# Patient Record
Sex: Female | Born: 1965 | Race: Black or African American | Hispanic: No | Marital: Single | Smoking: Former smoker
Health system: Southern US, Community
[De-identification: ages and names within clinical notes are randomized; demographics above are authoritative.]

## PROBLEM LIST (undated history)

## (undated) DIAGNOSIS — I214 Non-ST elevation (NSTEMI) myocardial infarction: Secondary | ICD-10-CM

## (undated) DIAGNOSIS — D649 Anemia, unspecified: Secondary | ICD-10-CM

## (undated) DIAGNOSIS — I420 Dilated cardiomyopathy: Secondary | ICD-10-CM

## (undated) DIAGNOSIS — Q054 Unspecified spina bifida with hydrocephalus: Secondary | ICD-10-CM

## (undated) DIAGNOSIS — I5043 Acute on chronic combined systolic (congestive) and diastolic (congestive) heart failure: Secondary | ICD-10-CM

## (undated) DIAGNOSIS — I639 Cerebral infarction, unspecified: Secondary | ICD-10-CM

## (undated) DIAGNOSIS — Z91199 Patient's noncompliance with other medical treatment and regimen due to unspecified reason: Secondary | ICD-10-CM

## (undated) DIAGNOSIS — Z9119 Patient's noncompliance with other medical treatment and regimen: Secondary | ICD-10-CM

## (undated) DIAGNOSIS — L659 Nonscarring hair loss, unspecified: Secondary | ICD-10-CM

## (undated) DIAGNOSIS — N939 Abnormal uterine and vaginal bleeding, unspecified: Secondary | ICD-10-CM

## (undated) DIAGNOSIS — I10 Essential (primary) hypertension: Secondary | ICD-10-CM

## (undated) DIAGNOSIS — E785 Hyperlipidemia, unspecified: Secondary | ICD-10-CM

## (undated) DIAGNOSIS — E876 Hypokalemia: Secondary | ICD-10-CM

## (undated) DIAGNOSIS — I5042 Chronic combined systolic (congestive) and diastolic (congestive) heart failure: Secondary | ICD-10-CM

## (undated) DIAGNOSIS — I209 Angina pectoris, unspecified: Secondary | ICD-10-CM

## (undated) DIAGNOSIS — I428 Other cardiomyopathies: Secondary | ICD-10-CM

## (undated) DIAGNOSIS — Z720 Tobacco use: Secondary | ICD-10-CM

## (undated) DIAGNOSIS — R7989 Other specified abnormal findings of blood chemistry: Secondary | ICD-10-CM

## (undated) DIAGNOSIS — O903 Peripartum cardiomyopathy: Secondary | ICD-10-CM

## (undated) DIAGNOSIS — G4730 Sleep apnea, unspecified: Secondary | ICD-10-CM

## (undated) DIAGNOSIS — N920 Excessive and frequent menstruation with regular cycle: Secondary | ICD-10-CM

## (undated) DIAGNOSIS — D219 Benign neoplasm of connective and other soft tissue, unspecified: Secondary | ICD-10-CM

## (undated) HISTORY — PX: TIBIAL TUBERCLERPLASTY: SHX5953

## (undated) HISTORY — DX: Essential (primary) hypertension: I10

## (undated) HISTORY — PX: INTRAUTERINE DEVICE (IUD) INSERTION: SHX5877

## (undated) HISTORY — DX: Hyperlipidemia, unspecified: E78.5

## (undated) HISTORY — DX: Other cardiomyopathies: I42.8

## (undated) HISTORY — DX: Unspecified spina bifida with hydrocephalus: Q05.4

## (undated) HISTORY — DX: Chronic combined systolic (congestive) and diastolic (congestive) heart failure: I50.42

## (undated) HISTORY — PX: OTHER SURGICAL HISTORY: SHX169

## (undated) HISTORY — DX: Tobacco use: Z72.0

## (undated) HISTORY — DX: Abnormal uterine and vaginal bleeding, unspecified: N93.9

## (undated) HISTORY — DX: Sleep apnea, unspecified: G47.30

## (undated) HISTORY — DX: Acute on chronic combined systolic (congestive) and diastolic (congestive) heart failure: I50.43

## (undated) HISTORY — DX: Hypokalemia: E87.6

## (undated) HISTORY — DX: Other specified abnormal findings of blood chemistry: R79.89

## (undated) HISTORY — DX: Dilated cardiomyopathy: I42.0

## (undated) HISTORY — PX: LOOP RECORDER IMPLANT: SHX5954

---

## 1992-05-24 DIAGNOSIS — O903 Peripartum cardiomyopathy: Secondary | ICD-10-CM

## 1992-05-24 HISTORY — DX: Peripartum cardiomyopathy: O90.3

## 1992-05-24 HISTORY — PX: TUBAL LIGATION: SHX77

## 1998-12-03 ENCOUNTER — Emergency Department (HOSPITAL_COMMUNITY): Admission: EM | Admit: 1998-12-03 | Discharge: 1998-12-03 | Payer: Self-pay | Admitting: Emergency Medicine

## 1999-02-01 ENCOUNTER — Emergency Department (HOSPITAL_COMMUNITY): Admission: EM | Admit: 1999-02-01 | Discharge: 1999-02-01 | Payer: Self-pay | Admitting: Emergency Medicine

## 1999-05-29 ENCOUNTER — Emergency Department (HOSPITAL_COMMUNITY): Admission: EM | Admit: 1999-05-29 | Discharge: 1999-05-29 | Payer: Self-pay | Admitting: Emergency Medicine

## 1999-06-02 ENCOUNTER — Emergency Department (HOSPITAL_COMMUNITY): Admission: EM | Admit: 1999-06-02 | Discharge: 1999-06-02 | Payer: Self-pay | Admitting: Emergency Medicine

## 1999-06-23 ENCOUNTER — Emergency Department (HOSPITAL_COMMUNITY): Admission: EM | Admit: 1999-06-23 | Discharge: 1999-06-23 | Payer: Self-pay | Admitting: *Deleted

## 1999-06-23 ENCOUNTER — Encounter: Payer: Self-pay | Admitting: *Deleted

## 1999-06-25 ENCOUNTER — Emergency Department (HOSPITAL_COMMUNITY): Admission: EM | Admit: 1999-06-25 | Discharge: 1999-06-25 | Payer: Self-pay | Admitting: Emergency Medicine

## 1999-08-22 ENCOUNTER — Emergency Department (HOSPITAL_COMMUNITY): Admission: EM | Admit: 1999-08-22 | Discharge: 1999-08-22 | Payer: Self-pay | Admitting: Emergency Medicine

## 1999-08-25 ENCOUNTER — Emergency Department (HOSPITAL_COMMUNITY): Admission: EM | Admit: 1999-08-25 | Discharge: 1999-08-25 | Payer: Self-pay | Admitting: Emergency Medicine

## 2000-02-17 ENCOUNTER — Emergency Department (HOSPITAL_COMMUNITY): Admission: EM | Admit: 2000-02-17 | Discharge: 2000-02-17 | Payer: Self-pay | Admitting: Emergency Medicine

## 2000-02-21 ENCOUNTER — Emergency Department (HOSPITAL_COMMUNITY): Admission: EM | Admit: 2000-02-21 | Discharge: 2000-02-21 | Payer: Self-pay | Admitting: Emergency Medicine

## 2000-05-28 ENCOUNTER — Emergency Department (HOSPITAL_COMMUNITY): Admission: EM | Admit: 2000-05-28 | Discharge: 2000-05-28 | Payer: Self-pay | Admitting: Emergency Medicine

## 2000-10-29 ENCOUNTER — Encounter: Payer: Self-pay | Admitting: Emergency Medicine

## 2000-10-29 ENCOUNTER — Emergency Department (HOSPITAL_COMMUNITY): Admission: EM | Admit: 2000-10-29 | Discharge: 2000-10-29 | Payer: Self-pay | Admitting: Emergency Medicine

## 2000-10-31 ENCOUNTER — Emergency Department (HOSPITAL_COMMUNITY): Admission: EM | Admit: 2000-10-31 | Discharge: 2000-11-01 | Payer: Self-pay | Admitting: Internal Medicine

## 2000-10-31 ENCOUNTER — Encounter: Payer: Self-pay | Admitting: Emergency Medicine

## 2000-12-13 ENCOUNTER — Encounter: Payer: Self-pay | Admitting: Emergency Medicine

## 2000-12-13 ENCOUNTER — Encounter: Payer: Self-pay | Admitting: Cardiology

## 2000-12-13 ENCOUNTER — Inpatient Hospital Stay (HOSPITAL_COMMUNITY): Admission: EM | Admit: 2000-12-13 | Discharge: 2000-12-15 | Payer: Self-pay | Admitting: Emergency Medicine

## 2000-12-14 ENCOUNTER — Encounter: Payer: Self-pay | Admitting: Cardiology

## 2000-12-15 ENCOUNTER — Encounter: Payer: Self-pay | Admitting: Cardiology

## 2000-12-20 ENCOUNTER — Encounter: Payer: Self-pay | Admitting: Cardiology

## 2000-12-20 ENCOUNTER — Ambulatory Visit (HOSPITAL_COMMUNITY): Admission: AD | Admit: 2000-12-20 | Discharge: 2000-12-20 | Payer: Self-pay | Admitting: Cardiology

## 2000-12-30 ENCOUNTER — Ambulatory Visit (HOSPITAL_COMMUNITY): Admission: RE | Admit: 2000-12-30 | Discharge: 2000-12-30 | Payer: Self-pay | Admitting: Internal Medicine

## 2001-01-13 ENCOUNTER — Ambulatory Visit (HOSPITAL_COMMUNITY): Admission: RE | Admit: 2001-01-13 | Discharge: 2001-01-13 | Payer: Self-pay | Admitting: Internal Medicine

## 2001-07-25 ENCOUNTER — Emergency Department (HOSPITAL_COMMUNITY): Admission: EM | Admit: 2001-07-25 | Discharge: 2001-07-25 | Payer: Self-pay | Admitting: Emergency Medicine

## 2001-07-25 ENCOUNTER — Encounter: Payer: Self-pay | Admitting: Emergency Medicine

## 2001-11-06 ENCOUNTER — Emergency Department (HOSPITAL_COMMUNITY): Admission: EM | Admit: 2001-11-06 | Discharge: 2001-11-06 | Payer: Self-pay | Admitting: Emergency Medicine

## 2002-04-27 ENCOUNTER — Emergency Department (HOSPITAL_COMMUNITY): Admission: EM | Admit: 2002-04-27 | Discharge: 2002-04-27 | Payer: Self-pay | Admitting: Emergency Medicine

## 2002-05-13 ENCOUNTER — Emergency Department (HOSPITAL_COMMUNITY): Admission: EM | Admit: 2002-05-13 | Discharge: 2002-05-13 | Payer: Self-pay | Admitting: Emergency Medicine

## 2002-11-06 ENCOUNTER — Emergency Department (HOSPITAL_COMMUNITY): Admission: EM | Admit: 2002-11-06 | Discharge: 2002-11-06 | Payer: Self-pay | Admitting: Emergency Medicine

## 2003-01-27 ENCOUNTER — Emergency Department (HOSPITAL_COMMUNITY): Admission: EM | Admit: 2003-01-27 | Discharge: 2003-01-27 | Payer: Self-pay | Admitting: Emergency Medicine

## 2003-02-03 ENCOUNTER — Encounter: Payer: Self-pay | Admitting: Internal Medicine

## 2003-02-03 ENCOUNTER — Encounter: Payer: Self-pay | Admitting: Emergency Medicine

## 2003-02-03 ENCOUNTER — Inpatient Hospital Stay (HOSPITAL_COMMUNITY): Admission: EM | Admit: 2003-02-03 | Discharge: 2003-02-04 | Payer: Self-pay | Admitting: Internal Medicine

## 2003-02-04 ENCOUNTER — Encounter: Payer: Self-pay | Admitting: Cardiology

## 2003-02-06 ENCOUNTER — Ambulatory Visit (HOSPITAL_COMMUNITY): Admission: RE | Admit: 2003-02-06 | Discharge: 2003-02-07 | Payer: Self-pay | Admitting: Internal Medicine

## 2003-03-25 ENCOUNTER — Ambulatory Visit (HOSPITAL_COMMUNITY): Admission: RE | Admit: 2003-03-25 | Discharge: 2003-03-25 | Payer: Self-pay | Admitting: Internal Medicine

## 2003-04-02 ENCOUNTER — Inpatient Hospital Stay (HOSPITAL_COMMUNITY): Admission: EM | Admit: 2003-04-02 | Discharge: 2003-04-03 | Payer: Self-pay | Admitting: Emergency Medicine

## 2003-05-09 ENCOUNTER — Emergency Department (HOSPITAL_COMMUNITY): Admission: AD | Admit: 2003-05-09 | Discharge: 2003-05-09 | Payer: Self-pay | Admitting: Family Medicine

## 2003-08-25 ENCOUNTER — Emergency Department (HOSPITAL_COMMUNITY): Admission: AD | Admit: 2003-08-25 | Discharge: 2003-08-25 | Payer: Self-pay | Admitting: Family Medicine

## 2003-11-13 ENCOUNTER — Emergency Department (HOSPITAL_COMMUNITY): Admission: EM | Admit: 2003-11-13 | Discharge: 2003-11-13 | Payer: Self-pay | Admitting: Family Medicine

## 2004-03-01 ENCOUNTER — Emergency Department (HOSPITAL_COMMUNITY): Admission: EM | Admit: 2004-03-01 | Discharge: 2004-03-01 | Payer: Self-pay | Admitting: *Deleted

## 2004-03-01 ENCOUNTER — Emergency Department (HOSPITAL_COMMUNITY): Admission: EM | Admit: 2004-03-01 | Discharge: 2004-03-01 | Payer: Self-pay | Admitting: Emergency Medicine

## 2004-03-29 ENCOUNTER — Emergency Department (HOSPITAL_COMMUNITY): Admission: EM | Admit: 2004-03-29 | Discharge: 2004-03-29 | Payer: Self-pay | Admitting: Emergency Medicine

## 2004-07-27 ENCOUNTER — Emergency Department (HOSPITAL_COMMUNITY): Admission: EM | Admit: 2004-07-27 | Discharge: 2004-07-27 | Payer: Self-pay | Admitting: Emergency Medicine

## 2004-07-28 ENCOUNTER — Ambulatory Visit: Payer: Self-pay | Admitting: Internal Medicine

## 2004-08-06 ENCOUNTER — Ambulatory Visit: Payer: Self-pay

## 2004-09-07 ENCOUNTER — Emergency Department (HOSPITAL_COMMUNITY): Admission: EM | Admit: 2004-09-07 | Discharge: 2004-09-07 | Payer: Self-pay | Admitting: *Deleted

## 2004-12-10 ENCOUNTER — Emergency Department (HOSPITAL_COMMUNITY): Admission: EM | Admit: 2004-12-10 | Discharge: 2004-12-10 | Payer: Self-pay | Admitting: Emergency Medicine

## 2005-04-06 ENCOUNTER — Emergency Department (HOSPITAL_COMMUNITY): Admission: EM | Admit: 2005-04-06 | Discharge: 2005-04-06 | Payer: Self-pay | Admitting: Emergency Medicine

## 2005-05-08 ENCOUNTER — Emergency Department (HOSPITAL_COMMUNITY): Admission: EM | Admit: 2005-05-08 | Discharge: 2005-05-08 | Payer: Self-pay | Admitting: Emergency Medicine

## 2005-06-30 ENCOUNTER — Emergency Department (HOSPITAL_COMMUNITY): Admission: EM | Admit: 2005-06-30 | Discharge: 2005-06-30 | Payer: Self-pay | Admitting: Emergency Medicine

## 2005-07-15 ENCOUNTER — Emergency Department (HOSPITAL_COMMUNITY): Admission: EM | Admit: 2005-07-15 | Discharge: 2005-07-15 | Payer: Self-pay | Admitting: Emergency Medicine

## 2005-09-02 ENCOUNTER — Emergency Department (HOSPITAL_COMMUNITY): Admission: EM | Admit: 2005-09-02 | Discharge: 2005-09-02 | Payer: Self-pay | Admitting: Family Medicine

## 2005-10-29 ENCOUNTER — Ambulatory Visit: Payer: Self-pay | Admitting: Internal Medicine

## 2005-11-08 ENCOUNTER — Emergency Department (HOSPITAL_COMMUNITY): Admission: EM | Admit: 2005-11-08 | Discharge: 2005-11-08 | Payer: Self-pay | Admitting: Family Medicine

## 2005-12-13 ENCOUNTER — Emergency Department (HOSPITAL_COMMUNITY): Admission: EM | Admit: 2005-12-13 | Discharge: 2005-12-13 | Payer: Self-pay | Admitting: Emergency Medicine

## 2006-01-11 ENCOUNTER — Emergency Department (HOSPITAL_COMMUNITY): Admission: EM | Admit: 2006-01-11 | Discharge: 2006-01-12 | Payer: Self-pay | Admitting: Emergency Medicine

## 2006-02-24 ENCOUNTER — Emergency Department (HOSPITAL_COMMUNITY): Admission: EM | Admit: 2006-02-24 | Discharge: 2006-02-24 | Payer: Self-pay | Admitting: Family Medicine

## 2006-03-07 ENCOUNTER — Emergency Department (HOSPITAL_COMMUNITY): Admission: EM | Admit: 2006-03-07 | Discharge: 2006-03-07 | Payer: Self-pay | Admitting: Emergency Medicine

## 2006-04-07 ENCOUNTER — Emergency Department (HOSPITAL_COMMUNITY): Admission: EM | Admit: 2006-04-07 | Discharge: 2006-04-07 | Payer: Self-pay | Admitting: Emergency Medicine

## 2006-05-29 ENCOUNTER — Emergency Department (HOSPITAL_COMMUNITY): Admission: EM | Admit: 2006-05-29 | Discharge: 2006-05-29 | Payer: Self-pay | Admitting: Emergency Medicine

## 2006-07-16 ENCOUNTER — Emergency Department (HOSPITAL_COMMUNITY): Admission: EM | Admit: 2006-07-16 | Discharge: 2006-07-16 | Payer: Self-pay | Admitting: *Deleted

## 2006-07-27 ENCOUNTER — Emergency Department (HOSPITAL_COMMUNITY): Admission: EM | Admit: 2006-07-27 | Discharge: 2006-07-27 | Payer: Self-pay | Admitting: Family Medicine

## 2006-11-20 ENCOUNTER — Emergency Department (HOSPITAL_COMMUNITY): Admission: EM | Admit: 2006-11-20 | Discharge: 2006-11-20 | Payer: Self-pay | Admitting: Emergency Medicine

## 2007-01-14 ENCOUNTER — Emergency Department (HOSPITAL_COMMUNITY): Admission: EM | Admit: 2007-01-14 | Discharge: 2007-01-14 | Payer: Self-pay | Admitting: Emergency Medicine

## 2007-01-19 ENCOUNTER — Emergency Department (HOSPITAL_COMMUNITY): Admission: EM | Admit: 2007-01-19 | Discharge: 2007-01-19 | Payer: Self-pay | Admitting: Emergency Medicine

## 2007-02-03 ENCOUNTER — Ambulatory Visit: Payer: Self-pay | Admitting: Internal Medicine

## 2007-02-10 ENCOUNTER — Encounter: Payer: Self-pay | Admitting: Internal Medicine

## 2007-02-10 ENCOUNTER — Ambulatory Visit: Payer: Self-pay

## 2007-08-24 ENCOUNTER — Emergency Department (HOSPITAL_COMMUNITY): Admission: EM | Admit: 2007-08-24 | Discharge: 2007-08-24 | Payer: Self-pay | Admitting: Emergency Medicine

## 2007-08-28 ENCOUNTER — Emergency Department (HOSPITAL_COMMUNITY): Admission: EM | Admit: 2007-08-28 | Discharge: 2007-08-28 | Payer: Self-pay | Admitting: Emergency Medicine

## 2007-09-27 ENCOUNTER — Emergency Department (HOSPITAL_COMMUNITY): Admission: EM | Admit: 2007-09-27 | Discharge: 2007-09-27 | Payer: Self-pay | Admitting: Emergency Medicine

## 2008-02-27 ENCOUNTER — Emergency Department (HOSPITAL_COMMUNITY): Admission: EM | Admit: 2008-02-27 | Discharge: 2008-02-27 | Payer: Self-pay | Admitting: Emergency Medicine

## 2008-03-06 ENCOUNTER — Emergency Department (HOSPITAL_COMMUNITY): Admission: EM | Admit: 2008-03-06 | Discharge: 2008-03-06 | Payer: Self-pay | Admitting: Emergency Medicine

## 2008-08-08 ENCOUNTER — Emergency Department (HOSPITAL_COMMUNITY): Admission: EM | Admit: 2008-08-08 | Discharge: 2008-08-08 | Payer: Self-pay | Admitting: Emergency Medicine

## 2008-09-06 ENCOUNTER — Telehealth (INDEPENDENT_AMBULATORY_CARE_PROVIDER_SITE_OTHER): Payer: Self-pay | Admitting: *Deleted

## 2008-09-30 ENCOUNTER — Emergency Department (HOSPITAL_COMMUNITY): Admission: EM | Admit: 2008-09-30 | Discharge: 2008-09-30 | Payer: Self-pay | Admitting: Family Medicine

## 2008-10-13 ENCOUNTER — Emergency Department (HOSPITAL_COMMUNITY): Admission: EM | Admit: 2008-10-13 | Discharge: 2008-10-13 | Payer: Self-pay | Admitting: Emergency Medicine

## 2008-10-15 DIAGNOSIS — I10 Essential (primary) hypertension: Secondary | ICD-10-CM | POA: Insufficient documentation

## 2008-10-15 DIAGNOSIS — I080 Rheumatic disorders of both mitral and aortic valves: Secondary | ICD-10-CM

## 2008-10-23 ENCOUNTER — Encounter (INDEPENDENT_AMBULATORY_CARE_PROVIDER_SITE_OTHER): Payer: Self-pay | Admitting: *Deleted

## 2008-11-10 ENCOUNTER — Emergency Department (HOSPITAL_COMMUNITY): Admission: EM | Admit: 2008-11-10 | Discharge: 2008-11-10 | Payer: Self-pay | Admitting: Emergency Medicine

## 2008-12-12 ENCOUNTER — Telehealth: Payer: Self-pay | Admitting: Internal Medicine

## 2008-12-13 ENCOUNTER — Ambulatory Visit: Payer: Self-pay

## 2009-01-08 ENCOUNTER — Encounter (INDEPENDENT_AMBULATORY_CARE_PROVIDER_SITE_OTHER): Payer: Self-pay | Admitting: *Deleted

## 2009-01-22 ENCOUNTER — Emergency Department (HOSPITAL_COMMUNITY): Admission: EM | Admit: 2009-01-22 | Discharge: 2009-01-22 | Payer: Self-pay | Admitting: Emergency Medicine

## 2009-02-18 ENCOUNTER — Emergency Department (HOSPITAL_COMMUNITY): Admission: EM | Admit: 2009-02-18 | Discharge: 2009-02-18 | Payer: Self-pay | Admitting: Family Medicine

## 2009-02-27 ENCOUNTER — Emergency Department (HOSPITAL_COMMUNITY): Admission: EM | Admit: 2009-02-27 | Discharge: 2009-02-27 | Payer: Self-pay | Admitting: Emergency Medicine

## 2009-05-24 DIAGNOSIS — I639 Cerebral infarction, unspecified: Secondary | ICD-10-CM

## 2009-05-24 HISTORY — DX: Cerebral infarction, unspecified: I63.9

## 2009-09-01 ENCOUNTER — Telehealth: Payer: Self-pay | Admitting: Internal Medicine

## 2009-09-06 ENCOUNTER — Observation Stay (HOSPITAL_COMMUNITY): Admission: EM | Admit: 2009-09-06 | Discharge: 2009-09-07 | Payer: Self-pay | Admitting: Emergency Medicine

## 2009-09-06 ENCOUNTER — Encounter (INDEPENDENT_AMBULATORY_CARE_PROVIDER_SITE_OTHER): Payer: Self-pay | Admitting: Internal Medicine

## 2009-09-07 ENCOUNTER — Encounter (INDEPENDENT_AMBULATORY_CARE_PROVIDER_SITE_OTHER): Payer: Self-pay | Admitting: Internal Medicine

## 2009-09-07 DIAGNOSIS — E876 Hypokalemia: Secondary | ICD-10-CM

## 2009-09-13 ENCOUNTER — Emergency Department (HOSPITAL_COMMUNITY): Admission: EM | Admit: 2009-09-13 | Discharge: 2009-09-13 | Payer: Self-pay | Admitting: Emergency Medicine

## 2009-10-06 ENCOUNTER — Ambulatory Visit: Payer: Self-pay | Admitting: Internal Medicine

## 2009-10-06 DIAGNOSIS — I509 Heart failure, unspecified: Secondary | ICD-10-CM | POA: Insufficient documentation

## 2009-10-07 LAB — CONVERTED CEMR LAB
BUN: 8 mg/dL (ref 6–23)
CO2: 30 meq/L (ref 19–32)
Calcium: 9 mg/dL (ref 8.4–10.5)
Creatinine, Ser: 0.7 mg/dL (ref 0.4–1.2)
Glucose, Bld: 84 mg/dL (ref 70–99)
Sodium: 142 meq/L (ref 135–145)

## 2009-10-24 ENCOUNTER — Encounter: Payer: Self-pay | Admitting: Internal Medicine

## 2009-10-24 ENCOUNTER — Ambulatory Visit: Payer: Self-pay | Admitting: Cardiovascular Disease

## 2009-10-24 ENCOUNTER — Ambulatory Visit: Payer: Self-pay

## 2009-10-24 ENCOUNTER — Ambulatory Visit (HOSPITAL_COMMUNITY): Admission: RE | Admit: 2009-10-24 | Discharge: 2009-10-24 | Payer: Self-pay | Admitting: Internal Medicine

## 2009-10-29 ENCOUNTER — Encounter (INDEPENDENT_AMBULATORY_CARE_PROVIDER_SITE_OTHER): Payer: Self-pay | Admitting: *Deleted

## 2009-10-29 ENCOUNTER — Telehealth: Payer: Self-pay | Admitting: Internal Medicine

## 2009-11-02 ENCOUNTER — Emergency Department (HOSPITAL_COMMUNITY): Admission: EM | Admit: 2009-11-02 | Discharge: 2009-11-02 | Payer: Self-pay | Admitting: Emergency Medicine

## 2009-11-03 ENCOUNTER — Telehealth: Payer: Self-pay | Admitting: Internal Medicine

## 2009-11-04 ENCOUNTER — Telehealth: Payer: Self-pay | Admitting: Internal Medicine

## 2009-11-07 ENCOUNTER — Encounter: Payer: Self-pay | Admitting: Internal Medicine

## 2009-11-13 ENCOUNTER — Telehealth: Payer: Self-pay | Admitting: Internal Medicine

## 2009-11-16 ENCOUNTER — Emergency Department (HOSPITAL_COMMUNITY): Admission: EM | Admit: 2009-11-16 | Discharge: 2009-11-16 | Payer: Self-pay | Admitting: Family Medicine

## 2009-11-18 ENCOUNTER — Emergency Department (HOSPITAL_COMMUNITY): Admission: EM | Admit: 2009-11-18 | Discharge: 2009-11-18 | Payer: Self-pay | Admitting: Family Medicine

## 2010-01-10 ENCOUNTER — Emergency Department (HOSPITAL_COMMUNITY): Admission: EM | Admit: 2010-01-10 | Discharge: 2010-01-10 | Payer: Self-pay | Admitting: Family Medicine

## 2010-01-27 ENCOUNTER — Emergency Department (HOSPITAL_COMMUNITY): Admission: EM | Admit: 2010-01-27 | Discharge: 2010-01-27 | Payer: Self-pay | Admitting: Emergency Medicine

## 2010-02-16 ENCOUNTER — Observation Stay (HOSPITAL_COMMUNITY): Admission: EM | Admit: 2010-02-16 | Discharge: 2010-02-16 | Payer: Self-pay | Admitting: Emergency Medicine

## 2010-02-16 ENCOUNTER — Ambulatory Visit: Payer: Self-pay | Admitting: Cardiovascular Disease

## 2010-02-17 ENCOUNTER — Emergency Department (HOSPITAL_COMMUNITY): Admission: EM | Admit: 2010-02-17 | Discharge: 2010-02-17 | Payer: Self-pay | Admitting: Emergency Medicine

## 2010-02-20 ENCOUNTER — Ambulatory Visit: Payer: Self-pay | Admitting: Internal Medicine

## 2010-03-02 ENCOUNTER — Emergency Department (HOSPITAL_COMMUNITY): Admission: EM | Admit: 2010-03-02 | Discharge: 2010-03-02 | Payer: Self-pay | Admitting: Emergency Medicine

## 2010-03-02 ENCOUNTER — Ambulatory Visit: Payer: Self-pay | Admitting: Internal Medicine

## 2010-03-02 LAB — CONVERTED CEMR LAB
BUN: 6 mg/dL (ref 6–23)
Calcium: 8.7 mg/dL (ref 8.4–10.5)
Chloride: 106 meq/L (ref 96–112)
Creatinine, Ser: 0.7 mg/dL (ref 0.4–1.2)

## 2010-03-19 ENCOUNTER — Encounter (INDEPENDENT_AMBULATORY_CARE_PROVIDER_SITE_OTHER): Payer: Self-pay | Admitting: *Deleted

## 2010-03-19 LAB — CONVERTED CEMR LAB
AST: 12 units/L (ref 0–37)
Albumin: 3.7 g/dL (ref 3.5–5.2)
Alkaline Phosphatase: 77 units/L (ref 39–117)
Basophils Relative: 1 % (ref 0–1)
Eosinophils Absolute: 0.1 10*3/uL (ref 0.0–0.7)
Lymphs Abs: 3 10*3/uL (ref 0.7–4.0)
MCHC: 29.8 g/dL — ABNORMAL LOW (ref 30.0–36.0)
MCV: 70.1 fL — ABNORMAL LOW (ref 78.0–100.0)
Neutrophils Relative %: 44 % (ref 43–77)
Platelets: 308 10*3/uL (ref 150–400)
Potassium: 4.1 meq/L (ref 3.5–5.3)
Sodium: 144 meq/L (ref 135–145)
Total Protein: 6.7 g/dL (ref 6.0–8.3)
WBC: 6.3 10*3/uL (ref 4.0–10.5)

## 2010-04-27 ENCOUNTER — Emergency Department (HOSPITAL_COMMUNITY)
Admission: EM | Admit: 2010-04-27 | Discharge: 2010-04-27 | Payer: Self-pay | Source: Home / Self Care | Admitting: Emergency Medicine

## 2010-04-30 ENCOUNTER — Emergency Department (HOSPITAL_COMMUNITY): Admission: EM | Admit: 2010-04-30 | Discharge: 2010-03-14 | Payer: Self-pay | Admitting: Emergency Medicine

## 2010-05-14 ENCOUNTER — Ambulatory Visit: Payer: Self-pay | Admitting: Internal Medicine

## 2010-05-17 ENCOUNTER — Inpatient Hospital Stay (HOSPITAL_COMMUNITY): Admission: EM | Admit: 2010-05-17 | Discharge: 2010-05-19 | Payer: Self-pay | Source: Home / Self Care

## 2010-05-19 ENCOUNTER — Encounter (INDEPENDENT_AMBULATORY_CARE_PROVIDER_SITE_OTHER): Payer: Self-pay | Admitting: Internal Medicine

## 2010-05-20 ENCOUNTER — Encounter: Payer: Self-pay | Admitting: Internal Medicine

## 2010-05-29 ENCOUNTER — Encounter: Payer: Self-pay | Admitting: Internal Medicine

## 2010-06-08 ENCOUNTER — Encounter: Payer: Self-pay | Admitting: Physician Assistant

## 2010-06-08 ENCOUNTER — Ambulatory Visit
Admission: RE | Admit: 2010-06-08 | Discharge: 2010-06-08 | Payer: Self-pay | Source: Home / Self Care | Attending: Physician Assistant | Admitting: Physician Assistant

## 2010-06-08 DIAGNOSIS — Z8679 Personal history of other diseases of the circulatory system: Secondary | ICD-10-CM | POA: Insufficient documentation

## 2010-06-08 DIAGNOSIS — E785 Hyperlipidemia, unspecified: Secondary | ICD-10-CM | POA: Insufficient documentation

## 2010-06-08 DIAGNOSIS — E739 Lactose intolerance, unspecified: Secondary | ICD-10-CM | POA: Insufficient documentation

## 2010-06-08 DIAGNOSIS — Q054 Unspecified spina bifida with hydrocephalus: Secondary | ICD-10-CM | POA: Insufficient documentation

## 2010-06-08 DIAGNOSIS — I671 Cerebral aneurysm, nonruptured: Secondary | ICD-10-CM | POA: Insufficient documentation

## 2010-06-08 HISTORY — DX: Unspecified spina bifida with hydrocephalus: Q05.4

## 2010-06-09 ENCOUNTER — Telehealth: Payer: Self-pay | Admitting: Internal Medicine

## 2010-06-15 ENCOUNTER — Other Ambulatory Visit: Payer: Self-pay | Admitting: Physician Assistant

## 2010-06-15 ENCOUNTER — Ambulatory Visit
Admission: RE | Admit: 2010-06-15 | Discharge: 2010-06-15 | Payer: Self-pay | Source: Home / Self Care | Attending: Physician Assistant | Admitting: Physician Assistant

## 2010-06-16 ENCOUNTER — Telehealth: Payer: Self-pay | Admitting: Physician Assistant

## 2010-06-16 LAB — BASIC METABOLIC PANEL
BUN: 9 mg/dL (ref 6–23)
CO2: 30 mEq/L (ref 19–32)
Chloride: 106 mEq/L (ref 96–112)
Glucose, Bld: 92 mg/dL (ref 70–99)
Potassium: 3.2 mEq/L — ABNORMAL LOW (ref 3.5–5.1)

## 2010-06-23 ENCOUNTER — Ambulatory Visit: Admit: 2010-06-23 | Payer: Self-pay | Admitting: Physician Assistant

## 2010-06-23 NOTE — Progress Notes (Signed)
Summary: calling regarding medication cost  Phone Note Call from Patient Call back at Home Phone [phone]   Caller: Patient Summary of Call: Pt calling back regarding her medication  Initial call taken by: Judie Grieve,  November 04, 2009 2:49 PM  Follow-up for Phone Call        Spoke with patient.  Carvedilol is $4/month, but losartan is $42/month.  Pt states she cannot afford that.  Will D/W Dr Graciela Husbands alternatives for ARB therapy. Gypsy Balsam RN BSN  November 04, 2009 3:47 PM   Additional Follow-up for Phone Call Additional follow up Details #1::        Change Losartan to Hydralazine 10mg  three times a day and Isosorbide Mn 30mg  daily per Dr Graciela Husbands. Gypsy Balsam RN BSN  November 05, 2009 2:15 PM  Per Nicolette Bang, both of these are on $4 list.  Pt's phone disconnected, RX sent in and letter mailed. Gypsy Balsam RN BSN  November 07, 2009 12:16 PM     New/Updated Medications: HYDRALAZINE HCL 10 MG TABS (HYDRALAZINE HCL) Take one tablet by mouth three times a day ISOSORBIDE MONONITRATE CR 30 MG XR24H-TAB (ISOSORBIDE MONONITRATE) Take one tablet by mouth daily Prescriptions: ISOSORBIDE MONONITRATE CR 30 MG XR24H-TAB (ISOSORBIDE MONONITRATE) Take one tablet by mouth daily  #30 x 11   Entered by:   Optometrist BSN   Authorized by:   Nathen May, MD, [facility]   Signed by:   Gypsy Balsam RN BSN on 11/07/2009   Method used:   Electronically to        [facility] (retail)       [address]       [place], [place]  [address]       Ph: [phone]       Fax: [phone]   RxID:   [id number] HYDRALAZINE HCL 10 MG TABS (HYDRALAZINE HCL) Take one tablet by mouth three times a day  #90 x 11   Entered by:   Optometrist BSN   Authorized by:   Nathen May, MD, [facility]   Signed by:   Gypsy Balsam RN BSN on 11/07/2009   Method used:   Electronically to        [facility] (retail)       [address]       [place], [place]  [address]       Ph: [phone]       Fax: [phone]   RxID:   [id number]

## 2010-06-23 NOTE — Progress Notes (Signed)
Summary: samples  Phone Note Call from Patient Call back at Home Phone [phone]   Caller: Patient Reason for Call: Talk to Nurse Complaint: Nausea/Vomiting/Diarrhea Summary of Call: pt request samples of Hydralazine and Isosorbide...Marland KitchenMarland Kitchen states they cost after 60.00 and she can not afford that Initial call taken by: Migdalia Dk,  November 13, 2009 9:13 AM  Follow-up for Phone Call        PT AWARE  MEDS ARE EITHER 4 OR 10 DOLLAR MEDS WILL PICK UP MEDS TODAY PER PHARMACISTS LOSARTAN WAS $42.OO PT WAS CHANGED TO HYDRALAZINE DUE TO COST .PHARMACY AWARE LOSARTAN WAS STOPPED .PER  PT HAS NOT STARTED ANY MEDS AS OF TODAY . INFORMED NEEDED TO START VERBALZIED UNDERSTANDING. WILL PICKUP TODAY Follow-up by: Scherrie Bateman, LPN,  November 13, 2009 10:12 AM    Prescriptions: CARVEDILOL 6.25 MG TABS (CARVEDILOL) Take one tablet by mouth twice a day  #60 x 11   Entered by:   Scherrie Bateman, LPN   Authorized by:   Nathen May, MD, [facility]   Signed by:   Scherrie Bateman, LPN on 09/81/1914   Method used:   Electronically to        [facility] (retail)       [address]       [place], [place]  [address]       Ph: [phone]       Fax: [phone]   RxID:   [id number]

## 2010-06-23 NOTE — Progress Notes (Signed)
Summary: med changes  Phone Note Outgoing Call Call back at [facility] Phone [phone]   Call placed by: Gypsy Balsam RN BSN,  November 04, 2009 2:06 PM Summary of Call: Spoke with patient regarding echo results.  Per Dr Odessa Fleming recommendations, Metoprolol changed to Carvedilol 6.25mg  1 tablet two times a day, added Losartan 25mg  daily.  Pt's allergy to ACE-I is a cough.  Appt made to follow-up 12-09-09 with Dr Graciela Husbands.  Prescriptions sent to [facility] on [address]. Gypsy Balsam RN BSN  November 04, 2009 2:07 PM     New/Updated Medications: CARVEDILOL 6.25 MG TABS (CARVEDILOL) Take one tablet by mouth twice a day LOSARTAN POTASSIUM 25 MG TABS (LOSARTAN POTASSIUM) Take 1 tablet by mouth once a day Prescriptions: LOSARTAN POTASSIUM 25 MG TABS (LOSARTAN POTASSIUM) Take 1 tablet by mouth once a day  #30 x 11   Entered by:   Optometrist BSN   Authorized by:   Nathen May, MD, [facility]   Signed by:   Gypsy Balsam RN BSN on 11/04/2009   Method used:   Electronically to        [facility] (retail)       [address]       [place], [place]  [address]       Ph: [phone]       Fax: [phone]   RxID:   [id number] CARVEDILOL 6.25 MG TABS (CARVEDILOL) Take one tablet by mouth twice a day  #60 x 11   Entered by:   Optometrist BSN   Authorized by:   Nathen May, MD, [facility]   Signed by:   Gypsy Balsam RN BSN on 11/04/2009   Method used:   Electronically to        [facility] (retail)       [address]       [place], [place]  [address]       Ph: [phone]       Fax: [phone]   RxID:   [id number]

## 2010-06-23 NOTE — Progress Notes (Signed)
Summary: Unable to reach Patient: Mailed letter  Phone Note Outgoing Call   Summary of Call: RN attempted to Home Phone [phone] - Cricket disconnected;  Work Phone [phone] - Person answered stated wrong number; Relative listed: [phone] - telephone did not ring/no answering machine. Will mail letter to contact office. Bernita Raisin, RN, BSN  October 29, 2009 12:44 PM

## 2010-06-23 NOTE — Progress Notes (Signed)
Summary: pt calling regarding letter she got  Phone Note Call from Patient Call back at Home Phone [phone]   Caller: Patient Reason for Call: Talk to Nurse, Talk to Doctor Summary of Call: pt regarding letter she got from you regarding her echo Initial call taken by: Omer Jack,  November 03, 2009 2:59 PM  Follow-up for Phone Call        11/03/09--1730--spoke with ms Uliano who can now be reached at [phone]--in reference to letter about echo--you wanted her switched to ARB, d/c toprol and start carvedilol--? what ARB--please call pt--she has her phone back on--nt Follow-up by: Ledon Snare, RN,  November 03, 2009 5:51 PM

## 2010-06-23 NOTE — Letter (Signed)
Summary: Generic Letter  [facility], Main Office  [address]   [place], [place] [address]   Phone: [phone]  Fax: [phone]        November 07, 2009 MRN: [id number]    [facility] [address] [place], [place]  [address]    Dear Chelsea Branch,   I spoke with Dr Graciela Husbands about the cost of the Losartan prescription.  Since you won't be able to take that one, he wants you to take Hydralazine 10mg  1 tablet three times daily and Isosorbide Mn 30mg  1 tablet daily.  I called [facility] and both of these are on the $4 list.  I went ahead and sent these prescriptions in for you.  Please call me with questions.       Sincerely,  Chelsea Balsam RN BSN  This letter has been electronically signed by your physician.

## 2010-06-23 NOTE — Miscellaneous (Signed)
Summary: hosp d/c  Hospital Discharge  Date of admission: 09/06/09   Date of discharge: 09/07/09  Brief reason for admission/active problems: Pt is a 45 yo female with history of postpartum cardiomyopathy and HTN who was brought to ED by son for worsening cough x3 weeks. Pt had been complaining of SOB and 2 pillow orthopnea during this time as well. Cough likely exacerbated by recent ACEI use and postnasal drip following a URI. She was given lasix on admission after CXR showed pulmonary congestion and  Her breathing and coughed improved. She will be discharged on Lasix 20mg  daily as well as KCl daily.  Of note, pt had symptomatic pyruia and was started on cipro 500mg  by mouth twice daily for three days. Her dysuria had improved by discharge.  Followup needed: Pt will be called by outpt clinic for a followup appt in 7-10 days. She has a scheduled repeat BMET at this time. Recommend ob/gyn referral given history of menorrhagia and anemia. CBC is recommended in 1 month if ob/gyn appt not able to be scheduled within a month. Hgb at discharge 8.3  The medication and problem lists have been updated.  Please see the dictated discharge summary for details.    Patient Instructions: 1)  The outpatient clinic will call you for an appointment. It is very important that you keep this appointment. It is also very important that you follow up with your cardiologist, Dr. Graciela Husbands, on May 16 at 12 pm. 2)  We have made a few changes to your medications. Please see attached medication list.  3)  If you have any questions or  concerns prior to follow up appt, please call outpt clinic at [phone].  Appended Document: hosp d/c Prescriptions: FERROUS SULFATE 325 (65 FE) MG TABS (FERROUS SULFATE) 1 tab twice daily  #60 x 3   Entered and Authorized by:   Brooks Sailors MD   Signed by:   Brooks Sailors MD on 09/07/2009   Method used:   Handwritten   RxID:   [id number] POTASSIUM CHLORIDE 20 MEQ PACK (POTASSIUM  CHLORIDE) 2 tablets by mouth daily  #60 x 3   Entered and Authorized by:   Brooks Sailors MD   Signed by:   Brooks Sailors MD on 09/07/2009   Method used:   Handwritten   RxID:   [id number] HYDROCHLOROTHIAZIDE 12.5 MG TABS (HYDROCHLOROTHIAZIDE) 1 tablet by mouth daily  #30 x 3   Entered and Authorized by:   Brooks Sailors MD   Signed by:   Brooks Sailors MD on 09/07/2009   Method used:   Handwritten   RxID:   [id number] LASIX 20 MG TABS (FUROSEMIDE) 1 tablet by mouth daily  #30 x 3   Entered and Authorized by:   Brooks Sailors MD   Signed by:   Brooks Sailors MD on 09/07/2009   Method used:   Handwritten   RxID:   [id number] CIPROFLOXACIN HCL 500 MG TABS (CIPROFLOXACIN HCL) Please take 1 tablet twice daily for 2 days  #4 x 0   Entered and Authorized by:   Brooks Sailors MD   Signed by:   Brooks Sailors MD on 09/07/2009   Method used:   Handwritten   RxID:   [id number]

## 2010-06-23 NOTE — Letter (Signed)
Summary: Results Follow-up  [facility], Main Office  [address]   [place], [place] [address]   Phone: [phone]  Fax: [phone]     October 29, 2009    [facility] [address] [place], [place]  [address]   Dear Ms. Chapple,  We have received the results from your recent tests and have been unable to contact you.  Please call our office at the number listed above so that Dr.  Graciela Husbands or his nurse may review the results with you.    Thank you,  [facility]

## 2010-06-23 NOTE — Progress Notes (Signed)
Summary: b/p high today  Phone Note Call from Patient Call back at Home Phone [phone] Call back at [phone]   Caller: Patient Reason for Call: Talk to Nurse Summary of Call: c/o blood pressure high today ,154/90 @ [facility]. out of meds.  Initial call taken by: Lorne Skeens,  September 01, 2009 2:09 PM  Follow-up for Phone Call        SUPPLY OF MEDS PHONED INTO [facility] [address]  PT INFORMED NEEDS TO KEEP APPT WITH DR Graciela Husbands IN ORDER FOR [place] TO FILL MEDS VERBALIZED UNDERSTANDING . Follow-up by: Scherrie Bateman, LPN,  September 01, 2009 2:21 PM  Additional Follow-up for Phone Call Additional follow up Details #1::        thx Additional Follow-up by: Nathen May, MD, [facility],  September 01, 2009 5:31 PM    Prescriptions: METOPROLOL TARTRATE 50 MG TABS (METOPROLOL TARTRATE) take 1 tab two times a day  #60 x 1   Entered by:   Scherrie Bateman, LPN   Authorized by:   Nathen May, MD, [facility]   Signed by:   Scherrie Bateman, LPN on 56/43/3295   Method used:   Electronically to        [facility] (retail)       [address]       [place], [place]  [address]       Ph: [phone]       Fax: [phone]   RxID:   [id number] LISINOPRIL-HYDROCHLOROTHIAZIDE 20-12.5 MG TABS (LISINOPRIL-HYDROCHLOROTHIAZIDE) 1 tab once daily  #30 Each x 1   Entered by:   Scherrie Bateman, LPN   Authorized by:   Nathen May, MD, [facility]   Signed by:   Scherrie Bateman, LPN on 15/17/6160   Method used:   Electronically to        [facility] (retail)       [address]       [place], [place]  [address]       Ph: [phone]       Fax: [phone]   RxID:   [id number]

## 2010-06-23 NOTE — Assessment & Plan Note (Signed)
Summary: EPH/ appt is 1:L30/ gd   Visit Type:  Follow-up Primary Provider:  [facility]  CC:  no complaints.  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 Followup echoes in 2008 as well as 2004 had demonstrated normalization of LV function.  She was however hospitalized in the spring of this year and again earlier this month with shortness of breath. Left ventricular function assessments demonstrated ejection fraction of 25% or so discharge summary was reviewed  that hospitalization also demonstrated recurrence of hypokalemia and anemia. In hospital care was truncated because of the need to go take care of her sons while [facility] was [facility].  The next day she returned to the emergency room because of back pain. No specific cause is found her potassium had normalized and she was treated symptomatically with dilaudid.  she isn't treated for her cardiomyopathy with carvedilol hydralazine and nitrates. She appears not to be on an ACE inhibitor because of attributable cough. She is currently awaiting her Medicaid at which time we could potentially begin an ARB        .     Current Medications (verified): 1)  Carvedilol 6.25 Mg Tabs (Carvedilol) .... Take One Tablet By Mouth Twice A Day 2)  Lasix 20 Mg Tabs (Furosemide) .Marland Kitchen.. 1 Tablet By Mouth Daily-Hold 3)  Diclofenac Sodium 75 Mg Tbec (Diclofenac Sodium) .Marland Kitchen.. 1 Tablet By Mouth At Bedtime 4)  Hydralazine Hcl 10 Mg Tabs (Hydralazine Hcl) .... Take One Tablet By Mouth Three Times A Day 5)  Isosorbide Mononitrate Cr 30 Mg Xr24h-Tab (Isosorbide Mononitrate) .... Take One Tablet By Mouth Daily 6)  Flexeril 5 Mg Tabs (Cyclobenzaprine Hcl) .... As Needed  Allergies (verified): 1)  ! Ace Inhibitors  Past History:  Past Medical History: Last updated: 10/06/2009 peripartum  cardiomyopathy-resolved as of 2008 Hypertension Mitral regurgitation recent arm injury    Past Surgical History: Last updated: 10/15/2008  Explantation of a previously implanted loop recorder. 03/25/2003  Family History: Last updated: 10/15/2008 Family History of CVA or Stroke: Father deceased at unkown age... CVA  Social History: Last updated: 10/06/2009 Tobacco Use - Yes.  Alcohol Use - no Drug Use - no 3 children  Risk Factors: Smoking Status: current (10/15/2008)  Vital Signs:  Patient profile:   45 year old female Height:      66 inches Weight:      196 pounds BMI:     31.75 Pulse rate:   82 / minute BP sitting:   120 / 86  (left arm) Cuff size:   regular  Vitals Entered By: Hardin Negus, RMA (February 20, 2010 1:46 PM)  Physical Exam  General:  The patient was alert and oriented in no acute distress. HEENT Normal.  Neck veins were 6-7 cm , carotids were brisk.  Lungs were clear.  Heart sounds were regular without murmurs or gallops.  Abdomen was soft with active bowel sounds. There is no clubbing cyanosis ; Trace edema Skin Warm and dry    EKG  Procedure date:  02/20/2010  Findings:      sinus rhythm at 82 Intervals 0.16 6.01/2394 axis LXIII Nonspecific ST-T changes in the inferolateral leads and voltage criteria consistent with left ventricular hypertrophy  Impression & Recommendations:  Problem # 1:  PERIPARTUM CARDIOMYOPATHY POSTPARTUM PREVIOUSLY RESOLVED (ZOX-096.04) she has recurrence of her nonischemic cardiomyopathy previously intubated  to postpartum. She had 2 recent hospital visit for congestive heart failure. She is currently on beta blockers diuretics. She's had problems with hypokalemia.  For right now we will continue her diuretics. We'll plan to reassess her potassium in 2 weeks time. When she goes to her health services it in late October, hopefully having got her Medicaid card, her hydralazine nitrate combination can be  discontinued and she can be started on an ARB. Up titration of her carvedilol at that time would also be useful. I will plan to see her 6-8 weeks thereafter and further adjust her medications and potentially start her on Aldactone. She'll need close attention to her potassium  Problem # 2:  HYPOKALEMIA (ICD-276.8) potassium levels were rechecked on Tuesday they were 3.9 will plan to recheck it again in 2 weeks on her Lasix  Problem # 3:  CHF (ICD-428.0)  as above Her updated medication list for this problem includes:    Carvedilol 6.25 Mg Tabs (Carvedilol) .Marland Kitchen... Take one tablet by mouth twice a day    Furosemide 40 Mg Tabs (Furosemide) ..... Once daily    Isosorbide Mononitrate Cr 30 Mg Xr24h-tab (Isosorbide mononitrate) .Marland Kitchen... Take one tablet by mouth daily  Orders: EKG w/ Interpretation (93000)  Her updated medication list for this problem includes:    Carvedilol 6.25 Mg Tabs (Carvedilol) .Marland Kitchen... Take one tablet by mouth twice a day    Furosemide 40 Mg Tabs (Furosemide) ..... Once daily    Isosorbide Mononitrate Cr 30 Mg Xr24h-tab (Isosorbide mononitrate) .Marland Kitchen... Take one tablet by mouth daily  Problem # 4:  HYPERTENSION, UNSPECIFIED (ICD-401.9)  adequately controlled. We have room to up titrate her drugs. Her updated medication list for this problem includes:    Carvedilol 6.25 Mg Tabs (Carvedilol) .Marland Kitchen... Take one tablet by mouth twice a day    Furosemide 40 Mg Tabs (Furosemide) ..... Once daily    Hydralazine Hcl 10 Mg Tabs (Hydralazine hcl) .Marland Kitchen... Take one tablet by mouth three times a day  Her updated medication list for this problem includes:    Carvedilol 6.25 Mg Tabs (Carvedilol) .Marland Kitchen... Take one tablet by mouth twice a day    Furosemide 40 Mg Tabs (Furosemide) ..... Once daily    Hydralazine Hcl 10 Mg Tabs (Hydralazine hcl) .Marland Kitchen... Take one tablet by mouth three times a day  Patient Instructions: 1)  Your physician recommends that you schedule a follow-up appointment in: 12  weeks 2)  Your physician recommends that you return for lab work in: 2 weeks for a BMET 3)  Your physician has recommended you make the following change in your medication: Furosemide 40mg  Daily.  Prescriptions: FUROSEMIDE 40 MG TABS (FUROSEMIDE) once daily  #30 x 11   Entered by:   Claris Gladden RN   Authorized by:   Nathen May, MD, [facility]   Signed by:   Claris Gladden RN on 02/20/2010   Method used:   Electronically to        [facility] (retail)       [address]       [place], [place]  [address]       Ph: [phone]       Fax: [phone]   RxID:   [id number]

## 2010-06-23 NOTE — Assessment & Plan Note (Signed)
Summary: rov   CC:  rov/ Pt not sure if she is supposed to continue potassium.  Did not get her lasix filled due to finances.  She reports a lot of pain coming from her right hand.  .  History of Present Illness: Chelsea Branch is seen following a recent hospitalization for cough. 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 Followup echoes in 2008 as well as 2004 had demonstrated normalization of LV function.  She was recently hospitalized because of cough and shortness of breath. Her BNP initially was over 400 and decreased over her hospitalization to the 200s.   laboratories from the hospital also demonstrated a potassium of 2.8 followup 4 which is not evident.  she continues to have some cough though it was largely resolved.her shortness of breath is  Current Medications (verified): 1)  Metoprolol Tartrate 50 Mg Tabs (Metoprolol Tartrate) .... Take 1 Tab Two Times A Day 2)  Lasix 20 Mg Tabs (Furosemide) .Marland Kitchen.. 1 Tablet By Mouth Daily-Hold 3)  Hydrocodone-Acetaminophen 5-500 Mg Tabs (Hydrocodone-Acetaminophen) .Marland Kitchen.. 1 Tablet By Mouth Every 4-6hr As Needed For Pain 4)  Diclofenac Sodium 75 Mg Tbec (Diclofenac Sodium) .Marland Kitchen.. 1 Tablet By Mouth At Bedtime 5)  Ferrous Sulfate 325 (65 Fe) Mg Tabs (Ferrous Sulfate) .Marland Kitchen.. 1 Tab Twice Daily  Allergies (verified): 1)  ! Ace Inhibitors  Past History:  Past Surgical History: Last updated: 10/15/2008  Explantation of a previously implanted loop recorder. 03/25/2003  Family History: Last updated: 10/15/2008 Family History of CVA or Stroke: Father deceased at unkown age... CVA  Social History: Last updated: 10/06/2009 Tobacco Use - Yes.  Alcohol Use - no Drug Use - no 3 children  Past Medical History: peripartum cardiomyopathy-resolved as of 2008 Hypertension Mitral regurgitation recent arm injury    Social History: Tobacco Use - Yes.   Alcohol Use - no Drug Use - no 3 children  Vital Signs:  Patient profile:   45 year old female Height:      66 inches Weight:      191 pounds BMI:     30.94 Pulse rate:   88 / minute Pulse rhythm:   regular BP sitting:   162 / 98  (left arm) Cuff size:   large  Vitals Entered By: Judithe Modest CMA (Oct 06, 2009 1:34 PM)  Physical Exam  General:  The patient was alert and oriented in no acute distress. HEENT Normal.  Neck veins were flat, carotids were brisk.  Lungs were clear.  Heart sounds were regular without murmurs or gallops.  Abdomen was soft with active bowel sounds. There is no clubbing cyanosis or edema. Skin Warm and dry    EKG  Procedure date:  09/07/2009  Findings:      sinus rhythm at 64 Intervals 0.19/0.09/0.44 T Wave inversions laterally consistent with repolarization secondary to LVH  Impression & Recommendations:  Problem # 1:  HYPOKALEMIA (ICD-276.8) we'll recheck her metabolic profile today;  previously prescribed HCTZ had been discontinued Orders: TLB-BMP (Basic Metabolic Panel-BMET) (80048-METABOL)  Problem # 2:  HYPERTENSION, UNSPECIFIED (ICD-401.9) hypertension is poorly controlled. She attributes this to the pain in her hands.  Review of the blood pressures while in the hospital were in the 145/90 range. We'll need to keep a close eye on this as opposed to adjust treatments yet  Problem # 3:  PERIPARTUM CARDIOMYOPATHY POSTPARTUM PREVIOUSLY RESOLVED (ICD-674.54) her LV function was resolved as  of her last evaluation in 2008. Given her presentation with elevated BNP and shortness of breath consistent with congestive heart failure this needs to be repeated. This will also have an impact on the need for medications  Problem # 4:  CHF (ICD-428.0) continue her on her current medications and assess her left ventricular function by echo. The following medications were removed from the medication list:    Hydrochlorothiazide 12.5 Mg Tabs  (Hydrochlorothiazide) .Marland Kitchen... 1 tablet by mouth daily Her updated medication list for this problem includes:    Metoprolol Tartrate 50 Mg Tabs (Metoprolol tartrate) .Marland Kitchen... Take 1 tab two times a day    Lasix 20 Mg Tabs (Furosemide) .Marland Kitchen... 1 tablet by mouth daily-hold  Other Orders: Echocardiogram (Echo)  Patient Instructions: 1)  Your physician has requested that you have an echocardiogram.  Echocardiography is a painless test that uses sound waves to create images of your heart. It provides your doctor with information about the size and shape of your heart and how well your heart's chambers and valves are working.  This procedure takes approximately one hour. There are no restrictions for this procedure. 2)  Your physician recommends that you return for lab work today. 3)  Your physician wants you to follow-up in:12 months with Dr Graciela Husbands.   You will receive a reminder letter in the mail two months in advance. If you don't receive a letter, please call our office to schedule the follow-up appointment.

## 2010-06-23 NOTE — Miscellaneous (Signed)
Summary: Hospital admission  INTERNAL MEDICINE ADMISSION HISTORY AND PHYSICAL  Attending: Dr. Lina Sayre First Contact: Adrian Prince [phone]) Second Contact: Jackson Latino [phone])  PCP: [facility]  CC: cough, dysuria  HPI: Pt is a 45 yo female who with h/o cardiomyopathy and HTN who presents with a chief complaint of cough x3 weeks. The cough initially started 3 weeks ago and was markedly worse yesterday to the point where she couldn't go to sleep. She reports it is worse at night though she has been SOB more during the day, particularly at work ( she works on an Theatre stage manager at [employer]). She endorses 2 pillow orthopnea, maybe three, which she says is more than her usual 1-2 pillows at night. Her cough is sometimes productive with clear sputum. Denies fevers but reports recent sick contacts along with watery eyes and nasal congestion. Also endorses chest pain specifically with cough that is central and reproducible with cough. Of note, she was prescribed lisinopril/HCTZ combination for BP and had self elected to stop taking it 5 mo ago because of a cough. She recently started taking it again  ~2weeks ago and noted her cough worsened as a result.   The patient also presents with a complaint of vaginal pain. She reports dysuria, and itching in her vaginal area. Denies abdominal or back pain, as well as N/V. Denies ever having genital lesions or STIs in past. She does have a history of UTIs.  ALLERGIES: Lisinopril- cough  PAST MEDICAL HISTORY: MITRAL REGURGITATION (ICD-396.3) CARDIOMYOPATHY, SECONDARY (ICD-425.9): 2D-ECHO(01/2007): EF 55%, mild MVR and left atrial dilation. 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 Works at [employer] in [place] where she has been off recently following an accident involving her right hand and some machinery. Does not see a regular PCP. Social History: Tobacco Use - Yes.  Alcohol Use - no Drug Use - no  FAMILY HISTORY Family History of CVA or Stroke: Father deceased at unkown age... CVA. Grandmother might have had congestive heart failure per patient. Aunts with anemia (unknown).  ROS: LMP 07/25/2009. Periods generally 4 weeks apart and last 4-5 days with first two being the heaviest. On her heavy days she has to use a super plus tampon plus a heavy pad. Recent crush/burn injury to left hand at work.   VITALS: BP 141/94 HR 87 RR 14 T 98.7 O2SAT 97% on RA  PHYSICAL EXAM: General:  alert, well-developed, and cooperative to examination.   Head:  normocephalic and atraumatic.   Eyes:  vision grossly intact, pupils equal, pupils round, pupils reactive to light, no injection and anicteric.   Mouth:  pharynx pink and moist, no erythema, and no exudates.   Neck:  supple, full ROM, no thyromegaly, no JVD, and no carotid bruits.   Lungs:  normal respiratory effort, no accessory muscle use, clear to auscultation bilaterally Heart:  normal rate, regular rhythm, no murmur, no gallop, and no rub.   Abdomen:  soft, suprapubic tenderness, normal bowel sounds, no distention, no guarding, no rebound tenderness, no hepatomegaly, and  no splenomegaly. No costovertebral angle tenderness  Msk:  no joint swelling, no joint warmth, and no redness over joints.   Pulses:  2+ DP/PT pulses bilaterally Extremities:  No cyanosis, clubbing, edema Neurologic:  alert & oriented X3, cranial  nerves II-XII intact, strength normal in all extremities, sensation intact to light touch, and gait normal.   Skin:  turgor normal and no rashes.   Psych:  Oriented X3, memory intact for recent and remote, normally interactive, good eye contact, not anxious appearing, and not depressed appearing.  LABS: UA: Color, Urine                             YELLOW            YELLOW  Appearance                               CLOUDY     a      CLEAR  Specific Gravity                         1.019             1.005-1.030  pH                                       8.0               5.0-8.0  Urine Glucose                            NEGATIVE          NEG              mg/dL  Bilirubin                                NEGATIVE          NEG  Ketones                                  NEGATIVE          NEG              mg/dL  Blood                                    NEGATIVE          NEG  Protein                                  30         a      NEG              mg/dL  Urobilinogen                             0.2               0.0-1.0  mg/dL  Nitrite                                  NEGATIVE          NEG  Leukocytes                               MODERATE   a      NEG  Squamous Epithelial / LPF                MANY       a      RARE  WBC / HPF                                3-6               <3               WBC/hpf  Bacteria / HPF                           MANY       a      RARE  WET PREP: Yeast, Wet Prep                          NONE SEEN         NS  Trichomonads, Wet Prep                   NONE SEEN         NS  Clue Cells, Wet Prep                     NONE SEEN         NS  WBC, Wet Prep                            MODERATE   a      NS  BMET: Sodium (NA)                              141               135-145          mEq/L  Potassium (K)                            3.7               3.5-5.1          mEq/L  Chloride                                 106               96-112           mEq/L  CO2  30                19-32            mEq/L  Glucose                                  105        h      70-99            mg/dL  BUN                                      4          l      6-23             mg/dL  Creatinine                               0.61              0.4-1.2          mg/dL  GFR, Est Non African American            >60               >60              mL/min  GFR, Est African American                >60               >60              mL/min    Oversized comment, see footnote  1  Calcium                                  9.0               8.4-10.5         mg/dL  CBC: WBC                                      6.3               4.0-10.5         K/uL    WHITE COUNT CONFIRMED ON SMEAR  RBC                                      4.55              3.87-5.11        MIL/uL  Hemoglobin (HGB)                         8.6        l      12.0-15.0        g/dL  Hematocrit (HCT)                         28.3  l      36.0-46.0        %  MCV                                      62.3       l      78.0-100.0       fL  MCHC                                     30.4              30.0-36.0        g/dL  RDW                                      20.8       h      11.5-15.5        %  Platelet Count (PLT)                     281               150-400          K/uL    PLATELET COUNT CONFIRMED BY SMEAR    LARGE PLATELETS PRESENT  Neutrophils, %                           47                43-77            %  Lymphocytes, %                           41                12-46            %  Monocytes, %                             10                3-12             %  Eosinophils, %                           2                 0-5              %  Basophils, %                             0                 0-1              %  Neutrophils, Absolute                    3.0               1.7-7.7  K/uL  Lymphocytes, Absolute                    2.6               0.7-4.0          K/uL   Monocytes, Absolute                      0.6               0.1-1.0          K/uL  Eosinophils, Absolute                    0.1               0.0-0.7          K/uL  Basophils, Absolute                      0.0               0.0-0.1          K/uL  RBC Morphology                           SEE NOTE.    TARGET CELLS    POLYCHROMASIA PRESENT  CE:  CKMB, POC                                <1.0       l      1.0-8.0          ng/mL  Troponin I, POC                          <0.05             0.00-0.09        ng/mL  Myoglobin, POC                           60.2              12-200           ng/mL  CKMB, POC                                1.5               1.0-8.0          ng/mL  Troponin I, POC                          <0.05             0.00-0.09        ng/mL  Myoglobin, POC                           55.1              12-200           ng/mL  FOBT negative  ZOX:[id number] and pulmonary vascular congestion/pulmonary venous   hypertension   ASSESSMENT AND PLAN:  1. Cough: Differential includes acute CHF exacerbation from ACEI intolerance,  and postnasal drip, flu with Hx of sick contacts, or cardiomyopathy.  CXR showed evidence of pulmonary vascular congestion and pt has been endorsing symptoms concerning for acute CHF exacerbation and with elevated BNP. Given Lasix 40 x1 in ED and symptoms improved slightly per patient. Still, cough likely exacerbated by ACEI use and postnasal drip given sick contacts and recent nasal congestion. ---Will admit overnight for observation ---Will stop lisinopril/HCTZ, will provide lasix 40mg  daily and metoprolol 50mg  bid while inpatient. ---Will repeat AM EKG and cycle cardiac enzymes given recent changes on EKG. ---repeat AM BNP and may have 2D-ECHO at outpatient.  2. UTI: Pt with symptomatic pyuria. Urine Cx pending. Have initiated cipro 500mg  by mouth two times a day for 3 days given uncomplicated course. Multiple GC/Chlamydia negative in the past.  3. Anemia:  Unclear cause, she may have thalasemia, GI bleeding,  or hemolysis. Heavy menstrual bleeding and recent injury likely contributors to anemia as well. Denied s/sx including fatigue, palpitations, hematochezia/melena. HgB 10.1, MCV68.6 1 year ago, 4 years ago HgB 13,  never done colonoscopy. One FOBT negative, Have ordered peripheral smear, FOBT x2, and anemia panel. Haptoglobin and LDH sent to evaluate for hemolysis.  3. HTN: holdingWill stop lisinopril as it causes her cough. WIll likely reinstitute HCTZ prior to discharge. Consider ARB in patient. 5. Dispo: May discharge home with ABx nd have outpatient 2-DECHO  ()VTE PROPH: lovenox  Attending Physician:  I performed and/or observed a history and physical examination of the patient.  I discussed the case with the residents as noted and reviewed the residents' notes.  I agree with the findings and plan--please refer to the attending physician note for more details.  Signature  Printed Name

## 2010-06-24 ENCOUNTER — Encounter (INDEPENDENT_AMBULATORY_CARE_PROVIDER_SITE_OTHER): Payer: Self-pay | Admitting: *Deleted

## 2010-06-24 LAB — CONVERTED CEMR LAB
Alkaline Phosphatase: 84 units/L (ref 39–117)
BUN: 5 mg/dL — ABNORMAL LOW (ref 6–23)
Basophils Relative: 0 % (ref 0–1)
Eosinophils Absolute: 0.2 10*3/uL (ref 0.0–0.7)
Glucose, Bld: 89 mg/dL (ref 70–99)
Hemoglobin: 12 g/dL (ref 12.0–15.0)
MCHC: 31.3 g/dL (ref 30.0–36.0)
MCV: 79.5 fL (ref 78.0–100.0)
Monocytes Absolute: 0.4 10*3/uL (ref 0.1–1.0)
Monocytes Relative: 8 % (ref 3–12)
Neutro Abs: 2.3 10*3/uL (ref 1.7–7.7)
RBC: 4.83 M/uL (ref 3.87–5.11)
Total Bilirubin: 0.6 mg/dL (ref 0.3–1.2)

## 2010-06-25 NOTE — Progress Notes (Addendum)
Summary: QUESTION ON MEDS/2nd call  Phone Note Call from Patient Call back at [phone]   Reason for Call: Talk to Nurse Summary of Call: PT CALLING RE MEDS. PT STATES SHE CAN NOT AFFORD HER MEDS. Initial call taken by: Roe Coombs,  June 09, 2010 1:01 PM  Follow-up for Phone Call        pt states that lOSARTAN IS $55 GENERIC. DR. HAS TO AUTHORIZE THIS MEDICATION THROUGH MEDICAIDE OR PRESRIBE SOMETHING ELSE. ADV PT WOULD DISCUSS W/DR. Adlyn Fife AND ADV. Follow-up by: Claris Gladden RN,  June 10, 2010 9:24 AM  Additional Follow-up for Phone Call Additional follow up Details #1::        pt returing nurse call. pt [phone] Additional Follow-up by: Roe Coombs,  June 11, 2010 11:23 AM    Additional Follow-up for Phone Call Additional follow up Details #2::    spoke w/carol at [facility] and she states that they sent the auth fax to [place] yesterday. have not seen it yet. will call medicaid at [phone] on pt id [id number] k to get auth for losartan. Claris Gladden, RN, BSN 1.19.12 1126 am laura at [facility] has approved the medicine for 57yr from today. adv pt of info.adv Alveta Heimlich and she stated that a new prescription was needed with remarks meets pa criteria.  Follow-up by: Claris Gladden RN,  June 11, 2010 2:23 PM  Prescriptions: LOSARTAN POTASSIUM 50 MG TABS (LOSARTAN POTASSIUM) take one tablet by mouth once daily  #30 x 11   Entered by:   Claris Gladden RN   Authorized by:   Nathen May, MD, [facility]   Signed by:   Claris Gladden RN on 06/11/2010   Method used:   Electronically to        [facility] (retail)       [address]       [place], [place]  [address]       Ph: [phone]       Fax: [phone]   RxID:   [id number]

## 2010-06-25 NOTE — Assessment & Plan Note (Signed)
Summary: rov   Primary Provider:  Dala Dock   History of Present Illness: Primary Electrophysiologist:  Dr. 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  She had some plaque in her RCA at cath in 2004, but no obstructive CAD.  She has had improvement in her LVF in the past but recently saw Dr. Graciela Husbands in follow up after an echo in 10/2009 demonstrated poor LVF again with EF 25-30%.  She is intolerant to ACE inhibs.  She has been tx'd with carvedilol, hydralazine, nitrates and furosemide.  She was admitted to [facility] 12/25-27/2011 with an acute/subacute left brain CVA.  She was seen by neurology.  An MRI/MRA demonstrated a left frontal lobe infarct; Chiari 1 malformation; 2 mm posterior communicating artery aneurysm.  Carotids were neg for ICA stenosis.  A f/u echo demonstrated an improved EF 45-50%; inf HK; mild LVH; mild LAE.    Overall, she is doing well.  She denies chest pain, dyspnea on exertion, orthopnea or PND.  She denies significant pedal edema.  She denies syncope.  She has felt lightheaded at times.  She really describes a spinning sensation.  She's had vertigo in the past.  She had some feelings of fullness in her left ear.  This is resolved.  Her dizziness is improving.  She is follow up with her primary care physician soon.   Current Medications (verified): 1)  Carvedilol 6.25 Mg Tabs (Carvedilol) .... Take One Tablet By Mouth Twice A Day 2)  Furosemide 40 Mg Tabs (Furosemide) .... Once Daily 3)  Hydralazine Hcl 10 Mg Tabs (Hydralazine Hcl) .... Take One Tablet By Mouth Three Times A Day 4)  Isosorbide Mononitrate Cr 30 Mg Xr24h-Tab (Isosorbide Mononitrate) .... Take One Tablet By Mouth Daily 5)  Aspirin 325 Mg  Tabs (Aspirin) .Marland Kitchen.. 1 By Mouth Daily 6)  Feosol 200 (65 Fe) Mg Tabs (Ferrous Sulfate Dried) .Marland Kitchen.. 1 By Mouth Daily 7)  Prenatal/iron  Tabs (Prenatal Multivit-Min-Fe-Fa) .Marland Kitchen.. 1 By Mouth Daily 8)   Pravastatin Sodium 20 Mg Tabs (Pravastatin Sodium) .Marland Kitchen.. 1 By Mouth Daily  Allergies (verified): 1)  ! 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 echo 04/2010: EF 45-50%; inf HK; mild LVH; mild LAE chronic systolic CHF cath 01/2003: normal cors except for mild plaque in RCA Hypertension chronic anemia 2/2 menorrhagia  Review of Systems       As per  the HPI.  All other systems reviewed and negative.   Vital Signs:  Patient profile:   45 year old female Height:      66 inches Weight:      197 pounds BMI:     31.91 Pulse rate:   77 / minute Resp:     16 per minute BP sitting:   127 / 79  (right arm)  Vitals Entered By: Marrion Coy, CNA (June 08, 2010 2:12 PM)  Physical Exam  General:  Well nourished, well developed, in no acute distress HEENT: normal Neck: no JVD Cardiac:  normal S1, S2; RRR; no murmur Lungs:  clear to auscultation bilaterally, no wheezing, rhonchi or rales Abd: soft, nontender, no hepatomegaly Ext: no edema Vascular: no carotid  bruits Skin: warm and dry Neuro:  CNs 2-12 intact, no focal abnormalities noted    EKG  Procedure date:  06/08/2010  Findings:      normal sinus rhythm Heart rate  62 Normal axis Downsloping ST segments with T wave inversions/biphasic T waves in leads 3, aVF, V4-V6 No significant changes since previous tracing  Impression & Recommendations:  Problem # 1:  CHF (ICD-428.0) She is optivolemic.  Orders: EKG w/ Interpretation (93000)  Problem # 2:  PERIPARTUM CARDIOMYOPATHY POSTPARTUM PREVIOUSLY RESOLVED (ZOX-096.04) She has had variable EF in the past.  Most recent EF is improved. She now has medicaid and should be able to afford an ARB. Will stop her hydralazine and imdur and start her on Losartan 50 mg once daily. She had a cough with ACE inhib's in the past and cannot use them. Check a bmet in one week and follow up with me  in 2-3 weeks.  Problem # 3:  HYPERTENSION, UNSPECIFIED (ICD-401.9) Controlled.  Problem # 4:  CEREBROVASCULAR ACCIDENT, HX OF (ICD-V12.50) Continue ASA and statin and f/u with PCP at [facility].  Problem # 5:  DYSLIPIDEMIA (ICD-272.4) Pravastatin initiated in the hospital. Follow up on FLP and LFTs with Dr. Andrey Campanile at [facility].  Problem # 6:  GLUCOSE INTOLERANCE (ICD-271.3) F/u with PCP to monitor sugars.  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 F/u with PCP.  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 F/u with PCP.  Apparently she does not have an appt with neurology.  Problem # 9:  Vertigo F/u with PCP.  Patient Instructions: 1)  Stop hydralazine.  2)  Stop isosorbide (imdur). 3)  Start losartan 50mg  once daily. 4)  You will need to return in 1 week for labwork: bmet (428;401.1). 5)  Your physician recommends that you schedule a follow-up appointment in: 2-3 weeks with Dr. Graciela Husbands or with Lilian Coma on a day Dr. Graciela Husbands is in the office. Prescriptions: LOSARTAN POTASSIUM 50 MG TABS (LOSARTAN POTASSIUM) take one tablet by mouth once daily  #30 x 6   Entered by:   Sherri Rad, RN, BSN   Authorized by:   Tereso Newcomer PA-C   Signed by:   Sherri Rad, RN, BSN on 06/08/2010   Method used:   Electronically to        [facility] (retail)       [address]       [place], [place]  [address]       Ph: [phone]       Fax: [phone]   RxID:   [id number]  I have personally reviewed the prescriptions today for accuracy.. . Tereso Newcomer PA-C  June 08, 2010 5:21 PM

## 2010-06-25 NOTE — Progress Notes (Signed)
Summary: pt rtn call  Phone Note Call from Patient   Caller: Patient [phone] Reason for Call: Talk to Nurse Summary of Call: pt rtn call Initial call taken by: Glynda Jaeger,  June 16, 2010 3:21 PM  Follow-up for Phone Call        I spoke with the pt.  Follow-up by: Sherri Rad, RN, BSN,  June 16, 2010 3:32 PM

## 2010-07-09 ENCOUNTER — Ambulatory Visit: Payer: Self-pay | Admitting: Internal Medicine

## 2010-07-09 ENCOUNTER — Ambulatory Visit (INDEPENDENT_AMBULATORY_CARE_PROVIDER_SITE_OTHER): Payer: Medicaid Other | Admitting: Internal Medicine

## 2010-07-09 ENCOUNTER — Encounter: Payer: Self-pay | Admitting: Internal Medicine

## 2010-07-09 DIAGNOSIS — I428 Other cardiomyopathies: Secondary | ICD-10-CM

## 2010-07-09 DIAGNOSIS — I5022 Chronic systolic (congestive) heart failure: Secondary | ICD-10-CM

## 2010-07-09 NOTE — Letter (Signed)
Summary: [facility] Office Visit Note   [facility] Office Visit Note   Imported By: Roderic Ovens 06/30/2010 12:50:41  _____________________________________________________________________  External Attachment:    Type:   Image     Comment:   External Document

## 2010-07-21 NOTE — Assessment & Plan Note (Signed)
Summary: rov per pt call/lg/kl rs pt appt per office/mt   Visit Type:  Follow-up Primary Provider:  [facility]  CC:  No complaints.  History of Present Illness: Chelsea Branch is seen in followup for nonischemic cardiomyopathy with ejection fraction of 25-30%. Initially this was thought to be peripartum in origin; she had significant interval improvement in LV function but more recently has had recurrent deterioration; most recently however there've been improvement again with echo. in December demonstrated EF 45-50%   She is intolerant to ACE inhibs.  She has been tx'd with carvedilol, hydralazine, nitrates and furosemide.   in December 2011 , she was admitted with a left brain CVA   She was seen by neurology.  An MRI/MRA demonstrated a left frontal lobe infarct; Chiari 1 malformation; 2 mm posterior communicating artery aneurysm.  Carotids were neg for ICA stenosis.  A f/u echo demonstrated an improved EF 45-50%; inf HK; mild LVH; mild LAE.    She isn't really very well. The issues and that she is stop smoking which is giving rise to some nervousness. She is put on some weight and she is working hard on her diet.  Her boys are doing relatively well. The oldest is back HCTZ C.; the twins are Juniors at [employer] struggling to stay academically eligible for basketball    Current Medications (verified): 1)  Carvedilol 6.25 Mg Tabs (Carvedilol) .... Take One Tablet By Mouth Twice A Day 2)  Furosemide 20 Mg Tabs (Furosemide) .... Take 1 Tablet By Mouth Two Times A Day 3)  Aspirin 325 Mg  Tabs (Aspirin) .Marland Kitchen.. 1 By Mouth Daily 4)  Feosol 200 (65 Fe) Mg Tabs (Ferrous Sulfate Dried) .Marland Kitchen.. 1 By Mouth Daily 5)  Prenatal/iron  Tabs (Prenatal Multivit-Min-Fe-Fa) .Marland Kitchen.. 1 By Mouth Daily 6)  Pravastatin Sodium 20 Mg Tabs (Pravastatin Sodium) .Marland Kitchen.. 1 By Mouth Daily. Ran Out About 1 Month Ago 7)  Losartan Potassium 50 Mg Tabs (Losartan Potassium) .... Take One Tablet By Mouth Once Daily 8)  Potassium Chloride  Crys Cr 20 Meq Cr-Tabs (Potassium Chloride Crys Cr) .... Take One Tablet By Mouth Daily 9)  Fish Oil 1200 Mg Caps (Omega-3 Fatty Acids) .... Take 1 Capsule By Mouth Two Times A Day  Allergies: 1)  ! Ace Inhibitors  Past History:  Past Medical History: Last updated: 06/08/2010 peripartum cardiomyopathy-h/o normalized EF and recurrent reduced EF   a. echo 04/2010: EF 45-50%; inf HK; mild LVH; mild LAE chronic systolic CHF cath 01/2003: normal cors except for mild plaque in RCA Hypertension chronic anemia 2/2 menorrhagia  Family History: Reviewed history from 10/15/2008 and no changes required. Family History of CVA or Stroke: Father deceased at unkown age... CVA  Social History: Reviewed history from 05/20/2010 and no changes required. Tobacco Use - Yes.  Alcohol Use - no Drug Use - no 3 children  Vital Signs:  Patient profile:   45 year old female Height:      66 inches Weight:      200 pounds BMI:     32.40 Pulse rate:   70 / minute Pulse rhythm:   regular Resp:     18 per minute BP sitting:   138 / 92  (right arm) Cuff size:   large  Vitals Entered By: Vikki Ports (July 09, 2010 12:56 PM)  Physical Exam  General:  The patient was alert and oriented in no acute distress. HEENT Normal.  Neck veins were flat, carotids were brisk.  Lungs were clear.  Heart  sounds were regular without murmurs or gallops.  Abdomen was soft with active bowel sounds. There is no clubbing cyanosis or edema. Skin Warm and dry    Impression & Recommendations:  Problem # 1:  PERIPARTUM CARDIOMYOPATHY POSTPARTUM PREVIOUSLY RESOLVED (EAV-409.81) she had recurrent problems with cardiomyopathy with ejection fractions running from 25-normal-40. Most recently she has been in the 45% range although she was 25% in September. She is currently well compensated from a heart failure point of view. We will continue her on her current medications.  Problem # 2:  CHF (ICD-428.0)  as  above  Her updated medication list for this problem includes:    Carvedilol 6.25 Mg Tabs (Carvedilol) .Marland Kitchen... Take one tablet by mouth twice a day    Furosemide 20 Mg Tabs (Furosemide) .Marland Kitchen... Take 1 tablet by mouth two times a day    Aspirin 325 Mg Tabs (Aspirin) .Marland Kitchen... 1 by mouth daily    Losartan Potassium 50 Mg Tabs (Losartan potassium) .Marland Kitchen... Take one tablet by mouth once daily  Problem # 3:  CEREBROVASCULAR ACCIDENT, HX OF (ICD-V12.50) she elected to be treated with aspirin and not Coumadin at the time of her event.  Patient Instructions: 1)  Your physician recommends that you continue on your current medications as directed. Please refer to the Current Medication list given to you today. 2)  Your physician wants you to follow-up in: 6 MONTHS WITH DR Logan Bores will receive a reminder letter in the mail two months in advance. If you don't receive a letter, please call our office to schedule the follow-up appointment. Prescriptions: PRAVASTATIN SODIUM 20 MG TABS (PRAVASTATIN SODIUM) 1 by mouth daily. RAN OUT about 1 month ago  #30 x 11   Entered by:   Scherrie Bateman, LPN   Authorized by:   Nathen May, MD, [facility]   Signed by:   Scherrie Bateman, LPN on 19/14/7829   Method used:   Electronically to        [facility] (retail)       [address]       [place], [place]  [address]       Ph: [phone]       Fax: [phone]   RxID:   [id number]

## 2010-08-03 LAB — FERRITIN: Ferritin: 7 ng/mL — ABNORMAL LOW (ref 10–291)

## 2010-08-03 LAB — BASIC METABOLIC PANEL
BUN: 3 mg/dL — ABNORMAL LOW (ref 6–23)
BUN: 9 mg/dL (ref 6–23)
Calcium: 8.3 mg/dL — ABNORMAL LOW (ref 8.4–10.5)
Chloride: 105 mEq/L (ref 96–112)
Chloride: 107 mEq/L (ref 96–112)
Creatinine, Ser: 0.64 mg/dL (ref 0.4–1.2)
Creatinine, Ser: 0.64 mg/dL (ref 0.4–1.2)
GFR calc Af Amer: >60 mL/min (ref >60)
Glucose, Bld: 98 mg/dL (ref 70–99)
Potassium: 4 mEq/L (ref 3.5–5.1)

## 2010-08-03 LAB — URINALYSIS, ROUTINE W REFLEX MICROSCOPIC
Bilirubin Urine: NEGATIVE
Glucose, UA: NEGATIVE mg/dL
Hgb urine dipstick: NEGATIVE
Ketones, ur: NEGATIVE mg/dL
Nitrite: NEGATIVE
Specific Gravity, Urine: 1.012 (ref 1.005–1.030)
pH: 6.5 (ref 5.0–8.0)

## 2010-08-03 LAB — GLUCOSE, CAPILLARY: Glucose-Capillary: 108 mg/dL — ABNORMAL HIGH (ref 70–99)

## 2010-08-03 LAB — HEMOGLOBIN A1C: Hgb A1c MFr Bld: 6 % — ABNORMAL HIGH (ref <5.7)

## 2010-08-03 LAB — CBC
HCT: 34.2 % — ABNORMAL LOW (ref 36.0–46.0)
MCHC: 30.4 g/dL (ref 30.0–36.0)
MCHC: 30.7 g/dL (ref 30.0–36.0)
MCV: 74 fL — ABNORMAL LOW (ref 78.0–100.0)
MCV: 74 fL — ABNORMAL LOW (ref 78.0–100.0)
Platelets: 249 10*3/uL (ref 150–400)
Platelets: 290 10*3/uL (ref 150–400)
Platelets: 300 10*3/uL (ref 150–400)
RBC: 4.27 MIL/uL (ref 3.87–5.11)
RDW: 21.4 % — ABNORMAL HIGH (ref 11.5–15.5)
RDW: 21.5 % — ABNORMAL HIGH (ref 11.5–15.5)
RDW: 21.7 % — ABNORMAL HIGH (ref 11.5–15.5)
WBC: 6 10*3/uL (ref 4.0–10.5)
WBC: 7.2 10*3/uL (ref 4.0–10.5)

## 2010-08-03 LAB — LIPID PANEL
Cholesterol: 143 mg/dL (ref 0–200)
LDL Cholesterol: 91 mg/dL (ref 0–99)
Total CHOL/HDL Ratio: 3.8 RATIO
VLDL: 14 mg/dL (ref 0–40)

## 2010-08-03 LAB — DIFFERENTIAL
Basophils Absolute: 0 10*3/uL (ref 0.0–0.1)
Eosinophils Absolute: 0.1 10*3/uL (ref 0.0–0.7)
Lymphocytes Relative: 36 % (ref 12–46)
Monocytes Relative: 7 % (ref 3–12)
Neutro Abs: 4 10*3/uL (ref 1.7–7.7)
Neutrophils Relative %: 55 % (ref 43–77)

## 2010-08-03 LAB — COMPREHENSIVE METABOLIC PANEL
Albumin: 3.1 g/dL — ABNORMAL LOW (ref 3.5–5.2)
BUN: 8 mg/dL (ref 6–23)
Chloride: 104 mEq/L (ref 96–112)
Creatinine, Ser: 0.78 mg/dL (ref 0.4–1.2)
Glucose, Bld: 97 mg/dL (ref 70–99)
Total Bilirubin: 0.5 mg/dL (ref 0.3–1.2)
Total Protein: 6.6 g/dL (ref 6.0–8.3)

## 2010-08-03 LAB — IRON AND TIBC
Iron: 35 ug/dL — ABNORMAL LOW (ref 42–135)
Saturation Ratios: 9 % — ABNORMAL LOW (ref 20–55)
TIBC: 402 ug/dL (ref 250–470)

## 2010-08-03 LAB — PROTIME-INR
INR: 1.04 (ref 0.00–1.49)
Prothrombin Time: 13.8 seconds (ref 11.6–15.2)

## 2010-08-03 LAB — URINE CULTURE
Colony Count: 55000
Culture  Setup Time: 201112252004

## 2010-08-03 LAB — CK TOTAL AND CKMB (NOT AT ARMC)
CK, MB: 2.4 ng/mL (ref 0.3–4.0)
Relative Index: 1.3 (ref 0.0–2.5)

## 2010-08-04 LAB — URINALYSIS, ROUTINE W REFLEX MICROSCOPIC
Bilirubin Urine: NEGATIVE
Hgb urine dipstick: NEGATIVE
Ketones, ur: NEGATIVE mg/dL
Protein, ur: NEGATIVE mg/dL
Urobilinogen, UA: 0.2 mg/dL (ref 0.0–1.0)

## 2010-08-04 LAB — WET PREP, GENITAL
Clue Cells Wet Prep HPF POC: NONE SEEN
Trich, Wet Prep: NONE SEEN

## 2010-08-04 LAB — GC/CHLAMYDIA PROBE AMP, GENITAL
Chlamydia, DNA Probe: NEGATIVE
GC Probe Amp, Genital: NEGATIVE

## 2010-08-06 LAB — URINALYSIS, ROUTINE W REFLEX MICROSCOPIC
Bilirubin Urine: NEGATIVE
Glucose, UA: NEGATIVE mg/dL
Glucose, UA: NEGATIVE mg/dL
Hgb urine dipstick: NEGATIVE
Ketones, ur: NEGATIVE mg/dL
Nitrite: NEGATIVE
Protein, ur: 30 mg/dL — AB
Protein, ur: NEGATIVE mg/dL
Urobilinogen, UA: 0.2 mg/dL (ref 0.0–1.0)

## 2010-08-06 LAB — COMPREHENSIVE METABOLIC PANEL
Albumin: 2.8 g/dL — ABNORMAL LOW (ref 3.5–5.2)
Alkaline Phosphatase: 77 U/L (ref 39–117)
BUN: 7 mg/dL (ref 6–23)
Calcium: 8.5 mg/dL (ref 8.4–10.5)
Glucose, Bld: 89 mg/dL (ref 70–99)
Potassium: 3.3 mEq/L — ABNORMAL LOW (ref 3.5–5.1)
Sodium: 142 mEq/L (ref 135–145)
Total Protein: 5.9 g/dL — ABNORMAL LOW (ref 6.0–8.3)

## 2010-08-06 LAB — POCT I-STAT, CHEM 8
Chloride: 102 mEq/L (ref 96–112)
HCT: 35 % — ABNORMAL LOW (ref 36.0–46.0)
Hemoglobin: 11.9 g/dL — ABNORMAL LOW (ref 12.0–15.0)
Potassium: 3.9 mEq/L (ref 3.5–5.1)
Sodium: 139 mEq/L (ref 135–145)

## 2010-08-06 LAB — CK TOTAL AND CKMB (NOT AT ARMC)
CK, MB: 2.1 ng/mL (ref 0.3–4.0)
Relative Index: 1.8 (ref 0.0–2.5)
Total CK: 116 U/L (ref 7–177)

## 2010-08-06 LAB — DIFFERENTIAL
Basophils Absolute: 0 10*3/uL (ref 0.0–0.1)
Eosinophils Absolute: 0.1 10*3/uL (ref 0.0–0.7)
Eosinophils Absolute: 0.1 10*3/uL (ref 0.0–0.7)
Lymphocytes Relative: 21 % (ref 12–46)
Lymphocytes Relative: 39 % (ref 12–46)
Lymphs Abs: 2.3 10*3/uL (ref 0.7–4.0)
Monocytes Absolute: 0.5 10*3/uL (ref 0.1–1.0)
Monocytes Relative: 8 % (ref 3–12)
Neutro Abs: 3 10*3/uL (ref 1.7–7.7)
Neutrophils Relative %: 51 % (ref 43–77)
Neutrophils Relative %: 73 % (ref 43–77)

## 2010-08-06 LAB — URINE MICROSCOPIC-ADD ON

## 2010-08-06 LAB — GC/CHLAMYDIA PROBE AMP, GENITAL: GC Probe Amp, Genital: NEGATIVE

## 2010-08-06 LAB — CBC
Hemoglobin: 9.1 g/dL — ABNORMAL LOW (ref 12.0–15.0)
MCHC: 29.1 g/dL — ABNORMAL LOW (ref 30.0–36.0)
MCHC: 29.6 g/dL — ABNORMAL LOW (ref 30.0–36.0)
MCV: 69 fL — ABNORMAL LOW (ref 78.0–100.0)
Platelets: 307 10*3/uL (ref 150–400)
RDW: 19.7 % — ABNORMAL HIGH (ref 11.5–15.5)
RDW: 20.1 % — ABNORMAL HIGH (ref 11.5–15.5)
WBC: 5.9 10*3/uL (ref 4.0–10.5)
WBC: 9.7 10*3/uL (ref 4.0–10.5)

## 2010-08-06 LAB — POCT CARDIAC MARKERS
CKMB, poc: 1 ng/mL (ref 1.0–8.0)
Myoglobin, poc: 41.4 ng/mL (ref 12–200)
Troponin i, poc: <0.05 ng/mL (ref 0.00–0.09)

## 2010-08-06 LAB — URINE CULTURE

## 2010-08-06 LAB — WET PREP, GENITAL: Clue Cells Wet Prep HPF POC: NONE SEEN

## 2010-08-06 LAB — TROPONIN I: Troponin I: 0.13 ng/mL — ABNORMAL HIGH (ref 0.00–0.06)

## 2010-08-06 LAB — BRAIN NATRIURETIC PEPTIDE: Pro B Natriuretic peptide (BNP): 728 pg/mL — ABNORMAL HIGH (ref 0.0–100.0)

## 2010-08-06 LAB — TSH: TSH: 0.95 u[IU]/mL (ref 0.350–4.500)

## 2010-08-07 LAB — WET PREP, GENITAL: Trich, Wet Prep: NONE SEEN

## 2010-08-10 LAB — URINE MICROSCOPIC-ADD ON

## 2010-08-10 LAB — URINALYSIS, ROUTINE W REFLEX MICROSCOPIC
Bilirubin Urine: NEGATIVE
Glucose, UA: NEGATIVE mg/dL
Hgb urine dipstick: NEGATIVE
Ketones, ur: NEGATIVE mg/dL
pH: 6 (ref 5.0–8.0)

## 2010-08-10 LAB — POCT I-STAT, CHEM 8
Calcium, Ion: 1.11 mmol/L — ABNORMAL LOW (ref 1.12–1.32)
HCT: 37 % (ref 36.0–46.0)
TCO2: 26 mmol/L (ref 0–100)

## 2010-08-10 LAB — WET PREP, GENITAL: Clue Cells Wet Prep HPF POC: NONE SEEN

## 2010-08-10 LAB — BRAIN NATRIURETIC PEPTIDE: Pro B Natriuretic peptide (BNP): 437 pg/mL — ABNORMAL HIGH (ref 0.0–100.0)

## 2010-08-10 LAB — POCT PREGNANCY, URINE: Preg Test, Ur: NEGATIVE

## 2010-08-11 LAB — URINALYSIS, ROUTINE W REFLEX MICROSCOPIC
Bilirubin Urine: NEGATIVE
Hgb urine dipstick: NEGATIVE
Nitrite: NEGATIVE
Specific Gravity, Urine: 1.019 (ref 1.005–1.030)
pH: 8 (ref 5.0–8.0)

## 2010-08-11 LAB — BASIC METABOLIC PANEL
BUN: 4 mg/dL — ABNORMAL LOW (ref 6–23)
Chloride: 106 mEq/L (ref 96–112)
Potassium: 3.7 mEq/L (ref 3.5–5.1)
Sodium: 141 mEq/L (ref 135–145)

## 2010-08-11 LAB — COMPREHENSIVE METABOLIC PANEL
ALT: 18 U/L (ref 0–35)
Albumin: 3 g/dL — ABNORMAL LOW (ref 3.5–5.2)
Alkaline Phosphatase: 94 U/L (ref 39–117)
Chloride: 101 mEq/L (ref 96–112)
Glucose, Bld: 121 mg/dL — ABNORMAL HIGH (ref 70–99)
Potassium: 2.7 mEq/L — CL (ref 3.5–5.1)
Sodium: 138 mEq/L (ref 135–145)
Total Bilirubin: 0.5 mg/dL (ref 0.3–1.2)
Total Protein: 6.6 g/dL (ref 6.0–8.3)

## 2010-08-11 LAB — LIPID PANEL
HDL: 37 mg/dL — ABNORMAL LOW (ref >39)
Total CHOL/HDL Ratio: 4 RATIO

## 2010-08-11 LAB — CBC
HCT: 28.3 % — ABNORMAL LOW (ref 36.0–46.0)
Hemoglobin: 8.3 g/dL — ABNORMAL LOW (ref 12.0–15.0)
Hemoglobin: 8.6 g/dL — ABNORMAL LOW (ref 12.0–15.0)
MCV: 62.3 fL — ABNORMAL LOW (ref 78.0–100.0)
Platelets: 281 10*3/uL (ref 150–400)
RBC: 4.55 MIL/uL (ref 3.87–5.11)
RDW: 21 % — ABNORMAL HIGH (ref 11.5–15.5)
WBC: 6.3 10*3/uL (ref 4.0–10.5)
WBC: 7.3 10*3/uL (ref 4.0–10.5)

## 2010-08-11 LAB — POCT CARDIAC MARKERS
CKMB, poc: 1.5 ng/mL (ref 1.0–8.0)
CKMB, poc: <1 ng/mL — ABNORMAL LOW (ref 1.0–8.0)
Myoglobin, poc: 55.1 ng/mL (ref 12–200)
Troponin i, poc: <0.05 ng/mL (ref 0.00–0.09)

## 2010-08-11 LAB — DIFFERENTIAL
Basophils Relative: 0 % (ref 0–1)
Eosinophils Relative: 2 % (ref 0–5)
Lymphs Abs: 2.6 10*3/uL (ref 0.7–4.0)
Monocytes Relative: 10 % (ref 3–12)
Neutro Abs: 3 10*3/uL (ref 1.7–7.7)

## 2010-08-11 LAB — URINE MICROSCOPIC-ADD ON

## 2010-08-11 LAB — HEMOCCULT GUIAC POC 1CARD (OFFICE): Fecal Occult Bld: NEGATIVE

## 2010-08-11 LAB — FERRITIN: Ferritin: 10 ng/mL (ref 10–291)

## 2010-08-11 LAB — HAPTOGLOBIN: Haptoglobin: 244 mg/dL — ABNORMAL HIGH (ref 16–200)

## 2010-08-11 LAB — FOLATE: Folate: 9.6 ng/mL

## 2010-08-11 LAB — IRON AND TIBC
Iron: 16 ug/dL — ABNORMAL LOW (ref 42–135)
TIBC: 573 ug/dL — ABNORMAL HIGH (ref 250–470)
UIBC: 557 ug/dL

## 2010-08-11 LAB — CARDIAC PANEL(CRET KIN+CKTOT+MB+TROPI)
CK, MB: 1.2 ng/mL (ref 0.3–4.0)
Relative Index: 1.2 (ref 0.0–2.5)
Total CK: 98 U/L (ref 7–177)
Troponin I: 0.02 ng/mL (ref 0.00–0.06)

## 2010-08-11 LAB — WET PREP, GENITAL: Clue Cells Wet Prep HPF POC: NONE SEEN

## 2010-08-11 LAB — RETICULOCYTES
RBC.: 5.5 MIL/uL — ABNORMAL HIGH (ref 3.87–5.11)
Retic Count, Absolute: 77 10*3/uL (ref 19.0–186.0)

## 2010-08-11 LAB — BRAIN NATRIURETIC PEPTIDE
Pro B Natriuretic peptide (BNP): 265 pg/mL — ABNORMAL HIGH (ref 0.0–100.0)
Pro B Natriuretic peptide (BNP): 426 pg/mL — ABNORMAL HIGH (ref 0.0–100.0)

## 2010-08-11 LAB — TROPONIN I: Troponin I: 0.03 ng/mL (ref 0.00–0.06)

## 2010-08-23 ENCOUNTER — Inpatient Hospital Stay (INDEPENDENT_AMBULATORY_CARE_PROVIDER_SITE_OTHER)
Admission: RE | Admit: 2010-08-23 | Discharge: 2010-08-23 | Disposition: A | Discharge status: Home / Self Care | Payer: Medicaid Other | Source: Ambulatory Visit | Attending: Family Medicine | Admitting: Family Medicine

## 2010-08-23 DIAGNOSIS — N760 Acute vaginitis: Secondary | ICD-10-CM

## 2010-08-23 LAB — POCT URINALYSIS DIP (DEVICE)
Bilirubin Urine: NEGATIVE
Glucose, UA: NEGATIVE mg/dL
Ketones, ur: NEGATIVE mg/dL
Nitrite: NEGATIVE

## 2010-08-23 LAB — WET PREP, GENITAL: Trich, Wet Prep: NONE SEEN

## 2010-09-01 LAB — GC/CHLAMYDIA PROBE AMP, GENITAL
Chlamydia, DNA Probe: NEGATIVE
GC Probe Amp, Genital: NEGATIVE

## 2010-09-01 LAB — POCT URINALYSIS DIP (DEVICE)
Hgb urine dipstick: NEGATIVE
Ketones, ur: NEGATIVE mg/dL
Protein, ur: NEGATIVE mg/dL
Specific Gravity, Urine: 1.01 (ref 1.005–1.030)
pH: 6.5 (ref 5.0–8.0)

## 2010-09-01 LAB — WET PREP, GENITAL

## 2010-09-03 LAB — URINALYSIS, ROUTINE W REFLEX MICROSCOPIC
Hgb urine dipstick: NEGATIVE
Protein, ur: NEGATIVE mg/dL
Urobilinogen, UA: 1 mg/dL (ref 0.0–1.0)

## 2010-09-03 LAB — CBC
HCT: 32.3 % — ABNORMAL LOW (ref 36.0–46.0)
MCV: 68.6 fL — ABNORMAL LOW (ref 78.0–100.0)
Platelets: 283 10*3/uL (ref 150–400)
WBC: 7.3 10*3/uL (ref 4.0–10.5)

## 2010-09-03 LAB — COMPREHENSIVE METABOLIC PANEL
Albumin: 2.9 g/dL — ABNORMAL LOW (ref 3.5–5.2)
BUN: 8 mg/dL (ref 6–23)
CO2: 23 mEq/L (ref 19–32)
Chloride: 105 mEq/L (ref 96–112)
Creatinine, Ser: 0.64 mg/dL (ref 0.4–1.2)
GFR calc non Af Amer: >60 mL/min (ref >60)
Total Bilirubin: 0.5 mg/dL (ref 0.3–1.2)

## 2010-09-03 LAB — URINE MICROSCOPIC-ADD ON

## 2010-09-03 LAB — DIFFERENTIAL
Basophils Absolute: 0 10*3/uL (ref 0.0–0.1)
Eosinophils Absolute: 0.1 10*3/uL (ref 0.0–0.7)
Lymphocytes Relative: 27 % (ref 12–46)
Neutrophils Relative %: 66 % (ref 43–77)

## 2010-09-03 LAB — GC/CHLAMYDIA PROBE AMP, GENITAL: GC Probe Amp, Genital: NEGATIVE

## 2010-09-20 ENCOUNTER — Emergency Department (HOSPITAL_COMMUNITY)
Admission: EM | Admit: 2010-09-20 | Discharge: 2010-09-20 | Disposition: A | Discharge status: Home / Self Care | Payer: Medicaid Other | Attending: Emergency Medicine | Admitting: Emergency Medicine

## 2010-09-20 DIAGNOSIS — I10 Essential (primary) hypertension: Secondary | ICD-10-CM | POA: Insufficient documentation

## 2010-09-20 DIAGNOSIS — N760 Acute vaginitis: Secondary | ICD-10-CM | POA: Insufficient documentation

## 2010-09-20 DIAGNOSIS — Z8679 Personal history of other diseases of the circulatory system: Secondary | ICD-10-CM | POA: Insufficient documentation

## 2010-09-20 DIAGNOSIS — E7800 Pure hypercholesterolemia, unspecified: Secondary | ICD-10-CM | POA: Insufficient documentation

## 2010-09-20 DIAGNOSIS — I509 Heart failure, unspecified: Secondary | ICD-10-CM | POA: Insufficient documentation

## 2010-09-20 DIAGNOSIS — R109 Unspecified abdominal pain: Secondary | ICD-10-CM | POA: Insufficient documentation

## 2010-09-20 LAB — CBC
Hemoglobin: 13.3 g/dL (ref 12.0–15.0)
MCH: 29.1 pg (ref 26.0–34.0)
MCHC: 33.5 g/dL (ref 30.0–36.0)
Platelets: 231 10*3/uL (ref 150–400)
WBC: 6.5 10*3/uL (ref 4.0–10.5)

## 2010-09-20 LAB — DIFFERENTIAL
Eosinophils Relative: 3 % (ref 0–5)
Lymphocytes Relative: 39 % (ref 12–46)
Lymphs Abs: 2.5 10*3/uL (ref 0.7–4.0)
Monocytes Absolute: 0.5 10*3/uL (ref 0.1–1.0)
Neutro Abs: 3.3 10*3/uL (ref 1.7–7.7)

## 2010-09-20 LAB — URINALYSIS, ROUTINE W REFLEX MICROSCOPIC
Ketones, ur: NEGATIVE mg/dL
Nitrite: NEGATIVE
Protein, ur: NEGATIVE mg/dL
pH: 5.5 (ref 5.0–8.0)

## 2010-09-20 LAB — POCT I-STAT, CHEM 8
Chloride: 105 mEq/L (ref 96–112)
Creatinine, Ser: 0.7 mg/dL (ref 0.4–1.2)
Glucose, Bld: 94 mg/dL (ref 70–99)
HCT: 43 % (ref 36.0–46.0)
Potassium: 3.7 mEq/L (ref 3.5–5.1)

## 2010-09-20 LAB — WET PREP, GENITAL: Yeast Wet Prep HPF POC: NONE SEEN

## 2010-09-21 ENCOUNTER — Telehealth: Payer: Self-pay | Admitting: Internal Medicine

## 2010-09-21 LAB — GC/CHLAMYDIA PROBE AMP, GENITAL: GC Probe Amp, Genital: NEGATIVE

## 2010-09-21 NOTE — Telephone Encounter (Signed)
Patient needs clearance to have surgery on her hand. I will forward this to Dr. Graciela Husbands and his RN. Marland Kitchen

## 2010-09-21 NOTE — Telephone Encounter (Signed)
Pt needs surgical clearence for test on left hand crush injury will need to be lifting pls call pt case manager sheryl long fax [phone]

## 2010-09-22 LAB — URINE CULTURE
Colony Count: 50000
Culture  Setup Time: 201204291812

## 2010-09-22 NOTE — Telephone Encounter (Signed)
I spoke with Dr. Graciela Husbands about the patient and he states the she should be ok for hand surgery. I called to clarify with the patient who is doing her surgery. She states that it is her right hand and she is needing some testing done on it. I asked her for the phone number to clarify who this is being done with and she states that it is [employer] at [phone]. I will call them tomorrow since it is after 5pm to clarify what exactly is needed.

## 2010-09-24 NOTE — Telephone Encounter (Signed)
I have left a message at [phone] for the nurse case manager to call regarding the pt and her pending tests.

## 2010-09-30 NOTE — Telephone Encounter (Signed)
I have left the nurse case manager another to call me. Sherri Rad, RN, BSN

## 2010-10-06 ENCOUNTER — Encounter: Payer: Self-pay | Admitting: *Deleted

## 2010-10-06 NOTE — Telephone Encounter (Signed)
I spoke with Sheryl Long- Case Manager. She states the patient is needing clearance for a FCE (Functional Capacity Evaluation) on her hand only. They are testing to see what she is capable of with the hand. I will fax a clearance to her for this.

## 2010-10-06 NOTE — Assessment & Plan Note (Signed)
[facility]                         ELECTROPHYSIOLOGY OFFICE NOTE   NAME:Dillenbeck, ORVELLA DIGIULIO                       MRN:          [id number]  DATE:02/03/2007                            DOB:          February 02, 1966    Ms. Speakman is seen.  She has complaints of shortness of breath.  She has  a presumably resolved cardiomyopathy.  It was peripartum  __________  normal.  We will plan to get an echocardiogram.   We also changed her Cozaar to lisinopril and increased it a little bit,  as her blood pressure is a little bit high and reinitiated her Digitek.   Her examination today was notable for a blood pressure of 135/92 without  significant orthostatic change.  LUNGS:  Clear.  Heart sounds were regular.  EXTREMITIES:  Without edema.   Electrocardiogram was unrevealing.   PLAN:  As outlined above.     [name], MD, [facility]  Electronically Signed    SCK/MedQ  DD: 02/03/2007  DT: 02/04/2007  Job #: [id number]

## 2010-10-06 NOTE — Telephone Encounter (Signed)
Letter of clearance faxed to [phone]. Confirmation received.

## 2010-10-09 NOTE — Discharge Summary (Signed)
[place]. [facility]  Patient:    Chelsea Branch, Chelsea Branch                       MRN: [id number] Adm. Date:  [id number] Disc. Date: [id number] Attending:  Lenoria Farrier Dictator:   Chinita Pester, C.N.P. CC:         [facility]  Nathen May, M.D., [facility]   Discharge Summary  PRIMARY DIAGNOSIS:  Syncope.  SECONDARY DIAGNOSES: 1. Hypertension. 2. Tobacco abuse. 3. Congestive heart failure. 4. Postpartum cardiomyopathy.  HISTORY OF PRESENT ILLNESS:  This is a 45 year old female with past medical history of CHF and anemia who presents complaining of chest pain.  She was at work on break. She developed chest pain, then felt cold, numb, and woke up on the floor.  She states that it was very hot where she was.  She does not remember falling on the floor. Things went "black suddenly."  She denies palpitations but states occasionally will have them at home.  She also states episodes of dizziness which cause the need to lie down for it to resolve.  She has a history of postpartum cardiomyopathy, post delivery of twins eight years ago with return to her normal LV function.  PAST MEDICAL HISTORY:  This is positive for anemia, CHF, postpartum cardiomyopathy.  A 2-D echo in April of 1999 shows a near normal LV function, CHF with pregnancy.  ALLERGIES:  No known drug allergies.  MEDICATIONS UPON ADMISSION:  Altace and hydrochlorothiazide.  SOCIAL HISTORY:  She lives with her three sons.  She has positive tobacco use, three cigarettes per day, no alcohol.  FAMILY HISTORY:  No coronary artery disease.  HOSPITAL COURSE:  The patient was admitted.  She had an EP consult with Nathen May, M.D., [facility], who had recommended a Cardiolite and, if that was positive, catheterization prior to any EP testing.  An EP study was to be performed after results of catheterization, if negative.  Upon evaluating the patient, Dr. Graciela Husbands had seen the patient.  She did  have a prolonged QT upon admission but he did not think this was long QT syndrome.  The patient underwent a Cardiolite stress test which showed anterolateral ischemia versus infarct with an EF of 54%. She underwent a resting scan done the next day which showed anterior apical deficit, significant redistribution, although abnormality could be due to soft tissue attenuation, cannot rule out significant cardiac disorder.  The patient was to have a cardiac catheterization.  The patient requested to go home secondary to childcare issues. The patient was discharged to home to return on Tuesday, December 20, 2000, for a cardiac catheterization and possible tilt test follow-up.  NOTE:  The patient does have sutures in her head.  She was instructed to follow up with [facility] to have this removed next week. DD:  12/15/00 TD:  12/16/00 Job: [id number]

## 2010-10-09 NOTE — Cardiovascular Report (Signed)
[place]. [facility]  Patient:    Chelsea Branch, Chelsea Branch                       MRN: [id number] Proc. Date: 12/20/00 Adm. Date:  [id number] Attending:  Lenoria Farrier CC:         Gerrit Friends. Dietrich Pates, M.D. [facility]  Nathen May, M.D., [facility]  Cardiopulmonary Laboratory   Cardiac Catheterization  PROCEDURES PERFORMED:  Cardiac catheterization.  CLINICAL HISTORY:  The patient is 45 years old and has a postpartum cardiomyopathy which occurred eight years ago with the birth of her twin boys. She recently presented with chest pain and syncope while at work. The syncope was quite impressive.  She fell and hit her head on concrete and required stitches.  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  We evaluated her with an echocardiogram and a Cardiolite scan.  The Cardiolite scan and suggested anterolateral ischemia. The echocardiogram showed an ejection fraction between 40% and 50%.  She was scheduled for a catheterization.  DESCRIPTION OF PROCEDURE:  The procedure was performed via the right femoral artery using an arterial sheath and 6 French preformed coronary catheters.  A front wall arterial puncture was performed and Omnipaque contrast was used. The right femoral artery was closed with Perclose at the end of the procedure. The patient tolerated the procedure well and left the laboratory in satisfactory condition.  RESULTS:  The left main coronary artery:  The left main coronary artery was free of significant disease.  Left anterior descending:  The left anterior descending artery gave rise to two diagonal branches and four septal perforators.  These and the LAD proper were free of significant disease.  Circumflex artery:  The circumflex artery gave rise to a first marginal branch, second marginal branch, and a posterolateral branch.  These vessels were free of significant disease.  Right  coronary artery:  The right coronary is a moderately large vessel that gave rise to two right ventricular branches, a posterior descending branch, and four posterolateral branches.  There was 30% narrowing in the mid vessel.  LEFT VENTRICULOGRAPHY:  The left ventriculogram performed in the RAO projection showed mild global hypokinesis with an estimated ejection fraction of 50%.  There was frequent PVCs.  The aortic pressure was 135/84 with a mean of 107.  Left ventricular pressure is 135/15.  CONCLUSIONS:  Nonobstructive coronary artery disease with 30% narrowing in the mid right coronary artery, no significant obstruction in the circumflex and left anterior descending arteries, and very mild global hypokinesis.  RECOMMENDATIONS:  The patient presented with syncope.  She was initially seen in consultation with Dr. Graciela Husbands, who is not here today and I discussed the situation with Dr. Ladona Ridgel.  We will plan to evaluate her with a tilt test and if this is negative consider a loop recorder. DD:  12/20/00 TD:  12/20/00 Job: [id number]

## 2010-10-09 NOTE — Op Note (Signed)
   NAME:  Chelsea Branch, Chelsea Branch                          ACCOUNT NO.:  [id number]   MEDICAL RECORD NO.:  [id number]                   PATIENT TYPE:  OIB   LOCATION:  2856                                 FACILITY:  [facility]   PHYSICIAN:  [name], M.D.               DATE OF BIRTH:  September 15, 1965   DATE OF PROCEDURE:  03/25/2003  DATE OF DISCHARGE:                                 OPERATIVE REPORT   PREOPERATIVE DIAGNOSIS:  Recurrent syncope with previously implanted loop  recorder.   POSTOPERATIVE DIAGNOSIS:  Recurrent syncope with previously implanted loop  recorder.   OPERATION PERFORMED:  Explantation of a previously implanted loop recorder.   SURGEON:  [name], M.D.   DESCRIPTION OF PROCEDURE:  Following the obtaining of informed consent, the  patient was brought to the electrophysiology laboratory and placed on the  fluoroscopic table in supine position.  After routine prep and drape  lidocaine was infiltrated along the line of the previous incision and  carried down to the layer of the prepectoral loop recorder pocket.  The  pocket was opened.  The pocket was explanted. The retaining sutures were  removed.  The pocket was copiously irrigated with antibiotic containing  saline solution.  Hemostasis was obtained and the wound was then closed in  three layers in normal fashion.  The patient tolerated the procedure without  apparent complication.                                               [name], M.D.    SCK/MEDQ  D:  03/25/2003  T:  03/25/2003  Job:  [id number]

## 2010-10-09 NOTE — Cardiovascular Report (Signed)
NAME:  Chelsea Branch, Chelsea Branch                          ACCOUNT NO.:  [id number]   MEDICAL RECORD NO.:  [id number]                   PATIENT TYPE:  OIB   LOCATION:  2014                                 FACILITY:  [facility]   PHYSICIAN:  Arturo Morton. Riley Kill, M.D.             DATE OF BIRTH:  [birth year]   DATE OF PROCEDURE:  02/06/2003  DATE OF DISCHARGE:                              CARDIAC CATHETERIZATION   INDICATIONS:  Ms. Zou is Branch delightful 45 year old who has had Branch post  peripartum cardiomyopathy.  She recently stopped her medicines.  She was  admitted with some chest pain and had mild troponin elevation.  She also had  symptoms of heart failure.  The current study was done to assess coronary  anatomy.  [name], M.D. saw her.  She checked out AMA, but then  returned for Branch diagnostic catheterization.   PROCEDURE:  1. Left heart catheterization.  2. Selective coronary arteriography.  3. Selective left ventriculography.   DESCRIPTION OF PROCEDURE:  The procedure was performed from the femoral  artery using 6-French catheters.  She tolerated the procedure well.  There  were no complications.  She was taken to the holding area in satisfactory  clinical condition.   In addition, proximal root aortography was performed.   HEMODYNAMIC DATA:  1. Central aorta 112/75, mean 92.  2. Left ventricle 111/33/12.  3. No gradient on pullback across the aortic valve.   ANGIOGRAPHIC DATA:  1. On ventriculography the left ventricle appeared to be dilated.  There was     2+ mitral regurgitation.  There was global hypokinesis.  Ejection     fraction was calculated at 27%.  2. The aortic root demonstrated no dilatation and no significant aortic     regurgitation.  3. The left main coronary artery was free of critical disease.  4. The LAD coursed to the apex.  There were two diagonal branches, both of     which were somewhat smaller in caliber.  The LAD and its branches appear     to be free of  critical disease.  5. The circumflex provides Branch large intermediate vessel that is fairly smooth     and without significant narrowing.  There is Branch second marginal branch and     then Branch smaller AV circumflex, both of which appear to be free of critical     disease.  6. The right coronary artery demonstrates an eccentric area of plaquing in     the proximal to mid vessel.  The remainder of the artery is large in     caliber and free of critical disease.   CONCLUSIONS:  1. Nonischemic cardiomyopathy.  2. Mitral regurgitation possibly secondary to ventricular dilatation.  3. Mild coronary irregularities in the right coronary artery.    DISPOSITION:  She will need to be restarted on her medications.  Follow-up  with [name], M.D.  will be recommended.                                               Arturo Morton. Riley Kill, M.D.    TDS/MEDQ  D:  02/06/2003  T:  02/06/2003  Job:  [id number]   cc:   [facility]

## 2010-10-09 NOTE — Assessment & Plan Note (Signed)
[facility]                                   ON-CALL NOTE   NAME:Chelsea Branch, Chelsea Branch                         MRN:          [id number]  DATE:03/07/2006                            DOB:          February 10, 1966    REFERRING PHYSICIAN:  Jonelle Sidle, MD   PRIMARY CARDIOLOGIST:  Dr. Graciela Husbands.   SUMMARY/HISTORY:  Initially [name] called the answering service  approximately 2:30 on March 06, 2006.  I received a page from the  answering service stating that she was complaining of light-headedness.  I  called her back within 15 minutes of the page.  However, the patient was not  answering the phone.  I left a message on her voice mail to please call us  back when she returns home so that I may speak with her.  I received a  second page at approximately 6:40 from [name] in regards to the light-  headedness.  When I called her back, she answered the phone.  On speaking  with [name], she states that she has been light-headed for the preceding  2 to 3 days.  She denies any associated problems with chest discomfort,  shortness of breath, edema, palpitations, visual disturbances, or focal  neurological difficulties.  However, on further questioning when I ask Ms.  Newbern specifically why she was followed by Dr. Graciela Husbands, all she could tell me  was that she had heart problems.  She could not tell me specifically what  kind of heart problems she had.  She states that she has not seen Dr. Graciela Husbands  for at least a year or 2.  She also states that she does not have a primary  care physician.  I asked her if she has checked her blood pressure since she  has been having the light-headedness.  She says, no she is not and she does  not have the capability to do so.  She denies any loss of consciousness.   I advised [name] that, if she wished to seek evaluation that the only  way she would be able to be evaluated on Sunday evening would to be to come  to the emergency  room and that the emergency room physician could evaluate  her, and if he found a cardiac etiology to her symptoms, that he could  contact us.  I also recommended that, if she did not want to come to the  emergency room, she could call our office in the morning to arrange a  followup appointment.  At the time when I was discussing this with her, she  was not clear as to which recommendation she would follow.  I asked her to  please call back if she decided to go to the emergency room so  that we could notify the emergency room and she may not have as long of a  wait.  She stated that she would do so.     ______________________________  Joellyn Rued, PA-C    ______________________________  Jonelle Sidle, MD   EW/MedQ  DD:  03/07/2006 DT:  03/08/2006 Job #:  [id number]

## 2010-10-09 NOTE — Discharge Summary (Signed)
NAME:  Chelsea Branch, Chelsea A.                         ACCOUNT NO.:  [id number]   MEDICAL RECORD NO.:  [id number]                   PATIENT TYPE:  OIB   LOCATION:                                       FACILITY:  [facility]   PHYSICIAN:  [name], M.D.               DATE OF BIRTH:  1965-12-03   DATE OF ADMISSION:  02/06/2003  DATE OF DISCHARGE:  02/07/2003                                 DISCHARGE SUMMARY   HISTORY OF PRESENT ILLNESS:  This is a 45 year old female who has had  postpartum cardiomyopathy.  She recently stopped medications.  She was  admitted with chest pain, mild troponin elevation, and also had symptoms of  heart failure.  The patient underwent a cardiac catheterization to assess  coronary anatomy.  She had seen Dr. Graciela Husbands; the patient had just recently  signed out AMA but returned for diagnostic catheterization.   PAST MEDICAL HISTORY:  1. Nonischemic cardiomyopathy and mitral regurgitation, possibly secondary     to ventricular dilatation.  2. Mild coronary irregularities in the right coronary artery.   HOSPITAL COURSE:  The patient had cardiac catheterization.  She tolerated  the procedure well, had no immediate postoperative complications.  A 2-D  echocardiogram from prior admission showed a summary inadequate for  evaluation LV regional wall motion, very limited images, severe hypokinesis  of the inferior posterior wall.  EF in 30% range.  RV not seen well enough  to assess.  The impression were very limited images, severe hypokinesis of  the inferior posterior wall, EF in 30% range, RV not seen well enough to  assess.  Cardiac catheterization performed showed 2+ MR, LV 27%, with global  hypokinesis.  She had 20 to 30% stenosis of the right coronary artery.  The  patient was discharged home the following day in stable condition on the  following medications.   DISCHARGE MEDICATIONS:  1. Cozaar 50 mg daily.  2. Hydrochlorothiazide 25 mg daily.  3. Digoxin 0.125 mg  daily.  4. Coreg 3.125 mg every 12 hours.   DISCHARGE INSTRUCTIONS:  The patient was given a supply of  hydrochlorothiazide and digoxin per hospital for at least a month.  She was  given a supply of Coreg through the [facility] office for one month and was  referred to [facility] for further medications.  The patient was to have  Tylenol 1 to 2 tablets every 4 to 6 hours as needed for pain.  Low-fat, low-  salt, low-cholesterol diet.  She was to call if she developed a lump or any  drainage from her groin.  An appointment was scheduled for the physician  assistant at [facility] on September 27 at 4 p.m. for a groin check.  The  patient would also need a repeat 2-D echocardiogram which would be scheduled  at that appointment.  She was to be seen at the CHF clinic October 4  at 12  noon at [facility] for medication titration and follow with Dr. Sherryl Manges on  October 19 at 10:45 Branchm.       DS/MEDQ  D:  02/07/2003  T:  02/09/2003  Job:  [id number]   cc:   [facility]

## 2010-10-09 NOTE — Cardiovascular Report (Signed)
. [facility]  Patient:    Chelsea Branch, Chelsea Branch Visit Number: [id number] MRN: [id number]          Service Type: CAT Location: [facility] 2859 [id number] Attending Physician:  Nathen May Proc. Date: 01/13/01 Adm. Date:  01/13/2001 Disc. Date: 01/13/2001   CC:         [facility]   Cardiac Catheterization  DESCRIPTION OF PROCEDURE:  Following the obtainment of informed consent, the patient was brought to the cardiac catheterization laboratory and placed on the fluoroscopic table in a supine position.  After routine prep and drape of the midsternal area, an incision was made 2 cm lateral to the sternum and 2 cm below the left clavicle.  This area had been previously mapped.  The incision was made and carried down to the layer of the prepectoral fascia using electrocautery and sharp dissection.  A pocket was formed similarly and hemostasis was obtained.  Thereafter, a [employer] Review Plus 9526 loupe recorder was implanted. Serial number [id number].  It was secured in a cephalad location with two 2-0 sutures.  The wound was then closed in three layers in a normal fashion.  The wound was washed, dried, and benzoin and Steri-Strips dressing was then applied.  Needle counts, sponge counts, and instrument counts were correct at the end of the procedure according to the staff.  The patient tolerated the procedure without apparent complications. Attending Physician:  Nathen May DD:  01/13/01 TD:  01/16/01 Job: [id number]

## 2010-10-09 NOTE — Discharge Summary (Signed)
   NAME:  Chelsea Branch, Chelsea A.                         ACCOUNT NO.:  [id number]   MEDICAL RECORD NO.:  [id number]                  PATIENT TYPE:  INP   LOCATION:                                       FACILITY:  [facility]   PHYSICIAN:  Chinita Pester, C.R.N.P. [facility]           DATE OF BIRTH:  August 02, 1965   DATE OF ADMISSION:  02/03/2003  DATE OF DISCHARGE:  02/04/2003                                 DISCHARGE SUMMARY   PRIMARY DIAGNOSIS:  Dyspnea.   SECONDARY DIAGNOSES:  1. Postpartum cardiomyopathy.  2. Hypertension.  3. Tobacco abuse.  4. Status post loop insertion.   HISTORY OF PRESENT ILLNESS:  This is a 45 year old female with a past  medical history of postpartum cardiomyopathy.  At the birth of her twins,  she was placed on an ACE inhibitor.  She states that her heart had returned  to normal after nine months.  She saw Dr. Graciela Husbands two months ago and had a  loop in place secondary to a history of syncope in 2002.  Catheterization at  that time showed nonobstructive disease.  The patient is now admitted with a  complaint of a one to two-month history of progressive dyspnea, four-pillow  orthopnea, PND, and chest heaviness that was worse when lying flat.  The  patient has a friend with HIV and states that she is afraid that she had  contracted an airborne illness.  The last TB was a few months ago.  The  patient was admitted to the hospital for dyspnea with a positive D-dimer of  0.87.  The BNP was 384.  Initial cardiac enzymes showed a CK of 27 with an  MB of 3.1 and a troponin of 1.72.   HOSPITAL COURSE:  A 2-D echocardiogram was performed.  The patient was  placed on antibiotics for pneumonia.  A consult was done with dietary for  patient education on a low-sodium diet.  Case management was consulted for  help with medications.  A 2-D echocardiogram was performed which showed an  EF of 45-50%.  CT scan of the lower extremities showed no DVT.  CT of the  chest showed new PE with patchy  airspace disease most consistent with  pneumonia.  On February 05, 2003, the patient needed to leave for family  reasons and signed out AMA.                                                Chinita Pester, C.R.N.P. [facility]    DS/MEDQ  D:  03/06/2003  T:  03/06/2003  Job:  [id number]   cc:   [name], M.D.

## 2010-10-09 NOTE — H&P (Signed)
NAME:  Chelsea Branch, Chelsea Branch                          ACCOUNT NO.:  [id number]   MEDICAL RECORD NO.:  [id number]                   PATIENT TYPE:  INP   LOCATION:  3315                                 FACILITY:  [facility]   PHYSICIAN:  [name], M.D.               DATE OF BIRTH:  02-03-1966   DATE OF ADMISSION:  02/03/2003  DATE OF DISCHARGE:                                HISTORY & PHYSICAL   REASON FOR ADMISSION:  Chelsea Branch was seen in the emergency room because  of progressive orthopnea and nocturnal dyspnea.  She was found to have a  positive troponin and we were called.   HISTORY OF PRESENT ILLNESS:  Chelsea Branch is a 45 year old woman who had a  history of cardiomyopathy at the time of the birth of her twins.  I met some  time a few years back when she had syncope.  At that time, left ventricular  ejection fraction had normalized.  She was given an implantable loop without  recurrences.  She had been taking an ARB and diuretics.  Her Medicaid ran  out as she has been working for the last 21 months.  She stopped taking her  medications and concurrent with that over the last 2-3 months she has had  problems with progressive exercise intolerance, nocturnal dyspnea and  orthopnea so she is currently trying to sleep on three pillows and not doing  a very good job of that.   Her dietary sodium intake has been unrestricted to date.   She also complains of exertional tightness which accompanies her shortness  of breath.  There has not been significant peripheral edema and there has  been no recurrent syncope.   PAST MEDICAL HISTORY:  Her past medical history is outlined on intake note  as is her review of systems.   MEDICATIONS:  Hydrochlorothiazide and Cozaar which she has not been taking.   ALLERGIES:  HER MEDICATION INTOLERANCES INCLUDE ACE INHIBITORS WITH A COUGH.   SOCIAL HISTORY:  She is single, she has three sons - a 73 -year-old and 62-  year-old twins.  She does not use  cigarettes, alcohol, or recreational  drugs.   PHYSICAL EXAMINATION:  GENERAL:  She is a middle aged African-American  female in mild respiratory distress.  VITAL SIGNS:  Her blood pressure is 148/89, her pulse is 93-100,  respirations are 20 but not labored currently.  HEENT:  No icterus, no xanthoma.  NECK:  Her neck veins were 7-8 cm.  Her carotids were brisk and full  bilaterally, without bruits.  BACK:  Without kyphosis or scoliosis.  LUNGS:  She had bibasilar crackles.  CARDIOVASCULAR:  Her PMI was displaced to the anterior axillary line.  S1  was diminished, S2 was normal.  ABDOMEN:  Soft with active bowel sounds.  EXTREMITIES:  Femoral pulses were 2+, there was trace peripheral edema.  Distal pulses were intact.  There was no clubbing or cyanosis.  NEUROLOGIC:  Grossly normal.   Electrocardiogram demonstrated sinus rhythm with diffuse T wave inversions.   LABORATORY DATA:  Notable for a potassium of 3.1, a troponin of 1.72, a BMP  of 384, D-dimer of 0.87, a CPK of 276 with an MB fraction of 3.1.  Chest x-  ray demonstrated cardiomegaly with mild interstitial edema.   IMPRESSION:  1. Congestive heart failure, primarily left ventricular with an elevated     BMP.  2. Acute coronary syndrome with an elevated troponin.  3. Prior cardiomyopathy with subsequent normalization of her ejection     fraction.  4. History of syncope, status post implantable loop.  5. Financial difficulties.  6. ? exposure to micro bacterial infection.   DISCUSSION:  Ms. Moshier has congestive failure.  I fear that this is a  relapse of her prior cardiomyopathy given the severity of her presenting  symptoms and cardiomegaly on x-ray and displaced PMI on examination.  We  will need to evaluate her left ventricular systolic function.   I am puzzled by the elevated troponin.  She is sufficiently active that I  think the likelihood of a pulmonary embolism is pretty low but we will  exclude that by CT  scanning in the setting of her D-dimer.  In the event  that this is not abnormal, we may need to reconsider repeat catheterization  which she had done a couple of years ago that was normal.   RECOMMENDATIONS:  1. Initiate heparin.  2. Continue IV Lasix.  3. Resume ARB.  4. Serial enzymes.  5. CT to rule out pulmonary embolism.  6. Echocardiogram for left ventricular function.  7. May need catheterization given positive troponin.  8. __________ consult.  9. __________ hospital.  10.      Dietary consultation.                                                [name], M.D.    SCK/MEDQ  D:  02/03/2003  T:  02/04/2003  Job:  [id number]

## 2010-10-09 NOTE — Discharge Summary (Signed)
NAME:  Chelsea Branch, Chelsea Branch                          ACCOUNT NO.:  [id number]   MEDICAL RECORD NO.:  [id number]                   PATIENT TYPE:  INP   LOCATION:  4735                                 FACILITY:  [facility]   PHYSICIAN:  [name], M.D.               DATE OF BIRTH:  [birth year]   DATE OF ADMISSION:  04/02/2003  DATE OF DISCHARGE:  04/03/2003                                 DISCHARGE SUMMARY   HISTORY OF PRESENT ILLNESS:  The patient is a 45 year old female who was  admitted with near syncope while sitting on a commode for approximately 15  minutes.  She got up and began to have chest pain and leg pain with an  increased heart rate, palpitations and near syncope.  She layed down on the  floor, no trauma, and 911 was called, EMS.  The patient was transferred to  the emergency room.  The symptoms resolved spontaneously.  The patient was  noted to be in normal sinus rhythm.   LABORATORY DATA:  Upon admission, WBC was 8.9, H&H 13.3 and 40.3 with a  platelet count of 284.  Sodium upon admission was 138 with potassium of 2.9,  chloride 98, CO2 29.  Glucose was 94.  BUN 8, creatinine 0.7.  LFT's were  within normal limits.  CK was 235 with an MB of 2.3, and a troponin of less  than 0.05.  The second set of cardiac enzymes showed a CK of 234 with MB of  1.6, and troponin of 2.32.  Previous troponin was 2.37.  TSH was 0.449.  Digoxin level was 0.3.   The patient received 40 mEq of potassium x2 the evening of admission.  The  following morning, BMET was repeated.  Sodium 134, with a potassium of 2.9.  Chloride was 100, CO2 was 27.  Glucose was 15.  BUN was 4.  Creatinine was  0.6.  Calcium was 8.7 with a magnesium of 2.1.  The patient was given  another 40 mEq of potassium that a.m. with a 20 more mEq dosage to be taken  at home at 2 p.m.   The patient was seen by Dr. [name].  The episode was thought to be  vasovagal.  Enzymes were positive.  Troponins were chronically  positive.  The patient had a catheterization in the past two months which was negative  for coronary artery disease.  The patient did state that she has not been  routinely taking her potassium on a daily basis, with her  hydrochlorothiazide.  The patient was instructed on the importance of taking  all of her medications as instructed.  She was to have a repeat BMET drawn  on November 15 while she was in the office at the pacemaker clinic.   DISCHARGE MEDICATIONS:  The patient was discharged on all of her previous  medications:  1. Cozaar 50 mg, one half tablet  daily.  2. Digoxin 1.25 mg daily.  3. Hydrochlorothiazide 25 a day.  4. Coreg 12.5 one-half tablet b.i.d. and potassium 40 mEq daily as she had     been taking before.  The patient stated she was unsure if she was taking     40 mEq of 20 mEq.  She was instructed to take whichever dose that she had     previously been on before admission.    FOLLOWUP:  1. She was to have a BMET drawn on November 15 in the office.  2. She was to see the pacemaker clinic at [facility] on November 15, at 9:30     a.m.      Chinita Pester, C.R.N.P. [facility]                 [name], M.D.    DS/MEDQ  D:  04/03/2003  T:  04/04/2003  Job:  [id number]   cc:   [name], M.D.   Rollene Rotunda, M.D.   [facility]

## 2010-10-09 NOTE — H&P (Signed)
NAME:  Chelsea Branch, Chelsea Branch                          ACCOUNT NO.:  [id number]   MEDICAL RECORD NO.:  [id number]                   PATIENT TYPE:  INP   LOCATION:  1831                                 FACILITY:  [facility]   PHYSICIAN:  Vida Roller, M.D.                DATE OF BIRTH:  [birth year]   DATE OF ADMISSION:  04/02/2003  DATE OF DISCHARGE:                                HISTORY & PHYSICAL   PRIMARY CARE Ceonna Frazzini:  [facility].   CARDIOLOGIST:  Dr. Berton Mount at [facility].   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   HISTORY OF PRESENT ILLNESS:  [name] came home from work today after  having an uneventful day at work, sat down on the commode around 4:30, was  trying to have a bowel movement, has been quite constipated for the last few  days, while on the commode had an episode of leg pain which progressed to  chest discomfort.  She felt dizzy, her heart rate started to increase, she  stood up from the commode, her dizziness increased as did her heart rate,  she had severe palpitations.  She laid down on the ground, felt better,  called 911, was taken to the emergency department here at [facility] where  her symptoms spontaneously resolved.  Total duration of her discomfort in  her chest was probably less than 2 minutes.  She reports multiple episodes  of near syncope or syncope.  She has been evaluated by Dr. Graciela Husbands in the past  for this and recently had an explantation of an implantable loop device  which was in for approximately 2 years, this was done on March 25, 2003.   PAST MEDICAL HISTORY:  Significant for postpartum cardiomyopathy first  diagnosed in 1994 after the birth of twins, this hospitalization was  complicated by a critical illness, she had a pneumonia associated with heart  failure and was quite ill, did not require intubation at that time but since  then has developed  cardiomyopathy, has an ejection fraction of approximately  25%.  She has a history of hypertension.  She has nonobstructive coronary  disease most recently catheterized in September 2004 with a single lesion in  her right coronary artery documented at 30%, her ejection fraction at that  time was calculated at 27%.  She also had a heart catheterization in 2002  which showed nonobstructive coronary disease with depressed LV function.  She carries a diagnosis of nonischemic cardiomyopathy.   CURRENT MEDICATIONS:  Her medications currently are digoxin 0.125 mg a day,  Coreg 12.5 mg twice a day, hydrochlorothiazide 25 mg a day, Cozaar 50 mg she  takes a half a tablet a day and 40 mg of potassium twice a day.   SOCIAL HISTORY:  She lives in [place].  She has three children all of  whom are boys, a 81 year old and the two twins are 10 now.  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  She smokes two cigarettes a day and has for many years, probably has about a  10 or 15 pack-year smoking history, does not drink alcohol and does not use  drugs, does drink caffeine.   FAMILY HISTORY:  Her mother is still alive and she does not really speak to  her mother much so is uncertain of her mother's health status.  Her father  died of a CVA at an unknown age.  She has five sisters and one brother all  of whom are healthy.   REVIEW OF SYSTEMS:  Noncontributory and really in general nonspecific.   ALLERGIES:  She is intolerant to ALTACE but otherwise, has no known drug  allergies.   PHYSICAL EXAMINATION:  VITAL SIGNS:  On physical exam today her blood  pressure is 83/46, her heart rate is 70, her respiratory rate is 20, and she  is afebrile.  GENERAL:  She is a mildly obese black female who is a reasonably difficult  historian and giving very vague answers.  HEAD/EARS/EYES/NOSE AND THROAT:  Examination is unremarkable.  She is  normocephalic and  atraumatic.  I did not do a funduscopic exam.  NECK:  Her neck is supple.  She has no jugular venous distention or carotid  bruits.  CHEST:  Her chest is clear to auscultation bilaterally.  CARDIAC:  Her cardiac exam reveals a mildly displaced point of maximal  impulse inferiorly and laterally with no lifts or thrills, first and second  heart sounds are normal, there is no third or fourth heart sound, there is a  2/6 holosystolic murmur heard best at the apex which does not radiate.  ABDOMEN:  Abdomen is soft, nontender, normoactive bowel sounds.  She has a  small heeling incision in her anterior chest which has no discharge.  GU/RECTAL:  Exams are deferred.  EXTREMITIES:  Her extremities are without significant clubbing, cyanosis, or  edema.  She has 2+ pulses throughout.  NEUROLOGIC:  Her neurologic exam is nonfocal.   LABORATORIES:  Her chest x-ray shows no acute pulmonary disease; she has a  slightly enlarged cardiac silhouette.  Her electrocardiogram shows sinus  rhythm with first degree AV block at a rate of 72 with a normal axes, her PR  interval is 225 Msec, her QRS duration is 92 Msec, her QT corrected is 446  Msec, she has nonspecific ST-T wave changes consistent with digoxin.  Her  laboratories are all still pending.  Most recently she had an ISTAT which  showed a sodium of 135, a potassium of 2.9, chloride of 89, bicarb of 29,  BUN of 8, creatinine of 0.7, and her calcium is 8.7.  White blood cell count  of 8.9, hemoglobin of 13.3, hematocrit of 40, platelets of 284,000.  She had  a single set of cardiac enzymes which was an [place] which was inconsistent  with acute myocardial infarction.   ASSESSMENT:  This is a woman with a postpartum cardiomyopathy who has had an  episode of near syncope.  She has atypical chest discomfort, ongoing tobacco  abuse, she has hyperkalemia probably secondary to her hydrochlorothiazide.  PLAN:  Our plan is to observe her in telemetry for 24  hours, get an  echocardiogram in the morning, check/cycle her enzymes and replace her  potassium and hold her hydrochlorothiazide.  Vida Roller, M.D.    JH/MEDQ  D:  04/02/2003  T:  04/02/2003  Job:  [id number]

## 2010-11-20 ENCOUNTER — Telehealth: Payer: Self-pay | Admitting: Internal Medicine

## 2010-11-20 MED ORDER — LOSARTAN POTASSIUM 50 MG PO TABS: 50.0000 mg | ORAL_TABLET | Freq: Every day | ORAL | Status: DC

## 2010-11-20 NOTE — Telephone Encounter (Signed)
Pt needs losartan to be call in to  [facility] in [address] # [phone].

## 2010-12-31 ENCOUNTER — Inpatient Hospital Stay (INDEPENDENT_AMBULATORY_CARE_PROVIDER_SITE_OTHER)
Admission: RE | Admit: 2010-12-31 | Discharge: 2010-12-31 | Disposition: A | Discharge status: Home / Self Care | Payer: Medicaid Other | Source: Ambulatory Visit | Attending: Family Medicine | Admitting: Family Medicine

## 2010-12-31 DIAGNOSIS — N390 Urinary tract infection, site not specified: Secondary | ICD-10-CM

## 2010-12-31 LAB — POCT URINALYSIS DIP (DEVICE)
Ketones, ur: NEGATIVE mg/dL
Protein, ur: 30 mg/dL — AB
Specific Gravity, Urine: <=1.005 (ref 1.005–1.030)
pH: 5.5 (ref 5.0–8.0)

## 2011-01-30 ENCOUNTER — Telehealth: Payer: Self-pay | Admitting: Physician Assistant

## 2011-01-30 NOTE — Telephone Encounter (Signed)
Pharmacy calling for refill in K+. I cannot verify patient is still taking lasix and follow up. Please call Monday morning to follow up on refills. Tereso Newcomer, PA-C

## 2011-02-01 ENCOUNTER — Other Ambulatory Visit: Payer: Self-pay | Admitting: *Deleted

## 2011-02-01 MED ORDER — POTASSIUM CHLORIDE CRYS ER 20 MEQ PO TBCR: 20.0000 meq | EXTENDED_RELEASE_TABLET | Freq: Two times a day (BID) | ORAL | Status: DC

## 2011-02-01 MED ORDER — POTASSIUM CHLORIDE CRYS ER 20 MEQ PO TBCR: 20.0000 meq | EXTENDED_RELEASE_TABLET | Freq: Every day | ORAL | Status: DC

## 2011-02-01 NOTE — Telephone Encounter (Signed)
rx sent in today for K+ 20 per pt request. Danielle Rankin

## 2011-02-01 NOTE — Telephone Encounter (Signed)
Resent K+ today for the correct dose. Danielle Rankin

## 2011-02-03 ENCOUNTER — Other Ambulatory Visit: Payer: Self-pay | Admitting: *Deleted

## 2011-02-03 MED ORDER — POTASSIUM CHLORIDE CRYS ER 20 MEQ PO TBCR: 20.0000 meq | EXTENDED_RELEASE_TABLET | Freq: Every day | ORAL | Status: DC

## 2011-02-03 MED ORDER — FUROSEMIDE 20 MG PO TABS: 20.0000 mg | ORAL_TABLET | Freq: Two times a day (BID) | ORAL | Status: DC

## 2011-02-12 MED ORDER — FUROSEMIDE 20 MG PO TABS: 20.0000 mg | ORAL_TABLET | Freq: Two times a day (BID) | ORAL | Status: DC

## 2011-02-12 NOTE — Telephone Encounter (Signed)
Lasix  20 mg . [facility] [employer] .

## 2011-02-23 LAB — POCT URINALYSIS DIP (DEVICE)
Bilirubin Urine: NEGATIVE
Hgb urine dipstick: NEGATIVE
Ketones, ur: NEGATIVE
Specific Gravity, Urine: 1.015
pH: 8.5 — ABNORMAL HIGH

## 2011-03-03 ENCOUNTER — Other Ambulatory Visit (HOSPITAL_COMMUNITY): Payer: Self-pay | Admitting: Family Medicine

## 2011-03-03 DIAGNOSIS — N939 Abnormal uterine and vaginal bleeding, unspecified: Secondary | ICD-10-CM

## 2011-03-05 ENCOUNTER — Other Ambulatory Visit (HOSPITAL_COMMUNITY): Payer: Medicaid Other

## 2011-07-01 ENCOUNTER — Emergency Department (HOSPITAL_COMMUNITY)
Admission: EM | Admit: 2011-07-01 | Discharge: 2011-07-01 | Disposition: A | Discharge status: Home / Self Care | Payer: Medicaid Other | Attending: Emergency Medicine | Admitting: Emergency Medicine

## 2011-07-01 ENCOUNTER — Encounter (HOSPITAL_COMMUNITY): Payer: Self-pay

## 2011-07-01 DIAGNOSIS — R6883 Chills (without fever): Secondary | ICD-10-CM | POA: Insufficient documentation

## 2011-07-01 DIAGNOSIS — J3489 Other specified disorders of nose and nasal sinuses: Secondary | ICD-10-CM | POA: Insufficient documentation

## 2011-07-01 DIAGNOSIS — IMO0001 Reserved for inherently not codable concepts without codable children: Secondary | ICD-10-CM | POA: Insufficient documentation

## 2011-07-01 DIAGNOSIS — R059 Cough, unspecified: Secondary | ICD-10-CM | POA: Insufficient documentation

## 2011-07-01 DIAGNOSIS — J329 Chronic sinusitis, unspecified: Secondary | ICD-10-CM

## 2011-07-01 DIAGNOSIS — R05 Cough: Secondary | ICD-10-CM | POA: Insufficient documentation

## 2011-07-01 HISTORY — DX: Cerebral infarction, unspecified: I63.9

## 2011-07-01 HISTORY — DX: Essential (primary) hypertension: I10

## 2011-07-01 MED ORDER — AMOXICILLIN 500 MG PO CAPS: 1000.0000 mg | ORAL_CAPSULE | Freq: Two times a day (BID) | ORAL | Status: DC

## 2011-07-01 NOTE — ED Provider Notes (Signed)
Medical screening examination/treatment/procedure(s) were conducted as a shared visit with non-physician practitioner(s) and myself.  I personally evaluated the patient during the encounter   Kyreese Chio L Bona Hubbard, MD 07/01/11 1522 

## 2011-07-01 NOTE — ED Provider Notes (Signed)
History     CSN: [id number]  Arrival date & time 07/01/11  0865   First MD Initiated Contact with Patient 07/01/11 0935      No chief complaint on file.   (Consider location/radiation/quality/duration/timing/severity/associated sxs/prior treatment) Patient is a 46 y.o. female presenting with sinusitis. The history is provided by the patient.  Sinusitis  This is a new problem. The current episode started more than 2 days ago. The problem has not changed since onset.The pain is mild. The pain has been constant since onset. Associated symptoms include chills, congestion, sinus pressure and cough. Pertinent negatives include no ear pain, no sore throat and no shortness of breath. She has tried other medications for the symptoms. The treatment provided no relief.    No past medical history on file.  No past surgical history on file.  No family history on file.  History  Substance Use Topics  . Smoking status: Not on file  . Smokeless tobacco: Not on file  . Alcohol Use: Not on file    OB History    No data available      Review of Systems  Constitutional: Positive for chills. Negative for fever.  HENT: Positive for congestion and sinus pressure. Negative for ear pain, sore throat and neck stiffness.   Respiratory: Positive for cough. Negative for shortness of breath.   Cardiovascular: Negative.   Gastrointestinal: Negative.  Negative for nausea, vomiting and abdominal pain.  Musculoskeletal: Positive for myalgias.  Skin: Negative for rash.    Allergies  Ace inhibitors  Home Medications   Current Outpatient Rx  Name Route Sig Dispense Refill  . ASPIRIN BUFFERED 325 MG PO TABS Oral Take 325 mg by mouth daily.    Marland Kitchen CARVEDILOL 6.25 MG PO TABS Oral Take 6.25 mg by mouth 2 (two) times daily with a meal.    . OMEGA-3 FATTY ACIDS 1000 MG PO CAPS Oral Take 1 g by mouth 2 (two) times daily.    . FUROSEMIDE 20 MG PO TABS Oral Take 20 mg by mouth 2 (two) times daily.    . IRON  PO TABS Oral Take 1 tablet by mouth 2 (two) times daily.    Marland Kitchen LOSARTAN POTASSIUM 50 MG PO TABS Oral Take 50 mg by mouth daily.    Marland Kitchen POTASSIUM CHLORIDE CRYS ER 20 MEQ PO TBCR Oral Take 20 mEq by mouth daily.    Marland Kitchen PRAVASTATIN SODIUM 20 MG PO TABS Oral Take 20 mg by mouth daily.      There were no vitals taken for this visit.  Physical Exam  Constitutional: She appears well-developed and well-nourished.  HENT:  Head: Normocephalic.  Right Ear: External ear normal.  Left Ear: External ear normal.  Nose: Mucosal edema present. Right sinus exhibits frontal sinus tenderness. Left sinus exhibits frontal sinus tenderness.  Mouth/Throat: Oropharynx is clear and moist.  Neck: Normal range of motion. Neck supple.  Cardiovascular: Normal rate and normal heart sounds.   No murmur heard. Pulmonary/Chest: Effort normal and breath sounds normal. She has no wheezes. She has no rales.  Abdominal: Soft. Bowel sounds are normal. She exhibits no distension. There is no tenderness.  Musculoskeletal: Normal range of motion.  Lymphadenopathy:    She has no cervical adenopathy.  Skin: Skin is warm and dry. No pallor.    ED Course  Procedures (including critical care time)  Labs Reviewed - No data to display No results found.   No diagnosis found.    MDM  Rodena Medin, PA-C 07/01/11 1003

## 2011-07-01 NOTE — ED Notes (Signed)
Pt c/o frontal headache, sinus pressure, fever, and chills x3 days.

## 2011-07-01 NOTE — ED Notes (Signed)
Pt c/o frontal headache, sinus pressure, fever, chills, dry cough and body aches x3 days. Pt states "I feel like I have the flu."

## 2011-07-01 NOTE — ED Notes (Addendum)
MD at bedside. Melvenia Beam EDPA

## 2011-07-03 ENCOUNTER — Emergency Department (HOSPITAL_COMMUNITY)
Admission: EM | Admit: 2011-07-03 | Discharge: 2011-07-03 | Disposition: A | Discharge status: Home / Self Care | Payer: Medicaid Other | Attending: Emergency Medicine | Admitting: Emergency Medicine

## 2011-07-03 ENCOUNTER — Encounter (HOSPITAL_COMMUNITY): Payer: Self-pay

## 2011-07-03 ENCOUNTER — Emergency Department (HOSPITAL_COMMUNITY): Payer: Medicaid Other

## 2011-07-03 DIAGNOSIS — R5381 Other malaise: Secondary | ICD-10-CM | POA: Insufficient documentation

## 2011-07-03 DIAGNOSIS — R059 Cough, unspecified: Secondary | ICD-10-CM | POA: Insufficient documentation

## 2011-07-03 DIAGNOSIS — R0982 Postnasal drip: Secondary | ICD-10-CM | POA: Insufficient documentation

## 2011-07-03 DIAGNOSIS — I10 Essential (primary) hypertension: Secondary | ICD-10-CM | POA: Insufficient documentation

## 2011-07-03 DIAGNOSIS — J3489 Other specified disorders of nose and nasal sinuses: Secondary | ICD-10-CM | POA: Insufficient documentation

## 2011-07-03 DIAGNOSIS — J40 Bronchitis, not specified as acute or chronic: Secondary | ICD-10-CM | POA: Insufficient documentation

## 2011-07-03 DIAGNOSIS — R079 Chest pain, unspecified: Secondary | ICD-10-CM | POA: Insufficient documentation

## 2011-07-03 DIAGNOSIS — R0602 Shortness of breath: Secondary | ICD-10-CM | POA: Insufficient documentation

## 2011-07-03 DIAGNOSIS — R05 Cough: Secondary | ICD-10-CM

## 2011-07-03 DIAGNOSIS — Z8679 Personal history of other diseases of the circulatory system: Secondary | ICD-10-CM | POA: Insufficient documentation

## 2011-07-03 DIAGNOSIS — F17200 Nicotine dependence, unspecified, uncomplicated: Secondary | ICD-10-CM | POA: Insufficient documentation

## 2011-07-03 DIAGNOSIS — I509 Heart failure, unspecified: Secondary | ICD-10-CM | POA: Insufficient documentation

## 2011-07-03 MED ORDER — HYDROCOD POLST-CHLORPHEN POLST 10-8 MG/5ML PO LQCR: 5.0000 mL | Freq: Two times a day (BID) | ORAL | Status: DC

## 2011-07-03 MED ORDER — ALBUTEROL SULFATE HFA 108 (90 BASE) MCG/ACT IN AERS
2.0000 | INHALATION_SPRAY | RESPIRATORY_TRACT | Status: DC | PRN
  Administered 2011-07-03: 2 via RESPIRATORY_TRACT
  Filled 2011-07-03: qty 6.7

## 2011-07-03 MED ORDER — HYDROCOD POLST-CHLORPHEN POLST 10-8 MG/5ML PO LQCR
5.0000 mL | Freq: Once | ORAL | Status: AC
  Administered 2011-07-03: 5 mL via ORAL
  Filled 2011-07-03: qty 5

## 2011-07-03 NOTE — ED Notes (Signed)
Pt states that she has been having HA, fever (with no control on tylenol or advil) pt states that she has been having a pain in her chest when she breaths and that she is having difficulty breathing. Pt states that she has a history of CHF. Pt denies any trauma to chest area. Pt stating at 100% RA. Pt alert and oriented and able to follow commands and move extremities.

## 2011-07-03 NOTE — ED Notes (Signed)
Pt has been taking amoxicillin, but states that she feels like her "lungs are going to collapse"

## 2011-07-03 NOTE — ED Provider Notes (Signed)
History     CSN: [id number]  Arrival date & time 07/03/11  2115   First MD Initiated Contact with Patient 07/03/11 2142      Chief Complaint  Patient presents with  . Cough    with SOB     HPI  History provided by the patient. Patient is a 46 year old female with past history of hypertension, CHF who presents with complaints of increasing cough and shortness of breath for the past 2 days. Patient states that symptoms began 4-5 days ago with generalized fatigue, nasal congestion or sinus pressure. Patient was seen for these symptoms 2 days ago prescribed amoxicillin for sinus infection. Patient reports taking 2 doses of amoxicillin since that time but also developing postnasal drip and cough. Cough is productive of green-yellow sputum. She has some discomfort in chest with coughing fits. Denies hemoptysis. Symptoms are described as moderate to severe. Patient has also use Robitussin for cough without significant improvement. Patient denies any increased swelling in lower extremities. She denies any orthopnea or paroxysmal nocturnal dyspnea. Patient denies any other aggravating or alleviating factors.   Past Medical History  Diagnosis Date  . CHF (congestive heart failure)   . Stroke   . Hypertension     Past Surgical History  Procedure Date  . Caesarian     No family history on file.  History  Substance Use Topics  . Smoking status: Current Everyday Smoker  . Smokeless tobacco: Not on file  . Alcohol Use: No    OB History    Grav Para Term Preterm Abortions TAB SAB Ect Mult Living                  Review of Systems  Constitutional: Positive for fever, chills, diaphoresis and fatigue. Negative for appetite change.  HENT: Positive for congestion and sinus pressure. Negative for sore throat and rhinorrhea.   Respiratory: Positive for cough and shortness of breath.   Cardiovascular: Positive for chest pain. Negative for palpitations.  Gastrointestinal: Negative for  nausea, vomiting, abdominal pain, diarrhea and constipation.  All other systems reviewed and are negative.      Allergies  Ace inhibitors  Home Medications   Current Outpatient Rx  Name Route Sig Dispense Refill  . AMOXICILLIN 500 MG PO CAPS Oral Take 1,000 mg by mouth 2 (two) times daily.    . ASPIRIN BUFFERED 325 MG PO TABS Oral Take 325 mg by mouth daily.    Marland Kitchen CARVEDILOL 6.25 MG PO TABS Oral Take 6.25 mg by mouth 2 (two) times daily with a meal.    . OMEGA-3 FATTY ACIDS 1000 MG PO CAPS Oral Take 1 g by mouth 2 (two) times daily.    . FUROSEMIDE 20 MG PO TABS Oral Take 20 mg by mouth 2 (two) times daily.    . IRON PO TABS Oral Take 1 tablet by mouth 2 (two) times daily.    Marland Kitchen LOSARTAN POTASSIUM 50 MG PO TABS Oral Take 50 mg by mouth daily.    Marland Kitchen POTASSIUM CHLORIDE CRYS ER 20 MEQ PO TBCR Oral Take 20 mEq by mouth daily.    Marland Kitchen PRAVASTATIN SODIUM 20 MG PO TABS Oral Take 20 mg by mouth daily.      BP 161/95  Pulse 94  Temp(Src) 99.6 F (37.6 C) (Oral)  Resp 18  SpO2 98%  LMP 05/05/2011  Physical Exam  Nursing note and vitals reviewed. Constitutional: She is oriented to person, place, and time. She appears well-developed and well-nourished. No  distress.  HENT:  Head: Normocephalic and atraumatic.  Mouth/Throat: Oropharynx is clear and moist.       Slight rhinorrhea and nasal congestion. Nasal mucosa edematous.  Neck: Normal range of motion. Neck supple. No JVD present. No tracheal deviation present.  Cardiovascular: Normal rate and regular rhythm.   Pulmonary/Chest: Effort normal and breath sounds normal. No stridor. No respiratory distress. She has no wheezes. She has no rales.  Abdominal: Soft. Bowel sounds are normal. She exhibits no distension. There is no tenderness. There is no rebound.  Musculoskeletal: She exhibits edema.  Neurological: She is alert and oriented to person, place, and time.  Skin: Skin is warm and dry. No rash noted.  Psychiatric: She has a normal mood  and affect. Her behavior is normal.    ED Course  Procedures     Dg Chest 2 View  07/03/2011  *RADIOLOGY REPORT*  Clinical Data: Cough and congestion.  Fever.  Hypertension.  CHF. Smoker.  CHEST - 2 VIEW  Comparison: 05/18/2010  Findings: The heart size and pulmonary vascularity are normal. The lungs appear clear and expanded without focal air space disease or consolidation. No blunting of the costophrenic angles.  No pneumothorax.  No significant change since previous study.  IMPRESSION: No evidence of active pulmonary disease.  Original Report Authenticated By: Marlon Pel, M.D.     1. Cough   2. Bronchitis       MDM  9:50PM patient seen and evaluated. Patient in no acute distress. Patient with normal respirations and O2 sats. Patient is coughing during exam with productive sputum.    Medical screening examination/treatment/procedure(s) were performed by non-physician practitioner and as supervising physician I was immediately available for consultation/collaboration. Osvaldo Human, M.D.    Angus Seller, PA 07/04/11 0154  Carleene Cooper III, MD 07/04/11 [phone]

## 2011-07-16 ENCOUNTER — Other Ambulatory Visit: Payer: Self-pay | Admitting: Internal Medicine

## 2011-07-16 ENCOUNTER — Telehealth: Payer: Self-pay | Admitting: Internal Medicine

## 2011-07-16 NOTE — Telephone Encounter (Signed)
I left a message for the patient to call. I am not certain of what has happened with her refills. Beta blockers can sometimes cause hair loss.

## 2011-07-16 NOTE — Telephone Encounter (Signed)
Pt has been trying to get her refill for over a week now but we have not sent the auth to [facility] on [address]. Pt needs Carvedilol 6.25mg  BID; Pravastatin 20mg  QD, Losartan 50mg  QD sent in asap. Pt aslo wants to talk about the meds she is on because her hair is falling out and she wants to make sure it's not her meds

## 2011-07-16 NOTE — Telephone Encounter (Signed)
Pt needs refill on Carvedilol 6.25mg ; pravastatin 20mg ; losartan 50mg  sent to [facility] on [address]. Pt has been trying to get refills for over a week now and we still have not gotten the Pharm the approval for meds. Pt also wants to talk about her meds to makes sure that nothing she is taking will make her hair fall out

## 2011-07-16 NOTE — Telephone Encounter (Signed)
No call back from the patient. I have received, for the first time today, a prior authorization form for Dr. Graciela Husbands to sign for Losartan. I will forward to Dr. Graciela Husbands to if he feels hair loss could be related to beta blocker. I will attempt to call the patient back next week when I return to the office unless she calls back before I return. I will leave the PA form for Dr. Graciela Husbands to sign. The patient needs to have her lipids/liver rechecked since I do not see that this has been done since 2011.

## 2011-07-23 ENCOUNTER — Telehealth: Payer: Self-pay | Admitting: Internal Medicine

## 2011-07-23 NOTE — Telephone Encounter (Signed)
Called pharmacy and put in refill request.  Called patient and explained that per Heather's last note, the prior authorization form may have been what was taking her meds so long to be filled and that the beta blocker can sometimes cause hair loss.  Will send to Dr. Graciela Husbands to review and see if there may be an alternative to her carvedilol.  Also patient is due for LIPID/LIVER panel.  Explained this to patient and gave her fasting instructions.  She will come in [place], 3/6 and have these labs done.   Forward to Sherri Rad, RN to review and discuss with Dr. Graciela Husbands.  Vista Mink, CMA

## 2011-07-23 NOTE — Telephone Encounter (Signed)
Pt lorsartin and carvedilol refill needed at [facility]

## 2011-07-23 NOTE — Telephone Encounter (Signed)
Pt needs  °

## 2011-07-28 ENCOUNTER — Other Ambulatory Visit: Payer: Medicaid Other

## 2011-07-28 NOTE — Telephone Encounter (Signed)
Fu call Patient was calling about losartan rx She hasnt heard anything about this med being refilled. Please call her back she wanted to talk to a nurse

## 2011-07-28 NOTE — Telephone Encounter (Signed)
Will forward to Dr. Klein. 

## 2011-07-28 NOTE — Telephone Encounter (Signed)
We can try toprol say 25 mg po daily to replace 6.25

## 2011-08-03 ENCOUNTER — Telehealth: Payer: Self-pay

## 2011-08-03 MED ORDER — LOSARTAN POTASSIUM 50 MG PO TABS: 50.0000 mg | ORAL_TABLET | Freq: Every day | ORAL | Status: DC

## 2011-08-03 NOTE — Telephone Encounter (Signed)
Patient called needing refill for losartan.Losartan refill sent to [facility] on [address] 1 month supply 1 refill.Advised to keep appointment with Dr.Klein 09/15/11.

## 2011-08-05 ENCOUNTER — Encounter: Payer: Self-pay | Admitting: *Deleted

## 2011-08-05 NOTE — Telephone Encounter (Signed)
PA approval received for Losartan. Approved for 1 year- 07/16/11-06/2212. # 90. I called [facility] pharmacy and made them aware.

## 2011-08-11 ENCOUNTER — Encounter (HOSPITAL_COMMUNITY): Payer: Self-pay | Admitting: *Deleted

## 2011-08-11 ENCOUNTER — Emergency Department (INDEPENDENT_AMBULATORY_CARE_PROVIDER_SITE_OTHER)
Admission: EM | Admit: 2011-08-11 | Discharge: 2011-08-11 | Disposition: A | Discharge status: Home / Self Care | Payer: Medicaid Other | Source: Home / Self Care

## 2011-08-11 DIAGNOSIS — B373 Candidiasis of vulva and vagina: Secondary | ICD-10-CM

## 2011-08-11 LAB — WET PREP, GENITAL: Clue Cells Wet Prep HPF POC: NONE SEEN

## 2011-08-11 MED ORDER — FLUCONAZOLE 150 MG PO TABS: 150.0000 mg | ORAL_TABLET | Freq: Once | ORAL | Status: AC

## 2011-08-11 NOTE — ED Provider Notes (Signed)
History     CSN: [id number]  Arrival date & time 08/11/11  1841   None     Chief Complaint  Patient presents with  . Vaginal Discharge    (Consider location/radiation/quality/duration/timing/severity/associated sxs/prior treatment) HPI Comments: Patient presents today with complaints of an itchy vaginal discharge for the last 3 days. Symptoms of the evening she has achy discomfort across her lower abdomen also. She denies dysuria though with urinating she notices increased external genital itching and irritation. No new sexual partners. She states she has been with her current partner for over 30 years.   Past Medical History  Diagnosis Date  . CHF (congestive heart failure)   . Stroke   . Hypertension     Past Surgical History  Procedure Date  . Caesarian     History reviewed. No pertinent family history.  History  Substance Use Topics  . Smoking status: Current Everyday Smoker  . Smokeless tobacco: Not on file  . Alcohol Use: No    OB History    Grav Para Term Preterm Abortions TAB SAB Ect Mult Living                  Review of Systems  Constitutional: Negative for fever and chills.  Gastrointestinal: Negative for vomiting and diarrhea.  Genitourinary: Negative for dysuria and frequency.    Allergies  Ace inhibitors  Home Medications   Current Outpatient Rx  Name Route Sig Dispense Refill  . ASPIRIN BUFFERED 325 MG PO TABS Oral Take 325 mg by mouth daily.    Marland Kitchen CARVEDILOL 6.25 MG PO TABS Oral Take 6.25 mg by mouth 2 (two) times daily with a meal.    . HYDROCOD POLST-CPM POLST ER 10-8 MG/5ML PO LQCR Oral Take 5 mLs by mouth every 12 (twelve) hours. Take 5 mLs by mouth every 12 (twelve) hours. 140 mL 0  . OMEGA-3 FATTY ACIDS 1000 MG PO CAPS Oral Take 1 g by mouth 2 (two) times daily.    Marland Kitchen FLUCONAZOLE 150 MG PO TABS Oral Take 1 tablet (150 mg total) by mouth once. 1 tablet 0  . FUROSEMIDE 20 MG PO TABS Oral Take 20 mg by mouth 2 (two) times daily.    .  IRON PO TABS Oral Take 1 tablet by mouth 2 (two) times daily.    Marland Kitchen LOSARTAN POTASSIUM 50 MG PO TABS Oral Take 1 tablet (50 mg total) by mouth daily. 30 tablet 1  . POTASSIUM CHLORIDE CRYS ER 20 MEQ PO TBCR Oral Take 20 mEq by mouth daily.    Marland Kitchen PRAVASTATIN SODIUM 20 MG PO TABS  TAKE ONE TABLET BY MOUTH EVERY DAY 30 tablet 2    BP 128/80  Pulse 72  Temp(Src) 98.6 F (37 C) (Oral)  SpO2 100%  LMP 07/05/2011  Physical Exam  Nursing note and vitals reviewed. Constitutional: She appears well-developed and well-nourished. No distress.  HENT:  Head: Normocephalic and atraumatic.  Cardiovascular: Regular rhythm and normal heart sounds.   Pulmonary/Chest: Effort normal and breath sounds normal. No respiratory distress.  Abdominal: Soft. Bowel sounds are normal. She exhibits no distension and no mass. There is no tenderness.  Genitourinary: Uterus normal. There is no rash, tenderness, lesion or injury on the right labia. There is no rash, tenderness, lesion or injury on the left labia. Cervix exhibits no motion tenderness, no discharge and no friability. Right adnexum displays no mass, no tenderness and no fullness. Left adnexum displays no mass, no tenderness and no fullness. No  erythema, tenderness or bleeding around the vagina. No foreign body around the vagina. No signs of injury (thick, curdled) around the vagina. Vaginal discharge found.  Neurological: She is alert.  Skin: Skin is warm and dry.  Psychiatric: She has a normal mood and affect.    ED Course  Procedures (including critical care time)  Labs Reviewed - No data to display No results found.   1. Yeast vaginitis       MDM          Melody Comas, PA 08/11/11 2002

## 2011-08-11 NOTE — Discharge Instructions (Signed)
You'll be notified if your vaginal cultures are abnormal. Take the Diflucan pill orally. You should began to notice improvement of her symptoms within 24 hours. As we discussed douching is not recommended. Return or see Dr. Andrey Campanile if your symptoms change or worsen   Candidal Vulvovaginitis Candidal vulvovaginitis is an infection of the vagina and vulva. The vulva is the skin around the opening of the vagina. This may cause itching and discomfort in and around the vagina.  HOME CARE  Only take medicine as told by your doctor.   Do not have sex (intercourse) until the infection is healed or as told by your doctor.   Practice safe sex.   Tell your sex partner about your infection.   Do not douche or use tampons.   Wear cotton underwear. Do not wear tight pants or panty hose.   Eat yogurt. This may help treat and prevent yeast infections.  GET HELP RIGHT AWAY IF:   You have a fever.   Your problems get worse during treatment or do not get better in 3 days.   You have discomfort, irritation, or itching in your vagina or vulva area.   You have pain after sex.   You start to get belly (abdominal) pain.  MAKE SURE YOU:  Understand these instructions.   Will watch your condition.   Will get help right away if you are not doing well or get worse.  Document Released: 08/06/2008 Document Revised: 04/29/2011 Document Reviewed: 08/06/2008 [facility] Patient Information 2012 [place], [place].

## 2011-08-11 NOTE — ED Notes (Signed)
Pt  Reports  Symptoms of a  Vaginal  Discharge    With  Low  abd  Pain   X  3  Days    She  Appears  In no acute  Distress  She  Walks  urpright  With a  Steady  Fluid  Gait            Skin is  Warm and  Dry      Awake  As  Well as  Alert and  Oriented

## 2011-08-11 NOTE — ED Provider Notes (Signed)
Medical screening examination/treatment/procedure(s) were performed by non-physician practitioner and as supervising physician I was immediately available for consultation/collaboration.   MORENO-COLL,Chris Cripps; MD   Hollis Oh Moreno-Coll, MD 08/11/11 2153 

## 2011-08-12 NOTE — ED Notes (Signed)
GC/Chlamydia neg., Wet prep: mod. yeast, mod. WBC's. Pt. adequately treated with Diflucan. [name] 08/12/2011

## 2011-09-14 ENCOUNTER — Encounter: Payer: Self-pay | Admitting: *Deleted

## 2011-09-15 ENCOUNTER — Emergency Department (INDEPENDENT_AMBULATORY_CARE_PROVIDER_SITE_OTHER)
Admission: EM | Admit: 2011-09-15 | Discharge: 2011-09-15 | Disposition: A | Discharge status: Home / Self Care | Payer: Medicaid Other | Source: Home / Self Care | Attending: Emergency Medicine | Admitting: Emergency Medicine

## 2011-09-15 ENCOUNTER — Encounter (HOSPITAL_COMMUNITY): Payer: Self-pay | Admitting: *Deleted

## 2011-09-15 ENCOUNTER — Encounter: Payer: Medicaid Other | Admitting: Internal Medicine

## 2011-09-15 ENCOUNTER — Ambulatory Visit (INDEPENDENT_AMBULATORY_CARE_PROVIDER_SITE_OTHER): Payer: Medicaid Other | Admitting: Internal Medicine

## 2011-09-15 ENCOUNTER — Encounter: Payer: Self-pay | Admitting: Internal Medicine

## 2011-09-15 VITALS — BP 162/103 | HR 79 | Ht 66.0 in

## 2011-09-15 DIAGNOSIS — N898 Other specified noninflammatory disorders of vagina: Secondary | ICD-10-CM

## 2011-09-15 DIAGNOSIS — I5022 Chronic systolic (congestive) heart failure: Secondary | ICD-10-CM

## 2011-09-15 DIAGNOSIS — L658 Other specified nonscarring hair loss: Secondary | ICD-10-CM | POA: Insufficient documentation

## 2011-09-15 DIAGNOSIS — I10 Essential (primary) hypertension: Secondary | ICD-10-CM

## 2011-09-15 DIAGNOSIS — L293 Anogenital pruritus, unspecified: Secondary | ICD-10-CM

## 2011-09-15 DIAGNOSIS — I428 Other cardiomyopathies: Secondary | ICD-10-CM | POA: Insufficient documentation

## 2011-09-15 DIAGNOSIS — N946 Dysmenorrhea, unspecified: Secondary | ICD-10-CM

## 2011-09-15 LAB — WET PREP, GENITAL: Trich, Wet Prep: NONE SEEN

## 2011-09-15 LAB — TSH: TSH: 0.59 u[IU]/mL (ref 0.35–5.50)

## 2011-09-15 LAB — BASIC METABOLIC PANEL
Chloride: 104 mEq/L (ref 96–112)
Creatinine, Ser: 0.7 mg/dL (ref 0.4–1.2)
GFR: 121.55 mL/min (ref >60.00)

## 2011-09-15 MED ORDER — LOSARTAN POTASSIUM 100 MG PO TABS: 100.0000 mg | ORAL_TABLET | Freq: Every day | ORAL | Status: DC

## 2011-09-15 MED ORDER — HYDROCODONE-ACETAMINOPHEN 5-500 MG PO TABS: 1.0000 | ORAL_TABLET | Freq: Four times a day (QID) | ORAL | Status: AC | PRN

## 2011-09-15 MED ORDER — FLUCONAZOLE 150 MG PO TABS: 150.0000 mg | ORAL_TABLET | Freq: Once | ORAL | Status: AC

## 2011-09-15 NOTE — Progress Notes (Signed)
  HPI  Chelsea Branch is a 46 y.o. 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 She had recovery of LV function most recently in 2008. 2011 left ventricular function had decreased to 25%.  She has significant hypertension. She's a history of hypokalemia. She was struggling with affording her medications but she now has Medicaid. She is currently on ARB therapy and beta blocker  her last electrolytes but I can find are about a year ago they were normal.  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 She had no peripheral edema and no shortness of breath. She does have some swelling in her knees after long days work  She notes alopecia over the last year and she thinks it post dates resumption of betablockers  She ahs hixtory f hypokalemia  Past Medical History  Diagnosis Date  . CHF (congestive heart failure)   . Stroke   . Hypertension   . MITRAL REGURGITATION 10/15/2008    Qualifier: Diagnosis of  By: Denyse Amass CMA, Okey Regal    . DYSLIPIDEMIA 06/08/2010    Qualifier: Diagnosis of  By: Huntley Dec, Scott    . 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 Caesarian     Current Outpatient Prescriptions  Medication Sig Dispense Refill  . aspirin 325 MG buffered tablet Take 325 mg by mouth daily.      . carvedilol (COREG) 6.25 MG tablet Take 6.25 mg by mouth 2 (two) times daily with a meal.      . fish oil-omega-3 fatty acids 1000 MG capsule Take 1 g by mouth daily.       . furosemide (LASIX) 20 MG tablet Take 20 mg by mouth 2 (two) times daily.      . Iron TABS Take 1 tablet by mouth 2 (two) times daily.      Marland Kitchen losartan (COZAAR) 50 MG tablet Take 1 tablet (50 mg total) by mouth daily.  30 tablet  1  . potassium chloride SA (K-DUR,KLOR-CON) 20 MEQ tablet Take 20 mEq by mouth daily.      . pravastatin  (PRAVACHOL) 20 MG tablet TAKE ONE TABLET BY MOUTH EVERY DAY  30 tablet  2    Allergies  Allergen Reactions  . Ace Inhibitors     REACTION: Cough    Review of Systems negative except from HPI and PMH  Physical Exam BP 162/103  Pulse 79  Ht 5\' 6"  (1.676 m) Well developed and well nourished in no acute distress HENT normal E scleral and icterus clear Neck Supple JVP flat; carotids brisk and full Clear to ausculation Regular rate and rhythm, no murmurs gallops or rub Soft with active bowel sounds No clubbing cyanosis none Edema Alert and oriented, grossly normal motor and sensory function Skin Warm and Dry  Sinus rhythm at 72 Intervals 0.17/0.09/41 STsegment depression rather diffusely with T wave  Assessment and  Plan

## 2011-09-15 NOTE — Assessment & Plan Note (Signed)
It has been a while since his has been reassessed. Recheck it today

## 2011-09-15 NOTE — Patient Instructions (Signed)
Your physician has recommended you make the following change in your medication:  1) Hold coreg (carvedilol) until you see Dr. Graciela Husbands back- monitor your hair loss. 2) Decrease aspirin to 81 mg once daily. 3) Increase cozaar (losartan) to 100 mg once daily- (you may take two of the 50 mg tablets once daily until your current supply is gone).  Your physician recommends that you have lab work today: bmp/tsh  Your physician recommends that you schedule a follow-up appointment in: 4 weeks with Dr. Graciela Husbands.

## 2011-09-15 NOTE — Assessment & Plan Note (Signed)
Given the alopecia, we will plan to hold the carvedilol for about 4 weeks and see what happens. We will review whether this is a beta blocker class effect or not. We will check a TSH as well as her electrolytes that she has a history of hypokalemia. Given that, we will not add HCTZ to her ARB therapy for blood pressure control but rather just double it. She may need further antihypertensive therapy

## 2011-09-15 NOTE — ED Notes (Signed)
Pt  Reports  Vaginal  Discharge  With burning    in The  Area     X  6  Days   Also  Reports  Low abd  Pain  But  Appears  In no  Distress       She  Was  At her  Cardiologist  This  Am  But  Did  Not tell  Him of the  Symptoms

## 2011-09-15 NOTE — Assessment & Plan Note (Addendum)
As above; we will also check her TSH

## 2011-09-15 NOTE — Assessment & Plan Note (Signed)
She is euvolemic. We will make medication adjustments related to her alopecia

## 2011-09-15 NOTE — ED Provider Notes (Signed)
History     CSN: [id number]  Arrival date & time 09/15/11  1101   First MD Initiated Contact with Patient 09/15/11 1249      Chief Complaint  Patient presents with  . Vaginal Discharge    (Consider location/radiation/quality/duration/timing/severity/associated sxs/prior treatment) HPI Comments: About 5 days ago with vaginal discharge and a lot of itchiness, then with lower pelvic pain and pressure for the last 3 days. No fevers, No vomiting. Started with my period just now"  Patient is a 46 y.o. female presenting with vaginal discharge. The history is provided by the patient.  Vaginal Discharge This is a new problem. The current episode started more than 2 days ago. The problem occurs constantly. The problem has not changed since onset.Associated symptoms include abdominal pain. She has tried nothing for the symptoms.    Past Medical History  Diagnosis Date  . Chronic systolic heart failure   . Stroke   . Hypertension   . MITRAL REGURGITATION 10/15/2008    Qualifier: Diagnosis of  By: Denyse Amass CMA, Okey Regal    . DYSLIPIDEMIA 06/08/2010    Qualifier: Diagnosis of  By: Huntley Dec, Scott    . 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 Alopecia   . Nonischemic cardiomyopathy     iniatially presumed 2/2 peripartum with improvement and then worsening    Past Surgical History  Procedure Date  . Caesarian     History reviewed. No pertinent family history.  History  Substance Use Topics  . Smoking status: Current Everyday Smoker  . Smokeless tobacco: Not on file  . Alcohol Use: No    OB History    Grav Para Term Preterm Abortions TAB SAB Ect Mult Living                  Review of Systems  Constitutional: Negative for fever, appetite change and fatigue.  Gastrointestinal: Positive for abdominal pain.  Genitourinary: Positive for vaginal bleeding and vaginal discharge. Negative for frequency.    Allergies  Ace  inhibitors  Home Medications   Current Outpatient Rx  Name Route Sig Dispense Refill  . CARVEDILOL 12.5 MG PO TABS Oral Take 12.5 mg by mouth 2 (two) times daily with a meal.    . ASPIRIN EC 81 MG PO TBEC Oral Take 1 tablet (81 mg total) by mouth daily.    . OMEGA-3 FATTY ACIDS 1000 MG PO CAPS Oral Take 1 g by mouth daily.     Marland Kitchen FLUCONAZOLE 150 MG PO TABS Oral Take 1 tablet (150 mg total) by mouth once. Repeat treatment in 7 days 2 tablet 2  . FUROSEMIDE 20 MG PO TABS Oral Take 20 mg by mouth 2 (two) times daily.    Marland Kitchen HYDROCODONE-ACETAMINOPHEN 5-500 MG PO TABS Oral Take 1-2 tablets by mouth every 6 (six) hours as needed for pain. 15 tablet 0  . IRON PO TABS Oral Take 1 tablet by mouth 2 (two) times daily.    Marland Kitchen LOSARTAN POTASSIUM 100 MG PO TABS Oral Take 1 tablet (100 mg total) by mouth daily. 30 tablet 6  . POTASSIUM CHLORIDE CRYS ER 20 MEQ PO TBCR Oral Take 20 mEq by mouth daily.    Marland Kitchen PRAVASTATIN SODIUM 20 MG PO TABS  TAKE ONE TABLET BY MOUTH EVERY DAY 30 tablet 2    BP 152/96  Pulse 75  Temp(Src) 98.9 F (37.2 C) (Oral)  Resp 18  SpO2 98%  LMP  09/15/2011  Physical Exam  Nursing note and vitals reviewed. Constitutional: No distress.  HENT:  Head: Normocephalic.  Eyes: Conjunctivae are normal.  Genitourinary: There is no rash or tenderness on the right labia. There is no rash or tenderness on the left labia. Cervix exhibits discharge. Cervix exhibits no motion tenderness.      ED Course  Procedures (including critical care time)  Labs Reviewed  WET PREP, GENITAL - Abnormal; Notable for the following:    Yeast Wet Prep HPF POC MANY (*)    Clue Cells Wet Prep HPF POC MANY (*)    WBC, Wet Prep HPF POC RARE (*)    All other components within normal limits  GC/CHLAMYDIA PROBE AMP, GENITAL   No results found.   1. Dysmenorrhea   2. Vaginal pruritus       MDM  Premenstrual pelvic pain- with new onset menses today. Vaginal pruritus pre-menses        Jimmie Molly, MD 09/15/11 1751

## 2011-09-16 ENCOUNTER — Telehealth (HOSPITAL_COMMUNITY): Payer: Self-pay | Admitting: *Deleted

## 2011-09-16 LAB — GC/CHLAMYDIA PROBE AMP, GENITAL: GC Probe Amp, Genital: NEGATIVE

## 2011-09-16 NOTE — ED Notes (Addendum)
Wet prep positive for many yeast and many clue cells.  Rx received at visit for Diflucan.  Instructed by Dr. Ladon Applebaum to call in Rx for Flagyl 500 mg 1 tab po BID X 10 days if pt is having any symptoms.  Pt called and denies symptoms.  States she feels "much better" after taking diflucan.  Instructed to call back with further problems.

## 2011-09-21 ENCOUNTER — Other Ambulatory Visit: Payer: Self-pay | Admitting: *Deleted

## 2011-09-21 DIAGNOSIS — E876 Hypokalemia: Secondary | ICD-10-CM

## 2011-09-27 ENCOUNTER — Telehealth: Payer: Self-pay | Admitting: Internal Medicine

## 2011-09-27 DIAGNOSIS — I5022 Chronic systolic (congestive) heart failure: Secondary | ICD-10-CM

## 2011-09-27 DIAGNOSIS — I10 Essential (primary) hypertension: Secondary | ICD-10-CM

## 2011-09-27 MED ORDER — LOSARTAN POTASSIUM 100 MG PO TABS: 100.0000 mg | ORAL_TABLET | Freq: Every day | ORAL | Status: DC

## 2011-09-27 NOTE — Telephone Encounter (Signed)
New Problem:     Patient called needing a refill of her losartan (COZAAR) 100 MG tablet.

## 2011-09-28 ENCOUNTER — Telehealth: Payer: Self-pay | Admitting: Internal Medicine

## 2011-09-28 NOTE — Telephone Encounter (Signed)
New Problem:     Patient called in needing a refill of her losartan (COZAAR) 100 MG tablet. Please call back.

## 2011-09-29 ENCOUNTER — Other Ambulatory Visit: Payer: Self-pay

## 2011-09-29 DIAGNOSIS — I5022 Chronic systolic (congestive) heart failure: Secondary | ICD-10-CM

## 2011-09-29 DIAGNOSIS — I10 Essential (primary) hypertension: Secondary | ICD-10-CM

## 2011-09-29 MED ORDER — LOSARTAN POTASSIUM 100 MG PO TABS: 100.0000 mg | ORAL_TABLET | Freq: Every day | ORAL | Status: DC

## 2011-09-29 NOTE — Telephone Encounter (Signed)
Follow-up:     Patient called again to say that Medicaid needs to have Dr. Graciela Husbands call and give a reason why she needs to be on this Medication.  Please call back.

## 2011-10-01 ENCOUNTER — Telehealth: Payer: Self-pay | Admitting: *Deleted

## 2011-10-01 NOTE — Telephone Encounter (Signed)
Kem Parkinson, [place] 10/01/2011 2:58 PM Signed  Pharmacy called stating this pattient needs Prior Auth the number is [id number] [facility] . Will route to the appropriate person to handle this matter.. Patient called for someone to take care of this matter. Pt has been out of medication for 1 week which is Losartan.....Marland Kitchen  I called and spoke with the patient's insurance. PA was received for a year. I notified Nicolette Bang on [place] of this. They have run this through while I was on the phone and had no problems with this. They will have it ready for her later this evening. I have left a message at the patient's contact number this will be ready tonight.

## 2011-10-01 NOTE — Telephone Encounter (Signed)
Fu call Pt is calling back about losartan. Please call back

## 2011-10-01 NOTE — Telephone Encounter (Signed)
Pharmacy called stating this pattient needs Prior Auth the number is [phone] [id number] [facility] . Will route to the appropriate person to handle this matter.. Patient called for someone to take care of this matter. Pt has been out of medication for 1 week which is Losartan....Marland KitchenMarland Kitchen

## 2011-10-01 NOTE — Telephone Encounter (Signed)
Message copied to previous phone note. Will close this encounter.

## 2011-10-06 ENCOUNTER — Other Ambulatory Visit: Payer: Medicaid Other

## 2011-10-13 ENCOUNTER — Other Ambulatory Visit: Payer: Medicaid Other

## 2011-10-14 ENCOUNTER — Ambulatory Visit: Payer: Medicaid Other | Admitting: Internal Medicine

## 2011-10-14 ENCOUNTER — Encounter: Payer: Medicaid Other | Admitting: Internal Medicine

## 2011-10-28 ENCOUNTER — Ambulatory Visit: Payer: Medicaid Other | Admitting: Cardiology

## 2011-10-31 ENCOUNTER — Other Ambulatory Visit: Payer: Self-pay | Admitting: Internal Medicine

## 2011-11-01 ENCOUNTER — Encounter: Payer: Self-pay | Admitting: Cardiology

## 2011-11-01 ENCOUNTER — Ambulatory Visit (INDEPENDENT_AMBULATORY_CARE_PROVIDER_SITE_OTHER): Payer: Medicaid Other | Admitting: Cardiology

## 2011-11-01 VITALS — BP 128/96 | HR 96 | Ht 68.0 in | Wt 212.0 lb

## 2011-11-01 DIAGNOSIS — I428 Other cardiomyopathies: Secondary | ICD-10-CM

## 2011-11-01 DIAGNOSIS — I5022 Chronic systolic (congestive) heart failure: Secondary | ICD-10-CM

## 2011-11-01 DIAGNOSIS — I509 Heart failure, unspecified: Secondary | ICD-10-CM

## 2011-11-01 MED ORDER — PRAVASTATIN SODIUM 20 MG PO TABS: 20.0000 mg | ORAL_TABLET | Freq: Every day | ORAL | Status: DC

## 2011-11-01 NOTE — Progress Notes (Signed)
ELECTROPHYSIOLOGY OFFICE NOTE  [name] MRN: [id number], DOB/AGE: October 27, 1965   Date of Visit: 11/01/2011  Primary Physician: Georganna Skeans, MD Primary Cardiologist: Berton Mount, MD Reason for Visit: Follow-up after medication adjustments  History of Present Illness Ms. Vaeth is a pleasant 46 year old woman with a NICM, chronic systolic CHF and HTN who presents today for 4 week follow-up after her medications were adjusted by Dr. Graciela Husbands. She was experiencing significant hair loss and her carvedilol was discontinued. Losartan was increased for BP management. Today, she reports minimal improvement in her hair loss. She has had significant pruritus in the scalp. She is using OTC hydrocortisone ointment and Rogaine. From a cardiac standpoint, she has no complaints. She denies chest pain or shortness of breath. She denies palpitations, dizziness, near syncope or syncope. She denies pedal edema. She has intermittent abdominal swelling but states furosemide helps. She denies orthopnea or PND. She has been noncompliant recently with her diet, eating out at [employer] and [employer]. She reports compliance with her medications.   Past Medical History  Diagnosis Date  . Chronic systolic heart failure   . Stroke   . Hypertension   . MITRAL REGURGITATION 10/15/2008    Qualifier: Diagnosis of  By: Denyse Amass CMA, Okey Regal    . DYSLIPIDEMIA 06/08/2010    Qualifier: Diagnosis of  By: Huntley Dec, Scott    . 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 Alopecia   . Nonischemic cardiomyopathy     iniatially presumed 2/2 peripartum with improvement and then worsening    Past Surgical History  Procedure Date  . Caesarian    Allergies/Intolerances Allergies  Allergen Reactions  . Ace Inhibitors     REACTION: Cough   Current Home Medications Current Outpatient Prescriptions  Medication Sig Dispense Refill  . aspirin EC 81 MG tablet  1/2 TAB PO QD      . fish oil-omega-3 fatty acids 1000 MG capsule Take 1 g by mouth daily.       . furosemide (LASIX) 20 MG tablet Take 20 mg by mouth 2 (two) times daily.      . Iron TABS Take 1 tablet by mouth 2 (two) times daily.      Marland Kitchen losartan (COZAAR) 100 MG tablet Take 1 tablet (100 mg total) by mouth daily.  30 tablet  6  . potassium chloride SA (K-DUR,KLOR-CON) 20 MEQ tablet Take 20 mEq by mouth daily.      . pravastatin (PRAVACHOL) 20 MG tablet Take 1 tablet (20 mg total) by mouth daily.  90 tablet  3   Social History Social History  . Marital Status: Single    Spouse Name: N/A    Number of Children: N/A  . Years of Education: N/A   Social History Main Topics  . Smoking status: Current Everyday Smoker  . Smokeless tobacco: Not on file  . Alcohol Use: No  . Drug Use: No  . Sexually Active: Yes   Review of Systems General:  No chills, fever, night sweats or weight changes Cardiovascular:  No chest pain, dyspnea on exertion, edema, orthopnea, palpitations, paroxysmal nocturnal dyspnea Dermatological: No rash, lesions or masses Respiratory: No cough, dyspnea Urologic: No hematuria, dysuria Abdominal:   No nausea, vomiting, diarrhea, bright red blood per rectum, melena, or hematemesis Neurologic:  No visual changes, weakness, changes in mental status All other systems reviewed and are otherwise negative except as noted above.  Physical Exam Blood pressure 128/96, pulse 96, height 5\' 8"  (1.727 m), weight 212 lb (96.163 kg).  General: Well developed, well appearing 46 year old female in no acute distress. HEENT: Normocephalic, atraumatic. EOMs intact. Sclera nonicteric. Oropharynx clear.  Neck: Supple without bruits. No JVD. Lungs:  Respirations regular and unlabored, CTA bilaterally. No wheezes, rales or rhonchi. Heart: RRR. S1, S2 present. No murmurs, rub, S3 or S4. Abdomen: Soft, non-distended.   Extremities: No clubbing, cyanosis or edema. DP/PT/Radials 2+ and equal  bilaterally. Psych: Normal affect. Neuro: Alert and oriented X 3. Moves all extremities spontaneously. Scalp: Scarring alopecia noted on vertex scalp; few patches of alopecia at frontal hairline   Assessment and Plan 1. Chronic systolic CHF - stable; patient appears euvolemic by exam today; she denies any worsening CHF symptoms; she will continue medical therapy; counseled regarding the importance of low sodium (<2 grams per day), fluid restricted diet and daily weights 2. 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 Graciela Husbands who was in to see the patient with me. Signed, Rick Duff, PA-C 11/01/2011, 11:40 AM

## 2012-01-27 ENCOUNTER — Emergency Department (HOSPITAL_COMMUNITY)
Admission: EM | Admit: 2012-01-27 | Discharge: 2012-01-27 | Disposition: A | Discharge status: Home / Self Care | Payer: Self-pay | Attending: Emergency Medicine | Admitting: Emergency Medicine

## 2012-01-27 ENCOUNTER — Encounter (HOSPITAL_COMMUNITY): Payer: Self-pay

## 2012-01-27 DIAGNOSIS — R0989 Other specified symptoms and signs involving the circulatory and respiratory systems: Secondary | ICD-10-CM | POA: Insufficient documentation

## 2012-01-27 DIAGNOSIS — M545 Low back pain, unspecified: Secondary | ICD-10-CM | POA: Insufficient documentation

## 2012-01-27 DIAGNOSIS — A5900 Urogenital trichomoniasis, unspecified: Secondary | ICD-10-CM

## 2012-01-27 DIAGNOSIS — J40 Bronchitis, not specified as acute or chronic: Secondary | ICD-10-CM

## 2012-01-27 DIAGNOSIS — J3489 Other specified disorders of nose and nasal sinuses: Secondary | ICD-10-CM | POA: Insufficient documentation

## 2012-01-27 DIAGNOSIS — N949 Unspecified condition associated with female genital organs and menstrual cycle: Secondary | ICD-10-CM | POA: Insufficient documentation

## 2012-01-27 DIAGNOSIS — N898 Other specified noninflammatory disorders of vagina: Secondary | ICD-10-CM | POA: Insufficient documentation

## 2012-01-27 DIAGNOSIS — R05 Cough: Secondary | ICD-10-CM | POA: Insufficient documentation

## 2012-01-27 DIAGNOSIS — R059 Cough, unspecified: Secondary | ICD-10-CM | POA: Insufficient documentation

## 2012-01-27 DIAGNOSIS — Z720 Tobacco use: Secondary | ICD-10-CM

## 2012-01-27 LAB — URINALYSIS, ROUTINE W REFLEX MICROSCOPIC
Bilirubin Urine: NEGATIVE
Ketones, ur: NEGATIVE mg/dL
Nitrite: NEGATIVE
Protein, ur: NEGATIVE mg/dL
Urobilinogen, UA: 1 mg/dL (ref 0.0–1.0)

## 2012-01-27 LAB — WET PREP, GENITAL: Clue Cells Wet Prep HPF POC: NONE SEEN

## 2012-01-27 LAB — URINE MICROSCOPIC-ADD ON

## 2012-01-27 MED ORDER — METRONIDAZOLE 500 MG PO TABS
2000.0000 mg | ORAL_TABLET | Freq: Once | ORAL | Status: AC
  Administered 2012-01-27: 2000 mg via ORAL
  Filled 2012-01-27: qty 4

## 2012-01-27 MED ORDER — LEVOFLOXACIN 500 MG PO TABS: 500.0000 mg | ORAL_TABLET | Freq: Every day | ORAL | Status: AC

## 2012-01-27 NOTE — ED Notes (Signed)
Pt reports chest/nasal congestion, productive cough w/clear colored sputum x3 weeks. Pt also reports vaginal pain, lower back pain, vaginal odor and vaginal d/c x1 week.

## 2012-01-27 NOTE — ED Notes (Addendum)
Pt currently on period, states before her period she was spotting, and having moderate discomfort. Pt states she is also having chest/nasal congestion with productive, yellow phlegm. Pt states that she is worried her boyfriend has STDs and she wishes to be tested.

## 2012-01-27 NOTE — ED Notes (Signed)
Urine taken to Lab.

## 2012-01-28 LAB — GC/CHLAMYDIA PROBE AMP, GENITAL
Chlamydia, DNA Probe: NEGATIVE
GC Probe Amp, Genital: NEGATIVE

## 2012-01-29 ENCOUNTER — Emergency Department (HOSPITAL_COMMUNITY): Admission: EM | Admit: 2012-01-29 | Discharge: 2012-01-29 | Discharge status: Against Medical Advice | Payer: Self-pay

## 2012-01-29 NOTE — ED Notes (Signed)
Pt called x1. Unable to locate. 

## 2012-01-29 NOTE — ED Notes (Signed)
Pt called x3. Unable to locate. 

## 2012-01-29 NOTE — ED Notes (Signed)
Patient came to ED requesting change in Levaquin RX, too expensive. Narvaez PA changed RX to Keflex 500 mg PO, one four times a day x seven days. Written RX given to patient.

## 2012-01-29 NOTE — ED Notes (Signed)
Pt called x2. Unable to locate. 

## 2012-02-10 NOTE — ED Provider Notes (Signed)
History     CSN: [id number]  Arrival date & time 01/27/12  1624   First MD Initiated Contact with Patient 01/27/12 1858      Chief Complaint  Patient presents with  . Nasal Congestion  . Vaginal Discharge    (Consider location/radiation/quality/duration/timing/severity/associated sxs/prior treatment) Patient is a 46 y.o. female presenting with vaginal discharge and cough. The history is provided by the patient.  Vaginal Discharge This is a new problem. The current episode started 1 to 4 weeks ago. The problem has been unchanged. Associated symptoms include coughing and myalgias. Pertinent negatives include no chest pain. Associated symptoms comments: Vaginal discharge the has a bad odor for one week. No bleeding. She denies pelvic pain. No abnormal periods. She reports a concern for STD exposure from boyfriend..  Cough The current episode started more than 1 week ago. The problem has not changed since onset.The cough is non-productive. There has been no fever. Associated symptoms include myalgias. Pertinent negatives include no chest pain and no shortness of breath. Her past medical history is significant for pneumonia.    Past Medical History  Diagnosis Date  . Chronic systolic heart failure   . Stroke   . Hypertension   . MITRAL REGURGITATION 10/15/2008    Qualifier: Diagnosis of  By: Denyse Amass CMA, Okey Regal    . DYSLIPIDEMIA 06/08/2010    Qualifier: Diagnosis of  By: Huntley Dec, Scott    . 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 Alopecia   . Nonischemic cardiomyopathy     iniatially presumed 2/2 peripartum with improvement and then worsening    Past Surgical History  Procedure Date  . Caesarian     History reviewed. No pertinent family history.  History  Substance Use Topics  . Smoking status: Current Every Day Smoker  . Smokeless tobacco: Not on file  . Alcohol Use: No    OB History    Grav Para Term Preterm  Abortions TAB SAB Ect Mult Living                  Review of Systems  Respiratory: Positive for cough. Negative for shortness of breath.   Cardiovascular: Negative for chest pain.  Genitourinary: Positive for vaginal discharge.  Musculoskeletal: Positive for myalgias.    Allergies  Ace inhibitors  Home Medications   Current Outpatient Rx  Name Route Sig Dispense Refill  . ASPIRIN EC 81 MG PO TBEC  1/2 TAB PO QD    . FERROUS SULFATE 325 (65 FE) MG PO TABS Oral Take 325 mg by mouth 2 (two) times daily.    . OMEGA-3 FATTY ACIDS 1000 MG PO CAPS Oral Take 1 g by mouth daily.     . FUROSEMIDE 20 MG PO TABS Oral Take 20 mg by mouth 2 (two) times daily.    Marland Kitchen LOSARTAN POTASSIUM 50 MG PO TABS Oral Take 100 mg by mouth daily.    Marland Kitchen POTASSIUM CHLORIDE CRYS ER 20 MEQ PO TBCR Oral Take 20 mEq by mouth daily.    Marland Kitchen PRAVASTATIN SODIUM 20 MG PO TABS Oral Take 20 mg by mouth daily.      BP 150/95  Pulse 104  Temp 98.8 F (37.1 C) (Oral)  Resp 18  SpO2 99%  LMP 01/24/2012  Physical Exam  Constitutional: She is oriented to person, place, and time. She appears well-developed and well-nourished.  HENT:  Head: Normocephalic.  Neck: Normal range of motion. Neck  supple.  Cardiovascular: Normal rate and regular rhythm.   No murmur heard. Pulmonary/Chest: Effort normal. She has rales.  Abdominal: Soft. Bowel sounds are normal. There is no tenderness. There is no rebound and no guarding.  Genitourinary: Vaginal discharge found.       Minimal vaginal discharge. Cervix nontender and unremarkable in appearance. No adnexal mass or tenderness.  Musculoskeletal: Normal range of motion.  Neurological: She is alert and oriented to person, place, and time.  Skin: Skin is warm and dry. No rash noted.  Psychiatric: She has a normal mood and affect.    ED Course  Procedures (including critical care time)  Labs Reviewed  WET PREP, GENITAL - Abnormal; Notable for the following:    Trich, Wet Prep  MODERATE (*)     WBC, Wet Prep HPF POC FEW (*)     All other components within normal limits  URINALYSIS, ROUTINE W REFLEX MICROSCOPIC - Abnormal; Notable for the following:    Hgb urine dipstick LARGE (*)     Leukocytes, UA MODERATE (*)     All other components within normal limits  URINE MICROSCOPIC-ADD ON - Abnormal; Notable for the following:    Squamous Epithelial / LPF FEW (*)     All other components within normal limits  GC/CHLAMYDIA PROBE AMP, GENITAL  PREGNANCY, URINE  LAB REPORT - SCANNED   No results found.   1. Bronchitis   2. Tobacco abuse   3. Leukorrhea vaginalis - trichomonal       MDM  Patient treated for above diagnoses - uncomplicated course of illness.         Rodena Medin, PA-C 02/10/12 1114

## 2012-02-10 NOTE — ED Provider Notes (Signed)
Medical screening examination/treatment/procedure(s) were performed by non-physician practitioner and as supervising physician I was immediately available for consultation/collaboration.  Rulon Abdalla T Akua Blethen, MD 02/10/12 2325 

## 2012-02-28 ENCOUNTER — Emergency Department (HOSPITAL_COMMUNITY)
Admission: EM | Admit: 2012-02-28 | Discharge: 2012-02-28 | Disposition: A | Discharge status: Home / Self Care | Payer: Self-pay | Attending: Emergency Medicine | Admitting: Emergency Medicine

## 2012-02-28 ENCOUNTER — Encounter (HOSPITAL_COMMUNITY): Payer: Self-pay

## 2012-02-28 DIAGNOSIS — I428 Other cardiomyopathies: Secondary | ICD-10-CM | POA: Insufficient documentation

## 2012-02-28 DIAGNOSIS — F17200 Nicotine dependence, unspecified, uncomplicated: Secondary | ICD-10-CM | POA: Insufficient documentation

## 2012-02-28 DIAGNOSIS — K5900 Constipation, unspecified: Secondary | ICD-10-CM | POA: Insufficient documentation

## 2012-02-28 DIAGNOSIS — E876 Hypokalemia: Secondary | ICD-10-CM | POA: Insufficient documentation

## 2012-02-28 DIAGNOSIS — R296 Repeated falls: Secondary | ICD-10-CM | POA: Insufficient documentation

## 2012-02-28 DIAGNOSIS — R55 Syncope and collapse: Secondary | ICD-10-CM | POA: Insufficient documentation

## 2012-02-28 DIAGNOSIS — Z7982 Long term (current) use of aspirin: Secondary | ICD-10-CM | POA: Insufficient documentation

## 2012-02-28 DIAGNOSIS — Z8673 Personal history of transient ischemic attack (TIA), and cerebral infarction without residual deficits: Secondary | ICD-10-CM | POA: Insufficient documentation

## 2012-02-28 DIAGNOSIS — I10 Essential (primary) hypertension: Secondary | ICD-10-CM | POA: Insufficient documentation

## 2012-02-28 DIAGNOSIS — E785 Hyperlipidemia, unspecified: Secondary | ICD-10-CM | POA: Insufficient documentation

## 2012-02-28 DIAGNOSIS — I5022 Chronic systolic (congestive) heart failure: Secondary | ICD-10-CM | POA: Insufficient documentation

## 2012-02-28 DIAGNOSIS — R109 Unspecified abdominal pain: Secondary | ICD-10-CM | POA: Insufficient documentation

## 2012-02-28 DIAGNOSIS — Z79899 Other long term (current) drug therapy: Secondary | ICD-10-CM | POA: Insufficient documentation

## 2012-02-28 DIAGNOSIS — IMO0002 Reserved for concepts with insufficient information to code with codable children: Secondary | ICD-10-CM | POA: Insufficient documentation

## 2012-02-28 LAB — URINE MICROSCOPIC-ADD ON

## 2012-02-28 LAB — CBC WITH DIFFERENTIAL/PLATELET
Eosinophils Absolute: 0 10*3/uL (ref 0.0–0.7)
Eosinophils Relative: 1 % (ref 0–5)
Hemoglobin: 13.1 g/dL (ref 12.0–15.0)
Lymphocytes Relative: 26 % (ref 12–46)
Lymphs Abs: 1.7 10*3/uL (ref 0.7–4.0)
MCH: 27.9 pg (ref 26.0–34.0)
MCV: 83.4 fL (ref 78.0–100.0)
Monocytes Relative: 4 % (ref 3–12)
Neutrophils Relative %: 70 % (ref 43–77)
RBC: 4.69 MIL/uL (ref 3.87–5.11)
WBC: 6.5 10*3/uL (ref 4.0–10.5)

## 2012-02-28 LAB — URINALYSIS, ROUTINE W REFLEX MICROSCOPIC
Glucose, UA: NEGATIVE mg/dL
Leukocytes, UA: NEGATIVE
Protein, ur: NEGATIVE mg/dL
pH: 7 (ref 5.0–8.0)

## 2012-02-28 LAB — BASIC METABOLIC PANEL
CO2: 28 mEq/L (ref 19–32)
Glucose, Bld: 101 mg/dL — ABNORMAL HIGH (ref 70–99)
Potassium: 2.7 mEq/L — CL (ref 3.5–5.1)
Sodium: 140 mEq/L (ref 135–145)

## 2012-02-28 MED ORDER — POTASSIUM CHLORIDE ER 10 MEQ PO TBCR: 20.0000 meq | EXTENDED_RELEASE_TABLET | Freq: Two times a day (BID) | ORAL | Status: DC

## 2012-02-28 MED ORDER — POTASSIUM CHLORIDE CRYS ER 20 MEQ PO TBCR
40.0000 meq | EXTENDED_RELEASE_TABLET | Freq: Once | ORAL | Status: AC
  Administered 2012-02-28: 40 meq via ORAL
  Filled 2012-02-28: qty 2

## 2012-02-28 MED ORDER — LOSARTAN POTASSIUM 100 MG PO TABS: 100.0000 mg | ORAL_TABLET | Freq: Every day | ORAL | Status: DC

## 2012-02-28 MED ORDER — POTASSIUM CHLORIDE 10 MEQ/100ML IV SOLN
10.0000 meq | Freq: Once | INTRAVENOUS | Status: AC
  Administered 2012-02-28: 10 meq via INTRAVENOUS
  Filled 2012-02-28: qty 100

## 2012-02-28 MED ORDER — SODIUM CHLORIDE 0.9 % IV SOLN
INTRAVENOUS | Status: DC
  Administered 2012-02-28: 09:00:00 via INTRAVENOUS

## 2012-02-28 MED ORDER — FUROSEMIDE 20 MG PO TABS: 20.0000 mg | ORAL_TABLET | Freq: Two times a day (BID) | ORAL | Status: DC

## 2012-02-28 NOTE — ED Notes (Addendum)
Lab reports K 2.7 critical value.  [place] informed

## 2012-02-28 NOTE — ED Notes (Signed)
Pt. Unable to wait for her medications from pharmacy.  She will be back to pick them up later.

## 2012-02-28 NOTE — ED Notes (Addendum)
Pt reports having a pain in left side about 4am today.   Went to restroom and had BM.  Pt reports waking up on the bathroom floor and having marks on the left side of her face but denies pain.  Does not think she hit her head.  Pain 0/10 but doesn't remember how she passed out or how long she was out.  Pt history of high BP. Pt also wants to know if she was cured of STD she was seen for in past. Concerned that she is still spotting. Alert oriented X4.

## 2012-02-28 NOTE — ED Provider Notes (Signed)
11:13 AM Assumed care of patient in the CDU from Dr. Weldon Inches.  Patient presented today with a syncopal episode.  Her BMP showed a Potassium of 2.7.  Both Oral Potassium and IV Potassium have been ordered.  Plan is for the patient to have Potassium and then discharge once the Potassium level has improved.  Reassessed patient.  She denies any dizziness or lightheadedness.  Patient is completely asymptomatic at this time.  VSS.   Patient alert and orientated x 3 Heart:  RRR Lungs:  CTAB  Will recheck Potassium in a couple of hours.  3:30 PM Recheck Potassium is 3.5.  Patient continues to be asymptomatic.  Patient discharged home.  Patient alert and orientated x 3 Heart:  RRR Lungs:  CTAB  Magnus Sinning, PA-C 02/28/12 1659

## 2012-02-28 NOTE — ED Provider Notes (Signed)
History     CSN: [id number]  Arrival date & time 02/28/12  3086   First MD Initiated Contact with Patient 02/28/12 [phone]      Chief Complaint  Patient presents with  . Loss of Consciousness    (Consider location/radiation/quality/duration/timing/severity/associated sxs/prior treatment) Patient is a 45 y.o. female presenting with syncope. The history is provided by the patient.  Loss of Consciousness Associated symptoms include abdominal pain. Pertinent negatives include no chest pain, no headaches and no shortness of breath.   46 year old, female, presents to emergency department for evaluation of a syncopal episode.  She says she got up.  This morning, and had abdominal cramps.  She went to the bathroom, and was trying to have a bowel movement.  Her pain became very severe and she passed out and landed on the floor.  She thinks she was unconscious for 5-10 minutes.  She denies nausea, or vomiting.  She denies a headache, neck pain, weakness, or paresthesias. She denies chest pain, fluttering of her heart or shortness of breath.  Prior to fainting.  She did not have dysuria.  She did not have diarrhea.  She has not had similar symptoms in the past.  She does smoke cigarettes.  She denies history of coronary artery disease, or dysrhythmia in the past.  Presently, she is asymptomatic.  Past Medical History  Diagnosis Date  . Chronic systolic heart failure   . Stroke   . Hypertension   . MITRAL REGURGITATION 10/15/2008    Qualifier: Diagnosis of  By: Denyse Amass CMA, Okey Regal    . DYSLIPIDEMIA 06/08/2010    Qualifier: Diagnosis of  By: Huntley Dec, Scott    . 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 Alopecia   . Nonischemic cardiomyopathy     iniatially presumed 2/2 peripartum with improvement and then worsening    Past Surgical History  Procedure Date  . Caesarian     No family history on file.  History  Substance Use Topics  .  Smoking status: Current Every Day Smoker  . Smokeless tobacco: Not on file  . Alcohol Use: No    OB History    Grav Para Term Preterm Abortions TAB SAB Ect Mult Living                  Review of Systems  Constitutional: Negative for fever.  HENT: Negative for neck pain.   Eyes: Negative for visual disturbance.  Respiratory: Negative for cough and shortness of breath.   Cardiovascular: Positive for syncope. Negative for chest pain, palpitations and leg swelling.  Gastrointestinal: Positive for abdominal pain and constipation. Negative for nausea, vomiting and diarrhea.  Genitourinary: Negative for dysuria.  Musculoskeletal: Negative for back pain.  Skin: Positive for wound.       2 abrasions to the left forehead  Neurological: Positive for syncope. Negative for headaches.  Hematological: Does not bruise/bleed easily.  Psychiatric/Behavioral: Negative for confusion.  All other systems reviewed and are negative.    Allergies  Ace inhibitors  Home Medications   Current Outpatient Rx  Name Route Sig Dispense Refill  . ASPIRIN EC 81 MG PO TBEC  1/2 TAB PO QD    . FERROUS SULFATE 325 (65 FE) MG PO TABS Oral Take 325 mg by mouth 2 (two) times daily.    . OMEGA-3 FATTY ACIDS 1000 MG PO CAPS Oral Take 1 g by mouth daily.     . FUROSEMIDE  20 MG PO TABS Oral Take 20 mg by mouth 2 (two) times daily.    Marland Kitchen LOSARTAN POTASSIUM 50 MG PO TABS Oral Take 100 mg by mouth daily.    Marland Kitchen POTASSIUM CHLORIDE CRYS ER 20 MEQ PO TBCR Oral Take 20 mEq by mouth daily.    Marland Kitchen PRAVASTATIN SODIUM 20 MG PO TABS Oral Take 20 mg by mouth daily.      BP 162/98  Pulse 76  Temp 98.4 F (36.9 C) (Oral)  Resp 19  SpO2 99%  Physical Exam  Nursing note and vitals reviewed. Constitutional: She is oriented to person, place, and time. She appears well-developed and well-nourished. No distress.  HENT:  Head: Normocephalic.       2 small abrasions to the left forehead, without surrounding swelling, or tenderness    Eyes: Conjunctivae normal and EOM are normal.  Neck: Normal range of motion. Neck supple.  Cardiovascular: Normal rate and intact distal pulses.   No murmur heard. Pulmonary/Chest: Effort normal and breath sounds normal. No respiratory distress.  Abdominal: Soft. Bowel sounds are normal. She exhibits no distension. There is no tenderness.  Musculoskeletal: Normal range of motion. She exhibits no edema and no tenderness.  Neurological: She is alert and oriented to person, place, and time. She has normal strength. No cranial nerve deficit. Coordination and gait normal. GCS eye subscore is 4. GCS verbal subscore is 5. GCS motor subscore is 6.  Skin: Skin is warm and dry.       2 small abrasions to the left forehead  Psychiatric: She has a normal mood and affect. Thought content normal.    ED Course  Procedures (including critical care time) Syncopal episode while having a bowel movement, related to constipation.  Physical examination is significant only for small abrasions to the left side of her forehead.  Her neurological examination, and mental status are completely normal.  There is no evidence of a brain injury.  We'll perform laboratory testing, and an EKG.  There is no indication for CAT scan of the head.  At this time. Labs Reviewed  BASIC METABOLIC PANEL - Abnormal; Notable for the following:    Potassium 2.7 (*)     Glucose, Bld 101 (*)     All other components within normal limits  URINALYSIS, ROUTINE W REFLEX MICROSCOPIC - Abnormal; Notable for the following:    Hgb urine dipstick MODERATE (*)     All other components within normal limits  URINE MICROSCOPIC-ADD ON - Abnormal; Notable for the following:    Squamous Epithelial / LPF MANY (*)     All other components within normal limits  CBC WITH DIFFERENTIAL   No results found.   No diagnosis found.  ECG. Normal sinus rhythm at 73 beats per minute. Normal axis. QT interval at the upper limits of normal. Nonspecific  T-wave changes  MDM  Syncope no dysrrhythmia or suggestion of acs. No neuro deficits Hypokalemia - will correct with iv and po K        Cheri Guppy, MD 02/28/12 1140

## 2012-03-03 ENCOUNTER — Other Ambulatory Visit: Payer: Self-pay | Admitting: Internal Medicine

## 2012-03-03 ENCOUNTER — Telehealth: Payer: Self-pay | Admitting: Internal Medicine

## 2012-03-03 DIAGNOSIS — I10 Essential (primary) hypertension: Secondary | ICD-10-CM

## 2012-03-03 DIAGNOSIS — I5022 Chronic systolic (congestive) heart failure: Secondary | ICD-10-CM

## 2012-03-03 MED ORDER — POTASSIUM CHLORIDE CRYS ER 20 MEQ PO TBCR: 20.0000 meq | EXTENDED_RELEASE_TABLET | Freq: Every day | ORAL | Status: DC

## 2012-03-03 MED ORDER — OMEGA-3 FATTY ACIDS 1000 MG PO CAPS: 1.0000 g | ORAL_CAPSULE | Freq: Every day | ORAL | Status: DC

## 2012-03-03 MED ORDER — FERROUS SULFATE 325 (65 FE) MG PO TABS: 325.0000 mg | ORAL_TABLET | Freq: Two times a day (BID) | ORAL | Status: DC

## 2012-03-03 MED ORDER — FUROSEMIDE 20 MG PO TABS: 20.0000 mg | ORAL_TABLET | Freq: Every day | ORAL | Status: DC

## 2012-03-03 MED ORDER — POTASSIUM CHLORIDE ER 10 MEQ PO TBCR: 20.0000 meq | EXTENDED_RELEASE_TABLET | Freq: Two times a day (BID) | ORAL | Status: DC

## 2012-03-03 MED ORDER — LOSARTAN POTASSIUM 100 MG PO TABS: 100.0000 mg | ORAL_TABLET | Freq: Every day | ORAL | Status: DC

## 2012-03-03 MED ORDER — PRAVASTATIN SODIUM 20 MG PO TABS: 20.0000 mg | ORAL_TABLET | Freq: Every day | ORAL | Status: DC

## 2012-03-03 NOTE — Telephone Encounter (Signed)
Follow-up:    Patient returned your call.  Please call back. 

## 2012-03-03 NOTE — Telephone Encounter (Signed)
Fax Received. Refill Completed. Chelsea Branch (R.M.A)   

## 2012-03-03 NOTE — Telephone Encounter (Signed)
New Problem:    Patient returned a call form this number.  Please call back.

## 2012-03-03 NOTE — Telephone Encounter (Signed)
plz return call to pt [phone] to answer question about medications

## 2012-03-11 NOTE — ED Provider Notes (Signed)
Medical screening examination/treatment/procedure(s) were conducted as a shared visit with non-physician practitioner(s) and myself.  I personally evaluated the patient during the encounter  Sahirah Rudell, MD 03/11/12 1527 

## 2012-03-15 ENCOUNTER — Encounter (HOSPITAL_COMMUNITY): Payer: Self-pay | Admitting: *Deleted

## 2012-03-15 ENCOUNTER — Emergency Department (HOSPITAL_COMMUNITY)
Admission: EM | Admit: 2012-03-15 | Discharge: 2012-03-15 | Disposition: A | Discharge status: Home / Self Care | Payer: Self-pay | Attending: Emergency Medicine | Admitting: Emergency Medicine

## 2012-03-15 DIAGNOSIS — L659 Nonscarring hair loss, unspecified: Secondary | ICD-10-CM | POA: Insufficient documentation

## 2012-03-15 DIAGNOSIS — Z7982 Long term (current) use of aspirin: Secondary | ICD-10-CM | POA: Insufficient documentation

## 2012-03-15 DIAGNOSIS — Q054 Unspecified spina bifida with hydrocephalus: Secondary | ICD-10-CM | POA: Insufficient documentation

## 2012-03-15 DIAGNOSIS — Z79899 Other long term (current) drug therapy: Secondary | ICD-10-CM | POA: Insufficient documentation

## 2012-03-15 DIAGNOSIS — Z8679 Personal history of other diseases of the circulatory system: Secondary | ICD-10-CM | POA: Insufficient documentation

## 2012-03-15 DIAGNOSIS — B9689 Other specified bacterial agents as the cause of diseases classified elsewhere: Secondary | ICD-10-CM

## 2012-03-15 DIAGNOSIS — I5022 Chronic systolic (congestive) heart failure: Secondary | ICD-10-CM | POA: Insufficient documentation

## 2012-03-15 DIAGNOSIS — N760 Acute vaginitis: Secondary | ICD-10-CM | POA: Insufficient documentation

## 2012-03-15 DIAGNOSIS — N390 Urinary tract infection, site not specified: Secondary | ICD-10-CM | POA: Insufficient documentation

## 2012-03-15 DIAGNOSIS — F17200 Nicotine dependence, unspecified, uncomplicated: Secondary | ICD-10-CM | POA: Insufficient documentation

## 2012-03-15 DIAGNOSIS — I10 Essential (primary) hypertension: Secondary | ICD-10-CM | POA: Insufficient documentation

## 2012-03-15 DIAGNOSIS — E785 Hyperlipidemia, unspecified: Secondary | ICD-10-CM | POA: Insufficient documentation

## 2012-03-15 LAB — URINALYSIS, ROUTINE W REFLEX MICROSCOPIC
Bilirubin Urine: NEGATIVE
Specific Gravity, Urine: 1.01 (ref 1.005–1.030)
pH: 6 (ref 5.0–8.0)

## 2012-03-15 LAB — URINE MICROSCOPIC-ADD ON

## 2012-03-15 LAB — WET PREP, GENITAL: Yeast Wet Prep HPF POC: NONE SEEN

## 2012-03-15 MED ORDER — METRONIDAZOLE 500 MG PO TABS: 500.0000 mg | ORAL_TABLET | Freq: Two times a day (BID) | ORAL | Status: DC

## 2012-03-15 MED ORDER — CIPROFLOXACIN HCL 500 MG PO TABS: 500.0000 mg | ORAL_TABLET | Freq: Two times a day (BID) | ORAL | Status: DC

## 2012-03-15 NOTE — ED Notes (Signed)
PT to ED that "something isn't right".  She was tx here for trich in Sept.  She states she and her partner were both tx, but she feels as if the symptoms have not resolved.  She continues to feel pelvic and cervical pain and her periods are irregular.

## 2012-03-15 NOTE — ED Provider Notes (Addendum)
History     CSN: [id number]  Arrival date & time 03/15/12  4098   First MD Initiated Contact with Patient 03/15/12 0331      Chief Complaint  Patient presents with  . Pelvic Pain    (Consider location/radiation/quality/duration/timing/severity/associated sxs/prior treatment) Patient is a 46 y.o. female presenting with pelvic pain and abdominal pain. The history is provided by the patient.  Pelvic Pain This is a new problem. Associated symptoms include abdominal pain.  Abdominal Pain The primary symptoms of the illness include abdominal pain. The current episode started more than 2 days ago. The onset of the illness was gradual. The problem has not changed since onset. The patient states that she believes she is currently not pregnant. The patient has not had a change in bowel habit. Associated medical issues comments: Pt states was recently treated for trich and is not sure if  the sxs have resolved.    Past Medical History  Diagnosis Date  . Chronic systolic heart failure   . Stroke   . Hypertension   . MITRAL REGURGITATION 10/15/2008    Qualifier: Diagnosis of  By: Denyse Amass CMA, Okey Regal    . DYSLIPIDEMIA 06/08/2010    Qualifier: Diagnosis of  By: Huntley Dec, Scott    . 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 Alopecia   . Nonischemic cardiomyopathy     iniatially presumed 2/2 peripartum with improvement and then worsening    Past Surgical History  Procedure Date  . Caesarian     No family history on file.  History  Substance Use Topics  . Smoking status: Current Every Day Smoker -- 0.2 packs/day    Types: Cigarettes  . Smokeless tobacco: Not on file  . Alcohol Use: No    OB History    Grav Para Term Preterm Abortions TAB SAB Ect Mult Living                  Review of Systems  Gastrointestinal: Positive for abdominal pain.  Genitourinary: Positive for pelvic pain.    Allergies  Ace inhibitors  Home  Medications   Current Outpatient Rx  Name Route Sig Dispense Refill  . ASPIRIN 325 MG PO TABS Oral Take 162 mg by mouth daily.    Marland Kitchen FERROUS SULFATE 325 (65 FE) MG PO TABS Oral Take 1 tablet (325 mg total) by mouth 2 (two) times daily. 60 tablet 4  . OMEGA-3 FATTY ACIDS 1000 MG PO CAPS Oral Take 1 capsule (1 g total) by mouth daily. 30 capsule 4  . FUROSEMIDE 20 MG PO TABS Oral Take 1 tablet (20 mg total) by mouth daily. 60 tablet 5  . LOSARTAN POTASSIUM 100 MG PO TABS Oral Take 1 tablet (100 mg total) by mouth daily. 30 tablet 4  . POTASSIUM CHLORIDE CRYS ER 20 MEQ PO TBCR Oral Take 1 tablet (20 mEq total) by mouth daily. 30 tablet 5  . PRAVASTATIN SODIUM 20 MG PO TABS Oral Take 1 tablet (20 mg total) by mouth daily. 30 tablet 4    BP 182/99  Pulse 97  Temp 98.6 F (37 C) (Oral)  Resp 16  SpO2 98%  LMP 02/12/2012  Physical Exam  Constitutional: She is oriented to person, place, and time. She appears well-developed and well-nourished.  HENT:  Head: Normocephalic and atraumatic.  Eyes: Conjunctivae normal and EOM are normal. Pupils are equal, round, and reactive to light.  Neck: Normal range  of motion.  Cardiovascular: Normal rate, regular rhythm and normal heart sounds.   Pulmonary/Chest: Effort normal and breath sounds normal.  Abdominal: Soft. Bowel sounds are normal.  Musculoskeletal: Normal range of motion.  Neurological: She is alert and oriented to person, place, and time.  Skin: Skin is warm and dry.  Psychiatric: She has a normal mood and affect. Her behavior is normal.    ED Course  Procedures (including critical care time)  Labs Reviewed  URINALYSIS, ROUTINE W REFLEX MICROSCOPIC - Abnormal; Notable for the following:    APPearance CLOUDY (*)     Hgb urine dipstick LARGE (*)     All other components within normal limits  URINE MICROSCOPIC-ADD ON - Abnormal; Notable for the following:    Squamous Epithelial / LPF MANY (*)     Bacteria, UA MANY (*)     All other  components within normal limits  GC/CHLAMYDIA PROBE AMP, GENITAL  WET PREP, GENITAL   No results found.   No diagnosis found.    MDM  + uti, no trichomoniasis,  Await preg.  If neg, will dc with abx to [facility] outpt health dept, and ret new/worsening sxs         Aadyn Buchheit Lytle Michaels, MD 03/15/12 4098  Griselda Tosh Lytle Michaels, MD 03/15/12 1191  [place]ha Behlke Lytle Michaels, MD 06/07/12 4782

## 2012-03-16 ENCOUNTER — Other Ambulatory Visit: Payer: Self-pay

## 2012-03-16 ENCOUNTER — Ambulatory Visit (INDEPENDENT_AMBULATORY_CARE_PROVIDER_SITE_OTHER): Payer: Self-pay | Admitting: Internal Medicine

## 2012-03-16 ENCOUNTER — Encounter: Payer: Self-pay | Admitting: Internal Medicine

## 2012-03-16 VITALS — BP 179/108 | HR 100 | Ht 68.0 in | Wt 201.0 lb

## 2012-03-16 DIAGNOSIS — I10 Essential (primary) hypertension: Secondary | ICD-10-CM

## 2012-03-16 DIAGNOSIS — E876 Hypokalemia: Secondary | ICD-10-CM

## 2012-03-16 DIAGNOSIS — I428 Other cardiomyopathies: Secondary | ICD-10-CM

## 2012-03-16 LAB — GC/CHLAMYDIA PROBE AMP, GENITAL
Chlamydia, DNA Probe: NEGATIVE
GC Probe Amp, Genital: NEGATIVE

## 2012-03-16 MED ORDER — CARVEDILOL 3.125 MG PO TABS: 3.1250 mg | ORAL_TABLET | Freq: Two times a day (BID) | ORAL | Status: DC

## 2012-03-16 MED ORDER — ISOSORBIDE MONONITRATE ER 30 MG PO TB24: 30.0000 mg | ORAL_TABLET | Freq: Every day | ORAL | Status: DC

## 2012-03-16 MED ORDER — HYDRALAZINE HCL 25 MG PO TABS: 25.0000 mg | ORAL_TABLET | Freq: Two times a day (BID) | ORAL | Status: DC

## 2012-03-16 NOTE — Assessment & Plan Note (Signed)
We will reassess left ventricular function. Her Medicaid gets resumed, we will resume her ARB

## 2012-03-16 NOTE — Assessment & Plan Note (Signed)
The patient's device was interrogated.  The information was reviewed. No changes were made in the programming.    

## 2012-03-16 NOTE — Progress Notes (Signed)
HPI  Chelsea Branch is a 46 y.o. 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 She had recovery of LV function most recently in 2008. 2011 left ventricular function had decreased to 25%.  She has significant hypertension. She's a history of hypokalemia. She was struggling with affording her medications but she now has Medicaid. She is currently on ARB therapy and beta blocker  her last electrolytes but I can find are about a year ago they were normal.  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 She had no peripheral edema and no shortness of breath. She does have some swelling in her knees after long days work  She notes alopecia over the last year and she thinks it post dates resumption of betablockers  She had a syncopal episode about 2 weeks ago. Her potassium was noted to be 2 7. She has lost her Medicaid and so her medications have not been reliably taking.     Past Medical History  Diagnosis Date  . Chronic systolic heart failure   . Stroke   . Hypertension   . MITRAL REGURGITATION 10/15/2008    Qualifier: Diagnosis of  By: Denyse Amass CMA, Okey Regal    . DYSLIPIDEMIA 06/08/2010    Qualifier: Diagnosis of  By: Huntley Dec, Scott    . 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 Alopecia   . Nonischemic cardiomyopathy     iniatially presumed 2/2 peripartum with improvement and then worsening    Past Surgical History  Procedure Date  . Caesarian     Current Outpatient Prescriptions  Medication Sig Dispense Refill  . aspirin 325 MG tablet Take 162 mg by mouth daily.      . ciprofloxacin (CIPRO) 500 MG tablet Take 1 tablet (500 mg total) by mouth 2 (two) times daily.  14 tablet  0  . ferrous sulfate 325 (65 FE) MG tablet Take 1 tablet (325 mg total) by mouth 2 (two) times daily.  60 tablet  4  . fish oil-omega-3 fatty acids 1000 MG capsule  Take 1 capsule (1 g total) by mouth daily.  30 capsule  4  . furosemide (LASIX) 20 MG tablet Take 1 tablet (20 mg total) by mouth daily.  60 tablet  5  . losartan (COZAAR) 100 MG tablet Take 1 tablet (100 mg total) by mouth daily.  30 tablet  4  . metroNIDAZOLE (FLAGYL) 500 MG tablet Take 1 tablet (500 mg total) by mouth 2 (two) times daily.  14 tablet  0  . potassium chloride SA (KLOR-CON M20) 20 MEQ tablet Take 1 tablet (20 mEq total) by mouth daily.  30 tablet  5  . pravastatin (PRAVACHOL) 20 MG tablet Take 1 tablet (20 mg total) by mouth daily.  30 tablet  4    Allergies  Allergen Reactions  . Ace Inhibitors     REACTION: Cough    Review of Systems negative except from HPI and PMH  Physical Exam BP 179/108  Pulse 100  Ht 5\' 8"  (1.727 m)  Wt 201 lb (91.173 kg)  BMI 30.56 kg/m2  LMP 02/12/2012 Well developed and well nourished in no acute distress HENT normal E scleral and icterus clear Neck Supple JVP flat; carotids brisk and full Clear to ausculation Regular rate and rhythm, no murmurs gallops or rub Soft with active bowel sounds No clubbing cyanosis none Edema  Alert and oriented, grossly normal motor and sensory function Skin Warm and Dry      Assessment and  Plan

## 2012-03-16 NOTE — Patient Instructions (Signed)
Your physician recommends that you schedule a follow-up appointment in: 2-3 WEEKS WITH PA/NP  Your physician recommends that you HAVE LAB WORK TODAY  START CARVEDILOL 3.125 MG ONE TABLET TWICE DAILY  START HYDRALAZINE 25 MG ONE TABLET TWICE DAILY  START ISOSORBIDE 30 MG ONCE DAILY

## 2012-03-16 NOTE — Assessment & Plan Note (Signed)
She has poorly controlled hypertension. She stopped taking her medications because of Medicaid discontinuation. She was unable to tolerate ACE inhibitors because of cough and she stopped her beta blockers because of concerns about patient.  We will resume her carvedilol at 3.125 twice daily. We will start her on hydralazine and isosorbide at 25 twice a day/30 we will also begin her on Aldactone which should help with her hypokalemia. We will check renin and other levels given the recurring issue of hypokalemia.

## 2012-03-17 LAB — BASIC METABOLIC PANEL
BUN: 6 mg/dL (ref 6–23)
CO2: 30 mEq/L (ref 19–32)
Chloride: 105 mEq/L (ref 96–112)
Creatinine, Ser: 0.8 mg/dL (ref 0.4–1.2)

## 2012-03-21 ENCOUNTER — Telehealth: Payer: Self-pay | Admitting: Internal Medicine

## 2012-03-21 NOTE — Telephone Encounter (Signed)
Pt's med was changed and now had headaches, thinks med dosage too high, pls advise

## 2012-03-21 NOTE — Telephone Encounter (Signed)
C/o headache since restarting meds at recent OV.  Pt states after takings meds, "I can feel all the blood in my body rushing up into my head."  Pt states that she is not going to take the restarted meds of Coreg 3.125mg  BID, Hydralazine 25mg , and Isosorbide 30mg .  Please advise.

## 2012-03-27 ENCOUNTER — Encounter (HOSPITAL_COMMUNITY): Payer: Self-pay | Admitting: *Deleted

## 2012-03-27 DIAGNOSIS — Z8673 Personal history of transient ischemic attack (TIA), and cerebral infarction without residual deficits: Secondary | ICD-10-CM | POA: Insufficient documentation

## 2012-03-27 DIAGNOSIS — E876 Hypokalemia: Secondary | ICD-10-CM | POA: Insufficient documentation

## 2012-03-27 DIAGNOSIS — Q054 Unspecified spina bifida with hydrocephalus: Secondary | ICD-10-CM | POA: Insufficient documentation

## 2012-03-27 DIAGNOSIS — I080 Rheumatic disorders of both mitral and aortic valves: Secondary | ICD-10-CM | POA: Insufficient documentation

## 2012-03-27 DIAGNOSIS — R197 Diarrhea, unspecified: Secondary | ICD-10-CM | POA: Insufficient documentation

## 2012-03-27 DIAGNOSIS — I428 Other cardiomyopathies: Secondary | ICD-10-CM | POA: Insufficient documentation

## 2012-03-27 DIAGNOSIS — F17200 Nicotine dependence, unspecified, uncomplicated: Secondary | ICD-10-CM | POA: Insufficient documentation

## 2012-03-27 DIAGNOSIS — Z7982 Long term (current) use of aspirin: Secondary | ICD-10-CM | POA: Insufficient documentation

## 2012-03-27 DIAGNOSIS — L659 Nonscarring hair loss, unspecified: Secondary | ICD-10-CM | POA: Insufficient documentation

## 2012-03-27 DIAGNOSIS — E785 Hyperlipidemia, unspecified: Secondary | ICD-10-CM | POA: Insufficient documentation

## 2012-03-27 DIAGNOSIS — R112 Nausea with vomiting, unspecified: Secondary | ICD-10-CM | POA: Insufficient documentation

## 2012-03-27 DIAGNOSIS — Z79899 Other long term (current) drug therapy: Secondary | ICD-10-CM | POA: Insufficient documentation

## 2012-03-27 DIAGNOSIS — R109 Unspecified abdominal pain: Secondary | ICD-10-CM | POA: Insufficient documentation

## 2012-03-27 DIAGNOSIS — I5022 Chronic systolic (congestive) heart failure: Secondary | ICD-10-CM | POA: Insufficient documentation

## 2012-03-27 DIAGNOSIS — I10 Essential (primary) hypertension: Secondary | ICD-10-CM | POA: Insufficient documentation

## 2012-03-27 LAB — CBC WITH DIFFERENTIAL/PLATELET
Basophils Absolute: 0 10*3/uL (ref 0.0–0.1)
Eosinophils Absolute: 0.2 10*3/uL (ref 0.0–0.7)
Lymphocytes Relative: 43 % (ref 12–46)
Lymphs Abs: 2.6 10*3/uL (ref 0.7–4.0)
Neutrophils Relative %: 46 % (ref 43–77)
Platelets: 237 10*3/uL (ref 150–400)
RBC: 4.7 MIL/uL (ref 3.87–5.11)
RDW: 14.1 % (ref 11.5–15.5)
WBC: 6 10*3/uL (ref 4.0–10.5)

## 2012-03-27 LAB — COMPREHENSIVE METABOLIC PANEL
ALT: 12 U/L (ref 0–35)
AST: 18 U/L (ref 0–37)
Alkaline Phosphatase: 89 U/L (ref 39–117)
GFR calc Af Amer: >90 mL/min (ref >90)
Glucose, Bld: 162 mg/dL — ABNORMAL HIGH (ref 70–99)
Potassium: 2.9 mEq/L — ABNORMAL LOW (ref 3.5–5.1)
Sodium: 140 mEq/L (ref 135–145)
Total Protein: 6.9 g/dL (ref 6.0–8.3)

## 2012-03-27 LAB — URINALYSIS, ROUTINE W REFLEX MICROSCOPIC
Bilirubin Urine: NEGATIVE
Glucose, UA: NEGATIVE mg/dL
Hgb urine dipstick: NEGATIVE
Ketones, ur: NEGATIVE mg/dL
Nitrite: NEGATIVE
Specific Gravity, Urine: 1.023 (ref 1.005–1.030)
pH: 6 (ref 5.0–8.0)

## 2012-03-27 NOTE — ED Notes (Signed)
Pt states she ate [employer] chicken last night and this morning she woke up at 4am with cramping in her stomach. Pt states that she didn't eat all day until 3 hours ago and she is still having cramping. Pt denies problems with bowel or urine. Pt denies vomiting.

## 2012-03-28 ENCOUNTER — Emergency Department (HOSPITAL_COMMUNITY)
Admission: EM | Admit: 2012-03-28 | Discharge: 2012-03-28 | Disposition: A | Discharge status: Home / Self Care | Payer: Self-pay | Attending: Emergency Medicine | Admitting: Emergency Medicine

## 2012-03-28 DIAGNOSIS — R109 Unspecified abdominal pain: Secondary | ICD-10-CM

## 2012-03-28 DIAGNOSIS — E876 Hypokalemia: Secondary | ICD-10-CM

## 2012-03-28 MED ORDER — POTASSIUM CHLORIDE CRYS ER 20 MEQ PO TBCR
40.0000 meq | EXTENDED_RELEASE_TABLET | Freq: Once | ORAL | Status: AC
  Administered 2012-03-28: 40 meq via ORAL
  Filled 2012-03-28: qty 2

## 2012-03-28 NOTE — ED Provider Notes (Signed)
History     CSN: [id number]  Arrival date & time 03/27/12  2041   First MD Initiated Contact with Patient 03/28/12 0038      Chief Complaint  Patient presents with  . Abdominal Pain    (Consider location/radiation/quality/duration/timing/severity/associated sxs/prior treatment) HPI Pt states she ate [employer] fried chicken 2 days ago and woke yesterday morning at 0400 with abdominal cramps and nausea. She had a few lose stools and episodes of vomiting. Pt states all symptoms have resolved and that she is at her baseline. No fever, chills, urinary symptoms.  Past Medical History  Diagnosis Date  . Chronic systolic heart failure   . Stroke   . Hypertension   . MITRAL REGURGITATION 10/15/2008    Qualifier: Diagnosis of  By: Denyse Amass CMA, Okey Regal    . DYSLIPIDEMIA 06/08/2010    Qualifier: Diagnosis of  By: Huntley Dec, Scott    . 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 Alopecia   . Nonischemic cardiomyopathy     iniatially presumed 2/2 peripartum with improvement and then worsening    Past Surgical History  Procedure Date  . Caesarian     History reviewed. No pertinent family history.  History  Substance Use Topics  . Smoking status: Current Every Day Smoker -- 0.2 packs/day    Types: Cigarettes  . Smokeless tobacco: Not on file  . Alcohol Use: No    OB History    Grav Para Term Preterm Abortions TAB SAB Ect Mult Living                  Review of Systems  Constitutional: Negative for fever and chills.  Respiratory: Negative for shortness of breath.   Cardiovascular: Negative for chest pain.  Gastrointestinal: Positive for nausea, vomiting, abdominal pain and diarrhea. Negative for constipation.  Genitourinary: Negative for dysuria and flank pain.  Musculoskeletal: Negative for back pain.  Skin: Negative for rash and wound.  Neurological: Negative for dizziness, weakness, light-headedness, numbness and headaches.      Allergies  Ace inhibitors  Home Medications   Current Outpatient Rx  Name  Route  Sig  Dispense  Refill  . ASPIRIN 325 MG PO TABS   Oral   Take 162 mg by mouth daily.         Marland Kitchen CARVEDILOL 3.125 MG PO TABS   Oral   Take 1 tablet (3.125 mg total) by mouth 2 (two) times daily with a meal.   60 tablet   12   . FERROUS SULFATE 325 (65 FE) MG PO TABS   Oral   Take 1 tablet (325 mg total) by mouth 2 (two) times daily.   60 tablet   4   . OMEGA-3 FATTY ACIDS 1000 MG PO CAPS   Oral   Take 1 capsule (1 g total) by mouth daily.   30 capsule   4   . FUROSEMIDE 20 MG PO TABS   Oral   Take 1 tablet (20 mg total) by mouth daily.   60 tablet   5   . HYDRALAZINE HCL 25 MG PO TABS   Oral   Take 1 tablet (25 mg total) by mouth 2 (two) times daily.   60 tablet   11   . POTASSIUM CHLORIDE CRYS ER 20 MEQ PO TBCR   Oral   Take 1 tablet (20 mEq total) by mouth daily.   30 tablet   5   .  PRAVASTATIN SODIUM 20 MG PO TABS   Oral   Take 1 tablet (20 mg total) by mouth daily.   30 tablet   4     BP 165/97  Pulse 74  Temp 97.2 F (36.2 C) (Oral)  Resp 16  SpO2 99%  LMP 02/12/2012  Physical Exam  Nursing note and vitals reviewed. Constitutional: She is oriented to person, place, and time. She appears well-developed and well-nourished. No distress.  HENT:  Head: Normocephalic and atraumatic.  Mouth/Throat: Oropharynx is clear and moist.  Eyes: EOM are normal. Pupils are equal, round, and reactive to light.  Neck: Normal range of motion. Neck supple.  Cardiovascular: Normal rate and regular rhythm.   Pulmonary/Chest: Effort normal and breath sounds normal. No respiratory distress. She has no wheezes. She has no rales.  Abdominal: Soft. Bowel sounds are normal. She exhibits no distension and no mass. There is no tenderness. There is no rebound and no guarding.  Musculoskeletal: Normal range of motion. She exhibits no edema and no tenderness.  Neurological: She is  alert and oriented to person, place, and time.  Skin: Skin is warm and dry. No rash noted. No erythema.  Psychiatric: She has a normal mood and affect. Her behavior is normal.    ED Course  Procedures (including critical care time)  Labs Reviewed  COMPREHENSIVE METABOLIC PANEL - Abnormal; Notable for the following:    Potassium 2.9 (*)     Glucose, Bld 162 (*)     Albumin 3.1 (*)     Total Bilirubin 0.2 (*)     All other components within normal limits  CBC WITH DIFFERENTIAL  URINALYSIS, ROUTINE W REFLEX MICROSCOPIC  POCT PREGNANCY, URINE   No results found.   1. Abdominal cramping   2. Hypokalemia       MDM  Will replace potassium and D/C home. Likely viral GE vs food poisoning. Advise to return for worsening symptoms or any concerns        Loren Racer, MD 03/28/12 0127

## 2012-03-28 NOTE — Telephone Encounter (Signed)
Appt made to see Tereso Newcomer to discuss medications tomorrow, 03/30/2012.  Pt agrees with plan.

## 2012-03-28 NOTE — ED Notes (Signed)
The pt is awake now and is reporting dizzy spells since her bp med was changed 2-3 weeks ago.  Pt alert no pain

## 2012-03-28 NOTE — Telephone Encounter (Signed)
F/u   Patient calling for status on this matter, still has issues with meds.  plz return call to patient at  [phone]

## 2012-03-28 NOTE — ED Notes (Signed)
The pt is sleeping unable to get the pt awake.  She is sleeping soundly

## 2012-03-29 ENCOUNTER — Ambulatory Visit (INDEPENDENT_AMBULATORY_CARE_PROVIDER_SITE_OTHER): Payer: Self-pay | Admitting: Nurse Practitioner

## 2012-03-29 ENCOUNTER — Encounter: Payer: Self-pay | Admitting: Nurse Practitioner

## 2012-03-29 VITALS — BP 180/120 | HR 88 | Ht 68.0 in | Wt 195.0 lb

## 2012-03-29 DIAGNOSIS — I10 Essential (primary) hypertension: Secondary | ICD-10-CM

## 2012-03-29 MED ORDER — FUROSEMIDE 20 MG PO TABS: 20.0000 mg | ORAL_TABLET | Freq: Every day | ORAL | Status: DC

## 2012-03-29 MED ORDER — SPIRONOLACTONE 25 MG PO TABS: 25.0000 mg | ORAL_TABLET | Freq: Every day | ORAL | Status: DC

## 2012-03-29 MED ORDER — ISOSORBIDE MONONITRATE ER 30 MG PO TB24: 15.0000 mg | ORAL_TABLET | Freq: Every day | ORAL | Status: DC

## 2012-03-29 NOTE — Patient Instructions (Addendum)
Restart the Coreg and the Hydralazine as directed.  Take only a half of a tablet of the Isosorbide and take at bedtime - this is what is making your head hurt. It will get better with time  I have started you on Aldactone 25 mg each day. This is at the drug store.  I have refilled your Lasix today. It is at the drug store.  We need to check a BMET on Friday.  I will see you in a week to recheck your blood pressure  Call the [facility] office at [phone] if you have any questions, problems or concerns.

## 2012-03-29 NOTE — Progress Notes (Signed)
Chelsea Branch Date of Birth: 1966/04/13 Medical Record #[id number]  History of Present Illness: Chelsea Branch is seen today for a work in visit. She is seen for Dr. Graciela Branch. She has a nonischemic CM. Presumed to be peripartum and she had recovery of her LV function back in 2008 but dropped back to 25% per echo in 2011. Her other issues include tobacco abuse, HTN, hypokalemia and alopecia.   She was here on 10/24. Reported having a syncopal spell in the setting of a low potassium. Medicines continue to be an issue. Has lost her Medicaid. She has had a cough with ACE inhibitors. Feels like beta blockers cause hairloss. Dr. Graciela Branch resumed her Coreg, started hydralazine and isosorbide and started aldactone at her last visit.   She called last week to report a headache since starting the medicines. She could "feel all the blood in my body rushing to her head". She was not going to take the restarted medicines and a visit was scheduled to go over her medicines. Potassium remains low when checked earlier this week. This was in the setting of diarrhea after eating fried chicken.   She walked in today. Her appointment was for tomorrow. She is here alone. Smells of tobacco. Not taking her Coreg, Hydralazine or Isosorbide. Her head hurts. She does not understand why. Says she is not short of breath. No swelling. Did not get Aldactone started at her last visit. Says she avoids salt but ER documentation mentions her eating at [employer]. Her diarrhea has stopped. She is out of her Lasix. Not sure she is actually taking the potassium. She is a little vague on this issue. Says it costs 21 dollars. No longer on ARB because she cannot afford. No swelling. No chest pain. She is smoking 3 cigarettes per day.   Current Outpatient Prescriptions on File Prior to Visit  Medication Sig Dispense Refill  . aspirin 325 MG tablet Take 162 mg by mouth daily.      . ferrous sulfate 325 (65 FE) MG tablet Take 1 tablet (325 mg total)  by mouth 2 (two) times daily.  60 tablet  4  . fish oil-omega-3 fatty acids 1000 MG capsule Take 1 capsule (1 g total) by mouth daily.  30 capsule  4  . potassium chloride SA (KLOR-CON M20) 20 MEQ tablet Take 1 tablet (20 mEq total) by mouth daily.  30 tablet  5  . pravastatin (PRAVACHOL) 20 MG tablet Take 1 tablet (20 mg total) by mouth daily.  30 tablet  4  . carvedilol (COREG) 3.125 MG tablet Take 1 tablet (3.125 mg total) by mouth 2 (two) times daily with a meal.  60 tablet  12  . hydrALAZINE (APRESOLINE) 25 MG tablet Take 1 tablet (25 mg total) by mouth 2 (two) times daily.  60 tablet  11  . spironolactone (ALDACTONE) 25 MG tablet Take 1 tablet (25 mg total) by mouth daily.  30 tablet  6    Allergies  Allergen Reactions  . Ace Inhibitors     REACTION: Cough    Past Medical History  Diagnosis Date  . Chronic systolic heart failure   . Stroke   . Hypertension   . MITRAL REGURGITATION 10/15/2008    Qualifier: Diagnosis of  By: Denyse Amass CMA, Okey Regal    . DYSLIPIDEMIA 06/08/2010    Qualifier: Diagnosis of  By: Huntley Dec, Scott    . 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  Alopecia   . 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 Noncompliance   . Tobacco abuse     Past Surgical History  Procedure Date  . Caesarian     History  Smoking status  . Current Every Day Smoker -- 0.2 packs/day  . Types: Cigarettes  Smokeless tobacco  . Not on file    History  Alcohol Use No    History reviewed. No pertinent family history.  Review of Systems: The review of systems is per the HPI.  All other systems were reviewed and are negative.  Physical Exam: BP 180/120  Pulse 88  Ht 5\' 8"  (1.727 m)  Wt 195 lb (88.451 kg)  BMI 29.65 kg/m2  LMP 02/12/2012 Patient is in no acute distress. She is obese. She smells of tobacco. Skin is warm and dry. Color is normal.  HEENT is  unremarkable. Normocephalic/atraumatic. PERRL. Sclera are nonicteric. Neck is supple. No masses. No JVD. Lungs are coarse. Cardiac exam shows a regular rate and rhythm. Abdomen is soft. Extremities are without edema. Gait and ROM are intact. No gross neurologic deficits noted.  LABORATORY DATA:  Lab Results  Component Value Date   WBC 6.0 03/27/2012   HGB 13.0 03/27/2012   HCT 39.0 03/27/2012   PLT 237 03/27/2012   GLUCOSE 162* 03/27/2012   CHOL  Value: 143        ATP III CLASSIFICATION:  <200     mg/dL   Desirable  409-811  mg/dL   Borderline High  >=914    mg/dL   High        78/29/5621   TRIG 71 05/18/2010   HDL 38* 05/18/2010   LDLCALC  Value: 91        Total Cholesterol/HDL:CHD Risk Coronary Heart Disease Risk Table                     Men   Women  1/2 Average Risk   3.4   3.3  Average Risk       5.0   4.4  2 X Average Risk   9.6   7.1  3 X Average Risk  23.4   11.0        Use the calculated Patient Ratio above and the CHD Risk Table to determine the patient's CHD Risk.        ATP III CLASSIFICATION (LDL):  <100     mg/dL   Optimal  308-657  mg/dL   Near or Above                    Optimal  130-159  mg/dL   Borderline  846-962  mg/dL   High  >952     mg/dL   Very High 84/13/2440   ALT 12 03/27/2012   AST 18 03/27/2012   NA 140 03/27/2012   K 2.9* 03/27/2012   CL 104 03/27/2012   CREATININE 0.73 03/27/2012   BUN 6 03/27/2012   CO2 26 03/27/2012   TSH 0.59 09/15/2011   INR 1.04 05/17/2010   HGBA1C  Value: 6.0 (NOTE)  According to the ADA Clinical Practice Recommendations for 2011, when HbA1c is used as a screening test:   >=6.5%   Diagnostic of Diabetes Mellitus           (if abnormal result  is confirmed)  5.7-6.4%   Increased risk of developing Diabetes Mellitus  References:Diagnosis and Classification of Diabetes Mellitus,Diabetes Care,2011,34(Suppl 1):S62-S69 and Standards of Medical Care in         Diabetes - 2011,Diabetes  Care,2011,34  (Suppl 1):S11-S61.* 05/18/2010   Assessment / Plan:  1. HTN - Medication compliance is the crux of her issue. She is still waiting on her Medicaid. Not taking ARB due to cost. ACE allergic. She is only going to be able to afford $4 meds. I have started her on Aldactone 25 mg a day.   2. Systolic heart failure - she is agreeable to restarting the Coreg and the Hydralazine. We will half her dose of the nitrate and take at bedtime. I have explained to her that this is the cause of her headaches and that this will improve with time.  3. Hypokalemia - I'm not convinced she is taking her potassium. Aldactone was started today. Will plan on checking a BMET on Friday.   I will see her back in a week. Smoking cessation is encouraged especially in order to maintain medication compliance. Patient is agreeable to this plan and will call if any problems develop in the interim.

## 2012-03-30 ENCOUNTER — Ambulatory Visit: Payer: Self-pay | Admitting: Physician Assistant

## 2012-04-05 ENCOUNTER — Ambulatory Visit: Payer: Self-pay | Admitting: Nurse Practitioner

## 2012-04-06 ENCOUNTER — Ambulatory Visit: Payer: Self-pay | Admitting: Physician Assistant

## 2012-04-07 ENCOUNTER — Emergency Department (HOSPITAL_COMMUNITY)
Admission: EM | Admit: 2012-04-07 | Discharge: 2012-04-08 | Disposition: A | Discharge status: Home / Self Care | Payer: Self-pay | Attending: Emergency Medicine | Admitting: Emergency Medicine

## 2012-04-07 ENCOUNTER — Encounter (HOSPITAL_COMMUNITY): Payer: Self-pay | Admitting: Emergency Medicine

## 2012-04-07 DIAGNOSIS — E785 Hyperlipidemia, unspecified: Secondary | ICD-10-CM | POA: Insufficient documentation

## 2012-04-07 DIAGNOSIS — N760 Acute vaginitis: Secondary | ICD-10-CM | POA: Insufficient documentation

## 2012-04-07 DIAGNOSIS — Q054 Unspecified spina bifida with hydrocephalus: Secondary | ICD-10-CM | POA: Insufficient documentation

## 2012-04-07 DIAGNOSIS — B9689 Other specified bacterial agents as the cause of diseases classified elsewhere: Secondary | ICD-10-CM

## 2012-04-07 DIAGNOSIS — I10 Essential (primary) hypertension: Secondary | ICD-10-CM | POA: Insufficient documentation

## 2012-04-07 DIAGNOSIS — Z9119 Patient's noncompliance with other medical treatment and regimen: Secondary | ICD-10-CM | POA: Insufficient documentation

## 2012-04-07 DIAGNOSIS — R110 Nausea: Secondary | ICD-10-CM | POA: Insufficient documentation

## 2012-04-07 DIAGNOSIS — R109 Unspecified abdominal pain: Secondary | ICD-10-CM | POA: Insufficient documentation

## 2012-04-07 DIAGNOSIS — Z91199 Patient's noncompliance with other medical treatment and regimen due to unspecified reason: Secondary | ICD-10-CM | POA: Insufficient documentation

## 2012-04-07 DIAGNOSIS — I5022 Chronic systolic (congestive) heart failure: Secondary | ICD-10-CM | POA: Insufficient documentation

## 2012-04-07 DIAGNOSIS — Z8673 Personal history of transient ischemic attack (TIA), and cerebral infarction without residual deficits: Secondary | ICD-10-CM | POA: Insufficient documentation

## 2012-04-07 DIAGNOSIS — F17200 Nicotine dependence, unspecified, uncomplicated: Secondary | ICD-10-CM | POA: Insufficient documentation

## 2012-04-07 DIAGNOSIS — Z7982 Long term (current) use of aspirin: Secondary | ICD-10-CM | POA: Insufficient documentation

## 2012-04-07 DIAGNOSIS — Z79899 Other long term (current) drug therapy: Secondary | ICD-10-CM | POA: Insufficient documentation

## 2012-04-07 DIAGNOSIS — R21 Rash and other nonspecific skin eruption: Secondary | ICD-10-CM | POA: Insufficient documentation

## 2012-04-07 DIAGNOSIS — I428 Other cardiomyopathies: Secondary | ICD-10-CM | POA: Insufficient documentation

## 2012-04-07 DIAGNOSIS — L659 Nonscarring hair loss, unspecified: Secondary | ICD-10-CM | POA: Insufficient documentation

## 2012-04-07 LAB — CBC WITH DIFFERENTIAL/PLATELET
Eosinophils Absolute: 0.2 10*3/uL (ref 0.0–0.7)
Eosinophils Relative: 2 % (ref 0–5)
HCT: 40.2 % (ref 36.0–46.0)
Hemoglobin: 14 g/dL (ref 12.0–15.0)
Lymphocytes Relative: 44 % (ref 12–46)
Lymphs Abs: 3.7 10*3/uL (ref 0.7–4.0)
MCH: 28.5 pg (ref 26.0–34.0)
MCV: 81.9 fL (ref 78.0–100.0)
Monocytes Absolute: 0.6 10*3/uL (ref 0.1–1.0)
Monocytes Relative: 7 % (ref 3–12)
RBC: 4.91 MIL/uL (ref 3.87–5.11)
WBC: 8.4 10*3/uL (ref 4.0–10.5)

## 2012-04-07 LAB — LIPASE, BLOOD: Lipase: 27 U/L (ref 11–59)

## 2012-04-07 LAB — POCT PREGNANCY, URINE: Preg Test, Ur: NEGATIVE

## 2012-04-07 LAB — COMPREHENSIVE METABOLIC PANEL
ALT: 13 U/L (ref 0–35)
BUN: 7 mg/dL (ref 6–23)
CO2: 26 mEq/L (ref 19–32)
Calcium: 9.1 mg/dL (ref 8.4–10.5)
GFR calc Af Amer: >90 mL/min (ref >90)
GFR calc non Af Amer: >90 mL/min (ref >90)
Glucose, Bld: 104 mg/dL — ABNORMAL HIGH (ref 70–99)
Sodium: 135 mEq/L (ref 135–145)
Total Protein: 7.2 g/dL (ref 6.0–8.3)

## 2012-04-07 LAB — URINALYSIS, MICROSCOPIC ONLY
Hgb urine dipstick: NEGATIVE
Nitrite: NEGATIVE
Protein, ur: NEGATIVE mg/dL
Specific Gravity, Urine: 1.035 — ABNORMAL HIGH (ref 1.005–1.030)
Urobilinogen, UA: 0.2 mg/dL (ref 0.0–1.0)

## 2012-04-07 LAB — WET PREP, GENITAL
Trich, Wet Prep: NONE SEEN
Yeast Wet Prep HPF POC: NONE SEEN

## 2012-04-07 NOTE — ED Notes (Signed)
Pt. Complains of abdominal pain since last night.  Denies V/D but some nausea.  Pain is from midabdomen and radiates to left flank.  Denies urinary problems/pain

## 2012-04-08 MED ORDER — METRONIDAZOLE 500 MG PO TABS: 500.0000 mg | ORAL_TABLET | Freq: Two times a day (BID) | ORAL | Status: DC

## 2012-04-08 NOTE — ED Notes (Signed)
Pt dc to home with family.  Pt states understanding to dc instructions.

## 2012-04-08 NOTE — ED Provider Notes (Signed)
Medical screening examination/treatment/procedure(s) were performed by non-physician practitioner and as supervising physician I was immediately available for consultation/collaboration.   Glynn Octave, MD 04/08/12 1146

## 2012-04-08 NOTE — ED Provider Notes (Signed)
History     CSN: [id number]  Arrival date & time 04/07/12  2017   First MD Initiated Contact with Patient 04/07/12 2203      Chief Complaint  Patient presents with  . Abdominal Pain    (Consider location/radiation/quality/duration/timing/severity/associated sxs/prior treatment) HPI Chelsea Branch is a 46 y.o. female who presents with complaint of abdominal pain. States pain started last night. Pain is suprapubic radiating into left flank. States associated with nausea, no vomiting. No pain with urination. States her urine looks "dark." Denies urinary frequency, urgency. States no vaginal complaints. Denies taking any medications for it. Denies fever, chill, malaise. Nothing makes it better. Suprapubic palpation makes it worse. Last bowel movement today and is normal.    Past Medical History  Diagnosis Date  . Chronic systolic heart failure   . Stroke   . Hypertension   . MITRAL REGURGITATION 10/15/2008    Qualifier: Diagnosis of  By: Denyse Amass CMA, Okey Regal    . DYSLIPIDEMIA 06/08/2010    Qualifier: Diagnosis of  By: Huntley Dec, Scott    . 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 Alopecia   . 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 Noncompliance   . Tobacco abuse     Past Surgical History  Procedure Date  . Caesarian     No family history on file.  History  Substance Use Topics  . Smoking status: Current Every Day Smoker -- 0.2 packs/day    Types: Cigarettes  . Smokeless tobacco: Not on file  . Alcohol Use: No    OB History    Grav Para Term Preterm Abortions TAB SAB Ect Mult Living                  Review of Systems  Gastrointestinal: Positive for nausea and abdominal pain. Negative for vomiting.  Skin: Positive for rash.  All other systems reviewed and are negative.    Allergies  Ace inhibitors  Home Medications   Current Outpatient Rx    Name  Route  Sig  Dispense  Refill  . ASPIRIN 325 MG PO TABS   Oral   Take 162 mg by mouth daily.         Marland Kitchen CARVEDILOL 3.125 MG PO TABS   Oral   Take 1 tablet (3.125 mg total) by mouth 2 (two) times daily with a meal.   60 tablet   12   . FERROUS SULFATE 325 (65 FE) MG PO TABS   Oral   Take 1 tablet (325 mg total) by mouth 2 (two) times daily.   60 tablet   4   . OMEGA-3 FATTY ACIDS 1000 MG PO CAPS   Oral   Take 1 capsule (1 g total) by mouth daily.   30 capsule   4   . FUROSEMIDE 20 MG PO TABS   Oral   Take 1 tablet (20 mg total) by mouth daily.   60 tablet   5   . HYDRALAZINE HCL 25 MG PO TABS   Oral   Take 1 tablet (25 mg total) by mouth 2 (two) times daily.   60 tablet   11   . ISOSORBIDE MONONITRATE ER 30 MG PO TB24   Oral   Take 0.5 tablets (15 mg total) by mouth daily.         Marland Kitchen POTASSIUM CHLORIDE CRYS ER 20  MEQ PO TBCR   Oral   Take 1 tablet (20 mEq total) by mouth daily.   30 tablet   5   . PRAVASTATIN SODIUM 20 MG PO TABS   Oral   Take 1 tablet (20 mg total) by mouth daily.   30 tablet   4   . SPIRONOLACTONE 25 MG PO TABS   Oral   Take 1 tablet (25 mg total) by mouth daily.   30 tablet   6     BP 155/93  Pulse 82  Temp 97.7 F (36.5 C) (Oral)  Resp 18  SpO2 96%  LMP 03/13/2012  Physical Exam  Nursing note and vitals reviewed. Constitutional: She is oriented to person, place, and time. She appears well-developed and well-nourished. No distress.  Eyes: Conjunctivae normal are normal.  Neck: Neck supple.  Cardiovascular: Normal rate, regular rhythm and normal heart sounds.   Pulmonary/Chest: Effort normal and breath sounds normal. No respiratory distress. She has no wheezes. She has no rales.  Abdominal: Soft. Bowel sounds are normal. She exhibits no distension. There is tenderness. There is no rebound.       Suprapubic tenderness, no guarding. No CVA tenderness  Genitourinary:       Normal external genitalia. Normal vaginal  canal, thin white discharge, no CMT. Uterine tenderness. No adnexal tenderness bilaterally  Musculoskeletal: She exhibits no edema.  Neurological: She is alert and oriented to person, place, and time.  Skin: Skin is warm and dry.       1cm erythemotous area to the left ankle with central insect bite.   Psychiatric: She has a normal mood and affect.    ED Course  Procedures (including critical care time)   Pt with suprapubic pain, NAD, non toxic. Will get labs, pelvic exam  Results for orders placed during the hospital encounter of 04/07/12  CBC WITH DIFFERENTIAL      Component Value Range   WBC 8.4  4.0 - 10.5 K/uL   RBC 4.91  3.87 - 5.11 MIL/uL   Hemoglobin 14.0  12.0 - 15.0 g/dL   HCT 16.1  09.6 - 04.5 %   MCV 81.9  78.0 - 100.0 fL   MCH 28.5  26.0 - 34.0 pg   MCHC 34.8  30.0 - 36.0 g/dL   RDW 40.9  81.1 - 91.4 %   Platelets 266  150 - 400 K/uL   Neutrophils Relative 46  43 - 77 %   Neutro Abs 3.9  1.7 - 7.7 K/uL   Lymphocytes Relative 44  12 - 46 %   Lymphs Abs 3.7  0.7 - 4.0 K/uL   Monocytes Relative 7  3 - 12 %   Monocytes Absolute 0.6  0.1 - 1.0 K/uL   Eosinophils Relative 2  0 - 5 %   Eosinophils Absolute 0.2  0.0 - 0.7 K/uL   Basophils Relative 0  0 - 1 %   Basophils Absolute 0.0  0.0 - 0.1 K/uL  COMPREHENSIVE METABOLIC PANEL      Component Value Range   Sodium 135  135 - 145 mEq/L   Potassium 3.3 (*) 3.5 - 5.1 mEq/L   Chloride 101  96 - 112 mEq/L   CO2 26  19 - 32 mEq/L   Glucose, Bld 104 (*) 70 - 99 mg/dL   BUN 7  6 - 23 mg/dL   Creatinine, Ser 7.82  0.50 - 1.10 mg/dL   Calcium 9.1  8.4 - 95.6 mg/dL   Total  Protein 7.2  6.0 - 8.3 g/dL   Albumin 3.4 (*) 3.5 - 5.2 g/dL   AST 17  0 - 37 U/L   ALT 13  0 - 35 U/L   Alkaline Phosphatase 102  39 - 117 U/L   Total Bilirubin 0.3  0.3 - 1.2 mg/dL   GFR calc non Af Amer >90  >90 mL/min   GFR calc Af Amer >90  >90 mL/min  LIPASE, BLOOD      Component Value Range   Lipase 27  11 - 59 U/L  URINALYSIS, MICROSCOPIC  ONLY      Component Value Range   Color, Urine AMBER (*) YELLOW   APPearance CLOUDY (*) CLEAR   Specific Gravity, Urine 1.035 (*) 1.005 - 1.030   pH 5.0  5.0 - 8.0   Glucose, UA NEGATIVE  NEGATIVE mg/dL   Hgb urine dipstick NEGATIVE  NEGATIVE   Bilirubin Urine SMALL (*) NEGATIVE   Ketones, ur NEGATIVE  NEGATIVE mg/dL   Protein, ur NEGATIVE  NEGATIVE mg/dL   Urobilinogen, UA 0.2  0.0 - 1.0 mg/dL   Nitrite NEGATIVE  NEGATIVE   Leukocytes, UA NEGATIVE  NEGATIVE   WBC, UA 0-2  <3 WBC/hpf   RBC / HPF 0-2  <3 RBC/hpf   Bacteria, UA RARE  RARE   Squamous Epithelial / LPF MANY (*) RARE  POCT PREGNANCY, URINE      Component Value Range   Preg Test, Ur NEGATIVE  NEGATIVE  WET PREP, GENITAL      Component Value Range   Yeast Wet Prep HPF POC NONE SEEN  NONE SEEN   Trich, Wet Prep NONE SEEN  NONE SEEN   Clue Cells Wet Prep HPF POC MODERATE (*) NONE SEEN   WBC, Wet Prep HPF POC FEW (*) NONE SEEN     .    1. Abdominal pain   2. Rash   3. BV (bacterial vaginosis)       MDM  Pt with suprapubic abd pain. No guarding. UA negative. Pelvic exam shows tenderness over uterus, many clue cells. Will treat for BV. Normal adnexa. Pt will probably need further imaging to r/o fibroids vs endometriosis. No signs of ovarian torsion. Labs unremarkable. No problems with BMs, doubt colitis or appendicitis.         Lottie Mussel, PA 04/08/12 [phone]

## 2012-04-09 LAB — GC/CHLAMYDIA PROBE AMP: CT Probe RNA: NEGATIVE

## 2012-04-19 ENCOUNTER — Encounter (HOSPITAL_COMMUNITY): Payer: Self-pay | Admitting: Emergency Medicine

## 2012-04-19 ENCOUNTER — Emergency Department (HOSPITAL_COMMUNITY)
Admission: EM | Admit: 2012-04-19 | Discharge: 2012-04-20 | Disposition: A | Discharge status: Home / Self Care | Payer: Self-pay | Attending: Emergency Medicine | Admitting: Emergency Medicine

## 2012-04-19 ENCOUNTER — Emergency Department (HOSPITAL_COMMUNITY): Payer: Self-pay

## 2012-04-19 DIAGNOSIS — C579 Malignant neoplasm of female genital organ, unspecified: Secondary | ICD-10-CM | POA: Insufficient documentation

## 2012-04-19 DIAGNOSIS — Z3202 Encounter for pregnancy test, result negative: Secondary | ICD-10-CM | POA: Insufficient documentation

## 2012-04-19 DIAGNOSIS — I059 Rheumatic mitral valve disease, unspecified: Secondary | ICD-10-CM | POA: Insufficient documentation

## 2012-04-19 DIAGNOSIS — I10 Essential (primary) hypertension: Secondary | ICD-10-CM | POA: Insufficient documentation

## 2012-04-19 DIAGNOSIS — I428 Other cardiomyopathies: Secondary | ICD-10-CM | POA: Insufficient documentation

## 2012-04-19 DIAGNOSIS — R109 Unspecified abdominal pain: Secondary | ICD-10-CM | POA: Insufficient documentation

## 2012-04-19 DIAGNOSIS — I5022 Chronic systolic (congestive) heart failure: Secondary | ICD-10-CM | POA: Insufficient documentation

## 2012-04-19 DIAGNOSIS — N83209 Unspecified ovarian cyst, unspecified side: Secondary | ICD-10-CM | POA: Insufficient documentation

## 2012-04-19 DIAGNOSIS — E785 Hyperlipidemia, unspecified: Secondary | ICD-10-CM | POA: Insufficient documentation

## 2012-04-19 DIAGNOSIS — Z79899 Other long term (current) drug therapy: Secondary | ICD-10-CM | POA: Insufficient documentation

## 2012-04-19 DIAGNOSIS — D219 Benign neoplasm of connective and other soft tissue, unspecified: Secondary | ICD-10-CM

## 2012-04-19 DIAGNOSIS — F17200 Nicotine dependence, unspecified, uncomplicated: Secondary | ICD-10-CM | POA: Insufficient documentation

## 2012-04-19 DIAGNOSIS — Z8673 Personal history of transient ischemic attack (TIA), and cerebral infarction without residual deficits: Secondary | ICD-10-CM | POA: Insufficient documentation

## 2012-04-19 LAB — CBC WITH DIFFERENTIAL/PLATELET
Eosinophils Absolute: 0.1 10*3/uL (ref 0.0–0.7)
Hemoglobin: 11.7 g/dL — ABNORMAL LOW (ref 12.0–15.0)
Lymphocytes Relative: 24 % (ref 12–46)
Lymphs Abs: 2.3 10*3/uL (ref 0.7–4.0)
Neutro Abs: 6.5 10*3/uL (ref 1.7–7.7)
Neutrophils Relative %: 70 % (ref 43–77)
Platelets: 272 10*3/uL (ref 150–400)
RBC: 4.32 MIL/uL (ref 3.87–5.11)
WBC: 9.3 10*3/uL (ref 4.0–10.5)

## 2012-04-19 LAB — WET PREP, GENITAL: WBC, Wet Prep HPF POC: NONE SEEN

## 2012-04-19 NOTE — ED Notes (Signed)
PA at bedside.

## 2012-04-19 NOTE — ED Provider Notes (Signed)
History     CSN: [id number]  Arrival date & time 04/19/12  2122   First MD Initiated Contact with Patient 04/19/12 2203      Chief Complaint  Patient presents with  . Vaginal Bleeding    (Consider location/radiation/quality/duration/timing/severity/associated sxs/prior treatment) Patient is a 46 y.o. female presenting with vaginal bleeding. The history is provided by the patient. No language interpreter was used.  Vaginal Bleeding This is a new problem. The current episode started in the past 7 days. The problem occurs constantly. The problem has been gradually worsening. Associated symptoms include abdominal pain. Nothing aggravates the symptoms. She has tried nothing for the symptoms.   Pt complains of heavy vaginal bleeding for a week.  Pt reports period normally has stop within 7 days but it seems to be getting hevier.  Pt reports she has lower abdominal cramping.   Past Medical History  Diagnosis Date  . Chronic systolic heart failure   . Stroke   . Hypertension   . MITRAL REGURGITATION 10/15/2008    Qualifier: Diagnosis of  By: Denyse Amass CMA, Okey Regal    . DYSLIPIDEMIA 06/08/2010    Qualifier: Diagnosis of  By: Huntley Dec, Scott    . 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 Alopecia   . 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 Noncompliance   . Tobacco abuse     Past Surgical History  Procedure Date  . Caesarian   . Cesarean section     History reviewed. No pertinent family history.  History  Substance Use Topics  . Smoking status: Current Every Day Smoker -- 0.2 packs/day    Types: Cigarettes  . Smokeless tobacco: Not on file  . Alcohol Use: No    OB History    Grav Para Term Preterm Abortions TAB SAB Ect Mult Living                  Review of Systems  Gastrointestinal: Positive for abdominal pain.  Genitourinary: Positive for vaginal  bleeding.  All other systems reviewed and are negative.    Allergies  Ace inhibitors  Home Medications   Current Outpatient Rx  Name  Route  Sig  Dispense  Refill  . ASPIRIN 325 MG PO TABS   Oral   Take 162 mg by mouth daily.         Marland Kitchen CARVEDILOL 3.125 MG PO TABS   Oral   Take 1 tablet (3.125 mg total) by mouth 2 (two) times daily with a meal.   60 tablet   12   . FERROUS SULFATE 325 (65 FE) MG PO TABS   Oral   Take 1 tablet (325 mg total) by mouth 2 (two) times daily.   60 tablet   4   . OMEGA-3 FATTY ACIDS 1000 MG PO CAPS   Oral   Take 1 capsule (1 g total) by mouth daily.   30 capsule   4   . FUROSEMIDE 20 MG PO TABS   Oral   Take 1 tablet (20 mg total) by mouth daily.   60 tablet   5   . HYDRALAZINE HCL 25 MG PO TABS   Oral   Take 1 tablet (25 mg total) by mouth 2 (two) times daily.   60 tablet   11   . IBUPROFEN 200 MG PO TABS   Oral   Take  800 mg by mouth every 6 (six) hours as needed. For pain         . ISOSORBIDE MONONITRATE ER 30 MG PO TB24   Oral   Take 0.5 tablets (15 mg total) by mouth daily.         Marland Kitchen METRONIDAZOLE 500 MG PO TABS   Oral   Take 1 tablet (500 mg total) by mouth 2 (two) times daily.   14 tablet   0   . POTASSIUM CHLORIDE CRYS ER 20 MEQ PO TBCR   Oral   Take 1 tablet (20 mEq total) by mouth daily.   30 tablet   5   . PRAVASTATIN SODIUM 20 MG PO TABS   Oral   Take 1 tablet (20 mg total) by mouth daily.   30 tablet   4   . SPIRONOLACTONE 25 MG PO TABS   Oral   Take 1 tablet (25 mg total) by mouth daily.   30 tablet   6     BP 150/88  Pulse 100  Temp 98.4 F (36.9 C) (Oral)  Resp 18  SpO2 98%  LMP 04/11/2012  Physical Exam  Nursing note and vitals reviewed. Constitutional: She is oriented to person, place, and time. She appears well-developed and well-nourished.  HENT:  Head: Normocephalic and atraumatic.  Right Ear: External ear normal.  Left Ear: External ear normal.  Nose: Nose normal.    Mouth/Throat: Oropharynx is clear and moist.  Eyes: Pupils are equal, round, and reactive to light.  Neck: Normal range of motion. Neck supple.  Cardiovascular: Normal rate and normal heart sounds.   Pulmonary/Chest: Effort normal and breath sounds normal.  Abdominal: Soft. There is tenderness.  Genitourinary:       Moderate vaginal bleeding,  Uterus enlarged,  Hard,  Musculoskeletal: Normal range of motion.  Neurological: She is alert and oriented to person, place, and time. She has normal reflexes.  Skin: Skin is warm.  Psychiatric: She has a normal mood and affect.    ED Course  Procedures (including critical care time)  Labs Reviewed  WET PREP, GENITAL - Abnormal; Notable for the following:    Clue Cells Wet Prep HPF POC MODERATE (*)     All other components within normal limits  CBC WITH DIFFERENTIAL - Abnormal; Notable for the following:    Hemoglobin 11.7 (*)     HCT 35.0 (*)     All other components within normal limits  PREGNANCY, URINE  GC/CHLAMYDIA PROBE AMP   No results found.   No diagnosis found.    MDM  Pt's hemoglobin is 11.7.  Wet prep shows clue cells,  Pt advised to call Gyn clinic to schedule appointment to be seen for evaluation of fibroids and ovarian cyst.  Pt given rx for flagyl,  Ibuprofen 800mg  and hydrocodone for pain       Elson Areas, PA 04/19/12 2358  Lonia Skinner [place], [place] 04/20/12 0045

## 2012-04-19 NOTE — ED Notes (Signed)
Pt states she began menstruating last Tuesday and thought she stopped today but today "i felt a rush and a lot of clots came out of my uterus. I'm hurting like I'm having a baby." Pt states pain is "in my left side and in my uterus."

## 2012-04-19 NOTE — ED Notes (Signed)
Pt. Presents to the ED with lower abdominal pain and cramping and vaginal bleeding.  Pt. States that she has been bleeding for one week and a day and has been clotting heavily.

## 2012-04-20 MED ORDER — METRONIDAZOLE 500 MG PO TABS: 500.0000 mg | ORAL_TABLET | Freq: Two times a day (BID) | ORAL | Status: DC

## 2012-04-20 MED ORDER — IBUPROFEN 800 MG PO TABS: 800.0000 mg | ORAL_TABLET | Freq: Three times a day (TID) | ORAL | Status: DC

## 2012-04-20 MED ORDER — HYDROCODONE-ACETAMINOPHEN 5-325 MG PO TABS: 2.0000 | ORAL_TABLET | ORAL | Status: DC | PRN

## 2012-04-20 MED ORDER — KETOROLAC TROMETHAMINE 60 MG/2ML IM SOLN
60.0000 mg | Freq: Once | INTRAMUSCULAR | Status: AC
  Administered 2012-04-20: 60 mg via INTRAMUSCULAR
  Filled 2012-04-20: qty 2

## 2012-04-20 NOTE — ED Provider Notes (Signed)
Medical screening examination/treatment/procedure(s) were performed by non-physician practitioner and as supervising physician I was immediately available for consultation/collaboration.   Aleisa Howk, MD 04/20/12 2348 

## 2012-04-21 ENCOUNTER — Inpatient Hospital Stay (HOSPITAL_COMMUNITY)
Admission: AD | Admit: 2012-04-21 | Discharge: 2012-04-21 | Disposition: A | Discharge status: Home / Self Care | Payer: Self-pay | Source: Ambulatory Visit | Attending: Family Medicine | Admitting: Family Medicine

## 2012-04-21 ENCOUNTER — Encounter (HOSPITAL_COMMUNITY): Payer: Self-pay | Admitting: *Deleted

## 2012-04-21 DIAGNOSIS — N920 Excessive and frequent menstruation with regular cycle: Secondary | ICD-10-CM | POA: Insufficient documentation

## 2012-04-21 DIAGNOSIS — I428 Other cardiomyopathies: Secondary | ICD-10-CM | POA: Insufficient documentation

## 2012-04-21 HISTORY — DX: Benign neoplasm of connective and other soft tissue, unspecified: D21.9

## 2012-04-21 MED ORDER — MEGESTROL ACETATE 40 MG PO TABS
40.0000 mg | ORAL_TABLET | Freq: Once | ORAL | Status: AC
  Administered 2012-04-21: 40 mg via ORAL
  Filled 2012-04-21: qty 1

## 2012-04-21 MED ORDER — MEGESTROL ACETATE 20 MG PO TABS: 40.0000 mg | ORAL_TABLET | Freq: Every day | ORAL | Status: DC

## 2012-04-21 NOTE — MAU Provider Note (Signed)
Chart reviewed and agree with management and plan.  

## 2012-04-21 NOTE — MAU Note (Signed)
Pt started having vaginal bleeding today at work about 30 mins. Ago.  Pt soaked maxipad and bleed through all clothes.   States pad was not able to absorb bleeding with golf ball size clots.  Pt was seen at [facility] on 04-20-12 for vaginal bleeding.

## 2012-04-21 NOTE — MAU Provider Note (Signed)
History     CSN: [id number]  Arrival date and time: 04/21/12 1310   First Provider Initiated Contact with Patient 04/21/12 1355      Chief Complaint  Patient presents with  . Vaginal Bleeding   HPI This is a 46 y.o. female who presents with c/o heavy vaginal bleeding  Was at work and flow restarted and she passed a large clot. No dizziness. Was seen 2 days ago for the same and was given pain medicine and referred to clinic. Also has cramps with this. Has had this happen once before, but usually is normal flow. Has medical history remarkable for cardiac issues.   RN Note: Pt started having vaginal bleeding today at work about 30 mins. Ago. Pt soaked maxipad and bleed through all clothes. States pad was not able to absorb bleeding with golf ball size clots. Pt was seen at [facility] on 04-20-12 for vaginal bleeding.      OB History    Grav Para Term Preterm Abortions TAB SAB Ect Mult Living   3 3 3       3       Past Medical History  Diagnosis Date  . Chronic systolic heart failure   . Stroke   . Hypertension   . MITRAL REGURGITATION 10/15/2008    Qualifier: Diagnosis of  By: Denyse Amass CMA, Okey Regal    . DYSLIPIDEMIA 06/08/2010    Qualifier: Diagnosis of  By: Huntley Dec, Scott    . 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 Alopecia   . 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 Noncompliance   . Tobacco abuse   . CHF (congestive heart failure)   . Fibroids     Past Surgical History  Procedure Date  . Caesarian   . Cesarean section   . Heart loop recorder     Family History  Problem Relation Age of Onset  . Other Neg Hx     History  Substance Use Topics  . Smoking status: Current Every Day Smoker -- 0.2 packs/day    Types: Cigarettes  . Smokeless tobacco: Not on file  . Alcohol Use: No    Allergies:  Allergies  Allergen Reactions  . Ace Inhibitors       REACTION: Cough    Prescriptions prior to admission  Medication Sig Dispense Refill  . aspirin 325 MG tablet Take 162 mg by mouth daily.      . carvedilol (COREG) 3.125 MG tablet Take 1 tablet (3.125 mg total) by mouth 2 (two) times daily with a meal.  60 tablet  12  . ferrous sulfate 325 (65 FE) MG tablet Take 1 tablet (325 mg total) by mouth 2 (two) times daily.  60 tablet  4  . fish oil-omega-3 fatty acids 1000 MG capsule Take 1 capsule (1 g total) by mouth daily.  30 capsule  4  . furosemide (LASIX) 20 MG tablet Take 1 tablet (20 mg total) by mouth daily.  60 tablet  5  . hydrALAZINE (APRESOLINE) 25 MG tablet Take 1 tablet (25 mg total) by mouth 2 (two) times daily.  60 tablet  11  . potassium chloride SA (KLOR-CON M20) 20 MEQ tablet Take 1 tablet (20 mEq total) by mouth daily.  30 tablet  5  . pravastatin (PRAVACHOL) 20 MG tablet Take 1 tablet (20 mg total) by mouth daily.  30 tablet  4  . spironolactone (ALDACTONE) 25 MG tablet Take 1 tablet (25 mg total) by mouth daily.  30 tablet  6    ROS See HPI  Physical Exam   Blood pressure 136/83, pulse 85, temperature 98 F (36.7 C), temperature source Oral, resp. rate 18, height 5\' 8"  (1.727 m), weight 195 lb (88.451 kg), last menstrual period 04/11/2012, SpO2 100.00%.  Physical Exam  Constitutional: She is oriented to person, place, and time. She appears well-developed and well-nourished. No distress.  HENT:  Head: Normocephalic.  Cardiovascular: Normal rate.   Respiratory: Effort normal.  GI: Soft. She exhibits no distension and no mass. There is no tenderness. There is no rebound and no guarding.  Genitourinary: Uterus normal. Vaginal discharge (Bleeding now small. No clots observed.) found.  Musculoskeletal: Normal range of motion.  Neurological: She is alert and oriented to person, place, and time.  Skin: Skin is warm and dry.  Psychiatric: She has a normal mood and affect.  Pelvic deferred due to just having one done. At  that time uterus was firm and slightly enlarged.   US Pelvis Complete  04/20/2012  *RADIOLOGY REPORT*  Clinical Data: Vaginal bleeding and pelvic cramping.  TRANSABDOMINAL AND TRANSVAGINAL ULTRASOUND OF PELVIS Technique:  Both transabdominal and transvaginal ultrasound examinations of the pelvis were performed. Transabdominal technique was performed for global imaging of the pelvis including uterus, ovaries, adnexal regions, and pelvic cul-de-sac.  It was necessary to proceed with endovaginal exam following the transabdominal exam to visualize the uterus in greater detail.  Comparison:  None  Findings:  Uterus: Diffusely enlarged, measuring 15.0 x 7.6 x 10.0 cm.  There is fluid filling the endometrial canal at the level of the fundus, with a large 8.6 x 5.8 x 4.6 cm region of heterogeneous echogenicity filling the lower endometrial canal and extending to the cervix.  No associated blood flow is seen; this could reflect a large clot, though the fluid filling the endometrial canal at the level of fundus suggests some degree of obstruction.  Two fibroids are identified, both myometrial in nature.  The anterior fibroid measures 3.2 x 3.0 x 2.3 cm, while the posterior fibroid measures 3.3 x 3.0 x 2.6 cm.  Endometrium: Filled with heterogeneous echogenic material, measuring 4.7 cm in thickness.  Right ovary:  Normal appearance/no adnexal mass; measures 4.6 x 3.1 x 3.2 cm.  Left ovary: Normal appearance/no suspicious adnexal mass.  Measures 5.6 x 5.2 x 3.9 cm.  A 3.2 cm cyst is likely physiologic in nature.  Other findings: No free fluid seen within the pelvic cul-de-sac.  IMPRESSION:  1.  Large 8.6 x 5.8 x 4.6 cm region of heterogeneous echogenicity fills the lower endometrial canal and extends to the cervix; no associated blood flow is seen.  This could reflect a very large clot, though it appears to be causing some degree of obstruction given simple fluid filling the endometrial canal at the level of the fundus.   Suggest clinical correlation for a large clot; if the patient's symptoms do not improve, further imaging evaluation might be considered. 2.  Two fibroids noted within the uterus.   Original Report Authenticated By: Tonia Ghent, M.D.     MAU Course  Procedures  MDM Suspect the clot she passed may have been the one in the [place]. Hopefully bleeding will slow, but will do a trial of Megace anyway to slow the flow.  Will message clinic to get an appt. Will need long term plan to get her through to  menopause.   Assessment and Plan  A:  Menorrhagia, probably perimenopausal, probably anovulatory      Multiple medical problems, including aneurism and cardiomyopathy       P:  Discharge       Discussed with Dr Shawnie Pons       Rx Megace 40mg        Refer to GYN clinic for long term management  [facility] 04/21/2012, 2:27 PM

## 2012-04-22 LAB — GC/CHLAMYDIA PROBE AMP
CT Probe RNA: NEGATIVE
GC Probe RNA: NEGATIVE

## 2012-05-08 ENCOUNTER — Ambulatory Visit (INDEPENDENT_AMBULATORY_CARE_PROVIDER_SITE_OTHER): Payer: Self-pay | Admitting: Family Medicine

## 2012-05-08 ENCOUNTER — Encounter: Payer: Self-pay | Admitting: Family Medicine

## 2012-05-08 ENCOUNTER — Other Ambulatory Visit (HOSPITAL_COMMUNITY)
Admission: RE | Admit: 2012-05-08 | Discharge: 2012-05-08 | Disposition: A | Discharge status: Home / Self Care | Payer: Self-pay | Source: Ambulatory Visit | Attending: Family Medicine | Admitting: Family Medicine

## 2012-05-08 VITALS — BP 149/86 | HR 79 | Temp 97.7°F | Wt 197.0 lb

## 2012-05-08 DIAGNOSIS — N920 Excessive and frequent menstruation with regular cycle: Secondary | ICD-10-CM | POA: Insufficient documentation

## 2012-05-08 DIAGNOSIS — Z1239 Encounter for other screening for malignant neoplasm of breast: Secondary | ICD-10-CM

## 2012-05-08 DIAGNOSIS — N8500 Endometrial hyperplasia, unspecified: Secondary | ICD-10-CM | POA: Insufficient documentation

## 2012-05-08 DIAGNOSIS — Z1231 Encounter for screening mammogram for malignant neoplasm of breast: Secondary | ICD-10-CM

## 2012-05-08 NOTE — Progress Notes (Signed)
Subjective:    [name], female    DOB: [birth year], 46 y.o.   MRN: [id number]  HPI Here today for ER f/u with heavy vaginal bleeding.  She has had an u/s that showed 15 wk size fibroid uterus.  Placed on Megace and that improved bleeding.  Gives 1 year h/o increasingly heavy bleeding and longer cycle length.  Previously cycles were monthly and last 3-4 days.  Now lasting 7-10 days with increased flow.  She has a very poor medical hx with cardiomyopathy  EF 25-30%, prev. CVA.  Recent TSH 4/13 was WNL. Past Medical History  Diagnosis Date  . Chronic systolic heart failure   . Stroke   . Hypertension   . MITRAL REGURGITATION 10/15/2008    Qualifier: Diagnosis of  By: Denyse Amass CMA, Okey Regal    . DYSLIPIDEMIA 06/08/2010    Qualifier: Diagnosis of  By: Huntley Dec, Scott    . 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 Alopecia   . 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 Noncompliance   . Tobacco abuse   . CHF (congestive heart failure)   . Fibroids    Past Surgical History  Procedure Date  . Caesarian   . Cesarean section   . Heart loop recorder   . Tubal ligation    History   Social History  . Marital Status: Single    Spouse Name: N/A    Number of Children: N/A  . Years of Education: N/A   Occupational History  . Not on file.   Social History Main Topics  . Smoking status: Current Every Day Smoker -- 0.2 packs/day    Types: Cigarettes  . Smokeless tobacco: Not on file  . Alcohol Use: No  . Drug Use: No  . Sexually Active: Yes   Other Topics Concern  . Not on file   Social History Narrative  . No narrative on file   Family History  Problem Relation Age of Onset  . Other Neg Hx   . Cancer Maternal Grandmother     uterine  . Hypertension Sister    Current Outpatient Prescriptions on File Prior to Visit  Medication Sig Dispense Refill   . carvedilol (COREG) 3.125 MG tablet Take 1 tablet (3.125 mg total) by mouth 2 (two) times daily with a meal.  60 tablet  12  . ferrous sulfate 325 (65 FE) MG tablet Take 1 tablet (325 mg total) by mouth 2 (two) times daily.  60 tablet  4  . fish oil-omega-3 fatty acids 1000 MG capsule Take 1 capsule (1 g total) by mouth daily.  30 capsule  4  . furosemide (LASIX) 20 MG tablet Take 1 tablet (20 mg total) by mouth daily.  60 tablet  5  . hydrALAZINE (APRESOLINE) 25 MG tablet Take 1 tablet (25 mg total) by mouth 2 (two) times daily.  60 tablet  11  . megestrol (MEGACE) 20 MG tablet Take 2 tablets (40 mg total) by mouth daily.  10 tablet  0  . potassium chloride SA (KLOR-CON M20) 20 MEQ tablet Take 1 tablet (20 mEq total) by mouth daily.  30 tablet  5  . pravastatin (PRAVACHOL) 20 MG tablet Take 1 tablet (20 mg total) by mouth daily.  30 tablet  4  . spironolactone (ALDACTONE) 25 MG tablet Take 1 tablet (25 mg total) by mouth  daily.  30 tablet  6  . aspirin 325 MG tablet Take 162 mg by mouth daily.       Allergies  Allergen Reactions  . Ace Inhibitors     REACTION: Cough      Review of Systems  Constitutional: Negative for fever and chills.  Respiratory: Negative for shortness of breath.   Cardiovascular: Negative for chest pain and leg swelling.  Gastrointestinal: Negative for nausea, vomiting and diarrhea.       Bloating  Genitourinary: Positive for menstrual problem. Negative for dysuria.  Neurological: Positive for light-headedness and headaches.       Objective:   Physical Exam  Vitals reviewed. Constitutional: She appears well-developed and well-nourished.  HENT:  Head: Normocephalic and atraumatic.  Eyes: No scleral icterus.  Neck: Neck supple.  Cardiovascular: Normal rate.   Pulmonary/Chest: Effort normal.  Abdominal: Soft. She exhibits mass.  Genitourinary: Vagina normal. There is no lesion on the right labia. There is no lesion on the left labia. Uterus is enlarged  (16 wk size and very firm) and tender. Cervix exhibits no motion tenderness and no discharge. Right adnexum displays no mass and no tenderness. Left adnexum displays no mass and no tenderness.   Patient given informed consent, signed copy in the chart, time out was performed. Appropriate time out taken. . The patient was placed in the lithotomy position and the cervix brought into view with sterile speculum.  Portio of cervix cleansed x 2 with betadine swabs.  A tenaculum was placed in the anterior lip of the cervix.  The uterus was sounded for depth of 10. A pipelle was introduced to into the uterus, suction created,  and an endometrial sample was obtained. All equipment was removed and accounted for.  The patient tolerated the procedure well.    Patient given post procedure instructions.         Assessment & Plan:

## 2012-05-08 NOTE — Patient Instructions (Addendum)
Endometrial Biopsy This is a test in which a tissue sample (a biopsy) is taken from inside the uterus (womb). It is then looked at by a specialist under a microscope to see if the tissue is normal or abnormal. The endometrium is the lining of the uterus. This test helps determine where you are in your menstrual cycle and how hormone levels are affecting the lining of the uterus. Another use for this test is to diagnose endometrial cancer, tuberculosis, polyps, or inflammatory conditions and to evaluate uterine bleeding. PREPARATION FOR TEST No preparation or fasting is necessary. NORMAL FINDINGS No pathologic conditions. Presence of "secretory-type" endometrium 3 to 5 days before to normal menstruation. Ranges for normal findings may vary among different laboratories and hospitals. You should always check with your doctor after having lab work or other tests done to discuss the meaning of your test results and whether your values are considered within normal limits. MEANING OF TEST  Your caregiver will go over the test results with you and discuss the importance and meaning of your results, as well as treatment options and the need for additional tests if necessary. OBTAINING THE TEST RESULTS It is your responsibility to obtain your test results. Ask the lab or department performing the test when and how you will get your results. Document Released: 09/10/2004 Document Revised: 08/02/2011 Document Reviewed: 04/19/2008 [facility] Patient Information 2013 [place], [place]. Menorrhagia Dysfunctional uterine bleeding is different from a normal menstrual period. When periods are heavy or there is more bleeding than is usual for you, it is called menorrhagia. It may be caused by hormonal imbalance, or physical, metabolic, or other problems. Examination is necessary in order that your caregiver may treat treatable causes. If this is a continuing problem, a D&C may be needed. That means that the cervix (the  opening of the uterus or womb) is dilated (stretched larger) and the lining of the uterus is scraped out. The tissue scraped out is then examined under a microscope by a specialist (pathologist) to make sure there is nothing of concern that needs further or more extensive treatment. HOME CARE INSTRUCTIONS   If medications were prescribed, take exactly as directed. Do not change or switch medications without consulting your caregiver.  Long term heavy bleeding may result in iron deficiency. Your caregiver may have prescribed iron pills. They help replace the iron your body lost from heavy bleeding. Take exactly as directed. Iron may cause constipation. If this becomes a problem, increase the bran, fruits, and roughage in your diet.  Do not take aspirin or medicines that contain aspirin one week before or during your menstrual period. Aspirin may make the bleeding worse.  If you need to change your sanitary pad or tampon more than once every 2 hours, stay in bed and rest as much as possible until the bleeding stops.  Eat well-balanced meals. Eat foods high in iron. Examples are leafy green vegetables, meat, liver, eggs, and whole grain breads and cereals. Do not try to lose weight until the abnormal bleeding has stopped and your blood iron level is back to normal. SEEK MEDICAL CARE IF:   You need to change your sanitary pad or tampon more than once an hour.  You develop nausea (feeling sick to your stomach) and vomiting, dizziness, or diarrhea while you are taking your medicine.  You have any problems that may be related to the medicine you are taking. SEEK IMMEDIATE MEDICAL CARE IF:   You have a fever.  You develop chills.  You develop severe bleeding or start to pass blood clots.  You feel dizzy or faint. MAKE SURE YOU:   Understand these instructions.  Will watch your condition.  Will get help right away if you are not doing well or get worse. Document Released: 05/10/2005  Document Revised: 08/02/2011 Document Reviewed: 12/29/2007 [facility] Patient Information 2013 [place], [place]. Smoking Cessation Quitting smoking is important to your health and has many advantages. However, it is not always easy to quit since nicotine is a very addictive drug. Often times, people try 3 times or more before being able to quit. This document explains the best ways for you to prepare to quit smoking. Quitting takes hard work and a lot of effort, but you can do it. ADVANTAGES OF QUITTING SMOKING  You will live longer, feel better, and live better.  Your body will feel the impact of quitting smoking almost immediately.  Within 20 minutes, blood pressure decreases. Your pulse returns to its normal level.  After 8 hours, carbon monoxide levels in the blood return to normal. Your oxygen level increases.  After 24 hours, the chance of having a heart attack starts to decrease. Your breath, hair, and body stop smelling like smoke.  After 48 hours, damaged nerve endings begin to recover. Your sense of taste and smell improve.  After 72 hours, the body is virtually free of nicotine. Your bronchial tubes relax and breathing becomes easier.  After 2 to 12 weeks, lungs can hold more air. Exercise becomes easier and circulation improves.  The risk of having a heart attack, stroke, cancer, or lung disease is greatly reduced.  After 1 year, the risk of coronary heart disease is cut in half.  After 5 years, the risk of stroke falls to the same as a nonsmoker.  After 10 years, the risk of lung cancer is cut in half and the risk of other cancers decreases significantly.  After 15 years, the risk of coronary heart disease drops, usually to the level of a nonsmoker.  If you are pregnant, quitting smoking will improve your chances of having a healthy baby.  The people you live with, especially any children, will be healthier.  You will have extra money to spend on things other than  cigarettes. QUESTIONS TO THINK ABOUT BEFORE ATTEMPTING TO QUIT You may want to talk about your answers with your caregiver.  Why do you want to quit?  If you tried to quit in the past, what helped and what did not?  What will be the most difficult situations for you after you quit? How will you plan to handle them?  Who can help you through the tough times? Your family? Friends? A caregiver?  What pleasures do you get from smoking? What ways can you still get pleasure if you quit? Here are some questions to ask your caregiver:  How can you help me to be successful at quitting?  What medicine do you think would be best for me and how should I take it?  What should I do if I need more help?  What is smoking withdrawal like? How can I get information on withdrawal? GET READY  Set a quit date.  Change your environment by getting rid of all cigarettes, ashtrays, matches, and lighters in your home, car, or work. Do not let people smoke in your home.  Review your past attempts to quit. Think about what worked and what did not. GET SUPPORT AND ENCOURAGEMENT You have a better chance of being  successful if you have help. You can get support in many ways.  Tell your family, friends, and co-workers that you are going to quit and need their support. Ask them not to smoke around you.  Get individual, group, or telephone counseling and support. Programs are available at [employer] and health centers. Call your local health department for information about programs in your area.  Spiritual beliefs and practices may help some smokers quit.  Download a "quit meter" on your computer to keep track of quit statistics, such as how long you have gone without smoking, cigarettes not smoked, and money saved.  Get a self-help book about quitting smoking and staying off of tobacco. LEARN NEW SKILLS AND BEHAVIORS  Distract yourself from urges to smoke. Talk to someone, go for a walk, or occupy  your time with a task.  Change your normal routine. Take a different route to work. Drink tea instead of coffee. Eat breakfast in a different place.  Reduce your stress. Take a hot bath, exercise, or read a book.  Plan something enjoyable to do every day. Reward yourself for not smoking.  Explore interactive web-based programs that specialize in helping you quit. GET MEDICINE AND USE IT CORRECTLY Medicines can help you stop smoking and decrease the urge to smoke. Combining medicine with the above behavioral methods and support can greatly increase your chances of successfully quitting smoking.  Nicotine replacement therapy helps deliver nicotine to your body without the negative effects and risks of smoking. Nicotine replacement therapy includes nicotine gum, lozenges, inhalers, nasal sprays, and skin patches. Some may be available over-the-counter and others require a prescription.  Antidepressant medicine helps people abstain from smoking, but how this works is unknown. This medicine is available by prescription.  Nicotinic receptor partial agonist medicine simulates the effect of nicotine in your brain. This medicine is available by prescription. Ask your caregiver for advice about which medicines to use and how to use them based on your health history. Your caregiver will tell you what side effects to look out for if you choose to be on a medicine or therapy. Carefully read the information on the package. Do not use any other product containing nicotine while using a nicotine replacement product.  RELAPSE OR DIFFICULT SITUATIONS Most relapses occur within the first 3 months after quitting. Do not be discouraged if you start smoking again. Remember, most people try several times before finally quitting. You may have symptoms of withdrawal because your body is used to nicotine. You may crave cigarettes, be irritable, feel very hungry, cough often, get headaches, or have difficulty concentrating.  The withdrawal symptoms are only temporary. They are strongest when you first quit, but they will go away within 10 14 days. To reduce the chances of relapse, try to:  Avoid drinking alcohol. Drinking lowers your chances of successfully quitting.  Reduce the amount of caffeine you consume. Once you quit smoking, the amount of caffeine in your body increases and can give you symptoms, such as a rapid heartbeat, sweating, and anxiety.  Avoid smokers because they can make you want to smoke.  Do not let weight gain distract you. Many smokers will gain weight when they quit, usually less than 10 pounds. Eat a healthy diet and stay active. You can always lose the weight gained after you quit.  Find ways to improve your mood other than smoking. FOR MORE INFORMATION  www.smokefree.gov  Document Released: 05/04/2001 Document Revised: 11/09/2011 Document Reviewed: 08/19/2011 [employer] Patient Information 2013  [employer]. Hysterectomy Information  A hysterectomy is a procedure where your uterus is surgically removed. It will no longer be possible to have menstrual periods or to become pregnant. The tubes and ovaries can be removed (bilateral salpingo-oopherectomy) during this surgery as well.  REASONS FOR A HYSTERECTOMY  Persistent, abnormal bleeding.  Lasting (chronic) pelvic pain or infection.  The lining of the uterus (endometrium) starts growing outside the uterus (endometriosis).  The endometrium starts growing in the muscle of the uterus (adenomyosis).  The uterus falls down into the vagina (pelvic organ prolapse).  Symptomatic uterine fibroids.  Precancerous cells.  Cervical cancer or uterine cancer. TYPES OF HYSTERECTOMIES  Supracervical hysterectomy. This type removes the top part of the uterus, but not the cervix.  Total hysterectomy. This type removes the uterus and cervix.  Radical hysterectomy. This type removes the uterus, cervix, and the fibrous tissue that holds the  uterus in place in the pelvis (parametrium). WAYS A HYSTERECTOMY CAN BE PERFORMED  Abdominal hysterectomy. A large surgical cut (incision) is made in the abdomen. The uterus is removed through this incision.  Vaginal hysterectomy. An incision is made in the vagina. The uterus is removed through this incision. There are no abdominal incisions.  Conventional laparoscopic hysterectomy. A thin, lighted tube with a camera (laparoscope) is inserted into 3 or 4 small incisions in the abdomen. The uterus is cut into small pieces. The small pieces are removed through the incisions, or they are removed through the vagina.  Laparoscopic assisted vaginal hysterectomy (LAVH). Three or four small incisions are made in the abdomen. Part of the surgery is performed laparoscopically and part vaginally. The uterus is removed through the vagina.  Robot-assisted laparoscopic hysterectomy. A laparoscope is inserted into 3 or 4 small incisions in the abdomen. A computer-controlled device is used to give the surgeon a 3D image. This allows for more precise movements of surgical instruments. The uterus is cut into small pieces and removed through the incisions or removed through the vagina. RISKS OF HYSTERECTOMY   Bleeding and risk of blood transfusion. Tell your caregiver if you do not want to receive any blood products.  Blood clots in the legs or lung.  Infection.  Injury to surrounding organs.  Anesthesia problems or side effects.  Conversion to an abdominal hysterectomy. WHAT TO EXPECT AFTER A HYSTERECTOMY  You will be given pain medicine.  You will need to have someone with you for the first 3 to 5 days after you go home.  You will need to follow up with your surgeon in 2 to 4 weeks after surgery to evaluate your progress.  You may have early menopause symptoms like hot flashes, night sweats, and insomnia.  If you had a hysterectomy for a problem that was not a cancer or a condition that could lead  to cancer, then you no longer need Pap tests. However, even if you no longer need a Pap test, a regular exam is a good idea to make sure no other problems are starting. Document Released: 11/03/2000 Document Revised: 08/02/2011 Document Reviewed: 12/19/2010 [facility] Patient Information 2013 [place], [place]. Intrauterine Device Information An intrauterine device (IUD) is inserted into your uterus and prevents pregnancy. There are 2 types of IUDs available:  Copper IUD. This type of IUD is wrapped in copper wire and is placed inside the uterus. Copper makes the uterus and fallopian tubes produce a fluid that kills sperm. The copper IUD can stay in place for 10 years.  Hormone IUD. This type  of IUD contains the hormone progestin (synthetic progesterone). The hormone thickens the cervical mucus and prevents sperm from entering the uterus, and it also thins the uterine lining to prevent implantation of a fertilized egg. The hormone can weaken or kill the sperm that get into the uterus. The hormone IUD can stay in place for 5 years. Your caregiver will make sure you are a good candidate for a contraceptive IUD. Discuss with your caregiver the possible side effects. ADVANTAGES  It is highly effective, reversible, long-acting, and low maintenance.  There are no estrogen-related side effects.  An IUD can be used when breastfeeding.  It is not associated with weight gain.  It works immediately after insertion.  The copper IUD does not interfere with your female hormones.  The progesterone IUD can make heavy menstrual periods lighter.  The progesterone IUD can be used for 5 years.  The copper IUD can be used for 10 years. DISADVANTAGES  The progesterone IUD can be associated with irregular bleeding patterns.  The copper IUD can make your menstrual flow heavier and more painful.  You may experience cramping and vaginal bleeding after insertion. Document Released: 04/13/2004 Document Revised:  08/02/2011 Document Reviewed: 09/12/2010 [facility] Patient Information 2013 [place], [place]. Endometrial Ablation Endometrial ablation removes the lining of the uterus (endometrium). It is usually a same day, outpatient treatment. Ablation helps avoid major surgery (such as a hysterectomy). A hysterectomy is removal of the cervix and uterus. Endometrial ablation has less risk and complications, has a shorter recovery period and is less expensive. After endometrial ablation, most women will have little or no menstrual bleeding. You may not keep your fertility. Pregnancy is no longer likely after this procedure but if you are pre-menopausal, you still need to use a reliable method of birth control following the procedure because pregnancy can occur. REASONS TO HAVE THE PROCEDURE MAY INCLUDE:  Heavy periods.  Bleeding that is causing anemia.  Anovulatory bleeding, very irregular, bleeding.  Bleeding submucous fibroids (on the lining inside the uterus) if they are smaller than 3 centimeters. REASONS NOT TO HAVE THE PROCEDURE MAY INCLUDE:  You wish to have more children.  You have a pre-cancerous or cancerous problem. The cause of any abnormal bleeding must be diagnosed before having the procedure.  You have pain coming from the uterus.  You have a submucus fibroid larger than 3 centimeters.  You recently had a baby.  You recently had an infection in the uterus.  You have a severe retro-flexed, tipped uterus and cannot insert the instrument to do the ablation.  You had a Cesarean section or deep major surgery on the uterus.  The inner cavity of the uterus is too large for the endometrial ablation instrument. RISKS AND COMPLICATIONS   Perforation of the uterus.  Bleeding.  Infection of the uterus, bladder or vagina.  Injury to surrounding organs.  Cutting the cervix.  An air bubble to the lung (air embolus).  Pregnancy following the procedure.  Failure of the procedure to  help the problem requiring hysterectomy.  Decreased ability to diagnose cancer in the lining of the uterus. BEFORE THE PROCEDURE  The lining of the uterus must be tested to make sure there is no pre-cancerous or cancer cells present.  Medications may be given to make the lining of the uterus thinner.  Ultrasound may be used to evaluate the size and look for abnormalities of the uterus.  Future pregnancy is not desired. PROCEDURE  There are different ways to destroy the  lining of the uterus.   Resectoscope - radio frequency-alternating electric current is the most common one used.  Cryotherapy - freezing the lining of the uterus.  Heated Free Liquid - heated salt (saline) solution inserted into the uterus.  Microwave - uses high energy microwaves in the uterus.  Thermal Balloon - a catheter with a balloon tip is inserted into the uterus and filled with heated fluid. Your caregiver will talk with you about the method used in this clinic. They will also instruct you on the pros and cons of the procedure. Endometrial ablation is performed along with a procedure called operative hysteroscopy. A narrow viewing tube is inserted through the birth canal (vagina) and through the cervix into the uterus. A tiny camera attached to the viewing tube (hysteroscope) allows the uterine cavity to be shown on a TV monitor during surgery. Your uterus is filled with a harmless liquid to make the procedure easier. The lining of the uterus is then removed. The lining can also be removed with a resectoscope which allows your surgeon to cut away the lining of the uterus under direct vision. Usually, you will be able to go home within an hour after the procedure. HOME CARE INSTRUCTIONS   Do not drive for 24 hours.  No tampons, douching or intercourse for 2 weeks or until your caregiver approves.  Rest at home for 24 to 48 hours. You may then resume normal activities unless told differently by your  caregiver.  Take your temperature two times a day for 4 days, and record it.  Take any medications your caregiver has ordered, as directed.  Use some form of contraception if you are pre-menopausal and do not want to get pregnant. Bleeding after the procedure is normal. It varies from light spotting and mildly watery to bloody discharge for 4 to 6 weeks. You may also have mild cramping. Only take over-the-counter or prescription medicines for pain, discomfort, or fever as directed by your caregiver. Do not use aspirin, as this may aggravate bleeding. Frequent urination during the first 24 hours is normal. You will not know how effective your surgery is until at least 3 months after the surgery. SEEK IMMEDIATE MEDICAL CARE IF:   Bleeding is heavier than a normal menstrual cycle.  An oral temperature above 102 F (38.9 C) develops.  You have increasing cramps or pains not relieved with medication or develop belly (abdominal) pain which does not seem to be related to the same area of earlier cramping and pain.  You are light headed, weak or have fainting episodes.  You develop pain in the shoulder strap areas.  You have chest or leg pain.  You have abnormal vaginal discharge.  You have painful urination. Document Released: 03/19/2004 Document Revised: 08/02/2011 Document Reviewed: 06/17/2007 [facility] Patient Information 2013 [place], [place].

## 2012-05-08 NOTE — Assessment & Plan Note (Signed)
Endometrial biopsy.  Discussed options with the pt.  She is likely a very poor surgical candidate.  Discussed continued po medication, endometrial ablation or IUD.  Will address after endometrial biopsy returns.

## 2012-05-11 ENCOUNTER — Telehealth: Payer: Self-pay

## 2012-05-11 NOTE — Telephone Encounter (Signed)
Message copied by Faythe Casa on Thu May 11, 2012  1:17 PM ------      Message from: Reva Bores      Created: Wed May 10, 2012  4:54 PM       Endometrial biopsy is negative.

## 2012-05-11 NOTE — Telephone Encounter (Signed)
Called pt and informed pt that her endo bx results are negative.  Pt stated understanding but she still had bleeding to where she has to wear a pad.  I informed pt that she should keep her appt scheduled on 05/22/12 for results so that she can discuss other options for controlling her bleeding.  Pt stated understanding and had no further questions.

## 2012-05-12 ENCOUNTER — Other Ambulatory Visit (HOSPITAL_COMMUNITY): Payer: Self-pay | Admitting: Advanced Practice Midwife

## 2012-05-18 ENCOUNTER — Telehealth: Payer: Self-pay

## 2012-05-18 NOTE — Telephone Encounter (Signed)
Returned pt's call and discussed her concern. She stated that she is bleeding heavily now and is saturating a pad every hour and "the fibroids are coming out".  I told pt that most likely she is passing some blood clots as it is not as likely for the fibroids to come out spontaneously. I asked if she is taking the Megace as prescribed on 12/22 by Wynelle Bourgeois CNM.  Pt stated that she just received a call from the pharmacy today that the Rx is ready. I urged pt to obtain medicine today and take the first 2 tabs as soon as possible. Then she should take 2 tabs in the morning and continue daily as prescribed. Pt voiced understanding and agreed to recommendations. She will keep clinic appt as scheduled on 12/30.

## 2012-05-18 NOTE — Telephone Encounter (Signed)
Pt called and stated cab someone give me a call its concerning me it's about my bleeding.

## 2012-05-22 ENCOUNTER — Ambulatory Visit: Payer: Self-pay | Admitting: Family Medicine

## 2012-05-25 ENCOUNTER — Ambulatory Visit (HOSPITAL_COMMUNITY): Admission: RE | Admit: 2012-05-25 | Payer: Self-pay | Source: Ambulatory Visit

## 2012-05-31 ENCOUNTER — Other Ambulatory Visit (HOSPITAL_COMMUNITY): Payer: Self-pay | Admitting: Advanced Practice Midwife

## 2012-06-05 ENCOUNTER — Ambulatory Visit: Payer: Self-pay | Admitting: Obstetrics and Gynecology

## 2012-06-13 ENCOUNTER — Other Ambulatory Visit (HOSPITAL_COMMUNITY): Payer: Self-pay | Admitting: Advanced Practice Midwife

## 2012-06-21 ENCOUNTER — Ambulatory Visit: Payer: Self-pay | Admitting: Obstetrics & Gynecology

## 2012-07-06 ENCOUNTER — Ambulatory Visit: Payer: Self-pay | Admitting: Obstetrics & Gynecology

## 2012-07-10 ENCOUNTER — Other Ambulatory Visit (HOSPITAL_COMMUNITY): Payer: Self-pay | Admitting: Advanced Practice Midwife

## 2012-07-24 ENCOUNTER — Other Ambulatory Visit (HOSPITAL_COMMUNITY): Payer: Self-pay | Admitting: Advanced Practice Midwife

## 2012-07-26 ENCOUNTER — Encounter: Payer: Self-pay | Admitting: *Deleted

## 2012-07-26 ENCOUNTER — Telehealth: Payer: Self-pay | Admitting: *Deleted

## 2012-07-26 NOTE — Telephone Encounter (Signed)
Called pt @ the request of Wynelle Bourgeois CNM.  I informed pt that Hilda Lias would like her to be seen by a physician for evaluation and treatment of her abnormal bleeding. Pt agreed and stated that she does not know why she continues to bleed. She has not picked up the Rx for Megace which was given on 07/11/12 because she is waiting to get paid. Pt also reports having back pain and difficulty sleeping. She is taking ibuprofen with some success. I advised that she may try Tylenol PM as a sleep aid.  Pt voiced understanding. I transferred the call to [facility] for appt scheduling.

## 2012-07-27 ENCOUNTER — Other Ambulatory Visit: Payer: Self-pay

## 2012-07-27 MED ORDER — MEGESTROL ACETATE 20 MG PO TABS: ORAL_TABLET | ORAL | Status: DC

## 2012-07-27 NOTE — Telephone Encounter (Signed)
Called pharmacy and called in Rx for megace per Dr. Debroah Loop and pt has a f/u scheduled for 08/09/12.   Called pt and informed pt that Rx is awaiting at pharmacy.

## 2012-07-31 ENCOUNTER — Other Ambulatory Visit: Payer: Self-pay | Admitting: Obstetrics & Gynecology

## 2012-07-31 DIAGNOSIS — Z1231 Encounter for screening mammogram for malignant neoplasm of breast: Secondary | ICD-10-CM

## 2012-08-02 ENCOUNTER — Ambulatory Visit (HOSPITAL_COMMUNITY): Payer: Self-pay | Attending: Obstetrics & Gynecology

## 2012-08-08 ENCOUNTER — Other Ambulatory Visit: Payer: Self-pay | Admitting: Obstetrics & Gynecology

## 2012-08-08 ENCOUNTER — Other Ambulatory Visit (HOSPITAL_COMMUNITY): Payer: Self-pay | Admitting: Advanced Practice Midwife

## 2012-08-08 NOTE — Telephone Encounter (Signed)
Pt left message requesting refill of Megestrol. I returned her call and left message that she has office appt tomorrow @ 4pm for the doctor to evaluate whether or not she should continue this medication. Additional refills will not be given at this time prior to her evaluation. Please keep her appt as scheduled.

## 2012-08-09 ENCOUNTER — Ambulatory Visit (INDEPENDENT_AMBULATORY_CARE_PROVIDER_SITE_OTHER): Payer: Self-pay | Admitting: Obstetrics & Gynecology

## 2012-08-09 ENCOUNTER — Encounter: Payer: Self-pay | Admitting: Obstetrics & Gynecology

## 2012-08-09 VITALS — BP 180/115 | HR 98 | Temp 98.0°F | Resp 20 | Ht 68.0 in | Wt 204.1 lb

## 2012-08-09 DIAGNOSIS — D219 Benign neoplasm of connective and other soft tissue, unspecified: Secondary | ICD-10-CM | POA: Insufficient documentation

## 2012-08-09 DIAGNOSIS — D259 Leiomyoma of uterus, unspecified: Secondary | ICD-10-CM

## 2012-08-09 MED ORDER — INFLUENZA VIRUS VACC SPLIT PF IM SUSP
0.5000 mL | INTRAMUSCULAR | Status: AC | PRN
  Administered 2012-08-09: 0.5 mL via INTRAMUSCULAR

## 2012-08-09 MED ORDER — MEGESTROL ACETATE 20 MG PO TABS: ORAL_TABLET | ORAL | Status: DC

## 2012-08-09 NOTE — Progress Notes (Signed)
Pt states she has been bleeding for over 1 month- last took Megace 2 days ago- wants refill. Pt has not taken BP meds, spironolactone or pravachol x1 month.  Pt wants testing for all STI's today.

## 2012-08-09 NOTE — Patient Instructions (Signed)
Fibroids Fibroids are lumps (tumors) that can occur any place in a woman's body. These lumps are not cancerous. Fibroids vary in size, weight, and where they grow. HOME CARE  Do not take aspirin.  Write down the number of pads or tampons you use during your period. Tell your doctor. This can help determine the best treatment for you. GET HELP RIGHT AWAY IF:  You have pain in your lower belly (abdomen) that is not helped with medicine.  You have cramps that are not helped with medicine.  You have more bleeding between or during your period.  You feel lightheaded or pass out (faint).  Your lower belly pain gets worse. MAKE SURE YOU:  Understand these instructions.  Will watch your condition.  Will get help right away if you are not doing well or get worse. Document Released: 06/12/2010 Document Revised: 08/02/2011 Document Reviewed: 06/12/2010 Exit[employer] Patient Information 2013 [employer].  

## 2012-08-09 NOTE — Progress Notes (Signed)
Subjective:    [name], female    DOB: 1966-03-09, 47 y.o.   MRN: [id number]  HPI No LMP recorded. [id number] Return to f/u DUB which was treated with Megace. Bleeding did decrease with Megace but she ran out and needs refill. Normal EMBx 04/2012.   Current Outpatient Prescriptions on File Prior to Visit  Medication Sig Dispense Refill  . aspirin 325 MG tablet Take 162 mg by mouth daily.      . fish oil-omega-3 fatty acids 1000 MG capsule Take 1 capsule (1 g total) by mouth daily.  30 capsule  4  . ibuprofen (ADVIL,MOTRIN) 800 MG tablet Take 800 mg by mouth every 8 (eight) hours as needed.      . carvedilol (COREG) 3.125 MG tablet Take 1 tablet (3.125 mg total) by mouth 2 (two) times daily with a meal.  60 tablet  12  . ferrous sulfate 325 (65 FE) MG tablet Take 1 tablet (325 mg total) by mouth 2 (two) times daily.  60 tablet  4  . furosemide (LASIX) 20 MG tablet Take 1 tablet (20 mg total) by mouth daily.  60 tablet  5  . hydrALAZINE (APRESOLINE) 25 MG tablet Take 1 tablet (25 mg total) by mouth 2 (two) times daily.  60 tablet  11  . isosorbide mononitrate (IMDUR) 30 MG 24 hr tablet Take 30 mg by mouth daily.      . potassium chloride SA (KLOR-CON M20) 20 MEQ tablet Take 1 tablet (20 mEq total) by mouth daily.  30 tablet  5  . pravastatin (PRAVACHOL) 20 MG tablet Take 1 tablet (20 mg total) by mouth daily.  30 tablet  4  . spironolactone (ALDACTONE) 25 MG tablet Take 1 tablet (25 mg total) by mouth daily.  30 tablet  6   No current facility-administered medications on file prior to visit.   Past Medical History  Diagnosis Date  . Chronic systolic heart failure   . Stroke   . Hypertension   . MITRAL REGURGITATION 10/15/2008    Qualifier: Diagnosis of  By: Denyse Amass CMA, Okey Regal    . DYSLIPIDEMIA 06/08/2010    Qualifier: Diagnosis of  By: Huntley Dec, Scott    . Chelsea Branch 06/08/2010    Qualifier: Diagnosis of  By: Huntley Dec, Scott    . Alopecia   .  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 Noncompliance   . Tobacco abuse   . CHF (congestive heart failure)   . Fibroids   . Abnormal uterine bleeding      Review of Systems  Respiratory: Positive for shortness of breath.   Cardiovascular: Negative for chest pain.  Genitourinary: Positive for vaginal bleeding and menstrual problem. Negative for vaginal discharge.       Objective:   Physical Exam  Constitutional: No distress.  Pulmonary/Chest: Effort normal. No respiratory distress.  Abdominal: Soft. She exhibits no mass. There is no tenderness.  Genitourinary:  Vaginal bleeding, no cervical lesion,uterus 8-10 week size  Skin: Skin is warm and dry. No pallor.  Psychiatric: She has a normal mood and affect. Her behavior is normal.    *RADIOLOGY REPORT*  Clinical Data: Vaginal bleeding and pelvic cramping.  TRANSABDOMINAL AND TRANSVAGINAL ULTRASOUND OF PELVIS  Technique: Both transabdominal and transvaginal ultrasound  examinations of the pelvis were performed. Transabdominal technique  was performed for global imaging of the pelvis including uterus,  ovaries, adnexal regions, and pelvic cul-de-sac.  It was necessary to proceed with endovaginal exam following the  transabdominal exam to visualize the uterus in greater detail.  Comparison: None  Findings:  Uterus: Diffusely enlarged, measuring 15.0 x 7.6 x 10.0 cm. There  is fluid filling the endometrial canal at the level of the fundus,  with a large 8.6 x 5.8 x 4.6 cm region of heterogeneous  echogenicity filling the lower endometrial canal and extending to  the cervix. No associated blood flow is seen; this could reflect a  large clot, though the fluid filling the endometrial canal at the  level of fundus suggests some degree of obstruction.  Two fibroids are identified, both myometrial in nature. The  anterior fibroid measures 3.2 x 3.0 x 2.3 cm, while the  posterior  fibroid measures 3.3 x 3.0 x 2.6 cm.  Endometrium: Filled with heterogeneous echogenic material,  measuring 4.7 cm in thickness.  Right ovary: Normal appearance/no adnexal mass; measures 4.6 x 3.1  x 3.2 cm.  Left ovary: Normal appearance/no suspicious adnexal mass. Measures  5.6 x 5.2 x 3.9 cm. A 3.2 cm cyst is likely physiologic in nature.  Other findings: No free fluid seen within the pelvic cul-de-sac.  IMPRESSION:  1. Large 8.6 x 5.8 x 4.6 cm region of heterogeneous echogenicity  fills the lower endometrial canal and extends to the cervix; no  associated blood flow is seen. This could reflect a very large  clot, though it appears to be causing some degree of obstruction  given simple fluid filling the endometrial canal at the level of  the fundus. Suggest clinical correlation for a large clot; if the  patient's symptoms do not improve, further imaging evaluation might  be considered.  2. Two fibroids noted within the uterus.  Original Report Authenticated By: Tonia Ghent, M.D.       Assessment & Plan:  DUB, fibroid uterus, on Megace, with HTN and h/o cardiomyopathy Repeat US, RTC 2 weeks   08/09/2012 Chelsea Branch

## 2012-08-14 ENCOUNTER — Ambulatory Visit (HOSPITAL_COMMUNITY): Payer: Self-pay

## 2012-08-16 ENCOUNTER — Other Ambulatory Visit: Payer: Self-pay | Admitting: Obstetrics & Gynecology

## 2012-08-16 ENCOUNTER — Telehealth: Payer: Self-pay

## 2012-08-16 ENCOUNTER — Ambulatory Visit (HOSPITAL_COMMUNITY)
Admission: RE | Admit: 2012-08-16 | Discharge: 2012-08-16 | Disposition: A | Discharge status: Home / Self Care | Payer: Self-pay | Source: Ambulatory Visit | Attending: Obstetrics & Gynecology | Admitting: Obstetrics & Gynecology

## 2012-08-16 DIAGNOSIS — D219 Benign neoplasm of connective and other soft tissue, unspecified: Secondary | ICD-10-CM

## 2012-08-16 DIAGNOSIS — D250 Submucous leiomyoma of uterus: Secondary | ICD-10-CM | POA: Insufficient documentation

## 2012-08-16 DIAGNOSIS — N949 Unspecified condition associated with female genital organs and menstrual cycle: Secondary | ICD-10-CM | POA: Insufficient documentation

## 2012-08-16 DIAGNOSIS — N938 Other specified abnormal uterine and vaginal bleeding: Secondary | ICD-10-CM | POA: Insufficient documentation

## 2012-08-16 DIAGNOSIS — D251 Intramural leiomyoma of uterus: Secondary | ICD-10-CM | POA: Insufficient documentation

## 2012-08-16 MED ORDER — CEPHALEXIN 500 MG PO CAPS: 500.0000 mg | ORAL_CAPSULE | Freq: Four times a day (QID) | ORAL | Status: DC

## 2012-08-16 NOTE — Telephone Encounter (Signed)
Patient called for results.  She had cultures done on 08/09/12.

## 2012-08-16 NOTE — Telephone Encounter (Signed)
Called pt and informed her of her test results and that she has a UTI.  Per Wynelle Bourgeois, due to pt not having insurance she prescribed Keflex 500mg  po qid x 7days.  Verified pharmacy with pt and pt stated understanding with no further questions.

## 2012-08-17 ENCOUNTER — Telehealth: Payer: Self-pay | Admitting: *Deleted

## 2012-08-17 NOTE — Telephone Encounter (Signed)
Chelsea Branch called and left a message she wants to know what to take the medicine for. [name] and reviewed with her that she is taking Keflex for a uti. She wants to know how she got the uti. Reviewed with her some causes for uti and preventative measures and stressed she should take her medicine until it is all gone and if she is still having symptoms to call us.  Also states she had ultrasound yesterday and wants to know what it was for.- reviewed with her why ultrasound was done and that doctor has not yet reviewed results but will go over results with her at her follow up and determine with her plan of care and treatment based on results and how she is doing.  Also informed her if results indicated needed treatment sooner doctor would have [place] call her. Patient voices understanding.

## 2012-08-23 ENCOUNTER — Telehealth: Payer: Self-pay | Admitting: General Practice

## 2012-08-23 NOTE — Telephone Encounter (Signed)
Patient called and left message stating she would like a call back to discuss test results and herself. Called patient back and patient asked what the plan for her care was because she is still bleeding a lot and cramping. I told the patient she had a follow up appt on 4/21 to discuss what has or hasn't worked for her since her last appt. Patient verbalized understanding and asked if there was anything sooner. I told her no but the best thing to do is to call us in the mornings and see if there is any cancellations for the day so she can be seen sooner. Patient verbalized understanding and had no further questions

## 2012-08-30 ENCOUNTER — Telehealth: Payer: Self-pay | Admitting: Internal Medicine

## 2012-08-30 NOTE — Telephone Encounter (Signed)
Pt states was seen in November last year by Lawson Fiscal gerhardt,; at that time pt C/O of having a rush in her head when taken Imdur 30 mg 1/2 tablet at bedtime and got up in the morning with a  Headache. NP recommended for pt to take 1/2 tablet of Imdur 30 mg. Pt states she was taken 1/2 tablet already, So pt did not continue this medication. Pt states she feel blooded like she has a lots of fluids in her system, And  Is having  problems breathing during the  night. Pt states is taken the prescribed lasix and potasium, but pt  is not taken Spironaldactone 25 mg once a day nor the Imdur medication. Pt was encourage to take the prescribed medication, that will help improve her breathing. Pt also states has been having lots of vaginal bleeding , and her GYN recommended for pt to stop taken the Aspirin 162 mg daily. Pt would like to know if she can stop the aspirin. Pt has an appointment with Dr. Graciela Husbands on April 25 th 2014 at 11:30 AM.

## 2012-08-30 NOTE — Telephone Encounter (Signed)
New Prob   Pt states she has been bleeding frequently, and believes isosorbide has been giving her a rush to her head. Concerned and would like to speak to nurse.

## 2012-09-06 NOTE — Telephone Encounter (Signed)
Unable to reach pt or leave a message, discussed with dr Graciela Husbands. Pt may stop aspirin.

## 2012-09-07 ENCOUNTER — Telehealth: Payer: Self-pay | Admitting: *Deleted

## 2012-09-07 NOTE — Telephone Encounter (Signed)
Patient has visit on Monday 4/21. It doesn't appear that we prescribed the med for bv in the past. Will address this at her visit.

## 2012-09-07 NOTE — Telephone Encounter (Signed)
Patient left a message requesting a refill on the medicine we gave her for the bacterial infection.

## 2012-09-11 ENCOUNTER — Encounter: Payer: Self-pay | Admitting: Obstetrics & Gynecology

## 2012-09-11 ENCOUNTER — Ambulatory Visit (INDEPENDENT_AMBULATORY_CARE_PROVIDER_SITE_OTHER): Payer: Self-pay | Admitting: Obstetrics & Gynecology

## 2012-09-11 VITALS — BP 151/97 | HR 99 | Temp 98.7°F | Ht 68.0 in | Wt 199.2 lb

## 2012-09-11 DIAGNOSIS — D219 Benign neoplasm of connective and other soft tissue, unspecified: Secondary | ICD-10-CM

## 2012-09-11 DIAGNOSIS — D259 Leiomyoma of uterus, unspecified: Secondary | ICD-10-CM

## 2012-09-11 MED ORDER — IBUPROFEN 800 MG PO TABS: 800.0000 mg | ORAL_TABLET | Freq: Three times a day (TID) | ORAL | Status: DC | PRN

## 2012-09-11 NOTE — Progress Notes (Signed)
[name], female   DOB: 10/16/1965, 47 y.o.   MRN: [id number]  Chief Complaint  Patient presents with  . Follow-up    fibroids/bleeding    HPI Chelsea Branch is a 47 y.o. female.  No LMP recorded. [id number] She continues to bleed despite megace, cramp. Wants to have hysterectomy  HPI  Past Medical History  Diagnosis Date  . Chronic systolic heart failure   . Stroke   . Hypertension   . MITRAL REGURGITATION 10/15/2008    Qualifier: Diagnosis of  By: Denyse Amass CMA, Okey Regal    . DYSLIPIDEMIA 06/08/2010    Qualifier: Diagnosis of  By: Huntley Dec, Scott    . 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 Alopecia   . 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 Noncompliance   . Tobacco abuse   . CHF (congestive heart failure)   . Fibroids   . Abnormal uterine bleeding     Past Surgical History  Procedure Laterality Date  . Caesarian    . Heart loop recorder    . Cesarean section  1992  1994  . Tubal ligation  1994    Family History  Problem Relation Age of Onset  . Other Neg Hx   . Cancer Maternal Grandmother     uterine  . Hypertension Sister     Social History History  Substance Use Topics  . Smoking status: Current Every Day Smoker -- 0.50 packs/day    Types: Cigarettes  . Smokeless tobacco: Not on file  . Alcohol Use: No    Allergies  Allergen Reactions  . Ace Inhibitors     REACTION: Cough    Current Outpatient Prescriptions  Medication Sig Dispense Refill  . carvedilol (COREG) 3.125 MG tablet Take 1 tablet (3.125 mg total) by mouth 2 (two) times daily with a meal.  60 tablet  12  . ferrous sulfate 325 (65 FE) MG tablet Take 1 tablet (325 mg total) by mouth 2 (two) times daily.  60 tablet  4  . fish oil-omega-3 fatty acids 1000 MG capsule Take 1 capsule (1 g total) by mouth daily.  30 capsule  4  . furosemide (LASIX) 20 MG tablet  Take 1 tablet (20 mg total) by mouth daily.  60 tablet  5  . hydrALAZINE (APRESOLINE) 25 MG tablet Take 1 tablet (25 mg total) by mouth 2 (two) times daily.  60 tablet  11  . ibuprofen (ADVIL,MOTRIN) 800 MG tablet Take 800 mg by mouth every 8 (eight) hours as needed.      . isosorbide mononitrate (IMDUR) 30 MG 24 hr tablet Take 30 mg by mouth daily.      . megestrol (MEGACE) 20 MG tablet TAKE TWO TABLETS BY MOUTH EVERY DAY  30 tablet  6  . potassium chloride SA (KLOR-CON M20) 20 MEQ tablet Take 1 tablet (20 mEq total) by mouth daily.  30 tablet  5  . pravastatin (PRAVACHOL) 20 MG tablet Take 1 tablet (20 mg total) by mouth daily.  30 tablet  4  . spironolactone (ALDACTONE) 25 MG tablet Take 1 tablet (25 mg total) by mouth daily.  30 tablet  6  . aspirin 325 MG tablet Take 162 mg by mouth daily.      . cephALEXin (KEFLEX) 500 MG capsule Take 1 capsule (500 mg total) by mouth 4 (four) times daily.  28 capsule  0  . ibuprofen (ADVIL,MOTRIN) 800 MG tablet Take 1 tablet (800 mg total) by mouth every 8 (eight) hours as needed for pain.  60 tablet  1   No current facility-administered medications for this visit.    Review of Systems Review of Systems  Constitutional: Positive for fatigue. Negative for fever.  Gastrointestinal: Positive for abdominal pain and abdominal distention.  Genitourinary: Positive for vaginal bleeding, menstrual problem and pelvic pain.    Blood pressure 151/97, pulse 99, temperature 98.7 F (37.1 C), temperature source Oral, height 5\' 8"  (1.727 m), weight 199 lb 3.2 oz (90.357 kg).  Physical Exam Physical Exam  Constitutional: She appears well-developed. No distress.  Pulmonary/Chest: Effort normal. No respiratory distress.  Abdominal: She exhibits mass (suprapubic). She exhibits no distension. There is no tenderness.  Skin: She is not diaphoretic.  Psychiatric:  Mildly anxious   CBC    Component Value Date/Time   WBC 9.3 04/19/2012 2303   RBC 4.32 04/19/2012  2303   HGB 11.7* 04/19/2012 2303   HCT 35.0* 04/19/2012 2303   PLT 272 04/19/2012 2303   MCV 81.0 04/19/2012 2303   MCH 27.1 04/19/2012 2303   MCHC 33.4 04/19/2012 2303   RDW 14.7 04/19/2012 2303   LYMPHSABS 2.3 04/19/2012 2303   MONOABS 0.5 04/19/2012 2303   EOSABS 0.1 04/19/2012 2303   BASOSABS 0.0 04/19/2012 2303      Data Reviewed *RADIOLOGY REPORT*  Clinical Data: Follow-up fibroids and bleeding. LMP 07/26/2012  TRANSABDOMINAL AND TRANSVAGINAL ULTRASOUND OF PELVIS  Technique: Both transabdominal and transvaginal ultrasound  examinations of the pelvis were performed. Transabdominal technique  was performed for global imaging of the pelvis including uterus,  ovaries, adnexal regions, and pelvic cul-de-sac.  It was necessary to proceed with endovaginal exam following the  transabdominal exam to visualize the uterus and ovaries.  Comparison: Pelvic ultrasound 04/19/2012  Findings:  Uterus: The uterus is enlarged, measuring 14.3 x 9.8 x 9.5 cm and  contains multiple heterogeneous rounded masses with associated  shadowing compatible with fibroids. In the lower uterine segment  is a submucosal fibroid that measures 1.9 x 2.3 x 2.5 cm. In the  left uterine fundus is a 4.5 x 3.4 x 3.4 cm intramural fibroid. In  the right posterior uterine body is an intramural 2.4 x 2.6 x 2.5  cm fibroid. In the left lower uterine segment is an intramural 2.2  x 2.4 x 2.2 cm fibroid.  Endometrium: The endometrium is poorly visualized due to the  multiple fibroids. There is suggestion of heterogeneous  endometrial thickening at the level of the lower uterine segment,  measuring up to 2.0 cm in thickness. Endometrium at the level of  the uterine fundus is not well visualized, but on image number 18  is estimated to be within normal limits at 8 mm.  Right ovary: Normal appearance/no adnexal mass. Measures 2.6 x  0.9 x 1.7 cm.  Left ovary: Normal appearance/no adnexal mass. Measures 2.5 x 1.6  x  2.4 cm.  Other findings: No free fluid  IMPRESSION:  1. Enlarged fibroid uterus. At least one submucosal fibroid is  seen in the lower uterine segment. The remainder the visualized  fibroids are intramural in position.  2. The endometrium is poorly visualized due to the presence of the  fibroids. The endometrial thickness does appear prominent at the  level of the lower uterine segment, where it measures approximately  2.0 cm.  3. Normal ovaries.  Original Report Authenticated By: Darl Pikes  Mayford Knife, M.D.   Assessment    Symptomatic fibroid uterus, bleeding, pain, unresponsive to progestin     Plan    Offered TAH/BSO. The procedure and risks of anesthesia, bleeding, transfusion, visceral organ damage, infection were discussed. She has applied for medicaid.  Will schedule a pre-op visit.          ARNOLD,JAMES 09/11/2012, 5:07 PM

## 2012-09-11 NOTE — Patient Instructions (Signed)
Hysterectomy Information   A hysterectomy is a procedure where your uterus is surgically removed. It will no longer be possible to have menstrual periods or to become pregnant. The tubes and ovaries can be removed (bilateral salpingo-oopherectomy) during this surgery as well.    REASONS FOR A HYSTERECTOMY  · Persistent, abnormal bleeding.  · Lasting (chronic) pelvic pain or infection.  · The lining of the uterus (endometrium) starts growing outside the uterus (endometriosis).  · The endometrium starts growing in the muscle of the uterus (adenomyosis).  · The uterus falls down into the vagina (pelvic organ prolapse).  · Symptomatic uterine fibroids.  · Precancerous cells.  · Cervical cancer or uterine cancer.  TYPES OF HYSTERECTOMIES  · Supracervical hysterectomy. This type removes the top part of the uterus, but not the cervix.  · Total hysterectomy. This type removes the uterus and cervix.  · Radical hysterectomy. This type removes the uterus, cervix, and the fibrous tissue that holds the uterus in place in the pelvis (parametrium).  WAYS A HYSTERECTOMY CAN BE PERFORMED  · Abdominal hysterectomy. A large surgical cut (incision) is made in the abdomen. The uterus is removed through this incision.  · Vaginal hysterectomy. An incision is made in the vagina. The uterus is removed through this incision. There are no abdominal incisions.  · Conventional laparoscopic hysterectomy. A thin, lighted tube with a camera (laparoscope) is inserted into 3 or 4 small incisions in the abdomen. The uterus is cut into small pieces. The small pieces are removed through the incisions, or they are removed through the vagina.  · Laparoscopic assisted vaginal hysterectomy (LAVH). Three or four small incisions are made in the abdomen. Part of the surgery is performed laparoscopically and part vaginally. The uterus is removed through the vagina.  · Robot-assisted laparoscopic hysterectomy. A laparoscope is inserted into 3 or 4 small  incisions in the abdomen. A computer-controlled device is used to give the surgeon a 3D image. This allows for more precise movements of surgical instruments. The uterus is cut into small pieces and removed through the incisions or removed through the vagina.  RISKS OF HYSTERECTOMY    · Bleeding and risk of blood transfusion. Tell your caregiver if you do not want to receive any blood products.  · Blood clots in the legs or lung.  · Infection.  · Injury to surrounding organs.  · Anesthesia problems or side effects.  · Conversion to an abdominal hysterectomy.  WHAT TO EXPECT AFTER A HYSTERECTOMY  · You will be given pain medicine.  · You will need to have someone with you for the first 3 to 5 days after you go home.  · You will need to follow up with your surgeon in 2 to 4 weeks after surgery to evaluate your progress.  · You may have early menopause symptoms like hot flashes, night sweats, and insomnia.  · If you had a hysterectomy for a problem that was not a cancer or a condition that could lead to cancer, then you no longer need Pap tests. However, even if you no longer need a Pap test, a regular exam is a good idea to make sure no other problems are starting.  Document Released: 11/03/2000 Document Revised: 08/02/2011 Document Reviewed: 12/19/2010  [employer]® Patient Information ©2013 [employer].

## 2012-09-13 NOTE — Telephone Encounter (Signed)
Spoke with pt, aware okay to stop aspirin. Also cautioned pt about the use of ibuprofen and bleeding.

## 2012-09-14 ENCOUNTER — Encounter: Payer: Self-pay | Admitting: Obstetrics & Gynecology

## 2012-09-15 ENCOUNTER — Ambulatory Visit: Payer: Self-pay | Admitting: Internal Medicine

## 2012-09-19 ENCOUNTER — Encounter: Payer: Self-pay | Admitting: Internal Medicine

## 2012-09-27 ENCOUNTER — Encounter (HOSPITAL_COMMUNITY): Payer: Self-pay | Admitting: *Deleted

## 2012-09-27 ENCOUNTER — Inpatient Hospital Stay (HOSPITAL_COMMUNITY)
Admission: AD | Admit: 2012-09-27 | Discharge: 2012-09-27 | Disposition: A | Discharge status: Home / Self Care | Payer: Self-pay | Source: Ambulatory Visit | Attending: Obstetrics & Gynecology | Admitting: Obstetrics & Gynecology

## 2012-09-27 DIAGNOSIS — N949 Unspecified condition associated with female genital organs and menstrual cycle: Secondary | ICD-10-CM | POA: Insufficient documentation

## 2012-09-27 DIAGNOSIS — N939 Abnormal uterine and vaginal bleeding, unspecified: Secondary | ICD-10-CM

## 2012-09-27 DIAGNOSIS — D219 Benign neoplasm of connective and other soft tissue, unspecified: Secondary | ICD-10-CM

## 2012-09-27 DIAGNOSIS — R109 Unspecified abdominal pain: Secondary | ICD-10-CM

## 2012-09-27 DIAGNOSIS — D259 Leiomyoma of uterus, unspecified: Secondary | ICD-10-CM | POA: Insufficient documentation

## 2012-09-27 DIAGNOSIS — N938 Other specified abnormal uterine and vaginal bleeding: Secondary | ICD-10-CM | POA: Insufficient documentation

## 2012-09-27 LAB — HEMOGLOBIN AND HEMATOCRIT, BLOOD: HCT: 36 % (ref 36.0–46.0)

## 2012-09-27 MED ORDER — OXYCODONE-ACETAMINOPHEN 5-325 MG PO TABS: 1.0000 | ORAL_TABLET | Freq: Four times a day (QID) | ORAL | Status: DC | PRN

## 2012-09-27 MED ORDER — KETOROLAC TROMETHAMINE 60 MG/2ML IM SOLN
60.0000 mg | Freq: Once | INTRAMUSCULAR | Status: AC
  Administered 2012-09-27: 60 mg via INTRAMUSCULAR
  Filled 2012-09-27: qty 2

## 2012-09-27 NOTE — MAU Provider Note (Signed)
History     CSN: [id number]  Arrival date and time: 09/27/12 1423   First Provider Initiated Contact with Patient 09/27/12 1541      Chief Complaint  Patient presents with  . Vaginal Bleeding  . Abdominal Pain   HPI Chelsea Branch is 47 y.o. [phone] presents with heavy vaginal bleeding with clots.  States she came in because the pain is unbearable.  It began at 10:30 today.  The pain is in "my uterus and in my stomach, it smells bad and I always am wearing a pad". Hx fibroids.  Patient of Dr. Olivia Mackie.  She has surgery planned 11/20/12.   She has not taken anything for pain today.   She appears uncomfortable.   She had Rx for Ibuprofen but her heart doctor doesn't want her to take them daily.      Past Medical History  Diagnosis Date  . Chronic systolic heart failure   . Stroke   . Hypertension   . MITRAL REGURGITATION 10/15/2008    Qualifier: Diagnosis of  By: Denyse Amass CMA, Okey Regal    . DYSLIPIDEMIA 06/08/2010    Qualifier: Diagnosis of  By: Huntley Dec, Scott    . 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 Alopecia   . 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 Noncompliance   . Tobacco abuse   . CHF (congestive heart failure)   . Fibroids   . Abnormal uterine bleeding     Past Surgical History  Procedure Laterality Date  . Caesarian    . Heart loop recorder    . Cesarean section  1992  1994  . Tubal ligation  1994    Family History  Problem Relation Age of Onset  . Other Neg Hx   . Cancer Maternal Grandmother     uterine  . Hypertension Sister     History  Substance Use Topics  . Smoking status: Current Every Day Smoker -- 0.25 packs/day    Types: Cigarettes  . Smokeless tobacco: Never Used  . Alcohol Use: No    Allergies:  Allergies  Allergen Reactions  . Ace Inhibitors     REACTION: Cough    Prescriptions prior to admission  Medication  Sig Dispense Refill  . carvedilol (COREG) 3.125 MG tablet Take 1 tablet (3.125 mg total) by mouth 2 (two) times daily with a meal.  60 tablet  12  . ferrous sulfate 325 (65 FE) MG tablet Take 1 tablet (325 mg total) by mouth 2 (two) times daily.  60 tablet  4  . fish oil-omega-3 fatty acids 1000 MG capsule Take 1 capsule (1 g total) by mouth daily.  30 capsule  4  . furosemide (LASIX) 20 MG tablet Take 1 tablet (20 mg total) by mouth daily.  60 tablet  5  . hydrALAZINE (APRESOLINE) 25 MG tablet Take 1 tablet (25 mg total) by mouth 2 (two) times daily.  60 tablet  11  . ibuprofen (ADVIL,MOTRIN) 800 MG tablet Take 800 mg by mouth every 8 (eight) hours as needed.      Marland Kitchen ibuprofen (ADVIL,MOTRIN) 800 MG tablet Take 1 tablet (800 mg total) by mouth every 8 (eight) hours as needed for pain.  60 tablet  1  . megestrol (MEGACE) 20 MG tablet TAKE TWO TABLETS BY MOUTH EVERY DAY  30 tablet  6  . potassium chloride SA (KLOR-CON  M20) 20 MEQ tablet Take 1 tablet (20 mEq total) by mouth daily.  30 tablet  5  . pravastatin (PRAVACHOL) 20 MG tablet Take 1 tablet (20 mg total) by mouth daily.  30 tablet  4  . spironolactone (ALDACTONE) 25 MG tablet Take 1 tablet (25 mg total) by mouth daily.  30 tablet  6  . [DISCONTINUED] isosorbide mononitrate (IMDUR) 30 MG 24 hr tablet Take 30 mg by mouth daily.        Review of Systems  Constitutional: Negative for fever and chills.  Gastrointestinal: Positive for abdominal pain. Negative for nausea and vomiting.  Genitourinary:       Heavy vaginal bleeding with clots.  Known fibroids  Neurological: Negative for headaches.   Physical Exam   Blood pressure 141/88, pulse 94, temperature 98.5 F (36.9 C), temperature source Oral, resp. rate 20, height 5\' 5"  (1.651 m), weight 199 lb 3.2 oz (90.357 kg), SpO2 98.00%.  Physical Exam  Constitutional: She is oriented to person, place, and time. She appears well-developed and well-nourished. No distress.  HENT:  Head:  Normocephalic.  Neck: Normal range of motion.  Cardiovascular: Normal rate.   Respiratory: Effort normal.  GI: Soft. She exhibits no distension and no mass. There is tenderness. There is no rebound and no guarding.  Genitourinary: There is no rash, tenderness or lesion on the right labia. There is no rash, tenderness or lesion on the left labia. Uterus is enlarged and tender. Right adnexum displays no tenderness. Left adnexum displays no tenderness. There is bleeding (small amount of vaginal bleeding with a few small clots) around the vagina. No tenderness around the vagina.  Neurological: She is alert and oriented to person, place, and time.  Skin: Skin is warm and dry.  Psychiatric: She has a normal mood and affect. Her behavior is normal.    HBG at 11/13 visit was 11.7   Results for orders placed during the hospital encounter of 09/27/12 (from the past 24 hour(s))  HEMOGLOBIN AND HEMATOCRIT, BLOOD     Status: Abnormal   Collection Time    09/27/12  5:05 PM      Result Value Range   Hemoglobin 11.6 (*) 12.0 - 15.0 g/dL   HCT 96.0  45.4 - 09.8 %   MAU Course  Procedures  MDM Reported MSE to Dr. Debroah Loop.  May give Toradol 60mg  IM for pain. 17:00 Patient is much more comfortable after Toradol given.  Discussed pain management until surgery end of June.  Will Rx Percocet 5/325mg  1 tab q6hrs prn pain. #20.    Assessment and Plan  A:  Abnormal vaginal bleeding      Known fibroid      Hgb is stable  P:  Rx for Percocet 5/325mg  po q6hrs prn pain       Continue with plans for hysterectomy as scheduled with Dr. Debroah Loop.  KEY,EVE M 09/27/2012, 3:42 PM

## 2012-09-27 NOTE — MAU Note (Signed)
Patient state she has a history of fibroids and is scheduled for hysterectomy on 6-30. States she has been bleeding for 4 months and has had extreme abdominal cramping since 1030.

## 2012-10-13 ENCOUNTER — Inpatient Hospital Stay (HOSPITAL_COMMUNITY)
Admission: AD | Admit: 2012-10-13 | Discharge: 2012-10-13 | Disposition: A | Discharge status: Home / Self Care | Payer: Self-pay | Source: Ambulatory Visit | Attending: Obstetrics and Gynecology | Admitting: Obstetrics and Gynecology

## 2012-10-13 DIAGNOSIS — N938 Other specified abnormal uterine and vaginal bleeding: Secondary | ICD-10-CM | POA: Insufficient documentation

## 2012-10-13 DIAGNOSIS — D259 Leiomyoma of uterus, unspecified: Secondary | ICD-10-CM | POA: Insufficient documentation

## 2012-10-13 DIAGNOSIS — D649 Anemia, unspecified: Secondary | ICD-10-CM | POA: Insufficient documentation

## 2012-10-13 DIAGNOSIS — D219 Benign neoplasm of connective and other soft tissue, unspecified: Secondary | ICD-10-CM

## 2012-10-13 DIAGNOSIS — N920 Excessive and frequent menstruation with regular cycle: Secondary | ICD-10-CM

## 2012-10-13 DIAGNOSIS — N949 Unspecified condition associated with female genital organs and menstrual cycle: Secondary | ICD-10-CM | POA: Insufficient documentation

## 2012-10-13 LAB — CBC
HCT: 30.3 % — ABNORMAL LOW (ref 36.0–46.0)
Hemoglobin: 9.5 g/dL — ABNORMAL LOW (ref 12.0–15.0)
RBC: 4 MIL/uL (ref 3.87–5.11)
WBC: 7.8 10*3/uL (ref 4.0–10.5)

## 2012-10-13 MED ORDER — KETOROLAC TROMETHAMINE 60 MG/2ML IM SOLN
60.0000 mg | Freq: Once | INTRAMUSCULAR | Status: AC
  Administered 2012-10-13: 60 mg via INTRAMUSCULAR
  Filled 2012-10-13: qty 2

## 2012-10-13 MED ORDER — MEDROXYPROGESTERONE ACETATE 10 MG PO TABS: 10.0000 mg | ORAL_TABLET | Freq: Every day | ORAL | Status: DC

## 2012-10-13 MED ORDER — OXYCODONE-ACETAMINOPHEN 5-325 MG PO TABS: 1.0000 | ORAL_TABLET | Freq: Four times a day (QID) | ORAL | Status: DC | PRN

## 2012-10-13 NOTE — MAU Provider Note (Signed)
History     CSN: [id number]  Arrival date and time: 10/13/12 1530   First Provider Initiated Contact with Patient 10/13/12 1603      Chief Complaint  Patient presents with  . Vaginal Bleeding  . Abdominal Pain   HPI Ms. Chelsea Branch is a 47 y.o. [phone] who presents to MAU today for vaginal bleeding and abdominal pain. The patient has known fibroids that cause her to bleed and have pelvic pain. She has surgery scheduled with Dr. Debroah Loop on 11/20/12. She states that she has been through about 6 pads today already, sometimes in less than 30 minutes. She states that she is also passing large clots. She rates her pain at 9/10 now and is out of Percocet. No fever, N/V, weakness, dizziness. She does feel somewhat lightheaded.   OB History   Grav Para Term Preterm Abortions TAB SAB Ect Mult Living   2 2 2      1 3       Past Medical History  Diagnosis Date  . Chronic systolic heart failure   . Stroke   . Hypertension   . MITRAL REGURGITATION 10/15/2008    Qualifier: Diagnosis of  By: Denyse Amass CMA, Okey Regal    . DYSLIPIDEMIA 06/08/2010    Qualifier: Diagnosis of  By: Huntley Dec, Scott    . 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 Alopecia   . 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 Noncompliance   . Tobacco abuse   . CHF (congestive heart failure)   . Fibroids   . Abnormal uterine bleeding     Past Surgical History  Procedure Laterality Date  . Caesarian    . Heart loop recorder    . Cesarean section  1992  1994  . Tubal ligation  1994    Family History  Problem Relation Age of Onset  . Other Neg Hx   . Cancer Maternal Grandmother     uterine  . Hypertension Sister     History  Substance Use Topics  . Smoking status: Current Every Day Smoker -- 0.25 packs/day    Types: Cigarettes  . Smokeless tobacco: Never Used  . Alcohol Use: No    Allergies:   Allergies  Allergen Reactions  . Ace Inhibitors     REACTION: Cough    Prescriptions prior to admission  Medication Sig Dispense Refill  . carvedilol (COREG) 3.125 MG tablet Take 1 tablet (3.125 mg total) by mouth 2 (two) times daily with a meal.  60 tablet  12  . ferrous sulfate 325 (65 FE) MG tablet Take 1 tablet (325 mg total) by mouth 2 (two) times daily.  60 tablet  4  . fish oil-omega-3 fatty acids 1000 MG capsule Take 1 capsule (1 g total) by mouth daily.  30 capsule  4  . furosemide (LASIX) 20 MG tablet Take 1 tablet (20 mg total) by mouth daily.  60 tablet  5  . hydrALAZINE (APRESOLINE) 25 MG tablet Take 1 tablet (25 mg total) by mouth 2 (two) times daily.  60 tablet  11  . oxyCODONE-acetaminophen (PERCOCET/ROXICET) 5-325 MG per tablet Take 1 tablet by mouth every 6 (six) hours as needed for pain.  20 tablet  0  . potassium chloride SA (KLOR-CON M20) 20 MEQ tablet Take 1 tablet (20 mEq total) by mouth daily.  30 tablet  5  . pravastatin (  PRAVACHOL) 20 MG tablet Take 1 tablet (20 mg total) by mouth daily.  30 tablet  4    Review of Systems  Constitutional: Negative for fever, chills and malaise/fatigue.  Gastrointestinal: Positive for abdominal pain. Negative for nausea, vomiting, diarrhea and constipation.  Genitourinary: Negative for dysuria, urgency and frequency.       + vaginal bleeding Neg - vaginal discharge  Neurological: Negative for dizziness, loss of consciousness and weakness.   Physical Exam   Blood pressure 114/65, pulse 91, temperature 98.6 F (37 C), temperature source Oral, resp. rate 16, SpO2 100.00%.  Physical Exam  Constitutional: She is oriented to person, place, and time. She appears well-developed and well-nourished. No distress.  HENT:  Head: Normocephalic and atraumatic.  Cardiovascular: Normal rate, regular rhythm and normal heart sounds.   Respiratory: Effort normal and breath sounds normal. No respiratory distress.  GI: Soft. Bowel sounds are  normal. She exhibits no distension and no mass. There is tenderness (moderate tenderness to palpation of the LLQ). There is no rebound and no guarding.  Genitourinary: Uterus is enlarged. There is bleeding (moderate bleeding noted in the vagina) around the vagina. No vaginal discharge found.  Neurological: She is alert and oriented to person, place, and time.  Skin: Skin is warm and dry. No erythema.  Psychiatric: She has a normal mood and affect.   Results for orders placed during the hospital encounter of 10/13/12 (from the past 24 hour(s))  CBC     Status: Abnormal   Collection Time    10/13/12  4:28 PM      Result Value Range   WBC 7.8  4.0 - 10.5 K/uL   RBC 4.00  3.87 - 5.11 MIL/uL   Hemoglobin 9.5 (*) 12.0 - 15.0 g/dL   HCT 16.1 (*) 09.6 - 04.5 %   MCV 75.8 (*) 78.0 - 100.0 fL   MCH 23.8 (*) 26.0 - 34.0 pg   MCHC 31.4  30.0 - 36.0 g/dL   RDW 40.9 (*) 81.1 - 91.4 %   Platelets 315  150 - 400 K/uL    MAU Course  Procedures None  MDM CBC today Hgb dropped from 11.6 2 weeks ago.  Discussed patient with Dr. Jolayne Panther. Start patient on Provera daily until surgery to control bleeding and refill pain medication.   Assessment and Plan  A: DUB secondary to fibroids Anemia  P: Discharge home Rx for Percocet and Provera given/sent to patient's pharmacy Patient advised to keep appointment for surgery with Dr. Debroah Loop Patient may return to MAU as needed or if her condition were to change or worsen  Freddi Starr, PA-C  10/13/2012, 5:03 PM

## 2012-10-13 NOTE — MAU Note (Signed)
Patient states has been bleeding and having abdominal pain for about 2 months. Patient states she is out of pain medication and has been bleeding heavy since Monday.

## 2012-10-15 NOTE — MAU Provider Note (Signed)
Attestation of Attending Supervision of Advanced Practitioner (CNM/NP): Evaluation and management procedures were performed by the Advanced Practitioner under my supervision and collaboration.  I have reviewed the Advanced Practitioner's note and chart, and I agree with the management and plan.  D'Arcy Abraha 10/15/2012 8:18 AM

## 2012-11-08 ENCOUNTER — Encounter (HOSPITAL_COMMUNITY): Payer: Self-pay | Admitting: Pharmacist

## 2012-11-10 ENCOUNTER — Telehealth: Payer: Self-pay | Admitting: *Deleted

## 2012-11-10 DIAGNOSIS — R102 Pelvic and perineal pain: Secondary | ICD-10-CM

## 2012-11-10 DIAGNOSIS — D219 Benign neoplasm of connective and other soft tissue, unspecified: Secondary | ICD-10-CM

## 2012-11-10 MED ORDER — HYDROCODONE-ACETAMINOPHEN 5-325 MG PO TABS: 1.0000 | ORAL_TABLET | Freq: Four times a day (QID) | ORAL | Status: DC | PRN

## 2012-11-10 NOTE — Telephone Encounter (Signed)
Shresta called and left a message 11/09/12 at 1:27pm requesting a refill of pills to last until 11/20/12- states uterus started hurting- request a call.

## 2012-11-10 NOTE — Telephone Encounter (Signed)
Reviewed Chelsea Branch's chart has known fibroids with plan for TAH 11/20/12, last pain med rx 10/13/12 for percocet 30 tabs. Called and discussed with Dr. Penne Lash and order received and sent to pharmacy for Norco 25 tabs. Called and left a message for Chelsea Branch on her voicemail we received her message and prescription sent to pharmacy.

## 2012-11-13 ENCOUNTER — Telehealth: Payer: Self-pay | Admitting: *Deleted

## 2012-11-13 DIAGNOSIS — D219 Benign neoplasm of connective and other soft tissue, unspecified: Secondary | ICD-10-CM

## 2012-11-13 DIAGNOSIS — R102 Pelvic and perineal pain: Secondary | ICD-10-CM

## 2012-11-13 MED ORDER — HYDROCODONE-ACETAMINOPHEN 5-325 MG PO TABS: 1.0000 | ORAL_TABLET | Freq: Four times a day (QID) | ORAL | Status: DC | PRN

## 2012-11-13 NOTE — Telephone Encounter (Signed)
Patient left a message that her medicine is not at the pharmacy. The nurse she talked to on Friday told her she would send it in. She check with [facility] and it is not there.

## 2012-11-13 NOTE — Telephone Encounter (Signed)
Patient left a message that the prescription that the nurse gave her is not the right one. She needs Percocet. That is what she has been getting for her pain.

## 2012-11-13 NOTE — Telephone Encounter (Signed)
Called [facility]- informed her we thought the norco would eprescribe ,but it didn't- apologized to her- and will reprint and get a doctor to sign- Shelbee will pick up prescription today

## 2012-11-14 NOTE — Telephone Encounter (Signed)
Called pt and informed pt that that is the medication the provider chose.  Pt stated "ok, its a little higher so I'll wait to pick it up."  Then pt hung up the phone.

## 2012-11-16 ENCOUNTER — Encounter (HOSPITAL_COMMUNITY): Payer: Self-pay

## 2012-11-16 ENCOUNTER — Other Ambulatory Visit: Payer: Self-pay

## 2012-11-16 ENCOUNTER — Encounter (HOSPITAL_COMMUNITY)
Admission: RE | Admit: 2012-11-16 | Discharge: 2012-11-16 | Disposition: A | Discharge status: Home / Self Care | Payer: Self-pay | Source: Ambulatory Visit | Attending: Obstetrics & Gynecology | Admitting: Obstetrics & Gynecology

## 2012-11-16 DIAGNOSIS — Z01812 Encounter for preprocedural laboratory examination: Secondary | ICD-10-CM | POA: Insufficient documentation

## 2012-11-16 DIAGNOSIS — Z01818 Encounter for other preprocedural examination: Secondary | ICD-10-CM | POA: Insufficient documentation

## 2012-11-16 LAB — BASIC METABOLIC PANEL
BUN: 7 mg/dL (ref 6–23)
Calcium: 9.3 mg/dL (ref 8.4–10.5)
Creatinine, Ser: 0.68 mg/dL (ref 0.50–1.10)
GFR calc Af Amer: >90 mL/min (ref >90)
GFR calc non Af Amer: >90 mL/min (ref >90)

## 2012-11-16 LAB — CBC
HCT: 29.3 % — ABNORMAL LOW (ref 36.0–46.0)
MCHC: 30 g/dL (ref 30.0–36.0)
MCV: 71.3 fL — ABNORMAL LOW (ref 78.0–100.0)
Platelets: 323 10*3/uL (ref 150–400)
RDW: 17.4 % — ABNORMAL HIGH (ref 11.5–15.5)

## 2012-11-16 NOTE — Pre-Procedure Instructions (Signed)
Medical history and EKG reviewed with Dr Malen Gauze and Dr Sherron Ales. Pt to come in at 7am day of surgery for A-line placement. Pt instructed.

## 2012-11-16 NOTE — Patient Instructions (Addendum)
Chelsea Branch  11/16/2012   Your procedure is scheduled on:  11/20/12  Enter through the Main Entrance of [facility] Main at 8 AM.  Pick up the phone at the desk and dial [phone].   Call this number if you have problems the morning of surgery: [phone]   Remember:   Do not eat food:After Midnight.  Do not drink clear liquids: After Midnight.  Take these medicines the morning of surgery with A SIP OF WATER: Take all heart and blood pressure medications as directed. Do not take Lasix   Do not wear jewelry, make-up or nail polish.  Do not wear lotions, powders, or perfumes. You may wear deodorant.  Do not shave 48 hours prior to surgery.  Do not bring valuables to the hospital.  [facility] is not responsible                  for any belongings or valuables brought to the hospital.  Contacts, dentures or bridgework may not be worn into surgery.  Leave suitcase in the car. After surgery it may be brought to your room.  For patients admitted to the hospital, checkout time is 11:00 AM the day of                discharge.   Patients discharged the day of surgery will not be allowed to drive                   home.  Name and phone number of your driver: na  Special Instructions: Shower using CHG 2 nights before surgery and the night before surgery.  If you shower the day of surgery use CHG.  Use special wash - you have one bottle of CHG for all showers.  You should use approximately 1/3 of the bottle for each shower.   Please read over the following fact sheets that you were given: MRSA Information

## 2012-11-19 ENCOUNTER — Encounter (HOSPITAL_COMMUNITY): Payer: Self-pay | Admitting: Anesthesiology

## 2012-11-20 ENCOUNTER — Ambulatory Visit (HOSPITAL_COMMUNITY)
Admission: RE | Admit: 2012-11-20 | Discharge: 2012-11-20 | DRG: 761 | Disposition: A | Discharge status: Home / Self Care | Payer: Self-pay | Source: Ambulatory Visit | Attending: Obstetrics & Gynecology | Admitting: Obstetrics & Gynecology

## 2012-11-20 ENCOUNTER — Encounter (HOSPITAL_COMMUNITY): Payer: Self-pay | Admitting: *Deleted

## 2012-11-20 ENCOUNTER — Telehealth: Payer: Self-pay | Admitting: Internal Medicine

## 2012-11-20 ENCOUNTER — Encounter (HOSPITAL_COMMUNITY)
Admission: RE | Disposition: A | Discharge status: Home / Self Care | Payer: Self-pay | Source: Ambulatory Visit | Attending: Obstetrics & Gynecology

## 2012-11-20 DIAGNOSIS — I509 Heart failure, unspecified: Secondary | ICD-10-CM | POA: Diagnosis present

## 2012-11-20 DIAGNOSIS — D250 Submucous leiomyoma of uterus: Secondary | ICD-10-CM | POA: Diagnosis present

## 2012-11-20 DIAGNOSIS — Z8673 Personal history of transient ischemic attack (TIA), and cerebral infarction without residual deficits: Secondary | ICD-10-CM

## 2012-11-20 DIAGNOSIS — Z5309 Procedure and treatment not carried out because of other contraindication: Secondary | ICD-10-CM

## 2012-11-20 DIAGNOSIS — I10 Essential (primary) hypertension: Secondary | ICD-10-CM | POA: Diagnosis present

## 2012-11-20 DIAGNOSIS — D251 Intramural leiomyoma of uterus: Secondary | ICD-10-CM | POA: Diagnosis present

## 2012-11-20 DIAGNOSIS — I059 Rheumatic mitral valve disease, unspecified: Secondary | ICD-10-CM | POA: Diagnosis present

## 2012-11-20 SURGERY — HYSTERECTOMY, ABDOMINAL
Anesthesia: Choice | Site: Abdomen

## 2012-11-20 MED ORDER — DEXAMETHASONE SODIUM PHOSPHATE 10 MG/ML IJ SOLN
INTRAMUSCULAR | Status: AC
  Filled 2012-11-20: qty 1

## 2012-11-20 MED ORDER — LACTATED RINGERS IV SOLN: INTRAVENOUS | Status: DC

## 2012-11-20 MED ORDER — MIDAZOLAM HCL 2 MG/2ML IJ SOLN
INTRAMUSCULAR | Status: AC
  Filled 2012-11-20: qty 2

## 2012-11-20 MED ORDER — FENTANYL CITRATE 0.05 MG/ML IJ SOLN
INTRAMUSCULAR | Status: AC
  Filled 2012-11-20: qty 5

## 2012-11-20 MED ORDER — ONDANSETRON HCL 4 MG/2ML IJ SOLN
INTRAMUSCULAR | Status: AC
  Filled 2012-11-20: qty 2

## 2012-11-20 MED ORDER — LIDOCAINE HCL (CARDIAC) 20 MG/ML IV SOLN
INTRAVENOUS | Status: AC
  Filled 2012-11-20: qty 5

## 2012-11-20 MED ORDER — ACETAMINOPHEN 160 MG/5ML PO SOLN
ORAL | Status: AC
  Administered 2012-11-20: 975 mg via ORAL
  Filled 2012-11-20: qty 40.6

## 2012-11-20 MED ORDER — ACETAMINOPHEN 160 MG/5ML PO SOLN: 975.0000 mg | Freq: Once | ORAL | Status: AC

## 2012-11-20 MED ORDER — PROPOFOL 10 MG/ML IV EMUL
INTRAVENOUS | Status: AC
  Filled 2012-11-20: qty 20

## 2012-11-20 SURGICAL SUPPLY — 29 items
CANISTER SUCTION 2500CC (MISCELLANEOUS) IMPLANT
CHLORAPREP W/TINT 26ML (MISCELLANEOUS) IMPLANT
CLOTH BEACON ORANGE TIMEOUT ST (SAFETY) IMPLANT
CONT PATH 16OZ SNAP LID 3702 (MISCELLANEOUS) IMPLANT
GAUZE SPONGE 4X4 16PLY XRAY LF (GAUZE/BANDAGES/DRESSINGS) IMPLANT
GLOVE BIO SURGEON STRL SZ 6.5 (GLOVE) IMPLANT
GLOVE BIOGEL PI IND STRL 7.0 (GLOVE) IMPLANT
GLOVE BIOGEL PI INDICATOR 7.0 (GLOVE)
GOWN PREVENTION PLUS LG XLONG (DISPOSABLE) IMPLANT
NEEDLE HYPO 22GX1.5 SAFETY (NEEDLE) IMPLANT
NS IRRIG 1000ML POUR BTL (IV SOLUTION) IMPLANT
PACK ABDOMINAL GYN (CUSTOM PROCEDURE TRAY) IMPLANT
PAD OB MATERNITY 4.3X12.25 (PERSONAL CARE ITEMS) IMPLANT
PROTECTOR NERVE ULNAR (MISCELLANEOUS) IMPLANT
SPONGE LAP 18X18 X RAY DECT (DISPOSABLE) IMPLANT
STAPLER VISISTAT 35W (STAPLE) IMPLANT
SUT VIC AB 0 CT1 18XCR BRD8 (SUTURE) IMPLANT
SUT VIC AB 0 CT1 27 (SUTURE)
SUT VIC AB 0 CT1 27XBRD ANBCTR (SUTURE) IMPLANT
SUT VIC AB 0 CT1 36 (SUTURE) IMPLANT
SUT VIC AB 0 CT1 8-18 (SUTURE)
SUT VIC AB 2-0 CT1 27 (SUTURE)
SUT VIC AB 2-0 CT1 TAPERPNT 27 (SUTURE) IMPLANT
SUT VIC AB 4-0 PS2 27 (SUTURE) IMPLANT
SUT VICRYL 0 TIES 12 18 (SUTURE) IMPLANT
SYR CONTROL 10ML LL (SYRINGE) IMPLANT
TOWEL OR 17X24 6PK STRL BLUE (TOWEL DISPOSABLE) IMPLANT
TRAY FOLEY CATH 14FR (SET/KITS/TRAYS/PACK) IMPLANT
WATER STERILE IRR 1000ML POUR (IV SOLUTION) IMPLANT

## 2012-11-20 NOTE — Telephone Encounter (Signed)
New Prob    Requesting samples of any of her medication that she is currently on.

## 2012-11-20 NOTE — Telephone Encounter (Signed)
Left message for pt to call.

## 2012-11-20 NOTE — Progress Notes (Signed)
Case cancelled.  Patient has CHF with EF of 25% on most recent echo (6/11) and h/o stroke 2 years ago.  She admits to tiring easily and SOB with exertion.  She has taken none of her medications in the past week - including her beta-blocker.  Situation discussed with Dr Debroah Loop, who agrees that patient should see cardiology for echo, pre-op optimization and clearance, resume her medications and then be rescheduled for surgery.  Discussed at length with patient that her medical conditions (CHF, HTN, CVA, smoker) put her at high risk for cardiac event in the perioperative period and that medication compliance, especially with her beta-blocker, and smoking cessation will improve her risk.  Echo ordered, and Janene Harvey, RN obtained appointments for echo (july 2) and cardiology office visit (july 17) and communicated these to patient.  Dr Debroah Loop to obtain clinic appointment for patient prior to rescheduling surgery.  Patient will be seen again in PAT prior to surgery to make sure that appropriate cardiac testing and clearance have been completed, and to make sure that medication compliance is optimal in preparation for surgery.  Jasmine December, MD

## 2012-11-20 NOTE — Progress Notes (Signed)
Dr Rodman Pickle cancelled surgery.  Patient needs to have echocardiogram and cardiac clearance before surgery can be performed.  Appointments made for patient November 22, 2012 at 3pm for echco and December 07, 2012 at 3pm to follow up with Dr Graciela Husbands for cardiac clearance.  Patient informed and verbalized understanding.  Patient will need new PAT appt  Prior to surgery per Dr Rodman Pickle.

## 2012-11-20 NOTE — Telephone Encounter (Signed)
Spoke with pt, aware we do not have any samples of the generic meds she is currently taking.

## 2012-11-20 NOTE — Progress Notes (Signed)
D/c iv in SS - surgery cancelled.

## 2012-11-20 NOTE — Anesthesia Preprocedure Evaluation (Deleted)
Anesthesia Evaluation  Patient identified by MRN, date of birth, ID band Patient awake    Reviewed: Allergy & Precautions, H&P , NPO status , Patient's Chart, lab work & pertinent test results, reviewed documented beta blocker date and time   History of Anesthesia Complications Negative for: history of anesthetic complications  Airway       Dental   Pulmonary Current Smoker,  breath sounds clear to auscultation  Pulmonary exam normal       Cardiovascular Exercise Tolerance: Good hypertension (on coreg - but has not taken it in one week), On Home Beta Blockers and On Medications +CHF (EF 25% on echo in 6/11, has not had an echo since.  Last saw cards in 11/13. Started as peripartum CM in 1994, improved, but has since worsened.) + Valvular Problems/Murmurs (moderate mitral regurg) MR Rhythm:regular Rate:Normal + Systolic murmurs Non-compliant with meds due to intolerance (has never taken her nitrates b/c does not like how they make her feel) and expense (has not taken any of her meds in past week - including her beta-blocker). 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 CVA (2011) negative psych ROS   GI/Hepatic negative GI ROS, Neg liver ROS,   Endo/Other  negative endocrine ROS  Renal/GU   Female GU complaint     Musculoskeletal   Abdominal   Peds  Hematology  (+) anemia , hgb 8.8   Anesthesia Other Findings   Reproductive/Obstetrics negative OB ROS                          Anesthesia Physical Anesthesia Plan  ASA: III  Anesthesia Plan: General ETT   Post-op Pain Management:    Induction:   Airway Management Planned:   Additional Equipment: Arterial line  Intra-op Plan:   Post-operative Plan: Possible  Post-op intubation/ventilation  Informed Consent: I have reviewed the patients History and Physical, chart, labs and discussed the procedure including the risks, benefits and alternatives for the proposed anesthesia with the patient or authorized representative who has indicated his/her understanding and acceptance.   Dental Advisory Given  Plan Discussed with: CRNA and Surgeon  Anesthesia Plan Comments: (Case cancelled.  Patient has CHF with EF of 25% on most recent echo (6/11) and h/o stroke 2 years ago.  She admits to tiring easily and SOB with exertion.  She has taken none of her medications in the past week - including her beta-blocker.  Situation discussed with Dr Debroah Loop, who agrees that patient should see cardiology for echo, pre-op optimization and clearance, resume her medications and then be rescheduled for surgery.  Discussed at length with patient that her medical conditions (CHF, HTN, CVA, smoker) put her at high risk for cardiac event in the perioperative period and that medication compliance, especially with her beta-blocker, and smoking cessation will improve her risk.  Echo ordered, and Janene Harvey, RN obtained appointments for echo (july 2) and cardiology office visit (july 17) and communicated these to patient.  Dr Debroah Loop to obtain clinic appointment for patient prior to rescheduling surgery.  Patient will be seen again in PAT prior to surgery to make sure that appropriate cardiac testing and clearance have been completed, and to make sure that medication compliance is optimal in preparation for surgery.  Jasmine December, MD)  Anesthesia Quick Evaluation  

## 2012-11-21 ENCOUNTER — Other Ambulatory Visit (HOSPITAL_COMMUNITY): Payer: Self-pay | Admitting: Anesthesiology

## 2012-11-21 DIAGNOSIS — Z01810 Encounter for preprocedural cardiovascular examination: Secondary | ICD-10-CM

## 2012-11-21 DIAGNOSIS — R0602 Shortness of breath: Secondary | ICD-10-CM

## 2012-11-21 DIAGNOSIS — I428 Other cardiomyopathies: Secondary | ICD-10-CM

## 2012-11-21 DIAGNOSIS — I509 Heart failure, unspecified: Secondary | ICD-10-CM

## 2012-11-21 NOTE — H&P (Signed)
[name], female DOB: 1965/08/06, 47 y.o. MRN: [id number]  Chief Complaint   Patient presents with   .  Follow-up     fibroids/bleeding   HPI  [name] is a 47 y.o. female. No LMP recorded.  [id number]  She continues to bleed despite megace, cramp. Wants to have hysterectomy  HPI  Past Medical History   Diagnosis  Date   .  Chronic systolic heart failure    .  Stroke    .  Hypertension    .  MITRAL REGURGITATION  10/15/2008     Qualifier: Diagnosis of By: Denyse Amass CMA, Okey Regal   .  DYSLIPIDEMIA  06/08/2010     Qualifier: Diagnosis of By: Huntley Dec, Scott   .  Moe Graca-CHIARI MALFORMATION  06/08/2010     Qualifier: Diagnosis of By: Huntley Dec, Scott   .  Alopecia    .  Nonischemic cardiomyopathy      iniatially presumed 2/2 peripartum with improvement and then worsening   .  Noncompliance    .  Tobacco abuse    .  CHF (congestive heart failure)    .  Fibroids    .  Abnormal uterine bleeding     Past Surgical History   Procedure  Laterality  Date   .  Caesarian     .  Heart loop recorder     .  Cesarean section   1992 1994   .  Tubal ligation   1994    Family History   Problem  Relation  Age of Onset   .  Other  Neg Hx    .  Cancer  Maternal Grandmother      uterine   .  Hypertension  Sister    Social History  History   Substance Use Topics   .  Smoking status:  Current Every Day Smoker -- 0.50 packs/day     Types:  Cigarettes   .  Smokeless tobacco:  Not on file   .  Alcohol Use:  No    Allergies   Allergen  Reactions   .  Ace Inhibitors      REACTION: Cough    Current Outpatient Prescriptions   Medication  Sig  Dispense  Refill   .  carvedilol (COREG) 3.125 MG tablet  Take 1 tablet (3.125 mg total) by mouth 2 (two) times daily with a meal.  60 tablet  12   .  ferrous sulfate 325 (65 FE) MG tablet  Take 1 tablet (325 mg total) by mouth 2 (two) times daily.  60 tablet  4   .  fish oil-omega-3 fatty acids 1000 MG capsule  Take 1 capsule (1 g  total) by mouth daily.  30 capsule  4   .  furosemide (LASIX) 20 MG tablet  Take 1 tablet (20 mg total) by mouth daily.  60 tablet  5   .  hydrALAZINE (APRESOLINE) 25 MG tablet  Take 1 tablet (25 mg total) by mouth 2 (two) times daily.  60 tablet  11   .  ibuprofen (ADVIL,MOTRIN) 800 MG tablet  Take 800 mg by mouth every 8 (eight) hours as needed.     .  isosorbide mononitrate (IMDUR) 30 MG 24 hr tablet  Take 30 mg by mouth daily.     .  megestrol (MEGACE) 20 MG tablet  TAKE TWO TABLETS BY MOUTH EVERY DAY  30 tablet  6   .  potassium chloride SA (KLOR-CON  M20) 20 MEQ tablet  Take 1 tablet (20 mEq total) by mouth daily.  30 tablet  5   .  pravastatin (PRAVACHOL) 20 MG tablet  Take 1 tablet (20 mg total) by mouth daily.  30 tablet  4   .  spironolactone (ALDACTONE) 25 MG tablet  Take 1 tablet (25 mg total) by mouth daily.  30 tablet  6   .  aspirin 325 MG tablet  Take 162 mg by mouth daily.     .  cephALEXin (KEFLEX) 500 MG capsule  Take 1 capsule (500 mg total) by mouth 4 (four) times daily.  28 capsule  0   .  ibuprofen (ADVIL,MOTRIN) 800 MG tablet  Take 1 tablet (800 mg total) by mouth every 8 (eight) hours as needed for pain.  60 tablet  1    No current facility-administered medications for this visit.   Review of Systems  Review of Systems  Constitutional: Positive for fatigue. Negative for fever.  Gastrointestinal: Positive for abdominal pain and abdominal distention.  Genitourinary: Positive for vaginal bleeding, menstrual problem and pelvic pain.  Blood pressure 151/97, pulse 99, temperature 98.7 F (37.1 C), temperature source Oral, height 5\' 8"  (1.727 m), weight 199 lb 3.2 oz (90.357 kg).  Physical Exam  Physical Exam  Constitutional: She appears well-developed. No distress.  Pulmonary/Chest: Effort normal. No respiratory distress.  Abdominal: She exhibits mass (suprapubic). She exhibits no distension. There is no tenderness.  Skin: She is not diaphoretic.  Psychiatric:  Mildly  anxious  CBC    Component  Value  Date/Time    WBC  9.3  04/19/2012 2303    RBC  4.32  04/19/2012 2303    HGB  11.7*  04/19/2012 2303    HCT  35.0*  04/19/2012 2303    PLT  272  04/19/2012 2303    MCV  81.0  04/19/2012 2303    MCH  27.1  04/19/2012 2303    MCHC  33.4  04/19/2012 2303    RDW  14.7  04/19/2012 2303    LYMPHSABS  2.3  04/19/2012 2303    MONOABS  0.5  04/19/2012 2303    EOSABS  0.1  04/19/2012 2303    BASOSABS  0.0  04/19/2012 2303   Data Reviewed  *RADIOLOGY REPORT*  Clinical Data: Follow-up fibroids and bleeding. LMP 07/26/2012  TRANSABDOMINAL AND TRANSVAGINAL ULTRASOUND OF PELVIS  Technique: Both transabdominal and transvaginal ultrasound  examinations of the pelvis were performed. Transabdominal technique  was performed for global imaging of the pelvis including uterus,  ovaries, adnexal regions, and pelvic cul-de-sac.  It was necessary to proceed with endovaginal exam following the  transabdominal exam to visualize the uterus and ovaries.  Comparison: Pelvic ultrasound 04/19/2012  Findings:  Uterus: The uterus is enlarged, measuring 14.3 x 9.8 x 9.5 cm and  contains multiple heterogeneous rounded masses with associated  shadowing compatible with fibroids. In the lower uterine segment  is a submucosal fibroid that measures 1.9 x 2.3 x 2.5 cm. In the  left uterine fundus is a 4.5 x 3.4 x 3.4 cm intramural fibroid. In  the right posterior uterine body is an intramural 2.4 x 2.6 x 2.5  cm fibroid. In the left lower uterine segment is an intramural 2.2  x 2.4 x 2.2 cm fibroid.  Endometrium: The endometrium is poorly visualized due to the  multiple fibroids. There is suggestion of heterogeneous  endometrial thickening at the level of the lower uterine segment,  measuring up  to 2.0 cm in thickness. Endometrium at the level of  the uterine fundus is not well visualized, but on image number 18  is estimated to be within normal limits at 8 mm.  Right ovary: Normal  appearance/no adnexal mass. Measures 2.6 x  0.9 x 1.7 cm.  Left ovary: Normal appearance/no adnexal mass. Measures 2.5 x 1.6  x 2.4 cm.  Other findings: No free fluid  IMPRESSION:  1. Enlarged fibroid uterus. At least one submucosal fibroid is  seen in the lower uterine segment. The remainder the visualized  fibroids are intramural in position.  2. The endometrium is poorly visualized due to the presence of the  fibroids. The endometrial thickness does appear prominent at the  level of the lower uterine segment, where it measures approximately  2.0 cm.  3. Normal ovaries.  Original Report Authenticated By: Britta Mccreedy, M.D.  Assessment  Symptomatic fibroid uterus, bleeding, pain, unresponsive to progestin  Plan  Offered TAH/BSO. The procedure and risks of anesthesia, bleeding, transfusion, visceral organ damage, infection were discussed. She has applied for medicaid. Will schedule a pre-op visit.  Tamberlyn Midgley  09/11/2012, 5:07 PM  No pre-op visit was done. Patient has no taken her medications for 5 days prior to scheduled surgery. Canceled per Dr. Rodman Pickle due to increased CV risk off meds. Cardiology clearance requested, f/u in Gyn clinic.  Adam Phenix, MD 11/21/2012

## 2012-11-22 ENCOUNTER — Ambulatory Visit (HOSPITAL_COMMUNITY): Payer: Self-pay | Attending: Anesthesiology | Admitting: Radiology

## 2012-11-22 DIAGNOSIS — I428 Other cardiomyopathies: Secondary | ICD-10-CM

## 2012-11-22 DIAGNOSIS — I509 Heart failure, unspecified: Secondary | ICD-10-CM

## 2012-11-22 DIAGNOSIS — F17200 Nicotine dependence, unspecified, uncomplicated: Secondary | ICD-10-CM | POA: Insufficient documentation

## 2012-11-22 DIAGNOSIS — I059 Rheumatic mitral valve disease, unspecified: Secondary | ICD-10-CM | POA: Insufficient documentation

## 2012-11-22 DIAGNOSIS — E785 Hyperlipidemia, unspecified: Secondary | ICD-10-CM | POA: Insufficient documentation

## 2012-11-22 DIAGNOSIS — I10 Essential (primary) hypertension: Secondary | ICD-10-CM | POA: Insufficient documentation

## 2012-11-22 DIAGNOSIS — R0602 Shortness of breath: Secondary | ICD-10-CM

## 2012-11-22 DIAGNOSIS — I5022 Chronic systolic (congestive) heart failure: Secondary | ICD-10-CM

## 2012-11-22 DIAGNOSIS — Z01810 Encounter for preprocedural cardiovascular examination: Secondary | ICD-10-CM

## 2012-11-22 NOTE — Progress Notes (Signed)
Echocardiogram performed.  

## 2012-12-01 ENCOUNTER — Telehealth: Payer: Self-pay | Admitting: *Deleted

## 2012-12-01 NOTE — Telephone Encounter (Signed)
Pt is requesting a refill on Spirolactone medication is not on list after she left the hospital unsure if patient is suppose to be on this medication. Will route this to Dr. Odessa Fleming Nurse regarding this issue(shes on Lasix)

## 2012-12-05 MED ORDER — SPIRONOLACTONE 25 MG PO TABS: 25.0000 mg | ORAL_TABLET | Freq: Every day | ORAL | Status: DC

## 2012-12-05 NOTE — Telephone Encounter (Signed)
I think she is still supposed to be on Spironolactone at 25 mg once daily. She has had some cost issues getting it filled. The discontinue reason was documented that she had not taken the medication in 30 days. If the request came through I would go ahead and refill it for her. Thanks.

## 2012-12-05 NOTE — Addendum Note (Signed)
Addended by: Kem Parkinson on: 12/05/2012 02:20 PM   Modules accepted: Orders

## 2012-12-07 ENCOUNTER — Ambulatory Visit (INDEPENDENT_AMBULATORY_CARE_PROVIDER_SITE_OTHER): Payer: Medicaid Other | Admitting: Obstetrics & Gynecology

## 2012-12-07 ENCOUNTER — Encounter: Payer: Self-pay | Admitting: Internal Medicine

## 2012-12-07 ENCOUNTER — Ambulatory Visit (INDEPENDENT_AMBULATORY_CARE_PROVIDER_SITE_OTHER): Payer: Medicaid Other | Admitting: Internal Medicine

## 2012-12-07 VITALS — BP 150/94 | HR 86 | Ht 68.0 in | Wt 199.0 lb

## 2012-12-07 VITALS — BP 138/70 | HR 83 | Temp 96.8°F | Ht 68.0 in | Wt 201.0 lb

## 2012-12-07 DIAGNOSIS — Z01810 Encounter for preprocedural cardiovascular examination: Secondary | ICD-10-CM

## 2012-12-07 DIAGNOSIS — I428 Other cardiomyopathies: Secondary | ICD-10-CM

## 2012-12-07 DIAGNOSIS — D219 Benign neoplasm of connective and other soft tissue, unspecified: Secondary | ICD-10-CM

## 2012-12-07 DIAGNOSIS — D259 Leiomyoma of uterus, unspecified: Secondary | ICD-10-CM

## 2012-12-07 DIAGNOSIS — E876 Hypokalemia: Secondary | ICD-10-CM

## 2012-12-07 NOTE — Patient Instructions (Addendum)
Your physician has recommended you make the following change in your medication:  1) Stop lasix (furosemide).  Your physician recommends that you return for lab work in: 2 weeks- bmp  Your physician has requested that you have an echocardiogram in 2 weeks. Echocardiography is a painless test that uses sound waves to create images of your heart. It provides your doctor with information about the size and shape of your heart and how well your heart's chambers and valves are working. This procedure takes approximately one hour. There are no restrictions for this procedure.  Your physician wants you to follow-up in: 1 year with Dr. Graciela Husbands. You will receive a reminder letter in the mail two months in advance. If you don't receive a letter, please call our office to schedule the follow-up appointment.

## 2012-12-07 NOTE — Progress Notes (Signed)
Patient Care Team: [name], MD as PCP - Cardiology (Cardiology)   HPI  Analyce Branch is a 47 y.o. 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 She had recovery of LV function most recently in 2008. 2011 left ventricular function had decreased to 25%.  She has significant hypertension. She's a history of hypokalemia. She was struggling with affording her medications but she now has Medicaid. She is currently on ARB therapy and beta blocker her last electrolytes Her last potassium remained so part 3.3 notwithstanding therapy with potassium and Aldactone.    Past Medical History  Diagnosis Date  . Chronic systolic heart failure   . Hypertension   . MITRAL REGURGITATION 10/15/2008    Qualifier: Diagnosis of  By: Denyse Amass CMA, Okey Regal    . DYSLIPIDEMIA 06/08/2010    Qualifier: Diagnosis of  By: Huntley Dec, Scott    . 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 Alopecia   . 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 Noncompliance   . Tobacco abuse   . CHF (congestive heart failure)   . Fibroids   . Abnormal uterine bleeding   . Stroke     Past Surgical History  Procedure Laterality Date  . Caesarian    . Heart loop recorder    . Cesarean section  1992  1994  . Tubal ligation  1994    Current Outpatient Prescriptions  Medication Sig Dispense Refill  . carvedilol (COREG) 3.125 MG tablet Take 1 tablet (3.125 mg total) by mouth 2 (two) times daily with a meal.  60 tablet  12  . ferrous sulfate 325 (65 FE) MG tablet Take 1 tablet (325 mg total) by mouth 2 (two) times daily.  60 tablet  4  . fish oil-omega-3 fatty acids 1000 MG capsule Take 1 capsule (1 g total) by mouth daily.  30 capsule  4  . furosemide (LASIX) 20 MG tablet Take 1 tablet (20 mg total) by mouth daily.  60 tablet  5  . hydrALAZINE (APRESOLINE) 25 MG tablet Take 1  tablet (25 mg total) by mouth 2 (two) times daily.  60 tablet  11  . HYDROcodone-acetaminophen (NORCO) 5-325 MG per tablet Take 1 tablet by mouth every 6 (six) hours as needed for pain.  25 tablet  0  . medroxyPROGESTERone (PROVERA) 10 MG tablet Take 1 tablet (10 mg total) by mouth daily.  40 tablet  0  . oxyCODONE-acetaminophen (PERCOCET/ROXICET) 5-325 MG per tablet Take 1 tablet by mouth every 6 (six) hours as needed for pain.  30 tablet  0  . potassium chloride SA (KLOR-CON M20) 20 MEQ tablet Take 1 tablet (20 mEq total) by mouth daily.  30 tablet  5  . pravastatin (PRAVACHOL) 20 MG tablet Take 1 tablet (20 mg total) by mouth daily.  30 tablet  4  . spironolactone (ALDACTONE) 25 MG tablet Take 1 tablet (25 mg total) by mouth daily.  30 tablet  1   No current facility-administered medications for this visit.    Allergies  Allergen Reactions  . Ace Inhibitors     REACTION: Cough    Review of Systems negative except from HPI and PMH  Physical Exam BP 150/94  Pulse 86  Ht 5\' 8"  (1.727 m)  Wt 199 lb (90.266 kg)  BMI 30.26 kg/m2 Well developed and well nourished in no  acute distress HENT normal E scleral and icterus clear Neck Supple JVP flat; carotids brisk and full Clear to ausculation  Regular rate and rhythm, no murmurs gallops or rub Soft with active bowel sounds No clubbing cyanosis none Edema Alert and oriented, grossly normal motor and sensory function Skin Warm and Dry    Assessment and  Plan

## 2012-12-07 NOTE — Assessment & Plan Note (Signed)
We will reassess left ventricular function; currently functionally class II

## 2012-12-07 NOTE — Patient Instructions (Signed)
Hysterectomy Information  A hysterectomy is a procedure where your uterus is surgically removed. It will no longer be possible to have menstrual periods or to become pregnant. The tubes and ovaries can be removed (bilateral salpingo-oopherectomy) during this surgery as well.  REASONS FOR A HYSTERECTOMY  Persistent, abnormal bleeding.  Lasting (chronic) pelvic pain or infection.  The lining of the uterus (endometrium) starts growing outside the uterus (endometriosis).  The endometrium starts growing in the muscle of the uterus (adenomyosis).  The uterus falls down into the vagina (pelvic organ prolapse).  Symptomatic uterine fibroids.  Precancerous cells.  Cervical cancer or uterine cancer. TYPES OF HYSTERECTOMIES  Supracervical hysterectomy. This type removes the top part of the uterus, but not the cervix.  Total hysterectomy. This type removes the uterus and cervix.  Radical hysterectomy. This type removes the uterus, cervix, and the fibrous tissue that holds the uterus in place in the pelvis (parametrium). WAYS A HYSTERECTOMY CAN BE PERFORMED  Abdominal hysterectomy. A large surgical cut (incision) is made in the abdomen. The uterus is removed through this incision.  Vaginal hysterectomy. An incision is made in the vagina. The uterus is removed through this incision. There are no abdominal incisions.  Conventional laparoscopic hysterectomy. A thin, lighted tube with a camera (laparoscope) is inserted into 3 or 4 small incisions in the abdomen. The uterus is cut into small pieces. The small pieces are removed through the incisions, or they are removed through the vagina.  Laparoscopic assisted vaginal hysterectomy (LAVH). Three or four small incisions are made in the abdomen. Part of the surgery is performed laparoscopically and part vaginally. The uterus is removed through the vagina.  Robot-assisted laparoscopic hysterectomy. A laparoscope is inserted into 3 or 4 small  incisions in the abdomen. A computer-controlled device is used to give the surgeon a 3D image. This allows for more precise movements of surgical instruments. The uterus is cut into small pieces and removed through the incisions or removed through the vagina. RISKS OF HYSTERECTOMY   Bleeding and risk of blood transfusion. Tell your caregiver if you do not want to receive any blood products.  Blood clots in the legs or lung.  Infection.  Injury to surrounding organs.  Anesthesia problems or side effects.  Conversion to an abdominal hysterectomy. WHAT TO EXPECT AFTER A HYSTERECTOMY  You will be given pain medicine.  You will need to have someone with you for the first 3 to 5 days after you go home.  You will need to follow up with your surgeon in 2 to 4 weeks after surgery to evaluate your progress.  You may have early menopause symptoms like hot flashes, night sweats, and insomnia.  If you had a hysterectomy for a problem that was not a cancer or a condition that could lead to cancer, then you no longer need Pap tests. However, even if you no longer need a Pap test, a regular exam is a good idea to make sure no other problems are starting. Document Released: 11/03/2000 Document Revised: 08/02/2011 Document Reviewed: 12/19/2010 [employer] Patient Information 2014 [employer].  

## 2012-12-07 NOTE — Assessment & Plan Note (Signed)
Functional status is quite good. She finds there is an easily. Her risks for surgery should be quite acceptable. She should be maintained on her carvedilol. Because of her history however of her endocardial myopathy we'll check an echo to understand   current state of LV function.

## 2012-12-07 NOTE — Assessment & Plan Note (Signed)
We'll plan to stop the Lasix. We'll continue her on Aldactone. We'll also continue on the potassium for right now. We will check a metabolic profile in 2 weeks

## 2012-12-07 NOTE — Progress Notes (Signed)
[name], female   DOB: 1965/07/30, 47 y.o.   MRN: [id number] Patient's surgery was canceled last month due to medical non-compliance and need for cardiology clearance. She is to see cardiology today, surgery is rescheduled for 01/03/13. Urged compliance prior to procedure.  Adam Phenix, MD 12/07/2012

## 2012-12-08 ENCOUNTER — Encounter: Payer: Self-pay | Admitting: Obstetrics & Gynecology

## 2012-12-13 ENCOUNTER — Inpatient Hospital Stay (HOSPITAL_COMMUNITY)
Admission: AD | Admit: 2012-12-13 | Discharge: 2012-12-13 | Disposition: A | Discharge status: Home / Self Care | Payer: Self-pay | Source: Ambulatory Visit | Attending: Obstetrics & Gynecology | Admitting: Obstetrics & Gynecology

## 2012-12-13 ENCOUNTER — Encounter (HOSPITAL_COMMUNITY): Payer: Self-pay | Admitting: *Deleted

## 2012-12-13 DIAGNOSIS — E876 Hypokalemia: Secondary | ICD-10-CM | POA: Insufficient documentation

## 2012-12-13 DIAGNOSIS — R109 Unspecified abdominal pain: Secondary | ICD-10-CM | POA: Insufficient documentation

## 2012-12-13 DIAGNOSIS — D259 Leiomyoma of uterus, unspecified: Secondary | ICD-10-CM | POA: Insufficient documentation

## 2012-12-13 DIAGNOSIS — D649 Anemia, unspecified: Secondary | ICD-10-CM | POA: Insufficient documentation

## 2012-12-13 DIAGNOSIS — N920 Excessive and frequent menstruation with regular cycle: Secondary | ICD-10-CM | POA: Insufficient documentation

## 2012-12-13 DIAGNOSIS — I509 Heart failure, unspecified: Secondary | ICD-10-CM | POA: Insufficient documentation

## 2012-12-13 LAB — URINALYSIS, ROUTINE W REFLEX MICROSCOPIC
Ketones, ur: NEGATIVE mg/dL
Nitrite: NEGATIVE
Protein, ur: 30 mg/dL — AB

## 2012-12-13 LAB — CBC
MCH: 20 pg — ABNORMAL LOW (ref 26.0–34.0)
MCV: 67.6 fL — ABNORMAL LOW (ref 78.0–100.0)
Platelets: 307 10*3/uL (ref 150–400)
RDW: 17.9 % — ABNORMAL HIGH (ref 11.5–15.5)

## 2012-12-13 LAB — COMPREHENSIVE METABOLIC PANEL
ALT: 8 U/L (ref 0–35)
AST: 10 U/L (ref 0–37)
Albumin: 2.9 g/dL — ABNORMAL LOW (ref 3.5–5.2)
Calcium: 8.6 mg/dL (ref 8.4–10.5)
Creatinine, Ser: 0.72 mg/dL (ref 0.50–1.10)
GFR calc non Af Amer: >90 mL/min (ref >90)
Sodium: 140 mEq/L (ref 135–145)
Total Protein: 6.3 g/dL (ref 6.0–8.3)

## 2012-12-13 LAB — URINE MICROSCOPIC-ADD ON

## 2012-12-13 MED ORDER — OXYCODONE-ACETAMINOPHEN 5-325 MG PO TABS
2.0000 | ORAL_TABLET | Freq: Once | ORAL | Status: AC
  Administered 2012-12-13: 2 via ORAL
  Filled 2012-12-13: qty 2

## 2012-12-13 MED ORDER — MEDROXYPROGESTERONE ACETATE 5 MG PO TABS: 10.0000 mg | ORAL_TABLET | Freq: Every day | ORAL | Status: DC

## 2012-12-13 MED ORDER — OXYCODONE-ACETAMINOPHEN 5-325 MG PO TABS: 2.0000 | ORAL_TABLET | ORAL | Status: DC | PRN

## 2012-12-13 NOTE — MAU Note (Addendum)
Heavy bleeding started last night.  Soaked through overnight pad and the bedclothes twice last night, passing large clots.  Cycles irregular, bled for 4 months at one time last year, bled for a 3 month stretch this year.

## 2012-12-13 NOTE — MAU Provider Note (Signed)
History     CSN: [id number]  Arrival date and time: 12/13/12 6213   None     Chief Complaint  Patient presents with  . Vaginal Bleeding  . Abdominal Cramping   HPI RN note: Patient states she has a history of fibroids and is scheduled for a partial hysterectomy on 8-13 with Dr. Debroah Loop. States she started bleeding and having abdominal cramping last night. States she saturated her bed twice and changing pads frequently.  Pt is 47 yo female with known history of fibroids and menorrhagia last seen in the GYN clinic last week - pt is scheduled for hysterectomy 01/03/2013.  Surgery has been rescheduled due to need of cardiac clearance. Pt has been on provera 10mg  which has been keeping her bleeding under control until last night when she started having cramping and then heavy bleeding with large clots.  Pt has had hypokalemia as well. And has started her potassium.  Pt's bleeding has diminished after she took a shower this morning.  Past Medical History  Diagnosis Date  . Chronic systolic heart failure   . Hypertension   . MITRAL REGURGITATION 10/15/2008    Qualifier: Diagnosis of  By: Denyse Amass CMA, Okey Regal    . DYSLIPIDEMIA 06/08/2010    Qualifier: Diagnosis of  By: Huntley Dec, Scott    . 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 Alopecia   . 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 Noncompliance   . Tobacco abuse   . CHF (congestive heart failure)   . Fibroids   . Abnormal uterine bleeding   . Stroke     Past Surgical History  Procedure Laterality Date  . Caesarian    . Heart loop recorder    . Cesarean section  1992  1994  . Tubal ligation  1994    Family History  Problem Relation Age of Onset  . Other Neg Hx   . Cancer Maternal Grandmother     uterine  . Hypertension Sister     History  Substance Use Topics  . Smoking status: Current Every Day Smoker  -- 0.25 packs/day for 10 years    Types: Cigarettes  . Smokeless tobacco: Never Used  . Alcohol Use: No    Allergies:  Allergies  Allergen Reactions  . Ace Inhibitors     REACTION: Cough    Prescriptions prior to admission  Medication Sig Dispense Refill  . carvedilol (COREG) 3.125 MG tablet Take 1 tablet (3.125 mg total) by mouth 2 (two) times daily with a meal.  60 tablet  12  . ferrous sulfate 325 (65 FE) MG tablet Take 1 tablet (325 mg total) by mouth 2 (two) times daily.  60 tablet  4  . fish oil-omega-3 fatty acids 1000 MG capsule Take 1 capsule (1 g total) by mouth daily.  30 capsule  4  . hydrALAZINE (APRESOLINE) 25 MG tablet Take 1 tablet (25 mg total) by mouth 2 (two) times daily.  60 tablet  11  . HYDROcodone-acetaminophen (NORCO) 5-325 MG per tablet Take 1 tablet by mouth every 6 (six) hours as needed for pain.  25 tablet  0  . medroxyPROGESTERone (PROVERA) 10 MG tablet Take 1 tablet (10 mg total) by mouth daily.  40 tablet  0  . oxyCODONE-acetaminophen (PERCOCET/ROXICET) 5-325 MG per tablet Take 1 tablet by mouth every 6 (six) hours as needed for pain.  30 tablet  0  . potassium chloride SA (KLOR-CON M20) 20 MEQ tablet Take 1 tablet (20 mEq total) by mouth daily.  30 tablet  5  . pravastatin (PRAVACHOL) 20 MG tablet Take 1 tablet (20 mg total) by mouth daily.  30 tablet  4  . spironolactone (ALDACTONE) 25 MG tablet Take 1 tablet (25 mg total) by mouth daily.  30 tablet  1    Review of Systems  Constitutional: Negative for fever and chills.  Eyes: Negative for blurred vision.  Gastrointestinal: Positive for abdominal pain. Negative for nausea, vomiting, diarrhea and constipation.  Genitourinary: Negative for dysuria.  Neurological: Positive for dizziness.   Physical Exam   Height 5\' 6"  (1.676 m), weight 91.354 kg (201 lb 6.4 oz).  Physical Exam  Nursing note and vitals reviewed. Constitutional: She is oriented to person, place, and time. She appears well-developed  and well-nourished. No distress.  HENT:  Head: Normocephalic.  Eyes: Pupils are equal, round, and reactive to light.  Neck: Normal range of motion. Neck supple.  Cardiovascular: Normal rate.   Respiratory: Effort normal.  GI: Soft. She exhibits no distension. There is no tenderness. There is no rebound and no guarding.  Genitourinary:  Small amount of bright red blood from os  Musculoskeletal: Normal range of motion.  Neurological: She is alert and oriented to person, place, and time.  Skin: Skin is warm and dry.  Psychiatric: She has a normal mood and affect.    MAU Course  Procedures Discussed with Dr. Debroah Loop- will increase medroxyprogesterone to 10mg  BID until bleeding stops then one daily Results for orders placed during the hospital encounter of 12/13/12 (from the past 24 hour(s))  URINALYSIS, ROUTINE W REFLEX MICROSCOPIC     Status: Abnormal   Collection Time    12/13/12  8:10 AM      Result Value Range   Color, Urine YELLOW  YELLOW   APPearance HAZY (*) CLEAR   Specific Gravity, Urine >1.030 (*) 1.005 - 1.030   pH 6.0  5.0 - 8.0   Glucose, UA NEGATIVE  NEGATIVE mg/dL   Hgb urine dipstick LARGE (*) NEGATIVE   Bilirubin Urine NEGATIVE  NEGATIVE   Ketones, ur NEGATIVE  NEGATIVE mg/dL   Protein, ur 30 (*) NEGATIVE mg/dL   Urobilinogen, UA 0.2  0.0 - 1.0 mg/dL   Nitrite NEGATIVE  NEGATIVE   Leukocytes, UA TRACE (*) NEGATIVE  URINE MICROSCOPIC-ADD ON     Status: None   Collection Time    12/13/12  8:10 AM      Result Value Range   Squamous Epithelial / LPF RARE  RARE   WBC, UA 0-2  <3 WBC/hpf   RBC / HPF 21-50  <3 RBC/hpf   Bacteria, UA RARE  RARE  POCT PREGNANCY, URINE     Status: None   Collection Time    12/13/12  8:19 AM      Result Value Range   Preg Test, Ur NEGATIVE  NEGATIVE  CBC     Status: Abnormal   Collection Time    12/13/12  8:45 AM      Result Value Range   WBC 5.0  4.0 - 10.5 K/uL   RBC 4.04  3.87 - 5.11 MIL/uL   Hemoglobin 8.1 (*) 12.0 - 15.0  g/dL   HCT 09.8 (*) 11.9 - 14.7 %   MCV 67.6 (*) 78.0 - 100.0 fL   MCH 20.0 (*) 26.0 - 34.0 pg   MCHC 29.7 (*) 30.0 -  36.0 g/dL   RDW 16.1 (*) 09.6 - 04.5 %   Platelets 307  150 - 400 K/uL  COMPREHENSIVE METABOLIC PANEL     Status: Abnormal   Collection Time    12/13/12  8:45 AM      Result Value Range   Sodium 140  135 - 145 mEq/L   Potassium 2.8 (*) 3.5 - 5.1 mEq/L   Chloride 105  96 - 112 mEq/L   CO2 26  19 - 32 mEq/L   Glucose, Bld 123 (*) 70 - 99 mg/dL   BUN 8  6 - 23 mg/dL   Creatinine, Ser 4.09  0.50 - 1.10 mg/dL   Calcium 8.6  8.4 - 81.1 mg/dL   Total Protein 6.3  6.0 - 8.3 g/dL   Albumin 2.9 (*) 3.5 - 5.2 g/dL   AST 10  0 - 37 U/L   ALT 8  0 - 35 U/L   Alkaline Phosphatase 81  39 - 117 U/L   Total Bilirubin 0.3  0.3 - 1.2 mg/dL   GFR calc non Af Amer >90  >90 mL/min   GFR calc Af Amer >90  >90 mL/min    Assessment and Plan  Menorrhagia -provera 10mg  one 2 times daily until bleeding stops then one daiily #40 Percocet #15 for cramping Fibroids- scheduled for hyst- keep clinic appt Anemia- continue iron supplement Hypokalemia- continue K supplement- f/u with Dr. Graciela Husbands CVD/CHF- f/u with Dr. Guilford Shi 12/13/2012, 8:21 AM

## 2012-12-13 NOTE — MAU Note (Signed)
Patient states she has a history of fibroids and is scheduled for a partial hysterectomy on 8-13 with Dr. Debroah Loop. States she started bleeding and having abdominal cramping last night. States she saturated her bed twice and changing pads frequently.

## 2012-12-14 ENCOUNTER — Encounter: Payer: Self-pay | Admitting: *Deleted

## 2012-12-15 ENCOUNTER — Encounter (HOSPITAL_COMMUNITY): Payer: Self-pay | Admitting: Registered Nurse

## 2012-12-15 ENCOUNTER — Telehealth: Payer: Self-pay | Admitting: Internal Medicine

## 2012-12-15 NOTE — Telephone Encounter (Signed)
7/23  Called pt to schedule Echo no answer no voice mail.  Will try again.   OB  7/24 still no answer left vm.    OB     7/25 Left voice mail to return call to schedule Echo.   OB

## 2012-12-21 ENCOUNTER — Other Ambulatory Visit (HOSPITAL_COMMUNITY): Payer: Self-pay

## 2012-12-21 ENCOUNTER — Other Ambulatory Visit: Payer: Self-pay

## 2012-12-22 ENCOUNTER — Other Ambulatory Visit: Payer: Self-pay

## 2012-12-25 ENCOUNTER — Other Ambulatory Visit: Payer: Self-pay

## 2013-01-03 ENCOUNTER — Encounter (HOSPITAL_COMMUNITY): Admission: RE | Payer: Self-pay | Source: Ambulatory Visit

## 2013-01-03 ENCOUNTER — Inpatient Hospital Stay (HOSPITAL_COMMUNITY): Admission: RE | Admit: 2013-01-03 | Payer: Self-pay | Source: Ambulatory Visit | Admitting: Obstetrics & Gynecology

## 2013-01-03 SURGERY — HYSTERECTOMY, ABDOMINAL
Anesthesia: Choice | Site: Abdomen

## 2013-01-18 ENCOUNTER — Ambulatory Visit (INDEPENDENT_AMBULATORY_CARE_PROVIDER_SITE_OTHER): Payer: Self-pay | Admitting: Obstetrics & Gynecology

## 2013-01-18 ENCOUNTER — Encounter: Payer: Self-pay | Admitting: Obstetrics & Gynecology

## 2013-01-18 VITALS — BP 127/86 | HR 91 | Temp 99.1°F | Ht 68.0 in | Wt 199.0 lb

## 2013-01-18 DIAGNOSIS — N920 Excessive and frequent menstruation with regular cycle: Secondary | ICD-10-CM

## 2013-01-18 NOTE — Progress Notes (Signed)
[name], female   DOB: 22-Aug-1965, 47 y.o.   MRN: [id number]  Chief Complaint  Patient presents with  . Advice Only    HPI Chelsea Branch is a 47 y.o. female.  Patient did not show up and surgery was cancelled 8/13. Followed by cardiology, her K is low.  Only spotting on Provera now.  HPI  Past Medical History  Diagnosis Date  . Chronic systolic heart failure   . Hypertension   . MITRAL REGURGITATION 10/15/2008    Qualifier: Diagnosis of  By: Denyse Amass CMA, Okey Regal    . DYSLIPIDEMIA 06/08/2010    Qualifier: Diagnosis of  By: Huntley Dec, Scott    . 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 Alopecia   . 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 Noncompliance   . Tobacco abuse   . CHF (congestive heart failure)   . Fibroids   . Abnormal uterine bleeding   . Stroke     Past Surgical History  Procedure Laterality Date  . Caesarian    . Heart loop recorder    . Cesarean section  1992  1994  . Tubal ligation  1994    Family History  Problem Relation Age of Onset  . Other Neg Hx   . Cancer Maternal Grandmother     uterine  . Hypertension Sister     Social History History  Substance Use Topics  . Smoking status: Current Every Day Smoker -- 0.25 packs/day for 10 years    Types: Cigarettes  . Smokeless tobacco: Never Used  . Alcohol Use: No    Allergies  Allergen Reactions  . Ace Inhibitors     REACTION: Cough    Current Outpatient Prescriptions  Medication Sig Dispense Refill  . carvedilol (COREG) 3.125 MG tablet Take 1 tablet (3.125 mg total) by mouth 2 (two) times daily with a meal.  60 tablet  12  . ferrous sulfate 325 (65 FE) MG tablet Take 1 tablet (325 mg total) by mouth 2 (two) times daily.  60 tablet  4  . hydrALAZINE (APRESOLINE) 25 MG tablet Take 1 tablet (25 mg total) by mouth 2 (two) times daily.  60 tablet  11  .  medroxyPROGESTERone (PROVERA) 5 MG tablet Take 2 tablets (10 mg total) by mouth daily. Until stop bleeding then one daily  40 tablet  0  . potassium chloride SA (KLOR-CON M20) 20 MEQ tablet Take 1 tablet (20 mEq total) by mouth daily.  30 tablet  5  . pravastatin (PRAVACHOL) 20 MG tablet Take 1 tablet (20 mg total) by mouth daily.  30 tablet  4  . spironolactone (ALDACTONE) 25 MG tablet Take 1 tablet (25 mg total) by mouth daily.  30 tablet  1  . fish oil-omega-3 fatty acids 1000 MG capsule Take 1 capsule (1 g total) by mouth daily.  30 capsule  4  . HYDROcodone-acetaminophen (NORCO) 5-325 MG per tablet Take 1 tablet by mouth every 6 (six) hours as needed for pain.  25 tablet  0  . oxyCODONE-acetaminophen (PERCOCET/ROXICET) 5-325 MG per tablet Take 2 tablets by mouth every 4 (four) hours as needed for pain.  15 tablet  0   No current facility-administered medications for this visit.    Review of Systems Review of Systems  Gastrointestinal: Positive for abdominal pain and abdominal distention.  Genitourinary: Positive for vaginal bleeding  and pelvic pain. Decreased urine volume: spotting.    Blood pressure 127/86, pulse 91, temperature 99.1 F (37.3 C), temperature source Oral, height 5\' 8"  (1.727 m), weight 199 lb (90.266 kg).  Physical Exam Physical Exam  Constitutional: No distress.  Psychiatric: She has a normal mood and affect. Her behavior is normal.    Data Reviewed [place], biopsy, notes  Assessment    Fibroid, heart and BP problems, poor compliance, menorrhagia     Plan    Applying for Medicaid. Willing to try Mirena. Would consider referral to tertiary care center if she needs hysterectomy in future.        ARNOLD,JAMES 01/18/2013, 3:44 PM

## 2013-01-18 NOTE — Patient Instructions (Signed)
Levonorgestrel intrauterine device (IUD) What is this medicine? LEVONORGESTREL IUD (LEE voe nor jes trel) is a contraceptive (birth control) device. The device is placed inside the uterus by a healthcare professional. It is used to prevent pregnancy and can also be used to treat heavy bleeding that occurs during your period. Depending on the device, it can be used for 3 to 5 years. This medicine may be used for other purposes; ask your health care provider or pharmacist if you have questions. What should I tell my health care provider before I take this medicine? They need to know if you have any of these conditions: -abnormal Pap smear -cancer of the breast, uterus, or cervix -diabetes -endometritis -genital or pelvic infection now or in the past -have more than one sexual partner or your partner has more than one partner -heart disease -history of an ectopic or tubal pregnancy -immune system problems -IUD in place -liver disease or tumor -problems with blood clots or take blood-thinners -use intravenous drugs -uterus of unusual shape -vaginal bleeding that has not been explained -an unusual or allergic reaction to levonorgestrel, other hormones, silicone, or polyethylene, medicines, foods, dyes, or preservatives -pregnant or trying to get pregnant -breast-feeding How should I use this medicine? This device is placed inside the uterus by a health care professional. Talk to your pediatrician regarding the use of this medicine in children. Special care may be needed. Overdosage: If you think you have taken too much of this medicine contact a poison control center or emergency room at once. NOTE: This medicine is only for you. Do not share this medicine with others. What if I miss a dose? This does not apply. What may interact with this medicine? Do not take this medicine with any of the following medications: -amprenavir -bosentan -fosamprenavir This medicine may also interact with  the following medications: -aprepitant -barbiturate medicines for inducing sleep or treating seizures -bexarotene -griseofulvin -medicines to treat seizures like carbamazepine, ethotoin, felbamate, oxcarbazepine, phenytoin, topiramate -modafinil -pioglitazone -rifabutin -rifampin -rifapentine -some medicines to treat HIV infection like atazanavir, indinavir, lopinavir, nelfinavir, tipranavir, ritonavir -St. John's wort -warfarin This list may not describe all possible interactions. Give your health care provider a list of all the medicines, herbs, non-prescription drugs, or dietary supplements you use. Also tell them if you smoke, drink alcohol, or use illegal drugs. Some items may interact with your medicine. What should I watch for while using this medicine? Visit your doctor or health care professional for regular check ups. See your doctor if you or your partner has sexual contact with others, becomes HIV positive, or gets a sexual transmitted disease. This product does not protect you against HIV infection (AIDS) or other sexually transmitted diseases. You can check the placement of the IUD yourself by reaching up to the top of your vagina with clean fingers to feel the threads. Do not pull on the threads. It is a good habit to check placement after each menstrual period. Call your doctor right away if you feel more of the IUD than just the threads or if you cannot feel the threads at all. The IUD may come out by itself. You may become pregnant if the device comes out. If you notice that the IUD has come out use a backup birth control method like condoms and call your health care provider. Using tampons will not change the position of the IUD and are okay to use during your period. What side effects may I notice from receiving this medicine?   Side effects that you should report to your doctor or health care professional as soon as possible: -allergic reactions like skin rash, itching or  hives, swelling of the face, lips, or tongue -fever, flu-like symptoms -genital sores -high blood pressure -no menstrual period for 6 weeks during use -pain, swelling, warmth in the leg -pelvic pain or tenderness -severe or sudden headache -signs of pregnancy -stomach cramping -sudden shortness of breath -trouble with balance, talking, or walking -unusual vaginal bleeding, discharge -yellowing of the eyes or skin Side effects that usually do not require medical attention (report to your doctor or health care professional if they continue or are bothersome): -acne -breast pain -change in sex drive or performance -changes in weight -cramping, dizziness, or faintness while the device is being inserted -headache -irregular menstrual bleeding within first 3 to 6 months of use -nausea This list may not describe all possible side effects. Call your doctor for medical advice about side effects. You may report side effects to FDA at 1-800-FDA-1088. Where should I keep my medicine? This does not apply. NOTE: This sheet is a summary. It may not cover all possible information. If you have questions about this medicine, talk to your doctor, pharmacist, or health care provider.  2013, [employer]vier/Gold Standard. (06/10/2011 1:54:04 PM)  

## 2013-01-23 ENCOUNTER — Telehealth: Payer: Self-pay | Admitting: *Deleted

## 2013-01-23 DIAGNOSIS — N938 Other specified abnormal uterine and vaginal bleeding: Secondary | ICD-10-CM

## 2013-01-23 NOTE — Telephone Encounter (Signed)
Chelsea Branch called and states she saw Dr. Debroah Loop last week and he was supposed to send in a medicine to her pharmacy, but she called [facility] at [facility] and they did not get it. Requests a call. Called [name] and she states it was medroxyprogesterone. States she needs it now because she has started bleeding again and doesn't want to have to come to the hospital. Informed her Dr. Debroah Loop not here, but we should be able to get it refilled today or tomorrow probably and we will let her know when we do.

## 2013-01-24 MED ORDER — MEDROXYPROGESTERONE ACETATE 5 MG PO TABS: 10.0000 mg | ORAL_TABLET | Freq: Every day | ORAL | Status: DC

## 2013-01-24 NOTE — Telephone Encounter (Signed)
Discussed with Dr Debroah Loop. Refill authorized. Called Alydia and left message we are calling to notify her that her prescription refill has been approved and sent to her pharmacy with refills. Please call if you have other questions or concerns.

## 2013-01-29 ENCOUNTER — Telehealth: Payer: Self-pay | Admitting: Internal Medicine

## 2013-01-29 NOTE — Telephone Encounter (Signed)
Can't afford the Pravastatin and  Potassium.

## 2013-02-05 NOTE — Telephone Encounter (Signed)
Talked with patient about resources for assist. Referred her to MAP program

## 2013-03-21 ENCOUNTER — Encounter: Payer: Self-pay | Admitting: Obstetrics & Gynecology

## 2013-03-21 ENCOUNTER — Ambulatory Visit (INDEPENDENT_AMBULATORY_CARE_PROVIDER_SITE_OTHER): Payer: Medicaid Other | Admitting: Obstetrics & Gynecology

## 2013-03-21 VITALS — BP 128/78 | HR 88 | Temp 98.3°F | Wt 195.0 lb

## 2013-03-21 DIAGNOSIS — N949 Unspecified condition associated with female genital organs and menstrual cycle: Secondary | ICD-10-CM

## 2013-03-21 DIAGNOSIS — N938 Other specified abnormal uterine and vaginal bleeding: Secondary | ICD-10-CM

## 2013-03-21 DIAGNOSIS — D219 Benign neoplasm of connective and other soft tissue, unspecified: Secondary | ICD-10-CM

## 2013-03-21 DIAGNOSIS — Z01812 Encounter for preprocedural laboratory examination: Secondary | ICD-10-CM

## 2013-03-21 DIAGNOSIS — D259 Leiomyoma of uterus, unspecified: Secondary | ICD-10-CM

## 2013-03-21 DIAGNOSIS — Z23 Encounter for immunization: Secondary | ICD-10-CM

## 2013-03-21 LAB — POCT PREGNANCY, URINE
Preg Test, Ur: NEGATIVE
Preg Test, Ur: NEGATIVE

## 2013-03-21 MED ORDER — LEVONORGESTREL 20 MCG/24HR IU IUD
INTRAUTERINE_SYSTEM | Freq: Once | INTRAUTERINE | Status: AC
  Administered 2013-03-21: 15:00:00 via INTRAUTERINE

## 2013-03-21 NOTE — Progress Notes (Signed)
  Subjective:    [name], female    DOB: [birth year], 47 y.o.   MRN: [id number]  HPI  Chelsea Branch is a 47 yo lady generally followed by Dr. Debroah Loop for her know fibroids, DUB, and pelvic pain. She is here today for Mirena insertion.   Review of Systems     Objective:   Physical Exam   UPT negative, consent signed, Time out procedure done. Cervix prepped with betadine and grasped with a single tooth tenaculum. Mirena was easily placed and the strings were cut to 3-4 cm. Uterus sounded to 9 cm. She tolerated the procedure well.      Assessment & Plan:  DUB/fibroids- Mirena placed today RTC 3 months for follow up Preventative care- flu vaccine today

## 2013-03-21 NOTE — Patient Instructions (Signed)
Intrauterine Device Insertion Care After Refer to this sheet in the next few weeks. These instructions provide you with information on caring for yourself after your procedure. Your caregiver may also give you more specific instructions. Your treatment has been planned according to current medical practices, but problems sometimes occur. Call your caregiver if you have any problems or questions after your procedure. HOME CARE INSTRUCTIONS   Only take over-the-counter or prescription medicines for pain, discomfort, or fever as directed by your caregiver. Do not use aspirin. This may increase bleeding.  Check your IUD to make sure it is in place before you resume sexual activity. You should be able to feel the strings. If you cannot feel the strings, something may be wrong. The IUD may have fallen out of the uterus, or the uterus may have been punctured (perforated) during placement. Also, if the strings are getting longer, it may mean that the IUD is being forced out of the uterus. You no longer have full protection from pregnancy if any of these problems occur.  You may resume sexual intercourse if you are not having problems with the IUD. The IUD is considered immediately effective.  You may resume normal activities.  Keep all follow-up appointments to be sure your IUD has remained in place. After the first exam, yearly exams are advised, unless you cannot feel the strings of your IUD.  Continue to check that the IUD is still in place by feeling for the strings after every menstrual period. SEEK MEDICAL CARE IF:   You have bleeding that is heavier or lasts longer than a normal menstrual cycle.  You have a fever.  You have increasing cramps or abdominal pain not relieved with medicine.  You have abdominal pain that does not seem to be related to the same area of earlier cramping and pain.  You are lightheaded, unusually weak, or faint.  You have abnormal vaginal discharge or  smells.  You have pain during sexual intercourse.  You cannot feel the IUD strings, or the IUD string has gotten longer.  You feel the IUD at the opening of the cervix in the vagina.  You think you are pregnant, or you miss your menstrual period.  The IUD string is hurting your sex partner. Document Released: 01/06/2011 Document Revised: 08/02/2011 Document Reviewed: 01/06/2011 ExitCare Patient Information 2014 [employer].  

## 2013-03-28 ENCOUNTER — Encounter: Payer: Self-pay | Admitting: *Deleted

## 2013-04-13 ENCOUNTER — Other Ambulatory Visit: Payer: Self-pay | Admitting: Cardiovascular Disease

## 2013-04-13 ENCOUNTER — Other Ambulatory Visit: Payer: Self-pay | Admitting: Internal Medicine

## 2013-04-13 ENCOUNTER — Other Ambulatory Visit: Payer: Self-pay

## 2013-04-13 MED ORDER — CARVEDILOL 3.125 MG PO TABS: 3.1250 mg | ORAL_TABLET | Freq: Two times a day (BID) | ORAL | Status: DC

## 2013-04-13 MED ORDER — PRAVASTATIN SODIUM 20 MG PO TABS: 20.0000 mg | ORAL_TABLET | Freq: Every day | ORAL | Status: DC

## 2013-04-13 MED ORDER — HYDRALAZINE HCL 25 MG PO TABS: 25.0000 mg | ORAL_TABLET | Freq: Two times a day (BID) | ORAL | Status: DC

## 2013-04-13 MED ORDER — POTASSIUM CHLORIDE CRYS ER 20 MEQ PO TBCR: 20.0000 meq | EXTENDED_RELEASE_TABLET | Freq: Every day | ORAL | Status: DC

## 2013-04-13 MED ORDER — SPIRONOLACTONE 25 MG PO TABS: 25.0000 mg | ORAL_TABLET | Freq: Every day | ORAL | Status: DC

## 2013-05-03 ENCOUNTER — Telehealth: Payer: Self-pay | Admitting: General Practice

## 2013-05-03 NOTE — Telephone Encounter (Signed)
Called Chelsea Branch and she states she is spotting and was that normal. We discussed irregular bleeding/spotting is normal as your body adjusts to the hormones in the Mirena and for most people it gets much better by 6 months and at that time she may not have any  Periods/ or bleeding. [name] voices understanding

## 2013-05-03 NOTE — Telephone Encounter (Signed)
Patient called and left message stating she had a Mirena put in in October and she has some concerns and would like a call back. Called patient, no answer- left message stating we are trying to return your phone call, please call us back at the clinics

## 2013-05-24 DIAGNOSIS — G4730 Sleep apnea, unspecified: Secondary | ICD-10-CM

## 2013-05-24 HISTORY — DX: Sleep apnea, unspecified: G47.30

## 2013-05-25 ENCOUNTER — Other Ambulatory Visit: Payer: Self-pay | Admitting: Obstetrics & Gynecology

## 2013-05-29 ENCOUNTER — Telehealth: Payer: Self-pay

## 2013-05-29 NOTE — Telephone Encounter (Signed)
Pt. Called stating she thinks she has a problem with her uterus, her underwear gets real wet when she urinates and has pinkish discharge with cramping in her right side and back pain. Would like a call back.

## 2013-05-29 NOTE — Telephone Encounter (Signed)
Called pt. And left message stating we are returning your call please call clinic.  

## 2013-05-30 NOTE — Telephone Encounter (Signed)
Called pt. And asked her if she was still having concerns. Pt. Stated she had been having some pinkish discharge for about a week but it stopped yesterday. Stated she was also having some cramping and back pain during this time. Informed patient it sounds like it could have been her period and she was simply spotting. Pt. Stated "yeah that's probably right." Pt. Asked about Ibuprofen 800mg  tabs that Dr.Arnold said he would order for her intermittent pain. Informed pt. It appears it was ordered on 05/25/13 and is ready at her [place]. Pt. Verbalized understanding and gratitude and had no other questions or concerns.

## 2013-06-15 ENCOUNTER — Emergency Department (HOSPITAL_COMMUNITY)
Admission: EM | Admit: 2013-06-15 | Discharge: 2013-06-15 | Disposition: A | Discharge status: Home / Self Care | Payer: Medicaid Other | Attending: Emergency Medicine | Admitting: Emergency Medicine

## 2013-06-15 ENCOUNTER — Encounter (HOSPITAL_COMMUNITY): Payer: Self-pay | Admitting: Emergency Medicine

## 2013-06-15 DIAGNOSIS — Z8673 Personal history of transient ischemic attack (TIA), and cerebral infarction without residual deficits: Secondary | ICD-10-CM | POA: Insufficient documentation

## 2013-06-15 DIAGNOSIS — Z79899 Other long term (current) drug therapy: Secondary | ICD-10-CM | POA: Insufficient documentation

## 2013-06-15 DIAGNOSIS — T7840XA Allergy, unspecified, initial encounter: Secondary | ICD-10-CM

## 2013-06-15 DIAGNOSIS — Z8742 Personal history of other diseases of the female genital tract: Secondary | ICD-10-CM | POA: Insufficient documentation

## 2013-06-15 DIAGNOSIS — L509 Urticaria, unspecified: Secondary | ICD-10-CM | POA: Insufficient documentation

## 2013-06-15 DIAGNOSIS — Z9119 Patient's noncompliance with other medical treatment and regimen: Secondary | ICD-10-CM | POA: Insufficient documentation

## 2013-06-15 DIAGNOSIS — E785 Hyperlipidemia, unspecified: Secondary | ICD-10-CM | POA: Insufficient documentation

## 2013-06-15 DIAGNOSIS — T4995XA Adverse effect of unspecified topical agent, initial encounter: Secondary | ICD-10-CM | POA: Insufficient documentation

## 2013-06-15 DIAGNOSIS — Q054 Unspecified spina bifida with hydrocephalus: Secondary | ICD-10-CM | POA: Insufficient documentation

## 2013-06-15 DIAGNOSIS — Z91199 Patient's noncompliance with other medical treatment and regimen due to unspecified reason: Secondary | ICD-10-CM | POA: Insufficient documentation

## 2013-06-15 DIAGNOSIS — I10 Essential (primary) hypertension: Secondary | ICD-10-CM | POA: Insufficient documentation

## 2013-06-15 DIAGNOSIS — I5022 Chronic systolic (congestive) heart failure: Secondary | ICD-10-CM | POA: Insufficient documentation

## 2013-06-15 DIAGNOSIS — F17200 Nicotine dependence, unspecified, uncomplicated: Secondary | ICD-10-CM | POA: Insufficient documentation

## 2013-06-15 DIAGNOSIS — L659 Nonscarring hair loss, unspecified: Secondary | ICD-10-CM | POA: Insufficient documentation

## 2013-06-15 MED ORDER — FAMOTIDINE 20 MG PO TABS
20.0000 mg | ORAL_TABLET | Freq: Once | ORAL | Status: AC
  Administered 2013-06-15: 20 mg via ORAL
  Filled 2013-06-15: qty 1

## 2013-06-15 MED ORDER — DIPHENHYDRAMINE HCL 25 MG PO CAPS
25.0000 mg | ORAL_CAPSULE | Freq: Once | ORAL | Status: AC
  Administered 2013-06-15: 25 mg via ORAL
  Filled 2013-06-15: qty 1

## 2013-06-15 NOTE — ED Provider Notes (Signed)
Medical screening examination/treatment/procedure(s) were performed by non-physician practitioner and as supervising physician I was immediately available for consultation/collaboration.     Veryl Speak, MD 06/15/13 2340

## 2013-06-15 NOTE — ED Provider Notes (Signed)
CSN: [id number]     Arrival date & time 06/15/13  2003 History  This chart was scribed for non-physician practitioner Noland Fordyce, PA-C working with Veryl Speak, MD by Eston Mould, ED Scribe. This patient was seen in room TR11C/TR11C and the patient's care was started at 8:38 PM .   Chief Complaint  Patient presents with  . Urticaria   The history is provided by the patient. No language interpreter was used.   HPI Comments: Chelsea Branch is a 48 y.o. female who presents to the Emergency Department complaining of ongoing rash to face and upper body that began today. Pt states she ate seafood this evening for lunch and shortly after began having a rash appear on her hands that later spread to face, neck, torso, and back area. She states she "has not been feeling well since she took a shower". Pt is unsure if she is allergic to seafood and states she has never had an allergic reaction before. Pt states she generally eats fish without an allergic reaction. Pt denies SOB or having trouble breathing. Pt states she has a PCP. Denies taking medication PTA.  Past Medical History  Diagnosis Date  . Chronic systolic heart failure   . Hypertension   . MITRAL REGURGITATION 10/15/2008    Qualifier: Diagnosis of  By: Orville Govern CMA, Arbie Cookey    . DYSLIPIDEMIA 06/08/2010    Qualifier: Diagnosis of  By: Jorene Minors, Scott    . 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 Alopecia   . 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 Noncompliance   . Tobacco abuse   . CHF (congestive heart failure)   . Fibroids   . Abnormal uterine bleeding   . Stroke    Past Surgical History  Procedure Laterality Date  . Caesarian    . Heart loop recorder    . Cesarean section  1992  1994  . Tubal ligation  1994   Family History  Problem Relation Age of Onset  . Other Neg Hx   . Cancer Maternal  Grandmother     uterine  . Hypertension Sister    History  Substance Use Topics  . Smoking status: Current Every Day Smoker -- 0.25 packs/day for 10 years    Types: Cigarettes  . Smokeless tobacco: Never Used  . Alcohol Use: No   OB History   Grav Para Term Preterm Abortions TAB SAB Ect Mult Living   2 2 2      1 3      Review of Systems  Respiratory: Negative for chest tightness and shortness of breath.   Skin: Negative for rash.  All other systems reviewed and are negative.   Allergies  Ace inhibitors  Home Medications   Current Outpatient Rx  Name  Route  Sig  Dispense  Refill  . carvedilol (COREG) 3.125 MG tablet   Oral   Take 1 tablet (3.125 mg total) by mouth 2 (two) times daily with a meal.   180 tablet   3   . hydrALAZINE (APRESOLINE) 25 MG tablet   Oral   Take 1 tablet (25 mg total) by mouth 2 (two) times daily.   180 tablet   3   . ibuprofen (ADVIL,MOTRIN) 800 MG tablet      TAKE ONE TABLET BY MOUTH EVERY 8 HOURS AS NEEDED FOR PAIN   60 tablet  0   . potassium chloride SA (KLOR-CON M20) 20 MEQ tablet   Oral   Take 1 tablet (20 mEq total) by mouth daily.   90 tablet   3   . pravastatin (PRAVACHOL) 20 MG tablet   Oral   Take 1 tablet (20 mg total) by mouth daily.   90 tablet   4    BP 160/93  Pulse 93  Temp(Src) 98 F (36.7 C) (Oral)  Resp 18  Ht 5\' 8"  (1.727 m)  Wt 198 lb (89.812 kg)  BMI 30.11 kg/m2  SpO2 100%  Physical Exam  Nursing note and vitals reviewed. Constitutional: She is oriented to person, place, and time. She appears well-developed and well-nourished.  HENT:  Head: Normocephalic and atraumatic.  No angioedema. No tongue swelling  Eyes: EOM are normal.  Neck: Normal range of motion.  Cardiovascular: Normal rate.   Pulmonary/Chest: Effort normal.  No respitory distress.   Musculoskeletal: Normal range of motion.  Neurological: She is alert and oriented to person, place, and time.  Skin: Skin is warm and dry.   Diffused hives on face, neck, torso, and back.   Psychiatric: She has a normal mood and affect. Her behavior is normal.    ED Course  Procedures  DIAGNOSTIC STUDIES: Oxygen Saturation is 100% on RA, normal by my interpretation.    COORDINATION OF CARE: 8:42 PM-Discussed treatment plan which includes administer Benadryl to pt while in ED. Will discharge pt with medications. Advised pt to F/U with PCP. Pt agreed to plan.   Labs Review Labs Reviewed - No data to display Imaging Review No results found.  EKG Interpretation   None      MDM   1. Hives   2. Allergic reaction    Pt has diffuse mild hives, no evidence of respiratory distress.  No evidence of anaphylactic shock. Will give benadryl and pepcid. Advised to continue taking benadryl every 4-6 hours as needed for rash. Return precautions provided. Pt verbalized understanding and agreement with tx plan.   I personally performed the services described in this documentation, which was scribed in my presence. The recorded information has been reviewed and is accurate.    Noland Fordyce, PA-C 06/15/13 2109

## 2013-06-15 NOTE — Discharge Instructions (Signed)
You may take over the counter benadryl every 4-6 hours as needed for itching and rash. Be sure to follow up with primary care as needed for ongoing healthcare needs. Return to ER immediately if you develop tongue or throat swelling, or difficulty breathing.

## 2013-06-15 NOTE — ED Notes (Signed)
Pt. reports itchy rahes/welts at torso and upper extremities after eating fish this afternoon . Airway intact / respirations unlabored .

## 2013-07-28 ENCOUNTER — Encounter (HOSPITAL_COMMUNITY): Payer: Self-pay | Admitting: *Deleted

## 2013-07-28 ENCOUNTER — Inpatient Hospital Stay (HOSPITAL_COMMUNITY)
Admission: AD | Admit: 2013-07-28 | Discharge: 2013-07-28 | Disposition: A | Discharge status: Home / Self Care | Payer: Medicaid Other | Source: Ambulatory Visit | Attending: Obstetrics & Gynecology | Admitting: Obstetrics & Gynecology

## 2013-07-28 DIAGNOSIS — F17200 Nicotine dependence, unspecified, uncomplicated: Secondary | ICD-10-CM | POA: Diagnosis not present

## 2013-07-28 DIAGNOSIS — R109 Unspecified abdominal pain: Secondary | ICD-10-CM | POA: Diagnosis not present

## 2013-07-28 DIAGNOSIS — M545 Low back pain, unspecified: Secondary | ICD-10-CM | POA: Diagnosis not present

## 2013-07-28 DIAGNOSIS — I5022 Chronic systolic (congestive) heart failure: Secondary | ICD-10-CM | POA: Diagnosis not present

## 2013-07-28 DIAGNOSIS — I509 Heart failure, unspecified: Secondary | ICD-10-CM | POA: Insufficient documentation

## 2013-07-28 DIAGNOSIS — N898 Other specified noninflammatory disorders of vagina: Secondary | ICD-10-CM

## 2013-07-28 DIAGNOSIS — I428 Other cardiomyopathies: Secondary | ICD-10-CM | POA: Diagnosis not present

## 2013-07-28 DIAGNOSIS — I10 Essential (primary) hypertension: Secondary | ICD-10-CM | POA: Diagnosis not present

## 2013-07-28 LAB — URINALYSIS, ROUTINE W REFLEX MICROSCOPIC
BILIRUBIN URINE: NEGATIVE
GLUCOSE, UA: NEGATIVE mg/dL
HGB URINE DIPSTICK: NEGATIVE
Ketones, ur: NEGATIVE mg/dL
Nitrite: NEGATIVE
PROTEIN: NEGATIVE mg/dL
UROBILINOGEN UA: 0.2 mg/dL (ref 0.0–1.0)
pH: 6.5 (ref 5.0–8.0)

## 2013-07-28 LAB — POCT PREGNANCY, URINE: Preg Test, Ur: NEGATIVE

## 2013-07-28 LAB — URINE MICROSCOPIC-ADD ON

## 2013-07-28 LAB — WET PREP, GENITAL
CLUE CELLS WET PREP: NONE SEEN
Trich, Wet Prep: NONE SEEN
YEAST WET PREP: NONE SEEN

## 2013-07-28 NOTE — MAU Note (Signed)
Pt states had Mirena placed in November, has hx fibroids, heart Dr didn't want pt to have surgery to remove them. About a week ago was voiding and saw string from IUD come out. Having a lot of back pain and felt like something internally shifted. Feels like pins in her back. Has heavy discharge, does note odor.

## 2013-07-28 NOTE — Discharge Instructions (Signed)
Abdominal Pain, Women °Abdominal (stomach, pelvic, or belly) pain can be caused by many things. It is important to tell your doctor: °· The location of the pain. °· Does it come and go or is it present all the time? °· Are there things that start the pain (eating certain foods, exercise)? °· Are there other symptoms associated with the pain (fever, nausea, vomiting, diarrhea)? °All of this is helpful to know when trying to find the cause of the pain. °CAUSES  °· Stomach: virus or bacteria infection, or ulcer. °· Intestine: appendicitis (inflamed appendix), regional ileitis (Crohn's disease), ulcerative colitis (inflamed colon), irritable bowel syndrome, diverticulitis (inflamed diverticulum of the colon), or cancer of the stomach or intestine. °· Gallbladder disease or stones in the gallbladder. °· Kidney disease, kidney stones, or infection. °· Pancreas infection or cancer. °· Fibromyalgia (pain disorder). °· Diseases of the female organs: °· Uterus: fibroid (non-cancerous) tumors or infection. °· Fallopian tubes: infection or tubal pregnancy. °· Ovary: cysts or tumors. °· Pelvic adhesions (scar tissue). °· Endometriosis (uterus lining tissue growing in the pelvis and on the pelvic organs). °· Pelvic congestion syndrome (female organs filling up with blood just before the menstrual period). °· Pain with the menstrual period. °· Pain with ovulation (producing an egg). °· Pain with an IUD (intrauterine device, birth control) in the uterus. °· Cancer of the female organs. °· Functional pain (pain not caused by a disease, may improve without treatment). °· Psychological pain. °· Depression. °DIAGNOSIS  °Your doctor will decide the seriousness of your pain by doing an examination. °· Blood tests. °· X-rays. °· Ultrasound. °· CT scan (computed tomography, special type of X-ray). °· MRI (magnetic resonance imaging). °· Cultures, for infection. °· Barium enema (dye inserted in the large intestine, to better view it with  X-rays). °· Colonoscopy (looking in intestine with a lighted tube). °· Laparoscopy (minor surgery, looking in abdomen with a lighted tube). °· Major abdominal exploratory surgery (looking in abdomen with a large incision). °TREATMENT  °The treatment will depend on the cause of the pain.  °· Many cases can be observed and treated at home. °· Over-the-counter medicines recommended by your caregiver. °· Prescription medicine. °· Antibiotics, for infection. °· Birth control pills, for painful periods or for ovulation pain. °· Hormone treatment, for endometriosis. °· Nerve blocking injections. °· Physical therapy. °· Antidepressants. °· Counseling with a psychologist or psychiatrist. °· Minor or major surgery. °HOME CARE INSTRUCTIONS  °· Do not take laxatives, unless directed by your caregiver. °· Take over-the-counter pain medicine only if ordered by your caregiver. Do not take aspirin because it can cause an upset stomach or bleeding. °· Try a clear liquid diet (broth or water) as ordered by your caregiver. Slowly move to a bland diet, as tolerated, if the pain is related to the stomach or intestine. °· Have a thermometer and take your temperature several times a day, and record it. °· Bed rest and sleep, if it helps the pain. °· Avoid sexual intercourse, if it causes pain. °· Avoid stressful situations. °· Keep your follow-up appointments and tests, as your caregiver orders. °· If the pain does not go away with medicine or surgery, you may try: °· Acupuncture. °· Relaxation exercises (yoga, meditation). °· Group therapy. °· Counseling. °SEEK MEDICAL CARE IF:  °· You notice certain foods cause stomach pain. °· Your home care treatment is not helping your pain. °· You need stronger pain medicine. °· You want your IUD removed. °· You feel faint or   lightheaded. °· You develop nausea and vomiting. °· You develop a rash. °· You are having side effects or an allergy to your medicine. °SEEK IMMEDIATE MEDICAL CARE IF:  °· Your  pain does not go away or gets worse. °· You have a fever. °· Your pain is felt only in portions of the abdomen. The right side could possibly be appendicitis. The left lower portion of the abdomen could be colitis or diverticulitis. °· You are passing blood in your stools (bright red or black tarry stools, with or without vomiting). °· You have blood in your urine. °· You develop chills, with or without a fever. °· You pass out. °MAKE SURE YOU:  °· Understand these instructions. °· Will watch your condition. °· Will get help right away if you are not doing well or get worse. °Document Released: 03/07/2007 Document Revised: 08/02/2011 Document Reviewed: 03/27/2009 °Exit[employer]® Patient Information ©2014 [employer]. ° °

## 2013-07-28 NOTE — MAU Provider Note (Signed)
History     CSN: [id number]  Arrival date and time: 07/28/13 1238   None     No chief complaint on file.  HPI  MS. Chelsea Branch is a 48 yo G2P2003 who presents for back pain and concerns about the mirena  - lost string off her mirena a couple months ago (for menorrhagia and dysmenorrhea) - has had several weeks of low back pain on and off - also having abdominal fullness on and off - currently feels fine - has been noticing she has more of a discharge in her underwear - discharge has a more fishy smell  - sexually active with a single partner. Has been together with the same partner for 30 years.   No fevers, chills, nausea, vomiting, diarrhea, constipation, no dysuria.     OB History   Grav Para Term Preterm Abortions TAB SAB Ect Mult Living   2 2 2      1 3       Past Medical History  Diagnosis Date  . Chronic systolic heart failure   . Hypertension   . MITRAL REGURGITATION 10/15/2008    Qualifier: Diagnosis of  By: Chelsea Branch, Chelsea Branch    . DYSLIPIDEMIA 06/08/2010    Qualifier: Diagnosis of  By: Chelsea Branch, Chelsea Branch    . 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    Qualifier: Diagnosis of  By: Chelsea Branch, Chelsea Branch    . Alopecia   . 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 Noncompliance   . Tobacco abuse   . CHF (congestive heart failure)   . Fibroids   . Abnormal uterine bleeding   . Stroke     Past Surgical History  Procedure Laterality Date  . Caesarian    . Heart loop recorder    . Cesarean section  1992  1994  . Tubal ligation  1994    Family History  Problem Relation Age of Onset  . Other Neg Hx   . Cancer Maternal Grandmother     uterine  . Hypertension Sister     History  Substance Use Topics  . Smoking status: Current Every Day Smoker -- 0.25 packs/day for 10 years    Types: Cigarettes  . Smokeless tobacco: Never Used  . Alcohol Use: No    Allergies:  Allergies  Allergen Reactions  .  Ace Inhibitors     REACTION: Cough    Prescriptions prior to admission  Medication Sig Dispense Refill  . carvedilol (COREG) 3.125 MG tablet Take 1 tablet (3.125 mg total) by mouth 2 (two) times daily with a meal.  180 tablet  3  . hydrALAZINE (APRESOLINE) 25 MG tablet Take 1 tablet (25 mg total) by mouth 2 (two) times daily.  180 tablet  3  . ibuprofen (ADVIL,MOTRIN) 800 MG tablet TAKE ONE TABLET BY MOUTH EVERY 8 HOURS AS NEEDED FOR PAIN  60 tablet  0  . potassium chloride SA (KLOR-CON M20) 20 MEQ tablet Take 1 tablet (20 mEq total) by mouth daily.  90 tablet  3  . pravastatin (PRAVACHOL) 20 MG tablet Take 1 tablet (20 mg total) by mouth daily.  90 tablet  4    ROS- see above.  Physical Exam   Blood pressure 148/94, pulse 98, temperature 98.3 F (36.8 C), temperature source Oral, resp. rate 18.  Physical Exam  Constitutional: She is oriented to person, place, and time. She appears well-developed and well-nourished.  HENT:  Head: Normocephalic and atraumatic.  Neck: Neck supple.  Cardiovascular: Normal rate and regular rhythm.   Murmur heard. Respiratory: Effort normal and breath sounds normal.  GI: Soft. Bowel sounds are normal. She exhibits no distension. There is no tenderness. There is no rebound and no guarding.  Genitourinary: Uterus is enlarged (approx 10 week size) and tender (slight tenderness). Cervix exhibits no discharge. Right adnexum displays no mass and no tenderness. Left adnexum displays no mass and no tenderness. No tenderness or bleeding around the vagina. No foreign body around the vagina. Vaginal discharge (watery) found.  Musculoskeletal:  No palpable areas of tenderness on the lower back. No paraspinal tenderness.   Neurological: She is alert and oriented to person, place, and time.  Skin: Skin is warm and dry.    MAU Course  Procedures  MDM Gc/chl Wet prep  Assessment and Plan   1) low back pain and cramping - unclear etiology. Benign exam. Pain  comes and goes and currently is gone. - pt more concerned about STD check  - discussed that she is due for a f/u in GYN clinic 3 months out from her mirena and that visit was never made   - would recommend that if her symptoms return, she f/u as scheduled in GYN clinic - no red flag symptoms today.   2) vaginal discharge - wet prep neg - gc/chl sent - will f/u results and let pt know - +leuks and bacteria on urine --> will send urine cx and treat if positive.   D/c to home with f/u as was to be scheduled with gyn clinic for chronic dysmenorrhea  Chelsea Branch L 07/28/2013, 1:52 PM

## 2013-07-29 LAB — URINE CULTURE: SPECIAL REQUESTS: NORMAL

## 2013-07-30 LAB — GC/CHLAMYDIA PROBE AMP
CT PROBE, AMP APTIMA: NEGATIVE
GC PROBE AMP APTIMA: NEGATIVE

## 2013-07-31 ENCOUNTER — Emergency Department (HOSPITAL_COMMUNITY): Payer: Medicaid Other

## 2013-07-31 ENCOUNTER — Observation Stay (HOSPITAL_COMMUNITY)
Admission: EM | Admit: 2013-07-31 | Discharge: 2013-08-01 | Disposition: A | Discharge status: Home / Self Care | Payer: Medicaid Other | Attending: Cardiology | Admitting: Cardiology

## 2013-07-31 ENCOUNTER — Encounter (HOSPITAL_COMMUNITY): Payer: Self-pay | Admitting: Emergency Medicine

## 2013-07-31 DIAGNOSIS — L658 Other specified nonscarring hair loss: Secondary | ICD-10-CM | POA: Insufficient documentation

## 2013-07-31 DIAGNOSIS — Z720 Tobacco use: Secondary | ICD-10-CM | POA: Diagnosis present

## 2013-07-31 DIAGNOSIS — I509 Heart failure, unspecified: Secondary | ICD-10-CM | POA: Diagnosis not present

## 2013-07-31 DIAGNOSIS — I5043 Acute on chronic combined systolic (congestive) and diastolic (congestive) heart failure: Secondary | ICD-10-CM

## 2013-07-31 DIAGNOSIS — Z8673 Personal history of transient ischemic attack (TIA), and cerebral infarction without residual deficits: Secondary | ICD-10-CM | POA: Diagnosis not present

## 2013-07-31 DIAGNOSIS — I5023 Acute on chronic systolic (congestive) heart failure: Principal | ICD-10-CM | POA: Insufficient documentation

## 2013-07-31 DIAGNOSIS — I10 Essential (primary) hypertension: Secondary | ICD-10-CM | POA: Diagnosis present

## 2013-07-31 DIAGNOSIS — D259 Leiomyoma of uterus, unspecified: Secondary | ICD-10-CM | POA: Diagnosis not present

## 2013-07-31 DIAGNOSIS — N920 Excessive and frequent menstruation with regular cycle: Secondary | ICD-10-CM | POA: Diagnosis not present

## 2013-07-31 DIAGNOSIS — E785 Hyperlipidemia, unspecified: Secondary | ICD-10-CM | POA: Diagnosis present

## 2013-07-31 DIAGNOSIS — I059 Rheumatic mitral valve disease, unspecified: Secondary | ICD-10-CM

## 2013-07-31 DIAGNOSIS — R0602 Shortness of breath: Secondary | ICD-10-CM | POA: Diagnosis present

## 2013-07-31 DIAGNOSIS — Z8679 Personal history of other diseases of the circulatory system: Secondary | ICD-10-CM | POA: Diagnosis present

## 2013-07-31 DIAGNOSIS — E876 Hypokalemia: Secondary | ICD-10-CM | POA: Diagnosis present

## 2013-07-31 DIAGNOSIS — F17200 Nicotine dependence, unspecified, uncomplicated: Secondary | ICD-10-CM | POA: Insufficient documentation

## 2013-07-31 DIAGNOSIS — D649 Anemia, unspecified: Secondary | ICD-10-CM | POA: Diagnosis not present

## 2013-07-31 DIAGNOSIS — I5030 Unspecified diastolic (congestive) heart failure: Secondary | ICD-10-CM | POA: Insufficient documentation

## 2013-07-31 DIAGNOSIS — R9431 Abnormal electrocardiogram [ECG] [EKG]: Secondary | ICD-10-CM

## 2013-07-31 DIAGNOSIS — I428 Other cardiomyopathies: Secondary | ICD-10-CM | POA: Insufficient documentation

## 2013-07-31 DIAGNOSIS — I5022 Chronic systolic (congestive) heart failure: Secondary | ICD-10-CM

## 2013-07-31 DIAGNOSIS — O903 Peripartum cardiomyopathy: Secondary | ICD-10-CM | POA: Diagnosis not present

## 2013-07-31 HISTORY — DX: Peripartum cardiomyopathy: O90.3

## 2013-07-31 HISTORY — DX: Excessive and frequent menstruation with regular cycle: N92.0

## 2013-07-31 LAB — COMPREHENSIVE METABOLIC PANEL
ALT: 13 U/L (ref 0–35)
AST: 19 U/L (ref 0–37)
Albumin: 3.3 g/dL — ABNORMAL LOW (ref 3.5–5.2)
Alkaline Phosphatase: 115 U/L (ref 39–117)
BILIRUBIN TOTAL: 0.7 mg/dL (ref 0.3–1.2)
BUN: 5 mg/dL — AB (ref 6–23)
CO2: 21 mEq/L (ref 19–32)
CREATININE: 0.62 mg/dL (ref 0.50–1.10)
Calcium: 8.9 mg/dL (ref 8.4–10.5)
Chloride: 102 mEq/L (ref 96–112)
GFR calc non Af Amer: >90 mL/min (ref >90)
Glucose, Bld: 84 mg/dL (ref 70–99)
POTASSIUM: 3.7 meq/L (ref 3.7–5.3)
Sodium: 139 mEq/L (ref 137–147)
Total Protein: 7.2 g/dL (ref 6.0–8.3)

## 2013-07-31 LAB — CBC
HEMATOCRIT: 32.8 % — AB (ref 36.0–46.0)
HEMATOCRIT: 34.3 % — AB (ref 36.0–46.0)
HEMOGLOBIN: 10.8 g/dL — AB (ref 12.0–15.0)
Hemoglobin: 10.2 g/dL — ABNORMAL LOW (ref 12.0–15.0)
MCH: 21.6 pg — AB (ref 26.0–34.0)
MCH: 21.8 pg — ABNORMAL LOW (ref 26.0–34.0)
MCHC: 31.1 g/dL (ref 30.0–36.0)
MCHC: 31.5 g/dL (ref 30.0–36.0)
MCV: 69.5 fL — AB (ref 78.0–100.0)
MCV: 69.3 fL — ABNORMAL LOW (ref 78.0–100.0)
PLATELETS: 232 10*3/uL (ref 150–400)
Platelets: 231 10*3/uL (ref 150–400)
RBC: 4.72 MIL/uL (ref 3.87–5.11)
RBC: 4.95 MIL/uL (ref 3.87–5.11)
RDW: 21.9 % — ABNORMAL HIGH (ref 11.5–15.5)
RDW: 22 % — AB (ref 11.5–15.5)
WBC: 6.8 10*3/uL (ref 4.0–10.5)
WBC: 7.3 10*3/uL (ref 4.0–10.5)

## 2013-07-31 LAB — MAGNESIUM: Magnesium: 1.7 mg/dL (ref 1.5–2.5)

## 2013-07-31 LAB — BASIC METABOLIC PANEL
BUN: 6 mg/dL (ref 6–23)
CO2: 24 meq/L (ref 19–32)
CREATININE: 0.6 mg/dL (ref 0.50–1.10)
Calcium: 8.7 mg/dL (ref 8.4–10.5)
Chloride: 104 mEq/L (ref 96–112)
GFR calc Af Amer: >90 mL/min (ref >90)
GFR calc non Af Amer: >90 mL/min (ref >90)
Glucose, Bld: 87 mg/dL (ref 70–99)
Potassium: 3.3 mEq/L — ABNORMAL LOW (ref 3.7–5.3)
Sodium: 142 mEq/L (ref 137–147)

## 2013-07-31 LAB — TROPONIN I
Troponin I: <0.3 ng/mL (ref <0.30)
Troponin I: <0.3 ng/mL (ref <0.30)

## 2013-07-31 LAB — TSH: TSH: 0.155 u[IU]/mL — AB (ref 0.350–4.500)

## 2013-07-31 LAB — APTT: APTT: 32 s (ref 24–37)

## 2013-07-31 LAB — PROTIME-INR
INR: 1.1 (ref 0.00–1.49)
PROTHROMBIN TIME: 14 s (ref 11.6–15.2)

## 2013-07-31 LAB — I-STAT TROPONIN, ED: Troponin i, poc: 0.04 ng/mL (ref 0.00–0.08)

## 2013-07-31 LAB — PRO B NATRIURETIC PEPTIDE: Pro B Natriuretic peptide (BNP): 568.4 pg/mL — ABNORMAL HIGH (ref 0–125)

## 2013-07-31 MED ORDER — SIMVASTATIN 10 MG PO TABS
10.0000 mg | ORAL_TABLET | Freq: Every day | ORAL | Status: DC
  Administered 2013-07-31: 10 mg via ORAL
  Filled 2013-07-31 (×2): qty 1

## 2013-07-31 MED ORDER — FUROSEMIDE 10 MG/ML IJ SOLN
40.0000 mg | Freq: Two times a day (BID) | INTRAMUSCULAR | Status: DC
  Administered 2013-07-31 – 2013-08-01 (×2): 40 mg via INTRAVENOUS
  Filled 2013-07-31 (×4): qty 4

## 2013-07-31 MED ORDER — POTASSIUM CHLORIDE CRYS ER 20 MEQ PO TBCR
40.0000 meq | EXTENDED_RELEASE_TABLET | Freq: Two times a day (BID) | ORAL | Status: DC
  Administered 2013-07-31 – 2013-08-01 (×2): 40 meq via ORAL
  Filled 2013-07-31 (×3): qty 2

## 2013-07-31 MED ORDER — POTASSIUM CHLORIDE CRYS ER 20 MEQ PO TBCR
40.0000 meq | EXTENDED_RELEASE_TABLET | Freq: Once | ORAL | Status: AC
  Administered 2013-07-31: 40 meq via ORAL
  Filled 2013-07-31: qty 2

## 2013-07-31 MED ORDER — HEPARIN SODIUM (PORCINE) 5000 UNIT/ML IJ SOLN
5000.0000 [IU] | Freq: Three times a day (TID) | INTRAMUSCULAR | Status: DC
  Administered 2013-07-31 – 2013-08-01 (×3): 5000 [IU] via SUBCUTANEOUS
  Filled 2013-07-31 (×6): qty 1

## 2013-07-31 MED ORDER — ACETAMINOPHEN 500 MG PO TABS: 1000.0000 mg | ORAL_TABLET | Freq: Four times a day (QID) | ORAL | Status: DC | PRN

## 2013-07-31 MED ORDER — PNEUMOCOCCAL VAC POLYVALENT 25 MCG/0.5ML IJ INJ
0.5000 mL | INJECTION | INTRAMUSCULAR | Status: DC
  Filled 2013-07-31: qty 0.5

## 2013-07-31 MED ORDER — IBUPROFEN 400 MG PO TABS
400.0000 mg | ORAL_TABLET | Freq: Four times a day (QID) | ORAL | Status: DC | PRN
  Administered 2013-07-31: 400 mg via ORAL
  Filled 2013-07-31 (×2): qty 1

## 2013-07-31 MED ORDER — CARVEDILOL 3.125 MG PO TABS
3.1250 mg | ORAL_TABLET | Freq: Two times a day (BID) | ORAL | Status: DC
  Administered 2013-07-31 – 2013-08-01 (×3): 3.125 mg via ORAL
  Filled 2013-07-31 (×4): qty 1

## 2013-07-31 MED ORDER — ASPIRIN EC 81 MG PO TBEC
81.0000 mg | DELAYED_RELEASE_TABLET | Freq: Every day | ORAL | Status: DC
  Administered 2013-07-31 – 2013-08-01 (×2): 81 mg via ORAL
  Filled 2013-07-31 (×2): qty 1

## 2013-07-31 MED ORDER — SODIUM CHLORIDE 0.9 % IJ SOLN: 3.0000 mL | INTRAMUSCULAR | Status: DC | PRN

## 2013-07-31 MED ORDER — SODIUM CHLORIDE 0.9 % IJ SOLN
3.0000 mL | Freq: Two times a day (BID) | INTRAMUSCULAR | Status: DC
  Administered 2013-07-31 – 2013-08-01 (×3): 3 mL via INTRAVENOUS

## 2013-07-31 MED ORDER — FUROSEMIDE 10 MG/ML IJ SOLN
20.0000 mg | Freq: Once | INTRAMUSCULAR | Status: AC
  Administered 2013-07-31: 20 mg via INTRAVENOUS
  Filled 2013-07-31: qty 2

## 2013-07-31 MED ORDER — HYDRALAZINE HCL 25 MG PO TABS
25.0000 mg | ORAL_TABLET | Freq: Two times a day (BID) | ORAL | Status: DC
  Administered 2013-07-31 – 2013-08-01 (×3): 25 mg via ORAL
  Filled 2013-07-31 (×4): qty 1

## 2013-07-31 MED ORDER — SODIUM CHLORIDE 0.9 % IV SOLN: 250.0000 mL | INTRAVENOUS | Status: DC | PRN

## 2013-07-31 NOTE — Progress Notes (Signed)
Echocardiogram 2D Echocardiogram has been performed.  Chelsea Branch 07/31/2013, 3:09 PM

## 2013-07-31 NOTE — Progress Notes (Signed)
Report given to receiving night shift RN.  RN denies any questions or concerns at this time.  Pt resting comfortably in bed and appears in no acute distress. Gae Gallop RN

## 2013-07-31 NOTE — ED Notes (Signed)
Gave report to [facility] on [facility]. Dx CHF w/LV diastolic dysfn.

## 2013-07-31 NOTE — Care Management Note (Addendum)
  Page 2 of 2   08/01/2013     12:29:04 PM   CARE MANAGEMENT NOTE 08/01/2013  Patient:  Chelsea Branch,Chelsea Branch   Account Number:  [id number]  Date Initiated:  07/31/2013  Documentation initiated by:  Florrie Ramires  Subjective/Objective Assessment:   admitted with history of postpartum cardiomyopathy admitted with increasing orthopnea dyspnea and peripheral edema.     Action/Plan:   CM to follow for disposition needs   Anticipated DC Date:  08/01/2013   Anticipated DC Plan:  [place]  CM consult  [facility]  Medication Assistance      Choice offered to / List presented to:  NA           Status of service:  Completed, signed off Medicare Important Message given?   (If response is "NO", the following Medicare IM given date fields will be blank) Date Medicare IM given:   Date Additional Medicare IM given:    Discharge Disposition:  HOME/SELF CARE  Per UR Regulation:  Reviewed for med. necessity/level of care/duration of stay  If discussed at [place] of Stay Meetings, dates discussed:    Comments:  08/01/2013 Socail:  from home with sons MCD Potential / Self pay Patient states reason for admission:  "I stopped taking my meds because I was unable to afford to buy." [place] provided to patient and instructions provided with [facility] letter review CM instructed to fill Rx today post d/c. Patient has been provided [employer] and has pending appt 08/10/13 CM provided printed copy of $4.00 med list as resource for future meds. MD has cleared to return back to work on Monday. ADD:  today Elisandro Jarrett RN, BSN, MSHL, CCM 08/01/2013  07/31/2013 Admitted with history of postpartum cardiomyopathy admitted with increasing orthopnea dyspnea and peripheral edema. 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 CXR 3/10 no edema or consolidation Abnl ECG ST & T Wave abnormality IV Lasix 40 q 12  hours Wt 199 Observation Status Mary Hockey RN, BSN, MSHL, CCm 07/31/2013

## 2013-07-31 NOTE — Discharge Planning (Signed)
E7NT Felicia E, Community Liaison  Follow up appointment made with the [facility] for  Friday March 20,2015 at 4:00pm to establish primary care. Patient will also be obtaining the orange card during this visit, application was given. Patient is aware of this appointment and has my contact information for any future questions or concerns.

## 2013-07-31 NOTE — ED Provider Notes (Signed)
CSN: [id number]     Arrival date & time 07/31/13  [id number] History   First MD Initiated Contact with Patient 07/31/13 [phone]     Chief Complaint  Patient presents with  . Shortness of Breath     (Consider location/radiation/quality/duration/timing/severity/associated sxs/prior Treatment) HPI Comments: Patient is a 48 year old female with a past medical history of chronic systolic heart failure, hypertension, mitral regurgitation, dyslipidemia, Arnold-Chiari malformation, nonischemic cardiomyopathy, stroke and medication noncompliance who presents to the emergency department complaining of shortness of breath x2 days. Patient states she's had increased shortness of breath on exertion, orthopnea and PND over the past 2 days, has a sensation of chest tightness and a feeling of fluid around her lungs. Admits to 10 pound weight gain over the past month, slight swelling around her ankles. She has been off her medications for the past month because she has been unable to afford them. She has not seen her cardiologist Dr. Caryl Comes since July 2014. At that time he took her off of furosemide. Denies fever, chills, cough, wheezing or abdominal pain. She does state her abdomen feels bloated. She has not needed to be admitted for a CHF exacerbation in over 9 years.  Patient is a 48 y.o. female presenting with shortness of breath. The history is provided by the patient and the spouse.  Shortness of Breath   Past Medical History  Diagnosis Date  . Chronic systolic heart failure   . Hypertension   . MITRAL REGURGITATION 10/15/2008    Qualifier: Diagnosis of  By: Orville Govern CMA, Arbie Cookey    . DYSLIPIDEMIA 06/08/2010    Qualifier: Diagnosis of  By: Jorene Minors, Scott    . 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 Alopecia   . 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 Noncompliance   . Tobacco abuse   . CHF  (congestive heart failure)   . Fibroids   . Abnormal uterine bleeding   . Stroke    Past Surgical History  Procedure Laterality Date  . Caesarian    . Heart loop recorder    . Cesarean section  1992  1994  . Tubal ligation  1994   Family History  Problem Relation Age of Onset  . Other Neg Hx   . Cancer Maternal Grandmother     uterine  . Hypertension Sister    History  Substance Use Topics  . Smoking status: Current Every Day Smoker -- 0.25 packs/day for 10 years    Types: Cigarettes  . Smokeless tobacco: Never Used  . Alcohol Use: No   OB History   Grav Para Term Preterm Abortions TAB SAB Ect Mult Living   2 2 2      1 3      Review of Systems  Respiratory: Positive for chest tightness and shortness of breath.   Cardiovascular: Positive for leg swelling.       Positive for orthopnea and PND.  Gastrointestinal: Positive for abdominal distention.  All other systems reviewed and are negative.      Allergies  Ace inhibitors  Home Medications   Current Outpatient Rx  Name  Route  Sig  Dispense  Refill  . acetaminophen (TYLENOL) 500 MG tablet   Oral   Take 1,000 mg by mouth every 6 (six) hours as needed for mild pain or moderate pain.         . carvedilol (  COREG) 3.125 MG tablet   Oral   Take 1 tablet (3.125 mg total) by mouth 2 (two) times daily with a meal.   180 tablet   3   . hydrALAZINE (APRESOLINE) 25 MG tablet   Oral   Take 1 tablet (25 mg total) by mouth 2 (two) times daily.   180 tablet   3   . ibuprofen (ADVIL,MOTRIN) 200 MG tablet   Oral   Take 400 mg by mouth every 6 (six) hours as needed for mild pain or moderate pain.         . potassium chloride SA (KLOR-CON M20) 20 MEQ tablet   Oral   Take 1 tablet (20 mEq total) by mouth daily.   90 tablet   3   . pravastatin (PRAVACHOL) 20 MG tablet   Oral   Take 1 tablet (20 mg total) by mouth daily.   90 tablet   4    BP 174/105  Pulse 108  Temp(Src) 98.1 F (36.7 C) (Oral)  Resp  26  Ht 5\' 8"  (1.727 m)  Wt 199 lb (90.266 kg)  BMI 30.26 kg/m2  SpO2 96% Physical Exam  Nursing note and vitals reviewed. Constitutional: She is oriented to person, place, and time. She appears well-developed and well-nourished. No distress.  Overweight.  HENT:  Head: Normocephalic and atraumatic.  Mouth/Throat: Oropharynx is clear and moist.  Eyes: Conjunctivae and EOM are normal. Pupils are equal, round, and reactive to light.  Neck: Normal range of motion. Neck supple. JVD present.  Cardiovascular: Regular rhythm, normal heart sounds and intact distal pulses.  Tachycardia present.   Trace pitting edema around ankles bilateral.  Pulmonary/Chest: Effort normal. No respiratory distress. She has rales (bilateral lung bases).  Abdominal: Soft. Normal appearance and bowel sounds are normal. She exhibits no distension. There is tenderness. There is no rigidity, no rebound and no guarding.  Very mild upper abdominal tenderness. No peritoneal signs.  Musculoskeletal: Normal range of motion. She exhibits no edema.  Neurological: She is alert and oriented to person, place, and time. She has normal strength. No sensory deficit.  Speech fluent, goal oriented. Moves limbs without ataxia. Equal grip strength bilateral.  Skin: Skin is warm and dry. She is not diaphoretic.  Psychiatric: She has a normal mood and affect. Her behavior is normal.    ED Course  Procedures (including critical care time) Labs Review Labs Reviewed  CBC - Abnormal; Notable for the following:    Hemoglobin 10.2 (*)    HCT 32.8 (*)    MCV 69.5 (*)    MCH 21.6 (*)    RDW 21.9 (*)    All other components within normal limits  BASIC METABOLIC PANEL - Abnormal; Notable for the following:    Potassium 3.3 (*)    All other components within normal limits  PRO B NATRIURETIC PEPTIDE - Abnormal; Notable for the following:    Pro B Natriuretic peptide (BNP) 568.4 (*)    All other components within normal limits  I-STAT  TROPOININ, ED   Imaging Review Dg Chest 2 View  07/31/2013   CLINICAL DATA:  Shortness of Breath  EXAM: CHEST  2 VIEW  COMPARISON:  July 03, 2011  FINDINGS: There is slight scarring in the left base. Lungs are otherwise clear. Heart is upper normal in size with normal pulmonary vascularity. No adenopathy. No bone lesions.  IMPRESSION: No edema or consolidation.   Electronically Signed   By: Lowella Grip M.D.   On:  07/31/2013 07:29     EKG Interpretation   Date/Time:  Tuesday July 31 2013 06:34:10 EDT Ventricular Rate:  95 PR Interval:  150 QRS Duration: 90 QT Interval:  362 QTC Calculation: 454 R Axis:   49 Text Interpretation:  Normal sinus rhythm ST \\T \ T wave abnormality,  consider lateral ischemia Abnormal ECG Confirmed by Jeneen Rinks  MD, [place]  [phone]) on 07/31/2013 7:19:49 AM      MDM   Final diagnoses:  CHF exacerbation  Shortness of breath  Abnormal EKG   Pt presenting with increased shortness of breath, PND and orthopnea, history of heart failure. She appears in no apparent distress, afebrile. Hypertensive initially at 174/105, decreasing to 159/99 just after sitting on exam bed. O2 sat 100% on room air. Slight lateral T wave changes on EKG compared to prior. Rales heard at lung bases bilateral. Probable CHF exacerbation. Labs pending. 8:47 AM Chest x-ray clear despite rales heard on exam. BNP 568.4. Troponin negative. Patient clinically is in heart failure, will give 20 mg IV Lasix. Will consult cardiology given increased SOB, EKG changes and clinically presenting as CHF. Case discussed with attending Dr. Jeneen Rinks who agrees with plan of care. 9:40 AM Cardiology Dr. Mare Ferrari admitting patient.   Illene Labrador, PA-C 07/31/13 [phone]

## 2013-07-31 NOTE — ED Notes (Signed)
Patient presents stating she feels like there is fluid around her heart.  States she gets SOB at times and is "burning up"

## 2013-07-31 NOTE — ED Notes (Signed)
Pt up to bathroom without assistance. Family at bedside.

## 2013-07-31 NOTE — ED Notes (Signed)
PT even unlabored respirations, calm, NAD

## 2013-07-31 NOTE — H&P (Signed)
Patient ID: Shakyla Nolley MRN: [id number], DOB/AGE: 07/26/1965   Admit date: 07/31/2013   Primary Physician: No PCP Per Patient Primary Cardiologist: Dr. Jolyn Nap  Pt. Profile:  48 year old woman with past history of postpartum cardiomyopathy admitted with increasing orthopnea dyspnea and peripheral edema.  Problem List  Past Medical History  Diagnosis Date  . Chronic systolic heart failure   . Hypertension   . MITRAL REGURGITATION 10/15/2008    Qualifier: Diagnosis of  By: Orville Govern CMA, Arbie Cookey    . DYSLIPIDEMIA 06/08/2010    Qualifier: Diagnosis of  By: Jorene Minors, Scott    . 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 Alopecia   . 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 Noncompliance   . Tobacco abuse   . CHF (congestive heart failure)   . Fibroids   . Abnormal uterine bleeding   . Stroke     Past Surgical History  Procedure Laterality Date  . Caesarian    . Heart loop recorder    . Cesarean section  1992  1994  . Tubal ligation  1994     Allergies  Allergies  Allergen Reactions  . Ace Inhibitors     REACTION: Cough    HPI This 48 year old African American woman is followed by Dr. 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  She initially had recovery of LV function most recently in 2008.  However in 2011 her left pillar function had decreased to 25%.  Her most recent echocardiogram on 11/22/12 showed a left ventricular ejection fraction of 30-35% with diffuse hypokinesis and there was grade 1 diastolic dysfunction.  There was mild mitral regurgitation. The patient has done well until the past week or so when she has had increasing difficulty breathing at night and has had orthopnea and paroxysmal nocturnal dyspnea.  She has had mild chest tightness.  She has noted mild peripheral edema.  Her home medicines do not include a diuretic  at the present time.  Home Medications  Prior to Admission medications   Medication Sig Start Date End Date Taking? Authorizing Provider  acetaminophen (TYLENOL) 500 MG tablet Take 1,000 mg by mouth every 6 (six) hours as needed for mild pain or moderate pain.   Yes Historical Provider, MD  carvedilol (COREG) 3.125 MG tablet Take 1 tablet (3.125 mg total) by mouth 2 (two) times daily with a meal. 04/13/13  Yes Deboraha Sprang, MD  hydrALAZINE (APRESOLINE) 25 MG tablet Take 1 tablet (25 mg total) by mouth 2 (two) times daily. 04/13/13 04/13/14 Yes Deboraha Sprang, MD  ibuprofen (ADVIL,MOTRIN) 200 MG tablet Take 400 mg by mouth every 6 (six) hours as needed for mild pain or moderate pain.   Yes Historical Provider, MD  potassium chloride SA (KLOR-CON M20) 20 MEQ tablet Take 1 tablet (20 mEq total) by mouth daily. 04/13/13  Yes Deboraha Sprang, MD  pravastatin (PRAVACHOL) 20 MG tablet Take 1 tablet (20 mg total) by mouth daily. 04/13/13  Yes Deboraha Sprang, MD    Family History  Family History  Problem Relation Age of Onset  . Other Neg Hx   . Cancer Maternal Grandmother     uterine  . Hypertension Sister     Social History  History   Social History  . Marital Status: Single    Spouse Name: N/A    Number of Children:  N/A  . Years of Education: N/A   Occupational History  . Not on file.   Social History Main Topics  . Smoking status: Current Every Day Smoker -- 0.25 packs/day for 10 years    Types: Cigarettes  . Smokeless tobacco: Never Used  . Alcohol Use: No  . Drug Use: No  . Sexual Activity: Yes    Birth Control/ Protection: Surgical   Other Topics Concern  . Not on file   Social History Narrative  . No narrative on file     Review of Systems General:  No chills, fever, night sweats or weight changes.  Cardiovascular:  Positive for orthopnea and paroxysmal nocturnal dyspnea Dermatological: No rash, lesions/masses Respiratory: No cough, dyspnea.  The patient has  noted mild wheezing. Urologic: No hematuria, dysuria Abdominal:   No nausea, vomiting, diarrhea, bright red blood per rectum, melena, or hematemesis Neurologic:  No visual changes, wkns, changes in mental status. All other systems reviewed and are otherwise negative except as noted above.  Physical Exam  Blood pressure 148/90, pulse 100, temperature 98.1 F (36.7 C), temperature source Oral, resp. rate 18, height 5\' 8"  (1.727 m), weight 199 lb (90.266 kg), SpO2 96.00%.  General: Pleasant, NAD Psych: Normal affect. Neuro: Alert and oriented X 3. Moves all extremities spontaneously. HEENT: Normal  Neck: Supple without bruits or JVD. Lungs:  Mild bibasilar inspiratory rales.  No wheezing Heart: RRR no s3, s4, or murmurs. Abdomen: Soft, non-tender, non-distended, BS + x 4.  Extremities: No clubbing, cyanosis.  Mild peripheral edema.  Labs  Troponin [facility] of Care Test)  Recent Labs  07/31/13 0740  TROPIPOC 0.04   No results found for this basename: CKTOTAL, CKMB, TROPONINI,  in the last 72 hours Lab Results  Component Value Date   WBC 6.8 07/31/2013   HGB 10.2* 07/31/2013   HCT 32.8* 07/31/2013   MCV 69.5* 07/31/2013   PLT 232 07/31/2013     Recent Labs Lab 07/31/13 0735  NA 142  K 3.3*  CL 104  CO2 24  BUN 6  CREATININE 0.60  CALCIUM 8.7  GLUCOSE 87   Lab Results  Component Value Date   CHOL  Value: 143        ATP III CLASSIFICATION:  <200     mg/dL   Desirable  200-239  mg/dL   Borderline High  >=240    mg/dL   High        05/18/2010   HDL 38* 05/18/2010   LDLCALC  Value: 91        Total Cholesterol/HDL:CHD Risk Coronary Heart Disease Risk Table                     Men   Women  1/2 Average Risk   3.4   3.3  Average Risk       5.0   4.4  2 X Average Risk   9.6   7.1  3 X Average Risk  23.4   11.0        Use the calculated Patient Ratio above and the CHD Risk Table to determine the patient's CHD Risk.        ATP III CLASSIFICATION (LDL):  <100     mg/dL   Optimal   100-129  mg/dL   Near or Above                    Optimal  130-159  mg/dL   Borderline  160-189  mg/dL   High  >190     mg/dL   Very High 05/18/2010   TRIG 71 05/18/2010   No results found for this basename: DDIMER     Radiology/Studies  Dg Chest 2 View  07/31/2013   CLINICAL DATA:  Shortness of Breath  EXAM: CHEST  2 VIEW  COMPARISON:  July 03, 2011  FINDINGS: There is slight scarring in the left base. Lungs are otherwise clear. Heart is upper normal in size with normal pulmonary vascularity. No adenopathy. No bone lesions.  IMPRESSION: No edema or consolidation.   Electronically Signed   By: Lowella Grip M.D.   On: 07/31/2013 07:29    ECG  Normal sinus rhythm ST & T wave abnormality, consider lateral ischemia, new since prior tracing. Abnormal ECG  ASSESSMENT AND PLAN 1. nonischemic cardiomyopathy 2. 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 3. Anemia 4. history of fibroid tumors followed by GYN 5. Hypokalemia 6. hypertension  Plan: Admit for observation.  IV Lasix.  Update 2-D echo.  Serial cardiac enzymes.  Continue beta blocker, hydralazine, statin therapy.  Replete potassium.  Signed, Darlin Coco, MD  07/31/2013, 9:27 AM

## 2013-07-31 NOTE — ED Notes (Signed)
MD at bedside.cardiology  

## 2013-08-01 ENCOUNTER — Other Ambulatory Visit: Payer: Self-pay | Admitting: Physician Assistant

## 2013-08-01 ENCOUNTER — Encounter (HOSPITAL_COMMUNITY): Payer: Self-pay | Admitting: Physician Assistant

## 2013-08-01 DIAGNOSIS — I5022 Chronic systolic (congestive) heart failure: Secondary | ICD-10-CM

## 2013-08-01 DIAGNOSIS — Z720 Tobacco use: Secondary | ICD-10-CM | POA: Diagnosis present

## 2013-08-01 DIAGNOSIS — Z8679 Personal history of other diseases of the circulatory system: Secondary | ICD-10-CM | POA: Diagnosis present

## 2013-08-01 DIAGNOSIS — I639 Cerebral infarction, unspecified: Secondary | ICD-10-CM | POA: Insufficient documentation

## 2013-08-01 DIAGNOSIS — E0590 Thyrotoxicosis, unspecified without thyrotoxic crisis or storm: Secondary | ICD-10-CM

## 2013-08-01 DIAGNOSIS — I10 Essential (primary) hypertension: Secondary | ICD-10-CM | POA: Diagnosis present

## 2013-08-01 DIAGNOSIS — I5021 Acute systolic (congestive) heart failure: Secondary | ICD-10-CM

## 2013-08-01 LAB — CBC
HEMATOCRIT: 35 % — AB (ref 36.0–46.0)
HEMOGLOBIN: 10.9 g/dL — AB (ref 12.0–15.0)
MCH: 21.5 pg — ABNORMAL LOW (ref 26.0–34.0)
MCHC: 31.1 g/dL (ref 30.0–36.0)
MCV: 69.2 fL — AB (ref 78.0–100.0)
Platelets: 227 10*3/uL (ref 150–400)
RBC: 5.06 MIL/uL (ref 3.87–5.11)
RDW: 22.2 % — ABNORMAL HIGH (ref 11.5–15.5)
WBC: 4.1 10*3/uL (ref 4.0–10.5)

## 2013-08-01 LAB — BASIC METABOLIC PANEL
BUN: 8 mg/dL (ref 6–23)
CHLORIDE: 102 meq/L (ref 96–112)
CO2: 24 meq/L (ref 19–32)
Calcium: 8.6 mg/dL (ref 8.4–10.5)
Creatinine, Ser: 0.69 mg/dL (ref 0.50–1.10)
GFR calc non Af Amer: >90 mL/min (ref >90)
Glucose, Bld: 118 mg/dL — ABNORMAL HIGH (ref 70–99)
POTASSIUM: 2.9 meq/L — AB (ref 3.7–5.3)
Sodium: 139 mEq/L (ref 137–147)

## 2013-08-01 LAB — TROPONIN I: Troponin I: <0.3 ng/mL (ref <0.30)

## 2013-08-01 MED ORDER — POTASSIUM CHLORIDE CRYS ER 20 MEQ PO TBCR: 20.0000 meq | EXTENDED_RELEASE_TABLET | Freq: Two times a day (BID) | ORAL | Status: DC

## 2013-08-01 MED ORDER — POTASSIUM CHLORIDE CRYS ER 20 MEQ PO TBCR: 40.0000 meq | EXTENDED_RELEASE_TABLET | Freq: Once | ORAL | Status: DC

## 2013-08-01 MED ORDER — SPIRONOLACTONE 25 MG PO TABS: 25.0000 mg | ORAL_TABLET | Freq: Every day | ORAL | Status: DC

## 2013-08-01 MED ORDER — FUROSEMIDE 40 MG PO TABS: 40.0000 mg | ORAL_TABLET | Freq: Every day | ORAL | Status: DC

## 2013-08-01 NOTE — Progress Notes (Signed)
Patient with 9 second rhythm change to second degree type 2 with a brady to 26.  Asymptomatic.  MD on call notified. No new orders placed at this time. RN will continue to monitor. Shellee Milo, RN

## 2013-08-01 NOTE — Progress Notes (Signed)
PROGRESS NOTE  Subjective:   The patient is a 48 year old female with a past history of postpartum cardiopathy. She was admitted to the hospital on March 10 with increasing orthopnea, PND, and leg edema.  She has a long history of hypertension and chronic systolic congestive heart failure.  Her left ventricular EF was 25% by echo in 2011 and improved to 35% on an echo from July, 2014. She is mild mitral regurgitation.  She's feeling better. She ruled out for myocardial infarction overnight. She has diuresed 1.6 liters  and she is now hypokalemic.   Objective:    Vital Signs:   Temp:  [97.8 F (36.6 C)-99.1 F (37.3 C)] 97.8 F (36.6 C) (03/11 0526) Pulse Rate:  [65-109] 82 (03/11 0526) Resp:  [18-20] 18 (03/11 0526) BP: (125-149)/(56-93) 128/77 mmHg (03/11 0526) SpO2:  [92 %-99 %] 98 % (03/11 0526) Weight:  [193 lb 12.8 oz (87.907 kg)-197 lb 9.6 oz (89.631 kg)] 193 lb 12.8 oz (87.907 kg) (03/11 0625)  Last BM Date: 07/30/13   24-hour weight change: Weight change: -1 lb 6.4 oz (-0.635 kg)  Weight trends: Filed Weights   07/31/13 0637 07/31/13 1108 08/01/13 0625  Weight: 199 lb (90.266 kg) 197 lb 9.6 oz (89.631 kg) 193 lb 12.8 oz (87.907 kg)    Intake/Output:  03/10 0701 - 03/11 0700 In: 1040 [P.O.:1040] Out: 1600 [Urine:1600] Total I/O In: 240 [P.O.:240] Out: 1200 [Urine:1200]   Physical Exam: BP 128/77  Pulse 82  Temp(Src) 97.8 F (36.6 C) (Oral)  Resp 18  Ht 5\' 8"  (1.727 m)  Wt 193 lb 12.8 oz (87.907 kg)  BMI 29.47 kg/m2  SpO2 98%  Wt Readings from Last 3 Encounters:  08/01/13 193 lb 12.8 oz (87.907 kg)  06/15/13 198 lb (89.812 kg)  03/21/13 195 lb (88.451 kg)    General: Vital signs reviewed and noted.   Head: Normocephalic, atraumatic.  Eyes: conjunctivae/corneas clear.  EOM's intact.   Throat: normal  Neck:  normal   Lungs:    clear  Heart:  RR  Abdomen:  Soft, non-tender, non-distended    Extremities: No edema   Neurologic: A&O X3,  CN II - XII are grossly intact.   Psych: Normal     Labs: BMET:  Recent Labs  07/31/13 1155 08/01/13 0538  NA 139 139  K 3.7 2.9*  CL 102 102  CO2 21 24  GLUCOSE 84 118*  BUN 5* 8  CREATININE 0.62 0.69  CALCIUM 8.9 8.6  MG 1.7  --     Liver function tests:  Recent Labs  07/31/13 1155  AST 19  ALT 13  ALKPHOS 115  BILITOT 0.7  PROT 7.2  ALBUMIN 3.3*   No results found for this basename: LIPASE, AMYLASE,  in the last 72 hours  CBC:  Recent Labs  07/31/13 1155 08/01/13 0538  WBC 7.3 4.1  HGB 10.8* 10.9*  HCT 34.3* 35.0*  MCV 69.3* 69.2*  PLT 231 227    Cardiac Enzymes:  Recent Labs  07/31/13 1155 07/31/13 1828 07/31/13 2345  TROPONINI <0.30 <0.30 <0.30    Coagulation Studies:  Recent Labs  07/31/13 1155  LABPROT 14.0  INR 1.10    Other: No components found with this basename: POCBNP,  No results found for this basename: DDIMER,  in the last 72 hours No results found for this basename: HGBA1C,  in the last 72 hours No results found for this basename: CHOL, HDL, LDLCALC, TRIG, CHOLHDL,  in  the last 72 hours  Recent Labs  07/31/13 1155  TSH 0.155*   No results found for this basename: VITAMINB12, FOLATE, FERRITIN, TIBC, IRON, RETICCTPCT,  in the last 72 hours   Other results:  EKG :  Tele:  NSR  Medications:    Infusions:    Scheduled Medications: . aspirin EC  81 mg Oral Daily  . carvedilol  3.125 mg Oral BID WC  . furosemide  40 mg Intravenous Q12H  . heparin  5,000 Units Subcutaneous 3 times per day  . hydrALAZINE  25 mg Oral BID  . pneumococcal 23 valent vaccine  0.5 mL Intramuscular Tomorrow-1000  . potassium chloride SA  40 mEq Oral BID  . simvastatin  10 mg Oral q1800  . sodium chloride  3 mL Intravenous Q12H    Assessment/ Plan:    1. Acute on chronicsystolic congestive heart failure:  The patient is a long history of mild systolic congestive heart failure.   She's been eating a little bit more fast food  recently. She has not been taking any Lasix because it was difficult to maintain her potassium.  Echo yesterday showed an EF of 25-30%.  Mild MR  She's been diuresed this morning. Her potassium is low. We ambulated the halls approximately 200 feet and she did not have any episodes of shortness of breath or syncope.  We will continue with Coreg 3.125 mg twice a day. Continue hydralazine 25 mg twice a day Add Lasix 40 mg a day Restart spironolactone 25 mg a day Continue potassium chloride 20 mEq twice a day The case manager is working with her on paying for meds.  We had a long talk about avoiding salty food..  She knows better but ate salty food / fast food anyway.    She needs to quit smoking.  We will have her come to the office on Monday for lab work. ( BMP)   She'll need a note excusing her from work until Monday afternoon.   Disposition: DC to home  Length of Stay: 1  Thayer Headings, Brooke Bonito., MD, [facility] 08/01/2013, 10:38 AM Office [phone] Pager [phone]

## 2013-08-01 NOTE — Discharge Summary (Signed)
Discharge Summary   [name] MRN: [id number], DOB/AGE: [birth year] 48 y.o. Admit date: 07/31/2013 D/C date:     08/01/2013  Primary Cardiologist: Dr. Caryl Comes  Active Problems:   Acute on chronic systolic CHF (congestive heart failure)   DYSLIPIDEMIA   Hypertension   Tobacco abuse   HYPOKALEMIA   Menorrhagia    Admission Dates: 07/31/13 - 08/01/13 Discharge Diagnosis: Acute on chronic systolic congestive heart failure  HPI:  Chelsea Branch is a 48 y.o. female with a history of HTN, HLD, tobacco abuse, menorrhagia, postpartum cardiopathy and chronic systolic CHF who presented to the [facility] ED on 07/31/2013 with increasing orthopnea, PND, and LE edema. In the ED she was found to be hypokalemic (K 3.3) and her ECG showed NSR with new ST & T wave abnormalities. She was admitted for observation, IV diuresis, serial cardiac enzymes.   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 Repeat ECHO on this admission revealed EF of 25-30%, left ventricle dilation, mod LVH, diffuse hypokinesis and mild mitral regurgitation.  -- She admitted to eating more fast food recently and was educated on the importance of Na restriction.  -- She was given 20mg  IV Lasix x1 and 40mg  IV Lasix x2 with good diuresis -- Net loss of 6lbs and negative 1.5L -- Discharge weight 193lbs  -- Continue coreg, and spironolactone. No ACE inhibitor due to allergy. Consider adding an ARB as an outpatient. Per Dr. Caryl Comes notes, she was previously on an ARB. Not sure with her current insurance situation if she can afford  Hypokalemia -- She has not been taking any Lasix at home in the recent past due to difficulties with hypokalemia. On admission her K was 3.3. She was given 2 doses of Kdur 62mEq which repleted her K to 3.7. After diuresis it dropped to 2.9 today. She was given a dose of 40 mEq this morning and  will be given K supplementation along with her Lasix at home. Additionally, she will be restarting spironolactone which may help maintain her K levels. Consider the addition of an ARB as an outpatient if able to afford. -- BMET on Monday  EKG changes-  NSR with new ST & T wave abnormalities. -- She ruled out for MI with serial cardiac enzymes. She did not complain of any chest pain.   HTN -- She was quite hypertensive when she arrived (174/105). Now BP stable. Continue Coreg and hydralazine  HLD -- Continue statin  Low TSH  -- 0.155- this will need follow up as an outpatient at primary care clinic and possibly by endocrinology. She is currently on BB therapy -- Will add on a free T3- T4 to lab work on Monday   DISPO: Chelsea Branch is recovering well. She ambulated the halls ~200 feet with no episodes of shortness of breath or syncope. The patient has been seen by Dr. Acie Fredrickson today and deemed stable for discharge home. All follow-up appointments have been scheduled. Smoking cessation and salt avoidance were disscussed in length. The case manager has worked with her to obtain an orange card and set up an appointment with an outpatient primary care clinic. She has agreed to follow up there. Discharge medications include Coreg 3.125 mg BID, hydralazine 25 mg BID, Lasix 40 mg a day, spironolactone 25 mg qd, potassium chloride 20 mEq BID,  Pravastatin 20 mg qd. She was written  a medical excuse note for work but left before she could get it.    Discharge Vitals: Blood pressure 128/77, pulse 82, temperature 97.8 F (36.6 C), temperature source Oral, resp. rate 18, height 5\' 8"  (1.727 m), weight 193 lb 12.8 oz (87.907 kg), SpO2 98.00%.  Labs: Lab Results  Component Value Date   WBC 4.1 08/01/2013   HGB 10.9* 08/01/2013   HCT 35.0* 08/01/2013   MCV 69.2* 08/01/2013   PLT 227 08/01/2013     Recent Labs Lab 07/31/13 1155 08/01/13 0538  NA 139 139  K 3.7 2.9*  CL 102 102  CO2 21 24  BUN 5* 8   CREATININE 0.62 0.69  CALCIUM 8.9 8.6  PROT 7.2  --   BILITOT 0.7  --   ALKPHOS 115  --   ALT 13  --   AST 19  --   GLUCOSE 84 118*    Recent Labs  07/31/13 1155 07/31/13 1828 07/31/13 2345  TROPONINI <0.30 <0.30 <0.30    Diagnostic Studies/Procedures   Dg Chest 2 View  07/31/2013   CLINICAL DATA:  Shortness of Breath  EXAM: CHEST  2 VIEW  COMPARISON:  July 03, 2011  FINDINGS: There is slight scarring in the left base. Lungs are otherwise clear. Heart is upper normal in size with normal pulmonary vascularity. No adenopathy. No bone lesions.  IMPRESSION: No edema or consolidation.     2D ECHO: 07/31/2013 ------------------------------------------------------------ LV EF: 25% - 30% History: PMH: Congestive heart failure. Risk factors: Current tobacco use. Hypertension. ------------------------------------------------------------ Study Conclusions - Left ventricle: The cavity size was moderately dilated. Wall thickness was increased in a pattern of moderate LVH. Systolic function was severely reduced. The estimated ejection fraction was in the range of 25% to 30%. Diffuse hypokinesis. The study is not technically sufficient to allow evaluation of LV diastolic function. - Mitral valve: Mild regurgitation.  Discharge Medications     Medication List         acetaminophen 500 MG tablet  Commonly known as:  TYLENOL  Take 1,000 mg by mouth every 6 (six) hours as needed for mild pain or moderate pain.     carvedilol 3.125 MG tablet  Commonly known as:  COREG  Take 1 tablet (3.125 mg total) by mouth 2 (two) times daily with a meal.     furosemide 40 MG tablet  Commonly known as:  LASIX  Take 1 tablet (40 mg total) by mouth daily.     hydrALAZINE 25 MG tablet  Commonly known as:  APRESOLINE  Take 1 tablet (25 mg total) by mouth 2 (two) times daily.     ibuprofen 200 MG tablet  Commonly known as:  ADVIL,MOTRIN  Take 400 mg by mouth every 6 (six) hours as needed  for mild pain or moderate pain.     potassium chloride SA 20 MEQ tablet  Commonly known as:  KLOR-CON M20  Take 1 tablet (20 mEq total) by mouth 2 (two) times daily.     pravastatin 20 MG tablet  Commonly known as:  PRAVACHOL  Take 1 tablet (20 mg total) by mouth daily.     spironolactone 25 MG tablet  Commonly known as:  ALDACTONE  Take 1 tablet (25 mg total) by mouth daily.        Disposition   The patient will be discharged in stable condition to home.  Future Appointments Provider Department Dept Phone   08/06/2013 11:00 AM [facility] [place] Office [phone]  08/10/2013 4:00 PM [facility] Covering Provider [place] [phone]   08/10/2013 5:00 PM [facility] Financial Counselor [place] [phone]   08/14/2013 3:30 PM Rogelia Mire, NP [place] Office [phone]   08/27/2013 11:15 AM Deboraha Sprang, MD [place] Office [phone]     Follow-up Information   Follow up with Virl Axe, MD On 08/27/2013. (@ 11:15am)    Specialty:  Cardiology   Contact information:   [id number] [place] [address] [phone]       Follow up with Murray Hodgkins, NP On 08/14/2013. (@3 :30 pm)    Specialty:  Nurse Practitioner   Contact information:   [id number] [place] [address] [phone]       Follow up with [place] On 08/06/2013. (Please go to lab for bloodwork on Friday anytime between 7:30am and 4pm )    Contact information:   [address] [place] [address]         Duration of Discharge Encounter: Greater than 30 minutes including physician and PA time.  Tyrell Antonio PA-C 08/01/2013, 2:48 PM  Attending Note:   The patient was seen and examined.  Agree with assessment and plan as noted above.  Changes made to the above note as needed.  She is stable for DC.   I walked with her about 200 feet and she had no difficulty.  Thayer Headings, Brooke Bonito., MD, [facility] 08/01/2013, 5:20 PM

## 2013-08-01 NOTE — Discharge Instructions (Signed)
Heart Failure Heart failure means your heart has trouble pumping blood. This makes it hard for your body to work well. Heart failure is usually a long-term (chronic) condition. You must take good care of yourself and follow your doctor's treatment plan. HOME CARE  Take your heart medicine as told by your doctor.  Do not stop taking medicine unless your doctor tells you to.  Do not skip any dose of medicine.  Refill your medicines before they run out.  Take other medicines only as told by your doctor or pharmacist.  Stay active if told by your doctor. The elderly and people with severe heart failure should talk with a doctor about physical activity.  Eat heart healthy foods. Choose foods that are without trans fat and are low in saturated fat, cholesterol, and salt (sodium). This includes fresh or frozen fruits and vegetables, fish, lean meats, fat-free or low-fat dairy foods, whole grains, and high-fiber foods. Lentils and dried peas and beans (legumes) are also good choices.  Limit salt if told by your doctor.  Cook in a healthy way. Roast, grill, broil, bake, poach, steam, or stir-fry foods.  Limit fluids as told by your doctor.  Weigh yourself every morning. Do this after you pee (urinate) and before you eat breakfast. Write down your weight to give to your doctor.  Take your blood pressure and write it down if your doctor tell you to.  Ask your doctor how to check your pulse. Check your pulse as told.  Lose weight if told by your doctor.  Stop smoking or chewing tobacco. Do not use gum or patches that help you quit without your doctor's approval.  Schedule and go to doctor visits as told.  Nonpregnant women should have no more than 1 drink a day. Men should have no more than 2 drinks a day. Talk to your doctor about drinking alcohol.  Stop illegal drug use.  Stay current with shots (immunizations).  Manage your health conditions as told by your doctor.  Learn to manage  your stress.  Rest when you are tired.  If it is really hot outside:  Avoid intense activities.  Use air conditioning or fans, or get in a cooler place.  Avoid caffeine and alcohol.  Wear loose-fitting, lightweight, and light-colored clothing.  If it is really cold outside:  Avoid intense activities.  Layer your clothing.  Wear mittens or gloves, a hat, and a scarf when going outside.  Avoid alcohol.  Learn about heart failure and get support as needed.  Get help to maintain or improve your quality of life and your ability to care for yourself as needed. GET HELP IF:   You gain 03 lb/1.4 kg or more in 1 day or 05 lb/2.3 kg in a week.  You are more short of breath than usual.  You cannot do your normal activities.  You tire easily.  You cough more than normal, especially with activity.  You have any or more puffiness (swelling) in areas such as your hands, feet, ankles, or belly (abdomen).  You cannot sleep because it is hard to breathe.  You feel like your heart is beating fast (palpitations).  You get dizzy or lightheaded when you stand up. GET HELP RIGHT AWAY IF:   You have trouble breathing.  There is a change in mental status, such as becoming less alert or not being able to focus.  You have chest pain or discomfort.  You faint. MAKE SURE YOU:   Understand these   instructions.  Will watch your condition.  Will get help right away if you are not doing well or get worse. Document Released: 02/17/2008 Document Revised: 09/04/2012 Document Reviewed: 12/09/2011 Exit[employer] Patient Information 2014 [employer].  

## 2013-08-02 NOTE — ED Provider Notes (Signed)
Medical screening examination/treatment/procedure(s) were performed by non-physician practitioner and as supervising physician I was immediately available for consultation/collaboration.   EKG Interpretation   Date/Time:  Tuesday July 31 2013 06:34:10 EDT Ventricular Rate:  95 PR Interval:  150 QRS Duration: 90 QT Interval:  362 QTC Calculation: 454 R Axis:   49 Text Interpretation:  Normal sinus rhythm ST \\T \ T wave abnormality,  consider lateral ischemia Abnormal ECG Confirmed by Jeneen Rinks  MD, [place]  [phone]) on 07/31/2013 7:19:49 AM        Tanna Furry, MD 08/02/13 1055

## 2013-08-03 ENCOUNTER — Other Ambulatory Visit: Payer: Self-pay

## 2013-08-06 ENCOUNTER — Other Ambulatory Visit: Payer: Self-pay

## 2013-08-09 ENCOUNTER — Other Ambulatory Visit (INDEPENDENT_AMBULATORY_CARE_PROVIDER_SITE_OTHER): Payer: Self-pay | Admitting: *Deleted

## 2013-08-09 DIAGNOSIS — E876 Hypokalemia: Secondary | ICD-10-CM

## 2013-08-09 DIAGNOSIS — Z01810 Encounter for preprocedural cardiovascular examination: Secondary | ICD-10-CM

## 2013-08-09 DIAGNOSIS — I428 Other cardiomyopathies: Secondary | ICD-10-CM

## 2013-08-09 LAB — BASIC METABOLIC PANEL
BUN: 7 mg/dL (ref 6–23)
CALCIUM: 8.6 mg/dL (ref 8.4–10.5)
CO2: 22 meq/L (ref 19–32)
Chloride: 104 mEq/L (ref 96–112)
Creatinine, Ser: 0.7 mg/dL (ref 0.4–1.2)
GFR: 109.21 mL/min (ref >60.00)
GLUCOSE: 106 mg/dL — AB (ref 70–99)
Potassium: 3.8 mEq/L (ref 3.5–5.1)
SODIUM: 136 meq/L (ref 135–145)

## 2013-08-10 ENCOUNTER — Ambulatory Visit: Payer: Medicaid Other | Attending: Internal Medicine | Admitting: Internal Medicine

## 2013-08-10 ENCOUNTER — Ambulatory Visit: Payer: Self-pay

## 2013-08-10 VITALS — BP 118/73 | HR 97 | Temp 98.7°F | Ht 68.0 in | Wt 195.6 lb

## 2013-08-10 DIAGNOSIS — I5022 Chronic systolic (congestive) heart failure: Secondary | ICD-10-CM | POA: Diagnosis present

## 2013-08-10 DIAGNOSIS — R5381 Other malaise: Secondary | ICD-10-CM | POA: Insufficient documentation

## 2013-08-10 DIAGNOSIS — E785 Hyperlipidemia, unspecified: Secondary | ICD-10-CM | POA: Insufficient documentation

## 2013-08-10 DIAGNOSIS — I059 Rheumatic mitral valve disease, unspecified: Secondary | ICD-10-CM | POA: Diagnosis not present

## 2013-08-10 DIAGNOSIS — Z8673 Personal history of transient ischemic attack (TIA), and cerebral infarction without residual deficits: Secondary | ICD-10-CM | POA: Insufficient documentation

## 2013-08-10 DIAGNOSIS — I509 Heart failure, unspecified: Secondary | ICD-10-CM | POA: Insufficient documentation

## 2013-08-10 DIAGNOSIS — F17200 Nicotine dependence, unspecified, uncomplicated: Secondary | ICD-10-CM | POA: Diagnosis not present

## 2013-08-10 DIAGNOSIS — Z79899 Other long term (current) drug therapy: Secondary | ICD-10-CM | POA: Insufficient documentation

## 2013-08-10 DIAGNOSIS — I10 Essential (primary) hypertension: Secondary | ICD-10-CM | POA: Diagnosis not present

## 2013-08-10 DIAGNOSIS — R5383 Other fatigue: Secondary | ICD-10-CM

## 2013-08-10 DIAGNOSIS — I428 Other cardiomyopathies: Secondary | ICD-10-CM | POA: Diagnosis not present

## 2013-08-10 LAB — T4, FREE: Free T4: 1.27 ng/dL (ref 0.80–1.80)

## 2013-08-10 LAB — TSH: TSH: 0.392 u[IU]/mL (ref 0.350–4.500)

## 2013-08-10 NOTE — Progress Notes (Signed)
Patient is here today for a hospital follow up for SOB & CHF. Patient O2 is 98 and BP 118/73

## 2013-08-10 NOTE — Progress Notes (Signed)
[name], female   DOB: 03-Nov-1965, 48 y.o.   MRN: [id number]   CC:  HPI: 48 y.o. female with a history of HTN, HLD, tobacco abuse, menorrhagia, postpartum cardiopathy and chronic systolic CHF who presented to the [facility] ED on 07/31/2013 with increasing orthopnea, PND, and LE edema. In the ED she was found to be hypokalemic (K 3.3) and her ECG showed NSR with new ST & T wave abnormalities. She was admitted for observation, IV diuresis, serial cardiac enzymes.  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 Repeat ECHO on this admission revealed EF of 25-30%, left ventricle dilation, mod LVH, diffuse hypokinesis and mild mitral regurgitation.  Patient has been compliant with her sodium restriction, started on Lasix and Aldactone for her hypokalemia. She has a cardiology appointment next week. She states that she feels unusually tired. She has to lift heavy boxes and weight. She denied any chest pain any shortness of breath.    Allergies  Allergen Reactions  . Ace Inhibitors     REACTION: Cough   Past Medical History  Diagnosis Date  . Chronic systolic heart failure     a. 07/2013  EF of 25-30%, LV, mod LVH, diffuse hypokinesis and mild MR  . Hypertension   . MITRAL REGURGITATION 10/15/2008  . DYSLIPIDEMIA 06/08/2010  . 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 Alopecia   . 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 Noncompliance   . Tobacco abuse   . Fibroids   . Abnormal uterine bleeding   . Stroke   . Menorrhagia   . Peripartum cardiomyopathy    Current Outpatient Prescriptions on File Prior to Visit  Medication Sig Dispense Refill  . carvedilol (COREG) 3.125 MG tablet Take 1 tablet (3.125 mg total) by mouth 2 (two) times daily with a meal.  180 tablet  3  . furosemide (LASIX) 40 MG tablet Take 1  tablet (40 mg total) by mouth daily.  30 tablet  6  . hydrALAZINE (APRESOLINE) 25 MG tablet Take 1 tablet (25 mg total) by mouth 2 (two) times daily.  180 tablet  3  . potassium chloride SA (KLOR-CON M20) 20 MEQ tablet Take 1 tablet (20 mEq total) by mouth 2 (two) times daily.  180 tablet  3  . pravastatin (PRAVACHOL) 20 MG tablet Take 1 tablet (20 mg total) by mouth daily.  90 tablet  4  . spironolactone (ALDACTONE) 25 MG tablet Take 1 tablet (25 mg total) by mouth daily.  30 tablet  6  . acetaminophen (TYLENOL) 500 MG tablet Take 1,000 mg by mouth every 6 (six) hours as needed for mild pain or moderate pain.       No current facility-administered medications on file prior to visit.   Family History  Problem Relation Age of Onset  . Other Neg Hx   . Cancer Maternal Grandmother     uterine  . Hypertension Sister    History   Social History  . Marital Status: Single    Spouse Name: N/A    Number of Children: N/A  . Years of Education: N/A   Occupational History  . Not on file.   Social History Main Topics  . Smoking status: Current Every Day Smoker -- 0.12 packs/day for 30 years    Types: Cigarettes  . Smokeless tobacco: Never  Used  . Alcohol Use: No  . Drug Use: No  . Sexual Activity: Yes    Birth Control/ Protection: IUD   Other Topics Concern  . Not on file   Social History Narrative  . No narrative on file    Review of Systems  Constitutional: Negative for fever, chills, diaphoresis, activity change, appetite change and fatigue.  HENT: Negative for ear pain, nosebleeds, congestion, facial swelling, rhinorrhea, neck pain, neck stiffness and ear discharge.   Eyes: Negative for pain, discharge, redness, itching and visual disturbance.  Respiratory: Negative for cough, choking, chest tightness, shortness of breath, wheezing and stridor.   Cardiovascular: Negative for chest pain, palpitations and leg swelling.  Gastrointestinal: Negative for abdominal distention.   Genitourinary: Negative for dysuria, urgency, frequency, hematuria, flank pain, decreased urine volume, difficulty urinating and dyspareunia.  Musculoskeletal: Negative for back pain, joint swelling, arthralgias and gait problem.  Neurological: Negative for dizziness, tremors, seizures, syncope, facial asymmetry, speech difficulty, weakness, light-headedness, numbness and headaches.  Hematological: Negative for adenopathy. Does not bruise/bleed easily.  Psychiatric/Behavioral: Negative for hallucinations, behavioral problems, confusion, dysphoric mood, decreased concentration and agitation.    Objective:   Filed Vitals:   08/10/13 1525  BP: 118/73  Pulse: 97  Temp: 98.7 F (37.1 C)    Physical Exam  Constitutional: Appears well-developed and well-nourished. No distress.  HENT: Normocephalic. External right and left ear normal. Oropharynx is clear and moist.  Eyes: Conjunctivae and EOM are normal. PERRLA, no scleral icterus.  Neck: Normal ROM. Neck supple. No JVD. No tracheal deviation. No thyromegaly.  CVS: RRR, S1/S2 +, no murmurs, no gallops, no carotid bruit.  Pulmonary: Effort and breath sounds normal, no stridor, rhonchi, wheezes, rales.  Abdominal: Soft. BS +,  no distension, tenderness, rebound or guarding.  Musculoskeletal: Normal range of motion. No edema and no tenderness.  Lymphadenopathy: No lymphadenopathy noted, cervical, inguinal. Neuro: Alert. Normal reflexes, muscle tone coordination. No cranial nerve deficit. Skin: Skin is warm and dry. No rash noted. Not diaphoretic. No erythema. No pallor.  Psychiatric: Normal mood and affect. Behavior, judgment, thought content normal.   Lab Results  Component Value Date   WBC 4.1 08/01/2013   HGB 10.9* 08/01/2013   HCT 35.0* 08/01/2013   MCV 69.2* 08/01/2013   PLT 227 08/01/2013   Lab Results  Component Value Date   CREATININE 0.7 08/09/2013   BUN 7 08/09/2013   NA 136 08/09/2013   K 3.8 08/09/2013   CL 104 08/09/2013   CO2  22 08/09/2013    Lab Results  Component Value Date   HGBA1C  Value: 6.0 (NOTE)                                                                       According to the ADA Clinical Practice Recommendations for 2011, when HbA1c is used as a screening test:   >=6.5%   Diagnostic of Diabetes Mellitus           (if abnormal result  is confirmed)  5.7-6.4%   Increased risk of developing Diabetes Mellitus  References:Diagnosis and Classification of Diabetes Mellitus,Diabetes ALPF,7902,40(XBDZH 1):S62-S69 and Standards of Medical Care in         Diabetes - 2011,Diabetes Care,2011,34  (Suppl 1):S11-S61.*  05/18/2010   Lipid Panel     Component Value Date/Time   CHOL  Value: 143        ATP III CLASSIFICATION:  <200     mg/dL   Desirable  200-239  mg/dL   Borderline High  >=240    mg/dL   High        05/18/2010 0330   TRIG 71 05/18/2010 0330   HDL 38* 05/18/2010 0330   CHOLHDL 3.8 05/18/2010 0330   VLDL 14 05/18/2010 0330   LDLCALC  Value: 91        Total Cholesterol/HDL:CHD Risk Coronary Heart Disease Risk Table                     Men   Women  1/2 Average Risk   3.4   3.3  Average Risk       5.0   4.4  2 X Average Risk   9.6   7.1  3 X Average Risk  23.4   11.0        Use the calculated Patient Ratio above and the CHD Risk Table to determine the patient's CHD Risk.        ATP III CLASSIFICATION (LDL):  <100     mg/dL   Optimal  100-129  mg/dL   Near or Above                    Optimal  130-159  mg/dL   Borderline  160-189  mg/dL   High  >190     mg/dL   Very High 05/18/2010 0330       Assessment and plan:   Patient Active Problem List   Diagnosis Date Noted  . Acute on chronic systolic CHF (congestive heart failure) 08/01/2013  . Stroke   . Tobacco abuse   . Hypertension   . Congestive heart failure with left ventricular diastolic dysfunction 31/51/7616  . Preoperative cardiovascular examination 12/07/2012  . Fibroids 08/09/2012  . Menorrhagia 05/08/2012  . Nonischemic cardiomyopathy   . Alopecia    . Chronic systolic heart failure   . GLUCOSE INTOLERANCE 06/08/2010  . DYSLIPIDEMIA 06/08/2010  . CEREBRAL ANEURYSM 06/08/2010  . 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 CEREBROVASCULAR ACCIDENT, HX OF 06/08/2010  . CHF 10/06/2009  . HYPOKALEMIA 09/07/2009  . MITRAL REGURGITATION 10/15/2008  . HYPERTENSION, UNSPECIFIED 10/15/2008    chronic systolic heart failure Patient is a cardiology next week therefore no change in medications at this point Continue Lasix, Coreg, hydralazine, Aldactone   History of glucose intolerance A1c in 2011 was 6.1 Repeat A1c and lipid panel today next   Fatigue Probably related to chronic congestive heart failure We'll check TSH, free T4 because of low TSH of 0.155, vitamin D  Follow up in 2 months        The patient was given clear instructions to go to ER or return to medical center if symptoms don't improve, worsen or new problems develop. The patient verbalized understanding. The patient was told to call to get any lab results if not heard anything in the next week.

## 2013-08-11 LAB — VITAMIN D 25 HYDROXY (VIT D DEFICIENCY, FRACTURES): VIT D 25 HYDROXY: 16 ng/mL — AB (ref 30–89)

## 2013-08-14 ENCOUNTER — Encounter: Payer: Self-pay | Admitting: Nurse Practitioner

## 2013-08-14 ENCOUNTER — Ambulatory Visit (INDEPENDENT_AMBULATORY_CARE_PROVIDER_SITE_OTHER): Payer: Self-pay | Admitting: Nurse Practitioner

## 2013-08-14 ENCOUNTER — Emergency Department (HOSPITAL_COMMUNITY)
Admission: EM | Admit: 2013-08-14 | Discharge: 2013-08-14 | Disposition: A | Discharge status: Home / Self Care | Payer: Medicaid Other | Attending: Emergency Medicine | Admitting: Emergency Medicine

## 2013-08-14 ENCOUNTER — Encounter (HOSPITAL_COMMUNITY): Payer: Self-pay | Admitting: Emergency Medicine

## 2013-08-14 VITALS — BP 128/68 | HR 96 | Ht 68.0 in | Wt 194.8 lb

## 2013-08-14 DIAGNOSIS — Q054 Unspecified spina bifida with hydrocephalus: Secondary | ICD-10-CM | POA: Diagnosis not present

## 2013-08-14 DIAGNOSIS — Z8742 Personal history of other diseases of the female genital tract: Secondary | ICD-10-CM | POA: Diagnosis not present

## 2013-08-14 DIAGNOSIS — I5042 Chronic combined systolic (congestive) and diastolic (congestive) heart failure: Secondary | ICD-10-CM | POA: Diagnosis not present

## 2013-08-14 DIAGNOSIS — Z79899 Other long term (current) drug therapy: Secondary | ICD-10-CM | POA: Insufficient documentation

## 2013-08-14 DIAGNOSIS — I10 Essential (primary) hypertension: Secondary | ICD-10-CM | POA: Diagnosis not present

## 2013-08-14 DIAGNOSIS — E785 Hyperlipidemia, unspecified: Secondary | ICD-10-CM | POA: Diagnosis not present

## 2013-08-14 DIAGNOSIS — I5022 Chronic systolic (congestive) heart failure: Secondary | ICD-10-CM

## 2013-08-14 DIAGNOSIS — Z9851 Tubal ligation status: Secondary | ICD-10-CM | POA: Diagnosis not present

## 2013-08-14 DIAGNOSIS — R109 Unspecified abdominal pain: Secondary | ICD-10-CM

## 2013-08-14 DIAGNOSIS — Z8673 Personal history of transient ischemic attack (TIA), and cerebral infarction without residual deficits: Secondary | ICD-10-CM | POA: Diagnosis not present

## 2013-08-14 DIAGNOSIS — F17200 Nicotine dependence, unspecified, uncomplicated: Secondary | ICD-10-CM | POA: Diagnosis not present

## 2013-08-14 DIAGNOSIS — Z9119 Patient's noncompliance with other medical treatment and regimen: Secondary | ICD-10-CM | POA: Diagnosis not present

## 2013-08-14 DIAGNOSIS — Z872 Personal history of diseases of the skin and subcutaneous tissue: Secondary | ICD-10-CM | POA: Diagnosis not present

## 2013-08-14 DIAGNOSIS — I428 Other cardiomyopathies: Secondary | ICD-10-CM

## 2013-08-14 DIAGNOSIS — Z720 Tobacco use: Secondary | ICD-10-CM

## 2013-08-14 DIAGNOSIS — R1013 Epigastric pain: Secondary | ICD-10-CM | POA: Insufficient documentation

## 2013-08-14 DIAGNOSIS — I509 Heart failure, unspecified: Secondary | ICD-10-CM

## 2013-08-14 DIAGNOSIS — Z91199 Patient's noncompliance with other medical treatment and regimen due to unspecified reason: Secondary | ICD-10-CM | POA: Insufficient documentation

## 2013-08-14 LAB — CBC WITH DIFFERENTIAL/PLATELET
BASOS PCT: 0 % (ref 0–1)
Basophils Absolute: 0 10*3/uL (ref 0.0–0.1)
Eosinophils Absolute: 0.2 10*3/uL (ref 0.0–0.7)
Eosinophils Relative: 2 % (ref 0–5)
HEMATOCRIT: 38.8 % (ref 36.0–46.0)
HEMOGLOBIN: 12.3 g/dL (ref 12.0–15.0)
LYMPHS ABS: 3.1 10*3/uL (ref 0.7–4.0)
Lymphocytes Relative: 41 % (ref 12–46)
MCH: 22.5 pg — ABNORMAL LOW (ref 26.0–34.0)
MCHC: 31.7 g/dL (ref 30.0–36.0)
MCV: 70.9 fL — AB (ref 78.0–100.0)
MONOS PCT: 8 % (ref 3–12)
Monocytes Absolute: 0.6 10*3/uL (ref 0.1–1.0)
NEUTROS ABS: 3.7 10*3/uL (ref 1.7–7.7)
Neutrophils Relative %: 49 % (ref 43–77)
Platelets: 307 10*3/uL (ref 150–400)
RBC: 5.47 MIL/uL — ABNORMAL HIGH (ref 3.87–5.11)
RDW: 21.3 % — ABNORMAL HIGH (ref 11.5–15.5)
WBC: 7.6 10*3/uL (ref 4.0–10.5)

## 2013-08-14 LAB — COMPREHENSIVE METABOLIC PANEL
ALT: 13 U/L (ref 0–35)
AST: 12 U/L (ref 0–37)
Albumin: 3.5 g/dL (ref 3.5–5.2)
Alkaline Phosphatase: 128 U/L — ABNORMAL HIGH (ref 39–117)
BUN: 13 mg/dL (ref 6–23)
CHLORIDE: 103 meq/L (ref 96–112)
CO2: 24 meq/L (ref 19–32)
Calcium: 9.4 mg/dL (ref 8.4–10.5)
Creatinine, Ser: 0.84 mg/dL (ref 0.50–1.10)
GFR calc Af Amer: >90 mL/min (ref >90)
GFR, EST NON AFRICAN AMERICAN: 81 mL/min — AB (ref >90)
GLUCOSE: 108 mg/dL — AB (ref 70–99)
Potassium: 4 mEq/L (ref 3.7–5.3)
SODIUM: 140 meq/L (ref 137–147)
TOTAL PROTEIN: 7.6 g/dL (ref 6.0–8.3)
Total Bilirubin: 0.3 mg/dL (ref 0.3–1.2)

## 2013-08-14 LAB — LIPASE, BLOOD: Lipase: 35 U/L (ref 11–59)

## 2013-08-14 NOTE — ED Provider Notes (Signed)
CSN: [id number]     Arrival date & time 08/14/13  0238 History   First MD Initiated Contact with Patient 08/14/13 [phone]     Chief Complaint  Patient presents with  . Abdominal Pain     (Consider location/radiation/quality/duration/timing/severity/associated sxs/prior Treatment) Patient is a 48 y.o. female presenting with abdominal pain. The history is provided by the patient. No language interpreter was used.  Abdominal Pain Pain location:  Epigastric Associated symptoms: no chills, no fever and no vomiting   Associated symptoms comment:  She woke around 1:30 a.m. Experiencing epigastric pain and cramping that lasted approximately 10 minutes, then resolved. She reports her thighs were cramping as well. No chest pain or SOB. No vomiting.    Past Medical History  Diagnosis Date  . Chronic systolic heart failure     a. 07/2013  EF of 25-30%, LV, mod LVH, diffuse hypokinesis and mild MR  . Hypertension   . MITRAL REGURGITATION 10/15/2008  . DYSLIPIDEMIA 06/08/2010  . 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 Alopecia   . 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 Noncompliance   . Tobacco abuse   . Fibroids   . Abnormal uterine bleeding   . Stroke   . Menorrhagia   . Peripartum cardiomyopathy    Past Surgical History  Procedure Laterality Date  . Loop recorder implant  ~ 2000  . Cesarean section  1992  1994  . Tubal ligation  1994  . Loop recorder explant     Family History  Problem Relation Age of Onset  . Other Neg Hx   . Cancer Maternal Grandmother     uterine  . Hypertension Sister    History  Substance Use Topics  . Smoking status: Current Every Day Smoker -- 0.12 packs/day for 30 years    Types: Cigarettes  . Smokeless tobacco: Never Used  . Alcohol Use: No   OB History   Grav Para Term Preterm Abortions TAB SAB Ect Mult Living   2 2 2      1 3      Review of Systems  Constitutional: Negative for  fever and chills.  Respiratory: Negative.   Cardiovascular: Negative.   Gastrointestinal: Positive for abdominal pain. Negative for vomiting.  Musculoskeletal: Negative.   Skin: Negative.   Neurological: Negative.       Allergies  Ace inhibitors  Home Medications   Current Outpatient Rx  Name  Route  Sig  Dispense  Refill  . carvedilol (COREG) 3.125 MG tablet   Oral   Take 1 tablet (3.125 mg total) by mouth 2 (two) times daily with a meal.   180 tablet   3   . furosemide (LASIX) 40 MG tablet   Oral   Take 40 mg by mouth daily.         . hydrALAZINE (APRESOLINE) 25 MG tablet   Oral   Take 1 tablet (25 mg total) by mouth 2 (two) times daily.   180 tablet   3   . ibuprofen (ADVIL,MOTRIN) 800 MG tablet   Oral   Take 800 mg by mouth every 8 (eight) hours as needed for moderate pain.         . potassium chloride SA (K-DUR,KLOR-CON) 20 MEQ tablet   Oral   Take 20 mEq by mouth 2 (two) times daily.         . pravastatin (PRAVACHOL) 20 MG tablet   Oral   Take 1  tablet (20 mg total) by mouth daily.   90 tablet   4   . spironolactone (ALDACTONE) 25 MG tablet   Oral   Take 25 mg by mouth daily.          BP 113/77  Pulse 77  Temp(Src) 98.7 F (37.1 C) (Oral)  Resp 18  SpO2 99% Physical Exam  Constitutional: She is oriented to person, place, and time. She appears well-developed and well-nourished.  HENT:  Head: Normocephalic.  Neck: Normal range of motion. Neck supple.  Cardiovascular: Normal rate and regular rhythm.   Pulmonary/Chest: Effort normal and breath sounds normal.  Abdominal: Soft. Bowel sounds are normal. There is no tenderness. There is no rebound and no guarding.  Musculoskeletal: Normal range of motion.  Neurological: She is alert and oriented to person, place, and time.  Skin: Skin is warm and dry. No rash noted.  Psychiatric: She has a normal mood and affect.    ED Course  Procedures (including critical care time) Labs Review Labs  Reviewed  CBC WITH DIFFERENTIAL - Abnormal; Notable for the following:    RBC 5.47 (*)    MCV 70.9 (*)    MCH 22.5 (*)    RDW 21.3 (*)    All other components within normal limits  COMPREHENSIVE METABOLIC PANEL - Abnormal; Notable for the following:    Glucose, Bld 108 (*)    Alkaline Phosphatase 128 (*)    GFR calc non Af Amer 81 (*)    All other components within normal limits  LIPASE, BLOOD  PREGNANCY, URINE  URINALYSIS, ROUTINE W REFLEX MICROSCOPIC   Imaging Review No results found.   EKG Interpretation None      MDM   Final diagnoses:  None    1. Abdominal pain, resolved  She is tolerating PO fluids without further symptoms. Lab studies reassuring. VSS. No chest pain or breathing difficulties. Encouraged to follow up with PCP for recheck if symptoms recur.     Dewaine Oats, PA-C 08/14/13 0745

## 2013-08-14 NOTE — Patient Instructions (Signed)
Your physician recommends that you continue on your current medications as directed. Please refer to the Current Medication list given to you today.  Your physician recommends that you keep your follow-up appointment with Dr. Caryl Comes on April 6, at 11:15 am.

## 2013-08-14 NOTE — ED Provider Notes (Signed)
Medical screening examination/treatment/procedure(s) were performed by non-physician practitioner and as supervising physician I was immediately available for consultation/collaboration.   EKG Interpretation None        Wynetta Fines, MD 08/14/13 [phone]

## 2013-08-14 NOTE — ED Notes (Signed)
Attempted to collect urine sample, pt unable to void at this time.

## 2013-08-14 NOTE — Progress Notes (Signed)
Patient Name: Chelsea Branch Date of Encounter: 08/14/2013  Primary Care Provider:  Derrek Gu Primary Cardiologist:  Olin Pia, MD   Patient Profile  48 y/o female with a h/o NICM who presents for f/u after recent CHF hospitalization.  Problem List   Past Medical History  Diagnosis Date  . Chronic combined systolic and diastolic CHF (congestive heart failure)     a. 07/2013  EF of 25-30%, LV, mod LVH, diffuse hypokinesis and mild MR  . Hypertension   . MITRAL REGURGITATION 10/15/2008  . DYSLIPIDEMIA 06/08/2010  . 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 Alopecia   . Nonischemic cardiomyopathy     iniatially presumed 2/2 peripartum with improvement and then worsening - EF 25-30% by echo 07/2013.  Marland Kitchen Noncompliance   . Tobacco abuse   . Fibroids   . Abnormal uterine bleeding   . Stroke   . Menorrhagia   . Peripartum cardiomyopathy    Past Surgical History  Procedure Laterality Date  . Loop recorder implant  ~ 2000  . Cesarean section  1992  1994  . Tubal ligation  1994  . Loop recorder explant      Allergies  Allergies  Allergen Reactions  . Ace Inhibitors     REACTION: Cough    HPI  48 y/o female with a h/o NICM in the setting of prior peripartum cardiomyopathy with an EF of 25%, improving by 2008, but then dropping again to 30-35% by echo in July 2014, and more recently to 25-30% by echo earlier this month.  She was admitted to [facility] earlier this months 2/2 volume overload and acute CHF. She was diuresed with a resultant 6 lb wt loss and d/c weight of 193 lbs.  Since d/c, she hasn't been weighing herself but thinks that her wt has been stable.  She is chronically fatigued but denies chest pain, palpitations, dyspnea, pnd, orthopnea, n, v, dizziness, syncope, edema, weight gain, or early satiety.  She reports compliance with her meds.  She was seen in the ER overnight 2/2 abdominal pain. W/U was unrevealing and she was d/c'd home.  Home Medications  Prior to Admission  medications   Medication Sig Start Date End Date Taking? Authorizing Provider  carvedilol (COREG) 3.125 MG tablet Take 1 tablet (3.125 mg total) by mouth 2 (two) times daily with a meal. 04/13/13  Yes Deboraha Sprang, MD  furosemide (LASIX) 40 MG tablet Take 40 mg by mouth daily. 08/01/13  Yes Perry Mount, PA-C  hydrALAZINE (APRESOLINE) 25 MG tablet Take 1 tablet (25 mg total) by mouth 2 (two) times daily. 04/13/13 04/13/14 Yes Deboraha Sprang, MD  ibuprofen (ADVIL,MOTRIN) 800 MG tablet Take 800 mg by mouth every 8 (eight) hours as needed for moderate pain.   Yes Historical Provider, MD  potassium chloride SA (K-DUR,KLOR-CON) 20 MEQ tablet Take 20 mEq by mouth 2 (two) times daily. 08/01/13  Yes Perry Mount, PA-C  pravastatin (PRAVACHOL) 20 MG tablet Take 1 tablet (20 mg total) by mouth daily. 04/13/13  Yes Deboraha Sprang, MD  spironolactone (ALDACTONE) 25 MG tablet Take 25 mg by mouth daily. 08/01/13  Yes Perry Mount, PA-C    Review of Systems  As above, she is chronically fatigued. She had abd pain last night and this AM and was seen in the ED with unrevealing w/u.  She denies chest pain, palpitations, dyspnea, pnd, orthopnea, n, v, dizziness, syncope, edema, weight gain, or early satiety.  All other systems reviewed and are otherwise negative except  as noted above.  Physical Exam  Blood pressure 128/68, pulse 96, height 5\' 8"  (1.727 m), weight 194 lb 12.8 oz (88.361 kg).  General: Pleasant, NAD Psych: flat affect. Neuro: Alert and oriented X 3. Moves all extremities spontaneously. HEENT: Normal  Neck: Supple without bruits or JVD. Lungs:  Resp regular and unlabored, diminished breath sounds bilat. Heart: RRR no s3, s4, or murmurs. Abdomen: Soft, non-tender, non-distended, BS + x 4.  Extremities: No clubbing, cyanosis.  Trace bilat LE edema. DP/PT/Radials 2+ and equal bilaterally.  Accessory Clinical Findings  ECG - rsr, 96, lvh with inf/antlat st dep and twi - no acute  changes.  Assessment & Plan  1.  Chronic combined syst/diast chf/NICM:  S/p hospitalization earlier this month.  She has done well w/o dyspnea, orthopnea, or significant edema since d/c but hasn't been weighing herself and doesn't even own a scale.  She has been compliant with her meds.  I stressed the importance of daily weights, symptom reporting, med compliance, and sodium restriction.  She verbalized understanding and other than not weighing herself, appears to be doing a good job @ home.  We discussed adding ARB therapy (intol to ACEI) to her regimen but she does not think that she would be able to afford losartan, which is currently running @ $30/month minimum.  Thus we will stick with bb/hydralazine/spironolactone.  BP is currently well controlled.  She has f/u with Dr. Caryl Comes on 4/6 - ? ICD.  2.  HTN:  Stable on bb/hydral/spiro/lasix.  3.  Tob Abuse:  We've chosen 4/1 as her quit date.  She isn't sure that she's ready to quit but it's her twin's bday and she's willing to give it a shot.  4. Dispo: f/u w/ Dr. Caryl Comes as scheduled.   Murray Hodgkins, NP 08/14/2013, 3:49 PM

## 2013-08-14 NOTE — ED Notes (Addendum)
Pt to ED via [facility] with c/o upper abd pain with nausea, onset approx 1 1/2 hrs ago.  Diarrhea x's 1

## 2013-08-14 NOTE — ED Notes (Signed)
Chelsea Settle, PA at bedside for evaluation.

## 2013-08-14 NOTE — Discharge Instructions (Signed)
Abdominal Pain, Adult °Many things can cause abdominal pain. Usually, abdominal pain is not caused by a disease and will improve without treatment. It can often be observed and treated at home. Your health care provider will do a physical exam and possibly order blood tests and X-rays to help determine the seriousness of your pain. However, in many cases, more time must pass before a clear cause of the pain can be found. Before that point, your health care provider may not know if you need more testing or further treatment. °HOME CARE INSTRUCTIONS  °Monitor your abdominal pain for any changes. The following actions may help to alleviate any discomfort you are experiencing: °· Only take over-the-counter or prescription medicines as directed by your health care provider. °· Do not take laxatives unless directed to do so by your health care provider. °· Try a clear liquid diet (broth, tea, or water) as directed by your health care provider. Slowly move to a bland diet as tolerated. °SEEK MEDICAL CARE IF: °· You have unexplained abdominal pain. °· You have abdominal pain associated with nausea or diarrhea. °· You have pain when you urinate or have a bowel movement. °· You experience abdominal pain that wakes you in the night. °· You have abdominal pain that is worsened or improved by eating food. °· You have abdominal pain that is worsened with eating fatty foods. °SEEK IMMEDIATE MEDICAL CARE IF:  °· Your pain does not go away within 2 hours. °· You have a fever. °· You keep throwing up (vomiting). °· Your pain is felt only in portions of the abdomen, such as the right side or the left lower portion of the abdomen. °· You pass bloody or black tarry stools. °MAKE SURE YOU: °· Understand these instructions.   °· Will watch your condition.   °· Will get help right away if you are not doing well or get worse.   °Document Released: 02/17/2005 Document Revised: 02/28/2013 Document Reviewed: 01/17/2013 °[employer]® Patient  Information ©2014 [employer]. ° °

## 2013-08-15 ENCOUNTER — Other Ambulatory Visit: Payer: Self-pay

## 2013-08-15 DIAGNOSIS — I10 Essential (primary) hypertension: Secondary | ICD-10-CM

## 2013-08-15 MED ORDER — ERGOCALCIFEROL 1.25 MG (50000 UT) PO CAPS: 50000.0000 [IU] | ORAL_CAPSULE | ORAL | Status: DC

## 2013-08-15 NOTE — Addendum Note (Signed)
Addended by: Allyson Sabal MD, Ascencion Dike on: 08/15/2013 11:38 AM   Modules accepted: Orders

## 2013-08-16 ENCOUNTER — Telehealth: Payer: Self-pay | Admitting: Cardiovascular Disease

## 2013-08-16 NOTE — Telephone Encounter (Signed)
New message     Patients employer---return to work needs to be rewritten.  Please include restrictions--(pt is a housekeeper)--note need a out of work date from beginning to end.  The current note says return to work 08-27-13 but does not have a start out of work date.  Please fax note to [phone].  If possible, call them when note is faxed.

## 2013-08-16 NOTE — Telephone Encounter (Signed)
Spoke with Ignacia Bayley who saw pt 2 days ago & wrote note. Pt is seeing Dr. Caryl Comes on 08/27/13. Would like for Dr. Caryl Comes to review & write out of work note for pt tomorrow. Out of work date should start from 08/14/13 (date of hospitalization)  Will forward to Sherri to discuss with Dr. Royal Hawthorn RN

## 2013-08-17 ENCOUNTER — Telehealth: Payer: Self-pay | Admitting: Emergency Medicine

## 2013-08-17 ENCOUNTER — Telehealth: Payer: Self-pay | Admitting: Internal Medicine

## 2013-08-17 ENCOUNTER — Telehealth: Payer: Self-pay

## 2013-08-17 ENCOUNTER — Encounter: Payer: Self-pay | Admitting: *Deleted

## 2013-08-17 MED ORDER — PRAVASTATIN SODIUM 20 MG PO TABS
20.0000 mg | ORAL_TABLET | Freq: Every day | ORAL | Status: DC
Start: 2013-08-17 — End: 2013-08-27

## 2013-08-17 NOTE — Telephone Encounter (Signed)
Pt says she needs refill for heart medication until she can fix her medicaid, she has appt with them coming up this week.

## 2013-08-17 NOTE — Telephone Encounter (Signed)
Work excuse faxed. Left message to inform them faxed work excuse.

## 2013-08-17 NOTE — Telephone Encounter (Signed)
Spoke with pt. Medication Pravastatin reordered and e-scribed to [place]

## 2013-08-17 NOTE — Telephone Encounter (Signed)
Spoke with patient in reference to her vitamin D She was inquiring about what vitamin d is for

## 2013-08-20 NOTE — Telephone Encounter (Signed)
Message copied by Ricci Barker on Mon Aug 20, 2013  5:19 PM ------      Message from: Allyson Sabal MD, Ascencion Dike      Created: Wed Aug 15, 2013 11:37 AM       Notify patient of the thyroid function is normal. Vitamin D level of 16 therefore [facility] for vitamin D [facility] ------

## 2013-08-27 ENCOUNTER — Encounter: Payer: Self-pay | Admitting: Internal Medicine

## 2013-08-27 ENCOUNTER — Ambulatory Visit (INDEPENDENT_AMBULATORY_CARE_PROVIDER_SITE_OTHER): Payer: Self-pay | Admitting: Internal Medicine

## 2013-08-27 VITALS — BP 116/74 | HR 83 | Ht 68.0 in | Wt 197.0 lb

## 2013-08-27 DIAGNOSIS — I5042 Chronic combined systolic (congestive) and diastolic (congestive) heart failure: Secondary | ICD-10-CM

## 2013-08-27 DIAGNOSIS — G4719 Other hypersomnia: Secondary | ICD-10-CM

## 2013-08-27 DIAGNOSIS — I428 Other cardiomyopathies: Secondary | ICD-10-CM

## 2013-08-27 DIAGNOSIS — G4710 Hypersomnia, unspecified: Secondary | ICD-10-CM

## 2013-08-27 DIAGNOSIS — I509 Heart failure, unspecified: Secondary | ICD-10-CM

## 2013-08-27 LAB — BASIC METABOLIC PANEL
BUN: 7 mg/dL (ref 6–23)
CHLORIDE: 104 meq/L (ref 96–112)
CO2: 27 mEq/L (ref 19–32)
CREATININE: 0.8 mg/dL (ref 0.4–1.2)
Calcium: 9.2 mg/dL (ref 8.4–10.5)
GFR: 105.83 mL/min (ref >60.00)
Glucose, Bld: 88 mg/dL (ref 70–99)
Potassium: 4 mEq/L (ref 3.5–5.1)
Sodium: 138 mEq/L (ref 135–145)

## 2013-08-27 LAB — MAGNESIUM: Magnesium: 1.9 mg/dL (ref 1.5–2.5)

## 2013-08-27 MED ORDER — ISOSORBIDE MONONITRATE ER 60 MG PO TB24: 60.0000 mg | ORAL_TABLET | Freq: Every day | ORAL | Status: DC

## 2013-08-27 MED ORDER — ISOSORBIDE MONONITRATE ER 60 MG PO TB24
60.0000 mg | ORAL_TABLET | Freq: Every day | ORAL | Status: DC
Start: 2013-08-27 — End: 2013-09-05

## 2013-08-27 NOTE — Patient Instructions (Addendum)
Your physician recommends that you return for lab work today: BMET, Magnesium  Your physician has recommended that you have a sleep study. This test records several body functions during sleep, including: brain activity, eye movement, oxygen and carbon dioxide blood levels, heart rate and rhythm, breathing rate and rhythm, the flow of air through your mouth and nose, snoring, body muscle movements, and chest and belly movement.  Your physician has recommended you make the following change in your medication:  1) Start Isosorbide Mononitrate 60 mg daily  Your physician recommends that you schedule a follow-up appointment in: 3 months with Dr. Caryl Comes.

## 2013-08-27 NOTE — Progress Notes (Signed)
Patient Care Team: No Pcp Per Patient as PCP - General (General Practice) Deboraha Sprang, MD as PCP - Cardiology (Cardiology)   HPI  Chelsea Branch is a 48 y.o. 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 She had recovery of LV function most recently in 2008. 2011 left ventricular function had decreased to 25%.  She has significant hypertension.   She was rehospitalized   3 weeks ago for acute on chronic congestive heart failure. She is treated with IV diuresis. Echocardiogram demonstrated EF 25-30%.  She is fatigue at rest and with exertion. She has cut down on her fluid intake and does not have edema.  She has applied for disability and Medicaid.  Past Medical History  Diagnosis Date  . Chronic combined systolic and diastolic CHF (congestive heart failure)     a. 07/2013  EF of 25-30%, LV, mod LVH, diffuse hypokinesis and mild MR  . Hypertension   . MITRAL REGURGITATION 10/15/2008  . DYSLIPIDEMIA 06/08/2010  . 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 Alopecia   . Nonischemic cardiomyopathy     iniatially presumed 2/2 peripartum with improvement and then worsening - EF 25-30% by echo 07/2013.  Marland Kitchen Noncompliance   . Tobacco abuse   . Fibroids   . Abnormal uterine bleeding   . Stroke   . Menorrhagia   . Peripartum cardiomyopathy     Past Surgical History  Procedure Laterality Date  . Loop recorder implant  ~ 2000  . Cesarean section  1992  1994  . Tubal ligation  1994  . Loop recorder explant      Current Outpatient Prescriptions  Medication Sig Dispense Refill  . carvedilol (COREG) 3.125 MG tablet Take 1 tablet (3.125 mg total) by mouth 2 (two) times daily with a meal.  180 tablet  3  . ergocalciferol (VITAMIN D2) 50000 UNITS capsule Take 1 capsule (50,000 Units total) by mouth once a week.  12 capsule  0  . furosemide (LASIX) 40 MG tablet Take 40 mg by mouth daily.      . hydrALAZINE (APRESOLINE) 25 MG tablet Take 1 tablet (25  mg total) by mouth 2 (two) times daily.  180 tablet  3  . ibuprofen (ADVIL,MOTRIN) 800 MG tablet Take 800 mg by mouth every 8 (eight) hours as needed for moderate pain.      . potassium chloride SA (K-DUR,KLOR-CON) 20 MEQ tablet Take 20 mEq by mouth 2 (two) times daily.      . pravastatin (PRAVACHOL) 10 MG tablet Take 20 mg by mouth daily. 2 tabs daily for a total of 20 mg.      . spironolactone (ALDACTONE) 25 MG tablet Take 25 mg by mouth daily.       No current facility-administered medications for this visit.    Allergies  Allergen Reactions  . Ace Inhibitors     REACTION: Cough    Review of Systems negative except from HPI and PMH  Physical Exam BP 116/74  Pulse 83  Ht 5\' 8"  (1.727 m)  Wt 197 lb (89.359 kg)  BMI 29.96 kg/m2 Well developed and well nourished in no acute distress HENT normal E scleral and icterus clear Neck Supple JVP flat; carotids brisk and full Clear to ausculation  *Regular rate and rhythm, no murmurs gallops or rub Soft with active bowel sounds No clubbing cyanosis  Edema Alert and oriented, grossly normal motor and sensory function Skin Warm and Dry  ECG 24 March demonstrated  sinus rhythm at 96 intervals 16/09/35 LVH with repolarization  Assessment and  Plan  nonischemic cardiomyopathy  Congestive heart failure-chronic-systolic class III  Alopecia  She is euvolemic is still quite limited. This may be partly related to the beta blocker. She might well accommodate to this.  She also has Obstructive sleep patterns and so we'll undertake a sleep study.  alopecia could be related to beta blockers but she is wearing wigs and I think that this is a better choice in the short-term anyway.  We'll add nitroglycerin to her hydralazine and hold off on the ACE/ARB until insurance comes available.

## 2013-08-30 ENCOUNTER — Telehealth: Payer: Self-pay | Admitting: *Deleted

## 2013-08-30 NOTE — Telephone Encounter (Signed)
Pt called the nurse line requesting help with obtaining her medications since being discharged from the hospital.  Called patient, patient accidentally called the wrong clinic.

## 2013-08-31 ENCOUNTER — Telehealth: Payer: Self-pay

## 2013-08-31 ENCOUNTER — Telehealth: Payer: Self-pay | Admitting: Internal Medicine

## 2013-08-31 NOTE — Telephone Encounter (Signed)
Pt calling again to speak to nurse, please f/u with pt.

## 2013-08-31 NOTE — Telephone Encounter (Signed)
Patient called because she needs help with getting her medication Advised her to talk with tywan

## 2013-09-01 ENCOUNTER — Encounter (HOSPITAL_COMMUNITY): Payer: Self-pay | Admitting: Emergency Medicine

## 2013-09-01 ENCOUNTER — Emergency Department (HOSPITAL_COMMUNITY)
Admission: EM | Admit: 2013-09-01 | Discharge: 2013-09-01 | Disposition: A | Discharge status: Home / Self Care | Payer: Medicaid Other | Attending: Emergency Medicine | Admitting: Emergency Medicine

## 2013-09-01 DIAGNOSIS — I10 Essential (primary) hypertension: Secondary | ICD-10-CM | POA: Insufficient documentation

## 2013-09-01 DIAGNOSIS — B9689 Other specified bacterial agents as the cause of diseases classified elsewhere: Secondary | ICD-10-CM | POA: Diagnosis not present

## 2013-09-01 DIAGNOSIS — Z79899 Other long term (current) drug therapy: Secondary | ICD-10-CM | POA: Diagnosis not present

## 2013-09-01 DIAGNOSIS — E785 Hyperlipidemia, unspecified: Secondary | ICD-10-CM | POA: Insufficient documentation

## 2013-09-01 DIAGNOSIS — I5042 Chronic combined systolic (congestive) and diastolic (congestive) heart failure: Secondary | ICD-10-CM | POA: Diagnosis not present

## 2013-09-01 DIAGNOSIS — F17200 Nicotine dependence, unspecified, uncomplicated: Secondary | ICD-10-CM | POA: Diagnosis not present

## 2013-09-01 DIAGNOSIS — Z3202 Encounter for pregnancy test, result negative: Secondary | ICD-10-CM | POA: Diagnosis not present

## 2013-09-01 DIAGNOSIS — N760 Acute vaginitis: Secondary | ICD-10-CM | POA: Diagnosis not present

## 2013-09-01 DIAGNOSIS — Q054 Unspecified spina bifida with hydrocephalus: Secondary | ICD-10-CM | POA: Diagnosis not present

## 2013-09-01 DIAGNOSIS — Z9851 Tubal ligation status: Secondary | ICD-10-CM | POA: Insufficient documentation

## 2013-09-01 DIAGNOSIS — A5901 Trichomonal vulvovaginitis: Secondary | ICD-10-CM | POA: Diagnosis not present

## 2013-09-01 DIAGNOSIS — A499 Bacterial infection, unspecified: Secondary | ICD-10-CM | POA: Diagnosis not present

## 2013-09-01 DIAGNOSIS — Z8673 Personal history of transient ischemic attack (TIA), and cerebral infarction without residual deficits: Secondary | ICD-10-CM | POA: Insufficient documentation

## 2013-09-01 DIAGNOSIS — Z872 Personal history of diseases of the skin and subcutaneous tissue: Secondary | ICD-10-CM | POA: Insufficient documentation

## 2013-09-01 DIAGNOSIS — Z91199 Patient's noncompliance with other medical treatment and regimen due to unspecified reason: Secondary | ICD-10-CM | POA: Insufficient documentation

## 2013-09-01 DIAGNOSIS — A599 Trichomoniasis, unspecified: Secondary | ICD-10-CM

## 2013-09-01 DIAGNOSIS — Z9119 Patient's noncompliance with other medical treatment and regimen: Secondary | ICD-10-CM | POA: Insufficient documentation

## 2013-09-01 DIAGNOSIS — M549 Dorsalgia, unspecified: Secondary | ICD-10-CM | POA: Diagnosis present

## 2013-09-01 LAB — URINALYSIS, ROUTINE W REFLEX MICROSCOPIC
BILIRUBIN URINE: NEGATIVE
Glucose, UA: NEGATIVE mg/dL
Hgb urine dipstick: NEGATIVE
Ketones, ur: NEGATIVE mg/dL
NITRITE: NEGATIVE
PH: 5 (ref 5.0–8.0)
PROTEIN: NEGATIVE mg/dL
Specific Gravity, Urine: 1.024 (ref 1.005–1.030)
Urobilinogen, UA: 0.2 mg/dL (ref 0.0–1.0)

## 2013-09-01 LAB — HIV ANTIBODY (ROUTINE TESTING W REFLEX): HIV 1&2 Ab, 4th Generation: NONREACTIVE

## 2013-09-01 LAB — WET PREP, GENITAL: Yeast Wet Prep HPF POC: NONE SEEN

## 2013-09-01 LAB — URINE MICROSCOPIC-ADD ON

## 2013-09-01 LAB — PREGNANCY, URINE: Preg Test, Ur: NEGATIVE

## 2013-09-01 MED ORDER — ONDANSETRON 4 MG PO TBDP
4.0000 mg | ORAL_TABLET | Freq: Once | ORAL | Status: AC
Start: 2013-09-01 — End: 2013-09-01
  Administered 2013-09-01: 4 mg via ORAL
  Filled 2013-09-01: qty 1

## 2013-09-01 MED ORDER — METRONIDAZOLE 500 MG PO TABS
2000.0000 mg | ORAL_TABLET | Freq: Once | ORAL | Status: AC
  Administered 2013-09-01: 2000 mg via ORAL
  Filled 2013-09-01: qty 4

## 2013-09-01 NOTE — Discharge Instructions (Signed)
You have been treated for Trichomonas and BV here in the ED. Refer to attached documents for more information. Follow up with your OBGYN for further evaluation.

## 2013-09-01 NOTE — ED Notes (Signed)
Pt given crackers and soda to drink.  Pt alert and oriented, talking on the phone with family.
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
## 2013-09-01 NOTE — ED Notes (Signed)
Pt reports lower back pain that a couple of days ago. Pt reports that she has also been having pain with urination and vaginal discharge. States that she has also been having some vaginal itching.

## 2013-09-01 NOTE — ED Provider Notes (Signed)
CSN: [id number]     Arrival date & time 09/01/13  0845 History   First MD Initiated Contact with Patient 09/01/13 [phone]     Chief Complaint  Patient presents with  . Back Pain     (Consider location/radiation/quality/duration/timing/severity/associated sxs/prior Treatment) HPI Comments: Patient is a 48 year old female who presents with a 3 day history of dysuria. Symptoms started gradually and progressively worsened since the onset. Patient reports a burning sensation with urination. No alleviating factors. Patient reports associated vaginal discharge, vaginal itching and lower back pain. Patient denies any new sexual contacts. No other associated symptoms.    Past Medical History  Diagnosis Date  . Chronic combined systolic and diastolic CHF (congestive heart failure)     a. 07/2013  EF of 25-30%, LV, mod LVH, diffuse hypokinesis and mild MR  . Hypertension   . MITRAL REGURGITATION 10/15/2008  . DYSLIPIDEMIA 06/08/2010  . 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 Alopecia   . Nonischemic cardiomyopathy     iniatially presumed 2/2 peripartum with improvement and then worsening - EF 25-30% by echo 07/2013.  Marland Kitchen Noncompliance   . Tobacco abuse   . Fibroids   . Abnormal uterine bleeding   . Stroke   . Menorrhagia   . Peripartum cardiomyopathy    Past Surgical History  Procedure Laterality Date  . Loop recorder implant  ~ 2000  . Cesarean section  1992  1994  . Tubal ligation  1994  . Loop recorder explant     Family History  Problem Relation Age of Onset  . Other Neg Hx   . Cancer Maternal Grandmother     uterine  . Hypertension Sister    History  Substance Use Topics  . Smoking status: Current Every Day Smoker -- 0.12 packs/day for 30 years    Types: Cigarettes  . Smokeless tobacco: Never Used  . Alcohol Use: No   OB History   Grav Para Term Preterm Abortions TAB SAB Ect Mult Living   2 2 2      1 3      Review of Systems  Constitutional: Negative for fever, chills and  fatigue.  HENT: Negative for trouble swallowing.   Eyes: Negative for visual disturbance.  Respiratory: Negative for shortness of breath.   Cardiovascular: Negative for chest pain and palpitations.  Gastrointestinal: Negative for nausea, vomiting, abdominal pain and diarrhea.  Genitourinary: Positive for dysuria. Negative for difficulty urinating.  Musculoskeletal: Negative for arthralgias and neck pain.  Skin: Negative for color change.  Neurological: Negative for dizziness and weakness.  Psychiatric/Behavioral: Negative for dysphoric mood.      Allergies  Ace inhibitors  Home Medications   Current Outpatient Rx  Name  Route  Sig  Dispense  Refill  . carvedilol (COREG) 3.125 MG tablet   Oral   Take 1 tablet (3.125 mg total) by mouth 2 (two) times daily with a meal.   180 tablet   3   . ergocalciferol (VITAMIN D2) 50000 UNITS capsule   Oral   Take 1 capsule (50,000 Units total) by mouth once a week.   12 capsule   0   . furosemide (LASIX) 40 MG tablet   Oral   Take 40 mg by mouth daily.         . hydrALAZINE (APRESOLINE) 25 MG tablet   Oral   Take 1 tablet (25 mg total) by mouth 2 (two) times daily.   180 tablet   3   . ibuprofen (ADVIL,MOTRIN) 800  MG tablet   Oral   Take 800 mg by mouth every 8 (eight) hours as needed for moderate pain.         . potassium chloride SA (K-DUR,KLOR-CON) 20 MEQ tablet   Oral   Take 20 mEq by mouth 2 (two) times daily.         . pravastatin (PRAVACHOL) 10 MG tablet   Oral   Take 20 mg by mouth daily. 2 tabs daily for a total of 20 mg.         . spironolactone (ALDACTONE) 25 MG tablet   Oral   Take 25 mg by mouth daily.         . isosorbide mononitrate (IMDUR) 60 MG 24 hr tablet   Oral   Take 1 tablet (60 mg total) by mouth daily.   30 tablet   3    BP 125/93  Pulse 80  Temp(Src) 97.1 F (36.2 C) (Oral)  Resp 20  Ht 5\' 8"  (1.727 m)  Wt 197 lb (89.359 kg)  BMI 29.96 kg/m2  SpO2 100% Physical Exam   Nursing note and vitals reviewed. Constitutional: She is oriented to person, place, and time. She appears well-developed and well-nourished. No distress.  HENT:  Head: Normocephalic and atraumatic.  Eyes: Conjunctivae and EOM are normal.  Neck: Normal range of motion.  Cardiovascular: Normal rate and regular rhythm.  Exam reveals no gallop and no friction rub.   No murmur heard. Pulmonary/Chest: Effort normal and breath sounds normal. She has no wheezes. She has no rales. She exhibits no tenderness.  Abdominal: Soft. She exhibits no distension. There is no tenderness. There is no rebound and no guarding.  Genitourinary: Vagina normal.  Normal external genitalia. Copious white, thick vaginal discharge noted in vagina. No CMT. No adenxal tenderness or other focal tenderness to palpation on bimanual exam.   Musculoskeletal: Normal range of motion.  Neurological: She is alert and oriented to person, place, and time. Coordination normal.  Speech is goal-oriented. Moves limbs without ataxia.   Skin: Skin is warm and dry.  Psychiatric: She has a normal mood and affect. Her behavior is normal.    ED Course  Procedures (including critical care time) Labs Review Labs Reviewed  WET PREP, GENITAL - Abnormal; Notable for the following:    Trich, Wet Prep MANY (*)    Clue Cells Wet Prep HPF POC FEW (*)    WBC, Wet Prep HPF POC MODERATE (*)    All other components within normal limits  URINALYSIS, ROUTINE W REFLEX MICROSCOPIC - Abnormal; Notable for the following:    APPearance CLOUDY (*)    Leukocytes, UA MODERATE (*)    All other components within normal limits  GC/CHLAMYDIA PROBE AMP  PREGNANCY, URINE  HIV ANTIBODY (ROUTINE TESTING)  URINE MICROSCOPIC-ADD ON   Imaging Review No results found.   EKG Interpretation None      MDM   Final diagnoses:  Trichomonas infection  BV (bacterial vaginosis)    9:45 AM Urinalysis and wet prep pending. Vitals stable and patient afebrile.    11:10 AM Patient's wet prep shows trichomonas and BV. Patient will be treated with 2g PO flagyl once here in the ED. Patient will be contacted for GC/Chlamydia results. Vitals stable and patient afebrile. Patient advised to follow up with PCP.    Alvina Chou, PA-C 09/02/13 1417

## 2013-09-01 NOTE — ED Notes (Signed)
PA at bedside with pelvic cart.

## 2013-09-03 ENCOUNTER — Other Ambulatory Visit: Payer: Self-pay | Admitting: *Deleted

## 2013-09-03 ENCOUNTER — Telehealth: Payer: Self-pay | Admitting: Internal Medicine

## 2013-09-03 ENCOUNTER — Other Ambulatory Visit: Payer: Self-pay | Admitting: Internal Medicine

## 2013-09-03 LAB — GC/CHLAMYDIA PROBE AMP
CT PROBE, AMP APTIMA: NEGATIVE
GC Probe RNA: NEGATIVE

## 2013-09-03 MED ORDER — CARVEDILOL 3.125 MG PO TABS: 3.1250 mg | ORAL_TABLET | Freq: Two times a day (BID) | ORAL | Status: DC

## 2013-09-03 MED ORDER — FUROSEMIDE 40 MG PO TABS: 40.0000 mg | ORAL_TABLET | Freq: Every day | ORAL | Status: DC

## 2013-09-03 MED ORDER — LOSARTAN POTASSIUM 25 MG PO TABS: 25.0000 mg | ORAL_TABLET | Freq: Two times a day (BID) | ORAL | Status: DC

## 2013-09-03 MED ORDER — PRAVASTATIN SODIUM 10 MG PO TABS
20.0000 mg | ORAL_TABLET | Freq: Every day | ORAL | Status: DC
Start: 2013-09-03 — End: 2013-09-05

## 2013-09-03 MED ORDER — HYDRALAZINE HCL 25 MG PO TABS: 25.0000 mg | ORAL_TABLET | Freq: Two times a day (BID) | ORAL | Status: DC

## 2013-09-03 NOTE — Telephone Encounter (Signed)
Discussed with patient discontinuing Carvedilol, starting Losartan 25 mg BID. Rx sent to [facility].  I explained that we would send last OV to [facility] tomorrow for medication clarification. Pt agreeable to plan.

## 2013-09-03 NOTE — Telephone Encounter (Signed)
New problem   Pt was at [facility] trying to get her medication refill and they told her some medication they are showing that she isnt taking and she actually is taking. Need to speak to nurse so she can get her medication refill.

## 2013-09-03 NOTE — Addendum Note (Signed)
Addended by: Allyson Sabal MD, Ascencion Dike on: 09/03/2013 12:30 PM   Modules accepted: Orders

## 2013-09-03 NOTE — Telephone Encounter (Signed)
New Message  Pt called, states that the carvedilol is causing her hair to fall out. Requests a call back to discuss medication alternative. Please assist

## 2013-09-04 ENCOUNTER — Other Ambulatory Visit: Payer: Self-pay | Admitting: *Deleted

## 2013-09-04 NOTE — Telephone Encounter (Signed)
New Message  Pt called. She states Losartan is not on the $4 list of medication.. Requesting a call back to discuss an alternative. Please call

## 2013-09-05 ENCOUNTER — Other Ambulatory Visit: Payer: Self-pay | Admitting: *Deleted

## 2013-09-05 ENCOUNTER — Telehealth: Payer: Self-pay

## 2013-09-05 MED ORDER — POTASSIUM CHLORIDE CRYS ER 20 MEQ PO TBCR: 20.0000 meq | EXTENDED_RELEASE_TABLET | Freq: Two times a day (BID) | ORAL | Status: DC

## 2013-09-05 MED ORDER — PRAVASTATIN SODIUM 20 MG PO TABS: 20.0000 mg | ORAL_TABLET | Freq: Every day | ORAL | Status: DC

## 2013-09-05 MED ORDER — SPIRONOLACTONE 25 MG PO TABS: 25.0000 mg | ORAL_TABLET | Freq: Every day | ORAL | Status: DC

## 2013-09-05 NOTE — Telephone Encounter (Signed)
Left message to return my call.  

## 2013-09-05 NOTE — Telephone Encounter (Signed)
Clarified medications w/ pharmacy. Pt statin changed to Simvastatin 10 mg daily. Prescriptions for potassium and losartan also sent to pharmacy.

## 2013-09-05 NOTE — Telephone Encounter (Addendum)
Updated patient that I was waiting for Chelsea Branch to call me back to discuss medications. I will also discuss Losartan with them also. Pt agreeable to plan.  Pt also explained that she had a terrible headache after starting Imdur, and this has occurred before while taking this medication. She has stopped taking it, and will not restart it per her statement. I will review with Dr. Caryl Comes.

## 2013-09-06 ENCOUNTER — Other Ambulatory Visit: Payer: Self-pay | Admitting: *Deleted

## 2013-09-06 MED ORDER — LOSARTAN POTASSIUM 25 MG PO TABS: 25.0000 mg | ORAL_TABLET | Freq: Two times a day (BID) | ORAL | Status: DC

## 2013-09-06 NOTE — Telephone Encounter (Signed)
Informed pt that I spoke with [facility] and sent rx to their office, including Losartan rx. Advised her to call them about picking these medications up, and stopping carvedilol once starting losartan. Patient verbalized understanding and agreeable to plan.

## 2013-09-11 NOTE — ED Provider Notes (Signed)
Medical screening examination/treatment/procedure(s) were performed by non-physician practitioner and as supervising physician I was immediately available for consultation/collaboration.   EKG Interpretation None      Rolland Porter, MD, Abram Sander   Janice Norrie, MD 09/11/13 [phone]

## 2013-09-18 ENCOUNTER — Ambulatory Visit: Payer: Self-pay

## 2013-09-20 ENCOUNTER — Encounter (HOSPITAL_COMMUNITY): Payer: Self-pay | Admitting: Emergency Medicine

## 2013-09-20 ENCOUNTER — Emergency Department (HOSPITAL_COMMUNITY): Payer: Medicaid Other

## 2013-09-20 ENCOUNTER — Emergency Department (HOSPITAL_COMMUNITY)
Admission: EM | Admit: 2013-09-20 | Discharge: 2013-09-20 | Disposition: A | Discharge status: Home / Self Care | Payer: Medicaid Other | Attending: Emergency Medicine | Admitting: Emergency Medicine

## 2013-09-20 DIAGNOSIS — Z872 Personal history of diseases of the skin and subcutaneous tissue: Secondary | ICD-10-CM | POA: Diagnosis not present

## 2013-09-20 DIAGNOSIS — I5042 Chronic combined systolic (congestive) and diastolic (congestive) heart failure: Secondary | ICD-10-CM | POA: Diagnosis not present

## 2013-09-20 DIAGNOSIS — E785 Hyperlipidemia, unspecified: Secondary | ICD-10-CM | POA: Insufficient documentation

## 2013-09-20 DIAGNOSIS — Z8673 Personal history of transient ischemic attack (TIA), and cerebral infarction without residual deficits: Secondary | ICD-10-CM | POA: Diagnosis not present

## 2013-09-20 DIAGNOSIS — Z87728 Personal history of other specified (corrected) congenital malformations of nervous system and sense organs: Secondary | ICD-10-CM | POA: Insufficient documentation

## 2013-09-20 DIAGNOSIS — I10 Essential (primary) hypertension: Secondary | ICD-10-CM | POA: Diagnosis not present

## 2013-09-20 DIAGNOSIS — Z91199 Patient's noncompliance with other medical treatment and regimen due to unspecified reason: Secondary | ICD-10-CM | POA: Insufficient documentation

## 2013-09-20 DIAGNOSIS — B9689 Other specified bacterial agents as the cause of diseases classified elsewhere: Secondary | ICD-10-CM | POA: Insufficient documentation

## 2013-09-20 DIAGNOSIS — Z3202 Encounter for pregnancy test, result negative: Secondary | ICD-10-CM | POA: Diagnosis not present

## 2013-09-20 DIAGNOSIS — F17200 Nicotine dependence, unspecified, uncomplicated: Secondary | ICD-10-CM | POA: Diagnosis not present

## 2013-09-20 DIAGNOSIS — A499 Bacterial infection, unspecified: Secondary | ICD-10-CM | POA: Insufficient documentation

## 2013-09-20 DIAGNOSIS — N760 Acute vaginitis: Secondary | ICD-10-CM | POA: Diagnosis not present

## 2013-09-20 DIAGNOSIS — Z79899 Other long term (current) drug therapy: Secondary | ICD-10-CM | POA: Diagnosis not present

## 2013-09-20 DIAGNOSIS — Z9119 Patient's noncompliance with other medical treatment and regimen: Secondary | ICD-10-CM | POA: Insufficient documentation

## 2013-09-20 DIAGNOSIS — R109 Unspecified abdominal pain: Secondary | ICD-10-CM | POA: Diagnosis present

## 2013-09-20 LAB — WET PREP, GENITAL
CLUE CELLS WET PREP: NONE SEEN
TRICH WET PREP: NONE SEEN
YEAST WET PREP: NONE SEEN

## 2013-09-20 LAB — LIPASE, BLOOD: Lipase: 28 U/L (ref 11–59)

## 2013-09-20 LAB — COMPREHENSIVE METABOLIC PANEL
ALBUMIN: 3.1 g/dL — AB (ref 3.5–5.2)
ALT: 16 U/L (ref 0–35)
AST: 16 U/L (ref 0–37)
Alkaline Phosphatase: 107 U/L (ref 39–117)
BUN: 8 mg/dL (ref 6–23)
CALCIUM: 9.3 mg/dL (ref 8.4–10.5)
CO2: 25 mEq/L (ref 19–32)
Chloride: 102 mEq/L (ref 96–112)
Creatinine, Ser: 0.69 mg/dL (ref 0.50–1.10)
GFR calc Af Amer: >90 mL/min (ref >90)
GFR calc non Af Amer: >90 mL/min (ref >90)
GLUCOSE: 82 mg/dL (ref 70–99)
Potassium: 4.2 mEq/L (ref 3.7–5.3)
SODIUM: 140 meq/L (ref 137–147)
TOTAL PROTEIN: 7.1 g/dL (ref 6.0–8.3)
Total Bilirubin: 0.4 mg/dL (ref 0.3–1.2)

## 2013-09-20 LAB — URINALYSIS, ROUTINE W REFLEX MICROSCOPIC
Bilirubin Urine: NEGATIVE
Glucose, UA: NEGATIVE mg/dL
Hgb urine dipstick: NEGATIVE
Ketones, ur: NEGATIVE mg/dL
LEUKOCYTES UA: NEGATIVE
NITRITE: NEGATIVE
PH: 7 (ref 5.0–8.0)
Protein, ur: NEGATIVE mg/dL
SPECIFIC GRAVITY, URINE: 1.005 (ref 1.005–1.030)
Urobilinogen, UA: 0.2 mg/dL (ref 0.0–1.0)

## 2013-09-20 LAB — CBC WITH DIFFERENTIAL/PLATELET
BASOS ABS: 0 10*3/uL (ref 0.0–0.1)
BASOS PCT: 0 % (ref 0–1)
EOS ABS: 0.1 10*3/uL (ref 0.0–0.7)
Eosinophils Relative: 2 % (ref 0–5)
HCT: 41 % (ref 36.0–46.0)
HEMOGLOBIN: 12.8 g/dL (ref 12.0–15.0)
LYMPHS PCT: 37 % (ref 12–46)
Lymphs Abs: 2.2 10*3/uL (ref 0.7–4.0)
MCH: 24.2 pg — AB (ref 26.0–34.0)
MCHC: 31.2 g/dL (ref 30.0–36.0)
MCV: 77.5 fL — ABNORMAL LOW (ref 78.0–100.0)
MONO ABS: 0.4 10*3/uL (ref 0.1–1.0)
Monocytes Relative: 7 % (ref 3–12)
NEUTROS PCT: 54 % (ref 43–77)
Neutro Abs: 3.2 10*3/uL (ref 1.7–7.7)
Platelets: 243 10*3/uL (ref 150–400)
RBC: 5.29 MIL/uL — ABNORMAL HIGH (ref 3.87–5.11)
RDW: 22.2 % — AB (ref 11.5–15.5)
Smear Review: ADEQUATE
WBC: 5.9 10*3/uL (ref 4.0–10.5)

## 2013-09-20 LAB — PREGNANCY, URINE: Preg Test, Ur: NEGATIVE

## 2013-09-20 MED ORDER — DOXYCYCLINE HYCLATE 100 MG PO CAPS: 100.0000 mg | ORAL_CAPSULE | Freq: Two times a day (BID) | ORAL | Status: DC

## 2013-09-20 MED ORDER — ONDANSETRON 4 MG PO TBDP
4.0000 mg | ORAL_TABLET | Freq: Once | ORAL | Status: AC
  Administered 2013-09-20: 4 mg via ORAL
  Filled 2013-09-20: qty 1

## 2013-09-20 MED ORDER — METRONIDAZOLE 500 MG PO TABS: 500.0000 mg | ORAL_TABLET | Freq: Two times a day (BID) | ORAL | Status: DC

## 2013-09-20 MED ORDER — METRONIDAZOLE 500 MG PO TABS
2000.0000 mg | ORAL_TABLET | Freq: Once | ORAL | Status: AC
  Administered 2013-09-20: 2000 mg via ORAL
  Filled 2013-09-20: qty 4

## 2013-09-20 NOTE — ED Notes (Signed)
48 yo female presents with c/o abd pain, uterus pain and painful urination. States persistent pain for 4 days. Reports heaviness and cramping and small amount of yellow discharge with fishy odor. Reports that she has Mirena IUD.

## 2013-09-20 NOTE — Discharge Instructions (Signed)
Please call your doctor for a followup appointment within 24-48 hours. When you talk to your doctor please let them know that you were seen in the emergency department and have them acquire all of your records so that they can discuss the findings with you and formulate a treatment plan to fully care for your new and ongoing problems. Please call and set-up an appointment with OBGYN/ [facility]'s Outpatient Clinic to be seen and re-assessed within the next 4-48 hours Please take medications as prescribed - please take on a full stomach  Please rest and stay hydrated Please avoid any sexual activity until infection has been cleared Please continue to monitor symptoms and if symptoms are to worsen or change (fever greater than 101, chills, sweating, chest pain, shortness of breath, difficulty breathing, nausea, vomiting, diarrhea, worsening or changes to abdominal pain, back pain, neck stiffness, neck pain, weakness, numbness, abnormal vaginal bleeding, abnormal vaginal pain) please report back to the ED immediately    Bacterial Vaginosis Bacterial vaginosis is a vaginal infection that occurs when the normal balance of bacteria in the vagina is disrupted. It results from an overgrowth of certain bacteria. This is the most common vaginal infection in women of childbearing age. Treatment is important to prevent complications, especially in pregnant women, as it can cause a premature delivery. CAUSES  Bacterial vaginosis is caused by an increase in harmful bacteria that are normally present in smaller amounts in the vagina. Several different kinds of bacteria can cause bacterial vaginosis. However, the reason that the condition develops is not fully understood. RISK FACTORS Certain activities or behaviors can put you at an increased risk of developing bacterial vaginosis, including:  Having a new sex partner or multiple sex partners.  Douching.  Using an intrauterine device (IUD) for contraception. Women  do not get bacterial vaginosis from toilet seats, bedding, swimming pools, or contact with objects around them. SIGNS AND SYMPTOMS  Some women with bacterial vaginosis have no signs or symptoms. Common symptoms include:  Grey vaginal discharge.  A fishlike odor with discharge, especially after sexual intercourse.  Itching or burning of the vagina and vulva.  Burning or pain with urination. DIAGNOSIS  Your health care provider will take a medical history and examine the vagina for signs of bacterial vaginosis. A sample of vaginal fluid may be taken. Your health care provider will look at this sample under a microscope to check for bacteria and abnormal cells. A vaginal pH test may also be done.  TREATMENT  Bacterial vaginosis may be treated with antibiotic medicines. These may be given in the form of a pill or a vaginal cream. A second round of antibiotics may be prescribed if the condition comes back after treatment.  HOME CARE INSTRUCTIONS   Only take over-the-counter or prescription medicines as directed by your health care provider.  If antibiotic medicine was prescribed, take it as directed. Make sure you finish it even if you start to feel better.  Do not have sex until treatment is completed.  Tell all sexual partners that you have a vaginal infection. They should see their health care provider and be treated if they have problems, such as a mild rash or itching.  Practice safe sex by using condoms and only having one sex partner. SEEK MEDICAL CARE IF:   Your symptoms are not improving after 3 days of treatment.  You have increased discharge or pain.  You have a fever. MAKE SURE YOU:   Understand these instructions.  Will watch  your condition.  Will get help right away if you are not doing well or get worse. FOR MORE INFORMATION  Centers for Disease Control and Prevention, Division of STD Prevention: AppraiserFraud.fi American Sexual Health Association (ASHA):  www.ashastd.org  Document Released: 05/10/2005 Document Revised: 02/28/2013 Document Reviewed: 12/20/2012 [facility] Patient Information 2014 [place].   Emergency Department Resource Guide 1) Find a Doctor and Pay Out of Pocket Although you won't have to find out who is covered by your insurance plan, it is a good idea to ask around and get recommendations. You will then need to call the office and see if the doctor you have chosen will accept you as a new patient and what types of options they offer for patients who are self-pay. Some doctors offer discounts or will set up payment plans for their patients who do not have insurance, but you will need to ask so you aren't surprised when you get to your appointment.  2) Contact Your Local Health Department Not all health departments have doctors that can see patients for sick visits, but many do, so it is worth a call to see if yours does. If you don't know where your local health department is, you can check in your phone book. The CDC also has a tool to help you locate your state's health department, and many state websites also have listings of all of their local health departments.  3) Find a [facility] If your illness is not likely to be very severe or complicated, you may want to try a walk in clinic. These are popping up all over the country in pharmacies, drugstores, and shopping centers. They're usually staffed by nurse practitioners or physician assistants that have been trained to treat common illnesses and complaints. They're usually fairly quick and inexpensive. However, if you have serious medical issues or chronic medical problems, these are probably not your best option.  No Primary Care Doctor: - Call Health Connect at  [phone] - they can help you locate a primary care doctor that  accepts your insurance, provides certain services, etc. - Physician Referral Service- [phone]  Chronic Pain Problems: Organization          Address  Phone   Notes  [facility]  [phone] Patients need to be referred by their primary care doctor.   Medication Assistance: Organization         Address  Phone   Notes  [facility] Medication [facility] [address] [place], [place] [address] [phone] --Must be a resident of [facility] -- Must have NO insurance coverage whatsoever (no Medicaid/ Medicare, etc.) -- The pt. MUST have a primary care doctor that directs their care regularly and follows them in the community   MedAssist  [phone]   [employer]  5161386225    Agencies that provide inexpensive medical care: Organization         Address  Phone   Notes  [place]  [phone]   [facility]    [phone]   [facility] [place], [place] [address] [phone]   [place] [address] [place] [phone]   Planned Parenthood    [phone]   [facility]    [phone]   [place] and [place], [place] Phone:  [phone], Fax:  [phone]  Hours of Operation:  9 am - 6 pm, M-F.  Also accepts Medicaid/Medicare and self-pay.  [facility] for [address], [address], [place] Phone: [phone], Fax: [phone]. Hours of Operation:  8:30 am - 5:30 pm, M-F.  Also accepts Medicaid and self-pay.  [facility] [place] [address] [place] Phone: [phone]   [place], [place], [place] [phone], Ext. [phone] Mondays & Thursdays: 7-9 AM.  First 15 patients are seen on a first come, first serve basis.    [place] Providers:  Organization         Address  Phone   Notes  [facility] [address], [place] [phone] Also accepts self-pay patients.    [facility] [id number] [place], [place]  [phone]   [place], [address], [place] [phone]   [facility] [address] [place] [phone]   Lucianne Lei [address], [place]   [phone] Only accepts [place] Access [place] patients after they have their name applied to their card.   Self-Pay (no insurance) in [facility]:  Organization         Address  Phone   Notes  Sickle Cell Patients, [facility] [place] [phone]   [facility] Urgent Care [place] [phone]   [facility] [place]  [place], [place], [place] [phone]   [facility]/Dr. Osei-Bonsu  [address], [place] or [address], [place] [phone] Phone number for both [place] and [place] locations is the same.  Urgent Medical and [facility] [address] [place] [phone]   [facility] [address], [place] or [address] [phone]   [facility] [address], [place] [phone], phone; [phone], fax Sees patients 1st and 3rd Saturday of every month.  Must not qualify for public or private insurance (i.e. Medicaid, Medicare, [place] Health Choice, Veterans' Benefits)  Household income should be no more than 200% of the poverty level The clinic cannot treat you if you are pregnant or think you are pregnant  Sexually transmitted diseases are not treated at the clinic.    Dental Care: Organization         Address  Phone  Notes  [facility] [place] [phone] Accepts children up to age 38 who are enrolled in [place] or [place]; pregnant women with a Medicaid  card; and children who have applied for Medicaid or [place] Health Choice, but were declined, whose parents can pay a reduced fee at time of service.  [facility] Department of [facility]  [address], [place] [phone] Accepts children up to age 61 who are enrolled in [place] or [place]; pregnant women with a Medicaid card; and children who have applied for Medicaid or [place] Health Choice, but were declined, whose parents can pay a reduced fee at time of service.  [place] Adult Dental Access PROGRAM  [place] [phone] Patients are seen by appointment only. Walk-ins are not accepted. [place]  will see patients 46 years of age and older. Monday - Tuesday (8am-5pm) Most Wednesdays (8:30-5pm) $30 per visit, cash only  [facility] Adult Dental Access PROGRAM  [address], [facility] [phone] Patients are seen by appointment only. Walk-ins are not accepted. [place] will see patients 106 years of age and older. One Wednesday Evening (Monthly: Volunteer Based).  $30 per visit, cash only  [place]  [phone] for adults; Children under age 57, call Graduate Pediatric Dentistry at [phone]. Children aged 59-14, please call [phone] to request a pediatric application.  Dental services are provided in all areas of dental care including fillings, crowns and bridges, complete and partial dentures, implants, gum treatment, root canals, and extractions. Preventive care is also provided. Treatment is provided to both adults and children. Patients are selected via a lottery and there is often a waiting list.   [facility] [address], [place]  [phone] [email or web address]   [facility] [address] [place], [place] [phone], Ext. [phone] Second and Fourth Thursday of each month, opens at 6:30 AM; Clinic ends at 9 AM.  Patients are seen on a first-come  first-served basis, and a limited number are seen during each clinic.   [facility]  [address] Hillard Danker [place], [place] [phone]   Eligibility Requirements You must have lived in [place], [place], or [place] counties for at least the last three months.   You cannot be eligible for state or federal sponsored [employer], including [employer], [place], or [employer].   You generally cannot be eligible for healthcare insurance through your employer.    How to apply: Eligibility screenings are held every Tuesday and Wednesday afternoon from 1:00 pm until 4:00 pm. You do not need an appointment for the interview!  [facility] [address] [place], [place]   [place]  [place] Department  [place]  [phone]    Behavioral Health Resources in the Community: Intensive Outpatient Programs Organization         Address  Phone  Notes  [place]. [address] [place], [place] [phone]   [facility] Outpatient [address], [place], [place]   ADS: Alcohol & Drug Svcs [address] [place], [place]   [place] [address],  [place], [place] or [phone]   Substance Abuse Resources Organization         Address  Phone  Notes  Alcohol and Drug Services  [phone]   [place]  [phone]   [facility] [place]   Chinita Pester  [phone]   Residential & Outpatient Substance Abuse Program  [phone]   Psychological Services Organization         Address  Phone  Notes  [facility] [place]  [place]  [phone]   [place] [address], [place] or [phone]    Mobile Crisis Teams Organization          Address  Phone  Notes  [facility]rapeutic Alternatives, Mobile Crisis Care Unit  [phone]   Assertive Psychotherapeutic Services  [address] [place], [place]   [facility] [address] [place] [phone]    Self-Help/Support Groups Organization  Address  Phone             Notes  [place]. of [place] - variety of support groups  [place] Call for more information  Narcotics Anonymous (NA), [facility] [address], [employer] [place]  2 meetings at this location   Special educational needs teacher         Address  Phone  Notes  ASAP Residential Treatment [place],    [place]  [phone])[phone]   [facility]  [address] [place] [id number], [place], [place]   [place], [place] [phone] Admissions: 8am-3pm M-F  Incentives Substance [place] [address]    [place], [place] [phone]   [facility] [address] [place], [place], [place]   [facility] [address]  [place], [place]   Insight Programs - Intensive Outpatient [address] Kristeen [address], [place], [place]   [facility]  [place], [place] [phone] or [phone]   Residential Treatment Services (RTS) [address], [place], [place] Accepts Medicaid  [facility] [place] [address]  [place] [phone] Substance Abuse/Addiction Treatment   [facility] Organization         Address  Phone  Notes  [facility]  [phone]   Domenic Schwab, PhD [address] Arlis [place], [place]   [phone] or [phone]   [place] [facility] [place], [place] [phone]   [facility] [address], [place], [place] [phone] Insurance/Medicaid/sponsorship  through [facility] and Families [address], Ste [place]                                    [place], [place] [phone] [place] [address], [place] [phone]    Dr. Adele Schilder  [phone]   [facility] Dept. 1) [address] [place] 2) [place] 3)  [place] [address], [place] [phone]  [phone]   [place] [phone] or [phone] (After Hours)

## 2013-09-20 NOTE — ED Provider Notes (Signed)
CSN: [id number]     Arrival date & time 09/20/13  [id number] History   First MD Initiated Contact with Patient 09/20/13 [phone]     Chief Complaint  Patient presents with  . Abdominal Pain     (Consider location/radiation/quality/duration/timing/severity/associated sxs/prior Treatment) The history is provided by the patient. No language interpreter was used.  Chelsea Branch is a 48 year old female with past medical history of hypertension, mitral regurg, dyslipidemia, alopecia, nonischemic cardiomyopathy, fibroids, stroke presenting to the ED with abdominal pain localize the lower abdomen that started approximately 4 days ago. Patient reports that the abdominal pain as a cramping sensation. Stated that she's been experiencing vaginal pruritis along with a yellowish discharge for the past 4 days. Stated that she's been having discomfort with urination-dysuria. Reported that she did have Mirena placed on October 2014 secondary to her uterine fibroids. Reported that she normally follows up with women's. Reported that she is sexually active without protection - stated that she has had only one partner in the past 6 months. Denied nausea, vomiting, diarrhea, melena, hematochezia, hematuria, fever, chills, chest pain, shortness of breath, difficulty breathing, numbness, tingling. PCP none  Past Medical History  Diagnosis Date  . Chronic combined systolic and diastolic CHF (congestive heart failure)     a. 07/2013  EF of 25-30%, LV, mod LVH, diffuse hypokinesis and mild MR  . Hypertension   . MITRAL REGURGITATION 10/15/2008  . DYSLIPIDEMIA 06/08/2010  . 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 Alopecia   . Nonischemic cardiomyopathy     iniatially presumed 2/2 peripartum with improvement and then worsening - EF 25-30% by echo 07/2013.  Marland Kitchen Noncompliance   . Tobacco abuse   . Fibroids   . Abnormal uterine bleeding   . Stroke   . Menorrhagia   . Peripartum cardiomyopathy    Past Surgical History  Procedure  Laterality Date  . Loop recorder implant  ~ 2000  . Cesarean section  1992  1994  . Tubal ligation  1994  . Loop recorder explant     Family History  Problem Relation Age of Onset  . Other Neg Hx   . Cancer Maternal Grandmother     uterine  . Hypertension Sister    History  Substance Use Topics  . Smoking status: Current Every Day Smoker -- 0.12 packs/day for 30 years    Types: Cigarettes  . Smokeless tobacco: Never Used  . Alcohol Use: No   OB History   Grav Para Term Preterm Abortions TAB SAB Ect Mult Living   2 2 2      1 3      Review of Systems  Constitutional: Negative for fever and chills.  Respiratory: Negative for chest tightness and shortness of breath.   Cardiovascular: Negative for chest pain.  Gastrointestinal: Positive for abdominal pain. Negative for nausea, vomiting, diarrhea, constipation, blood in stool and anal bleeding.  Genitourinary: Positive for dysuria, vaginal discharge and pelvic pain. Negative for flank pain, decreased urine volume, vaginal bleeding and vaginal pain.  Musculoskeletal: Negative for back pain and neck pain.  Neurological: Negative for weakness and headaches.  All other systems reviewed and are negative.     Allergies  Ace inhibitors  Home Medications   Prior to Admission medications   Medication Sig Start Date End Date Taking? Authorizing Provider  ergocalciferol (VITAMIN D2) 50000 UNITS capsule Take 1 capsule (50,000 Units total) by mouth once a week. 08/15/13   Reyne Dumas, MD  furosemide (LASIX) 40 MG tablet Take 1 tablet (  40 mg total) by mouth daily. 09/03/13   Reyne Dumas, MD  hydrALAZINE (APRESOLINE) 25 MG tablet Take 1 tablet (25 mg total) by mouth 2 (two) times daily. 09/03/13 09/03/14  Reyne Dumas, MD  ibuprofen (ADVIL,MOTRIN) 800 MG tablet Take 800 mg by mouth every 8 (eight) hours as needed for moderate pain.    Historical Provider, MD  losartan (COZAAR) 25 MG tablet Take 1 tablet (25 mg total) by mouth 2 (two) times  daily. 09/06/13   Deboraha Sprang, MD  potassium chloride SA (K-DUR,KLOR-CON) 20 MEQ tablet Take 1 tablet (20 mEq total) by mouth 2 (two) times daily. 09/05/13   Deboraha Sprang, MD  pravastatin (PRAVACHOL) 20 MG tablet Take 1 tablet (20 mg total) by mouth daily. 2 tabs daily for a total of 20 mg. 09/05/13   Deboraha Sprang, MD  spironolactone (ALDACTONE) 25 MG tablet Take 1 tablet (25 mg total) by mouth daily. 09/05/13   Deboraha Sprang, MD   BP 116/67  Pulse 94  Temp(Src) 97.9 F (36.6 C) (Oral)  Resp 17  Wt 193 lb (87.544 kg)  SpO2 99% Physical Exam  Nursing note and vitals reviewed. Constitutional: She is oriented to person, place, and time. She appears well-developed and well-nourished. No distress.  HENT:  Head: Normocephalic and atraumatic.  Mouth/Throat: Oropharynx is clear and moist. No oropharyngeal exudate.  Eyes: Conjunctivae and EOM are normal. Pupils are equal, round, and reactive to light. Right eye exhibits no discharge. Left eye exhibits no discharge.  Neck: Normal range of motion. Neck supple. No tracheal deviation present.  Cardiovascular: Normal rate, regular rhythm and normal heart sounds.  Exam reveals no friction rub.   No murmur heard. Pulses:      Radial pulses are 2+ on the right side, and 2+ on the left side.       Dorsalis pedis pulses are 2+ on the right side, and 2+ on the left side.  Pulmonary/Chest: Effort normal and breath sounds normal. No respiratory distress. She has no wheezes. She has no rales.  Abdominal: Soft. Bowel sounds are normal. She exhibits no distension. There is tenderness in the right lower quadrant, suprapubic area and left lower quadrant. There is no rebound and no guarding.    Obese  Soft upon palpation Discomfort upon palpation to suprapubic and bilateral lower quadrants Negative peritoneal signs Nonsurgical abdomen noted  Genitourinary: Vaginal discharge found.  Pelvic exam: Negative swelling, erythema, lesions, sores, abnormalities or  masses noted external genitalia. Negative swelling, erythema, lesions, sores, masses identified to the vaginal canal. Negative blood in the vaginal vault. Thick white discharge identified to the vaginal vault and coming from the cervix. Mirena identified. Negative swelling, erythema, inflammation, lesions, sores, strawberry appearance identified to the cervix. Negative CMT. Negative bilateral adnexal tenderness. Exam chaperoned with nurse  Musculoskeletal: Normal range of motion.  Full ROM to upper and lower extremities without difficulty noted, negative ataxia noted.  Lymphadenopathy:    She has no cervical adenopathy.  Neurological: She is alert and oriented to person, place, and time. No cranial nerve deficit. She exhibits normal muscle tone. Coordination normal.  Skin: Skin is warm and dry. No rash noted. She is not diaphoretic. No erythema.  Psychiatric: She has a normal mood and affect. Her behavior is normal. Thought content normal.    ED Course  Procedures (including critical care time)  Results for orders placed during the hospital encounter of 09/20/13  WET PREP, GENITAL      Result Value  Ref Range   Yeast Wet Prep HPF POC NONE SEEN  NONE SEEN   Trich, Wet Prep NONE SEEN  NONE SEEN   Clue Cells Wet Prep HPF POC NONE SEEN  NONE SEEN   WBC, Wet Prep HPF POC MANY (*) NONE SEEN  CBC WITH DIFFERENTIAL      Result Value Ref Range   WBC 5.9  4.0 - 10.5 K/uL   RBC 5.29 (*) 3.87 - 5.11 MIL/uL   Hemoglobin 12.8  12.0 - 15.0 g/dL   HCT 41.0  36.0 - 46.0 %   MCV 77.5 (*) 78.0 - 100.0 fL   MCH 24.2 (*) 26.0 - 34.0 pg   MCHC 31.2  30.0 - 36.0 g/dL   RDW 22.2 (*) 11.5 - 15.5 %   Platelets 243  150 - 400 K/uL   Neutrophils Relative % 54  43 - 77 %   Lymphocytes Relative 37  12 - 46 %   Monocytes Relative 7  3 - 12 %   Eosinophils Relative 2  0 - 5 %   Basophils Relative 0  0 - 1 %   Neutro Abs 3.2  1.7 - 7.7 K/uL   Lymphs Abs 2.2  0.7 - 4.0 K/uL   Monocytes Absolute 0.4  0.1 - 1.0  K/uL   Eosinophils Absolute 0.1  0.0 - 0.7 K/uL   Basophils Absolute 0.0  0.0 - 0.1 K/uL   Smear Review       Value: PLATELET CLUMPS NOTED ON SMEAR, COUNT APPEARS ADEQUATE  COMPREHENSIVE METABOLIC PANEL      Result Value Ref Range   Sodium 140  137 - 147 mEq/L   Potassium 4.2  3.7 - 5.3 mEq/L   Chloride 102  96 - 112 mEq/L   CO2 25  19 - 32 mEq/L   Glucose, Bld 82  70 - 99 mg/dL   BUN 8  6 - 23 mg/dL   Creatinine, Ser 0.69  0.50 - 1.10 mg/dL   Calcium 9.3  8.4 - 10.5 mg/dL   Total Protein 7.1  6.0 - 8.3 g/dL   Albumin 3.1 (*) 3.5 - 5.2 g/dL   AST 16  0 - 37 U/L   ALT 16  0 - 35 U/L   Alkaline Phosphatase 107  39 - 117 U/L   Total Bilirubin 0.4  0.3 - 1.2 mg/dL   GFR calc non Af Amer >90  >90 mL/min   GFR calc Af Amer >90  >90 mL/min  LIPASE, BLOOD      Result Value Ref Range   Lipase 28  11 - 59 U/L  URINALYSIS, ROUTINE W REFLEX MICROSCOPIC      Result Value Ref Range   Color, Urine YELLOW  YELLOW   APPearance CLOUDY (*) CLEAR   Specific Gravity, Urine 1.005  1.005 - 1.030   pH 7.0  5.0 - 8.0   Glucose, UA NEGATIVE  NEGATIVE mg/dL   Hgb urine dipstick NEGATIVE  NEGATIVE   Bilirubin Urine NEGATIVE  NEGATIVE   Ketones, ur NEGATIVE  NEGATIVE mg/dL   Protein, ur NEGATIVE  NEGATIVE mg/dL   Urobilinogen, UA 0.2  0.0 - 1.0 mg/dL   Nitrite NEGATIVE  NEGATIVE   Leukocytes, UA NEGATIVE  NEGATIVE  PREGNANCY, URINE      Result Value Ref Range   Preg Test, Ur NEGATIVE  NEGATIVE   Labs Review Labs Reviewed  WET PREP, GENITAL - Abnormal; Notable for the following:    WBC, Wet Prep HPF  POC MANY (*)    All other components within normal limits  CBC WITH DIFFERENTIAL - Abnormal; Notable for the following:    RBC 5.29 (*)    MCV 77.5 (*)    MCH 24.2 (*)    RDW 22.2 (*)    All other components within normal limits  COMPREHENSIVE METABOLIC PANEL - Abnormal; Notable for the following:    Albumin 3.1 (*)    All other components within normal limits  URINALYSIS, ROUTINE W REFLEX  MICROSCOPIC - Abnormal; Notable for the following:    APPearance CLOUDY (*)    All other components within normal limits  GC/CHLAMYDIA PROBE AMP  LIPASE, BLOOD  PREGNANCY, URINE    Imaging Review US Abdomen Complete  09/20/2013   CLINICAL DATA:  Right upper quadrant pain.  EXAM: ULTRASOUND ABDOMEN COMPLETE  COMPARISON:  None.  FINDINGS: Gallbladder:  No gallstones or wall thickening visualized. No sonographic Murphy sign noted.  Common bile duct:  Diameter: 3.8 mm.  Liver:  No focal lesion identified. Within normal limits in parenchymal echogenicity.  IVC:  No abnormality visualized.  Pancreas:  Visualized portion unremarkable.  Spleen:  Size and appearance within normal limits.  Right Kidney:  Length: 12.4 cm. Echogenicity within normal limits. No mass or hydronephrosis visualized.  Left Kidney:  Length: 10.9 cm. Echogenicity within normal limits. No mass or hydronephrosis visualized.  Abdominal aorta:  No aneurysm visualized.  Other findings:  None.  IMPRESSION: Unremarkable abdominal ultrasound.   Electronically Signed   By: Rolla Flatten M.D.   On: 09/20/2013 11:21   US Transvaginal Non-ob  09/20/2013   CLINICAL DATA:  Severe pelvic pain. Clinical suspicion for ovarian torsion. LMP 05/11/13.  EXAM: TRANSABDOMINAL AND TRANSVAGINAL ULTRASOUND OF PELVIS  DOPPLER ULTRASOUND OF OVARIES  TECHNIQUE: Both transabdominal and transvaginal ultrasound examinations of the pelvis were performed. Transabdominal technique was performed for global imaging of the pelvis including uterus, ovaries, adnexal regions, and pelvic cul-de-sac.  It was necessary to proceed with endovaginal exam following the transabdominal exam to visualize the endometrium and ovaries. Color and duplex Doppler ultrasound was utilized to evaluate blood flow to the ovaries.  COMPARISON:  08/16/2012  FINDINGS: Uterus  Measurements: 12.1 x 7.7 x 7.4 cm. Multiple uterine fibroids are seen which involve the uterus diffusely. Some of these have  submucosal components with mass effect on the endometrial cavity. Largest is located in the posterior corpus measuring 5.5 cm.  Endometrium  Thickness: Not well visualized. Multiple submucosal fibroids obscure endometrium. Tiny amount of fluid noted in endometrial cavity.  Right ovary  Measurements: 2.7 x 1.1 x 3.1 cm. Normal appearance/no adnexal mass.  Left ovary  Measurements: 2.3 x 2.0 x 1.9 cm. Normal appearance/no adnexal mass.  Pulsed Doppler evaluation of both ovaries demonstrates normal low-resistance arterial and venous waveforms.  Other findings  No free fluid.  IMPRESSION: Multiple uterine fibroids measuring up to 5.5 cm, some a which show increased in size compared with previous study.  Normal appearance of both ovaries.  No adnexal mass identified.  No sonographic evidence for ovarian torsion.   Electronically Signed   By: Earle Gell M.D.   On: 09/20/2013 11:35   US Pelvis Complete  09/20/2013   CLINICAL DATA:  Severe pelvic pain. Clinical suspicion for ovarian torsion. LMP 05/11/13.  EXAM: TRANSABDOMINAL AND TRANSVAGINAL ULTRASOUND OF PELVIS  DOPPLER ULTRASOUND OF OVARIES  TECHNIQUE: Both transabdominal and transvaginal ultrasound examinations of the pelvis were performed. Transabdominal technique was performed for global imaging of the pelvis  including uterus, ovaries, adnexal regions, and pelvic cul-de-sac.  It was necessary to proceed with endovaginal exam following the transabdominal exam to visualize the endometrium and ovaries. Color and duplex Doppler ultrasound was utilized to evaluate blood flow to the ovaries.  COMPARISON:  08/16/2012  FINDINGS: Uterus  Measurements: 12.1 x 7.7 x 7.4 cm. Multiple uterine fibroids are seen which involve the uterus diffusely. Some of these have submucosal components with mass effect on the endometrial cavity. Largest is located in the posterior corpus measuring 5.5 cm.  Endometrium  Thickness: Not well visualized. Multiple submucosal fibroids obscure  endometrium. Tiny amount of fluid noted in endometrial cavity.  Right ovary  Measurements: 2.7 x 1.1 x 3.1 cm. Normal appearance/no adnexal mass.  Left ovary  Measurements: 2.3 x 2.0 x 1.9 cm. Normal appearance/no adnexal mass.  Pulsed Doppler evaluation of both ovaries demonstrates normal low-resistance arterial and venous waveforms.  Other findings  No free fluid.  IMPRESSION: Multiple uterine fibroids measuring up to 5.5 cm, some a which show increased in size compared with previous study.  Normal appearance of both ovaries.  No adnexal mass identified.  No sonographic evidence for ovarian torsion.   Electronically Signed   By: Earle Gell M.D.   On: 09/20/2013 11:35   [place] Art/ven Flow Abd Pelv Doppler  09/20/2013   CLINICAL DATA:  Severe pelvic pain. Clinical suspicion for ovarian torsion. LMP 05/11/13.  EXAM: TRANSABDOMINAL AND TRANSVAGINAL ULTRASOUND OF PELVIS  DOPPLER ULTRASOUND OF OVARIES  TECHNIQUE: Both transabdominal and transvaginal ultrasound examinations of the pelvis were performed. Transabdominal technique was performed for global imaging of the pelvis including uterus, ovaries, adnexal regions, and pelvic cul-de-sac.  It was necessary to proceed with endovaginal exam following the transabdominal exam to visualize the endometrium and ovaries. Color and duplex Doppler ultrasound was utilized to evaluate blood flow to the ovaries.  COMPARISON:  08/16/2012  FINDINGS: Uterus  Measurements: 12.1 x 7.7 x 7.4 cm. Multiple uterine fibroids are seen which involve the uterus diffusely. Some of these have submucosal components with mass effect on the endometrial cavity. Largest is located in the posterior corpus measuring 5.5 cm.  Endometrium  Thickness: Not well visualized. Multiple submucosal fibroids obscure endometrium. Tiny amount of fluid noted in endometrial cavity.  Right ovary  Measurements: 2.7 x 1.1 x 3.1 cm. Normal appearance/no adnexal mass.  Left ovary  Measurements: 2.3 x 2.0 x 1.9 cm. Normal  appearance/no adnexal mass.  Pulsed Doppler evaluation of both ovaries demonstrates normal low-resistance arterial and venous waveforms.  Other findings  No free fluid.  IMPRESSION: Multiple uterine fibroids measuring up to 5.5 cm, some a which show increased in size compared with previous study.  Normal appearance of both ovaries.  No adnexal mass identified.  No sonographic evidence for ovarian torsion.   Electronically Signed   By: Earle Gell M.D.   On: 09/20/2013 11:35     EKG Interpretation None      MDM   Final diagnoses:  BV (bacterial vaginosis)   Medications  metroNIDAZOLE (FLAGYL) tablet 2,000 mg (2,000 mg Oral Given 09/20/13 1315)  ondansetron (ZOFRAN-ODT) disintegrating tablet 4 mg (4 mg Oral Given 09/20/13 1315)    Filed Vitals:   09/20/13 0847 09/20/13 0900 09/20/13 1115  BP: 122/82 114/75 116/67  Pulse: 98 94   Temp: 97.9 F (36.6 C)    TempSrc: Oral    Resp: 17    Weight: 193 lb (87.544 kg)    SpO2: 99% 99%     CBC  negative elevated white blood cell count identified. CMP negative findings-kidney and liver functioning well. Lipase negative elevation. Urinalysis negative hemoglobin identified in the urine-negative nitrites leukocytes noted. Urine pregnancy negative. Pelvic ultrasound negative evidence for ovarian torsion. Multiple uterine fibroids measuring up to 5.5 cm which has shown increased in size from previous study. Normal appearance of the ovaries. No adnexal masses noted. Ultrasound of abdomen unremarkable fashion negative findings for acute abnormalities of the gallbladder. Wet prep identified many white blood cells. GC Chlamydia probe pending. Negative findings or trichomoniasis-doubt TOA. Doubt acute abdominal processes-abdomen soft-nonsurgical abdomen noted. HIV-1 and 2 testing performed on 09/01/2013 was nonreactive. Patient presenting to the ED with possible BV. Patient has unprotected sex-we'll treat patient prophylactically. Patient stable, afebrile.  Patient does not appear in any respiratory or significant distress. Patient not septic appearing. Discharged patient. Discharge patient with Flagyl and Doxy. Referred patient to health department, OB/GYN. Discussed with patient to rest and stay hydrated. Discussed with patient to closely monitor symptoms and if symptoms are to worsen or change to report back to the ED - strict return instructions given.  Patient agreed to plan of care, understood, all questions answered.   Jamse Mead, PA-C 09/20/13 1858

## 2013-09-20 NOTE — ED Notes (Signed)
Patient transported to Ultrasound 

## 2013-09-21 LAB — GC/CHLAMYDIA PROBE AMP
CT Probe RNA: NEGATIVE
GC Probe RNA: NEGATIVE

## 2013-09-22 NOTE — ED Provider Notes (Signed)
Medical screening examination/treatment/procedure(s) were performed by non-physician practitioner and as supervising physician I was immediately available for consultation/collaboration.   EKG Interpretation None        Saddie Benders. Zamir Staples, MD 09/22/13 1497

## 2013-09-26 ENCOUNTER — Other Ambulatory Visit (INDEPENDENT_AMBULATORY_CARE_PROVIDER_SITE_OTHER): Payer: Medicaid Other

## 2013-09-26 DIAGNOSIS — I10 Essential (primary) hypertension: Secondary | ICD-10-CM

## 2013-09-26 LAB — BASIC METABOLIC PANEL
BUN: 9 mg/dL (ref 6–23)
CO2: 24 meq/L (ref 19–32)
Calcium: 8.8 mg/dL (ref 8.4–10.5)
Chloride: 104 mEq/L (ref 96–112)
Creatinine, Ser: 0.8 mg/dL (ref 0.4–1.2)
GFR: 102.63 mL/min (ref >60.00)
GLUCOSE: 91 mg/dL (ref 70–99)
POTASSIUM: 3.6 meq/L (ref 3.5–5.1)
Sodium: 135 mEq/L (ref 135–145)

## 2013-10-01 ENCOUNTER — Ambulatory Visit (HOSPITAL_BASED_OUTPATIENT_CLINIC_OR_DEPARTMENT_OTHER): Payer: Medicaid Other | Attending: Internal Medicine

## 2013-10-01 VITALS — Ht 68.0 in | Wt 197.0 lb

## 2013-10-01 DIAGNOSIS — G4719 Other hypersomnia: Secondary | ICD-10-CM

## 2013-10-01 DIAGNOSIS — G4733 Obstructive sleep apnea (adult) (pediatric): Secondary | ICD-10-CM | POA: Diagnosis not present

## 2013-10-01 DIAGNOSIS — G4710 Hypersomnia, unspecified: Secondary | ICD-10-CM | POA: Diagnosis present

## 2013-10-04 ENCOUNTER — Telehealth: Payer: Self-pay | Admitting: *Deleted

## 2013-10-04 NOTE — Telephone Encounter (Signed)
Chelsea Branch called and left a message she wants a nurse to call her re:  Had mirena inserted and is having side effects.  States she is having significant hair loss and mirena is causing her to hurt.   Per chart review Chelsea Branch has multiple medical issues and already has alopecia.  Mirena not noted to cause hair loss per mirena literature.  Called Chelsea Branch- left message we are returning her call- please call clinic.

## 2013-10-04 NOTE — Telephone Encounter (Signed)
Pt. Returned call. Pt. States she is having lots of hair loss and has noticed a significant decline in her health since she has had her mirena inserted. I informed pt. That hair loss is not a side effect of the Mirena. Pt. Stated was told it was from a nurse from [facility]. Pt. Asked what she should do about her hair loss. Advised pt. That she should see her primary care doctor for this issue. Pt. Verbalized understanding but states she wouldlike to discuss getting the Mirena out. Informed pt. We can certainly make her an appointment to discuss this; message sent to admin pool and informed pt. She should be hearing from staff tomorrow with an appointment. Pt. Verbalized understanding and gratitude. No further questions or concerns.

## 2013-10-08 DIAGNOSIS — G4730 Sleep apnea, unspecified: Secondary | ICD-10-CM

## 2013-10-08 DIAGNOSIS — G4710 Hypersomnia, unspecified: Secondary | ICD-10-CM

## 2013-10-08 NOTE — Sleep Study (Signed)
   NAME: Chelsea Branch DATE OF BIRTH:  05-31-1965 MEDICAL RECORD NUMBER [id number]  LOCATION: [facility]  PHYSICIAN: Armando Reichert Doreena Maulden  DATE OF STUDY: 10/01/2013  SLEEP STUDY TYPE: Nocturnal Polysomnogram               REFERRING PHYSICIAN: Deboraha Sprang, MD  INDICATION FOR STUDY: Hypersomnia with sleep apnea  EPWORTH SLEEPINESS SCORE:  6 HEIGHT: 5\' 8"  (172.7 cm)  WEIGHT: 197 lb (89.359 kg)    Body mass index is 29.96 kg/(m^2).  NECK SIZE: 15 in.  MEDICATIONS: Reviewed in the sleep record  SLEEP ARCHITECTURE: The patient had a total sleep time of 313 minutes with no slow-wave sleep and only 56 minutes of REM. Sleep onset latency was normal at 17 minutes, and REM onset was normal at 74 minutes. Sleep efficiency was mildly reduced at 84%.  RESPIRATORY DATA: The patient was found to have 26 apneas and 70 obstructive hypopneas, giving her an AHI of 18 events per hour. The events occurred primarily during REM, and also in the supine position. There was loud snoring noted throughout. The patient did not meet split-night protocol secondary to her small numbers of events prior to 1 AM.  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 Treatment for this degree of sleep apnea can include a trial of weight loss alone, upper airway surgery, dental appliance, and also CPAP. Clinical correlation is suggested.  2) occasional PVC noted, but no clinically significant arrhythmias were seen.     [place], American Board of Sleep Medicine  ELECTRONICALLY SIGNED ON:  10/08/2013, 6:14 PM [place] PH: [phone]   FX: [phone] [place]

## 2013-10-09 ENCOUNTER — Telehealth: Payer: Self-pay | Admitting: *Deleted

## 2013-10-09 NOTE — Telephone Encounter (Signed)
Pt called nurse line concerned about bleeding with Mirena.  Called patient and patient is concerned over bleeding even though she has mirena, Pt states she can feel strings so she knows it is in place.  She states she has fibroids and is concerned.  Informed patient if she is saturating a pad in an hour, unrelieved pain, or the mirena comes out to go to [facility], otherwise it may be her cycle that occurs with Mirena.  Pt verbalizes understanding.

## 2013-10-16 ENCOUNTER — Telehealth: Payer: Self-pay | Admitting: Pulmonary Disease

## 2013-10-16 NOTE — Telephone Encounter (Signed)
Anderson Malta, please see if we can get this pt in for a sleep consult per Dr. Caryl Comes.  His nurse will send in referral . Thanks.

## 2013-10-17 NOTE — Telephone Encounter (Signed)
ATC pt contact #, message states subscriber not available at this time. ATC emergency contact, mailbox full. [place] Bing, CMA

## 2013-10-24 NOTE — Telephone Encounter (Signed)
ATC pt contact #, message states subscriber not available. Called and [id number] on EC #. [place] Bing, CMA

## 2013-10-25 NOTE — Telephone Encounter (Signed)
ATC all contact # in chart. Only working # is for pt mother.  Bing, CMA

## 2013-10-30 NOTE — Telephone Encounter (Signed)
I have not been able to get in contact with this patient to schedule an appointment. Let me know if there is any other contact info you may have and I will be glad to set the appointment. Thanks! Ch[place] Bing, CMA

## 2013-11-01 ENCOUNTER — Telehealth: Payer: Self-pay | Admitting: *Deleted

## 2013-11-01 DIAGNOSIS — N946 Dysmenorrhea, unspecified: Secondary | ICD-10-CM

## 2013-11-01 MED ORDER — IBUPROFEN 800 MG PO TABS: 800.0000 mg | ORAL_TABLET | Freq: Three times a day (TID) | ORAL | Status: DC | PRN

## 2013-11-01 NOTE — Telephone Encounter (Signed)
Patient called in requesting refill on her ibuprofen. Refill sent to pharmacy per Dr. Elly Modena verbal order.

## 2013-11-06 ENCOUNTER — Encounter: Payer: Self-pay | Admitting: Internal Medicine

## 2013-11-06 NOTE — Telephone Encounter (Signed)
Chelsea Branch - I too have been unable to contact this patient.  All numbers, including emergency contacts are unavailable/not working. I will place a note in her chart. My suggestion would be to try and contact patient by letter. She may call in if letter requesting to make appt is sent. Please let me know if I can assist in any other manner.

## 2013-11-07 ENCOUNTER — Encounter: Payer: Self-pay | Admitting: *Deleted

## 2013-11-07 NOTE — Telephone Encounter (Signed)
Ok I will send the pt a letter. Letter sent for the pt to contact our office to set an appt. [place] Bing, CMA

## 2013-11-12 ENCOUNTER — Telehealth: Payer: Self-pay | Admitting: Internal Medicine

## 2013-11-12 NOTE — Telephone Encounter (Signed)
New problem   Pt want to know results of her sleep test. Please call pt.

## 2013-11-14 ENCOUNTER — Ambulatory Visit: Payer: Self-pay | Admitting: Obstetrics & Gynecology

## 2013-11-18 ENCOUNTER — Inpatient Hospital Stay (HOSPITAL_COMMUNITY)
Admission: AD | Admit: 2013-11-18 | Discharge: 2013-11-18 | Disposition: A | Discharge status: Home / Self Care | Payer: Medicaid Other | Source: Ambulatory Visit | Attending: Obstetrics & Gynecology | Admitting: Obstetrics & Gynecology

## 2013-11-18 ENCOUNTER — Encounter (HOSPITAL_COMMUNITY): Payer: Self-pay

## 2013-11-18 DIAGNOSIS — I428 Other cardiomyopathies: Secondary | ICD-10-CM | POA: Diagnosis not present

## 2013-11-18 DIAGNOSIS — N898 Other specified noninflammatory disorders of vagina: Secondary | ICD-10-CM | POA: Diagnosis not present

## 2013-11-18 DIAGNOSIS — I10 Essential (primary) hypertension: Secondary | ICD-10-CM | POA: Diagnosis not present

## 2013-11-18 DIAGNOSIS — N925 Other specified irregular menstruation: Secondary | ICD-10-CM

## 2013-11-18 DIAGNOSIS — F17200 Nicotine dependence, unspecified, uncomplicated: Secondary | ICD-10-CM | POA: Diagnosis not present

## 2013-11-18 DIAGNOSIS — N938 Other specified abnormal uterine and vaginal bleeding: Secondary | ICD-10-CM | POA: Diagnosis not present

## 2013-11-18 DIAGNOSIS — D259 Leiomyoma of uterus, unspecified: Secondary | ICD-10-CM

## 2013-11-18 DIAGNOSIS — I509 Heart failure, unspecified: Secondary | ICD-10-CM | POA: Insufficient documentation

## 2013-11-18 DIAGNOSIS — N949 Unspecified condition associated with female genital organs and menstrual cycle: Secondary | ICD-10-CM | POA: Diagnosis not present

## 2013-11-18 LAB — URINALYSIS, ROUTINE W REFLEX MICROSCOPIC
BILIRUBIN URINE: NEGATIVE
Glucose, UA: NEGATIVE mg/dL
Ketones, ur: NEGATIVE mg/dL
Leukocytes, UA: NEGATIVE
NITRITE: NEGATIVE
Protein, ur: NEGATIVE mg/dL
SPECIFIC GRAVITY, URINE: 1.01 (ref 1.005–1.030)
UROBILINOGEN UA: 0.2 mg/dL (ref 0.0–1.0)
pH: 5.5 (ref 5.0–8.0)

## 2013-11-18 LAB — CBC
HCT: 41.6 % (ref 36.0–46.0)
Hemoglobin: 14.2 g/dL (ref 12.0–15.0)
MCH: 28 pg (ref 26.0–34.0)
MCHC: 34.1 g/dL (ref 30.0–36.0)
MCV: 82.1 fL (ref 78.0–100.0)
Platelets: 261 10*3/uL (ref 150–400)
RBC: 5.07 MIL/uL (ref 3.87–5.11)
RDW: 17.8 % — AB (ref 11.5–15.5)
WBC: 7.3 10*3/uL (ref 4.0–10.5)

## 2013-11-18 LAB — WET PREP, GENITAL
Trich, Wet Prep: NONE SEEN
Yeast Wet Prep HPF POC: NONE SEEN

## 2013-11-18 LAB — URINE MICROSCOPIC-ADD ON

## 2013-11-18 LAB — POCT PREGNANCY, URINE: Preg Test, Ur: NEGATIVE

## 2013-11-18 MED ORDER — OXYCODONE-ACETAMINOPHEN 5-325 MG PO TABS: 2.0000 | ORAL_TABLET | ORAL | Status: DC | PRN

## 2013-11-18 MED ORDER — KETOROLAC TROMETHAMINE 60 MG/2ML IM SOLN
60.0000 mg | Freq: Once | INTRAMUSCULAR | Status: AC
  Administered 2013-11-18: 60 mg via INTRAMUSCULAR
  Filled 2013-11-18: qty 2

## 2013-11-18 MED ORDER — PROMETHAZINE HCL 25 MG PO TABS: 25.0000 mg | ORAL_TABLET | Freq: Four times a day (QID) | ORAL | Status: DC | PRN

## 2013-11-18 NOTE — [facility]U Note (Signed)
Pt presents to [facility]U with complaints of heavy vaginal bleeding for five days. She states that she has a mirena and it isn't helping with the bleeding. HX of fibroids

## 2013-11-18 NOTE — MAU Note (Signed)
Pt states was having back pain then Friday night began "pouring blood". Feeling a lot of pressure. Has been through 8 overnight pads in past two days.

## 2013-11-18 NOTE — MAU Provider Note (Signed)
Attestation of Attending Supervision of Advanced Practitioner (CNM/NP): Evaluation and management procedures were performed by the Advanced Practitioner under my supervision and collaboration.  I have reviewed the Advanced Practitioner's note and chart, and I agree with the management and plan.  HARRAWAY-SMITH, CAROLYN 2:21 PM

## 2013-11-18 NOTE — MAU Provider Note (Signed)
History     CSN: [id number]  Arrival date and time: 11/18/13 1119   First Provider Initiated Contact with Patient 11/18/13 1210      Chief Complaint  Patient presents with  . Vaginal Bleeding   HPI Comments: Chelsea Branch 48 y.o. [id number] presents to MAU with abnormal vaginal bleeding x 1 year and worse in past 4 days. She also has discomfort and states her pain is often "10" on 1/10 scale. She has fibroids and a Mirenia IUD to control bleeding. She has been on Megace in past with some success. The Manuella Ghazi seemed to be working till this past month. She has used 8 overnight pads in last 2 days. Last H/H=12.8/ 41.  In July of last year it was 8.11/27.3. She has been told she is a risky surgical candidate due to multiple health issues. Endometrial biopsy was negative in Dec 2013  Vaginal Bleeding      Past Medical History  Diagnosis Date  . Chronic combined systolic and diastolic CHF (congestive heart failure)     a. 07/2013  EF of 25-30%, LV, mod LVH, diffuse hypokinesis and mild MR  . Hypertension   . MITRAL REGURGITATION 10/15/2008  . DYSLIPIDEMIA 06/08/2010  . 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 Alopecia   . Nonischemic cardiomyopathy     iniatially presumed 2/2 peripartum with improvement and then worsening - EF 25-30% by echo 07/2013.  Marland Kitchen Noncompliance   . Tobacco abuse   . Fibroids   . Abnormal uterine bleeding   . Stroke   . Menorrhagia   . Peripartum cardiomyopathy     Past Surgical History  Procedure Laterality Date  . Loop recorder implant  ~ 2000  . Cesarean section  1992  1994  . Tubal ligation  1994  . Loop recorder explant      Family History  Problem Relation Age of Onset  . Other Neg Hx   . Cancer Maternal Grandmother     uterine  . Hypertension Sister     History  Substance Use Topics  . Smoking status: Current Every Day Smoker -- 0.12 packs/day for 30 years    Types: Cigarettes  . Smokeless tobacco: Never Used  . Alcohol Use: No     Allergies:  Allergies  Allergen Reactions  . Ace Inhibitors     REACTION: Cough    Prescriptions prior to admission  Medication Sig Dispense Refill  . furosemide (LASIX) 40 MG tablet Take 1 tablet (40 mg total) by mouth daily.  30 tablet  2  . hydrALAZINE (APRESOLINE) 25 MG tablet Take 1 tablet (25 mg total) by mouth 2 (two) times daily.  180 tablet  3  . losartan (COZAAR) 25 MG tablet Take 1 tablet (25 mg total) by mouth 2 (two) times daily.  60 tablet  6  . potassium chloride SA (K-DUR,KLOR-CON) 20 MEQ tablet Take 1 tablet (20 mEq total) by mouth 2 (two) times daily.  60 tablet  5  . simvastatin (ZOCOR) 10 MG tablet Take 10 mg by mouth daily.      Marland Kitchen spironolactone (ALDACTONE) 25 MG tablet Take 1 tablet (25 mg total) by mouth daily.  30 tablet  11    Review of Systems  Constitutional: Negative.   HENT: Negative.   Eyes: Negative.   Respiratory: Negative.   Cardiovascular: Negative.   Gastrointestinal: Negative.   Genitourinary: Positive for vaginal bleeding.       Vaginal bleeding  Musculoskeletal: Negative.   Skin: Negative.   Neurological:  Negative.   Psychiatric/Behavioral: Negative.    Physical Exam   Blood pressure 109/72, pulse 88, temperature 97.9 F (36.6 C), temperature source Oral, resp. rate 16, height 5\' 8"  (1.727 m), weight 92.534 kg (204 lb), last menstrual period 11/16/2013.  Physical Exam  Constitutional: She appears well-developed and well-nourished. No distress.  HENT:  Head: Normocephalic and atraumatic.  Eyes: Pupils are equal, round, and reactive to light.  Neck: Normal range of motion. Neck supple.  Genitourinary:  Genital: External negative Vaginal: moderate amount blood/ 2 fox swabs full Cervix: IUD strings seen Bimanual:Tender/ fibroids felt   Results for orders placed during the hospital encounter of 11/18/13 (from the past 24 hour(s))  URINALYSIS, ROUTINE W REFLEX MICROSCOPIC     Status: Abnormal   Collection Time    11/18/13 11:40  AM      Result Value Ref Range   Color, Urine RED (*) YELLOW   APPearance HAZY (*) CLEAR   Specific Gravity, Urine 1.010  1.005 - 1.030   pH 5.5  5.0 - 8.0   Glucose, UA NEGATIVE  NEGATIVE mg/dL   Hgb urine dipstick LARGE (*) NEGATIVE   Bilirubin Urine NEGATIVE  NEGATIVE   Ketones, ur NEGATIVE  NEGATIVE mg/dL   Protein, ur NEGATIVE  NEGATIVE mg/dL   Urobilinogen, UA 0.2  0.0 - 1.0 mg/dL   Nitrite NEGATIVE  NEGATIVE   Leukocytes, UA NEGATIVE  NEGATIVE  URINE MICROSCOPIC-ADD ON     Status: None   Collection Time    11/18/13 11:40 AM      Result Value Ref Range   Squamous Epithelial / LPF RARE  RARE   RBC / HPF TOO NUMEROUS TO COUNT  <3 RBC/hpf  POCT PREGNANCY, URINE     Status: None   Collection Time    11/18/13 11:48 AM      Result Value Ref Range   Preg Test, Ur NEGATIVE  NEGATIVE  CBC     Status: Abnormal   Collection Time    11/18/13 12:20 PM      Result Value Ref Range   WBC 7.3  4.0 - 10.5 K/uL   RBC 5.07  3.87 - 5.11 MIL/uL   Hemoglobin 14.2  12.0 - 15.0 g/dL   HCT 41.6  36.0 - 46.0 %   MCV 82.1  78.0 - 100.0 fL   MCH 28.0  26.0 - 34.0 pg   MCHC 34.1  30.0 - 36.0 g/dL   RDW 17.8 (*) 11.5 - 15.5 %   Platelets 261  150 - 400 K/uL    MAU Course  Procedures  MDM  CBC Toradol 60 mg IM/ pain somewhat improved Spoke with Dr Ihor Dow who advised sending patient home   Assessment and Plan  A: DUB secondary to fibroids  P: Percocet/ phenergan for acute pains Will make follow appointment with GYN clinic Return to MAU for emergencies   Chelsea Branch 11/18/2013, 12:32 PM

## 2013-11-18 NOTE — Discharge Instructions (Signed)
Abnormal Uterine Bleeding Abnormal uterine bleeding can affect women at various stages in life, including teenagers, women in their reproductive years, pregnant women, and women who have reached menopause. Several kinds of uterine bleeding are considered abnormal, including:  Bleeding or spotting between periods.   Bleeding after sexual intercourse.   Bleeding that is heavier or more than normal.   Periods that last longer than usual.  Bleeding after menopause.  Many cases of abnormal uterine bleeding are minor and simple to treat, while others are more serious. Any type of abnormal bleeding should be evaluated by your health care provider. Treatment will depend on the cause of the bleeding. HOME CARE INSTRUCTIONS Monitor your condition for any changes. The following actions may help to alleviate any discomfort you are experiencing:  Avoid the use of tampons and douches as directed by your health care provider.  Change your pads frequently. You should get regular pelvic exams and Pap tests. Keep all follow-up appointments for diagnostic tests as directed by your health care provider.  SEEK MEDICAL CARE IF:   Your bleeding lasts more than 1 week.   You feel dizzy at times.  SEEK IMMEDIATE MEDICAL CARE IF:   You pass out.   You are changing pads every 15 to 30 minutes.   You have abdominal pain.  You have a fever.   You become sweaty or weak.   You are passing large blood clots from the vagina.   You start to feel nauseous and vomit. MAKE SURE YOU:   Understand these instructions.  Will watch your condition.  Will get help right away if you are not doing well or get worse. Document Released: 05/10/2005 Document Revised: 05/15/2013 Document Reviewed: 12/07/2012 [facility] Patient Information 2015 [place], [place]. This information is not intended to replace advice given to you by your health care provider. Make sure you discuss any questions you have with your  health care provider.  Fibroids Fibroids are lumps (tumors) that can occur any place in a woman's body. These lumps are not cancerous. Fibroids vary in size, weight, and where they grow. HOME CARE  Do not take aspirin.  Write down the number of pads or tampons you use during your period. Tell your doctor. This can help determine the best treatment for you. GET HELP RIGHT AWAY IF:  You have pain in your lower belly (abdomen) that is not helped with medicine.  You have cramps that are not helped with medicine.  You have more bleeding between or during your period.  You feel lightheaded or pass out (faint).  Your lower belly pain gets worse. MAKE SURE YOU:  Understand these instructions.  Will watch your condition.  Will get help right away if you are not doing well or get worse. Document Released: 06/12/2010 Document Revised: 08/02/2011 Document Reviewed: 06/12/2010 [facility] Patient Information 2015 [place], [place]. This information is not intended to replace advice given to you by your health care provider. Make sure you discuss any questions you have with your health care provider.  Abdominal Hysterectomy Abdominal hysterectomy is a surgery to remove your womb (uterus). Your womb is the part of your body that contains a growing baby. The surgery may be done for many reasons. These may include cancer, growths (tumors), long-term pain, or bleeding. You may also need other reproductive parts removed during this surgery. This will depend on why you need to have the surgery. BEFORE THE PROCEDURE  Talk to your doctor about the changes to your body. These changes  may be physical and emotional.  You may need to have blood work done. You may also need X-rays done.  Quit smoking if you smoke. Ask your doctor for help.  Stop taking medicines that thin your blood as told by your doctor.  Your doctor may have you take other medicines. Take all medicines as told by your doctor.  Do not  eat or drink anything for 6-8 hours before surgery.  Take your normal medicines with a small sip of water.  Shower or take a bath the night or morning before surgery. PROCEDURE  This surgery is done in the hospital.  You are given a medicine that makes you go to sleep (general anesthetic).  The doctor will make a cut (incision) through the skin in your lower belly.  The cut may be about 5-7 inches long. It may go side-to-side or up-and-down.  The doctor will move the body tissue that covers your womb. The doctor will carefully remove your womb. The doctor may remove any other reproductive parts that need to be removed.  The doctor will use clamps or stitches (sutures) to control bleeding.  The doctor will close your cut with stitches or metal clips. AFTER THE PROCEDURE  You will have pain right after the procedure.  You will be given pain medicine in the recovery room.  You will be taken to your hospital room after the medicines that made you go to sleep wear off.  You will be told how to take care of yourself at home. Document Released: 05/15/2013 Document Reviewed: 03/02/2013 [facility] Patient Information 2015 [place], [place]. This information is not intended to replace advice given to you by your health care provider. Make sure you discuss any questions you have with your health care provider.

## 2013-11-18 NOTE — MAU Note (Signed)
No adverse reaction to toradol, rates pain 8/10

## 2013-11-19 LAB — GC/CHLAMYDIA PROBE AMP
CT PROBE, AMP APTIMA: NEGATIVE
GC PROBE AMP APTIMA: NEGATIVE

## 2013-11-20 NOTE — Telephone Encounter (Addendum)
Attempted to call pt - both numbers non-working.   (want to inform patient Dr. Janifer Adie office have been trying to reach her to schedule appt)

## 2013-11-29 ENCOUNTER — Ambulatory Visit (INDEPENDENT_AMBULATORY_CARE_PROVIDER_SITE_OTHER): Payer: Medicaid Other | Admitting: Internal Medicine

## 2013-11-29 ENCOUNTER — Encounter: Payer: Self-pay | Admitting: Internal Medicine

## 2013-11-29 ENCOUNTER — Telehealth: Payer: Self-pay | Admitting: General Practice

## 2013-11-29 VITALS — BP 111/72 | HR 97 | Ht 68.0 in | Wt 202.0 lb

## 2013-11-29 DIAGNOSIS — I509 Heart failure, unspecified: Secondary | ICD-10-CM

## 2013-11-29 DIAGNOSIS — I5042 Chronic combined systolic (congestive) and diastolic (congestive) heart failure: Secondary | ICD-10-CM

## 2013-11-29 DIAGNOSIS — I428 Other cardiomyopathies: Secondary | ICD-10-CM

## 2013-11-29 LAB — BASIC METABOLIC PANEL
BUN: 10 mg/dL (ref 6–23)
CALCIUM: 9.5 mg/dL (ref 8.4–10.5)
CO2: 26 mEq/L (ref 19–32)
Chloride: 101 mEq/L (ref 96–112)
Creatinine, Ser: 0.8 mg/dL (ref 0.4–1.2)
GFR: 99.57 mL/min (ref >60.00)
Glucose, Bld: 92 mg/dL (ref 70–99)
Potassium: 4.1 mEq/L (ref 3.5–5.1)
SODIUM: 133 meq/L — AB (ref 135–145)

## 2013-11-29 NOTE — Progress Notes (Signed)
Patient Care Team: No Pcp Per Patient as PCP - General (General Practice) Deboraha Sprang, MD as PCP - Cardiology (Cardiology)   HPI  Chelsea Branch is a 48 y.o. 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 She had recovery of LV function most recently in 2008. 2011 left ventricular function had decreased to 25%.  She has significant hypertension.   She was rehospitalized 3/15 for acute on chronic congestive heart failure. She was treated with IV diuresis. Echocardiogram demonstrated EF 25-30%.  She is fatigue at rest and with exertion. She has cut down on her fluid intake and does not have edema.  She has applied for disability and Medicaid.  Past Medical History  Diagnosis Date  . Chronic combined systolic and diastolic CHF (congestive heart failure)     a. 07/2013  EF of 25-30%, LV, mod LVH, diffuse hypokinesis and mild MR  . Hypertension   . MITRAL REGURGITATION 10/15/2008  . DYSLIPIDEMIA 06/08/2010  . 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 Alopecia   . Nonischemic cardiomyopathy     iniatially presumed 2/2 peripartum with improvement and then worsening - EF 25-30% by echo 07/2013.  Marland Kitchen Noncompliance   . Tobacco abuse   . Fibroids   . Abnormal uterine bleeding   . Stroke   . Menorrhagia   . Peripartum cardiomyopathy     Past Surgical History  Procedure Laterality Date  . Loop recorder implant  ~ 2000  . Cesarean section  1992  1994  . Tubal ligation  1994  . Loop recorder explant      Current Outpatient Prescriptions  Medication Sig Dispense Refill  . furosemide (LASIX) 40 MG tablet Take 1 tablet (40 mg total) by mouth daily.  30 tablet  2  . hydrALAZINE (APRESOLINE) 25 MG tablet Take 1 tablet (25 mg total) by mouth 2 (two) times daily.  180 tablet  3  . losartan (COZAAR) 25 MG tablet Take 1 tablet (25 mg total) by mouth 2 (two) times daily.  60 tablet  6  . oxyCODONE-acetaminophen (PERCOCET/ROXICET) 5-325 MG per tablet  Take 2 tablets by mouth every 4 (four) hours as needed for moderate pain or severe pain.  10 tablet  0  . potassium chloride SA (K-DUR,KLOR-CON) 20 MEQ tablet Take 1 tablet (20 mEq total) by mouth 2 (two) times daily.  60 tablet  5  . promethazine (PHENERGAN) 25 MG tablet Take 1 tablet (25 mg total) by mouth every 6 (six) hours as needed for nausea or vomiting.  30 tablet  0  . simvastatin (ZOCOR) 10 MG tablet Take 10 mg by mouth daily.      Marland Kitchen spironolactone (ALDACTONE) 25 MG tablet Take 1 tablet (25 mg total) by mouth daily.  30 tablet  11   No current facility-administered medications for this visit.    Allergies  Allergen Reactions  . Ace Inhibitors     REACTION: Cough    Review of Systems negative except from HPI and PMH  Physical Exam BP 111/72  Pulse 97  Ht 5\' 8"  (1.727 m)  Wt 202 lb (91.627 kg)  BMI 30.72 kg/m2  LMP 11/16/2013 Well developed and well nourished in no acute distress HENT normal E scleral and icterus clear Neck Supple JVP flat; carotids brisk and full Clear to ausculation  *Regular rate and rhythm, no murmurs gallops or rub Soft with active bowel sounds No clubbing cyanosis  Edema Alert and oriented, grossly normal motor and sensory  function Skin Warm and Dry  ECG 24 March demonstrated sinus rhythm at 96 intervals 16/09/35 LVH with repolarization  Assessment and  Plan  nonischemic cardiomyopathy  Congestive heart failure-chronic-systolic class III  Alopecia  Obstructive sleep apnea AHI 18   She is euvolemic is still quite limited.     We Will resume her beta blocker. She was unwilling to take the nitroglycerin as it caused her headaches. She is taking valsartan. She is also on Aldactone and potassium. Will check a metabolic profillle  We'll refer her to Dr. Gwenette Greet for her sleep apnea.

## 2013-11-29 NOTE — Telephone Encounter (Signed)
Patient called and left message stating she has been having a lot of problems lately and would like a callback. Called patient and she states that she went to MAU a couple weeks ago for pain in her belly because of the fibroids and she has been gaining so much weight and her stomach has been getting bigger and she would like something figured out because those fibroids have been causing lots of problems. Told patient that it looks like she has an appt with [place] next Wednesday the 15th at 1pm. Patient verbalized understanding and said oh okay well good and had no other questions

## 2013-11-29 NOTE — Patient Instructions (Signed)
Your physician recommends that you continue on your current medications as directed. Please refer to the Current Medication list given to you today.  Lab today: BMET  Please arrange office visit with Dr. Gwenette Greet  Your physician wants you to follow-up in: 6 months with Dr. Caryl Comes. You will receive a reminder letter in the mail two months in advance. If you don't receive a letter, please call our office to schedule the follow-up appointment.  Your physician has requested that you have an echocardiogram in 6 months when you come back to see Dr. Caryl Comes. Echocardiography is a painless test that uses sound waves to create images of your heart. It provides your doctor with information about the size and shape of your heart and how well your heart's chambers and valves are working. This procedure takes approximately one hour. There are no restrictions for this procedure.

## 2013-11-29 NOTE — Telephone Encounter (Signed)
Pt in for office visit today.  Will address with her while she is here.

## 2013-12-05 ENCOUNTER — Telehealth: Payer: Self-pay

## 2013-12-05 ENCOUNTER — Ambulatory Visit: Payer: Medicaid Other | Admitting: Obstetrics & Gynecology

## 2013-12-05 ENCOUNTER — Telehealth: Payer: Self-pay | Admitting: Pulmonary Disease

## 2013-12-05 NOTE — Telephone Encounter (Signed)
Patient called stating she missed her appointment today but that a nurse had called her or someone had called her and told her she had an appointment at 1:45pm and when she got here at 2:00pm was told it was 1:00pm and would like a call back. Called patient and informed her that in our system she had an appointment scheduled for 1300 and she spoke to a nurse on 11/29/13 who also informed her that her appointment was 1300-- apologized that she was confused with times, however, explained that all we can do for her at this point is reschedule her appointment. Informed patient that I would send her information to front office staff and they will call her with an appointment. Patient verbalized understanding. Message sent to admin pool to reschedule patient's appointment.

## 2013-12-05 NOTE — Telephone Encounter (Signed)
Patient missed today's appointment scheduled for 1300. Called patient who states she thought her appointment was 1345-- informed patient it was not-- patient states she is walking into clinic now. Informed patient she will need to reschedule.

## 2013-12-05 NOTE — Telephone Encounter (Signed)
She has moderate osa, and needs to keep her apptm

## 2013-12-05 NOTE — Telephone Encounter (Signed)
Called and spoke with pt and she stated that Dr. Olin Pia office told her that we had been trying to reach her about the sleep study results.  Pt stated that she had this done back in June 2015 and has a pending appt with [place] on 8/18.  Pt would like the results of the sleep study.  [place] please advise. Thanks  Allergies  Allergen Reactions  . Ace Inhibitors     REACTION: Cough    Current Outpatient Prescriptions on File Prior to Visit  Medication Sig Dispense Refill  . furosemide (LASIX) 40 MG tablet Take 1 tablet (40 mg total) by mouth daily.  30 tablet  2  . hydrALAZINE (APRESOLINE) 25 MG tablet Take 1 tablet (25 mg total) by mouth 2 (two) times daily.  180 tablet  3  . losartan (COZAAR) 25 MG tablet Take 1 tablet (25 mg total) by mouth 2 (two) times daily.  60 tablet  6  . oxyCODONE-acetaminophen (PERCOCET/ROXICET) 5-325 MG per tablet Take 2 tablets by mouth every 4 (four) hours as needed for moderate pain or severe pain.  10 tablet  0  . potassium chloride SA (K-DUR,KLOR-CON) 20 MEQ tablet Take 1 tablet (20 mEq total) by mouth 2 (two) times daily.  60 tablet  5  . promethazine (PHENERGAN) 25 MG tablet Take 1 tablet (25 mg total) by mouth every 6 (six) hours as needed for nausea or vomiting.  30 tablet  0  . simvastatin (ZOCOR) 10 MG tablet Take 10 mg by mouth daily.      Marland Kitchen spironolactone (ALDACTONE) 25 MG tablet Take 1 tablet (25 mg total) by mouth daily.  30 tablet  11   No current facility-administered medications on file prior to visit.

## 2013-12-06 NOTE — Telephone Encounter (Signed)
lmomtcb x1 

## 2013-12-07 ENCOUNTER — Encounter: Payer: Self-pay | Admitting: Obstetrics & Gynecology

## 2013-12-07 NOTE — Telephone Encounter (Signed)
lmtcb x1 w/ family member as she is at work

## 2013-12-10 NOTE — Telephone Encounter (Signed)
Called and spoke with pts family and was given her cell number----[phone].  i have called and spoke with pt and she is aware.

## 2013-12-12 ENCOUNTER — Encounter: Payer: Self-pay | Admitting: *Deleted

## 2013-12-12 NOTE — Progress Notes (Signed)
PA completed in [place] for Losartan Potassium 25 mg. Will await approval and notify pharmacy of approval or denied. Vivia Birmingham, RN

## 2013-12-15 ENCOUNTER — Encounter (HOSPITAL_COMMUNITY): Payer: Self-pay | Admitting: *Deleted

## 2013-12-15 ENCOUNTER — Inpatient Hospital Stay (HOSPITAL_COMMUNITY)
Admission: AD | Admit: 2013-12-15 | Discharge: 2013-12-15 | Disposition: A | Discharge status: Home / Self Care | Payer: Medicaid Other | Source: Ambulatory Visit | Attending: Obstetrics & Gynecology | Admitting: Obstetrics & Gynecology

## 2013-12-15 DIAGNOSIS — N949 Unspecified condition associated with female genital organs and menstrual cycle: Secondary | ICD-10-CM

## 2013-12-15 DIAGNOSIS — I517 Cardiomegaly: Secondary | ICD-10-CM | POA: Insufficient documentation

## 2013-12-15 DIAGNOSIS — Z87891 Personal history of nicotine dependence: Secondary | ICD-10-CM | POA: Diagnosis not present

## 2013-12-15 DIAGNOSIS — I10 Essential (primary) hypertension: Secondary | ICD-10-CM | POA: Insufficient documentation

## 2013-12-15 DIAGNOSIS — N938 Other specified abnormal uterine and vaginal bleeding: Secondary | ICD-10-CM | POA: Diagnosis not present

## 2013-12-15 DIAGNOSIS — N925 Other specified irregular menstruation: Secondary | ICD-10-CM

## 2013-12-15 DIAGNOSIS — D259 Leiomyoma of uterus, unspecified: Secondary | ICD-10-CM | POA: Diagnosis not present

## 2013-12-15 DIAGNOSIS — D250 Submucous leiomyoma of uterus: Secondary | ICD-10-CM

## 2013-12-15 LAB — URINALYSIS, ROUTINE W REFLEX MICROSCOPIC
Bilirubin Urine: NEGATIVE
Glucose, UA: NEGATIVE mg/dL
Ketones, ur: NEGATIVE mg/dL
Nitrite: NEGATIVE
Protein, ur: NEGATIVE mg/dL
SPECIFIC GRAVITY, URINE: 1.01 (ref 1.005–1.030)
UROBILINOGEN UA: 0.2 mg/dL (ref 0.0–1.0)
pH: 6.5 (ref 5.0–8.0)

## 2013-12-15 LAB — CBC WITH DIFFERENTIAL/PLATELET
BASOS ABS: 0 10*3/uL (ref 0.0–0.1)
BASOS PCT: 0 % (ref 0–1)
Eosinophils Absolute: 0 10*3/uL (ref 0.0–0.7)
Eosinophils Relative: 0 % (ref 0–5)
HEMATOCRIT: 38.9 % (ref 36.0–46.0)
Hemoglobin: 13.3 g/dL (ref 12.0–15.0)
Lymphocytes Relative: 14 % (ref 12–46)
Lymphs Abs: 1.3 10*3/uL (ref 0.7–4.0)
MCH: 28.5 pg (ref 26.0–34.0)
MCHC: 34.2 g/dL (ref 30.0–36.0)
MCV: 83.5 fL (ref 78.0–100.0)
MONO ABS: 0.3 10*3/uL (ref 0.1–1.0)
Monocytes Relative: 4 % (ref 3–12)
Neutro Abs: 7.9 10*3/uL — ABNORMAL HIGH (ref 1.7–7.7)
Neutrophils Relative %: 82 % — ABNORMAL HIGH (ref 43–77)
Platelets: 269 10*3/uL (ref 150–400)
RBC: 4.66 MIL/uL (ref 3.87–5.11)
RDW: 15.5 % (ref 11.5–15.5)
WBC: 9.6 10*3/uL (ref 4.0–10.5)

## 2013-12-15 LAB — URINE MICROSCOPIC-ADD ON

## 2013-12-15 LAB — POCT PREGNANCY, URINE: PREG TEST UR: NEGATIVE

## 2013-12-15 MED ORDER — KETOROLAC TROMETHAMINE 60 MG/2ML IM SOLN
60.0000 mg | Freq: Once | INTRAMUSCULAR | Status: AC
  Administered 2013-12-15: 60 mg via INTRAMUSCULAR
  Filled 2013-12-15: qty 2

## 2013-12-15 MED ORDER — OXYCODONE-ACETAMINOPHEN 5-325 MG PO TABS: 1.0000 | ORAL_TABLET | ORAL | Status: DC | PRN

## 2013-12-15 NOTE — Discharge Instructions (Signed)
Fibroids Fibroids are lumps (tumors) that can occur any place in a woman's body. These lumps are not cancerous. Fibroids vary in size, weight, and where they grow. HOME CARE  Do not take aspirin.  Write down the number of pads or tampons you use during your period. Tell your doctor. This can help determine the best treatment for you. GET HELP RIGHT AWAY IF:  You have pain in your lower belly (abdomen) that is not helped with medicine.  You have cramps that are not helped with medicine.  You have more bleeding between or during your period.  You feel lightheaded or pass out (faint).  Your lower belly pain gets worse. MAKE SURE YOU:  Understand these instructions.  Will watch your condition.  Will get help right away if you are not doing well or get worse. Document Released: 06/12/2010 Document Revised: 08/02/2011 Document Reviewed: 06/12/2010 [employer] Patient Information 2015 [employer]. This information is not intended to replace advice given to you by your health care provider. Make sure you discuss any questions you have with your health care provider.  

## 2013-12-15 NOTE — MAU Note (Addendum)
Pt states was scheduled to see Dr. Roselie Awkward and was told wrong appt time, so was not able to be seen. Has hx fibroids and feels a lot more pain. Has soaked 4 pads already today. Began bleeding Tuesday. Pain now into back and legs.

## 2013-12-15 NOTE — MAU Provider Note (Signed)
CC: Vaginal Bleeding and Abdominal Pain   Care initiated at 1400    HPI Chelsea Branch is a 48 y.o. [phone] who presents with vaginal bleeding episode which started 5 days ago. States the flow is heavy and is associated with continuous abdominal pain that is constant but waxes and wanes. Similar to pain she was having been seen here 11/18/2013.  This morning pain felt like "having a baby" and radiates to thighs and low back. Now having pain that feels like muscle cramp in left thigh. Ibuprofen not helping and she is out of the Percocet  prescribed 11/18/13.  She has known fibroids and had Mirena IUD placed to control bleeding last October however she has been bleeding intermittently since about March. Taking K-dur. Last BMP 12/01/13 was essentially normal with K+ 4.1. Last H/H 14.2/41.6 on 11/18/13.  Endometrial biopsy neg 04/2012.  Pelvic US 09/20/13: multiple fibroids, some with submucosal component, largest 5.5cm; endo thickness obscured  Has quit smoking She was late for last GYN appointment and was not seen. Has appointment August 28.    Past Medical History  Diagnosis Date  . Chronic combined systolic and diastolic CHF (congestive heart failure)     a. 07/2013  EF of 25-30%, LV, mod LVH, diffuse hypokinesis and mild MR  . Hypertension   . MITRAL REGURGITATION 10/15/2008  . DYSLIPIDEMIA 06/08/2010  . 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 Alopecia   . Nonischemic cardiomyopathy     iniatially presumed 2/2 peripartum with improvement and then worsening - EF 25-30% by echo 07/2013.  Marland Kitchen Noncompliance   . Tobacco abuse   . Fibroids   . Abnormal uterine bleeding   . Stroke   . Menorrhagia   . Peripartum cardiomyopathy     OB History  Gravida Para Term Preterm AB SAB TAB Ectopic Multiple Living  2 2 2      1 3     # Outcome Date GA Lbr Len/2nd Weight Sex Delivery Anes PTL Lv  2A TRM 08/23/92   2.438 kg (5 lb 6 oz) M LTCS   Y  2B  08/23/92   2.863 kg (6 lb 5 oz) M LTCS   Y  1 TRM  09/25/90   3.544 kg (7 lb 13 oz) M LTCS   Y     Comments: cpd      Past Surgical History  Procedure Laterality Date  . Loop recorder implant  ~ 2000  . Cesarean section  1992  1994  . Tubal ligation  1994  . Loop recorder explant      History   Social History  . Marital Status: Single    Spouse Name: N/A    Number of Children: N/A  . Years of Education: N/A   Occupational History  . Not on file.   Social History Main Topics  . Smoking status: Current Every Day Smoker -- 0.12 packs/day for 30 years    Types: Cigarettes  . Smokeless tobacco: Never Used  . Alcohol Use: No  . Drug Use: No  . Sexual Activity: Yes    Birth Control/ Protection: IUD   Other Topics Concern  . Not on file   Social History Narrative  . No narrative on file    No current facility-administered medications on file prior to encounter.   Current Outpatient Prescriptions on File Prior to Encounter  Medication Sig Dispense Refill  . furosemide (LASIX) 40 MG tablet Take 1 tablet (40 mg total) by mouth daily.  30 tablet  2  . hydrALAZINE (APRESOLINE) 25 MG tablet Take 1 tablet (25 mg total) by mouth 2 (two) times daily.  180 tablet  3  . losartan (COZAAR) 25 MG tablet Take 1 tablet (25 mg total) by mouth 2 (two) times daily.  60 tablet  6  . potassium chloride SA (K-DUR,KLOR-CON) 20 MEQ tablet Take 1 tablet (20 mEq total) by mouth 2 (two) times daily.  60 tablet  5  . simvastatin (ZOCOR) 10 MG tablet Take 10 mg by mouth daily.      Marland Kitchen spironolactone (ALDACTONE) 25 MG tablet Take 1 tablet (25 mg total) by mouth daily.  30 tablet  11    Allergies  Allergen Reactions  . Ace Inhibitors     REACTION: Cough    ROS Pertinent items in HPI  PHYSICAL EXAM Filed Vitals:   12/15/13 1346  BP: 112/67  Pulse: 97  Temp:   Resp:    General: Well nourished, well developed female in no acute distress but is distraught Cardiovascular: Normal rate Respiratory: Normal effort Abdomen: Soft, Uterus mildly  tender and 14wk size Back: No CVAT Extremities: No edema Neurologic: Alert and oriented Speculum exam: NEFG; vagina with small to moderate blood; cervix clean and IUD strings about 4 cm long Bimanual exam: cervix closed, no CMT; uterus about 14 wk size and mildly tender; no adnexal tenderness or masses   LAB RESULTS Results for orders placed during the hospital encounter of 12/15/13 (from the past 24 hour(s))  URINALYSIS, ROUTINE W REFLEX MICROSCOPIC     Status: Abnormal   Collection Time    12/15/13 12:54 PM      Result Value Ref Range   Color, Urine RED (*) YELLOW   APPearance TURBID (*) CLEAR   Specific Gravity, Urine 1.010  1.005 - 1.030   pH 6.5  5.0 - 8.0   Glucose, UA NEGATIVE  NEGATIVE mg/dL   Hgb urine dipstick LARGE (*) NEGATIVE   Bilirubin Urine NEGATIVE  NEGATIVE   Ketones, ur NEGATIVE  NEGATIVE mg/dL   Protein, ur NEGATIVE  NEGATIVE mg/dL   Urobilinogen, UA 0.2  0.0 - 1.0 mg/dL   Nitrite NEGATIVE  NEGATIVE   Leukocytes, UA TRACE (*) NEGATIVE  URINE MICROSCOPIC-ADD ON     Status: None   Collection Time    12/15/13 12:54 PM      Result Value Ref Range   Squamous Epithelial / LPF RARE  RARE   WBC, UA 0-2  <3 WBC/hpf   RBC / HPF TOO NUMEROUS TO COUNT  <3 RBC/hpf   Bacteria, UA RARE  RARE  POCT PREGNANCY, URINE     Status: None   Collection Time    12/15/13  1:07 PM      Result Value Ref Range   Preg Test, Ur NEGATIVE  NEGATIVE  CBC WITH DIFFERENTIAL     Status: Abnormal   Collection Time    12/15/13  1:30 PM      Result Value Ref Range   WBC 9.6  4.0 - 10.5 K/uL   RBC 4.66  3.87 - 5.11 MIL/uL   Hemoglobin 13.3  12.0 - 15.0 g/dL   HCT 38.9  36.0 - 46.0 %   MCV 83.5  78.0 - 100.0 fL   MCH 28.5  26.0 - 34.0 pg   MCHC 34.2  30.0 - 36.0 g/dL   RDW 15.5  11.5 - 15.5 %   Platelets 269  150 - 400 K/uL   Neutrophils Relative % 82 (*)  43 - 77 %   Neutro Abs 7.9 (*) 1.7 - 7.7 K/uL   Lymphocytes Relative 14  12 - 46 %   Lymphs Abs 1.3  0.7 - 4.0 K/uL   Monocytes  Relative 4  3 - 12 %   Monocytes Absolute 0.3  0.1 - 1.0 K/uL   Eosinophils Relative 0  0 - 5 %   Eosinophils Absolute 0.0  0.0 - 0.7 K/uL   Basophils Relative 0  0 - 1 %   Basophils Absolute 0.0  0.0 - 0.1 K/uL    IMAGING No results found.  MAU COURSE Toradol 60mg  IM given with some improvement  ASSESSMENT  1. DUB (dysfunctional uterine bleeding)   2. Fibroids, submucosal   Hemodynamically stable   PLAN Discharge home. See AVS for patient education.    Medication List         furosemide 40 MG tablet  Commonly known as:  LASIX  Take 1 tablet (40 mg total) by mouth daily.     hydrALAZINE 25 MG tablet  Commonly known as:  APRESOLINE  Take 1 tablet (25 mg total) by mouth 2 (two) times daily.     ibuprofen 200 MG tablet  Commonly known as:  ADVIL,MOTRIN  Take 400 mg by mouth every 6 (six) hours as needed for moderate pain.     losartan 25 MG tablet  Commonly known as:  COZAAR  Take 1 tablet (25 mg total) by mouth 2 (two) times daily.     oxyCODONE-acetaminophen 5-325 MG per tablet  Commonly known as:  PERCOCET/ROXICET  Take 1 tablet by mouth every 4 (four) hours as needed.     potassium chloride SA 20 MEQ tablet  Commonly known as:  K-DUR,KLOR-CON  Take 1 tablet (20 mEq total) by mouth 2 (two) times daily.     simvastatin 10 MG tablet  Commonly known as:  ZOCOR  Take 10 mg by mouth daily.     spironolactone 25 MG tablet  Commonly known as:  ALDACTONE  Take 1 tablet (25 mg total) by mouth daily.       Follow-up Information   Follow up with [facility]. (Someone from Clinic will call you with appt.)    Contact information:   [place] [address] [phone]      Note  to [facility] re: moving up appointment if possible. May need to clip IUD strings also.    Lorene Dy, CNM 12/15/2013 3:04 PM

## 2013-12-15 NOTE — MAU Provider Note (Signed)
Attestation of Attending Supervision of Advanced Practitioner (PA/CNM/NP): Evaluation and management procedures were performed by the Advanced Practitioner under my supervision and collaboration.  I have reviewed the Advanced Practitioner's note and chart, and I agree with the management and plan.  Melburn Treiber, MD, FACOG Attending Obstetrician & Gynecologist Faculty Practice, [facility]   

## 2013-12-17 ENCOUNTER — Telehealth: Payer: Self-pay | Admitting: General Practice

## 2013-12-17 ENCOUNTER — Other Ambulatory Visit: Payer: Self-pay | Admitting: Obstetrics & Gynecology

## 2013-12-17 DIAGNOSIS — N938 Other specified abnormal uterine and vaginal bleeding: Secondary | ICD-10-CM

## 2013-12-17 MED ORDER — MEGESTROL ACETATE 20 MG PO TABS: 40.0000 mg | ORAL_TABLET | Freq: Every day | ORAL | Status: DC

## 2013-12-17 NOTE — Telephone Encounter (Signed)
Patient called and left message stating she is having problems with her fibroids. She went to the ER on Saturday and has messed up her bed the past two nights from bleeding so much.

## 2013-12-17 NOTE — Telephone Encounter (Signed)
Patient called and left another message stating she is having all this bleeding and has messed up her bed three times now and would like a sooner appt for these problems with her fibroids. Received order for Megace per Dr Ihor Dow. Informed patient of medication and she states she doesn't want to take that stuff and it doesn't work and what are we going to do about moving her up to a sooner appt than the 24th because she can't keep having all these problems and pain. Told patient that's what the megace is for to help stop the bleeding. Patient started to yell stating she missed her last appt time because we told her 1:45 and then when she got here we told her it was at 1 and that the mirena was supposed to stop the bleeding and it hasn't helped. Tried discussing with patient that it does work for most people but doesn't work for everyone and that, that will be discussed at her next visit. Patient kept interrupting me stating she didn't have to come here and that she could go to a different office and what are we going to do about moving her up to a sooner appointment because she knows we can do something. Patient was very rude and kept yelling saying she wasn't going to wait until the 24th. Asked the patient to hold so I could speak with front office staff. After getting sooner appt of 8/29 @ 3:30 patient hung up on me before I could inform her of appt. Called patient, no answer- left message we are calling in regards to an appt and that she can be seen Wednesday the 29th at 3:30 and if she is unable to make this appt please call our front office staff back and that's an appt for this Wednesday at 3:30

## 2013-12-18 ENCOUNTER — Telehealth: Payer: Self-pay | Admitting: Cardiology

## 2013-12-18 NOTE — Telephone Encounter (Signed)
I did not call her. It does not look like she has seen Dr Aundra Dubin. Could you get more information? Thanks.

## 2013-12-18 NOTE — Telephone Encounter (Signed)
Follow up:   Per pt returning this offices call please give her a call back.

## 2013-12-19 ENCOUNTER — Ambulatory Visit (INDEPENDENT_AMBULATORY_CARE_PROVIDER_SITE_OTHER): Payer: Medicaid Other | Admitting: Obstetrics & Gynecology

## 2013-12-19 ENCOUNTER — Encounter: Payer: Self-pay | Admitting: General Practice

## 2013-12-19 VITALS — BP 120/74 | HR 88 | Temp 98.4°F | Ht 68.0 in | Wt 204.1 lb

## 2013-12-19 DIAGNOSIS — D259 Leiomyoma of uterus, unspecified: Secondary | ICD-10-CM

## 2013-12-19 NOTE — Progress Notes (Signed)
   Subjective:    [name], female    DOB: [birth year], 48 y.o.   MRN: [id number]  HPI  CC: heavy bleeding  # Heavy bleeding:  Long history including workup with negative endometrial biopsy  Had mirena placed October 2014, states bleeding was controlled for at least 4 months  Over past 3 weeks she has had daily bleeding and worsened abdominal cramping, using 4-5 pads a day and also soaking bed during the night  Went to MAU 12/15/13 for same issue, reported to see string on exam  Review of Systems No CP, no SOB, no lightheadedness/dizziness, vaginal discharge other than blood    Objective:   Physical Exam BP 120/74  Pulse 88  Temp(Src) 98.4 F (36.9 C)  Ht 5\' 8"  (1.727 m)  Wt 204 lb 1.6 oz (92.579 kg)  BMI 31.04 kg/m2  LMP 12/11/2013  General: NAD Pelvic: normal external genitalia, small amount of blood present. Normal vaginal mucosa/walls, moderate amount of red blood present (2 fox swabs needed) with small amount of clotting coming from the cervical os. IUD strings are not present by visualization or by palpation. Uterus is not overtly large, no adnexal tenderness or masses.     Assessment & Plan:  Chelsea Branch is a 48 y.o. female with heavy prolonged bleeding.   1. IUD check: strings not seen on exam today, seen on 7/25 in MAU. Suspect she has discharged IUD. Last confirmed seen on ultrasound 4/30 (not commented by radiologist but on image review it appears present). Will order ultrasound to confirm expulsion, if IUD is absent will plan on replacing this as she did get some relief after placement.  I was  Present for the exam and I agree with the plan   Woodroe Mode, MD 12/19/2013

## 2013-12-19 NOTE — Progress Notes (Signed)
Pt is bleeding heavily, has Mirena IUD for [employer].

## 2013-12-19 NOTE — Patient Instructions (Signed)
Levonorgestrel intrauterine device (IUD) What is this medicine? LEVONORGESTREL IUD (LEE voe nor jes trel) is a contraceptive (birth control) device. The device is placed inside the uterus by a healthcare professional. It is used to prevent pregnancy and can also be used to treat heavy bleeding that occurs during your period. Depending on the device, it can be used for 3 to 5 years. This medicine may be used for other purposes; ask your health care provider or pharmacist if you have questions. COMMON BRAND NAME(S): LILETTA, Mirena, Skyla What should I tell my health care provider before I take this medicine? They need to know if you have any of these conditions: -abnormal Pap smear -cancer of the breast, uterus, or cervix -diabetes -endometritis -genital or pelvic infection now or in the past -have more than one sexual partner or your partner has more than one partner -heart disease -history of an ectopic or tubal pregnancy -immune system problems -IUD in place -liver disease or tumor -problems with blood clots or take blood-thinners -use intravenous drugs -uterus of unusual shape -vaginal bleeding that has not been explained -an unusual or allergic reaction to levonorgestrel, other hormones, silicone, or polyethylene, medicines, foods, dyes, or preservatives -pregnant or trying to get pregnant -breast-feeding How should I use this medicine? This device is placed inside the uterus by a health care professional. Talk to your pediatrician regarding the use of this medicine in children. Special care may be needed. Overdosage: If you think you have taken too much of this medicine contact a poison control center or emergency room at once. NOTE: This medicine is only for you. Do not share this medicine with others. What if I miss a dose? This does not apply. What may interact with this medicine? Do not take this medicine with any of the following  medications: -amprenavir -bosentan -fosamprenavir This medicine may also interact with the following medications: -aprepitant -barbiturate medicines for inducing sleep or treating seizures -bexarotene -griseofulvin -medicines to treat seizures like carbamazepine, ethotoin, felbamate, oxcarbazepine, phenytoin, topiramate -modafinil -pioglitazone -rifabutin -rifampin -rifapentine -some medicines to treat HIV infection like atazanavir, indinavir, lopinavir, nelfinavir, tipranavir, ritonavir -St. John's wort -warfarin This list may not describe all possible interactions. Give your health care provider a list of all the medicines, herbs, non-prescription drugs, or dietary supplements you use. Also tell them if you smoke, drink alcohol, or use illegal drugs. Some items may interact with your medicine. What should I watch for while using this medicine? Visit your doctor or health care professional for regular check ups. See your doctor if you or your partner has sexual contact with others, becomes HIV positive, or gets a sexual transmitted disease. This product does not protect you against HIV infection (AIDS) or other sexually transmitted diseases. You can check the placement of the IUD yourself by reaching up to the top of your vagina with clean fingers to feel the threads. Do not pull on the threads. It is a good habit to check placement after each menstrual period. Call your doctor right away if you feel more of the IUD than just the threads or if you cannot feel the threads at all. The IUD may come out by itself. You may become pregnant if the device comes out. If you notice that the IUD has come out use a backup birth control method like condoms and call your health care provider. Using tampons will not change the position of the IUD and are okay to use during your period. What side effects may   I notice from receiving this medicine? Side effects that you should report to your doctor or  health care professional as soon as possible: -allergic reactions like skin rash, itching or hives, swelling of the face, lips, or tongue -fever, flu-like symptoms -genital sores -high blood pressure -no menstrual period for 6 weeks during use -pain, swelling, warmth in the leg -pelvic pain or tenderness -severe or sudden headache -signs of pregnancy -stomach cramping -sudden shortness of breath -trouble with balance, talking, or walking -unusual vaginal bleeding, discharge -yellowing of the eyes or skin Side effects that usually do not require medical attention (report to your doctor or health care professional if they continue or are bothersome): -acne -breast pain -change in sex drive or performance -changes in weight -cramping, dizziness, or faintness while the device is being inserted -headache -irregular menstrual bleeding within first 3 to 6 months of use -nausea This list may not describe all possible side effects. Call your doctor for medical advice about side effects. You may report side effects to FDA at 1-800-FDA-1088. Where should I keep my medicine? This does not apply. NOTE: This sheet is a summary. It may not cover all possible information. If you have questions about this medicine, talk to your doctor, pharmacist, or health care provider.  2015, Elsevier/Gold Standard. (2011-06-10 13:54:04)  

## 2013-12-20 ENCOUNTER — Encounter: Payer: Self-pay | Admitting: Internal Medicine

## 2013-12-20 ENCOUNTER — Encounter: Payer: Self-pay | Admitting: Obstetrics & Gynecology

## 2013-12-20 NOTE — Telephone Encounter (Signed)
New problem   Pt stated she was returning a call from nurse.

## 2013-12-21 NOTE — Telephone Encounter (Signed)
This encounter was created in error - please disregard.

## 2013-12-26 ENCOUNTER — Ambulatory Visit (HOSPITAL_COMMUNITY)
Admission: RE | Admit: 2013-12-26 | Discharge: 2013-12-26 | Disposition: A | Discharge status: Home / Self Care | Payer: Medicaid Other | Source: Ambulatory Visit | Attending: Obstetrics & Gynecology | Admitting: Obstetrics & Gynecology

## 2013-12-26 DIAGNOSIS — Z30431 Encounter for routine checking of intrauterine contraceptive device: Secondary | ICD-10-CM | POA: Diagnosis not present

## 2013-12-26 DIAGNOSIS — D259 Leiomyoma of uterus, unspecified: Secondary | ICD-10-CM

## 2013-12-31 ENCOUNTER — Telehealth: Payer: Self-pay

## 2013-12-31 NOTE — Telephone Encounter (Signed)
Patiet called stating she would like to speak to nurse about results.

## 2013-12-31 NOTE — Telephone Encounter (Signed)
Called patient. Patient requested U/S results. Informed patient U/S showed multiple fibroids though explained they are stable and have not changed since prior U/S. Informed patient she will be able to discuss options as to plan of care next week and follow up appointment. Patient verbalized understanding. No further questions or concerns.

## 2014-01-01 ENCOUNTER — Telehealth: Payer: Self-pay | Admitting: General Practice

## 2014-01-01 NOTE — Telephone Encounter (Signed)
Patient called and left message stating she would like a call back because she has some questions for the nurse about the hair loss she has been having. Called patient, no answer- left message that we are trying to return your phone call, please call us back at the clinics

## 2014-01-03 NOTE — Telephone Encounter (Signed)
Called Chelsea Branch back and we discussed hair loss is likely not related to her gynecology issues. She states she has not started any new meds. I advised her to discuss with her PCP as she has multiple medical issues and is on multiple meds- and if they fill it is due to some of her meds they have prescribed or medical issues or if she needs a referral to dermatology. She also asked for results of her ultrasound and wanted to know when her next appointment with Dr. Roselie Awkward is- gave her results showing fibroids stable and reviewed her next appointmetn date/time with her.

## 2014-01-07 ENCOUNTER — Telehealth: Payer: Self-pay | Admitting: *Deleted

## 2014-01-07 ENCOUNTER — Ambulatory Visit: Payer: Medicaid Other | Admitting: Obstetrics & Gynecology

## 2014-01-07 ENCOUNTER — Encounter (HOSPITAL_COMMUNITY): Payer: Self-pay | Admitting: *Deleted

## 2014-01-07 ENCOUNTER — Inpatient Hospital Stay (HOSPITAL_COMMUNITY)
Admission: AD | Admit: 2014-01-07 | Discharge: 2014-01-07 | Disposition: A | Discharge status: Home / Self Care | Payer: Medicaid Other | Source: Ambulatory Visit | Attending: Obstetrics & Gynecology | Admitting: Obstetrics & Gynecology

## 2014-01-07 DIAGNOSIS — I509 Heart failure, unspecified: Secondary | ICD-10-CM | POA: Diagnosis not present

## 2014-01-07 DIAGNOSIS — I5040 Unspecified combined systolic (congestive) and diastolic (congestive) heart failure: Secondary | ICD-10-CM | POA: Diagnosis not present

## 2014-01-07 DIAGNOSIS — R102 Pelvic and perineal pain: Secondary | ICD-10-CM

## 2014-01-07 DIAGNOSIS — R109 Unspecified abdominal pain: Secondary | ICD-10-CM | POA: Insufficient documentation

## 2014-01-07 DIAGNOSIS — N898 Other specified noninflammatory disorders of vagina: Secondary | ICD-10-CM | POA: Diagnosis not present

## 2014-01-07 DIAGNOSIS — F17200 Nicotine dependence, unspecified, uncomplicated: Secondary | ICD-10-CM | POA: Diagnosis not present

## 2014-01-07 DIAGNOSIS — I10 Essential (primary) hypertension: Secondary | ICD-10-CM | POA: Diagnosis not present

## 2014-01-07 DIAGNOSIS — N938 Other specified abnormal uterine and vaginal bleeding: Secondary | ICD-10-CM | POA: Insufficient documentation

## 2014-01-07 DIAGNOSIS — N949 Unspecified condition associated with female genital organs and menstrual cycle: Secondary | ICD-10-CM | POA: Diagnosis not present

## 2014-01-07 LAB — URINALYSIS, ROUTINE W REFLEX MICROSCOPIC
BILIRUBIN URINE: NEGATIVE
Glucose, UA: NEGATIVE mg/dL
Ketones, ur: NEGATIVE mg/dL
Leukocytes, UA: NEGATIVE
Nitrite: NEGATIVE
PH: 6.5 (ref 5.0–8.0)
Protein, ur: NEGATIVE mg/dL
Specific Gravity, Urine: <1.005 — ABNORMAL LOW (ref 1.005–1.030)
UROBILINOGEN UA: 0.2 mg/dL (ref 0.0–1.0)

## 2014-01-07 LAB — URINE MICROSCOPIC-ADD ON

## 2014-01-07 LAB — WET PREP, GENITAL
CLUE CELLS WET PREP: NONE SEEN
TRICH WET PREP: NONE SEEN
WBC, Wet Prep HPF POC: NONE SEEN
YEAST WET PREP: NONE SEEN

## 2014-01-07 LAB — CBC
HCT: 38.8 % (ref 36.0–46.0)
HEMOGLOBIN: 13 g/dL (ref 12.0–15.0)
MCH: 28.8 pg (ref 26.0–34.0)
MCHC: 33.5 g/dL (ref 30.0–36.0)
MCV: 86 fL (ref 78.0–100.0)
Platelets: 309 10*3/uL (ref 150–400)
RBC: 4.51 MIL/uL (ref 3.87–5.11)
RDW: 14.7 % (ref 11.5–15.5)
WBC: 7.4 10*3/uL (ref 4.0–10.5)

## 2014-01-07 LAB — POCT PREGNANCY, URINE: Preg Test, Ur: NEGATIVE

## 2014-01-07 MED ORDER — KETOROLAC TROMETHAMINE 60 MG/2ML IM SOLN
60.0000 mg | INTRAMUSCULAR | Status: AC
  Administered 2014-01-07: 60 mg via INTRAMUSCULAR
  Filled 2014-01-07: qty 2

## 2014-01-07 NOTE — Discharge Instructions (Signed)
Abdominal Pain, Women °Abdominal (stomach, pelvic, or belly) pain can be caused by many things. It is important to tell your doctor: °· The location of the pain. °· Does it come and go or is it present all the time? °· Are there things that start the pain (eating certain foods, exercise)? °· Are there other symptoms associated with the pain (fever, nausea, vomiting, diarrhea)? °All of this is helpful to know when trying to find the cause of the pain. °CAUSES  °· Stomach: virus or bacteria infection, or ulcer. °· Intestine: appendicitis (inflamed appendix), regional ileitis (Crohn's disease), ulcerative colitis (inflamed colon), irritable bowel syndrome, diverticulitis (inflamed diverticulum of the colon), or cancer of the stomach or intestine. °· Gallbladder disease or stones in the gallbladder. °· Kidney disease, kidney stones, or infection. °· Pancreas infection or cancer. °· Fibromyalgia (pain disorder). °· Diseases of the female organs: °¨ Uterus: fibroid (non-cancerous) tumors or infection. °¨ Fallopian tubes: infection or tubal pregnancy. °¨ Ovary: cysts or tumors. °¨ Pelvic adhesions (scar tissue). °¨ Endometriosis (uterus lining tissue growing in the pelvis and on the pelvic organs). °¨ Pelvic congestion syndrome (female organs filling up with blood just before the menstrual period). °¨ Pain with the menstrual period. °¨ Pain with ovulation (producing an egg). °¨ Pain with an IUD (intrauterine device, birth control) in the uterus. °¨ Cancer of the female organs. °· Functional pain (pain not caused by a disease, may improve without treatment). °· Psychological pain. °· Depression. °DIAGNOSIS  °Your doctor will decide the seriousness of your pain by doing an examination. °· Blood tests. °· X-rays. °· Ultrasound. °· CT scan (computed tomography, special type of X-ray). °· MRI (magnetic resonance imaging). °· Cultures, for infection. °· Barium enema (dye inserted in the large intestine, to better view it with  X-rays). °· Colonoscopy (looking in intestine with a lighted tube). °· Laparoscopy (minor surgery, looking in abdomen with a lighted tube). °· Major abdominal exploratory surgery (looking in abdomen with a large incision). °TREATMENT  °The treatment will depend on the cause of the pain.  °· Many cases can be observed and treated at home. °· Over-the-counter medicines recommended by your caregiver. °· Prescription medicine. °· Antibiotics, for infection. °· Birth control pills, for painful periods or for ovulation pain. °· Hormone treatment, for endometriosis. °· Nerve blocking injections. °· Physical therapy. °· Antidepressants. °· Counseling with a psychologist or psychiatrist. °· Minor or major surgery. °HOME CARE INSTRUCTIONS  °· Do not take laxatives, unless directed by your caregiver. °· Take over-the-counter pain medicine only if ordered by your caregiver. Do not take aspirin because it can cause an upset stomach or bleeding. °· Try a clear liquid diet (broth or water) as ordered by your caregiver. Slowly move to a bland diet, as tolerated, if the pain is related to the stomach or intestine. °· Have a thermometer and take your temperature several times a day, and record it. °· Bed rest and sleep, if it helps the pain. °· Avoid sexual intercourse, if it causes pain. °· Avoid stressful situations. °· Keep your follow-up appointments and tests, as your caregiver orders. °· If the pain does not go away with medicine or surgery, you may try: °¨ Acupuncture. °¨ Relaxation exercises (yoga, meditation). °¨ Group therapy. °¨ Counseling. °SEEK MEDICAL CARE IF:  °· You notice certain foods cause stomach pain. °· Your home care treatment is not helping your pain. °· You need stronger pain medicine. °· You want your IUD removed. °· You feel faint or   lightheaded.  You develop nausea and vomiting.  You develop a rash.  You are having side effects or an allergy to your medicine. SEEK IMMEDIATE MEDICAL CARE IF:   Your  pain does not go away or gets worse.  You have a fever.  Your pain is felt only in portions of the abdomen. The right side could possibly be appendicitis. The left lower portion of the abdomen could be colitis or diverticulitis.  You are passing blood in your stools (bright red or black tarry stools, with or without vomiting).  You have blood in your urine.  You develop chills, with or without a fever.  You pass out. MAKE SURE YOU:   Understand these instructions.  Will watch your condition.  Will get help right away if you are not doing well or get worse. Document Released: 03/07/2007 Document Revised: 09/24/2013 Document Reviewed: 03/27/2009 [facility] Patient Information 2015 [place], [place]. This information is not intended to replace advice given to you by your health care provider. Make sure you discuss any questions you have with your health care provider. Pelvic Pain Pelvic pain is pain felt below the belly button and between your hips. It can be caused by many different things. It is important to get help right away. This is especially true for severe, sharp, or unusual pain that comes on suddenly.  HOME CARE  Only take medicine as told by your doctor.  Rest as told by your doctor.  Eat a healthy diet, such as fruits, vegetables, and lean meats.  Drink enough fluids to keep your pee (urine) clear or pale yellow, or as told.  Avoid sex (intercourse) if it causes pain.  Apply warm or cold packs to your lower belly (abdomen). Use the type of pack that helps the pain.  Avoid situations that cause you stress.  Keep a journal to track your pain. Write down:  When the pain started.  Where it is located.  If there are things that seem to be related to the pain, such as food or your period.  Follow up with your doctor as told. GET HELP RIGHT AWAY IF:   You have heavy bleeding from the vagina.  You have more pelvic pain.  You feel lightheaded or pass out  (faint).  You have chills.  You have pain when you pee or have blood in your pee.  You cannot stop having watery poop (diarrhea).  You cannot stop throwing up (vomiting).  You have a fever or lasting symptoms for more than 3 days.  You have a fever and your symptoms suddenly get worse.  You are being physically or sexually abused.  Your medicine does not help your pain.  You have fluid (discharge) coming from your vagina that is not normal. MAKE SURE YOU:  Understand these instructions.  Will watch your condition.  Will get help if you are not doing well or get worse. Document Released: 10/27/2007 Document Revised: 11/09/2011 Document Reviewed: 08/30/2011 [facility] Patient Information 2015 [address], [place]. This information is not intended to replace advice given to you by your health care provider. Make sure you discuss any questions you have with your health care provider.

## 2014-01-07 NOTE — MAU Note (Signed)
Patient states she has been having vaginal bleeding with abdominal pain since yesterday. Has a history of fibroids.

## 2014-01-07 NOTE — Telephone Encounter (Signed)
Patient called requesting a refill on her Percocet. States that she is having pain. Will send note to Dr. Roselie Awkward for approval.

## 2014-01-07 NOTE — Telephone Encounter (Signed)
Called patient, no answer- left message that we are trying to return your phone call, please call us back at the clinics

## 2014-01-07 NOTE — ED Provider Notes (Deleted)
History      Chelsea Branch is a 48 year old African American female that presents to the maternal admissions unit with the chief complaint of vaginal bleeding and abdominal pain. She states that Dr. Roselie Awkward is responsible for her care. She states that her uterine pain is stabbing and constant. She states she bleeds through 3 pads daily. Needs refill on percocet is the primary reason she is here.   CSN: [id number]  Arrival date and time: 01/07/14 1315   First Provider Initiated Contact with Patient 01/07/14 1551      Chief Complaint  Patient presents with  . Vaginal Bleeding  . Abdominal Pain   Patient is a 48 y.o. female presenting with vaginal bleeding and abdominal pain. The history is provided by the patient. No language interpreter was used.  Vaginal Bleeding This is a recurrent problem. The current episode started 1 to 4 weeks ago. The problem occurs constantly. The problem has been unchanged. Associated symptoms include abdominal pain and nausea. Pertinent negatives include no change in bowel habit, chest pain, chills, fatigue, fever, rash, vertigo, visual change or vomiting. Nothing aggravates the symptoms. She has tried nothing for the symptoms. The treatment provided no relief.  Abdominal Pain The primary symptoms of the illness include abdominal pain, nausea and vaginal bleeding. The primary symptoms of the illness do not include fever, fatigue, vomiting or dysuria.  Symptoms associated with the illness do not include chills.      Past Medical History  Diagnosis Date  . Chronic combined systolic and diastolic CHF (congestive heart failure)     a. 07/2013  EF of 25-30%, LV, mod LVH, diffuse hypokinesis and mild MR  . Hypertension   . MITRAL REGURGITATION 10/15/2008  . DYSLIPIDEMIA 06/08/2010  . 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 Alopecia   . Nonischemic cardiomyopathy     iniatially presumed 2/2 peripartum with improvement and then worsening - EF 25-30% by echo 07/2013.   Marland Kitchen Noncompliance   . Tobacco abuse   . Fibroids   . Abnormal uterine bleeding   . Stroke   . Menorrhagia   . Peripartum cardiomyopathy     Past Surgical History  Procedure Laterality Date  . Loop recorder implant  ~ 2000  . Cesarean section  1992  1994  . Tubal ligation  1994  . Loop recorder explant      Family History  Problem Relation Age of Onset  . Other Neg Hx   . Cancer Maternal Grandmother     uterine  . Hypertension Sister     History  Substance Use Topics  . Smoking status: Current Every Day Smoker -- 0.12 packs/day for 30 years    Types: Cigarettes  . Smokeless tobacco: Never Used  . Alcohol Use: No    Allergies:  Allergies  Allergen Reactions  . Ace Inhibitors     REACTION: Cough    Prescriptions prior to admission  Medication Sig Dispense Refill  . furosemide (LASIX) 40 MG tablet Take 1 tablet (40 mg total) by mouth daily.  30 tablet  2  . hydrALAZINE (APRESOLINE) 25 MG tablet Take 1 tablet (25 mg total) by mouth 2 (two) times daily.  180 tablet  3  . ibuprofen (ADVIL,MOTRIN) 200 MG tablet Take 400 mg by mouth every 6 (six) hours as needed for moderate pain.      Marland Kitchen losartan (COZAAR) 25 MG tablet Take 1 tablet (25 mg total) by mouth 2 (two) times daily.  60 tablet  6  .  oxyCODONE-acetaminophen (PERCOCET/ROXICET) 5-325 MG per tablet Take 2 tablets by mouth every 4 (four) hours as needed for severe pain.      . potassium chloride SA (K-DUR,KLOR-CON) 20 MEQ tablet Take 1 tablet (20 mEq total) by mouth 2 (two) times daily.  60 tablet  5  . simvastatin (ZOCOR) 10 MG tablet Take 10 mg by mouth daily.      Marland Kitchen spironolactone (ALDACTONE) 25 MG tablet Take 1 tablet (25 mg total) by mouth daily.  30 tablet  11    Review of Systems  Constitutional: Negative for fever, chills and fatigue.  Eyes: Negative for blurred vision and double vision.  Cardiovascular: Negative for chest pain.  Gastrointestinal: Positive for nausea and abdominal pain. Negative for vomiting  and change in bowel habit.  Genitourinary: Positive for vaginal bleeding. Negative for dysuria.  Skin: Negative for rash.  Neurological: Negative for dizziness, vertigo and tingling.   Physical Exam   Blood pressure 104/79, pulse 94, temperature 98.9 F (37.2 C), temperature source Oral, resp. rate 20, last menstrual period 01/05/2014, SpO2 100.00%.  Physical Exam  Constitutional: She is oriented to person, place, and time. She appears well-developed and well-nourished.  Cardiovascular: Normal rate, regular rhythm, normal heart sounds and intact distal pulses.   Respiratory: Effort normal and breath sounds normal. No respiratory distress. She has no wheezes. She has no rales. She exhibits no tenderness.  Neurological: She is alert and oriented to person, place, and time.  Pelvic: External genitalia appears normal without any evidence of lesions. Normal hair distribution Speculum: Bleeding found on the left and right vaginal walls. Vaginal walls are erythematous   Results for orders placed during the hospital encounter of 01/07/14 (from the past 24 hour(s))  CBC     Status: None   Collection Time    01/07/14  1:47 PM      Result Value Ref Range   WBC 7.4  4.0 - 10.5 K/uL   RBC 4.51  3.87 - 5.11 MIL/uL   Hemoglobin 13.0  12.0 - 15.0 g/dL   HCT 38.8  36.0 - 46.0 %   MCV 86.0  78.0 - 100.0 fL   MCH 28.8  26.0 - 34.0 pg   MCHC 33.5  30.0 - 36.0 g/dL   RDW 14.7  11.5 - 15.5 %   Platelets 309  150 - 400 K/uL  URINALYSIS, ROUTINE W REFLEX MICROSCOPIC     Status: Abnormal   Collection Time    01/07/14  1:55 PM      Result Value Ref Range   Color, Urine YELLOW  YELLOW   APPearance CLEAR  CLEAR   Specific Gravity, Urine <1.005 (*) 1.005 - 1.030   pH 6.5  5.0 - 8.0   Glucose, UA NEGATIVE  NEGATIVE mg/dL   Hgb urine dipstick LARGE (*) NEGATIVE   Bilirubin Urine NEGATIVE  NEGATIVE   Ketones, ur NEGATIVE  NEGATIVE mg/dL   Protein, ur NEGATIVE  NEGATIVE mg/dL   Urobilinogen, UA 0.2  0.0  - 1.0 mg/dL   Nitrite NEGATIVE  NEGATIVE   Leukocytes, UA NEGATIVE  NEGATIVE  URINE MICROSCOPIC-ADD ON     Status: Abnormal   Collection Time    01/07/14  1:55 PM      Result Value Ref Range   Squamous Epithelial / LPF MANY (*) RARE   WBC, UA 3-6  <3 WBC/hpf   RBC / HPF 7-10  <3 RBC/hpf   Bacteria, UA MANY (*) RARE   Urine-Other MUCOUS PRESENT  POCT PREGNANCY, URINE     Status: None   Collection Time    01/07/14  2:08 PM      Result Value Ref Range   Preg Test, Ur NEGATIVE  NEGATIVE  WET PREP, GENITAL     Status: None   Collection Time    01/07/14  4:20 PM      Result Value Ref Range   Yeast Wet Prep HPF POC NONE SEEN  NONE SEEN   Trich, Wet Prep NONE SEEN  NONE SEEN   Clue Cells Wet Prep HPF POC NONE SEEN  NONE SEEN   WBC, Wet Prep HPF POC NONE SEEN  NONE SEEN     MAU Course  Procedures None  MDM No sign of infection  Assessment and Plan  Assessment:  Pelvic pain - ?due to fibroids  Plan: Discharge to home Toradol IM given for pain management while in MAU. Continue care at the Gynecologic clinic at the [facility]  MAU for emergencies Naoma Diener 01/07/2014, 5:03 PM  Naoma Diener 01/07/2014, 5:03 PM  I have seen and evaluated the patient with the PA student. I agree with the assessment and plan as written above.   Paticia Stack, PA-C  01/07/2014 5:38 PM

## 2014-01-08 ENCOUNTER — Ambulatory Visit (INDEPENDENT_AMBULATORY_CARE_PROVIDER_SITE_OTHER): Payer: Medicaid Other | Admitting: Pulmonary Disease

## 2014-01-08 ENCOUNTER — Encounter: Payer: Self-pay | Admitting: Pulmonary Disease

## 2014-01-08 VITALS — BP 116/72 | HR 92 | Ht 68.0 in | Wt 208.4 lb

## 2014-01-08 DIAGNOSIS — G4733 Obstructive sleep apnea (adult) (pediatric): Secondary | ICD-10-CM | POA: Insufficient documentation

## 2014-01-08 LAB — GC/CHLAMYDIA PROBE AMP
CT Probe RNA: NEGATIVE
GC PROBE AMP APTIMA: NEGATIVE

## 2014-01-08 NOTE — Telephone Encounter (Signed)
Called patient and informed her we are awaiting response from Dr. Roselie Awkward, Advised she take 800mg  ibuprofen 3X daily for now as needed. Patient states ibuprofen does not help at all. Informed patient that is all we can recommend at this time and that we will call her once we hear from Dr. Roselie Awkward. Patient verbalized understanding. No further question or concerns.

## 2014-01-08 NOTE — Assessment & Plan Note (Signed)
The patient has moderate obstructive sleep apnea by her recent sleep study, and I have had a long discussion with her about the pathophysiology of sleep apnea.  I stressed to her the impact to her quality of life and cardiovascular health, and the importance of treating this since she has significant underlying cardiac issues. I have asked her to consider a trial of CPAP, while she is working on weight loss. She is willing to do this. I will set the patient up on cpap at a moderate pressure level to allow for desensitization, and will troubleshoot the device over the next 4-6weeks if needed.  The pt is to call me if having issues with tolerance.  Will then optimize the pressure once patient is able to wear cpap on a consistent basis.

## 2014-01-08 NOTE — Progress Notes (Signed)
Subjective:    [name], female    DOB: 1965-09-02, 48 y.o.   MRN: [id number]  HPI The patient is a 48 year old female who I've been asked to see for management of obstructive sleep apnea. She had a recent sleep study that showed an AHI of 18 events per hour, and an RDI of 30 events per hour. The patient has been noted to have loud snoring, as well as an abnormal breathing pattern during sleep. She has frequent awakenings at night, and is not rested in the mornings upon arising. She does note sleepiness during the day with periods of inactivity, and has no issues driving short distances. She tells me that she does not drive longer distances. The patient's weight is up about 20 pounds over the last one year, and her Epworth score today is 10   Sleep Questionnaire What time do you typically go to bed?( Between what hours) 10pm 10pm at 1521 on 01/08/14 by Lilli Few, CMA How long does it take you to fall asleep? 1 hour 1 hour at 1521 on 01/08/14 by Lilli Few, CMA How many times during the night do you wake up? 4 4 at 1521 on 01/08/14 by Lilli Few, CMA What time do you get out of bed to start your day? 0700 0700 at 1521 on 01/08/14 by Lilli Few, CMA Do you drive or operate heavy machinery in your occupation? No No at 1521 on 01/08/14 by Lilli Few, CMA How much has your weight changed (up or down) over the past two years? (In pounds) 20 lb (9.072 kg) 20 lb (9.072 kg) at 1521 on 01/08/14 by Lilli Few, CMA Have you ever had a sleep study before? Yes Yes at 1521 on 01/08/14 by Lilli Few, CMA If yes, location of study? [place] at 1521 on 01/08/14 by Lilli Few, CMA If yes, date of study? 5 -2015 5 -2015 at 1521 on 01/08/14 by Lilli Few, CMA Do you currently use CPAP? No No at 1521 on 01/08/14 by Lilli Few, CMA Do you wear oxygen at any time? No No at 1521  on 01/08/14 by Lilli Few, CMA   Review of Systems  Constitutional: Negative for fever and unexpected weight change.  HENT: Negative for congestion, dental problem, ear pain, nosebleeds, postnasal drip, rhinorrhea, sinus pressure, sneezing, sore throat and trouble swallowing.   Eyes: Negative for redness and itching.  Respiratory: Positive for shortness of breath. Negative for cough, chest tightness and wheezing.   Cardiovascular: Positive for leg swelling. Negative for palpitations.  Gastrointestinal: Positive for abdominal pain. Negative for nausea and vomiting.  Genitourinary: Negative for dysuria.  Musculoskeletal: Negative for joint swelling.  Skin: Negative for rash.  Neurological: Negative for headaches.  Hematological: Does not bruise/bleed easily.  Psychiatric/Behavioral: Negative for dysphoric mood. The patient is not nervous/anxious.        Objective:   Physical Exam Constitutional:  Overweight female, no acute distress  HENT:  Nares patent without discharge  Oropharynx without exudate, palate and uvula are very thick and elongated.  Eyes:  Perrla, eomi, no scleral icterus  Neck:  No JVD, no TMG  Cardiovascular:  Normal rate, regular rhythm, no rubs or gallops.  No murmurs        Intact distal pulses  Pulmonary :  Normal breath sounds, no stridor or respiratory distress   No rales, rhonchi, or wheezing  Abdominal:  Soft, nondistended, bowel sounds  present.  No tenderness noted.   Musculoskeletal:  mild lower extremity edema noted.  Lymph Nodes:  No cervical lymphadenopathy noted  Skin:  No cyanosis noted  Neurologic:  Alert, appropriate, moves all 4 extremities without obvious deficit.         Assessment & Plan:

## 2014-01-08 NOTE — Telephone Encounter (Addendum)
Patient returned call stating she was seen in MAU yesterday and was given a shot for her pain, however, they told her she would need to speak to Dr. Roselie Awkward about getting RX for percocet to get her to her next appointment. Would like a call back. Patient's f/u appointment 01/11/14. Awaiting response from Dr. Roselie Awkward.

## 2014-01-08 NOTE — Patient Instructions (Signed)
Will start on cpap at a moderate pressure level.  Please call if having issues with tolerance. Work on weight loss.  followup with me again in 8 weeks.

## 2014-01-09 ENCOUNTER — Other Ambulatory Visit: Payer: Self-pay | Admitting: Obstetrics & Gynecology

## 2014-01-09 MED ORDER — OXYCODONE-ACETAMINOPHEN 5-325 MG PO TABS: 1.0000 | ORAL_TABLET | Freq: Four times a day (QID) | ORAL | Status: DC | PRN

## 2014-01-09 NOTE — Telephone Encounter (Signed)
Patient called and left message stating she is having heavy bleeding and wants to know if she needs to go to the ER or not and she is still waiting on a refill on her pain medication. Left call back number [phone]. Called patient and informed her of Rx refill and prescription waiting at front office. Patient verbalized understanding. Asked patient how much bleeding she has been having. Patient states 3-4 pads a day. Told patient that was okay as long as she wasn't having heavy bleeding to the point of saturating a pad in less than an hour. Patient verbalized understanding. Reminded patient of 8/21 @ 9:15 appt. Patient verbalized understanding and had no other questions

## 2014-01-11 ENCOUNTER — Encounter: Payer: Self-pay | Admitting: General Practice

## 2014-01-11 ENCOUNTER — Ambulatory Visit (INDEPENDENT_AMBULATORY_CARE_PROVIDER_SITE_OTHER): Payer: Medicaid Other | Admitting: Obstetrics & Gynecology

## 2014-01-11 ENCOUNTER — Encounter: Payer: Self-pay | Admitting: Obstetrics & Gynecology

## 2014-01-11 VITALS — BP 113/69 | HR 85 | Temp 98.4°F | Wt 207.8 lb

## 2014-01-11 DIAGNOSIS — N920 Excessive and frequent menstruation with regular cycle: Secondary | ICD-10-CM

## 2014-01-11 DIAGNOSIS — D259 Leiomyoma of uterus, unspecified: Secondary | ICD-10-CM

## 2014-01-11 DIAGNOSIS — N921 Excessive and frequent menstruation with irregular cycle: Secondary | ICD-10-CM

## 2014-01-11 MED ORDER — LEVONORGESTREL 20 MCG/24HR IU IUD
INTRAUTERINE_SYSTEM | Freq: Once | INTRAUTERINE | Status: AC
Start: 2014-01-11 — End: 2014-01-11
  Administered 2014-01-11: 1 via INTRAUTERINE

## 2014-01-11 NOTE — Progress Notes (Signed)
Subjective:     [name], female   DOB: 1965/12/22, 48 y.o.   MRN: [id number]  HPI Patient's last menstrual period was 01/05/2014. [id number] [place] confirmed IUD fell out. Discussed that surgical options are not good for her as she has significant CV disease, agrees to replace Mirena today. The risks and benefits were discussed.   Review of Systems  Constitutional: Negative.   Respiratory: Negative.   Cardiovascular: Negative.   Genitourinary: Positive for menstrual problem and pelvic pain.       Objective:   Physical Exam  Constitutional: She appears well-developed. No distress.  Genitourinary: Vaginal discharge (dark blood) found.  Skin: Skin is warm and dry.  Psychiatric: She has a normal mood and affect. Her behavior is normal.       Assessment:     Menometrorrhagia, fibroids, H/O CHF and HTN, not a good surgical candidate   Patient identified, informed consent performed, signed copy in chart, time out was performed.  Urine pregnancy test negative.  Speculum placed in the vagina.  Cervix visualized.  Cleaned with Betadine x 2.  Grasped anteriourly with a single tooth tenaculum.  Uterus sounded to 10 cm.  Mirena IUD placed per manufacturer's recommendations.  Strings trimmed to 3 cm.   Patient given post procedure instructions and Mirena care card with expiration date.  Patient is asked to check IUD strings periodically and follow up in 4-6 weeks for IUD check. Woodroe Mode, MD 01/11/2014

## 2014-01-11 NOTE — Patient Instructions (Signed)
Intrauterine Device Insertion, Care After Refer to this sheet in the next few weeks. These instructions provide you with information on caring for yourself after your procedure. Your health care provider may also give you more specific instructions. Your treatment has been planned according to current medical practices, but problems sometimes occur. Call your health care provider if you have any problems or questions after your procedure. WHAT TO EXPECT AFTER THE PROCEDURE Insertion of the IUD may cause some discomfort, such as cramping. The cramping should improve after the IUD is in place. You may have bleeding after the procedure. This is normal. It varies from light spotting for a few days to menstrual-like bleeding. When the IUD is in place, a string will extend past the cervix into the vagina for 1-2 inches. The strings should not bother you or your partner. If they do, talk to your health care provider.  HOME CARE INSTRUCTIONS   Check your intrauterine device (IUD) to make sure it is in place before you resume sexual activity. You should be able to feel the strings. If you cannot feel the strings, something may be wrong. The IUD may have fallen out of the uterus, or the uterus may have been punctured (perforated) during placement. Also, if the strings are getting longer, it may mean that the IUD is being forced out of the uterus. You no longer have full protection from pregnancy if any of these problems occur.  You may resume sexual intercourse if you are not having problems with the IUD. The copper IUD is considered immediately effective, and the hormone IUD works right away if inserted within 7 days of your period starting. You will need to use a backup method of birth control for 7 days if the IUD in inserted at any other time in your cycle.  Continue to check that the IUD is still in place by feeling for the strings after every menstrual period.  You may need to take pain medicine such as  acetaminophen or ibuprofen. Only take medicines as directed by your health care provider. SEEK MEDICAL CARE IF:   You have bleeding that is heavier or lasts longer than a normal menstrual cycle.  You have a fever.  You have increasing cramps or abdominal pain not relieved with medicine.  You have abdominal pain that does not seem to be related to the same area of earlier cramping and pain.  You are lightheaded, unusually weak, or faint.  You have abnormal vaginal discharge or smells.  You have pain during sexual intercourse.  You cannot feel the IUD strings, or the IUD string has gotten longer.  You feel the IUD at the opening of the cervix in the vagina.  You think you are pregnant, or you miss your menstrual period.  The IUD string is hurting your sex partner. MAKE SURE YOU:  Understand these instructions.  Will watch your condition.  Will get help right away if you are not doing well or get worse. Document Released: 01/06/2011 Document Revised: 02/28/2013 Document Reviewed: 10/29/2012 ExitCare Patient Information 2015 [employer]. This information is not intended to replace advice given to you by your health care provider. Make sure you discuss any questions you have with your health care provider.  

## 2014-01-11 NOTE — Progress Notes (Signed)
Pt is concerned about weight gain, hair loss.  Pt wants to discuss options.

## 2014-01-14 ENCOUNTER — Ambulatory Visit: Payer: Medicaid Other | Admitting: Obstetrics & Gynecology

## 2014-01-15 ENCOUNTER — Other Ambulatory Visit: Payer: Self-pay | Admitting: Internal Medicine

## 2014-01-15 ENCOUNTER — Other Ambulatory Visit: Payer: Self-pay

## 2014-01-15 MED ORDER — FUROSEMIDE 40 MG PO TABS: 40.0000 mg | ORAL_TABLET | Freq: Every day | ORAL | Status: DC

## 2014-01-17 NOTE — MAU Provider Note (Signed)
History    Chelsea Branch is a 48 year old African American female that presents to the maternal admissions unit with the chief complaint of vaginal bleeding and abdominal pain. She states that Dr. Roselie Awkward is responsible for her care. She states that her uterine pain is stabbing and constant. She states she bleeds through 3 pads daily. Needs refill on percocet is the primary reason she is here.  CSN: [id number]  Arrival date and time: 01/07/14 1315  First Provider Initiated Contact with Patient 01/07/14 1551  Chief Complaint   Patient presents with   .  Vaginal Bleeding   .  Abdominal Pain    Patient is a 48 y.o. female presenting with vaginal bleeding and abdominal pain. The history is provided by the patient. No language interpreter was used.  Vaginal Bleeding  This is a recurrent problem. The current episode started 1 to 4 weeks ago. The problem occurs constantly. The problem has been unchanged. Associated symptoms include abdominal pain and nausea. Pertinent negatives include no change in bowel habit, chest pain, chills, fatigue, fever, rash, vertigo, visual change or vomiting. Nothing aggravates the symptoms. She has tried nothing for the symptoms. The treatment provided no relief.  Abdominal Pain  The primary symptoms of the illness include abdominal pain, nausea and vaginal bleeding. The primary symptoms of the illness do not include fever, fatigue, vomiting or dysuria.  Symptoms associated with the illness do not include chills.   Past Medical History   Diagnosis  Date   .  Chronic combined systolic and diastolic CHF (congestive heart failure)      a. 07/2013 EF of 25-30%, LV, mod LVH, diffuse hypokinesis and mild MR   .  Hypertension    .  MITRAL REGURGITATION  10/15/2008   .  DYSLIPIDEMIA  06/08/2010   .  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  Alopecia    .  Nonischemic cardiomyopathy      iniatially presumed 2/2 peripartum with improvement and then worsening - EF 25-30% by echo  07/2013.   Marland Kitchen  Noncompliance    .  Tobacco abuse    .  Fibroids    .  Abnormal uterine bleeding    .  Stroke    .  Menorrhagia    .  Peripartum cardiomyopathy     Past Surgical History   Procedure  Laterality  Date   .  Loop recorder implant   ~ 2000   .  Cesarean section   1992 1994   .  Tubal ligation   1994   .  Loop recorder explant      Family History   Problem  Relation  Age of Onset   .  Other  Neg Hx    .  Cancer  Maternal Grandmother      uterine   .  Hypertension  Sister     History   Substance Use Topics   .  Smoking status:  Current Every Day Smoker -- 0.12 packs/day for 30 years     Types:  Cigarettes   .  Smokeless tobacco:  Never Used   .  Alcohol Use:  No    Allergies:  Allergies   Allergen  Reactions   .  Ace Inhibitors      REACTION: Cough    Prescriptions prior to admission   Medication  Sig  Dispense  Refill   .  furosemide (LASIX) 40 MG tablet  Take 1 tablet (40 mg total) by  mouth daily.  30 tablet  2   .  hydrALAZINE (APRESOLINE) 25 MG tablet  Take 1 tablet (25 mg total) by mouth 2 (two) times daily.  180 tablet  3   .  ibuprofen (ADVIL,MOTRIN) 200 MG tablet  Take 400 mg by mouth every 6 (six) hours as needed for moderate pain.     Marland Kitchen  losartan (COZAAR) 25 MG tablet  Take 1 tablet (25 mg total) by mouth 2 (two) times daily.  60 tablet  6   .  oxyCODONE-acetaminophen (PERCOCET/ROXICET) 5-325 MG per tablet  Take 2 tablets by mouth every 4 (four) hours as needed for severe pain.     .  potassium chloride SA (K-DUR,KLOR-CON) 20 MEQ tablet  Take 1 tablet (20 mEq total) by mouth 2 (two) times daily.  60 tablet  5   .  simvastatin (ZOCOR) 10 MG tablet  Take 10 mg by mouth daily.     Marland Kitchen  spironolactone (ALDACTONE) 25 MG tablet  Take 1 tablet (25 mg total) by mouth daily.  30 tablet  11    Review of Systems  Constitutional: Negative for fever, chills and fatigue.  Eyes: Negative for blurred vision and double vision.  Cardiovascular: Negative for chest pain.   Gastrointestinal: Positive for nausea and abdominal pain. Negative for vomiting and change in bowel habit.  Genitourinary: Positive for vaginal bleeding. Negative for dysuria.  Skin: Negative for rash.  Neurological: Negative for dizziness, vertigo and tingling.   Physical Exam   Blood pressure 104/79, pulse 94, temperature 98.9 F (37.2 C), temperature source Oral, resp. rate 20, last menstrual period 01/05/2014, SpO2 100.00%.  Physical Exam  Constitutional: She is oriented to person, place, and time. She appears well-developed and well-nourished.  Cardiovascular: Normal rate, regular rhythm, normal heart sounds and intact distal pulses.  Respiratory: Effort normal and breath sounds normal. No respiratory distress. She has no wheezes. She has no rales. She exhibits no tenderness.  Neurological: She is alert and oriented to person, place, and time.  Pelvic: External genitalia appears normal without any evidence of lesions. Normal hair distribution  Speculum: Bleeding found on the left and right vaginal walls. Vaginal walls are erythematous   Results for orders placed during the hospital encounter of 01/07/14 (from the past 24 hour(s))   CBC Status: None    Collection Time    01/07/14 1:47 PM   Result  Value  Ref Range    WBC  7.4  4.0 - 10.5 K/uL    RBC  4.51  3.87 - 5.11 MIL/uL    Hemoglobin  13.0  12.0 - 15.0 g/dL    HCT  38.8  36.0 - 46.0 %    MCV  86.0  78.0 - 100.0 fL    MCH  28.8  26.0 - 34.0 pg    MCHC  33.5  30.0 - 36.0 g/dL    RDW  14.7  11.5 - 15.5 %    Platelets  309  150 - 400 K/uL   URINALYSIS, ROUTINE W REFLEX MICROSCOPIC Status: Abnormal    Collection Time    01/07/14 1:55 PM   Result  Value  Ref Range    Color, Urine  YELLOW  YELLOW    APPearance  CLEAR  CLEAR    Specific Gravity, Urine  <1.005 (*)  1.005 - 1.030    pH  6.5  5.0 - 8.0    Glucose, UA  NEGATIVE  NEGATIVE mg/dL    Hgb urine dipstick  LARGE (*)  NEGATIVE    Bilirubin Urine  NEGATIVE  NEGATIVE     Ketones, ur  NEGATIVE  NEGATIVE mg/dL    Protein, ur  NEGATIVE  NEGATIVE mg/dL    Urobilinogen, UA  0.2  0.0 - 1.0 mg/dL    Nitrite  NEGATIVE  NEGATIVE    Leukocytes, UA  NEGATIVE  NEGATIVE   URINE MICROSCOPIC-ADD ON Status: Abnormal    Collection Time    01/07/14 1:55 PM   Result  Value  Ref Range    Squamous Epithelial / LPF  MANY (*)  RARE    WBC, UA  3-6  <3 WBC/hpf    RBC / HPF  7-10  <3 RBC/hpf    Bacteria, UA  MANY (*)  RARE    Urine-Other  MUCOUS PRESENT    POCT PREGNANCY, URINE Status: None    Collection Time    01/07/14 2:08 PM   Result  Value  Ref Range    Preg Test, Ur  NEGATIVE  NEGATIVE   WET PREP, GENITAL Status: None    Collection Time    01/07/14 4:20 PM   Result  Value  Ref Range    Yeast Wet Prep HPF POC  NONE SEEN  NONE SEEN    Trich, Wet Prep  NONE SEEN  NONE SEEN    Clue Cells Wet Prep HPF POC  NONE SEEN  NONE SEEN    WBC, Wet Prep HPF POC  NONE SEEN  NONE SEEN    MAU Course   Procedures  None  MDM  No sign of infection  Assessment and Plan   Assessment: Pelvic pain - ?due to fibroids  Plan: Discharge to home  Toradol IM given for pain management while in MAU.  Continue care at the Gynecologic clinic at the [facility]  MAU for emergencies   Naoma Diener  01/07/2014, 5:03 PM  Naoma Diener  01/07/2014, 5:03 PM   I have seen and evaluated the patient with the PA student. I agree with the assessment and plan as written above.  Paticia Stack, PA-C  01/07/2014 5:38 PM

## 2014-01-18 ENCOUNTER — Other Ambulatory Visit (HOSPITAL_COMMUNITY): Payer: Self-pay | Admitting: Family Medicine

## 2014-01-18 DIAGNOSIS — I5022 Chronic systolic (congestive) heart failure: Secondary | ICD-10-CM

## 2014-01-21 ENCOUNTER — Ambulatory Visit (HOSPITAL_COMMUNITY)
Admission: RE | Admit: 2014-01-21 | Discharge: 2014-01-21 | Disposition: A | Discharge status: Home / Self Care | Payer: Medicaid Other | Source: Ambulatory Visit | Attending: Cardiology | Admitting: Cardiology

## 2014-01-21 DIAGNOSIS — I428 Other cardiomyopathies: Secondary | ICD-10-CM | POA: Insufficient documentation

## 2014-01-21 DIAGNOSIS — Z8673 Personal history of transient ischemic attack (TIA), and cerebral infarction without residual deficits: Secondary | ICD-10-CM | POA: Insufficient documentation

## 2014-01-21 DIAGNOSIS — I10 Essential (primary) hypertension: Secondary | ICD-10-CM | POA: Insufficient documentation

## 2014-01-21 DIAGNOSIS — I517 Cardiomegaly: Secondary | ICD-10-CM | POA: Insufficient documentation

## 2014-01-21 DIAGNOSIS — G4733 Obstructive sleep apnea (adult) (pediatric): Secondary | ICD-10-CM | POA: Diagnosis not present

## 2014-01-21 DIAGNOSIS — I509 Heart failure, unspecified: Secondary | ICD-10-CM | POA: Insufficient documentation

## 2014-01-21 DIAGNOSIS — E785 Hyperlipidemia, unspecified: Secondary | ICD-10-CM | POA: Diagnosis not present

## 2014-01-21 DIAGNOSIS — I5022 Chronic systolic (congestive) heart failure: Secondary | ICD-10-CM

## 2014-01-21 NOTE — Progress Notes (Signed)
2D Echocardiogram Complete.  01/21/2014   Camri Molloy, RDCS

## 2014-01-24 ENCOUNTER — Encounter (HOSPITAL_COMMUNITY): Payer: Self-pay | Admitting: *Deleted

## 2014-01-24 ENCOUNTER — Inpatient Hospital Stay (HOSPITAL_COMMUNITY)
Admission: AD | Admit: 2014-01-24 | Discharge: 2014-01-24 | Disposition: A | Discharge status: Home / Self Care | Payer: Medicaid Other | Source: Ambulatory Visit | Attending: Obstetrics & Gynecology | Admitting: Obstetrics & Gynecology

## 2014-01-24 ENCOUNTER — Telehealth: Payer: Self-pay | Admitting: *Deleted

## 2014-01-24 DIAGNOSIS — N920 Excessive and frequent menstruation with regular cycle: Secondary | ICD-10-CM

## 2014-01-24 DIAGNOSIS — N925 Other specified irregular menstruation: Secondary | ICD-10-CM | POA: Diagnosis not present

## 2014-01-24 DIAGNOSIS — D259 Leiomyoma of uterus, unspecified: Secondary | ICD-10-CM | POA: Diagnosis not present

## 2014-01-24 DIAGNOSIS — N949 Unspecified condition associated with female genital organs and menstrual cycle: Secondary | ICD-10-CM | POA: Insufficient documentation

## 2014-01-24 DIAGNOSIS — Z3040 Encounter for surveillance of contraceptives, unspecified: Secondary | ICD-10-CM

## 2014-01-24 DIAGNOSIS — N938 Other specified abnormal uterine and vaginal bleeding: Secondary | ICD-10-CM | POA: Insufficient documentation

## 2014-01-24 DIAGNOSIS — N946 Dysmenorrhea, unspecified: Secondary | ICD-10-CM | POA: Insufficient documentation

## 2014-01-24 DIAGNOSIS — Z30431 Encounter for routine checking of intrauterine contraceptive device: Secondary | ICD-10-CM | POA: Insufficient documentation

## 2014-01-24 DIAGNOSIS — R109 Unspecified abdominal pain: Secondary | ICD-10-CM | POA: Insufficient documentation

## 2014-01-24 DIAGNOSIS — R1030 Lower abdominal pain, unspecified: Secondary | ICD-10-CM

## 2014-01-24 LAB — URINE MICROSCOPIC-ADD ON

## 2014-01-24 LAB — CBC
HCT: 36.3 % (ref 36.0–46.0)
Hemoglobin: 12 g/dL (ref 12.0–15.0)
MCH: 28 pg (ref 26.0–34.0)
MCHC: 33.1 g/dL (ref 30.0–36.0)
MCV: 84.8 fL (ref 78.0–100.0)
Platelets: 277 10*3/uL (ref 150–400)
RBC: 4.28 MIL/uL (ref 3.87–5.11)
RDW: 14.5 % (ref 11.5–15.5)
WBC: 7.3 10*3/uL (ref 4.0–10.5)

## 2014-01-24 LAB — URINALYSIS, ROUTINE W REFLEX MICROSCOPIC
Bilirubin Urine: NEGATIVE
Glucose, UA: NEGATIVE mg/dL
Ketones, ur: NEGATIVE mg/dL
NITRITE: NEGATIVE
PH: 5 (ref 5.0–8.0)
Protein, ur: NEGATIVE mg/dL
Specific Gravity, Urine: 1.015 (ref 1.005–1.030)
Urobilinogen, UA: 0.2 mg/dL (ref 0.0–1.0)

## 2014-01-24 LAB — POCT PREGNANCY, URINE: Preg Test, Ur: NEGATIVE

## 2014-01-24 MED ORDER — KETOROLAC TROMETHAMINE 10 MG PO TABS: 10.0000 mg | ORAL_TABLET | Freq: Four times a day (QID) | ORAL | Status: DC | PRN

## 2014-01-24 NOTE — MAU Provider Note (Signed)
History     CSN: [id number]  Arrival date and time: 01/24/14 1439   None     Chief Complaint  Patient presents with  . Vaginal Bleeding  . Abdominal Pain   HPI  Pt, Chelsea Branch, is a 48 yo AA female who presents today with the chief complaint of heavy bleeding and abdominal/back pain since the onset of her period. Pt has a history of uterine fibroids confirmed by multiple [place]. Pain is usually a 7/10 (10/10 at its worst) and is cramp -like in nature present in the lower abdomen, inner thighs, and lower back. Pt has been going through 3-4 super absorbant pads each day of her period and states that she is frequently needing to clean herself. Periods are usually 7-10 days.Pt states for years her periods have been heavy with increasing cramping pain. Pt states that she was  diagnosed with fibroids about a year ago.   Pt has had Mirena was placed on 01/11/14.  She has not had significant change since that time.  Prior to this, she had an IUD that provided symptom relief for 4-6 months before it was expelled.    Pt has had conversations with her doctor about possible hysterectomy, but this idea was tabled secondary to her coexisting heart failure.    OB History   Grav Para Term Preterm Abortions TAB SAB Ect Mult Living   2 2 2      1 3       Past Medical History  Diagnosis Date  . Chronic combined systolic and diastolic CHF (congestive heart failure)     a. 07/2013  EF of 25-30%, LV, mod LVH, diffuse hypokinesis and mild MR  . Hypertension   . MITRAL REGURGITATION 10/15/2008  . DYSLIPIDEMIA 06/08/2010  . 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 Alopecia   . Nonischemic cardiomyopathy     iniatially presumed 2/2 peripartum with improvement and then worsening - EF 25-30% by echo 07/2013.  Marland Kitchen Noncompliance   . Tobacco abuse   . Fibroids   . Abnormal uterine bleeding   . Stroke   . Menorrhagia   . Peripartum cardiomyopathy     Past Surgical History  Procedure Laterality Date  . Loop  recorder implant  ~ 2000  . Cesarean section  1992  1994  . Tubal ligation  1994  . Loop recorder explant      Family History  Problem Relation Age of Onset  . Other Neg Hx   . Cancer Maternal Grandmother     uterine  . Hypertension Sister     History  Substance Use Topics  . Smoking status: Former Smoker -- 0.12 packs/day for 30 years    Types: Cigarettes    Quit date: 05/24/2010  . Smokeless tobacco: Never Used  . Alcohol Use: No    Allergies:  Allergies  Allergen Reactions  . Ace Inhibitors     REACTION: Cough    Prescriptions prior to admission  Medication Sig Dispense Refill  . furosemide (LASIX) 40 MG tablet Take 1 tablet (40 mg total) by mouth daily.  30 tablet  6  . hydrALAZINE (APRESOLINE) 25 MG tablet Take 1 tablet (25 mg total) by mouth 2 (two) times daily.  180 tablet  3  . losartan (COZAAR) 25 MG tablet Take 1 tablet (25 mg total) by mouth 2 (two) times daily.  60 tablet  6  . omeprazole (PRILOSEC) 20 MG capsule Take 20 mg by mouth daily.      . potassium  chloride SA (K-DUR,KLOR-CON) 20 MEQ tablet Take 1 tablet (20 mEq total) by mouth 2 (two) times daily.  60 tablet  5  . simvastatin (ZOCOR) 10 MG tablet Take 10 mg by mouth daily.      Marland Kitchen spironolactone (ALDACTONE) 25 MG tablet Take 1 tablet (25 mg total) by mouth daily.  30 tablet  11  . Vitamin D, Ergocalciferol, (DRISDOL) 50000 UNITS CAPS capsule Take 50,000 Units by mouth every 7 (seven) days. Take 1 every Saturday        Review of Systems  Constitutional: Positive for malaise/fatigue. Negative for fever, chills and diaphoresis.  HENT:       Pt reports occasional headaches around sleep at night   Eyes: Negative for blurred vision and double vision.  Respiratory: Positive for shortness of breath.        Occasional SOB when walking up stairs and with over exertion  Cardiovascular: Negative for chest pain and palpitations.  Gastrointestinal: Positive for abdominal pain. Negative for nausea, vomiting,  diarrhea and constipation.  Genitourinary: Negative for dysuria and urgency.  Neurological: Positive for dizziness and headaches. Negative for weakness.       Sometimes lightheaded when rising from the seated position   Physical Exam   Blood pressure 132/81, pulse 95, temperature 98.7 F (37.1 C), temperature source Oral, resp. rate 18, height 5\' 4"  (1.626 m), weight 95.89 kg (211 lb 6.4 oz), last menstrual period 01/05/2014, SpO2 99.00%.  Physical Exam  Constitutional: She is oriented to person, place, and time. She appears well-developed and well-nourished.  HENT:  Head: Normocephalic and atraumatic.  Cardiovascular: Normal rate, regular rhythm, normal heart sounds and intact distal pulses.   Respiratory: Effort normal and breath sounds normal. No respiratory distress.  GI: Soft. Bowel sounds are normal. There is tenderness. There is no guarding.  Mild tenderness to palpation of lower quadrants  Genitourinary:  Scant dark brown blood found on speculum exam. IUD strings visualized in cervical os. Bimanual exam yielded no tenderness to palpation of uterus and adnexa  Neurological: She is alert and oriented to person, place, and time.  Skin: Skin is warm.  Psychiatric: She has a normal mood and affect. Her behavior is normal.    MAU Course  Procedures none MDM CBC indicates no anemia.  Spec exam reassuring for minimal bleeding and continued presence of IUD  Assessment and Plan  A: Dysmenorrhea, uterine fibroids, IUD in place  P: Discharge to home.  PO Toradol prn for pain.  Pt counseled on reasonable expectations of IUD.  May continue to have bleeding for several weeks or months following insertion of IUD,  and importance of regular follow up with GYN.  If pt continues to require pain management, follow up with GYN clinic is even more important.   Pt advised to Pt advised to return to the MAU if symptoms worsen.  Gabriel Cirri, PA-S2 01/24/2014, 3:48 PM  I have seen and  evaluated the patient with the NP/PA/Med student. I agree with the assessment and plan as written above.   Paticia Stack, PA-C  01/24/2014 5:40 PM

## 2014-01-24 NOTE — Telephone Encounter (Signed)
Patient contacted nurse line requesting call back from nurse concerning fibroids and pain medication.  Contacted patient and patient concerned about bleeding and passing clots, pt is having lower back pain/abdominal pain.  Pt states " I feel like my body is going to fall in", encouraged patient to go to MAU at this time for treatment. Pt verbalizes understanding.

## 2014-01-24 NOTE — Discharge Instructions (Signed)

## 2014-01-24 NOTE — MAU Note (Signed)
Patient states she has a history of fibroids. Has bleeding and pain all the time. Has been getting worse since Monday.

## 2014-01-29 ENCOUNTER — Ambulatory Visit: Payer: Medicaid Other | Attending: Family Medicine | Admitting: Family Medicine

## 2014-01-29 ENCOUNTER — Other Ambulatory Visit (HOSPITAL_COMMUNITY)
Admission: RE | Admit: 2014-01-29 | Discharge: 2014-01-29 | Disposition: A | Discharge status: Home / Self Care | Payer: Medicaid Other | Source: Ambulatory Visit | Attending: Family Medicine | Admitting: Family Medicine

## 2014-01-29 ENCOUNTER — Encounter: Payer: Self-pay | Admitting: Family Medicine

## 2014-01-29 VITALS — BP 105/73 | HR 89 | Temp 98.7°F | Resp 18 | Ht 68.0 in | Wt 207.0 lb

## 2014-01-29 DIAGNOSIS — Z720 Tobacco use: Secondary | ICD-10-CM

## 2014-01-29 DIAGNOSIS — E785 Hyperlipidemia, unspecified: Secondary | ICD-10-CM

## 2014-01-29 DIAGNOSIS — H6123 Impacted cerumen, bilateral: Secondary | ICD-10-CM

## 2014-01-29 DIAGNOSIS — L659 Nonscarring hair loss, unspecified: Secondary | ICD-10-CM | POA: Insufficient documentation

## 2014-01-29 DIAGNOSIS — N920 Excessive and frequent menstruation with regular cycle: Secondary | ICD-10-CM | POA: Insufficient documentation

## 2014-01-29 DIAGNOSIS — Z23 Encounter for immunization: Secondary | ICD-10-CM

## 2014-01-29 DIAGNOSIS — I10 Essential (primary) hypertension: Secondary | ICD-10-CM

## 2014-01-29 DIAGNOSIS — Z113 Encounter for screening for infections with a predominantly sexual mode of transmission: Secondary | ICD-10-CM | POA: Diagnosis not present

## 2014-01-29 DIAGNOSIS — D259 Leiomyoma of uterus, unspecified: Secondary | ICD-10-CM

## 2014-01-29 DIAGNOSIS — N760 Acute vaginitis: Secondary | ICD-10-CM | POA: Diagnosis present

## 2014-01-29 DIAGNOSIS — I5042 Chronic combined systolic (congestive) and diastolic (congestive) heart failure: Secondary | ICD-10-CM

## 2014-01-29 DIAGNOSIS — N921 Excessive and frequent menstruation with irregular cycle: Secondary | ICD-10-CM

## 2014-01-29 DIAGNOSIS — F17200 Nicotine dependence, unspecified, uncomplicated: Secondary | ICD-10-CM

## 2014-01-29 DIAGNOSIS — N898 Other specified noninflammatory disorders of vagina: Secondary | ICD-10-CM

## 2014-01-29 DIAGNOSIS — I509 Heart failure, unspecified: Secondary | ICD-10-CM

## 2014-01-29 DIAGNOSIS — E559 Vitamin D deficiency, unspecified: Secondary | ICD-10-CM | POA: Insufficient documentation

## 2014-01-29 DIAGNOSIS — H6120 Impacted cerumen, unspecified ear: Secondary | ICD-10-CM | POA: Insufficient documentation

## 2014-01-29 MED ORDER — CARBAMIDE PEROXIDE 6.5 % OT SOLN: 5.0000 [drp] | Freq: Two times a day (BID) | OTIC | Status: DC

## 2014-01-29 MED ORDER — BIOTIN 1 MG PO CAPS: 1.0000 mg | ORAL_CAPSULE | Freq: Every day | ORAL | Status: DC

## 2014-01-29 MED ORDER — ACETAMINOPHEN-CODEINE #3 300-30 MG PO TABS: 1.0000 | ORAL_TABLET | Freq: Four times a day (QID) | ORAL | Status: DC | PRN

## 2014-01-29 NOTE — Assessment & Plan Note (Signed)
A: fibroids palpable on exam. No vaginal bleeding. IUD in place. P: IUD in place f/u with gyn Prn NSAID, discussed the benefits of NSAID Prn tylenol #3 for severe pain

## 2014-01-29 NOTE — Progress Notes (Signed)
Annual physical, pt stated has Hx fibroid  Stated has heavy menses. Complaining of hear loss

## 2014-01-29 NOTE — Progress Notes (Signed)
   Subjective:    [name], female    DOB: 01-31-1966, 48 y.o.   MRN: [id number] CC: physical, discuss menorrhgia  HPI  #1 menorrhagia: Patient is status post IUD placement on 01/11/2014. She reports having heavy menstrual bleeding in since onset of her period on 01/24/2014. She seen in the MAU on 01/24/2014.  #2 alopecia: x 3 years. Hair dry and itchy. No scarring. No previous treatment.   Social history: Former smoker Review of Systems General:  Negative for nexplained weight loss, fever Skin: Negative for new or changing mole, sore that won't heal HEENT: Negative for trouble hearing, trouble seeing, ringing in ears, mouth sores, hoarseness, change in voice, dysphagia. CV:  Negative for chest pain, dyspnea, edema, palpitations Resp: Negative for cough, dyspnea, hemoptysis GI: Positive for lower abdominal pain with menses. Negative for nausea, vomiting, diarrhea, constipation,melena, hematochezia. GU: Positive for heavy menstrual bleeding. Negative for dysuria, incontinence, urinary hesitance, hematuria, vaginal or penile discharge, polyuria, sexual difficulty, lumps in testicle or breasts MSK: Negative for muscle cramps or aches, joint pain or swelling Neuro: Negative for headaches, weakness, numbness, dizziness, passing out/fainting Psych: Negative for depression, anxiety, memory problems  GAD 7: score of 6- 1 to questions 1-6 and 0 to 7.     Objective:   Physical Exam BP 105/73  Pulse 89  Temp(Src) 98.7 F (37.1 C) (Oral)  Resp 18  Ht 5\' 8"  (1.727 m)  Wt 207 lb (93.895 kg)  BMI 31.48 kg/m2  SpO2 98%  LMP 01/05/2014  General Appearance:    Alert, cooperative, no distress, appears stated age  Head:    Normocephalic, without obvious abnormality, atraumatic  Eyes:    PERRL, conjunctiva/corneas clear, EOM's intact, , both eyes  Ears:    Cerumen both ears, irrigated still with some cerumen.    Normal  external ear canals, both ears  Nose:   Nares normal, septum  midline, mucosa normal, no drainage    or sinus tenderness  Throat:   Lips, mucosa, and tongue normal; teeth and gums normal  Neck:   Supple, symmetrical, trachea midline, no adenopathy;    thyroid:  no enlargement/tenderness/nodules;   Back:     Symmetric, no curvature, ROM normal, no CVA tenderness  Lungs:     Clear to auscultation bilaterally, respirations unlabored  Chest Wall:    No tenderness or deformity   Heart:    Regular rate and rhythm, S1 and S2 normal, no murmur, rub   or gallop  Breast Exam:   deferred   Abdomen:     Soft, non-tender, bowel sounds active all four quadrants,    no masses, no organomegaly  Genitalia:    Normal female without lesion, discharge or tenderness, normal vagina. IUD strings visualized. No discharge. Palpable fundal and R sided fibroids.   Rectal:   deferred   Extremities:   Extremities normal, atraumatic, no cyanosis or edema  Pulses:   2+ and symmetric all extremities  Skin:   Skin color, texture, turgor normal, no rashes or lesions  Lymph nodes:   Cervical, supraclavicular, and axillary nodes normal  Neurologic:   CNII-XII intact, normal strength, sensation and reflexes    throughout      Assessment & Plan:

## 2014-01-29 NOTE — Assessment & Plan Note (Signed)
For hair loss: I recommend biotin you can fine this alone or in hair skin and nail formula 1 mg daily. Additionally, I recommend monoxidil (rogaine) serum or foam daily this is also over the counter and safe for you to take.

## 2014-01-29 NOTE — Patient Instructions (Addendum)
Chelsea Branch,  Thank you for coming in today. It was a pleasure meeting you. I look forward to be a primary doctor.   1. For fibroids: continue NSAID as needed for moderate pain. Use tylenol #3 sparingly for severe pain. Your IUD is in place. You are not currently bleeding. Keep follow up with Dr. Roselie Awkward.   2. For hair loss: I recommend biotin you can fine this alone or in hair skin and nail formula 1 mg daily. Additionally, I recommend monoxidil (rogaine) serum or foam daily this is also over the counter and safe for you to take.   You will be called with results of STD test and blood work.  Apply for PASS program for medication assistance.   Dr. Adrian Blackwater

## 2014-01-30 LAB — COMPLETE METABOLIC PANEL WITH GFR
ALBUMIN: 4.1 g/dL (ref 3.5–5.2)
ALK PHOS: 91 U/L (ref 39–117)
ALT: 13 U/L (ref 0–35)
AST: 14 U/L (ref 0–37)
BUN: 13 mg/dL (ref 6–23)
CALCIUM: 9.6 mg/dL (ref 8.4–10.5)
CHLORIDE: 104 meq/L (ref 96–112)
CO2: 26 mEq/L (ref 19–32)
Creat: 0.74 mg/dL (ref 0.50–1.10)
GFR, Est African American: >89 mL/min
GFR, Est Non African American: >89 mL/min
Glucose, Bld: 76 mg/dL (ref 70–99)
POTASSIUM: 4.3 meq/L (ref 3.5–5.3)
Sodium: 136 mEq/L (ref 135–145)
Total Bilirubin: 0.5 mg/dL (ref 0.2–1.2)
Total Protein: 7.1 g/dL (ref 6.0–8.3)

## 2014-01-30 LAB — LIPID PANEL
CHOL/HDL RATIO: 2.9 ratio
Cholesterol: 125 mg/dL (ref 0–200)
HDL: 43 mg/dL (ref >39)
LDL CALC: 68 mg/dL (ref 0–99)
Triglycerides: 69 mg/dL (ref <150)
VLDL: 14 mg/dL (ref 0–40)

## 2014-01-31 ENCOUNTER — Telehealth: Payer: Self-pay | Admitting: *Deleted

## 2014-01-31 DIAGNOSIS — N898 Other specified noninflammatory disorders of vagina: Secondary | ICD-10-CM | POA: Insufficient documentation

## 2014-01-31 MED ORDER — FLUCONAZOLE 150 MG PO TABS: 150.0000 mg | ORAL_TABLET | Freq: Once | ORAL | Status: DC

## 2014-01-31 MED ORDER — METRONIDAZOLE 500 MG PO TABS: 500.0000 mg | ORAL_TABLET | Freq: Two times a day (BID) | ORAL | Status: DC

## 2014-01-31 NOTE — Addendum Note (Signed)
Addended by: Boykin Nearing on: 01/31/2014 08:54 AM   Modules accepted: Orders

## 2014-01-31 NOTE — Telephone Encounter (Signed)
Message copied by Betti Cruz on Thu Jan 31, 2014  5:49 PM ------      Message from: Boykin Nearing      Created: Wed Jan 30, 2014  9:41 AM       Normal CMP.       Normal lipids. Continue simvastatin for secondary stroke prevention ------

## 2014-01-31 NOTE — Assessment & Plan Note (Signed)
Former smoker 

## 2014-01-31 NOTE — Assessment & Plan Note (Signed)
Positive BV on wet prep. Sent in flagyl x 7 day course, followed by diflucan x one to on site pharmacy.

## 2014-01-31 NOTE — Telephone Encounter (Signed)
Left voice message to return call 

## 2014-01-31 NOTE — Telephone Encounter (Signed)
Message copied by Betti Cruz on Thu Jan 31, 2014  5:52 PM ------      Message from: Boykin Nearing      Created: Wed Jan 30, 2014  9:41 AM       Normal CMP.       Normal lipids. Continue simvastatin for secondary stroke prevention ------

## 2014-02-01 ENCOUNTER — Encounter: Payer: Self-pay | Admitting: *Deleted

## 2014-02-01 ENCOUNTER — Telehealth: Payer: Self-pay | Admitting: Family Medicine

## 2014-02-01 NOTE — Telephone Encounter (Signed)
Pt. Returning call. Please f/u with pt.

## 2014-02-01 NOTE — Telephone Encounter (Signed)
Pt aware of lab results, stated she is taking simvastatin as prescribed      Message from: Boykin Nearing      Created: Wed Jan 30, 2014  9:41 AM        Normal CMP.        Normal lipids. Continue simvastatin for secondary stroke prevention

## 2014-02-05 ENCOUNTER — Telehealth: Payer: Self-pay | Admitting: *Deleted

## 2014-02-05 NOTE — Telephone Encounter (Signed)
Returned patient's call. Discussed reasons for taking flagyl, diflucan and simvastatin. Patient stated that she took the diflucan the day she picked up the Rx. Patient is aware that she needs to continue the flagyl twice daily till it's finished. and to avoid alcohol while taking it. Pt states that she does not drink. Patient states that she has an upcoming appt with GYN to have IUD checked.

## 2014-02-11 ENCOUNTER — Emergency Department (HOSPITAL_COMMUNITY): Payer: Medicaid Other

## 2014-02-11 ENCOUNTER — Emergency Department (HOSPITAL_COMMUNITY)
Admission: EM | Admit: 2014-02-11 | Discharge: 2014-02-11 | Disposition: A | Discharge status: Home / Self Care | Payer: Medicaid Other | Attending: Emergency Medicine | Admitting: Emergency Medicine

## 2014-02-11 ENCOUNTER — Encounter (HOSPITAL_COMMUNITY): Payer: Self-pay | Admitting: Emergency Medicine

## 2014-02-11 DIAGNOSIS — G4730 Sleep apnea, unspecified: Secondary | ICD-10-CM | POA: Diagnosis not present

## 2014-02-11 DIAGNOSIS — M545 Low back pain, unspecified: Secondary | ICD-10-CM | POA: Diagnosis present

## 2014-02-11 DIAGNOSIS — Z8742 Personal history of other diseases of the female genital tract: Secondary | ICD-10-CM | POA: Diagnosis not present

## 2014-02-11 DIAGNOSIS — Z79899 Other long term (current) drug therapy: Secondary | ICD-10-CM | POA: Diagnosis not present

## 2014-02-11 DIAGNOSIS — Z8669 Personal history of other diseases of the nervous system and sense organs: Secondary | ICD-10-CM | POA: Diagnosis not present

## 2014-02-11 DIAGNOSIS — X58XXXA Exposure to other specified factors, initial encounter: Secondary | ICD-10-CM | POA: Diagnosis not present

## 2014-02-11 DIAGNOSIS — Z87891 Personal history of nicotine dependence: Secondary | ICD-10-CM | POA: Diagnosis not present

## 2014-02-11 DIAGNOSIS — Y929 Unspecified place or not applicable: Secondary | ICD-10-CM | POA: Insufficient documentation

## 2014-02-11 DIAGNOSIS — Q054 Unspecified spina bifida with hydrocephalus: Secondary | ICD-10-CM | POA: Insufficient documentation

## 2014-02-11 DIAGNOSIS — E785 Hyperlipidemia, unspecified: Secondary | ICD-10-CM | POA: Diagnosis not present

## 2014-02-11 DIAGNOSIS — I10 Essential (primary) hypertension: Secondary | ICD-10-CM | POA: Insufficient documentation

## 2014-02-11 DIAGNOSIS — I5042 Chronic combined systolic (congestive) and diastolic (congestive) heart failure: Secondary | ICD-10-CM | POA: Insufficient documentation

## 2014-02-11 DIAGNOSIS — Z8673 Personal history of transient ischemic attack (TIA), and cerebral infarction without residual deficits: Secondary | ICD-10-CM | POA: Insufficient documentation

## 2014-02-11 DIAGNOSIS — Z9981 Dependence on supplemental oxygen: Secondary | ICD-10-CM | POA: Diagnosis not present

## 2014-02-11 DIAGNOSIS — Z872 Personal history of diseases of the skin and subcutaneous tissue: Secondary | ICD-10-CM | POA: Diagnosis not present

## 2014-02-11 DIAGNOSIS — Y939 Activity, unspecified: Secondary | ICD-10-CM | POA: Insufficient documentation

## 2014-02-11 DIAGNOSIS — S335XXA Sprain of ligaments of lumbar spine, initial encounter: Secondary | ICD-10-CM | POA: Diagnosis not present

## 2014-02-11 DIAGNOSIS — S39012A Strain of muscle, fascia and tendon of lower back, initial encounter: Secondary | ICD-10-CM

## 2014-02-11 LAB — COMPREHENSIVE METABOLIC PANEL
ALT: 14 U/L (ref 0–35)
ANION GAP: 13 (ref 5–15)
AST: 22 U/L (ref 0–37)
Albumin: 3.4 g/dL — ABNORMAL LOW (ref 3.5–5.2)
Alkaline Phosphatase: 107 U/L (ref 39–117)
BUN: 9 mg/dL (ref 6–23)
CALCIUM: 8.9 mg/dL (ref 8.4–10.5)
CO2: 26 meq/L (ref 19–32)
Chloride: 98 mEq/L (ref 96–112)
Creatinine, Ser: 0.67 mg/dL (ref 0.50–1.10)
Glucose, Bld: 78 mg/dL (ref 70–99)
Potassium: 3.8 mEq/L (ref 3.7–5.3)
SODIUM: 137 meq/L (ref 137–147)
Total Bilirubin: <0.2 mg/dL — ABNORMAL LOW (ref 0.3–1.2)
Total Protein: 7.2 g/dL (ref 6.0–8.3)

## 2014-02-11 LAB — CBC WITH DIFFERENTIAL/PLATELET
Basophils Absolute: 0 10*3/uL (ref 0.0–0.1)
Basophils Relative: 0 % (ref 0–1)
EOS ABS: 0.2 10*3/uL (ref 0.0–0.7)
Eosinophils Relative: 2 % (ref 0–5)
HEMATOCRIT: 41.1 % (ref 36.0–46.0)
Hemoglobin: 13.7 g/dL (ref 12.0–15.0)
LYMPHS ABS: 3.1 10*3/uL (ref 0.7–4.0)
Lymphocytes Relative: 42 % (ref 12–46)
MCH: 28.5 pg (ref 26.0–34.0)
MCHC: 33.3 g/dL (ref 30.0–36.0)
MCV: 85.4 fL (ref 78.0–100.0)
MONO ABS: 0.7 10*3/uL (ref 0.1–1.0)
Monocytes Relative: 9 % (ref 3–12)
Neutro Abs: 3.5 10*3/uL (ref 1.7–7.7)
Neutrophils Relative %: 47 % (ref 43–77)
PLATELETS: 231 10*3/uL (ref 150–400)
RBC: 4.81 MIL/uL (ref 3.87–5.11)
RDW: 14.5 % (ref 11.5–15.5)
WBC: 7.4 10*3/uL (ref 4.0–10.5)

## 2014-02-11 LAB — URINALYSIS, ROUTINE W REFLEX MICROSCOPIC
Bilirubin Urine: NEGATIVE
Glucose, UA: NEGATIVE mg/dL
Ketones, ur: NEGATIVE mg/dL
Nitrite: NEGATIVE
PROTEIN: NEGATIVE mg/dL
SPECIFIC GRAVITY, URINE: 1.011 (ref 1.005–1.030)
UROBILINOGEN UA: 0.2 mg/dL (ref 0.0–1.0)
pH: 6 (ref 5.0–8.0)

## 2014-02-11 LAB — URINE MICROSCOPIC-ADD ON

## 2014-02-11 LAB — PRO B NATRIURETIC PEPTIDE: Pro B Natriuretic peptide (BNP): 10.7 pg/mL (ref 0–125)

## 2014-02-11 LAB — TROPONIN I: Troponin I: <0.3 ng/mL (ref <0.30)

## 2014-02-11 MED ORDER — TRAMADOL HCL 50 MG PO TABS: 50.0000 mg | ORAL_TABLET | Freq: Four times a day (QID) | ORAL | Status: DC | PRN

## 2014-02-11 MED ORDER — FUROSEMIDE 10 MG/ML IJ SOLN
80.0000 mg | Freq: Once | INTRAMUSCULAR | Status: AC
  Administered 2014-02-11: 80 mg via INTRAVENOUS
  Filled 2014-02-11: qty 8

## 2014-02-11 MED ORDER — CYCLOBENZAPRINE HCL 10 MG PO TABS: 10.0000 mg | ORAL_TABLET | Freq: Three times a day (TID) | ORAL | Status: DC | PRN

## 2014-02-11 NOTE — ED Notes (Addendum)
Pt stated she was experiencing low back pain x 3wks. Pt states the pain is unbearable and she has difficulty walking. Pt states she had an IUD inserted in August and has since been experiencing back pain.

## 2014-02-11 NOTE — ED Provider Notes (Signed)
CSN: [id number]     Arrival date & time 02/11/14  1321 History   First MD Initiated Contact with Patient 02/11/14 1732     Chief Complaint  Patient presents with  . Back Pain     (Consider location/radiation/quality/duration/timing/severity/associated sxs/prior Treatment) HPI Comments: Patient presents to the ER with multiple complaints. Patient reports that she has been having lower back pain for the last 3 weeks. The pain is more on the left side. It worsens when she bends over or tries to get up from sitting down or lying in bed. She denies any injury. No change in bowel or bladder function. No weakness, numbness or tingling in lower extremities.  Patient also reports that she thinks her congestive heart failure is worsening. She does report a 7 pound weight gain over the past couple of weeks. She has been taking her diuretics as prescribed. Patient reports shortness of breath, both at rest and with exertion. She has not been experiencing any chest pain. She feels like her abdomen is distended and has increased fluid in her legs as well.  Patient is a 48 y.o. female presenting with back pain.  Back Pain   Past Medical History  Diagnosis Date  . MITRAL REGURGITATION 10/15/2008  . 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 Alopecia   . Nonischemic cardiomyopathy     iniatially presumed 2/2 peripartum with improvement and then worsening - EF 25-30% by echo 07/2013.  Marland Kitchen Noncompliance   . Tobacco abuse   . Fibroids   . Abnormal uterine bleeding   . Menorrhagia   . Peripartum cardiomyopathy   . DYSLIPIDEMIA 06/08/2010  . Chronic combined systolic and diastolic CHF (congestive heart failure) 1994    a. 07/2013  EF of 25-30%, LV, mod LVH, diffuse hypokinesis and mild MR  . Hypertension 1994  . Stroke 1994  . Sleep apnea 2015    CPAP 12/2013   Past Surgical History  Procedure Laterality Date  . Loop recorder implant  ~ 2000  . Loop recorder explant    . Cesarean section  1992  1994  .  Tubal ligation  1994   Family History  Problem Relation Age of Onset  . Other Neg Hx   . Heart disease Neg Hx   . Cancer Maternal Grandmother     uterine  . Hypertension Sister    History  Substance Use Topics  . Smoking status: Former Smoker -- 0.12 packs/day for 30 years    Types: Cigarettes    Quit date: 05/24/2010  . Smokeless tobacco: Never Used  . Alcohol Use: No   OB History   Grav Para Term Preterm Abortions TAB SAB Ect Mult Living   2 2 2      1 3      Review of Systems  Respiratory: Positive for shortness of breath.   Cardiovascular: Positive for leg swelling.  Musculoskeletal: Positive for back pain.  All other systems reviewed and are negative.     Allergies  Ace inhibitors  Home Medications   Prior to Admission medications   Medication Sig Start Date End Date Taking? Authorizing Provider  acetaminophen-codeine (TYLENOL #3) 300-30 MG per tablet Take 1 tablet by mouth every 6 (six) hours as needed for moderate pain or severe pain. 01/29/14   Minerva Ends, MD  Biotin 1 MG CAPS Take 1 mg by mouth daily. 01/29/14   Josalyn C Funches, MD  carbamide peroxide (DEBROX) 6.5 % otic solution Place 5 drops into both ears 2 (two) times  daily. 01/29/14   Minerva Ends, MD  fluconazole (DIFLUCAN) 150 MG tablet Take 1 tablet (150 mg total) by mouth once. 01/31/14   Josalyn C Funches, MD  furosemide (LASIX) 40 MG tablet Take 1 tablet (40 mg total) by mouth daily. 01/15/14   Deboraha Sprang, MD  hydrALAZINE (APRESOLINE) 25 MG tablet Take 1 tablet (25 mg total) by mouth 2 (two) times daily. 09/03/13 09/03/14  Reyne Dumas, MD  ketorolac (TORADOL) 10 MG tablet Take 1 tablet (10 mg total) by mouth every 6 (six) hours as needed. 01/24/14   Paticia Stack, PA-C  losartan (COZAAR) 25 MG tablet Take 1 tablet (25 mg total) by mouth 2 (two) times daily. 09/06/13   Deboraha Sprang, MD  metroNIDAZOLE (FLAGYL) 500 MG tablet Take 1 tablet (500 mg total) by mouth 2 (two) times daily.  01/31/14   Josalyn C Funches, MD  omeprazole (PRILOSEC) 20 MG capsule Take 20 mg by mouth daily.    Historical Provider, MD  potassium chloride SA (K-DUR,KLOR-CON) 20 MEQ tablet Take 1 tablet (20 mEq total) by mouth 2 (two) times daily. 09/05/13   Deboraha Sprang, MD  simvastatin (ZOCOR) 10 MG tablet Take 10 mg by mouth daily.    Historical Provider, MD  spironolactone (ALDACTONE) 25 MG tablet Take 1 tablet (25 mg total) by mouth daily. 09/05/13   Deboraha Sprang, MD  Vitamin D, Ergocalciferol, (DRISDOL) 50000 UNITS CAPS capsule Take 50,000 Units by mouth every 7 (seven) days. Take 1 every Saturday    Historical Provider, MD   BP 121/84  Pulse 83  Temp(Src) 98.4 F (36.9 C) (Oral)  Resp 19  SpO2 99% Physical Exam  Constitutional: She is oriented to person, place, and time. She appears well-developed and well-nourished. No distress.  HENT:  Head: Normocephalic and atraumatic.  Right Ear: Hearing normal.  Left Ear: Hearing normal.  Nose: Nose normal.  Mouth/Throat: Oropharynx is clear and moist and mucous membranes are normal.  Eyes: Conjunctivae and EOM are normal. Pupils are equal, round, and reactive to light.  Neck: Normal range of motion. Neck supple.  Cardiovascular: Regular rhythm, S1 normal and S2 normal.  Exam reveals no gallop and no friction rub.   No murmur heard. Pulmonary/Chest: Effort normal and breath sounds normal. No respiratory distress. She exhibits no tenderness.  Abdominal: Soft. Normal appearance and bowel sounds are normal. There is no hepatosplenomegaly. There is no tenderness. There is no rebound, no guarding, no tenderness at McBurney's point and negative Murphy's sign. No hernia.  Musculoskeletal: Normal range of motion. She exhibits edema (trace bilateral lower extremities).       Lumbar back: She exhibits tenderness. She exhibits no bony tenderness.       Back:  Neurological: She is alert and oriented to person, place, and time. She has normal strength. No  cranial nerve deficit or sensory deficit. Coordination normal. GCS eye subscore is 4. GCS verbal subscore is 5. GCS motor subscore is 6.  Skin: Skin is warm, dry and intact. No rash noted. No cyanosis.  Psychiatric: She has a normal mood and affect. Her speech is normal and behavior is normal. Thought content normal.    ED Course  Procedures (including critical care time) Labs Review Labs Reviewed  COMPREHENSIVE METABOLIC PANEL - Abnormal; Notable for the following:    Albumin 3.4 (*)    Total Bilirubin <0.2 (*)    All other components within normal limits  URINALYSIS, ROUTINE W REFLEX MICROSCOPIC - Abnormal;  Notable for the following:    APPearance CLOUDY (*)    Hgb urine dipstick LARGE (*)    Leukocytes, UA TRACE (*)    All other components within normal limits  URINE MICROSCOPIC-ADD ON - Abnormal; Notable for the following:    Squamous Epithelial / LPF FEW (*)    Bacteria, UA FEW (*)    All other components within normal limits  CBC WITH DIFFERENTIAL  TROPONIN I  PRO B NATRIURETIC PEPTIDE    Imaging Review Dg Chest 2 View  02/11/2014   CLINICAL DATA:  Shortness of breath, edema, history of congestive heart failure  EXAM: CHEST  2 VIEW  COMPARISON:  07/31/2013  FINDINGS: The heart size and mediastinal contours are within normal limits. Both lungs are clear. The visualized skeletal structures are unremarkable.  IMPRESSION: No active cardiopulmonary disease.   Electronically Signed   By: Skipper Cliche M.D.   On: 02/11/2014 19:35     EKG Interpretation   Date/Time:  Monday February 11 2014 18:25:36 EDT Ventricular Rate:  81 PR Interval:    QRS Duration: 80 QT Interval:  382 QTC Calculation: 443 R Axis:   35 Text Interpretation:  Normal sinus rhythm Nonspecific ST abnormality No  significant change since last tracing Confirmed by Brande Uncapher  MD,  Grace Haggart [phone]) on 02/11/2014 7:58:23 PM      MDM   Final diagnoses:  None   lumbar strain  Patient returns to the ER  for evaluation of back pain. Patient reports that this has been ongoing for 3 weeks. Pain is paraspinal in the lumbar region. It worsens with movement. This is consistent with muscular low back pain. No neurologic findings to suggest cauda equina syndrome or herniated disc.  Patient also thinks she is retaining fluid. She does have some trace lower extremity edema. She reports a history of congestive heart failure. The patient feels like she has had a 7 pound weight gain in the recent increased abdominal girth. Patient administered Lasix here in the ER with diuresis. Her workup is largely unremarkable. Patient will be discharged, follow up with her primary care doctor.    Orpah Greek, MD 02/11/14 2015

## 2014-02-11 NOTE — Discharge Instructions (Signed)
Back Pain, Adult Back pain is very common. The pain often gets better over time. The cause of back pain is usually not dangerous. Most people can learn to manage their back pain on their own.  HOME CARE   Stay active. Start with short walks on flat ground if you can. Try to walk farther each day.  Do not sit, drive, or stand in one place for more than 30 minutes. Do not stay in bed.  Do not avoid exercise or work. Activity can help your back heal faster.  Be careful when you bend or lift an object. Bend at your knees, keep the object close to you, and do not twist.  Sleep on a firm mattress. Lie on your side, and bend your knees. If you lie on your back, put a pillow under your knees.  Only take medicines as told by your doctor.  Put ice on the injured area.  Put ice in a plastic bag.  Place a towel between your skin and the bag.  Leave the ice on for 15-20 minutes, 03-04 times a day for the first 2 to 3 days. After that, you can switch between ice and heat packs.  Ask your doctor about back exercises or massage.  Avoid feeling anxious or stressed. Find good ways to deal with stress, such as exercise. GET HELP RIGHT AWAY IF:   Your pain does not go away with rest or medicine.  Your pain does not go away in 1 week.  You have new problems.  You do not feel well.  The pain spreads into your legs.  You cannot control when you poop (bowel movement) or pee (urinate).  Your arms or legs feel weak or lose feeling (numbness).  You feel sick to your stomach (nauseous) or throw up (vomit).  You have belly (abdominal) pain.  You feel like you may pass out (faint). MAKE SURE YOU:   Understand these instructions.  Will watch your condition.  Will get help right away if you are not doing well or get worse. Document Released: 10/27/2007 Document Revised: 08/02/2011 Document Reviewed: 09/11/2013 [facility] Patient Information 2015 [place], [place]. This information is not intended  to replace advice given to you by your health care provider. Make sure you discuss any questions you have with your health care provider. Heart Failure Heart failure means your heart has trouble pumping blood. This makes it hard for your body to work well. Heart failure is usually a long-term (chronic) condition. You must take good care of yourself and follow your doctor's treatment plan. HOME CARE  Take your heart medicine as told by your doctor.  Do not stop taking medicine unless your doctor tells you to.  Do not skip any dose of medicine.  Refill your medicines before they run out.  Take other medicines only as told by your doctor or pharmacist.  Stay active if told by your doctor. The elderly and people with severe heart failure should talk with a doctor about physical activity.  Eat heart-healthy foods. Choose foods that are without trans fat and are low in saturated fat, cholesterol, and salt (sodium). This includes fresh or frozen fruits and vegetables, fish, lean meats, fat-free or low-fat dairy foods, whole grains, and high-fiber foods. Lentils and dried peas and beans (legumes) are also good choices.  Limit salt if told by your doctor.  Cook in a healthy way. Roast, grill, broil, bake, poach, steam, or stir-fry foods.  Limit fluids as told by your doctor.  Weigh yourself every morning. Do this after you pee (urinate) and before you eat breakfast. Write down your weight to give to your doctor.  Take your blood pressure and write it down if your doctor tells you to.  Ask your doctor how to check your pulse. Check your pulse as told.  Lose weight if told by your doctor.  Stop smoking or chewing tobacco. Do not use gum or patches that help you quit without your doctor's approval.  Schedule and go to doctor visits as told.  Nonpregnant women should have no more than 1 drink a day. Men should have no more than 2 drinks a day. Talk to your doctor about drinking  alcohol.  Stop illegal drug use.  Stay current with shots (immunizations).  Manage your health conditions as told by your doctor.  Learn to manage your stress.  Rest when you are tired.  If it is really hot outside:  Avoid intense activities.  Use air conditioning or fans, or get in a cooler place.  Avoid caffeine and alcohol.  Wear loose-fitting, lightweight, and light-colored clothing.  If it is really cold outside:  Avoid intense activities.  Layer your clothing.  Wear mittens or gloves, a hat, and a scarf when going outside.  Avoid alcohol.  Learn about heart failure and get support as needed.  Get help to maintain or improve your quality of life and your ability to care for yourself as needed. GET HELP IF:   You gain 03 lb/1.4 kg or more in 1 day or 05 lb/2.3 kg in a week.  You are more short of breath than usual.  You cannot do your normal activities.  You tire easily.  You cough more than normal, especially with activity.  You have any or more puffiness (swelling) in areas such as your hands, feet, ankles, or belly (abdomen).  You cannot sleep because it is hard to breathe.  You feel like your heart is beating fast (palpitations).  You get dizzy or light-headed when you stand up. GET HELP RIGHT AWAY IF:   You have trouble breathing.  There is a change in mental status, such as becoming less alert or not being able to focus.  You have chest pain or discomfort.  You faint. MAKE SURE YOU:   Understand these instructions.  Will watch your condition.  Will get help right away if you are not doing well or get worse. Document Released: 02/17/2008 Document Revised: 09/24/2013 Document Reviewed: 06/26/2012 [facility] Patient Information 2015 [place], [place]. This information is not intended to replace advice given to you by your health care provider. Make sure you discuss any questions you have with your health care provider.

## 2014-02-11 NOTE — ED Notes (Signed)
Pt here c/o left sided lower back pain and thinks she may have increased fluid in abd area; pt sts hx of CHF

## 2014-02-13 ENCOUNTER — Telehealth: Payer: Self-pay | Admitting: Internal Medicine

## 2014-02-13 NOTE — Telephone Encounter (Signed)
Walk in pt Form " Chronic Disease Referral Form &Tele-Monitoring" papers Dropped Off gave to Sherri 9.23.15/km

## 2014-02-14 ENCOUNTER — Encounter: Payer: Self-pay | Admitting: Obstetrics & Gynecology

## 2014-02-14 ENCOUNTER — Ambulatory Visit (INDEPENDENT_AMBULATORY_CARE_PROVIDER_SITE_OTHER): Payer: Medicaid Other | Admitting: Obstetrics & Gynecology

## 2014-02-14 VITALS — BP 111/69 | HR 96 | Ht 68.0 in | Wt 206.7 lb

## 2014-02-14 DIAGNOSIS — D259 Leiomyoma of uterus, unspecified: Secondary | ICD-10-CM

## 2014-02-14 DIAGNOSIS — N921 Excessive and frequent menstruation with irregular cycle: Secondary | ICD-10-CM

## 2014-02-14 DIAGNOSIS — N920 Excessive and frequent menstruation with regular cycle: Secondary | ICD-10-CM

## 2014-02-14 MED ORDER — TRAMADOL-ACETAMINOPHEN 37.5-325 MG PO TABS: 1.0000 | ORAL_TABLET | Freq: Four times a day (QID) | ORAL | Status: DC | PRN

## 2014-02-14 MED ORDER — MEDROXYPROGESTERONE ACETATE 10 MG PO TABS: 20.0000 mg | ORAL_TABLET | Freq: Every day | ORAL | Status: DC

## 2014-02-14 NOTE — Progress Notes (Signed)
Patient states that the iud is causing back pain.

## 2014-02-14 NOTE — Progress Notes (Signed)
Subjective:     [name], female   DOB: 03/29/1966, 48 y.o.   MRN: [id number]  HPI [id number] No LMP recorded. Patient has had an implant. DUB changes 2 pads a day and cramps, IUD placed 8/21. Recent ED visit and had possible increased CHF and was given lasix.   Review of Systems  Respiratory: Negative for shortness of breath.   Gastrointestinal: Negative for vomiting.  Genitourinary: Positive for menstrual problem and pelvic pain.       Objective:   Physical Exam  Constitutional: She is oriented to person, place, and time. She appears well-developed. No distress.  Genitourinary:  Blood in vault, string 2 cm, no CMT uterus 10 weeks  Neurological: She is alert and oriented to person, place, and time.  Skin: Skin is warm and dry.  Psychiatric: She has a normal mood and affect. Her behavior is normal.       Assessment:     Fibroids and DUB, h/o CVA and CHF     Plan:     Add Provera 20 mg daily until RTC 2 months  Woodroe Mode, MD 02/14/2014

## 2014-02-14 NOTE — Patient Instructions (Signed)
Levonorgestrel intrauterine device (IUD) What is this medicine? LEVONORGESTREL IUD (LEE voe nor jes trel) is a contraceptive (birth control) device. The device is placed inside the uterus by a healthcare professional. It is used to prevent pregnancy and can also be used to treat heavy bleeding that occurs during your period. Depending on the device, it can be used for 3 to 5 years. This medicine may be used for other purposes; ask your health care provider or pharmacist if you have questions. COMMON BRAND NAME(S): LILETTA, Mirena, Skyla What should I tell my health care provider before I take this medicine? They need to know if you have any of these conditions: -abnormal Pap smear -cancer of the breast, uterus, or cervix -diabetes -endometritis -genital or pelvic infection now or in the past -have more than one sexual partner or your partner has more than one partner -heart disease -history of an ectopic or tubal pregnancy -immune system problems -IUD in place -liver disease or tumor -problems with blood clots or take blood-thinners -use intravenous drugs -uterus of unusual shape -vaginal bleeding that has not been explained -an unusual or allergic reaction to levonorgestrel, other hormones, silicone, or polyethylene, medicines, foods, dyes, or preservatives -pregnant or trying to get pregnant -breast-feeding How should I use this medicine? This device is placed inside the uterus by a health care professional. Talk to your pediatrician regarding the use of this medicine in children. Special care may be needed. Overdosage: If you think you have taken too much of this medicine contact a poison control center or emergency room at once. NOTE: This medicine is only for you. Do not share this medicine with others. What if I miss a dose? This does not apply. What may interact with this medicine? Do not take this medicine with any of the following  medications: -amprenavir -bosentan -fosamprenavir This medicine may also interact with the following medications: -aprepitant -barbiturate medicines for inducing sleep or treating seizures -bexarotene -griseofulvin -medicines to treat seizures like carbamazepine, ethotoin, felbamate, oxcarbazepine, phenytoin, topiramate -modafinil -pioglitazone -rifabutin -rifampin -rifapentine -some medicines to treat HIV infection like atazanavir, indinavir, lopinavir, nelfinavir, tipranavir, ritonavir -St. John's wort -warfarin This list may not describe all possible interactions. Give your health care provider a list of all the medicines, herbs, non-prescription drugs, or dietary supplements you use. Also tell them if you smoke, drink alcohol, or use illegal drugs. Some items may interact with your medicine. What should I watch for while using this medicine? Visit your doctor or health care professional for regular check ups. See your doctor if you or your partner has sexual contact with others, becomes HIV positive, or gets a sexual transmitted disease. This product does not protect you against HIV infection (AIDS) or other sexually transmitted diseases. You can check the placement of the IUD yourself by reaching up to the top of your vagina with clean fingers to feel the threads. Do not pull on the threads. It is a good habit to check placement after each menstrual period. Call your doctor right away if you feel more of the IUD than just the threads or if you cannot feel the threads at all. The IUD may come out by itself. You may become pregnant if the device comes out. If you notice that the IUD has come out use a backup birth control method like condoms and call your health care provider. Using tampons will not change the position of the IUD and are okay to use during your period. What side effects may   I notice from receiving this medicine? Side effects that you should report to your doctor or  health care professional as soon as possible: -allergic reactions like skin rash, itching or hives, swelling of the face, lips, or tongue -fever, flu-like symptoms -genital sores -high blood pressure -no menstrual period for 6 weeks during use -pain, swelling, warmth in the leg -pelvic pain or tenderness -severe or sudden headache -signs of pregnancy -stomach cramping -sudden shortness of breath -trouble with balance, talking, or walking -unusual vaginal bleeding, discharge -yellowing of the eyes or skin Side effects that usually do not require medical attention (report to your doctor or health care professional if they continue or are bothersome): -acne -breast pain -change in sex drive or performance -changes in weight -cramping, dizziness, or faintness while the device is being inserted -headache -irregular menstrual bleeding within first 3 to 6 months of use -nausea This list may not describe all possible side effects. Call your doctor for medical advice about side effects. You may report side effects to FDA at 1-800-FDA-1088. Where should I keep my medicine? This does not apply. NOTE: This sheet is a summary. It may not cover all possible information. If you have questions about this medicine, talk to your doctor, pharmacist, or health care provider.  2015, Elsevier/Gold Standard. (2011-06-10 13:54:04)  

## 2014-02-15 ENCOUNTER — Telehealth: Payer: Self-pay | Admitting: *Deleted

## 2014-02-15 NOTE — Telephone Encounter (Signed)
Pt left message stating that she was seen yesterday by Dr. Roselie Awkward. She says that her prescription for the medication to slow down her bleeding has not been sent to [facility] and she would like it to be sent today. I called pt bacl and left message stating that we had a different pharmacy on file for her ([facility]) and the Rx was sent there. She may pick up the Rx from that pharmacy or call them and ask for it to be transferred to any pharmacy of her choice. She may call back if she has questions.

## 2014-03-05 ENCOUNTER — Encounter: Payer: Self-pay | Admitting: Pulmonary Disease

## 2014-03-05 ENCOUNTER — Ambulatory Visit (INDEPENDENT_AMBULATORY_CARE_PROVIDER_SITE_OTHER): Payer: Medicaid Other | Admitting: Pulmonary Disease

## 2014-03-05 VITALS — BP 122/70 | HR 100 | Temp 97.0°F | Ht 68.0 in | Wt 214.8 lb

## 2014-03-05 DIAGNOSIS — G4733 Obstructive sleep apnea (adult) (pediatric): Secondary | ICD-10-CM

## 2014-03-05 NOTE — Assessment & Plan Note (Signed)
The patient has not been compliant with her CPAP device because of issues with her mask. She tells me there was some type of malfunction, and a new piece had to be ordered. She has gotten this in the mail, but has not started wearing her CPAP again. I have asked her to go ahead and restart her CPAP, and to let us know if she is having difficulties with this.I have also encouraged her to work on getting her weight down.

## 2014-03-05 NOTE — Progress Notes (Signed)
   Subjective:    [name], female    DOB: 1965/09/28, 48 y.o.   MRN: [id number]  HPI The patient comes in today for followup of her obstructive sleep apnea. She was started on CPAP at a moderate pressure at the last visit, but has had great difficulties with compliance. She tells me there was something wrong with her mask, and they have to order a new piece. She has received this, but has not started using the device again.   Review of Systems  Constitutional: Negative for fever and unexpected weight change.  HENT: Negative for congestion, dental problem, ear pain, nosebleeds, postnasal drip, rhinorrhea, sinus pressure, sneezing, sore throat and trouble swallowing.   Eyes: Negative for redness and itching.  Respiratory: Negative for cough, chest tightness, shortness of breath and wheezing.   Cardiovascular: Negative for palpitations and leg swelling.  Gastrointestinal: Negative for nausea and vomiting.  Genitourinary: Negative for dysuria.  Musculoskeletal: Negative for joint swelling.  Skin: Negative for rash.  Neurological: Negative for headaches.  Hematological: Does not bruise/bleed easily.  Psychiatric/Behavioral: Negative for dysphoric mood. The patient is not nervous/anxious.        Objective:   Physical Exam Obese female in no acute distress Nose without purulence or discharge noted Neck without lymphadenopathy or thyromegaly No skin breakdown or pressure necrosis from the CPAP mask Lower extremities with edema seen, and no cyanosis Alert and oriented, moves all 4 extremities.       Assessment & Plan:

## 2014-03-05 NOTE — Patient Instructions (Signed)
Start using cpap mask with new part, and let's see how you do.  If you are unable to wear the device everynight, please call so we can help you work thru this. Keep working on weight loss followup with me again in 46mos.

## 2014-03-11 ENCOUNTER — Other Ambulatory Visit: Payer: Self-pay | Admitting: Obstetrics & Gynecology

## 2014-03-11 ENCOUNTER — Telehealth: Payer: Self-pay | Admitting: *Deleted

## 2014-03-11 ENCOUNTER — Ambulatory Visit (HOSPITAL_COMMUNITY): Payer: Medicaid Other

## 2014-03-11 DIAGNOSIS — N921 Excessive and frequent menstruation with irregular cycle: Secondary | ICD-10-CM

## 2014-03-11 DIAGNOSIS — D259 Leiomyoma of uterus, unspecified: Secondary | ICD-10-CM

## 2014-03-11 MED ORDER — TRAMADOL-ACETAMINOPHEN 37.5-325 MG PO TABS: 1.0000 | ORAL_TABLET | Freq: Four times a day (QID) | ORAL | Status: DC | PRN

## 2014-03-11 NOTE — Telephone Encounter (Signed)
Patient called requesting an rx for tramadol. She states that the one she was given got wet and she needs a replacement rx.

## 2014-03-11 NOTE — Telephone Encounter (Signed)
Called Kimala's mobile number - heard message person you are calling is unavailable. Called home number - spoke with female who said Odis wasn't there - was on her way to appointment - asked her to tell Brienne to call clinic- we are calling  Her back.  Per chart has 2pm Echo appointment. Called in prescription for ultracet per Dr. Jordan Hawks instructions to [place].

## 2014-03-11 NOTE — Telephone Encounter (Signed)
Pt called a second time @ 1100 requesting refill of Tramadol and amoxicillin?  because her prescription got wet.  She can be reached at [phone]

## 2014-03-12 ENCOUNTER — Encounter: Payer: Self-pay | Admitting: Physician Assistant

## 2014-03-12 ENCOUNTER — Ambulatory Visit (INDEPENDENT_AMBULATORY_CARE_PROVIDER_SITE_OTHER): Payer: Medicaid Other | Admitting: Physician Assistant

## 2014-03-12 VITALS — BP 115/88 | HR 115 | Ht 68.0 in | Wt 209.0 lb

## 2014-03-12 DIAGNOSIS — I428 Other cardiomyopathies: Secondary | ICD-10-CM

## 2014-03-12 DIAGNOSIS — R0602 Shortness of breath: Secondary | ICD-10-CM

## 2014-03-12 DIAGNOSIS — E785 Hyperlipidemia, unspecified: Secondary | ICD-10-CM

## 2014-03-12 DIAGNOSIS — I429 Cardiomyopathy, unspecified: Secondary | ICD-10-CM

## 2014-03-12 DIAGNOSIS — I5042 Chronic combined systolic (congestive) and diastolic (congestive) heart failure: Secondary | ICD-10-CM

## 2014-03-12 DIAGNOSIS — I10 Essential (primary) hypertension: Secondary | ICD-10-CM

## 2014-03-12 LAB — BASIC METABOLIC PANEL
BUN: 11 mg/dL (ref 6–23)
CALCIUM: 9.5 mg/dL (ref 8.4–10.5)
CO2: 22 meq/L (ref 19–32)
Chloride: 104 mEq/L (ref 96–112)
Creatinine, Ser: 0.9 mg/dL (ref 0.4–1.2)
GFR: 86.67 mL/min (ref >60.00)
Glucose, Bld: 107 mg/dL — ABNORMAL HIGH (ref 70–99)
POTASSIUM: 3.6 meq/L (ref 3.5–5.1)
SODIUM: 138 meq/L (ref 135–145)

## 2014-03-12 LAB — CBC WITH DIFFERENTIAL/PLATELET
BASOS ABS: 0.1 10*3/uL (ref 0.0–0.1)
BASOS PCT: 0.5 % (ref 0.0–3.0)
EOS ABS: 0.1 10*3/uL (ref 0.0–0.7)
Eosinophils Relative: 1 % (ref 0.0–5.0)
HCT: 46.1 % — ABNORMAL HIGH (ref 36.0–46.0)
HEMOGLOBIN: 14.7 g/dL (ref 12.0–15.0)
Lymphocytes Relative: 37.9 % (ref 12.0–46.0)
Lymphs Abs: 3.6 10*3/uL (ref 0.7–4.0)
MCHC: 32 g/dL (ref 30.0–36.0)
MCV: 85.4 fl (ref 78.0–100.0)
MONO ABS: 0.7 10*3/uL (ref 0.1–1.0)
Monocytes Relative: 7.2 % (ref 3.0–12.0)
Neutro Abs: 5.1 10*3/uL (ref 1.4–7.7)
Neutrophils Relative %: 53.4 % (ref 43.0–77.0)
Platelets: 260 10*3/uL (ref 150.0–400.0)
RBC: 5.4 Mil/uL — AB (ref 3.87–5.11)
RDW: 15.8 % — AB (ref 11.5–15.5)
WBC: 9.6 10*3/uL (ref 4.0–10.5)

## 2014-03-12 LAB — BRAIN NATRIURETIC PEPTIDE: Pro B Natriuretic peptide (BNP): 9 pg/mL (ref 0.0–100.0)

## 2014-03-12 MED ORDER — METOPROLOL SUCCINATE ER 25 MG PO TB24: 25.0000 mg | ORAL_TABLET | Freq: Every day | ORAL | Status: DC

## 2014-03-12 NOTE — Patient Instructions (Addendum)
INCREASE LASIX TO 40 MG 1 TABLET TWICE DAILY FOR 3 DAYS INCREASE POTASSIUM TO 20 MEQ DAILY FOR 3 DAYS  AFTER THE 3 DAYS OF INCREASE WITH THE LASIX AND POTASSIUM  YOU WILL THEN GO BACK TO YOUR REGULAR DOSES  START TOPROL XL 25 MG 1 TAB DAILY; NEW RX SENT IN TODAY  FOLLOW UP WITH SC[facility]ER, PAC IN 2 WEEKS SAME DAY DR. Caryl Comes IS IN THE OFFICE  LAB WORK TODAY; BMET, BNP, CBC W/DIFF  REPEAT BMET IN 1 WEEK

## 2014-03-12 NOTE — Progress Notes (Signed)
Cardiology Office Note   Date:  03/12/2014   ID:  Chelsea Branch, DOB 10/29/1965, MRN [id number]  PCP:  Minerva Ends, MD  Cardiologist:  Dr. Virl Axe     History of Present Illness: Chelsea Branch is a 48 y.o. female with a hx of peri-partum/NICM, chronic combined systolic and diastolic CHF, HTN, HL, prior CVA, OSA, tobacco abuse.  EF had previously improved.  But, patient was admitted in 3/15 with acute HF and Echo demonstrated EF 25-30%.  She was last seen by Dr. Virl Axe in 7/15.  Since then, a FU echo demonstrates improved LVF with EF 50-55% in 12/2013.    She presents today for the evaluation of weight gain and dyspnea.  She has noted a 17 lb weight gains since 3/15.  She feels swollen in her legs and abdomen.  She notes 4 pillow orthopnea.  Denies PND.  She denies exertional chest, arm or jaw pain.  She denies syncope.  She has not taken any extra Lasix.  She is adherent to a low salt diet.  She does not drink excessive amounts of fluid.    Studies:  - LHC (9/04):  EF 27%, no significant CAD (mild irregs in RCA)  - Echo (3/15):  Mod LVH, EF 25-30%, mild MR  - Echo (8/15):  Mod LVH, EF 50-55%   Recent Labs/Images:  Recent Labs  08/10/13 1638  01/29/14 1527 02/11/14 1830  NA  --   < > 136 137  K  --   < > 4.3 3.8  BUN  --   < > 13 9  CREATININE  --   < > 0.74 0.67  ALT  --   < > 13 14  HGB  --   < >  --  13.7  TSH 0.392  --   --   --   LDLCALC  --   --  68  --   HDL  --   --  43  --   PROBNP  --   --   --  10.7  < > = values in this interval not displayed.   Dg Chest 2 View  02/11/2014   IMPRESSION: No active cardiopulmonary disease.   Electronically Signed   By: Skipper Cliche M.D.   On: 02/11/2014 19:35     Wt Readings from Last 3 Encounters:  03/12/14 209 lb (94.802 kg)  03/05/14 214 lb 12.8 oz (97.433 kg)  02/14/14 206 lb 11.2 oz (93.759 kg)     Past Medical History  Diagnosis Date  . MITRAL REGURGITATION 10/15/2008  . 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 Alopecia   . Nonischemic cardiomyopathy     iniatially presumed 2/2 peripartum with improvement and then worsening - EF 25-30% by echo 07/2013.  Marland Kitchen Noncompliance   . Tobacco abuse   . Fibroids   . Abnormal uterine bleeding   . Menorrhagia   . Peripartum cardiomyopathy   . DYSLIPIDEMIA 06/08/2010  . Chronic combined systolic and diastolic CHF (congestive heart failure) 1994    a. 07/2013  EF of 25-30%, LV, mod LVH, diffuse hypokinesis and mild MR  . Hypertension 1994  . Stroke 1994  . Sleep apnea 2015    CPAP 12/2013    Current Outpatient Prescriptions  Medication Sig Dispense Refill  . acetaminophen-codeine (TYLENOL #3) 300-30 MG per tablet Take 1 tablet by mouth every 6 (six) hours as needed for moderate pain or severe pain.  30 tablet  0  . Biotin  1 MG CAPS Take 1 mg by mouth daily.      . cyclobenzaprine (FLEXERIL) 10 MG tablet Take 1 tablet (10 mg total) by mouth 3 (three) times daily as needed for muscle spasms.  20 tablet  0  . furosemide (LASIX) 40 MG tablet Take 1 tablet (40 mg total) by mouth daily.  30 tablet  6  . hydrALAZINE (APRESOLINE) 25 MG tablet Take 1 tablet (25 mg total) by mouth 2 (two) times daily.  180 tablet  3  . ketorolac (TORADOL) 10 MG tablet Take 1 tablet (10 mg total) by mouth every 6 (six) hours as needed.  20 tablet  0  . losartan (COZAAR) 25 MG tablet Take 1 tablet (25 mg total) by mouth 2 (two) times daily.  60 tablet  6  . medroxyPROGESTERone (PROVERA) 10 MG tablet Take 2 tablets (20 mg total) by mouth daily.  30 tablet  2  . omeprazole (PRILOSEC) 20 MG capsule Take 20 mg by mouth daily.      . potassium chloride SA (K-DUR,KLOR-CON) 20 MEQ tablet Take 1 tablet (20 mEq total) by mouth 2 (two) times daily.  60 tablet  5  . simvastatin (ZOCOR) 10 MG tablet Take 10 mg by mouth daily.      Marland Kitchen spironolactone (ALDACTONE) 25 MG tablet Take 1 tablet (25 mg total) by mouth daily.  30 tablet  11  . traMADol (ULTRAM) 50 MG tablet Take 1 tablet (50 mg  total) by mouth every 6 (six) hours as needed.  15 tablet  0  . traMADol-acetaminophen (ULTRACET) 37.5-325 MG per tablet Take 1-2 tablets by mouth every 6 (six) hours as needed.  30 tablet  0  . Vitamin D, Ergocalciferol, (DRISDOL) 50000 UNITS CAPS capsule Take 50,000 Units by mouth every 7 (seven) days. Take 1 every Saturday       No current facility-administered medications for this visit.     Allergies:   Ace inhibitors   Social History:  The patient  reports that she quit smoking about 3 years ago. Her smoking use included Cigarettes. She has a 3.6 pack-year smoking history. She has never used smokeless tobacco. She reports that she does not drink alcohol or use illicit drugs.   Family History:  The patient's family history includes Cancer in her maternal grandmother; Hypertension in her sister. There is no history of Other or Heart disease.   ROS:  Please see the history of present illness.      All other systems reviewed and negative.    PHYSICAL EXAM: VS:  BP 115/88  Pulse 115  Ht 5\' 8"  (1.727 m)  Wt 209 lb (94.802 kg)  BMI 31.79 kg/m2 Well nourished, well developed, in no acute distress HEENT: normal Neck: no JVD Cardiac:  normal S1, S2; RRR; no murmurno S3 Lungs:  clear to auscultation bilaterally, no wheezing, rhonchi or rales Abd: ? distended, nontender, no hepatomegaly Ext:  Very trace bilateral LE edema Skin: warm and dry Neuro:  CNs 2-12 intact, no focal abnormalities noted  EKG:  Sinus tachy, normal axis, inf-lat ST changes, similar to prior tracings   ASSESSMENT AND PLAN:  1.  Chronic combined systolic and diastolic heart failure:  Patient presents with 17 lbs weight gain and symptoms of increase dyspnea (NYHA 2b), orthopnea and increased abdominal girth.  Symptoms sounds c/w acute HF.  However, she does not look particularly volume overloaded on exam.  In looking at her weights, they have definitely trended up since 3/15.  It  is not clear how much of this is  from diet or volume excess.  She is adherent to a low salt diet.  Recent echo demonstrated improved LVF with mod LVH.      -  Check BMET, BNP, CBC.    -  Increase Lasix to 40 mg bid x 3 days.  Take extra K+ 20 mEq each day.    -  Repeat BMET in 1 week. 2.  NICM (nonischemic cardiomyopathy):  Continue hydralazine, ARB, spironolactone.  HR is high.  She had alopecia with Carvedilol.  I will add Toprol-XL 25 mg QD. 3.  Essential hypertension:  Controlled.  4.  HLD (hyperlipidemia):  Continue statin.    Disposition:   FU with me in 3 weeks.    Signed, Versie Starks, MHS 03/12/2014 2:08 PM    [facility] [address], [place], [place]  [address] Phone: [phone]; Fax: [phone]

## 2014-03-12 NOTE — Telephone Encounter (Addendum)
Received call from [facility]-- state they do not carry Ultracet. Called patient who states we can call RX in to [facility] on [address]. Informed patient it would be called in. Verbalized understanding. No questions or concerns.  RX called in.

## 2014-03-13 ENCOUNTER — Telehealth: Payer: Self-pay | Admitting: *Deleted

## 2014-03-13 NOTE — Telephone Encounter (Signed)
lmptcb for lab results 

## 2014-03-14 NOTE — Telephone Encounter (Signed)
s/w man x 2 at pt's home who said pt is on her way back home said he will have her cb .

## 2014-03-14 NOTE — Telephone Encounter (Signed)
lmptcb x 2. person answered today and said pt will be back in 10 minutes. I gave [phone] for ptcb.

## 2014-03-15 ENCOUNTER — Telehealth: Payer: Self-pay | Admitting: *Deleted

## 2014-03-15 ENCOUNTER — Encounter: Payer: Self-pay | Admitting: *Deleted

## 2014-03-15 NOTE — Telephone Encounter (Signed)
Lab results given to patient. She is going to pick up Metoprolol today.

## 2014-03-15 NOTE — Telephone Encounter (Signed)
lmptcb x 3 to go over lab results. I will send out letter today to pt asking her tcb so that we may go over lab results and recommendations.

## 2014-03-15 NOTE — Telephone Encounter (Signed)
Follow up ° ° ° ° °Want lab results °

## 2014-03-19 ENCOUNTER — Other Ambulatory Visit: Payer: Medicaid Other

## 2014-03-25 ENCOUNTER — Encounter: Payer: Self-pay | Admitting: Physician Assistant

## 2014-03-26 ENCOUNTER — Encounter: Payer: Medicaid Other | Admitting: Physician Assistant

## 2014-03-26 DIAGNOSIS — I428 Other cardiomyopathies: Secondary | ICD-10-CM | POA: Insufficient documentation

## 2014-03-26 NOTE — Progress Notes (Signed)
This encounter was created in error - please disregard.

## 2014-03-26 NOTE — Progress Notes (Deleted)
Cardiology Office Note   Date:  03/26/2014   ID:  Chelsea Branch, DOB 1966/04/10, MRN [id number]  PCP:  Minerva Ends, MD  Cardiologist:  Dr. Virl Axe     History of Present Illness: Chelsea Branch is a 48 y.o. female with a hx of peri-partum/NICM, chronic combined systolic and diastolic CHF, HTN, HL, prior CVA, OSA, tobacco abuse.  EF had previously improved.  But, patient was admitted in 3/15 with acute HF and Echo demonstrated EF 25-30%.  She was last seen by Dr. Virl Axe in 7/15.  Since then, a FU echo demonstrates improved LVF with EF 50-55% in 12/2013.    I saw her 03/12/14 with complaints of weight gain and dyspnea. She had symptoms that sounded consistent with volume excess. Exam was fairly benign. BNP was normal. I had her increase her Lasix for just 3 days. I also placed her back on beta blocker therapy with Toprol-XL 25 mg daily. She returns for follow-up.  ***   Studies:  - LHC (9/04):  EF 27%, no significant CAD (mild irregs in RCA)  - Echo (3/15):  Mod LVH, EF 25-30%, mild MR  - Echo (8/15):  Mod LVH, EF 50-55%   Recent Labs/Images:  Recent Labs  08/10/13 1638  01/29/14 1527 02/11/14 1830 03/12/14 1450  NA  --   < > 136 137 138  K  --   < > 4.3 3.8 3.6  BUN  --   < > 13 9 11   CREATININE  --   < > 0.74 0.67 0.9  ALT  --   < > 13 14  --   HGB  --   < >  --  13.7 14.7  TSH 0.392  --   --   --   --   LDLCALC  --   --  68  --   --   HDL  --   --  43  --   --   PROBNP  --   --   --  10.7 9.0  < > = values in this interval not displayed.      Wt Readings from Last 3 Encounters:  03/12/14 209 lb (94.802 kg)  03/05/14 214 lb 12.8 oz (97.433 kg)  02/14/14 206 lb 11.2 oz (93.759 kg)     Past Medical History  Diagnosis Date  . MITRAL REGURGITATION 10/15/2008  . 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 Alopecia   . Nonischemic cardiomyopathy     iniatially presumed 2/2 peripartum with improvement and then worsening - EF 25-30% by echo 07/2013.  Marland Kitchen  Noncompliance   . Tobacco abuse   . Fibroids   . Abnormal uterine bleeding   . Menorrhagia   . Peripartum cardiomyopathy   . DYSLIPIDEMIA 06/08/2010  . Chronic combined systolic and diastolic CHF (congestive heart failure) 1994    a. 07/2013  EF of 25-30%, LV, mod LVH, diffuse hypokinesis and mild MR  . Hypertension 1994  . Stroke 1994  . Sleep apnea 2015    CPAP 12/2013    Current Outpatient Prescriptions  Medication Sig Dispense Refill  . acetaminophen-codeine (TYLENOL #3) 300-30 MG per tablet Take 1 tablet by mouth every 6 (six) hours as needed for moderate pain or severe pain. 30 tablet 0  . Biotin 1 MG CAPS Take 1 mg by mouth daily.    . cyclobenzaprine (FLEXERIL) 10 MG tablet Take 1 tablet (10 mg total) by mouth 3 (three) times daily as needed for muscle spasms.  20 tablet 0  . furosemide (LASIX) 40 MG tablet Take 1 tablet (40 mg total) by mouth daily. 30 tablet 6  . hydrALAZINE (APRESOLINE) 25 MG tablet Take 1 tablet (25 mg total) by mouth 2 (two) times daily. 180 tablet 3  . ketorolac (TORADOL) 10 MG tablet Take 1 tablet (10 mg total) by mouth every 6 (six) hours as needed. 20 tablet 0  . losartan (COZAAR) 25 MG tablet Take 1 tablet (25 mg total) by mouth 2 (two) times daily. 60 tablet 6  . medroxyPROGESTERone (PROVERA) 10 MG tablet Take 2 tablets (20 mg total) by mouth daily. 30 tablet 2  . metoprolol succinate (TOPROL-XL) 25 MG 24 hr tablet Take 1 tablet (25 mg total) by mouth daily. 30 tablet 11  . omeprazole (PRILOSEC) 20 MG capsule Take 20 mg by mouth daily.    . potassium chloride SA (K-DUR,KLOR-CON) 20 MEQ tablet Take 1 tablet (20 mEq total) by mouth 2 (two) times daily. 60 tablet 5  . simvastatin (ZOCOR) 10 MG tablet Take 10 mg by mouth daily.    Marland Kitchen spironolactone (ALDACTONE) 25 MG tablet Take 1 tablet (25 mg total) by mouth daily. 30 tablet 11  . traMADol (ULTRAM) 50 MG tablet Take 1 tablet (50 mg total) by mouth every 6 (six) hours as needed. 15 tablet 0  .  traMADol-acetaminophen (ULTRACET) 37.5-325 MG per tablet Take 1-2 tablets by mouth every 6 (six) hours as needed. 30 tablet 0  . Vitamin D, Ergocalciferol, (DRISDOL) 50000 UNITS CAPS capsule Take 50,000 Units by mouth every 7 (seven) days. Take 1 every Saturday     No current facility-administered medications for this visit.     Allergies:   Ace inhibitors   Social History:  The patient  reports that she quit smoking about 3 years ago. Her smoking use included Cigarettes. She has a 3.6 pack-year smoking history. She has never used smokeless tobacco. She reports that she does not drink alcohol or use illicit drugs.   Family History:  The patient's family history includes Cancer in her maternal grandmother; Hypertension in her sister. There is no history of Other or Heart disease.   ROS:  Please see the history of present illness.      All other systems reviewed and negative.    PHYSICAL EXAM: VS:  There were no vitals taken for this visit. Well nourished, well developed, in no acute distress HEENT: normal Neck: no JVD Cardiac:  normal S1, S2; RRR; no murmurno S3 Lungs:  clear to auscultation bilaterally, no wheezing, rhonchi or rales Abd: ? distended, nontender, no hepatomegaly Ext:  Very trace bilateral LE edema Skin: warm and dry Neuro:  CNs 2-12 intact, no focal abnormalities noted  EKG:  ***   ASSESSMENT AND PLAN:  1.  Chronic combined systolic and diastolic heart failure:  *** 2.  NICM (nonischemic cardiomyopathy):  ***    -  Continue hydralazine, ARB, spironolactone, beta blocker. 3.  Essential hypertension:  ***Controlled.  4.  HLD (hyperlipidemia):  ***Continue statin.    Disposition:   FU with ***.    Signed, Versie Starks, MHS 03/26/2014 8:34 AM    [facility] [place], [place], [place]  [address] Phone: [phone]; Fax: [phone]

## 2014-03-28 ENCOUNTER — Encounter (HOSPITAL_COMMUNITY): Payer: Self-pay | Admitting: *Deleted

## 2014-03-28 ENCOUNTER — Emergency Department (HOSPITAL_COMMUNITY)
Admission: EM | Admit: 2014-03-28 | Discharge: 2014-03-28 | Discharge status: Against Medical Advice | Payer: Medicaid Other | Attending: Emergency Medicine | Admitting: Emergency Medicine

## 2014-03-28 DIAGNOSIS — I5042 Chronic combined systolic (congestive) and diastolic (congestive) heart failure: Secondary | ICD-10-CM | POA: Diagnosis not present

## 2014-03-28 DIAGNOSIS — N898 Other specified noninflammatory disorders of vagina: Secondary | ICD-10-CM | POA: Insufficient documentation

## 2014-03-28 DIAGNOSIS — M545 Low back pain: Secondary | ICD-10-CM | POA: Insufficient documentation

## 2014-03-28 DIAGNOSIS — I10 Essential (primary) hypertension: Secondary | ICD-10-CM | POA: Diagnosis not present

## 2014-03-28 NOTE — ED Notes (Signed)
Pt in c/o brown vaginal discharge for the last 5 days, and lower back pain, no distress noted

## 2014-04-03 ENCOUNTER — Encounter (HOSPITAL_COMMUNITY)
Admission: AD | Disposition: A | Discharge status: Home / Self Care | Payer: Self-pay | Source: Ambulatory Visit | Attending: Obstetrics and Gynecology

## 2014-04-03 ENCOUNTER — Other Ambulatory Visit: Payer: Self-pay | Admitting: Internal Medicine

## 2014-04-03 ENCOUNTER — Encounter (HOSPITAL_COMMUNITY): Payer: Self-pay | Admitting: General Practice

## 2014-04-03 ENCOUNTER — Inpatient Hospital Stay (HOSPITAL_COMMUNITY)
Admission: AD | Admit: 2014-04-03 | Discharge: 2014-04-03 | Disposition: A | Discharge status: Home / Self Care | Payer: Medicaid Other | Source: Ambulatory Visit | Attending: Obstetrics and Gynecology | Admitting: Obstetrics and Gynecology

## 2014-04-03 DIAGNOSIS — N938 Other specified abnormal uterine and vaginal bleeding: Secondary | ICD-10-CM | POA: Diagnosis not present

## 2014-04-03 DIAGNOSIS — R102 Pelvic and perineal pain: Secondary | ICD-10-CM | POA: Diagnosis not present

## 2014-04-03 LAB — CBC
HCT: 39.5 % (ref 36.0–46.0)
Hemoglobin: 13.1 g/dL (ref 12.0–15.0)
MCH: 27.8 pg (ref 26.0–34.0)
MCHC: 33.2 g/dL (ref 30.0–36.0)
MCV: 83.9 fL (ref 78.0–100.0)
PLATELETS: 239 10*3/uL (ref 150–400)
RBC: 4.71 MIL/uL (ref 3.87–5.11)
RDW: 15.5 % (ref 11.5–15.5)
WBC: 7.4 10*3/uL (ref 4.0–10.5)

## 2014-04-03 LAB — URINALYSIS, ROUTINE W REFLEX MICROSCOPIC
Bilirubin Urine: NEGATIVE
Glucose, UA: NEGATIVE mg/dL
KETONES UR: NEGATIVE mg/dL
Leukocytes, UA: NEGATIVE
NITRITE: NEGATIVE
Protein, ur: NEGATIVE mg/dL
Specific Gravity, Urine: 1.025 (ref 1.005–1.030)
Urobilinogen, UA: 0.2 mg/dL (ref 0.0–1.0)
pH: 6 (ref 5.0–8.0)

## 2014-04-03 LAB — WET PREP, GENITAL
CLUE CELLS WET PREP: NONE SEEN
Trich, Wet Prep: NONE SEEN
Yeast Wet Prep HPF POC: NONE SEEN

## 2014-04-03 LAB — POCT PREGNANCY, URINE: Preg Test, Ur: NEGATIVE

## 2014-04-03 LAB — URINE MICROSCOPIC-ADD ON

## 2014-04-03 LAB — HIV ANTIBODY (ROUTINE TESTING W REFLEX): HIV 1&2 Ab, 4th Generation: NONREACTIVE

## 2014-04-03 SURGERY — LAPAROSCOPY OPERATIVE
Anesthesia: Choice

## 2014-04-03 MED ORDER — MEGESTROL ACETATE 20 MG PO TABS: 20.0000 mg | ORAL_TABLET | Freq: Every day | ORAL | Status: DC

## 2014-04-03 MED ORDER — KETOROLAC TROMETHAMINE 60 MG/2ML IM SOLN
60.0000 mg | Freq: Once | INTRAMUSCULAR | Status: AC
  Administered 2014-04-03: 60 mg via INTRAMUSCULAR
  Filled 2014-04-03: qty 2

## 2014-04-03 NOTE — MAU Provider Note (Signed)
History     CSN: [id number]  Arrival date and time: 04/03/14 1132   First Provider Initiated Contact with Patient 04/03/14 1226      Chief Complaint  Patient presents with  . Vaginal Bleeding   HPI    Ms. Chelsea Branch is a 48 y.o. female who presents with vaginal bleeding; with a history of DUB.  This is the same bleeding she has had for 2 months. She has had continuous  bleeding for 5 days and feels this is way to much bleeding. She is scheduled to be seen in the clinic at the end of the month for a follow up . She has an IUD in place and was recently taking provera. She has taken megace in the past and feels it worked better for her. She also has a history of fibroids.   OB History    Gravida Para Term Preterm AB TAB SAB Ectopic Multiple Living   2 2 2      1 3       Past Medical History  Diagnosis Date  . MITRAL REGURGITATION 10/15/2008  . 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 Alopecia   . Nonischemic cardiomyopathy     iniatially presumed 2/2 peripartum with improvement and then worsening - EF 25-30% by echo 07/2013.  Marland Kitchen Noncompliance   . Tobacco abuse   . Fibroids   . Abnormal uterine bleeding   . Menorrhagia   . Peripartum cardiomyopathy   . DYSLIPIDEMIA 06/08/2010  . Chronic combined systolic and diastolic CHF (congestive heart failure) 1994    a. 07/2013  EF of 25-30%, LV, mod LVH, diffuse hypokinesis and mild MR  . Hypertension 1994  . Stroke 1994  . Sleep apnea 2015    CPAP 12/2013    Past Surgical History  Procedure Laterality Date  . Loop recorder implant  ~ 2000  . Loop recorder explant    . Cesarean section  1992  1994  . Tubal ligation  1994    Family History  Problem Relation Age of Onset  . Other Neg Hx   . Heart disease Neg Hx   . Cancer Maternal Grandmother     uterine  . Hypertension Sister     History  Substance Use Topics  . Smoking status: Former Smoker -- 0.12 packs/day for 30 years    Types: Cigarettes    Quit date: 05/24/2010   . Smokeless tobacco: Never Used  . Alcohol Use: No    Allergies:  Allergies  Allergen Reactions  . Ace Inhibitors     REACTION: Cough    Prescriptions prior to admission  Medication Sig Dispense Refill Last Dose  . Biotin 1 MG CAPS Take 1 mg by mouth daily.   04/02/2014 at Unknown time  . furosemide (LASIX) 40 MG tablet Take 1 tablet (40 mg total) by mouth daily. 30 tablet 6 Past Week at Unknown time  . hydrALAZINE (APRESOLINE) 25 MG tablet Take 1 tablet (25 mg total) by mouth 2 (two) times daily. 180 tablet 3 04/02/2014 at Unknown time  . losartan (COZAAR) 25 MG tablet Take 1 tablet (25 mg total) by mouth 2 (two) times daily. 60 tablet 6 04/02/2014 at Unknown time  . medroxyPROGESTERone (PROVERA) 10 MG tablet Take 2 tablets (20 mg total) by mouth daily. 30 tablet 2 04/02/2014 at Unknown time  . potassium chloride SA (K-DUR,KLOR-CON) 20 MEQ tablet Take 1 tablet (20 mEq total) by mouth 2 (two) times daily. 60 tablet 5 04/02/2014 at Unknown time  .  simvastatin (ZOCOR) 10 MG tablet Take 10 mg by mouth daily.   04/02/2014 at Unknown time  . traMADol-acetaminophen (ULTRACET) 37.5-325 MG per tablet Take 1-2 tablets by mouth every 6 (six) hours as needed. (Patient taking differently: Take 1-2 tablets by mouth every 6 (six) hours as needed for moderate pain. ) 30 tablet 0 04/02/2014 at Unknown time  . acetaminophen-codeine (TYLENOL #3) 300-30 MG per tablet Take 1 tablet by mouth every 6 (six) hours as needed for moderate pain or severe pain. (Patient not taking: Reported on 04/03/2014) 30 tablet 0 Taking  . cyclobenzaprine (FLEXERIL) 10 MG tablet Take 1 tablet (10 mg total) by mouth 3 (three) times daily as needed for muscle spasms. (Patient not taking: Reported on 04/03/2014) 20 tablet 0 Taking  . ketorolac (TORADOL) 10 MG tablet Take 1 tablet (10 mg total) by mouth every 6 (six) hours as needed. (Patient not taking: Reported on 04/03/2014) 20 tablet 0 Taking  . metoprolol succinate (TOPROL-XL) 25  MG 24 hr tablet Take 1 tablet (25 mg total) by mouth daily. (Patient not taking: Reported on 04/03/2014) 30 tablet 11 03/28/2014 at 0800  . omeprazole (PRILOSEC) 20 MG capsule Take 20 mg by mouth daily.   03/28/2014 at Unknown time  . spironolactone (ALDACTONE) 25 MG tablet Take 1 tablet (25 mg total) by mouth daily. (Patient not taking: Reported on 04/03/2014) 30 tablet 11 03/28/2014 at Unknown time  . traMADol (ULTRAM) 50 MG tablet Take 1 tablet (50 mg total) by mouth every 6 (six) hours as needed. (Patient not taking: Reported on 04/03/2014) 15 tablet 0 Taking  . Vitamin D, Ergocalciferol, (DRISDOL) 50000 UNITS CAPS capsule Take 50,000 Units by mouth every 7 (seven) days. Take 1 every Saturday   Past Week at Unknown time   Results for orders placed or performed during the hospital encounter of 04/03/14 (from the past 48 hour(s))  Urinalysis, Routine w reflex microscopic     Status: Abnormal   Collection Time: 04/03/14 11:45 AM  Result Value Ref Range   Color, Urine YELLOW YELLOW   APPearance CLEAR CLEAR   Specific Gravity, Urine 1.025 1.005 - 1.030   pH 6.0 5.0 - 8.0   Glucose, UA NEGATIVE NEGATIVE mg/dL   Hgb urine dipstick LARGE (A) NEGATIVE   Bilirubin Urine NEGATIVE NEGATIVE   Ketones, ur NEGATIVE NEGATIVE mg/dL   Protein, ur NEGATIVE NEGATIVE mg/dL   Urobilinogen, UA 0.2 0.0 - 1.0 mg/dL   Nitrite NEGATIVE NEGATIVE   Leukocytes, UA NEGATIVE NEGATIVE  Urine microscopic-add on     Status: Abnormal   Collection Time: 04/03/14 11:45 AM  Result Value Ref Range   Squamous Epithelial / LPF FEW (A) RARE   WBC, UA 0-2 <3 WBC/hpf   RBC / HPF 7-10 <3 RBC/hpf   Bacteria, UA FEW (A) RARE   Urine-Other MUCOUS PRESENT   Pregnancy, urine POC     Status: None   Collection Time: 04/03/14 11:52 AM  Result Value Ref Range   Preg Test, Ur NEGATIVE NEGATIVE    Comment:        THE SENSITIVITY OF THIS METHODOLOGY IS >24 mIU/mL   CBC     Status: None   Collection Time: 04/03/14 12:29 PM  Result  Value Ref Range   WBC 7.4 4.0 - 10.5 K/uL   RBC 4.71 3.87 - 5.11 MIL/uL   Hemoglobin 13.1 12.0 - 15.0 g/dL   HCT 39.5 36.0 - 46.0 %   MCV 83.9 78.0 - 100.0 fL   MCH  27.8 26.0 - 34.0 pg   MCHC 33.2 30.0 - 36.0 g/dL   RDW 15.5 11.5 - 15.5 %   Platelets 239 150 - 400 K/uL  HIV antibody     Status: None   Collection Time: 04/03/14 12:29 PM  Result Value Ref Range   HIV 1&2 Ab, 4th Generation NONREACTIVE NONREACTIVE    Comment: (NOTE) A NONREACTIVE HIV Ag/Ab result does not exclude HIV infection since the time frame for seroconversion is variable. If acute HIV infection is suspected, a HIV-1 RNA Qualitative TMA test is recommended. HIV-1/2 Antibody Diff         Not indicated. HIV-1 RNA, Qual TMA           Not indicated. PLEASE NOTE: This information has been disclosed to you from records whose confidentiality may be protected by state law. If your state requires such protection, then the state law prohibits you from making any further disclosure of the information without the specific written consent of the person to whom it pertains, or as otherwise permitted by law. A general authorization for the release of medical or other information is NOT sufficient for this purpose. The performance of this assay has not been clinically validated in patients less than 72 years old. Performed at [facility], genital     Status: Abnormal   Collection Time: 04/03/14 12:50 PM  Result Value Ref Range   Yeast Wet Prep HPF POC NONE SEEN NONE SEEN   Trich, Wet Prep NONE SEEN NONE SEEN   Clue Cells Wet Prep HPF POC NONE SEEN NONE SEEN   WBC, Wet Prep HPF POC FEW (A) NONE SEEN    Comment: FEW BACTERIA SEEN     Review of Systems  Genitourinary:       +vaginal bleeding   Musculoskeletal: Positive for back pain.   Physical Exam   Blood pressure 136/84, pulse 100, temperature 98.1 F (36.7 C), temperature source Oral, resp. rate 18, weight 90.719 kg (200 lb), SpO2 100  %.  Physical Exam  Constitutional: She is oriented to person, place, and time. She appears well-developed and well-nourished. No distress.  Obese   HENT:  Head: Normocephalic.  Eyes: Pupils are equal, round, and reactive to light.  Respiratory: Effort normal.  GI: Soft. She exhibits distension (Uterus feels >10 weeks ). There is no tenderness. There is no rebound.  Genitourinary:  Speculum exam: Vagina - Small amount of dark brown discharge, no odor Cervix - No contact bleeding, IUD string visualized  Bimanual exam: Cervix closed, no CMT  Uterus with tenderness  GC/Chlam, wet prep done Chaperone present for exam.   Musculoskeletal: Normal range of motion.  Neurological: She is alert and oriented to person, place, and time.  Skin: Skin is warm. She is not diaphoretic.  Psychiatric: Her behavior is normal.    MAU Course  Procedures  None  MDM Toradol 60 mg IM  Wet prep GC HIV  CBC ; stable hemoglobin  Patient denies pain at the time of discharge  Assessment and Plan  A: 1. DUB (dysfunctional uterine bleeding)   2. Pelvic pain in female    P: Discharge home in stable condition RX: Megace Ok to take ibuprofen as needed, as directed on the bottle Pelvic US ordered outpatient to evaluate uterine fibroids, [place] will call to schedule Bleeding precautions Stop provera  Keep scheduled follow up appointment in the clinic   [place], NP 04/03/2014 7:16 PM

## 2014-04-03 NOTE — Discharge Instructions (Signed)
Abdominal Pain, Women °Abdominal (stomach, pelvic, or belly) pain can be caused by many things. It is important to tell your doctor: °· The location of the pain. °· Does it come and go or is it present all the time? °· Are there things that start the pain (eating certain foods, exercise)? °· Are there other symptoms associated with the pain (fever, nausea, vomiting, diarrhea)? °All of this is helpful to know when trying to find the cause of the pain. °CAUSES  °· Stomach: virus or bacteria infection, or ulcer. °· Intestine: appendicitis (inflamed appendix), regional ileitis (Crohn's disease), ulcerative colitis (inflamed colon), irritable bowel syndrome, diverticulitis (inflamed diverticulum of the colon), or cancer of the stomach or intestine. °· Gallbladder disease or stones in the gallbladder. °· Kidney disease, kidney stones, or infection. °· Pancreas infection or cancer. °· Fibromyalgia (pain disorder). °· Diseases of the female organs: °· Uterus: fibroid (non-cancerous) tumors or infection. °· Fallopian tubes: infection or tubal pregnancy. °· Ovary: cysts or tumors. °· Pelvic adhesions (scar tissue). °· Endometriosis (uterus lining tissue growing in the pelvis and on the pelvic organs). °· Pelvic congestion syndrome (female organs filling up with blood just before the menstrual period). °· Pain with the menstrual period. °· Pain with ovulation (producing an egg). °· Pain with an IUD (intrauterine device, birth control) in the uterus. °· Cancer of the female organs. °· Functional pain (pain not caused by a disease, may improve without treatment). °· Psychological pain. °· Depression. °DIAGNOSIS  °Your doctor will decide the seriousness of your pain by doing an examination. °· Blood tests. °· X-rays. °· Ultrasound. °· CT scan (computed tomography, special type of X-ray). °· MRI (magnetic resonance imaging). °· Cultures, for infection. °· Barium enema (dye inserted in the large intestine, to better view it with  X-rays). °· Colonoscopy (looking in intestine with a lighted tube). °· Laparoscopy (minor surgery, looking in abdomen with a lighted tube). °· Major abdominal exploratory surgery (looking in abdomen with a large incision). °TREATMENT  °The treatment will depend on the cause of the pain.  °· Many cases can be observed and treated at home. °· Over-the-counter medicines recommended by your caregiver. °· Prescription medicine. °· Antibiotics, for infection. °· Birth control pills, for painful periods or for ovulation pain. °· Hormone treatment, for endometriosis. °· Nerve blocking injections. °· Physical therapy. °· Antidepressants. °· Counseling with a psychologist or psychiatrist. °· Minor or major surgery. °HOME CARE INSTRUCTIONS  °· Do not take laxatives, unless directed by your caregiver. °· Take over-the-counter pain medicine only if ordered by your caregiver. Do not take aspirin because it can cause an upset stomach or bleeding. °· Try a clear liquid diet (broth or water) as ordered by your caregiver. Slowly move to a bland diet, as tolerated, if the pain is related to the stomach or intestine. °· Have a thermometer and take your temperature several times a day, and record it. °· Bed rest and sleep, if it helps the pain. °· Avoid sexual intercourse, if it causes pain. °· Avoid stressful situations. °· Keep your follow-up appointments and tests, as your caregiver orders. °· If the pain does not go away with medicine or surgery, you may try: °· Acupuncture. °· Relaxation exercises (yoga, meditation). °· Group therapy. °· Counseling. °SEEK MEDICAL CARE IF:  °· You notice certain foods cause stomach pain. °· Your home care treatment is not helping your pain. °· You need stronger pain medicine. °· You want your IUD removed. °· You feel faint or   lightheaded. °· You develop nausea and vomiting. °· You develop a rash. °· You are having side effects or an allergy to your medicine. °SEEK IMMEDIATE MEDICAL CARE IF:  °· Your  pain does not go away or gets worse. °· You have a fever. °· Your pain is felt only in portions of the abdomen. The right side could possibly be appendicitis. The left lower portion of the abdomen could be colitis or diverticulitis. °· You are passing blood in your stools (bright red or black tarry stools, with or without vomiting). °· You have blood in your urine. °· You develop chills, with or without a fever. °· You pass out. °MAKE SURE YOU:  °· Understand these instructions. °· Will watch your condition. °· Will get help right away if you are not doing well or get worse. °Document Released: 03/07/2007 Document Revised: 09/24/2013 Document Reviewed: 03/27/2009 °ExitCare® Patient Information ©2015 [employer]. This information is not intended to replace advice given to you by your health care provider. Make sure you discuss any questions you have with your health care provider. ° ° ° °Abnormal Uterine Bleeding °Abnormal uterine bleeding can affect women at various stages in life, including teenagers, women in their reproductive years, pregnant women, and women who have reached menopause. Several kinds of uterine bleeding are considered abnormal, including: °· Bleeding or spotting between periods.   °· Bleeding after sexual intercourse.   °· Bleeding that is heavier or more than normal.   °· Periods that last longer than usual. °· Bleeding after menopause.   °Many cases of abnormal uterine bleeding are minor and simple to treat, while others are more serious. Any type of abnormal bleeding should be evaluated by your health care provider. Treatment will depend on the cause of the bleeding. °HOME CARE INSTRUCTIONS °Monitor your condition for any changes. The following actions may help to alleviate any discomfort you are experiencing: °· Avoid the use of tampons and douches as directed by your health care provider. °· Change your pads frequently. °You should get regular pelvic exams and Pap tests. Keep all follow-up  appointments for diagnostic tests as directed by your health care provider.  °SEEK MEDICAL CARE IF:  °· Your bleeding lasts more than 1 week.   °· You feel dizzy at times.   °SEEK IMMEDIATE MEDICAL CARE IF:  °· You pass out.   °· You are changing pads every 15 to 30 minutes.   °· You have abdominal pain. °· You have a fever.   °· You become sweaty or weak.   °· You are passing large blood clots from the vagina.   °· You start to feel nauseous and vomit. °MAKE SURE YOU:  °· Understand these instructions. °· Will watch your condition. °· Will get help right away if you are not doing well or get worse. °Document Released: 05/10/2005 Document Revised: 05/15/2013 Document Reviewed: 12/07/2012 °ExitCare® Patient Information ©2015 [employer]. This information is not intended to replace advice given to you by your health care provider. Make sure you discuss any questions you have with your health care provider. ° °

## 2014-04-03 NOTE — MAU Note (Signed)
Pt presents to MAU with c/o moderate vaginal bleeding that is ongoing. [facility]. She states she has an IUD in and is taking progesterone. Pt states she has been told she is not a good surgical candidate due to her congestive heart failure.

## 2014-04-04 LAB — GC/CHLAMYDIA PROBE AMP
CT Probe RNA: NEGATIVE
GC PROBE AMP APTIMA: NEGATIVE

## 2014-04-12 ENCOUNTER — Encounter: Payer: Self-pay | Admitting: Obstetrics & Gynecology

## 2014-04-12 ENCOUNTER — Other Ambulatory Visit: Payer: Self-pay | Admitting: Internal Medicine

## 2014-04-12 ENCOUNTER — Ambulatory Visit (INDEPENDENT_AMBULATORY_CARE_PROVIDER_SITE_OTHER): Payer: Medicaid Other | Admitting: Obstetrics & Gynecology

## 2014-04-12 VITALS — BP 143/87 | HR 87 | Temp 97.5°F | Ht 68.0 in | Wt 212.0 lb

## 2014-04-12 DIAGNOSIS — N921 Excessive and frequent menstruation with irregular cycle: Secondary | ICD-10-CM

## 2014-04-12 DIAGNOSIS — D259 Leiomyoma of uterus, unspecified: Secondary | ICD-10-CM

## 2014-04-12 MED ORDER — IBUPROFEN 600 MG PO TABS: 600.0000 mg | ORAL_TABLET | Freq: Four times a day (QID) | ORAL | Status: DC | PRN

## 2014-04-12 NOTE — Patient Instructions (Signed)

## 2014-04-12 NOTE — Progress Notes (Signed)
Subjective:     [name], female   DOB: 1965-07-20, 48 y.o.   MRN: [id number]  HPI was still bleeding using Provera, but not bleeding on Megace. Concern about weight gain.   Review of Systems  Constitutional: Positive for unexpected weight change.  Respiratory: Negative.   Genitourinary: Positive for menstrual problem. Negative for vaginal bleeding.  Musculoskeletal: Positive for back pain.       Objective:   Physical Exam  Constitutional: She is oriented to person, place, and time. She appears well-developed. No distress.  Pulmonary/Chest: Effort normal.  Neurological: She is alert and oriented to person, place, and time.  Psychiatric: She has a normal mood and affect. Her behavior is normal.       Assessment:     Fibroids, DUB, significant CV  risk     Plan:     Continue megace, F/U with Kathlen Mody PA and RTC 2 mo 15 min face to face Woodroe Mode, MD 04/12/2014

## 2014-04-17 ENCOUNTER — Ambulatory Visit: Payer: Self-pay | Admitting: Obstetrics

## 2014-04-17 ENCOUNTER — Telehealth: Payer: Self-pay

## 2014-04-17 NOTE — Telephone Encounter (Signed)
called and left message with patient's mother - Dr. Jodi Mourning cannot see her today

## 2014-04-23 ENCOUNTER — Other Ambulatory Visit (HOSPITAL_COMMUNITY): Payer: Self-pay | Admitting: Obstetrics and Gynecology

## 2014-04-23 DIAGNOSIS — R102 Pelvic and perineal pain: Secondary | ICD-10-CM

## 2014-04-23 DIAGNOSIS — N938 Other specified abnormal uterine and vaginal bleeding: Secondary | ICD-10-CM

## 2014-04-24 ENCOUNTER — Ambulatory Visit (HOSPITAL_COMMUNITY): Admission: RE | Admit: 2014-04-24 | Payer: Medicaid Other | Source: Ambulatory Visit

## 2014-04-29 ENCOUNTER — Ambulatory Visit: Payer: Medicaid Other | Admitting: Physician Assistant

## 2014-04-29 ENCOUNTER — Other Ambulatory Visit: Payer: Medicaid Other

## 2014-05-13 ENCOUNTER — Telehealth: Payer: Self-pay

## 2014-05-13 ENCOUNTER — Inpatient Hospital Stay (HOSPITAL_COMMUNITY): Payer: Medicaid Other

## 2014-05-13 ENCOUNTER — Encounter (HOSPITAL_COMMUNITY): Payer: Self-pay | Admitting: *Deleted

## 2014-05-13 ENCOUNTER — Inpatient Hospital Stay (HOSPITAL_COMMUNITY)
Admission: AD | Admit: 2014-05-13 | Discharge: 2014-05-13 | Disposition: A | Discharge status: Home / Self Care | Payer: Medicaid Other | Source: Ambulatory Visit | Attending: Obstetrics & Gynecology | Admitting: Obstetrics & Gynecology

## 2014-05-13 DIAGNOSIS — I10 Essential (primary) hypertension: Secondary | ICD-10-CM | POA: Insufficient documentation

## 2014-05-13 DIAGNOSIS — N938 Other specified abnormal uterine and vaginal bleeding: Secondary | ICD-10-CM | POA: Insufficient documentation

## 2014-05-13 DIAGNOSIS — N921 Excessive and frequent menstruation with irregular cycle: Secondary | ICD-10-CM | POA: Insufficient documentation

## 2014-05-13 DIAGNOSIS — Z87891 Personal history of nicotine dependence: Secondary | ICD-10-CM | POA: Diagnosis not present

## 2014-05-13 DIAGNOSIS — D259 Leiomyoma of uterus, unspecified: Secondary | ICD-10-CM | POA: Diagnosis not present

## 2014-05-13 DIAGNOSIS — N939 Abnormal uterine and vaginal bleeding, unspecified: Secondary | ICD-10-CM

## 2014-05-13 LAB — WET PREP, GENITAL
CLUE CELLS WET PREP: NONE SEEN
TRICH WET PREP: NONE SEEN
YEAST WET PREP: NONE SEEN

## 2014-05-13 LAB — URINE MICROSCOPIC-ADD ON

## 2014-05-13 LAB — POCT PREGNANCY, URINE: Preg Test, Ur: NEGATIVE

## 2014-05-13 LAB — URINALYSIS, ROUTINE W REFLEX MICROSCOPIC
Bilirubin Urine: NEGATIVE
Glucose, UA: NEGATIVE mg/dL
Ketones, ur: NEGATIVE mg/dL
Leukocytes, UA: NEGATIVE
Nitrite: NEGATIVE
PH: 5.5 (ref 5.0–8.0)
Protein, ur: NEGATIVE mg/dL
Specific Gravity, Urine: >1.03 — ABNORMAL HIGH (ref 1.005–1.030)
Urobilinogen, UA: 0.2 mg/dL (ref 0.0–1.0)

## 2014-05-13 LAB — CBC
HCT: 40.8 % (ref 36.0–46.0)
Hemoglobin: 13.5 g/dL (ref 12.0–15.0)
MCH: 28.4 pg (ref 26.0–34.0)
MCHC: 33.1 g/dL (ref 30.0–36.0)
MCV: 85.7 fL (ref 78.0–100.0)
PLATELETS: 215 10*3/uL (ref 150–400)
RBC: 4.76 MIL/uL (ref 3.87–5.11)
RDW: 15.5 % (ref 11.5–15.5)
WBC: 6.8 10*3/uL (ref 4.0–10.5)

## 2014-05-13 MED ORDER — KETOROLAC TROMETHAMINE 60 MG/2ML IM SOLN
60.0000 mg | Freq: Once | INTRAMUSCULAR | Status: AC
  Administered 2014-05-13: 60 mg via INTRAMUSCULAR
  Filled 2014-05-13: qty 2

## 2014-05-13 MED ORDER — TRAMADOL-ACETAMINOPHEN 37.5-325 MG PO TABS: 1.0000 | ORAL_TABLET | Freq: Four times a day (QID) | ORAL | Status: DC | PRN

## 2014-05-13 MED ORDER — MEGESTROL ACETATE 20 MG PO TABS: 20.0000 mg | ORAL_TABLET | Freq: Every day | ORAL | Status: DC

## 2014-05-13 NOTE — Telephone Encounter (Signed)
Pt called the front desk and she informed me that she went to MAU this am for bleeding and pain.  And she wants to be seen.  Pt also informed me that her IUD was taken out in the MAU and she wants to be seen.  I advised pt that there is not an appt available until January but that I would speak with provider concerning about management before her appt.  Pt agreed.  Spoke with Dr. Hulan Fray and per provider she can should continue to take the Megace 20 mg po daily in which she will refill for one month and to continue to taking the tramadol as needed for pain.  I advised pt to take the ibuprofen 600 mg in between so that she will have constant pain management and so that she may not need the use of tramadol as much.  Pt agreed. I informed pt that her appt has been scheduled for 06/21/14 @1000 .  Pt stated understanding and thank you.

## 2014-05-13 NOTE — Discharge Instructions (Signed)
Fibroids Fibroids are lumps (tumors) that can occur any place in a woman's body. These lumps are not cancerous. Fibroids vary in size, weight, and where they grow. HOME CARE  Do not take aspirin.  Write down the number of pads or tampons you use during your period. Tell your doctor. This can help determine the best treatment for you. GET HELP RIGHT AWAY IF:  You have pain in your lower belly (abdomen) that is not helped with medicine.  You have cramps that are not helped with medicine.  You have more bleeding between or during your period.  You feel lightheaded or pass out (faint).  Your lower belly pain gets worse. MAKE SURE YOU:  Understand these instructions.  Will watch your condition.  Will get help right away if you are not doing well or get worse. Document Released: 06/12/2010 Document Revised: 08/02/2011 Document Reviewed: 06/12/2010 [facility] Patient Information 2015 [place], [place]. This information is not intended to replace advice given to you by your health care provider. Make sure you discuss any questions you have with your health care provider.  Abnormal Uterine Bleeding Abnormal uterine bleeding can affect women at various stages in life, including teenagers, women in their reproductive years, pregnant women, and women who have reached menopause. Several kinds of uterine bleeding are considered abnormal, including:  Bleeding or spotting between periods.   Bleeding after sexual intercourse.   Bleeding that is heavier or more than normal.   Periods that last longer than usual.  Bleeding after menopause.  Many cases of abnormal uterine bleeding are minor and simple to treat, while others are more serious. Any type of abnormal bleeding should be evaluated by your health care provider. Treatment will depend on the cause of the bleeding. HOME CARE INSTRUCTIONS Monitor your condition for any changes. The following actions may help to alleviate any discomfort you  are experiencing:  Avoid the use of tampons and douches as directed by your health care provider.  Change your pads frequently. You should get regular pelvic exams and Pap tests. Keep all follow-up appointments for diagnostic tests as directed by your health care provider.  SEEK MEDICAL CARE IF:   Your bleeding lasts more than 1 week.   You feel dizzy at times.  SEEK IMMEDIATE MEDICAL CARE IF:   You pass out.   You are changing pads every 15 to 30 minutes.   You have abdominal pain.  You have a fever.   You become sweaty or weak.   You are passing large blood clots from the vagina.   You start to feel nauseous and vomit. MAKE SURE YOU:   Understand these instructions.  Will watch your condition.  Will get help right away if you are not doing well or get worse. Document Released: 05/10/2005 Document Revised: 05/15/2013 Document Reviewed: 12/07/2012 [facility] Patient Information 2015 [place], [place]. This information is not intended to replace advice given to you by your health care provider. Make sure you discuss any questions you have with your health care provider.

## 2014-05-13 NOTE — MAU Provider Note (Signed)
History     CSN: [id number]  Arrival date and time: 05/13/14 0865   First Provider Initiated Contact with Patient 05/13/14 [phone]      Chief Complaint  Patient presents with  . Vaginal Bleeding   HPI    [name] is a 48 y.o. female G2P2003 who presents with ongoing heavy vaginal bleeding. She has an IUD in place and had it placed due to uterine fibroids. She is very frustrated because she is tired of having heavy vaginal bleeding. The bleeding is not very heavy currently, however last week it was very heavy. She is also having lower abdominal cramping.  She is a patient of the clinic downstairs and has an appointment scheduled coming up. The patient would like to discuss having a hysterectomy. She has been told in the past that she is too high risk for surgery.   OB History    Gravida Para Term Preterm AB TAB SAB Ectopic Multiple Living   2 2 2      1 3       Past Medical History  Diagnosis Date  . MITRAL REGURGITATION 10/15/2008  . 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 Alopecia   . Nonischemic cardiomyopathy     iniatially presumed 2/2 peripartum with improvement and then worsening - EF 25-30% by echo 07/2013.  Marland Kitchen Noncompliance   . Tobacco abuse   . Fibroids   . Abnormal uterine bleeding   . Menorrhagia   . Peripartum cardiomyopathy   . DYSLIPIDEMIA 06/08/2010  . Chronic combined systolic and diastolic CHF (congestive heart failure) 1994    a. 07/2013  EF of 25-30%, LV, mod LVH, diffuse hypokinesis and mild MR  . Hypertension 1994  . Stroke 1994  . Sleep apnea 2015    CPAP 12/2013    Past Surgical History  Procedure Laterality Date  . Loop recorder implant  ~ 2000  . Loop recorder explant    . Cesarean section  1992  1994  . Tubal ligation  1994    Family History  Problem Relation Age of Onset  . Other Neg Hx   . Heart disease Neg Hx   . Cancer Maternal Grandmother     uterine  . Hypertension Sister     History  Substance Use Topics  . Smoking  status: Former Smoker -- 0.12 packs/day for 30 years    Types: Cigarettes    Quit date: 05/24/2010  . Smokeless tobacco: Never Used  . Alcohol Use: No    Allergies:  Allergies  Allergen Reactions  . Ace Inhibitors     REACTION: Cough    Prescriptions prior to admission  Medication Sig Dispense Refill Last Dose  . Biotin 1 MG CAPS Take 1 mg by mouth daily.   Taking  . furosemide (LASIX) 40 MG tablet Take 1 tablet (40 mg total) by mouth daily. 30 tablet 6 Taking  . hydrALAZINE (APRESOLINE) 25 MG tablet Take 1 tablet (25 mg total) by mouth 2 (two) times daily. 180 tablet 3 Taking  . ibuprofen (ADVIL,MOTRIN) 600 MG tablet Take 1 tablet (600 mg total) by mouth every 6 (six) hours as needed. 30 tablet 3   . losartan (COZAAR) 25 MG tablet Take 1 tablet (25 mg total) by mouth 2 (two) times daily. 60 tablet 6 Taking  . megestrol (MEGACE) 20 MG tablet Take 1 tablet (20 mg total) by mouth daily. 14 tablet 0 Taking  . metoprolol succinate (TOPROL-XL) 25 MG 24 hr tablet Take 1 tablet (25 mg  total) by mouth daily. (Patient not taking: Reported on 04/03/2014) 30 tablet 11 Not Taking  . omeprazole (PRILOSEC) 20 MG capsule Take 20 mg by mouth daily.   Taking  . potassium chloride SA (K-DUR,KLOR-CON) 20 MEQ tablet Take 1 tablet (20 mEq total) by mouth 2 (two) times daily. 60 tablet 5 Taking  . simvastatin (ZOCOR) 10 MG tablet Take 10 mg by mouth daily.   Taking  . traMADol-acetaminophen (ULTRACET) 37.5-325 MG per tablet Take 1-2 tablets by mouth every 6 (six) hours as needed. (Patient taking differently: Take 1-2 tablets by mouth every 6 (six) hours as needed for moderate pain. ) 30 tablet 0 Taking  . Vitamin D, Ergocalciferol, (DRISDOL) 50000 UNITS CAPS capsule Take 50,000 Units by mouth every 7 (seven) days. Take 1 every Saturday   Taking    Results for orders placed or performed during the hospital encounter of 05/13/14 (from the past 48 hour(s))  Urinalysis, Routine w reflex microscopic     Status:  Abnormal   Collection Time: 05/13/14  8:27 AM  Result Value Ref Range   Color, Urine YELLOW YELLOW   APPearance CLEAR CLEAR   Specific Gravity, Urine >1.030 (H) 1.005 - 1.030   pH 5.5 5.0 - 8.0   Glucose, UA NEGATIVE NEGATIVE mg/dL   Hgb urine dipstick LARGE (A) NEGATIVE   Bilirubin Urine NEGATIVE NEGATIVE   Ketones, ur NEGATIVE NEGATIVE mg/dL   Protein, ur NEGATIVE NEGATIVE mg/dL   Urobilinogen, UA 0.2 0.0 - 1.0 mg/dL   Nitrite NEGATIVE NEGATIVE   Leukocytes, UA NEGATIVE NEGATIVE  Urine microscopic-add on     Status: Abnormal   Collection Time: 05/13/14  8:27 AM  Result Value Ref Range   Squamous Epithelial / LPF RARE RARE   WBC, UA 3-6 <3 WBC/hpf   Bacteria, UA FEW (A) RARE  Pregnancy, urine POC     Status: None   Collection Time: 05/13/14  8:33 AM  Result Value Ref Range   Preg Test, Ur NEGATIVE NEGATIVE    Comment:        THE SENSITIVITY OF THIS METHODOLOGY IS >24 mIU/mL   Wet prep, genital     Status: Abnormal   Collection Time: 05/13/14  9:00 AM  Result Value Ref Range   Yeast Wet Prep HPF POC NONE SEEN NONE SEEN   Trich, Wet Prep NONE SEEN NONE SEEN   Clue Cells Wet Prep HPF POC NONE SEEN NONE SEEN   WBC, Wet Prep HPF POC FEW (A) NONE SEEN    Comment: FEW BACTERIA SEEN  CBC     Status: None   Collection Time: 05/13/14  9:04 AM  Result Value Ref Range   WBC 6.8 4.0 - 10.5 K/uL   RBC 4.76 3.87 - 5.11 MIL/uL   Hemoglobin 13.5 12.0 - 15.0 g/dL   HCT 40.8 36.0 - 46.0 %   MCV 85.7 78.0 - 100.0 fL   MCH 28.4 26.0 - 34.0 pg   MCHC 33.1 30.0 - 36.0 g/dL   RDW 15.5 11.5 - 15.5 %   Platelets 215 150 - 400 K/uL  . US Transvaginal Non-ob  05/13/2014   CLINICAL DATA:  Abnormal uterine bleeding.  Assess IUD location.  EXAM: TRANSABDOMINAL AND TRANSVAGINAL ULTRASOUND OF PELVIS  TECHNIQUE: Both transabdominal and transvaginal ultrasound examinations of the pelvis were performed. Transabdominal technique was performed for global imaging of the pelvis including uterus,  ovaries, adnexal regions, and pelvic cul-de-sac. It was necessary to proceed with endovaginal exam following the transabdominal exam  to visualize the endometrium and ovaries.  COMPARISON:  None  FINDINGS: Uterus  Measurements: 12.6 x 8.2 x 8.5 cm. This corresponds to a 435 cc uterus. Multiple fibroids are identified. The largest is identified within the anterior uterine fundus measuring 4.3 x 3.3 x 4.0 cm. Within the posterior myometrium there is a sub mucosal fibroid which measures 2.5 x 1.7 x 2.8 cm.  Endometrium  Thickness: The endometrium is difficult to measure due to anatomic distortion secondary to multiple fibroids. There is an IUD located within the lower uterine segment and cervix.  Right ovary  Measurements: 2.1 x 1.2 x 1.3 cm. Simple appearing cyst measures 1.4 x 1.2 x 1.3 cm. Normal appearance/no adnexal mass.  Left ovary  Measurements: 2.8 x 1.5 x 2.2 cm. Normal appearance/no adnexal mass.  Other findings  Trace free fluid noted.  IMPRESSION: 1. Fibroid uterus. 2. At least 1 fibroid within the posterior myometrium appears submucosal. Consider further evaluation with sonohysterogram for confirmation prior to hysteroscopy. Endometrial sampling should also be considered if patient is at high risk for endometrial carcinoma. (Ref: Radiological Reasoning: Algorithmic Workup of Abnormal Vaginal Bleeding with Endovaginal Sonography and Sonohysterography. AJR 2008; 488:Q91-69) 3. The IUD is located within the lower uterine segment and cervix.   Electronically Signed   By: Kerby Moors M.D.   On: 05/13/2014 11:44   US Pelvis Complete  05/13/2014   CLINICAL DATA:  Abnormal uterine bleeding.  Assess IUD location.  EXAM: TRANSABDOMINAL AND TRANSVAGINAL ULTRASOUND OF PELVIS  TECHNIQUE: Both transabdominal and transvaginal ultrasound examinations of the pelvis were performed. Transabdominal technique was performed for global imaging of the pelvis including uterus, ovaries, adnexal regions, and pelvic  cul-de-sac. It was necessary to proceed with endovaginal exam following the transabdominal exam to visualize the endometrium and ovaries.  COMPARISON:  None  FINDINGS: Uterus  Measurements: 12.6 x 8.2 x 8.5 cm. This corresponds to a 435 cc uterus. Multiple fibroids are identified. The largest is identified within the anterior uterine fundus measuring 4.3 x 3.3 x 4.0 cm. Within the posterior myometrium there is a sub mucosal fibroid which measures 2.5 x 1.7 x 2.8 cm.  Endometrium  Thickness: The endometrium is difficult to measure due to anatomic distortion secondary to multiple fibroids. There is an IUD located within the lower uterine segment and cervix.  Right ovary  Measurements: 2.1 x 1.2 x 1.3 cm. Simple appearing cyst measures 1.4 x 1.2 x 1.3 cm. Normal appearance/no adnexal mass.  Left ovary  Measurements: 2.8 x 1.5 x 2.2 cm. Normal appearance/no adnexal mass.  Other findings  Trace free fluid noted.  IMPRESSION: 1. Fibroid uterus. 2. At least 1 fibroid within the posterior myometrium appears submucosal. Consider further evaluation with sonohysterogram for confirmation prior to hysteroscopy. Endometrial sampling should also be considered if patient is at high risk for endometrial carcinoma. (Ref: Radiological Reasoning: Algorithmic Workup of Abnormal Vaginal Bleeding with Endovaginal Sonography and Sonohysterography. AJR 2008; 450:T88-82) 3. The IUD is located within the lower uterine segment and cervix.   Electronically Signed   By: Kerby Moors M.D.   On: 05/13/2014 11:44     Review of Systems  Gastrointestinal: Positive for abdominal pain.  Genitourinary:       +Heavy vaginal bleeding    Physical Exam   Blood pressure 154/91, pulse 86, temperature 98.4 F (36.9 C), resp. rate 16, last menstrual period 05/13/2014.  Physical Exam  Constitutional: She is oriented to person, place, and time. She appears well-developed and well-nourished. No distress.  HENT:  Head: Normocephalic.  Eyes:  Pupils are equal, round, and reactive to light.  Neck: Neck supple.  Respiratory: Effort normal.  GI: Soft. Normal appearance and bowel sounds are normal. There is generalized tenderness.  Genitourinary:  Speculum exam: Vagina - Small amount of dark red blood Cervix - Small amount of bleeding from cervix. IUD strings visualized.  Bimanual exam: Cervix closed, no CMT  Uterus with tenderness, enlarged  Adnexa non tender, no masses bilaterally GC/Chlam, wet prep done Chaperone present for exam.   Musculoskeletal: Normal range of motion.  Neurological: She is alert and oriented to person, place, and time.  Skin: Skin is warm. She is not diaphoretic.  Psychiatric: Her behavior is normal.    MAU Course  Procedures  None  MDM UA CBC  [place] Consulted with Dr. Gala Romney  Toradol 60 mg IM  IUD pulled without difficulty; IUD intact at the time of removal. Minimal bleeding from the cervix following removal; patient tolerated it well.   Assessment and Plan   A:  1. Abnormal vaginal bleeding   2. Episode of heavy vaginal bleeding   3. Uterine leiomyoma, unspecified location   4. Menorrhagia with irregular cycle   5.      Chronic hypertension   P:  Discharge home in stable condition RX: Tramadol (patient request refill, #15 no refill ) Take ibuprofen as needed, as directed on the bottle  Follow up in the clinic as scheduled Safe sex practices discussed  Condoms always  Stroke/MI signs/symptoms discussed. Go to [facility] with any of the discussed.   Darrelyn Hillock Jamonte Curfman, NP 05/13/2014 4:33 PM

## 2014-05-13 NOTE — MAU Note (Signed)
Pt presents to MAU with complaints of heavy vaginal bleeding that started a week and she is having lower abdominal pain that started this morning.

## 2014-05-30 ENCOUNTER — Telehealth: Payer: Self-pay | Admitting: *Deleted

## 2014-05-30 MED ORDER — MEGESTROL ACETATE 40 MG PO TABS: 40.0000 mg | ORAL_TABLET | Freq: Two times a day (BID) | ORAL | Status: DC

## 2014-05-30 NOTE — Telephone Encounter (Signed)
Chelsea Branch called and left a message she has been doing a lot of heavy bleeding and has been to Chelsea Branch wants to know what the next step is since her Chelsea Branch is out . Called Chelsea Branch and she states she is taking the megace 20 mg once a day as ordered , but is still bleeding a lot - states is changing her pad every hour and it is full. States she feels a little week, but not bad enough to come to hospital.  I informed her if her bleeding gets worse or she starts feeling weak and/or dizzy to come to MAU for evaluation. I also informed her to keep her appointment as scheduled 06/21/14 and she can call front desk daily to see if there are cancellations to come in sooner.  I also informed her I can ask doctor if her medicine can be adjusted which she agreed with.   Called Dr. Elly Modena and orders given to increase megace to 40 mg bid  And may increase to 40 mg tid if needed to control bleeding. I sent new prescription to her pharmacy and explained to [place]. Chelsea Branch was worried about the side effects- we discussed she could increase slowly- 20mg  bid, then if not helping bleeding increase to 40mg  in am and 20 mg in evening and so on.  She voices understanding.

## 2014-06-03 ENCOUNTER — Telehealth: Payer: Self-pay | Admitting: Pharmacist

## 2014-06-03 ENCOUNTER — Other Ambulatory Visit: Payer: Self-pay | Admitting: Family Medicine

## 2014-06-03 DIAGNOSIS — E559 Vitamin D deficiency, unspecified: Secondary | ICD-10-CM

## 2014-06-05 ENCOUNTER — Other Ambulatory Visit: Payer: Self-pay | Admitting: Internal Medicine

## 2014-06-06 ENCOUNTER — Encounter (HOSPITAL_COMMUNITY): Payer: Self-pay | Admitting: Obstetrics & Gynecology

## 2014-06-21 ENCOUNTER — Ambulatory Visit (INDEPENDENT_AMBULATORY_CARE_PROVIDER_SITE_OTHER): Payer: Medicaid Other | Admitting: Obstetrics & Gynecology

## 2014-06-21 ENCOUNTER — Encounter: Payer: Self-pay | Admitting: Obstetrics & Gynecology

## 2014-06-21 VITALS — BP 111/62 | HR 101 | Temp 98.4°F | Wt 205.4 lb

## 2014-06-21 DIAGNOSIS — D250 Submucous leiomyoma of uterus: Secondary | ICD-10-CM

## 2014-06-21 DIAGNOSIS — D259 Leiomyoma of uterus, unspecified: Secondary | ICD-10-CM

## 2014-06-21 DIAGNOSIS — Z30430 Encounter for insertion of intrauterine contraceptive device: Secondary | ICD-10-CM

## 2014-06-21 DIAGNOSIS — N921 Excessive and frequent menstruation with irregular cycle: Secondary | ICD-10-CM

## 2014-06-21 DIAGNOSIS — Z01812 Encounter for preprocedural laboratory examination: Secondary | ICD-10-CM

## 2014-06-21 LAB — POCT PREGNANCY, URINE: Preg Test, Ur: NEGATIVE

## 2014-06-21 MED ORDER — TRAMADOL-ACETAMINOPHEN 37.5-325 MG PO TABS: 1.0000 | ORAL_TABLET | Freq: Four times a day (QID) | ORAL | Status: DC | PRN

## 2014-06-21 MED ORDER — IBUPROFEN 800 MG PO TABS
800.0000 mg | ORAL_TABLET | Freq: Three times a day (TID) | ORAL | Status: DC | PRN
Start: 2014-06-21 — End: 2014-09-12

## 2014-06-21 NOTE — Progress Notes (Signed)
States has been having bleeding and pain for month and half. Had IUD out in December. States medicines not helping bleeding at all.  States need refills on tramadol and ibuprofen. States never got tramadol in December filled because too sick. Wants iud again for bleeding

## 2014-06-21 NOTE — Progress Notes (Signed)
   Subjective:    [name], female    DOB: June 06, 1965, 50 y.o.   MRN: [id number]  HPI  Chelsea Branch is here for continued bleeding.   DUB: patient presented to the MAU in December and [place] noted that IUD was located in the lower uterine segment and cervix so it was removed.  [place] also showed 1 fibroid in the posterior myometrium. Since the IUD removal, she has had continued bleeding on a daily basis. She will go through 5-6 pads per day. The bleeding was controlled when the IUD was in placed. The bleeding is associated with pain in her legs. She takes ibuprofen 800 mg PRN and Tramadol PRN for this pain.  She is having some fatigue, weakness and muscle pain since the IUD was removed. She denies any fevers, chills, night sweats, dysuria, changes in bowel movements, or vaginal discharge. Her mother went through menopause at 6 years of age.    Review of Systems See HPI     Objective:   Physical Exam BP 111/62 mmHg  Pulse 101  Temp(Src) 98.4 F (36.9 C)  Wt 205 lb 6.4 oz (93.169 kg)  LMP 05/13/2014  Gen: NAD, alert, cooperative with exam, well-appearing CV: tachycardic  Pulm: no extra work of breathing   Patient identified, informed consent performed, signed copy in chart, time out was performed.  Urine pregnancy test negative.  Speculum placed in the vagina.  Cervix visualized.  Cleaned with Betadine x 2.  Grasped anteriourly with a single tooth tenaculum.  Uterus sounded to 10 cm.  Mirena IUD placed per manufacturer's recommendations.  Strings trimmed to 3 cm.   Patient given post procedure instructions and Mirena care card with expiration date.  Patient is asked to check IUD strings periodically and follow up in 4-6 weeks for IUD check.      Assessment & Plan:   DUB, Fibroids   Mirena placed today. Will have her follow up with your cardiologist for clearance for possible hysteroscopy, fibroid resection and endometrial ablation. RTC 3 weeks   - ibuprofen 800 mg given  in clinic after mirena insertion  - refilled tramadol and ibuprofen 800 mg   Rosemarie Ax, MD PGY-2, [facility] 06/21/2014, 11:13 AM  I performed the insertion and I agree with the note  Woodroe Mode, MD 06/21/2014

## 2014-07-04 ENCOUNTER — Emergency Department (HOSPITAL_COMMUNITY)
Admission: EM | Admit: 2014-07-04 | Discharge: 2014-07-04 | Disposition: A | Discharge status: Home / Self Care | Payer: Medicaid Other | Attending: Emergency Medicine | Admitting: Emergency Medicine

## 2014-07-04 ENCOUNTER — Encounter (HOSPITAL_COMMUNITY): Payer: Self-pay | Admitting: Emergency Medicine

## 2014-07-04 DIAGNOSIS — Z8639 Personal history of other endocrine, nutritional and metabolic disease: Secondary | ICD-10-CM | POA: Insufficient documentation

## 2014-07-04 DIAGNOSIS — M5440 Lumbago with sciatica, unspecified side: Secondary | ICD-10-CM | POA: Diagnosis not present

## 2014-07-04 DIAGNOSIS — M5442 Lumbago with sciatica, left side: Secondary | ICD-10-CM

## 2014-07-04 DIAGNOSIS — I10 Essential (primary) hypertension: Secondary | ICD-10-CM | POA: Diagnosis not present

## 2014-07-04 DIAGNOSIS — Q054 Unspecified spina bifida with hydrocephalus: Secondary | ICD-10-CM | POA: Insufficient documentation

## 2014-07-04 DIAGNOSIS — Z87891 Personal history of nicotine dependence: Secondary | ICD-10-CM | POA: Diagnosis not present

## 2014-07-04 DIAGNOSIS — M5441 Lumbago with sciatica, right side: Secondary | ICD-10-CM

## 2014-07-04 DIAGNOSIS — Z8669 Personal history of other diseases of the nervous system and sense organs: Secondary | ICD-10-CM | POA: Diagnosis not present

## 2014-07-04 DIAGNOSIS — M545 Low back pain: Secondary | ICD-10-CM | POA: Diagnosis present

## 2014-07-04 DIAGNOSIS — I5042 Chronic combined systolic (congestive) and diastolic (congestive) heart failure: Secondary | ICD-10-CM | POA: Diagnosis not present

## 2014-07-04 DIAGNOSIS — Z79899 Other long term (current) drug therapy: Secondary | ICD-10-CM | POA: Diagnosis not present

## 2014-07-04 MED ORDER — TRAMADOL HCL 50 MG PO TABS: 50.0000 mg | ORAL_TABLET | Freq: Four times a day (QID) | ORAL | Status: DC | PRN

## 2014-07-04 MED ORDER — CYCLOBENZAPRINE HCL 10 MG PO TABS: 10.0000 mg | ORAL_TABLET | Freq: Two times a day (BID) | ORAL | Status: DC | PRN

## 2014-07-04 NOTE — ED Notes (Signed)
Patient states low back pain that radiates to bilateral legs that started a couple of days ago.  Patient denies injury.   Patient states no history of low back pain.   Patient states she took ibuprofen 600 mg at home with no relief last night.

## 2014-07-04 NOTE — Discharge Instructions (Signed)
Sciatica °Sciatica is pain, weakness, numbness, or tingling along the path of the sciatic nerve. The nerve starts in the lower back and runs down the back of each leg. The nerve controls the muscles in the lower leg and in the back of the knee, while also providing sensation to the back of the thigh, lower leg, and the sole of your foot. Sciatica is a symptom of another medical condition. For instance, nerve damage or certain conditions, such as a herniated disk or bone spur on the spine, pinch or put pressure on the sciatic nerve. This causes the pain, weakness, or other sensations normally associated with sciatica. Generally, sciatica only affects one side of the body. °CAUSES  °· Herniated or slipped disc. °· Degenerative disk disease. °· A pain disorder involving the narrow muscle in the buttocks (piriformis syndrome). °· Pelvic injury or fracture. °· Pregnancy. °· Tumor (rare). °SYMPTOMS  °Symptoms can vary from mild to very severe. The symptoms usually travel from the low back to the buttocks and down the back of the leg. Symptoms can include: °· Mild tingling or dull aches in the lower back, leg, or hip. °· Numbness in the back of the calf or sole of the foot. °· Burning sensations in the lower back, leg, or hip. °· Sharp pains in the lower back, leg, or hip. °· Leg weakness. °· Severe back pain inhibiting movement. °These symptoms may get worse with coughing, sneezing, laughing, or prolonged sitting or standing. Also, being overweight may worsen symptoms. °DIAGNOSIS  °Your caregiver will perform a physical exam to look for common symptoms of sciatica. He or she may ask you to do certain movements or activities that would trigger sciatic nerve pain. Other tests may be performed to find the cause of the sciatica. These may include: °· Blood tests. °· X-rays. °· Imaging tests, such as an MRI or CT scan. °TREATMENT  °Treatment is directed at the cause of the sciatic pain. Sometimes, treatment is not necessary  and the pain and discomfort goes away on its own. If treatment is needed, your caregiver may suggest: °· Over-the-counter medicines to relieve pain. °· Prescription medicines, such as anti-inflammatory medicine, muscle relaxants, or narcotics. °· Applying heat or ice to the painful area. °· Steroid injections to lessen pain, irritation, and inflammation around the nerve. °· Reducing activity during periods of pain. °· Exercising and stretching to strengthen your abdomen and improve flexibility of your spine. Your caregiver may suggest losing weight if the extra weight makes the back pain worse. °· Physical therapy. °· Surgery to eliminate what is pressing or pinching the nerve, such as a bone spur or part of a herniated disk. °HOME CARE INSTRUCTIONS  °· Only take over-the-counter or prescription medicines for pain or discomfort as directed by your caregiver. °· Apply ice to the affected area for 20 minutes, 3-4 times a day for the first 48-72 hours. Then try heat in the same way. °· Exercise, stretch, or perform your usual activities if these do not aggravate your pain. °· Attend physical therapy sessions as directed by your caregiver. °· Keep all follow-up appointments as directed by your caregiver. °· Do not wear high heels or shoes that do not provide proper support. °· Check your mattress to see if it is too soft. A firm mattress may lessen your pain and discomfort. °SEEK IMMEDIATE MEDICAL CARE IF:  °· You lose control of your bowel or bladder (incontinence). °· You have increasing weakness in the lower back, pelvis, buttocks,   or legs. °· You have redness or swelling of your back. °· You have a burning sensation when you urinate. °· You have pain that gets worse when you lie down or awakens you at night. °· Your pain is worse than you have experienced in the past. °· Your pain is lasting longer than 4 weeks. °· You are suddenly losing weight without reason. °MAKE SURE YOU: °· Understand these  instructions. °· Will watch your condition. °· Will get help right away if you are not doing well or get worse. °Document Released: 05/04/2001 Document Revised: 11/09/2011 Document Reviewed: 09/19/2011 °ExitCare® Patient Information ©2015 [employer]. This information is not intended to replace advice given to you by your health care provider. Make sure you discuss any questions you have with your health care provider. ° °Back Exercises °Back exercises help treat and prevent back injuries. The goal of back exercises is to increase the strength of your abdominal and back muscles and the flexibility of your back. These exercises should be started when you no longer have back pain. Back exercises include: °· Pelvic Tilt. Lie on your back with your knees bent. Tilt your pelvis until the lower part of your back is against the floor. Hold this position 5 to 10 sec and repeat 5 to 10 times. °· Knee to Chest. Pull first 1 knee up against your chest and hold for 20 to 30 seconds, repeat this with the other knee, and then both knees. This may be done with the other leg straight or bent, whichever feels better. °· Sit-Ups or Curl-Ups. Bend your knees 90 degrees. Start with tilting your pelvis, and do a partial, slow sit-up, lifting your trunk only 30 to 45 degrees off the floor. Take at least 2 to 3 seconds for each sit-up. Do not do sit-ups with your knees out straight. If partial sit-ups are difficult, simply do the above but with only tightening your abdominal muscles and holding it as directed. °· Hip-Lift. Lie on your back with your knees flexed 90 degrees. Push down with your feet and shoulders as you raise your hips a couple inches off the floor; hold for 10 seconds, repeat 5 to 10 times. °· Back arches. Lie on your stomach, propping yourself up on bent elbows. Slowly press on your hands, causing an arch in your low back. Repeat 3 to 5 times. Any initial stiffness and discomfort should lessen with repetition over  time. °· Shoulder-Lifts. Lie face down with arms beside your body. Keep hips and torso pressed to floor as you slowly lift your head and shoulders off the floor. °Do not overdo your exercises, especially in the beginning. Exercises may cause you some mild back discomfort which lasts for a few minutes; however, if the pain is more severe, or lasts for more than 15 minutes, do not continue exercises until you see your caregiver. Improvement with exercise therapy for back problems is slow.  °See your caregivers for assistance with developing a proper back exercise program. °Document Released: 06/17/2004 Document Revised: 08/02/2011 Document Reviewed: 03/11/2011 °ExitCare® Patient Information ©2015 [employer]. This information is not intended to replace advice given to you by your health care provider. Make sure you discuss any questions you have with your health care provider. ° °

## 2014-07-04 NOTE — ED Provider Notes (Signed)
CSN: [id number]     Arrival date & time 07/04/14  0756 History   First MD Initiated Contact with Patient 07/04/14 [phone]     Chief Complaint  Patient presents with  . Back Pain     (Consider location/radiation/quality/duration/timing/severity/associated sxs/prior Treatment) HPI Chelsea Branch is a 49 year old female who presents the ER complaining of lower back pain. Patient reports for the past 5 days she has experienced bilateral pain in her lumbar region which radiates into her legs bilaterally. Patient reports the pain is worse with ambulation and with movement. Patient states she has had low back pain in the past which she describes as "muscle spasms". Patient states this pain she has been experiencing feels identical. Patient denies numbness, weakness, saddle anesthesia, bowel/bladder incontinence/retention, history of IV drug use or cancer.  Past Medical History  Diagnosis Date  . MITRAL REGURGITATION 10/15/2008  . 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 Alopecia   . Nonischemic cardiomyopathy     iniatially presumed 2/2 peripartum with improvement and then worsening - EF 25-30% by echo 07/2013.  Marland Kitchen Noncompliance   . Tobacco abuse   . Fibroids   . Abnormal uterine bleeding   . Menorrhagia   . Peripartum cardiomyopathy   . DYSLIPIDEMIA 06/08/2010  . Chronic combined systolic and diastolic CHF (congestive heart failure) 1994    a. 07/2013  EF of 25-30%, LV, mod LVH, diffuse hypokinesis and mild MR  . Hypertension 1994  . Stroke 1994  . Sleep apnea 2015    CPAP 12/2013   Past Surgical History  Procedure Laterality Date  . Loop recorder implant  ~ 2000  . Loop recorder explant    . Cesarean section  1992  1994  . Tubal ligation  1994   Family History  Problem Relation Age of Onset  . Other Neg Hx   . Heart disease Neg Hx   . Cancer Maternal Grandmother     uterine  . Hypertension Sister    History  Substance Use Topics  . Smoking status: Former Smoker -- 0.12 packs/day for  30 years    Types: Cigarettes    Quit date: 05/24/2010  . Smokeless tobacco: Never Used  . Alcohol Use: No   OB History    Gravida Para Term Preterm AB TAB SAB Ectopic Multiple Living   2 2 2      1 3      Review of Systems  Musculoskeletal: Positive for arthralgias.  Neurological: Negative for weakness and numbness.      Allergies  Ace inhibitors  Home Medications   Prior to Admission medications   Medication Sig Start Date End Date Taking? Authorizing Provider  Biotin 1 MG CAPS Take 1 mg by mouth daily. 01/29/14   Josalyn C Funches, MD  cyclobenzaprine (FLEXERIL) 10 MG tablet Take 1 tablet (10 mg total) by mouth 2 (two) times daily as needed for muscle spasms. 07/04/14   Carrie Mew, PA-C  furosemide (LASIX) 40 MG tablet Take 1 tablet (40 mg total) by mouth daily. 01/15/14   Deboraha Sprang, MD  hydrALAZINE (APRESOLINE) 25 MG tablet Take 1 tablet (25 mg total) by mouth 2 (two) times daily. 09/03/13 09/03/14  Reyne Dumas, MD  ibuprofen (ADVIL,MOTRIN) 800 MG tablet Take 1 tablet (800 mg total) by mouth every 8 (eight) hours as needed. 06/21/14   Woodroe Mode, MD  losartan (COZAAR) 25 MG tablet Take 1 tablet (25 mg total) by mouth 2 (two) times daily. Patient not taking: Reported on 06/21/2014  09/06/13   Deboraha Sprang, MD  megestrol (MEGACE) 40 MG tablet Take 1 tablet (40 mg total) by mouth 2 (two) times daily. 05/30/14   Peggy Constant, MD  metoprolol succinate (TOPROL-XL) 25 MG 24 hr tablet Take 1 tablet (25 mg total) by mouth daily. 03/12/14   Liliane Shi, PA-C  omeprazole (PRILOSEC) 20 MG capsule Take 20 mg by mouth daily.    Historical Provider, MD  Potassium Chloride ER 20 MEQ TBCR TAKE 1 TABLET BY MOUTH 2 TIMES DAILY. 06/06/14   Deboraha Sprang, MD  simvastatin (ZOCOR) 10 MG tablet Take 10 mg by mouth daily.    Historical Provider, MD  traMADol (ULTRAM) 50 MG tablet Take 1 tablet (50 mg total) by mouth every 6 (six) hours as needed. 07/04/14   Carrie Mew, PA-C    traMADol-acetaminophen (ULTRACET) 37.5-325 MG per tablet Take 1 tablet by mouth every 6 (six) hours as needed. 06/21/14   Woodroe Mode, MD  Vitamin D, Ergocalciferol, (DRISDOL) 50000 UNITS CAPS capsule Take 50,000 Units by mouth every 7 (seven) days. Take 1 every Saturday    Historical Provider, MD   BP 139/83 mmHg  Pulse 85  Temp(Src) 97.9 F (36.6 C) (Oral)  Resp 18  SpO2 100%  LMP 05/13/2014 Physical Exam  Constitutional: She appears well-developed and well-nourished. No distress.  HENT:  Head: Normocephalic and atraumatic.  Eyes: Conjunctivae are normal. Right eye exhibits no discharge. Left eye exhibits no discharge. No scleral icterus.  Cardiovascular:  Peripheral pulses intact at injured extremity.   Pulmonary/Chest: Effort normal. No respiratory distress.  Musculoskeletal:       Lumbar back: She exhibits tenderness and pain. She exhibits normal range of motion, no swelling, no edema and no deformity.       Back:  Neurological: She is alert. She has normal strength. No cranial nerve deficit or sensory deficit. She displays a negative Romberg sign. Gait normal. GCS eye subscore is 4. GCS verbal subscore is 5. GCS motor subscore is 6.  Patient fully alert, answering questions appropriately in full, clear sentences. Cranial nerves II through XII grossly intact. Motor strength 5 out of 5 in all major muscle groups of upper and lower extremity. Distal sensation intact.  Skin: Skin is warm and dry. No rash noted. She is not diaphoretic.  Nursing note and vitals reviewed.   ED Course  Procedures (including critical care time) Labs Review Labs Reviewed - No data to display  Imaging Review No results found.   EKG Interpretation None      MDM   Final diagnoses:  Bilateral low back pain with sciatica, sciatica laterality unspecified    Patient with back pain.  No neurological deficits and normal neuro exam.  Patient can walk but states is painful.  No loss of bowel or  bladder control.  No concern for cauda equina.  No fever, night sweats, weight loss, h/o cancer, IVDU.  RICE protocol and pain medicine indicated and discussed with patient. Based on patient's past medical history of hypertension and congestive heart failure, did not recommend prescription strength ibuprofen, recommended use of Tylenol, Ultram, muscle relaxer and back exercises. I encouraged patient to follow up with her primary care doctor, and discussed return precautions with patient. Patient verbalizes understanding and agreement of this plan. I encouraged patient to call or return to ER should she have any questions or concerns.  BP 139/83 mmHg  Pulse 85  Temp(Src) 97.9 F (36.6 C) (Oral)  Resp 18  SpO2 100%  LMP 05/13/2014  Signed,  Dahlia Bailiff, PA-C 9:09 AM       Carrie Mew, PA-C 07/04/14 [id number]  Evelina Bucy, MD 07/05/14 [phone]

## 2014-07-18 ENCOUNTER — Other Ambulatory Visit: Payer: Self-pay | Admitting: Internal Medicine

## 2014-07-21 ENCOUNTER — Emergency Department (HOSPITAL_COMMUNITY): Payer: Medicaid Other

## 2014-07-21 ENCOUNTER — Emergency Department (HOSPITAL_COMMUNITY)
Admission: EM | Admit: 2014-07-21 | Discharge: 2014-07-21 | Disposition: A | Discharge status: Home / Self Care | Payer: Medicaid Other | Attending: Emergency Medicine | Admitting: Emergency Medicine

## 2014-07-21 ENCOUNTER — Encounter (HOSPITAL_COMMUNITY): Payer: Self-pay | Admitting: Emergency Medicine

## 2014-07-21 DIAGNOSIS — Z87891 Personal history of nicotine dependence: Secondary | ICD-10-CM | POA: Insufficient documentation

## 2014-07-21 DIAGNOSIS — I10 Essential (primary) hypertension: Secondary | ICD-10-CM | POA: Insufficient documentation

## 2014-07-21 DIAGNOSIS — I5042 Chronic combined systolic (congestive) and diastolic (congestive) heart failure: Secondary | ICD-10-CM | POA: Insufficient documentation

## 2014-07-21 DIAGNOSIS — H6122 Impacted cerumen, left ear: Secondary | ICD-10-CM | POA: Diagnosis not present

## 2014-07-21 DIAGNOSIS — E876 Hypokalemia: Secondary | ICD-10-CM | POA: Diagnosis not present

## 2014-07-21 DIAGNOSIS — G4730 Sleep apnea, unspecified: Secondary | ICD-10-CM | POA: Insufficient documentation

## 2014-07-21 DIAGNOSIS — H7092 Unspecified mastoiditis, left ear: Secondary | ICD-10-CM | POA: Diagnosis not present

## 2014-07-21 DIAGNOSIS — H66002 Acute suppurative otitis media without spontaneous rupture of ear drum, left ear: Secondary | ICD-10-CM

## 2014-07-21 DIAGNOSIS — Z8742 Personal history of other diseases of the female genital tract: Secondary | ICD-10-CM | POA: Insufficient documentation

## 2014-07-21 DIAGNOSIS — Z792 Long term (current) use of antibiotics: Secondary | ICD-10-CM | POA: Insufficient documentation

## 2014-07-21 DIAGNOSIS — Z79899 Other long term (current) drug therapy: Secondary | ICD-10-CM | POA: Insufficient documentation

## 2014-07-21 DIAGNOSIS — Z9981 Dependence on supplemental oxygen: Secondary | ICD-10-CM | POA: Diagnosis not present

## 2014-07-21 DIAGNOSIS — Z8673 Personal history of transient ischemic attack (TIA), and cerebral infarction without residual deficits: Secondary | ICD-10-CM | POA: Insufficient documentation

## 2014-07-21 DIAGNOSIS — E785 Hyperlipidemia, unspecified: Secondary | ICD-10-CM | POA: Insufficient documentation

## 2014-07-21 DIAGNOSIS — H9202 Otalgia, left ear: Secondary | ICD-10-CM | POA: Diagnosis present

## 2014-07-21 LAB — CBC WITH DIFFERENTIAL/PLATELET
BASOS PCT: 0 % (ref 0–1)
Basophils Absolute: 0 10*3/uL (ref 0.0–0.1)
EOS ABS: 0.1 10*3/uL (ref 0.0–0.7)
Eosinophils Relative: 2 % (ref 0–5)
HEMATOCRIT: 41.6 % (ref 36.0–46.0)
HEMOGLOBIN: 13.8 g/dL (ref 12.0–15.0)
Lymphocytes Relative: 17 % (ref 12–46)
Lymphs Abs: 1 10*3/uL (ref 0.7–4.0)
MCH: 28 pg (ref 26.0–34.0)
MCHC: 33.2 g/dL (ref 30.0–36.0)
MCV: 84.6 fL (ref 78.0–100.0)
Monocytes Absolute: 0.2 10*3/uL (ref 0.1–1.0)
Monocytes Relative: 4 % (ref 3–12)
Neutro Abs: 4.4 10*3/uL (ref 1.7–7.7)
Neutrophils Relative %: 77 % (ref 43–77)
PLATELETS: 236 10*3/uL (ref 150–400)
RBC: 4.92 MIL/uL (ref 3.87–5.11)
RDW: 13.2 % (ref 11.5–15.5)
WBC: 5.8 10*3/uL (ref 4.0–10.5)

## 2014-07-21 LAB — COMPREHENSIVE METABOLIC PANEL
ALT: 14 U/L (ref 0–35)
ANION GAP: 8 (ref 5–15)
AST: 19 U/L (ref 0–37)
Albumin: 2.8 g/dL — ABNORMAL LOW (ref 3.5–5.2)
Alkaline Phosphatase: 78 U/L (ref 39–117)
BILIRUBIN TOTAL: 0.5 mg/dL (ref 0.3–1.2)
CHLORIDE: 106 mmol/L (ref 96–112)
CO2: 25 mmol/L (ref 19–32)
Calcium: 8.6 mg/dL (ref 8.4–10.5)
Creatinine, Ser: 0.72 mg/dL (ref 0.50–1.10)
GFR calc Af Amer: >90 mL/min (ref >90)
GLUCOSE: 130 mg/dL — AB (ref 70–99)
POTASSIUM: 2.6 mmol/L — AB (ref 3.5–5.1)
Sodium: 139 mmol/L (ref 135–145)
TOTAL PROTEIN: 6.5 g/dL (ref 6.0–8.3)

## 2014-07-21 LAB — LIPASE, BLOOD: Lipase: 25 U/L (ref 11–59)

## 2014-07-21 LAB — TROPONIN I: Troponin I: <0.03 ng/mL (ref <0.031)

## 2014-07-21 MED ORDER — HYDROCODONE-ACETAMINOPHEN 5-325 MG PO TABS: 1.0000 | ORAL_TABLET | ORAL | Status: DC | PRN

## 2014-07-21 MED ORDER — POTASSIUM CHLORIDE CRYS ER 20 MEQ PO TBCR
40.0000 meq | EXTENDED_RELEASE_TABLET | Freq: Once | ORAL | Status: AC
  Administered 2014-07-21: 40 meq via ORAL
  Filled 2014-07-21: qty 2

## 2014-07-21 MED ORDER — AMOXICILLIN-POT CLAVULANATE 875-125 MG PO TABS: 1.0000 | ORAL_TABLET | Freq: Two times a day (BID) | ORAL | Status: DC

## 2014-07-21 MED ORDER — HYDROCODONE-ACETAMINOPHEN 5-325 MG PO TABS
2.0000 | ORAL_TABLET | Freq: Once | ORAL | Status: AC
  Administered 2014-07-21: 2 via ORAL
  Filled 2014-07-21: qty 2

## 2014-07-21 MED ORDER — CIPROFLOXACIN-DEXAMETHASONE 0.3-0.1 % OT SUSP
4.0000 [drp] | Freq: Two times a day (BID) | OTIC | Status: DC
  Administered 2014-07-21: 4 [drp] via OTIC
  Filled 2014-07-21: qty 7.5

## 2014-07-21 MED ORDER — AMOXICILLIN-POT CLAVULANATE 875-125 MG PO TABS
1.0000 | ORAL_TABLET | Freq: Once | ORAL | Status: AC
  Administered 2014-07-21: 1 via ORAL
  Filled 2014-07-21: qty 1

## 2014-07-21 NOTE — ED Notes (Signed)
Pt. Stated, I started having some left side numbness and a ear pain and can't hear.  lAST NIGHT had diarrhea.

## 2014-07-21 NOTE — ED Notes (Signed)
EDP notified of critical potassium of 2.6

## 2014-07-21 NOTE — ED Provider Notes (Signed)
CSN: [id number]     Arrival date & time 07/21/14  0744 History   First MD Initiated Contact with Patient 07/21/14 [id number]     Chief Complaint  Patient presents with  . Numbness  . Otalgia  . Diarrhea     (Consider location/radiation/quality/duration/timing/severity/associated sxs/prior Treatment) HPI The patient reports that she's had hearing loss in her left ear and numbness on the left side of her head, upper shoulder and upper chest for about a week. She denies any symptoms in her lower extremity. She reports shooting pains in the left ear as well. She has not had any difficulty with her gait. She has not had any confusion or visual changes. The patient reports that she has had diarrhea but doesn't quantify how much. She denies fever or sinus symptoms. She denies she's had nasal drainage or discharge. She denies dental pain or difficulty swallowing. Past Medical History  Diagnosis Date  . MITRAL REGURGITATION 10/15/2008  . 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 Alopecia   . Nonischemic cardiomyopathy     iniatially presumed 2/2 peripartum with improvement and then worsening - EF 25-30% by echo 07/2013.  Marland Kitchen Noncompliance   . Tobacco abuse   . Fibroids   . Abnormal uterine bleeding   . Menorrhagia   . Peripartum cardiomyopathy   . DYSLIPIDEMIA 06/08/2010  . Chronic combined systolic and diastolic CHF (congestive heart failure) 1994    a. 07/2013  EF of 25-30%, LV, mod LVH, diffuse hypokinesis and mild MR  . Hypertension 1994  . Stroke 1994  . Sleep apnea 2015    CPAP 12/2013   Past Surgical History  Procedure Laterality Date  . Loop recorder implant  ~ 2000  . Loop recorder explant    . Cesarean section  1992  1994  . Tubal ligation  1994   Family History  Problem Relation Age of Onset  . Other Neg Hx   . Heart disease Neg Hx   . Cancer Maternal Grandmother     uterine  . Hypertension Sister    History  Substance Use Topics  . Smoking status: Former Smoker -- 0.12  packs/day for 30 years    Types: Cigarettes    Quit date: 05/24/2010  . Smokeless tobacco: Never Used  . Alcohol Use: No   OB History    Gravida Para Term Preterm AB TAB SAB Ectopic Multiple Living   2 2 2      1 3      Review of Systems  10 Systems reviewed and are negative for acute change except as noted in the HPI.   Allergies  Ace inhibitors  Home Medications   Prior to Admission medications   Medication Sig Start Date End Date Taking? Authorizing Provider  Biotin 1 MG CAPS Take 1 mg by mouth daily. 01/29/14  Yes Josalyn C Funches, MD  cyclobenzaprine (FLEXERIL) 10 MG tablet Take 1 tablet (10 mg total) by mouth 2 (two) times daily as needed for muscle spasms. 07/04/14  Yes Carrie Mew, PA-C  furosemide (LASIX) 40 MG tablet Take 1 tablet (40 mg total) by mouth daily. 01/15/14  Yes Deboraha Sprang, MD  hydrALAZINE (APRESOLINE) 25 MG tablet TAKE ONE TABLET BY MOUTH TWICE DAILY 07/18/14  Yes Deboraha Sprang, MD  ibuprofen (ADVIL,MOTRIN) 800 MG tablet Take 1 tablet (800 mg total) by mouth every 8 (eight) hours as needed. 06/21/14  Yes Woodroe Mode, MD  losartan (COZAAR) 25 MG tablet Take 1 tablet (25 mg total) by  mouth 2 (two) times daily. 09/06/13  Yes Deboraha Sprang, MD  megestrol (MEGACE) 40 MG tablet Take 1 tablet (40 mg total) by mouth 2 (two) times daily. Patient taking differently: Take 40 mg by mouth daily.  05/30/14  Yes Peggy Constant, MD  metoprolol succinate (TOPROL-XL) 25 MG 24 hr tablet Take 1 tablet (25 mg total) by mouth daily. 03/12/14  Yes Scott Joylene Draft, PA-C  omeprazole (PRILOSEC) 20 MG capsule Take 20 mg by mouth daily.   Yes Historical Provider, MD  Potassium Chloride ER 20 MEQ TBCR TAKE 1 TABLET BY MOUTH 2 TIMES DAILY. 06/06/14  Yes Deboraha Sprang, MD  simvastatin (ZOCOR) 10 MG tablet Take 10 mg by mouth daily.   Yes Historical Provider, MD  traMADol (ULTRAM) 50 MG tablet Take 1 tablet (50 mg total) by mouth every 6 (six) hours as needed. 07/04/14  Yes Carrie Mew,  PA-C  amoxicillin-clavulanate (AUGMENTIN) 875-125 MG per tablet Take 1 tablet by mouth 2 (two) times daily. One po bid x 7 days 07/21/14   Charlesetta Shanks, MD  HYDROcodone-acetaminophen (NORCO/VICODIN) 5-325 MG per tablet Take 1-2 tablets by mouth every 4 (four) hours as needed for moderate pain or severe pain. 07/21/14   Charlesetta Shanks, MD  traMADol-acetaminophen (ULTRACET) 37.5-325 MG per tablet Take 1 tablet by mouth every 6 (six) hours as needed. Patient not taking: Reported on 07/21/2014 06/21/14   Woodroe Mode, MD   BP 146/87 mmHg  Pulse 73  Temp(Src) 98.9 F (37.2 C) (Oral)  Resp 18  Ht 5\' 8"  (1.727 m)  Wt 200 lb (90.719 kg)  BMI 30.42 kg/m2  SpO2 99%  LMP 05/13/2014 Physical Exam  Constitutional: She is oriented to person, place, and time. She appears well-developed and well-nourished.  HENT:  Head: Normocephalic and atraumatic.  Nose: Nose normal.  Mouth/Throat: Oropharynx is clear and moist. No oropharyngeal exudate.  Left TM has cerumen obscuring most of the TM. There is a 1-2 mm view of the TM. The canal is otherwise nonedematous in the pinna and external ear are normal. Right ear has a small amount of cerumen as well. Again external canal and pinna normal.  Eyes: EOM are normal. Pupils are equal, round, and reactive to light.  Neck: Neck supple.  Cardiovascular: Normal rate, regular rhythm, normal heart sounds and intact distal pulses.   Pulmonary/Chest: Effort normal and breath sounds normal.  Abdominal: Soft. Bowel sounds are normal. She exhibits no distension. There is no tenderness.  Musculoskeletal: Normal range of motion. She exhibits no edema or tenderness.  Neurological: She is alert and oriented to person, place, and time. She has normal strength. She displays normal reflexes. No cranial nerve deficit. She exhibits normal muscle tone. Coordination normal. GCS eye subscore is 4. GCS verbal subscore is 5. GCS motor subscore is 6.  Skin: Skin is warm, dry and intact.   No rash or erythema. Skin of the face is normal.  Psychiatric: She has a normal mood and affect.   After irrigation by nursing staff the left TM is reexamined. Cerumen is still present however a better visualization shows irregular, surface of the drum with retraction and waxy fibrinous material adherent to it. There is a small area that I believe to be an irregularity in that the material adherent to the drum and not an actual perforation of the drum, there is no exudate or fluid coming out of this area or pooling in the ear canal. All of the identified material is of  a semisolid nature and does not look like it has been exudate exudating from the middle ear.  ED Course  Procedures (including critical care time) Labs Review Labs Reviewed  COMPREHENSIVE METABOLIC PANEL - Abnormal; Notable for the following:    Potassium 2.6 (*)    Glucose, Bld 130 (*)    BUN <5 (*)    Albumin 2.8 (*)    All other components within normal limits  LIPASE, BLOOD  TROPONIN I  CBC WITH DIFFERENTIAL/PLATELET    Imaging Review Ct Head Wo Contrast  07/21/2014   CLINICAL DATA:  Left-sided numbness, left ear pain, hearing loss, dizziness  EXAM: CT HEAD WITHOUT CONTRAST  TECHNIQUE: Contiguous axial images were obtained from the base of the skull through the vertex without intravenous contrast.  COMPARISON:  MRI brain dated 05/18/2010  FINDINGS: No evidence of parenchymal hemorrhage or extra-axial fluid collection. No mass lesion, mass effect, or midline shift.  No CT evidence of acute infarction.  Cerebral volume is within normal limits.  No ventriculomegaly.  Again noted is crowding of the cerebellar tonsils at the foramen magnum, compatible with known Chiari 1 malformation.  Visualized paranasal sinuses are clear.  Left mastoid effusion with additional fluid in the middle ear. No evidence of osseous destruction or obstructing mass in the nasal antrum.  No evidence of calvarial fracture.  IMPRESSION: No evidence of  acute intracranial abnormality.  Left mastoid effusion with fluid in the middle ear.   Electronically Signed   By: Julian Hy M.D.   On: 07/21/2014 10:30     EKG Interpretation None      MDM   Final diagnoses:  Mastoiditis, left  Acute suppurative otitis media of left ear without spontaneous rupture of tympanic membrane, recurrence not specified  Hypokalemia   The patient's chronic hypokalemia and reports she's been out of her potassium for a month. She reports that she has a prescription at the pharmacy that she has to pick up and it was supposed to been called in by her primary care doctor. The patient has over a weeks worth of ear symptoms and at this point based on CT findings that appears there is fluid within the mastoid air cells. She does not have erythema or swelling to suggest a severe mastoiditis and the CT does not show any bony erosion or inflammation. At this time I do feel she is a candidate for outpatient treatment with ENT follow-up and will start her on a Ciprodex and Augmentin. The patient will obtain her potassium as prescribed.    Charlesetta Shanks, MD 07/21/14 [phone]

## 2014-07-21 NOTE — Discharge Instructions (Signed)
Hypokalemia Hypokalemia means that the amount of potassium in the blood is lower than normal.Potassium is a chemical, called an electrolyte, that helps regulate the amount of fluid in the body. It also stimulates muscle contraction and helps nerves function properly.Most of the body's potassium is inside of cells, and only a very small amount is in the blood. Because the amount in the blood is so small, minor changes can be life-threatening. CAUSES  Antibiotics.  Diarrhea or vomiting.  Using laxatives too much, which can cause diarrhea.  Chronic kidney disease.  Water pills (diuretics).  Eating disorders (bulimia).  Low magnesium level.  Sweating a lot. SIGNS AND SYMPTOMS  Weakness.  Constipation.  Fatigue.  Muscle cramps.  Mental confusion.  Skipped heartbeats or irregular heartbeat (palpitations).  Tingling or numbness. DIAGNOSIS  Your health care provider can diagnose hypokalemia with blood tests. In addition to checking your potassium level, your health care provider may also check other lab tests. TREATMENT Hypokalemia can be treated with potassium supplements taken by mouth or adjustments in your current medicines. If your potassium level is very low, you may need to get potassium through a vein (IV) and be monitored in the hospital. A diet high in potassium is also helpful. Foods high in potassium are:  Nuts, such as peanuts and pistachios.  Seeds, such as sunflower seeds and pumpkin seeds.  Peas, lentils, and lima beans.  Whole grain and bran cereals and breads.  Fresh fruit and vegetables, such as apricots, avocado, bananas, cantaloupe, kiwi, oranges, tomatoes, asparagus, and potatoes.  Orange and tomato juices.  Red meats.  Fruit yogurt. HOME CARE INSTRUCTIONS  Take all medicines as prescribed by your health care provider.  Maintain a healthy diet by including nutritious food, such as fruits, vegetables, nuts, whole grains, and lean meats.  If  you are taking a laxative, be sure to follow the directions on the label. SEEK MEDICAL CARE IF:  Your weakness gets worse.  You feel your heart pounding or racing.  You are vomiting or having diarrhea.  You are diabetic and having trouble keeping your blood glucose in the normal range. SEEK IMMEDIATE MEDICAL CARE IF:  You have chest pain, shortness of breath, or dizziness.  You are vomiting or having diarrhea for more than 2 days.  You faint. MAKE SURE YOU:   Understand these instructions.  Will watch your condition.  Will get help right away if you are not doing well or get worse. Document Released: 05/10/2005 Document Revised: 02/28/2013 Document Reviewed: 11/10/2012 [facility] Patient Information 2015 [place], [place]. This information is not intended to replace advice given to you by your health care provider. Make sure you discuss any questions you have with your health care provider. Mastoiditis Mastoiditis is an infection that has spread from the middle ear to a bony area (the mastoid air cells) behind the middle ear. It is an uncommon complication of a middle ear infection. It occurs most often in young children. Treatment with the right antibiotics (medications that are used to treat bacteria germs) is generally effective. With the right treatment, there is a very high chance of full recovery.  SYMPTOMS  Some common symptoms of mastoiditis include:   Pain, swelling, redness, warmth, or a tender mass of the bone behind the ear.  Fever.  Fussiness and irritability.  Redness and swelling of the ear lobe or ear.  Ear drainage.  Headache. DIAGNOSIS  Your caregiver will make the diagnosis based on an exam and on questions about what you  or your child has been feeling. Other tests that may be done include:  Blood work.  Blood cultures or cultures of ear drainage.  X-rays.  If you or your child has symptoms that suggest more serious problems, additional tests and/or  specialized x-rays may be done. These could include:  CT (CAT) scans.  Magnetic Resonance Imaging (MRI). TREATMENT  Treatment will be based on how serious the infection is and what will be expected to give the best outcome. The treatment required may be:  Hospitalization and antibiotics given through a vein.  An operation (myringotomy) is sometimes done to relieve the pressure from the middle ear. This is a surgical procedure where a small hole is cut into the ear drum. A small tube is then placed to keep the hole open and draining. The tubes usually fall out on their own after 6 to 12 months.  In rare cases, if the above treatments do not work, more surgery may be necessary. This is called a mastoidectomy. RISKS AND COMPLICATIONS These are rare if proper treatment is started early. These can include:  Facial paralysis  Infection and possible destruction of the mastoid bone.  Spread of infection to the neck.  Hearing loss which can be partial or complete on the side of the infection.  Infection spread to the brain.  Clots or blockage of blood vessels in the neck or brain. HOME CARE INSTRUCTIONS   Take prescribed antibiotics and other medications as directed by your caregiver.  Follow-up with an exam by an ENT (Ear, Nose, and Throat) specialist if recommended.  A follow-up hearing test (audiogram) may be recommended. SEEK MEDICAL CARE IF:   You develop recurrent fevers of 100 F (37.8 C) or higher.  You develop new headache, ear, or facial pain that had not happened before.  You feel that there has been loss of hearing. SEEK IMMEDIATE MEDICAL CARE IF:   You develop fevers of 102 F (38.9 C).  You develop severe headache, ear, or facial pain.  You experience sudden hearing loss.  You develop repeated episodes of vomiting.  You develop weakness or drooping of one side of your face.  You experience weakness of one arm, one leg, or on one side of your body.  You  develop sudden problems with speech and/or vision. MAKE SURE YOU:   Understand these instructions.  Will watch your condition.  Will get help right away if you are not doing well or get worse. Document Released: 06/09/2006 Document Revised: 08/02/2011 Document Reviewed: 04/28/2007 [facility] Patient Information 2015 [employer], [place]. This information is not intended to replace advice given to you by your health care provider. Make sure you discuss any questions you have with your health care provider.

## 2014-07-21 NOTE — ED Notes (Signed)
Pt refusing IV at this time.

## 2014-07-21 NOTE — ED Notes (Signed)
EDP at bedside  

## 2014-08-14 ENCOUNTER — Telehealth: Payer: Self-pay | Admitting: *Deleted

## 2014-08-14 NOTE — Telephone Encounter (Signed)
-----   Message from Berton Mount sent at 08/05/2014  1:34 PM EDT ----- Janett Billow,  This is [place] from [place]. This patient has an appointment with Dr Gwenette Greet on 4/13.  Is there any way she can see Tammy instead?    Her account is up to over $700 and [employer] hasn't been paid.  I just need your help on this one, thanks!  Mandy at [place] cell  Call me with any questions, thanks!  IT is still working on my staff messages, so you may not be able to message be back:(

## 2014-08-14 NOTE — Telephone Encounter (Addendum)
ATC cell number (preferred contact number) and automated message stated pt is not receiving messages at this time LMOM TCB x1 at home number to scheduled ov w/ TP  Left detailed msg for Leafy Ro w/ [employer] informing her of the above

## 2014-08-22 NOTE — Telephone Encounter (Signed)
ATC x2 cell number (preferred contact number) and automated message stated pt is not receiving messages at this time LMOM TCB x2 at home number

## 2014-09-02 NOTE — Telephone Encounter (Signed)
Pt never returned call Touched base with Mandy from [place] whom had asked if [facility] would sign CMN if pt makes her appt with him Checked with Mindy/[facility] whom reported that yes, [place] will sign CMN if pt makes her appt  Called Richardine Service, liaison with [employer] - left detailed message that [facility] will sign CMN if pt keeps her appt and asked Leafy Ro where the CMN is (with [facility] or if it needs to be faxed).  Asked Leafy Ro to call me back or send me another staff message.  Message      Received: 4 days ago    [place], [place]; [place]!   I see that you have been trying to get this patient to come in and see Tammy Parrett. I know you have left several messages. My upper management said her account is now $1249.22. Worst case scenario, if she makes her 4/13 appointment with Dr Gwenette Greet, will Dr Gwenette Greet sign our Medicaid CMN? Thanks so much!   [employer]  [employer] Rep  [phone]

## 2014-09-03 ENCOUNTER — Other Ambulatory Visit: Payer: Self-pay | Admitting: Internal Medicine

## 2014-09-04 ENCOUNTER — Ambulatory Visit: Payer: Medicaid Other | Admitting: Pulmonary Disease

## 2014-09-05 ENCOUNTER — Other Ambulatory Visit: Payer: Self-pay

## 2014-09-05 MED ORDER — HYDRALAZINE HCL 25 MG PO TABS: 25.0000 mg | ORAL_TABLET | Freq: Two times a day (BID) | ORAL | Status: DC

## 2014-09-05 MED ORDER — SIMVASTATIN 10 MG PO TABS: 10.0000 mg | ORAL_TABLET | Freq: Every day | ORAL | Status: DC

## 2014-09-10 NOTE — Telephone Encounter (Signed)
Pt NS for 4.13.16 ov w/ KC and never returned call No upcoming appts within pulmonary Will sign off

## 2014-09-12 ENCOUNTER — Ambulatory Visit: Payer: Medicaid Other | Attending: Family Medicine | Admitting: Family Medicine

## 2014-09-12 ENCOUNTER — Encounter: Payer: Self-pay | Admitting: Family Medicine

## 2014-09-12 VITALS — BP 138/91 | HR 96 | Temp 99.3°F | Resp 18 | Ht 68.0 in | Wt 201.0 lb

## 2014-09-12 DIAGNOSIS — Z23 Encounter for immunization: Secondary | ICD-10-CM | POA: Diagnosis not present

## 2014-09-12 DIAGNOSIS — B353 Tinea pedis: Secondary | ICD-10-CM | POA: Diagnosis not present

## 2014-09-12 DIAGNOSIS — Z5900 Homelessness unspecified: Secondary | ICD-10-CM

## 2014-09-12 DIAGNOSIS — M545 Low back pain: Secondary | ICD-10-CM | POA: Diagnosis not present

## 2014-09-12 DIAGNOSIS — I5042 Chronic combined systolic (congestive) and diastolic (congestive) heart failure: Secondary | ICD-10-CM | POA: Diagnosis not present

## 2014-09-12 DIAGNOSIS — I10 Essential (primary) hypertension: Secondary | ICD-10-CM | POA: Diagnosis not present

## 2014-09-12 MED ORDER — TERBINAFINE HCL 1 % EX CREA: 1.0000 "application " | TOPICAL_CREAM | Freq: Two times a day (BID) | CUTANEOUS | Status: DC

## 2014-09-12 MED ORDER — KETOROLAC TROMETHAMINE 60 MG/2ML IM SOLN
60.0000 mg | Freq: Once | INTRAMUSCULAR | Status: AC
  Administered 2014-09-12: 60 mg via INTRAMUSCULAR

## 2014-09-12 MED ORDER — SIMVASTATIN 10 MG PO TABS: 10.0000 mg | ORAL_TABLET | Freq: Every day | ORAL | Status: DC

## 2014-09-12 MED ORDER — LOSARTAN POTASSIUM 50 MG PO TABS: 50.0000 mg | ORAL_TABLET | Freq: Every day | ORAL | Status: DC

## 2014-09-12 MED ORDER — METOPROLOL SUCCINATE ER 25 MG PO TB24: 25.0000 mg | ORAL_TABLET | Freq: Every day | ORAL | Status: DC

## 2014-09-12 NOTE — Progress Notes (Signed)
F/U medication  Stated need refills

## 2014-09-12 NOTE — Patient Instructions (Addendum)
Mrs. Loeffler,  Thank you for coming in today.  1. Low back pain. Sacroiliac pain. Shot of toradol today Stretches  2. Foot fungus: lamisil CBG today  3. You did have low potassium: BMP today. Continue lasix with potassium.  4. HTN: Losartan 50 mg once dialy Lasix 40 mg once daily toprol 25 mg once daily  Depressed mood due to lack of resource and current homelessness.   F/u with our social worker, [employer]. You can make an appt or call the number provided.  F/u with RN in 4 weeks for BP check  F/u with me in 2 months   Dr. Adrian Blackwater   Back Exercises Back exercises help treat and prevent back injuries. The goal is to increase your strength in your belly (abdominal) and back muscles. These exercises can also help with flexibility. Start these exercises when told by your doctor. HOME CARE Back exercises include: Pelvic Tilt.  Lie on your back with your knees bent. Tilt your pelvis until the lower part of your back is against the floor. Hold this position 5 to 10 sec. Repeat this exercise 5 to 10 times. Knee to Chest.  Pull 1 knee up against your chest and hold for 20 to 30 seconds. Repeat this with the other knee. This may be done with the other leg straight or bent, whichever feels better. Then, pull both knees up against your chest. Sit-Ups or Curl-Ups.  Bend your knees 90 degrees. Start with tilting your pelvis, and do a partial, slow sit-up. Only lift your upper half 30 to 45 degrees off the floor. Take at least 2 to 3 seonds for each sit-up. Do not do sit-ups with your knees out straight. If partial sit-ups are difficult, simply do the above but with only tightening your belly (abdominal) muscles and holding it as told. Hip-Lift.  Lie on your back with your knees flexed 90 degrees. Push down with your feet and shoulders as you raise your hips 2 inches off the floor. Hold for 10 seconds, repeat 5 to 10 times. Back Arches.  Lie on your stomach. Prop yourself up on bent  elbows. Slowly press on your hands, causing an arch in your low back. Repeat 3 to 5 times. Shoulder-Lifts.  Lie face down with arms beside your body. Keep hips and belly pressed to floor as you slowly lift your head and shoulders off the floor. Do not overdo your exercises. Be careful in the beginning. Exercises may cause you some mild back discomfort. If the pain lasts for more than 15 minutes, stop the exercises until you see your doctor. Improvement with exercise for back problems is slow.  Document Released: 06/12/2010 Document Revised: 08/02/2011 Document Reviewed: 03/11/2011 [facility] Patient Information 2015 [place], [place]. This information is not intended to replace advice given to you by your health care provider. Make sure you discuss any questions you have with your health care provider.

## 2014-09-13 ENCOUNTER — Telehealth: Payer: Self-pay | Admitting: Clinical

## 2014-09-13 ENCOUNTER — Telehealth: Payer: Self-pay | Admitting: *Deleted

## 2014-09-13 LAB — BASIC METABOLIC PANEL
BUN: 7 mg/dL (ref 6–23)
CHLORIDE: 104 meq/L (ref 96–112)
CO2: 28 mEq/L (ref 19–32)
Calcium: 8.8 mg/dL (ref 8.4–10.5)
Creat: 0.67 mg/dL (ref 0.50–1.10)
Glucose, Bld: 86 mg/dL (ref 70–99)
POTASSIUM: 3.9 meq/L (ref 3.5–5.3)
Sodium: 138 mEq/L (ref 135–145)

## 2014-09-13 NOTE — Telephone Encounter (Signed)
Returned pt call; she inquired about housing, [facility] referred her to [employer] for walk-in application

## 2014-09-13 NOTE — Telephone Encounter (Signed)
Left voice message to return call 

## 2014-09-13 NOTE — Telephone Encounter (Signed)
Pt left message to call her back. 

## 2014-09-13 NOTE — Telephone Encounter (Signed)
-----   Message from Boykin Nearing, MD sent at 09/13/2014  8:53 AM EDT ----- Normal BMP, normal potassium

## 2014-09-16 DIAGNOSIS — M545 Low back pain, unspecified: Secondary | ICD-10-CM | POA: Insufficient documentation

## 2014-09-16 DIAGNOSIS — Z5900 Homelessness unspecified: Secondary | ICD-10-CM | POA: Insufficient documentation

## 2014-09-16 NOTE — Assessment & Plan Note (Signed)
Low back pain. Sacroiliac pain. Shot of toradol today Stretches

## 2014-09-16 NOTE — Progress Notes (Signed)
   Subjective:    [name], female    DOB: 08-20-1965, 49 y.o.   MRN: [id number] CC: depressed mood currently homeless, HTN, foot rash  HPI  1. Depressed mood: patient is currently homeless. She and her sons have lost their apartment. She is frustrated and is having trouble navigating resources. She denies SI.  2. CHRONIC HYPERTENSION  Disease Monitoring  Blood pressure range: does not check   Chest pain: no   Dyspnea: no   Claudication: no   Medication compliance: yes  Medication Side Effects  Lightheadedness: no   Urinary frequency: no   Edema: no    Preventitive Healthcare:  Exercise: no   Diet Pattern: skips some meals   Salt Restriction: no  3. Foot rash: between toes on both feet. Mild itching  4 low back pain; persistent. Worse with prolonged standing or walking.   Soc hx: former smoker, quit   Review of Systems  Psychiatric/Behavioral: Positive for dysphoric mood. Negative for suicidal ideas. The patient is nervous/anxious.        Objective:   Physical Exam  BP 138/91 mmHg  Pulse 96  Temp(Src) 99.3 F (37.4 C) (Oral)  Resp 18  Ht 5\' 8"  (1.727 m)  Wt 201 lb (91.173 kg)  BMI 30.57 kg/m2  SpO2 98% General appearance: alert, cooperative and no distress Breasts: normal appearance, no masses or tenderness Heart: regular rate and rhythm, S1, S2 normal, no murmur, click, rub or gallop Extremities: maceration between toes   Back Exam: Back: Normal Curvature, no deformities or CVA tenderness  Paraspinal Tenderness: b./l SI joint   LE Strength 5/5  LE Sensation: in tact  LE Reflexes 2+ and symmetric  Straight leg raise: negative       Assessment & Plan:

## 2014-09-16 NOTE — Assessment & Plan Note (Addendum)
lamisil for tinea pedis  Normal blood sugar

## 2014-09-16 NOTE — Assessment & Plan Note (Signed)
Depressed mood due to lack of resource and current homelessness.   F/u with our social worker, [employer]. You can make an appt or call the number provided.

## 2014-09-16 NOTE — Assessment & Plan Note (Addendum)
A: HTN: BP high treated with clonidine in office Med: compliant with all except losartan today P: Losartan 50 mg once dialy Lasix 40 mg once daily toprol 25 mg once daily BMP today given recent low K+

## 2014-09-16 NOTE — Telephone Encounter (Signed)
LVM to return call x2  

## 2014-09-17 ENCOUNTER — Telehealth: Payer: Self-pay | Admitting: Clinical

## 2014-09-17 NOTE — Telephone Encounter (Signed)
Return call to pt; she plans to go to [facility] on Friday, 08/06/38 for housing application,  and is considering going to the [facility] for additonal resources

## 2014-09-24 ENCOUNTER — Ambulatory Visit: Payer: Medicaid Other | Admitting: Adult Health

## 2014-10-31 ENCOUNTER — Encounter (HOSPITAL_COMMUNITY): Payer: Self-pay | Admitting: Obstetrics & Gynecology

## 2014-11-02 ENCOUNTER — Emergency Department (HOSPITAL_COMMUNITY): Payer: Medicaid Other

## 2014-11-02 ENCOUNTER — Encounter (HOSPITAL_COMMUNITY): Payer: Self-pay | Admitting: Emergency Medicine

## 2014-11-02 ENCOUNTER — Emergency Department (HOSPITAL_COMMUNITY)
Admission: EM | Admit: 2014-11-02 | Discharge: 2014-11-02 | Disposition: A | Discharge status: Home / Self Care | Payer: Medicaid Other | Attending: Emergency Medicine | Admitting: Emergency Medicine

## 2014-11-02 DIAGNOSIS — E785 Hyperlipidemia, unspecified: Secondary | ICD-10-CM | POA: Diagnosis not present

## 2014-11-02 DIAGNOSIS — G4730 Sleep apnea, unspecified: Secondary | ICD-10-CM | POA: Diagnosis not present

## 2014-11-02 DIAGNOSIS — Z9119 Patient's noncompliance with other medical treatment and regimen: Secondary | ICD-10-CM | POA: Diagnosis not present

## 2014-11-02 DIAGNOSIS — Z86018 Personal history of other benign neoplasm: Secondary | ICD-10-CM | POA: Insufficient documentation

## 2014-11-02 DIAGNOSIS — Z8742 Personal history of other diseases of the female genital tract: Secondary | ICD-10-CM | POA: Diagnosis not present

## 2014-11-02 DIAGNOSIS — I5042 Chronic combined systolic (congestive) and diastolic (congestive) heart failure: Secondary | ICD-10-CM | POA: Insufficient documentation

## 2014-11-02 DIAGNOSIS — R059 Cough, unspecified: Secondary | ICD-10-CM

## 2014-11-02 DIAGNOSIS — I10 Essential (primary) hypertension: Secondary | ICD-10-CM | POA: Insufficient documentation

## 2014-11-02 DIAGNOSIS — Z8673 Personal history of transient ischemic attack (TIA), and cerebral infarction without residual deficits: Secondary | ICD-10-CM | POA: Diagnosis not present

## 2014-11-02 DIAGNOSIS — R05 Cough: Secondary | ICD-10-CM | POA: Diagnosis not present

## 2014-11-02 DIAGNOSIS — L630 Alopecia (capitis) totalis: Secondary | ICD-10-CM | POA: Insufficient documentation

## 2014-11-02 DIAGNOSIS — Z79899 Other long term (current) drug therapy: Secondary | ICD-10-CM | POA: Diagnosis not present

## 2014-11-02 DIAGNOSIS — Z87891 Personal history of nicotine dependence: Secondary | ICD-10-CM | POA: Diagnosis not present

## 2014-11-02 DIAGNOSIS — R079 Chest pain, unspecified: Secondary | ICD-10-CM | POA: Diagnosis not present

## 2014-11-02 DIAGNOSIS — Q054 Unspecified spina bifida with hydrocephalus: Secondary | ICD-10-CM | POA: Insufficient documentation

## 2014-11-02 DIAGNOSIS — R062 Wheezing: Secondary | ICD-10-CM | POA: Diagnosis not present

## 2014-11-02 LAB — I-STAT TROPONIN, ED: Troponin i, poc: 0 ng/mL (ref 0.00–0.08)

## 2014-11-02 LAB — CBC WITH DIFFERENTIAL/PLATELET
Basophils Absolute: 0 K/uL (ref 0.0–0.1)
Basophils Relative: 0 % (ref 0–1)
Eosinophils Absolute: 0.1 K/uL (ref 0.0–0.7)
Eosinophils Relative: 2 % (ref 0–5)
HCT: 39.5 % (ref 36.0–46.0)
Hemoglobin: 13.1 g/dL (ref 12.0–15.0)
Lymphocytes Relative: 48 % — ABNORMAL HIGH (ref 12–46)
Lymphs Abs: 2.2 K/uL (ref 0.7–4.0)
MCH: 27.3 pg (ref 26.0–34.0)
MCHC: 33.2 g/dL (ref 30.0–36.0)
MCV: 82.5 fL (ref 78.0–100.0)
Monocytes Absolute: 0.3 K/uL (ref 0.1–1.0)
Monocytes Relative: 6 % (ref 3–12)
Neutro Abs: 2 K/uL (ref 1.7–7.7)
Neutrophils Relative %: 44 % (ref 43–77)
Platelets: 221 K/uL (ref 150–400)
RBC: 4.79 MIL/uL (ref 3.87–5.11)
RDW: 14.3 % (ref 11.5–15.5)
WBC: 4.6 K/uL (ref 4.0–10.5)

## 2014-11-02 LAB — BASIC METABOLIC PANEL WITH GFR
Anion gap: 12 (ref 5–15)
BUN: 6 mg/dL (ref 6–20)
CO2: 22 mmol/L (ref 22–32)
Calcium: 8.7 mg/dL — ABNORMAL LOW (ref 8.9–10.3)
Chloride: 106 mmol/L (ref 101–111)
Creatinine, Ser: 0.91 mg/dL (ref 0.44–1.00)
GFR calc Af Amer: >60 mL/min
GFR calc non Af Amer: >60 mL/min
Glucose, Bld: 147 mg/dL — ABNORMAL HIGH (ref 65–99)
Potassium: 2.9 mmol/L — ABNORMAL LOW (ref 3.5–5.1)
Sodium: 140 mmol/L (ref 135–145)

## 2014-11-02 MED ORDER — ALBUTEROL SULFATE HFA 108 (90 BASE) MCG/ACT IN AERS
1.0000 | INHALATION_SPRAY | RESPIRATORY_TRACT | Status: DC | PRN
  Administered 2014-11-02: 2 via RESPIRATORY_TRACT
  Filled 2014-11-02: qty 6.7

## 2014-11-02 MED ORDER — ALBUTEROL SULFATE (2.5 MG/3ML) 0.083% IN NEBU
5.0000 mg | INHALATION_SOLUTION | Freq: Once | RESPIRATORY_TRACT | Status: AC
  Administered 2014-11-02: 5 mg via RESPIRATORY_TRACT
  Filled 2014-11-02: qty 6

## 2014-11-02 MED ORDER — HYDROCOD POLST-CPM POLST ER 10-8 MG/5ML PO SUER
5.0000 mL | Freq: Once | ORAL | Status: AC
  Administered 2014-11-02: 5 mL via ORAL
  Filled 2014-11-02: qty 5

## 2014-11-02 MED ORDER — POTASSIUM CHLORIDE CRYS ER 20 MEQ PO TBCR
60.0000 meq | EXTENDED_RELEASE_TABLET | Freq: Once | ORAL | Status: AC
  Administered 2014-11-02: 60 meq via ORAL
  Filled 2014-11-02: qty 3

## 2014-11-02 MED ORDER — AZITHROMYCIN 250 MG PO TABS: 250.0000 mg | ORAL_TABLET | Freq: Every day | ORAL | Status: DC

## 2014-11-02 MED ORDER — HYDROCOD POLST-CPM POLST ER 10-8 MG/5ML PO SUER: 5.0000 mL | Freq: Two times a day (BID) | ORAL | Status: DC | PRN

## 2014-11-02 NOTE — ED Notes (Signed)
PA Sanders at bedside  

## 2014-11-02 NOTE — ED Provider Notes (Signed)
CSN: [id number]     Arrival date & time 11/02/14  1509 History   First MD Initiated Contact with Patient 11/02/14 1607     Chief Complaint  Patient presents with  . Chest Pain    The patient says she "caught" a cold from someone last week.  She says she has a productive cough and she canot sleep at night from the pain in her chet and her back.    . Cough     (Consider location/radiation/quality/duration/timing/severity/associated sxs/prior Treatment) The history is provided by the patient and medical records.    This is a 49 y.o. F with PMH significant for mitral regurgitation, dyslipidemia, HTN, CHF, sleep apnea, presenting to the ED for cough and chest pain.  Patient states she thinks she caught a cold last week and since this time has been having productive cough with thick, white phlegm. She denies any fever or chills. She states cough is worse at night when trying to lie flat, therefore she has been sleeping upright in a chair. States she has pain in her chest and back which she thinks is due to repetitive coughing. She denies any frank shortness of breath. She has noticed some wheezing.  She denies hx of COPD.  She does smoke daily, approx 2 cigarettes per day.  She states she normally has albuterol inhaler at home, however this has since run out.  Patient is followed by cardiology, Dr. Caryl Comes.  She has been taking motrin PTA without relief.  Past Medical History  Diagnosis Date  . MITRAL REGURGITATION 10/15/2008  . 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 Alopecia   . Nonischemic cardiomyopathy     iniatially presumed 2/2 peripartum with improvement and then worsening - EF 25-30% by echo 07/2013.  Marland Kitchen Noncompliance   . Tobacco abuse   . Fibroids   . Abnormal uterine bleeding   . Menorrhagia   . Peripartum cardiomyopathy   . DYSLIPIDEMIA 06/08/2010  . Chronic combined systolic and diastolic CHF (congestive heart failure) 1994    a. 07/2013  EF of 25-30%, LV, mod LVH, diffuse hypokinesis  and mild MR  . Hypertension 1994  . Stroke 1994  . Sleep apnea 2015    CPAP 12/2013   Past Surgical History  Procedure Laterality Date  . Loop recorder implant  ~ 2000  . Tibial tuberclerplasty    . Cesarean section  1992  1994  . Tubal ligation  1994   Family History  Problem Relation Age of Onset  . Other Neg Hx   . Heart disease Neg Hx   . Cancer Maternal Grandmother     uterine  . Hypertension Sister    History  Substance Use Topics  . Smoking status: Former Smoker -- 0.12 packs/day for 30 years    Types: Cigarettes    Quit date: 05/24/2010  . Smokeless tobacco: Never Used  . Alcohol Use: No   OB History    Gravida Para Term Preterm AB TAB SAB Ectopic Multiple Living   2 2 2      1 3      Review of Systems  Respiratory: Positive for cough and wheezing.   Cardiovascular: Positive for chest pain.  All other systems reviewed and are negative.     Allergies  Ace inhibitors  Home Medications   Prior to Admission medications   Medication Sig Start Date End Date Taking? Authorizing Provider  Biotin 1 MG CAPS Take 1 mg by mouth daily. 01/29/14   Boykin Nearing, MD  furosemide (LASIX) 40 MG tablet Take 1 tablet (40 mg total) by mouth daily. 01/15/14   Deboraha Sprang, MD  losartan (COZAAR) 50 MG tablet Take 1 tablet (50 mg total) by mouth daily. 09/12/14   Josalyn Funches, MD  megestrol (MEGACE) 40 MG tablet Take 1 tablet (40 mg total) by mouth 2 (two) times daily. Patient taking differently: Take 40 mg by mouth daily.  05/30/14   Peggy Constant, MD  metoprolol succinate (TOPROL-XL) 25 MG 24 hr tablet Take 1 tablet (25 mg total) by mouth daily. 09/12/14   Josalyn Funches, MD  omeprazole (PRILOSEC) 20 MG capsule Take 20 mg by mouth daily.    Historical Provider, MD  Potassium Chloride ER 20 MEQ TBCR TAKE 1 TABLET BY MOUTH 2 TIMES DAILY. 06/06/14   Deboraha Sprang, MD  simvastatin (ZOCOR) 10 MG tablet Take 1 tablet (10 mg total) by mouth daily. 09/12/14   Josalyn Funches, MD   terbinafine (LAMISIL AT) 1 % cream Apply 1 application topically 2 (two) times daily. 09/12/14   Josalyn Funches, MD   BP 141/90 mmHg  Pulse 105  Temp(Src) 98.5 F (36.9 C) (Oral)  Resp 20  Ht 5\' 8"  (1.727 m)  Wt 196 lb (88.905 kg)  BMI 29.81 kg/m2  SpO2 100%   Physical Exam  Constitutional: She is oriented to person, place, and time. She appears well-developed and well-nourished.  HENT:  Head: Normocephalic and atraumatic.  Right Ear: Tympanic membrane and ear canal normal.  Left Ear: Tympanic membrane and ear canal normal.  Nose: Nose normal.  Mouth/Throat: Uvula is midline, oropharynx is clear and moist and mucous membranes are normal. No oropharyngeal exudate, posterior oropharyngeal edema, posterior oropharyngeal erythema or tonsillar abscesses.  Eyes: Conjunctivae and EOM are normal. Pupils are equal, round, and reactive to light.  Neck: Normal range of motion.  Cardiovascular: Normal rate, regular rhythm and normal heart sounds.   Pulmonary/Chest: Effort normal. She has wheezes. She exhibits tenderness.  Chest wall diffusely tender to palpation without noted deformities; expiratory wheezes throughout; no distress; speaking in full sentences without difficulty  Abdominal: Soft. Bowel sounds are normal.  Musculoskeletal: Normal range of motion. She exhibits no edema.  Neurological: She is alert and oriented to person, place, and time.  Skin: Skin is warm and dry.  Psychiatric: She has a normal mood and affect.  Nursing note and vitals reviewed.   ED Course  Procedures (including critical care time) Labs Review Labs Reviewed  CBC WITH DIFFERENTIAL/PLATELET - Abnormal; Notable for the following:    Lymphocytes Relative 48 (*)    All other components within normal limits  BASIC METABOLIC PANEL - Abnormal; Notable for the following:    Potassium 2.9 (*)    Glucose, Bld 147 (*)    Calcium 8.7 (*)    All other components within normal limits  Randolm Idol, ED     Imaging Review Dg Chest 2 View  11/02/2014   CLINICAL DATA:  Cough for 7 days.  EXAM: CHEST  2 VIEW  COMPARISON:  02/11/2014 and prior chest radiographs dating back to 11/10/2008  FINDINGS: The cardiomediastinal silhouette is unremarkable.  Mild peribronchial thickening is unchanged.  There is no evidence of focal airspace disease, pulmonary edema, suspicious pulmonary nodule/mass, pleural effusion, or pneumothorax. No acute bony abnormalities are identified.  IMPRESSION: No active cardiopulmonary disease.   Electronically Signed   By: Margarette Canada M.D.   On: 11/02/2014 17:24     EKG Interpretation None  MDM   Final diagnoses:  Chest pain, unspecified chest pain type  Cough   49 y.o. F here with cough and chest pain x 1 week.  Thinks she got a "cold".  Patient afebrile, non-toxic.  She does have wheezes on exam but is in NAD.  VSS on RA.  Chest pain seems MSK as it is reproducible on exam-- likely due to coughing.  She does not appear fluid overloaded. EKG reassuring, non-specific changes noted, largely unchanged from previous.  Lab work as above-- hypokalemia noted at 2.9, patient is on lasix which is the likely culprit.  Troponin negative.  CXR without evidence of pneumonia or fluid overload.  Patient was treated with albuterol neb and tussionex with improvement of her symptoms.  Her VS remain stable on RA.  Given negative work-up here, doubt ACS, PE, dissection, or other acute cardiac event at this time.  Suspect bronchitis.  Patient will be discharged home with azithromycin and tussionex, refill of albuterol inhaler given in ED.  FU with PCP encouraged.  Discussed plan with patient, he/she acknowledged understanding and agreed with plan of care.  Return precautions given for new or worsening symptoms.  Larene Pickett, PA-C 11/02/14 Randol Kern  Virgel Manifold, MD 11/02/14 [id number]

## 2014-11-02 NOTE — ED Notes (Signed)
The patient says she "caught" a cold from someone last week.  She says she has a productive cough and she canot sleep at night from the pain in her chet and her back.  The patient said she is here because it is not going away.

## 2014-11-02 NOTE — Discharge Instructions (Signed)
Take the prescribed medication as directed.  Use caution when taking cough medication, it can make you drowsy. Follow-up with your primary care physician. Return to the ED for new or worsening symptoms.

## 2014-11-08 ENCOUNTER — Ambulatory Visit: Payer: Medicaid Other | Attending: Family Medicine | Admitting: Family Medicine

## 2014-11-08 VITALS — BP 152/88 | HR 98 | Temp 98.4°F | Resp 20 | Ht 68.0 in | Wt 195.6 lb

## 2014-11-08 DIAGNOSIS — R05 Cough: Secondary | ICD-10-CM

## 2014-11-08 DIAGNOSIS — J069 Acute upper respiratory infection, unspecified: Secondary | ICD-10-CM

## 2014-11-08 DIAGNOSIS — R059 Cough, unspecified: Secondary | ICD-10-CM

## 2014-11-08 MED ORDER — GUAIFENESIN-CODEINE 100-10 MG/5ML PO SOLN: 5.0000 mL | Freq: Three times a day (TID) | ORAL | Status: DC | PRN

## 2014-11-08 NOTE — Progress Notes (Signed)
Subjective:     [name], female   DOB: 1965-08-06, 49 y.o.   MRN: [id number]  HPI Patient presents today for follow-up ED visit.for cold symptoms and cough. She was treated with zithromax and tussionex. Her insurance does not cover tussionex. She reports having continued cough and congestion. She reports this started about a week ago with nasal congestion, irritated eyes and then cough. She has also had some wheezing.She reports being unable to sleep due to the cough. She has an inhaler for the wheezing  Review of Systems  Denies fever, chills, ST, ear pain, chest pain except with cough, denies abd pain, nausea, vomiting      Objective:   Physical Exam   Alert, oriented, appropriate, in no distress. No coughing while in the room Skin is warm and dry Neck is supple FROM w/o adenopathy or tenderness Lungs are clear, without crackles or wheezes HS are regularw/o m,g,r      Assessment plan     1.  URI with cough.      - I have changed the prescription for cough to guainfenesin with codiene, one tsp q 8 hours prn cough.      - I have explained that she will probably have some symptoms for 7-10 more days      - She needs to make an appointment with her PCP to discuss some other ongoing issues.         Micheline Chapman, FNP-BC

## 2014-11-08 NOTE — Progress Notes (Signed)
Patient here for ED follow up for cold and cough symptoms.  Patient was prescribe tussionex but it is not covered by her insurance so she would like to see if something different can be prescribed. Patient is still having a bad cough and wheezing. Patient reports that she coughs more at night. Patient reports bump on her left big toes and fungus on both feet are not clearing up. Lamisil cream was prescribed but patient said this pharmacy does not have it here. Patient would like to talk to the Education officer, museum. Patient BP 152/88 and she has taken her BP medication today.

## 2014-11-08 NOTE — Progress Notes (Signed)
ASSESSMENT: Pt currently experiencing homelessness. Pt would benefit from additional community housing resources. Stage of Change: contemplative PLAN: 1. F/U with behavioral health consultant in as needed 2. Psychiatric Medications: none. 3. Behavioral recommendation(s):   -Consider going to [facility] daily until a bed opens up -Consider making an application appointment for [employer] housing SUBJECTIVE: Pt. referred by Dr. Adrian Blackwater for psychosocial problem:  Pt. here for follow up regarding psychosocial problem.  Pt. reports the following symptoms/concerns: Pt states that [employer] did not help her; she has given [facility] shelter her number to call when a bed opens (a month ago), but she has not received a call back. Pt states that she would stay at either the [facility] shelter or [facility]. Duration of problem: about 2 months Severity: moderate  OBJECTIVE: Orientation & Cognition: Oriented x3. Thought processes normal and appropriate to situation. Mood: appropriate. Affect: appropriate Appearance: appropriate Risk of harm to self or others: no risk of harm to self or others Substance use: none Psychiatric medication use: Unchanged from prior contact. Assessments administered: none  Diagnosis: Problem related to psychosocial circumstance CPT Code: Z65.9 -------------------------------------------- Other(s) present in the room: none  Time spent with patient in exam room: 16 minutes

## 2014-11-08 NOTE — Patient Instructions (Signed)
You may find it helpful to sleep with head slightly elevated. Expect to have some symptoms for another week to 10 days. Follow-up if continues after that [place] with codiene 1 tsp q 8 hours as needed for cough. Lots of liquids.

## 2014-11-19 ENCOUNTER — Other Ambulatory Visit: Payer: Self-pay | Admitting: Internal Medicine

## 2014-11-29 ENCOUNTER — Emergency Department (HOSPITAL_COMMUNITY)
Admission: EM | Admit: 2014-11-29 | Discharge: 2014-11-29 | Disposition: A | Discharge status: Home / Self Care | Payer: Medicaid Other | Attending: Emergency Medicine | Admitting: Emergency Medicine

## 2014-11-29 ENCOUNTER — Encounter (HOSPITAL_COMMUNITY): Payer: Self-pay

## 2014-11-29 DIAGNOSIS — Y998 Other external cause status: Secondary | ICD-10-CM | POA: Insufficient documentation

## 2014-11-29 DIAGNOSIS — Z9981 Dependence on supplemental oxygen: Secondary | ICD-10-CM | POA: Insufficient documentation

## 2014-11-29 DIAGNOSIS — Z793 Long term (current) use of hormonal contraceptives: Secondary | ICD-10-CM | POA: Diagnosis not present

## 2014-11-29 DIAGNOSIS — S80861A Insect bite (nonvenomous), right lower leg, initial encounter: Secondary | ICD-10-CM | POA: Insufficient documentation

## 2014-11-29 DIAGNOSIS — E785 Hyperlipidemia, unspecified: Secondary | ICD-10-CM | POA: Insufficient documentation

## 2014-11-29 DIAGNOSIS — Z8673 Personal history of transient ischemic attack (TIA), and cerebral infarction without residual deficits: Secondary | ICD-10-CM | POA: Diagnosis not present

## 2014-11-29 DIAGNOSIS — Y9389 Activity, other specified: Secondary | ICD-10-CM | POA: Insufficient documentation

## 2014-11-29 DIAGNOSIS — I10 Essential (primary) hypertension: Secondary | ICD-10-CM | POA: Insufficient documentation

## 2014-11-29 DIAGNOSIS — Z872 Personal history of diseases of the skin and subcutaneous tissue: Secondary | ICD-10-CM | POA: Insufficient documentation

## 2014-11-29 DIAGNOSIS — Q054 Unspecified spina bifida with hydrocephalus: Secondary | ICD-10-CM | POA: Insufficient documentation

## 2014-11-29 DIAGNOSIS — G4730 Sleep apnea, unspecified: Secondary | ICD-10-CM | POA: Insufficient documentation

## 2014-11-29 DIAGNOSIS — Z87891 Personal history of nicotine dependence: Secondary | ICD-10-CM | POA: Diagnosis not present

## 2014-11-29 DIAGNOSIS — S80862A Insect bite (nonvenomous), left lower leg, initial encounter: Secondary | ICD-10-CM | POA: Diagnosis not present

## 2014-11-29 DIAGNOSIS — S60562A Insect bite (nonvenomous) of left hand, initial encounter: Secondary | ICD-10-CM | POA: Diagnosis not present

## 2014-11-29 DIAGNOSIS — I5042 Chronic combined systolic (congestive) and diastolic (congestive) heart failure: Secondary | ICD-10-CM | POA: Diagnosis not present

## 2014-11-29 DIAGNOSIS — Z79899 Other long term (current) drug therapy: Secondary | ICD-10-CM | POA: Diagnosis not present

## 2014-11-29 DIAGNOSIS — Z86018 Personal history of other benign neoplasm: Secondary | ICD-10-CM | POA: Insufficient documentation

## 2014-11-29 DIAGNOSIS — W57XXXA Bitten or stung by nonvenomous insect and other nonvenomous arthropods, initial encounter: Secondary | ICD-10-CM | POA: Insufficient documentation

## 2014-11-29 DIAGNOSIS — Y9259 Other trade areas as the place of occurrence of the external cause: Secondary | ICD-10-CM | POA: Insufficient documentation

## 2014-11-29 DIAGNOSIS — N939 Abnormal uterine and vaginal bleeding, unspecified: Secondary | ICD-10-CM | POA: Insufficient documentation

## 2014-11-29 DIAGNOSIS — S60561A Insect bite (nonvenomous) of right hand, initial encounter: Secondary | ICD-10-CM | POA: Insufficient documentation

## 2014-11-29 MED ORDER — HYDROCORTISONE 1 % EX CREA: TOPICAL_CREAM | CUTANEOUS | Status: DC

## 2014-11-29 MED ORDER — HYDROXYZINE HCL 25 MG PO TABS
25.0000 mg | ORAL_TABLET | Freq: Once | ORAL | Status: AC
  Administered 2014-11-29: 25 mg via ORAL
  Filled 2014-11-29: qty 1

## 2014-11-29 MED ORDER — HYDROXYZINE HCL 25 MG PO TABS: 25.0000 mg | ORAL_TABLET | Freq: Four times a day (QID) | ORAL | Status: DC

## 2014-11-29 NOTE — Discharge Instructions (Signed)
Bedbugs °Bedbugs are tiny bugs that live in and around beds. During the day, they hide in mattresses and other places near beds. They come out at night and bite people lying in bed. They need blood to live and grow. Bedbugs can be found in beds anywhere. Usually, they are found in places where many people come and go (hotels, shelters, hospitals). It does not matter whether the place is dirty or clean. °Getting bitten by bedbugs rarely causes a medical problem. The biggest problem can be getting rid of them.  This often takes the work of a pest control expert. °CAUSES °· Less use of pesticides. Bedbugs were common before the 1950s. Then, strong pesticides such as DDT nearly wiped them out. Today, these pesticides are not used because they harm the environment and can cause health problems. °· More travel. Besides mattresses, bedbugs can also live in clothing and luggage. They can come along as people travel from place to place. Bedbugs are more common in certain parts of the world. When people travel to those areas, the bugs can come home with them. °· Presence of birds and bats. Bedbugs often infest birds and bats. If you have these animals in or near your home, bedbugs may infest your house, too. °SYMPTOMS °It does not hurt to be bitten by a bedbug. You will probably not wake up when you are bitten. Bedbugs usually bite areas of the skin that are not covered. Symptoms may show when you wake up, or they may take a day or more to show up. Symptoms may include: °· Small red bumps on the skin. These might be lined up in a row or clustered in a group. °· A darker red dot in the middle of red bumps. °· Blisters on the skin. There may be swelling and very bad itching. These may be signs of an allergic reaction. This does not happen often. °DIAGNOSIS °Bedbug bites might look and feel like other types of insect bites. The bugs do not stay on the body like ticks or lice. They bite, drop off, and crawl away to hide. Your  caregiver will probably: °· Ask about your symptoms. °· Ask about your recent activities and travel. °· Check your skin for bedbug bites. °· Ask you to check at home for signs of bedbugs. You should look for: °¨ Spots or stains on the bed or nearby. This could be from bedbugs that were crushed or from their eggs or waste. °¨ Bedbugs themselves. They are reddish-brown, oval, and flat. They do not fly. They are about the size of an apple seed. °· Places to look for bedbugs include: °¨ Beds. Check mattresses, headboards, box springs, and bed frames. °¨ On drapes and curtains near the bed. °¨ Under carpeting in the bedroom. °¨ Behind electrical outlets. °¨ Behind any wallpaper that is peeling. °¨ Inside luggage. °TREATMENT °Most bedbug bites do not need treatment. They usually go away on their own in a few days. The bites are not dangerous. However, treatment may be needed if you have scratched so much that your skin has become infected. You may also need treatment if you are allergic to bedbug bites. Treatment options include: °· A drug that stops swelling and itching (corticosteroid). Usually, a cream is rubbed on the skin. If you have a bad rash, you may be given a corticosteroid pill. °· Oral antihistamines. These are pills to help control itching. °· Antibiotic medicines. An antibiotic may be prescribed for infected skin. °HOME CARE INSTRUCTIONS  °·   given a corticosteroid pill.  · Oral antihistamines. These are pills to help control itching.  · Antibiotic medicines. An antibiotic may be prescribed for infected skin.  HOME CARE INSTRUCTIONS   · Take any medicine prescribed by your caregiver for your bites. Follow the directions carefully.  · Consider wearing pajamas with long sleeves and pant legs.  · Your bedroom may need to be treated. A pest control expert should make sure the bedbugs are gone. You may need to throw away mattresses or luggage. Ask the pest control expert what you can do to keep the bedbugs from coming back. Common suggestions include:  ¨ Putting a plastic cover over your mattress.  ¨ Washing and drying your clothes and bedding in hot water and a hot dryer. The temperature should be hotter  than 120° F (48.9° C). Bedbugs are killed by high temperatures.  ¨ Vacuuming carefully all around your bed. Vacuum in all cracks and crevices where the bugs might hide. Do this often.  ¨ Carefully checking all used furniture, bedding, or clothes that you bring into your house.  ¨ Eliminating bird nests and bat roosts.  · If you get bedbug bites when traveling, check all your possessions carefully before bringing them into your house. If you find any bugs on clothes or in your luggage, consider throwing those items away.  SEEK MEDICAL CARE IF:  · You have red bug bites that keep coming back.  · You have red bug bites that itch badly.  · You have bug bites that cause a skin rash.  · You have scratch marks that are red and sore.  SEEK IMMEDIATE MEDICAL CARE IF:  You have a fever.  Document Released: 06/12/2010 Document Revised: 08/02/2011 Document Reviewed: 06/12/2010  [employer]® Patient Information ©2015 [employer]. This information is not intended to replace advice given to you by your health care provider. Make sure you discuss any questions you have with your health care provider.

## 2014-11-29 NOTE — ED Notes (Signed)
Pt presents with 3-4 day h/o insect bites.  Pt reports she and her boyfriend are staying at motel, reports seeing red bugs on the bed.  Pt reports bites to legs initially with generalized itching now.  Pt's boyfriend does not have same.  Pt denies any new topicals, soap, detergent or fabric softeners, no new foods or medication.

## 2014-11-29 NOTE — ED Provider Notes (Signed)
CSN: [id number]     Arrival date & time 11/29/14  1407 History  This chart was scribed for Lenn Sink, PA-C, working with Milton Ferguson, MD by Starleen Arms, ED Scribe. This patient was seen in room TR05C/TR05C and the patient's care was started at 2:29 PM.   No chief complaint on file.  The history is provided by the patient. No language interpreter was used.   HPI Comments: Chelsea Branch is a 49 y.o. female who presents to the Emergency Department complaining of insect bites on her upper and lower extremities with associated itching aggravated by heat.  The patient reports she is living in a motel currently and visualized several red bugs in the bed just after onset.  She has used rubbing alcohol without relief.   She lives with her boyfriend who has not developed a similar complaint.  Patient is made hotel staff aware, reports that she will not be going back to the same situation. She denies fever, chills, nausea, vomiting, or any other systemic symptoms. No signs of infection.  Past Medical History  Diagnosis Date  . MITRAL REGURGITATION 10/15/2008  . 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 Alopecia   . Nonischemic cardiomyopathy     iniatially presumed 2/2 peripartum with improvement and then worsening - EF 25-30% by echo 07/2013.  Marland Kitchen Noncompliance   . Tobacco abuse   . Fibroids   . Abnormal uterine bleeding   . Menorrhagia   . Peripartum cardiomyopathy   . DYSLIPIDEMIA 06/08/2010  . Chronic combined systolic and diastolic CHF (congestive heart failure) 1994    a. 07/2013  EF of 25-30%, LV, mod LVH, diffuse hypokinesis and mild MR  . Hypertension 1994  . Stroke 1994  . Sleep apnea 2015    CPAP 12/2013   Past Surgical History  Procedure Laterality Date  . Loop recorder implant  ~ 2000  . Tibial tuberclerplasty    . Cesarean section  1992  1994  . Tubal ligation  1994   Family History  Problem Relation Age of Onset  . Other Neg Hx   . Heart disease Neg Hx   . Cancer Maternal  Grandmother     uterine  . Hypertension Sister    History  Substance Use Topics  . Smoking status: Former Smoker -- 0.12 packs/day for 30 years    Types: Cigarettes    Quit date: 05/24/2010  . Smokeless tobacco: Never Used  . Alcohol Use: No   OB History    Gravida Para Term Preterm AB TAB SAB Ectopic Multiple Living   2 2 2      1 3      Review of Systems  Skin: Positive for rash.  All other systems reviewed and are negative.   Allergies  Ace inhibitors  Home Medications   Prior to Admission medications   Medication Sig Start Date End Date Taking? Authorizing Provider  azithromycin (ZITHROMAX) 250 MG tablet Take 1 tablet (250 mg total) by mouth daily. Take first 2 tablets together, then 1 every day until finished. Patient not taking: Reported on 11/08/2014 11/02/14   Larene Pickett, PA-C  Biotin 1 MG CAPS Take 1 mg by mouth daily. 01/29/14   Boykin Nearing, MD  chlorpheniramine-HYDROcodone (TUSSIONEX PENNKINETIC ER) 10-8 MG/5ML SUER Take 5 mLs by mouth every 12 (twelve) hours as needed for cough. Patient not taking: Reported on 11/08/2014 11/02/14   Larene Pickett, PA-C  furosemide (LASIX) 40 MG tablet Take 1 tablet (40 mg total) by mouth daily.  01/15/14   Deboraha Sprang, MD  guaiFENesin-codeine 100-10 MG/5ML syrup Take 5 mLs by mouth 3 (three) times daily as needed for cough. 11/08/14   Micheline Chapman, NP  hydrALAZINE (APRESOLINE) 25 MG tablet TAKE 1 TABLET BY MOUTH 2 TIMES DAILY. 11/20/14   Deboraha Sprang, MD  hydrocortisone cream 1 % Apply to affected area 2 times daily 11/29/14   Okey Regal, PA-C  hydrOXYzine (ATARAX/VISTARIL) 25 MG tablet Take 1 tablet (25 mg total) by mouth every 6 (six) hours. 11/29/14   Okey Regal, PA-C  losartan (COZAAR) 50 MG tablet Take 1 tablet (50 mg total) by mouth daily. 09/12/14   Josalyn Funches, MD  megestrol (MEGACE) 40 MG tablet Take 1 tablet (40 mg total) by mouth 2 (two) times daily. Patient taking differently: Take 40 mg by mouth daily.   05/30/14   Peggy Constant, MD  metoprolol succinate (TOPROL-XL) 25 MG 24 hr tablet Take 1 tablet (25 mg total) by mouth daily. 09/12/14   Josalyn Funches, MD  omeprazole (PRILOSEC) 20 MG capsule Take 20 mg by mouth daily.    Historical Provider, MD  Potassium Chloride ER 20 MEQ TBCR TAKE 1 TABLET BY MOUTH 2 TIMES DAILY. 06/06/14   Deboraha Sprang, MD  simvastatin (ZOCOR) 10 MG tablet Take 1 tablet (10 mg total) by mouth daily. 09/12/14   Josalyn Funches, MD  terbinafine (LAMISIL AT) 1 % cream Apply 1 application topically 2 (two) times daily. Patient not taking: Reported on 11/08/2014 09/12/14   Josalyn Funches, MD   BP 128/82 mmHg  Pulse 107  Temp(Src) 97.6 F (36.4 C) (Oral)  Resp 16  SpO2 98%   Physical Exam  Constitutional: She is oriented to person, place, and time. She appears well-developed and well-nourished. No distress.  HENT:  Head: Normocephalic and atraumatic.  Eyes: Conjunctivae and EOM are normal.  Neck: Neck supple. No tracheal deviation present.  Cardiovascular: Normal rate.   Pulmonary/Chest: Effort normal. No respiratory distress.  Musculoskeletal: Normal range of motion.  Neurological: She is alert and oriented to person, place, and time.  Skin: Skin is warm and dry.  Multiple bug bites on upper and lowe extremities.  No signs of infection  Psychiatric: She has a normal mood and affect. Her behavior is normal.  Nursing note and vitals reviewed.   ED Course  Procedures (including critical care time)  DIAGNOSTIC STUDIES: Oxygen Saturation is 98% on RA, normal by my interpretation.    COORDINATION OF CARE:  2:34 PM Discussed treatment plan with patient at bedside.  Patient acknowledges and agrees with plan.    Labs Review Labs Reviewed - No data to display  Imaging Review No results found.   EKG Interpretation None      MDM   Final diagnoses:  Insect bite    Labs:  Imaging:   Therapeutics:   Assessment/Plan: Patient presents with insect bite,  likely represents bedbugs. Patient will be treated with hydroxyzine, hydrocortisone cream. Patient reports that she will no longer be staying at the place where she is been bitten. 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 Patient verbalizes understanding and agreement for today's plan and had no further questions or concerns at time of discharge  Discharge Meds: Hydroxyzine, Hydrocortisone Cream 1%  I personally performed the services described in this documentation, which was scribed in my presence. The recorded information has  been reviewed and is accurate.   Okey Regal, PA-C 11/29/14 1715  Milton Ferguson, MD 11/30/14 [phone]

## 2014-12-07 ENCOUNTER — Emergency Department (HOSPITAL_COMMUNITY)
Admission: EM | Admit: 2014-12-07 | Discharge: 2014-12-07 | Disposition: A | Discharge status: Home / Self Care | Payer: Medicaid Other | Attending: Emergency Medicine | Admitting: Emergency Medicine

## 2014-12-07 ENCOUNTER — Encounter (HOSPITAL_COMMUNITY): Payer: Self-pay | Admitting: Nurse Practitioner

## 2014-12-07 DIAGNOSIS — E785 Hyperlipidemia, unspecified: Secondary | ICD-10-CM | POA: Insufficient documentation

## 2014-12-07 DIAGNOSIS — Z8673 Personal history of transient ischemic attack (TIA), and cerebral infarction without residual deficits: Secondary | ICD-10-CM | POA: Insufficient documentation

## 2014-12-07 DIAGNOSIS — M545 Low back pain: Secondary | ICD-10-CM | POA: Diagnosis present

## 2014-12-07 DIAGNOSIS — G4733 Obstructive sleep apnea (adult) (pediatric): Secondary | ICD-10-CM | POA: Insufficient documentation

## 2014-12-07 DIAGNOSIS — Z86018 Personal history of other benign neoplasm: Secondary | ICD-10-CM | POA: Diagnosis not present

## 2014-12-07 DIAGNOSIS — Z79899 Other long term (current) drug therapy: Secondary | ICD-10-CM | POA: Insufficient documentation

## 2014-12-07 DIAGNOSIS — M5442 Lumbago with sciatica, left side: Secondary | ICD-10-CM | POA: Diagnosis not present

## 2014-12-07 DIAGNOSIS — I10 Essential (primary) hypertension: Secondary | ICD-10-CM | POA: Insufficient documentation

## 2014-12-07 DIAGNOSIS — I5042 Chronic combined systolic (congestive) and diastolic (congestive) heart failure: Secondary | ICD-10-CM | POA: Insufficient documentation

## 2014-12-07 DIAGNOSIS — Z87891 Personal history of nicotine dependence: Secondary | ICD-10-CM | POA: Insufficient documentation

## 2014-12-07 DIAGNOSIS — Z9981 Dependence on supplemental oxygen: Secondary | ICD-10-CM | POA: Diagnosis not present

## 2014-12-07 MED ORDER — TRAMADOL HCL 50 MG PO TABS
50.0000 mg | ORAL_TABLET | Freq: Four times a day (QID) | ORAL | Status: DC | PRN
Start: 2014-12-07 — End: 2015-03-19

## 2014-12-07 MED ORDER — CYCLOBENZAPRINE HCL 10 MG PO TABS: 10.0000 mg | ORAL_TABLET | Freq: Two times a day (BID) | ORAL | Status: DC | PRN

## 2014-12-07 MED ORDER — TRAMADOL HCL 50 MG PO TABS
50.0000 mg | ORAL_TABLET | Freq: Once | ORAL | Status: AC
  Administered 2014-12-07: 50 mg via ORAL
  Filled 2014-12-07: qty 1

## 2014-12-07 MED ORDER — CYCLOBENZAPRINE HCL 10 MG PO TABS
5.0000 mg | ORAL_TABLET | Freq: Once | ORAL | Status: AC
  Administered 2014-12-07: 5 mg via ORAL
  Filled 2014-12-07: qty 1

## 2014-12-07 NOTE — Discharge Instructions (Signed)
Back Pain, Adult Low back pain is very common. About 1 in 5 people have back pain.The cause of low back pain is rarely dangerous. The pain often gets better over time.About half of people with a sudden onset of back pain feel better in just 2 weeks. About 8 in 10 people feel better by 6 weeks.  CAUSES Some common causes of back pain include:  Strain of the muscles or ligaments supporting the spine.  Wear and tear (degeneration) of the spinal discs.  Arthritis.  Direct injury to the back. DIAGNOSIS Most of the time, the direct cause of low back pain is not known.However, back pain can be treated effectively even when the exact cause of the pain is unknown.Answering your caregiver's questions about your overall health and symptoms is one of the most accurate ways to make sure the cause of your pain is not dangerous. If your caregiver needs more information, he or she may order lab work or imaging tests (X-rays or MRIs).However, even if imaging tests show changes in your back, this usually does not require surgery. HOME CARE INSTRUCTIONS For many people, back pain returns.Since low back pain is rarely dangerous, it is often a condition that people can learn to manageon their own.   Remain active. It is stressful on the back to sit or stand in one place. Do not sit, drive, or stand in one place for more than 30 minutes at a time. Take short walks on level surfaces as soon as pain allows.Try to increase the length of time you walk each day.  Do not stay in bed.Resting more than 1 or 2 days can delay your recovery.  Do not avoid exercise or work.Your body is made to move.It is not dangerous to be active, even though your back may hurt.Your back will likely heal faster if you return to being active before your pain is gone.  Pay attention to your body when you bend and lift. Many people have less discomfortwhen lifting if they bend their knees, keep the load close to their bodies,and  avoid twisting. Often, the most comfortable positions are those that put less stress on your recovering back.  Find a comfortable position to sleep. Use a firm mattress and lie on your side with your knees slightly bent. If you lie on your back, put a pillow under your knees.  Only take over-the-counter or prescription medicines as directed by your caregiver. Over-the-counter medicines to reduce pain and inflammation are often the most helpful.Your caregiver may prescribe muscle relaxant drugs.These medicines help dull your pain so you can more quickly return to your normal activities and healthy exercise.  Put ice on the injured area.  Put ice in a plastic bag.  Place a towel between your skin and the bag.  Leave the ice on for 15-20 minutes, 03-04 times a day for the first 2 to 3 days. After that, ice and heat may be alternated to reduce pain and spasms.  Ask your caregiver about trying back exercises and gentle massage. This may be of some benefit.  Avoid feeling anxious or stressed.Stress increases muscle tension and can worsen back pain.It is important to recognize when you are anxious or stressed and learn ways to manage it.Exercise is a great option. SEEK MEDICAL CARE IF:  You have pain that is not relieved with rest or medicine.  You have pain that does not improve in 1 week.  You have new symptoms.  You are generally not feeling well. SEEK   IMMEDIATE MEDICAL CARE IF:   You have pain that radiates from your back into your legs.  You develop new bowel or bladder control problems.  You have unusual weakness or numbness in your arms or legs.  You develop nausea or vomiting.  You develop abdominal pain.  You feel faint. Document Released: 05/10/2005 Document Revised: 11/09/2011 Document Reviewed: 09/11/2013 Exit[employer] Patient Information 2015 [employer]. This information is not intended to replace advice given to you by your health care provider. Make sure you  discuss any questions you have with your health care provider.  

## 2014-12-07 NOTE — ED Notes (Signed)
Declined W/C at D/C and was escorted to lobby by RN. 

## 2014-12-07 NOTE — ED Notes (Signed)
She reports a back injury after falling down stairs 2 months, she was seen here for the injury. She reports the pain has persisted over past 2 months and is not getting any better. Denies bowel/bladder changes. Ambulatory, mae.

## 2014-12-07 NOTE — ED Provider Notes (Signed)
CSN: [id number]     Arrival date & time 12/07/14  1221 History  This chart was scribed for Brent General, PA-C, working with Ripley Fraise, MD by Steva Colder, ED Scribe. The patient was seen in room TR05C/TR05C at 1:13 PM.     Chief Complaint  Patient presents with  . Back Pain      The history is provided by the patient. No language interpreter was used.    HPI Comments: Chelsea Branch is a 49 y.o. female who presents to the Emergency Department complaining of worsening back pain onset 2 months. Pt reports that her back pain is from a back injury that she suffered after falling down stairs. Pt reports that she was seen for her back injury at [facility] 1 month ago. She reports that the back pain does radiate to her legs. She states that she is having associated symptoms of weakness. Pt has not tried muscle relaxer but she has tried Rx 80 mg ibuprofen for the relief of her symptoms. Pt denies bowel/bladder incontinence, gait problem, fever, n/v, hematuria, numbness, tingling, saddle paresthesia, and any other symptoms.    Past Medical History  Diagnosis Date  . MITRAL REGURGITATION 10/15/2008  . 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 Alopecia   . Nonischemic cardiomyopathy     iniatially presumed 2/2 peripartum with improvement and then worsening - EF 25-30% by echo 07/2013.  Marland Kitchen Noncompliance   . Tobacco abuse   . Fibroids   . Abnormal uterine bleeding   . Menorrhagia   . Peripartum cardiomyopathy   . DYSLIPIDEMIA 06/08/2010  . Chronic combined systolic and diastolic CHF (congestive heart failure) 1994    a. 07/2013  EF of 25-30%, LV, mod LVH, diffuse hypokinesis and mild MR  . Hypertension 1994  . Stroke 1994  . Sleep apnea 2015    CPAP 12/2013   Past Surgical History  Procedure Laterality Date  . Loop recorder implant  ~ 2000  . Tibial tuberclerplasty    . Cesarean section  1992  1994  . Tubal ligation  1994   Family History  Problem Relation Age of Onset  .  Other Neg Hx   . Heart disease Neg Hx   . Cancer Maternal Grandmother     uterine  . Hypertension Sister    History  Substance Use Topics  . Smoking status: Former Smoker -- 0.12 packs/day for 30 years    Types: Cigarettes    Quit date: 05/24/2010  . Smokeless tobacco: Never Used  . Alcohol Use: No   OB History    Gravida Para Term Preterm AB TAB SAB Ectopic Multiple Living   2 2 2      1 3      Review of Systems  Constitutional: Negative for fever.  Gastrointestinal: Negative for nausea and vomiting.       No bowel incontinence  Genitourinary:       No bladder incontinence  Musculoskeletal: Positive for back pain. Negative for gait problem.  Skin: Negative for color change, rash and wound.  Neurological: Positive for weakness. Negative for numbness.       No tingling      Allergies  Ace inhibitors  Home Medications   Prior to Admission medications   Medication Sig Start Date End Date Taking? Authorizing Provider  azithromycin (ZITHROMAX) 250 MG tablet Take 1 tablet (250 mg total) by mouth daily. Take first 2 tablets together, then 1 every day until finished. Patient not taking: Reported on 11/08/2014  11/02/14   Larene Pickett, PA-C  Biotin 1 MG CAPS Take 1 mg by mouth daily. 01/29/14   Boykin Nearing, MD  chlorpheniramine-HYDROcodone (TUSSIONEX PENNKINETIC ER) 10-8 MG/5ML SUER Take 5 mLs by mouth every 12 (twelve) hours as needed for cough. Patient not taking: Reported on 11/08/2014 11/02/14   Larene Pickett, PA-C  cyclobenzaprine (FLEXERIL) 10 MG tablet Take 1 tablet (10 mg total) by mouth 2 (two) times daily as needed for muscle spasms. 12/07/14   Dahlia Bailiff, PA-C  furosemide (LASIX) 40 MG tablet Take 1 tablet (40 mg total) by mouth daily. 01/15/14   Deboraha Sprang, MD  guaiFENesin-codeine 100-10 MG/5ML syrup Take 5 mLs by mouth 3 (three) times daily as needed for cough. 11/08/14   Micheline Chapman, NP  hydrALAZINE (APRESOLINE) 25 MG tablet TAKE 1 TABLET BY MOUTH 2 TIMES  DAILY. 11/20/14   Deboraha Sprang, MD  hydrocortisone cream 1 % Apply to affected area 2 times daily 11/29/14   Okey Regal, PA-C  hydrOXYzine (ATARAX/VISTARIL) 25 MG tablet Take 1 tablet (25 mg total) by mouth every 6 (six) hours. 11/29/14   Okey Regal, PA-C  losartan (COZAAR) 50 MG tablet Take 1 tablet (50 mg total) by mouth daily. 09/12/14   Josalyn Funches, MD  megestrol (MEGACE) 40 MG tablet Take 1 tablet (40 mg total) by mouth 2 (two) times daily. Patient taking differently: Take 40 mg by mouth daily.  05/30/14   Peggy Constant, MD  metoprolol succinate (TOPROL-XL) 25 MG 24 hr tablet Take 1 tablet (25 mg total) by mouth daily. 09/12/14   Josalyn Funches, MD  omeprazole (PRILOSEC) 20 MG capsule Take 20 mg by mouth daily.    Historical Provider, MD  Potassium Chloride ER 20 MEQ TBCR TAKE 1 TABLET BY MOUTH 2 TIMES DAILY. 06/06/14   Deboraha Sprang, MD  simvastatin (ZOCOR) 10 MG tablet Take 1 tablet (10 mg total) by mouth daily. 09/12/14   Josalyn Funches, MD  terbinafine (LAMISIL AT) 1 % cream Apply 1 application topically 2 (two) times daily. Patient not taking: Reported on 11/08/2014 09/12/14   Boykin Nearing, MD  traMADol (ULTRAM) 50 MG tablet Take 1 tablet (50 mg total) by mouth every 6 (six) hours as needed. 12/07/14   Dahlia Bailiff, PA-C   BP 149/80 mmHg  Pulse 88  Temp(Src) 97.9 F (36.6 C) (Oral)  Resp 16  Ht 5\' 8"  (1.727 m)  Wt 193 lb 4.8 oz (87.68 kg)  BMI 29.40 kg/m2  SpO2 100% Physical Exam  Constitutional: She is oriented to person, place, and time. She appears well-developed and well-nourished. No distress.  HENT:  Head: Normocephalic and atraumatic.  Eyes: EOM are normal.  Neck: Neck supple. No tracheal deviation present.  Cardiovascular: Normal rate.   Pulmonary/Chest: Effort normal. No respiratory distress.  Musculoskeletal: Normal range of motion.  paraspinous and lateral L2-L4 tenderness.   Neurological: She is alert and oriented to person, place, and time. She has normal  strength. No cranial nerve deficit or sensory deficit. She displays a negative Romberg sign. Coordination and gait normal. GCS eye subscore is 4. GCS verbal subscore is 5. GCS motor subscore is 6.  Patient fully alert, answering questions appropriately in full, clear sentences. Cranial nerves II through XII grossly intact. Motor strength 5 out of 5 in all major muscle groups of upper and lower extremities. Distal sensation intact.   Skin: Skin is warm and dry.  Psychiatric: She has a normal mood and affect. Her behavior is  normal.  Nursing note and vitals reviewed.   ED Course  Procedures (including critical care time) DIAGNOSTIC STUDIES: Oxygen Saturation is 97% on RA, nl by my interpretation.    COORDINATION OF CARE: 1:19 PM-Discussed treatment plan which includes f/u with [facility], referral to orthopedist with pt at bedside and pt agreed to plan.   Labs Review Labs Reviewed - No data to display  Imaging Review No results found.   EKG Interpretation None      MDM   Final diagnoses:  Left-sided low back pain with left-sided sciatica    Patient with back pain consistent with flareup of chronic back pain.  No neurological deficits and normal neuro exam.  Patient can walk but states is painful.  No loss of bowel or bladder control.  No concern for cauda equina.  No fever, night sweats, weight loss, h/o cancer, IVDU.  RICE protocol and pain medicine indicated and discussed with patient. Patient hemodynamically stable and in no acute distress. Patient stable for discharge. Return precautions discussed, patient strongly encouraged to follow up with PCP. patient verbalizes understanding and agreement of this plan.  I personally performed the services described in this documentation, which was scribed in my presence. The recorded information has been reviewed and is accurate.  BP 149/80 mmHg  Pulse 88  Temp(Src) 97.9 F (36.6 C) (Oral)  Resp 16  Ht 5\' 8"   (1.727 m)  Wt 193 lb 4.8 oz (87.68 kg)  BMI 29.40 kg/m2  SpO2 100%  Signed,  Dahlia Bailiff, PA-C 5:55 PM    Dahlia Bailiff, PA-C 12/07/14 1755  Ripley Fraise, MD 12/08/14 1008

## 2015-01-16 ENCOUNTER — Other Ambulatory Visit: Payer: Self-pay | Admitting: *Deleted

## 2015-01-16 MED ORDER — HYDRALAZINE HCL 25 MG PO TABS: ORAL_TABLET | ORAL | Status: DC

## 2015-01-16 MED ORDER — POTASSIUM CHLORIDE ER 20 MEQ PO TBCR: EXTENDED_RELEASE_TABLET | ORAL | Status: DC

## 2015-01-16 MED ORDER — FUROSEMIDE 40 MG PO TABS: 40.0000 mg | ORAL_TABLET | Freq: Every day | ORAL | Status: DC

## 2015-03-19 ENCOUNTER — Encounter: Payer: Self-pay | Admitting: Internal Medicine

## 2015-03-19 ENCOUNTER — Ambulatory Visit (INDEPENDENT_AMBULATORY_CARE_PROVIDER_SITE_OTHER): Payer: Medicaid Other | Admitting: Internal Medicine

## 2015-03-19 VITALS — BP 158/110 | HR 72 | Ht 68.0 in | Wt 199.0 lb

## 2015-03-19 DIAGNOSIS — I429 Cardiomyopathy, unspecified: Secondary | ICD-10-CM | POA: Diagnosis not present

## 2015-03-19 DIAGNOSIS — I428 Other cardiomyopathies: Secondary | ICD-10-CM

## 2015-03-19 DIAGNOSIS — I10 Essential (primary) hypertension: Secondary | ICD-10-CM | POA: Diagnosis not present

## 2015-03-19 MED ORDER — POTASSIUM CHLORIDE ER 20 MEQ PO TBCR
EXTENDED_RELEASE_TABLET | ORAL | Status: DC
Start: 2015-03-19 — End: 2015-06-17

## 2015-03-19 MED ORDER — FUROSEMIDE 40 MG PO TABS: 40.0000 mg | ORAL_TABLET | Freq: Every day | ORAL | Status: DC

## 2015-03-19 NOTE — Addendum Note (Signed)
Addended by: Alvis Lemmings C on: 03/19/2015 04:54 PM   Modules accepted: Orders

## 2015-03-19 NOTE — Patient Instructions (Signed)
Medication Instructions: - no changes  Labwork: - none  Procedures/Testing: - none  Follow-Up: - Your physician wants you to follow-up in: 1 year with Dr. Klein You will receive a reminder letter in the mail two months in advance. If you don't receive a letter, please call our office to schedule the follow-up appointment.  Any Additional Special Instructions Will Be Listed Below (If Applicable).   

## 2015-03-19 NOTE — Progress Notes (Signed)
Patient Care Team: Boykin Nearing, MD as PCP - General (Family Medicine) Deboraha Sprang, MD as PCP - Cardiology (Cardiology)   HPI  Chelsea Branch is a 49 y.o. 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 She had recovery of LV function most recently in 2008. 2011 left ventricular function had decreased to 25%.  She has significant hypertension.   She was rehospitalized 3/15 for acute on chronic congestive heart failure. She was treated with IV diuresis. Echocardiogram demonstrated EF 25-30%.   . Echocardiogram 8/15 demonstrated normalization of LV systolic function  She has applied for disability and Medicaid.  Her identical twin sons are now 56 still at home and are still driving her nuts  Past Medical History  Diagnosis Date  . MITRAL REGURGITATION 10/15/2008  . 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 Alopecia   . Nonischemic cardiomyopathy ([place])     iniatially presumed 2/2 peripartum with improvement and then worsening - EF 25-30% by echo 07/2013.  Marland Kitchen Noncompliance   . Tobacco abuse   . Fibroids   . Abnormal uterine bleeding   . Menorrhagia   . Peripartum cardiomyopathy   . DYSLIPIDEMIA 06/08/2010  . Chronic combined systolic and diastolic CHF (congestive heart failure) ([place]) 1994    a. 07/2013  EF of 25-30%, LV, mod LVH, diffuse hypokinesis and mild MR  . Hypertension 1994  . Stroke ([place]) 1994  . Sleep apnea 2015    CPAP 12/2013    Past Surgical History  Procedure Laterality Date  . Loop recorder implant  ~ 2000  . Tibial tuberclerplasty    . Cesarean section  1992  1994  . Tubal ligation  1994    Current Outpatient Prescriptions  Medication Sig Dispense Refill  . Biotin 1 MG CAPS Take 1 mg by mouth daily.    . chlorpheniramine-HYDROcodone (TUSSIONEX PENNKINETIC ER) 10-8 MG/5ML SUER Take 5 mLs by mouth every 12 (twelve) hours as needed for cough. 100 mL 0  . furosemide (LASIX) 40 MG tablet Take 1 tablet (40 mg total)  by mouth daily. 30 tablet 0  . hydrALAZINE (APRESOLINE) 25 MG tablet TAKE 1 TABLET BY MOUTH 2 TIMES DAILY. 60 tablet 0  . hydrocortisone cream 1 % Apply to affected area 2 times daily 15 g 0  . hydrOXYzine (ATARAX/VISTARIL) 25 MG tablet Take 1 tablet (25 mg total) by mouth every 6 (six) hours. 20 tablet 0  . losartan (COZAAR) 50 MG tablet Take 1 tablet (50 mg total) by mouth daily. 30 tablet 11  . megestrol (MEGACE) 40 MG tablet Take 1 tablet (40 mg total) by mouth 2 (two) times daily. (Patient taking differently: Take 40 mg by mouth daily. ) 60 tablet 1  . metoprolol succinate (TOPROL-XL) 25 MG 24 hr tablet Take 1 tablet (25 mg total) by mouth daily. 30 tablet 11  . Potassium Chloride ER 20 MEQ TBCR TAKE 1 TABLET BY MOUTH 2 TIMES DAILY. 60 tablet 0  . simvastatin (ZOCOR) 10 MG tablet Take 1 tablet (10 mg total) by mouth daily. 30 tablet 11   No current facility-administered medications for this visit.    Allergies  Allergen Reactions  . Ace Inhibitors     REACTION: Cough    Review of Systems negative except from HPI and PMH  Physical Exam BP 158/110 mmHg  Pulse 72  Ht 5\' 8"  (1.727 m)  Wt 199 lb (90.266 kg)  BMI 30.26 kg/m2 Well developed and well nourished in no acute  distress HENT normal E scleral and icterus clear Neck Supple JVP flat; carotids brisk and full Clear to ausculation  *Regular rate and rhythm, no murmurs gallops or rub Soft with active bowel sounds No clubbing cyanosis  Edema Alert and oriented, grossly normal motor and sensory function Skin Warm and Dry  ECG 24 March demonstrated sinus rhythm at 72 Intervals 18/09/44 Voltage consistent with LVH Assessment and  Plan  nonischemic cardiomyopathy  Congestive heart failure   Hypertension-resolved  Obstructive sleep apnea AHI 18   There has been interval normalization of LV systolic function. We will continue her on her current medications. Her blood pressure today is significantly elevated now 180     She has not been taking her diuretics. We will resume his prior to changing any of her other medications hypertension in the population can be salt sensitive.  I suggested she follow-up with her PCP next couple of weeks

## 2015-03-20 ENCOUNTER — Ambulatory Visit: Payer: Medicaid Other | Admitting: Obstetrics & Gynecology

## 2015-03-22 ENCOUNTER — Emergency Department (HOSPITAL_COMMUNITY): Payer: Medicaid Other

## 2015-03-22 ENCOUNTER — Emergency Department (HOSPITAL_COMMUNITY)
Admission: EM | Admit: 2015-03-22 | Discharge: 2015-03-22 | Disposition: A | Discharge status: Home / Self Care | Payer: Medicaid Other | Attending: Emergency Medicine | Admitting: Emergency Medicine

## 2015-03-22 ENCOUNTER — Encounter (HOSPITAL_COMMUNITY): Payer: Self-pay | Admitting: Emergency Medicine

## 2015-03-22 DIAGNOSIS — R109 Unspecified abdominal pain: Secondary | ICD-10-CM

## 2015-03-22 DIAGNOSIS — Z8742 Personal history of other diseases of the female genital tract: Secondary | ICD-10-CM | POA: Diagnosis not present

## 2015-03-22 DIAGNOSIS — Z9119 Patient's noncompliance with other medical treatment and regimen: Secondary | ICD-10-CM | POA: Insufficient documentation

## 2015-03-22 DIAGNOSIS — Z79899 Other long term (current) drug therapy: Secondary | ICD-10-CM | POA: Diagnosis not present

## 2015-03-22 DIAGNOSIS — Z9981 Dependence on supplemental oxygen: Secondary | ICD-10-CM | POA: Diagnosis not present

## 2015-03-22 DIAGNOSIS — Z8673 Personal history of transient ischemic attack (TIA), and cerebral infarction without residual deficits: Secondary | ICD-10-CM | POA: Diagnosis not present

## 2015-03-22 DIAGNOSIS — Z87891 Personal history of nicotine dependence: Secondary | ICD-10-CM | POA: Diagnosis not present

## 2015-03-22 DIAGNOSIS — R1030 Lower abdominal pain, unspecified: Secondary | ICD-10-CM | POA: Insufficient documentation

## 2015-03-22 DIAGNOSIS — Z3202 Encounter for pregnancy test, result negative: Secondary | ICD-10-CM | POA: Insufficient documentation

## 2015-03-22 DIAGNOSIS — I10 Essential (primary) hypertension: Secondary | ICD-10-CM | POA: Insufficient documentation

## 2015-03-22 DIAGNOSIS — R300 Dysuria: Secondary | ICD-10-CM | POA: Diagnosis not present

## 2015-03-22 DIAGNOSIS — Z7952 Long term (current) use of systemic steroids: Secondary | ICD-10-CM | POA: Insufficient documentation

## 2015-03-22 DIAGNOSIS — E785 Hyperlipidemia, unspecified: Secondary | ICD-10-CM | POA: Insufficient documentation

## 2015-03-22 DIAGNOSIS — B349 Viral infection, unspecified: Secondary | ICD-10-CM

## 2015-03-22 DIAGNOSIS — R509 Fever, unspecified: Secondary | ICD-10-CM | POA: Diagnosis present

## 2015-03-22 DIAGNOSIS — G4730 Sleep apnea, unspecified: Secondary | ICD-10-CM | POA: Diagnosis not present

## 2015-03-22 DIAGNOSIS — Z86018 Personal history of other benign neoplasm: Secondary | ICD-10-CM | POA: Insufficient documentation

## 2015-03-22 DIAGNOSIS — I5042 Chronic combined systolic (congestive) and diastolic (congestive) heart failure: Secondary | ICD-10-CM | POA: Insufficient documentation

## 2015-03-22 DIAGNOSIS — R1084 Generalized abdominal pain: Secondary | ICD-10-CM | POA: Insufficient documentation

## 2015-03-22 DIAGNOSIS — L639 Alopecia areata, unspecified: Secondary | ICD-10-CM | POA: Diagnosis not present

## 2015-03-22 LAB — CBC
HCT: 41.5 % (ref 36.0–46.0)
Hemoglobin: 14 g/dL (ref 12.0–15.0)
MCH: 28.7 pg (ref 26.0–34.0)
MCHC: 33.7 g/dL (ref 30.0–36.0)
MCV: 85.2 fL (ref 78.0–100.0)
PLATELETS: 224 10*3/uL (ref 150–400)
RBC: 4.87 MIL/uL (ref 3.87–5.11)
RDW: 13.6 % (ref 11.5–15.5)
WBC: 17.8 10*3/uL — ABNORMAL HIGH (ref 4.0–10.5)

## 2015-03-22 LAB — URINALYSIS, ROUTINE W REFLEX MICROSCOPIC
Bilirubin Urine: NEGATIVE
GLUCOSE, UA: NEGATIVE mg/dL
HGB URINE DIPSTICK: NEGATIVE
Ketones, ur: NEGATIVE mg/dL
Leukocytes, UA: NEGATIVE
Nitrite: NEGATIVE
Protein, ur: NEGATIVE mg/dL
Specific Gravity, Urine: 1.011 (ref 1.005–1.030)
Urobilinogen, UA: 0.2 mg/dL (ref 0.0–1.0)
pH: 8 (ref 5.0–8.0)

## 2015-03-22 LAB — COMPREHENSIVE METABOLIC PANEL
ALBUMIN: 3.3 g/dL — AB (ref 3.5–5.0)
ALK PHOS: 110 U/L (ref 38–126)
ALT: 24 U/L (ref 14–54)
AST: 29 U/L (ref 15–41)
Anion gap: 12 (ref 5–15)
BUN: 8 mg/dL (ref 6–20)
CALCIUM: 9.2 mg/dL (ref 8.9–10.3)
CHLORIDE: 101 mmol/L (ref 101–111)
CO2: 25 mmol/L (ref 22–32)
CREATININE: 0.81 mg/dL (ref 0.44–1.00)
GFR calc non Af Amer: >60 mL/min (ref >60)
GLUCOSE: 92 mg/dL (ref 65–99)
Potassium: 3.1 mmol/L — ABNORMAL LOW (ref 3.5–5.1)
Sodium: 138 mmol/L (ref 135–145)
Total Bilirubin: 0.7 mg/dL (ref 0.3–1.2)
Total Protein: 7 g/dL (ref 6.5–8.1)

## 2015-03-22 LAB — POC URINE PREG, ED: PREG TEST UR: NEGATIVE

## 2015-03-22 LAB — LIPASE, BLOOD: LIPASE: 28 U/L (ref 11–51)

## 2015-03-22 MED ORDER — IOHEXOL 300 MG/ML  SOLN
25.0000 mL | Freq: Once | INTRAMUSCULAR | Status: AC | PRN
  Administered 2015-03-22: 25 mL via ORAL

## 2015-03-22 MED ORDER — IOHEXOL 300 MG/ML  SOLN
100.0000 mL | Freq: Once | INTRAMUSCULAR | Status: AC | PRN
  Administered 2015-03-22: 100 mL via INTRAVENOUS

## 2015-03-22 MED ORDER — HYDROCODONE-ACETAMINOPHEN 5-325 MG PO TABS: 1.0000 | ORAL_TABLET | Freq: Four times a day (QID) | ORAL | Status: DC | PRN

## 2015-03-22 MED ORDER — ACETAMINOPHEN 325 MG PO TABS
ORAL_TABLET | ORAL | Status: AC
  Filled 2015-03-22: qty 2

## 2015-03-22 MED ORDER — ACETAMINOPHEN 325 MG PO TABS
650.0000 mg | ORAL_TABLET | Freq: Once | ORAL | Status: AC
  Administered 2015-03-22: 650 mg via ORAL

## 2015-03-22 NOTE — ED Notes (Signed)
Patient transported to CT 

## 2015-03-22 NOTE — ED Provider Notes (Signed)
CSN: [id number]     Arrival date & time 03/22/15  0022 History  By signing my name below, I, Chelsea Branch, attest that this documentation has been prepared under the direction and in the presence of Veryl Speak, MD. Electronically Signed: Julien Nordmann, ED Scribe. 03/22/2015. 2:06 AM.    Chief Complaint  Patient presents with  . Fever  . Dysuria  . Generalized Body Aches  . Abdominal Pain     The history is provided by the patient. No language interpreter was used.   HPI Comments: Chelsea Branch is a 49 y.o. female who presents to the Emergency Department complaining of constant, gradual worsening generalized body aches onset yesterday afternoon. She has associated lower suprapubic, fever, abdominal pain and dysuria. Pt denies vaginal discharge, vaginal bleeding, constipation, and blood/loose stools.   Past Medical History  Diagnosis Date  . MITRAL REGURGITATION 10/15/2008  . 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 Alopecia   . Nonischemic cardiomyopathy ([place])     iniatially presumed 2/2 peripartum with improvement and then worsening - EF 25-30% by echo 07/2013.  Marland Kitchen Noncompliance   . Tobacco abuse   . Fibroids   . Abnormal uterine bleeding   . Menorrhagia   . Peripartum cardiomyopathy   . DYSLIPIDEMIA 06/08/2010  . Chronic combined systolic and diastolic CHF (congestive heart failure) ([place]) 1994    a. 07/2013  EF of 25-30%, LV, mod LVH, diffuse hypokinesis and mild MR  . Hypertension 1994  . Stroke ([place]) 1994  . Sleep apnea 2015    CPAP 12/2013   Past Surgical History  Procedure Laterality Date  . Loop recorder implant  ~ 2000  . Tibial tuberclerplasty    . Cesarean section  1992  1994  . Tubal ligation  1994   Family History  Problem Relation Age of Onset  . Other Neg Hx   . Heart disease Neg Hx   . Cancer Maternal Grandmother     uterine  . Hypertension Sister    Social History  Substance Use Topics  . Smoking status: Former Smoker -- 0.12 packs/day for 30 years     Types: Cigarettes    Quit date: 05/24/2010  . Smokeless tobacco: Never Used  . Alcohol Use: No   OB History    Gravida Para Term Preterm AB TAB SAB Ectopic Multiple Living   2 2 2      1 3      Review of Systems  Constitutional: Positive for fever and chills.  Gastrointestinal: Positive for abdominal pain. Negative for constipation and blood in stool.  Genitourinary: Positive for dysuria. Negative for vaginal bleeding and vaginal discharge.  All other systems reviewed and are negative.     Allergies  Ace inhibitors  Home Medications   Prior to Admission medications   Medication Sig Start Date End Date Taking? Authorizing Provider  Biotin 1 MG CAPS Take 1 mg by mouth daily. 01/29/14   Boykin Nearing, MD  chlorpheniramine-HYDROcodone (TUSSIONEX PENNKINETIC ER) 10-8 MG/5ML SUER Take 5 mLs by mouth every 12 (twelve) hours as needed for cough. 11/02/14   Larene Pickett, PA-C  furosemide (LASIX) 40 MG tablet Take 1 tablet (40 mg total) by mouth daily. 03/19/15   Deboraha Sprang, MD  hydrALAZINE (APRESOLINE) 25 MG tablet TAKE 1 TABLET BY MOUTH 2 TIMES DAILY. 01/16/15   Deboraha Sprang, MD  hydrocortisone cream 1 % Apply to affected area 2 times daily 11/29/14   Okey Regal, PA-C  hydrOXYzine (ATARAX/VISTARIL) 25 MG tablet Take  1 tablet (25 mg total) by mouth every 6 (six) hours. 11/29/14   Okey Regal, PA-C  losartan (COZAAR) 50 MG tablet Take 1 tablet (50 mg total) by mouth daily. 09/12/14   Josalyn Funches, MD  megestrol (MEGACE) 40 MG tablet Take 1 tablet (40 mg total) by mouth 2 (two) times daily. Patient taking differently: Take 40 mg by mouth daily.  05/30/14   Peggy Constant, MD  metoprolol succinate (TOPROL-XL) 25 MG 24 hr tablet Take 1 tablet (25 mg total) by mouth daily. 09/12/14   Boykin Nearing, MD  Potassium Chloride ER 20 MEQ TBCR TAKE 1 TABLET BY MOUTH 2 TIMES DAILY. 03/19/15   Deboraha Sprang, MD  simvastatin (ZOCOR) 10 MG tablet Take 1 tablet (10 mg total) by mouth daily.  09/12/14   Josalyn Funches, MD   Triage vitals: BP 154/97 mmHg  Pulse 92  Temp(Src) 102.7 F (39.3 C) (Oral)  Resp 18  SpO2 94% Physical Exam  Constitutional: She is oriented to person, place, and time. She appears well-developed and well-nourished. No distress.  HENT:  Head: Normocephalic and atraumatic.  Eyes: EOM are normal.  Neck: Normal range of motion.  Cardiovascular: Normal rate, regular rhythm and normal heart sounds.   Pulmonary/Chest: Effort normal and breath sounds normal.  Abdominal: Soft. She exhibits no distension. There is tenderness. There is no rebound and no guarding.  there is TTP across entire lower abdomen.  Musculoskeletal: Normal range of motion.  Neurological: She is alert and oriented to person, place, and time.  Skin: Skin is warm and dry.  Psychiatric: She has a normal mood and affect. Judgment normal.  Nursing note and vitals reviewed.   ED Course  Procedures  DIAGNOSTIC STUDIES: Oxygen Saturation is 94% on RA, low by my interpretation.  COORDINATION OF CARE:  1:51 AM Discussed treatment plan which includes CT of abdomen with pt at bedside and pt agreed to plan.  Labs Review Labs Reviewed  CBC - Abnormal; Notable for the following:    WBC 17.8 (*)    All other components within normal limits  LIPASE, BLOOD  COMPREHENSIVE METABOLIC PANEL  URINALYSIS, ROUTINE W REFLEX MICROSCOPIC (NOT AT [facility])  POC URINE PREG, ED    Imaging Review No results found. I have personally reviewed and evaluated these images and lab results as part of my medical decision-making.   EKG Interpretation None      MDM   Final diagnoses:  None    Patient is a 49 year old female who presents with complaints of lower abdominal pain, body aches, chills, and dysuria. This started yesterday afternoon. She is initially febrile with a temp of 102.7 and workup reveals an elevated white count. She reports lower abdominal discomfort and there is mild tenderness in this  area. Due to the leukocytosis and fever, a CT scan was obtained. This revealed no evidence for appendicitis, diverticulitis, or other acute intra-abdominal process. She is now afebrile and is feeling much better.  I suspect her symptoms are related to some form of viral process. She does report some loose stools, but no vomiting. I see nothing emergent that mandates admission or intervention. She will be discharged with pain medication and when necessary return.  I personally performed the services described in this documentation, which was scribed in my presence. The recorded information has been reviewed and is accurate.      Veryl Speak, MD 03/22/15 [phone]

## 2015-03-22 NOTE — Discharge Instructions (Signed)
Ibuprofen 600 mg every 6 hours as needed for pain or fever.  Hydrocodone as prescribed as needed for pain not relieved with ibuprofen.  Return to the emergency department if you develop bloody stools, severe abdominal pain, or other new and concerning symptoms.   Abdominal Pain, Adult Many things can cause abdominal pain. Usually, abdominal pain is not caused by a disease and will improve without treatment. It can often be observed and treated at home. Your health care provider will do a physical exam and possibly order blood tests and X-rays to help determine the seriousness of your pain. However, in many cases, more time must pass before a clear cause of the pain can be found. Before that point, your health care provider may not know if you need more testing or further treatment. HOME CARE INSTRUCTIONS Monitor your abdominal pain for any changes. The following actions may help to alleviate any discomfort you are experiencing:  Only take over-the-counter or prescription medicines as directed by your health care provider.  Do not take laxatives unless directed to do so by your health care provider.  Try a clear liquid diet (broth, tea, or water) as directed by your health care provider. Slowly move to a bland diet as tolerated. SEEK MEDICAL CARE IF:  You have unexplained abdominal pain.  You have abdominal pain associated with nausea or diarrhea.  You have pain when you urinate or have a bowel movement.  You experience abdominal pain that wakes you in the night.  You have abdominal pain that is worsened or improved by eating food.  You have abdominal pain that is worsened with eating fatty foods.  You have a fever. SEEK IMMEDIATE MEDICAL CARE IF:  Your pain does not go away within 2 hours.  You keep throwing up (vomiting).  Your pain is felt only in portions of the abdomen, such as the right side or the left lower portion of the abdomen.  You pass bloody or black tarry  stools. MAKE SURE YOU:  Understand these instructions.  Will watch your condition.  Will get help right away if you are not doing well or get worse.   This information is not intended to replace advice given to you by your health care provider. Make sure you discuss any questions you have with your health care provider.   Document Released: 02/17/2005 Document Revised: 01/29/2015 Document Reviewed: 01/17/2013 Elsevier Interactive Patient Education 2016 [employer].  Fever, Adult A fever is an increase in the body's temperature. It is usually defined as a temperature of 100F (38C) or higher. Brief mild or moderate fevers generally have no long-term effects, and they often do not require treatment. Moderate or high fevers may make you feel uncomfortable and can sometimes be a sign of a serious illness or disease. The sweating that may occur with repeated or prolonged fever may also cause dehydration. Fever is confirmed by taking a temperature with a thermometer. A measured temperature can vary with:  Age.  Time of day.  Location of the thermometer:  Mouth (oral).  Rectum (rectal).  Ear (tympanic).  Underarm (axillary).  Forehead (temporal). HOME CARE INSTRUCTIONS Pay attention to any changes in your symptoms. Take these actions to help with your condition:  Take over-the counter and prescription medicines only as told by your health care provider. Follow the dosing instructions carefully.  If you were prescribed an antibiotic medicine, take it as told by your health care provider. Do not stop taking the antibiotic even if you  start to feel better.  Rest as needed.  Drink enough fluid to keep your urine clear or pale yellow. This helps to prevent dehydration.  Sponge yourself or bathe with room-temperature water to help reduce your body temperature as needed. Do not use ice water.  Do not overbundle yourself in blankets or heavy clothes. SEEK MEDICAL CARE IF:  You  vomit.  You cannot eat or drink without vomiting.  You have diarrhea.  You have pain when you urinate.  Your symptoms do not improve with treatment.  You develop new symptoms.  You develop excessive weakness. SEEK IMMEDIATE MEDICAL CARE IF:  You have shortness of breath or have trouble breathing.  You are dizzy or you faint.  You are disoriented or confused.  You develop signs of dehydration, such as a dry mouth, decreased urination, or paleness.  You develop severe pain in your abdomen.  You have persistent vomiting or diarrhea.  You develop a skin rash.  Your symptoms suddenly get worse.   This information is not intended to replace advice given to you by your health care provider. Make sure you discuss any questions you have with your health care provider.   Document Released: 11/03/2000 Document Revised: 01/29/2015 Document Reviewed: 07/04/2014 Elsevier Interactive Patient Education [employer].

## 2015-03-22 NOTE — ED Notes (Signed)
Pt. Presents with multiple complaints : fever with body aches , chills , low abdominal pain and dysuria onset yesterday afternoon .

## 2015-03-25 ENCOUNTER — Telehealth: Payer: Self-pay | Admitting: Internal Medicine

## 2015-03-25 NOTE — Telephone Encounter (Signed)
°  STAT if patient is at the pharmacy , call can be transferred to refill team.   1. Which medications need to be refilled? Hydralazine  2. Which pharmacy/location is medication to be sent to? [facility]  3. Do they need a 30 day or 90 day supply? 30 Day   Pt would like a call when this is done. Pt states it was suppose to be done when she saw Dr. Caryl Comes last week and she has been without this medication for 3 wks now.

## 2015-03-26 ENCOUNTER — Other Ambulatory Visit: Payer: Self-pay | Admitting: Internal Medicine

## 2015-03-26 MED ORDER — HYDRALAZINE HCL 25 MG PO TABS: ORAL_TABLET | ORAL | Status: DC

## 2015-03-27 ENCOUNTER — Encounter: Payer: Self-pay | Admitting: Obstetrics & Gynecology

## 2015-03-27 ENCOUNTER — Ambulatory Visit (INDEPENDENT_AMBULATORY_CARE_PROVIDER_SITE_OTHER): Payer: Medicaid Other | Admitting: Obstetrics & Gynecology

## 2015-03-27 VITALS — BP 150/80 | HR 96 | Wt 192.9 lb

## 2015-03-27 DIAGNOSIS — D259 Leiomyoma of uterus, unspecified: Secondary | ICD-10-CM | POA: Diagnosis present

## 2015-03-27 DIAGNOSIS — Z23 Encounter for immunization: Secondary | ICD-10-CM

## 2015-03-27 DIAGNOSIS — Z975 Presence of (intrauterine) contraceptive device: Secondary | ICD-10-CM

## 2015-03-27 MED ORDER — TRAMADOL HCL 50 MG PO TABS: 50.0000 mg | ORAL_TABLET | Freq: Four times a day (QID) | ORAL | Status: DC | PRN

## 2015-03-27 NOTE — Progress Notes (Signed)
[name], female   DOB: May 07, 1966, 49 y.o.   MRN: [id number]  Chief Complaint  Patient presents with  . Vaginal Itching  . Vaginal Discharge  Pelvic pain for 2 months  HPI Chelsea Branch is a 49 y.o. female.  [id number] No LMP recorded. Patient is not currently having periods (Reason: IUD). No bleeding since LMP which was not heavy ED visit 5 days ago and CT was done. IUD was in place and degenerating fibroid was seen. Notes urinary frequency and urge and dysuria with negative UA   HPI  Past Medical History  Diagnosis Date  . MITRAL REGURGITATION 10/15/2008  . Chelsea Branch-CHIARI MALFORMATION 06/08/2010  . Alopecia   . Nonischemic cardiomyopathy ([place])     iniatially presumed 2/2 peripartum with improvement and then worsening - EF 25-30% by echo 07/2013.  Marland Kitchen Noncompliance   . Tobacco abuse   . Fibroids   . Abnormal uterine bleeding   . Menorrhagia   . Peripartum cardiomyopathy   . DYSLIPIDEMIA 06/08/2010  . Chronic combined systolic and diastolic CHF (congestive heart failure) ([place]) 1994    a. 07/2013  EF of 25-30%, LV, mod LVH, diffuse hypokinesis and mild MR  . Hypertension 1994  . Stroke ([place]) 1994  . Sleep apnea 2015    CPAP 12/2013    Past Surgical History  Procedure Laterality Date  . Loop recorder implant  ~ 2000  . Tibial tuberclerplasty    . Cesarean section  1992  1994  . Tubal ligation  1994    Family History  Problem Relation Age of Onset  . Other Neg Hx   . Heart disease Neg Hx   . Cancer Maternal Grandmother     uterine  . Hypertension Sister     Social History Social History  Substance Use Topics  . Smoking status: Former Smoker -- 0.12 packs/day for 30 years    Types: Cigarettes    Quit date: 05/24/2010  . Smokeless tobacco: Never Used  . Alcohol Use: No    Allergies  Allergen Reactions  . Ace Inhibitors     REACTION: Cough    Current Outpatient Prescriptions  Medication Sig Dispense Refill  . furosemide (LASIX) 40 MG tablet Take 1  tablet (40 mg total) by mouth daily. 30 tablet 11  . hydrALAZINE (APRESOLINE) 25 MG tablet TAKE 1 TABLET BY MOUTH 2 TIMES DAILY. 60 tablet 11  . ibuprofen (ADVIL,MOTRIN) 200 MG tablet Take 400 mg by mouth every 6 (six) hours as needed for moderate pain.    Marland Kitchen losartan (COZAAR) 50 MG tablet Take 1 tablet (50 mg total) by mouth daily. 30 tablet 11  . metoprolol succinate (TOPROL-XL) 25 MG 24 hr tablet Take 1 tablet (25 mg total) by mouth daily. 30 tablet 11  . Potassium Chloride ER 20 MEQ TBCR TAKE 1 TABLET BY MOUTH 2 TIMES DAILY. 60 tablet 11  . simvastatin (ZOCOR) 10 MG tablet Take 1 tablet (10 mg total) by mouth daily. 30 tablet 11  . HYDROcodone-acetaminophen (NORCO) 5-325 MG tablet Take 1-2 tablets by mouth every 6 (six) hours as needed. (Patient not taking: Reported on 03/27/2015) 12 tablet 0  . traMADol (ULTRAM) 50 MG tablet Take 1 tablet (50 mg total) by mouth every 6 (six) hours as needed. 30 tablet 0   No current facility-administered medications for this visit.    Review of Systems Review of Systems  Constitutional: Negative.   Gastrointestinal: Positive for abdominal pain.  Genitourinary: Positive for dysuria, urgency, vaginal discharge  and pelvic pain. Negative for vaginal bleeding.    Blood pressure 150/80, pulse 96, weight 192 lb 14.4 oz (87.499 kg).  Physical Exam Physical Exam  Constitutional: She is oriented to person, place, and time. She appears well-developed. No distress.  Cardiovascular: Normal rate.   Pulmonary/Chest: Effort normal.  Abdominal: Soft. There is no tenderness.  Genitourinary:  Uterus 10-12 week size, no adnexal mass., mild s/p tenderness  Neurological: She is alert and oriented to person, place, and time.  Skin: Skin is warm and dry.  Psychiatric: She has a normal mood and affect. Her behavior is normal.    Data Reviewed  CLINICAL DATA: Acute onset of lower abdominal pain and fever. Body aches, chills and dysuria. Initial  encounter.  EXAM: CT ABDOMEN AND PELVIS WITH CONTRAST  TECHNIQUE: Multidetector CT imaging of the abdomen and pelvis was performed using the standard protocol following bolus administration of intravenous contrast.  CONTRAST: 160mL OMNIPAQUE IOHEXOL 300 MG/ML SOLN  COMPARISON: Pelvic ultrasound performed 05/13/2014  FINDINGS: Mild bibasilar atelectasis is noted. A tiny hiatal hernia is noted.  The liver and spleen are unremarkable in appearance. The gallbladder is within normal limits. The pancreas is unremarkable in appearance. Nodularity is noted at the left adrenal gland, measuring up to 1.3 cm in size.  The kidneys are unremarkable in appearance. There is no evidence of hydronephrosis. No renal or ureteral stones are seen. No perinephric stranding is appreciated.  A small amount of free fluid is noted at the right lower quadrant, of uncertain significance. The small bowel is unremarkable in appearance. The stomach is within normal limits. No acute vascular abnormalities are seen. Mild calcification is noted along the abdominal aorta and its branches.  A mildly enlarged 1.3 cm retroperitoneal node is seen, adjacent to the left common iliac artery.  The appendix is normal in caliber, without evidence of appendicitis. The colon is unremarkable in appearance.  The bladder is significantly distended and grossly unremarkable. Multiple large uterine fibroids are seen, with degeneration of a fibroid at the uterine fundus. An intrauterine device is noted below the uterine fibroids. The ovaries are grossly symmetric, with a 2.6 cm left adnexal cyst, likely physiologic in nature. No inguinal lymphadenopathy is seen.  No acute osseous abnormalities are identified.  IMPRESSION: 1. Small amount of free fluid at the right lower quadrant, of uncertain significance. Adjacent 5 mm node noted. 2. 1.3 cm mildly enlarged retroperitoneal node noted, adjacent to the  left common iliac artery, of uncertain significance. Underlying malignancy cannot be excluded. Contrast-enhanced MRI of the pelvis would be helpful to assess for uterine or ovarian malignancy. 3. Multiple large uterine fibroids noted, with degeneration of a fibroid at the uterine fundus. Intrauterine device noted below the uterine fibroids. 4. Tiny hiatal hernia noted. 5. Nodularity at the left adrenal gland, with a nodule measuring up to 1.3 cm in size. 6. Mild bibasilar atelectasis noted. 7. Mild calcification along the abdominal aorta and its branches.   Electronically Signed  By: Garald Balding M.D.  On: 03/22/2015 04:04   Images reviewed  Assessment    Pelvic pain no bleeding IUD in place strings not seen Urinary symptom could have painful bladder syndrome possible IC Vaginal discharge wet prep pending     Plan    Wet prep result Recommend urology referral Requests tramadol and rx 30 tabs given no refill        Aleysia Oltmann 03/27/2015, 3:17 PM

## 2015-03-28 LAB — WET PREP, GENITAL
Trich, Wet Prep: NONE SEEN
YEAST WET PREP: NONE SEEN

## 2015-03-31 ENCOUNTER — Encounter: Payer: Self-pay | Admitting: Family Medicine

## 2015-03-31 ENCOUNTER — Ambulatory Visit: Payer: Medicaid Other | Attending: Family Medicine | Admitting: Family Medicine

## 2015-03-31 VITALS — BP 159/95 | HR 77 | Temp 98.8°F | Resp 16 | Ht 68.0 in | Wt 196.0 lb

## 2015-03-31 DIAGNOSIS — Z0000 Encounter for general adult medical examination without abnormal findings: Secondary | ICD-10-CM | POA: Diagnosis not present

## 2015-03-31 DIAGNOSIS — M545 Low back pain, unspecified: Secondary | ICD-10-CM

## 2015-03-31 DIAGNOSIS — L659 Nonscarring hair loss, unspecified: Secondary | ICD-10-CM

## 2015-03-31 MED ORDER — MINOXIDIL 2 % EX SOLN: Freq: Two times a day (BID) | CUTANEOUS | Status: DC

## 2015-03-31 MED ORDER — CYCLOBENZAPRINE HCL 10 MG PO TABS: 10.0000 mg | ORAL_TABLET | Freq: Every evening | ORAL | Status: DC | PRN

## 2015-03-31 NOTE — Progress Notes (Signed)
[name], female   DOB: [birth year], 49 y.o.   MRN: [id number]   Subjective:  [name], female    DOB: 1965/09/04  Age: 49 y.o. MRN: [id number]  CC: Back Pain and Alopecia   HPI Chelsea Branch presents for    1. Back pain: chronic low back pain since fall 6-7 months ago following fall down stairs and landing on tailbone . Worse with prolonged standing and walking. Describes ache. Pain is 8-10/10. Pain radiates upward. Pain does not radiate down toward bottom or legs.   2. Alopecia: requesting rogaine. Hair is dry and sparse. No treatments to date.   Social History  Substance Use Topics  . Smoking status: Former Smoker -- 0.12 packs/day for 30 years    Types: Cigarettes    Quit date: 05/24/2010  . Smokeless tobacco: Never Used  . Alcohol Use: No    Outpatient Prescriptions Prior to Visit  Medication Sig Dispense Refill  . furosemide (LASIX) 40 MG tablet Take 1 tablet (40 mg total) by mouth daily. 30 tablet 11  . hydrALAZINE (APRESOLINE) 25 MG tablet TAKE 1 TABLET BY MOUTH 2 TIMES DAILY. 60 tablet 11  . ibuprofen (ADVIL,MOTRIN) 200 MG tablet Take 400 mg by mouth every 6 (six) hours as needed for moderate pain.    Marland Kitchen losartan (COZAAR) 50 MG tablet Take 1 tablet (50 mg total) by mouth daily. 30 tablet 11  . metoprolol succinate (TOPROL-XL) 25 MG 24 hr tablet Take 1 tablet (25 mg total) by mouth daily. 30 tablet 11  . Potassium Chloride ER 20 MEQ TBCR TAKE 1 TABLET BY MOUTH 2 TIMES DAILY. 60 tablet 11  . simvastatin (ZOCOR) 10 MG tablet Take 1 tablet (10 mg total) by mouth daily. 30 tablet 11  . HYDROcodone-acetaminophen (NORCO) 5-325 MG tablet Take 1-2 tablets by mouth every 6 (six) hours as needed. (Patient not taking: Reported on 03/31/2015) 12 tablet 0  . traMADol (ULTRAM) 50 MG tablet Take 1 tablet (50 mg total) by mouth every 6 (six) hours as needed. (Patient not taking: Reported on 03/31/2015) 30 tablet 0   No facility-administered medications prior to  visit.    ROS Review of Systems  Constitutional: Negative for fever and chills.  Eyes: Negative for visual disturbance.  Respiratory: Negative for shortness of breath.   Cardiovascular: Negative for chest pain.  Gastrointestinal: Negative for abdominal pain and blood in stool.  Musculoskeletal: Positive for back pain. Negative for arthralgias.  Skin: Negative for rash.  Allergic/Immunologic: Negative for immunocompromised state.  Hematological: Negative for adenopathy. Does not bruise/bleed easily.  Psychiatric/Behavioral: Negative for suicidal ideas and dysphoric mood.    Objective:  BP 159/95 mmHg  Pulse 77  Temp(Src) 98.8 F (37.1 C) (Oral)  Resp 16  Ht 5\' 8"  (1.727 m)  Wt 196 lb (88.905 kg)  BMI 29.81 kg/m2  SpO2 99%  BP/Weight 03/31/2015 03/27/2015 16/11/3708  Systolic BP 626 948 546  Diastolic BP 95 80 77  Wt. (Lbs) 196 192.9 -  BMI 29.81 29.34 -   Physical Exam  Constitutional: She is oriented to person, place, and time. She appears well-developed and well-nourished. No distress.  HENT:  Head: Normocephalic and atraumatic.  Cardiovascular: Normal rate, regular rhythm, normal heart sounds and intact distal pulses.   Pulmonary/Chest: Effort normal and breath sounds normal.  Musculoskeletal: She exhibits no edema.  Back Exam: Back: Normal Curvature, no deformities or CVA tenderness  Paraspinal Tenderness: absent   LE Strength 5/5  LE Sensation: in  tact  LE Reflexes 2+ and symmetric  Straight leg raise: negative   Neurological: She is alert and oriented to person, place, and time.  Skin: Skin is warm and dry. No rash noted.  Psychiatric: She has a normal mood and affect.     Assessment & Plan:   Problem List Items Addressed This Visit    Alopecia - Primary   Relevant Medications   minoxidil (ROGAINE) 2 % external solution   Other Relevant Orders   Ambulatory referral to Dermatology   Low back pain   Relevant Medications   cyclobenzaprine (FLEXERIL) 10  MG tablet   Other Relevant Orders   Ambulatory referral to Dermatology   Ambulatory referral to Physical Therapy   DG Lumbar Spine 2-3 Views    Other Visit Diagnoses    Healthcare maintenance        Relevant Orders    MM DIGITAL SCREENING BILATERAL       No orders of the defined types were placed in this encounter.    Follow-up: No Follow-up on file.   Boykin Nearing MD

## 2015-03-31 NOTE — Patient Instructions (Addendum)
Alexx was seen today for back pain and alopecia.  Diagnoses and all orders for this visit:  Alopecia -     Ambulatory referral to Dermatology -     minoxidil (ROGAINE) 2 % external solution; Apply topically 2 (two) times daily.  Bilateral low back pain without sciatica -     cyclobenzaprine (FLEXERIL) 10 MG tablet; Take 1 tablet (10 mg total) by mouth at bedtime as needed for muscle spasms. Back pain -     Ambulatory referral to Dermatology -     Ambulatory referral to Physical Therapy -     DG Lumbar Spine 2-3 Views; Future  Healthcare maintenance -     MM DIGITAL SCREENING BILATERAL; Future   F/u in 6 weeks for low back pain  Dr. Adrian Blackwater

## 2015-03-31 NOTE — Progress Notes (Signed)
F/U back pain  Loss hair requesting Rx Rogaine  Pain scale #9

## 2015-04-07 ENCOUNTER — Telehealth: Payer: Self-pay | Admitting: *Deleted

## 2015-04-07 MED ORDER — METRONIDAZOLE 500 MG PO TABS: 500.0000 mg | ORAL_TABLET | Freq: Two times a day (BID) | ORAL | Status: DC

## 2015-04-07 NOTE — Telephone Encounter (Signed)
Per Dr. Roselie Awkward pt has wet prep with +BV. Called pt and informed her of +BV and treatment prescribed.  Pt voiced understanding and had no questions.

## 2015-04-08 ENCOUNTER — Other Ambulatory Visit: Payer: Self-pay

## 2015-04-08 MED ORDER — METRONIDAZOLE 500 MG PO TABS: 500.0000 mg | ORAL_TABLET | Freq: Two times a day (BID) | ORAL | Status: DC

## 2015-04-08 NOTE — Progress Notes (Signed)
Pt requested her Flagyl medication switched to [facility] off [address] instead of [facility].  Rx e-prescribed.

## 2015-05-07 ENCOUNTER — Ambulatory Visit: Payer: Medicaid Other | Admitting: Family Medicine

## 2015-05-17 IMAGING — US US TRANSVAGINAL NON-OB
1 series · 14 of 25 positions shown · non-contrast
Comparison: 09/20/2013

CLINICAL DATA: Check IUD position

EXAM:
TRANSVAGINAL ULTRASOUND OF PELVIS
TECHNIQUE: Transvaginal ultrasound examination of the pelvis was performed
including evaluation of the uterus, ovaries, adnexal regions, and
pelvic cul-de-sac.

[Series 1: us transvaginal non-ob · 41 acquisitions, 14 frames shown]
[im 1/41]
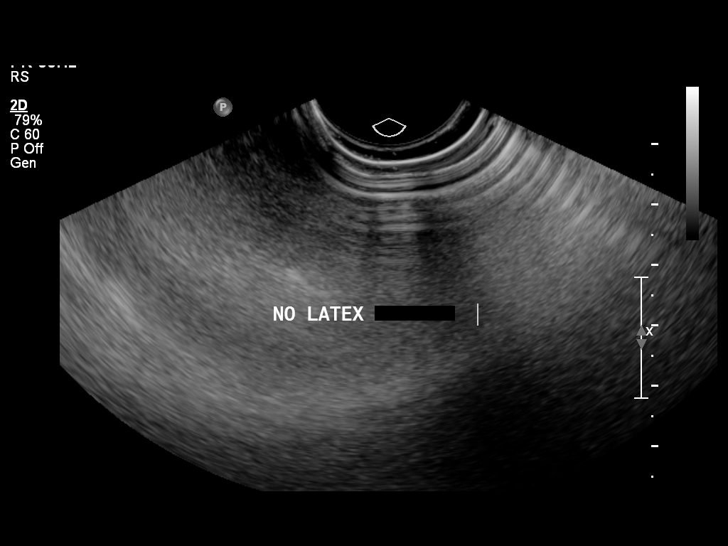
[im 4/41]
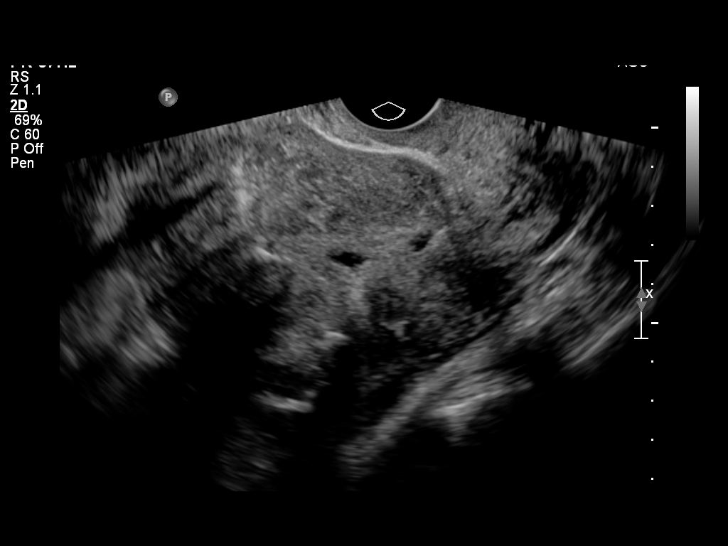
[im 7/41]
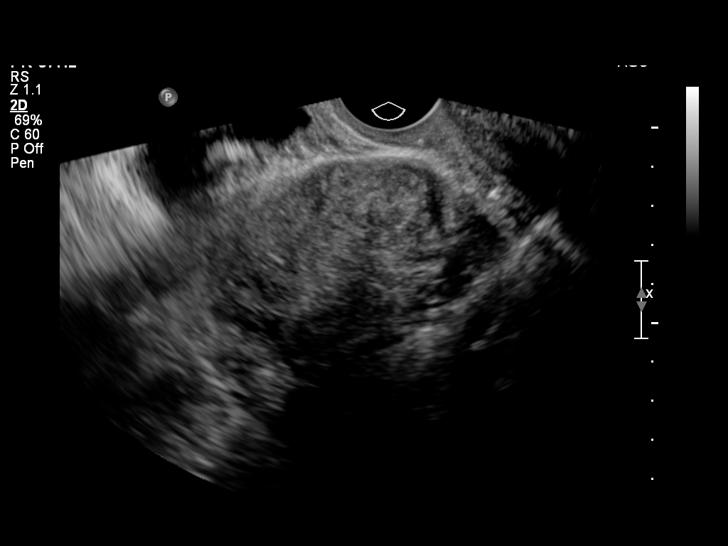
[im 11/41]
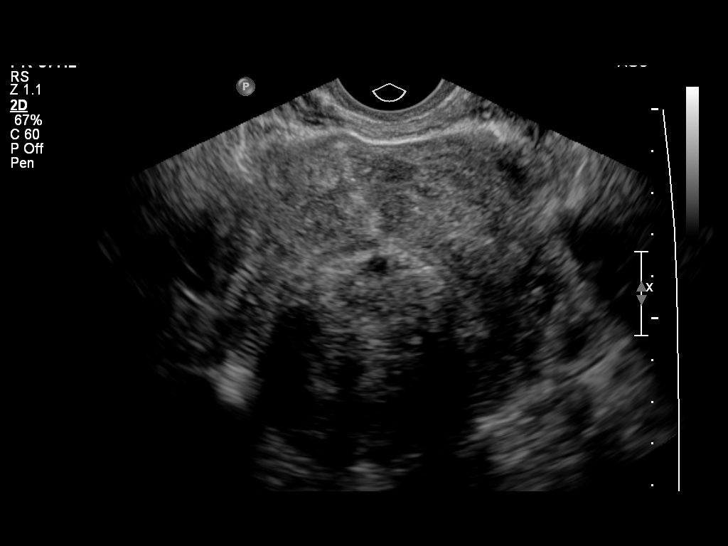
[im 14/41]
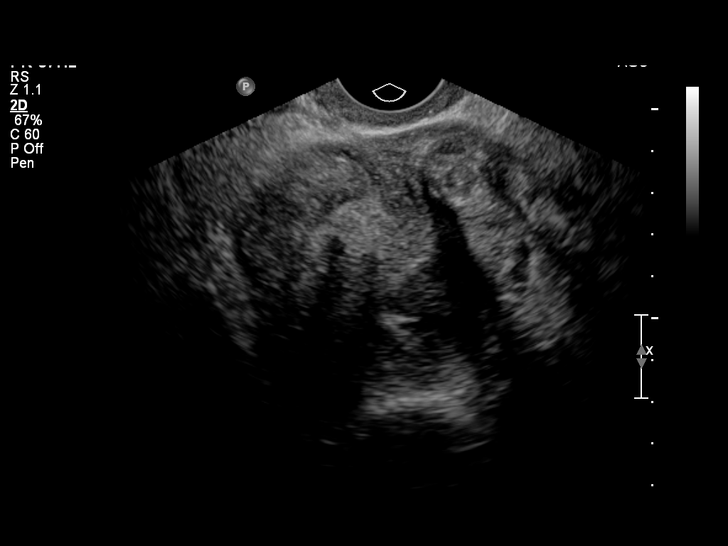
[im 16/41]
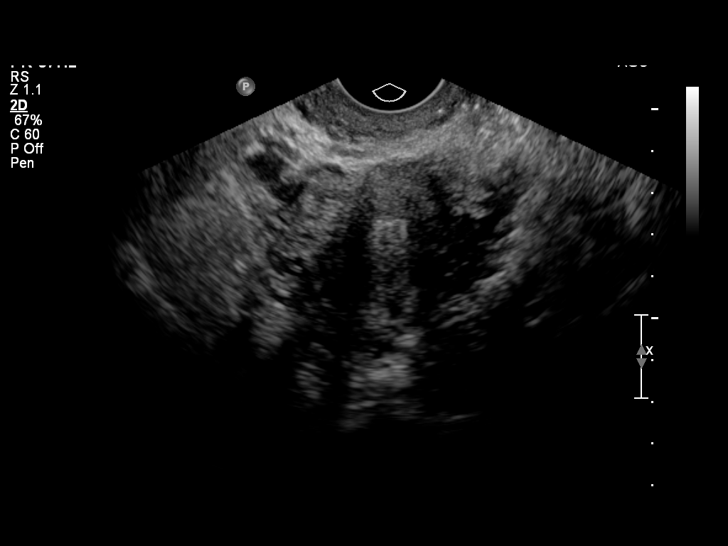
[im 19/41]
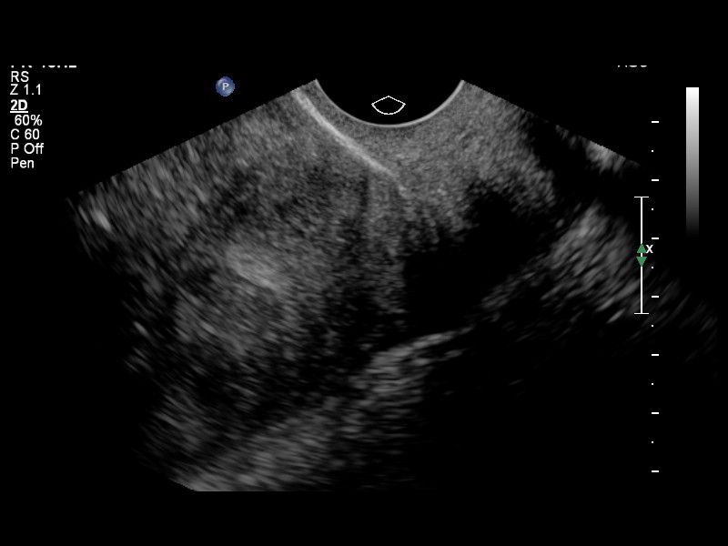
[im 22/41]
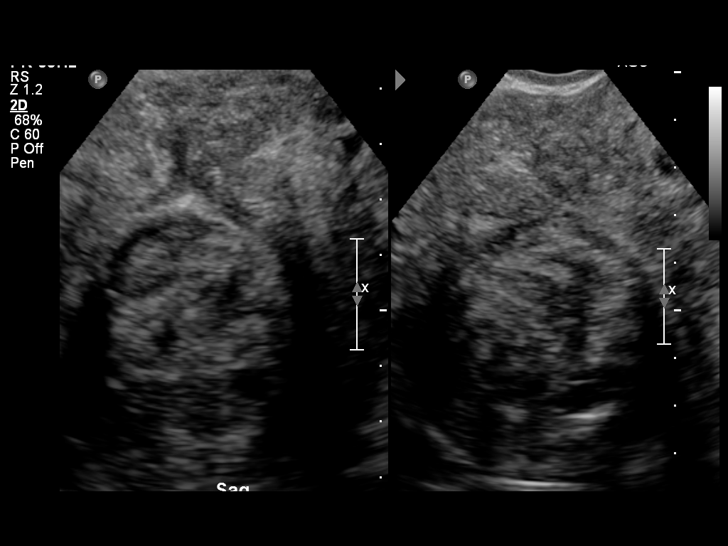
[im 26/41]
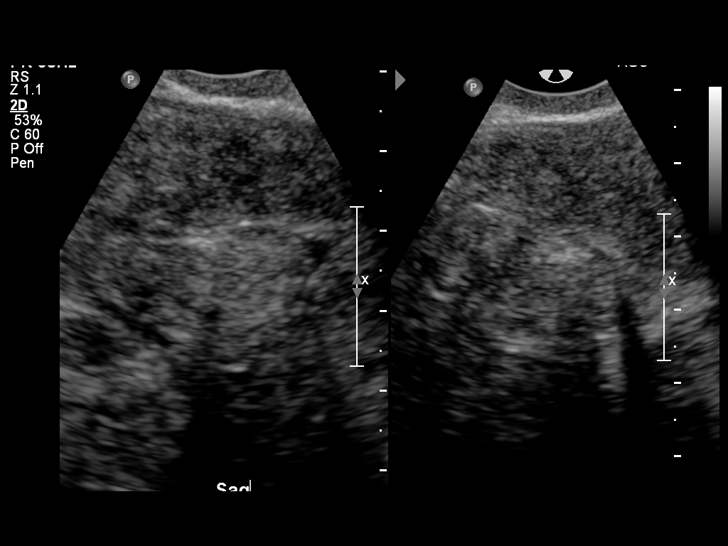
[im 27/41]
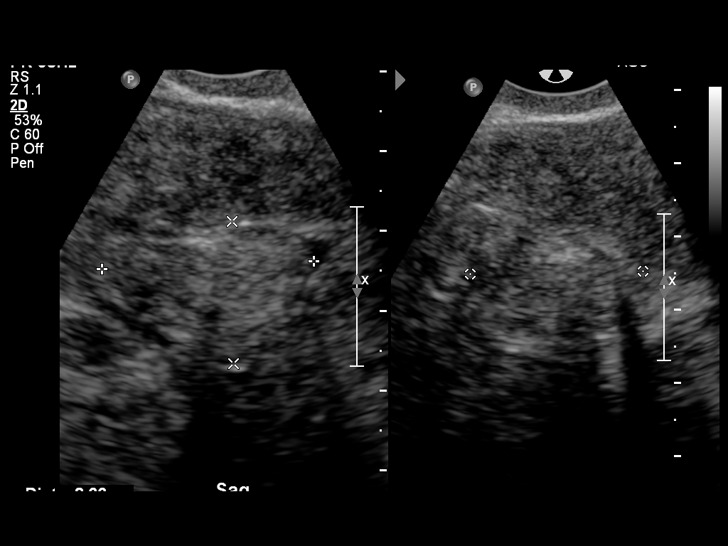
[im 31/41]
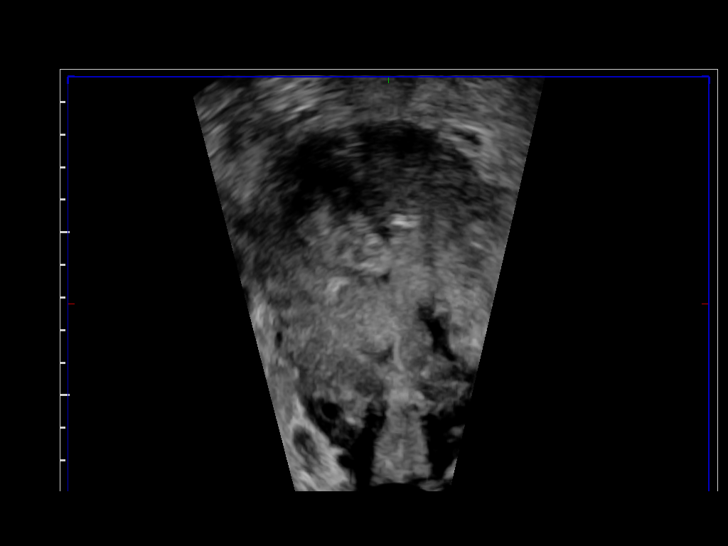
[im 34/41]
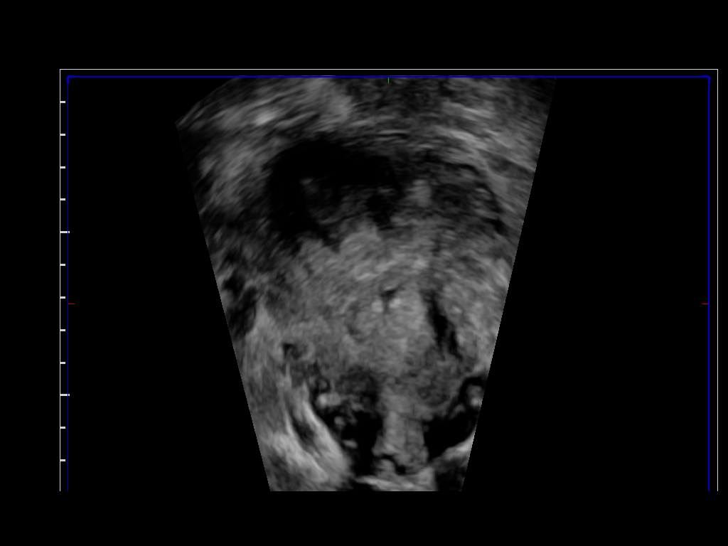
[im 37/41]
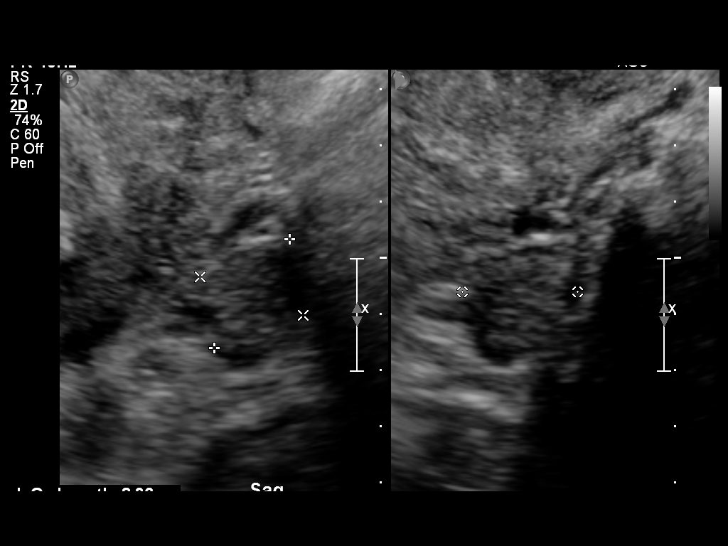
[im 41/41]
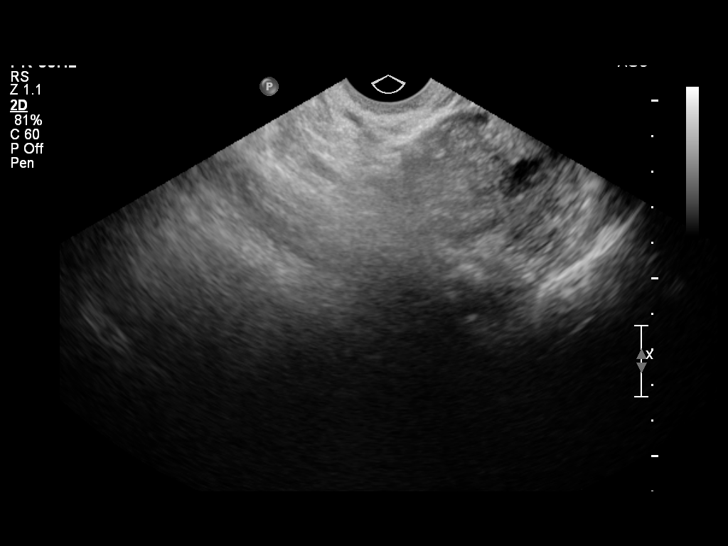

[14 of 25 positions shown; findings below may reference images not displayed]

FINDINGS: Uterus

Measurements: 11.4 x 6.7 x 7.0 cm.. Multiple uterine fibroids are
noted. The largest of these lies posteriorly measuring 4.5 cm. This
is slightly decreased in size when compared with the prior exam.

Endometrium

Thickness: Not well visualized. The IUD is not well visualized. It
was noted within the lower uterine segment on the prior exam.

Right ovary

Not well visualized.

Left ovary

Measurements: 2.4 x 0 2.0 x 2.1 cm.. Normal appearance/no adnexal
mass.

Other findings:  No free fluid
IMPRESSION: Multiple uterine fibroids relatively stable from the prior exam.

The patient IUD is not visualized. It was noted in the lower uterine
segment on the prior exam and is likely dislodged from the patient

## 2015-05-25 DIAGNOSIS — I214 Non-ST elevation (NSTEMI) myocardial infarction: Secondary | ICD-10-CM

## 2015-05-25 DIAGNOSIS — I209 Angina pectoris, unspecified: Secondary | ICD-10-CM

## 2015-05-25 HISTORY — DX: Angina pectoris, unspecified: I20.9

## 2015-05-25 HISTORY — DX: Non-ST elevation (NSTEMI) myocardial infarction: I21.4

## 2015-06-08 ENCOUNTER — Other Ambulatory Visit: Payer: Self-pay | Admitting: Internal Medicine

## 2015-06-09 ENCOUNTER — Telehealth: Payer: Self-pay

## 2015-06-09 ENCOUNTER — Emergency Department (HOSPITAL_COMMUNITY)
Admission: EM | Admit: 2015-06-09 | Discharge: 2015-06-09 | Discharge status: Against Medical Advice | Payer: Medicaid Other | Attending: Emergency Medicine | Admitting: Emergency Medicine

## 2015-06-09 ENCOUNTER — Encounter (HOSPITAL_COMMUNITY): Payer: Self-pay | Admitting: *Deleted

## 2015-06-09 DIAGNOSIS — R109 Unspecified abdominal pain: Secondary | ICD-10-CM | POA: Diagnosis not present

## 2015-06-09 DIAGNOSIS — I10 Essential (primary) hypertension: Secondary | ICD-10-CM | POA: Diagnosis not present

## 2015-06-09 DIAGNOSIS — R05 Cough: Secondary | ICD-10-CM | POA: Insufficient documentation

## 2015-06-09 DIAGNOSIS — I5042 Chronic combined systolic (congestive) and diastolic (congestive) heart failure: Secondary | ICD-10-CM | POA: Diagnosis not present

## 2015-06-09 DIAGNOSIS — Q054 Unspecified spina bifida with hydrocephalus: Secondary | ICD-10-CM | POA: Diagnosis not present

## 2015-06-09 LAB — URINALYSIS, ROUTINE W REFLEX MICROSCOPIC
BILIRUBIN URINE: NEGATIVE
Glucose, UA: NEGATIVE mg/dL
Ketones, ur: NEGATIVE mg/dL
Leukocytes, UA: NEGATIVE
NITRITE: NEGATIVE
PROTEIN: NEGATIVE mg/dL
SPECIFIC GRAVITY, URINE: 1.025 (ref 1.005–1.030)
pH: 5.5 (ref 5.0–8.0)

## 2015-06-09 LAB — URINE MICROSCOPIC-ADD ON

## 2015-06-09 LAB — COMPREHENSIVE METABOLIC PANEL
ALBUMIN: 3.3 g/dL — AB (ref 3.5–5.0)
ALK PHOS: 96 U/L (ref 38–126)
ALT: 14 U/L (ref 14–54)
ANION GAP: 8 (ref 5–15)
AST: 15 U/L (ref 15–41)
BILIRUBIN TOTAL: 0.7 mg/dL (ref 0.3–1.2)
BUN: <5 mg/dL — ABNORMAL LOW (ref 6–20)
CALCIUM: 8.7 mg/dL — AB (ref 8.9–10.3)
CO2: 24 mmol/L (ref 22–32)
Chloride: 108 mmol/L (ref 101–111)
Creatinine, Ser: 0.67 mg/dL (ref 0.44–1.00)
GFR calc Af Amer: >60 mL/min (ref >60)
GFR calc non Af Amer: >60 mL/min (ref >60)
GLUCOSE: 87 mg/dL (ref 65–99)
Potassium: 2.9 mmol/L — ABNORMAL LOW (ref 3.5–5.1)
Sodium: 140 mmol/L (ref 135–145)
TOTAL PROTEIN: 6.6 g/dL (ref 6.5–8.1)

## 2015-06-09 LAB — CBC
HCT: 40.7 % (ref 36.0–46.0)
HEMOGLOBIN: 13.5 g/dL (ref 12.0–15.0)
MCH: 28.4 pg (ref 26.0–34.0)
MCHC: 33.2 g/dL (ref 30.0–36.0)
MCV: 85.5 fL (ref 78.0–100.0)
PLATELETS: 225 10*3/uL (ref 150–400)
RBC: 4.76 MIL/uL (ref 3.87–5.11)
RDW: 14 % (ref 11.5–15.5)
WBC: 5.8 10*3/uL (ref 4.0–10.5)

## 2015-06-09 LAB — LIPASE, BLOOD: Lipase: 23 U/L (ref 11–51)

## 2015-06-09 MED FILL — POTASSIUM CL ER 20 MEQ TAB: 20 | 30 days supply | Qty: 60 | Fill #1

## 2015-06-09 MED FILL — FUROSEMIDE 40 MG TABLET: 40 | 30 days supply | Qty: 30 | Fill #1

## 2015-06-09 MED FILL — SIMVASTATIN 10 MG TABLET: 10 | 30 days supply | Qty: 30 | Fill #2

## 2015-06-09 MED FILL — LOSARTAN POTASSIUM 50 MG TA: 50 | 30 days supply | Qty: 30 | Fill #3

## 2015-06-09 MED FILL — METOPROLOL SUCC ER 25 MG TA: 25 | 30 days supply | Qty: 30 | Fill #3

## 2015-06-09 NOTE — ED Notes (Signed)
Patient called for room 3X with no response 

## 2015-06-09 NOTE — Telephone Encounter (Signed)
New message       *STAT* If patient is at the pharmacy, call can be transferred to refill team.   1. Which medications need to be refilled? (please list name of each medication and dose if known) simvastatin 10mg , hydralazine 25mg , potassium 62meq, losartan 50mg , metoprolol 25mg   2. Which pharmacy/location (including street and city if local pharmacy) is medication to be sent to[facility]  3. Do they need a 30 day or 90 day supply? 30 day Pt states she has been out of medication for 1-2 weeks

## 2015-06-09 NOTE — ED Notes (Signed)
Pt c/o intermittent left flank pain and cough for 3-4 days. Pt denies dysuria, hematuria. Pt is taking OTC with mild relief

## 2015-06-09 NOTE — Telephone Encounter (Signed)
Pt also needs Furosimide refilled

## 2015-06-09 NOTE — Telephone Encounter (Signed)
[facility] and spoke with [facility] regarding the prescriptions Chelsea Branch had requested. She stated that the Metoprolol, Losartan, Furosemide, Simvastatin, Hydralazine, and Potassium were in the process of being filled and would be ready in about 20 minutes. Called Chelsea Branch and let her know that all of the prescriptions she had requested were being filled and would be ready for pick up in about 20 minutes. She thanked me for my help.

## 2015-06-09 NOTE — Telephone Encounter (Signed)
Follow up     Pt is calling to see if [facility] pres[facility]. Please call her when presc has been called in

## 2015-06-10 ENCOUNTER — Other Ambulatory Visit: Payer: Self-pay

## 2015-06-12 ENCOUNTER — Other Ambulatory Visit: Payer: Self-pay | Admitting: Obstetrics & Gynecology

## 2015-06-13 ENCOUNTER — Other Ambulatory Visit: Payer: Self-pay | Admitting: Internal Medicine

## 2015-06-14 ENCOUNTER — Emergency Department (HOSPITAL_COMMUNITY): Payer: Medicaid Other

## 2015-06-14 ENCOUNTER — Inpatient Hospital Stay (HOSPITAL_COMMUNITY)
Admission: EM | Admit: 2015-06-14 | Discharge: 2015-06-17 | DRG: 280 | Disposition: A | Discharge status: Home / Self Care | Payer: Medicaid Other | Attending: Interventional Cardiology | Admitting: Interventional Cardiology

## 2015-06-14 ENCOUNTER — Encounter (HOSPITAL_COMMUNITY): Payer: Self-pay | Admitting: Emergency Medicine

## 2015-06-14 DIAGNOSIS — R9431 Abnormal electrocardiogram [ECG] [EKG]: Secondary | ICD-10-CM

## 2015-06-14 DIAGNOSIS — I5043 Acute on chronic combined systolic (congestive) and diastolic (congestive) heart failure: Secondary | ICD-10-CM | POA: Diagnosis not present

## 2015-06-14 DIAGNOSIS — Z91199 Patient's noncompliance with other medical treatment and regimen due to unspecified reason: Secondary | ICD-10-CM

## 2015-06-14 DIAGNOSIS — G4733 Obstructive sleep apnea (adult) (pediatric): Secondary | ICD-10-CM | POA: Diagnosis present

## 2015-06-14 DIAGNOSIS — Z91128 Patient's intentional underdosing of medication regimen for other reason: Secondary | ICD-10-CM | POA: Diagnosis not present

## 2015-06-14 DIAGNOSIS — Z8049 Family history of malignant neoplasm of other genital organs: Secondary | ICD-10-CM | POA: Diagnosis not present

## 2015-06-14 DIAGNOSIS — I5042 Chronic combined systolic (congestive) and diastolic (congestive) heart failure: Secondary | ICD-10-CM | POA: Diagnosis present

## 2015-06-14 DIAGNOSIS — Z8249 Family history of ischemic heart disease and other diseases of the circulatory system: Secondary | ICD-10-CM | POA: Diagnosis not present

## 2015-06-14 DIAGNOSIS — R059 Cough, unspecified: Secondary | ICD-10-CM

## 2015-06-14 DIAGNOSIS — R7989 Other specified abnormal findings of blood chemistry: Secondary | ICD-10-CM | POA: Diagnosis not present

## 2015-06-14 DIAGNOSIS — Z9119 Patient's noncompliance with other medical treatment and regimen: Secondary | ICD-10-CM

## 2015-06-14 DIAGNOSIS — R778 Other specified abnormalities of plasma proteins: Secondary | ICD-10-CM

## 2015-06-14 DIAGNOSIS — I428 Other cardiomyopathies: Secondary | ICD-10-CM | POA: Diagnosis present

## 2015-06-14 DIAGNOSIS — R05 Cough: Secondary | ICD-10-CM

## 2015-06-14 DIAGNOSIS — Z8673 Personal history of transient ischemic attack (TIA), and cerebral infarction without residual deficits: Secondary | ICD-10-CM

## 2015-06-14 DIAGNOSIS — I429 Cardiomyopathy, unspecified: Secondary | ICD-10-CM

## 2015-06-14 DIAGNOSIS — I5033 Acute on chronic diastolic (congestive) heart failure: Secondary | ICD-10-CM

## 2015-06-14 DIAGNOSIS — F17210 Nicotine dependence, cigarettes, uncomplicated: Secondary | ICD-10-CM | POA: Diagnosis present

## 2015-06-14 DIAGNOSIS — I5021 Acute systolic (congestive) heart failure: Secondary | ICD-10-CM | POA: Diagnosis not present

## 2015-06-14 DIAGNOSIS — I10 Essential (primary) hypertension: Secondary | ICD-10-CM | POA: Diagnosis present

## 2015-06-14 DIAGNOSIS — T50906A Underdosing of unspecified drugs, medicaments and biological substances, initial encounter: Secondary | ICD-10-CM | POA: Diagnosis present

## 2015-06-14 DIAGNOSIS — Z888 Allergy status to other drugs, medicaments and biological substances status: Secondary | ICD-10-CM

## 2015-06-14 DIAGNOSIS — I420 Dilated cardiomyopathy: Secondary | ICD-10-CM

## 2015-06-14 DIAGNOSIS — I252 Old myocardial infarction: Secondary | ICD-10-CM | POA: Diagnosis present

## 2015-06-14 DIAGNOSIS — R0602 Shortness of breath: Secondary | ICD-10-CM | POA: Diagnosis present

## 2015-06-14 DIAGNOSIS — I110 Hypertensive heart disease with heart failure: Secondary | ICD-10-CM | POA: Diagnosis present

## 2015-06-14 DIAGNOSIS — R319 Hematuria, unspecified: Secondary | ICD-10-CM | POA: Diagnosis not present

## 2015-06-14 DIAGNOSIS — I214 Non-ST elevation (NSTEMI) myocardial infarction: Principal | ICD-10-CM | POA: Diagnosis present

## 2015-06-14 DIAGNOSIS — E785 Hyperlipidemia, unspecified: Secondary | ICD-10-CM | POA: Diagnosis present

## 2015-06-14 HISTORY — DX: Acute on chronic combined systolic (congestive) and diastolic (congestive) heart failure: I50.43

## 2015-06-14 HISTORY — DX: Patient's noncompliance with other medical treatment and regimen: Z91.19

## 2015-06-14 HISTORY — DX: Nonscarring hair loss, unspecified: L65.9

## 2015-06-14 HISTORY — DX: Non-ST elevation (NSTEMI) myocardial infarction: I21.4

## 2015-06-14 HISTORY — DX: Angina pectoris, unspecified: I20.9

## 2015-06-14 HISTORY — DX: Anemia, unspecified: D64.9

## 2015-06-14 HISTORY — DX: Patient's noncompliance with other medical treatment and regimen due to unspecified reason: Z91.199

## 2015-06-14 LAB — PROTIME-INR
INR: 1.06 (ref 0.00–1.49)
PROTHROMBIN TIME: 14 s (ref 11.6–15.2)

## 2015-06-14 LAB — BRAIN NATRIURETIC PEPTIDE: B Natriuretic Peptide: 858.2 pg/mL — ABNORMAL HIGH (ref 0.0–100.0)

## 2015-06-14 LAB — BASIC METABOLIC PANEL
Anion gap: 13 (ref 5–15)
BUN: 12 mg/dL (ref 6–20)
CALCIUM: 9.1 mg/dL (ref 8.9–10.3)
CHLORIDE: 104 mmol/L (ref 101–111)
CO2: 23 mmol/L (ref 22–32)
CREATININE: 0.77 mg/dL (ref 0.44–1.00)
GFR calc non Af Amer: >60 mL/min (ref >60)
GLUCOSE: 112 mg/dL — AB (ref 65–99)
Potassium: 3.6 mmol/L (ref 3.5–5.1)
Sodium: 140 mmol/L (ref 135–145)

## 2015-06-14 LAB — HEPARIN LEVEL (UNFRACTIONATED): Heparin Unfractionated: 0.15 IU/mL — ABNORMAL LOW (ref 0.30–0.70)

## 2015-06-14 LAB — CBC
HEMATOCRIT: 43.4 % (ref 36.0–46.0)
HEMOGLOBIN: 14.6 g/dL (ref 12.0–15.0)
MCH: 28.6 pg (ref 26.0–34.0)
MCHC: 33.6 g/dL (ref 30.0–36.0)
MCV: 84.9 fL (ref 78.0–100.0)
Platelets: 277 10*3/uL (ref 150–400)
RBC: 5.11 MIL/uL (ref 3.87–5.11)
RDW: 14.2 % (ref 11.5–15.5)
WBC: 7.5 10*3/uL (ref 4.0–10.5)

## 2015-06-14 LAB — RAPID URINE DRUG SCREEN, HOSP PERFORMED
AMPHETAMINES: NOT DETECTED
BENZODIAZEPINES: NOT DETECTED
Barbiturates: NOT DETECTED
COCAINE: NOT DETECTED
OPIATES: NOT DETECTED
TETRAHYDROCANNABINOL: NOT DETECTED

## 2015-06-14 LAB — HEPATIC FUNCTION PANEL
ALBUMIN: 3.7 g/dL (ref 3.5–5.0)
ALK PHOS: 109 U/L (ref 38–126)
ALT: 27 U/L (ref 14–54)
AST: 34 U/L (ref 15–41)
BILIRUBIN INDIRECT: 0.9 mg/dL (ref 0.3–0.9)
Bilirubin, Direct: 0.2 mg/dL (ref 0.1–0.5)
TOTAL PROTEIN: 7.3 g/dL (ref 6.5–8.1)
Total Bilirubin: 1.1 mg/dL (ref 0.3–1.2)

## 2015-06-14 LAB — I-STAT TROPONIN, ED: Troponin i, poc: 0.63 ng/mL (ref 0.00–0.08)

## 2015-06-14 LAB — TROPONIN I
TROPONIN I: 8.12 ng/mL — AB (ref <0.031)
Troponin I: 1.17 ng/mL (ref <0.031)
Troponin I: 2.49 ng/mL (ref <0.031)
Troponin I: 9.43 ng/mL (ref <0.031)

## 2015-06-14 LAB — MRSA PCR SCREENING: MRSA by PCR: NEGATIVE

## 2015-06-14 LAB — T4, FREE: FREE T4: 0.94 ng/dL (ref 0.61–1.12)

## 2015-06-14 LAB — TSH: TSH: 0.638 u[IU]/mL (ref 0.350–4.500)

## 2015-06-14 MED ORDER — ASPIRIN EC 81 MG PO TBEC
81.0000 mg | DELAYED_RELEASE_TABLET | Freq: Every day | ORAL | Status: DC
  Administered 2015-06-15 – 2015-06-17 (×2): 81 mg via ORAL
  Filled 2015-06-14 (×2): qty 1

## 2015-06-14 MED ORDER — ONDANSETRON HCL 4 MG/2ML IJ SOLN: 4.0000 mg | Freq: Four times a day (QID) | INTRAMUSCULAR | Status: DC | PRN

## 2015-06-14 MED ORDER — POTASSIUM CHLORIDE CRYS ER 20 MEQ PO TBCR
20.0000 meq | EXTENDED_RELEASE_TABLET | Freq: Two times a day (BID) | ORAL | Status: DC
  Administered 2015-06-14 – 2015-06-17 (×6): 20 meq via ORAL
  Filled 2015-06-14 (×6): qty 1

## 2015-06-14 MED ORDER — HEPARIN (PORCINE) IN NACL 100-0.45 UNIT/ML-% IJ SOLN
1450.0000 [IU]/h | INTRAMUSCULAR | Status: DC
  Administered 2015-06-14: 1050 [IU]/h via INTRAVENOUS
  Administered 2015-06-15: 1450 [IU]/h via INTRAVENOUS
  Filled 2015-06-14 (×2): qty 250

## 2015-06-14 MED ORDER — SODIUM CHLORIDE 0.9 % IJ SOLN: 3.0000 mL | Freq: Two times a day (BID) | INTRAMUSCULAR | Status: DC

## 2015-06-14 MED ORDER — FUROSEMIDE 40 MG PO TABS
40.0000 mg | ORAL_TABLET | Freq: Every day | ORAL | Status: DC
  Administered 2015-06-14 – 2015-06-16 (×3): 40 mg via ORAL
  Filled 2015-06-14 (×3): qty 1

## 2015-06-14 MED ORDER — POTASSIUM CHLORIDE CRYS ER 20 MEQ PO TBCR
40.0000 meq | EXTENDED_RELEASE_TABLET | Freq: Once | ORAL | Status: AC
  Administered 2015-06-14: 40 meq via ORAL
  Filled 2015-06-14: qty 2

## 2015-06-14 MED ORDER — HEPARIN BOLUS VIA INFUSION
4000.0000 [IU] | Freq: Once | INTRAVENOUS | Status: AC
  Administered 2015-06-14: 4000 [IU] via INTRAVENOUS
  Filled 2015-06-14: qty 4000

## 2015-06-14 MED ORDER — METOPROLOL SUCCINATE ER 25 MG PO TB24
25.0000 mg | ORAL_TABLET | Freq: Every day | ORAL | Status: DC
Start: 2015-06-14 — End: 2015-06-16
  Administered 2015-06-14 – 2015-06-16 (×3): 25 mg via ORAL
  Filled 2015-06-14 (×3): qty 1

## 2015-06-14 MED ORDER — SODIUM CHLORIDE 0.9 % IJ SOLN: 3.0000 mL | INTRAMUSCULAR | Status: DC | PRN

## 2015-06-14 MED ORDER — NITROGLYCERIN 0.4 MG SL SUBL: 0.4000 mg | SUBLINGUAL_TABLET | SUBLINGUAL | Status: DC | PRN

## 2015-06-14 MED ORDER — SODIUM CHLORIDE 0.9 % IV SOLN: 250.0000 mL | INTRAVENOUS | Status: DC | PRN

## 2015-06-14 MED ORDER — ASPIRIN 81 MG PO CHEW
324.0000 mg | CHEWABLE_TABLET | ORAL | Status: AC
  Administered 2015-06-14: 324 mg via ORAL
  Filled 2015-06-14: qty 4

## 2015-06-14 MED ORDER — ACETAMINOPHEN 325 MG PO TABS: 650.0000 mg | ORAL_TABLET | ORAL | Status: DC | PRN

## 2015-06-14 MED ORDER — CYCLOBENZAPRINE HCL 10 MG PO TABS: 10.0000 mg | ORAL_TABLET | Freq: Every evening | ORAL | Status: DC | PRN

## 2015-06-14 MED ORDER — SIMVASTATIN 10 MG PO TABS
10.0000 mg | ORAL_TABLET | Freq: Every day | ORAL | Status: DC
  Administered 2015-06-14 – 2015-06-17 (×4): 10 mg via ORAL
  Filled 2015-06-14 (×4): qty 1

## 2015-06-14 MED ORDER — LOSARTAN POTASSIUM 50 MG PO TABS
50.0000 mg | ORAL_TABLET | Freq: Every day | ORAL | Status: DC
  Administered 2015-06-14 – 2015-06-17 (×4): 50 mg via ORAL
  Filled 2015-06-14 (×4): qty 1

## 2015-06-14 MED ORDER — HYDRALAZINE HCL 25 MG PO TABS: 25.0000 mg | ORAL_TABLET | Freq: Two times a day (BID) | ORAL | Status: DC

## 2015-06-14 MED ORDER — FUROSEMIDE 10 MG/ML IJ SOLN
60.0000 mg | Freq: Once | INTRAMUSCULAR | Status: AC
  Administered 2015-06-14: 60 mg via INTRAVENOUS
  Filled 2015-06-14: qty 6

## 2015-06-14 MED ORDER — ASPIRIN 300 MG RE SUPP: 300.0000 mg | RECTAL | Status: AC

## 2015-06-14 NOTE — Progress Notes (Signed)
ANTICOAGULATION CONSULT NOTE - Initial Consult  Pharmacy Consult for heparin Indication: ACS / STEMI  Allergies  Allergen Reactions  . Ace Inhibitors     REACTION: Cough    Patient Measurements: TBW 89.4 kg IBW 64 kg Heparin Dosing Weight: 83 kg  Vital Signs: Temp: 98.3 F (36.8 C) (01/21 0727) Temp Source: Oral (01/21 0727) BP: 138/94 mmHg (01/21 0727) Pulse Rate: 91 (01/21 0727)  Labs:  Recent Labs  06/14/15 0347 06/14/15 0403  HGB 14.6  --   HCT 43.4  --   PLT 277  --   CREATININE 0.77  --   TROPONINI  --  1.17*    Estimated Creatinine Clearance: 98.4 mL/min (by C-G formula based on Cr of 0.77).   Medical History: Past Medical History  Diagnosis Date  . 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 Alopecia   . Nonischemic cardiomyopathy ([place])     a. iniatially presumed 2/2 peripartum in 1994 with improvement in 2008 and then worsening EF in 2011 back down to EF 25-30%. b. echo 01/21/14 showed mod LVH, [name]  . Noncompliance   . Tobacco abuse   . Fibroids   . Abnormal uterine bleeding   . Menorrhagia   . Peripartum cardiomyopathy   . DYSLIPIDEMIA   . Chronic combined systolic and diastolic CHF (congestive heart failure) ([place])   . Hypertension   . Stroke [facility]) 2011  . Sleep apnea 2015    CPAP 12/2013  . Fibroids   . Anemia   . H/O noncompliance with medical treatment, presenting hazards to health     Assessment: 50 yo F presents on 1/21 with SOB. Found to have elevated troponins. Pharmacy consulted to dose heparin. PMH includes CHF, dyslipidemia, HTN, Stroke. No anticoag PTA. CBC stable, no s/s of bleed.  Goal of Therapy:  Heparin level 0.3-0.7 units/ml Monitor platelets by anticoagulation protocol: Yes   Plan:  Give 4,000 unit heparin BOLUS Start heparin gtt at 1,050 units/hr Check 6 hr HL Monitor daily HL, CBC, s/s of bleed  Elenor Quinones, PharmD, BCPS Clinical Pharmacist Pager [phone] 06/14/2015 8:22 AM

## 2015-06-14 NOTE — ED Provider Notes (Signed)
CSN: [id number]     Arrival date & time 06/14/15  0301 History  By signing my name below, I, Altamease Oiler, attest that this documentation has been prepared under the direction and in the presence of Jola Schmidt, MD. Electronically Signed: Altamease Oiler, ED Scribe. 06/14/2015. 4:04 AM    Chief Complaint  Patient presents with  . Shortness of Breath  . Chest Pain  . Cough  . Nasal Congestion    The history is provided by the patient. No language interpreter was used.   Chelsea Branch is a 50 y.o. female with PMHx of nonischemic cardiomyopathy, CHF, and HTN  who presents to the Emergency Department complaining of constant and worsening SOB with onset 2 weeks ago. Her breathing is worse with laying flat and she has to sleep sitting upright.  Associated symptoms include chest pain, cough productive of clear sputum, and nasal congestion. Pt denies fever, chills, abdominal pain, nausea, vomiting. She is a smoker but notes cutting down in recent weeks.  Denies unilateral leg swelling.  No history DVT or pulmonary embolism.  Past Medical History  Diagnosis Date  . MITRAL REGURGITATION 10/15/2008  . 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 Alopecia   . Nonischemic cardiomyopathy ([place])     iniatially presumed 2/2 peripartum with improvement and then worsening - EF 25-30% by echo 07/2013.  Marland Kitchen Noncompliance   . Tobacco abuse   . Fibroids   . Abnormal uterine bleeding   . Menorrhagia   . Peripartum cardiomyopathy   . DYSLIPIDEMIA 06/08/2010  . Chronic combined systolic and diastolic CHF (congestive heart failure) ([place]) 1994    a. 07/2013  EF of 25-30%, LV, mod LVH, diffuse hypokinesis and mild MR  . Hypertension 1994  . Stroke ([place]) 1994  . Sleep apnea 2015    CPAP 12/2013   Past Surgical History  Procedure Laterality Date  . Loop recorder implant  ~ 2000  . Tibial tuberclerplasty    . Cesarean section  1992  1994  . Tubal ligation  1994   Family History  Problem Relation Age of Onset   . Other Neg Hx   . Heart disease Neg Hx   . Cancer Maternal Grandmother     uterine  . Hypertension Sister    Social History  Substance Use Topics  . Smoking status: Current Every Day Smoker -- 0.10 packs/day for 30 years    Types: Cigarettes  . Smokeless tobacco: Never Used  . Alcohol Use: No   OB History    Gravida Para Term Preterm AB TAB SAB Ectopic Multiple Living   2 2 2      1 3      Review of Systems 10 Systems reviewed and all are negative for acute change except as noted in the HPI.  Allergies  Ace inhibitors  Home Medications   Prior to Admission medications   Medication Sig Start Date End Date Taking? Authorizing Provider  cyclobenzaprine (FLEXERIL) 10 MG tablet Take 1 tablet (10 mg total) by mouth at bedtime as needed for muscle spasms. Back pain 03/31/15   Boykin Nearing, MD  furosemide (LASIX) 40 MG tablet Take 1 tablet (40 mg total) by mouth daily. 03/19/15   Deboraha Sprang, MD  hydrALAZINE (APRESOLINE) 25 MG tablet TAKE 1 TABLET BY MOUTH 2 TIMES DAILY. 03/26/15   Deboraha Sprang, MD  losartan (COZAAR) 50 MG tablet Take 1 tablet (50 mg total) by mouth daily. 09/12/14   Josalyn Funches, MD  metoprolol succinate (TOPROL-XL) 25 MG  24 hr tablet Take 1 tablet (25 mg total) by mouth daily. 09/12/14   Josalyn Funches, MD  metroNIDAZOLE (FLAGYL) 500 MG tablet Take 1 tablet (500 mg total) by mouth 2 (two) times daily. 04/08/15   Woodroe Mode, MD  minoxidil (ROGAINE) 2 % external solution Apply topically 2 (two) times daily. 03/31/15   Boykin Nearing, MD  Potassium Chloride ER 20 MEQ TBCR TAKE 1 TABLET BY MOUTH 2 TIMES DAILY. 03/19/15   Deboraha Sprang, MD  simvastatin (ZOCOR) 10 MG tablet Take 1 tablet (10 mg total) by mouth daily. 09/12/14   Josalyn Funches, MD  traMADol (ULTRAM) 50 MG tablet Take 1 tablet (50 mg total) by mouth every 6 (six) hours as needed. Patient not taking: Reported on 03/31/2015 03/27/15   Woodroe Mode, MD   BP 162/116 mmHg  Pulse 102  Temp(Src)  97.9 F (36.6 C)  Resp 24  SpO2 95% Physical Exam  Constitutional: She is oriented to person, place, and time. She appears well-developed and well-nourished. No distress.  HENT:  Head: Normocephalic and atraumatic.  Eyes: EOM are normal.  Neck: Normal range of motion.  Cardiovascular: Normal rate, regular rhythm and normal heart sounds.   Pulmonary/Chest: Effort normal. She has wheezes. She has rales.  Mild wheezing bilaterally  Abdominal: Soft. She exhibits no distension. There is no tenderness.  Musculoskeletal: Normal range of motion.  Neurological: She is alert and oriented to person, place, and time.  Skin: Skin is warm and dry.  Psychiatric: She has a normal mood and affect. Judgment normal.  Nursing note and vitals reviewed.   ED Course  Procedures (including critical care time) DIAGNOSTIC STUDIES: Oxygen Saturation is 95% on RA,  normal by my interpretation.    COORDINATION OF CARE: 4:03 AM Discussed treatment plan which includes lab work, CXR, EKG with pt at bedside and pt agreed to plan.  Labs Review Labs Reviewed  BASIC METABOLIC PANEL - Abnormal; Notable for the following:    Glucose, Bld 112 (*)    All other components within normal limits  TROPONIN I - Abnormal; Notable for the following:    Troponin I 1.17 (*)    All other components within normal limits  I-STAT TROPOININ, ED - Abnormal; Notable for the following:    Troponin i, poc 0.63 (*)    All other components within normal limits  CBC  BRAIN NATRIURETIC PEPTIDE    Imaging Review Dg Chest 2 View  06/14/2015  CLINICAL DATA:  50 year old female with productive cough EXAM: CHEST  2 VIEW COMPARISON:  Radiograph dated 11/02/2014 FINDINGS: Two views of the chest demonstrate bibasilar interstitial prominence, likely atelectatic changes. Atypical pneumonia is less likely but not excluded. There is no focal consolidation, pleural effusion, or pneumothorax. Stable cardiac silhouette. The osseous structures  appear unremarkable. IMPRESSION: No focal consolidation. Electronically Signed   By: Anner Crete M.D.   On: 06/14/2015 03:57   I have personally reviewed and evaluated these images and lab results as part of my medical decision-making.   EKG Interpretation   Date/Time:  Saturday June 14 2015 03:11:56 EST Ventricular Rate:  102 PR Interval:  156 QRS Duration: 90 QT Interval:  370 QTC Calculation: 482 R Axis:   61 Text Interpretation:  Sinus tachycardia Right atrial enlargement  nonspecific inferior lateral ST and T wave changes No significant change  was found as compared to prior ecg Confirmed by Hidaya Daniel  MD, Herold Salguero ([id number])  on 06/14/2015 3:50:48 AM  MDM   Final diagnoses:  Acute on chronic diastolic CHF (congestive heart failure) (HCC)  Elevated troponin    Symptoms of shortness of breath and orthopnea consistent with congestive heart failure.  Patient benefit from IV diuresis and repeat echocardiogram.  Doubt PE.  On my valuation the chest x-ray there appears to be more vascular congestion appreciated by radiology.  Low suspicion for pneumonia.  I personally performed the services described in this documentation, which was scribed in my presence. The recorded information has been reviewed and is accurate.        Jola Schmidt, MD 06/14/15 [phone]

## 2015-06-14 NOTE — ED Notes (Signed)
Pt has not taken her BP meds x 3 days.

## 2015-06-14 NOTE — Plan of Care (Signed)
Problem: Food- and Nutrition-Related Knowledge Deficit (NB-1.1) Goal: Nutrition education Formal process to instruct or train a patient/client in a skill or to impart knowledge to help patients/clients voluntarily manage or modify food choices and eating behavior to maintain or improve health. Outcome: Adequate for Discharge Nutrition Education Note  RD consulted for nutrition education regarding onset CHF. Pt says she drinks fluids ad lib at home and is very thirsty due to her diuretic therapy. Suggested she try using ice chips to slow her intake and  track her fluid intake daily and with goal intake per MD. She is aware that some of her food choices are high sodium such as mac and cheeze and french fries, fried chicken. She also eats process deli meats. We talked about reading labels and limiting sodium intake to 500 mg per meal.   RD provided Low Sodium" diet education. Reviewed patient's dietary recall. Provided examples on ways to decrease sodium intake in diet. Discouraged intake of processed foods and use of salt shaker. Encouraged fresh fruits and vegetables as well as whole grain sources of carbohydrates to maximize fiber intake.   RD discussed why it is important for patient to adhere to diet recommendations, and emphasized the role of fluids, foods to avoid, and importance of weighing self daily. Teach back method used.  Expect good compliance.  Body mass index is 29 kg/(m^2). Pt meets criteria for overweight based on current BMI.  Current diet order is Low Sodium, patient is consuming approximately 50-75% of meals at this time. Labs and medications reviewed. No further nutrition interventions warranted at this time.   Colman Cater MS,RD,CSG,LDN Office: [phone] Pager: [phone]

## 2015-06-14 NOTE — Progress Notes (Signed)
[facility] for heparin Indication: ACS / STEMI  Allergies  Allergen Reactions  . Ace Inhibitors     REACTION: Cough    Patient Measurements: TBW 89.4 kg IBW 64 kg Heparin Dosing Weight: 83 kg  Vital Signs: Temp: 97.8 F (36.6 C) (01/21 1041) Temp Source: Oral (01/21 1041) BP: 122/89 mmHg (01/21 1200) Pulse Rate: 88 (01/21 1200)  Labs:  Recent Labs  06/14/15 0347 06/14/15 0403 06/14/15 0827 06/14/15 1539  HGB 14.6  --   --   --   HCT 43.4  --   --   --   PLT 277  --   --   --   LABPROT  --   --  14.0  --   INR  --   --  1.06  --   HEPARINUNFRC  --   --   --  0.15*  CREATININE 0.77  --   --   --   TROPONINI  --  1.17* 2.49*  --     Estimated Creatinine Clearance: 96.8 mL/min (by C-G formula based on Cr of 0.77).   Medical History: Past Medical History  Diagnosis Date  . 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 Alopecia   . Nonischemic cardiomyopathy ([place])     a. iniatially presumed 2/2 peripartum in 1994 with improvement in 2008 and then worsening EF in 2011 back down to EF 25-30%. b. echo 01/21/14 showed mod LVH, [name]  . Noncompliance   . Tobacco abuse   . Fibroids   . Abnormal uterine bleeding   . Menorrhagia   . Peripartum cardiomyopathy   . DYSLIPIDEMIA   . Chronic combined systolic and diastolic CHF (congestive heart failure) ([place])   . Hypertension   . Stroke [facility]) 2011  . Sleep apnea 2015    CPAP 12/2013  . Fibroids   . Anemia   . H/O noncompliance with medical treatment, presenting hazards to health     Assessment: 50 yo F presents on 1/21 with SOB. Found to have elevated troponins. Pharmacy consulted to dose heparin. PMH includes CHF, dyslipidemia, HTN, Stroke. No anticoag PTA. CBC stable.  Initial HL subtherapeutic (0.15) on 1050 units/h. No IV line/Bleed issues per RN.  Goal of Therapy:  Heparin level 0.3-0.7 units/ml Monitor platelets by anticoagulation protocol: Yes   Plan:  Increase heparin  gtt to 1250 units/hr Check 6 hr HL Monitor daily HL, CBC, s/s of bleed  Elicia Lamp, PharmD, [facility] Clinical Pharmacist Pager [phone] 06/14/2015 4:47 PM

## 2015-06-14 NOTE — Progress Notes (Signed)
Nurse notified me that BP 129/86 prior to AM med administration. Will hold hydralazine for now so as not to precipitate hypotension - follow BP and re-add if needed. Dayna Dunn PA-C

## 2015-06-14 NOTE — ED Notes (Addendum)
C/o productive cough with clear phlegm, nasal congestion, sob, and L sided chest pain x 2 days.  Denies nausea and vomiting.

## 2015-06-14 NOTE — H&P (Addendum)
Cardiology Admission Note    Patient ID: Bela Demarzo MRN: [id number], DOB: [birth year] Date of Encounter: 06/14/2015, 7:45 AM Primary Physician: Minerva Ends, MD Primary Cardiologist: Dr. Caryl Comes  Chief Complaint: SOB Reason for Admission: elevated troponin, a/c combined CHF Requesting MD: Dr. Venora Maples  HPI: Ms. 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 She initially had recovery of her LV function in 2008 but in 2011 her EF decreased to 25%. LHC in 2004 showed mild irregularities in the RCA but otherwise normal cors. Last echo 01/21/14 showed mod LVH, [name] She has history of multiple admissions over the last 10 years due to medication noncompliance.  She has not taken her medications in about 2 weeks. About a week ago she noticed progressive DOE and orthopnea. She does not weigh herself regularly but feels she has gained weight. Last night she began noticing intermittent chest burning, lasting a few minutes at a time, resolving spontaneously, not brought on by anything in particular. She continues to smoke cigarettes but denies illicit drug use other than THC. Her son says she eats a lot of fried food. No fevers, chills, syncope. +Mild LEE. In the ER she has been hypertensive up to 170/118. She received 60mg  IV Lasix and has started to put out urine (not measured yet) with improvement in symptoms. Labs notable for troponin 0.63->1.17; BNP 858. Normal CBC/BMET. CXR with bibasilar interstitial prominence, likely atelectatic changes, no focal consolidation. She currently denies any acute symptoms.  Past Medical History  Diagnosis Date  . 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 Alopecia   . Nonischemic cardiomyopathy ([place])     a. iniatially presumed 2/2  peripartum in 1994 with improvement in 2008 and then worsening EF in 2011 back down to EF 25-30%. b. echo 01/21/14 showed mod LVH, [name]  . Noncompliance   . Tobacco abuse   . Fibroids   . Abnormal uterine bleeding   . Menorrhagia   . Peripartum cardiomyopathy   . DYSLIPIDEMIA   . Chronic combined systolic and diastolic CHF (congestive heart failure) ([place])   . Hypertension   . Stroke [facility]) 2011  . Sleep apnea 2015    CPAP 12/2013  . Fibroids   . Anemia   . H/O noncompliance with medical treatment, presenting hazards to health      Surgical History:  Past Surgical History  Procedure Laterality Date  . Loop recorder implant  ~ 2000  . Tibial tuberclerplasty    . Cesarean section  1992  1994  . Tubal ligation  1994     Home Meds: Prior to Admission medications   Medication Sig Start Date End Date Taking? Authorizing Provider  cyclobenzaprine (FLEXERIL) 10 MG tablet Take 1 tablet (10 mg total) by mouth at bedtime as needed for muscle spasms. Back pain 03/31/15  Yes Josalyn Funches, MD  furosemide (LASIX) 40 MG tablet Take 1 tablet (40 mg total) by mouth daily. 03/19/15  Yes Deboraha Sprang, MD  losartan (COZAAR) 50 MG tablet Take 1 tablet (50 mg total) by mouth daily. 09/12/14  Yes Josalyn Funches, MD  metoprolol succinate (TOPROL-XL) 25 MG 24 hr tablet Take 1 tablet (25 mg total) by mouth daily. 09/12/14  Yes Josalyn Funches, MD  minoxidil (ROGAINE) 2 % external solution Apply topically 2 (two) times daily. 03/31/15  Yes Boykin Nearing, MD  Potassium Chloride ER 20 MEQ TBCR TAKE 1 TABLET BY MOUTH 2 TIMES DAILY. 03/19/15  Yes Deboraha Sprang, MD  simvastatin (ZOCOR) 10 MG tablet Take 1 tablet (10 mg total) by mouth daily. 09/12/14  Yes Josalyn Funches, MD  hydrALAZINE (APRESOLINE) 25 MG tablet TAKE 1 TABLET BY MOUTH 2 TIMES DAILY. Patient not taking: Reported on 06/14/2015 03/26/15   Deboraha Sprang, MD    Allergies:  Allergies  Allergen Reactions  . Ace Inhibitors     REACTION:  Cough    Social History   Social History  . Marital Status: Single    Spouse Name: N/A  . Number of Children: 3  . Years of Education: 12   Occupational History  . unemployed    Social History Main Topics  . Smoking status: Current Every Day Smoker -- 0.10 packs/day for 30 years    Types: Cigarettes  . Smokeless tobacco: Never Used  . Alcohol Use: No  . Drug Use: No  . Sexual Activity: Yes    Birth Control/ Protection: IUD   Other Topics Concern  . Not on file   Social History Narrative   Lives at home with 51 yo twins ([place] and [place])   31 yo son lives outside the home   15 yo granddaughter      Family History  Problem Relation Age of Onset  . Other Neg Hx   . Heart disease Neg Hx   . Cancer Maternal Grandmother     uterine  . Hypertension Sister     Review of Systems:no fevers, chills, syncope.  All other systems reviewed and are otherwise negative except as noted above.  Labs:   Lab Results  Component Value Date   WBC 7.5 06/14/2015   HGB 14.6 06/14/2015   HCT 43.4 06/14/2015   MCV 84.9 06/14/2015   PLT 277 06/14/2015    Recent Labs Lab 06/09/15 1655 06/14/15 0347  NA 140 140  K 2.9* 3.6  CL 108 104  CO2 24 23  BUN <5* 12  CREATININE 0.67 0.77  CALCIUM 8.7* 9.1  PROT 6.6  --   BILITOT 0.7  --   ALKPHOS 96  --   ALT 14  --   AST 15  --   GLUCOSE 87 112*    Recent Labs  06/14/15 0403  TROPONINI 1.17*   Lab Results  Component Value Date   CHOL 125 01/29/2014   HDL 43 01/29/2014   LDLCALC 68 01/29/2014   TRIG 69 01/29/2014   Radiology/Studies:  Dg Chest 2 View  06/14/2015  CLINICAL DATA:  50 year old female with productive cough EXAM: CHEST  2 VIEW COMPARISON:  Radiograph dated 11/02/2014 FINDINGS: Two views of the chest demonstrate bibasilar interstitial prominence, likely atelectatic changes. Atypical pneumonia is less likely but not excluded. There is no focal consolidation, pleural effusion, or pneumothorax. Stable cardiac  silhouette. The osseous structures appear unremarkable. IMPRESSION: No focal consolidation. Electronically Signed   By: Anner Crete M.D.   On: 06/14/2015 03:57   Wt Readings from Last 3 Encounters:  06/09/15 197 lb (89.359 kg)  03/31/15 196 lb (88.905 kg)  03/27/15 192 lb 14.4 oz (87.499 kg)    EKG: sinus tach 102bpm, nonspecific ST-T Changes including downsloped TWI inferiorly and V4-V6  Physical Exam: Blood pressure 138/94, pulse 91, temperature 98.3 F (36.8 C), temperature source Oral, resp. rate 17, SpO2 100 %. There is no weight on file to calculate BMI. General: Well developed, well nourished AAF in  no acute distress. Head: Normocephalic, atraumatic, sclera non-icteric, no xanthomas, nares are without discharge.  Neck: JVD not elevated. No carotid bruits. Lungs: Diminished coarse BS at bases. No wheezes or rhonchi. Breathing is unlabored. Heart: RRR with S1 S2. No murmurs, rubs, or gallops appreciated. Abdomen: Soft, non-tender, non-distended with normoactive bowel sounds. No hepatomegaly. No rebound/guarding. No obvious abdominal masses. Msk:  Strength and tone appear normal for age. Extremities: No clubbing or cyanosis. Trace mild BLE edema. No LE erythema or tenderness.  Distal pedal pulses are 2+ and equal bilaterally. UE pulses equal bilaterally. Neuro: Alert and oriented X 3. No focal deficit. No facial asymmetry. Moves all extremities spontaneously. Psych:  Responds to questions appropriately with a normal affect.    Assessment and Plan   1. Elevated troponin/possible NSTEMI - troponin is somewhat higher than would be expected for simple CHF exacerbation. She has multiple risk factors including ongoing tobacco abuse, dyslipidemia, and poorly controlled HTN. Last ischemic eval appears to have been in 2004. Will admit, cycle troponins, and start heparin per pharmacy. Anticipate she may require cardiac cath on Monday. Start aspirin.  2. Acute on chronic combined CHF with  history of NICM - improved with dose of IV Lasix - now laying flat in bed and requesting to go home. May be driven by #3. Follow daily weights/I&Os and resume home regimen, including oral Lasix later today. Give 85meq KCl now and resume regular repletion. We discussed daily weights and sodium restriction. Update 2D echocardiogram. Initially her NICM was felt peripartum related, but now I suspect hypertensive heart disease. Will order dietician consult given reported poor diet.  3. Accelerated HTN in the setting of noncompliance - will resume home meds and follow BP. (Will write for her to get home Lasix later on today to space meds out, with hold parameters). Importance of med compliance reinforced but she doesn't seem to exhibit the best judgment when it comes to this.  4. Sleep apnea - noncompliant with CPAP. Will offer in-house.   5. HLD - check lipids. Continue statin.  6. Ongoing tobacco abuse - cessation advised.  Signed, Charlie Pitter PA-C 06/14/2015, 7:45 AM Pager: [phone]  I have examined the patient and reviewed assessment and plan and discussed with patient.  Agree with above as stated.  When I talk to her, she did admit that she had some mild stinging in her chest yesterday. This has resolved. She feels better and wants to go home. However, given the elevated troponin, I think it will be reasonable to set her up for cardiac cath. It is been more than 10 years since she had a catheterization. She certainly could have ACS as the cause of her symptoms at this time. We talked about dietary compliance. She will need to be compliant with medications as well.  Changes noted on her ECG which could represent ischemia. She is willing to be admitted.  Jaylin Roundy S.

## 2015-06-14 NOTE — Progress Notes (Signed)
Pt refuse NIV for tonight. 

## 2015-06-15 ENCOUNTER — Inpatient Hospital Stay (HOSPITAL_COMMUNITY): Payer: Medicaid Other

## 2015-06-15 LAB — LIPID PANEL
CHOL/HDL RATIO: 3.9 ratio
CHOLESTEROL: 135 mg/dL (ref 0–200)
HDL: 35 mg/dL — ABNORMAL LOW (ref >40)
LDL Cholesterol: 89 mg/dL (ref 0–99)
TRIGLYCERIDES: 53 mg/dL (ref <150)
VLDL: 11 mg/dL (ref 0–40)

## 2015-06-15 LAB — CBC
HCT: 43.5 % (ref 36.0–46.0)
HEMOGLOBIN: 14.6 g/dL (ref 12.0–15.0)
MCH: 28.6 pg (ref 26.0–34.0)
MCHC: 33.6 g/dL (ref 30.0–36.0)
MCV: 85.3 fL (ref 78.0–100.0)
Platelets: 234 10*3/uL (ref 150–400)
RBC: 5.1 MIL/uL (ref 3.87–5.11)
RDW: 14.1 % (ref 11.5–15.5)
WBC: 6.3 10*3/uL (ref 4.0–10.5)

## 2015-06-15 LAB — BASIC METABOLIC PANEL
ANION GAP: 8 (ref 5–15)
BUN: 10 mg/dL (ref 6–20)
CALCIUM: 8.5 mg/dL — AB (ref 8.9–10.3)
CO2: 26 mmol/L (ref 22–32)
Chloride: 105 mmol/L (ref 101–111)
Creatinine, Ser: 0.77 mg/dL (ref 0.44–1.00)
Glucose, Bld: 143 mg/dL — ABNORMAL HIGH (ref 65–99)
Potassium: 3.3 mmol/L — ABNORMAL LOW (ref 3.5–5.1)
SODIUM: 139 mmol/L (ref 135–145)

## 2015-06-15 LAB — HEPARIN LEVEL (UNFRACTIONATED)
HEPARIN UNFRACTIONATED: 0.25 [IU]/mL — AB (ref 0.30–0.70)
Heparin Unfractionated: 0.56 IU/mL (ref 0.30–0.70)
Heparin Unfractionated: 0.55 IU/mL (ref 0.30–0.70)

## 2015-06-15 MED ORDER — SODIUM CHLORIDE 0.9 % IV SOLN
INTRAVENOUS | Status: DC
  Administered 2015-06-16: 06:00:00 via INTRAVENOUS

## 2015-06-15 MED ORDER — ASPIRIN 81 MG PO CHEW
81.0000 mg | CHEWABLE_TABLET | ORAL | Status: AC
  Administered 2015-06-16: 81 mg via ORAL
  Filled 2015-06-15: qty 1

## 2015-06-15 MED ORDER — SODIUM CHLORIDE 0.9 % IJ SOLN: 3.0000 mL | INTRAMUSCULAR | Status: DC | PRN

## 2015-06-15 MED ORDER — SODIUM CHLORIDE 0.9 % IJ SOLN: 3.0000 mL | Freq: Two times a day (BID) | INTRAMUSCULAR | Status: DC

## 2015-06-15 MED ORDER — SODIUM CHLORIDE 0.9 % IV SOLN: 250.0000 mL | INTRAVENOUS | Status: DC | PRN

## 2015-06-15 NOTE — Progress Notes (Signed)
  Echocardiogram 2D Echocardiogram has been performed.  Jennette Dubin 06/15/2015, 1:49 PM

## 2015-06-15 NOTE — Progress Notes (Signed)
Rt Note: Pt refusing CPAP,stating she had a sleep study at [facility] last year and has not worn one yet and doesn't want to try one here. I told her if she changes her mind to call RT .

## 2015-06-15 NOTE — Plan of Care (Signed)
Problem: Fluid Volume: Goal: Ability to maintain a balanced intake and output will improve Outcome: Completed/Met Date Met:  06/15/15 Pt educated on fluid intake. Pt educated on fluid restrictions. Pt verbalized understanding.

## 2015-06-15 NOTE — Progress Notes (Signed)
Utilization review completed.  

## 2015-06-15 NOTE — Progress Notes (Signed)
ANTICOAGULATION CONSULT NOTE - Follow Up Consult  Pharmacy Consult for Heparin  Indication: chest pain/ACS  Allergies  Allergen Reactions  . Ace Inhibitors     REACTION: Cough    Patient Measurements: Weight: 190 lb 11.2 oz (86.501 kg)  Vital Signs: Temp: 98.3 F (36.8 C) (01/21 2315) Temp Source: Oral (01/21 2315) BP: 133/90 mmHg (01/21 2309) Pulse Rate: 88 (01/21 2309)  Labs:  Recent Labs  06/14/15 0347  06/14/15 0827 06/14/15 1539 06/14/15 2118 06/14/15 2320  HGB 14.6  --   --   --   --   --   HCT 43.4  --   --   --   --   --   PLT 277  --   --   --   --   --   LABPROT  --   --  14.0  --   --   --   INR  --   --  1.06  --   --   --   HEPARINUNFRC  --   --   --  0.15*  --  0.25*  CREATININE 0.77  --   --   --   --   --   TROPONINI  --   < > 2.49* 8.12* 9.43*  --   < > = values in this interval not displayed.  Estimated Creatinine Clearance: 96.8 mL/min (by C-G formula based on Cr of 0.77).   Assessment: HL remains sub-therapeutic despite rate increase  Goal of Therapy:  Heparin level 0.3-0.7 units/ml Monitor platelets by anticoagulation protocol: Yes   Plan:  -Increase heparin to 1450 units/hr -0800 HL  Narda Bonds 06/15/2015,12:24 AM

## 2015-06-15 NOTE — Progress Notes (Signed)
While emptying pt urine, this RN noticed urine to be bloody. 3-4 clots noticed to be in urine at this time. Pt does endorses having fibroids and having some spotting at times. Notified Dr. Wynonia Lawman of bleeding and this RN was instructed to stop heparin. Notified pharmacy of instructions to stop heparin at this time. Will continue to monitor.   Ruben Reason, RN

## 2015-06-15 NOTE — Progress Notes (Signed)
Subjective:  No recurrent chest burning.  Significant elevation of troponin up to 9 now.  Objective:  Vital Signs in the last 24 hours: BP 126/95 mmHg  Pulse 82  Temp(Src) 98.2 F (36.8 C) (Oral)  Resp 15  Wt 87.317 kg (192 lb 8 oz)  SpO2 100%  Physical Exam: Pleasant black female in no acute distress Lungs:  Clear Cardiac:  Regular rhythm, normal S1 and S2, no S3 Abdomen:  Soft, nontender, no masses Extremities:  No edema present  Intake/Output from previous day: 01/21 0701 - 01/22 0700 In: 979 [P.O.:716; I.V.:263] Out: 1301 [Urine:1300; Stool:1]  Weight Filed Weights   06/14/15 1041 06/15/15 0403  Weight: 86.501 kg (190 lb 11.2 oz) 87.317 kg (192 lb 8 oz)    Lab Results: Basic Metabolic Panel:  Recent Labs  06/14/15 0347 06/15/15 0450  NA 140 139  K 3.6 3.3*  CL 104 105  CO2 23 26  GLUCOSE 112* 143*  BUN 12 10  CREATININE 0.77 0.77   CBC:  Recent Labs  06/14/15 0347 06/15/15 0450  WBC 7.5 6.3  HGB 14.6 14.6  HCT 43.4 43.5  MCV 84.9 85.3  PLT 277 234   Cardiac Enzymes: Troponin (Point of Care Test)  Recent Labs  06/14/15 0339  TROPIPOC 0.63*   Cardiac Panel (last 3 results)  Recent Labs  06/14/15 0827 06/14/15 1539 06/14/15 2118  TROPONINI 2.49* 8.12* 9.43*    Telemetry: Sinus rhythm  Assessment/Plan:  1.  Non-STEMI 2.  History of nonischemic cardiomyopathy with normal cath in 2004 with ejection fraction one year ago of 55%  3.  Tobacco abuse advised to stop smoking  Recommendations:  Agree with plans for repeat cath tomorrow.  Talked about catheterization and rationale extensively with patient today and she is willing to stay and have this done.  Cardiac catheterization was discussed with the patient fully including risks of myocardial infarction, death, stroke, bleeding, arrhythmia, dye allergy, renal insufficiency or bleeding.  The patient understands and is willing to proceed.  Possibility of stenting as well as radial  approach also discussed with patient.   Kerry Hough  MD [facility] Cardiology  06/15/2015, 8:28 AM

## 2015-06-15 NOTE — Progress Notes (Signed)
[place] for heparin Indication: ACS / STEMI  Allergies  Allergen Reactions  . Ace Inhibitors     REACTION: Cough    Patient Measurements: TBW 89.4 kg IBW 64 kg Heparin Dosing Weight: 83 kg  Vital Signs: Temp: 98.1 F (36.7 C) (01/22 0845) Temp Source: Oral (01/22 0845) BP: 123/89 mmHg (01/22 0845) Pulse Rate: 87 (01/22 0845)  Labs:  Recent Labs  06/14/15 0347  06/14/15 0827 06/14/15 1539 06/14/15 2118 06/14/15 2320 06/15/15 0450 06/15/15 0825  HGB 14.6  --   --   --   --   --  14.6  --   HCT 43.4  --   --   --   --   --  43.5  --   PLT 277  --   --   --   --   --  234  --   LABPROT  --   --  14.0  --   --   --   --   --   INR  --   --  1.06  --   --   --   --   --   HEPARINUNFRC  --   --   --  0.15*  --  0.25*  --  0.56  CREATININE 0.77  --   --   --   --   --  0.77  --   TROPONINI  --   < > 2.49* 8.12* 9.43*  --   --   --   < > = values in this interval not displayed.  Estimated Creatinine Clearance: 97.4 mL/min (by C-G formula based on Cr of 0.77).   Medical History: Past Medical History  Diagnosis Date  . 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 Alopecia   . Nonischemic cardiomyopathy ([place])     a. iniatially presumed 2/2 peripartum in 1994 with improvement in 2008 and then worsening EF in 2011 back down to EF 25-30%. b. echo 01/21/14 showed mod LVH, [name]  . Noncompliance   . Tobacco abuse   . Fibroids   . Abnormal uterine bleeding   . Menorrhagia   . Peripartum cardiomyopathy   . DYSLIPIDEMIA   . Chronic combined systolic and diastolic CHF (congestive heart failure) ([place])   . Hypertension   . Stroke [facility]) 2011  . Sleep apnea 2015    CPAP 12/2013  . Fibroids   . Anemia   . H/O noncompliance with medical treatment, presenting hazards to health     Assessment: 50 yo F presents on 1/21 with SOB. Found to have elevated troponins. Pharmacy consulted to dose heparin. PMH includes CHF, dyslipidemia, HTN,  Stroke. No anticoag PTA.  HL now therapeutic at 0.56 after multiple rate increases. CBC wnl and stable with no reported significant s/s bleeding.   Goal of Therapy:  Heparin level 0.3-0.7 units/ml Monitor platelets by anticoagulation protocol: Yes   Plan:  Continue heparin gtt at 1450 units/hr Check 6 hr HL to confirm Monitor daily HL, CBC, s/s of bleed  Valleri Hendricksen K. Velva Harman, PharmD, BCPS, CPP Clinical Pharmacist Pager: [phone] Phone: [phone] 06/15/2015 9:51 AM

## 2015-06-15 NOTE — Progress Notes (Signed)
[place] for heparin Indication: ACS / STEMI  Allergies  Allergen Reactions  . Ace Inhibitors     REACTION: Cough    Patient Measurements: TBW 89.4 kg IBW 64 kg Heparin Dosing Weight: 83 kg  Vital Signs: Temp: 98.3 F (36.8 C) (01/22 1235) Temp Source: Oral (01/22 1235) BP: 128/92 mmHg (01/22 1235) Pulse Rate: 82 (01/22 1235)  Labs:  Recent Labs  06/14/15 0347  06/14/15 0827  06/14/15 1539 06/14/15 2118 06/14/15 2320 06/15/15 0450 06/15/15 0825 06/15/15 1421  HGB 14.6  --   --   --   --   --   --  14.6  --   --   HCT 43.4  --   --   --   --   --   --  43.5  --   --   PLT 277  --   --   --   --   --   --  234  --   --   LABPROT  --   --  14.0  --   --   --   --   --   --   --   INR  --   --  1.06  --   --   --   --   --   --   --   HEPARINUNFRC  --   --   --   < > 0.15*  --  0.25*  --  0.56 0.55  CREATININE 0.77  --   --   --   --   --   --  0.77  --   --   TROPONINI  --   < > 2.49*  --  8.12* 9.43*  --   --   --   --   < > = values in this interval not displayed.  Estimated Creatinine Clearance: 97.4 mL/min (by C-G formula based on Cr of 0.77).  Assessment: 50 yo F presents on 1/21 with SOB. Found to have elevated troponins. Pharmacy consulted to dose heparin. PMH includes CHF, dyslipidemia, HTN, Stroke. No anticoag PTA.  HL remains therapeutic at 0.55. CBC wnl and stable with no reported significant s/s bleeding.   Goal of Therapy:  Heparin level 0.3-0.7 units/ml Monitor platelets by anticoagulation protocol: Yes   Plan:  Continue heparin gtt at 1450 units/hr Monitor daily HL, CBC, s/s of bleed  Salome Arnt, PharmD, BCPS Pager # [phone] 06/15/2015 3:35 PM

## 2015-06-16 ENCOUNTER — Encounter (HOSPITAL_COMMUNITY)
Admission: EM | Disposition: A | Discharge status: Home / Self Care | Payer: Medicaid Other | Source: Home / Self Care | Attending: Interventional Cardiology

## 2015-06-16 ENCOUNTER — Encounter (HOSPITAL_COMMUNITY): Payer: Self-pay | Admitting: Interventional Cardiology

## 2015-06-16 DIAGNOSIS — I5043 Acute on chronic combined systolic (congestive) and diastolic (congestive) heart failure: Secondary | ICD-10-CM

## 2015-06-16 DIAGNOSIS — I214 Non-ST elevation (NSTEMI) myocardial infarction: Principal | ICD-10-CM

## 2015-06-16 DIAGNOSIS — E785 Hyperlipidemia, unspecified: Secondary | ICD-10-CM

## 2015-06-16 DIAGNOSIS — R7989 Other specified abnormal findings of blood chemistry: Secondary | ICD-10-CM

## 2015-06-16 DIAGNOSIS — G4733 Obstructive sleep apnea (adult) (pediatric): Secondary | ICD-10-CM

## 2015-06-16 DIAGNOSIS — I5021 Acute systolic (congestive) heart failure: Secondary | ICD-10-CM | POA: Insufficient documentation

## 2015-06-16 DIAGNOSIS — R778 Other specified abnormalities of plasma proteins: Secondary | ICD-10-CM | POA: Insufficient documentation

## 2015-06-16 HISTORY — PX: CARDIAC CATHETERIZATION: SHX172

## 2015-06-16 LAB — BASIC METABOLIC PANEL
Anion gap: 11 (ref 5–15)
BUN: 9 mg/dL (ref 6–20)
CALCIUM: 8.8 mg/dL — AB (ref 8.9–10.3)
CHLORIDE: 105 mmol/L (ref 101–111)
CO2: 23 mmol/L (ref 22–32)
CREATININE: 0.75 mg/dL (ref 0.44–1.00)
GFR calc Af Amer: >60 mL/min (ref >60)
GFR calc non Af Amer: >60 mL/min (ref >60)
GLUCOSE: 97 mg/dL (ref 65–99)
Potassium: 3.9 mmol/L (ref 3.5–5.1)
Sodium: 139 mmol/L (ref 135–145)

## 2015-06-16 LAB — CBC
HEMATOCRIT: 43.4 % (ref 36.0–46.0)
Hemoglobin: 14.6 g/dL (ref 12.0–15.0)
MCH: 28.7 pg (ref 26.0–34.0)
MCHC: 33.6 g/dL (ref 30.0–36.0)
MCV: 85.4 fL (ref 78.0–100.0)
PLATELETS: 241 10*3/uL (ref 150–400)
RBC: 5.08 MIL/uL (ref 3.87–5.11)
RDW: 14.3 % (ref 11.5–15.5)
WBC: 6.1 10*3/uL (ref 4.0–10.5)

## 2015-06-16 LAB — HEMOGLOBIN A1C
Hgb A1c MFr Bld: 5.8 % — ABNORMAL HIGH (ref 4.8–5.6)
MEAN PLASMA GLUCOSE: 120 mg/dL

## 2015-06-16 LAB — HEPARIN LEVEL (UNFRACTIONATED): Heparin Unfractionated: <0.1 IU/mL — ABNORMAL LOW (ref 0.30–0.70)

## 2015-06-16 SURGERY — LEFT HEART CATH AND CORONARY ANGIOGRAPHY
Anesthesia: LOCAL

## 2015-06-16 MED ORDER — SODIUM CHLORIDE 0.9 % IV SOLN: 250.0000 mL | INTRAVENOUS | Status: DC | PRN

## 2015-06-16 MED ORDER — FENTANYL CITRATE (PF) 100 MCG/2ML IJ SOLN
INTRAMUSCULAR | Status: DC | PRN
  Administered 2015-06-16 (×2): 25 ug via INTRAVENOUS

## 2015-06-16 MED ORDER — LIDOCAINE HCL (PF) 1 % IJ SOLN
INTRAMUSCULAR | Status: DC | PRN
  Administered 2015-06-16: 2 mL via INTRADERMAL

## 2015-06-16 MED ORDER — LIVING BETTER WITH HEART FAILURE BOOK: Freq: Once | Status: DC

## 2015-06-16 MED ORDER — HEPARIN SODIUM (PORCINE) 1000 UNIT/ML IJ SOLN
INTRAMUSCULAR | Status: AC
  Filled 2015-06-16: qty 1

## 2015-06-16 MED ORDER — FUROSEMIDE 40 MG PO TABS
40.0000 mg | ORAL_TABLET | Freq: Two times a day (BID) | ORAL | Status: DC
  Administered 2015-06-17: 40 mg via ORAL
  Filled 2015-06-16: qty 1

## 2015-06-16 MED ORDER — IOHEXOL 350 MG/ML SOLN
INTRAVENOUS | Status: DC | PRN
Start: 2015-06-16 — End: 2015-06-16
  Administered 2015-06-16: 100 mL via INTRACARDIAC

## 2015-06-16 MED ORDER — MIDAZOLAM HCL 2 MG/2ML IJ SOLN
INTRAMUSCULAR | Status: AC
  Filled 2015-06-16: qty 2

## 2015-06-16 MED ORDER — LIDOCAINE HCL (PF) 1 % IJ SOLN
INTRAMUSCULAR | Status: AC
  Filled 2015-06-16: qty 30

## 2015-06-16 MED ORDER — METOPROLOL SUCCINATE ER 50 MG PO TB24
50.0000 mg | ORAL_TABLET | Freq: Every day | ORAL | Status: DC
  Administered 2015-06-17: 50 mg via ORAL
  Filled 2015-06-16: qty 1

## 2015-06-16 MED ORDER — FENTANYL CITRATE (PF) 100 MCG/2ML IJ SOLN
INTRAMUSCULAR | Status: AC
  Filled 2015-06-16: qty 2

## 2015-06-16 MED ORDER — METOPROLOL TARTRATE 1 MG/ML IV SOLN
INTRAVENOUS | Status: AC
  Filled 2015-06-16: qty 5

## 2015-06-16 MED ORDER — METOPROLOL TARTRATE 1 MG/ML IV SOLN
INTRAVENOUS | Status: DC | PRN
  Administered 2015-06-16: 5 mg via INTRAVENOUS

## 2015-06-16 MED ORDER — MIDAZOLAM HCL 2 MG/2ML IJ SOLN
INTRAMUSCULAR | Status: DC | PRN
  Administered 2015-06-16: 2 mg via INTRAVENOUS
  Administered 2015-06-16: 1 mg via INTRAVENOUS

## 2015-06-16 MED ORDER — HEPARIN (PORCINE) IN NACL 2-0.9 UNIT/ML-% IJ SOLN
INTRAMUSCULAR | Status: AC
  Filled 2015-06-16: qty 1500

## 2015-06-16 MED ORDER — SODIUM CHLORIDE 0.9 % IV SOLN
INTRAVENOUS | Status: AC
  Administered 2015-06-16: 75 mL/h via INTRAVENOUS

## 2015-06-16 MED ORDER — SODIUM CHLORIDE 0.9 % IJ SOLN: 3.0000 mL | Freq: Two times a day (BID) | INTRAMUSCULAR | Status: DC

## 2015-06-16 MED ORDER — SODIUM CHLORIDE 0.9 % IJ SOLN: 3.0000 mL | INTRAMUSCULAR | Status: DC | PRN

## 2015-06-16 MED ORDER — ACETAMINOPHEN 325 MG PO TABS: 650.0000 mg | ORAL_TABLET | ORAL | Status: DC | PRN

## 2015-06-16 MED ORDER — HEPARIN SODIUM (PORCINE) 5000 UNIT/ML IJ SOLN
5000.0000 [IU] | Freq: Three times a day (TID) | INTRAMUSCULAR | Status: DC
  Filled 2015-06-16: qty 1

## 2015-06-16 MED ORDER — VERAPAMIL HCL 2.5 MG/ML IV SOLN
INTRAVENOUS | Status: AC
  Filled 2015-06-16: qty 2

## 2015-06-16 MED ORDER — HEPARIN SODIUM (PORCINE) 1000 UNIT/ML IJ SOLN
INTRAMUSCULAR | Status: DC | PRN
  Administered 2015-06-16: 4500 [IU] via INTRAVENOUS

## 2015-06-16 MED ORDER — ONDANSETRON HCL 4 MG/2ML IJ SOLN: 4.0000 mg | Freq: Four times a day (QID) | INTRAMUSCULAR | Status: DC | PRN

## 2015-06-16 MED ORDER — HEPARIN (PORCINE) IN NACL 2-0.9 UNIT/ML-% IJ SOLN
INTRAMUSCULAR | Status: DC | PRN
  Administered 2015-06-16: 10 mL via INTRA_ARTERIAL

## 2015-06-16 SURGICAL SUPPLY — 12 items
CATH INFINITI 5 FR JL3.5 (CATHETERS) ×2 IMPLANT
CATH INFINITI 5FR ANG PIGTAIL (CATHETERS) ×2 IMPLANT
CATH INFINITI JR4 5F (CATHETERS) ×2 IMPLANT
DEVICE RAD COMP TR BAND LRG (VASCULAR PRODUCTS) ×2 IMPLANT
GLIDESHEATH SLEND SS 6F .021 (SHEATH) ×2 IMPLANT
KIT HEART LEFT (KITS) ×2 IMPLANT
PACK CARDIAC CATHETERIZATION (CUSTOM PROCEDURE TRAY) ×2 IMPLANT
SYR MEDRAD MARK V 150ML (SYRINGE) ×2 IMPLANT
TRANSDUCER W/STOPCOCK (MISCELLANEOUS) ×2 IMPLANT
TUBING CIL FLEX 10 FLL-RA (TUBING) ×2 IMPLANT
WIRE HI TORQ VERSACORE-J 145CM (WIRE) ×2 IMPLANT
WIRE SAFE-T 1.5MM-J .035X260CM (WIRE) ×4 IMPLANT

## 2015-06-16 NOTE — Progress Notes (Signed)
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 She initially had recovery of her LV function in 2008 but in 2011 her EF decreased to 25%. LHC in 2004 showed mild irregularities in the RCA but otherwise normal cors. Last echo 01/21/14 showed mod LVH, [name] She has history of multiple admissions over the last 10 years due to medication noncompliance.  She has not taken her medications in about 2 weeks. About a week ago she noticed progressive DOE and orthopnea. She does not weigh herself regularly but feels she has gained weight. PM 1/20 -- she began noticing intermittent chest burning, lasting a few minutes at a time, resolving spontaneously, not brought on by anything in particular. She has ruled in for NSEMI with troponin of ~8.  Subjective:  No further CP  Heparin stopped yesterday 2/2 hematuria NO Orthopnea or PND  Objective:  Vital Signs in the last 24 hours: Temp:  [97.8 F (36.6 C)-99 F (37.2 C)] 98.2 F (36.8 C) (01/23 0802) Pulse Rate:  [81-89] 84 (01/23 0802) Resp:  [14-18] 16 (01/23 0802) BP: (119-139)/(86-97) 138/97 mmHg (01/23 0802) SpO2:  [96 %-100 %] 96 % (01/23 0802) Weight:  [192 lb 1.6 oz (87.136 kg)-192 lb 8 oz (87.317 kg)] 192 lb 1.6 oz (87.136 kg) (01/23 0612)  Intake/Output from previous day: 01/22 0701 - 01/23 0700 In: 999.5 [P.O.:840; I.V.:159.5] Out: 1775 [Urine:1775] Intake/Output from this shift:    Physical Exam: General appearance: alert, cooperative and no distress Neck: no adenopathy, no carotid bruit and no JVD Lungs: clear to auscultation bilaterally and normal percussion bilaterally Heart: regular rate and rhythm, S1, S2 normal, no murmur, click, rub or gallop Abdomen: soft, non-tender; bowel sounds normal; no masses,  no  organomegaly Extremities: extremities normal, atraumatic, no cyanosis or edema Pulses: 2+ and symmetric Neurologic: Grossly normal  Lab Results:  Recent Labs  06/15/15 0450 06/16/15 0535  WBC 6.3 6.1  HGB 14.6 14.6  PLT 234 241    Recent Labs  06/15/15 0450 06/16/15 0535  NA 139 139  K 3.3* 3.9  CL 105 105  CO2 26 23  GLUCOSE 143* 97  BUN 10 9  CREATININE 0.77 0.75    Recent Labs  06/14/15 1539 06/14/15 2118  TROPONINI 8.12* 9.43*   Hepatic Function Panel  Recent Labs  06/14/15 0827  PROT 7.3  ALBUMIN 3.7  AST 34  ALT 27  ALKPHOS 109  BILITOT 1.1  BILIDIR 0.2  IBILI 0.9    Recent Labs  06/15/15 0450  CHOL 135   No results for input(s): PROTIME in the last 72 hours.  Imaging: Imaging results have been reviewed  Cardiac Studies:  - Left ventricle: The cavity size was mildly dilated. There was moderate concentric hypertrophy. Systolic function was severely reduced. The estimated ejection fraction was in the range of 25% to 30%. Severe diffuse hypokinesis. Although no diagnostic regional wall motion abnormality was identified, this possibility cannot be completely excluded on the basis of this study. - Mitral valve: There was moderate regurgitation directed toward the free wall. - Left atrium: The atrium was moderately dilated.  Assessment/Plan:  Active Problems:   Chronic combined systolic and diastolic CHF (congestive heart failure) (H[facility])   Acute on chronic combined systolic (congestive) and diastolic (congestive) heart failure (HCC)   Accelerated hypertension   HLD (hyperlipidemia)   OSA (obstructive  sleep apnea)   NICM (nonischemic cardiomyopathy) ([place])   H/O noncompliance with medical treatment, presenting hazards to health   NSTEMI (non-ST elevated myocardial infarction) ([place])  Pain free today - Plan for LHC- Angio+/- PCI today -- R/B/A/I & procedure explained & ? Answered.  - On ASA, BB & ARB.  On Simvastatin  -  Heparin currently on hold 2/2 hematuria (OK given no further CP & plan cath today).  Accelerated HTN (in part due to med non-compliance) BP stable - now that she is back on her medications. -- BB, ARB Pending Cath results - (i.e. If PCI indicated)- convert to high dose Atorvastatin pre-d/c   NO Active CHF Sx. - EF 25-30% by Echo yesterday (new from 2015) - suspect medical non-adherence is a factor - LHC today to evaluate for Ischemic Etiology. - is on ARB, & BB (Toprol) --> if she proves to be compliant, could consider Entresto. - currently no Lasix requirement - monitor.  May use standing PO dose for d/c  OSA -- CPAP non-compliant.  Need to address as OP  Tobacco cessation provided.     LOS: 2 days     HARDING, DAVID W 06/16/2015, 9:36 AM

## 2015-06-16 NOTE — Progress Notes (Signed)
RT NOTE:  Pt refuses CPAP at this time. She has not worn one before. Sleep study in past but nothing every came from that. Pt understands to call if she changes her mind

## 2015-06-16 NOTE — Interval H&P Note (Signed)
Cath Lab Visit (complete for each Cath Lab visit)  Clinical Evaluation Leading to the Procedure:   ACS: Yes.    Non-ACS:    Anginal Classification: CCS IV  Anti-ischemic medical therapy: Minimal Therapy (1 class of medications)  Non-Invasive Test Results: No non-invasive testing performed  Prior CABG: No previous CABG      History and Physical Interval Note:  06/16/2015 11:25 AM  Chelsea Branch  has presented today for surgery, with the diagnosis of NSTEMI  The various methods of treatment have been discussed with the patient and family. After consideration of risks, benefits and other options for treatment, the patient has consented to  Procedure(s): Left Heart Cath and Coronary Angiography (N/A) as a surgical intervention .  The patient's history has been reviewed, patient examined, no change in status, stable for surgery.  I have reviewed the patient's chart and labs.  Questions were answered to the patient's satisfaction.     Chelsea Branch S.

## 2015-06-16 NOTE — Care Management Note (Addendum)
Case Management Note  Patient Details  Name: Chelsea Branch MRN: [id number] Date of Birth: 1966/04/18  Subjective/Objective:  Pt admitted for Nstemi. Plan for cardiac cath 06-16-15.                   Action/Plan: CM did receive referral for HF Navigator to speak with pt in regards to education. CM did relay message. CM will continue to monitor for disposition needs.    Expected Discharge Date:                  Expected Discharge Plan:  [place]  In-House Referral:  NA  Discharge planning Services  CM Consult  Post Acute Care Choice:    Choice offered to:     DME Arranged:   N/A DME Agency:   N/A  HH Arranged:   N/A HH Agency:   N/A  Status of Service:  Completed.  Medicare Important Message Given:    Date Medicare IM Given:    Medicare IM give by:    Date Additional Medicare IM Given:    Additional Medicare Important Message give by:     If discussed at [place] of Stay Meetings, dates discussed:    Additional Comments: [phone] 06-17-15 Jacqlyn Krauss, RN,BSN [phone] CM did speak with HF Navigator and the plan is to do HF teaching and provide pt with a scale. 1115 06-17-15 CM did speak with pt in regards to [place] and pt is working at [employer]- not homebound and she has twin boys age 19. Pt had some questions in regards to housing. CM did call Transitional Care Manager Arbutus Leas with [facility] to see if they can f/u outpatient for additional housing resources. No further needs from CM at this time.   Bethena Roys, RN 06/16/2015, 2:46 PM

## 2015-06-16 NOTE — H&P (View-Only) (Signed)
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 She initially had recovery of her LV function in 2008 but in 2011 her EF decreased to 25%. LHC in 2004 showed mild irregularities in the RCA but otherwise normal cors. Last echo 01/21/14 showed mod LVH, [name] She has history of multiple admissions over the last 10 years due to medication noncompliance.  She has not taken her medications in about 2 weeks. About a week ago she noticed progressive DOE and orthopnea. She does not weigh herself regularly but feels she has gained weight. PM 1/20 -- she began noticing intermittent chest burning, lasting a few minutes at a time, resolving spontaneously, not brought on by anything in particular. She has ruled in for NSEMI with troponin of ~8.  Subjective:  No further CP  Heparin stopped yesterday 2/2 hematuria NO Orthopnea or PND  Objective:  Vital Signs in the last 24 hours: Temp:  [97.8 F (36.6 C)-99 F (37.2 C)] 98.2 F (36.8 C) (01/23 0802) Pulse Rate:  [81-89] 84 (01/23 0802) Resp:  [14-18] 16 (01/23 0802) BP: (119-139)/(86-97) 138/97 mmHg (01/23 0802) SpO2:  [96 %-100 %] 96 % (01/23 0802) Weight:  [192 lb 1.6 oz (87.136 kg)-192 lb 8 oz (87.317 kg)] 192 lb 1.6 oz (87.136 kg) (01/23 0612)  Intake/Output from previous day: 01/22 0701 - 01/23 0700 In: 999.5 [P.O.:840; I.V.:159.5] Out: 1775 [Urine:1775] Intake/Output from this shift:    Physical Exam: General appearance: alert, cooperative and no distress Neck: no adenopathy, no carotid bruit and no JVD Lungs: clear to auscultation bilaterally and normal percussion bilaterally Heart: regular rate and rhythm, S1, S2 normal, no murmur, click, rub or gallop Abdomen: soft, non-tender; bowel sounds normal; no masses,  no  organomegaly Extremities: extremities normal, atraumatic, no cyanosis or edema Pulses: 2+ and symmetric Neurologic: Grossly normal  Lab Results:  Recent Labs  06/15/15 0450 06/16/15 0535  WBC 6.3 6.1  HGB 14.6 14.6  PLT 234 241    Recent Labs  06/15/15 0450 06/16/15 0535  NA 139 139  K 3.3* 3.9  CL 105 105  CO2 26 23  GLUCOSE 143* 97  BUN 10 9  CREATININE 0.77 0.75    Recent Labs  06/14/15 1539 06/14/15 2118  TROPONINI 8.12* 9.43*   Hepatic Function Panel  Recent Labs  06/14/15 0827  PROT 7.3  ALBUMIN 3.7  AST 34  ALT 27  ALKPHOS 109  BILITOT 1.1  BILIDIR 0.2  IBILI 0.9    Recent Labs  06/15/15 0450  CHOL 135   No results for input(s): PROTIME in the last 72 hours.  Imaging: Imaging results have been reviewed  Cardiac Studies:  - Left ventricle: The cavity size was mildly dilated. There was moderate concentric hypertrophy. Systolic function was severely reduced. The estimated ejection fraction was in the range of 25% to 30%. Severe diffuse hypokinesis. Although no diagnostic regional wall motion abnormality was identified, this possibility cannot be completely excluded on the basis of this study. - Mitral valve: There was moderate regurgitation directed toward the free wall. - Left atrium: The atrium was moderately dilated.  Assessment/Plan:  Active Problems:   Chronic combined systolic and diastolic CHF (congestive heart failure) (HCC)   Acute on chronic combined systolic (congestive) and diastolic (congestive) heart failure (HCC)   Accelerated hypertension   HLD (hyperlipidemia)   OSA (obstructive  sleep apnea)   NICM (nonischemic cardiomyopathy) ([place])   H/O noncompliance with medical treatment, presenting hazards to health   NSTEMI (non-ST elevated myocardial infarction) ([place])  Pain free today - Plan for LHC- Angio+/- PCI today -- R/B/A/I & procedure explained & ? Answered.  - On ASA, BB & ARB.  On Simvastatin  -  Heparin currently on hold 2/2 hematuria (OK given no further CP & plan cath today).  Accelerated HTN (in part due to med non-compliance) BP stable - now that she is back on her medications. -- BB, ARB Pending Cath results - (i.e. If PCI indicated)- convert to high dose Atorvastatin pre-d/c   NO Active CHF Sx. - EF 25-30% by Echo yesterday (new from 2015) - suspect medical non-adherence is a factor - LHC today to evaluate for Ischemic Etiology. - is on ARB, & BB (Toprol) --> if she proves to be compliant, could consider Entresto. - currently no Lasix requirement - monitor.  May use standing PO dose for d/c  OSA -- CPAP non-compliant.  Need to address as OP  Tobacco cessation provided.     LOS: 2 days     HARDING, DAVID W 06/16/2015, 9:36 AM

## 2015-06-16 NOTE — Progress Notes (Signed)
TR band removed at 15:05. No bleeding or hematoma present. VSS. Pt is pain free at this time. Will continue to monitor.    Ruben Reason, RN

## 2015-06-17 ENCOUNTER — Telehealth: Payer: Self-pay | Admitting: Nurse Practitioner

## 2015-06-17 ENCOUNTER — Encounter (HOSPITAL_COMMUNITY): Payer: Self-pay | Admitting: Cardiology

## 2015-06-17 DIAGNOSIS — I420 Dilated cardiomyopathy: Secondary | ICD-10-CM

## 2015-06-17 DIAGNOSIS — I5042 Chronic combined systolic (congestive) and diastolic (congestive) heart failure: Secondary | ICD-10-CM

## 2015-06-17 HISTORY — DX: Dilated cardiomyopathy: I42.0

## 2015-06-17 LAB — CBC
HCT: 41.8 % (ref 36.0–46.0)
HEMOGLOBIN: 13.6 g/dL (ref 12.0–15.0)
MCH: 27.9 pg (ref 26.0–34.0)
MCHC: 32.5 g/dL (ref 30.0–36.0)
MCV: 85.7 fL (ref 78.0–100.0)
Platelets: 259 10*3/uL (ref 150–400)
RBC: 4.88 MIL/uL (ref 3.87–5.11)
RDW: 14.3 % (ref 11.5–15.5)
WBC: 5.4 10*3/uL (ref 4.0–10.5)

## 2015-06-17 MED ORDER — POTASSIUM CHLORIDE ER 20 MEQ PO TBCR: 1.0000 | EXTENDED_RELEASE_TABLET | Freq: Every day | ORAL | Status: DC

## 2015-06-17 MED ORDER — FUROSEMIDE 40 MG PO TABS: 40.0000 mg | ORAL_TABLET | Freq: Every day | ORAL | Status: DC

## 2015-06-17 MED ORDER — METOPROLOL SUCCINATE ER 50 MG PO TB24: 50.0000 mg | ORAL_TABLET | Freq: Every day | ORAL | Status: DC

## 2015-06-17 MED FILL — Heparin Sodium (Porcine) 2 Unit/ML in Sodium Chloride 0.9%: INTRAMUSCULAR | Qty: 1000 | Status: AC

## 2015-06-17 NOTE — Progress Notes (Signed)
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 She initially had recovery of her LV function in 2008 but in 2011 her EF decreased to 25%. LHC in 2004 showed mild irregularities in the RCA but otherwise normal cors. Last echo 01/21/14 showed mod LVH, [name] She has history of multiple admissions over the last 10 years due to medication noncompliance.  She has not taken her medications in about 2 weeks. About a week ago she noticed progressive DOE and orthopnea. She does not weigh herself regularly but feels she has gained weight. PM 1/20 -- she began noticing intermittent chest burning, lasting a few minutes at a time, resolving spontaneously, not brought on by anything in particular. She has ruled in for NSEMI with troponin of ~8.  Subjective:  Chest pain. Breathing well, but still required some slight head elevation sleep. No dyspnea at rest. No dyspnea with walking  Objective:  Vital Signs in the last 24 hours: Temp:  [97.7 F (36.5 C)-98.4 F (36.9 C)] 97.7 F (36.5 C) (01/24 0438) Pulse Rate:  [0-141] 80 (01/24 0438) Resp:  [0-28] 18 (01/24 0438) BP: (128-163)/(78-116) 154/98 mmHg (01/24 0438) SpO2:  [0 %-100 %] 99 % (01/24 0438) Weight:  [192 lb 3.2 oz (87.181 kg)] 192 lb 3.2 oz (87.181 kg) (01/24 0438)  Intake/Output from previous day: 01/23 0701 - 01/24 0700 In: 740 [P.O.:720; I.V.:20] Out: 2000 [Urine:2000] Intake/Output from this shift:    Physical Exam: General appearance: alert, cooperative and no distress Neck: no adenopathy, no carotid bruit and no JVD Lungs: CTA B and normal percussion bilaterally Heart: regular rate and rhythm, S1& S2 normal, no murmur, click, rub - possible soft S4 gallop Abdomen: soft, non-tender; bowel sounds normal; no masses,   no organomegaly Extremities: extremities normal, atraumatic, no cyanosis or edema Pulses: 2+ and symmetric Neurologic: Grossly normal  Lab Results:  Recent Labs  06/16/15 0535 06/17/15 0251  WBC 6.1 5.4  HGB 14.6 13.6  PLT 241 259    Recent Labs  06/15/15 0450 06/16/15 0535  NA 139 139  K 3.3* 3.9  CL 105 105  CO2 26 23  GLUCOSE 143* 97  BUN 10 9  CREATININE 0.77 0.75    Recent Labs  06/14/15 1539 06/14/15 2118  TROPONINI 8.12* 9.43*   Hepatic Function Panel No results for input(s): PROT, ALBUMIN, AST, ALT, ALKPHOS, BILITOT, BILIDIR, IBILI in the last 72 hours.  Recent Labs  06/15/15 0450  CHOL 135   No results for input(s): PROTIME in the last 72 hours.  Imaging: Imaging results have been reviewed  Cardiac Studies:  Echo 1/22 - Left ventricle: The cavity size was mildly dilated. There was moderate concentric hypertrophy. Systolic function was severely reduced. The estimated ejection fraction was in the range of 25% to 30%. Severe diffuse hypokinesis. Although no diagnostic regional wall motion abnormality was identified, this possibility cannot be completely excluded on the basis of this study. - Mitral valve: There was moderate regurgitation directed toward the free wall. - Left atrium: The atrium was moderately dilated.   Cath 1/23 Conclusion     There is severe left ventricular systolic dysfunction.  No significant CAD.  LVEDP elevated.  Nonischemic cardiomyopathy. Continue medical therapy.   [name]. Consider diuresis.    Assessment/Plan:  Principal Problem:   Acute on chronic combined systolic (congestive) and diastolic (congestive)  heart failure (HCC) Active Problems:   Chronic combined systolic and diastolic CHF (congestive heart failure) (HCC)   NSTEMI (non-ST elevated myocardial infarction) (HCC) - with normal cornaries on Cath   Accelerated hypertension   Nonischemic dilated cardiomyopathy (HCC)   HLD (hyperlipidemia)    Essential hypertension   OSA (obstructive sleep apnea)   H/O noncompliance with medical treatment, presenting hazards to health  CATH results reviewed - no significant coronary disease, but severely elevated LVEDP. She was kept overnight last night to adjust medications. Lasix was added with appropriate response and diuresis.     Acute on chronic combined systolic (congestive) and diastolic (congestive) heart failure (HCC) Likely recurrence of nonischemic cardiomyopathy, possibly due to noncompliance.  She is already on Toprol, have increased to 50 mg daily. She is on losartan 50 mg, will continue the current dose.  Added furosemide - would discharge on 40 mg 1-2 tabs daily as directed. This would mean standing dose of one time daily with an additional dose when necessary weight gain >3 pounds in a day. She will continue taking additional dose until her weight is back to her baseline.  Discussed sliding scale Lasix - daily weights (would weigh herself upon arrival home -- we will try to help her obtain home scale) - for weight gain greater than 3 pounds, she will take an additional dose of furosemide every day until her weight goes back down to the baseline.  NSTEMI (non-ST elevated myocardial infarction) ([place]) - with normal cornaries on Cath - likely related to accelerated hypertension with hypertensive heart disease.- On ASA, BB & ARB.  On Simvastatin   Accelerated HTN (in part due to med non-compliance) BP stable - now that she is back on her medications. -- BB, ARB - will need to see how her blood pressure will normalize on current regimen, can be titrated as an outpatient.   NO Active CHF Sx. - EF 25-30% by Echo yesterday (new from 2015) - suspect medical non-adherence is a factor - LHC confirm nonischemic, with [name]. Initiated Lasix based on elevated EDP - is on ARB, & BB (Toprol) --> if she proves to be compliant, could consider [name] has outpatient.   OSA -- CPAP  non-compliant.  Need to address as OP  Tobacco cessation counseling provided.  She does voice interest in stopping, we discussed potentially using nicotine patches. She would prefer not to try to take medications to assist with this, but would be okay with patches.   Patient is feeling well, and is stable for discharge.  Has had CHF education. Will need TCM f/u & f/u with Dr. Caryl Comes.    LOS: 3 days     Reve Crocket W 06/17/2015, 9:17 AM

## 2015-06-17 NOTE — Discharge Summary (Signed)
CARDIOLOGY DISCHARGE SUMMARY   [name] MRN: [id number] DOB/AGE: 1965-11-25 50 y.o.  Admit date: 06/14/2015 Discharge date: 06/17/2015  PCP: Minerva Ends, MD Primary Cardiologist: Dr Caryl Comes  Primary Discharge Diagnosis:   Acute on chronic combined systolic (congestive) and diastolic (congestive) heart failure ([place])- weight at d/c 192 lbs  Secondary Discharge Diagnosis:    HLD (hyperlipidemia)   Essential hypertension   Accelerated hypertension   Chronic combined systolic and diastolic CHF (congestive heart failure) (HCC)   OSA (obstructive sleep apnea)   H/O noncompliance with medical treatment, presenting hazards to health   NSTEMI (non-ST elevated myocardial infarction) ([place]) - with normal cornaries on Cath   Accelerated hypertension   Nonischemic dilated cardiomyopathy ([place])  Procedures: Left Heart Cath and Coronary Angiography, 2D echocardiogram  Hospital Course: Chelsea Branch is a 50 y.o. 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 Last EF was 50-55% in 2015.  Came to hospital on 01/21 w/ increasing SOB, DOE, orthopnea and chest burning. She was admitted for further evaluation and treatment.   She was diuresed with IV Lasix and her respiratory status improved. She lost 2.3 L during her stay. By discharge, she did not require supplemental oxygen and was maintaining O2 sats on room air. She will get a set of scales from the hospital to weigh at home.  Her BP was very high on admission, 163/113. She had not been taking medication. She was restarted on BP medication and her BP improved.   Her cardiac enzymes were significantly elevated, NSTEMI. An echo was performed, it showed an EF of 25-30%, diffuse hypokinesis. Cardiac cath was performed on 01/23, results below. No CAD, but it  confirmed the low EF, likely related to accelerated hypertension with hypertensive heart disease. On ASA, BB & ARB plus Simvastatin.   Tobacco cessation counseling provided. She does voice interest in stopping, potentially using nicotine patches. She would prefer not to try to take medications to assist with this, but would be okay with patches so these were ordered.  She has OSA, non-compliant with CPAP, address as outpatient.  On 01/23, she was seen by Dr Ellyn Hack and all data were reviewed. She is to take Lasix, daily dose at first and then additional dose based on weights. She will get a TCM f/u appt with labs next week. Her medications were simplified slightly, she is off hydralazine, but on a higher dose of Toprol XL and also able to take extra Lasix PRN.   Labs:   Lab Results  Component Value Date   WBC 5.4 06/17/2015   HGB 13.6 06/17/2015   HCT 41.8 06/17/2015   MCV 85.7 06/17/2015   PLT 259 06/17/2015    Recent Labs Lab 06/14/15 0827  06/16/15 0535  NA  --   < > 139  K  --   < > 3.9  CL  --   < > 105  CO2  --   < > 23  BUN  --   < > 9  CREATININE  --   < > 0.75  CALCIUM  --   < > 8.8*  PROT 7.3  --   --   BILITOT 1.1  --   --   ALKPHOS 109  --   --   ALT 27  --   --   AST 34  --   --  GLUCOSE  --   < > 97  < > = values in this interval not displayed.  Recent Labs  06/14/15 1539 06/14/15 2118  TROPONINI 8.12* 9.43*   Lipid Panel     Component Value Date/Time   CHOL 135 06/15/2015 0450   TRIG 53 06/15/2015 0450   HDL 35* 06/15/2015 0450   CHOLHDL 3.9 06/15/2015 0450   VLDL 11 06/15/2015 0450   LDLCALC 89 06/15/2015 0450    B NATRIURETIC PEPTIDE  Date/Time Value Ref Range Status  06/14/2015 05:00 AM 858.2* 0.0 - 100.0 pg/mL Final      Radiology: Dg Chest 2 View 06/14/2015  CLINICAL DATA:  50 year old female with productive cough EXAM: CHEST  2 VIEW COMPARISON:  Radiograph dated 11/02/2014 FINDINGS: Two views of the chest demonstrate bibasilar  interstitial prominence, likely atelectatic changes. Atypical pneumonia is less likely but not excluded. There is no focal consolidation, pleural effusion, or pneumothorax. Stable cardiac silhouette. The osseous structures appear unremarkable. IMPRESSION: No focal consolidation. Electronically Signed   By: Anner Crete M.D.   On: 06/14/2015 03:57    Cardiac Cath: 06/16/2015  There is severe left ventricular systolic dysfunction. EF 25% by visual estimate  No significant CAD.  LVEDP elevated.  Left Ventricular Apex Extended Diastolic Pressure  16 mmHg   Left Ventricular Apex Extended EDP Pressure  30 mmHg    Nonischemic cardiomyopathy. Continue medical therapy.  [name]. Consider diuresis.   EKG: 06/15/2015 SR, LVH w/ early repol Vent. rate 87 BPM PR interval 160 ms QRS duration 92 ms QT/QTc 400/481 ms P-R-T axes 70 60 263  Echo: 06/15/2015 - Left ventricle: The cavity size was mildly dilated. There was moderate concentric hypertrophy. Systolic function was severely reduced. The estimated ejection fraction was in the range of 25% to 30%. Severe diffuse hypokinesis. Although no diagnostic regional wall motion abnormality was identified, this possibility cannot be completely excluded on the basis of this study. - Mitral valve: There was moderate regurgitation directed toward the free wall. - Left atrium: The atrium was moderately dilated.  FOLLOW UP PLANS AND APPOINTMENTS Allergies  Allergen Reactions  . Ace Inhibitors     REACTION: Cough     Medication List    STOP taking these medications        hydrALAZINE 25 MG tablet  Commonly known as:  APRESOLINE      TAKE these medications        cyclobenzaprine 10 MG tablet  Commonly known as:  FLEXERIL  Take 1 tablet (10 mg total) by mouth at bedtime as needed for muscle spasms. Back pain     furosemide 40 MG tablet  Commonly known as:  LASIX  Take 1 tablet (40 mg total) by mouth daily. Take  an additional tablet daily for weight gain of 3 lbs or more above dry weight.     losartan 50 MG tablet  Commonly known as:  COZAAR  Take 1 tablet (50 mg total) by mouth daily.     metoprolol succinate 50 MG 24 hr tablet  Commonly known as:  TOPROL-XL  Take 1 tablet (50 mg total) by mouth daily.     minoxidil 2 % external solution  Commonly known as:  ROGAINE  Apply topically 2 (two) times daily.     Potassium Chloride ER 20 MEQ Tbcr  Take 1 tablet by mouth daily. TAKE 1 extra tablet when you take extra Furosemide (Lasix)     simvastatin 10 MG tablet  Commonly known as:  ZOCOR  Take 1 tablet (10 mg total) by mouth daily.        Discharge Instructions    Diet - low sodium heart healthy    Complete by:  As directed      Increase activity slowly    Complete by:  As directed           Follow-up Information    Follow up with Truitt Merle, NP On 06/24/2015.   Specialties:  Nurse Practitioner, Interventional Cardiology, Cardiology, Radiology   Why:  See provider at 2:00 pm, please arrive 15 minutes early for paperwork.   Contact information:   [place]. [address] [place] [address] [phone]       BRING ALL MEDICATIONS WITH YOU TO FOLLOW UP APPOINTMENTS  Time spent with patient to include physician time: > 30 min Signed: Rosaria Ferries, PA-C 06/17/2015, 10:43 AM Co-Sign MD

## 2015-06-17 NOTE — Telephone Encounter (Signed)
New problem   Pt has TCM w/Lori 1.31.17 per Suanne Marker calling.

## 2015-06-17 NOTE — Progress Notes (Signed)
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 She initially had recovery of her LV function in 2008 but in 2011 her EF decreased to 25%. LHC in 2004 showed mild irregularities in the RCA but otherwise normal cors. Last echo 01/21/14 showed mod LVH, [name] She has history of multiple admissions over the last 10 years due to medication noncompliance.  She has not taken her medications in about 2 weeks. About a week ago she noticed progressive DOE and orthopnea. She does not weigh herself regularly but feels she has gained weight. Last night she began noticing intermittent chest burning, lasting a few minutes at a time, resolving spontaneously, not brought on by anything in particular. She continues to smoke cigarettes but denies illicit drug use other than THC. Her son says she eats a lot of fried food. No fevers, chills, syncope. +Mild LEE. In the ER she has been hypertensive up to 170/118. She received 60mg  IV Lasix and has started to put out urine (not measured yet) with improvement in symptoms. Labs notable for troponin 0.63->1.17; BNP 858. Normal CBC/BMET. CXR with bibasilar interstitial prominence, likely atelectatic changes, no focal consolidation. She currently denies any acute symptoms.  Past Medical History  Diagnosis Date  . 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 Peripartum cardiomyopathy 1994  . Nonischemic cardiomyopathy ([place]) 1994; 2017    a. iniatially ?2/2 peripartum in 1994 - improved by 2008 then worsening EF in 2011 back down to EF 25-30%. b. echo 01/21/14 showed mod LVH, [name]; [name], [name], [name],   . Angina pectoris with normal coronary arteriogram ([place]) 2017     Had + Troponin c/w ? NSTEMI due to A on C CHF  . NSTEMI (non-ST elevated myocardial infarction) ([place]) 05/2015    Normal Coronaries.  . Fibroids   . Abnormal uterine bleeding   . Menorrhagia   . DYSLIPIDEMIA   . Chronic combined systolic and diastolic CHF (congestive heart failure) ([place])   . Hypertension   . Stroke [facility]) 2011  . Sleep apnea 2015    CPAP 12/2013  . Fibroids   . Anemia   . H/O noncompliance with medical treatment, [name]     Social History   Social History  . Marital Status: Single    Spouse Name: N/A  . Number of Children: 3  . Years of Education: 12   Occupational History  . unemployed    Social History Main Topics  . Smoking status: Current Every Day Smoker -- 0.10 packs/day for 30 years    Types: Cigarettes  . Smokeless tobacco: Never Used  . Alcohol Use: No  . Drug Use: No  . Sexual Activity: Yes    Birth Control/ Protection: IUD   Other Topics Concern  . None   Social History Narrative   Lives at home with 51 yo twins (Chelsea Branch and Chelsea Branch)   51 yo son lives outside the home   56 yo granddaughter     ECHO:Study Conclusions--06/15/15 - Left ventricle: The cavity size was mildly dilated. There was moderate concentric hypertrophy. Systolic function was severely reduced. The estimated ejection fraction was in the range of 25% to 30%. Severe diffuse hypokinesis. Although no diagnostic  regional wall motion abnormality was identified, this possibility cannot be completely excluded on the basis of this study. - Mitral valve: There was moderate regurgitation directed toward the free wall. - Left atrium: The atrium was moderately dilated.  Transthoracic echocardiography. M-mode, complete 2D, spectral Doppler, and color Doppler. Birthdate: Patient birthdate: 16-May-1966. Age: Patient is 50 yr old. Sex: Gender: female. BMI: 29.2 kg/m^2. Blood pressure:   128/92 Patient  status: Inpatient. Study date: Study date: 06/15/2015. Study time: 01:18 PM. Location: Bedside.  BNP    Component Value Date/Time   BNP 858.2* 06/14/2015 0500    ProBNP    Component Value Date/Time   PROBNP 9.0 03/12/2014 1450     Education Assessment and Provision:  Detailed education and instructions provided on heart failure disease management including the following:  Signs and symptoms of Heart Failure When to call the physician Importance of daily weights Low sodium diet Fluid restriction Medication management Anticipated future follow-up appointments  Patient education given on each of the above topics.  Patient acknowledges understanding and acceptance of all instructions.  I spoke at length with Chelsea Branch regarding her HF diagnosis.  She does not have a scale for home use and I have provided one for her.  We discussed the importance of daily weights and how weight increases relate to the signs and symptoms of HF.   I reviewed a low sodium diet and high sodium foods to avoid.  She asked many pertinent questions related to how to prepare foods and what she eats frequently.  I reinforced the need to take all medications just as prescribed and she tells me that she will not have any problems getting or taking medications.  She will follow with [facility].  I have encouraged her to call me after discharge with concerns or questions related to her HF.  Education Materials:  "Living Better With Heart Failure" Booklet, Daily Weight Tracker Tool   High Risk Criteria for Readmission and/or Poor Patient Outcomes:   EF <30%- yes 25-30%  2 or more admissions in 6 months- No  Difficult social situation- No  Demonstrates medication noncompliance- yes --was not taking medications prior to admission    Barriers of Care: Knowledge and compliance  Discharge Planning:  She will discharge to home to [name] with twin sons (48 years old) and boyfriend.

## 2015-06-17 NOTE — Progress Notes (Signed)
Pt discharged to home via w/c, condition stable, discharge education completed with verbalization of understanding from pt.  Edward Qualia RN

## 2015-06-17 NOTE — Discharge Instructions (Signed)
Heart Failure  Heart failure means your heart has trouble pumping blood. This makes it hard for your body to work well. Heart failure is usually a long-term (chronic) condition. You must take good care of yourself and follow your doctor's treatment plan.  HOME CARE   Take your heart medicine as told by your doctor.    Do not stop taking medicine unless your doctor tells you to.    Do not skip any dose of medicine.    Refill your medicines before they run out.    Take other medicines only as told by your doctor or pharmacist.   Stay active if told by your doctor. The elderly and people with severe heart failure should talk with a doctor about physical activity.   Eat heart-healthy foods. Choose foods that are without trans fat and are low in saturated fat, cholesterol, and salt (sodium). This includes fresh or frozen fruits and vegetables, fish, lean meats, fat-free or low-fat dairy foods, whole grains, and high-fiber foods. Lentils and dried peas and beans (legumes) are also good choices.   Limit salt if told by your doctor.   Cook in a healthy way. Roast, grill, broil, bake, poach, steam, or stir-fry foods.   Limit fluids as told by your doctor.   Weigh yourself every morning. Do this after you pee (urinate) and before you eat breakfast. Write down your weight to give to your doctor.   Take your blood pressure and write it down if your doctor tells you to.   Ask your doctor how to check your pulse. Check your pulse as told.   Lose weight if told by your doctor.   Stop smoking or chewing tobacco. Do not use gum or patches that help you quit without your doctor's approval.   Schedule and go to doctor visits as told.   Nonpregnant women should have no more than 1 drink a day. Men should have no more than 2 drinks a day. Talk to your doctor about drinking alcohol.   Stop illegal drug use.   Stay current with shots (immunizations).   Manage your health conditions as told by your doctor.   Learn to  manage your stress.   Rest when you are tired.   If it is really hot outside:    Avoid intense activities.    Use air conditioning or fans, or get in a cooler place.    Avoid caffeine and alcohol.    Wear loose-fitting, lightweight, and light-colored clothing.   If it is really cold outside:    Avoid intense activities.    Layer your clothing.    Wear mittens or gloves, a hat, and a scarf when going outside.    Avoid alcohol.   Learn about heart failure and get support as needed.   Get help to maintain or improve your quality of life and your ability to care for yourself as needed.  GET HELP IF:    You gain weight quickly.   You are more short of breath than usual.   You cannot do your normal activities.   You tire easily.   You cough more than normal, especially with activity.   You have any or more puffiness (swelling) in areas such as your hands, feet, ankles, or belly (abdomen).   You cannot sleep because it is hard to breathe.   You feel like your heart is beating fast (palpitations).   You get dizzy or light-headed when you stand up.  GET HELP   RIGHT AWAY IF:    You have trouble breathing.   There is a change in mental status, such as becoming less alert or not being able to focus.   You have chest pain or discomfort.   You faint.  MAKE SURE YOU:    Understand these instructions.   Will watch your condition.   Will get help right away if you are not doing well or get worse.     This information is not intended to replace advice given to you by your health care provider. Make sure you discuss any questions you have with your health care provider.     Document Released: 02/17/2008 Document Revised: 05/31/2014 Document Reviewed: 06/26/2012  Elsevier Interactive Patient Education 2016 [employer] Inc[employer]

## 2015-06-18 NOTE — Telephone Encounter (Signed)
Patient contacted regarding discharge from [facility] on 06/17/15.  Patient understands to follow up with provider Kathrene Alu on 06/24/15 at 2:00 at [facility]. Patient understands discharge instructions? yes Patient understands medications and regiment? yes Patient understands to bring all medications to this visit? yes  States she is doing good.  No CP or SOB.  Is monitoring her Wt which was 183.4 lb this AM.  On D/C wt was 192 lbs.  States (R) arm-cath site slightly bruised but no redness or swelling.  Reviewed her medications. States she is trying to watch her diet and watching the amount of sodium she intakes.  Advised to bring her medications to office visit.  She verbalizes understanding of her medications and care.

## 2015-06-19 ENCOUNTER — Telehealth (HOSPITAL_COMMUNITY): Payer: Self-pay | Admitting: Surgery

## 2015-06-19 NOTE — Telephone Encounter (Signed)
Heart Failure Nurse Navigator Post Discharge Telephone Call  I called to check on Chelsea Branch after her hospitalization.  She tells me that she has been weighing and weight today was 184.1lbs versus discharge weight of 192.2 lbs on 1/24?  She says that she is feeling much better and asked specific questions related to eating low sodium.  She asked several of the same questions that had been reviewed during our previous conversation while she was hospitalized.  She seems to lack understanding of basic low sodium diet concepts and could benefit from further teaching regarding low sodium diets.  I encouraged label checking for Sodium content again as I had during our previous conversation  (? Literacy level).  I also encouraged her to continue the good work weighing each day and call me back with any concerns or questions related to her HF.   She is aware of follow-up at [facility] on 06/24/15 at 2pm.

## 2015-06-24 ENCOUNTER — Encounter: Payer: Self-pay | Admitting: Nurse Practitioner

## 2015-06-24 ENCOUNTER — Ambulatory Visit (INDEPENDENT_AMBULATORY_CARE_PROVIDER_SITE_OTHER): Payer: Medicaid Other | Admitting: Nurse Practitioner

## 2015-06-24 ENCOUNTER — Other Ambulatory Visit: Payer: Self-pay | Admitting: Family Medicine

## 2015-06-24 VITALS — BP 110/76 | HR 85 | Ht 68.0 in | Wt 191.0 lb

## 2015-06-24 DIAGNOSIS — I5042 Chronic combined systolic (congestive) and diastolic (congestive) heart failure: Secondary | ICD-10-CM

## 2015-06-24 DIAGNOSIS — I10 Essential (primary) hypertension: Secondary | ICD-10-CM

## 2015-06-24 DIAGNOSIS — I429 Cardiomyopathy, unspecified: Secondary | ICD-10-CM

## 2015-06-24 DIAGNOSIS — Z720 Tobacco use: Secondary | ICD-10-CM

## 2015-06-24 DIAGNOSIS — I428 Other cardiomyopathies: Secondary | ICD-10-CM

## 2015-06-24 LAB — BASIC METABOLIC PANEL
BUN: 11 mg/dL (ref 7–25)
CO2: 27 mmol/L (ref 20–31)
Calcium: 9.4 mg/dL (ref 8.6–10.4)
Chloride: 104 mmol/L (ref 98–110)
Creat: 0.85 mg/dL (ref 0.50–1.05)
Glucose, Bld: 78 mg/dL (ref 65–99)
Potassium: 3.9 mmol/L (ref 3.5–5.3)
Sodium: 138 mmol/L (ref 135–146)

## 2015-06-24 NOTE — Progress Notes (Signed)
CARDIOLOGY OFFICE NOTE  Date:  06/24/2015    Chelsea Branch Date of Birth: [birth year] Medical Record [id number]  PCP:  Minerva Ends, MD  Cardiologist:  Caryl Comes    Chief Complaint  Patient presents with  . Cardiomyopathy  . Hypertension    Post hospital visit - seen for Dr. Caryl Comes    History of Present Illness: Chelsea Branch is a 50 y.o. female who presents today for a post hospital/TOC visit. Seen for Dr. Caryl Comes. 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   She presented to the hospital earlier this month with increasing SOB, DOE, orthopnea and chest burning. Admitted for further evaluation. Diuresed with IV Lasix. BP elevated - she was not taking her medicine. Ruled in for NSTEMI. EF 25 to 30% with diffuse hypokinesis. She underwent cardiac cath - no CAD but did confirm the low EF - felt to be due to HTN.   Comes in today. Here alone. She says she feels pretty good. Breathing has improved. No chest pain. Has stopped smoking. Already back to work - works at [employer] here at [employer]. Restricting her salt. Does have scales and has a good understanding of what to do if weight increases. She is now restricting her salt. She is taking her medicines.   Past Medical History  Diagnosis Date  . 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 Peripartum cardiomyopathy 1994  . Nonischemic cardiomyopathy ([place]) 1994; 2017    a. iniatially ?2/2 peripartum in 1994 - improved by 2008 then worsening EF in 2011 back down to EF 25-30%. b. echo 01/21/14 showed mod LVH, [name]; [name], [name], [name],   . Angina pectoris with normal coronary arteriogram ([place]) 2017    Had + Troponin c/w ? NSTEMI due to A on C CHF  . NSTEMI (non-ST elevated myocardial infarction) ([place]) 05/2015    Normal Coronaries.  . Fibroids   . Abnormal uterine bleeding     . Menorrhagia   . DYSLIPIDEMIA   . Chronic combined systolic and diastolic CHF (congestive heart failure) ([place])   . Hypertension   . Stroke [facility]) 2011  . Sleep apnea 2015    CPAP 12/2013  . Fibroids   . Anemia   . H/O noncompliance with medical treatment, [name]     Past Surgical History  Procedure Laterality Date  . Loop recorder implant  ~ 2000  . Tibial tuberclerplasty    . Cesarean section  1992  1994  . Tubal ligation  1994  . Cardiac catheterization N/A 06/16/2015    Procedure: Left Heart Cath and Coronary Angiography;  Surgeon: Jettie Booze, MD;  Location: [facility];  Service: Cardiovascular;  Laterality: N/A;     Medications: Current Outpatient Prescriptions  Medication Sig Dispense Refill  . cyclobenzaprine (FLEXERIL) 10 MG tablet Take 1 tablet (10 mg total) by mouth at bedtime as needed for muscle spasms. Back pain 30 tablet 0  . furosemide (LASIX) 40 MG tablet Take 1 tablet (40 mg total) by mouth daily. Take an additional tablet daily for weight gain of 3 lbs or more above dry weight. 60 tablet 11  . losartan (COZAAR) 50 MG tablet Take 1 tablet (50 mg total) by mouth daily. 30 tablet 11  . megestrol (MEGACE) 40 MG tablet  TAKE 1/2 TABLET BY MOUTH DAILY 30 tablet 0  . metoprolol succinate (TOPROL-XL) 50 MG 24 hr tablet Take 1 tablet (50 mg total) by mouth daily. 30 tablet 11  . minoxidil (ROGAINE) 2 % external solution Apply topically 2 (two) times daily. 120 mL 0  . Potassium Chloride ER 20 MEQ TBCR Take 1 tablet by mouth daily. TAKE 1 extra tablet when you take extra Furosemide (Lasix) 60 tablet 11  . simvastatin (ZOCOR) 10 MG tablet Take 1 tablet (10 mg total) by mouth daily. 30 tablet 11   No current facility-administered medications for this visit.    Allergies: Allergies  Allergen Reactions  . Ace Inhibitors     REACTION: Cough    Social History: The patient  reports that she quit  smoking about 3 weeks ago. Her smoking use included Cigarettes. She has a 3 pack-year smoking history. She has never used smokeless tobacco. She reports that she does not drink alcohol or use illicit drugs.   Family History: The patient's family history includes Cancer in her maternal grandmother; Hypertension in her sister. There is no history of Other or Heart disease.   Review of Systems: Please see the history of present illness.   Otherwise, the review of systems is positive for none.   All other systems are reviewed and negative.   Physical Exam: VS:  BP 110/76 mmHg  Pulse 85  Ht 5\' 8"  (1.727 m)  Wt 191 lb (86.637 kg)  BMI 29.05 kg/m2 .  BMI Body mass index is 29.05 kg/(m^2).  Wt Readings from Last 3 Encounters:  06/24/15 191 lb (86.637 kg)  06/17/15 192 lb 3.2 oz (87.181 kg)  06/09/15 197 lb (89.359 kg)    General: Pleasant. Well developed, well nourished and in no acute distress.  HEENT: Normal. Neck: Supple, no JVD, carotid bruits, or masses noted.  Cardiac: Regular rate and rhythm. No murmurs, rubs, or gallops. No edema.  Respiratory:  Lungs are clear to auscultation bilaterally with normal work of breathing.  GI: Soft and nontender.  MS: No deformity or atrophy. Gait and ROM intact. Skin: Warm and dry. Color is normal.  Neuro:  Strength and sensation are intact and no gross focal deficits noted.  Psych: Alert, appropriate and with normal affect.   LABORATORY DATA:  EKG:  EKG is ordered today. This demonstrates NSR with diffuse T wave changes - reviewed with Dr. Caryl Comes.    Lab Results  Component Value Date   WBC 5.4 06/17/2015   HGB 13.6 06/17/2015   HCT 41.8 06/17/2015   PLT 259 06/17/2015   GLUCOSE 97 06/16/2015   CHOL 135 06/15/2015   TRIG 53 06/15/2015   HDL 35* 06/15/2015   LDLCALC 89 06/15/2015   ALT 27 06/14/2015   AST 34 06/14/2015   NA 139 06/16/2015   K 3.9 06/16/2015   CL 105 06/16/2015   CREATININE 0.75 06/16/2015   BUN 9 06/16/2015   CO2 23  06/16/2015   TSH 0.638 06/14/2015   INR 1.06 06/14/2015   HGBA1C 5.8* 06/14/2015    BNP (last 3 results)  Recent Labs  06/14/15 0500  BNP 858.2*    ProBNP (last 3 results) No results for input(s): PROBNP in the last 8760 hours.   Other Studies Reviewed Today:  Echo: 06/15/2015 - Left ventricle: The cavity size was mildly dilated. There was moderate concentric hypertrophy. Systolic function was severely reduced. The estimated ejection fraction was in the range of 25% to 30%. Severe diffuse hypokinesis. Although  no diagnostic regional wall motion abnormality was identified, this possibility cannot be completely excluded on the basis of this study. - Mitral valve: There was moderate regurgitation directed toward the free wall. - Left atrium: The atrium was moderately dilated.  Procedures    Left Heart Cath and Coronary Angiography    Conclusion     There is severe left ventricular systolic dysfunction.  No significant CAD.  LVEDP elevated.  Nonischemic cardiomyopathy. Continue medical therapy.   [name]. Consider diuresis.      Assessment/Plan: 1. Post cardiac cath - no significant CAD noted   2. NICM with chronic systolic HF - she is improved clinically. Weight is down. Symptoms have improved. Would favor continued medicines and plan to repeat echo in about 3 months. Reminded about salt restriction and continuing to weigh daily.   3. Noncomliance - she seems to be back on track with taking care of herself.  4. HTN - BP has improved. Recheck by me is 122/80. I have left her on her current regimen. It was simplified at her last admission to help with compliance.   5. Abnormal EKG - reviewed with Dr. Caryl Comes - no significant CAD noted at time of cath. No neuro complaints today. Will plan on repeating in one month on return OV with me.   6. Tobacco abuse - she has stopped - encouraged her to continue with abstinence.   Current medicines  are reviewed with the patient today.  The patient does not have concerns regarding medicines other than what has been noted above.  The following changes have been made:  See above.  Labs/ tests ordered today include:    Orders Placed This Encounter  Procedures  . Basic metabolic panel  . Brain natriuretic peptide  . EKG 12-Lead     Disposition:   FU with me in 4 weeks with EKG.   Patient is agreeable to this plan and will call if any problems develop in the interim.   Signed: Burtis Junes, RN, ANP-C 06/24/2015 2:53 PM  [facility] [address] [place], [place]  [address] Phone: [phone] Fax: [phone]

## 2015-06-24 NOTE — Patient Instructions (Addendum)
We will be checking the following labs today - BMET and BNP   Medication Instructions:    Continue with your current medicines.     Testing/Procedures To Be Arranged:  N/A  Follow-Up:   See me in one month with an EKG    Other Special Instructions:   Keep up the good work with not smoking!  Keep weighing daily  Extra dose of Lasix if your weight goes up more than 3 pounds overnight  Restrict your salt    If you need a refill on your cardiac medications before your next appointment, please call your pharmacy.   Call the [place] office at [phone] if you have any questions, problems or concerns.

## 2015-06-25 LAB — BRAIN NATRIURETIC PEPTIDE: Brain Natriuretic Peptide: 102.9 pg/mL — ABNORMAL HIGH (ref 0.0–100.0)

## 2015-07-02 ENCOUNTER — Telehealth: Payer: Self-pay | Admitting: Nurse Practitioner

## 2015-07-02 NOTE — Telephone Encounter (Signed)
New Message:  Pt called in stating that she is returning the nurse's call in regards to some lab results. Please f/u

## 2015-07-03 IMAGING — CR DG CHEST 2V
2 series · 2 of 2 positions shown · non-contrast
Comparison: 07/31/2013

CLINICAL DATA: Shortness of breath, edema, history of congestive
heart failure

EXAM:
CHEST  2 VIEW

[w chest pa]
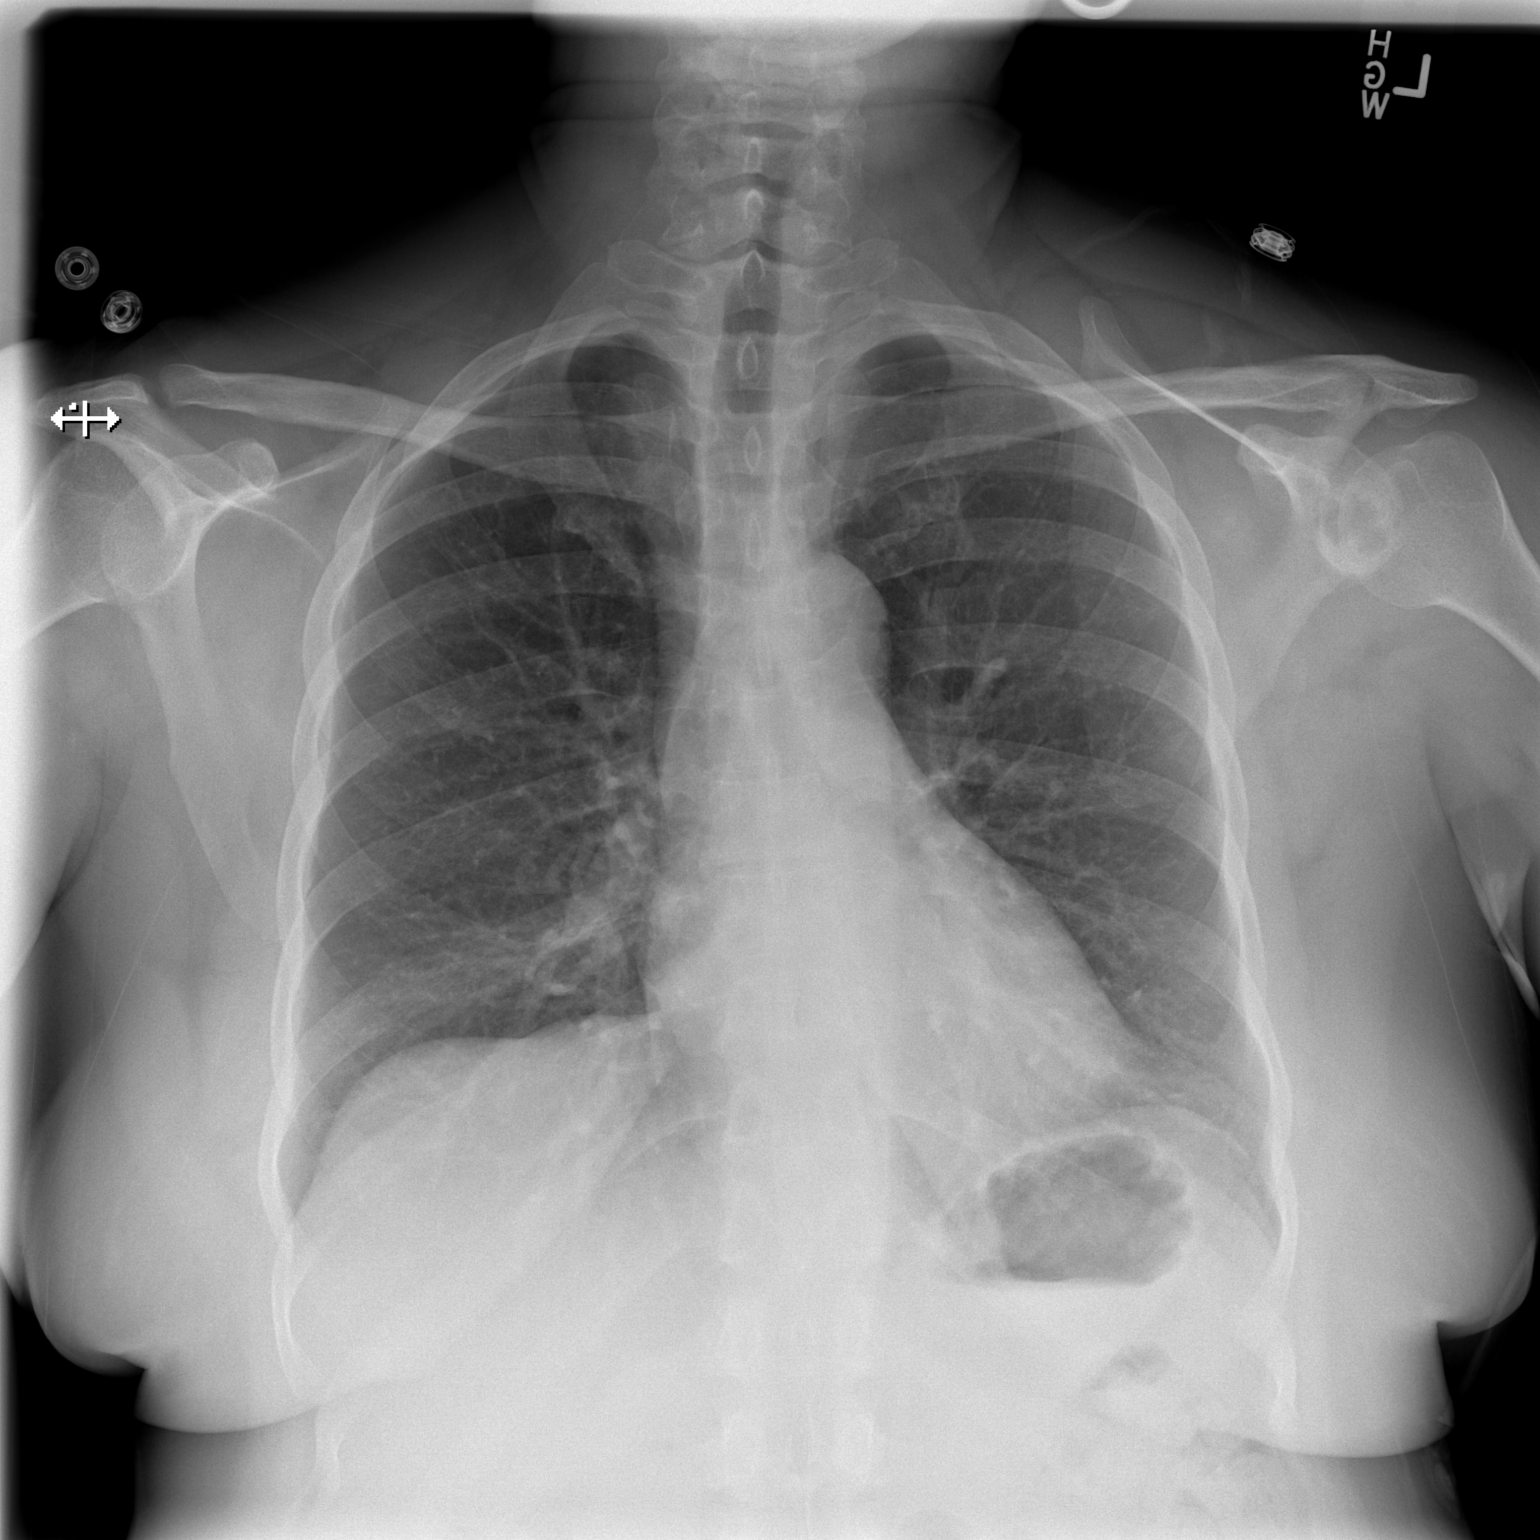

[w chest lat]
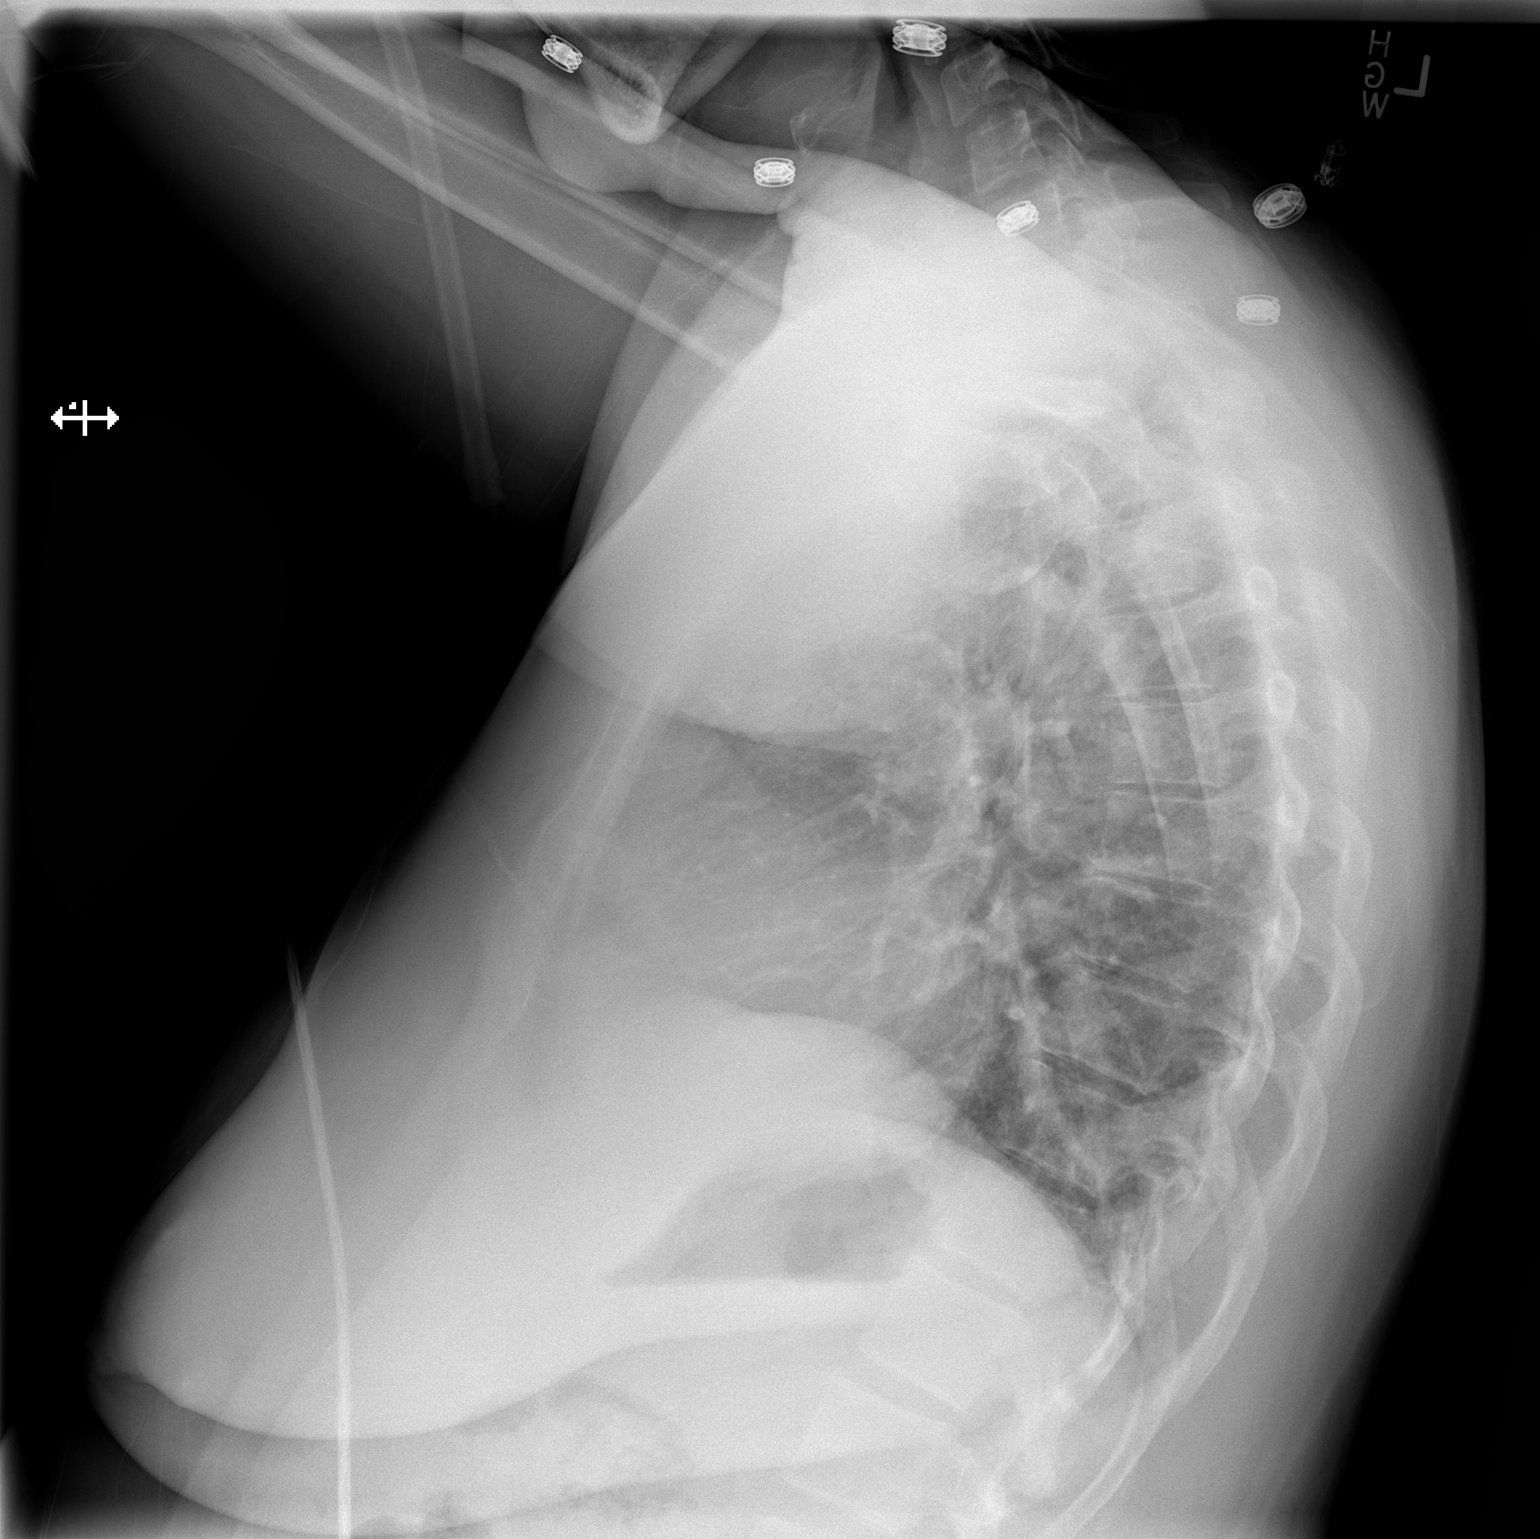

[2 of 2 positions shown; findings below may reference images not displayed]

FINDINGS: The heart size and mediastinal contours are within normal limits.
Both lungs are clear. The visualized skeletal structures are
unremarkable.
IMPRESSION: No active cardiopulmonary disease.

## 2015-07-10 MED FILL — MEGESTROL 40 MG TABLET: 40 | 30 days supply | Qty: 15 | Fill #0

## 2015-07-10 MED FILL — FUROSEMIDE 40 MG TABLET: 40 | 30 days supply | Qty: 60 | Fill #0

## 2015-07-14 MED FILL — METOPROLOL SUCC ER 25 MG TA: 25 | 30 days supply | Qty: 30 | Fill #4

## 2015-07-14 MED FILL — LOSARTAN POTASSIUM 50 MG TA: 50 | 30 days supply | Qty: 30 | Fill #4

## 2015-07-14 MED FILL — SIMVASTATIN 10 MG TABLET: 10 | 30 days supply | Qty: 30 | Fill #3

## 2015-07-14 MED FILL — FUROSEMIDE 40 MG TABLET: 40 | 30 days supply | Qty: 30 | Fill #2

## 2015-07-14 MED FILL — POTASSIUM CL ER 20 MEQ TAB: 20 | 30 days supply | Qty: 60 | Fill #2

## 2015-07-16 ENCOUNTER — Encounter: Payer: Self-pay | Admitting: Nurse Practitioner

## 2015-07-16 ENCOUNTER — Ambulatory Visit (INDEPENDENT_AMBULATORY_CARE_PROVIDER_SITE_OTHER): Payer: Medicaid Other | Admitting: Nurse Practitioner

## 2015-07-16 VITALS — BP 118/86 | HR 99 | Ht 68.0 in | Wt 195.0 lb

## 2015-07-16 DIAGNOSIS — I429 Cardiomyopathy, unspecified: Secondary | ICD-10-CM

## 2015-07-16 DIAGNOSIS — I428 Other cardiomyopathies: Secondary | ICD-10-CM

## 2015-07-16 NOTE — Patient Instructions (Addendum)
We will be checking the following labs today - NONE   Medication Instructions:    Continue with your current medicines.     Testing/Procedures To Be Arranged:  Echocardiogram after April 22nd  Follow-Up:   See me a few days after your echocardiogram in April    Other Special Instructions:   Keep restricting your salt.     If you need a refill on your cardiac medications before your next appointment, please call your pharmacy.   Call the [place] office at [phone] if you have any questions, problems or concerns.

## 2015-07-16 NOTE — Progress Notes (Signed)
CARDIOLOGY OFFICE NOTE  Date:  07/16/2015    Chelsea Branch Date of Birth: 11-29-1965 Medical Record [id number]  PCP:  Minerva Ends, MD  Cardiologist:  Caryl Comes    Chief Complaint  Patient presents with  . Congestive Heart Failure    2 week check - seen for Dr. Caryl Comes    History of Present Illness: Chelsea Branch is a 50 y.o. female who presents today for a 4 week check.  Seen for Dr. Caryl Comes. 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   She presented to the hospital last month with increasing SOB, DOE, orthopnea and chest burning. Admitted for further evaluation. Diuresed with IV Lasix. BP elevated - she was not taking her medicine. Ruled in for NSTEMI. EF 25 to 30% with diffuse hypokinesis. She underwent cardiac cath - no CAD but did confirm the low EF - felt to be due to HTN.   I saw her almost a month ago - she was doing well. She had stopped smoking. Was weighing and restricting her salt. EKG was done and was abnormal with diffuse T wave changes and different from prior EKG. Not really clear as to why. Reviewed with Dr. Caryl Comes and we elected to just follow with plans to repeat.   Comes in today. Here alone. She is doing well. Not smoking. Restricting her salt. Her weight for the most part has been stable at home. She uses extra Lasix prn weight gain and has a good understanding about when to take. No chest pain. Not dizzy. More limited by her back and a little sleepy today because she took a Flexeril last night. 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   Past Medical History  Diagnosis Date  . 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 Peripartum cardiomyopathy 1994  . Nonischemic cardiomyopathy ([name]; 2017    a. iniatially ?2/2 peripartum in 1994 - improved by 2008 then worsening EF in 2011 back down to EF 25-30%. b. echo  01/21/14 showed mod LVH, [name]; [name], [name], [name],   . Angina pectoris with normal coronary arteriogram ([place]) 2017    Had + Troponin c/w ? NSTEMI due to A on C CHF  . NSTEMI (non-ST elevated myocardial infarction) ([place]) 05/2015    Normal Coronaries.  . Fibroids   . Abnormal uterine bleeding   . Menorrhagia   . DYSLIPIDEMIA   . Chronic combined systolic and diastolic CHF (congestive heart failure) ([place])   . Hypertension   . Stroke [facility]) 2011  . Sleep apnea 2015    CPAP 12/2013  . Fibroids   . Anemia   . H/O noncompliance with medical treatment, [name]     Past Surgical History  Procedure Laterality Date  . Loop recorder implant  ~ 2000  . Tibial tuberclerplasty    . Cesarean section  1992  1994  . Tubal ligation  1994  . Cardiac catheterization N/A 06/16/2015    Procedure: Left Heart Cath and Coronary Angiography;  Surgeon: Jettie Booze, MD;  Location: [facility];  Service: Cardiovascular;  Laterality: N/A;     Medications: Current Outpatient Prescriptions  Medication Sig Dispense Refill  . cyclobenzaprine (FLEXERIL) 10 MG tablet Take 1 tablet (10 mg total) by mouth at bedtime as  needed for muscle spasms. Back pain 30 tablet 0  . furosemide (LASIX) 40 MG tablet Take 1 tablet (40 mg total) by mouth daily. Take an additional tablet daily for weight gain of 3 lbs or more above dry weight. 60 tablet 11  . losartan (COZAAR) 50 MG tablet Take 1 tablet (50 mg total) by mouth daily. 30 tablet 11  . megestrol (MEGACE) 40 MG tablet TAKE 1/2 TABLET BY MOUTH DAILY 30 tablet 0  . metoprolol succinate (TOPROL-XL) 50 MG 24 hr tablet Take 1 tablet (50 mg total) by mouth daily. 30 tablet 11  . Potassium Chloride ER 20 MEQ TBCR Take 1 tablet by mouth daily. TAKE 1 extra tablet when you take extra Furosemide (Lasix) 60 tablet 11  . simvastatin (ZOCOR) 10 MG tablet Take 1 tablet (10 mg total) by  mouth daily. 30 tablet 11   No current facility-administered medications for this visit.    Allergies: Allergies  Allergen Reactions  . Ace Inhibitors     REACTION: Cough    Social History: The patient  reports that she quit smoking about 6 weeks ago. Her smoking use included Cigarettes. She has a 3 pack-year smoking history. She has never used smokeless tobacco. She reports that she does not drink alcohol or use illicit drugs.   Family History: The patient's family history includes Cancer in her maternal grandmother; Hypertension in her sister. There is no history of Other or Heart disease.   Review of Systems: Please see the history of present illness.   Otherwise, the review of systems is positive for none.   All other systems are reviewed and negative.   Physical Exam: VS:  BP 118/86 mmHg  Pulse 99  Ht 5\' 8"  (1.727 m)  Wt 195 lb (88.451 kg)  BMI 29.66 kg/m2  SpO2 98% .  BMI Body mass index is 29.66 kg/(m^2).  Wt Readings from Last 3 Encounters:  07/16/15 195 lb (88.451 kg)  06/24/15 191 lb (86.637 kg)  06/17/15 192 lb 3.2 oz (87.181 kg)    General: Pleasant. Well developed, well nourished and in no acute distress.  HEENT: Normal. Neck: Supple, no JVD, carotid bruits, or masses noted.  Cardiac: Regular rate and rhythm. No murmurs, rubs, or gallops. No edema.  Respiratory:  Lungs are clear to auscultation bilaterally with normal work of breathing.  GI: Soft and nontender.  MS: No deformity or atrophy. Gait and ROM intact. Skin: Warm and dry. Color is normal.  Neuro:  Strength and sensation are intact and no gross focal deficits noted.  Psych: Alert, appropriate and with normal affect.   LABORATORY DATA:  EKG:  EKG is ordered today. This shows NSR - she still has diffuse T wave changes - little improved from last tracing.   Lab Results  Component Value Date   WBC 5.4 06/17/2015   HGB 13.6 06/17/2015   HCT 41.8 06/17/2015   PLT 259 06/17/2015   GLUCOSE 78  06/24/2015   CHOL 135 06/15/2015   TRIG 53 06/15/2015   HDL 35* 06/15/2015   LDLCALC 89 06/15/2015   ALT 27 06/14/2015   AST 34 06/14/2015   NA 138 06/24/2015   K 3.9 06/24/2015   CL 104 06/24/2015   CREATININE 0.85 06/24/2015   BUN 11 06/24/2015   CO2 27 06/24/2015   TSH 0.638 06/14/2015   INR 1.06 06/14/2015   HGBA1C 5.8* 06/14/2015    BNP (last 3 results)  Recent Labs  06/14/15 0500  BNP 858.2*  ProBNP (last 3 results) No results for input(s): PROBNP in the last 8760 hours.   Other Studies Reviewed Today:  Echo: 06/15/2015 - Left ventricle: The cavity size was mildly dilated. There was moderate concentric hypertrophy. Systolic function was severely reduced. The estimated ejection fraction was in the range of 25% to 30%. Severe diffuse hypokinesis. Although no diagnostic regional wall motion abnormality was identified, this possibility cannot be completely excluded on the basis of this study. - Mitral valve: There was moderate regurgitation directed toward the free wall. - Left atrium: The atrium was moderately dilated.  Procedures    Left Heart Cath and Coronary Angiography    Conclusion     There is severe left ventricular systolic dysfunction.  No significant CAD.  LVEDP elevated.  Nonischemic cardiomyopathy. Continue medical therapy.   [name]. Consider diuresis.      Assessment/Plan: 1. Post cardiac cath - no significant CAD noted   2. NICM with chronic systolic HF - she is improved clinically.  Symptoms have improved. Would favor continued medicines and plan to repeat echo in about 3 months - after 09/13/15. Reminded about salt restriction and continuing to weigh daily. I will see her back afterwards.   3. Noncomliance - she seems to be back on track with taking care of herself.  4. HTN - BP has improved.  I have left her on her current regimen. It was simplified at her last admission to help with  compliance.   5. Abnormal EKG - reviewed with Dr. Caryl Comes at last visit - EKG today looks a little better - she did not have significant CAD noted at time of cath. No neuro complaints today.   6. Tobacco abuse - she has stopped - encouraged her to continue with abstinence.           Current medicines are reviewed with the patient today.  The patient does not have concerns regarding medicines other than what has been noted above.  The following changes have been made:  See above.  Labs/ tests ordered today include:    Orders Placed This Encounter  Procedures  . EKG 12-Lead  . ECHOCARDIOGRAM COMPLETE     Disposition:   FU with me after echocardiogram.  Patient is agreeable to this plan and will call if any problems develop in the interim.   Signed: Burtis Junes, RN, ANP-C 07/16/2015 2:02 PM  [facility] [address] [place], [place]  [address] Phone: [phone] Fax: [phone]

## 2015-08-15 MED FILL — FUROSEMIDE 40 MG TABLET: 40 | 30 days supply | Qty: 30 | Fill #3

## 2015-08-15 MED FILL — MEGESTROL 40 MG TABLET: 40 | 30 days supply | Qty: 15 | Fill #1

## 2015-08-15 MED FILL — LOSARTAN POTASSIUM 50 MG TA: 50 | 30 days supply | Qty: 30 | Fill #5

## 2015-08-15 MED FILL — SIMVASTATIN 10 MG TABLET: 10 | 30 days supply | Qty: 30 | Fill #4

## 2015-08-15 MED FILL — POTASSIUM CL ER 20 MEQ TAB: 20 | 30 days supply | Qty: 60 | Fill #3

## 2015-08-15 MED FILL — METOPROLOL SUCC ER 25 MG TA: 25 | 30 days supply | Qty: 30 | Fill #5

## 2015-09-12 ENCOUNTER — Other Ambulatory Visit (HOSPITAL_COMMUNITY): Payer: Medicaid Other

## 2015-09-23 ENCOUNTER — Other Ambulatory Visit: Payer: Self-pay | Admitting: Family Medicine

## 2015-09-23 ENCOUNTER — Telehealth: Payer: Self-pay | Admitting: Internal Medicine

## 2015-09-23 NOTE — Telephone Encounter (Signed)
I spoke with the patient. She reports that he BP was up this morning at [facility]- 149/50. However, she has been out of her metoprolol, losartan, and lasix since Friday. I have advised her that her losartan was refilled to the pharmacy about an hour ago and that she should have refills left on her prescription for the lasix and metoprolol. I have asked that she contact the pharmacy for refills on those 2 medications. She is agreeable. I have also advised her that the slight elevation of her BP this morning is most likely a reflection of her missed medications. She will obtain her refills today.

## 2015-09-23 NOTE — Telephone Encounter (Signed)
New Message  Pt requested to speak w/ RN concerning her bp medications- did not provide specific name of med. Please call back and discuss.

## 2015-09-25 ENCOUNTER — Telehealth: Payer: Self-pay

## 2015-09-25 NOTE — Telephone Encounter (Signed)
Attempted to do a PA for potassium Chloride today through [place] [facility]. I was informed she no longer has Medicaid coverage. Will inform [employer] [facility] of this fact.

## 2015-10-06 ENCOUNTER — Ambulatory Visit (HOSPITAL_COMMUNITY): Payer: Medicaid Other | Attending: Cardiovascular Disease

## 2015-10-08 MED FILL — LOSARTAN POTASSIUM 50 MG TA: 50 | 30 days supply | Qty: 30 | Fill #0

## 2015-10-08 MED FILL — SIMVASTATIN 10 MG TABLET: 10 | 30 days supply | Qty: 30 | Fill #0

## 2015-10-08 MED FILL — POTASSIUM CL ER 20 MEQ TAB: 20 | 30 days supply | Qty: 60 | Fill #4

## 2015-10-08 MED FILL — METOPROLOL SUCC ER 50 MG TA: 50 | 30 days supply | Qty: 30 | Fill #0

## 2015-10-08 MED FILL — ?FUROSEMIDE 40 MG TABLET: 40 | 30 days supply | Qty: 30 | Fill #4

## 2015-10-21 ENCOUNTER — Encounter: Payer: Medicaid Other | Admitting: Physician Assistant

## 2015-10-21 NOTE — Progress Notes (Signed)
Cardiology Office Note    Date:  10/21/2015   ID:  Chelsea Branch, DOB [birth year], MRN [id number]  PCP:  Minerva Ends, MD  Cardiologist:  Dr.Klein  No chief complaint on file.   History of Present Illness:  Chelsea Branch is a 50 y.o. female  NO SHOW    Past Medical History  Diagnosis Date  . 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 Peripartum cardiomyopathy 1994  . Nonischemic cardiomyopathy ([place]) 1994; 2017    a. iniatially ?2/2 peripartum in 1994 - improved by 2008 then worsening EF in 2011 back down to EF 25-30%. b. echo 01/21/14 showed mod LVH, [name]; [name], [name], [name],   . Angina pectoris with normal coronary arteriogram ([place]) 2017    Had + Troponin c/w ? NSTEMI due to A on C CHF  . NSTEMI (non-ST elevated myocardial infarction) ([place]) 05/2015    Normal Coronaries.  . Fibroids   . Abnormal uterine bleeding   . Menorrhagia   . DYSLIPIDEMIA   . Chronic combined systolic and diastolic CHF (congestive heart failure) ([place])   . Hypertension   . Stroke [facility]) 2011  . Sleep apnea 2015    CPAP 12/2013  . Fibroids   . Anemia   . H/O noncompliance with medical treatment, [name]     Past Surgical History  Procedure Laterality Date  . Loop recorder implant  ~ 2000  . Tibial tuberclerplasty    . Cesarean section  1992  1994  . Tubal ligation  1994  . Cardiac catheterization N/A 06/16/2015    Procedure: Left Heart Cath and Coronary Angiography;  Surgeon: Jettie Booze, MD;  Location: [facility];  Service: Cardiovascular;  Laterality: N/A;    Current Medications: Outpatient Prescriptions Prior to Visit  Medication Sig Dispense Refill  . cyclobenzaprine (FLEXERIL) 10 MG tablet Take 1 tablet (10 mg total) by mouth at bedtime as needed for muscle spasms. Back pain 30 tablet 0  . furosemide (LASIX) 40 MG tablet Take 1 tablet (40 mg total) by mouth daily. Take an additional  tablet daily for weight gain of 3 lbs or more above dry weight. 60 tablet 11  . losartan (COZAAR) 50 MG tablet Take 1 tablet (50 mg total) by mouth daily. Needs office visit for refills 30 tablet 0  . megestrol (MEGACE) 40 MG tablet TAKE 1/2 TABLET BY MOUTH DAILY 30 tablet 0  . metoprolol succinate (TOPROL-XL) 50 MG 24 hr tablet Take 1 tablet (50 mg total) by mouth daily. 30 tablet 11  . Potassium Chloride ER 20 MEQ TBCR Take 1 tablet by mouth daily. TAKE 1 extra tablet when you take extra Furosemide (Lasix) 60 tablet 11  . simvastatin (ZOCOR) 10 MG tablet Take 1 tablet (10 mg total) by mouth daily. Needs office visit for refills 30 tablet 0   No facility-administered medications prior to visit.     Allergies:   Ace inhibitors   Social History   Social History  . Marital Status: Single    Spouse Name: N/A  . Number of Children: 3  . Years of Education: 12   Occupational History  . unemployed    Social History Main Topics  . Smoking status: Former Smoker -- 0.10 packs/day for 30 years    Types: Cigarettes    Quit date: 06/03/2015  . Smokeless tobacco: Never Used  . Alcohol Use: No  .  Drug Use: No  . Sexual Activity: Yes    Birth Control/ Protection: IUD   Other Topics Concern  . Not on file   Social History Narrative   Lives at home with 51 yo twins ([place] and [place])   64 yo son lives outside the home   73 yo granddaughter      Family History:  The patient's family history includes Cancer in her maternal grandmother; Hypertension in her sister. There is no history of Other or Heart disease.   ROS:   Please see the history of present illness.    ROS All other systems reviewed and are negative.   PHYSICAL EXAM:   VS:  There were no vitals taken for this visit.   GEN: Well nourished, well developed, in no acute distress HEENT: normal Neck: no JVD, carotid bruits, or masses Cardiac: RRR; no murmurs, rubs, or gallops,no edema  Respiratory:  clear to auscultation  bilaterally, normal work of breathing GI: soft, nontender, nondistended, + BS MS: no deformity or atrophy Skin: warm and dry, no rash Neuro:  Alert and Oriented x 3, Strength and sensation are intact Psych: euthymic mood, full affect  Wt Readings from Last 3 Encounters:  07/16/15 195 lb (88.451 kg)  06/24/15 191 lb (86.637 kg)  06/17/15 192 lb 3.2 oz (87.181 kg)      Studies/Labs Reviewed:   EKG:  EKG is ordered today.  The ekg ordered today demonstrates   Recent Labs: 06/14/2015: ALT 27; TSH 0.638 06/17/2015: Hemoglobin 13.6; Platelets 259 06/24/2015: Brain Natriuretic Peptide 102.9*; BUN 11; Creat 0.85; Potassium 3.9; Sodium 138   Lipid Panel    Component Value Date/Time   CHOL 135 06/15/2015 0450   TRIG 53 06/15/2015 0450   HDL 35* 06/15/2015 0450   CHOLHDL 3.9 06/15/2015 0450   VLDL 11 06/15/2015 0450   LDLCALC 89 06/15/2015 0450    Additional studies/ records that were reviewed today include:  Echo: 06/15/2015 - Left ventricle: The cavity size was mildly dilated. There was   moderate concentric hypertrophy. Systolic function was severely   reduced. The estimated ejection fraction was in the range of 25%   to 30%. Severe diffuse hypokinesis. Although no diagnostic   regional wall motion abnormality was identified, this possibility   cannot be completely excluded on the basis of this study. - Mitral valve: There was moderate regurgitation directed toward   the free wall. - Left atrium: The atrium was moderately dilated.    Procedures        Left Heart Cath and Coronary Angiography         Conclusion         There is severe left ventricular systolic dysfunction.  No significant CAD.  LVEDP elevated.   Nonischemic cardiomyopathy.  Continue medical therapy.    [name].  Consider diuresis.                   ASSESSMENT:    No diagnosis found.   PLAN:  In order of problems listed above:       Medication Adjustments/Labs and Tests  Ordered: Current medicines are reviewed at length with the patient today.  Concerns regarding medicines are outlined above.  Medication changes, Labs and Tests ordered today are listed in the Patient Instructions below. There are no Patient Instructions on file for this visit.   Signed, Ermalinda Barrios, PA-C  10/21/2015 9:00 AM    [facility] [place], [place]  [address] Phone: [phone]; Fax: [phone]

## 2015-10-21 NOTE — Assessment & Plan Note (Signed)
No show

## 2015-10-23 ENCOUNTER — Inpatient Hospital Stay (HOSPITAL_COMMUNITY)
Admission: AD | Admit: 2015-10-23 | Discharge: 2015-10-23 | Disposition: A | Discharge status: Home / Self Care | Payer: Self-pay | Source: Ambulatory Visit | Attending: Obstetrics and Gynecology | Admitting: Obstetrics and Gynecology

## 2015-10-23 ENCOUNTER — Encounter (HOSPITAL_COMMUNITY): Payer: Self-pay

## 2015-10-23 DIAGNOSIS — R102 Pelvic and perineal pain: Secondary | ICD-10-CM

## 2015-10-23 DIAGNOSIS — Z888 Allergy status to other drugs, medicaments and biological substances status: Secondary | ICD-10-CM | POA: Insufficient documentation

## 2015-10-23 DIAGNOSIS — Z8673 Personal history of transient ischemic attack (TIA), and cerebral infarction without residual deficits: Secondary | ICD-10-CM | POA: Insufficient documentation

## 2015-10-23 DIAGNOSIS — G4730 Sleep apnea, unspecified: Secondary | ICD-10-CM | POA: Insufficient documentation

## 2015-10-23 DIAGNOSIS — N920 Excessive and frequent menstruation with regular cycle: Secondary | ICD-10-CM

## 2015-10-23 DIAGNOSIS — Z79899 Other long term (current) drug therapy: Secondary | ICD-10-CM | POA: Insufficient documentation

## 2015-10-23 DIAGNOSIS — I252 Old myocardial infarction: Secondary | ICD-10-CM | POA: Insufficient documentation

## 2015-10-23 DIAGNOSIS — N939 Abnormal uterine and vaginal bleeding, unspecified: Secondary | ICD-10-CM | POA: Insufficient documentation

## 2015-10-23 DIAGNOSIS — I5042 Chronic combined systolic (congestive) and diastolic (congestive) heart failure: Secondary | ICD-10-CM | POA: Insufficient documentation

## 2015-10-23 DIAGNOSIS — Z87891 Personal history of nicotine dependence: Secondary | ICD-10-CM | POA: Insufficient documentation

## 2015-10-23 DIAGNOSIS — Z8619 Personal history of other infectious and parasitic diseases: Secondary | ICD-10-CM | POA: Insufficient documentation

## 2015-10-23 DIAGNOSIS — Z975 Presence of (intrauterine) contraceptive device: Secondary | ICD-10-CM | POA: Insufficient documentation

## 2015-10-23 DIAGNOSIS — E785 Hyperlipidemia, unspecified: Secondary | ICD-10-CM | POA: Insufficient documentation

## 2015-10-23 DIAGNOSIS — I110 Hypertensive heart disease with heart failure: Secondary | ICD-10-CM | POA: Insufficient documentation

## 2015-10-23 DIAGNOSIS — N898 Other specified noninflammatory disorders of vagina: Secondary | ICD-10-CM | POA: Insufficient documentation

## 2015-10-23 DIAGNOSIS — I429 Cardiomyopathy, unspecified: Secondary | ICD-10-CM | POA: Insufficient documentation

## 2015-10-23 LAB — URINALYSIS, ROUTINE W REFLEX MICROSCOPIC
Bilirubin Urine: NEGATIVE
Glucose, UA: NEGATIVE mg/dL
KETONES UR: NEGATIVE mg/dL
Leukocytes, UA: NEGATIVE
Nitrite: NEGATIVE
PH: 6.5 (ref 5.0–8.0)
Protein, ur: NEGATIVE mg/dL
SPECIFIC GRAVITY, URINE: 1.015 (ref 1.005–1.030)

## 2015-10-23 LAB — CBC
HEMATOCRIT: 45 % (ref 36.0–46.0)
Hemoglobin: 15.5 g/dL — ABNORMAL HIGH (ref 12.0–15.0)
MCH: 29 pg (ref 26.0–34.0)
MCHC: 34.4 g/dL (ref 30.0–36.0)
MCV: 84.1 fL (ref 78.0–100.0)
Platelets: 238 10*3/uL (ref 150–400)
RBC: 5.35 MIL/uL — ABNORMAL HIGH (ref 3.87–5.11)
RDW: 14 % (ref 11.5–15.5)
WBC: 8.6 10*3/uL (ref 4.0–10.5)

## 2015-10-23 LAB — WET PREP, GENITAL
CLUE CELLS WET PREP: NONE SEEN
Sperm: NONE SEEN
TRICH WET PREP: NONE SEEN
Yeast Wet Prep HPF POC: NONE SEEN

## 2015-10-23 LAB — URINE MICROSCOPIC-ADD ON

## 2015-10-23 LAB — POCT PREGNANCY, URINE: PREG TEST UR: NEGATIVE

## 2015-10-23 MED ORDER — IBUPROFEN 600 MG PO TABS
600.0000 mg | ORAL_TABLET | Freq: Once | ORAL | Status: AC
  Administered 2015-10-23: 600 mg via ORAL
  Filled 2015-10-23: qty 1

## 2015-10-23 MED ORDER — METRONIDAZOLE 500 MG PO TABS
2000.0000 mg | ORAL_TABLET | Freq: Once | ORAL | Status: AC
  Administered 2015-10-23: 2000 mg via ORAL
  Filled 2015-10-23: qty 4

## 2015-10-23 NOTE — MAU Provider Note (Signed)
History     CSN: [id number]  Arrival date and time: 10/23/15 1318   First Provider Initiated Contact with Patient 10/23/15 1517      Chief Complaint  Patient presents with  . Pelvic Pain   HPI   Chelsea Branch is a 50 y.o. female [id number] here with pelvic pain. She has had this pain in the past and has been seen in the [facility] for this pain.  She presents with 5 days of spotting and heavy bleeding "from my uterus". I feel like someone is pulling on my tubes.   1 partner X 33 years. " I don't know what he been doing tho".  "He recently told her that he was involved with somebody else." She has been given trichomonas twice by this partner.    Patient had an IUD placed last year for DUB and fibroids. This has been working well for her.   OB History    Gravida Para Term Preterm AB TAB SAB Ectopic Multiple Living   2 2 2      1 3       Past Medical History  Diagnosis Date  . 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 Peripartum cardiomyopathy 1994  . Nonischemic cardiomyopathy ([place]) 1994; 2017    a. iniatially ?2/2 peripartum in 1994 - improved by 2008 then worsening EF in 2011 back down to EF 25-30%. b. echo 01/21/14 showed mod LVH, [name]; [name], [name], [name],   . Angina pectoris with normal coronary arteriogram ([place]) 2017    Had + Troponin c/w ? NSTEMI due to A on C CHF  . NSTEMI (non-ST elevated myocardial infarction) ([place]) 05/2015    Normal Coronaries.  . Fibroids   . Abnormal uterine bleeding   . Menorrhagia   . DYSLIPIDEMIA   . Chronic combined systolic and diastolic CHF (congestive heart failure) ([place])   . Hypertension   . Stroke [facility]) 2011  . Sleep apnea 2015    CPAP 12/2013  . Fibroids   . Anemia   . H/O noncompliance with medical treatment, [name]     Past Surgical History  Procedure Laterality Date  . Loop recorder implant  ~ 2000  . Tibial tuberclerplasty    . Cesarean section   1992  1994  . Tubal ligation  1994  . Cardiac catheterization N/A 06/16/2015    Procedure: Left Heart Cath and Coronary Angiography;  Surgeon: Jettie Booze, MD;  Location: [facility];  Service: Cardiovascular;  Laterality: N/A;    Family History  Problem Relation Age of Onset  . Other Neg Hx   . Heart disease Neg Hx   . Cancer Maternal Grandmother     uterine  . Hypertension Sister     Social History  Substance Use Topics  . Smoking status: Former Smoker -- 0.10 packs/day for 30 years    Types: Cigarettes    Quit date: 06/03/2015  . Smokeless tobacco: Never Used  . Alcohol Use: No    Allergies:  Allergies  Allergen Reactions  . Ace Inhibitors     REACTION: Cough    Prescriptions prior to admission  Medication Sig Dispense Refill Last Dose  . furosemide (LASIX) 40 MG tablet Take 1 tablet (40 mg total) by mouth daily. Take an additional tablet daily for weight gain of 3 lbs or more above dry weight. 60 tablet 11 10/23/2015 at Unknown time  .  losartan (COZAAR) 50 MG tablet Take 1 tablet (50 mg total) by mouth daily. Needs office visit for refills 30 tablet 0 10/23/2015 at Unknown time  . megestrol (MEGACE) 40 MG tablet TAKE 1/2 TABLET BY MOUTH DAILY 30 tablet 0 couple Month at Unknown time  . metoprolol succinate (TOPROL-XL) 50 MG 24 hr tablet Take 1 tablet (50 mg total) by mouth daily. 30 tablet 11 10/23/2015 at 0630  . Potassium Chloride ER 20 MEQ TBCR Take 1 tablet by mouth daily. TAKE 1 extra tablet when you take extra Furosemide (Lasix) 60 tablet 11 10/23/2015 at Unknown time  . simvastatin (ZOCOR) 10 MG tablet Take 1 tablet (10 mg total) by mouth daily. Needs office visit for refills 30 tablet 0 10/22/2015 at Unknown time  . cyclobenzaprine (FLEXERIL) 10 MG tablet Take 1 tablet (10 mg total) by mouth at bedtime as needed for muscle spasms. Back pain (Patient not taking: Reported on 10/23/2015) 30 tablet 0 Taking   Results for orders placed or performed during the  hospital encounter of 10/23/15 (from the past 48 hour(s))  Urinalysis, Routine w reflex microscopic (not at [facility])     Status: Abnormal   Collection Time: 10/23/15  1:50 PM  Result Value Ref Range   Color, Urine YELLOW YELLOW   APPearance CLEAR CLEAR   Specific Gravity, Urine 1.015 1.005 - 1.030   pH 6.5 5.0 - 8.0   Glucose, UA NEGATIVE NEGATIVE mg/dL   Hgb urine dipstick TRACE (A) NEGATIVE   Bilirubin Urine NEGATIVE NEGATIVE   Ketones, ur NEGATIVE NEGATIVE mg/dL   Protein, ur NEGATIVE NEGATIVE mg/dL   Nitrite NEGATIVE NEGATIVE   Leukocytes, UA NEGATIVE NEGATIVE  Urine microscopic-add on     Status: Abnormal   Collection Time: 10/23/15  1:50 PM  Result Value Ref Range   Squamous Epithelial / LPF 0-5 (A) NONE SEEN   WBC, UA 0-5 0 - 5 WBC/hpf   RBC / HPF 0-5 0 - 5 RBC/hpf   Bacteria, UA MANY (A) NONE SEEN   Urine-Other MUCOUS PRESENT   Pregnancy, urine POC     Status: None   Collection Time: 10/23/15  2:12 PM  Result Value Ref Range   Preg Test, Ur NEGATIVE NEGATIVE    Comment:        THE SENSITIVITY OF THIS METHODOLOGY IS >24 mIU/mL   Wet prep, genital     Status: Abnormal   Collection Time: 10/23/15  3:28 PM  Result Value Ref Range   Yeast Wet Prep HPF POC NONE SEEN NONE SEEN   Trich, Wet Prep NONE SEEN NONE SEEN   Clue Cells Wet Prep HPF POC NONE SEEN NONE SEEN   WBC, Wet Prep HPF POC MANY (A) NONE SEEN   Sperm NONE SEEN   CBC     Status: Abnormal   Collection Time: 10/23/15  3:28 PM  Result Value Ref Range   WBC 8.6 4.0 - 10.5 K/uL   RBC 5.35 (H) 3.87 - 5.11 MIL/uL   Hemoglobin 15.5 (H) 12.0 - 15.0 g/dL   HCT 45.0 36.0 - 46.0 %   MCV 84.1 78.0 - 100.0 fL   MCH 29.0 26.0 - 34.0 pg   MCHC 34.4 30.0 - 36.0 g/dL   RDW 14.0 11.5 - 15.5 %   Platelets 238 150 - 400 K/uL    Review of Systems  Constitutional: Negative for fever and chills.  Gastrointestinal: Positive for abdominal pain.  Genitourinary: Positive for dysuria.   Physical Exam   Blood  pressure 136/85,  pulse 79, temperature 99.3 F (37.4 C), resp. rate 18, height 5\' 8"  (1.727 m), weight 186 lb 3.2 oz (84.46 kg).  Physical Exam  Constitutional: She is oriented to person, place, and time. She appears well-developed and well-nourished. No distress.  HENT:  Head: Normocephalic.  Eyes: Pupils are equal, round, and reactive to light.  Neck: Neck supple.  Genitourinary:  Speculum exam: Vagina - Small amount of creamy, pink discharge, mild odor Cervix - No contact bleeding Bimanual exam: Cervix closed, no CMT. IUD string palpated at os.  Uterus non tender, normal size Adnexa non tender, no masses bilaterally GC/Chlam, wet prep done Chaperone present for exam.  Musculoskeletal: Normal range of motion.  Neurological: She is alert and oriented to person, place, and time.  Skin: Skin is warm. She is not diaphoretic.  Psychiatric: Her behavior is normal.    MAU Course  Procedures  None  MDM  CBC  Many WBC's on wet prep, patient is without CMT. Will treat presumptively for trichomonas based on exam and history. Patient is agreeable GC pending   Assessment and Plan   A:  1. Spotting   2. Vaginal discharge     P:  Discharge home in stable condition Patient will be contacted if GC is positive; patient aware Return to MAU if symptoms worsen Follow up with PCP as needed.  Pelvic US ordered outpatient for IUD placement.   Lezlie Lye, NP 10/23/2015 7:17 PM

## 2015-10-23 NOTE — MAU Note (Signed)
Pt reports she has had pelvic pain and spotting for 5 days. Pt has IUD inplace for about 1 year. She reports she does spot with it but this is different.

## 2015-10-23 NOTE — Discharge Instructions (Signed)
Sexually Transmitted Disease °A sexually transmitted disease (STD) is a disease or infection that may be passed (transmitted) from person to person, usually during sexual activity. This may happen by way of saliva, semen, blood, vaginal mucus, or urine. Common STDs include: °· Gonorrhea. °· Chlamydia. °· Syphilis. °· HIV and AIDS. °· Genital herpes. °· Hepatitis B and C. °· Trichomonas. °· Human papillomavirus (HPV). °· Pubic lice. °· Scabies. °· Mites. °· Bacterial vaginosis. °WHAT ARE CAUSES OF STDs? °An STD may be caused by bacteria, a virus, or parasites. STDs are often transmitted during sexual activity if one person is infected. However, they may also be transmitted through nonsexual means. STDs may be transmitted after:  °· Sexual intercourse with an infected person. °· Sharing sex toys with an infected person. °· Sharing needles with an infected person or using unclean piercing or tattoo needles. °· Having intimate contact with the genitals, mouth, or rectal areas of an infected person. °· Exposure to infected fluids during birth. °WHAT ARE THE SIGNS AND SYMPTOMS OF STDs? °Different STDs have different symptoms. Some people may not have any symptoms. If symptoms are present, they may include: °· Painful or bloody urination. °· Pain in the pelvis, abdomen, vagina, anus, throat, or eyes. °· A skin rash, itching, or irritation. °· Growths, ulcerations, blisters, or sores in the genital and anal areas. °· Abnormal vaginal discharge with or without bad odor. °· Penile discharge in men. °· Fever. °· Pain or bleeding during sexual intercourse. °· Swollen glands in the groin area. °· Yellow skin and eyes (jaundice). This is seen with hepatitis. °· Swollen testicles. °· Infertility. °· Sores and blisters in the mouth. °HOW ARE STDs DIAGNOSED? °To make a diagnosis, your health care provider may: °· Take a medical history. °· Perform a physical exam. °· Take a sample of any discharge to examine. °· Swab the throat,  cervix, opening to the penis, rectum, or vagina for testing. °· Test a sample of your first morning urine. °· Perform blood tests. °· Perform a Pap test, if this applies. °· Perform a colposcopy. °· Perform a laparoscopy. °HOW ARE STDs TREATED? °Treatment depends on the STD. Some STDs may be treated but not cured. °· Chlamydia, gonorrhea, trichomonas, and syphilis can be cured with antibiotic medicine. °· Genital herpes, hepatitis, and HIV can be treated, but not cured, with prescribed medicines. The medicines lessen symptoms. °· Genital warts from HPV can be treated with medicine or by freezing, burning (electrocautery), or surgery. Warts may come back. °· HPV cannot be cured with medicine or surgery. However, abnormal areas may be removed from the cervix, vagina, or vulva. °· If your diagnosis is confirmed, your recent sexual partners need treatment. This is true even if they are symptom-free or have a negative culture or evaluation. They should not have sex until their health care providers say it is okay. °· Your health care provider may test you for infection again 3 months after treatment. °HOW CAN I REDUCE MY RISK OF GETTING AN STD? °Take these steps to reduce your risk of getting an STD: °· Use latex condoms, dental dams, and water-soluble lubricants during sexual activity. Do not use petroleum jelly or oils. °· Avoid having multiple sex partners. °· Do not have sex with someone who has other sex partners °· Do not have sex with anyone you do not know or who is at high risk for an STD. °· Avoid risky sex practices that can break your skin. °· Do not have sex   if you have open sores on your mouth or skin. °· Avoid drinking too much alcohol or taking illegal drugs. Alcohol and drugs can affect your judgment and put you in a vulnerable position. °· Avoid engaging in oral and anal sex acts. °· Get vaccinated for HPV and hepatitis. If you have not received these vaccines in the past, talk to your health care  provider about whether one or both might be right for you. °· If you are at risk of being infected with HIV, it is recommended that you take a prescription medicine daily to prevent HIV infection. This is called pre-exposure prophylaxis (PrEP). You are considered at risk if: °¨ You are a man who has sex with other men (MSM). °¨ You are a heterosexual man or woman and are sexually active with more than one partner. °¨ You take drugs by injection. °¨ You are sexually active with a partner who has HIV. °· Talk with your health care provider about whether you are at high risk of being infected with HIV. If you choose to begin PrEP, you should first be tested for HIV. You should then be tested every 3 months for as long as you are taking PrEP. °WHAT SHOULD I DO IF I THINK I HAVE AN STD? °· See your health care provider. °· Tell your sexual partner(s). They should be tested and treated for any STDs. °· Do not have sex until your health care provider says it is okay. °WHEN SHOULD I GET IMMEDIATE MEDICAL CARE? °Contact your health care provider right away if:  °· You have severe abdominal pain. °· You are a man and notice swelling or pain in your testicles. °· You are a woman and notice swelling or pain in your vagina. °  °This information is not intended to replace advice given to you by your health care provider. Make sure you discuss any questions you have with your health care provider. °  °Document Released: 07/31/2002 Document Revised: 05/31/2014 Document Reviewed: 11/28/2012 °Elsevier Interactive Patient Education ©2016 Elsevier Inc. ° °

## 2015-10-24 LAB — GC/CHLAMYDIA PROBE AMP (~~LOC~~) NOT AT ARMC
Chlamydia: NEGATIVE
Neisseria Gonorrhea: NEGATIVE

## 2015-10-24 LAB — RPR: RPR Ser Ql: NONREACTIVE

## 2015-10-24 LAB — HIV ANTIBODY (ROUTINE TESTING W REFLEX): HIV SCREEN 4TH GENERATION: NONREACTIVE

## 2015-10-25 ENCOUNTER — Other Ambulatory Visit: Payer: Self-pay | Admitting: Obstetrics and Gynecology

## 2015-10-25 DIAGNOSIS — R102 Pelvic and perineal pain: Secondary | ICD-10-CM

## 2015-10-30 ENCOUNTER — Other Ambulatory Visit: Payer: Self-pay | Admitting: Obstetrics and Gynecology

## 2015-10-30 ENCOUNTER — Other Ambulatory Visit (HOSPITAL_COMMUNITY): Payer: Medicaid Other

## 2015-10-30 ENCOUNTER — Ambulatory Visit (HOSPITAL_COMMUNITY): Admission: RE | Admit: 2015-10-30 | Payer: Medicaid Other | Source: Ambulatory Visit

## 2015-10-30 DIAGNOSIS — R102 Pelvic and perineal pain: Secondary | ICD-10-CM

## 2015-11-07 ENCOUNTER — Other Ambulatory Visit: Payer: Self-pay | Admitting: Family Medicine

## 2015-11-07 MED FILL — LOSARTAN POTASSIUM 50 MG TA: 50 | 30 days supply | Qty: 30 | Fill #0

## 2015-11-07 MED FILL — METOPROLOL SUCC ER 50 MG TA: 50 | 30 days supply | Qty: 30 | Fill #1

## 2015-11-07 MED FILL — ?SIMVASTATIN 10 MG TABLET: 10 | 30 days supply | Qty: 30 | Fill #0

## 2015-11-07 MED FILL — ?FUROSEMIDE 40 MG TABLET: 40 | 30 days supply | Qty: 30 | Fill #5

## 2015-11-07 MED FILL — POTASSIUM CL ER 20 MEQ TAB: 20 | 30 days supply | Qty: 60 | Fill #5

## 2015-12-05 ENCOUNTER — Other Ambulatory Visit: Payer: Self-pay | Admitting: Family Medicine

## 2015-12-05 MED FILL — METOPROLOL SUCC ER 50 MG TA: 50 | 30 days supply | Qty: 30 | Fill #2

## 2015-12-05 MED FILL — POTASSIUM CL ER 20 MEQ TAB: 20 | 30 days supply | Qty: 60 | Fill #6

## 2015-12-08 ENCOUNTER — Ambulatory Visit (HOSPITAL_COMMUNITY)
Admission: RE | Admit: 2015-12-08 | Discharge: 2015-12-08 | Disposition: A | Discharge status: Home / Self Care | Payer: Self-pay | Source: Ambulatory Visit | Attending: Obstetrics and Gynecology | Admitting: Obstetrics and Gynecology

## 2015-12-08 DIAGNOSIS — D259 Leiomyoma of uterus, unspecified: Secondary | ICD-10-CM | POA: Insufficient documentation

## 2015-12-08 DIAGNOSIS — R102 Pelvic and perineal pain: Secondary | ICD-10-CM | POA: Insufficient documentation

## 2015-12-08 DIAGNOSIS — Z975 Presence of (intrauterine) contraceptive device: Secondary | ICD-10-CM | POA: Insufficient documentation

## 2015-12-10 ENCOUNTER — Ambulatory Visit (INDEPENDENT_AMBULATORY_CARE_PROVIDER_SITE_OTHER): Payer: Self-pay | Admitting: Internal Medicine

## 2015-12-10 ENCOUNTER — Encounter (INDEPENDENT_AMBULATORY_CARE_PROVIDER_SITE_OTHER): Payer: Self-pay

## 2015-12-10 ENCOUNTER — Other Ambulatory Visit: Payer: Self-pay | Admitting: Family Medicine

## 2015-12-10 ENCOUNTER — Encounter: Payer: Self-pay | Admitting: Internal Medicine

## 2015-12-10 VITALS — BP 142/78 | HR 96 | Ht 68.0 in | Wt 190.2 lb

## 2015-12-10 DIAGNOSIS — I428 Other cardiomyopathies: Secondary | ICD-10-CM

## 2015-12-10 DIAGNOSIS — I429 Cardiomyopathy, unspecified: Secondary | ICD-10-CM

## 2015-12-10 DIAGNOSIS — I5042 Chronic combined systolic (congestive) and diastolic (congestive) heart failure: Secondary | ICD-10-CM

## 2015-12-10 MED ORDER — SIMVASTATIN 10 MG PO TABS: ORAL_TABLET | ORAL | Status: DC

## 2015-12-10 MED ORDER — FUROSEMIDE 40 MG PO TABS: ORAL_TABLET | ORAL | Status: DC

## 2015-12-10 MED ORDER — LOSARTAN POTASSIUM 50 MG PO TABS: ORAL_TABLET | ORAL | Status: DC

## 2015-12-10 MED FILL — FUROSEMIDE 40 MG TABLET: 40 | 30 days supply | Qty: 75 | Fill #0

## 2015-12-10 MED FILL — ?SIMVASTATIN 10 MG TABLET: 10 | 30 days supply | Qty: 30 | Fill #0

## 2015-12-10 MED FILL — LOSARTAN POTASSIUM 50 MG TA: 50 | 30 days supply | Qty: 30 | Fill #0

## 2015-12-10 NOTE — Progress Notes (Signed)
Patient Care Team: Boykin Nearing, MD as PCP - General (Family Medicine) Deboraha Sprang, MD as PCP - Cardiology (Cardiology)   HPI  Chelsea Branch is a 50 y.o. 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 She had recovery of LV function most recently in 2008. 2011 left ventricular function had decreased to 25%.  She has significant hypertension.   She was rehospitalized 3/15 for acute on chronic congestive heart failure. She was treated with IV diuresis. Echocardiogram demonstrated EF 25-30%.   . Echocardiogram 8/15 demonstrated normalization of LV systolic function  Hospitalized again 1/17 with congestive heart failure. Echocardiogram EF 25-30% LHC-no obstructive coronary disease EF 25%   Seen LG a couple of times since then.  Now with some PND and orthopnea without edema  Has run out of meds  Had stopped smoking but resumed last week     Past Medical History  Diagnosis Date  . 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 Peripartum cardiomyopathy 1994  . Nonischemic cardiomyopathy ([place]) 1994; 2017    a. iniatially ?2/2 peripartum in 1994 - improved by 2008 then worsening EF in 2011 back down to EF 25-30%. b. echo 01/21/14 showed mod LVH, [name]; [name], [name], [name],   . Angina pectoris with normal coronary arteriogram ([place]) 2017    Had + Troponin c/w ? NSTEMI due to A on C CHF  . NSTEMI (non-ST elevated myocardial infarction) ([place]) 05/2015    Normal Coronaries.  . Fibroids   . Abnormal uterine bleeding   . Menorrhagia   . DYSLIPIDEMIA   . Chronic combined systolic and diastolic CHF (congestive heart failure) ([place])   . Hypertension   . Stroke [facility]) 2011  . Sleep apnea 2015    CPAP 12/2013  . Fibroids   . Anemia   . H/O noncompliance with medical treatment, [name]     Past Surgical History  Procedure Laterality Date  . Loop recorder implant   ~ 2000  . Tibial tuberclerplasty    . Cesarean section  1992  1994  . Tubal ligation  1994  . Cardiac catheterization N/A 06/16/2015    Procedure: Left Heart Cath and Coronary Angiography;  Surgeon: Jettie Booze, MD;  Location: [facility];  Service: Cardiovascular;  Laterality: N/A;    Current Outpatient Prescriptions  Medication Sig Dispense Refill  . furosemide (LASIX) 40 MG tablet Take 1 tablet (40 mg total) by mouth daily. Take an additional tablet daily for weight gain of 3 lbs or more above dry weight. 60 tablet 11  . losartan (COZAAR) 50 MG tablet TAKE 1 TABLET BY MOUTH DAILY. NEEDS OFFICE VISIT FOR REFILLS 30 tablet 0  . megestrol (MEGACE) 40 MG tablet TAKE 1/2 TABLET BY MOUTH DAILY 30 tablet 0  . metoprolol succinate (TOPROL-XL) 50 MG 24 hr tablet Take 1 tablet (50 mg total) by mouth daily. 30 tablet 11  . Potassium Chloride ER 20 MEQ TBCR Take 1 tablet by mouth daily. TAKE 1 extra tablet when you take extra Furosemide (Lasix) 60 tablet 11  . simvastatin (ZOCOR) 10 MG tablet TAKE 1 TABLET BY MOUTH DAILY. NEEDS OFFICE VISIT FOR REFILLS 30 tablet 0   No current facility-administered medications for this visit.    Allergies  Allergen Reactions  . Ace Inhibitors     REACTION: Cough    Review of Systems negative  except from HPI and PMH  Physical Exam BP 142/78 mmHg  Pulse 96  Ht 5\' 8"  (1.727 m)  Wt 190 lb 3.2 oz (86.274 kg)  BMI 28.93 kg/m2  SpO2 98% Well developed and well nourished in no acute distress HENT normal E scleral and icterus clear Neck Supple JVP 6-8carotids brisk and full Clear to ausculation  *Regular rate and rhythm, no murmurs gallops or rub Soft with active bowel sounds No clubbing cyanosis  Edema Alert and oriented, grossly normal motor and sensory function Skin Warm and Dry  ECG 24 March demonstrated sinus rhythm at 72 Intervals 18/09/44 Voltage consistent with LVH Assessment and  Plan  nonischemic cardiomyopathy  Congestive  heart failure   Hypertension-resolved  Obstructive sleep apnea AHI 18     She has not been taking her diuretics. We will resume and increasing dose 40>>60   contibnue current meds  She might benefit from the addition of aldactone if BP remains elevated at next visit

## 2015-12-10 NOTE — Telephone Encounter (Signed)
Refill Cozaar

## 2015-12-10 NOTE — Patient Instructions (Signed)
Medication Instructions: - 1. Increase your lasix to 1.5 tablets (60 mg) in the AM and an additional 1 tablet (40 mg) in the PM if needed   Labwork: none  Procedures/Testing: none  Follow-Up: Your physician recommends that you schedule a follow-up appointment in 2-3 months with Richardson Dopp PA  Any Additional Special Instructions Will Be Listed Below (If Applicable).     If you need a refill on your cardiac medications before your next appointment, please call your pharmacy.

## 2015-12-15 ENCOUNTER — Encounter: Payer: Self-pay | Admitting: Obstetrics & Gynecology

## 2015-12-15 ENCOUNTER — Ambulatory Visit (INDEPENDENT_AMBULATORY_CARE_PROVIDER_SITE_OTHER): Payer: Self-pay | Admitting: Obstetrics & Gynecology

## 2015-12-15 VITALS — BP 122/76 | HR 76 | Ht 68.0 in | Wt 185.7 lb

## 2015-12-15 DIAGNOSIS — Z30433 Encounter for removal and reinsertion of intrauterine contraceptive device: Secondary | ICD-10-CM

## 2015-12-15 DIAGNOSIS — D259 Leiomyoma of uterus, unspecified: Secondary | ICD-10-CM

## 2015-12-15 DIAGNOSIS — N921 Excessive and frequent menstruation with irregular cycle: Secondary | ICD-10-CM

## 2015-12-15 DIAGNOSIS — T8389XA Other specified complication of genitourinary prosthetic devices, implants and grafts, initial encounter: Secondary | ICD-10-CM

## 2015-12-15 NOTE — Patient Instructions (Signed)
iud Intrauterine Device Information An intrauterine device (IUD) is inserted into your uterus to prevent pregnancy. There are two types of IUDs available:   Copper IUD--This type of IUD is wrapped in copper wire and is placed inside the uterus. Copper makes the uterus and fallopian tubes produce a fluid that kills sperm. The copper IUD can stay in place for 10 years.  Hormone IUD--This type of IUD contains the hormone progestin (synthetic progesterone). The hormone thickens the cervical mucus and prevents sperm from entering the uterus. It also thins the uterine lining to prevent implantation of a fertilized egg. The hormone can weaken or kill the sperm that get into the uterus. One type of hormone IUD can stay in place for 5 years, and another type can stay in place for 3 years. Your health care provider will make sure you are a good candidate for a contraceptive IUD. Discuss with your health care provider the possible side effects.  ADVANTAGES OF AN INTRAUTERINE DEVICE  IUDs are highly effective, reversible, long acting, and low maintenance.   There are no estrogen-related side effects.   An IUD can be used when breastfeeding.   IUDs are not associated with weight gain.   The copper IUD works immediately after insertion.   The hormone IUD works right away if inserted within 7 days of your period starting. You will need to use a backup method of birth control for 7 days if the hormone IUD is inserted at any other time in your cycle.  The copper IUD does not interfere with your female hormones.   The hormone IUD can make heavy menstrual periods lighter and decrease cramping.   The hormone IUD can be used for 3 or 5 years.   The copper IUD can be used for 10 years. DISADVANTAGES OF AN INTRAUTERINE DEVICE  The hormone IUD can be associated with irregular bleeding patterns.   The copper IUD can make your menstrual flow heavier and more painful.   You may experience cramping  and vaginal bleeding after insertion.    This information is not intended to replace advice given to you by your health care provider. Make sure you discuss any questions you have with your health care provider.   Document Released: 04/13/2004 Document Revised: 01/10/2013 Document Reviewed: 10/29/2012 Elsevier Interactive Patient Education [employer].

## 2015-12-15 NOTE — Progress Notes (Signed)
[name], female   DOB: 01-03-1966, 50 y.o.   MRN: [id number]  Chief Complaint  Patient presents with  . Contraception    remove and replace  stated having cramps and bleeding with Mirena in place since 12/2013, malpositioned per [place]  HPI Chelsea Branch is a 50 y.o. female.  [id number] No LMP recorded (lmp unknown). Patient is not currently having periods (Reason: IUD). Currently bleeding and pain, requests IUD be replaced today, for DUB with fibroids HPI  Past Medical History:  Diagnosis Date  . Abnormal uterine bleeding   . Alopecia   . Anemia   . Angina pectoris with normal coronary arteriogram ([place] [facility]) 2017   Had + Troponin c/w ? NSTEMI due to A on C CHF  . Collyn Selk-CHIARI MALFORMATION 06/08/2010  . Chronic combined systolic and diastolic CHF (congestive heart failure) ([place])   . DYSLIPIDEMIA   . Fibroids   . Fibroids   . H/O noncompliance with medical treatment, presenting hazards to health   . Hypertension   . Menorrhagia   . Nonischemic cardiomyopathy ([place]) 1994; 2017   a. iniatially ?2/2 peripartum in 1994 - improved by 2008 then worsening EF in 2011 back down to EF 25-30%. b. echo 01/21/14 showed mod LVH, [name]; [name], [name], [name],   . NSTEMI (non-ST elevated myocardial infarction) ([place]) 05/2015   Normal Coronaries.  . Peripartum cardiomyopathy 1994  . Sleep apnea 2015   CPAP 12/2013  . Stroke [facility]) 2011  . Tobacco abuse     Past Surgical History:  Procedure Laterality Date  . CARDIAC CATHETERIZATION N/A 06/16/2015   Procedure: Left Heart Cath and Coronary Angiography;  Surgeon: Jettie Booze, MD;  Location: [facility];  Service: Cardiovascular;  Laterality: N/A;  . [place]  . LOOP RECORDER IMPLANT  ~ 2000  . TIBIAL TUBERCLERPLASTY    . TUBAL LIGATION  1994    Family History  Problem Relation Age of Onset  . Other Neg Hx   . Heart disease Neg Hx   . Cancer Maternal Grandmother     uterine   . Hypertension Sister     Social History Social History  Substance Use Topics  . Smoking status: Former Smoker    Packs/day: 0.10    Years: 30.00    Types: Cigarettes    Quit date: 06/03/2015  . Smokeless tobacco: Never Used  . Alcohol use No    Allergies  Allergen Reactions  . Ace Inhibitors     REACTION: Cough    Current Outpatient Prescriptions  Medication Sig Dispense Refill  . furosemide (LASIX) 40 MG tablet Take 1.5 tablet (60 mg)by mouth in the AM and take an additional 1 tablet (40mg ) by mouth in the PM if necessary 75 tablet 11  . losartan (COZAAR) 50 MG tablet TAKE 1 TABLET BY MOUTH DAILY. NEEDS OFFICE VISIT FOR REFILLS 30 tablet 11  . megestrol (MEGACE) 40 MG tablet TAKE 1/2 TABLET BY MOUTH DAILY 30 tablet 0  . metoprolol succinate (TOPROL-XL) 50 MG 24 hr tablet Take 1 tablet (50 mg total) by mouth daily. 30 tablet 11  . Potassium Chloride ER 20 MEQ TBCR Take 1 tablet by mouth daily. TAKE 1 extra tablet when you take extra Furosemide (Lasix) 60 tablet 11  . simvastatin (ZOCOR) 10 MG tablet TAKE 1 TABLET BY MOUTH DAILY. NEEDS OFFICE VISIT FOR REFILLS 30 tablet 11   No current facility-administered medications for  this visit.     Review of Systems Review of Systems  Constitutional: Negative.   Gastrointestinal: Positive for abdominal pain.  Genitourinary: Positive for pelvic pain and vaginal bleeding. Negative for dysuria.    Blood pressure 122/76, pulse 76, height 5\' 8"  (1.727 m), weight 185 lb 11.2 oz (84.2 kg).  Physical Exam Physical Exam  Constitutional: She is oriented to person, place, and time. She appears well-developed. No distress.  Cardiovascular: Normal rate.   Pulmonary/Chest: Effort normal.  Genitourinary: Vagina normal. No vaginal discharge found.  Neurological: She is alert and oriented to person, place, and time.  Psychiatric: She has a normal mood and affect. Her behavior is normal.  Vitals reviewed. string was not seen , cervix prepped  after time-out and dressing forceps used to grasp and remove IUD intact  Patient identified, informed consent performed, signed copy in chart, time out was performed.  Urine pregnancy test negative.  Speculum placed in the vagina.  Cervix visualized.  Cleaned with Betadine x 2.  Grasped anteriorly with a single tooth tenaculum.  Uterus sounded to 9 cm. Liletta IUD placed per manufacturer's recommendations.  Strings trimmed to 3 cm.   Patient given post procedure instructions and Liletta care card with expiration date.  Patient is asked to check IUD strings periodically and follow up in 4-6 weeks for IUD check. Data Reviewed CLINICAL DATA:  Patient with pelvic pain. History of fibroids. Abnormal uterine bleeding.  EXAM: TRANSABDOMINAL AND TRANSVAGINAL ULTRASOUND OF PELVIS  TECHNIQUE: Both transabdominal and transvaginal ultrasound examinations of the pelvis were performed. Transabdominal technique was performed for global imaging of the pelvis including uterus, ovaries, adnexal regions, and pelvic cul-de-sac. It was necessary to proceed with endovaginal exam following the transabdominal exam to visualize the endometrium and adnexal structures.  COMPARISON:  CT 03/22/2015 ; ultrasound 05/13/2014  FINDINGS: Uterus  Measurements: 10.4 x 6.5 x 8.3 cm. Multiple uterine fibroids are demonstrated. Within the posterior aspect of the uterine body there is a 2.9 x 2.9 x 3.5 cm intramural fibroid with a submucosal component. There is a partially exophytic 4.0 x 3.8 x 3.3 cm fibroid off the right aspect of the uterine body. Multiple additional smaller intramural fibroids are demonstrated. Intrauterine device appears low lying within the lower uterine segment/cervix.  Endometrium  Thickness: 9 mm. Difficult to visualize secondary to multiple fibroids.  Right ovary  Measurements: 2.9 x 1.9 x 1.4 cm. Normal appearance/no adnexal mass.  Left ovary  Measurements: 3.1 x 1.4 x  1.7 cm. Normal appearance/no adnexal mass.  Other findings  No abnormal free fluid.  IMPRESSION: Intrauterine device appears low lying within the lower uterine segment/ endocervical canal.  Multiple fibroids as described above. At least 1 of these fibroids has a large submucosal component.  Endometrium measures 9 mm. If bleeding remains unresponsive to hormonal or medical therapy, sonohysterogram should be considered for focal lesion work-up. (Ref: Radiological Reasoning: Algorithmic Workup of Abnormal Vaginal Bleeding with Endovaginal Sonography and Sonohysterography. [id number] [id number])   Electronically Signed   By: Lovey Newcomer M.D.   On: 12/08/2015 15:33   Assessment    DUB with malpositioned IUD tolerated replacement well    Plan    Instructions given String check in 4-6 weeks       Javarius Tsosie 12/15/2015, 2:07 PM

## 2015-12-16 ENCOUNTER — Telehealth: Payer: Self-pay | Admitting: *Deleted

## 2015-12-16 DIAGNOSIS — G8918 Other acute postprocedural pain: Secondary | ICD-10-CM

## 2015-12-16 NOTE — Telephone Encounter (Signed)
Received call left on nurse line on 12/16/15 at 0754.  Patient states she was seen yesterday by Dr. Roselie Awkward for an IUD insertion.  Patient requests something for pain.  Requests a return call to [phone].

## 2015-12-24 MED ORDER — IBUPROFEN 600 MG PO TABS: 600.0000 mg | ORAL_TABLET | Freq: Four times a day (QID) | ORAL | 0 refills | Status: DC | PRN

## 2015-12-24 NOTE — Telephone Encounter (Signed)
I called Chelsea Branch and she states she is still having some pain and that Dr. Roselie Awkward was supposed to prescribe her some ibuprofen 800mg . I informed her I could send her in 600mg  po every 6 hours prn. She voiced appreciation. Before sending to pharmacy reviewed problem list- noted history aneurysm. And reviewed with Dr. Harolyn Rutherford- may have ibuprofen prn.

## 2016-01-06 ENCOUNTER — Encounter: Payer: Self-pay | Admitting: General Practice

## 2016-02-03 MED FILL — LOSARTAN POTASSIUM 50 MG TA: 50 | 30 days supply | Qty: 30 | Fill #1

## 2016-02-03 MED FILL — ?FUROSEMIDE 40 MG TABLET: 40 | 30 days supply | Qty: 75 | Fill #1

## 2016-02-03 MED FILL — POTASSIUM CL ER 20 MEQ TAB: 20 | 30 days supply | Qty: 60 | Fill #7

## 2016-02-03 MED FILL — SIMVASTATIN 10 MG TABLET: 10 | 30 days supply | Qty: 30 | Fill #1

## 2016-02-03 MED FILL — METOPROLOL SUCC ER 50 MG TA: 50 | 30 days supply | Qty: 30 | Fill #3

## 2016-02-03 MED FILL — IBUPROFEN 600 MG TABLET: 600 | 7 days supply | Qty: 30 | Fill #0

## 2016-02-13 ENCOUNTER — Encounter: Payer: Self-pay | Admitting: Nurse Practitioner

## 2016-02-23 ENCOUNTER — Emergency Department (HOSPITAL_COMMUNITY): Payer: Medicaid Other

## 2016-02-23 ENCOUNTER — Emergency Department (HOSPITAL_COMMUNITY)
Admission: EM | Admit: 2016-02-23 | Discharge: 2016-02-23 | Disposition: A | Discharge status: Home / Self Care | Payer: Medicaid Other | Attending: Emergency Medicine | Admitting: Emergency Medicine

## 2016-02-23 ENCOUNTER — Encounter (HOSPITAL_COMMUNITY): Payer: Self-pay | Admitting: Emergency Medicine

## 2016-02-23 DIAGNOSIS — I110 Hypertensive heart disease with heart failure: Secondary | ICD-10-CM | POA: Diagnosis not present

## 2016-02-23 DIAGNOSIS — J40 Bronchitis, not specified as acute or chronic: Secondary | ICD-10-CM | POA: Diagnosis not present

## 2016-02-23 DIAGNOSIS — R0602 Shortness of breath: Secondary | ICD-10-CM | POA: Diagnosis present

## 2016-02-23 DIAGNOSIS — Z8673 Personal history of transient ischemic attack (TIA), and cerebral infarction without residual deficits: Secondary | ICD-10-CM | POA: Insufficient documentation

## 2016-02-23 DIAGNOSIS — I252 Old myocardial infarction: Secondary | ICD-10-CM | POA: Diagnosis not present

## 2016-02-23 DIAGNOSIS — Z87891 Personal history of nicotine dependence: Secondary | ICD-10-CM | POA: Diagnosis not present

## 2016-02-23 DIAGNOSIS — I5042 Chronic combined systolic (congestive) and diastolic (congestive) heart failure: Secondary | ICD-10-CM | POA: Diagnosis not present

## 2016-02-23 MED ORDER — HYDROCOD POLST-CPM POLST ER 10-8 MG/5ML PO SUER: 5.0000 mL | Freq: Two times a day (BID) | ORAL | 0 refills | Status: DC | PRN

## 2016-02-23 MED ORDER — ALBUTEROL SULFATE (2.5 MG/3ML) 0.083% IN NEBU
INHALATION_SOLUTION | RESPIRATORY_TRACT | Status: AC
  Filled 2016-02-23: qty 3

## 2016-02-23 MED ORDER — ALBUTEROL SULFATE (2.5 MG/3ML) 0.083% IN NEBU
5.0000 mg | INHALATION_SOLUTION | Freq: Once | RESPIRATORY_TRACT | Status: AC
  Administered 2016-02-23: 5 mg via RESPIRATORY_TRACT

## 2016-02-23 MED ORDER — ALBUTEROL SULFATE HFA 108 (90 BASE) MCG/ACT IN AERS
2.0000 | INHALATION_SPRAY | Freq: Once | RESPIRATORY_TRACT | Status: AC
  Administered 2016-02-23: 2 via RESPIRATORY_TRACT
  Filled 2016-02-23: qty 6.7

## 2016-02-23 NOTE — ED Triage Notes (Signed)
Pt comes in to ED w/ c/o SOB, and cough x1 wk. Pt has taken OTC meds w/ no relief. Chest expansion symmetrical, wheezing noted bilaterally. Pt states sputum is yellow/green and thick. Pt has hx of Pneumonia. Pt AOx4.

## 2016-02-23 NOTE — ED Notes (Signed)
Gave patient a turkey sandwich 

## 2016-02-23 NOTE — ED Provider Notes (Signed)
[facility] DEPT Provider Note   CSN: [id number] Arrival date & time: 02/23/16  0856     History   Chief Complaint Chief Complaint  Patient presents with  . Shortness of Breath    HPI Chelsea Branch is a 50 y.o. female.  HPI 50 year old female presents with cough and congestion. She states she started having congestion and body aches starting 5 days ago. Her boyfriend recently had a cold she thinks she caught. Has been having some dyspnea with cough and wheezes. Her chest has felt tight. She has a history of congestive heart failure but denies any chest pain or leg swelling. Coughing gets worse when lying flat. Has not had fevers but has felt some intermittent arthralgias. Given an albuterol nebulizer prior to me seeing the patient and she states she feels much better.  Past Medical History:  Diagnosis Date  . Abnormal uterine bleeding   . Alopecia   . Anemia   . Angina pectoris with normal coronary arteriogram ([place]) 2017   Had + Troponin c/w ? NSTEMI due to A on C CHF  . 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 Chronic combined systolic and diastolic CHF (congestive heart failure) ([place])   . DYSLIPIDEMIA   . Fibroids   . Fibroids   . H/O noncompliance with medical treatment, presenting hazards to health   . Hypertension   . Menorrhagia   . 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 iniatially ?2/2 peripartum in 1994 - improved by 2008 then worsening EF in 2011 back down to EF 25-30%. b. echo 01/21/14 showed mod LVH, [name]; [name], [name], [name],   . NSTEMI (non-ST elevated myocardial infarction) ([place]) 05/2015   Normal Coronaries.  . Peripartum cardiomyopathy 1994  . Sleep apnea 2015   CPAP 12/2013  . Stroke [facility]) 2011  . Tobacco abuse     Patient Active Problem List   Diagnosis Date Noted  . Nonischemic dilated cardiomyopathy ([place]) 06/17/2015  . NSTEMI (non-ST elevated myocardial infarction) ([place]) - with normal cornaries on Cath  06/14/2015  . Acute on chronic combined systolic (congestive) and diastolic (congestive) heart failure 06/14/2015  . Accelerated hypertension 06/14/2015  . H/O noncompliance with medical treatment, presenting hazards to health   . Low back pain 09/16/2014  . Homelessness 09/16/2014  . Vitamin D deficiency 01/29/2014  . OSA (obstructive sleep apnea) 01/08/2014  . Chronic combined systolic and diastolic CHF (congestive heart failure) ([place]) 08/14/2013  . Essential hypertension   . Fibroids 08/09/2012  . Menorrhagia 05/08/2012  . Alopecia   . HLD (hyperlipidemia) 06/08/2010  . CEREBRAL ANEURYSM 06/08/2010  . 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 CEREBROVASCULAR ACCIDENT, HX OF 06/08/2010    Past Surgical History:  Procedure Laterality Date  . CARDIAC CATHETERIZATION N/A 06/16/2015   Procedure: Left Heart Cath and Coronary Angiography;  Surgeon: Jettie Booze, MD;  Location: [facility];  Service: Cardiovascular;  Laterality: N/A;  . [place]  . LOOP RECORDER IMPLANT  ~ 2000  . TIBIAL TUBERCLERPLASTY    . TUBAL LIGATION  1994    OB History    Gravida Para Term Preterm AB Living   2 2 2     3    SAB TAB Ectopic Multiple Live Births         1 3       Home Medications    Prior to Admission medications   Medication Sig Start Date End Date Taking?  Authorizing Provider  chlorpheniramine-HYDROcodone (TUSSIONEX PENNKINETIC ER) 10-8 MG/5ML SUER Take 5 mLs by mouth every 12 (twelve) hours as needed for cough. 02/23/16   Sherwood Gambler, MD  furosemide (LASIX) 40 MG tablet Take 1.5 tablet (60 mg)by mouth in the AM and take an additional 1 tablet (40mg ) by mouth in the PM if necessary 12/10/15   Deboraha Sprang, MD  ibuprofen (ADVIL,MOTRIN) 600 MG tablet Take 1 tablet (600 mg total) by mouth every 6 (six) hours as needed. 12/24/15   Osborne Oman, MD  losartan (COZAAR) 50 MG tablet TAKE 1 TABLET BY MOUTH DAILY. NEEDS OFFICE VISIT FOR REFILLS 12/10/15   Deboraha Sprang, MD  megestrol (MEGACE) 40 MG tablet TAKE 1/2 TABLET BY MOUTH DAILY 06/18/15   Emily Filbert, MD  metoprolol succinate (TOPROL-XL) 50 MG 24 hr tablet Take 1 tablet (50 mg total) by mouth daily. 06/17/15   Rhonda G Barrett, PA-C  Potassium Chloride ER 20 MEQ TBCR Take 1 tablet by mouth daily. TAKE 1 extra tablet when you take extra Furosemide (Lasix) 06/17/15   Evelene Croon Barrett, PA-C  simvastatin (ZOCOR) 10 MG tablet TAKE 1 TABLET BY MOUTH DAILY. NEEDS OFFICE VISIT FOR REFILLS 12/10/15   Deboraha Sprang, MD    Family History Family History  Problem Relation Age of Onset  . Cancer Maternal Grandmother     uterine  . Hypertension Sister   . Other Neg Hx   . Heart disease Neg Hx     Social History Social History  Substance Use Topics  . Smoking status: Former Smoker    Packs/day: 0.10    Years: 30.00    Types: Cigarettes    Quit date: 06/03/2015  . Smokeless tobacco: Never Used  . Alcohol use No     Allergies   Ace inhibitors   Review of Systems Review of Systems  Constitutional: Negative for fever.  HENT: Positive for congestion.   Respiratory: Positive for cough, chest tightness and shortness of breath.   Cardiovascular: Negative for chest pain and leg swelling.  All other systems reviewed and are negative.    Physical Exam Updated Vital Signs BP (!) 161/101 (BP Location: Right Arm)   Pulse 79   Temp 98.5 F (36.9 C) (Oral)   Resp 16   Ht 5\' 6"  (1.676 m)   Wt 186 lb (84.4 kg)   SpO2 98%   BMI 30.02 kg/m   Physical Exam  Constitutional: She is oriented to person, place, and time. She appears well-developed and well-nourished. No distress.  HENT:  Head: Normocephalic and atraumatic.  Right Ear: External ear normal.  Left Ear: External ear normal.  Nose: Nose normal.  Eyes: Right eye exhibits no discharge. Left eye exhibits no discharge.  Cardiovascular: Normal rate, regular rhythm and normal heart sounds.   Pulmonary/Chest: Effort normal. She has wheezes  (diffuse, mild expiratory wheezes).  Abdominal: Soft. There is no tenderness.  Musculoskeletal: She exhibits no edema.  Neurological: She is alert and oriented to person, place, and time.  Skin: Skin is warm and dry. She is not diaphoretic.  Nursing note and vitals reviewed.    ED Treatments / Results  Labs (all labs ordered are listed, but only abnormal results are displayed) Labs Reviewed - No data to display  EKG  EKG Interpretation  Date/Time:  Monday February 23 2016 09:46:28 EDT Ventricular Rate:  70 PR Interval:  178 QRS Duration: 96 QT Interval:  432 QTC Calculation: 466 R Axis:  21 Text Interpretation:  Normal sinus rhythm ST & T wave abnormality, consider inferolateral ischemia Prolonged QT Abnormal ECG no significant change since Jan 2017 Confirmed by Regenia Skeeter MD, [place] [phone]) on 02/23/2016 11:55:40 AM       Radiology Dg Chest 2 View  Result Date: 02/23/2016 CLINICAL DATA:  Productive cough, shortness of breath for 1 week EXAM: CHEST  2 VIEW COMPARISON:  06/14/2015 FINDINGS: The heart size and mediastinal contours are within normal limits. Both lungs are clear. The visualized skeletal structures are unremarkable. IMPRESSION: No active cardiopulmonary disease. Electronically Signed   By: Kathreen Devoid   On: 02/23/2016 10:52    Procedures Procedures (including critical care time)  Medications Ordered in ED Medications  albuterol (PROVENTIL) (2.5 MG/3ML) 0.083% nebulizer solution (not administered)  albuterol (PROVENTIL) (2.5 MG/3ML) 0.083% nebulizer solution (not administered)  albuterol (PROVENTIL) (2.5 MG/3ML) 0.083% nebulizer solution 5 mg (5 mg Nebulization Given 02/23/16 1001)  albuterol (PROVENTIL HFA;VENTOLIN HFA) 108 (90 Base) MCG/ACT inhaler 2 puff (2 puffs Inhalation Given 02/23/16 1237)     Initial Impression / Assessment and Plan / ED Course  I have reviewed the triage vital signs and the nursing notes.  Pertinent labs & imaging results that were  available during my care of the patient were reviewed by me and considered in my medical decision making (see chart for details).  Clinical Course    Patient overall appears well and likely has a viral bronchitis. She denies a known history of asthma or COPD. She is a former smoker. Currently mild dyspnea with some wheezes, given albuterol inhaler here to take at home. She also will be given something for cough. However no distress at this time and I highly doubt heart failure or ACS. Discharge home with return precautions and follow-up with PCP.  Final Clinical Impressions(s) / ED Diagnoses   Final diagnoses:  Bronchitis    New Prescriptions Discharge Medication List as of 02/23/2016 12:20 PM    START taking these medications   Details  chlorpheniramine-HYDROcodone (TUSSIONEX PENNKINETIC ER) 10-8 MG/5ML SUER Take 5 mLs by mouth every 12 (twelve) hours as needed for cough., Starting Mon 02/23/2016, Print         Sherwood Gambler, MD 02/23/16 1350

## 2016-02-23 NOTE — ED Notes (Signed)
Placed patient into a gown and on the monitor waiting for provider 

## 2016-03-01 NOTE — Progress Notes (Deleted)
CARDIOLOGY OFFICE NOTE  Date:  03/02/2016    Chelsea Branch Date of Birth: 1965-07-07 Medical Record [id number]  PCP:  Minerva Ends, MD  Cardiologist:  Ree Shay    No chief complaint on file.   History of Present Illness: Chelsea Branch is a 51 y.o. female who presents today for a 3 month check. Seen for Dr. Caryl Comes.   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   She presented to the hospital earlier this year with increasing SOB, DOE, orthopnea and chest burning. Admitted for further evaluation. Diuresed with IV Lasix. BP elevated - she was not taking her medicine. Ruled in for NSTEMI. EF 25 to 30% with diffuse hypokinesis. She underwent cardiac cath - no CAD but did confirm the low EF - felt to be due to HTN.   I saw her back several times - EKG remained abnormal - Dr. Caryl Comes and I elected to just follow. She tuned up pretty nicely.   Saw Dr. Caryl Comes back in July - out of medicines. Back smoking.   Comes in today. Here alone.   Past Medical History:  Diagnosis Date  . Abnormal uterine bleeding   . Alopecia   . Anemia   . Angina pectoris with normal coronary arteriogram ([place]) 2017   Had + Troponin c/w ? NSTEMI due to A on C CHF  . 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 Chronic combined systolic and diastolic CHF (congestive heart failure) ([place])   . DYSLIPIDEMIA   . Fibroids   . Fibroids   . H/O noncompliance with medical treatment, presenting hazards to health   . Hypertension   . Menorrhagia   . 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 iniatially ?2/2 peripartum in 1994 - improved by 2008 then worsening EF in 2011 back down to EF 25-30%. b. echo 01/21/14 showed mod LVH, [name]; [name], [name], [name],   . NSTEMI (non-ST elevated myocardial infarction) ([place]) 05/2015   Normal  Coronaries.  . Peripartum cardiomyopathy 1994  . Sleep apnea 2015   CPAP 12/2013  . Stroke [facility]) 2011  . Tobacco abuse     Past Surgical History:  Procedure Laterality Date  . CARDIAC CATHETERIZATION N/A 06/16/2015   Procedure: Left Heart Cath and Coronary Angiography;  Surgeon: Jettie Booze, MD;  Location: [facility];  Service: Cardiovascular;  Laterality: N/A;  . [place]  . LOOP RECORDER IMPLANT  ~ 2000  . TIBIAL TUBERCLERPLASTY    . TUBAL LIGATION  1994     Medications: Current Outpatient Prescriptions  Medication Sig Dispense Refill  . chlorpheniramine-HYDROcodone (TUSSIONEX PENNKINETIC ER) 10-8 MG/5ML SUER Take 5 mLs by mouth every 12 (twelve) hours as needed for cough. 115 mL 0  . furosemide (LASIX) 40 MG tablet Take 1.5 tablet (60 mg)by mouth in the AM and take an additional 1 tablet (40mg ) by mouth in the PM if necessary 75 tablet 11  . ibuprofen (ADVIL,MOTRIN) 600 MG tablet Take 1 tablet (600 mg total) by mouth every 6 (six) hours as needed. 30 tablet 0  . losartan (COZAAR) 50 MG tablet TAKE 1 TABLET BY MOUTH DAILY. NEEDS OFFICE VISIT FOR REFILLS 30 tablet 11  . megestrol (MEGACE) 40 MG tablet TAKE 1/2 TABLET BY MOUTH DAILY 30 tablet 0  . metoprolol succinate (TOPROL-XL) 50  MG 24 hr tablet Take 1 tablet (50 mg total) by mouth daily. 30 tablet 11  . Potassium Chloride ER 20 MEQ TBCR Take 1 tablet by mouth daily. TAKE 1 extra tablet when you take extra Furosemide (Lasix) 60 tablet 11  . simvastatin (ZOCOR) 10 MG tablet TAKE 1 TABLET BY MOUTH DAILY. NEEDS OFFICE VISIT FOR REFILLS 30 tablet 11   No current facility-administered medications for this visit.     Allergies: Allergies  Allergen Reactions  . Ace Inhibitors     REACTION: Cough    Social History: The patient  reports that she quit smoking about 8 months ago. Her smoking use included Cigarettes. She has a 3.00 pack-year smoking history. She has never used smokeless tobacco. She  reports that she does not drink alcohol or use drugs.   Family History: The patient's family history includes Cancer in her maternal grandmother; Hypertension in her sister.   Review of Systems: Please see the history of present illness.   Otherwise, the review of systems is positive for none.   All other systems are reviewed and negative.   Physical Exam: VS:  There were no vitals taken for this visit. Mar[place] Kitchen  BMI There is no height or weight on file to calculate BMI.  Wt Readings from Last 3 Encounters:  02/23/16 186 lb (84.4 kg)  12/15/15 185 lb 11.2 oz (84.2 kg)  12/10/15 190 lb 3.2 oz (86.3 kg)    General: Pleasant. Well developed, well nourished and in no acute distress.   HEENT: Normal.  Neck: Supple, no JVD, carotid bruits, or masses noted.  Cardiac: Regular rate and rhythm. No murmurs, rubs, or gallops. No edema.  Respiratory:  Lungs are clear to auscultation bilaterally with normal work of breathing.  GI: Soft and nontender.  MS: No deformity or atrophy. Gait and ROM intact.  Skin: Warm and dry. Color is normal.  Neuro:  Strength and sensation are intact and no gross focal deficits noted.  Psych: Alert, appropriate and with normal affect.   LABORATORY DATA:  EKG:  EKG is not ordered today.  Lab Results  Component Value Date   WBC 8.6 10/23/2015   HGB 15.5 (H) 10/23/2015   HCT 45.0 10/23/2015   PLT 238 10/23/2015   GLUCOSE 78 06/24/2015   CHOL 135 06/15/2015   TRIG 53 06/15/2015   HDL 35 (L) 06/15/2015   LDLCALC 89 06/15/2015   ALT 27 06/14/2015   AST 34 06/14/2015   NA 138 06/24/2015   K 3.9 06/24/2015   CL 104 06/24/2015   CREATININE 0.85 06/24/2015   BUN 11 06/24/2015   CO2 27 06/24/2015   TSH 0.638 06/14/2015   INR 1.06 06/14/2015   HGBA1C 5.8 (H) 06/14/2015    BNP (last 3 results)  Recent Labs  06/14/15 0500 06/24/15 1506  BNP 858.2* 102.9*    ProBNP (last 3 results) No results for input(s): PROBNP in the last 8760 hours.   Other Studies  Reviewed Today:  Echo: 06/15/2015 - Left ventricle: The cavity size was mildly dilated. There was moderate concentric hypertrophy. Systolic function was severely reduced. The estimated ejection fraction was in the range of 25% to 30%. Severe diffuse hypokinesis. Although no diagnostic regional wall motion abnormality was identified, this possibility cannot be completely excluded on the basis of this study. - Mitral valve: There was moderate regurgitation directed toward the free wall. - Left atrium: The atrium was moderately dilated.     Procedures    Left Heart Cath  and Coronary Angiography    Conclusion     There is severe left ventricular systolic dysfunction.  No significant CAD.  LVEDP elevated.  Nonischemic cardiomyopathy. Continue medical therapy.   [name]. Consider diuresis.      Assessment/Plan:  1. Prior cardiac cath - no significant CAD noted   2. NICM with chronic systolic HF - she is improved clinically.  Symptoms have improved. Would favor continued medicines and plan to repeat echo in about 3 months - after 09/13/15. Reminded about salt restriction and continuing to weigh daily. I will see her back afterwards.   3. Noncomliance - she seems to be back on track with taking care of herself.  4. HTN - BP has improved.  I have left her on her current regimen. It was simplified at her last admission to help with compliance.   5. Abnormal EKG - reviewed with Dr. Caryl Comes at last visit - EKG today looks a little better - she did not have significant CAD noted at time of cath. No neuro complaints today.      6. Tobacco abuse -   Current medicines are reviewed with the patient today.  The patient does not have concerns regarding medicines other than what has been noted above.  The following changes have been made:  See above.  Labs/ tests ordered today include:   No orders of the defined types were placed in this  encounter.    Disposition:   FU with me in 3 months.   Patient is agreeable to this plan and will call if any problems develop in the interim.   Signed: Burtis Junes, RN, ANP-C 03/02/2016 11:43 AM  [facility] [address] [place], [place]  [address] Phone: [phone] Fax: [phone]

## 2016-03-02 ENCOUNTER — Ambulatory Visit: Payer: Self-pay | Admitting: Nurse Practitioner

## 2016-03-02 ENCOUNTER — Ambulatory Visit: Payer: Self-pay | Admitting: Physician Assistant

## 2016-03-03 ENCOUNTER — Encounter: Payer: Self-pay | Admitting: Nurse Practitioner

## 2016-03-22 ENCOUNTER — Ambulatory Visit: Payer: Self-pay | Admitting: Nurse Practitioner

## 2016-03-22 NOTE — Progress Notes (Deleted)
CARDIOLOGY OFFICE NOTE  Date:  03/22/2016    Chelsea Branch Date of Birth: October 31, 1965 Medical Record [id number]  PCP:  Minerva Ends, MD  Cardiologist:  Ree Shay   No chief complaint on file.   History of Present Illness: Chelsea Branch is a 50 y.o. female who presents today for a follow up visit. Seen for Dr. Caryl Comes.   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   She presented to the hospital early this year with increasing SOB, DOE, orthopnea and chest burning. Admitted for further evaluation. Diuresed with IV Lasix. BP elevated - she was not taking her medicine. Ruled in for NSTEMI. EF 25 to 30% with diffuse hypokinesis. She underwent cardiac cath - no CAD but did confirm the low EF - felt to be due to HTN.   I saw her back several times - did well initially. EKG however was abnormal with diffuse T wave changes and was different from prior tracing - not really clear why - discussed with Dr. Caryl Comes and we elected to just follow. Repeat tracing looked better.   I last saw her in February. Saw Dr. Caryl Comes in July - was back smoking. Ran out of her medicines - these were restarted and diuretic was increased. Does not look like she got her repeat echo from April to recheck her EF.   Comes in today. Here alone.   Past Medical History:  Diagnosis Date  . Abnormal uterine bleeding   . Alopecia   . Anemia   . Angina pectoris with normal coronary arteriogram ([place]) 2017   Had + Troponin c/w ? NSTEMI due to A on C CHF  . 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 Chronic combined systolic and diastolic CHF (congestive heart failure) ([place])   . DYSLIPIDEMIA   . Fibroids   . Fibroids   . H/O noncompliance with medical treatment, presenting hazards to health   . Hypertension   . Menorrhagia   . Nonischemic cardiomyopathy ([place]) 1994; 2017    a. iniatially ?2/2 peripartum in 1994 - improved by 2008 then worsening EF in 2011 back down to EF 25-30%. b. echo 01/21/14 showed mod LVH, [name]; [name], [name], [name],   . NSTEMI (non-ST elevated myocardial infarction) ([place]) 05/2015   Normal Coronaries.  . Peripartum cardiomyopathy 1994  . Sleep apnea 2015   CPAP 12/2013  . Stroke [facility]) 2011  . Tobacco abuse     Past Surgical History:  Procedure Laterality Date  . CARDIAC CATHETERIZATION N/A 06/16/2015   Procedure: Left Heart Cath and Coronary Angiography;  Surgeon: Jettie Booze, MD;  Location: [facility];  Service: Cardiovascular;  Laterality: N/A;  . [place]  . LOOP RECORDER IMPLANT  ~ 2000  . TIBIAL TUBERCLERPLASTY    . TUBAL LIGATION  1994     Medications: Current Outpatient Prescriptions  Medication Sig Dispense Refill  . chlorpheniramine-HYDROcodone (TUSSIONEX PENNKINETIC ER) 10-8 MG/5ML SUER Take 5 mLs by mouth every 12 (twelve) hours as needed for cough. 115 mL 0  . furosemide (LASIX) 40 MG tablet Take 1.5 tablet (60 mg)by mouth in the AM and take an additional 1 tablet (40mg ) by mouth in the PM if necessary 75 tablet 11  . ibuprofen (ADVIL,MOTRIN) 600 MG tablet Take 1 tablet (600 mg total) by mouth every  6 (six) hours as needed. 30 tablet 0  . losartan (COZAAR) 50 MG tablet TAKE 1 TABLET BY MOUTH DAILY. NEEDS OFFICE VISIT FOR REFILLS 30 tablet 11  . megestrol (MEGACE) 40 MG tablet TAKE 1/2 TABLET BY MOUTH DAILY 30 tablet 0  . metoprolol succinate (TOPROL-XL) 50 MG 24 hr tablet Take 1 tablet (50 mg total) by mouth daily. 30 tablet 11  . Potassium Chloride ER 20 MEQ TBCR Take 1 tablet by mouth daily. TAKE 1 extra tablet when you take extra Furosemide (Lasix) 60 tablet 11  . simvastatin (ZOCOR) 10 MG tablet TAKE 1 TABLET BY MOUTH DAILY. NEEDS OFFICE VISIT FOR REFILLS 30 tablet 11   No current facility-administered medications for this visit.      Allergies: Allergies  Allergen Reactions  . Ace Inhibitors     REACTION: Cough    Social History: The patient  reports that she quit smoking about 9 months ago. Her smoking use included Cigarettes. She has a 3.00 pack-year smoking history. She has never used smokeless tobacco. She reports that she does not drink alcohol or use drugs.   Family History: The patient's family history includes Cancer in her maternal grandmother; Hypertension in her sister.   Review of Systems: Please see the history of present illness.   Otherwise, the review of systems is positive for none.   All other systems are reviewed and negative.   Physical Exam: VS:  There were no vitals taken for this visit. Mar[place] Kitchen  BMI There is no height or weight on file to calculate BMI.  Wt Readings from Last 3 Encounters:  02/23/16 186 lb (84.4 kg)  12/15/15 185 lb 11.2 oz (84.2 kg)  12/10/15 190 lb 3.2 oz (86.3 kg)    General: Pleasant. Well developed, well nourished and in no acute distress.   HEENT: Normal.  Neck: Supple, no JVD, carotid bruits, or masses noted.  Cardiac: Regular rate and rhythm. No murmurs, rubs, or gallops. No edema.  Respiratory:  Lungs are clear to auscultation bilaterally with normal work of breathing.  GI: Soft and nontender.  MS: No deformity or atrophy. Gait and ROM intact.  Skin: Warm and dry. Color is normal.  Neuro:  Strength and sensation are intact and no gross focal deficits noted.  Psych: Alert, appropriate and with normal affect.   LABORATORY DATA:  EKG:  EKG is not ordered today.  Lab Results  Component Value Date   WBC 8.6 10/23/2015   HGB 15.5 (H) 10/23/2015   HCT 45.0 10/23/2015   PLT 238 10/23/2015   GLUCOSE 78 06/24/2015   CHOL 135 06/15/2015   TRIG 53 06/15/2015   HDL 35 (L) 06/15/2015   LDLCALC 89 06/15/2015   ALT 27 06/14/2015   AST 34 06/14/2015   NA 138 06/24/2015   K 3.9 06/24/2015   CL 104 06/24/2015   CREATININE 0.85 06/24/2015   BUN 11 06/24/2015    CO2 27 06/24/2015   TSH 0.638 06/14/2015   INR 1.06 06/14/2015   HGBA1C 5.8 (H) 06/14/2015    BNP (last 3 results)  Recent Labs  06/14/15 0500 06/24/15 1506  BNP 858.2* 102.9*    ProBNP (last 3 results) No results for input(s): PROBNP in the last 8760 hours.   Other Studies Reviewed Today:  Echo: 06/15/2015 - Left ventricle: The cavity size was mildly dilated. There was moderate concentric hypertrophy. Systolic function was severely reduced. The estimated ejection fraction was in the range of 25% to 30%. Severe diffuse hypokinesis. Although  no diagnostic regional wall motion abnormality was identified, this possibility cannot be completely excluded on the basis of this study. - Mitral valve: There was moderate regurgitation directed toward the free wall. - Left atrium: The atrium was moderately dilated.     Procedures    Left Heart Cath and Coronary Angiography    Conclusion     There is severe left ventricular systolic dysfunction.  No significant CAD.  LVEDP elevated.  Nonischemic cardiomyopathy. Continue medical therapy.   [name]. Consider diuresis.      Assessment/Plan: 1. Prior cardiac cath - no significant CAD noted   2. NICM with chronic systolic HF -   3. Noncomliance -   4. HTN - BP has improved.  I have left her on her current regimen.  5. Abnormal EKG -      6. Tobacco abuse - she has   Current medicines are reviewed with the patient today.  The patient does not have concerns regarding medicines other than what has been noted above.  The following changes have been made:  See above.  Labs/ tests ordered today include:   No orders of the defined types were placed in this encounter.    Disposition:   FU with    Patient is agreeable to this plan and will call if any problems develop in the interim.   Signed: Burtis Junes, RN, ANP-C 03/22/2016 1:48 PM  [facility] [address] [place], [place]  [address] Phone: [phone] Fax: [phone]

## 2016-03-29 ENCOUNTER — Observation Stay (HOSPITAL_COMMUNITY)
Admission: EM | Admit: 2016-03-29 | Discharge: 2016-03-30 | Disposition: A | Discharge status: Home / Self Care | Payer: Medicaid Other | Attending: Internal Medicine | Admitting: Internal Medicine

## 2016-03-29 ENCOUNTER — Emergency Department (HOSPITAL_COMMUNITY): Payer: Medicaid Other

## 2016-03-29 ENCOUNTER — Encounter (HOSPITAL_COMMUNITY): Payer: Self-pay

## 2016-03-29 DIAGNOSIS — Z79899 Other long term (current) drug therapy: Secondary | ICD-10-CM | POA: Diagnosis not present

## 2016-03-29 DIAGNOSIS — Z9119 Patient's noncompliance with other medical treatment and regimen: Secondary | ICD-10-CM

## 2016-03-29 DIAGNOSIS — I5043 Acute on chronic combined systolic (congestive) and diastolic (congestive) heart failure: Secondary | ICD-10-CM | POA: Insufficient documentation

## 2016-03-29 DIAGNOSIS — R072 Precordial pain: Principal | ICD-10-CM | POA: Insufficient documentation

## 2016-03-29 DIAGNOSIS — Z888 Allergy status to other drugs, medicaments and biological substances status: Secondary | ICD-10-CM

## 2016-03-29 DIAGNOSIS — Z8673 Personal history of transient ischemic attack (TIA), and cerebral infarction without residual deficits: Secondary | ICD-10-CM

## 2016-03-29 DIAGNOSIS — Z7982 Long term (current) use of aspirin: Secondary | ICD-10-CM

## 2016-03-29 DIAGNOSIS — I5022 Chronic systolic (congestive) heart failure: Secondary | ICD-10-CM

## 2016-03-29 DIAGNOSIS — I252 Old myocardial infarction: Secondary | ICD-10-CM | POA: Diagnosis not present

## 2016-03-29 DIAGNOSIS — E876 Hypokalemia: Secondary | ICD-10-CM

## 2016-03-29 DIAGNOSIS — R778 Other specified abnormalities of plasma proteins: Secondary | ICD-10-CM | POA: Diagnosis present

## 2016-03-29 DIAGNOSIS — Z87891 Personal history of nicotine dependence: Secondary | ICD-10-CM | POA: Diagnosis not present

## 2016-03-29 DIAGNOSIS — R748 Abnormal levels of other serum enzymes: Secondary | ICD-10-CM | POA: Diagnosis not present

## 2016-03-29 DIAGNOSIS — F17211 Nicotine dependence, cigarettes, in remission: Secondary | ICD-10-CM

## 2016-03-29 DIAGNOSIS — R7989 Other specified abnormal findings of blood chemistry: Secondary | ICD-10-CM

## 2016-03-29 DIAGNOSIS — I420 Dilated cardiomyopathy: Secondary | ICD-10-CM

## 2016-03-29 DIAGNOSIS — I110 Hypertensive heart disease with heart failure: Secondary | ICD-10-CM | POA: Diagnosis not present

## 2016-03-29 DIAGNOSIS — Z91199 Patient's noncompliance with other medical treatment and regimen due to unspecified reason: Secondary | ICD-10-CM

## 2016-03-29 DIAGNOSIS — I5042 Chronic combined systolic (congestive) and diastolic (congestive) heart failure: Secondary | ICD-10-CM

## 2016-03-29 DIAGNOSIS — R079 Chest pain, unspecified: Secondary | ICD-10-CM

## 2016-03-29 DIAGNOSIS — I10 Essential (primary) hypertension: Secondary | ICD-10-CM | POA: Diagnosis present

## 2016-03-29 DIAGNOSIS — E785 Hyperlipidemia, unspecified: Secondary | ICD-10-CM

## 2016-03-29 LAB — CBC
HEMATOCRIT: 41.6 % (ref 36.0–46.0)
HEMOGLOBIN: 14.3 g/dL (ref 12.0–15.0)
MCH: 29.9 pg (ref 26.0–34.0)
MCHC: 34.4 g/dL (ref 30.0–36.0)
MCV: 86.8 fL (ref 78.0–100.0)
Platelets: 169 10*3/uL (ref 150–400)
RBC: 4.79 MIL/uL (ref 3.87–5.11)
RDW: 12.8 % (ref 11.5–15.5)
WBC: 8.4 10*3/uL (ref 4.0–10.5)

## 2016-03-29 LAB — BASIC METABOLIC PANEL
ANION GAP: 9 (ref 5–15)
BUN: 6 mg/dL (ref 6–20)
CALCIUM: 8.8 mg/dL — AB (ref 8.9–10.3)
CO2: 26 mmol/L (ref 22–32)
Chloride: 106 mmol/L (ref 101–111)
Creatinine, Ser: 0.66 mg/dL (ref 0.44–1.00)
Glucose, Bld: 101 mg/dL — ABNORMAL HIGH (ref 65–99)
POTASSIUM: 3.1 mmol/L — AB (ref 3.5–5.1)
Sodium: 141 mmol/L (ref 135–145)

## 2016-03-29 LAB — I-STAT TROPONIN, ED: TROPONIN I, POC: 0.71 ng/mL — AB (ref 0.00–0.08)

## 2016-03-29 LAB — TROPONIN I
TROPONIN I: 1.08 ng/mL — AB (ref <0.03)
TROPONIN I: 1.18 ng/mL — AB (ref <0.03)
Troponin I: 1.25 ng/mL (ref <0.03)

## 2016-03-29 LAB — BRAIN NATRIURETIC PEPTIDE: B NATRIURETIC PEPTIDE 5: 505.1 pg/mL — AB (ref 0.0–100.0)

## 2016-03-29 MED ORDER — FUROSEMIDE 20 MG PO TABS
60.0000 mg | ORAL_TABLET | Freq: Once | ORAL | Status: AC
  Administered 2016-03-29: 60 mg via ORAL
  Filled 2016-03-29: qty 3

## 2016-03-29 MED ORDER — SODIUM CHLORIDE 0.9% FLUSH: 3.0000 mL | INTRAVENOUS | Status: DC | PRN

## 2016-03-29 MED ORDER — INFLUENZA VAC SPLIT QUAD 0.5 ML IM SUSY
0.5000 mL | PREFILLED_SYRINGE | INTRAMUSCULAR | Status: AC | PRN
  Administered 2016-03-30: 0.5 mL via INTRAMUSCULAR

## 2016-03-29 MED ORDER — PNEUMOCOCCAL VAC POLYVALENT 25 MCG/0.5ML IJ INJ
0.5000 mL | INJECTION | INTRAMUSCULAR | Status: AC
  Administered 2016-03-30: 0.5 mL via INTRAMUSCULAR
  Filled 2016-03-29: qty 0.5

## 2016-03-29 MED ORDER — METOPROLOL SUCCINATE ER 50 MG PO TB24
50.0000 mg | ORAL_TABLET | Freq: Every day | ORAL | Status: DC
  Administered 2016-03-30: 50 mg via ORAL
  Filled 2016-03-29: qty 1

## 2016-03-29 MED ORDER — ACETAMINOPHEN 500 MG PO TABS
1000.0000 mg | ORAL_TABLET | Freq: Once | ORAL | Status: AC
  Administered 2016-03-29: 1000 mg via ORAL
  Filled 2016-03-29: qty 2

## 2016-03-29 MED ORDER — KETOROLAC TROMETHAMINE 15 MG/ML IJ SOLN
15.0000 mg | Freq: Four times a day (QID) | INTRAMUSCULAR | Status: DC | PRN
  Administered 2016-03-29 – 2016-03-30 (×2): 15 mg via INTRAVENOUS
  Filled 2016-03-29 (×2): qty 1

## 2016-03-29 MED ORDER — ACETAMINOPHEN 500 MG PO TABS
1000.0000 mg | ORAL_TABLET | Freq: Four times a day (QID) | ORAL | Status: DC | PRN
  Administered 2016-03-30: 1000 mg via ORAL
  Filled 2016-03-29: qty 2

## 2016-03-29 MED ORDER — ENOXAPARIN SODIUM 40 MG/0.4ML ~~LOC~~ SOLN
40.0000 mg | SUBCUTANEOUS | Status: DC
  Administered 2016-03-29: 40 mg via SUBCUTANEOUS
  Filled 2016-03-29: qty 0.4

## 2016-03-29 MED ORDER — FUROSEMIDE 10 MG/ML IJ SOLN
40.0000 mg | Freq: Two times a day (BID) | INTRAMUSCULAR | Status: DC
  Administered 2016-03-29 – 2016-03-30 (×2): 40 mg via INTRAVENOUS
  Filled 2016-03-29 (×2): qty 4

## 2016-03-29 MED ORDER — SODIUM CHLORIDE 0.9 % IV SOLN: 250.0000 mL | INTRAVENOUS | Status: DC | PRN

## 2016-03-29 MED ORDER — ACETAMINOPHEN 325 MG PO TABS: 650.0000 mg | ORAL_TABLET | ORAL | Status: DC | PRN

## 2016-03-29 MED ORDER — GI COCKTAIL ~~LOC~~
30.0000 mL | Freq: Three times a day (TID) | ORAL | Status: DC | PRN
  Administered 2016-03-30: 30 mL via ORAL
  Filled 2016-03-29: qty 30

## 2016-03-29 MED ORDER — METOPROLOL SUCCINATE ER 25 MG PO TB24
50.0000 mg | ORAL_TABLET | Freq: Once | ORAL | Status: AC
  Administered 2016-03-29: 50 mg via ORAL
  Filled 2016-03-29: qty 2

## 2016-03-29 MED ORDER — SODIUM CHLORIDE 0.9% FLUSH
3.0000 mL | Freq: Two times a day (BID) | INTRAVENOUS | Status: DC
  Administered 2016-03-29 – 2016-03-30 (×2): 3 mL via INTRAVENOUS

## 2016-03-29 MED ORDER — ASPIRIN EC 81 MG PO TBEC
81.0000 mg | DELAYED_RELEASE_TABLET | Freq: Every day | ORAL | Status: DC
  Administered 2016-03-30: 81 mg via ORAL
  Filled 2016-03-29: qty 1

## 2016-03-29 MED ORDER — ASPIRIN 81 MG PO CHEW
324.0000 mg | CHEWABLE_TABLET | Freq: Once | ORAL | Status: AC
  Administered 2016-03-29: 324 mg via ORAL
  Filled 2016-03-29: qty 4

## 2016-03-29 MED ORDER — LOSARTAN POTASSIUM 50 MG PO TABS
50.0000 mg | ORAL_TABLET | Freq: Once | ORAL | Status: AC
  Administered 2016-03-29: 50 mg via ORAL
  Filled 2016-03-29: qty 1

## 2016-03-29 MED ORDER — LOSARTAN POTASSIUM 50 MG PO TABS
50.0000 mg | ORAL_TABLET | Freq: Every day | ORAL | Status: DC
  Administered 2016-03-29 – 2016-03-30 (×2): 50 mg via ORAL
  Filled 2016-03-29 (×2): qty 1

## 2016-03-29 MED ORDER — POTASSIUM CHLORIDE CRYS ER 20 MEQ PO TBCR
40.0000 meq | EXTENDED_RELEASE_TABLET | Freq: Once | ORAL | Status: AC
  Administered 2016-03-29: 40 meq via ORAL
  Filled 2016-03-29: qty 2

## 2016-03-29 MED ORDER — MAGNESIUM HYDROXIDE 400 MG/5ML PO SUSP
30.0000 mL | Freq: Every day | ORAL | Status: DC | PRN
  Administered 2016-03-29: 30 mL via ORAL
  Filled 2016-03-29: qty 30

## 2016-03-29 MED ORDER — SIMVASTATIN 10 MG PO TABS
10.0000 mg | ORAL_TABLET | Freq: Every day | ORAL | Status: DC
  Administered 2016-03-29: 10 mg via ORAL
  Filled 2016-03-29: qty 1

## 2016-03-29 NOTE — ED Triage Notes (Addendum)
Pt reports chest pain and left arm numbness. She reports she has pain all over her body. Pt also states she has a headache and feels short of breath during the night. Pt also states she has been out of her BP meds as well as lasix X1 week.

## 2016-03-29 NOTE — ED Notes (Signed)
EDP made aware of troponin 1.08.

## 2016-03-29 NOTE — ED Provider Notes (Signed)
[facility] DEPT Provider Note   CSN: [id number] Arrival date & time: 03/29/16  0737     History   Chief Complaint Chief Complaint  Patient presents with  . Chest Pain    HPI Chelsea Branch is a 50 y.o. female.  Patient presents with generalized body soreness, intermittent chest pain, and headaches for the past week. Has been out of her medications this week and will not be able to get them filled until tomorrow. She denies chest pain now and hasn't had any since last night. Notes that she currently has an 8/10 headache which has been intermittent for about a week. Denies recent fevers or sick contacts. Had a bad cough about a month ago which has now improved. Leg swelling is similar to baseline.   The history is provided by the patient. No language interpreter was used.  Chest Pain   This is a recurrent problem. The current episode started more than 1 week ago. The problem occurs daily. The problem has been resolved. The pain is associated with rest. The pain is present in the substernal region. The pain is mild. The pain radiates to the left arm. Exacerbated by: Laying flat. Associated symptoms include headaches, malaise/fatigue, numbness and shortness of breath. Pertinent negatives include no abdominal pain, no fever, no lower extremity edema, no vomiting and no weakness. She has tried nothing for the symptoms.  Her past medical history is significant for CHF and hypertension.  Pertinent negatives for past medical history include no CAD.  Procedure history is positive for cardiac catheterization. Past workup comments: Negative cath in January 2017.    Past Medical History:  Diagnosis Date  . Abnormal uterine bleeding   . Alopecia   . Anemia   . Angina pectoris with normal coronary arteriogram ([place]) 2017   Had + Troponin c/w ? NSTEMI due to A on C CHF  . 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 Chronic combined systolic and diastolic CHF (congestive heart failure) ([place])   .  DYSLIPIDEMIA   . Fibroids   . Fibroids   . H/O noncompliance with medical treatment, presenting hazards to health   . Hypertension   . Menorrhagia   . Nonischemic cardiomyopathy ([place]) 1994; 2017   a. iniatially ?2/2 peripartum in 1994 - improved by 2008 then worsening EF in 2011 back down to EF 25-30%. b. echo 01/21/14 showed mod LVH, [name]; [name], [name], [name],   . NSTEMI (non-ST elevated myocardial infarction) ([place]) 05/2015   Normal Coronaries.  . Peripartum cardiomyopathy 1994  . Sleep apnea 2015   CPAP 12/2013  . Stroke [facility]) 2011  . Tobacco abuse     Patient Active Problem List   Diagnosis Date Noted  . Nonischemic dilated cardiomyopathy ([place]) 06/17/2015  . NSTEMI (non-ST elevated myocardial infarction) ([place]) - with normal cornaries on Cath 06/14/2015  . Acute on chronic combined systolic (congestive) and diastolic (congestive) heart failure 06/14/2015  . Accelerated hypertension 06/14/2015  . H/O noncompliance with medical treatment, presenting hazards to health   . Low back pain 09/16/2014  . Homelessness 09/16/2014  . Vitamin D deficiency 01/29/2014  . OSA (obstructive sleep apnea) 01/08/2014  . Chronic combined systolic and diastolic CHF (congestive heart failure) ([place]) 08/14/2013  . Essential hypertension   . Fibroids 08/09/2012  . Menorrhagia 05/08/2012  . Alopecia   . HLD (hyperlipidemia) 06/08/2010  . CEREBRAL ANEURYSM 06/08/2010  . 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 CEREBROVASCULAR ACCIDENT, HX OF 06/08/2010  Past Surgical History:  Procedure Laterality Date  . CARDIAC CATHETERIZATION N/A 06/16/2015   Procedure: Left Heart Cath and Coronary Angiography;  Surgeon: Jettie Booze, MD;  Location: [facility];  Service: Cardiovascular;  Laterality: N/A;  . [place]  . LOOP RECORDER IMPLANT  ~ 2000  . TIBIAL TUBERCLERPLASTY    . TUBAL LIGATION  1994    OB History    Gravida Para Term  Preterm AB Living   2 2 2     3    SAB TAB Ectopic Multiple Live Births         1 3       Home Medications    Prior to Admission medications   Medication Sig Start Date End Date Taking? Authorizing Provider  furosemide (LASIX) 40 MG tablet Take 1.5 tablet (60 mg)by mouth in the AM and take an additional 1 tablet (40mg ) by mouth in the PM if necessary Patient taking differently: Take 40-80 mg by mouth See admin instructions. Take 60-80 MG by mouth in the AM and take an additional 1 tablet (40mg ) by mouth in the PM as needed for fluid 12/10/15  Yes Deboraha Sprang, MD  ibuprofen (ADVIL,MOTRIN) 600 MG tablet Take 1 tablet (600 mg total) by mouth every 6 (six) hours as needed. Patient taking differently: Take 600 mg by mouth every 6 (six) hours as needed for moderate pain.  12/24/15  Yes Ugonna A Anyanwu, MD  losartan (COZAAR) 50 MG tablet TAKE 1 TABLET BY MOUTH DAILY. NEEDS OFFICE VISIT FOR REFILLS Patient taking differently: Take 50 mg by mouth daily.  12/10/15  Yes Deboraha Sprang, MD  megestrol (MEGACE) 40 MG tablet TAKE 1/2 TABLET BY MOUTH DAILY Patient taking differently: TAKE 20 MG BY MOUTH AS NEEDED FOR BLEEDING 06/18/15  Yes Myra Marijo Sanes, MD  metoprolol succinate (TOPROL-XL) 50 MG 24 hr tablet Take 1 tablet (50 mg total) by mouth daily. 06/17/15  Yes Rhonda G Barrett, PA-C  Potassium Chloride ER 20 MEQ TBCR Take 1 tablet by mouth daily. TAKE 1 extra tablet when you take extra Furosemide (Lasix) Patient taking differently: Take 20 mEq by mouth 2 (two) times daily.  06/17/15  Yes Rhonda G Barrett, PA-C  simvastatin (ZOCOR) 10 MG tablet TAKE 1 TABLET BY MOUTH DAILY. NEEDS OFFICE VISIT FOR REFILLS Patient taking differently: Take 10 mg by mouth every evening.  12/10/15  Yes Deboraha Sprang, MD  chlorpheniramine-HYDROcodone [facility] ER) 10-8 MG/5ML SUER Take 5 mLs by mouth every 12 (twelve) hours as needed for cough. Patient not taking: Reported on 03/29/2016 02/23/16   Sherwood Gambler, MD     Family History Family History  Problem Relation Age of Onset  . Cancer Maternal Grandmother     uterine  . Hypertension Sister   . Other Neg Hx   . Heart disease Neg Hx     Social History Social History  Substance Use Topics  . Smoking status: Former Smoker    Packs/day: 0.10    Years: 30.00    Types: Cigarettes    Quit date: 06/03/2015  . Smokeless tobacco: Never Used  . Alcohol use No     Allergies   Ace inhibitors   Review of Systems Review of Systems  Constitutional: Positive for malaise/fatigue. Negative for fever.  HENT: Negative.   Eyes: Negative for visual disturbance.  Respiratory: Positive for shortness of breath.   Cardiovascular: Positive for chest pain. Negative for leg swelling.  Gastrointestinal:  Negative for abdominal pain and vomiting.  Genitourinary: Negative.   Musculoskeletal: Negative.   Skin: Negative.   Allergic/Immunologic: Negative for immunocompromised state.  Neurological: Positive for numbness and headaches. Negative for weakness.  Hematological: Does not bruise/bleed easily.  Psychiatric/Behavioral: Negative.      Physical Exam Updated Vital Signs BP (!) 142/106 (BP Location: Right Arm)   Pulse 96   Temp 98.7 F (37.1 C) (Oral)   Resp 18   Ht 5\' 8"  (1.727 m)   Wt 87.1 kg   SpO2 100%   BMI 29.19 kg/m   Physical Exam  Constitutional: She is oriented to person, place, and time. She appears well-developed and well-nourished. No distress.  HENT:  Head: Normocephalic and atraumatic.  Eyes: Conjunctivae and EOM are normal. Pupils are equal, round, and reactive to light. No scleral icterus.  Neck: Normal range of motion. Neck supple.  Cardiovascular: Normal rate, regular rhythm, normal heart sounds and intact distal pulses.  Exam reveals no gallop and no friction rub.   No murmur heard. Pulmonary/Chest: Effort normal and breath sounds normal. No respiratory distress. She has no wheezes. She has no rales.  Abdominal: Soft.  Bowel sounds are normal. She exhibits no distension. There is no tenderness. There is no rebound and no guarding.  Musculoskeletal:  Trace pitting edema to mid calves bilaterally  Neurological: She is alert and oriented to person, place, and time.  CN II-XII intact with no facial droop, EOMI, PERRL. No aphasia or dysarthria. 5/5 strength of shoulder and elbow flexion/extension bilaterally, normal grip strength bilaterally. 5/5 strength of hip flexion/extension and dorsiflexion/plantarflexion. Sensation intact in all extremities and equal in both arms.  Skin: Skin is warm and dry. She is not diaphoretic.  Psychiatric: She has a normal mood and affect. Her behavior is normal. Judgment and thought content normal.     ED Treatments / Results  Labs (all labs ordered are listed, but only abnormal results are displayed) Labs Reviewed  Randolm Idol, ED - Abnormal; Notable for the following:       Result Value   Troponin i, poc 0.71 (*)    All other components within normal limits  BASIC METABOLIC PANEL  CBC    EKG  EKG Interpretation  Date/Time:  Monday March 29 2016 07:40:52 EST Ventricular Rate:  99 PR Interval:  148 QRS Duration: 94 QT Interval:  370 QTC Calculation: 474 R Axis:     Text Interpretation:  Normal sinus rhythm ST-t wave abnormality No significant change since last tracing Abnormal ekg Confirmed by Carmin Muskrat  MD ([id number]) on 03/29/2016 8:33:26 AM       Radiology Dg Chest 2 View  Result Date: 03/29/2016 CLINICAL DATA:  Chest pain for 1 week.  Hypertension. EXAM: CHEST  2 VIEW COMPARISON:  February 23, 2016 FINDINGS: There is no edema or consolidation. Heart is upper normal in size with pulmonary vascularity within normal limits. No adenopathy. No bone lesions. IMPRESSION: No edema or consolidation. Electronically Signed   By: Lowella Grip III M.D.   On: 03/29/2016 08:37    Procedures Procedures (including critical care time)  Medications Ordered in  ED Medications  furosemide (LASIX) tablet 60 mg (not administered)  metoprolol succinate (TOPROL-XL) 24 hr tablet 50 mg (not administered)  losartan (COZAAR) tablet 50 mg (not administered)  acetaminophen (TYLENOL) tablet 1,000 mg (not administered)     Initial Impression / Assessment and Plan / ED Course  I have reviewed the triage vital signs and the nursing notes.  Pertinent labs & imaging results that were available during my care of the patient were reviewed by me and considered in my medical decision making (see chart for details).  Clinical Course     Patient presents with one week of multiple symptoms including intermittent chest pain, headache, orthopnea, difficulty sleeping, generalized body pain, and left arm numbness. She is overall well-appearing and breathing comfortably on room air. Mildly hypertensive but with otherwise normal vital signs. EKG is similar to prior with no acute ST-T changes compared to her prior EKGs. Chest x-ray unremarkable with no signs of pneumonia or pulmonary edema. She has a normal neurological examination with no focal deficits and she does have full and normal intact sensation to her left arm so I do not suspect CVA or intracranial hemorrhage as the cause of her headache. Initial iStat troponin elevated to 0.7, confirmed by troponin sent to lab which was found to be 1.09. She was given 324 mg of ASA at this time. Discussed with cardiology, as she has a recent clean cath in January I suspect this is more related to medication noncompliance, hypertension, mild hypervolemia. Will hold on anticoagulation given recent negative cath. She will be admitted to medicine for further management.  Final Clinical Impressions(s) / ED Diagnoses   Final diagnoses:  None    New Prescriptions New Prescriptions   No medications on file     Harlin Heys, MD 03/29/16 [place], MD 03/29/16 1526

## 2016-03-29 NOTE — ED Notes (Signed)
Admitting MD at bedside.

## 2016-03-29 NOTE — ED Notes (Signed)
Warm blanket given

## 2016-03-29 NOTE — H&P (Signed)
Date: 03/29/2016               Patient Name:  Chelsea Branch MRN: [id number]  DOB: [birth year] Age / Sex: 51 y.o., female   PCP: Chelsea Nearing, MD         Medical Service: Internal Medicine Teaching Service         Attending Physician: Dr. Lucious Groves, DO    First Contact: Dr. Jari Favre Pager: [phone]  Second Contact: Dr. Tiburcio Pea Pager: [phone]       After Hours (After 5p/  First Contact Pager: [phone]  weekends / holidays): Second Contact Pager: [phone]   Chief Complaint: chest pain  History of Present Illness: 50 year old woman with history of abnormal uterine bleeding, NICM, chronic systolic and diastolic CHF, HLD, HTN, OSA (not on CPAP), CVA, former smoker presenting with chest pain. Her chest pain started about a week ago. She was lying in bed at the time. It was an aching pain in her mid chest. It is constant. Improves with lying down. Walking to the bathroom can make the pain a little worse. Radiates to her left arm. She tried to take ibuprofen for it which provided some relief. She has some associated lightheadedness but denies diaphoresis, nausea or dyspnea. She had similar pain in January. She has four pillow orthopnea and PND.  She has a headache that is generalized. It is a pounding sensation. She says this headache is unusual for her because it is more severe than the headaches she's had before. Denies fevers or weight loss. Denies confusion or impaired alertness. Denies neck pain or stiffness. No recent head trauma. Denies illicit drugs. Her headache does wake her from her sleep. Denies vision changes.  She ran out of her medications about a week ago.   Meds:  Current Meds  Medication Sig  . furosemide (LASIX) 40 MG tablet Take 1.5 tablet (60 mg)by mouth in the AM and take an additional 1 tablet (40mg ) by mouth in the PM if necessary (Patient taking differently: Take 40-80 mg by mouth See admin instructions. Take 60-80 MG by mouth in the AM and take an additional 1  tablet (40mg ) by mouth in the PM as needed for fluid)  . ibuprofen (ADVIL,MOTRIN) 600 MG tablet Take 1 tablet (600 mg total) by mouth every 6 (six) hours as needed. (Patient taking differently: Take 600 mg by mouth every 6 (six) hours as needed for moderate pain. )  . losartan (COZAAR) 50 MG tablet TAKE 1 TABLET BY MOUTH DAILY. NEEDS OFFICE VISIT FOR REFILLS (Patient taking differently: Take 50 mg by mouth daily. )  . megestrol (MEGACE) 40 MG tablet TAKE 1/2 TABLET BY MOUTH DAILY (Patient taking differently: TAKE 20 MG BY MOUTH AS NEEDED FOR BLEEDING)  . metoprolol succinate (TOPROL-XL) 50 MG 24 hr tablet Take 1 tablet (50 mg total) by mouth daily.  . Potassium Chloride ER 20 MEQ TBCR Take 1 tablet by mouth daily. TAKE 1 extra tablet when you take extra Furosemide (Lasix) (Patient taking differently: Take 20 mEq by mouth 2 (two) times daily. )  . simvastatin (ZOCOR) 10 MG tablet TAKE 1 TABLET BY MOUTH DAILY. NEEDS OFFICE VISIT FOR REFILLS (Patient taking differently: Take 10 mg by mouth every evening. )     Allergies: Allergies as of 03/29/2016 - Review Complete 03/29/2016  Allergen Reaction Noted  . Ace inhibitors Other (See Comments) 09/07/2009   Past Medical History:  Diagnosis Date  . Abnormal uterine bleeding   . Alopecia   .  Anemia   . Angina pectoris with normal coronary arteriogram ([place]) 2017   Had + Troponin c/w ? NSTEMI due to A on C CHF  . 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 Chronic combined systolic and diastolic CHF (congestive heart failure) ([place])   . DYSLIPIDEMIA   . Fibroids   . Fibroids   . H/O noncompliance with medical treatment, presenting hazards to health   . Hypertension   . Menorrhagia   . Nonischemic cardiomyopathy ([place]) 1994; 2017   a. iniatially ?2/2 peripartum in 1994 - improved by 2008 then worsening EF in 2011 back down to EF 25-30%. b. echo 01/21/14 showed mod LVH, [name]; [name], [name], [name],   . NSTEMI (non-ST  elevated myocardial infarction) ([place]) 05/2015   Normal Coronaries.  . Peripartum cardiomyopathy 1994  . Sleep apnea 2015   CPAP 12/2013  . Stroke [facility]) 2011  . Tobacco abuse     Family History: Mother is 54 and healthy. Father passed away at unknown age. She is uncertain otherwise of any family history.  Social History:  Quit tobacco 8 months ago. 2 cigarettes a day for about 30 years. Occasionally drink alcohol. Denies illicits  Review of Systems: A complete ROS was negative except as per HPI.   Ears, nose, mouth, throat, and face: no cough Gastrointestinal: no nausea/vomiting, no abdominal pain, no constipation, no diarrhea Genitourinary: no dysuria, no hematuria Integument: no rash Hematologic/lymphatic: no bleeding/bruising, no edema Musculoskeletal: no arthralgias, no myalgias Neurological: no paresthesias, no weakness   Physical Exam: Blood pressure 135/87, pulse 80, temperature 98.6 F (37 C), resp. rate 19, height 5\' 8"  (1.727 m), weight 192 lb (87.1 kg), SpO2 98 %. General Apperance: NAD Head: Normocephalic, atraumatic Eyes: PERRL, EOMI, anicteric sclera Ears: Normal external ear canal Nose: Nares normal, septum midline, mucosa normal Throat: Lips, mucosa and tongue normal  Neck: Supple, trachea midline Back: No tenderness or bony abnormality  Lungs: Clear to auscultation bilaterally. No wheezes, rhonchi or rales. Breathing comfortably Chest Wall: Nontender, no deformity Heart: Regular rate and rhythm, no murmur/rub/gallop Abdomen: Soft, nontender, nondistended, no rebound/guarding Extremities: Normal, atraumatic, warm and well perfused, no edema Pulses: 2+ throughout Skin: No rashes or lesions Neurologic: Alert and oriented x 3. CNII-XII intact. Normal strength and sensation  EKG: Normal sinus rhythm, T wave inversion in inferior leads, V1, V4-6. Unchanged from prior EKG.  CXR: No acute abnormality.   Assessment & Plan by Problem: 50 year old woman with  history of abnormal uterine bleeding, NICM, chronic systolic and diastolic CHF, HLD, HTN, OSA (not on CPAP), CVA, former smoker presenting with chest pain.  Chest pain: She has been having chest pain for the past week. She has also been out of her medications for the past week. Similar to chest pain in January when she had cardiac cath which had no CAD. EKG with no acute ischemic changes but her troponin has increased from 0.71 to 1.08. BNP 505.1. She was previously 858.2 on admission 1/21 with BNP 102.9 on discharge. CXR with no acute abnormality. She received acetaminophen, ASA 324mg , Lasix 60mg  PO, losartan 50mg  PO and metoprolol succinate 50mg  in the ED.  -Cardiology consulted by ED -Admit to telemetry -Trend troponins -EKG in the AM -GI cocktail prn -Toradol 15mg  q6hr prn  Acute on chronic systolic and diastolic CHF: LV EF 123XX123 with severe diffuse hypokinesis on 1/22. BNP elevated today. She is noncompliant of her medications. -Repeat echo -Lasix 40mg  IV BID -Strict ins/outs, daily  weights -Restart home losartan 50mg  daily  Hypokalemia: Potassium 3.1 on admission. Repleted with 81meq PO KCl.  HLD: Continue home simvastatin  HTN: BP at goal now. -Continue home losartan  Hx CVA: -ASA 81mg  daily  FEN: HH diet VTE ppx: SubQ Lovenox Code status: Full  Dispo: Admit patient to Observation with expected length of stay less than 2 midnights.  Signed: Milagros Loll, MD 03/29/2016, 11:46 AM  Pager: [phone]

## 2016-03-29 NOTE — Consult Note (Signed)
Cardiology Consult    [name] MRN: [id number], DOB/AGE: April 07, 1966   Admit date: 03/29/2016 Date of Consult: 03/29/2016  Primary Physician: Minerva Ends, MD Reason for Consult: Chest Pain Primary Cardiologist: Dr. Caryl Comes Requesting Provider: Dr. Heber Felida  Patient Profile    Chelsea Branch is a 50 year old female with a past medical history significant for NICM (remote presumed peripartum cardiomyopathy 1990s), HFrEF (EF 25-30% 05/2015), HTN, OSA, former smoker presenting with complaints of week long history or right sided chest pain, worsening dyspnea with exertion and orthopnea. Had [place] in 05/2015 with no CAD but low EF 25-30%.   History of Present Illness    Chelsea Branch reports that over the past week she has had chest pain, worsening dyspnea with exertion and orthopnea. She states that she ran out of all of her medications a week ago. Has been unable to get them due to monetary constraints and says she gets paid tomorrow and could get them. Since running out of her medications she has had right sided chest pain. She describes it as a pressure. Also notes some left arm heaviness/numbess. Her chest pain is worst when lying flat but also occurs with exertion. Symptoms improve at rest. She took ibuprofen with some improvement in her pain. States this does feel like her episode in January though less severe this time. She has had worsening dyspnea with exertion. Says that three weeks ago she had no shortness of breath with exertion and this has progressively worsened and now can only walk 25-30 feet before becoming short of breath. She takes lasix 40 mg qam and 20 mg qhs at home normally. Does not weigh herself at home regularly. Unclear baseline weight but she weighed 192 lbs following January admission for similar issue. No nausea, vomiting, diaphoresis, lightheadedness/dizziness. Does report worsening orthopnea. Reports 2 pillow orthopnea usually but has been requiring an extra  pillow. Does note PND as well.   Past Medical History   Past Medical History:  Diagnosis Date  . Abnormal uterine bleeding   . Alopecia   . Anemia   . Angina pectoris with normal coronary arteriogram ([place]) 2017   Had + Troponin c/w ? NSTEMI due to A on C CHF  . 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 Chronic combined systolic and diastolic CHF (congestive heart failure) ([place])   . DYSLIPIDEMIA   . Fibroids   . Fibroids   . H/O noncompliance with medical treatment, presenting hazards to health   . Hypertension   . Menorrhagia   . Nonischemic cardiomyopathy ([place]) 1994; 2017   a. iniatially ?2/2 peripartum in 1994 - improved by 2008 then worsening EF in 2011 back down to EF 25-30%. b. echo 01/21/14 showed mod LVH, [name]; [name], [name], [name],   . NSTEMI (non-ST elevated myocardial infarction) ([place]) 05/2015   Normal Coronaries.  . Peripartum cardiomyopathy 1994  . Sleep apnea 2015   CPAP 12/2013  . Stroke [facility]) 2011  . Tobacco abuse     Past Surgical History:  Procedure Laterality Date  . CARDIAC CATHETERIZATION N/A 06/16/2015   Procedure: Left Heart Cath and Coronary Angiography;  Surgeon: Jettie Booze, MD;  Location: [place] CV [facility];  Service: Cardiovascular;  Laterality: N/A;  . [place]  . LOOP RECORDER IMPLANT  ~ 2000  . TIBIAL TUBERCLERPLASTY    . TUBAL LIGATION  1994     Allergies  Allergies  Allergen  Reactions  . Ace Inhibitors Other (See Comments)    REACTION: Cough    Inpatient Medications    . [START ON 03/30/2016] aspirin EC  81 mg Oral Daily  . enoxaparin (LOVENOX) injection  40 mg Subcutaneous Q24H  . furosemide  40 mg Intravenous Q12H  . losartan  50 mg Oral Daily  . potassium chloride  40 mEq Oral Once  . simvastatin  10 mg Oral q1800  . sodium chloride flush  3 mL Intravenous Q12H    Family History    Family History  Problem Relation Age of Onset  . Cancer Maternal Grandmother      uterine  . Hypertension Sister   . Other Neg Hx   . Heart disease Neg Hx     Social History    Social History   Social History  . Marital status: Single    Spouse name: N/A  . Number of children: 3  . Years of education: 12   Occupational History  . unemployed    Social History Main Topics  . Smoking status: Former Smoker    Packs/day: 0.10    Years: 30.00    Types: Cigarettes    Quit date: 06/03/2015  . Smokeless tobacco: Never Used  . Alcohol use No  . Drug use: No  . Sexual activity: Yes    Birth control/ protection: IUD   Other Topics Concern  . Not on file   Social History Narrative   Lives at home with 51 yo twins ([place] and [place])   102 yo son lives outside the home   76 yo granddaughter      Review of Systems    General:  No chills, fever, night sweats or weight changes.  Cardiovascular:  No chest pain, dyspnea on exertion, edema, orthopnea, palpitations, paroxysmal nocturnal dyspnea. Dermatological: No rash, lesions/masses Respiratory: No cough, dyspnea Urologic: No hematuria, dysuria Abdominal:   No nausea, vomiting, diarrhea, bright red blood per rectum, melena, or hematemesis Neurologic:  No visual changes, wkns, changes in mental status. All other systems reviewed and are otherwise negative except as noted above.  Physical Exam    Blood pressure 129/87, pulse 80, temperature 99 F (37.2 C), temperature source Oral, resp. rate (!) 21, height 5\' 8"  (1.727 m), weight 192 lb (87.1 kg), SpO2 100 %.  General: Pleasant, NAD Psych: Normal affect. Neuro: Alert and oriented X 3. Moves all extremities spontaneously. HEENT: Normal  Neck: Supple without bruits or JVD. Lungs:  Resp regular and unlabored, CTA. Heart: RRR no s3, s4, or murmurs. Abdomen: Soft, non-tender, non-distended, BS + x 4.  Extremities: trace pedal edema bilaterally. DP/PT/Radials 2+ and equal bilaterally.  Labs    Troponin [facility] Test)  Recent Labs  03/29/16 0828    TROPIPOC 0.71*    Recent Labs  03/29/16 0911  TROPONINI 1.08*   Lab Results  Component Value Date   WBC 8.4 03/29/2016   HGB 14.3 03/29/2016   HCT 41.6 03/29/2016   MCV 86.8 03/29/2016   PLT 169 03/29/2016    Recent Labs Lab 03/29/16 0749  NA 141  K 3.1*  CL 106  CO2 26  BUN 6  CREATININE 0.66  CALCIUM 8.8*  GLUCOSE 101*   Lab Results  Component Value Date   CHOL 135 06/15/2015   HDL 35 (L) 06/15/2015   LDLCALC 89 06/15/2015   TRIG 53 06/15/2015   No results found for: [facility]   Radiology Studies    Dg Chest 2  View  Result Date: 03/29/2016 CLINICAL DATA:  Chest pain for 1 week.  Hypertension. EXAM: CHEST  2 VIEW COMPARISON:  February 23, 2016 FINDINGS: There is no edema or consolidation. Heart is upper normal in size with pulmonary vascularity within normal limits. No adenopathy. No bone lesions. IMPRESSION: No edema or consolidation. Electronically Signed   By: Lowella Grip III M.D.   On: 03/29/2016 08:37    EKG & Cardiac Imaging    EKG:   Echocardiogram:   Assessment & Plan    Chest pain: Right sided chest pressure with left arm numbness and heaviness. Has been out of her medications for the past week. Recent LHC in January 2017 with no evidence of CAD. Troponin elevated to 1.08>1.18 today. EKG with T wave inversions in lateral leads, unchanged from prior EKGs. Low suspicion for ACS at this time. Given her recent cath, would not initiate heparin gtt for possible NSTEMI. Likely volume overload in the setting of medication non-compliance causing her chest pain.  -Telemetry -Trend troponins -EKG in the AM   Acute on chronic systolic and diastolic CHF: LV EF 123XX123 on ECHO 05/2015. Severe diffuse hypokinesis. No regional wall abnormality. BNP is mildly elevated at 500 today. Weight at discharge from 05/2015 admission was 192 lbs. She reports to me that she normally takes Lasix 40 mg qam and 20 mg qhs. Does not weigh herself at home. Given her mild  exacerbation OK with restarting her home BB in addition to her losartan. Agree with initial diuresis with Lasix 40 mg IV bid.  -Repeat ECHO, if remains <35% will need discussion for ICD however given her history of noncompliance and poor follow up this is a complicated issue -Lasix 40mg  IV BID -Strict ins/outs, daily weights -Restart home losartan 50mg  daily and Metoprolol 50 mg daily.   Hypokalemia: Potassium 3.1 on admission. Repleted per primary.   HLD: Agree with continuing home simvastatin  HTN: BP at goal now. -Continue home losartan -Continue metoprolol    Anson Crofts PGY2 IM Resident [phone] 03/29/2016, 4:18 PM  Attending Note:   The patient was seen and examined.  Agree with assessment and plan as noted above.  Changes made to the above note as needed.  Patient seen and independently examined with Maryellen Pile, Resident MD .   We discussed all aspects of the encounter. I agree with the assessment and plan as stated above.  Pt has a long hx of CHF. Ran out of her meds at least a week ago meds have been restarted and she feels better.  Wants to go home tomrorrow  I do not think that she needs any further evaluation .   She just needs to have her meds restarted.  Anticipate Dc tomorrow on the same meds.  She has not been compliant with her CHF clinic follow up visits or her meds.   Will sign off. Call for questions    I have spent a total of 30 minutes with patient reviewing hospital  notes , telemetry, EKGs, labs and examining patient as well as establishing an assessment and plan that was discussed with the patient. > 50% of time was spent in direct patient care.    Thayer Headings, Brooke Bonito., MD, [facility] 03/29/2016, 4:53 PM [address]  [place] Pager [phone]

## 2016-03-29 NOTE — ED Notes (Signed)
Patient transported to X-ray 

## 2016-03-29 NOTE — ED Notes (Signed)
No blood draw,  Pt move to inpatient floor.

## 2016-03-29 NOTE — ED Notes (Signed)
Attempted report 

## 2016-03-30 DIAGNOSIS — I110 Hypertensive heart disease with heart failure: Secondary | ICD-10-CM | POA: Diagnosis not present

## 2016-03-30 DIAGNOSIS — R072 Precordial pain: Secondary | ICD-10-CM | POA: Diagnosis not present

## 2016-03-30 DIAGNOSIS — I252 Old myocardial infarction: Secondary | ICD-10-CM | POA: Diagnosis not present

## 2016-03-30 DIAGNOSIS — I5043 Acute on chronic combined systolic (congestive) and diastolic (congestive) heart failure: Secondary | ICD-10-CM | POA: Diagnosis not present

## 2016-03-30 LAB — BASIC METABOLIC PANEL
Anion gap: 9 (ref 5–15)
BUN: 12 mg/dL (ref 6–20)
CALCIUM: 8.9 mg/dL (ref 8.9–10.3)
CO2: 27 mmol/L (ref 22–32)
CREATININE: 0.76 mg/dL (ref 0.44–1.00)
Chloride: 103 mmol/L (ref 101–111)
GFR calc Af Amer: >60 mL/min (ref >60)
GFR calc non Af Amer: >60 mL/min (ref >60)
GLUCOSE: 120 mg/dL — AB (ref 65–99)
Potassium: 3.2 mmol/L — ABNORMAL LOW (ref 3.5–5.1)
Sodium: 139 mmol/L (ref 135–145)

## 2016-03-30 LAB — PHOSPHORUS: Phosphorus: 3 mg/dL (ref 2.5–4.6)

## 2016-03-30 LAB — TROPONIN I
Troponin I: 1.26 ng/mL (ref <0.03)
Troponin I: 1.08 ng/mL (ref <0.03)

## 2016-03-30 LAB — MAGNESIUM: MAGNESIUM: 2.1 mg/dL (ref 1.7–2.4)

## 2016-03-30 MED ORDER — METOPROLOL SUCCINATE ER 50 MG PO TB24: 50.0000 mg | ORAL_TABLET | Freq: Every day | ORAL | 1 refills | Status: DC

## 2016-03-30 MED ORDER — POTASSIUM CHLORIDE ER 20 MEQ PO TBCR: 1.0000 | EXTENDED_RELEASE_TABLET | Freq: Every day | ORAL | 1 refills | Status: DC

## 2016-03-30 MED ORDER — LOSARTAN POTASSIUM 50 MG PO TABS: 50.0000 mg | ORAL_TABLET | Freq: Every day | ORAL | 1 refills | Status: DC

## 2016-03-30 MED ORDER — ASPIRIN 81 MG PO TBEC
81.0000 mg | DELAYED_RELEASE_TABLET | Freq: Every day | ORAL | 0 refills | Status: DC
Start: 2016-03-31 — End: 2016-03-30

## 2016-03-30 MED ORDER — FUROSEMIDE 40 MG PO TABS: ORAL_TABLET | ORAL | 1 refills | Status: DC

## 2016-03-30 MED ORDER — SIMVASTATIN 10 MG PO TABS: 10.0000 mg | ORAL_TABLET | Freq: Every evening | ORAL | 1 refills | Status: DC

## 2016-03-30 MED ORDER — ASPIRIN 81 MG PO TBEC: 81.0000 mg | DELAYED_RELEASE_TABLET | Freq: Every day | ORAL | 0 refills | Status: DC

## 2016-03-30 MED ORDER — POTASSIUM CHLORIDE CRYS ER 20 MEQ PO TBCR
40.0000 meq | EXTENDED_RELEASE_TABLET | Freq: Once | ORAL | Status: AC
  Administered 2016-03-30: 40 meq via ORAL
  Filled 2016-03-30: qty 2

## 2016-03-30 MED FILL — FUROSEMIDE 40 MG TABLET: 40 | 30 days supply | Qty: 75 | Fill #0

## 2016-03-30 MED FILL — METOPROLOL SUCC ER 50 MG TA: 50 | 30 days supply | Qty: 30 | Fill #0

## 2016-03-30 MED FILL — SIMVASTATIN 10 MG TABLET: 10 | 30 days supply | Qty: 30 | Fill #0

## 2016-03-30 MED FILL — LOSARTAN POTASSIUM 50 MG TA: 50 | 30 days supply | Qty: 30 | Fill #0

## 2016-03-30 MED FILL — POTASSIUM CL ER 20 MEQ TAB: 20 | 30 days supply | Qty: 60 | Fill #0

## 2016-03-30 NOTE — Care Management Note (Signed)
Case Management Note  Patient Details  Name: Keagen Speckman MRN: [id number] Date of Birth: July 31, 1965  Subjective/Objective:  Pt presented for Chest Pain and Acute on Chronic CHF. Pt is without insurance. Patient states she works at an [employer]. No PCP @ this time.      Action/Plan: Pt states she has been to the [facility] in the past. It has been over a year since her last visit per [facility]. No appointments available at this time. CM was able to speak with Pat Kocher with [facility] in regards to f/u appointment. If patient is agreeable Opal Sidles will be able to schedule and appointment. Pt will be able to utilize the clinic for medications at d/c. Cost ranges from $4.00-$10.00. CM will continue to monitor for disposition needs.                  Expected Discharge Date:                  Expected Discharge Plan:  Home/Self Care  In-House Referral:  NA  Discharge planning Services  CM Consult  Post Acute Care Choice:  NA Choice offered to:  NA  DME Arranged:  N/A DME Agency:  NA  HH Arranged:  NA HH Agency:  NA  Status of Service:  Completed, signed off  If discussed at [place] of Stay Meetings, dates discussed:    Additional Comments:  Bethena Roys, RN 03/30/2016, 9:57 AM

## 2016-03-30 NOTE — Plan of Care (Signed)
Problem: Health Behavior/Discharge Planning: Goal: Ability to manage health-related needs will improve Outcome: Not Met (add Reason) Pt needs additional education and reinforcement of previous teaching

## 2016-03-30 NOTE — Progress Notes (Signed)
   Subjective: Patient denies further chest pain, shortness of breath, abdominal pain or any complaints at all. She is eager to leave and is asking to be discharged.   We spoke about medication affordability and offered [facility] pharmacy as option post d/c and ongoing if patient is willing to regularly follow up with the clinic. Patient states she would instead like to have the medications sent to [facility] and that she is able to afford them.   Objective:  Vital signs in last 24 hours: Vitals:   03/30/16 0000 03/30/16 0138 03/30/16 0500 03/30/16 0846  BP: 134/83 (!) 119/92 123/84 128/79  Pulse: 81 99 83 87  Resp:    18  Temp: 98.4 F (36.9 C) 98.8 F (37.1 C) 98.3 F (36.8 C) 100 F (37.8 C)  TempSrc: Oral Oral Oral Oral  SpO2: 99% 94% 96% 100%  Weight:   192 lb 3.2 oz (87.2 kg)   Height:       Constitutional: NAD, vs reviewed CV: RRR, no murmurs, rubs or gallops appreciated, minimal LE edema, pulses intact Resp: CTAB, no increased work of breathing, no crackles or wheezing appreciated Abd: soft, NDNT, +BS Ext: moves all 4 extremities freely  Assessment/Plan:  Principal Problem:   Systolic and diastolic CHF, acute on chronic (HCC) Active Problems:   Essential hypertension   H/O noncompliance with medical treatment, presenting hazards to health   Nonischemic dilated cardiomyopathy (HCC)   Elevated troponin   Nonspecific chest pain  Chest Pain:  Patient's states pain is resolved. Troponins peaked at 1.26 and trended down this AM. EKG unchanged this AM from admission. LHC in Jan 2017 with no evidence of CAD. Troponin spike likely due to heart strain from volume overload.  --stable --f/u with cardiology outpatient, has appointment  Acute on chronic systolic and diastolic CHF:  Patient asymptomatic currently, no output recorded, but apparent weight loss of 9lbs since admission. Patient did not want to stay for repeat ECHO inpatient. Exacerbation due to medication noncompliance. Cr  stable with IV lasix. --d/c on home dosing of lasix (60mg  q AM, additional 40mg  qPM PRN) --continue home metoprolol and losartan --Outpatient ECHO  Hypokalemia:  Initially 3.1 on admission, repleted. Low in AM again. --PO KDUR 40 mEq  Hyperlipidemia: Continue home simvastatin  Dispo: Anticipated discharge today.   Alphonzo Grieve, MD 03/30/2016, 11:58 AM Pager [phone]

## 2016-03-30 NOTE — Discharge Instructions (Signed)
Heart Failure  Heart failure means your heart has trouble pumping blood. This makes it hard for your body to work well. Heart failure is usually a long-term (chronic) condition. You must take good care of yourself and follow your doctor's treatment plan.  HOME CARE   Take your heart medicine as told by your doctor.    Do not stop taking medicine unless your doctor tells you to.    Do not skip any dose of medicine.    Refill your medicines before they run out.    Take other medicines only as told by your doctor or pharmacist.   Stay active if told by your doctor. The elderly and people with severe heart failure should talk with a doctor about physical activity.   Eat heart-healthy foods. Choose foods that are without trans fat and are low in saturated fat, cholesterol, and salt (sodium). This includes fresh or frozen fruits and vegetables, fish, lean meats, fat-free or low-fat dairy foods, whole grains, and high-fiber foods. Lentils and dried peas and beans (legumes) are also good choices.   Limit salt if told by your doctor.   Cook in a healthy way. Roast, grill, broil, bake, poach, steam, or stir-fry foods.   Limit fluids as told by your doctor.   Weigh yourself every morning. Do this after you pee (urinate) and before you eat breakfast. Write down your weight to give to your doctor.   Take your blood pressure and write it down if your doctor tells you to.   Ask your doctor how to check your pulse. Check your pulse as told.   Lose weight if told by your doctor.   Stop smoking or chewing tobacco. Do not use gum or patches that help you quit without your doctor's approval.   Schedule and go to doctor visits as told.   Nonpregnant women should have no more than 1 drink a day. Men should have no more than 2 drinks a day. Talk to your doctor about drinking alcohol.   Stop illegal drug use.   Stay current with shots (immunizations).   Manage your health conditions as told by your doctor.   Learn to  manage your stress.   Rest when you are tired.   If it is really hot outside:    Avoid intense activities.    Use air conditioning or fans, or get in a cooler place.    Avoid caffeine and alcohol.    Wear loose-fitting, lightweight, and light-colored clothing.   If it is really cold outside:    Avoid intense activities.    Layer your clothing.    Wear mittens or gloves, a hat, and a scarf when going outside.    Avoid alcohol.   Learn about heart failure and get support as needed.   Get help to maintain or improve your quality of life and your ability to care for yourself as needed.  GET HELP IF:    You gain weight quickly.   You are more short of breath than usual.   You cannot do your normal activities.   You tire easily.   You cough more than normal, especially with activity.   You have any or more puffiness (swelling) in areas such as your hands, feet, ankles, or belly (abdomen).   You cannot sleep because it is hard to breathe.   You feel like your heart is beating fast (palpitations).   You get dizzy or light-headed when you stand up.  GET HELP   RIGHT AWAY IF:    You have trouble breathing.   There is a change in mental status, such as becoming less alert or not being able to focus.   You have chest pain or discomfort.   You faint.  MAKE SURE YOU:    Understand these instructions.   Will watch your condition.   Will get help right away if you are not doing well or get worse.     This information is not intended to replace advice given to you by your health care provider. Make sure you discuss any questions you have with your health care provider.     Document Released: 02/17/2008 Document Revised: 05/31/2014 Document Reviewed: 06/26/2012  Elsevier Interactive Patient Education 2016 [employer] Inc[employer]

## 2016-03-30 NOTE — Progress Notes (Signed)
Cardiology notified and was up on floor and updated on VS and events related to her chest pain, elevated Troponins are elevated and MD is aware, will give some Toradol IV and continue to monitor. Patient feels much better on the oxygen and looks much more comfortable, will continue to monitor.

## 2016-03-30 NOTE — Hospital Discharge Follow-Up (Signed)
Transitional Care Clinic Care Coordination Note:  Admit date:  03/29/2016 Discharge date: 03/30/2016 Discharge Disposition: home Patient contact: cell # [phone] Emergency contact(s): she did not want to provide any other contact #  This Case Manager reviewed patient's EMR and determined patient would benefit from post-discharge medical management and chronic care management services through the [facility]. Patient has a history of NICM, HTN, stroke, chronic combined systolic and diastolic CHF, NSTEMI, OSA.  She was admitted with chest pain, dyspnea on exertion and orthopnea. She stated that she ran out of her medication. She is being treated for acute on chronic systolic and diastolic CHF. She has 3 hosptal admissions and 2 - ED visits in the past year. This Case Manager met with patient to discuss the services and medical management that can be provided at the [facility]. Patient verbalized understanding and agreed to receive post-discharge care at the [facility].   Patient scheduled for Transitional Care appointment on 04/06/16 @ 1400  Clinic information and appointment time provided to patient. Appointment information also placed on AVS.  Assessment: The patient was very anxious to leave the hospital and did not want to talk at length.        Home Environment: She lives with her 40, 73 year old sons. First floor residence.        Support System:  Her sons. In addition to the 2 sons at home with her she has a 35 year old son who lives close by.        Level of functioning: independent       Home DME: none reported.        Home care services: (services arranged prior to discharge or new services after discharge): none and she said that she does not want to have anyone taking care of her. She again noted that she is very independent.        Transportation: She has a drivers' license but no car. Her sons or a best friend can drive her to her appointments  or she takes the bus. .         Food/Nutrition: (ability to afford, access, use of any community resources): She receives $15/month in food stamps. She does not want any information about food pantries or free meals in [place]. She said that the food from the food pantries has too much salt and she understands that she needs to monitor her sodium intake. She also noted that she likes to cook and care take care of her self and doesn't need free meals.         Medications: (ability to afford, access, compliance, Pharmacy used) [facility]. She said that she understands when her balance gets over $100 she needs to make a payment on the account. She said that she will usually try to pay about $15 but is aware that she can pay as little at $1.00.         Identified Barriers: no insurance, limited income.  She said that she applied for medicaid and disability about 3 months ago and is waiting for a decision about her applications. She noted that she has a caseworker assigned to her. She also noted that her rent has increased now that her sons are older         PCP (Name, office location, phone number): Dr Adrian Blackwater is noted but she has not seen Dr Adrian Blackwater in almost a year.   Patient Education: She stated that she is  aware of the services at [facility].             Arranged services:        Services communicated to/with Jacqlyn Krauss, RN CM

## 2016-03-30 NOTE — Progress Notes (Signed)
Report received in patient's room via [place] using [employer], reviewed orders, labs, VS, meds, test and patient's general condition, assumed care of patient.

## 2016-03-30 NOTE — Plan of Care (Signed)
Problem: Education: Goal: Ability to demonstrate managment of disease process will improve Outcome: Progressing Patient given the Living well with hear failure booklet and instructed on how to get the video on heart failure, also educated on effects of smoking and diet, given low sodium information for her diet, will continue to instruct and follow up, all questions answered at this time.

## 2016-03-30 NOTE — Plan of Care (Signed)
Problem: Safety: Goal: Ability to remain free from injury will improve Outcome: Completed/Met Date Met: 03/30/16 Patient instructed on how to use the call light and information on phone numbers on the white board to get her RN/NT and she verbalized understanding, all personal items within reach. Patient uses call light appropropriately.

## 2016-03-30 NOTE — Progress Notes (Signed)
Patient called out for RN, went back and she was c/o some chest heaviness with some diaphoresis and slight SOB, VS gotten at this time and will get an EKG and continue to monitor. Patient placed on 2L O2 via N/C and was given a GI Cocktail for relief, will continue to monitor.

## 2016-03-30 NOTE — Discharge Summary (Signed)
Name: Chelsea Branch MRN: [id number] DOB: [birth year] 50 y.o. PCP: Chelsea Nearing, MD  Date of Admission: 03/29/2016  7:48 AM Date of Discharge: 03/30/2016 Attending Physician: Dr. Heber [place]  Discharge Diagnosis: 1. Acute on chronic systolic and diastolic congestive heart failure Principal Problem:   Systolic and diastolic CHF, acute on chronic (HCC) Active Problems:   Essential hypertension   H/O noncompliance with medical treatment, presenting hazards to health   Nonischemic dilated cardiomyopathy (HCC)   Elevated troponin   Nonspecific chest pain   Discharge Medications:   Medication List    TAKE these medications   aspirin 81 MG EC tablet Take 1 tablet (81 mg total) by mouth daily.   furosemide 40 MG tablet Commonly known as:  LASIX Take 1.5 tablet (60 mg)by mouth in the AM and take an additional 1 tablet (40mg ) by mouth in the PM if necessary What changed:  how much to take  how to take this  when to take this  additional instructions   ibuprofen 600 MG tablet Commonly known as:  ADVIL,MOTRIN Take 1 tablet (600 mg total) by mouth every 6 (six) hours as needed. What changed:  reasons to take this   losartan 50 MG tablet Commonly known as:  COZAAR Take 1 tablet (50 mg total) by mouth daily.   megestrol 40 MG tablet Commonly known as:  MEGACE TAKE 1/2 TABLET BY MOUTH DAILY What changed:  See the new instructions.   metoprolol succinate 50 MG 24 hr tablet Commonly known as:  TOPROL-XL Take 1 tablet (50 mg total) by mouth daily.   Potassium Chloride ER 20 MEQ Tbcr Take 1 tablet by mouth daily. TAKE 1 extra tablet when you take extra Furosemide (Lasix) What changed:  how much to take  when to take this  additional instructions   simvastatin 10 MG tablet Commonly known as:  ZOCOR Take 1 tablet (10 mg total) by mouth every evening.       Disposition and follow-up:   Ms.Chelsea Branch was discharged from [facility] in [place]  condition.  At the hospital follow up visit please address:  1.   --is she able to get all of her medications? --is she taking her medications as prescribed? --any change in her weight? --any further chest pain?  2.  Labs / imaging needed at time of follow-up: BMP for potassium, outpatient ECHO to evaluate heart function  3.  Pending labs/ test needing follow-up: None  Follow-up Appointments: Follow-up Information    [place]. Go on 04/06/2016.   Why:  at 2:00pm for an appointment in the [place] with Dr Chelsea Branch. Please bring all of your medications with you.  Contact information: [place] [phone] [place] Hospital Course by problem list: Principal Problem:   Systolic and diastolic CHF, acute on chronic (HCC) Active Problems:   Essential hypertension   H/O noncompliance with medical treatment, presenting hazards to health   Nonischemic dilated cardiomyopathy (HCC)   Elevated troponin   Nonspecific chest pain   50yo female with history of non-ischemic cardiomyopathy, chronic systolic and diastolic heart failure, OSA, HTN, HLD presenting to the ED with week long chest pain, increasing dyspnea and orthopnea.  Chest Pain: Patient presented to the [facility] with one week of chest pain, worsening shortness of breath, and orthopnea after she ran out of her medicines. She has not had the money to refill them  yet by the time she presented to the ED. Her initial EKG was unchanged from previous, and CXR did not show acute abnormality. BNP was elevated and troponins were initially elevated then began trending down. She recently had a heart cath in Jan 2017 which did not show CAD. With that reassurance it was thought that chest pain was due to demand from CHF exacerbation; with restarting of patient's meds and adequate diuresis, the pain subsided. Patient attempted to leave AMA before our evaluation  of her the day after admission due to feeling better. Patient was adamant that she was able to pick up her medications day of leaving hospital (we sent them in to [facility]) and could not be convinced to stay for further diuresis. She stated understanding of need for medication compliance and close follow up with cardiology and [place].   Acute on chronic systolic and diastolic CHF:  Patient with history of NICM; last echo on 06/15/2015 showing LVEF 25-30% with severe diffuse hypokinesis. Patient presented with week of chest pain, increasing dyspnea and orthopnea after running out of medications one week prior. BNP elevated on admission and troponins initially trending up reaching peak of 1.26 before trending down. Troponin bump likely due to demand ischemia. We started IV lasix for diuresis with recorded weight dropping 11lbs from admission to discharge. Exacerbation due to medication noncompliance due to unaffordability. Patient declined inpatient ECHO, but did state understanding of importance of following up with cardiology and [facility] for further management. She was discharged on her home dosing of Lasix (60mg  qAM, additional 40mg  qPM PRN). She was continued on her home metoprolol and losartan. It appears her dry weight is about 190-193lb.   Essential Hypertension: Patient with history of hypertension managed with metoprolol and losartan at home. Her SBP was at the highest in 140's on admission. Her home medicines were restarted and she was subsequently normotensive on discharge.   Hypokalemia:  Patient with hypokalemia of 3.1 on admission and on recheck in AM, which was repleted twice. Unfortunately, patient had left AMA and further monitoring of potassium was not an option. Please recheck BMP for potassium level when patient presents for follow up.   Hyperlipidemia: Patient with history of hyperlipidemia; she was restarted on her home simvastatin.   Discharge Vitals:   BP 128/79 (BP Location: Right  Arm)   Pulse 87   Temp 100 F (37.8 C) (Oral)   Resp 18   Ht 5\' 8"  (1.727 m)   Wt 192 lb 3.2 oz (87.2 kg)   SpO2 100%   BMI 29.22 kg/m   Pertinent Labs, Studies, and Procedures:  CBC    Component Value Date/Time   WBC 8.4 03/29/2016 0749   RBC 4.79 03/29/2016 0749   HGB 14.3 03/29/2016 0749   HCT 41.6 03/29/2016 0749   PLT 169 03/29/2016 0749   MCV 86.8 03/29/2016 0749   MCH 29.9 03/29/2016 0749   MCHC 34.4 03/29/2016 0749   RDW 12.8 03/29/2016 0749   LYMPHSABS 2.2 11/02/2014 1529   MONOABS 0.3 11/02/2014 1529   EOSABS 0.1 11/02/2014 1529   BASOSABS 0.0 11/02/2014 1529   BMP Latest Ref Rng & Units 04/01/2016 03/30/2016 03/29/2016  Glucose 65 - 99 mg/dL 100(H) 120(H) 101(H)  BUN 7 - 25 mg/dL 10 12 6   Creatinine 0.50 - 1.05 mg/dL 0.78 0.76 0.66  Sodium 135 - 146 mmol/L 141 139 141  Potassium 3.5 - 5.3 mmol/L 3.8 3.2(L) 3.1(L)  Chloride 98 - 110 mmol/L 105 103 106  CO2 20 -  31 mmol/L 22 27 26   Calcium 8.6 - 10.4 mg/dL 8.9 8.9 8.8(L)   CXR 03/29/2016: No edema or consolidation; no evidence of acute cardiopulmonary process.  Discharge Instructions: Discharge Instructions    (HEART FAILURE PATIENTS) Call MD:  Anytime you have any of the following symptoms: 1) 3 pound weight gain in 24 hours or 5 pounds in 1 week 2) shortness of breath, with or without a dry hacking cough 3) swelling in the hands, feet or stomach 4) if you have to sleep on extra pillows at night in order to breathe.    Complete by:  As directed    Call MD for:  difficulty breathing, headache or visual disturbances    Complete by:  As directed    Call MD for:  persistant dizziness or light-headedness    Complete by:  As directed    Call MD for:  persistant nausea and vomiting    Complete by:  As directed    Call MD for:  severe uncontrolled pain    Complete by:  As directed    Call MD for:  temperature >100.4    Complete by:  As directed    Diet - low sodium heart healthy    Complete by:  As directed     Discharge instructions    Complete by:  As directed    Please take your medications as prescribed. Please follow up with the appointments that were made for you.   Increase activity slowly    Complete by:  As directed       Signed: Alphonzo Grieve, MD 04/05/2016, 8:54 PM   Pager [phone]

## 2016-03-31 ENCOUNTER — Telehealth: Payer: Self-pay

## 2016-03-31 NOTE — Telephone Encounter (Signed)
Transitional Care Clinic Post-discharge Follow-Up Phone Call:  Date of Discharge: 03/30/16 Principal Discharge Diagnosis(es): Systolic and diastolic CHF Post-discharge Communication: This Case Manager placed call to patient to complete post-discharge follow-up phone call. Call placed to [phone]. Patient answered phone and then requested to call this Case Manager back at a later time. Awaiting return call from patient. Call Completed: No

## 2016-04-01 ENCOUNTER — Encounter: Payer: Self-pay | Admitting: Physician Assistant

## 2016-04-01 ENCOUNTER — Encounter (INDEPENDENT_AMBULATORY_CARE_PROVIDER_SITE_OTHER): Payer: Self-pay

## 2016-04-01 ENCOUNTER — Ambulatory Visit (INDEPENDENT_AMBULATORY_CARE_PROVIDER_SITE_OTHER): Payer: Self-pay | Admitting: Physician Assistant

## 2016-04-01 ENCOUNTER — Telehealth: Payer: Self-pay

## 2016-04-01 VITALS — BP 138/98 | HR 83 | Ht 68.0 in | Wt 196.5 lb

## 2016-04-01 DIAGNOSIS — I10 Essential (primary) hypertension: Secondary | ICD-10-CM

## 2016-04-01 DIAGNOSIS — I428 Other cardiomyopathies: Secondary | ICD-10-CM

## 2016-04-01 DIAGNOSIS — Z91199 Patient's noncompliance with other medical treatment and regimen due to unspecified reason: Secondary | ICD-10-CM

## 2016-04-01 DIAGNOSIS — E785 Hyperlipidemia, unspecified: Secondary | ICD-10-CM

## 2016-04-01 DIAGNOSIS — Z9119 Patient's noncompliance with other medical treatment and regimen: Secondary | ICD-10-CM

## 2016-04-01 DIAGNOSIS — I5042 Chronic combined systolic (congestive) and diastolic (congestive) heart failure: Secondary | ICD-10-CM

## 2016-04-01 NOTE — Progress Notes (Addendum)
Cardiology Office Note    Date:  04/01/2016  ID:  [name], DOB Feb 24, 1966, MRN [id number] PCP:  Minerva Ends, MD  Cardiologist:  Dr. Caryl Comes   Chief Complaint: f/u CHF  History of Present Illness:  Chelsea Navarrette is a 50 y.o. 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 Originally the appt was made as a 2-3 month follow-up but she was recently admitted in the meantime. She was admitted 03/2016 with chest discomfort, worsening SOB, and orthponea in the setting of running out of her medications 1 week prior. Troponin peaked at 1.26, felt secondary to demand ischemia from acute on chronic combined CHF. She was diuresed and potassium was repleted. On 11/7 she was threatening to leave AMA that morning - by the afternoon she was feeling better. It was recommended she stay to optimize HF treatment but she declined. Last echo 06/2015 showed EF 25-30%, severe diffuse HK, moderate MR, mod LAE. Compliance has been an issue per notes. Last labs 03/2016: K 3.2, Cr 0.76, CBC wnl.  She returns for follow-up overall feeling much better. She reports that she picked up her medications yesterday and started taking them as prescribed. Denies tobacco, alcohol, drug use. Does drink 2 Dr. Samson Frederic per day. No further chest pain or SOB. No LEE or syncope.   Past Medical History:  Diagnosis Date  . Abnormal uterine bleeding   . Alopecia   . Anemia   . Angina pectoris with normal coronary arteriogram ([place]) 2017   Had + Troponin c/w ? NSTEMI due to A on C CHF  . 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 Chronic combined systolic and diastolic CHF (congestive heart failure) ([place])   . DYSLIPIDEMIA   . Fibroids   . H/O noncompliance with medical treatment, presenting hazards to health   . Hypertension   . Menorrhagia   . Nonischemic cardiomyopathy  ([place]) 1994; 2017   a. iniatially ?2/2 peripartum in 1994 - improved by 2008 then worsening EF in 2011 back down to EF 25-30%. b. echo 01/21/14 showed mod LVH, [name]; [name], [name], [name],   . NSTEMI (non-ST elevated myocardial infarction) ([place]) 05/2015   Normal Coronaries.  . Peripartum cardiomyopathy 1994  . Sleep apnea 2015   CPAP 12/2013  . Stroke [facility]) 2011  . Tobacco abuse     Past Surgical History:  Procedure Laterality Date  . CARDIAC CATHETERIZATION N/A 06/16/2015   Procedure: Left Heart Cath and Coronary Angiography;  Surgeon: Jettie Booze, MD;  Location: [facility];  Service: Cardiovascular;  Laterality: N/A;  . [place]  . LOOP RECORDER IMPLANT  ~ 2000  . TIBIAL TUBERCLERPLASTY    . TUBAL LIGATION  1994    Current Medications: Current Outpatient Prescriptions  Medication Sig Dispense Refill  . aspirin 81 MG EC tablet Take 1 tablet (81 mg total) by mouth daily. 30 tablet 0  . furosemide (LASIX) 40 MG tablet Take 1.5 tablet (60 mg)by mouth in the AM and take an additional 1 tablet (40mg ) by mouth in the PM if necessary 75 tablet 1  . ibuprofen (ADVIL,MOTRIN) 600 MG tablet Take 1 tablet (600 mg total) by mouth every 6 (six) hours as needed. (Patient taking differently: Take 600 mg by mouth every 6 (six) hours as needed for moderate pain. ) 30  tablet 0  . losartan (COZAAR) 50 MG tablet Take 1 tablet (50 mg total) by mouth daily. 30 tablet 1  . megestrol (MEGACE) 40 MG tablet TAKE 1/2 TABLET BY MOUTH DAILY (Patient taking differently: TAKE 20 MG BY MOUTH AS NEEDED FOR BLEEDING) 30 tablet 0  . metoprolol succinate (TOPROL-XL) 50 MG 24 hr tablet Take 1 tablet (50 mg total) by mouth daily. 30 tablet 1  . Potassium Chloride ER 20 MEQ TBCR Take 1 tablet by mouth daily. TAKE 1 extra tablet when you take extra Furosemide (Lasix) 60 tablet 1  . simvastatin (ZOCOR) 10 MG tablet Take 1 tablet (10 mg total) by mouth every  evening. 30 tablet 1   No current facility-administered medications for this visit.      Allergies:   Ace inhibitors   Social History   Social History  . Marital status: Single    Spouse name: N/A  . Number of children: 3  . Years of education: 12   Occupational History  . unemployed    Social History Main Topics  . Smoking status: Former Smoker    Packs/day: 0.10    Years: 30.00    Types: Cigarettes    Quit date: 06/03/2015  . Smokeless tobacco: Never Used  . Alcohol use No  . Drug use: No  . Sexual activity: Yes    Birth control/ protection: IUD   Other Topics Concern  . None   Social History Narrative   Lives at home with 51 yo twins ([place] and [place])   33 yo son lives outside the home   72 yo granddaughter      Family History:  The patient's family history includes Cancer in her maternal grandmother; Hypertension in her sister.   ROS:   Please see the history of present illness.  All other systems are reviewed and otherwise negative.    PHYSICAL EXAM:   VS:  BP (!) 138/98 (BP Location: Left Arm, Patient Position: Sitting, Cuff Size: Normal)   Pulse 83   Ht 5\' 8"  (1.727 m)   Wt 196 lb 8 oz (89.1 kg)   SpO2 96%   BMI 29.88 kg/m   BMI: Body mass index is 29.88 kg/m. GEN: Well nourished, well developed AAF, in no acute distress  HEENT: normocephalic, atraumatic Neck: no JVD, carotid bruits, or masses Cardiac: RRR; no murmurs, rubs, or gallops, no edema  Respiratory:  clear to auscultation bilaterally, normal work of breathing GI: soft, nontender, nondistended, + BS MS: no deformity or atrophy  Skin: warm and dry, no rash Neuro:  Alert and Oriented x 3, Strength and sensation are intact, follows commands Psych: euthymic mood, full affect  Wt Readings from Last 3 Encounters:  04/01/16 196 lb 8 oz (89.1 kg)  03/30/16 192 lb 3.2 oz (87.2 kg)  02/23/16 186 lb (84.4 kg)      Studies/Labs Reviewed:   EKG:  EKG was ordered today and personally  reviewed by me and demonstrates NSR 79bpm, LVH with repol abnormality  Recent Labs: 06/14/2015: ALT 27; TSH 0.638 03/29/2016: B Natriuretic Peptide 505.1; Hemoglobin 14.3; Platelets 169 03/30/2016: BUN 12; Creatinine, Ser 0.76; Magnesium 2.1; Potassium 3.2; Sodium 139   Lipid Panel    Component Value Date/Time   CHOL 135 06/15/2015 0450   TRIG 53 06/15/2015 0450   HDL 35 (L) 06/15/2015 0450   CHOLHDL 3.9 06/15/2015 0450   VLDL 11 06/15/2015 0450   LDLCALC 89 06/15/2015 0450    Additional studies/ records that were  reviewed today include: Summarized above.    ASSESSMENT & PLAN:   1. Chronic combined CHF/NICM - recent demand ischemia in the setting of volume overload and accelerated HTN.  Volume status appears stable by exam today. Weight is slightly up but she is asymptomatic. Suspect she'll continue to diurese some on the Lasix as this was just recently restarted. BP is elevated thus will increase losartan to 100mg  daily. Check BMET today given recent hypokalemia. Will need echo in 3 months to reassess LVEF. Reviewed importance of low sodium diet and fluid restriction. 2. Essential HTN - see above. She is considering getting a cuff to follow her BP at home versus checking it periodically at the pharmacy. Also advised she decrease caffeine intake. 3. Hyperlipidemia - continue statin. 4. H/o noncompliance - importance of compliance reinforced.  Disposition: F/u with me in 3 months with echo at that time.   Medication Adjustments/Labs and Tests Ordered: Current medicines are reviewed at length with the patient today.  Concerns regarding medicines are outlined above. Medication changes, Labs and Tests ordered today are summarized above and listed in the Patient Instructions accessible in Encounters.   Raechel Ache PA-C  04/01/2016 4:25 PM    [facility] [place], [place], [place]  [address] Phone: [phone]; Fax: [phone]

## 2016-04-01 NOTE — Telephone Encounter (Signed)
Transitional Care Clinic Post-discharge Follow-Up Phone Call:  Date of Discharge: 03/30/16 Principal Discharge Diagnosis(es): Systolic and diastolic CHF Call Completed: Yes                    With Whom: Patient    Please check all that apply:  X  Patient is knowledgeable of his/her condition(s) and/or treatment. X  Patient is caring for self at home.  ? Patient is receiving assist at home from family and/or caregiver. Family and/or caregiver is knowledgeable of patient's condition(s) and/or treatment. ? Patient is receiving home health services. If so, name of agency.     Medication Reconciliation:  X  Medication list reviewed with patient. X  Patient obtained all discharge medications-YES. Patient's discharge medications thoroughly reviewed, and patient indicated she has picked up all discharge medications. Also reviewed patient's entire medication list. Patient indicated she has all medications.    Activities of Daily Living:  X  Independent ? Needs assist  ? Total Care   Community resources in place for patient:  X  None  ? Home Health/Home DME ? Assisted Living ? Support Group        Questions/Concerns discussed: This Case Manager inquired about patient's status. Patient indicated she was having "a little" shortness of breath. She said she knows to sit down if needed. Informed patient of when to seek emergency medical attention, and patient indicated she was aware. Patient indicated she has a Cardiology appointment today at 1530. Patient reminded of [facility] appointment on 04/06/16 at 1400, and patient indicated she plans to be at appointment. She indicated she will have transportation.

## 2016-04-01 NOTE — Patient Instructions (Signed)
Medication Instructions:  Your physician recommends that you continue on your current medications as directed. Please refer to the Current Medication list given to you today.   Labwork: TODAY:  BMET  Testing/Procedures: Your physician has requested that you have an echocardiogram IN 3 MONTHS.  Echocardiography is a painless test that uses sound waves to create images of your heart. It provides your doctor with information about the size and shape of your heart and how well your heart's chambers and valves are working. This procedure takes approximately one hour. There are no restrictions for this procedure.    Follow-Up: Your physician recommends that you schedule a follow-up appointment in: [place], PA-C (1-2 DAYS AFTER ECHOCARDIOGRAM)   Any Other Special Instructions Will Be Listed Below (If Applicable). Echocardiogram An echocardiogram, or echocardiography, uses sound waves (ultrasound) to produce an image of your heart. The echocardiogram is simple, painless, obtained within a short period of time, and offers valuable information to your health care provider. The images from an echocardiogram can provide information such as:  Evidence of coronary artery disease (CAD).  Heart size.  Heart muscle function.  Heart valve function.  Aneurysm detection.  Evidence of a past heart attack.  Fluid buildup around the heart.  Heart muscle thickening.  Assess heart valve function. LET [facility] CARE PROVIDER KNOW ABOUT:  Any allergies you have.  All medicines you are taking, including vitamins, herbs, eye drops, creams, and over-the-counter medicines.  Previous problems you or members of your family have had with the use of anesthetics.  Any blood disorders you have.  Previous surgeries you have had.  Medical conditions you have.  Possibility of pregnancy, if this applies. BEFORE THE PROCEDURE  No special preparation is needed. Eat and drink normally.   PROCEDURE   In order to produce an image of your heart, gel will be applied to your chest and a wand-like tool (transducer) will be moved over your chest. The gel will help transmit the sound waves from the transducer. The sound waves will harmlessly bounce off your heart to allow the heart images to be captured in real-time motion. These images will then be recorded.  You may need an IV to receive a medicine that improves the quality of the pictures. AFTER THE PROCEDURE You may return to your normal schedule including diet, activities, and medicines, unless your health care provider tells you otherwise.   This information is not intended to replace advice given to you by your health care provider. Make sure you discuss any questions you have with your health care provider.   Document Released: 05/07/2000 Document Revised: 05/31/2014 Document Reviewed: 01/15/2013 Elsevier Interactive Patient Education [employer].     If you need a refill on your cardiac medications before your next appointment, please call your pharmacy.

## 2016-04-02 LAB — BASIC METABOLIC PANEL
BUN: 10 mg/dL (ref 7–25)
CHLORIDE: 105 mmol/L (ref 98–110)
CO2: 22 mmol/L (ref 20–31)
CREATININE: 0.78 mg/dL (ref 0.50–1.05)
Calcium: 8.9 mg/dL (ref 8.6–10.4)
Glucose, Bld: 100 mg/dL — ABNORMAL HIGH (ref 65–99)
Potassium: 3.8 mmol/L (ref 3.5–5.3)
SODIUM: 141 mmol/L (ref 135–146)

## 2016-04-05 ENCOUNTER — Telehealth: Payer: Self-pay

## 2016-04-05 NOTE — Telephone Encounter (Signed)
Call placed to the patient and confirmed her appointment for tomorrow, 04/06/16 @ 1400 at [facility] She also confirmed that she has transportation to the clinic. Reminded her to bring all of her medications with her to her appointment.

## 2016-04-06 ENCOUNTER — Ambulatory Visit: Payer: Medicaid Other | Attending: Family Medicine | Admitting: Family Medicine

## 2016-04-06 ENCOUNTER — Encounter: Payer: Self-pay | Admitting: Family Medicine

## 2016-04-06 VITALS — BP 116/81 | HR 77 | Temp 98.0°F | Ht 68.0 in | Wt 193.8 lb

## 2016-04-06 DIAGNOSIS — Z8673 Personal history of transient ischemic attack (TIA), and cerebral infarction without residual deficits: Secondary | ICD-10-CM | POA: Insufficient documentation

## 2016-04-06 DIAGNOSIS — Z7982 Long term (current) use of aspirin: Secondary | ICD-10-CM | POA: Insufficient documentation

## 2016-04-06 DIAGNOSIS — Z975 Presence of (intrauterine) contraceptive device: Secondary | ICD-10-CM | POA: Diagnosis not present

## 2016-04-06 DIAGNOSIS — Z888 Allergy status to other drugs, medicaments and biological substances status: Secondary | ICD-10-CM | POA: Diagnosis not present

## 2016-04-06 DIAGNOSIS — I10 Essential (primary) hypertension: Secondary | ICD-10-CM

## 2016-04-06 DIAGNOSIS — I110 Hypertensive heart disease with heart failure: Secondary | ICD-10-CM | POA: Insufficient documentation

## 2016-04-06 DIAGNOSIS — Z91199 Patient's noncompliance with other medical treatment and regimen due to unspecified reason: Secondary | ICD-10-CM

## 2016-04-06 DIAGNOSIS — I428 Other cardiomyopathies: Secondary | ICD-10-CM | POA: Diagnosis not present

## 2016-04-06 DIAGNOSIS — Z9119 Patient's noncompliance with other medical treatment and regimen: Secondary | ICD-10-CM | POA: Diagnosis not present

## 2016-04-06 DIAGNOSIS — I5022 Chronic systolic (congestive) heart failure: Secondary | ICD-10-CM | POA: Diagnosis present

## 2016-04-06 DIAGNOSIS — D259 Leiomyoma of uterus, unspecified: Secondary | ICD-10-CM | POA: Insufficient documentation

## 2016-04-06 DIAGNOSIS — E785 Hyperlipidemia, unspecified: Secondary | ICD-10-CM | POA: Insufficient documentation

## 2016-04-06 DIAGNOSIS — Z79899 Other long term (current) drug therapy: Secondary | ICD-10-CM | POA: Insufficient documentation

## 2016-04-06 DIAGNOSIS — N939 Abnormal uterine and vaginal bleeding, unspecified: Secondary | ICD-10-CM | POA: Insufficient documentation

## 2016-04-06 DIAGNOSIS — I5043 Acute on chronic combined systolic (congestive) and diastolic (congestive) heart failure: Secondary | ICD-10-CM | POA: Insufficient documentation

## 2016-04-06 DIAGNOSIS — I252 Old myocardial infarction: Secondary | ICD-10-CM | POA: Diagnosis not present

## 2016-04-06 DIAGNOSIS — L659 Nonscarring hair loss, unspecified: Secondary | ICD-10-CM

## 2016-04-06 LAB — COMPLETE METABOLIC PANEL WITH GFR
ALT: 14 U/L (ref 6–29)
AST: 15 U/L (ref 10–35)
Albumin: 4 g/dL (ref 3.6–5.1)
Alkaline Phosphatase: 81 U/L (ref 33–130)
BUN: 13 mg/dL (ref 7–25)
CALCIUM: 9.3 mg/dL (ref 8.6–10.4)
CHLORIDE: 105 mmol/L (ref 98–110)
CO2: 27 mmol/L (ref 20–31)
Creat: 0.82 mg/dL (ref 0.50–1.05)
GFR, EST NON AFRICAN AMERICAN: 83 mL/min (ref >=60)
Glucose, Bld: 81 mg/dL (ref 65–99)
POTASSIUM: 4 mmol/L (ref 3.5–5.3)
Sodium: 140 mmol/L (ref 135–146)
Total Bilirubin: 0.6 mg/dL (ref 0.2–1.2)
Total Protein: 7 g/dL (ref 6.1–8.1)

## 2016-04-06 LAB — TSH: TSH: 0.24 m[IU]/L — AB

## 2016-04-06 MED ORDER — LOSARTAN POTASSIUM 100 MG PO TABS: 100.0000 mg | ORAL_TABLET | Freq: Every day | ORAL | 3 refills | Status: DC

## 2016-04-06 NOTE — Progress Notes (Signed)
  Took asprin once a day as precribed by hospital and  noticed spotting about a week ago. Has IUD in place and does not know what the bleeding is from. Thinks it is form taking asprin. Had a previous fall a year ago pain in lower back not in pain today.

## 2016-04-06 NOTE — Progress Notes (Signed)
Transitional care clinic  Date of telephone encounter: 04/01/16  Hospitalization dates: 03/29/16 through 03/30/2016  Subjective:  [name], female    DOB: June 13, 1965  Age: 50 y.o. MRN: [id number]  CC: Follow-up   HPI Chelsea Branch is a 50 year old female with a history of hypertension, chronic systolic CHF (EF 123XX123, severe diffuse hypokinesis from 2-D echo 05/2015), nonischemic cardiomyopathy who presents to the transitional care clinic after hospitalization for CHF exacerbation.  She had presented with worsening shortness of breath, chest pain after running out of her medications; BNP was elevated and cardiac enzymes were elevated to a peak of 1.26 with subsequently downward trend. Cardiac cath from 05/2015 had revealed severe left ventricular systolic dysfunction, no significant coronary artery disease, LVEDP elevated, nonischemic cardiomyopathy, continue medical therapy. Her antihypertensives were restarted and she received aggressive diuresis; elevated cardiac enzyme was thought to be secondary to demand ischemia. She was subsequently discharged on her medications after she had attempted to leave AMA.  Today she informs me she has picked up medications and denies shortness of breath or chest pains. She is concerned about spotting since discharge and attributes this to being placed on aspirin. She has a Mirena in place which was [facility] the [facility] last year due to presence of menorrhagia and fibroids. She also complains of her hair falling out and does not use chemical on her hair. Review of thyroid labs indicate most recent TSH was normal but prior to that was suppressed.  Past Medical History:  Diagnosis Date  . Abnormal uterine bleeding   . Alopecia   . Anemia   . Angina pectoris with normal coronary arteriogram ([place]) 2017   Had + Troponin c/w ? NSTEMI due to A on C CHF  . 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 Chronic combined systolic and diastolic  CHF (congestive heart failure) ([place])   . DYSLIPIDEMIA   . Fibroids   . H/O noncompliance with medical treatment, presenting hazards to health   . Hypertension   . Menorrhagia   . Nonischemic cardiomyopathy ([place]) 1994; 2017   a. iniatially ?2/2 peripartum in 1994 - improved by 2008 then worsening EF in 2011 back down to EF 25-30%. b. echo 01/21/14 showed mod LVH, [name]; [name], [name], [name],   . NSTEMI (non-ST elevated myocardial infarction) ([place]) 05/2015   Normal Coronaries.  . Peripartum cardiomyopathy 1994  . Sleep apnea 2015   CPAP 12/2013  . Stroke [facility]) 2011  . Tobacco abuse     Past Surgical History:  Procedure Laterality Date  . CARDIAC CATHETERIZATION N/A 06/16/2015   Procedure: Left Heart Cath and Coronary Angiography;  Surgeon: Jettie Booze, MD;  Location: [facility];  Service: Cardiovascular;  Laterality: N/A;  . [place]  . LOOP RECORDER IMPLANT  ~ 2000  . TIBIAL TUBERCLERPLASTY    . TUBAL LIGATION  1994    Allergies  Allergen Reactions  . Ace Inhibitors Other (See Comments)    REACTION: Cough     Outpatient Medications Prior to Visit  Medication Sig Dispense Refill  . aspirin 81 MG EC tablet Take 1 tablet (81 mg total) by mouth daily. 30 tablet 0  . furosemide (LASIX) 40 MG tablet Take 1.5 tablet (60 mg)by mouth in the AM and take an additional 1 tablet (40mg ) by mouth in the PM if necessary 75 tablet 1  . metoprolol succinate (TOPROL-XL) 50 MG 24 hr tablet  Take 1 tablet (50 mg total) by mouth daily. 30 tablet 1  . Potassium Chloride ER 20 MEQ TBCR Take 1 tablet by mouth daily. TAKE 1 extra tablet when you take extra Furosemide (Lasix) 60 tablet 1  . simvastatin (ZOCOR) 10 MG tablet Take 1 tablet (10 mg total) by mouth every evening. 30 tablet 1  . losartan (COZAAR) 50 MG tablet Take 1 tablet (50 mg total) by mouth daily. 30 tablet 1  . ibuprofen (ADVIL,MOTRIN) 600 MG tablet Take 1 tablet (600 mg  total) by mouth every 6 (six) hours as needed. (Patient taking differently: Take 600 mg by mouth every 6 (six) hours as needed for moderate pain. ) 30 tablet 0  . megestrol (MEGACE) 40 MG tablet TAKE 1/2 TABLET BY MOUTH DAILY (Patient taking differently: TAKE 20 MG BY MOUTH AS NEEDED FOR BLEEDING) 30 tablet 0   No facility-administered medications prior to visit.     ROS Review of Systems  Constitutional: Negative for activity change, appetite change and fatigue.  HENT: Negative for congestion, sinus pressure and sore throat.   Eyes: Negative for visual disturbance.  Respiratory: Negative for cough, chest tightness, shortness of breath and wheezing.   Cardiovascular: Negative for chest pain and palpitations.  Gastrointestinal: Negative for abdominal distention, abdominal pain and constipation.  Endocrine: Negative for polydipsia.  Genitourinary: Positive for menstrual problem. Negative for dysuria and frequency.  Musculoskeletal: Negative for arthralgias and back pain.  Skin: Negative for rash.  Neurological: Negative for tremors, light-headedness and numbness.  Hematological: Does not bruise/bleed easily.  Psychiatric/Behavioral: Negative for agitation and behavioral problems.    Objective:  BP 116/81 (BP Location: Right Arm, Patient Position: Standing, Cuff Size: Large)   Pulse 77   Temp 98 F (36.7 C) (Oral)   Ht 5\' 8"  (1.727 m)   Wt 193 lb 12.8 oz (87.9 kg)   SpO2 98%   BMI 29.47 kg/m   BP/Weight 04/06/2016 04/01/2016 123XX123  Systolic BP 99991111 0000000 0000000  Diastolic BP 81 98 79  Wt. (Lbs) 193.8 196.5 192.2  BMI 29.47 29.88 29.22      Physical Exam  Constitutional: She is oriented to person, place, and time. She appears well-developed and well-nourished.  HENT:  Right Ear: External ear normal.  Left Ear: External ear normal.  Mouth/Throat: No oropharyngeal exudate.  Neck: No JVD present.  Cardiovascular: Normal rate, normal heart sounds and intact distal pulses.   No  murmur heard. Pulmonary/Chest: Effort normal and breath sounds normal. She has no wheezes. She has no rales. She exhibits no tenderness.  Abdominal: Soft. Bowel sounds are normal. She exhibits no distension and no mass. There is no tenderness.  Musculoskeletal: Normal range of motion.  Neurological: She is alert and oriented to person, place, and time.  Psychiatric: She has a normal mood and affect.      Assessment & Plan:   1. Systolic and diastolic CHF, acute on chronic (HCC) EF 25-30% from 2-D echo 05/2015 Euvolemic at this time Advised on daily weights, low sodium diet, restrict fluid intake to less than 2 L per day Continue Lasix, ARB, beta blocker  2. Essential hypertension Controlled - COMPLETE METABOLIC PANEL WITH GFR  3. Hyperlipidemia, unspecified hyperlipidemia type Remains on a statin  4. H/O noncompliance with medical treatment, presenting hazards to health Discussed implications of noncompliance and she is willing to work on being more compliant  5. Abnormal uterine bleeding History of menorrhagia from fibroids Mirena in place Patient encouraged to continue aspirin despite  spotting We'll reassess at next visit.  6. IUD (intrauterine device) in place   7. Alopecia - TSH   Meds ordered this encounter  Medications  . losartan (COZAAR) 100 MG tablet    Sig: Take 1 tablet (100 mg total) by mouth daily.    Dispense:  30 tablet    Refill:  3    Discontinue previous dose    Follow-up: Return in about 2 weeks (around 04/20/2016) for [facility] - Follow-up on abnormal uterine bleeding.   Arnoldo Morale MD

## 2016-04-12 ENCOUNTER — Telehealth: Payer: Self-pay

## 2016-04-12 NOTE — Telephone Encounter (Signed)
Writer called patient per Dr. Jarold Song to discuss lab results.  Patient stated understanding and will call for an appt with Dr. Adrian Blackwater.

## 2016-04-12 NOTE — Telephone Encounter (Signed)
-----   Message from Arnoldo Morale, MD sent at 04/07/2016  2:04 PM EST ----- Thyroid labs abnormal. Will need to repeat at her next office visit.

## 2016-04-14 ENCOUNTER — Telehealth: Payer: Self-pay

## 2016-04-14 NOTE — Telephone Encounter (Signed)
Attempted to contact the patient to check on her status. Call placed to # [phone] (H) and a HIPAA compliant voicemail message was left requesting a call back to # [phone] or [phone].

## 2016-04-19 ENCOUNTER — Telehealth: Payer: Self-pay

## 2016-04-19 ENCOUNTER — Ambulatory Visit: Payer: Self-pay | Admitting: Obstetrics & Gynecology

## 2016-04-19 NOTE — Telephone Encounter (Signed)
Call placed to the patient to check on her status. She said that she is doing " all right."  She noted that she can't find her scale as she was padlocked out of her house.  She said that she has been going to [facility] every day to be weighed. She said that her weight was " about 190" today and she has been up or down a pound every day. She noted that her goal is 170 lbs.  She explained that she is taking the lasix as directed - 40 mg tablets: 1.5 tablets in the morning and and an additional tablet in the evening if needed. She said that she needs to pick up more losartan. She ran out today because her dose had been increased.  Reminded her of the importance of adhering to her 2L FR/day.  She said that she is trying to comply   She confirmed her appointment on 04/26/16 @ 1545 with Dr Adrian Blackwater. Offered her the option of rescheduling with the [facility] ([facility])  as her last appointment was with the [facility];  but she said she wanted to keep the appointment with Dr Adrian Blackwater.   No other concerns/questions reported at this time.

## 2016-04-26 ENCOUNTER — Encounter: Payer: Self-pay | Admitting: Family Medicine

## 2016-04-26 ENCOUNTER — Ambulatory Visit: Payer: Medicaid Other | Attending: Family Medicine | Admitting: Family Medicine

## 2016-04-26 VITALS — BP 138/92 | HR 83 | Temp 98.2°F | Ht 68.0 in | Wt 194.4 lb

## 2016-04-26 DIAGNOSIS — E0590 Thyrotoxicosis, unspecified without thyrotoxic crisis or storm: Secondary | ICD-10-CM | POA: Insufficient documentation

## 2016-04-26 DIAGNOSIS — L659 Nonscarring hair loss, unspecified: Secondary | ICD-10-CM | POA: Insufficient documentation

## 2016-04-26 DIAGNOSIS — E079 Disorder of thyroid, unspecified: Secondary | ICD-10-CM | POA: Insufficient documentation

## 2016-04-26 DIAGNOSIS — Z7982 Long term (current) use of aspirin: Secondary | ICD-10-CM | POA: Insufficient documentation

## 2016-04-26 DIAGNOSIS — I5042 Chronic combined systolic (congestive) and diastolic (congestive) heart failure: Secondary | ICD-10-CM | POA: Diagnosis not present

## 2016-04-26 DIAGNOSIS — R7989 Other specified abnormal findings of blood chemistry: Secondary | ICD-10-CM | POA: Insufficient documentation

## 2016-04-26 DIAGNOSIS — Z1231 Encounter for screening mammogram for malignant neoplasm of breast: Secondary | ICD-10-CM

## 2016-04-26 DIAGNOSIS — I110 Hypertensive heart disease with heart failure: Secondary | ICD-10-CM | POA: Insufficient documentation

## 2016-04-26 DIAGNOSIS — M545 Low back pain: Secondary | ICD-10-CM

## 2016-04-26 DIAGNOSIS — Z87891 Personal history of nicotine dependence: Secondary | ICD-10-CM | POA: Diagnosis not present

## 2016-04-26 DIAGNOSIS — Z79899 Other long term (current) drug therapy: Secondary | ICD-10-CM | POA: Insufficient documentation

## 2016-04-26 DIAGNOSIS — R946 Abnormal results of thyroid function studies: Secondary | ICD-10-CM

## 2016-04-26 DIAGNOSIS — G8929 Other chronic pain: Secondary | ICD-10-CM

## 2016-04-26 MED ORDER — LOSARTAN POTASSIUM 100 MG PO TABS: 100.0000 mg | ORAL_TABLET | Freq: Every day | ORAL | 11 refills | Status: DC

## 2016-04-26 MED ORDER — BIOTIN 10000 MCG PO TABS: 1.0000 | ORAL_TABLET | Freq: Every day | ORAL | 11 refills | Status: DC

## 2016-04-26 MED ORDER — POTASSIUM CHLORIDE ER 20 MEQ PO TBCR: 1.0000 | EXTENDED_RELEASE_TABLET | Freq: Every day | ORAL | 5 refills | Status: DC

## 2016-04-26 MED ORDER — SIMVASTATIN 10 MG PO TABS: 10.0000 mg | ORAL_TABLET | Freq: Every evening | ORAL | 11 refills | Status: DC

## 2016-04-26 MED ORDER — CYCLOBENZAPRINE HCL 10 MG PO TABS: 10.0000 mg | ORAL_TABLET | Freq: Three times a day (TID) | ORAL | 0 refills | Status: DC | PRN

## 2016-04-26 MED ORDER — FUROSEMIDE 40 MG PO TABS: ORAL_TABLET | ORAL | 5 refills | Status: DC

## 2016-04-26 MED ORDER — METOPROLOL SUCCINATE ER 50 MG PO TB24: 50.0000 mg | ORAL_TABLET | Freq: Every day | ORAL | 11 refills | Status: DC

## 2016-04-26 MED FILL — SIMVASTATIN 10 MG TABLET: 10 | 30 days supply | Qty: 30 | Fill #0

## 2016-04-26 MED FILL — POTASSIUM CL ER 20 MEQ TAB: 20 | 30 days supply | Qty: 60 | Fill #0

## 2016-04-26 MED FILL — LOSARTAN POTASSIUM 100 MG T: 100 | 30 days supply | Qty: 30 | Fill #0

## 2016-04-26 MED FILL — CYCLOBENZAPRINE 10 MG TAB: 10 | 10 days supply | Qty: 30 | Fill #0

## 2016-04-26 MED FILL — FUROSEMIDE 40 MG TABLET: 40 | 30 days supply | Qty: 75 | Fill #0

## 2016-04-26 MED FILL — METOPROLOL SUCC ER 50 MG TA: 50 | 30 days supply | Qty: 30 | Fill #0

## 2016-04-26 NOTE — Assessment & Plan Note (Signed)
Low TSH Repeat TFTs

## 2016-04-26 NOTE — Assessment & Plan Note (Signed)
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

## 2016-04-26 NOTE — Patient Instructions (Addendum)
Chelsea Branch was seen today for thyroid problem.  Diagnoses and all orders for this visit:  Low TSH level -     TSH -     T3, Free -     T4, free  Chronic combined systolic and diastolic heart failure (HCC) -     losartan (COZAAR) 100 MG tablet; Take 1 tablet (100 mg total) by mouth daily. -     furosemide (LASIX) 40 MG tablet; Take 1.5 tablet (60 mg)by mouth in the AM and take an additional 1 tablet (40mg ) by mouth in the PM if necessary -     metoprolol succinate (TOPROL-XL) 50 MG 24 hr tablet; Take 1 tablet (50 mg total) by mouth daily. -     simvastatin (ZOCOR) 10 MG tablet; Take 1 tablet (10 mg total) by mouth every evening. -     Potassium Chloride ER 20 MEQ TBCR; Take 1 tablet by mouth daily. TAKE 1 extra tablet when you take extra Furosemide (Lasix)  Alopecia -     Biotin 10000 MCG TABS; Take 1 tablet by mouth daily.  Chronic bilateral low back pain without sciatica -     cyclobenzaprine (FLEXERIL) 10 MG tablet; Take 1 tablet (10 mg total) by mouth 3 (three) times daily as needed for muscle spasms.  Visit for screening mammogram -     MM DIGITAL SCREENING BILATERAL; Future   F/u in 2 weeks for pap smear   Dr. Adrian Blackwater    Back Exercises Introduction If you have pain in your back, do these exercises 2-3 times each day or as told by your doctor. When the pain goes away, do the exercises once each day, but repeat the steps more times for each exercise (do more repetitions). If you do not have pain in your back, do these exercises once each day or as told by your doctor. Exercises Single Knee to Chest  Do these steps 3-5 times in a row for each leg: 1. Lie on your back on a firm bed or the floor with your legs stretched out. 2. Bring one knee to your chest. 3. Hold your knee to your chest by grabbing your knee or thigh. 4. Pull on your knee until you feel a gentle stretch in your lower back. 5. Keep doing the stretch for 10-30 seconds. 6. Slowly let go of your leg and straighten  it. Pelvic Tilt  Do these steps 5-10 times in a row: 1. Lie on your back on a firm bed or the floor with your legs stretched out. 2. Bend your knees so they point up to the ceiling. Your feet should be flat on the floor. 3. Tighten your lower belly (abdomen) muscles to press your lower back against the floor. This will make your tailbone point up to the ceiling instead of pointing down to your feet or the floor. 4. Stay in this position for 5-10 seconds while you gently tighten your muscles and breathe evenly. Cat-Cow  Do these steps until your lower back bends more easily: 1. Get on your hands and knees on a firm surface. Keep your hands under your shoulders, and keep your knees under your hips. You may put padding under your knees. 2. Let your head hang down, and make your tailbone point down to the floor so your lower back is round like the back of a cat. 3. Stay in this position for 5 seconds. 4. Slowly lift your head and make your tailbone point up to the ceiling so your  back hangs low (sags) like the back of a cow. 5. Stay in this position for 5 seconds. Press-Ups  Do these steps 5-10 times in a row: 1. Lie on your belly (face-down) on the floor. 2. Place your hands near your head, about shoulder-width apart. 3. While you keep your back relaxed and keep your hips on the floor, slowly straighten your arms to raise the top half of your body and lift your shoulders. Do not use your back muscles. To make yourself more comfortable, you may change where you place your hands. 4. Stay in this position for 5 seconds. 5. Slowly return to lying flat on the floor. Bridges  Do these steps 10 times in a row: 1. Lie on your back on a firm surface. 2. Bend your knees so they point up to the ceiling. Your feet should be flat on the floor. 3. Tighten your butt muscles and lift your butt off of the floor until your waist is almost as high as your knees. If you do not feel the muscles working in your butt  and the back of your thighs, slide your feet 1-2 inches farther away from your butt. 4. Stay in this position for 3-5 seconds. 5. Slowly lower your butt to the floor, and let your butt muscles relax. If this exercise is too easy, try doing it with your arms crossed over your chest. Belly Crunches  Do these steps 5-10 times in a row: 1. Lie on your back on a firm bed or the floor with your legs stretched out. 2. Bend your knees so they point up to the ceiling. Your feet should be flat on the floor. 3. Cross your arms over your chest. 4. Tip your chin a little bit toward your chest but do not bend your neck. 5. Tighten your belly muscles and slowly raise your chest just enough to lift your shoulder blades a tiny bit off of the floor. 6. Slowly lower your chest and your head to the floor. Back Lifts  Do these steps 5-10 times in a row: 1. Lie on your belly (face-down) with your arms at your sides, and rest your forehead on the floor. 2. Tighten the muscles in your legs and your butt. 3. Slowly lift your chest off of the floor while you keep your hips on the floor. Keep the back of your head in line with the curve in your back. Look at the floor while you do this. 4. Stay in this position for 3-5 seconds. 5. Slowly lower your chest and your face to the floor. Contact a doctor if:  Your back pain gets a lot worse when you do an exercise.  Your back pain does not lessen 2 hours after you exercise. If you have any of these problems, stop doing the exercises. Do not do them again unless your doctor says it is okay. Get help right away if:  You have sudden, very bad back pain. If this happens, stop doing the exercises. Do not do them again unless your doctor says it is okay. This information is not intended to replace advice given to you by your health care provider. Make sure you discuss any questions you have with your health care provider. Document Released: 06/12/2010 Document Revised:  10/16/2015 Document Reviewed: 07/04/2014  2017 Elsevier

## 2016-04-26 NOTE — Progress Notes (Signed)
Subjective:  [name], female    DOB: November 17, 1965  Age: 50 y.o. MRN: [id number]  CC: Thyroid Problem   HPI Mammie Pauls has HTN, systolic CHF she presents for   1. Low TSH: noted on lab in 07/2013 and again in 03/2016. She admits trouble sleeping, hair loss, feeling hot. No weight loss or palpitations.   2. Back pain: low back pain this is chronic. Low back pain and stiffness that is midline and L sided. Pain exacerbated by sitting in one spot for prolonged period. She had a fall last year which exacerbated her pain. Flexeril helped last year. She request refill. She denies weakness or numbness in lower extremities. She denies fecal or urinary incontinence.   Social History  Substance Use Topics  . Smoking status: Former Smoker    Packs/day: 0.10    Years: 30.00    Types: Cigarettes    Quit date: 06/03/2015  . Smokeless tobacco: Never Used  . Alcohol use No    Outpatient Medications Prior to Visit  Medication Sig Dispense Refill  . aspirin 81 MG EC tablet Take 1 tablet (81 mg total) by mouth daily. 30 tablet 0  . furosemide (LASIX) 40 MG tablet Take 1.5 tablet (60 mg)by mouth in the AM and take an additional 1 tablet (40mg ) by mouth in the PM if necessary 75 tablet 1  . ibuprofen (ADVIL,MOTRIN) 600 MG tablet Take 1 tablet (600 mg total) by mouth every 6 (six) hours as needed. (Patient taking differently: Take 600 mg by mouth every 6 (six) hours as needed for moderate pain. ) 30 tablet 0  . losartan (COZAAR) 100 MG tablet Take 1 tablet (100 mg total) by mouth daily. 30 tablet 3  . metoprolol succinate (TOPROL-XL) 50 MG 24 hr tablet Take 1 tablet (50 mg total) by mouth daily. 30 tablet 1  . Potassium Chloride ER 20 MEQ TBCR Take 1 tablet by mouth daily. TAKE 1 extra tablet when you take extra Furosemide (Lasix) 60 tablet 1  . simvastatin (ZOCOR) 10 MG tablet Take 1 tablet (10 mg total) by mouth every evening. 30 tablet 1  . megestrol (MEGACE) 40 MG tablet TAKE 1/2 TABLET  BY MOUTH DAILY (Patient not taking: Reported on 04/26/2016) 30 tablet 0   No facility-administered medications prior to visit.     ROS Review of Systems  Constitutional: Negative for chills and fever.  Eyes: Negative for visual disturbance.  Respiratory: Negative for shortness of breath.   Cardiovascular: Negative for chest pain.  Gastrointestinal: Negative for abdominal pain and blood in stool.  Musculoskeletal: Positive for back pain. Negative for arthralgias.  Skin: Negative for rash.  Allergic/Immunologic: Negative for immunocompromised state.  Hematological: Negative for adenopathy. Does not bruise/bleed easily.  Psychiatric/Behavioral: Negative for dysphoric mood and suicidal ideas.   Objective:  BP (!) 138/92 (BP Location: Left Arm, Patient Position: Sitting, Cuff Size: Small)   Pulse 83   Temp 98.2 F (36.8 C) (Oral)   Ht 5\' 8"  (1.727 m)   Wt 194 lb 6.4 oz (88.2 kg)   SpO2 97%   BMI 29.56 kg/m   BP/Weight 04/26/2016 04/06/2016 [id number]  Systolic BP 0000000 [id number] 0000000  Diastolic BP 92 81 98  Wt. (Lbs) 194.4 193.8 196.5  BMI 29.56 29.47 29.88   Physical Exam  Constitutional: She is oriented to person, place, and time. She appears well-developed and well-nourished. No distress.  HENT:  Head: Normocephalic and atraumatic.  Cardiovascular: Normal rate, regular rhythm, normal heart sounds  and intact distal pulses.   Pulmonary/Chest: Effort normal and breath sounds normal.  Musculoskeletal: She exhibits no edema.  Back Exam: Back: Normal Curvature, no deformities or CVA tenderness  Paraspinal Tenderness: b/l lumbar   LE Strength 5/5  LE Sensation: in tact  LE Reflexes 2+ and symmetric  Straight leg raise: negative    Neurological: She is alert and oriented to person, place, and time.  Skin: Skin is warm and dry. No rash noted.  Psychiatric: She has a normal mood and affect.    Lab Results  Component Value Date   TSH 0.24 (L) 04/06/2016    Assessment & Plan:    Kyia was seen today for thyroid problem.  Diagnoses and all orders for this visit:  Low TSH level -     TSH -     T3, Free -     T4, free  Chronic combined systolic and diastolic heart failure (HCC) -     losartan (COZAAR) 100 MG tablet; Take 1 tablet (100 mg total) by mouth daily. -     furosemide (LASIX) 40 MG tablet; Take 1.5 tablet (60 mg)by mouth in the AM and take an additional 1 tablet (40mg ) by mouth in the PM if necessary -     metoprolol succinate (TOPROL-XL) 50 MG 24 hr tablet; Take 1 tablet (50 mg total) by mouth daily. -     simvastatin (ZOCOR) 10 MG tablet; Take 1 tablet (10 mg total) by mouth every evening. -     Potassium Chloride ER 20 MEQ TBCR; Take 1 tablet by mouth daily. TAKE 1 extra tablet when you take extra Furosemide (Lasix)  Alopecia -     Biotin 10000 MCG TABS; Take 1 tablet by mouth daily.  Chronic bilateral low back pain without sciatica -     cyclobenzaprine (FLEXERIL) 10 MG tablet; Take 1 tablet (10 mg total) by mouth 3 (three) times daily as needed for muscle spasms.  Visit for screening mammogram -     MM DIGITAL SCREENING BILATERAL; Future  There are no diagnoses linked to this encounter.  No orders of the defined types were placed in this encounter.   Follow-up: No Follow-up on file.   Boykin Nearing MD

## 2016-04-27 LAB — T3, FREE: T3 FREE: 3.4 pg/mL (ref 2.3–4.2)

## 2016-04-27 LAB — TSH: TSH: 0.25 m[IU]/L — AB

## 2016-04-27 LAB — T4, FREE: Free T4: 1.1 ng/dL (ref 0.8–1.8)

## 2016-04-28 ENCOUNTER — Other Ambulatory Visit (HOSPITAL_COMMUNITY): Payer: Self-pay

## 2016-04-28 ENCOUNTER — Telehealth: Payer: Self-pay

## 2016-04-28 NOTE — Telephone Encounter (Signed)
Pt was called and informed of lab results. 

## 2016-05-13 ENCOUNTER — Ambulatory Visit: Payer: Self-pay | Admitting: Obstetrics & Gynecology

## 2016-05-25 ENCOUNTER — Other Ambulatory Visit (HOSPITAL_COMMUNITY): Payer: Self-pay

## 2016-06-02 ENCOUNTER — Ambulatory Visit: Payer: Self-pay

## 2016-06-04 ENCOUNTER — Telehealth: Payer: Self-pay | Admitting: Physician Assistant

## 2016-06-04 MED FILL — METOPROLOL SUCC ER 50 MG TA: 50 | 30 days supply | Qty: 30 | Fill #1

## 2016-06-04 MED FILL — LOSARTAN POTASSIUM 100 MG T: 100 | 30 days supply | Qty: 30 | Fill #1

## 2016-06-04 NOTE — Telephone Encounter (Signed)
All medications are PCP filled, pt aware.

## 2016-06-04 NOTE — Telephone Encounter (Signed)
°*  STAT* If patient is at the pharmacy, call can be transferred to refill team.   1. Which medications need to be refilled? (please list name of each medication and dose if known) all medications  2. Which pharmacy/location (including street and city if local pharmacy) is medication to be sent to? [facility] 3. Do they need a 30 day or 90 day supply? [place]

## 2016-06-15 ENCOUNTER — Other Ambulatory Visit: Payer: Self-pay

## 2016-06-15 ENCOUNTER — Ambulatory Visit (HOSPITAL_COMMUNITY): Payer: Medicaid Other | Attending: Internal Medicine

## 2016-06-15 DIAGNOSIS — I5042 Chronic combined systolic (congestive) and diastolic (congestive) heart failure: Secondary | ICD-10-CM | POA: Insufficient documentation

## 2016-06-15 DIAGNOSIS — I110 Hypertensive heart disease with heart failure: Secondary | ICD-10-CM | POA: Diagnosis not present

## 2016-06-15 DIAGNOSIS — E785 Hyperlipidemia, unspecified: Secondary | ICD-10-CM | POA: Insufficient documentation

## 2016-06-15 DIAGNOSIS — Z8673 Personal history of transient ischemic attack (TIA), and cerebral infarction without residual deficits: Secondary | ICD-10-CM | POA: Diagnosis not present

## 2016-06-15 DIAGNOSIS — I252 Old myocardial infarction: Secondary | ICD-10-CM | POA: Insufficient documentation

## 2016-06-15 DIAGNOSIS — G4733 Obstructive sleep apnea (adult) (pediatric): Secondary | ICD-10-CM | POA: Diagnosis not present

## 2016-06-15 DIAGNOSIS — I428 Other cardiomyopathies: Secondary | ICD-10-CM | POA: Diagnosis not present

## 2016-06-28 MED FILL — SIMVASTATIN 10 MG TABLET: 10 | 30 days supply | Qty: 30 | Fill #1

## 2016-06-28 MED FILL — ?FUROSEMIDE 40 MG TABLET: 40 | 30 days supply | Qty: 75 | Fill #1

## 2016-07-01 ENCOUNTER — Ambulatory Visit (INDEPENDENT_AMBULATORY_CARE_PROVIDER_SITE_OTHER): Payer: Self-pay | Admitting: Internal Medicine

## 2016-07-01 VITALS — BP 130/96 | HR 91 | Ht 68.0 in | Wt 197.0 lb

## 2016-07-01 DIAGNOSIS — I428 Other cardiomyopathies: Secondary | ICD-10-CM

## 2016-07-01 DIAGNOSIS — I5042 Chronic combined systolic (congestive) and diastolic (congestive) heart failure: Secondary | ICD-10-CM

## 2016-07-01 MED ORDER — METOPROLOL SUCCINATE ER 50 MG PO TB24: 50.0000 mg | ORAL_TABLET | Freq: Every day | ORAL | 11 refills | Status: DC

## 2016-07-01 MED ORDER — SIMVASTATIN 10 MG PO TABS: 10.0000 mg | ORAL_TABLET | Freq: Every evening | ORAL | 11 refills | Status: DC

## 2016-07-01 MED ORDER — SACUBITRIL-VALSARTAN 49-51 MG PO TABS: 1.0000 | ORAL_TABLET | Freq: Two times a day (BID) | ORAL | 6 refills | Status: DC

## 2016-07-01 MED ORDER — FUROSEMIDE 40 MG PO TABS: ORAL_TABLET | ORAL | 11 refills | Status: DC

## 2016-07-01 NOTE — Patient Instructions (Signed)
Medication Instructions:    Your physician has recommended you make the following change in your medication: 1) STOP Losartan 2) START Entresto 49/51 mg twice a day -- start this medication night of 2/9  --- If you need a refill on your cardiac medications before your next appointment, please call your pharmacy. ---  Labwork:  None ordered  Testing/Procedures:  None ordered  Follow-Up:  Your physician recommends that you schedule a follow-up appointment in: 2 weeks with pharmacist about [name]   Your physician wants you to follow-up in: 6 months with Dr. Caryl Comes.  You will receive a reminder letter in the mail two months in advance. If you don't receive a letter, please call our office to schedule the follow-up appointment.  Thank you for choosing [facility]!!    Any Other Special Instructions Will Be Listed Below (If Applicable).

## 2016-07-01 NOTE — Progress Notes (Signed)
Patient Care Team: Boykin Nearing, MD as PCP - General (Family Medicine) Deboraha Sprang, MD as PCP - Cardiology (Cardiology)   HPI  Chelsea Branch is a 51 y.o. 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 She had recovery of LV function most recently in 2008. 2011 left ventricular function had decreased to 25%.  She has significant hypertension.   She was rehospitalized 3/15 for acute on chronic congestive heart failure. She was treated with IV diuresis. Echocardiogram demonstrated EF 25-30%.   . Echocardiogram 8/15 demonstrated normalization of LV systolic function  DATE TEST    1/17 Cath Normal CA   1/17    Echo   EF 25-30 %   1/18    Echo EF 30-35%      Hospitalized again 1/17 with congestive heart failure. Echocardiogram EF 25-30% LHC-no obstructive coronary disease EF 25%   Now with some PND and orthopnea without edema Also with exertional dyspnea No syncope   Asked to see now to consider ICD for primary prevention       Past Medical History:  Diagnosis Date  . Abnormal uterine bleeding   . Alopecia   . Anemia   . Angina pectoris with normal coronary arteriogram ([place]) 2017   Had + Troponin c/w ? NSTEMI due to A on C CHF  . 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 Chronic combined systolic and diastolic CHF (congestive heart failure) ([place])   . DYSLIPIDEMIA   . Fibroids   . H/O noncompliance with medical treatment, presenting hazards to health   . Hypertension   . Menorrhagia   . Nonischemic cardiomyopathy ([place]) 1994; 2017   a. iniatially ?2/2 peripartum in 1994 - improved by 2008 then worsening EF in 2011 back down to EF 25-30%. b. echo 01/21/14 showed mod LVH, [name]; [name], [name], [name],   . NSTEMI (non-ST elevated myocardial infarction) ([place]) 05/2015   Normal Coronaries.  . Peripartum cardiomyopathy 1994  . Sleep apnea 2015   CPAP 12/2013  . Stroke [facility]) 2011  . Tobacco abuse      Past Surgical History:  Procedure Laterality Date  . CARDIAC CATHETERIZATION N/A 06/16/2015   Procedure: Left Heart Cath and Coronary Angiography;  Surgeon: Jettie Booze, MD;  Location: [facility];  Service: Cardiovascular;  Laterality: N/A;  . [place]  . LOOP RECORDER IMPLANT  ~ 2000  . TIBIAL TUBERCLERPLASTY    . TUBAL LIGATION  1994    Current Outpatient Prescriptions  Medication Sig Dispense Refill  . aspirin 81 MG EC tablet Take 1 tablet (81 mg total) by mouth daily. 30 tablet 0  . Biotin 10000 MCG TABS Take 1 tablet by mouth daily. 30 tablet 11  . cyclobenzaprine (FLEXERIL) 10 MG tablet Take 1 tablet (10 mg total) by mouth 3 (three) times daily as needed for muscle spasms. 30 tablet 0  . furosemide (LASIX) 40 MG tablet Take 1.5 tablet (60 mg)by mouth in the AM and take an additional 1 tablet (40mg ) by mouth in the PM if necessary 75 tablet 5  . losartan (COZAAR) 100 MG tablet Take 1 tablet (100 mg total) by mouth daily. 30 tablet 11  . metoprolol succinate (TOPROL-XL) 50 MG 24 hr tablet Take 1 tablet (50 mg total) by mouth daily. 30 tablet 11  . Potassium Chloride ER 20 MEQ TBCR Take 1 tablet by mouth daily. TAKE 1 extra  tablet when you take extra Furosemide (Lasix) 60 tablet 5  . simvastatin (ZOCOR) 10 MG tablet Take 1 tablet (10 mg total) by mouth every evening. 30 tablet 11   No current facility-administered medications for this visit.     Allergies  Allergen Reactions  . Ace Inhibitors Other (See Comments)    REACTION: Cough    Review of Systems negative except from HPI and PMH  Physical Exam BP (!) 130/96   Pulse 91   Ht 5\' 8"  (1.727 m)   Wt 197 lb (89.4 kg)   SpO2 97%   BMI 29.95 kg/m  Well developed and well nourished in no acute distress HENT normal E scleral and icterus clear Neck Supple JVP 6-8  Clear to ausculation  *Regular rate and rhythm, no murmurs gallops or rub Soft with active bowel sounds No clubbing  cyanosis  Edema Alert and oriented, grossly normal motor and sensory function Skin Warm and Dry  ECG 24 March demonstrated sinus rhythm at 86 16/10/39 Nonspecific ST-T changes  Voltage consistent with LVH Assessment and  Plan  nonischemic cardiomyopathy  Congestive heart failure   Hypertension-resolved  Obstructive sleep apnea AHI 18    We will try and initiate Entresto. We will discontinue her losartan  We will plan to see her in about 6-8 weeks for up titration of medication. Once we have maximized this, we will repeat an ultrasound to see what happened LV function prior to making a decision about proceeding with device implantation  We will continue her diuretics. She is euvolemic today.

## 2016-07-15 NOTE — Progress Notes (Signed)
[name]                 DOB: 1965/07/23                      MRN: [id number]     HPI: Chelsea Branch is a 51 y.o. female referred by Dr. Caryl Comes to pharmacy clinic for HF medication optimization. PMH is significant for HFrEF with LVEF 30-35% on 06/10/16 echo, NSTEMI 05/2015, stroke in 2011, sleep apnea, tobacco abuse, HLD, HTN, and noncompliance. She was seen by Dr Caryl Comes recently to evaluate for ICD for primary prevention. Decision was made to stop losartan and initiate Entresto. Once Chelsea Branch is maximized, plan to repeat ultrasound and assess LV function prior to making a decision about device implantation.  Pt reports tolerating Entresto well, not as SOB walking to the bus stop. Notably upset and crying when she walks back to clinic for personal reasons having to do with her son/social service/transport. Took a 10 minute phone call during her visit. Pt inquires about checking her weight - encouraged her to buy a scale so that she can weigh herself daily. BP 145/100, BMET checked most recently 03/27/16 - SCr 0.82, K 4 (on K supplement) SCr 0.82, K 4 (03/27/16). Pt has Medicaid - no issue affording meds.  Current HTN meds: Entresto 49-51mg  BID, Toprol 50mg  daily, furosemide 60mg  AM and 40mg  if needed in the PM Previously tried: ACEi - cough BP goal: <130/66mmHg  Family History: Sister with HTN.  Social History: Former smoker for 30 years, quit 06/03/15. Denies alcohol and illicit drug use.  Wt Readings from Last 3 Encounters:  07/01/16 197 lb (89.4 kg)  04/26/16 194 lb 6.4 oz (88.2 kg)  04/06/16 193 lb 12.8 oz (87.9 kg)   BP Readings from Last 3 Encounters:  07/01/16 (!) 130/96  04/26/16 (!) 138/92  04/06/16 116/81   Pulse Readings from Last 3 Encounters:  07/01/16 91  04/26/16 83  04/06/16 77    Renal function: CrCl cannot be calculated (Patient's most recent lab result is older than the maximum 21 days allowed.).  Past Medical History:  Diagnosis Date  . Abnormal  uterine bleeding   . Alopecia   . Anemia   . Angina pectoris with normal coronary arteriogram ([place]) 2017   Had + Troponin c/w ? NSTEMI due to A on C CHF  . 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 Chronic combined systolic and diastolic CHF (congestive heart failure) ([place])   . DYSLIPIDEMIA   . Fibroids   . H/O noncompliance with medical treatment, presenting hazards to health   . Hypertension   . Menorrhagia   . Nonischemic cardiomyopathy ([place]) 1994; 2017   a. iniatially ?2/2 peripartum in 1994 - improved by 2008 then worsening EF in 2011 back down to EF 25-30%. b. echo 01/21/14 showed mod LVH, [name]; [name], [name], [name],   . NSTEMI (non-ST elevated myocardial infarction) ([place]) 05/2015   Normal Coronaries.  . Peripartum cardiomyopathy 1994  . Sleep apnea 2015   CPAP 12/2013  . Stroke [facility]) 2011  . Tobacco abuse     Current Outpatient Prescriptions on File Prior to Visit  Medication Sig Dispense Refill  . aspirin 81 MG EC tablet Take 1 tablet (81 mg total) by mouth daily. 30 tablet 0  . Biotin 10000 MCG TABS Take 1 tablet by mouth daily. 30 tablet 11  . cyclobenzaprine (FLEXERIL) 10 MG tablet Take 1  tablet (10 mg total) by mouth 3 (three) times daily as needed for muscle spasms. 30 tablet 0  . furosemide (LASIX) 40 MG tablet Take 1.5 tablet (60 mg)by mouth in the AM and take an additional 1 tablet (40mg ) by mouth in the PM if necessary 75 tablet 11  . metoprolol succinate (TOPROL-XL) 50 MG 24 hr tablet Take 1 tablet (50 mg total) by mouth daily. 30 tablet 11  . Potassium Chloride ER 20 MEQ TBCR Take 1 tablet by mouth daily. TAKE 1 extra tablet when you take extra Furosemide (Lasix) 60 tablet 5  . sacubitril-valsartan (ENTRESTO) 49-51 MG Take 1 tablet by mouth 2 (two) times daily. Start 07/02/16 evening dose 60 tablet 6  . simvastatin (ZOCOR) 10 MG tablet Take 1 tablet (10 mg total) by mouth every evening. 30 tablet 11   No current facility-administered  medications on file prior to visit.     Allergies  Allergen Reactions  . Ace Inhibitors Other (See Comments)    REACTION: Cough     Assessment/Plan:  1. HF medication optimization - BP elevated at 145/100 which gives room to optimize HF meds. HR 58 so will continue Toprol 50mg  daily. Will increase Entresto to 97-103mg  BID, start spironolactone 12.5mg  daily for 1 week with plan to increase to 25mg  daily at f/u, and stop potassium supplementation. BMET check today. Will f/u in clinic for BP and BMET in 1 week.  Plan for repeat ultrasound to assess LVEF and potential need for ICD once HF medications are optimized.   Megan E. Supple, PharmD, CPP, [place] [id number] [address] [place], [place] [address] Phone: [phone]; Fax: [phone] 07/16/2016 4:16 PM

## 2016-07-16 ENCOUNTER — Ambulatory Visit (INDEPENDENT_AMBULATORY_CARE_PROVIDER_SITE_OTHER): Payer: Medicaid Other | Admitting: Pharmacist

## 2016-07-16 VITALS — BP 145/100 | HR 58

## 2016-07-16 DIAGNOSIS — I5042 Chronic combined systolic (congestive) and diastolic (congestive) heart failure: Secondary | ICD-10-CM

## 2016-07-16 MED ORDER — SACUBITRIL-VALSARTAN 97-103 MG PO TABS: 1.0000 | ORAL_TABLET | Freq: Two times a day (BID) | ORAL | 11 refills | Status: DC

## 2016-07-16 MED ORDER — SPIRONOLACTONE 25 MG PO TABS: 25.0000 mg | ORAL_TABLET | Freq: Every day | ORAL | 11 refills | Status: DC

## 2016-07-16 MED ORDER — METOPROLOL SUCCINATE ER 50 MG PO TB24: 50.0000 mg | ORAL_TABLET | Freq: Every day | ORAL | 11 refills | Status: DC

## 2016-07-16 NOTE — Patient Instructions (Addendum)
Increase your Entresto to 97-103mg  twice a day. You can take 2 of your current tablets twice a day until you run out.  Start taking spironolactone 1/2 tablet daily for 1 week. We will plan to increase your dose to 1 tablet a day in 1 week.  Stop taking potassium supplement  Recheck blood pressure and labs in 1 week

## 2016-07-17 LAB — BASIC METABOLIC PANEL
BUN/Creatinine Ratio: 14 (ref 9–23)
BUN: 10 mg/dL (ref 6–24)
CALCIUM: 9.3 mg/dL (ref 8.7–10.2)
CHLORIDE: 99 mmol/L (ref 96–106)
CO2: 27 mmol/L (ref 18–29)
Creatinine, Ser: 0.73 mg/dL (ref 0.57–1.00)
GFR, EST AFRICAN AMERICAN: 110 mL/min/{1.73_m2} (ref >59)
GFR, EST NON AFRICAN AMERICAN: 96 mL/min/{1.73_m2} (ref >59)
Glucose: 76 mg/dL (ref 65–99)
POTASSIUM: 3.1 mmol/L — AB (ref 3.5–5.2)
SODIUM: 142 mmol/L (ref 134–144)

## 2016-07-21 ENCOUNTER — Telehealth: Payer: Self-pay | Admitting: Internal Medicine

## 2016-07-21 NOTE — Telephone Encounter (Signed)
PA [id number] for entresto 97-103mg  submitted to [facility].

## 2016-07-22 ENCOUNTER — Ambulatory Visit: Payer: Medicaid Other

## 2016-07-22 ENCOUNTER — Other Ambulatory Visit: Payer: Medicaid Other

## 2016-07-22 NOTE — Progress Notes (Deleted)
[name]                 DOB: 01/24/1966                      MRN: [id number]     HPI: Chelsea Branch is a 51 y.o. female patient of Dr. Caryl Comes to pharmacy clinic for HF medication optimization. PMH is significant for HFrEF with LVEF 30-35% on 06/10/16 echo, NSTEMI 05/2015, stroke in 2011, sleep apnea, tobacco abuse, HLD, HTN, and noncompliance. She was seen by Dr Caryl Comes recently to evaluate for ICD for primary prevention. Decision was made to stop losartan and initiate Entresto. Once [name] is maximized, plan to repeat ultrasound and assess LV function prior to making a decision about device implantation. She was recently seen by [name], PharmD and her [name] was increased to max 97-103mg  BID, she started spironolactone 12.5mg  daily (with plan to increase to 25mg  daily) and stopped her potassium supplement.   Today she reports   Current HTN meds: Entresto 97-103mg  BID, Toprol 50mg  daily, furosemide 60mg  AM and 40mg  if needed in the PM, spironolactone 12.5mg  daily  Previously tried: ACEi - cough BP goal: <130/56mmHg  Family History: Sister with HTN.  Social History: Former smoker for 30 years, quit 06/03/15. Denies alcohol and illicit drug use.  Wt Readings from Last 3 Encounters:  07/01/16 197 lb (89.4 kg)  04/26/16 194 lb 6.4 oz (88.2 kg)  04/06/16 193 lb 12.8 oz (87.9 kg)   BP Readings from Last 3 Encounters:  07/16/16 (!) 145/100  07/01/16 (!) 130/96  04/26/16 (!) 138/92   Pulse Readings from Last 3 Encounters:  07/16/16 (!) 58  07/01/16 91  04/26/16 83    Renal function: CrCl cannot be calculated (Unknown ideal weight.).  Past Medical History:  Diagnosis Date  . Abnormal uterine bleeding   . Alopecia   . Anemia   . Angina pectoris with normal coronary arteriogram ([place]) 2017   Had + Troponin c/w ? NSTEMI due to A on C CHF  . 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 Chronic combined systolic and diastolic CHF (congestive heart failure) ([place])   .  DYSLIPIDEMIA   . Fibroids   . H/O noncompliance with medical treatment, presenting hazards to health   . Hypertension   . Menorrhagia   . Nonischemic cardiomyopathy ([place]) 1994; 2017   a. iniatially ?2/2 peripartum in 1994 - improved by 2008 then worsening EF in 2011 back down to EF 25-30%. b. echo 01/21/14 showed mod LVH, [name]; [name], [name], [name],   . NSTEMI (non-ST elevated myocardial infarction) ([place]) 05/2015   Normal Coronaries.  . Peripartum cardiomyopathy 1994  . Sleep apnea 2015   CPAP 12/2013  . Stroke [facility]) 2011  . Tobacco abuse     Current Outpatient Prescriptions on File Prior to Visit  Medication Sig Dispense Refill  . aspirin 81 MG EC tablet Take 1 tablet (81 mg total) by mouth daily. 30 tablet 0  . Biotin 10000 MCG TABS Take 1 tablet by mouth daily. 30 tablet 11  . cyclobenzaprine (FLEXERIL) 10 MG tablet Take 1 tablet (10 mg total) by mouth 3 (three) times daily as needed for muscle spasms. 30 tablet 0  . furosemide (LASIX) 40 MG tablet Take 1.5 tablet (60 mg)by mouth in the AM and take an additional 1 tablet (40mg ) by mouth in the PM if necessary 75 tablet 11  . metoprolol succinate (  TOPROL-XL) 50 MG 24 hr tablet Take 1 tablet (50 mg total) by mouth daily. 30 tablet 11  . Potassium Chloride ER 20 MEQ TBCR Take 1 tablet by mouth daily. TAKE 1 extra tablet when you take extra Furosemide (Lasix) 60 tablet 5  . sacubitril-valsartan (ENTRESTO) 97-103 MG Take 1 tablet by mouth 2 (two) times daily. 60 tablet 11  . simvastatin (ZOCOR) 10 MG tablet Take 1 tablet (10 mg total) by mouth every evening. 30 tablet 11  . spironolactone (ALDACTONE) 25 MG tablet Take 1 tablet (25 mg total) by mouth daily. 30 tablet 11   No current facility-administered medications on file prior to visit.     Allergies  Allergen Reactions  . Ace Inhibitors Other (See Comments)    REACTION: Cough    There were no vitals taken for this  visit.   Assessment/Plan: Hypertension: BMET today after medication changes.    Thank you, [name]. Patterson Hammersmith, [facility]  07/22/2016 12:28 PM

## 2016-07-23 ENCOUNTER — Telehealth: Payer: Self-pay | Admitting: *Deleted

## 2016-07-23 DIAGNOSIS — E876 Hypokalemia: Secondary | ICD-10-CM | POA: Insufficient documentation

## 2016-07-23 HISTORY — DX: Hypokalemia: E87.6

## 2016-07-23 MED ORDER — POTASSIUM CHLORIDE CRYS ER 20 MEQ PO TBCR: EXTENDED_RELEASE_TABLET | ORAL | 0 refills | Status: DC

## 2016-07-23 NOTE — Telephone Encounter (Signed)
-----   Message from Deboraha Sprang, MD sent at 07/23/2016  1:42 PM EST ----- Please Inform Patient that labs are normal x K is low Have her take 2 extra Potassium today and 2 extra tomorrow and add an extra potassium daily Recheck bmet in 2 weeks Thanks

## 2016-07-23 NOTE — Telephone Encounter (Signed)
Spoke with the pt and informed her that per Dr Caryl Comes, her labs were normal, but her K level was low.   Informed the pt that per Dr Caryl Comes, he recommends that  the pt take 2 extra potassium today (to total 60 mEq po daily) and tomorrow, then add an extra K daily thereafter (to total 40 mEq po daily), then come in for a repeat bmet in 2 weeks.   Informed the pt that she needs to take 3 tabs today and tomorrow of her 20 mEq K-durs, to total 60, and then take 2 tabs of the 20 mEq tablets to total 40 mEq po daily, thereafter.   Scheduled the pt a lab appt to recheck a bmet on 08/06/16.   Pt verbalized understanding and agrees with this plan.

## 2016-07-23 NOTE — Telephone Encounter (Signed)
Chelsea Branch has been approved, [id number]. Pt aware.

## 2016-08-06 ENCOUNTER — Other Ambulatory Visit: Payer: Medicaid Other

## 2016-08-09 ENCOUNTER — Ambulatory Visit: Payer: Medicaid Other

## 2016-08-09 ENCOUNTER — Other Ambulatory Visit: Payer: Medicaid Other

## 2016-08-09 NOTE — Progress Notes (Deleted)
[name]                 DOB: 1965-12-19                      MRN: [id number]     HPI: Chelsea Branch is a 51 y.o. female referred by Dr. Caryl Comes to pharmacy clinic for HF medication optimization. PMH is significant for HFrEF with LVEF 30-35% on 06/10/16 echo, NSTEMI 05/2015, stroke in 2011, sleep apnea, tobacco abuse, HLD, HTN, and noncompliance. She was seen by Dr Caryl Comes recently to evaluate for ICD for primary prevention. Decision was made to stop losartan and initiate Entresto. Once [name] is maximized, plan to repeat ultrasound and assess LV function prior to making a decision about device implantation. At last visit, Entresto dose was increased and spironolactone was started. K was also low at 3.1 and per Dr Caryl Comes, pt was to increase KDur from 65meq to 6meq daily. Pt did not show for her f/u appt or labs the following week.  Check BMET today Tolerating higher Entresto and spiro well? Inc K?  Pt inquires about checking her weight - encouraged her to buy a scale so that she can weigh herself daily. BP 145/100, BMET checked most recently 03/27/16 - SCr 0.82, K 4 (on K supplement) SCr 0.82, K 4 (03/27/16). Pt has Medicaid - no issue affording meds.  Current HTN meds: Entresto 97-103mg  BID, Toprol 50mg  daily, spironolactone 12.5mg  daily, furosemide 60mg  AM and 40mg  if needed in the PM Previously tried: ACEi - cough BP goal: <130/38mmHg  Family History: Sister with HTN.  Social History: Former smoker for 30 years, quit 06/03/15. Denies alcohol and illicit drug use.  Wt Readings from Last 3 Encounters:  07/01/16 197 lb (89.4 kg)  04/26/16 194 lb 6.4 oz (88.2 kg)  04/06/16 193 lb 12.8 oz (87.9 kg)   BP Readings from Last 3 Encounters:  07/16/16 (!) 145/100  07/01/16 (!) 130/96  04/26/16 (!) 138/92   Pulse Readings from Last 3 Encounters:  07/16/16 (!) 58  07/01/16 91  04/26/16 83    Renal function: CrCl cannot be calculated (Patient's most recent lab result is older than  the maximum 21 days allowed.).  Past Medical History:  Diagnosis Date  . Abnormal uterine bleeding   . Alopecia   . Anemia   . Angina pectoris with normal coronary arteriogram ([place]) 2017   Had + Troponin c/w ? NSTEMI due to A on C CHF  . 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 Chronic combined systolic and diastolic CHF (congestive heart failure) ([place])   . DYSLIPIDEMIA   . Fibroids   . H/O noncompliance with medical treatment, presenting hazards to health   . Hypertension   . Menorrhagia   . Nonischemic cardiomyopathy ([place]) 1994; 2017   a. iniatially ?2/2 peripartum in 1994 - improved by 2008 then worsening EF in 2011 back down to EF 25-30%. b. echo 01/21/14 showed mod LVH, [name]; [name], [name], [name],   . NSTEMI (non-ST elevated myocardial infarction) ([place]) 05/2015   Normal Coronaries.  . Peripartum cardiomyopathy 1994  . Sleep apnea 2015   CPAP 12/2013  . Stroke [facility]) 2011  . Tobacco abuse     Current Outpatient Prescriptions on File Prior to Visit  Medication Sig Dispense Refill  . aspirin 81 MG EC tablet Take 1 tablet (81 mg total) by mouth daily. 30 tablet 0  . Biotin 10000 MCG  TABS Take 1 tablet by mouth daily. 30 tablet 11  . cyclobenzaprine (FLEXERIL) 10 MG tablet Take 1 tablet (10 mg total) by mouth 3 (three) times daily as needed for muscle spasms. 30 tablet 0  . furosemide (LASIX) 40 MG tablet Take 1.5 tablet (60 mg)by mouth in the AM and take an additional 1 tablet (40mg ) by mouth in the PM if necessary 75 tablet 11  . metoprolol succinate (TOPROL-XL) 50 MG 24 hr tablet Take 1 tablet (50 mg total) by mouth daily. 30 tablet 11  . potassium chloride SA (K-DUR,KLOR-CON) 20 MEQ tablet Take 3 tablets (60 mEq total) by mouth daily today 07/23/16 and tomorrow 07/24/16, then take 2 tablets (40 mEq total) by mouth daily thereafter. 66 tablet 0  . sacubitril-valsartan (ENTRESTO) 97-103 MG Take 1 tablet by mouth 2 (two) times daily. 60 tablet 11  .  simvastatin (ZOCOR) 10 MG tablet Take 1 tablet (10 mg total) by mouth every evening. 30 tablet 11  . spironolactone (ALDACTONE) 25 MG tablet Take 1 tablet (25 mg total) by mouth daily. 30 tablet 11   No current facility-administered medications on file prior to visit.     Allergies  Allergen Reactions  . Ace Inhibitors Other (See Comments)    REACTION: Cough     Assessment/Plan:  1. HF medication optimization - BP elevated at 145/100 which gives room to optimize HF meds. HR 58 so will continue Toprol 50mg  daily. Will increase Entresto to 97-103mg  BID, start spironolactone 12.5mg  daily for 1 week with plan to increase to 25mg  daily at f/u, and stop potassium supplementation. BMET check today. Will f/u in clinic for BP and BMET in 1 week.  Plan for repeat ultrasound to assess LVEF and potential need for ICD once HF medications are optimized.

## 2016-08-25 ENCOUNTER — Other Ambulatory Visit: Payer: Self-pay | Admitting: Internal Medicine

## 2016-08-26 NOTE — Progress Notes (Signed)
Patient ID: Mindee Robledo                 DOB: 04-04-1966                      MRN: [id number]     HPI: Wafa Martes is a 51 y.o. female patient of Dr. Caryl Comes to pharmacy clinic for HF medication optimization. PMH is significant for HFrEF with LVEF 30-35% on 06/10/16 echo, NSTEMI 05/2015, stroke in 2011, sleep apnea, tobacco abuse, HLD, HTN, and noncompliance. She was seen by Dr Caryl Comes recently to evaluate for ICD for primary prevention. Decision was made to stop losartan and initiate Entresto. Once [name] is maximized, plan to repeat ultrasound and assess LV function prior to making a decision about device implantation. At her most recent OV, her [name] was increased to 97-103mg  BID and she was started on spironolactone 12.5mg  daily with plans to increase to 25mg . Her potassium supplement was discontinued.   She presents today for follow up. She reports doing well since her most recent visit. She has been taking the furosemide extra evening dose almost every evening as she has been experiencing swelling still.  She has been taking a whole tablet (25mg ) of the spironolactone.    She also reports that she has been more hungry and she has been tired more since her last visit in HTN clinic.    Current HTN meds: Entresto 97-103mg  BID, Toprol 50mg  daily, furosemide 60mg  AM and 40mg  if needed in the PM, spironolactone 25mg  daily Previously tried: ACEi - cough BP goal: <130/1mmHg  Family History: Sister with HTN.  Social History: Former smoker for 30 years, quit 06/03/15. Denies alcohol and illicit drug use.  Diet: She uses [employer] and cooks with mostly fresh foods. She does admit that she eats [employer] a decent amount. Discussed that this is likely higher in sodium.   Home BP readings: No home cuff.   Wt Readings from Last 3 Encounters:  07/01/16 197 lb (89.4 kg)  04/26/16 194 lb 6.4 oz (88.2 kg)  04/06/16 193 lb 12.8 oz (87.9 kg)   BP Readings from Last 3 Encounters:  08/30/16 102/64    07/16/16 (!) 145/100  07/01/16 (!) 130/96   Pulse Readings from Last 3 Encounters:  08/30/16 78  07/16/16 (!) 58  07/01/16 91    Renal function: CrCl cannot be calculated (Patient's most recent lab result is older than the maximum 21 days allowed.).  Past Medical History:  Diagnosis Date  . Abnormal uterine bleeding   . Alopecia   . Anemia   . Angina pectoris with normal coronary arteriogram ([place]) 2017   Had + Troponin c/w ? NSTEMI due to A on C CHF  . 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 Chronic combined systolic and diastolic CHF (congestive heart failure) ([place])   . DYSLIPIDEMIA   . Fibroids   . H/O noncompliance with medical treatment, presenting hazards to health   . Hypertension   . Menorrhagia   . Nonischemic cardiomyopathy ([place]) 1994; 2017   a. iniatially ?2/2 peripartum in 1994 - improved by 2008 then worsening EF in 2011 back down to EF 25-30%. b. echo 01/21/14 showed mod LVH, [name]; [name], [name], [name],   . NSTEMI (non-ST elevated myocardial infarction) ([place]) 05/2015   Normal Coronaries.  . Peripartum cardiomyopathy 1994  . Sleep apnea 2015   CPAP 12/2013  . Stroke [facility]) 2011  . Tobacco abuse  Current Outpatient Prescriptions on File Prior to Visit  Medication Sig Dispense Refill  . aspirin 81 MG EC tablet Take 1 tablet (81 mg total) by mouth daily. 30 tablet 0  . Biotin 10000 MCG TABS Take 1 tablet by mouth daily. 30 tablet 11  . cyclobenzaprine (FLEXERIL) 10 MG tablet Take 1 tablet (10 mg total) by mouth 3 (three) times daily as needed for muscle spasms. 30 tablet 0  . furosemide (LASIX) 40 MG tablet Take 1.5 tablet (60 mg)by mouth in the AM and take an additional 1 tablet (40mg ) by mouth in the PM if necessary 75 tablet 11  . metoprolol succinate (TOPROL-XL) 50 MG 24 hr tablet Take 1 tablet (50 mg total) by mouth daily. 30 tablet 11  . potassium chloride SA (KLOR-CON M20) 20 MEQ tablet Take 1 tablet (20 mEq total) by mouth  2 (two) times daily. 60 tablet 0  . sacubitril-valsartan (ENTRESTO) 97-103 MG Take 1 tablet by mouth 2 (two) times daily. 60 tablet 11  . simvastatin (ZOCOR) 10 MG tablet Take 1 tablet (10 mg total) by mouth every evening. 30 tablet 11   No current facility-administered medications on file prior to visit.     Allergies  Allergen Reactions  . Ace Inhibitors Other (See Comments)    REACTION: Cough    Blood pressure 102/64, pulse 78.   Assessment/Plan: Hypertension: BMET today after increase Entresto and start spironolactone. It is possible her body has not accommodated to the lower pressures and this is why she has been lethargic. I will have her take the intended dose of spironolactone of 12.5mg  daily and see if this improves her symptoms. Will await BMET results for additional adjustments. BMET returned WNL. Follow up in HTN clinic in 3 weeks.    Thank you, [name]. Patterson Hammersmith, [facility]  08/30/2016 4:15 PM

## 2016-08-27 ENCOUNTER — Ambulatory Visit: Payer: Medicaid Other | Admitting: Obstetrics & Gynecology

## 2016-08-30 ENCOUNTER — Encounter (INDEPENDENT_AMBULATORY_CARE_PROVIDER_SITE_OTHER): Payer: Self-pay

## 2016-08-30 ENCOUNTER — Ambulatory Visit (INDEPENDENT_AMBULATORY_CARE_PROVIDER_SITE_OTHER): Payer: Medicaid Other | Admitting: Pharmacist

## 2016-08-30 ENCOUNTER — Other Ambulatory Visit: Payer: Medicaid Other

## 2016-08-30 ENCOUNTER — Telehealth: Payer: Self-pay

## 2016-08-30 VITALS — BP 102/64 | HR 78

## 2016-08-30 DIAGNOSIS — I5042 Chronic combined systolic (congestive) and diastolic (congestive) heart failure: Secondary | ICD-10-CM | POA: Diagnosis not present

## 2016-08-30 DIAGNOSIS — I10 Essential (primary) hypertension: Secondary | ICD-10-CM

## 2016-08-30 NOTE — Patient Instructions (Addendum)
Return for a follow up appointment in 3 weeks  Check your blood pressure at home daily (if able) and keep record of the readings.  Take your BP meds as follows: CHANGE spironolactone to 12.5mg  daily (only 1/2 tablet of your current supply)  CONTINUE Entresto 97/103mg  twice daily Furosemide 60mg  in the morning and 40mg  in the evening if need Metoprolol XL (Toprol) 50mg  daily    Bring all of your meds, your BP cuff and your record of home blood pressures to your next appointment.  Exercise as you're able, try to walk approximately 30 minutes per day.  Keep salt intake to a minimum, especially watch canned and prepared boxed foods.  Eat more fresh fruits and vegetables and fewer canned items.  Avoid eating in fast food restaurants.    HOW TO TAKE YOUR BLOOD PRESSURE: . Rest 5 minutes before taking your blood pressure. .  Don't smoke or drink caffeinated beverages for at least 30 minutes before. . Take your blood pressure before (not after) you eat. . Sit comfortably with your back supported and both feet on the floor (don't cross your legs). . Elevate your arm to heart level on a table or a desk. . Use the proper sized cuff. It should fit smoothly and snugly around your bare upper arm. There should be enough room to slip a fingertip under the cuff. The bottom edge of the cuff should be 1 inch above the crease of the elbow. . Ideally, take 3 measurements at one sitting and record the average.

## 2016-08-30 NOTE — Telephone Encounter (Signed)
Called to find out if pt would like colonoscopy scheduled

## 2016-08-31 LAB — BASIC METABOLIC PANEL
BUN / CREAT RATIO: 14 (ref 9–23)
BUN: 10 mg/dL (ref 6–24)
CO2: 26 mmol/L (ref 18–29)
CREATININE: 0.71 mg/dL (ref 0.57–1.00)
Calcium: 9.3 mg/dL (ref 8.7–10.2)
Chloride: 101 mmol/L (ref 96–106)
GFR, EST AFRICAN AMERICAN: 114 mL/min/{1.73_m2} (ref >59)
GFR, EST NON AFRICAN AMERICAN: 99 mL/min/{1.73_m2} (ref >59)
GLUCOSE: 162 mg/dL — AB (ref 65–99)
Potassium: 3.8 mmol/L (ref 3.5–5.2)
SODIUM: 140 mmol/L (ref 134–144)

## 2016-09-07 ENCOUNTER — Ambulatory Visit: Payer: Medicaid Other | Admitting: Obstetrics & Gynecology

## 2016-09-15 NOTE — Telephone Encounter (Signed)
Closing open note from 2016

## 2016-09-20 ENCOUNTER — Ambulatory Visit: Payer: Medicaid Other

## 2016-09-20 ENCOUNTER — Ambulatory Visit (INDEPENDENT_AMBULATORY_CARE_PROVIDER_SITE_OTHER): Payer: Medicaid Other | Admitting: Pharmacist

## 2016-09-20 VITALS — BP 110/78 | HR 81

## 2016-09-20 DIAGNOSIS — I10 Essential (primary) hypertension: Secondary | ICD-10-CM

## 2016-09-20 NOTE — Progress Notes (Signed)
[name]                 DOB: 09-20-1965                      MRN: [id number]     HPI: Chelsea Branch is a 51 y.o. female patient of Dr. Caryl Comes to pharmacy clinic for HF medication optimization. PMH is significant for HFrEF with LVEF 30-35% on 06/10/16 echo, NSTEMI 05/2015, stroke in 2011, sleep apnea, tobacco abuse, HLD, HTN, and noncompliance. She was seen by Dr Caryl Comes recently to evaluate for ICD for primary prevention. Decision was made to stop losartan and initiate Entresto. Once [name] is maximized, plan to repeat ultrasound and assess LV function prior to making a decision about device implantation. At her most recent OV, she was found to be taking spironolactone 25mg  daily and this was reduced to the intended dose of 12.5mg  daily as she was quite lethargic.   She presents today for follow up. Today she presents stating that she has been taking 25mg  spironolactone despite instructions at last visit. She has not had any issues with dizziness since her last visit with HTN clinic. She does report increased appetite, which she now is attributing to her IUD (which is placed for fibroids). She also reports being more tired than usual, though she does admit this has been going on for quite some time now.   Current HTN meds: Entresto 97-103mg  BID, Toprol 50mg  daily, furosemide 60mg  AM and 40mg  if needed in the PM, spironolactone 25mg  daily Previously tried: ACEi - cough BP goal: <130/63mmHg  Family History: Sister with HTN.  Social History: Former smoker for 30 years, quit 06/03/15. Denies alcohol and illicit drug use.  Diet: She uses [employer] and cooks with mostly fresh foods. She does admit that she eats [employer] a decent amount. Discussed that this is likely higher in sodium.   Home BP readings: No home cuff.   Wt Readings from Last 3 Encounters:  07/01/16 197 lb (89.4 kg)  04/26/16 194 lb 6.4 oz (88.2 kg)  04/06/16 193 lb 12.8 oz (87.9 kg)   BP Readings from Last 3 Encounters:   09/20/16 110/78  08/30/16 102/64  07/16/16 (!) 145/100   Pulse Readings from Last 3 Encounters:  09/20/16 81  08/30/16 78  07/16/16 (!) 58    Renal function: CrCl cannot be calculated (Unknown ideal weight.).  Past Medical History:  Diagnosis Date  . Abnormal uterine bleeding   . Alopecia   . Anemia   . Angina pectoris with normal coronary arteriogram ([place]) 2017   Had + Troponin c/w ? NSTEMI due to A on C CHF  . 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 Chronic combined systolic and diastolic CHF (congestive heart failure) ([place])   . DYSLIPIDEMIA   . Fibroids   . H/O noncompliance with medical treatment, presenting hazards to health   . Hypertension   . Menorrhagia   . Nonischemic cardiomyopathy ([place]) 1994; 2017   a. iniatially ?2/2 peripartum in 1994 - improved by 2008 then worsening EF in 2011 back down to EF 25-30%. b. echo 01/21/14 showed mod LVH, [name]; [name], [name], [name],   . NSTEMI (non-ST elevated myocardial infarction) ([place]) 05/2015   Normal Coronaries.  . Peripartum cardiomyopathy 1994  . Sleep apnea 2015   CPAP 12/2013  . Stroke [facility]) 2011  . Tobacco abuse     Current Outpatient Prescriptions on File  Prior to Visit  Medication Sig Dispense Refill  . aspirin 81 MG EC tablet Take 1 tablet (81 mg total) by mouth daily. 30 tablet 0  . Biotin 10000 MCG TABS Take 1 tablet by mouth daily. 30 tablet 11  . cyclobenzaprine (FLEXERIL) 10 MG tablet Take 1 tablet (10 mg total) by mouth 3 (three) times daily as needed for muscle spasms. 30 tablet 0  . furosemide (LASIX) 40 MG tablet Take 1.5 tablet (60 mg)by mouth in the AM and take an additional 1 tablet (40mg ) by mouth in the PM if necessary 75 tablet 11  . metoprolol succinate (TOPROL-XL) 50 MG 24 hr tablet Take 1 tablet (50 mg total) by mouth daily. 30 tablet 11  . potassium chloride SA (KLOR-CON M20) 20 MEQ tablet Take 1 tablet (20 mEq total) by mouth 2 (two) times daily. 60 tablet 0  .  sacubitril-valsartan (ENTRESTO) 97-103 MG Take 1 tablet by mouth 2 (two) times daily. 60 tablet 11  . simvastatin (ZOCOR) 10 MG tablet Take 1 tablet (10 mg total) by mouth every evening. 30 tablet 11  . spironolactone (ALDACTONE) 25 MG tablet Take 0.5 tablets (12.5 mg total) by mouth daily. 30 tablet 11   No current facility-administered medications on file prior to visit.     Allergies  Allergen Reactions  . Ace Inhibitors Other (See Comments)    REACTION: Cough    Blood pressure 110/78, pulse 81.   Assessment/Plan: Hypertension: BP at goal. Labs all returned WNL. BP is low to normal today. Continue all medications as prescribed. Could consider an increase in Toprol given HR in 80s, but will defer this at this time due to her lethargy and previously her HR down into 50s on same medication regimen. Follow up with Dr. Caryl Comes as scheduled and pharmacy clinic as needed for additional medication optimization.    Thank you, [name]. Patterson Hammersmith, [facility]  09/20/2016 3:08 PM

## 2016-09-20 NOTE — Patient Instructions (Addendum)
Continue all medications as prescribed.   Please call the clinic if you have any questions or concerns about the medications.

## 2016-09-21 ENCOUNTER — Encounter: Payer: Self-pay | Admitting: Pharmacist

## 2016-09-21 LAB — BASIC METABOLIC PANEL
BUN / CREAT RATIO: 11 (ref 9–23)
BUN: 9 mg/dL (ref 6–24)
CALCIUM: 9.5 mg/dL (ref 8.7–10.2)
CHLORIDE: 101 mmol/L (ref 96–106)
CO2: 27 mmol/L (ref 18–29)
Creatinine, Ser: 0.83 mg/dL (ref 0.57–1.00)
GFR calc Af Amer: 94 mL/min/{1.73_m2} (ref >59)
GFR calc non Af Amer: 82 mL/min/{1.73_m2} (ref >59)
GLUCOSE: 91 mg/dL (ref 65–99)
Potassium: 4 mmol/L (ref 3.5–5.2)
Sodium: 141 mmol/L (ref 134–144)

## 2016-10-06 ENCOUNTER — Encounter: Payer: Self-pay | Admitting: Family Medicine

## 2016-10-17 ENCOUNTER — Other Ambulatory Visit: Payer: Self-pay | Admitting: Internal Medicine

## 2016-11-02 IMAGING — DX DG CHEST 2V
2 series · 2 of 2 positions shown · non-contrast
Comparison: Radiograph dated 11/02/2014

CLINICAL DATA: 49-year-old female with productive cough

EXAM:
CHEST  2 VIEW

[chest pa]
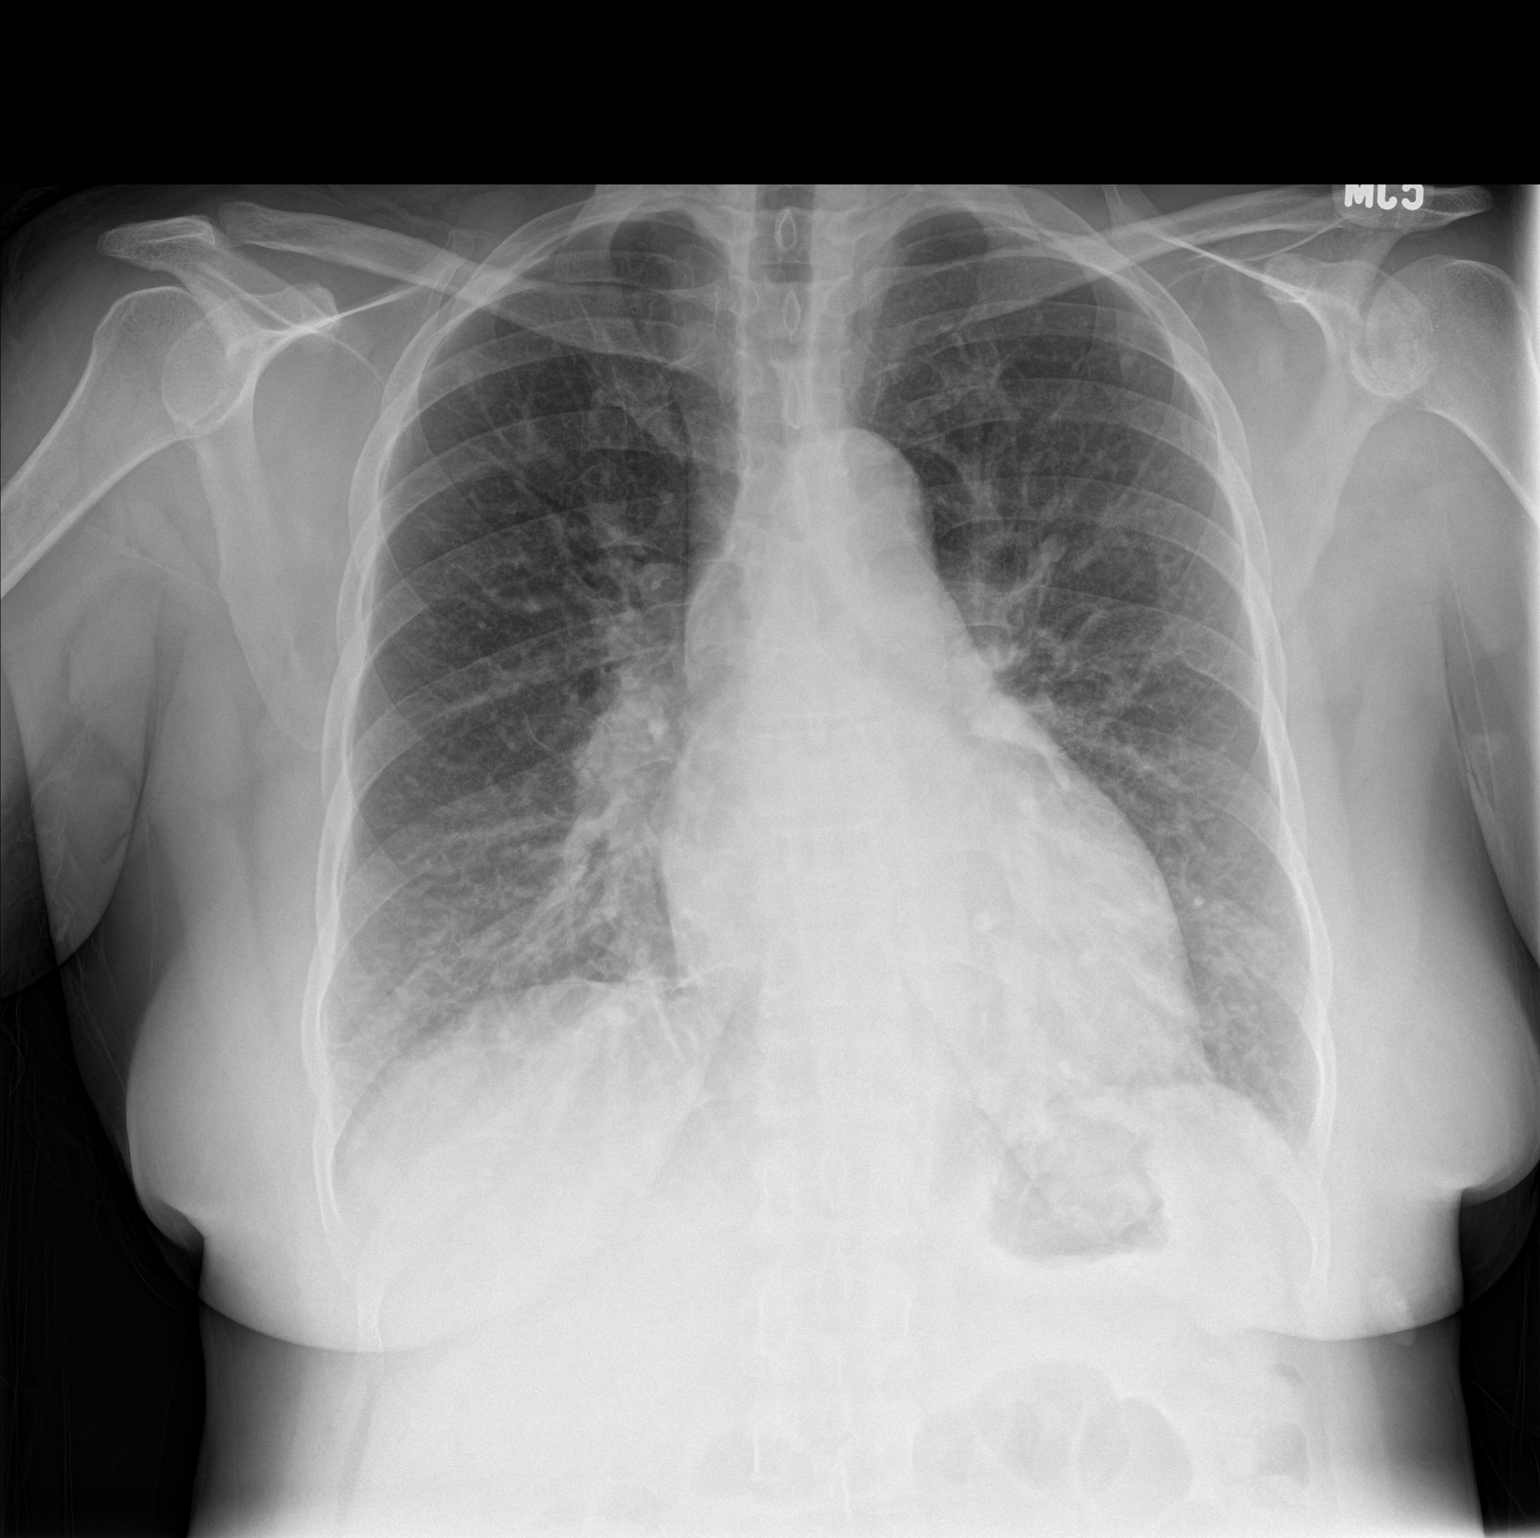

[chest lat]
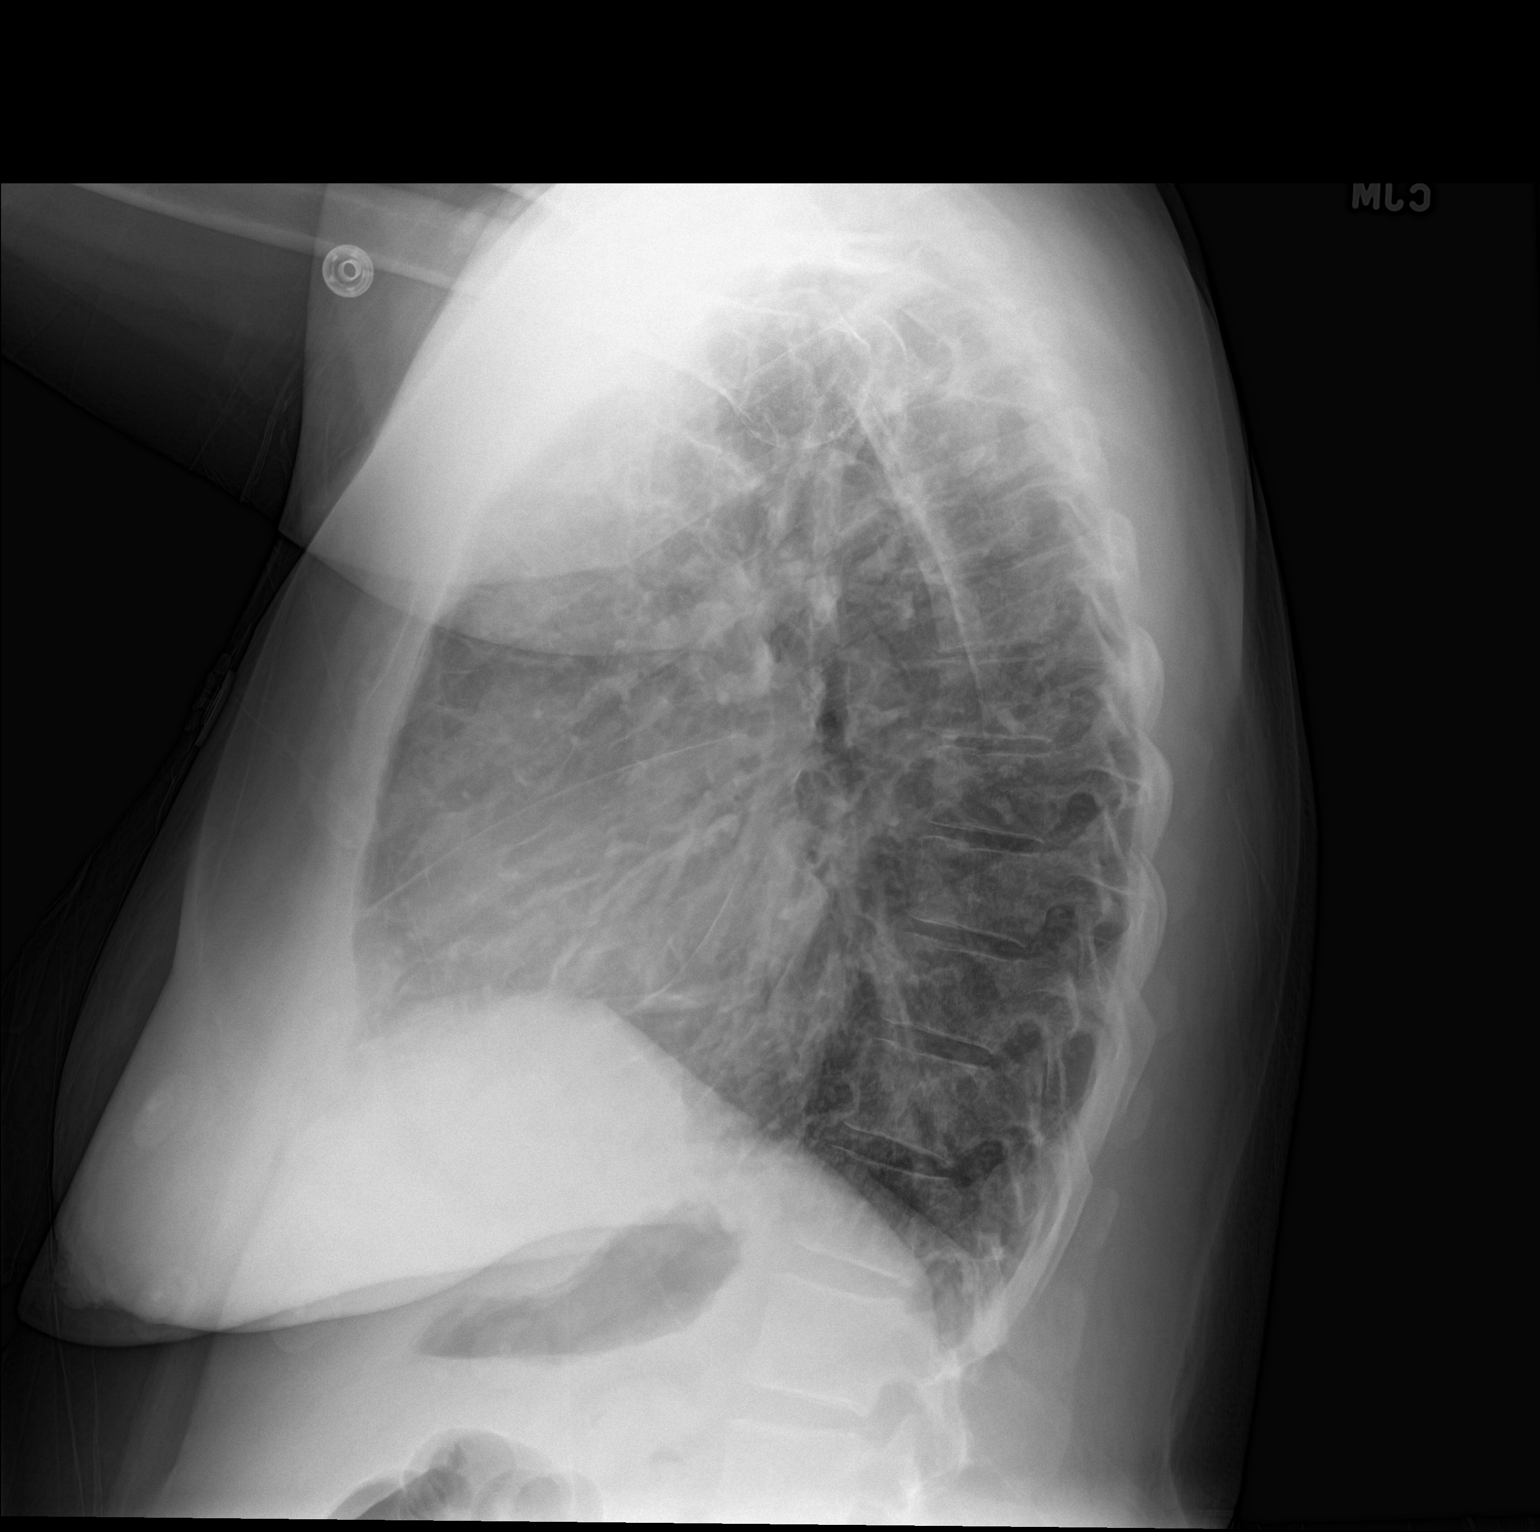

[2 of 2 positions shown; findings below may reference images not displayed]

FINDINGS: Two views of the chest demonstrate bibasilar interstitial
prominence, likely atelectatic changes. Atypical pneumonia is less
likely but not excluded. There is no focal consolidation, pleural
effusion, or pneumothorax. Stable cardiac silhouette. The osseous
structures appear unremarkable.
IMPRESSION: No focal consolidation.

## 2017-01-28 ENCOUNTER — Ambulatory Visit: Payer: Medicaid Other | Admitting: Family Medicine

## 2017-02-07 ENCOUNTER — Ambulatory Visit (INDEPENDENT_AMBULATORY_CARE_PROVIDER_SITE_OTHER): Payer: Medicaid Other | Admitting: Obstetrics & Gynecology

## 2017-02-07 ENCOUNTER — Other Ambulatory Visit (HOSPITAL_COMMUNITY)
Admission: RE | Admit: 2017-02-07 | Discharge: 2017-02-07 | Disposition: A | Discharge status: Home / Self Care | Payer: Medicaid Other | Source: Ambulatory Visit | Attending: Obstetrics & Gynecology | Admitting: Obstetrics & Gynecology

## 2017-02-07 VITALS — BP 113/72 | HR 73 | Wt 204.5 lb

## 2017-02-07 DIAGNOSIS — D259 Leiomyoma of uterus, unspecified: Secondary | ICD-10-CM | POA: Insufficient documentation

## 2017-02-07 DIAGNOSIS — Z23 Encounter for immunization: Secondary | ICD-10-CM | POA: Diagnosis not present

## 2017-02-07 LAB — POCT URINALYSIS DIP (DEVICE)
Bilirubin Urine: NEGATIVE
Glucose, UA: NEGATIVE mg/dL
Ketones, ur: NEGATIVE mg/dL
LEUKOCYTES UA: NEGATIVE
Nitrite: NEGATIVE
PH: 5.5 (ref 5.0–8.0)
Protein, ur: NEGATIVE mg/dL
SPECIFIC GRAVITY, URINE: 1.025 (ref 1.005–1.030)
Urobilinogen, UA: 0.2 mg/dL (ref 0.0–1.0)

## 2017-02-07 MED ORDER — IBUPROFEN 600 MG PO TABS: 600.0000 mg | ORAL_TABLET | Freq: Four times a day (QID) | ORAL | 1 refills | Status: DC | PRN

## 2017-02-07 NOTE — Progress Notes (Signed)
Subjective:     [name], female   DOB: July 06, 1965, 51 y.o.   MRN: [id number] vaginal spotting and low abdominal pain [id number] No LMP recorded. Patient is not currently having periods (Reason: IUD). Above sx noted a few days ago, some urinary frequency Past Medical History:  Diagnosis Date  . Abnormal uterine bleeding   . Alopecia   . Anemia   . Angina pectoris with normal coronary arteriogram ([place]) 2017   Had + Troponin c/w ? NSTEMI due to A on C CHF  . 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 Chronic combined systolic and diastolic CHF (congestive heart failure) ([place])   . DYSLIPIDEMIA   . Fibroids   . H/O noncompliance with medical treatment, presenting hazards to health   . Hypertension   . Menorrhagia   . Nonischemic cardiomyopathy ([place]) 1994; 2017   a. iniatially ?2/2 peripartum in 1994 - improved by 2008 then worsening EF in 2011 back down to EF 25-30%. b. echo 01/21/14 showed mod LVH, [name]; [name], [name], [name],   . NSTEMI (non-ST elevated myocardial infarction) ([place]) 05/2015   Normal Coronaries.  . Peripartum cardiomyopathy 1994  . Sleep apnea 2015   CPAP 12/2013  . Stroke [facility]) 2011  . Tobacco abuse    Past Surgical History:  Procedure Laterality Date  . CARDIAC CATHETERIZATION N/A 06/16/2015   Procedure: Left Heart Cath and Coronary Angiography;  Surgeon: Jettie Booze, MD;  Location: [place] [facility];  Service: Cardiovascular;  Laterality: N/A;  . [place]  . LOOP RECORDER IMPLANT  ~ 2000  . TIBIAL TUBERCLERPLASTY    . TUBAL LIGATION  1994   Allergies  Allergen Reactions  . Ace Inhibitors Other (See Comments)    REACTION: Cough   Current Outpatient Prescriptions on File Prior to Visit  Medication Sig Dispense Refill  . aspirin 81 MG EC tablet Take 1 tablet (81 mg total) by mouth daily. 30 tablet 0  . Biotin 10000 MCG TABS Take 1 tablet by mouth daily. 30 tablet 11  .  cyclobenzaprine (FLEXERIL) 10 MG tablet Take 1 tablet (10 mg total) by mouth 3 (three) times daily as needed for muscle spasms. 30 tablet 0  . furosemide (LASIX) 40 MG tablet Take 1.5 tablet (60 mg)by mouth in the AM and take an additional 1 tablet (40mg ) by mouth in the PM if necessary 75 tablet 11  . KLOR-CON M20 20 MEQ tablet TAKE 1 TABLET TWICE A DAY 60 tablet 8  . metoprolol succinate (TOPROL-XL) 50 MG 24 hr tablet Take 1 tablet (50 mg total) by mouth daily. 30 tablet 11  . sacubitril-valsartan (ENTRESTO) 97-103 MG Take 1 tablet by mouth 2 (two) times daily. 60 tablet 11  . simvastatin (ZOCOR) 10 MG tablet Take 1 tablet (10 mg total) by mouth every evening. 30 tablet 11  . spironolactone (ALDACTONE) 25 MG tablet Take 0.5 tablets (12.5 mg total) by mouth daily. 30 tablet 11   No current facility-administered medications on file prior to visit.       Review of Systems  Constitutional: Negative.   Gastrointestinal: Negative.   Genitourinary: Positive for frequency and pelvic pain. Negative for vaginal bleeding and vaginal discharge.       Objective:   Physical Exam  Constitutional: She is oriented to person, place, and time. She appears well-developed. No distress.  Genitourinary: Vagina normal and uterus normal. No vaginal discharge found.  Genitourinary  Comments: String short but palpable at the os  Neurological: She is alert and oriented to person, place, and time.  Psychiatric: She has a normal mood and affect. Her behavior is normal.       Assessment:     Doubt UTI Fibroids STD screen requested and ordered    Plan:     Check result of STD screen Report if sx worsen  Woodroe Mode, MD 02/07/2017

## 2017-02-08 ENCOUNTER — Encounter: Payer: Self-pay | Admitting: Obstetrics & Gynecology

## 2017-02-08 LAB — CERVICOVAGINAL ANCILLARY ONLY
Bacterial vaginitis: POSITIVE — AB
CANDIDA VAGINITIS: POSITIVE — AB
CHLAMYDIA, DNA PROBE: NEGATIVE
Neisseria Gonorrhea: NEGATIVE
Trichomonas: NEGATIVE

## 2017-02-09 ENCOUNTER — Telehealth: Payer: Self-pay | Admitting: General Practice

## 2017-02-09 ENCOUNTER — Telehealth: Payer: Self-pay

## 2017-02-09 DIAGNOSIS — B373 Candidiasis of vulva and vagina: Secondary | ICD-10-CM

## 2017-02-09 DIAGNOSIS — N760 Acute vaginitis: Secondary | ICD-10-CM

## 2017-02-09 DIAGNOSIS — B3731 Acute candidiasis of vulva and vagina: Secondary | ICD-10-CM

## 2017-02-09 DIAGNOSIS — B9689 Other specified bacterial agents as the cause of diseases classified elsewhere: Secondary | ICD-10-CM

## 2017-02-09 MED ORDER — METRONIDAZOLE 500 MG PO TABS: 500.0000 mg | ORAL_TABLET | Freq: Two times a day (BID) | ORAL | 0 refills | Status: DC

## 2017-02-09 MED ORDER — FLUCONAZOLE 150 MG PO TABS: 150.0000 mg | ORAL_TABLET | Freq: Once | ORAL | 0 refills | Status: DC

## 2017-02-09 NOTE — Telephone Encounter (Signed)
Called patient, no answer- left message stating we are trying to reach you with results please call us back. Meds ordered. Will send letter.

## 2017-02-09 NOTE — Telephone Encounter (Signed)
Pt called and requested to have her Rx's for BV and yeast to be called to her [facility] on [address].  I also advised pt to complete the Flagyl course before she starts to take the Diflucan because the antibiotic can also cause a yeast infection.  Pt stated understanding with no further questions.

## 2017-02-09 NOTE — Telephone Encounter (Signed)
-----   Message from Woodroe Mode, MD sent at 02/09/2017  9:11 AM EDT ----- BV and candida, Rx Diflucan 150 mg and Flagyl 500 gm BID 7 days

## 2017-02-10 ENCOUNTER — Telehealth: Payer: Self-pay | Admitting: Obstetrics & Gynecology

## 2017-02-10 NOTE — Telephone Encounter (Signed)
Patient stated she accidentally threw away her medication.

## 2017-02-11 ENCOUNTER — Other Ambulatory Visit: Payer: Self-pay

## 2017-02-11 DIAGNOSIS — N760 Acute vaginitis: Principal | ICD-10-CM

## 2017-02-11 DIAGNOSIS — B9689 Other specified bacterial agents as the cause of diseases classified elsewhere: Secondary | ICD-10-CM

## 2017-02-11 MED ORDER — METRONIDAZOLE 500 MG PO TABS: 500.0000 mg | ORAL_TABLET | Freq: Two times a day (BID) | ORAL | 0 refills | Status: DC

## 2017-02-11 NOTE — Progress Notes (Signed)
Patient called the office stating that she accidentally threw away her Flagyl that was prescribed to her on 9/19. Medication was sent to pharmacy.

## 2017-03-10 ENCOUNTER — Other Ambulatory Visit: Payer: Self-pay | Admitting: Obstetrics & Gynecology

## 2017-03-10 DIAGNOSIS — B3731 Acute candidiasis of vulva and vagina: Secondary | ICD-10-CM

## 2017-03-10 DIAGNOSIS — B373 Candidiasis of vulva and vagina: Secondary | ICD-10-CM

## 2017-03-15 ENCOUNTER — Ambulatory Visit: Payer: Medicaid Other | Attending: Family Medicine | Admitting: Family Medicine

## 2017-03-15 ENCOUNTER — Encounter: Payer: Self-pay | Admitting: Family Medicine

## 2017-03-15 VITALS — BP 106/70 | HR 78 | Temp 98.0°F | Wt 204.8 lb

## 2017-03-15 DIAGNOSIS — I110 Hypertensive heart disease with heart failure: Secondary | ICD-10-CM | POA: Diagnosis not present

## 2017-03-15 DIAGNOSIS — I5042 Chronic combined systolic (congestive) and diastolic (congestive) heart failure: Secondary | ICD-10-CM | POA: Diagnosis not present

## 2017-03-15 DIAGNOSIS — Z791 Long term (current) use of non-steroidal anti-inflammatories (NSAID): Secondary | ICD-10-CM | POA: Diagnosis not present

## 2017-03-15 DIAGNOSIS — Z9889 Other specified postprocedural states: Secondary | ICD-10-CM | POA: Diagnosis not present

## 2017-03-15 DIAGNOSIS — R946 Abnormal results of thyroid function studies: Secondary | ICD-10-CM | POA: Insufficient documentation

## 2017-03-15 DIAGNOSIS — G8929 Other chronic pain: Secondary | ICD-10-CM

## 2017-03-15 DIAGNOSIS — E785 Hyperlipidemia, unspecified: Secondary | ICD-10-CM | POA: Insufficient documentation

## 2017-03-15 DIAGNOSIS — Z79899 Other long term (current) drug therapy: Secondary | ICD-10-CM | POA: Diagnosis not present

## 2017-03-15 DIAGNOSIS — I429 Cardiomyopathy, unspecified: Secondary | ICD-10-CM | POA: Diagnosis not present

## 2017-03-15 DIAGNOSIS — Z8673 Personal history of transient ischemic attack (TIA), and cerebral infarction without residual deficits: Secondary | ICD-10-CM | POA: Insufficient documentation

## 2017-03-15 DIAGNOSIS — R7989 Other specified abnormal findings of blood chemistry: Secondary | ICD-10-CM | POA: Diagnosis not present

## 2017-03-15 DIAGNOSIS — Z7982 Long term (current) use of aspirin: Secondary | ICD-10-CM | POA: Diagnosis not present

## 2017-03-15 DIAGNOSIS — I252 Old myocardial infarction: Secondary | ICD-10-CM | POA: Insufficient documentation

## 2017-03-15 DIAGNOSIS — I10 Essential (primary) hypertension: Secondary | ICD-10-CM

## 2017-03-15 DIAGNOSIS — M545 Low back pain, unspecified: Secondary | ICD-10-CM

## 2017-03-15 DIAGNOSIS — G4730 Sleep apnea, unspecified: Secondary | ICD-10-CM | POA: Insufficient documentation

## 2017-03-15 DIAGNOSIS — Z888 Allergy status to other drugs, medicaments and biological substances status: Secondary | ICD-10-CM | POA: Insufficient documentation

## 2017-03-15 DIAGNOSIS — L659 Nonscarring hair loss, unspecified: Secondary | ICD-10-CM | POA: Diagnosis not present

## 2017-03-15 HISTORY — DX: Other specified abnormal findings of blood chemistry: R79.89

## 2017-03-15 MED ORDER — KETOCONAZOLE 2 % EX SHAM: 1.0000 "application " | MEDICATED_SHAMPOO | CUTANEOUS | 0 refills | Status: DC

## 2017-03-15 MED ORDER — CYCLOBENZAPRINE HCL 10 MG PO TABS: 10.0000 mg | ORAL_TABLET | Freq: Two times a day (BID) | ORAL | 1 refills | Status: DC | PRN

## 2017-03-15 NOTE — Patient Instructions (Signed)

## 2017-03-15 NOTE — Progress Notes (Signed)
Subjective:  [name], female    DOB: 12/26/1965  Age: 51 y.o. MRN: [id number]  CC: Back Pain and Alopecia   HPI Chelsea Branch is a 51 year old female with a history of CHF (EF 30-35% from 2-D echo of 05/2016), hypertension who presents today with complaints of low back pain and alopecia.  She has had low back pain for over one year which was exacerbated after she fell down the stairs one year ago. Pain radiates to both lower extremities but she denies numbness. She denies subsequent falls and does not need an assistive ambulatory device. Denies loss of sphincteric functions.  She does have alopecia which has been present for the last 1 year and she does have associated itching of her scalp. Reviewed last TSH reports which were suppressed at 0.24 and 0.25 respectively in 03/2016 and 04/2016.  With regards to her congestive heart failure she denies shortness of breath, pedal edema and was last seen by the heart failure clinic pharmacist in 08/2016. She endorses compliance with medications.  Past Medical History:  Diagnosis Date  . Abnormal uterine bleeding   . Alopecia   . Anemia   . Angina pectoris with normal coronary arteriogram ([place]) 2017   Had + Troponin c/w ? NSTEMI due to A on C CHF  . 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 Chronic combined systolic and diastolic CHF (congestive heart failure) ([place])   . DYSLIPIDEMIA   . Fibroids   . H/O noncompliance with medical treatment, presenting hazards to health   . Hypertension   . Menorrhagia   . Nonischemic cardiomyopathy ([place]) 1994; 2017   a. iniatially ?2/2 peripartum in 1994 - improved by 2008 then worsening EF in 2011 back down to EF 25-30%. b. echo 01/21/14 showed mod LVH, [name]; [name], [name], [name],   . NSTEMI (non-ST elevated myocardial infarction) ([place]) 05/2015   Normal Coronaries.  . Peripartum cardiomyopathy 1994  . Sleep apnea 2015   CPAP 12/2013  . Stroke [facility]) 2011  .  Tobacco abuse     Past Surgical History:  Procedure Laterality Date  . CARDIAC CATHETERIZATION N/A 06/16/2015   Procedure: Left Heart Cath and Coronary Angiography;  Surgeon: Jettie Booze, MD;  Location: [place] [facility];  Service: Cardiovascular;  Laterality: N/A;  . [place]  . LOOP RECORDER IMPLANT  ~ 2000  . TIBIAL TUBERCLERPLASTY    . TUBAL LIGATION  1994    Allergies  Allergen Reactions  . Ace Inhibitors Other (See Comments)    REACTION: Cough      Outpatient Medications Prior to Visit  Medication Sig Dispense Refill  . aspirin 81 MG EC tablet Take 1 tablet (81 mg total) by mouth daily. 30 tablet 0  . furosemide (LASIX) 40 MG tablet Take 1.5 tablet (60 mg)by mouth in the AM and take an additional 1 tablet (40mg ) by mouth in the PM if necessary 75 tablet 11  . ibuprofen (ADVIL,MOTRIN) 600 MG tablet Take 1 tablet (600 mg total) by mouth every 6 (six) hours as needed. 30 tablet 1  . KLOR-CON M20 20 MEQ tablet TAKE 1 TABLET TWICE A DAY 60 tablet 8  . metoprolol succinate (TOPROL-XL) 50 MG 24 hr tablet Take 1 tablet (50 mg total) by mouth daily. 30 tablet 11  . sacubitril-valsartan (ENTRESTO) 97-103 MG Take 1 tablet by mouth 2 (two) times daily. 60 tablet 11  . simvastatin (ZOCOR) 10 MG  tablet Take 1 tablet (10 mg total) by mouth every evening. 30 tablet 11  . spironolactone (ALDACTONE) 25 MG tablet Take 0.5 tablets (12.5 mg total) by mouth daily. 30 tablet 11  . cyclobenzaprine (FLEXERIL) 10 MG tablet Take 1 tablet (10 mg total) by mouth 3 (three) times daily as needed for muscle spasms. 30 tablet 0  . Biotin 10000 MCG TABS Take 1 tablet by mouth daily. (Patient not taking: Reported on 03/15/2017) 30 tablet 11  . metroNIDAZOLE (FLAGYL) 500 MG tablet Take 1 tablet (500 mg total) by mouth 2 (two) times daily. (Patient not taking: Reported on 03/15/2017) 14 tablet 0   No facility-administered medications prior to visit.     ROS Review of Systems    Constitutional: Negative for activity change, appetite change and fatigue.  HENT: Negative for congestion, sinus pressure and sore throat.   Eyes: Negative for visual disturbance.  Respiratory: Negative for cough, chest tightness, shortness of breath and wheezing.   Cardiovascular: Negative for chest pain and palpitations.  Gastrointestinal: Negative for abdominal distention, abdominal pain and constipation.  Endocrine: Negative for polydipsia.  Genitourinary: Negative for dysuria and frequency.  Musculoskeletal: Positive for back pain. Negative for arthralgias.  Skin: Negative for rash.  Neurological: Negative for tremors, light-headedness and numbness.  Hematological: Does not bruise/bleed easily.  Psychiatric/Behavioral: Negative for agitation and behavioral problems.    Objective:  BP 106/70   Pulse 78   Temp 98 F (36.7 C) (Oral)   Wt 204 lb 12.8 oz (92.9 kg)   SpO2 98%   BMI 31.14 kg/m   BP/Weight 03/15/2017 02/07/2017 11/24/5007  Systolic BP 381 829 937  Diastolic BP 70 72 78  Wt. (Lbs) 204.8 204.5 -  BMI 31.14 31.09 -      Physical Exam  Constitutional: She is oriented to person, place, and time. She appears well-developed and well-nourished.  Cardiovascular: Normal rate, normal heart sounds and intact distal pulses.   No murmur heard. Pulmonary/Chest: Effort normal and breath sounds normal. She has no wheezes. She has no rales. She exhibits no tenderness.  Abdominal: Soft. Bowel sounds are normal. She exhibits no distension and no mass. There is no tenderness.  Musculoskeletal: Normal range of motion. She exhibits no tenderness.  Negative straight leg raise bilaterally  Neurological: She is alert and oriented to person, place, and time. She has normal reflexes.  Skin:  Patchy alopecia of the scalp No scaly lesions seen     Assessment & Plan:   1. Chronic bilateral low back pain without sciatica Advised to apply heat We'll try muscle relaxants Lumbar  spine x-ray if symptoms persist at next visit - cyclobenzaprine (FLEXERIL) 10 MG tablet; Take 1 tablet (10 mg total) by mouth 2 (two) times daily as needed for muscle spasms.  Dispense: 60 tablet; Refill: 1  2. Alopecia With possibly superimposed seborrheic dermatitis Placed on Nizoral - ketoconazole (NIZORAL) 2 % shampoo; Apply 1 application topically 2 (two) times a week.  Dispense: 120 mL; Refill: 0 - TSH  3. Abnormal thyroid blood test Last set of TSH was suppressed in 03/2016 and 04/2016 We'll check thyroid function again - TSH  4. Essential hypertension Controlled - Basic Metabolic Panel  5. Chronic combined systolic and diastolic CHF (congestive heart failure) (HCC) EF of 30-35% from 2-D echo 05/2016 Continue Lasix,Entresto, beta blocker Keep appointment with cardiology   Meds ordered this encounter  Medications  . cyclobenzaprine (FLEXERIL) 10 MG tablet    Sig: Take 1 tablet (10 mg total)  by mouth 2 (two) times daily as needed for muscle spasms.    Dispense:  60 tablet    Refill:  1  . ketoconazole (NIZORAL) 2 % shampoo    Sig: Apply 1 application topically 2 (two) times a week.    Dispense:  120 mL    Refill:  0    Follow-up: Return in about 6 weeks (around 04/26/2017) for Follow-up of alopecia back pain.   Arnoldo Morale MD

## 2017-03-16 LAB — BASIC METABOLIC PANEL
BUN / CREAT RATIO: 11 (ref 9–23)
BUN: 9 mg/dL (ref 6–24)
CHLORIDE: 100 mmol/L (ref 96–106)
CO2: 27 mmol/L (ref 20–29)
Calcium: 9.6 mg/dL (ref 8.7–10.2)
Creatinine, Ser: 0.83 mg/dL (ref 0.57–1.00)
GFR calc non Af Amer: 81 mL/min/{1.73_m2} (ref >59)
GFR, EST AFRICAN AMERICAN: 94 mL/min/{1.73_m2} (ref >59)
GLUCOSE: 82 mg/dL (ref 65–99)
Potassium: 3.5 mmol/L (ref 3.5–5.2)
Sodium: 144 mmol/L (ref 134–144)

## 2017-03-16 LAB — TSH: TSH: 0.453 u[IU]/mL (ref 0.450–4.500)

## 2017-03-17 ENCOUNTER — Telehealth: Payer: Self-pay | Admitting: General Practice

## 2017-03-17 DIAGNOSIS — B379 Candidiasis, unspecified: Secondary | ICD-10-CM

## 2017-03-17 DIAGNOSIS — B3741 Candidal cystitis and urethritis: Secondary | ICD-10-CM

## 2017-03-17 MED ORDER — FLUCONAZOLE 150 MG PO TABS
150.0000 mg | ORAL_TABLET | Freq: Once | ORAL | 0 refills | Status: AC
Start: 2017-03-17 — End: 2017-03-17

## 2017-03-17 NOTE — Telephone Encounter (Signed)
Patient's pharmacy called and left message stating the patient is requesting a refill on diflucan. Med ordered per protocol. Called patient, no answer- left message stating we are calling to inform you that the refill you have requested has been sent to your pharmacy for you to pick up. You may call back if you have questions

## 2017-03-21 ENCOUNTER — Encounter (INDEPENDENT_AMBULATORY_CARE_PROVIDER_SITE_OTHER): Payer: Self-pay

## 2017-03-21 ENCOUNTER — Encounter: Payer: Self-pay | Admitting: Internal Medicine

## 2017-03-21 ENCOUNTER — Ambulatory Visit (INDEPENDENT_AMBULATORY_CARE_PROVIDER_SITE_OTHER): Payer: Medicaid Other | Admitting: Internal Medicine

## 2017-03-21 VITALS — BP 140/96 | HR 90 | Ht 68.0 in | Wt 207.0 lb

## 2017-03-21 DIAGNOSIS — I5042 Chronic combined systolic (congestive) and diastolic (congestive) heart failure: Secondary | ICD-10-CM

## 2017-03-21 DIAGNOSIS — I428 Other cardiomyopathies: Secondary | ICD-10-CM

## 2017-03-21 DIAGNOSIS — I10 Essential (primary) hypertension: Secondary | ICD-10-CM

## 2017-03-21 NOTE — Patient Instructions (Signed)
Medication Instructions: Your physician has recommended you make the following change in your medication:  -1) INCREASE LASIX (furosemide) - Take 2 tablets (80 mg) by mouth twice daily for the next 7 days - then resume 1.5  tablets (60 mg) by mouth in the AM and and extra tablet in the PM If necessary -2) INCREASE POTASSIUM - Take 2 tablets (40 meq) by mouth twice daily for the next 7 days - then resume 1 tablet (20 meq) by mouth twice daily  Labwork: None Ordered  Procedures/Testing: Your physician has requested that you have an echocardiogram. Echocardiography is a painless test that uses sound waves to create images of your heart. It provides your doctor with information about the size and shape of your heart and how well your heart's chambers and valves are working. This procedure takes approximately one hour. There are no restrictions for this procedure.  Follow-Up: Your physician recommends that you schedule a follow-up appointment in: 3 MONTHS with Tommye Standard, PA   If you need a refill on your cardiac medications before your next appointment, please call your pharmacy.

## 2017-03-21 NOTE — Progress Notes (Signed)
Patient Care Team: Arnoldo Morale, MD as PCP - General (Family Medicine) Deboraha Sprang, MD as PCP - Cardiology (Cardiology)   HPI  Chelsea Branch is a 51 y.o. 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 She had recovery of LV function most recently in 2008. 2011 left ventricular function had decreased to 25%.  She has significant hypertension.   She was rehospitalized 3/15 for acute on chronic congestive heart failure. She was treated with IV diuresis. Echocardiogram demonstrated EF 25-30%.   . Echocardiogram 8/15 demonstrated normalization of LV systolic function  DATE TEST    1/17 Cath Normal CA   1/17    Echo   EF 25-30 %   1/18    Echo EF 30-35%    Date Cr K  10/18 0.83 3.5         She has noted significant increase in abdominal girth.  Reviewing her diet her home salt intake is relatively low.  Her stop off at [facility] for breakfast is a challenge   She has not noted edema.  She is not having chest pain.         Past Medical History:  Diagnosis Date  . Abnormal uterine bleeding   . Alopecia   . Anemia   . Angina pectoris with normal coronary arteriogram ([place]) 2017   Had + Troponin c/w ? NSTEMI due to A on C CHF  . 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 Chronic combined systolic and diastolic CHF (congestive heart failure) ([place])   . DYSLIPIDEMIA   . Fibroids   . H/O noncompliance with medical treatment, presenting hazards to health   . Hypertension   . Menorrhagia   . Nonischemic cardiomyopathy ([place]) 1994; 2017   a. iniatially ?2/2 peripartum in 1994 - improved by 2008 then worsening EF in 2011 back down to EF 25-30%. b. echo 01/21/14 showed mod LVH, [name]; [name], [name], [name],   . NSTEMI (non-ST elevated myocardial infarction) ([place]) 05/2015   Normal Coronaries.  . Peripartum cardiomyopathy 1994  . Sleep apnea 2015   CPAP 12/2013  . Stroke [facility]) 2011  . Tobacco abuse     Past  Surgical History:  Procedure Laterality Date  . CARDIAC CATHETERIZATION N/A 06/16/2015   Procedure: Left Heart Cath and Coronary Angiography;  Surgeon: Jettie Booze, MD;  Location: [place] [facility];  Service: Cardiovascular;  Laterality: N/A;  . [place]  . LOOP RECORDER IMPLANT  ~ 2000  . TIBIAL TUBERCLERPLASTY    . TUBAL LIGATION  1994    Current Outpatient Prescriptions  Medication Sig Dispense Refill  . aspirin 81 MG EC tablet Take 1 tablet (81 mg total) by mouth daily. 30 tablet 0  . Biotin 10000 MCG TABS Take 1 tablet by mouth daily. 30 tablet 11  . cyclobenzaprine (FLEXERIL) 10 MG tablet Take 1 tablet (10 mg total) by mouth 2 (two) times daily as needed for muscle spasms. 60 tablet 1  . furosemide (LASIX) 40 MG tablet Take 1.5 tablet (60 mg)by mouth in the AM and take an additional 1 tablet (40mg ) by mouth in the PM if necessary 75 tablet 11  . ibuprofen (ADVIL,MOTRIN) 600 MG tablet Take 1 tablet (600 mg total) by mouth every 6 (six) hours as needed. 30 tablet 1  . ketoconazole (NIZORAL) 2 % shampoo Apply 1 application topically 2 (two) times a week. 120 mL 0  .  KLOR-CON M20 20 MEQ tablet TAKE 1 TABLET TWICE A DAY 60 tablet 8  . metoprolol succinate (TOPROL-XL) 50 MG 24 hr tablet Take 1 tablet (50 mg total) by mouth daily. 30 tablet 11  . sacubitril-valsartan (ENTRESTO) 97-103 MG Take 1 tablet by mouth 2 (two) times daily. 60 tablet 11  . simvastatin (ZOCOR) 10 MG tablet Take 1 tablet (10 mg total) by mouth every evening. 30 tablet 11  . spironolactone (ALDACTONE) 25 MG tablet Take 0.5 tablets (12.5 mg total) by mouth daily. 30 tablet 11   No current facility-administered medications for this visit.     Allergies  Allergen Reactions  . Ace Inhibitors Other (See Comments)    REACTION: Cough    Review of Systems negative except from HPI and PMH  Physical Exam BP (!) 140/96   Pulse 90   Ht 5\' 8"  (1.727 m)   Wt 207 lb (93.9 kg)   BMI 31.47 kg/m   Well developed and nourished in no acute distress HENT normal Neck supple with [id number] Clear Regular rate and rhythm, no murmurs or gallops Abd-soft with active BS No Clubbing cyanosis edema Skin-warm and dry A & Oriented  Grossly normal sensory and motor function   ECG sinus @ 90 16/09/39  Assessment and  Plan  Nonischemic cardiomyopathy  Congestive heart failure acute chronic   Hypertension-resolved  Obstructive sleep apnea AHI 18   Volume overloaded.  We will increase her diuretic from 80 daily to 80 twice daily for a week.  We have reviewed the importance of salt restriction with her diet.  I looked up Dr. Malachi Bonds.  It is not too bad.  We will plan also to look at her LV function to assess candidacy for ICD implantation for primary prevention  She is tolerating her Entresto well  More than 50% of 40 min was spent in counseling related to the above

## 2017-03-22 ENCOUNTER — Other Ambulatory Visit: Payer: Self-pay

## 2017-03-22 DIAGNOSIS — I5042 Chronic combined systolic (congestive) and diastolic (congestive) heart failure: Secondary | ICD-10-CM

## 2017-03-22 DIAGNOSIS — Z79899 Other long term (current) drug therapy: Secondary | ICD-10-CM

## 2017-03-23 ENCOUNTER — Telehealth: Payer: Self-pay

## 2017-03-23 NOTE — Telephone Encounter (Signed)
Pt was called and informed of lab results. 

## 2017-03-29 ENCOUNTER — Telehealth: Payer: Self-pay | Admitting: *Deleted

## 2017-03-29 ENCOUNTER — Other Ambulatory Visit: Payer: Medicaid Other | Admitting: *Deleted

## 2017-03-29 ENCOUNTER — Other Ambulatory Visit: Payer: Self-pay

## 2017-03-29 ENCOUNTER — Ambulatory Visit (HOSPITAL_COMMUNITY): Payer: Medicaid Other | Attending: Cardiovascular Disease

## 2017-03-29 DIAGNOSIS — E876 Hypokalemia: Secondary | ICD-10-CM

## 2017-03-29 DIAGNOSIS — I5042 Chronic combined systolic (congestive) and diastolic (congestive) heart failure: Secondary | ICD-10-CM

## 2017-03-29 DIAGNOSIS — Z79899 Other long term (current) drug therapy: Secondary | ICD-10-CM

## 2017-03-29 LAB — BASIC METABOLIC PANEL
BUN / CREAT RATIO: 14 (ref 9–23)
BUN: 10 mg/dL (ref 6–24)
CO2: 24 mmol/L (ref 20–29)
CREATININE: 0.74 mg/dL (ref 0.57–1.00)
Calcium: 9.2 mg/dL (ref 8.7–10.2)
Chloride: 98 mmol/L (ref 96–106)
GFR calc Af Amer: 108 mL/min/{1.73_m2} (ref >59)
GFR calc non Af Amer: 93 mL/min/{1.73_m2} (ref >59)
Glucose: 91 mg/dL (ref 65–99)
Potassium: 3.1 mmol/L — ABNORMAL LOW (ref 3.5–5.2)
Sodium: 141 mmol/L (ref 134–144)

## 2017-03-29 NOTE — Telephone Encounter (Signed)
03/29/17 K=3.1  Per standing orders --K 3.0-3.4 --and already on supplemental potassium, take extra dose of KCL for 2 days then arrange f/u labs within a week. Route note to primary MD.  I attempted to contact pt at [phone] [facility]) and [phone], unable to leave message at either number.   I will forward to Dr Caryl Comes and Triage for follow-up.

## 2017-03-30 NOTE — Telephone Encounter (Signed)
Attempted to contact pt. Unable to leave message.

## 2017-03-31 NOTE — Telephone Encounter (Signed)
She works Museum/gallery conservator at a [employer]  I didn't see the tel number on snapshot-but if we have it elsewhere please call otther wise send certified letter

## 2017-03-31 NOTE — Telephone Encounter (Signed)
Called mother Estill Bamberg at [phone] to reach Mrs. Ashley Akin.Per mother, patient has a new number. Called new number at [phone]. Left voicemail to call back.

## 2017-04-01 NOTE — Telephone Encounter (Signed)
Instructed patient to take extra doses of KCL for 2 days due to potassium level of 3.1. Patient has follow up lab appt on 11/16 for BMET. Patient in agreement with plan and thanked me for he call.

## 2017-04-04 ENCOUNTER — Telehealth: Payer: Self-pay

## 2017-04-04 NOTE — Telephone Encounter (Signed)
lmtcb for echo results.

## 2017-04-08 ENCOUNTER — Other Ambulatory Visit: Payer: Medicaid Other

## 2017-04-09 ENCOUNTER — Other Ambulatory Visit: Payer: Self-pay

## 2017-04-09 ENCOUNTER — Encounter (HOSPITAL_COMMUNITY): Payer: Self-pay | Admitting: Emergency Medicine

## 2017-04-09 ENCOUNTER — Emergency Department (HOSPITAL_COMMUNITY)
Admission: EM | Admit: 2017-04-09 | Discharge: 2017-04-10 | Disposition: A | Discharge status: Home / Self Care | Payer: Medicaid Other | Attending: Physician Assistant | Admitting: Physician Assistant

## 2017-04-09 DIAGNOSIS — Z87891 Personal history of nicotine dependence: Secondary | ICD-10-CM | POA: Insufficient documentation

## 2017-04-09 DIAGNOSIS — Z8673 Personal history of transient ischemic attack (TIA), and cerebral infarction without residual deficits: Secondary | ICD-10-CM | POA: Insufficient documentation

## 2017-04-09 DIAGNOSIS — I5043 Acute on chronic combined systolic (congestive) and diastolic (congestive) heart failure: Secondary | ICD-10-CM | POA: Insufficient documentation

## 2017-04-09 DIAGNOSIS — I110 Hypertensive heart disease with heart failure: Secondary | ICD-10-CM | POA: Diagnosis not present

## 2017-04-09 DIAGNOSIS — Z79899 Other long term (current) drug therapy: Secondary | ICD-10-CM | POA: Diagnosis not present

## 2017-04-09 DIAGNOSIS — Z7982 Long term (current) use of aspirin: Secondary | ICD-10-CM | POA: Diagnosis not present

## 2017-04-09 DIAGNOSIS — L509 Urticaria, unspecified: Secondary | ICD-10-CM | POA: Insufficient documentation

## 2017-04-09 DIAGNOSIS — I252 Old myocardial infarction: Secondary | ICD-10-CM | POA: Diagnosis not present

## 2017-04-09 LAB — CBG MONITORING, ED: GLUCOSE-CAPILLARY: 121 mg/dL — AB (ref 65–99)

## 2017-04-09 MED ORDER — FAMOTIDINE 20 MG PO TABS
20.0000 mg | ORAL_TABLET | Freq: Once | ORAL | Status: AC
  Administered 2017-04-09: 20 mg via ORAL
  Filled 2017-04-09: qty 1

## 2017-04-09 MED ORDER — DIPHENHYDRAMINE HCL 25 MG PO CAPS
25.0000 mg | ORAL_CAPSULE | Freq: Once | ORAL | Status: AC
  Administered 2017-04-09: 25 mg via ORAL
  Filled 2017-04-09: qty 1

## 2017-04-09 MED ORDER — PREDNISONE 20 MG PO TABS
60.0000 mg | ORAL_TABLET | Freq: Once | ORAL | Status: AC
  Administered 2017-04-09: 60 mg via ORAL
  Filled 2017-04-09: qty 3

## 2017-04-09 NOTE — ED Triage Notes (Signed)
Pt reports she woke up w/ hives on her face and back.  She is not experiencing any SOB and is able to speak in complete sentences.  She ate at the church 4 hours ago and ate fish among other things.

## 2017-04-10 MED ORDER — FAMOTIDINE 20 MG PO TABS: 20.0000 mg | ORAL_TABLET | Freq: Two times a day (BID) | ORAL | 0 refills | Status: DC

## 2017-04-10 MED ORDER — PREDNISONE 10 MG PO TABS: 20.0000 mg | ORAL_TABLET | Freq: Every day | ORAL | 0 refills | Status: DC

## 2017-04-10 MED ORDER — DIPHENHYDRAMINE HCL 25 MG PO CAPS: 25.0000 mg | ORAL_CAPSULE | Freq: Four times a day (QID) | ORAL | 0 refills | Status: DC | PRN

## 2017-04-10 NOTE — ED Provider Notes (Signed)
[facility] EMERGENCY DEPARTMENT Provider Note   CSN: [id number] Arrival date & time: 04/09/17  2104     History   Chief Complaint Chief Complaint  Patient presents with  . Allergic Reaction    HPI Darren Nodal is a 51 y.o. female.  HPI   Patient is a 51 year old  female with a history of nonischemic cardiomyopathy, hypertension, anemia presenting for hives.  Patient reports that they woke her up from sleep around 8:30 PM.  Patient reports that she went to a cookout at her church and ate fish around 430 to 5 PM.  She is not sure what type of fish this was.  Patient reports that the hives are affecting her face, chest, back, and feels that her lips feel blister, but not swollen.  Patient had a brief episode of nausea on presentation to the emergency department.  Patient denies any posterior pharynx swelling, wheezing, shortness of breath, palpitations, chest pain, abdominal cramping, diarrhea.  Patient has not tried any new detergents, lotions, oils, or soaps.  Patient denies any new medications.  No remedies tried prior to presentation to the emergency department.  Past Medical History:  Diagnosis Date  . Abnormal uterine bleeding   . Alopecia   . Anemia   . Angina pectoris with normal coronary arteriogram ([place]) 2017   Had + Troponin c/w ? NSTEMI due to A on C CHF  . 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 Chronic combined systolic and diastolic CHF (congestive heart failure) ([place])   . DYSLIPIDEMIA   . Fibroids   . H/O noncompliance with medical treatment, presenting hazards to health   . Hypertension   . Menorrhagia   . Nonischemic cardiomyopathy ([place]) 1994; 2017   a. iniatially ?2/2 peripartum in 1994 - improved by 2008 then worsening EF in 2011 back down to EF 25-30%. b. echo 01/21/14 showed mod LVH, [name]; [name], [name], [name],   . NSTEMI (non-ST elevated myocardial infarction) ([place]) 05/2015   Normal Coronaries.  .  Peripartum cardiomyopathy 1994  . Sleep apnea 2015   CPAP 12/2013  . Stroke [facility]) 2011  . Tobacco abuse     Patient Active Problem List   Diagnosis Date Noted  . Abnormal thyroid blood test 03/15/2017  . Hypokalemia 07/23/2016  . Low TSH level 04/26/2016  . Abnormal uterine bleeding 04/06/2016  . Nonspecific chest pain   . 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 NSTEMI (non-ST elevated myocardial infarction) ([place]) - with normal cornaries on Cath 06/14/2015  . Systolic and diastolic CHF, acute on chronic ([place]) 06/14/2015  . H/O noncompliance with medical treatment, presenting hazards to health   . Low back pain 09/16/2014  . Homelessness 09/16/2014  . Vitamin D deficiency 01/29/2014  . OSA (obstructive sleep apnea) 01/08/2014  . Chronic combined systolic and diastolic CHF (congestive heart failure) ([place]) 08/14/2013  . Essential hypertension   . Fibroids 08/09/2012  . Menorrhagia 05/08/2012  . Alopecia   . HLD (hyperlipidemia) 06/08/2010  . CEREBRAL ANEURYSM 06/08/2010  . 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 CEREBROVASCULAR ACCIDENT, HX OF 06/08/2010    Past Surgical History:  Procedure Laterality Date  . [place]  . Left Heart Cath and Coronary Angiography N/A 06/16/2015   Performed by Jettie Booze, MD at [place] [facility]  . LOOP RECORDER IMPLANT  ~ 2000  . TIBIAL TUBERCLERPLASTY    . [place]  History    Gravida Para Term Preterm AB Living   2 2 2     3    SAB TAB Ectopic Multiple Live Births         1 3       Home Medications    Prior to Admission medications   Medication Sig Start Date End Date Taking? Authorizing Provider  aspirin 81 MG EC tablet Take 1 tablet (81 mg total) by mouth daily. 03/31/16  Yes Milagros Loll, MD  Biotin 10000 MCG TABS Take 1 tablet by mouth daily. 04/26/16  Yes Funches, Josalyn, MD  furosemide (LASIX) 40 MG tablet Take 1.5 tablet (60 mg)by mouth in the AM and take an  additional 1 tablet (40mg ) by mouth in the PM if necessary Patient taking differently: Take 40-60 mg See admin instructions by mouth. Take 1.5 tablet (60 mg)by mouth in the AM and take an additional 1 tablet (40mg ) by mouth in the PM if necessary 07/01/16  Yes Deboraha Sprang, MD  ketoconazole (NIZORAL) 2 % shampoo Apply 1 application topically 2 (two) times a week. 03/17/17  Yes Arnoldo Morale, MD  KLOR-CON M20 20 MEQ tablet TAKE 1 TABLET TWICE A DAY 10/19/16  Yes Deboraha Sprang, MD  metoprolol succinate (TOPROL-XL) 50 MG 24 hr tablet Take 1 tablet (50 mg total) by mouth daily. 07/16/16  Yes Deboraha Sprang, MD  sacubitril-valsartan (ENTRESTO) 97-103 MG Take 1 tablet by mouth 2 (two) times daily. 07/16/16  Yes Deboraha Sprang, MD  simvastatin (ZOCOR) 10 MG tablet Take 1 tablet (10 mg total) by mouth every evening. 07/01/16  Yes Deboraha Sprang, MD  spironolactone (ALDACTONE) 25 MG tablet Take 0.5 tablets (12.5 mg total) by mouth daily. 08/30/16  Yes Deboraha Sprang, MD  cyclobenzaprine (FLEXERIL) 10 MG tablet Take 1 tablet (10 mg total) by mouth 2 (two) times daily as needed for muscle spasms. Patient not taking: Reported on 04/09/2017 03/15/17   Arnoldo Morale, MD  ibuprofen (ADVIL,MOTRIN) 600 MG tablet Take 1 tablet (600 mg total) by mouth every 6 (six) hours as needed. Patient not taking: Reported on 04/09/2017 02/07/17   Woodroe Mode, MD    Family History Family History  Problem Relation Age of Onset  . Cancer Maternal Grandmother        uterine  . Hypertension Sister   . Other Neg Hx   . Heart disease Neg Hx     Social History Social History   Tobacco Use  . Smoking status: Former Smoker    Packs/day: 0.10    Years: 30.00    Pack years: 3.00    Types: Cigarettes    Last attempt to quit: 06/03/2015    Years since quitting: 1.8  . Smokeless tobacco: Never Used  Substance Use Topics  . Alcohol use: No    Alcohol/week: 0.0 oz  . Drug use: No     Allergies   Ace  inhibitors   Review of Systems Review of Systems  Constitutional: Negative for chills and fever.  Respiratory: Negative for cough, chest tightness and shortness of breath.   Cardiovascular: Negative for chest pain, palpitations and leg swelling.  Gastrointestinal: Negative for abdominal pain, diarrhea, nausea and vomiting.  Genitourinary: Negative for dysuria and flank pain.  Skin: Positive for rash.  Neurological: Negative for dizziness, syncope, light-headedness and headaches.  All other systems reviewed and are negative.    Physical Exam Updated Vital Signs BP 122/79   Pulse 79   Temp  98.7 F (37.1 C) (Oral)   Resp 20   Ht 5\' 8"  (1.727 m)   Wt 90.7 kg (200 lb)   SpO2 100%   BMI 30.41 kg/m   Physical Exam  Constitutional: She appears well-developed and well-nourished. No distress.  HENT:  Head: Normocephalic and atraumatic.  Mouth/Throat: Oropharynx is clear and moist.  No angioedema.  No swelling or erythema of posterior pharynx.  Eyes: Conjunctivae and EOM are normal. Pupils are equal, round, and reactive to light.  Neck: Normal range of motion. Neck supple.  Cardiovascular: Normal rate, regular rhythm, S1 normal and S2 normal.  No murmur heard. Pulmonary/Chest: Effort normal and breath sounds normal. She has no wheezes. She has no rales.  Abdominal: Soft. She exhibits no distension. There is no tenderness. There is no guarding.  Musculoskeletal: Normal range of motion. She exhibits no edema or deformity.  Lymphadenopathy:    She has no cervical adenopathy.  Neurological: She is alert.  Cranial nerves grossly intact. Patient was extremities symmetrically and with good coordination.  Skin: Skin is warm and dry. Rash noted. No erythema.  Urticaria noted on bilateral cheeks, chest, and posterior thorax.  Psychiatric: She has a normal mood and affect. Her behavior is normal. Judgment and thought content normal.  Nursing note and vitals reviewed.    ED Treatments  / Results  Labs (all labs ordered are listed, but only abnormal results are displayed) Labs Reviewed  CBG MONITORING, ED - Abnormal; Notable for the following components:      Result Value   Glucose-Capillary 121 (*)    All other components within normal limits    EKG  EKG Interpretation None       Radiology No results found.  Procedures Procedures (including critical care time)  Medications Ordered in ED Medications  diphenhydrAMINE (BENADRYL) capsule 25 mg (25 mg Oral Given 04/09/17 2325)  famotidine (PEPCID) tablet 20 mg (20 mg Oral Given 04/09/17 2325)  predniSONE (DELTASONE) tablet 60 mg (60 mg Oral Given 04/09/17 2325)     Initial Impression / Assessment and Plan / ED Course  I have reviewed the triage vital signs and the nursing notes.  Pertinent labs & imaging results that were available during my care of the patient were reviewed by me and considered in my medical decision making (see chart for details).    Final Clinical Impressions(s) / ED Diagnoses   Final diagnoses:  Urticaria   Patient is nontoxic-appearing and in no acute distress.  Patient is non-tachycardic and is satting 100% on room air.  As patient only has the dermatologic system involved in her allergic reaction, patient does not meet criteria for anaphylaxis.  The distribution of the urticaria suggest that this is a possible ingestion allergen and not contact dermatitis.  Patient does not exhibit angioedema, has no symptoms of swelling of the posterior pharynx, and is not wheezing or short of breath.  Symptoms are amenable to oral treatment with antihistamines and glucocorticoids.  Will prescribe outpatient prescription for prednisone, Benadryl, and Pepcid.  Return precautions given to patient for any oral pharyngeal swelling, shortness of breath, or wheezing.  Patient is in understanding and agrees with the plan of care.  This is a shared visit with Dr. Zenovia Jarred. Patient was independently  evaluated by this attending physician. Attending physician consulted in evaluation and discharge management.  ED Discharge Orders    None       Tamala Julian 04/10/17 0048    Macarthur Critchley, MD  04/10/17 2203  

## 2017-04-10 NOTE — Discharge Instructions (Signed)
Please read and follow all provided instructions.  Your diagnoses today include:  1. Urticaria     Tests performed today include: Vital signs. See below for your results today.   Medications prescribed:   Take any prescribed medications only as directed.  You are prescribed prednisone, a steroid in the ED for allergic reaction This is a medication to help reduce inflammation in skin.  Common side effects include upset stomach/nausea. You may take this medicine with food if this occurs. Other side effects include restlessness, difficulty sleeping, and increased sweating. Call your healthcare provider if these do not resolve after finishing the medication.  This medicine may increase your blood sugar so additional careful monitoring is needed of blood sugar if you have diabetes. Call your healthcare provider for any signs/symtpoms of high blood sugar such as confusion, feeling sleepy, more thirst, more hunger, passing urine more often, flushing, fast breathing, or breath that smells like fruit.  Benadryl.  This medicine helps with itching.  It can make you sleepy, so please do not drive, operate heavy machinery, or combine with alcohol.  Pepcid.  This is a medicine that also works to prevent allergy.  You will take this twice a day for 3 days.  Home care instructions:  Follow any educational materials contained in this packet  Follow-up instructions: Please follow-up with your primary care provider in the next 3 days for further evaluation of your symptoms.   Return instructions:  Please return to the Emergency Department if you experience worsening symptoms.  Call 9-1-1 immediately if you have an allergic reaction that involves your lips, mouth, throat or if you have any difficulty breathing. This is a life-threatening emergency.  Please return if you have any other emergent concerns.  Additional Information:  Your vital signs today were: BP 122/79    Pulse 79    Temp 98.7 F  (37.1 C) (Oral)    Resp 20    Ht 5\' 8"  (1.727 m)    Wt 90.7 kg (200 lb)    SpO2 100%    BMI 30.41 kg/m  If your blood pressure (BP) was elevated above 130/80 this visit, please have this repeated by your doctor within one month. --------------  Thank you for allowing [place] to participate in your care today!

## 2017-04-29 ENCOUNTER — Ambulatory Visit: Payer: Medicaid Other | Admitting: Family Medicine

## 2017-05-10 ENCOUNTER — Telehealth: Payer: Self-pay | Admitting: Internal Medicine

## 2017-05-10 NOTE — Telephone Encounter (Signed)
Pt is aware and agreeable to improved echo results

## 2017-05-10 NOTE — Telephone Encounter (Signed)
Chelsea Branch is calling to get her results of her test . Please call at [phone]

## 2017-05-10 NOTE — Telephone Encounter (Signed)
lmtcb for echo resutls

## 2017-05-13 ENCOUNTER — Ambulatory Visit: Payer: Medicaid Other | Admitting: Family Medicine

## 2017-06-07 ENCOUNTER — Encounter: Payer: Self-pay | Admitting: Physician Assistant

## 2017-06-20 NOTE — Progress Notes (Signed)
Cardiology Office Note Date:  06/23/2017  Patient ID:  Chelsea Branch, Chelsea Branch [birth year], MRN [id number] PCP:  Charlott Rakes, MD  Cardiologist:  Dr. Caryl Comes    Chief Complaint: planned f/u  History of Present Illness: Chelsea Branch is a 52 y.o. female with history of NICM, chronic CHF (combined), HTN, HLD, NSTEMI with no obst CAD by cath in 2017, OSA w/CPAP.  She comes today to be seen for dr. Caryl Comes, last seen by him in Oct 2018, at that time doing well, noted significant dietary indiscretions, particularly a weakness for [employer].  She was planned for an echo to revisit possible need for ICD.   Her echo noted LVEF 40-45%.  She is doing OK, generally tired, not sleeping well, wakes a few times a night, tosses/turns.  She reports she moved quite a while ago, misplaced her CPAP machine, also wakes at least on o the time to use the BR.  Not waking SOB.  No CP, palpitations.  She works 6 days a week, cares for her home, does her shopping and ADLs without difficulty or exertional intolerances.  No dizziness, near syncope or syncope.  She has been out of her [name] for a few week,s but was tolerating well.    She feels bloated like she may be retaining fluid.  We discussed daily weights, she is not, discussed diet/low sodium, had a meatball sub for lunch yesterday.  We spent a lot of time discussing/educating on the importance of daily AM weights and minimizing her sodium intake.  Discussed taking extra 1/2 tab of lasix for weight gain of 3lb in a day or 5 in a week, and if she finds need for this routinely to make [place] aware for labs/K+ needs, and re-evaluation of management.  I suspect her diet is a significant factor.  Past Medical History:  Diagnosis Date  . Abnormal uterine bleeding   . Alopecia   . Anemia   . Angina pectoris with normal coronary arteriogram ([place]) 2017   Had + Troponin c/w ? NSTEMI due to A on C CHF  . 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 Chronic combined systolic and  diastolic CHF (congestive heart failure) ([place])   . DYSLIPIDEMIA   . Fibroids   . H/O noncompliance with medical treatment, presenting hazards to health   . Hypertension   . Menorrhagia   . Nonischemic cardiomyopathy ([place]) 1994; 2017   a. iniatially ?2/2 peripartum in 1994 - improved by 2008 then worsening EF in 2011 back down to EF 25-30%. b. echo 01/21/14 showed mod LVH, [name]; [name], [name], [name],   . NSTEMI (non-ST elevated myocardial infarction) ([place]) 05/2015   Normal Coronaries.  . Peripartum cardiomyopathy 1994  . Sleep apnea 2015   CPAP 12/2013  . Stroke [facility]) 2011  . Tobacco abuse     Past Surgical History:  Procedure Laterality Date  . CARDIAC CATHETERIZATION N/A 06/16/2015   Procedure: Left Heart Cath and Coronary Angiography;  Surgeon: Jettie Booze, MD;  Location: [place] [facility];  Service: Cardiovascular;  Laterality: N/A;  . [place]  . LOOP RECORDER IMPLANT  ~ 2000  . TIBIAL TUBERCLERPLASTY    . TUBAL LIGATION  1994    Current Outpatient Medications  Medication Sig Dispense Refill  . aspirin 81 MG EC tablet Take 1 tablet (81 mg total) by mouth daily. 30 tablet 0  . Biotin 10000 MCG TABS Take 1 tablet by mouth  daily. 30 tablet 11  . cyclobenzaprine (FLEXERIL) 10 MG tablet Take 1 tablet (10 mg total) by mouth 2 (two) times daily as needed for muscle spasms. 60 tablet 1  . diphenhydrAMINE (BENADRYL) 25 mg capsule Take 1 capsule (25 mg total) every 6 (six) hours as needed by mouth for itching or allergies. 12 capsule 0  . famotidine (PEPCID) 20 MG tablet Take 1 tablet (20 mg total) 2 (two) times daily by mouth. 6 tablet 0  . furosemide (LASIX) 40 MG tablet Take 1.5 tablet (60 mg)by mouth in the AM and take an additional 1 tablet (40mg ) by mouth in the PM if necessary 75 tablet 11  . ibuprofen (ADVIL,MOTRIN) 600 MG tablet Take 1 tablet (600 mg total) by mouth every 6 (six) hours as needed. 30 tablet 1  .  ketoconazole (NIZORAL) 2 % shampoo Apply 1 application topically 2 (two) times a week. 120 mL 0  . metoprolol succinate (TOPROL-XL) 50 MG 24 hr tablet Take 1 tablet (50 mg total) by mouth daily. 30 tablet 11  . potassium chloride SA (KLOR-CON M20) 20 MEQ tablet Take 1 tablet (20 mEq total) by mouth 2 (two) times daily. 60 tablet 11  . predniSONE (DELTASONE) 10 MG tablet Take 2 tablets (20 mg total) daily by mouth. Take 4 tablets (40 mg) tomorrow and the next day, then 2 tablets (20 mg) the next day, then one tab (10 mg) the last. We gave you your first dose in the emergency department today. 11 tablet 0  . sacubitril-valsartan (ENTRESTO) 97-103 MG Take 1 tablet by mouth 2 (two) times daily. 60 tablet 11  . simvastatin (ZOCOR) 10 MG tablet Take 1 tablet (10 mg total) by mouth every evening. 30 tablet 11  . spironolactone (ALDACTONE) 25 MG tablet Take 0.5 tablets (12.5 mg total) by mouth daily. 30 tablet 11   No current facility-administered medications for this visit.     Allergies:   Ace inhibitors   Social History:  The patient  reports that she quit smoking about 2 years ago. Her smoking use included cigarettes. She has a 3.00 pack-year smoking history. she has never used smokeless tobacco. She reports that she does not drink alcohol or use drugs.   Family History:  The patient's family history includes Cancer in her maternal grandmother; Hypertension in her sister.  ROS:  Please see the history of present illness.  All other systems are reviewed and otherwise negative.   PHYSICAL EXAM:  VS:  BP 120/76   Pulse 85   Ht 5\' 8"  (1.727 m)   Wt 210 lb (95.3 kg)   BMI 31.93 kg/m  BMI: Body mass index is 31.93 kg/m. Well nourished, well developed, in no acute distress  HEENT: normocephalic, atraumatic  Neck: no JVD, carotid bruits or masses Cardiac:  RRR; no significant murmurs, no rubs, or gallops Lungs:  CTA b/l, no wheezing, rhonchi or rales  Abd: soft, non-tender, obese MS: no  deformity or atrophy Ext: no edema  Skin: warm and dry, no rash Neuro:  No gross deficits appreciated Psych: euthymic mood, full affect   EKG:  Not done today  03/29/17: TTE Study Conclusions - Left ventricle: The cavity size was normal. There was mild   concentric hypertrophy. Systolic function was mildly to   moderately reduced. The estimated ejection fraction was in the   range of 40% to 45%. Diffuse hypokinesis. Doppler parameters are   consistent with abnormal left ventricular relaxation (grade 1   diastolic dysfunction). -  Left atrium: The atrium was mildly dilated.  06/15/16: TTE: LVEF 30-35% 06/15/15: TTE: LVEF 25-30% 01/21/14: TTE: LVEF 50-55% 07/31/13: TTE: LVEF 25-30%  06/16/15:L LHC  There is severe left ventricular systolic dysfunction.  No significant CAD.  LVEDP elevated.   Recent Labs: 03/15/2017: TSH 0.453 03/29/2017: BUN 10; Creatinine, Ser 0.74; Potassium 3.1; Sodium 141  No results found for requested labs within last 8760 hours.   CrCl cannot be calculated (Patient's most recent lab result is older than the maximum 21 days allowed.).   Wt Readings from Last 3 Encounters:  06/23/17 210 lb (95.3 kg)  04/09/17 200 lb (90.7 kg)  03/21/17 207 lb (93.9 kg)     Other studies reviewed: Additional studies/records reviewed today include: summarized above  ASSESSMENT AND PLAN:  1. NICM     LVEF most recently up to 40-45%     on BB, Entresto, lasix/K+, aldactone  2. Chronic CHF     Her weight is up, though symptoms/exam do not suggest overt fluid OL, she does report feeling bloated     Take extra 1/2 tab lasix for next 2 days  BMET today  I suspect significant dietary indiscretions as well as a component of medicine non-compliance.  WE discussed these things at length today  3. HTN     No changes  4. HLD     Labs are followed with her PMD per the patient  5. OSA     Currently untreated, lsot her machine in a move     She is urged to revisit  with her PMD and get her CPAP replaced.   Disposition: F/u 6 months, sooner if needed  Current medicines are reviewed at length with the patient today.  The patient did not have any concerns regarding medicines.  Venetia Night, PA-C 06/23/2017 4:04 PM     [facility] [address] [place] [address] [phone] (office)  [phone] (fax)

## 2017-06-23 ENCOUNTER — Ambulatory Visit (INDEPENDENT_AMBULATORY_CARE_PROVIDER_SITE_OTHER): Payer: Medicaid Other | Admitting: Physician Assistant

## 2017-06-23 VITALS — BP 120/76 | HR 85 | Ht 68.0 in | Wt 210.0 lb

## 2017-06-23 DIAGNOSIS — I5022 Chronic systolic (congestive) heart failure: Secondary | ICD-10-CM

## 2017-06-23 DIAGNOSIS — G4730 Sleep apnea, unspecified: Secondary | ICD-10-CM | POA: Diagnosis not present

## 2017-06-23 DIAGNOSIS — Z79899 Other long term (current) drug therapy: Secondary | ICD-10-CM | POA: Diagnosis not present

## 2017-06-23 DIAGNOSIS — I10 Essential (primary) hypertension: Secondary | ICD-10-CM | POA: Diagnosis not present

## 2017-06-23 DIAGNOSIS — I428 Other cardiomyopathies: Secondary | ICD-10-CM

## 2017-06-23 MED ORDER — SIMVASTATIN 10 MG PO TABS: 10.0000 mg | ORAL_TABLET | Freq: Every evening | ORAL | 11 refills | Status: DC

## 2017-06-23 MED ORDER — FUROSEMIDE 40 MG PO TABS: ORAL_TABLET | ORAL | 11 refills | Status: DC

## 2017-06-23 MED ORDER — SACUBITRIL-VALSARTAN 97-103 MG PO TABS: 1.0000 | ORAL_TABLET | Freq: Two times a day (BID) | ORAL | 11 refills | Status: DC

## 2017-06-23 MED ORDER — SPIRONOLACTONE 25 MG PO TABS: 12.5000 mg | ORAL_TABLET | Freq: Every day | ORAL | 11 refills | Status: DC

## 2017-06-23 MED ORDER — POTASSIUM CHLORIDE CRYS ER 20 MEQ PO TBCR: 20.0000 meq | EXTENDED_RELEASE_TABLET | Freq: Two times a day (BID) | ORAL | 11 refills | Status: DC

## 2017-06-23 MED ORDER — METOPROLOL SUCCINATE ER 50 MG PO TB24: 50.0000 mg | ORAL_TABLET | Freq: Every day | ORAL | 11 refills | Status: DC

## 2017-06-23 NOTE — Patient Instructions (Addendum)
Medication Instructions:   Your physician recommends that you continue on your current medications as directed.   FOR WEIGHT GAIN IN A WEEK  OF 5 LBS  AND 3 LBS IN  A DAY  TAKE AN EXTRA  1/2 TABLET OF LASIX FOR 2 DAYS ONLY AND RESUME BACK TO NORMAL DOSE.   If you need a refill on your cardiac medications before your next appointment, please call your pharmacy.  Labwork:  BMET  TODAY    Testing/Procedures: NONE ORDERED  TODAY    Follow-Up:  Your physician wants you to follow-up in:  IN  [place] will receive a reminder letter in the mail two months in advance. If you don't receive a letter, please call our office to schedule the follow-up appointment.      Any Other Special Instructions Will Be Listed Below (If Applicable).  MAKE SURE YOU DO  DAILY WEIGHTS FOR  YOUR SELF   Low-Sodium Eating Plan Sodium, which is an element that makes up salt, helps you maintain a healthy balance of fluids in your body. Too much sodium can increase your blood pressure and cause fluid and waste to be held in your body. Your health care provider or dietitian may recommend following this plan if you have high blood pressure (hypertension), kidney disease, liver disease, or heart failure. Eating less sodium can help lower your blood pressure, reduce swelling, and protect your heart, liver, and kidneys. What are tips for following this plan? General guidelines  Most people on this plan should limit their sodium intake to 1,500-2,000 mg (milligrams) of sodium each day. Reading food labels  The Nutrition Facts label lists the amount of sodium in one serving of the food. If you eat more than one serving, you must multiply the listed amount of sodium by the number of servings.  Choose foods with less than 140 mg of sodium per serving.  Avoid foods with 300 mg of sodium or more per serving. Shopping  Look for lower-sodium products, often labeled as "low-sodium" or "no salt  added."  Always check the sodium content even if foods are labeled as "unsalted" or "no salt added".  Buy fresh foods. ? Avoid canned foods and premade or frozen meals. ? Avoid canned, cured, or processed meats  Buy breads that have less than 80 mg of sodium per slice. Cooking  Eat more home-cooked food and less restaurant, buffet, and fast food.  Avoid adding salt when cooking. Use salt-free seasonings or herbs instead of table salt or sea salt. Check with your health care provider or pharmacist before using salt substitutes.  Cook with plant-based oils, such as canola, sunflower, or olive oil. Meal planning  When eating at a restaurant, ask that your food be prepared with less salt or no salt, if possible.  Avoid foods that contain MSG (monosodium glutamate). MSG is sometimes added to [place] food, bouillon, and some canned foods. What foods are recommended? The items listed may not be a complete list. Talk with your dietitian about what dietary choices are best for you. Grains Low-sodium cereals, including oats, puffed wheat and rice, and shredded wheat. Low-sodium crackers. Unsalted rice. Unsalted pasta. Low-sodium bread. Whole-grain breads and whole-grain pasta. Vegetables Fresh or frozen vegetables. "No salt added" canned vegetables. "No salt added" tomato sauce and paste. Low-sodium or reduced-sodium tomato and vegetable juice. Fruits Fresh, frozen, or canned fruit. Fruit juice. Meats and other protein foods Fresh or frozen (no salt added) meat, poultry, seafood,  and fish. Low-sodium canned tuna and salmon. Unsalted nuts. Dried peas, beans, and lentils without added salt. Unsalted canned beans. Eggs. Unsalted nut butters. Dairy Milk. Soy milk. Cheese that is naturally low in sodium, such as ricotta cheese, fresh mozzarella, or Swiss cheese Low-sodium or reduced-sodium cheese. Cream cheese. Yogurt. Fats and oils Unsalted butter. Unsalted margarine with no trans fat. Vegetable  oils such as canola or olive oils. Seasonings and other foods Fresh and dried herbs and spices. Salt-free seasonings. Low-sodium mustard and ketchup. Sodium-free salad dressing. Sodium-free light mayonnaise. Fresh or refrigerated horseradish. Lemon juice. Vinegar. Homemade, reduced-sodium, or low-sodium soups. Unsalted popcorn and pretzels. Low-salt or salt-free chips. What foods are not recommended? The items listed may not be a complete list. Talk with your dietitian about what dietary choices are best for you. Grains Instant hot cereals. Bread stuffing, pancake, and biscuit mixes. Croutons. Seasoned rice or pasta mixes. Noodle soup cups. Boxed or frozen macaroni and cheese. Regular salted crackers. Self-rising flour. Vegetables Sauerkraut, pickled vegetables, and relishes. Olives. [place] fries. Onion rings. Regular canned vegetables (not low-sodium or reduced-sodium). Regular canned tomato sauce and paste (not low-sodium or reduced-sodium). Regular tomato and vegetable juice (not low-sodium or reduced-sodium). Frozen vegetables in sauces. Meats and other protein foods Meat or fish that is salted, canned, smoked, spiced, or pickled. Bacon, ham, sausage, hotdogs, corned beef, chipped beef, packaged lunch meats, salt pork, jerky, pickled herring, anchovies, regular canned tuna, sardines, salted nuts. Dairy Processed cheese and cheese spreads. Cheese curds. Blue cheese. Feta cheese. String cheese. Regular cottage cheese. Buttermilk. Canned milk. Fats and oils Salted butter. Regular margarine. Ghee. Bacon fat. Seasonings and other foods Onion salt, garlic salt, seasoned salt, table salt, and sea salt. Canned and packaged gravies. Worcestershire sauce. Tartar sauce. Barbecue sauce. Teriyaki sauce. Soy sauce, including reduced-sodium. Steak sauce. Fish sauce. Oyster sauce. Cocktail sauce. Horseradish that you find on the shelf. Regular ketchup and mustard. Meat flavorings and tenderizers. Bouillon cubes.  Hot sauce and Tabasco sauce. Premade or packaged marinades. Premade or packaged taco seasonings. Relishes. Regular salad dressings. Salsa. Potato and tortilla chips. Corn chips and puffs. Salted popcorn and pretzels. Canned or dried soups. Pizza. Frozen entrees and pot pies. Summary  Eating less sodium can help lower your blood pressure, reduce swelling, and protect your heart, liver, and kidneys.  Most people on this plan should limit their sodium intake to 1,500-2,000 mg (milligrams) of sodium each day.  Canned, boxed, and frozen foods are high in sodium. Restaurant foods, fast foods, and pizza are also very high in sodium. You also get sodium by adding salt to food.  Try to cook at home, eat more fresh fruits and vegetables, and eat less fast food, canned, processed, or prepared foods. This information is not intended to replace advice given to you by your health care provider. Make sure you discuss any questions you have with your health care provider. Document Released: 10/30/2001 Document Revised: 05/03/2016 Document Reviewed: 05/03/2016 Elsevier Interactive Patient Education  [employer].

## 2017-06-24 ENCOUNTER — Other Ambulatory Visit: Payer: Self-pay | Admitting: Physician Assistant

## 2017-06-24 ENCOUNTER — Telehealth: Payer: Self-pay | Admitting: Physician Assistant

## 2017-06-24 DIAGNOSIS — E876 Hypokalemia: Secondary | ICD-10-CM

## 2017-06-24 DIAGNOSIS — Z79899 Other long term (current) drug therapy: Secondary | ICD-10-CM

## 2017-06-24 LAB — BASIC METABOLIC PANEL
BUN / CREAT RATIO: 11 (ref 9–23)
BUN: 9 mg/dL (ref 6–24)
CO2: 30 mmol/L — ABNORMAL HIGH (ref 20–29)
Calcium: 9.4 mg/dL (ref 8.7–10.2)
Chloride: 96 mmol/L (ref 96–106)
Creatinine, Ser: 0.79 mg/dL (ref 0.57–1.00)
GFR calc non Af Amer: 86 mL/min/{1.73_m2} (ref >59)
GFR, EST AFRICAN AMERICAN: 100 mL/min/{1.73_m2} (ref >59)
Glucose: 169 mg/dL — ABNORMAL HIGH (ref 65–99)
POTASSIUM: 2.9 mmol/L — AB (ref 3.5–5.2)
Sodium: 144 mmol/L (ref 134–144)

## 2017-06-27 ENCOUNTER — Other Ambulatory Visit: Payer: Medicaid Other

## 2017-06-27 NOTE — Telephone Encounter (Signed)
Opened in error

## 2017-06-28 ENCOUNTER — Telehealth: Payer: Self-pay | Admitting: Physician Assistant

## 2017-06-28 ENCOUNTER — Ambulatory Visit: Payer: Medicaid Other | Attending: Family Medicine | Admitting: Family Medicine

## 2017-06-28 ENCOUNTER — Other Ambulatory Visit: Payer: Medicaid Other

## 2017-06-28 ENCOUNTER — Encounter: Payer: Self-pay | Admitting: Family Medicine

## 2017-06-28 ENCOUNTER — Other Ambulatory Visit: Payer: Self-pay | Admitting: Physician Assistant

## 2017-06-28 VITALS — BP 120/84 | HR 90 | Temp 98.6°F | Ht 68.0 in | Wt 210.6 lb

## 2017-06-28 DIAGNOSIS — G8929 Other chronic pain: Secondary | ICD-10-CM | POA: Diagnosis not present

## 2017-06-28 DIAGNOSIS — I429 Cardiomyopathy, unspecified: Secondary | ICD-10-CM | POA: Diagnosis not present

## 2017-06-28 DIAGNOSIS — I5042 Chronic combined systolic (congestive) and diastolic (congestive) heart failure: Secondary | ICD-10-CM | POA: Insufficient documentation

## 2017-06-28 DIAGNOSIS — I110 Hypertensive heart disease with heart failure: Secondary | ICD-10-CM | POA: Insufficient documentation

## 2017-06-28 DIAGNOSIS — I252 Old myocardial infarction: Secondary | ICD-10-CM | POA: Diagnosis not present

## 2017-06-28 DIAGNOSIS — G4730 Sleep apnea, unspecified: Secondary | ICD-10-CM | POA: Diagnosis not present

## 2017-06-28 DIAGNOSIS — Z79899 Other long term (current) drug therapy: Secondary | ICD-10-CM | POA: Diagnosis not present

## 2017-06-28 DIAGNOSIS — Z8673 Personal history of transient ischemic attack (TIA), and cerebral infarction without residual deficits: Secondary | ICD-10-CM | POA: Insufficient documentation

## 2017-06-28 DIAGNOSIS — M5441 Lumbago with sciatica, right side: Secondary | ICD-10-CM | POA: Insufficient documentation

## 2017-06-28 DIAGNOSIS — M5442 Lumbago with sciatica, left side: Secondary | ICD-10-CM | POA: Diagnosis not present

## 2017-06-28 DIAGNOSIS — L659 Nonscarring hair loss, unspecified: Secondary | ICD-10-CM | POA: Diagnosis not present

## 2017-06-28 DIAGNOSIS — E876 Hypokalemia: Secondary | ICD-10-CM

## 2017-06-28 DIAGNOSIS — Z7982 Long term (current) use of aspirin: Secondary | ICD-10-CM | POA: Insufficient documentation

## 2017-06-28 DIAGNOSIS — M545 Low back pain: Secondary | ICD-10-CM | POA: Diagnosis present

## 2017-06-28 MED ORDER — KETOCONAZOLE 2 % EX SHAM: 1.0000 "application " | MEDICATED_SHAMPOO | CUTANEOUS | 1 refills | Status: DC

## 2017-06-28 MED ORDER — CYCLOBENZAPRINE HCL 10 MG PO TABS: 10.0000 mg | ORAL_TABLET | Freq: Two times a day (BID) | ORAL | 1 refills | Status: DC | PRN

## 2017-06-28 MED ORDER — MINOXIDIL 2 % EX SOLN: Freq: Two times a day (BID) | CUTANEOUS | 1 refills | Status: DC

## 2017-06-28 NOTE — Progress Notes (Signed)
Subjective:  [name], female    DOB: 05-19-1966  Age: 52 y.o. MRN: [id number]  CC: Back Pain and Leg Pain   HPI Chelsea Branch is a 52 year old female with a history of CHF (EF 40-45% from echo of 03/2017), hypertension who presents today for follow-up of low back pain and alopecia.  She complains of persisting itching of her scalp and hair loss has not improved; this dates back to over 1 year ago and she has been on Nizoral shampoo but is unable to tell if there has been some improvement.  She also has low back pain which she initially stated did not radiate but today informs me radiates down both lower extremities intermittently.  Cyclobenzaprine does help her symptoms. Denies loss of sphincteric function, no recent falls.  Seen by cardiology 1 week ago for management of heart failure at which time labs revealed hypokalemia of 2.9 and she is due for repeat blood test today. She denies shortness of breath, pedal edema, chest pains.  Echocardiogram 03/29/2017 Study Conclusions  - Left ventricle: The cavity size was normal. There was mild   concentric hypertrophy. Systolic function was mildly to   moderately reduced. The estimated ejection fraction was in the   range of 40% to 45%. Diffuse hypokinesis. Doppler parameters are   consistent with abnormal left ventricular relaxation (grade 1   diastolic dysfunction). - Left atrium: The atrium was mildly dilated.  Past Medical History:  Diagnosis Date  . Abnormal uterine bleeding   . Alopecia   . Anemia   . Angina pectoris with normal coronary arteriogram ([place]) 2017   Had + Troponin c/w ? NSTEMI due to A on C CHF  . 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 Chronic combined systolic and diastolic CHF (congestive heart failure) ([place])   . DYSLIPIDEMIA   . Fibroids   . H/O noncompliance with medical treatment, presenting hazards to health   . Hypertension   . Menorrhagia   . Nonischemic cardiomyopathy ([place]) 1994; 2017    a. iniatially ?2/2 peripartum in 1994 - improved by 2008 then worsening EF in 2011 back down to EF 25-30%. b. echo 01/21/14 showed mod LVH, [name]; [name], [name], [name],   . NSTEMI (non-ST elevated myocardial infarction) ([place]) 05/2015   Normal Coronaries.  . Peripartum cardiomyopathy 1994  . Sleep apnea 2015   CPAP 12/2013  . Stroke [facility]) 2011  . Tobacco abuse     Past Surgical History:  Procedure Laterality Date  . CARDIAC CATHETERIZATION N/A 06/16/2015   Procedure: Left Heart Cath and Coronary Angiography;  Surgeon: Jettie Booze, MD;  Location: [facility];  Service: Cardiovascular;  Laterality: N/A;  . [place]  . LOOP RECORDER IMPLANT  ~ 2000  . TIBIAL TUBERCLERPLASTY    . TUBAL LIGATION  1994    Allergies  Allergen Reactions  . Ace Inhibitors Other (See Comments)    REACTION: Cough     Outpatient Medications Prior to Visit  Medication Sig Dispense Refill  . aspirin 81 MG EC tablet Take 1 tablet (81 mg total) by mouth daily. 30 tablet 0  . furosemide (LASIX) 40 MG tablet Take 1.5 tablet (60 mg)by mouth in the AM and take an additional 1 tablet (40mg ) by mouth in the PM if necessary 75 tablet 11  . ibuprofen (ADVIL,MOTRIN) 600 MG tablet Take 1 tablet (600 mg total) by mouth every 6 (six) hours as  needed. 30 tablet 1  . metoprolol succinate (TOPROL-XL) 50 MG 24 hr tablet Take 1 tablet (50 mg total) by mouth daily. 30 tablet 11  . potassium chloride SA (KLOR-CON M20) 20 MEQ tablet Take 1 tablet (20 mEq total) by mouth 2 (two) times daily. 60 tablet 11  . sacubitril-valsartan (ENTRESTO) 97-103 MG Take 1 tablet by mouth 2 (two) times daily. 60 tablet 11  . simvastatin (ZOCOR) 10 MG tablet Take 1 tablet (10 mg total) by mouth every evening. 30 tablet 11  . spironolactone (ALDACTONE) 25 MG tablet Take 0.5 tablets (12.5 mg total) by mouth daily. 30 tablet 11  . cyclobenzaprine (FLEXERIL) 10 MG tablet Take 1 tablet (10 mg  total) by mouth 2 (two) times daily as needed for muscle spasms. 60 tablet 1  . ketoconazole (NIZORAL) 2 % shampoo Apply 1 application topically 2 (two) times a week. 120 mL 0  . Biotin 10000 MCG TABS Take 1 tablet by mouth daily. (Patient not taking: Reported on 06/28/2017) 30 tablet 11  . diphenhydrAMINE (BENADRYL) 25 mg capsule Take 1 capsule (25 mg total) every 6 (six) hours as needed by mouth for itching or allergies. (Patient not taking: Reported on 06/28/2017) 12 capsule 0  . famotidine (PEPCID) 20 MG tablet Take 1 tablet (20 mg total) 2 (two) times daily by mouth. (Patient not taking: Reported on 06/28/2017) 6 tablet 0  . predniSONE (DELTASONE) 10 MG tablet Take 2 tablets (20 mg total) daily by mouth. Take 4 tablets (40 mg) tomorrow and the next day, then 2 tablets (20 mg) the next day, then one tab (10 mg) the last. We gave you your first dose in the emergency department today. (Patient not taking: Reported on 06/28/2017) 11 tablet 0   No facility-administered medications prior to visit.     ROS Review of Systems  Constitutional: Negative for activity change, appetite change and fatigue.  HENT: Negative for congestion, sinus pressure and sore throat.   Eyes: Negative for visual disturbance.  Respiratory: Negative for cough, chest tightness, shortness of breath and wheezing.   Cardiovascular: Negative for chest pain and palpitations.  Gastrointestinal: Negative for abdominal distention, abdominal pain and constipation.  Endocrine: Negative for polydipsia.  Genitourinary: Negative for dysuria and frequency.  Musculoskeletal: Negative for arthralgias and back pain.  Skin: Negative for rash.  Neurological: Negative for tremors, light-headedness and numbness.  Hematological: Does not bruise/bleed easily.  Psychiatric/Behavioral: Negative for agitation and behavioral problems.    Objective:  BP 120/84   Pulse 90   Temp 98.6 F (37 C) (Oral)   Ht 5\' 8"  (1.727 m)   Wt 210 lb 9.6 oz (95.5  kg)   SpO2 96%   BMI 32.02 kg/m   BP/Weight 06/28/2017 06/23/2017 80/99/8338  Systolic BP 250 539 767  Diastolic BP 84 76 75  Wt. (Lbs) 210.6 210 -  BMI 32.02 31.93 -      Physical Exam  Constitutional: She is oriented to person, place, and time. She appears well-developed and well-nourished.  Cardiovascular: Normal rate, normal heart sounds and intact distal pulses.  No murmur heard. Pulmonary/Chest: Effort normal and breath sounds normal. She has no wheezes. She has no rales. She exhibits no tenderness.  Abdominal: Soft. Bowel sounds are normal. She exhibits no distension and no mass. There is no tenderness.  Musculoskeletal: Normal range of motion. She exhibits no tenderness.  Negative straight leg raise bilaterally  Neurological: She is alert and oriented to person, place, and time.  Skin:  Areas  of patchy alopecia on scalp  Psychiatric: She has a normal mood and affect.     Assessment & Plan:   1. Alopecia No significant improvement We will add on minoxidil-unsure if insurance will cover Refer to dermatology - minoxidil (MINOXIDIL FOR WOMEN) 2 % external solution; Apply topically 2 (two) times daily.  Dispense: 60 mL; Refill: 1 - ketoconazole (NIZORAL) 2 % shampoo; Apply 1 application topically 2 (two) times a week.  Dispense: 120 mL; Refill: 1 - Ambulatory referral to Dermatology  2. Chronic bilateral low back pain with bilateral sciatica Stable Apply heat Will perform lumbar spine x-ray as she could have underlying osteoarthritis - cyclobenzaprine (FLEXERIL) 10 MG tablet; Take 1 tablet (10 mg total) by mouth 2 (two) times daily as needed for muscle spasms.  Dispense: 60 tablet; Refill: 1 - DG Lumbar Spine Complete; Future  3. Chronic combined systolic and diastolic CHF (congestive heart failure) (HCC) EF 40-45% improved from 30-35%, 10 months prior Euvolemic Continue current medications Daily weights, low-sodium diet Follow-up with cardiology as  scheduled   Meds ordered this encounter  Medications  . minoxidil (MINOXIDIL FOR WOMEN) 2 % external solution    Sig: Apply topically 2 (two) times daily.    Dispense:  60 mL    Refill:  1  . ketoconazole (NIZORAL) 2 % shampoo    Sig: Apply 1 application topically 2 (two) times a week.    Dispense:  120 mL    Refill:  1  . cyclobenzaprine (FLEXERIL) 10 MG tablet    Sig: Take 1 tablet (10 mg total) by mouth 2 (two) times daily as needed for muscle spasms.    Dispense:  60 tablet    Refill:  1    Follow-up: Return in about 3 months (around 09/25/2017) for Follow-up of chronic medical conditions.   Charlott Rakes MD

## 2017-06-28 NOTE — Progress Notes (Signed)
Opened in error

## 2017-06-28 NOTE — Telephone Encounter (Signed)
Spoke with patient, she has been taking potassium as directed, misunderstood need for repeat lab draw, she will go to the office today for repeat K+ level.  Tommye Standard, PA-C

## 2017-06-28 NOTE — Patient Instructions (Signed)

## 2017-06-28 NOTE — Telephone Encounter (Signed)
Reviewed lab last week with Dr. Curt Bears, and spoke with the patient about recommendation to increase her potassium replacement from 69meq BID to 29meq BID through the weekend and have lab done Monday. (yesterday) No lab noted.  I called and left a message on cell number following up labs from last week and her missed appointment yesterday and need for lab to be done today.  Called home number, no answer, unable to leave message.  I have asked my MA to follow up today and get recheck on potassium level done today.  Tommye Standard, PA-C

## 2017-06-29 LAB — POTASSIUM: POTASSIUM: 3.4 mmol/L — AB (ref 3.5–5.2)

## 2017-07-01 ENCOUNTER — Telehealth: Payer: Self-pay | Admitting: *Deleted

## 2017-07-01 NOTE — Telephone Encounter (Signed)
LMOVM TO CALL CLINIC BACK ABOUT RESULTS AND MEDICINE CHANGES.

## 2017-07-01 NOTE — Telephone Encounter (Signed)
-----   Message from [facility], [place] sent at 06/29/2017  6:47 AM EST ----- Please let the patient know her potassium is improved, still just a little low.  Change her potassium to 26meq in the morning and 69meq in afternoon  (2 pills AM, 1 in PM) and recheck Friday please.    Make sure she knows to come back in, and please have her take an additional potassium on days she takes extra lasix.    Thanks [employer]

## 2017-07-05 ENCOUNTER — Telehealth: Payer: Self-pay | Admitting: *Deleted

## 2017-07-05 NOTE — Telephone Encounter (Signed)
LMOVM TO CALL OFFICE BACK ABOUT RESULTS  AND MEDICATION CHANGES

## 2017-07-05 NOTE — Telephone Encounter (Signed)
-----   Message from [facility], [place] sent at 06/29/2017  6:47 AM EST ----- Please let the patient know her potassium is improved, still just a little low.  Change her potassium to 3meq in the morning and 60meq in afternoon  (2 pills AM, 1 in PM) and recheck Friday please.    Make sure she knows to come back in, and please have her take an additional potassium on days she takes extra lasix.    Thanks [employer]

## 2017-07-07 ENCOUNTER — Telehealth: Payer: Self-pay | Admitting: Physician Assistant

## 2017-07-07 ENCOUNTER — Other Ambulatory Visit: Payer: Self-pay | Admitting: Physician Assistant

## 2017-07-07 DIAGNOSIS — E876 Hypokalemia: Secondary | ICD-10-CM

## 2017-07-07 NOTE — Telephone Encounter (Signed)
Called both home and cell numbers, no answer, left messages at both numbers that we have been trying to call regarding her last potassium level, medication recommendations and follow up labs, and is important that she call us back.     Tommye Standard, PA-C

## 2017-07-07 NOTE — Telephone Encounter (Signed)
Re-called and left f/u message at both home and cell numbers to come in to the office for potassium level/lab draw tomorrow, 07/08/16.  Order written, pt paced on lab schedule  Tommye Standard, [facility]

## 2017-07-08 ENCOUNTER — Telehealth: Payer: Self-pay | Admitting: *Deleted

## 2017-07-08 ENCOUNTER — Encounter: Payer: Self-pay | Admitting: *Deleted

## 2017-07-08 ENCOUNTER — Other Ambulatory Visit: Payer: Medicaid Other | Admitting: *Deleted

## 2017-07-08 DIAGNOSIS — E876 Hypokalemia: Secondary | ICD-10-CM

## 2017-07-08 NOTE — Telephone Encounter (Signed)
SPOKE WITH PT SON (DPR) AND EXPLAINED THE NEED TO GET IN TOUCH HIS MOTHER ABOUT LAB RESULTS AND LAB REDRAW.  PT SON SAID HE WILL GET THE MESSAGE TO HIS MOM TO CONTACT OFFICE BACK.

## 2017-07-09 LAB — BASIC METABOLIC PANEL
BUN / CREAT RATIO: 11 (ref 9–23)
BUN: 8 mg/dL (ref 6–24)
CO2: 24 mmol/L (ref 20–29)
CREATININE: 0.71 mg/dL (ref 0.57–1.00)
Calcium: 9.5 mg/dL (ref 8.7–10.2)
Chloride: 102 mmol/L (ref 96–106)
GFR calc non Af Amer: 98 mL/min/{1.73_m2} (ref >59)
GFR, EST AFRICAN AMERICAN: 113 mL/min/{1.73_m2} (ref >59)
Glucose: 118 mg/dL — ABNORMAL HIGH (ref 65–99)
Potassium: 3.5 mmol/L (ref 3.5–5.2)
SODIUM: 143 mmol/L (ref 134–144)

## 2017-07-12 ENCOUNTER — Telehealth: Payer: Self-pay | Admitting: Physician Assistant

## 2017-07-12 NOTE — Telephone Encounter (Signed)
Informed pt of lab results. Pt verbalized understanding. 

## 2017-07-12 NOTE — Telephone Encounter (Signed)
New Message   Patient is returning call in reference to her labs and potassium. Please call to discuss.

## 2017-07-19 ENCOUNTER — Telehealth: Payer: Self-pay | Admitting: Physician Assistant

## 2017-07-19 NOTE — Telephone Encounter (Signed)
Spoke with patient, she will come Thursday 07/21/17 for potassium level, she will reduce her Kdur to 41meq AM, 71meq (1/2 tab) PM.  Tommye Standard, PA-C

## 2017-07-20 ENCOUNTER — Other Ambulatory Visit: Payer: Self-pay | Admitting: *Deleted

## 2017-07-20 DIAGNOSIS — E876 Hypokalemia: Secondary | ICD-10-CM

## 2017-07-21 ENCOUNTER — Other Ambulatory Visit: Payer: Medicaid Other

## 2017-08-03 ENCOUNTER — Telehealth: Payer: Self-pay | Admitting: Internal Medicine

## 2017-08-03 NOTE — Telephone Encounter (Signed)
**Note De-Identified Chelsea Branch Obfuscation** The pt needs a PA done for this Entresto refill.  I have called [employer] at [phone] and did a PA over the phone with Clair Gulling. Per Clair Gulling we need to call them back in 24 hours to get their decision on this [place] PA.  PA# [id number]  The pt states that she is almost out of Entresto (has 5 pills left) and we do not have any samples. Hopefully this PA will be approved and the pt will be able to pick her refill up ASAP.   The pt is aware.

## 2017-08-03 NOTE — Telephone Encounter (Signed)
New message   Pharmacy told patient they needs a prior authorization for this medication   *STAT* If patient is at the pharmacy, call can be transferred to refill team.   1. Which medications need to be refilled? (please list name of each medication and dose if known)   sacubitril-valsartan (ENTRESTO) 97-103 MG Take 1 tablet by mouth 2 (two) times daily.     2. Which pharmacy/location (including street and city if local pharmacy) is medication to be sent to? [facility] [address]  3. Do they need a 30 day or 90 day supply?  Either will be fine

## 2017-08-04 NOTE — Telephone Encounter (Signed)
I called  [employer] to see if a decision has been made on approval for ENTRESTO, they state it initially is denied due to needing more clinical information. They have sent a 30 day maintenance service to pharmacy. I have called patient and left her a message on cell phone.

## 2017-08-04 NOTE — Telephone Encounter (Signed)
Faxed clinical information to [place] [employer]/Med[employer] for approval of ENTRESTO.

## 2017-08-10 ENCOUNTER — Telehealth: Payer: Self-pay | Admitting: Family Medicine

## 2017-08-10 NOTE — Telephone Encounter (Signed)
**Note De-Identified [place] Obfuscation** Denial on Entresto received Chelsea Branch fax from [facility]. Reason: EF of 40%-45% on most recent Echo on 03/29/17.  I called [place] [employer] and did another [place] PA with [employer]. I advised him that the pts EF was 25%-30%  per Echo on 06/15/15 and that since starting Entresto her EF is now 40%-45%. Per Clair Gulling we need to call them back in 24 hours for decision on [place] PA.  Also, per Clair Gulling, [place] [employer] has issued the pt a 30 day refill of Entresto so she should have more than enough until this matter is resolved.  PA# [id number] Interaction# [id number]  The pt is aware of all of the above.

## 2017-08-10 NOTE — Telephone Encounter (Signed)
Patient called to check up on referral for dermatology. Please fu [phone]

## 2017-08-11 NOTE — Telephone Encounter (Signed)
I called [place] [employer] to follow up with status of ENTRESTO, it has been approved dates 08/10/2017-08/05/2018. I will notify patient by phone.  Interaction # with [place] [id number]

## 2017-08-11 NOTE — Telephone Encounter (Signed)
Sent referral to  [facility] ph# [phone] Address [address]. They will contact the patient to schedule an appointment  In [facility] we don't have nobody else that accept Medicaid  Thank you

## 2017-09-15 ENCOUNTER — Other Ambulatory Visit: Payer: Self-pay

## 2017-09-15 ENCOUNTER — Ambulatory Visit (INDEPENDENT_AMBULATORY_CARE_PROVIDER_SITE_OTHER): Payer: Medicaid Other | Admitting: Family Medicine

## 2017-09-15 DIAGNOSIS — L658 Other specified nonscarring hair loss: Secondary | ICD-10-CM | POA: Diagnosis not present

## 2017-09-15 NOTE — Progress Notes (Signed)
Subjective  Chelsea Branch is a 52 y.o. female is presenting with the following  HAIR LOSS Losing hair on top and sides of her scalp for years.  Did have itchy scalp several years ago but this went away with nizoral.  Has not tried minoxidil.  No thyroid problems, no sores, no recent severe illness.  Has iud other wise no hormonal therapy  Chief Complaint noted Review of Symptoms - see HPI PMH - Smoking status noted.    Objective Vital Signs reviewed BP 98/63   Pulse 82   Temp 98.3 F (36.8 C) (Oral)   Ht 5\' 8"  (1.727 m)   Wt 209 lb 9.6 oz (95.1 kg)   SpO2 98%   BMI 31.87 kg/m    Scalp is normal without lesions, redness or scaling.  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  Hair on occipital area is thick and normal   Assessments/Plans  See after visit summary for details of patient instuctions  Female pattern hair loss No signs of skin disease.  Very likely age related.  Gave Good Rx coupon for 5% minoxidil to try but warned is not likely to make a big difference

## 2017-09-15 NOTE — Patient Instructions (Signed)
Good to see you today!  Thanks for coming in.  You have normal female pattern hair loss  Minoxidil may help.  Use one cc (1/5 tsp) twice a day and rub into all the bald areas of your scalp twice a day

## 2017-09-15 NOTE — Assessment & Plan Note (Signed)
No signs of skin disease.  Very likely age related.  Gave Good Rx coupon for 5% minoxidil to try but warned is not likely to make a big difference

## 2017-09-23 ENCOUNTER — Emergency Department (HOSPITAL_COMMUNITY): Payer: Medicaid Other

## 2017-09-23 ENCOUNTER — Other Ambulatory Visit: Payer: Self-pay

## 2017-09-23 ENCOUNTER — Emergency Department (HOSPITAL_COMMUNITY)
Admission: EM | Admit: 2017-09-23 | Discharge: 2017-09-23 | Disposition: A | Discharge status: Home / Self Care | Payer: Medicaid Other | Attending: Emergency Medicine | Admitting: Emergency Medicine

## 2017-09-23 ENCOUNTER — Encounter (HOSPITAL_COMMUNITY): Payer: Self-pay

## 2017-09-23 DIAGNOSIS — M545 Low back pain: Secondary | ICD-10-CM | POA: Insufficient documentation

## 2017-09-23 DIAGNOSIS — I5043 Acute on chronic combined systolic (congestive) and diastolic (congestive) heart failure: Secondary | ICD-10-CM | POA: Insufficient documentation

## 2017-09-23 DIAGNOSIS — Z79899 Other long term (current) drug therapy: Secondary | ICD-10-CM | POA: Insufficient documentation

## 2017-09-23 DIAGNOSIS — I110 Hypertensive heart disease with heart failure: Secondary | ICD-10-CM | POA: Diagnosis not present

## 2017-09-23 DIAGNOSIS — Z87891 Personal history of nicotine dependence: Secondary | ICD-10-CM | POA: Diagnosis not present

## 2017-09-23 DIAGNOSIS — R1031 Right lower quadrant pain: Secondary | ICD-10-CM | POA: Diagnosis present

## 2017-09-23 DIAGNOSIS — I252 Old myocardial infarction: Secondary | ICD-10-CM | POA: Insufficient documentation

## 2017-09-23 DIAGNOSIS — Z7982 Long term (current) use of aspirin: Secondary | ICD-10-CM | POA: Insufficient documentation

## 2017-09-23 DIAGNOSIS — G8929 Other chronic pain: Secondary | ICD-10-CM

## 2017-09-23 LAB — URINALYSIS, ROUTINE W REFLEX MICROSCOPIC
Bilirubin Urine: NEGATIVE
GLUCOSE, UA: NEGATIVE mg/dL
Ketones, ur: NEGATIVE mg/dL
Leukocytes, UA: NEGATIVE
Nitrite: NEGATIVE
PROTEIN: NEGATIVE mg/dL
SPECIFIC GRAVITY, URINE: 1.01 (ref 1.005–1.030)
pH: 5 (ref 5.0–8.0)

## 2017-09-23 MED ORDER — MELOXICAM 15 MG PO TABS: 15.0000 mg | ORAL_TABLET | Freq: Every day | ORAL | 0 refills | Status: DC

## 2017-09-23 MED ORDER — METHYLPREDNISOLONE 4 MG PO TBPK: ORAL_TABLET | ORAL | 0 refills | Status: DC

## 2017-09-23 NOTE — ED Provider Notes (Signed)
[facility] DEPART[facility] Provider Note   CSN: [id number] Arrival date & time: 09/23/17  1543     History   Chief Complaint Chief Complaint  Patient presents with  . Back Pain    HPI Chelsea Branch is a 52 y.o. female who presents the emergency department with chief complaint of right low back pain.  Patient states that she fell about a year ago slipped down the stairs on her bottom.  She is having chronic right lower back pain that radiates into the right gluteal region.  She states that it is worse when she is been standing for long period of time.  She took Tylenol and Flexeril which seemed to help somewhat.  She denies any radiation down the legs, weakness, numbness, saddle anesthesia, loss of bowel or bladder control.  She denies any urinary symptoms.  HPI  Past Medical History:  Diagnosis Date  . Abnormal uterine bleeding   . Alopecia   . Anemia   . Angina pectoris with normal coronary arteriogram ([place]) 2017   Had + Troponin c/w ? NSTEMI due to A on C CHF  . 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 Chronic combined systolic and diastolic CHF (congestive heart failure) ([place] [place])   . DYSLIPIDEMIA   . Fibroids   . H/O noncompliance with medical treatment, presenting hazards to health   . Hypertension   . Menorrhagia   . 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 iniatially ?2/2 peripartum in 1994 - improved by 2008 then worsening EF in 2011 back down to EF 25-30%. b. echo 01/21/14 showed mod LVH, [name]; [name], [name], [name],   . NSTEMI (non-ST elevated myocardial infarction) ([place]) 05/2015   Normal Coronaries.  . Peripartum cardiomyopathy 1994  . Sleep apnea 2015   CPAP 12/2013  . Stroke [facility]) 2011  . Tobacco abuse     Patient Active Problem List   Diagnosis Date Noted  . Abnormal thyroid blood test 03/15/2017  . Hypokalemia 07/23/2016  . Low TSH level 04/26/2016  . Abnormal uterine bleeding 04/06/2016  .  Nonspecific chest pain   . 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 NSTEMI (non-ST elevated myocardial infarction) ([place]) - with normal cornaries on Cath 06/14/2015  . Systolic and diastolic CHF, acute on chronic ([place]) 06/14/2015  . H/O noncompliance with medical treatment, presenting hazards to health   . Low back pain 09/16/2014  . Homelessness 09/16/2014  . Vitamin D deficiency 01/29/2014  . OSA (obstructive sleep apnea) 01/08/2014  . Chronic combined systolic and diastolic CHF (congestive heart failure) ([place]) 08/14/2013  . Essential hypertension   . Fibroids 08/09/2012  . Menorrhagia 05/08/2012  . Female pattern hair loss   . HLD (hyperlipidemia) 06/08/2010  . CEREBRAL ANEURYSM 06/08/2010  . 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 CEREBROVASCULAR ACCIDENT, HX OF 06/08/2010    Past Surgical History:  Procedure Laterality Date  . CARDIAC CATHETERIZATION N/A 06/16/2015   Procedure: Left Heart Cath and Coronary Angiography;  Surgeon: Jettie Booze, MD;  Location: [facility];  Service: Cardiovascular;  Laterality: N/A;  . [place]  . LOOP RECORDER IMPLANT  ~ 2000  . TIBIAL TUBERCLERPLASTY    . TUBAL LIGATION  1994     OB History    Gravida  2   Para  2   Term  2   Preterm      AB      Living  3     SAB      TAB      Ectopic      Multiple  1   Live Births  3            Home Medications    Prior to Admission medications   Medication Sig Start Date End Date Taking? Authorizing Provider  aspirin 81 MG EC tablet Take 1 tablet (81 mg total) by mouth daily. 03/31/16   Milagros Loll, MD  Biotin 10000 MCG TABS Take 1 tablet by mouth daily. Patient not taking: Reported on 06/28/2017 04/26/16   Boykin Nearing, MD  cyclobenzaprine (FLEXERIL) 10 MG tablet Take 1 tablet (10 mg total) by mouth 2 (two) times daily as needed for muscle spasms. 06/28/17   Charlott Rakes, MD  diphenhydrAMINE (BENADRYL) 25 mg capsule Take  1 capsule (25 mg total) every 6 (six) hours as needed by mouth for itching or allergies. Patient not taking: Reported on 06/28/2017 04/10/17   Langston Masker B, PA-C  famotidine (PEPCID) 20 MG tablet Take 1 tablet (20 mg total) 2 (two) times daily by mouth. Patient not taking: Reported on 06/28/2017 04/10/17   Langston Masker B, PA-C  furosemide (LASIX) 40 MG tablet Take 1.5 tablet (60 mg)by mouth in the AM and take an additional 1 tablet (40mg ) by mouth in the PM if necessary 06/23/17   Baldwin Jamaica, PA-C  ibuprofen (ADVIL,MOTRIN) 600 MG tablet Take 1 tablet (600 mg total) by mouth every 6 (six) hours as needed. 02/07/17   Woodroe Mode, MD  ketoconazole (NIZORAL) 2 % shampoo Apply 1 application topically 2 (two) times a week. 06/30/17   Charlott Rakes, MD  metoprolol succinate (TOPROL-XL) 50 MG 24 hr tablet Take 1 tablet (50 mg total) by mouth daily. 06/23/17   Baldwin Jamaica, PA-C  potassium chloride SA (KLOR-CON M20) 20 MEQ tablet Take 1 tablet (20 mEq total) by mouth 2 (two) times daily. 06/23/17   Baldwin Jamaica, PA-C  predniSONE (DELTASONE) 10 MG tablet Take 2 tablets (20 mg total) daily by mouth. Take 4 tablets (40 mg) tomorrow and the next day, then 2 tablets (20 mg) the next day, then one tab (10 mg) the last. We gave you your first dose in the emergency department today. Patient not taking: Reported on 06/28/2017 04/10/17   Langston Masker B, PA-C  sacubitril-valsartan (ENTRESTO) 97-103 MG Take 1 tablet by mouth 2 (two) times daily. 06/23/17   Baldwin Jamaica, PA-C  simvastatin (ZOCOR) 10 MG tablet Take 1 tablet (10 mg total) by mouth every evening. 06/23/17   Baldwin Jamaica, PA-C  spironolactone (ALDACTONE) 25 MG tablet Take 0.5 tablets (12.5 mg total) by mouth daily. 06/23/17   Baldwin Jamaica, PA-C    Family History Family History  Problem Relation Age of Onset  . Cancer Maternal Grandmother        uterine  . Hypertension Sister   . Other Neg Hx   . Heart disease Neg Hx      Social History Social History   Tobacco Use  . Smoking status: Former Smoker    Packs/day: 0.10    Years: 30.00    Pack years: 3.00    Types: Cigarettes    Last attempt to quit: 06/03/2015    Years since quitting: 2.3  . Smokeless tobacco: Never Used  Substance Use Topics  . Alcohol use: No    Alcohol/week: 0.0 oz  . Drug use: No  Allergies   Ace inhibitors   Review of Systems Review of Systems Ten systems reviewed and are negative for acute change, except as noted in the HPI.    Physical Exam Updated Vital Signs BP 113/77 (BP Location: Right Arm)   Pulse 75   Temp 97.8 F (36.6 C) (Oral)   Resp 16   Ht 5\' 8"  (1.727 m)   Wt 94.8 kg (209 lb)   SpO2 98%   BMI 31.78 kg/m   Physical Exam  Constitutional: She is oriented to person, place, and time. She appears well-developed and well-nourished. No distress.  HENT:  Head: Normocephalic and atraumatic.  Eyes: Conjunctivae are normal. No scleral icterus.  Neck: Normal range of motion.  Cardiovascular: Normal rate, regular rhythm and normal heart sounds. Exam reveals no gallop and no friction rub.  No murmur heard. Pulmonary/Chest: Effort normal and breath sounds normal. No respiratory distress.  Abdominal: Soft. Bowel sounds are normal. She exhibits no distension and no mass. There is no tenderness. There is no guarding.  Musculoskeletal:  Tender to palpation in the right quadratus lumborum region.  Normal strength and deep tendon reflexes along with normal sensation and pulses in the lower extremities.  Patient of the quadratus lumborum reproduces the patient's pain.   Neurological: She is alert and oriented to person, place, and time.  Skin: Skin is warm and dry. She is not diaphoretic.  Psychiatric: Her behavior is normal.  Nursing note and vitals reviewed.    ED Treatments / Results  Labs (all labs ordered are listed, but only abnormal results are displayed) Labs Reviewed  URINALYSIS, ROUTINE W  REFLEX MICROSCOPIC    EKG None  Radiology No results found.  Procedures Procedures (including critical care time)  Medications Ordered in ED Medications - No data to display   Initial Impression / Assessment and Plan / ED Course  I have reviewed the triage vital signs and the nursing notes.  Pertinent labs & imaging results that were available during my care of the patient were reviewed by me and considered in my medical decision making (see chart for details).     Patient with degenerative changes of the lumbar spine.UA negative for infection Patient with back pain.  No neurological deficits and normal neuro exam.  Patient can walk but states is painful.  No loss of bowel or bladder control.  No concern for cauda equina.  No fever, night sweats, weight loss, h/o cancer, IVDU.  RICE protocol and pain medicine indicated and discussed with patient.    Final Clinical Impressions(s) / ED Diagnoses   Final diagnoses:  Chronic right-sided low back pain without sciatica    ED Discharge Orders    None       Margarita Mail, PA-C 09/23/17 2006    Virgel Manifold, MD 09/27/17 1043

## 2017-09-23 NOTE — ED Notes (Signed)
The pt is c/o lower back and rt hip pain for one year

## 2017-09-23 NOTE — ED Triage Notes (Signed)
Pt endorses lumbar pain since having a fall off of a porch x 1 year ago. [facility] to [facility] and given muscle relaxers without relief. Pt ambulatory, no numbness or tingling. VSS

## 2017-09-23 NOTE — Discharge Instructions (Addendum)
SEEK IMMEDIATE MEDICAL ATTENTION IF: New numbness, tingling, weakness, or problem with the use of your arms or legs.  Severe back pain not relieved with medications.  Change in bowel or bladder control.  Increasing pain in any areas of the body (such as chest or abdominal pain).  Shortness of breath, dizziness or fainting.  Nausea (feeling sick to your stomach), vomiting, fever, or sweats.  

## 2017-09-27 ENCOUNTER — Other Ambulatory Visit: Payer: Self-pay | Admitting: Family Medicine

## 2017-09-27 DIAGNOSIS — M545 Low back pain, unspecified: Secondary | ICD-10-CM

## 2017-09-27 DIAGNOSIS — G8929 Other chronic pain: Secondary | ICD-10-CM

## 2017-10-20 ENCOUNTER — Telehealth: Payer: Self-pay | Admitting: Internal Medicine

## 2017-10-20 NOTE — Telephone Encounter (Signed)
Spoke with patient and made her aware that she already has refills at the pharmacy as below. She will follow up with the pharmacy on this. Outpatient Medication Detail    Disp Refills Start End   spironolactone (ALDACTONE) 25 MG tablet 30 tablet 11 06/23/2017    Sig - Route: Take 0.5 tablets (12.5 mg total) by mouth daily. - Oral   Sent to pharmacy as: spironolactone (ALDACTONE) 25 MG tablet   E-Prescribing Status: Receipt confirmed by pharmacy (06/23/2017 3:58 PM EST)   Pharmacy   [id number] - [place], [place] - [address]

## 2017-10-20 NOTE — Telephone Encounter (Signed)
°*  STAT* If patient is at the pharmacy, call can be transferred to refill team.   1. Which medications need to be refilled? (please list name of each medication and dose if known) Spironolactone  2. Which pharmacy/location (including street and city if local pharmacy) is medication to be sent to? [phone]  3. Do they need a 30 day or 90 day supply? 30 and refills

## 2017-10-25 ENCOUNTER — Ambulatory Visit: Payer: Medicaid Other | Admitting: Family Medicine

## 2017-12-15 NOTE — Progress Notes (Signed)
Cardiology Office Note Date:  12/16/2017  Patient ID:  Chelsea Branch, Chelsea Branch 1965-11-18, MRN [id number] PCP:  Charlott Rakes, MD  Cardiologist:  Dr. Caryl Comes    Chief Complaint: planned f/u  History of Present Illness: Chelsea Branch is a 52 y.o. female with history of NICM, chronic CHF (combined), HTN, HLD, NSTEMI with no obst CAD by cath in 2017, OSA w/CPAP.  She comes today to be seen for Dr. Caryl Comes, last seen by him in Oct 2018, at that time doing well, noted significant dietary indiscretions, particularly a weakness for [employer].  She was planned for an echo to revisit possible need for ICD.   Her echo noted LVEF 40-45%.  I saw her in Jan this year, she reported she was doing OK, generally tired, not sleeping well, waking a few times a night, tosses/turns.  She reports she moved quite a while ago, misplaced her CPAP machine, also wakes at least one the time to use the BR.  Not waking up SOB.  No CP, palpitations.  She was working 6 days a week, caring for her home, doing her shopping and ADLs without difficulty or exertional intolerances.  No dizziness, near syncope or syncope.  She had been out of her [name] for a few weeks but was tolerating well.    She mentioned feeling bloated like she may be retaining fluid.  We discussed daily weights, she wass not monitoring them, discussed diet/low sodium, and reported having had a meatball sub for lunch the day prior.  We spent a lot of time discussing/educating on the importance of daily AM weights and minimizing her sodium intake.  Instructed her on a regime of extra 1/2 tab of lasix for weight gain of 3lb in a day or 5 in a week, and if she finds need for this routinely to make [place] aware for labs/K+ needs, and re-evaluation of management.  I suspected her diet is a significant factor.  Labs noted marked hypokalemia, was eventually corrected though phone communication was challenging.  She is doing fairly well, continues to feel like she battles  fluid retention.  No CP, palpitations, no dizziness near syncope or syncope.  She has not rest SOB, continues to work 6 days a week at [employer] on her feet and at night he back and knees ache.  No SOB at work or when up and around, does not do any dedicated exercise, though has no trouble with her ADLs.  She will occassionally feel congested at night, though no symptoms of PND.  She tells me she is doing very good with her diet, low salt, boiling her chicken, though as we discuss foods to avoid she seems surprised that they are foods to be avoided (canned foods, deli meats, and so on).  She drinks more soda then water and was counseled.     Past Medical History:  Diagnosis Date  . Abnormal thyroid blood test 03/15/2017  . Abnormal uterine bleeding   . Alopecia   . Anemia   . Angina pectoris with normal coronary arteriogram ([place]) 2017   Had + Troponin c/w ? NSTEMI due to A on C CHF  . 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 Chronic combined systolic and diastolic CHF (congestive heart failure) ([place])   . DYSLIPIDEMIA   . Essential hypertension   . Fibroids   . H/O noncompliance with medical treatment, presenting hazards to health   . Hypertension   . Hypokalemia 07/23/2016  . Menorrhagia   . Nonischemic cardiomyopathy ([place]) 1994;  2017   a. iniatially ?2/2 peripartum in 1994 - improved by 2008 then worsening EF in 2011 back down to EF 25-30%. b. echo 01/21/14 showed mod LVH, [name]; [name], [name], [name],   . Nonischemic dilated cardiomyopathy ([place]) 06/17/2015  . NSTEMI (non-ST elevated myocardial infarction) ([place]) 05/2015   Normal Coronaries.  . Peripartum cardiomyopathy 1994  . Sleep apnea 2015   CPAP 12/2013  . Stroke [facility]) 2011  . Systolic and diastolic CHF, acute on chronic ([place]) 06/14/2015  . Tobacco abuse     Past Surgical History:  Procedure Laterality Date  . CARDIAC CATHETERIZATION N/A 06/16/2015   Procedure: Left Heart Cath and Coronary  Angiography;  Surgeon: Jettie Booze, MD;  Location: [facility];  Service: Cardiovascular;  Laterality: N/A;  . [place]  . LOOP RECORDER IMPLANT  ~ 2000  . TIBIAL TUBERCLERPLASTY    . TUBAL LIGATION  1994    Current Outpatient Medications  Medication Sig Dispense Refill  . aspirin 81 MG EC tablet Take 1 tablet (81 mg total) by mouth daily. 30 tablet 0  . Biotin 10000 MCG TABS Take 1 tablet by mouth daily. 30 tablet 11  . cyclobenzaprine (FLEXERIL) 10 MG tablet Take 1 tablet (10 mg total) by mouth 2 (two) times daily as needed for muscle spasms. 60 tablet 1  . diphenhydrAMINE (BENADRYL) 25 mg capsule Take 1 capsule (25 mg total) every 6 (six) hours as needed by mouth for itching or allergies. 12 capsule 0  . famotidine (PEPCID) 20 MG tablet Take 1 tablet (20 mg total) 2 (two) times daily by mouth. 6 tablet 0  . furosemide (LASIX) 40 MG tablet Take 1.5 tablet (60 mg)by mouth in the AM and take an additional 1 tablet (40mg ) by mouth in the PM if necessary 75 tablet 11  . ibuprofen (ADVIL,MOTRIN) 600 MG tablet Take 1 tablet (600 mg total) by mouth every 6 (six) hours as needed. 30 tablet 1  . ketoconazole (NIZORAL) 2 % shampoo Apply 1 application topically 2 (two) times a week. 120 mL 1  . meloxicam (MOBIC) 15 MG tablet Take 1 tablet (15 mg total) by mouth daily. 12 tablet 0  . methylPREDNISolone (MEDROL DOSEPAK) 4 MG TBPK tablet Use as directed 21 tablet 0  . metoprolol succinate (TOPROL-XL) 50 MG 24 hr tablet Take 1 tablet (50 mg total) by mouth daily. 30 tablet 11  . potassium chloride SA (KLOR-CON M20) 20 MEQ tablet Take 1 tablet (20 mEq total) by mouth 2 (two) times daily. 60 tablet 11  . predniSONE (DELTASONE) 10 MG tablet Take 2 tablets (20 mg total) daily by mouth. Take 4 tablets (40 mg) tomorrow and the next day, then 2 tablets (20 mg) the next day, then one tab (10 mg) the last. We gave you your first dose in the emergency department today. 11 tablet 0    . sacubitril-valsartan (ENTRESTO) 97-103 MG Take 1 tablet by mouth 2 (two) times daily. 60 tablet 11  . simvastatin (ZOCOR) 10 MG tablet Take 1 tablet (10 mg total) by mouth every evening. 30 tablet 11  . spironolactone (ALDACTONE) 25 MG tablet Take 0.5 tablets (12.5 mg total) by mouth daily. 30 tablet 11   No current facility-administered medications for this visit.     Allergies:   Ace inhibitors   Social History:  The patient  reports that she quit smoking about 2 years ago. Her smoking  use included cigarettes. She has a 3.00 pack-year smoking history. She has never used smokeless tobacco. She reports that she does not drink alcohol or use drugs.   Family History:  The patient's family history includes Cancer in her maternal grandmother; Hypertension in her sister.  ROS:  Please see the history of present illness.  All other systems are reviewed and otherwise negative.   PHYSICAL EXAM:  VS:  BP 116/70   Pulse (!) 113   Ht 5\' 8"  (1.727 m)   Wt 211 lb (95.7 kg)   SpO2 97%   BMI 32.08 kg/m  BMI: Body mass index is 32.08 kg/m. Well nourished, well developed, in no acute distress  HEENT: normocephalic, atraumatic  Neck: no JVD, carotid bruits or masses Cardiac:  RRR; no significant murmurs, no rubs, or gallops Lungs:  CTA b/l, no wheezing, rhonchi or rales  Abd: soft, non-tender, obese MS: no deformity or atrophy Ext: no edema is appreciated Skin: warm and dry, no rash Neuro:  No gross deficits appreciated Psych: euthymic mood, full affect   EKG:  ST 101bpm, appears similar to previous  03/29/17: TTE Study Conclusions - Left ventricle: The cavity size was normal. There was mild   concentric hypertrophy. Systolic function was mildly to   moderately reduced. The estimated ejection fraction was in the   range of 40% to 45%. Diffuse hypokinesis. Doppler parameters are   consistent with abnormal left ventricular relaxation (grade 1   diastolic dysfunction). - Left atrium:  The atrium was mildly dilated.  06/15/16: TTE: LVEF 30-35% 06/15/15: TTE: LVEF 25-30% 01/21/14: TTE: LVEF 50-55% 07/31/13: TTE: LVEF 25-30%  06/16/15:L LHC  There is severe left ventricular systolic dysfunction.  No significant CAD.  LVEDP elevated.   Recent Labs: 03/15/2017: TSH 0.453 07/08/2017: BUN 8; Creatinine, Ser 0.71; Potassium 3.5; Sodium 143  No results found for requested labs within last 8760 hours.   CrCl cannot be calculated (Patient's most recent lab result is older than the maximum 21 days allowed.).   Wt Readings from Last 3 Encounters:  12/16/17 211 lb (95.7 kg)  09/23/17 209 lb (94.8 kg)  09/15/17 209 lb 9.6 oz (95.1 kg)     Other studies reviewed: Additional studies/records reviewed today include: summarized above  ASSESSMENT AND PLAN:  1. NICM     LVEF most recently up to 40-45%     on BB, Entresto, lasix/K+, aldactone  2. Chronic CHF     I do not appreciated any exam findings to suggest overt fluid OL, though again she feels like she is bloated (?)     She is using an extra lasix (with an extra K+ tab) about every 3 days  Lengthy discussion on exercise to tolerance, weight reduction, as well as fluid and sodium/dietary recommendations.  She is taking K+ 92meq BID only, will take an extra when she takes an extra lasix.  She has had low K+ in the pst will check BMET today  Her weight is not much different then last visit.   3. HTN     Looks OK, no changes  4. HLD     Labs are followed with her PMD per the patient     Not addressed today  5. OSA     Currently untreated, lost her machine in a move     She has been urged to revisit with her PMD and get her CPAP replaced.   Disposition:  WE will see her back in 3 months, pending her labs  will give her a trial of increased diuresis, perhaps her aldactone.  Current medicines are reviewed at length with the patient today.  The patient did not have any concerns regarding medicines.  Venetia Night, PA-C 12/16/2017 2:33 PM     [place] [address] [place] [id number] [phone] (office)  [phone] (fax)

## 2017-12-16 ENCOUNTER — Ambulatory Visit (INDEPENDENT_AMBULATORY_CARE_PROVIDER_SITE_OTHER): Payer: Medicaid Other | Admitting: Physician Assistant

## 2017-12-16 ENCOUNTER — Encounter: Payer: Self-pay | Admitting: Physician Assistant

## 2017-12-16 VITALS — BP 116/70 | HR 113 | Ht 68.0 in | Wt 211.0 lb

## 2017-12-16 DIAGNOSIS — E785 Hyperlipidemia, unspecified: Secondary | ICD-10-CM

## 2017-12-16 DIAGNOSIS — Z79899 Other long term (current) drug therapy: Secondary | ICD-10-CM | POA: Diagnosis not present

## 2017-12-16 DIAGNOSIS — I5022 Chronic systolic (congestive) heart failure: Secondary | ICD-10-CM | POA: Diagnosis not present

## 2017-12-16 DIAGNOSIS — I428 Other cardiomyopathies: Secondary | ICD-10-CM

## 2017-12-16 DIAGNOSIS — I10 Essential (primary) hypertension: Secondary | ICD-10-CM | POA: Diagnosis not present

## 2017-12-16 NOTE — Patient Instructions (Addendum)
Medication Instructions:  Your physician recommends that you continue on your current medications as directed. Please refer to the Current Medication list given to you today.   Labwork: TODAY:  STAT BMET  Testing/Procedures: None ordered  Follow-Up: Your physician wants you to follow-up in: [place], PA-C   You will receive a reminder letter in the mail two months in advance. If you don't receive a letter, please call our office to schedule the follow-up appointment.   Any Other Special Instructions Will Be Listed Below (If Applicable).    If you need a refill on your cardiac medications before your next appointment, please call your pharmacy.

## 2017-12-17 ENCOUNTER — Telehealth: Payer: Self-pay | Admitting: Physician Assistant

## 2017-12-17 LAB — BASIC METABOLIC PANEL
BUN/Creatinine Ratio: 8 — ABNORMAL LOW (ref 9–23)
BUN: 6 mg/dL (ref 6–24)
CO2: 24 mmol/L (ref 20–29)
CREATININE: 0.79 mg/dL (ref 0.57–1.00)
Calcium: 9.2 mg/dL (ref 8.7–10.2)
Chloride: 101 mmol/L (ref 96–106)
GFR calc Af Amer: 100 mL/min/{1.73_m2} (ref >59)
GFR, EST NON AFRICAN AMERICAN: 86 mL/min/{1.73_m2} (ref >59)
GLUCOSE: 109 mg/dL — AB (ref 65–99)
Potassium: 3 mmol/L — ABNORMAL LOW (ref 3.5–5.2)
SODIUM: 143 mmol/L (ref 134–144)

## 2017-12-17 NOTE — Telephone Encounter (Signed)
I was able to get in touch with Gerald Stabs, he confirmed his Mom's number as correct, and that she was at work.  He will get in touch with her to let her know to give a call regarding her lab results.  Also mentioned best time to reach her would be after 4:30.  Tommye Standard, PA-C

## 2017-12-17 NOTE — Telephone Encounter (Signed)
Called both listed numbers for the patient to discuss lab result.    Discussed yesterday with the patient our previous difficulties in getting in touch with her, I had mentioned to the patient given history of low potassium, it would be important to be able to reach her if we needed to make adjustements in her medicines.  She confirmed the numbers listed and also her son's number and said we could also reach her through him.  I left messages at both the patients listed numbers to call regarding her lab results and needed adjustements in her medicine, I was unable to leave a message at [place] (son) number.    I left our main number to call back, in regards to her lab results.   I will try again later today as well.  Tommye Standard, PA-C

## 2017-12-20 ENCOUNTER — Other Ambulatory Visit: Payer: Medicaid Other

## 2017-12-20 ENCOUNTER — Other Ambulatory Visit: Payer: Self-pay | Admitting: *Deleted

## 2017-12-20 DIAGNOSIS — Z79899 Other long term (current) drug therapy: Secondary | ICD-10-CM

## 2017-12-20 MED ORDER — POTASSIUM CHLORIDE CRYS ER 20 MEQ PO TBCR: 40.0000 meq | EXTENDED_RELEASE_TABLET | Freq: Two times a day (BID) | ORAL | 3 refills | Status: DC

## 2017-12-21 ENCOUNTER — Other Ambulatory Visit: Payer: Medicaid Other

## 2017-12-22 ENCOUNTER — Other Ambulatory Visit: Payer: Medicaid Other | Admitting: *Deleted

## 2017-12-22 ENCOUNTER — Telehealth: Payer: Self-pay | Admitting: Physician Assistant

## 2017-12-22 ENCOUNTER — Other Ambulatory Visit: Payer: Self-pay | Admitting: Physician Assistant

## 2017-12-22 NOTE — Telephone Encounter (Signed)
Spoke with the patient, she has been working long hours, confirms she has been taking the doubled dose of her potassium.  She will come in for her lab today after work.  We will follow up pending that result.  Tommye Standard, PA-C

## 2017-12-26 ENCOUNTER — Telehealth: Payer: Self-pay | Admitting: *Deleted

## 2017-12-26 NOTE — Telephone Encounter (Signed)
SPOKE WITH PT THIS AM WHO STATES SHE WILL COME TODAY AFTER WORK BEFORE LAB CLOSES.  PT WAS EXPLAINED THE IMPORTANCE OF LABS TO BE DONE AND PT WAS ASKED TO GIVE CLINIC A CALL BACK AND ASKED TO SPEAK TO Chelsea Branch IF [facility] IS UNABLE TO MAKE IT.

## 2017-12-26 NOTE — Telephone Encounter (Signed)
-----   Message from [facility], [place] sent at 12/26/2017  6:49 AM EDT ----- Patient missed lab appointment last week after we spoke, she was going to come in after work.  Please call her/her son, she needs to get K+ checked today.   Renee

## 2017-12-30 ENCOUNTER — Telehealth: Payer: Self-pay | Admitting: *Deleted

## 2017-12-30 NOTE — Telephone Encounter (Signed)
LMOVM TO CONTACT CLINIC BACK FOR TO SEE  FURTHER STEPS TO GET LABS

## 2017-12-30 NOTE — Telephone Encounter (Signed)
-----   Message from [facility], [place] sent at 12/26/2017  6:49 AM EDT ----- Patient missed lab appointment last week after we spoke, she was going to come in after work.  Please call her/her son, she needs to get K+ checked today.   Renee

## 2018-01-06 ENCOUNTER — Other Ambulatory Visit: Payer: Medicaid Other

## 2018-01-13 ENCOUNTER — Telehealth: Payer: Self-pay | Admitting: *Deleted

## 2018-01-13 ENCOUNTER — Telehealth: Payer: Self-pay | Admitting: Physician Assistant

## 2018-01-13 NOTE — Telephone Encounter (Signed)
Called the patient to f/u on her lab not yet done.  She answered the phone, sounded like she was at work, though after we said hello to each other got disconnected (at the [phone]), I called back though had to leave a message.  Asking her to call today to make an appointment to get her lab done today, expressed again the importance of this. Tried the [phone] #, her son answered said she was at work and forgot that phone in the car, would ask her to call the office.  Earlier this week I reached out to Production assistant, radio in our office to aid/for guidance on our policy/how to proceed, she asked me to staff message (and did) her the information, I am not sure if she has yet been able to reach out to the patient.  I will reach out again.   Tommye Standard, PA-C

## 2018-01-13 NOTE — Telephone Encounter (Signed)
Left message for the patient to call me back in regards to the lab appointment she still needs to have and her medications in regards to the labs.  I left that it was of urgent nature to return my call today.

## 2018-02-12 ENCOUNTER — Encounter (HOSPITAL_COMMUNITY): Payer: Self-pay | Admitting: Emergency Medicine

## 2018-02-12 ENCOUNTER — Emergency Department (HOSPITAL_COMMUNITY)
Admission: EM | Admit: 2018-02-12 | Discharge: 2018-02-12 | Disposition: A | Discharge status: Home / Self Care | Payer: Medicaid Other | Attending: Emergency Medicine | Admitting: Emergency Medicine

## 2018-02-12 ENCOUNTER — Other Ambulatory Visit: Payer: Self-pay

## 2018-02-12 DIAGNOSIS — Z79899 Other long term (current) drug therapy: Secondary | ICD-10-CM | POA: Diagnosis not present

## 2018-02-12 DIAGNOSIS — Z7982 Long term (current) use of aspirin: Secondary | ICD-10-CM | POA: Diagnosis not present

## 2018-02-12 DIAGNOSIS — I10 Essential (primary) hypertension: Secondary | ICD-10-CM | POA: Diagnosis not present

## 2018-02-12 DIAGNOSIS — I5041 Acute combined systolic (congestive) and diastolic (congestive) heart failure: Secondary | ICD-10-CM | POA: Insufficient documentation

## 2018-02-12 DIAGNOSIS — J40 Bronchitis, not specified as acute or chronic: Secondary | ICD-10-CM | POA: Diagnosis not present

## 2018-02-12 DIAGNOSIS — R05 Cough: Secondary | ICD-10-CM | POA: Diagnosis present

## 2018-02-12 DIAGNOSIS — Z87891 Personal history of nicotine dependence: Secondary | ICD-10-CM | POA: Diagnosis not present

## 2018-02-12 DIAGNOSIS — I110 Hypertensive heart disease with heart failure: Secondary | ICD-10-CM | POA: Diagnosis not present

## 2018-02-12 MED ORDER — ALBUTEROL SULFATE HFA 108 (90 BASE) MCG/ACT IN AERS
1.0000 | INHALATION_SPRAY | Freq: Once | RESPIRATORY_TRACT | Status: AC
  Administered 2018-02-12: 1 via RESPIRATORY_TRACT
  Filled 2018-02-12: qty 6.7

## 2018-02-12 MED ORDER — FLUTICASONE PROPIONATE 50 MCG/ACT NA SUSP: 1.0000 | Freq: Every day | NASAL | 2 refills | Status: DC

## 2018-02-12 MED ORDER — DEXTROMETHORPHAN-GUAIFENESIN 10-200 MG PO CAPS: 1.0000 | ORAL_CAPSULE | ORAL | 0 refills | Status: DC

## 2018-02-12 MED ORDER — BENZONATATE 100 MG PO CAPS
100.0000 mg | ORAL_CAPSULE | Freq: Three times a day (TID) | ORAL | 0 refills | Status: AC | PRN
Start: 2018-02-12 — End: 2018-02-22

## 2018-02-12 NOTE — ED Triage Notes (Signed)
Pt. Stated, Donnald Garre been coughing and coughing cause I have a lot of congestion, started all Monday.

## 2018-02-12 NOTE — ED Provider Notes (Signed)
[facility] EMERGENCY DEPARTMENT Provider Note   CSN: [id number] Arrival date & time: 02/12/18  2409     History   Chief Complaint Chief Complaint  Patient presents with  . Cough  . Nasal Congestion    HPI Chelsea Branch is a 52 y.o. female.  52 year old female presents with complaint of cough x1 week.  Patient reports sinus congestion as well as chest congestion and soreness in her ribs with coughing.  States that she had a fever when her symptoms first started but none since that first day.  Patient is exposed to her significant other and son who have both been coughing.  Patient is a daily smoker, no history of asthma or chronic lung disease.  No other complaints or concerns.     Past Medical History:  Diagnosis Date  . Abnormal thyroid blood test 03/15/2017  . Abnormal uterine bleeding   . Alopecia   . Anemia   . Angina pectoris with normal coronary arteriogram ([place]) 2017   Had + Troponin c/w ? NSTEMI due to A on C CHF  . 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 Chronic combined systolic and diastolic CHF (congestive heart failure) ([place])   . DYSLIPIDEMIA   . Essential hypertension   . Fibroids   . H/O noncompliance with medical treatment, presenting hazards to health   . Hypertension   . Hypokalemia 07/23/2016  . Menorrhagia   . Nonischemic cardiomyopathy ([place]) 1994; 2017   a. iniatially ?2/2 peripartum in 1994 - improved by 2008 then worsening EF in 2011 back down to EF 25-30%. b. echo 01/21/14 showed mod LVH, [name]; [name], [name], [name],   . Nonischemic dilated cardiomyopathy ([place]) 06/17/2015  . NSTEMI (non-ST elevated myocardial infarction) ([place]) 05/2015   Normal Coronaries.  . Peripartum cardiomyopathy 1994  . Sleep apnea 2015   CPAP 12/2013  . Stroke [facility]) 2011  . Systolic and diastolic CHF, acute on chronic ([place]) 06/14/2015  . Tobacco abuse     Patient Active Problem List   Diagnosis Date Noted  . Abnormal  thyroid blood test 03/15/2017  . Hypokalemia 07/23/2016  . Low TSH level 04/26/2016  . Abnormal uterine bleeding 04/06/2016  . Nonspecific chest pain   . Nonischemic dilated cardiomyopathy ([place]) 06/17/2015  . NSTEMI (non-ST elevated myocardial infarction) ([place]) - with normal cornaries on Cath 06/14/2015  . Systolic and diastolic CHF, acute on chronic ([place]) 06/14/2015  . H/O noncompliance with medical treatment, presenting hazards to health   . Low back pain 09/16/2014  . Homelessness 09/16/2014  . Vitamin D deficiency 01/29/2014  . OSA (obstructive sleep apnea) 01/08/2014  . Chronic combined systolic and diastolic CHF (congestive heart failure) ([place]) 08/14/2013  . Essential hypertension   . Fibroids 08/09/2012  . Menorrhagia 05/08/2012  . Female pattern hair loss   . HLD (hyperlipidemia) 06/08/2010  . CEREBRAL ANEURYSM 06/08/2010  . 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 CEREBROVASCULAR ACCIDENT, HX OF 06/08/2010    Past Surgical History:  Procedure Laterality Date  . CARDIAC CATHETERIZATION N/A 06/16/2015   Procedure: Left Heart Cath and Coronary Angiography;  Surgeon: Jettie Booze, MD;  Location: [facility] CV LAB;  Service: Cardiovascular;  Laterality: N/A;  . [place]  . LOOP RECORDER IMPLANT  ~ 2000  . TIBIAL TUBERCLERPLASTY    . TUBAL LIGATION  1994     OB History    Gravida  2   Para  2  Term  2   Preterm      AB      Living  3     SAB      TAB      Ectopic      Multiple  1   Live Births  3            Home Medications    Prior to Admission medications   Medication Sig Start Date End Date Taking? Authorizing Provider  aspirin 81 MG EC tablet Take 1 tablet (81 mg total) by mouth daily. 03/31/16   Milagros Loll, MD  benzonatate (TESSALON) 100 MG capsule Take 1 capsule (100 mg total) by mouth 3 (three) times daily as needed for up to 10 days for cough. 02/12/18 02/22/18  [name], PA-C  Biotin 10000 MCG  TABS Take 1 tablet by mouth daily. 04/26/16   Funches, Adriana Mccallum, MD  cyclobenzaprine (FLEXERIL) 10 MG tablet Take 1 tablet (10 mg total) by mouth 2 (two) times daily as needed for muscle spasms. 06/28/17   Charlott Rakes, MD  Dextromethorphan-guaiFENesin (CORICIDIN HBP CONGESTION/COUGH) 10-200 MG CAPS Take 1 tablet by mouth as directed. Take as directed on packaging 02/12/18   [name], PA-C  diphenhydrAMINE (BENADRYL) 25 mg capsule Take 1 capsule (25 mg total) every 6 (six) hours as needed by mouth for itching or allergies. 04/10/17   Langston Masker B, PA-C  famotidine (PEPCID) 20 MG tablet Take 1 tablet (20 mg total) 2 (two) times daily by mouth. 04/10/17   Langston Masker B, PA-C  fluticasone (FLONASE) 50 MCG/ACT nasal spray Place 1 spray into both nostrils daily. 02/12/18   [name], PA-C  furosemide (LASIX) 40 MG tablet Take 40 mg by mouth 2 (two) times daily.    [provider]  ibuprofen (ADVIL,MOTRIN) 600 MG tablet Take 1 tablet (600 mg total) by mouth every 6 (six) hours as needed. 02/07/17   Woodroe Mode, MD  ketoconazole (NIZORAL) 2 % shampoo Apply 1 application topically 2 (two) times a week. 06/30/17   Charlott Rakes, MD  meloxicam (MOBIC) 15 MG tablet Take 1 tablet (15 mg total) by mouth daily. 09/23/17   Margarita Mail, PA-C  metoprolol succinate (TOPROL-XL) 50 MG 24 hr tablet Take 1 tablet (50 mg total) by mouth daily. 06/23/17   Baldwin Jamaica, PA-C  potassium chloride SA (KLOR-CON M20) 20 MEQ tablet Take 2 tablets (40 mEq total) by mouth 2 (two) times daily. 12/20/17   Baldwin Jamaica, PA-C  sacubitril-valsartan (ENTRESTO) 97-103 MG Take 1 tablet by mouth 2 (two) times daily. 06/23/17   Baldwin Jamaica, PA-C  simvastatin (ZOCOR) 10 MG tablet Take 1 tablet (10 mg total) by mouth every evening. 06/23/17   Baldwin Jamaica, PA-C  spironolactone (ALDACTONE) 25 MG tablet Take 0.5 tablets (12.5 mg total) by mouth daily. 06/23/17   Baldwin Jamaica, PA-C    Family  History Family History  Problem Relation Age of Onset  . Cancer Maternal Grandmother        uterine  . Hypertension Sister   . Other Neg Hx   . Heart disease Neg Hx     Social History Social History   Tobacco Use  . Smoking status: Former Smoker    Packs/day: 0.10    Years: 30.00    Pack years: 3.00    Types: Cigarettes    Last attempt to quit: 06/03/2015    Years since quitting: 2.6  . Smokeless tobacco:  Never Used  Substance Use Topics  . Alcohol use: No    Alcohol/week: 0.0 standard drinks  . Drug use: No     Allergies   Ace inhibitors   Review of Systems Review of Systems  Constitutional: Negative for chills, diaphoresis and fever.  HENT: Positive for congestion. Negative for ear pain, rhinorrhea, sinus pressure, sinus pain, sneezing, sore throat, trouble swallowing and voice change.   Eyes: Negative for discharge and redness.  Respiratory: Positive for cough. Negative for chest tightness, shortness of breath and wheezing.   Skin: Negative for rash and wound.  Allergic/Immunologic: Negative for immunocompromised state.  Neurological: Negative for headaches.  Psychiatric/Behavioral: Negative for confusion.  All other systems reviewed and are negative.    Physical Exam Updated Vital Signs BP (!) 121/93 (BP Location: Right Arm)   Pulse 89   Temp 98.5 F (36.9 C) (Oral)   Resp 20   Ht 5\' 8"  (1.727 m)   Wt 90.3 kg   SpO2 98%   BMI 30.26 kg/m   Physical Exam  Constitutional: She is oriented to person, place, and time. She appears well-developed and well-nourished. No distress.  HENT:  Head: Normocephalic and atraumatic.  Nose: Mucosal edema present. Right sinus exhibits no maxillary sinus tenderness and no frontal sinus tenderness. Left sinus exhibits no maxillary sinus tenderness and no frontal sinus tenderness.  Mouth/Throat: Mucous membranes are normal. Posterior oropharyngeal edema present. No posterior oropharyngeal erythema or tonsillar abscesses.    Eyes: Conjunctivae are normal.  Neck: Neck supple.  Cardiovascular: Normal rate, regular rhythm, normal heart sounds and intact distal pulses.  No murmur heard. Pulmonary/Chest: Effort normal. She has wheezes in the right lower field and the left lower field.  Lymphadenopathy:    She has no cervical adenopathy.  Neurological: She is alert and oriented to person, place, and time.  Skin: Skin is warm and dry. She is not diaphoretic.  Psychiatric: She has a normal mood and affect. Her behavior is normal.  Nursing note and vitals reviewed.    ED Treatments / Results  Labs (all labs ordered are listed, but only abnormal results are displayed) Labs Reviewed - No data to display  EKG None  Radiology No results found.  Procedures Procedures (including critical care time)  Medications Ordered in ED Medications  albuterol (PROVENTIL HFA;VENTOLIN HFA) 108 (90 Base) MCG/ACT inhaler 1 puff (1 puff Inhalation Given 02/12/18 0947)     Initial Impression / Assessment and Plan / ED Course  I have reviewed the triage vital signs and the nursing notes.  Pertinent labs & imaging results that were available during my care of the patient were reviewed by me and considered in my medical decision making (see chart for details).  Clinical Course as of Feb 13 1016  Sun Feb 12, 1959  962 52 year old female with cough x1 week.  On exam patient has very mild basilar wheezing.  Patient has a history of tobacco use, hypertension, no history of chronic lung disease.  Patient was given an inhaler while in the emergency room, states she has used this before with cough with relief.  Advised to quit smoking, recommend Coricidin HBP, Flonase, saline sinus rinse, Zyrtec.  Tessalon if needed for cough.  Follow-up with PCP.   [LM]    Clinical Course User Index [LM] [name], PA-C    Final Clinical Impressions(s) / ED Diagnoses   Final diagnoses:  Bronchitis    ED Discharge Orders  Ordered    benzonatate (TESSALON) 100 MG capsule  3 times daily PRN     02/12/18 0941    fluticasone (FLONASE) 50 MCG/ACT nasal spray  Daily     02/12/18 0941    Dextromethorphan-guaiFENesin (CORICIDIN HBP CONGESTION/COUGH) 10-200 MG CAPS  As directed     02/12/18 0941           [name], PA-C 02/12/18 1018    Davonna Belling, MD 02/12/18 1606

## 2018-02-12 NOTE — Discharge Instructions (Addendum)
Saline sinus rinse twice daily. Use Flonase daily, take Zyrtec daily. Take Coricidin as needed as directed. Flonase as needed for cough. Use inhaler as needed for cough every 4-6 hours. Recheck with your doctor.  Return to the ER for worsening symptoms.

## 2018-02-15 ENCOUNTER — Ambulatory Visit (HOSPITAL_COMMUNITY)
Admission: EM | Admit: 2018-02-15 | Discharge: 2018-02-15 | Disposition: A | Discharge status: Home / Self Care | Payer: Medicaid Other | Attending: Family Medicine | Admitting: Family Medicine

## 2018-02-15 ENCOUNTER — Ambulatory Visit (INDEPENDENT_AMBULATORY_CARE_PROVIDER_SITE_OTHER): Payer: Medicaid Other

## 2018-02-15 ENCOUNTER — Encounter (HOSPITAL_COMMUNITY): Payer: Self-pay | Admitting: Emergency Medicine

## 2018-02-15 DIAGNOSIS — J441 Chronic obstructive pulmonary disease with (acute) exacerbation: Secondary | ICD-10-CM

## 2018-02-15 DIAGNOSIS — R05 Cough: Secondary | ICD-10-CM | POA: Diagnosis not present

## 2018-02-15 MED ORDER — AZITHROMYCIN 250 MG PO TABS: 250.0000 mg | ORAL_TABLET | Freq: Every day | ORAL | 0 refills | Status: DC

## 2018-02-15 MED ORDER — ALBUTEROL SULFATE HFA 108 (90 BASE) MCG/ACT IN AERS
INHALATION_SPRAY | RESPIRATORY_TRACT | Status: AC
  Filled 2018-02-15: qty 6.7

## 2018-02-15 MED ORDER — ALBUTEROL SULFATE HFA 108 (90 BASE) MCG/ACT IN AERS
2.0000 | INHALATION_SPRAY | Freq: Once | RESPIRATORY_TRACT | Status: AC
  Administered 2018-02-15: 2 via RESPIRATORY_TRACT

## 2018-02-15 MED ORDER — PREDNISONE 20 MG PO TABS: 20.0000 mg | ORAL_TABLET | Freq: Two times a day (BID) | ORAL | 0 refills | Status: AC

## 2018-02-15 MED ORDER — IPRATROPIUM-ALBUTEROL 0.5-2.5 (3) MG/3ML IN SOLN
3.0000 mL | Freq: Once | RESPIRATORY_TRACT | Status: AC
  Administered 2018-02-15: 3 mL via RESPIRATORY_TRACT

## 2018-02-15 MED ORDER — IPRATROPIUM-ALBUTEROL 0.5-2.5 (3) MG/3ML IN SOLN
RESPIRATORY_TRACT | Status: AC
  Filled 2018-02-15: qty 3

## 2018-02-15 NOTE — Discharge Instructions (Addendum)
Chest x-ray showed findings consistent with COPD Dun-neb given in office Albuterol inhaler given in office Prednisone prescribed.  Take as directed and to completion Azithromycin prescribed.  Take as directed and to completion Follow up with PCP if symptoms persists Return or go to the ED if you have any new or worsening symptoms

## 2018-02-15 NOTE — ED Triage Notes (Signed)
Pt c/o ongoing cough, was seen here before for the same thing and given inhaler. States the inhaler hasn't helped at all.

## 2018-02-15 NOTE — ED Provider Notes (Signed)
[facility]   [id number] 02/15/18 Arrival Time: 4128  CC: cough   SUBJECTIVE:  Chelsea Branch is a 52 y.o. female hx significant for tobacco use, who presents with cough for 8-9 days ago.  Admits to positive sick exposure.  Was seen in the ED on 02/12/18 and diagnosed with bronchitis.  Has not taken medication due to financial difficulties.  Describes cough as intermittent and productive with clear sputum.  Has tried nasal spray and inhaler with relief.  Symptoms are made worse with sleeping at night. Complains of chills, fatigue, rhinorrhea, and nasal congestion.  Denies fever, sore throat, SOB, wheezing, chest pain, nausea, changes in bowel or bladder habits.    ROS: As per HPI.  Past Medical History:  Diagnosis Date  . Abnormal thyroid blood test 03/15/2017  . Abnormal uterine bleeding   . Alopecia   . Anemia   . Angina pectoris with normal coronary arteriogram ([place]) 2017   Had + Troponin c/w ? NSTEMI due to A on C CHF  . 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 Chronic combined systolic and diastolic CHF (congestive heart failure) ([place])   . DYSLIPIDEMIA   . Essential hypertension   . Fibroids   . H/O noncompliance with medical treatment, presenting hazards to health   . Hypertension   . Hypokalemia 07/23/2016  . Menorrhagia   . 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 iniatially ?2/2 peripartum in 1994 - improved by 2008 then worsening EF in 2011 back down to EF 25-30%. b. echo 01/21/14 showed mod LVH, [name]; [name], [name], [name],   . 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 NSTEMI (non-ST elevated myocardial infarction) ([place]) 05/2015   Normal Coronaries.  . Peripartum cardiomyopathy 1994  . Sleep apnea 2015   CPAP 12/2013  . Stroke [facility]) 2011  . Systolic and diastolic CHF, acute on chronic ([place]) 06/14/2015  . Tobacco abuse    Past Surgical History:  Procedure Laterality Date  . CARDIAC CATHETERIZATION N/A 06/16/2015    Procedure: Left Heart Cath and Coronary Angiography;  Surgeon: Jettie Booze, MD;  Location: [facility] CV LAB;  Service: Cardiovascular;  Laterality: N/A;  . [place]  . LOOP RECORDER IMPLANT  ~ 2000  . TIBIAL TUBERCLERPLASTY    . TUBAL LIGATION  1994   Allergies  Allergen Reactions  . Ace Inhibitors Other (See Comments)    REACTION: Cough   No current facility-administered medications on file prior to encounter.    Current Outpatient Medications on File Prior to Encounter  Medication Sig Dispense Refill  . aspirin 81 MG EC tablet Take 1 tablet (81 mg total) by mouth daily. 30 tablet 0  . benzonatate (TESSALON) 100 MG capsule Take 1 capsule (100 mg total) by mouth 3 (three) times daily as needed for up to 10 days for cough. 30 capsule 0  . Biotin 10000 MCG TABS Take 1 tablet by mouth daily. 30 tablet 11  . cyclobenzaprine (FLEXERIL) 10 MG tablet Take 1 tablet (10 mg total) by mouth 2 (two) times daily as needed for muscle spasms. 60 tablet 1  . Dextromethorphan-guaiFENesin (CORICIDIN HBP CONGESTION/COUGH) 10-200 MG CAPS Take 1 tablet by mouth as directed. Take as directed on packaging 30 each 0  . diphenhydrAMINE (BENADRYL) 25 mg capsule Take 1 capsule (25 mg total) every 6 (six) hours as needed by mouth for itching or allergies. 12 capsule 0  . famotidine (PEPCID) 20 MG  tablet Take 1 tablet (20 mg total) 2 (two) times daily by mouth. 6 tablet 0  . fluticasone (FLONASE) 50 MCG/ACT nasal spray Place 1 spray into both nostrils daily. 16 g 2  . furosemide (LASIX) 40 MG tablet Take 40 mg by mouth 2 (two) times daily.    Marland Kitchen ibuprofen (ADVIL,MOTRIN) 600 MG tablet Take 1 tablet (600 mg total) by mouth every 6 (six) hours as needed. 30 tablet 1  . ketoconazole (NIZORAL) 2 % shampoo Apply 1 application topically 2 (two) times a week. 120 mL 1  . meloxicam (MOBIC) 15 MG tablet Take 1 tablet (15 mg total) by mouth daily. 12 tablet 0  . metoprolol succinate (TOPROL-XL) 50  MG 24 hr tablet Take 1 tablet (50 mg total) by mouth daily. 30 tablet 11  . potassium chloride SA (KLOR-CON M20) 20 MEQ tablet Take 2 tablets (40 mEq total) by mouth 2 (two) times daily. 60 tablet 3  . sacubitril-valsartan (ENTRESTO) 97-103 MG Take 1 tablet by mouth 2 (two) times daily. 60 tablet 11  . simvastatin (ZOCOR) 10 MG tablet Take 1 tablet (10 mg total) by mouth every evening. 30 tablet 11  . spironolactone (ALDACTONE) 25 MG tablet Take 0.5 tablets (12.5 mg total) by mouth daily. 30 tablet 11    Social History   Socioeconomic History  . Marital status: Single    Spouse name: Not on file  . Number of children: 3  . Years of education: 62  . Highest education level: Not on file  Occupational History  . Occupation: unemployed  Social Needs  . Financial resource strain: Not on file  . Food insecurity:    Worry: Not on file    Inability: Not on file  . Transportation needs:    Medical: Not on file    Non-medical: Not on file  Tobacco Use  . Smoking status: Former Smoker    Packs/day: 0.10    Years: 30.00    Pack years: 3.00    Types: Cigarettes    Last attempt to quit: 06/03/2015    Years since quitting: 2.7  . Smokeless tobacco: Never Used  Substance and Sexual Activity  . Alcohol use: No    Alcohol/week: 0.0 standard drinks  . Drug use: No  . Sexual activity: Yes    Birth control/protection: IUD  Lifestyle  . Physical activity:    Days per week: Not on file    Minutes per session: Not on file  . Stress: Not on file  Relationships  . Social connections:    Talks on phone: Not on file    Gets together: Not on file    Attends religious service: Not on file    Active member of club or organization: Not on file    Attends meetings of clubs or organizations: Not on file    Relationship status: Not on file  . Intimate partner violence:    Fear of current or ex partner: Not on file    Emotionally abused: Not on file    Physically abused: Not on file    Forced  sexual activity: Not on file  Other Topics Concern  . Not on file  Social History Narrative   Lives at home with 53 yo twins ([place] and [place])   24 yo son lives outside the home   64 yo granddaughter    Family History  Problem Relation Age of Onset  . Cancer Maternal Grandmother        uterine  .  Hypertension Sister   . Other Neg Hx   . Heart disease Neg Hx      OBJECTIVE:  Vitals:   02/15/18 1424  BP: 135/83  Pulse: 100  Resp: 18  Temp: 98.3 F (36.8 C)  SpO2: 98%     General appearance: AOx3 in no acute distress; talking in full sentences without difficulty; nontoxic appearance HEENT: PERRL.  EOM grossly intact.  mild clear rhinorrhea; tonsils nonerythematous, uvula midline Neck: supple without LAD Lungs: scattered wheezes heard throughout bilateral lung fields; improved follow duo-neb treatment Heart: regular rate and rhythm.  Radial pulses 2+ symmetrical bilaterally Skin: warm and dry Psychological: alert and cooperative; normal mood and affect  DIAGNOSTIC STUDIES:  Dg Chest 2 View  Result Date: 02/15/2018 CLINICAL DATA:  Productive cough for the past 10 days. Smoker. EXAM: CHEST - 2 VIEW COMPARISON:  03/29/2016. FINDINGS: Normal sized heart. Clear lungs. The lungs remain mildly hyperexpanded with mild peribronchial thickening. Mild thoracic spine degenerative changes and minimal scoliosis. IMPRESSION: Stable mild changes of COPD and chronic bronchitis. No acute cardiopulmonary abnormality. Electronically Signed   By: Claudie Revering M.D.   On: 02/15/2018 15:47    ASSESSMENT & PLAN:  1. COPD exacerbation ([place])     Meds ordered this encounter  Medications  . ipratropium-albuterol (DUONEB) 0.5-2.5 (3) MG/3ML nebulizer solution 3 mL  . predniSONE (DELTASONE) 20 MG tablet    Sig: Take 1 tablet (20 mg total) by mouth 2 (two) times daily with a meal for 5 days.    Dispense:  10 tablet    Refill:  0    Order Specific Question:   Supervising Provider    Answer:    Wynona Luna [phone]  . azithromycin (ZITHROMAX) 250 MG tablet    Sig: Take 1 tablet (250 mg total) by mouth daily. Take first 2 tablets together, then 1 every day until finished.    Dispense:  6 tablet    Refill:  0    Order Specific Question:   Supervising Provider    Answer:   Wynona Luna [phone]  . albuterol (PROVENTIL HFA;VENTOLIN HFA) 108 (90 Base) MCG/ACT inhaler 2 puff   Chest x-ray showed findings consistent with COPD Dun-neb given in office Albuterol inhaler given in office Prednisone prescribed.  Take as directed and to completion Azithromycin prescribed.  Take as directed and to completion Follow up with PCP if symptoms persists Return or go to the ED if you have any new or worsening symptoms  Reviewed expectations re: course of current medical issues. Questions answered. Outlined signs and symptoms indicating need for more acute intervention. Patient verbalized understanding. After Visit Summary given.          Lestine Box, PA-C 02/15/18 1614

## 2018-04-05 ENCOUNTER — Ambulatory Visit: Payer: Medicaid Other | Admitting: Family Medicine

## 2018-05-22 ENCOUNTER — Telehealth: Payer: Self-pay | Admitting: Physician Assistant

## 2018-05-22 NOTE — Telephone Encounter (Signed)
Patient is calling to report CP (6/10) and SOB over the past 4 weeks, but has worsened and last night she was perspiring. I instructed her to go to the ED via EMS for further evaluation.  She verbalized understanding and will comply.

## 2018-05-22 NOTE — Telephone Encounter (Signed)
New Message   Pt c/o of Chest Pain: STAT if CP now or developed within 24 hours  1. Are you having CP right now? yes  2. Are you experiencing any other symptoms (ex. SOB, nausea, vomiting, sweating)? Sweating at night cant sleep  3. How long have you been experiencing CP? About 4 weeks  4. Is your CP continuous or coming and going? Coming and going  5. Have you taken Nitroglycerin? no ?

## 2018-05-24 ENCOUNTER — Encounter (HOSPITAL_COMMUNITY): Payer: Self-pay | Admitting: Emergency Medicine

## 2018-05-24 ENCOUNTER — Emergency Department (HOSPITAL_COMMUNITY)
Admission: EM | Admit: 2018-05-24 | Discharge: 2018-05-24 | Disposition: A | Discharge status: Home / Self Care | Payer: Medicaid Other | Attending: Emergency Medicine | Admitting: Emergency Medicine

## 2018-05-24 ENCOUNTER — Emergency Department (HOSPITAL_COMMUNITY): Payer: Medicaid Other

## 2018-05-24 DIAGNOSIS — H9202 Otalgia, left ear: Secondary | ICD-10-CM | POA: Diagnosis not present

## 2018-05-24 DIAGNOSIS — J40 Bronchitis, not specified as acute or chronic: Secondary | ICD-10-CM | POA: Diagnosis not present

## 2018-05-24 DIAGNOSIS — I110 Hypertensive heart disease with heart failure: Secondary | ICD-10-CM | POA: Insufficient documentation

## 2018-05-24 DIAGNOSIS — Z79899 Other long term (current) drug therapy: Secondary | ICD-10-CM | POA: Diagnosis not present

## 2018-05-24 DIAGNOSIS — J329 Chronic sinusitis, unspecified: Secondary | ICD-10-CM

## 2018-05-24 DIAGNOSIS — I5043 Acute on chronic combined systolic (congestive) and diastolic (congestive) heart failure: Secondary | ICD-10-CM | POA: Insufficient documentation

## 2018-05-24 DIAGNOSIS — R9431 Abnormal electrocardiogram [ECG] [EKG]: Secondary | ICD-10-CM | POA: Diagnosis not present

## 2018-05-24 DIAGNOSIS — J209 Acute bronchitis, unspecified: Secondary | ICD-10-CM | POA: Diagnosis not present

## 2018-05-24 DIAGNOSIS — F17210 Nicotine dependence, cigarettes, uncomplicated: Secondary | ICD-10-CM | POA: Insufficient documentation

## 2018-05-24 DIAGNOSIS — R079 Chest pain, unspecified: Secondary | ICD-10-CM | POA: Diagnosis not present

## 2018-05-24 DIAGNOSIS — Z7982 Long term (current) use of aspirin: Secondary | ICD-10-CM | POA: Insufficient documentation

## 2018-05-24 DIAGNOSIS — J019 Acute sinusitis, unspecified: Secondary | ICD-10-CM | POA: Diagnosis not present

## 2018-05-24 DIAGNOSIS — R51 Headache: Secondary | ICD-10-CM | POA: Diagnosis present

## 2018-05-24 LAB — CBC
HCT: 47.5 % — ABNORMAL HIGH (ref 36.0–46.0)
Hemoglobin: 15.6 g/dL — ABNORMAL HIGH (ref 12.0–15.0)
MCH: 28.8 pg (ref 26.0–34.0)
MCHC: 32.8 g/dL (ref 30.0–36.0)
MCV: 87.6 fL (ref 80.0–100.0)
Platelets: 259 10*3/uL (ref 150–400)
RBC: 5.42 MIL/uL — ABNORMAL HIGH (ref 3.87–5.11)
RDW: 13.6 % (ref 11.5–15.5)
WBC: 7.5 10*3/uL (ref 4.0–10.5)
nRBC: 0 % (ref 0.0–0.2)

## 2018-05-24 LAB — I-STAT TROPONIN, ED: Troponin i, poc: 0 ng/mL (ref 0.00–0.08)

## 2018-05-24 LAB — BASIC METABOLIC PANEL
Anion gap: 11 (ref 5–15)
BUN: 7 mg/dL (ref 6–20)
CHLORIDE: 107 mmol/L (ref 98–111)
CO2: 23 mmol/L (ref 22–32)
Calcium: 8.8 mg/dL — ABNORMAL LOW (ref 8.9–10.3)
Creatinine, Ser: 0.88 mg/dL (ref 0.44–1.00)
GFR calc Af Amer: >60 mL/min (ref >60)
GFR calc non Af Amer: >60 mL/min (ref >60)
Glucose, Bld: 146 mg/dL — ABNORMAL HIGH (ref 70–99)
Potassium: 3.3 mmol/L — ABNORMAL LOW (ref 3.5–5.1)
Sodium: 141 mmol/L (ref 135–145)

## 2018-05-24 LAB — I-STAT BETA HCG BLOOD, ED (MC, WL, AP ONLY): I-stat hCG, quantitative: <5 m[IU]/mL (ref <5)

## 2018-05-24 MED ORDER — SULFAMETHOXAZOLE-TRIMETHOPRIM 800-160 MG PO TABS
1.0000 | ORAL_TABLET | Freq: Once | ORAL | Status: AC
  Administered 2018-05-24: 1 via ORAL
  Filled 2018-05-24: qty 1

## 2018-05-24 MED ORDER — ONDANSETRON HCL 4 MG/2ML IJ SOLN: 4.0000 mg | Freq: Once | INTRAMUSCULAR | Status: DC

## 2018-05-24 MED ORDER — SODIUM CHLORIDE 0.9 % IV BOLUS: 500.0000 mL | Freq: Once | INTRAVENOUS | Status: DC

## 2018-05-24 MED ORDER — SODIUM CHLORIDE 0.9 % IV SOLN: INTRAVENOUS | Status: DC

## 2018-05-24 MED ORDER — SULFAMETHOXAZOLE-TRIMETHOPRIM 800-160 MG PO TABS: 1.0000 | ORAL_TABLET | Freq: Two times a day (BID) | ORAL | 0 refills | Status: DC

## 2018-05-24 MED ORDER — ALBUTEROL SULFATE HFA 108 (90 BASE) MCG/ACT IN AERS
2.0000 | INHALATION_SPRAY | Freq: Once | RESPIRATORY_TRACT | Status: AC
  Administered 2018-05-24: 2 via RESPIRATORY_TRACT
  Filled 2018-05-24: qty 6.7

## 2018-05-24 NOTE — ED Triage Notes (Addendum)
Patient arrived from home reporting central chest pain that radiates to the right arm for about 4 weeks with right side "brain" pressure-she reports that she has been taking tylenol for the headache without results. Denies chest pain at this time, stating "I feel pressure like congestion in chest, I've been coughing a lot, I cant sleep, and I cant hear out of my ear."

## 2018-05-24 NOTE — Discharge Instructions (Addendum)
Your symptoms seem to be caused by bronchitis and sinusitis.  We sent a prescription to your pharmacy to take to treat the infection.  For nasal congestion, and left ear pain, try using your fluticasone nasal spray 1 in each nostril, twice a day for a week or 2.  For pain, use Tylenol and Motrin.  Return here, if needed, for problems.

## 2018-05-24 NOTE — ED Notes (Signed)
Pt has removed mon itoring equipment and states she was told by MD to get dressed.Pt inmstructed in use of inhaler.

## 2018-05-24 NOTE — ED Notes (Signed)
Pt discharged from ED; instructions provided and scripts given; Pt encouraged to return to ED if symptoms worsen and to f/u with PCP; Pt verbalized understanding of all instructions 

## 2018-05-24 NOTE — ED Notes (Signed)
Pt lc/o chest congestion and right arm numbness x 4 weeks. Pt also c/o pain and drainage at right ear. Pt provided warm blanket.

## 2018-05-24 NOTE — ED Provider Notes (Signed)
[facility] EMERGENCY DEPARTMENT Provider Note   CSN: [id number] Arrival date & time: 05/24/18  9892     History   Chief Complaint No chief complaint on file.   HPI Chelsea Branch is a 53 y.o. female.  HPI   She presents for evaluation of cough with sputum production, nasal congestion, headache, left ear discomfort and decreased hearing, all for several weeks.  She is a smoker.  She denies fever, chills, nausea or vomiting.  Is taking her usual medications.  There are no other known modifying factors.   Past Medical History:  Diagnosis Date  . Abnormal thyroid blood test 03/15/2017  . Abnormal uterine bleeding   . Alopecia   . Anemia   . Angina pectoris with normal coronary arteriogram ([place]) 2017   Had + Troponin c/w ? NSTEMI due to A on C CHF  . 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 Chronic combined systolic and diastolic CHF (congestive heart failure) ([place])   . DYSLIPIDEMIA   . Essential hypertension   . Fibroids   . H/O noncompliance with medical treatment, presenting hazards to health   . Hypertension   . Hypokalemia 07/23/2016  . Menorrhagia   . 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 iniatially ?2/2 peripartum in 1994 - improved by 2008 then worsening EF in 2011 back down to EF 25-30%. b. echo 01/21/14 showed mod LVH, [name]; [name], [name], [name],   . 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 NSTEMI (non-ST elevated myocardial infarction) ([place]) 05/2015   Normal Coronaries.  . Peripartum cardiomyopathy 1994  . Sleep apnea 2015   CPAP 12/2013  . Stroke [facility]) 2011  . Systolic and diastolic CHF, acute on chronic ([place]) 06/14/2015  . Tobacco abuse     Patient Active Problem List   Diagnosis Date Noted  . Abnormal thyroid blood test 03/15/2017  . Hypokalemia 07/23/2016  . Low TSH level 04/26/2016  . Abnormal uterine bleeding 04/06/2016  . Nonspecific chest pain   . Nonischemic dilated  cardiomyopathy ([place]) 06/17/2015  . NSTEMI (non-ST elevated myocardial infarction) ([place]) - with normal cornaries on Cath 06/14/2015  . Systolic and diastolic CHF, acute on chronic ([place]) 06/14/2015  . H/O noncompliance with medical treatment, presenting hazards to health   . Low back pain 09/16/2014  . Homelessness 09/16/2014  . Vitamin D deficiency 01/29/2014  . OSA (obstructive sleep apnea) 01/08/2014  . Chronic combined systolic and diastolic CHF (congestive heart failure) ([place]) 08/14/2013  . Essential hypertension   . Fibroids 08/09/2012  . Menorrhagia 05/08/2012  . Female pattern hair loss   . HLD (hyperlipidemia) 06/08/2010  . CEREBRAL ANEURYSM 06/08/2010  . 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 CEREBROVASCULAR ACCIDENT, HX OF 06/08/2010    Past Surgical History:  Procedure Laterality Date  . CARDIAC CATHETERIZATION N/A 06/16/2015   Procedure: Left Heart Cath and Coronary Angiography;  Surgeon: Jettie Booze, MD;  Location: [facility] CV LAB;  Service: Cardiovascular;  Laterality: N/A;  . [place]  . LOOP RECORDER IMPLANT  ~ 2000  . TIBIAL TUBERCLERPLASTY    . TUBAL LIGATION  1994     OB History    Gravida  2   Para  2   Term  2   Preterm      AB      Living  3     SAB      TAB      Ectopic  Multiple  1   Live Births  3            Home Medications    Prior to Admission medications   Medication Sig Start Date End Date Taking? Authorizing Provider  aspirin 81 MG EC tablet Take 1 tablet (81 mg total) by mouth daily. 03/31/16   Milagros Loll, MD  azithromycin (ZITHROMAX) 250 MG tablet Take 1 tablet (250 mg total) by mouth daily. Take first 2 tablets together, then 1 every day until finished. 02/15/18   Wurst, [place], PA-C  Biotin 10000 MCG TABS Take 1 tablet by mouth daily. 04/26/16   Funches, Adriana Mccallum, MD  cyclobenzaprine (FLEXERIL) 10 MG tablet Take 1 tablet (10 mg total) by mouth 2 (two) times daily as needed for  muscle spasms. 06/28/17   Charlott Rakes, MD  Dextromethorphan-guaiFENesin (CORICIDIN HBP CONGESTION/COUGH) 10-200 MG CAPS Take 1 tablet by mouth as directed. Take as directed on packaging 02/12/18   [name], PA-C  diphenhydrAMINE (BENADRYL) 25 mg capsule Take 1 capsule (25 mg total) every 6 (six) hours as needed by mouth for itching or allergies. 04/10/17   Langston Masker B, PA-C  famotidine (PEPCID) 20 MG tablet Take 1 tablet (20 mg total) 2 (two) times daily by mouth. 04/10/17   Langston Masker B, PA-C  fluticasone (FLONASE) 50 MCG/ACT nasal spray Place 1 spray into both nostrils daily. 02/12/18   [name], PA-C  furosemide (LASIX) 40 MG tablet Take 40 mg by mouth 2 (two) times daily.    [provider]  ibuprofen (ADVIL,MOTRIN) 600 MG tablet Take 1 tablet (600 mg total) by mouth every 6 (six) hours as needed. 02/07/17   Woodroe Mode, MD  ketoconazole (NIZORAL) 2 % shampoo Apply 1 application topically 2 (two) times a week. 06/30/17   Charlott Rakes, MD  meloxicam (MOBIC) 15 MG tablet Take 1 tablet (15 mg total) by mouth daily. 09/23/17   Margarita Mail, PA-C  metoprolol succinate (TOPROL-XL) 50 MG 24 hr tablet Take 1 tablet (50 mg total) by mouth daily. 06/23/17   Baldwin Jamaica, PA-C  potassium chloride SA (KLOR-CON M20) 20 MEQ tablet Take 2 tablets (40 mEq total) by mouth 2 (two) times daily. 12/20/17   Baldwin Jamaica, PA-C  sacubitril-valsartan (ENTRESTO) 97-103 MG Take 1 tablet by mouth 2 (two) times daily. 06/23/17   Baldwin Jamaica, PA-C  simvastatin (ZOCOR) 10 MG tablet Take 1 tablet (10 mg total) by mouth every evening. 06/23/17   Baldwin Jamaica, PA-C  spironolactone (ALDACTONE) 25 MG tablet Take 0.5 tablets (12.5 mg total) by mouth daily. 06/23/17   Baldwin Jamaica, PA-C  sulfamethoxazole-trimethoprim (BACTRIM DS,SEPTRA DS) 800-160 MG tablet Take 1 tablet by mouth 2 (two) times daily. 05/24/18   Daleen Bo, MD    Family History Family History  Problem  Relation Age of Onset  . Cancer Maternal Grandmother        uterine  . Hypertension Sister   . Other Neg Hx   . Heart disease Neg Hx     Social History Social History   Tobacco Use  . Smoking status: Current Some Day Smoker    Packs/day: 0.10    Years: 30.00    Pack years: 3.00    Types: Cigarettes    Last attempt to quit: 06/03/2015    Years since quitting: 2.9  . Smokeless tobacco: Never Used  Substance Use Topics  . Alcohol use: No    Alcohol/week: 0.0 standard drinks  .  Drug use: No     Allergies   Ace inhibitors   Review of Systems Review of Systems  All other systems reviewed and are negative.    Physical Exam Updated Vital Signs BP 109/69   Pulse 63   Temp 97.8 F (36.6 C) (Oral)   Resp 15   Ht 5\' 8"  (1.727 m)   Wt 90.7 kg   SpO2 95%   BMI 30.41 kg/m   Physical Exam Vitals signs and nursing note reviewed.  Constitutional:      General: She is not in acute distress.    Appearance: Normal appearance. She is well-developed. She is not ill-appearing or diaphoretic.  HENT:     Head: Normocephalic and atraumatic.     Right Ear: Tympanic membrane, ear canal and external ear normal.     Left Ear: Tympanic membrane, ear canal and external ear normal.  Eyes:     Conjunctiva/sclera: Conjunctivae normal.     Pupils: Pupils are equal, round, and reactive to light.  Neck:     Musculoskeletal: Normal range of motion and neck supple.     Trachea: Phonation normal.  Cardiovascular:     Rate and Rhythm: Normal rate and regular rhythm.     Heart sounds: Normal heart sounds.  Pulmonary:     Effort: Pulmonary effort is normal. No respiratory distress.     Breath sounds: Normal breath sounds. No stridor. No wheezing or rhonchi.  Abdominal:     Palpations: Abdomen is soft.     Tenderness: There is no abdominal tenderness.  Musculoskeletal: Normal range of motion.  Skin:    General: Skin is warm and dry.  Neurological:     Mental Status: She is alert and  oriented to person, place, and time.     Cranial Nerves: No cranial nerve deficit.     Sensory: No sensory deficit.     Motor: No abnormal muscle tone.     Coordination: Coordination normal.  Psychiatric:        Behavior: Behavior normal.        Thought Content: Thought content normal.        Judgment: Judgment normal.      ED Treatments / Results  Labs (all labs ordered are listed, but only abnormal results are displayed) Labs Reviewed  BASIC METABOLIC PANEL - Abnormal; Notable for the following components:      Result Value   Potassium 3.3 (*)    Glucose, Bld 146 (*)    Calcium 8.8 (*)    All other components within normal limits  CBC - Abnormal; Notable for the following components:   RBC 5.42 (*)    Hemoglobin 15.6 (*)    HCT 47.5 (*)    All other components within normal limits  I-STAT TROPONIN, ED  I-STAT BETA HCG BLOOD, ED ([facility], [facility], [facility] ONLY)    EKG EKG Interpretation  Date/Time:  Wednesday May 24 2018 08:30:21 EST Ventricular Rate:  74 PR Interval:  174 QRS Duration: 88 QT Interval:  408 QTC Calculation: 452 R Axis:   17 Text Interpretation:  Normal sinus rhythm Low voltage QRS Cannot rule out Anterior infarct , age undetermined Abnormal ECG Since last tracing QT has shortened Confirmed by Daleen Bo [phone]) on 05/24/2018 9:53:59 AM   Radiology Dg Chest 2 View  Result Date: 05/24/2018 CLINICAL DATA:  Chest pain. EXAM: CHEST - 2 VIEW COMPARISON:  02/15/2018 FINDINGS: The heart size and mediastinal contours are within normal limits. Both lungs are clear.  The visualized skeletal structures are unremarkable. IMPRESSION: No active cardiopulmonary disease. Electronically Signed   By: Lorriane Shire M.D.   On: 05/24/2018 09:29    Procedures Procedures (including critical care time)  Medications Ordered in ED Medications  albuterol (PROVENTIL HFA;VENTOLIN HFA) 108 (90 Base) MCG/ACT inhaler 2 puff (2 puffs Inhalation Given 05/24/18 1119)    sulfamethoxazole-trimethoprim (BACTRIM DS,SEPTRA DS) 800-160 MG per tablet 1 tablet (1 tablet Oral Given 05/24/18 1119)     Initial Impression / Assessment and Plan / ED Course  I have reviewed the triage vital signs and the nursing notes.  Pertinent labs & imaging results that were available during my care of the patient were reviewed by me and considered in my medical decision making (see chart for details).      Patient Vitals for the past 24 hrs:  BP Temp Temp src Pulse Resp SpO2 Height Weight  05/24/18 1030 109/69 - - 63 15 95 % - -  05/24/18 1000 94/81 - - 67 - 96 % - -  05/24/18 0835 - - - - - - 5\' 8"  (1.727 m) 90.7 kg  05/24/18 0832 115/81 97.8 F (36.6 C) Oral 74 18 97 % - -    12:20 PM Reevaluation with update and discussion. After initial assessment and treatment, an updated evaluation reveals no change in clinical exam.  Findings discussed and questions answered. Daleen Bo   Medical Decision Making: Nonspecific symptoms with likely findings for sinusitis and bronchitis.  Doubt pneumonia, metabolic instability or impending vascular collapse.  Nonspecific left ear pain likely related to fluid in the middle ear.  Doubt otitis media.  CRITICAL CARE-no Performed by: Daleen Bo  Nursing Notes Reviewed/ Care Coordinated Applicable Imaging Reviewed Interpretation of Laboratory Data incorporated into ED treatment  The patient appears reasonably screened and/or stabilized for discharge and I doubt any other medical condition or other [facility] requiring further screening, evaluation, or treatment in the ED at this time prior to discharge.  Plan: Home Medications-over-the-counter analgesia of choice; Home Treatments-rest, fluids; return here if the recommended treatment, does not improve the symptoms; Recommended follow up-PCP follow-up 1 week and as needed.   Final Clinical Impressions(s) / ED Diagnoses   Final diagnoses:  Sinusitis, unspecified chronicity, unspecified  location  Left ear pain  Bronchitis    ED Discharge Orders         Ordered    sulfamethoxazole-trimethoprim (BACTRIM DS,SEPTRA DS) 800-160 MG tablet  2 times daily     05/24/18 1218           Daleen Bo, MD 05/24/18 [phone]

## 2018-05-24 NOTE — ED Notes (Signed)
Patient initially upon entering room patient refused to get on gown.

## 2018-05-25 NOTE — Telephone Encounter (Signed)
noted 

## 2018-05-31 ENCOUNTER — Ambulatory Visit: Payer: Medicaid Other | Admitting: Nurse Practitioner

## 2018-06-16 ENCOUNTER — Ambulatory Visit (HOSPITAL_COMMUNITY)
Admission: EM | Admit: 2018-06-16 | Discharge: 2018-06-16 | Disposition: A | Discharge status: Home / Self Care | Payer: Medicaid Other | Attending: Emergency Medicine | Admitting: Emergency Medicine

## 2018-06-16 ENCOUNTER — Encounter (HOSPITAL_COMMUNITY): Payer: Self-pay

## 2018-06-16 DIAGNOSIS — J209 Acute bronchitis, unspecified: Secondary | ICD-10-CM | POA: Diagnosis not present

## 2018-06-16 MED ORDER — HYDROCODONE-HOMATROPINE 5-1.5 MG/5ML PO SYRP: 5.0000 mL | ORAL_SOLUTION | Freq: Every evening | ORAL | 0 refills | Status: DC | PRN

## 2018-06-16 MED ORDER — ALBUTEROL SULFATE HFA 108 (90 BASE) MCG/ACT IN AERS
2.0000 | INHALATION_SPRAY | Freq: Once | RESPIRATORY_TRACT | Status: AC
  Administered 2018-06-16: 2 via RESPIRATORY_TRACT

## 2018-06-16 MED ORDER — ALBUTEROL SULFATE HFA 108 (90 BASE) MCG/ACT IN AERS
INHALATION_SPRAY | RESPIRATORY_TRACT | Status: AC
  Filled 2018-06-16: qty 6.7

## 2018-06-16 MED ORDER — AZITHROMYCIN 250 MG PO TABS: 250.0000 mg | ORAL_TABLET | Freq: Every day | ORAL | 0 refills | Status: AC

## 2018-06-16 MED ORDER — FLUTICASONE PROPIONATE 50 MCG/ACT NA SUSP: 1.0000 | Freq: Every day | NASAL | 0 refills | Status: DC

## 2018-06-16 NOTE — ED Triage Notes (Signed)
Pt presents with persistent cough X 14 days with clear mucus and a little chest soreness.

## 2018-06-16 NOTE — Discharge Instructions (Signed)
Begin azithromycin Continue cetirizine Flonase 1-2 sprays each nostril daily Cough syrup at bedtime Albuterol inhaler 1-2 puffs as needed for shortness of breath

## 2018-06-17 ENCOUNTER — Other Ambulatory Visit: Payer: Self-pay | Admitting: Internal Medicine

## 2018-06-17 NOTE — ED Provider Notes (Signed)
[facility]    CSN: [id number] Arrival date & time: 06/16/18  1435     History   Chief Complaint Chief Complaint  Patient presents with  . Cough    HPI Chelsea Branch is a 53 y.o. female history of tobacco use, CHF, hypertension, previous CVA presenting today for evaluation of a cough.  Patient states that she has had a cough for the past 2 to 3 weeks.  She states that she has had mainly clear mucus and some slight discomfort in her chest.  She is also noted some nasal congestion.  Denies sore throat.  Denies fevers or shortness of breath.  She is also had some associated drainage and feels her ear popping.  She is having difficulty sleeping at nighttime due to the cough.  She has tried taking daily cetirizine as well as other over-the-counter cough medicines.  HPI  Past Medical History:  Diagnosis Date  . Abnormal thyroid blood test 03/15/2017  . Abnormal uterine bleeding   . Alopecia   . Anemia   . Angina pectoris with normal coronary arteriogram ([place]) 2017   Had + Troponin c/w ? NSTEMI due to A on C CHF  . 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 Chronic combined systolic and diastolic CHF (congestive heart failure) ([place])   . DYSLIPIDEMIA   . Essential hypertension   . Fibroids   . H/O noncompliance with medical treatment, presenting hazards to health   . Hypertension   . Hypokalemia 07/23/2016  . Menorrhagia   . 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 iniatially ?2/2 peripartum in 1994 - improved by 2008 then worsening EF in 2011 back down to EF 25-30%. b. echo 01/21/14 showed mod LVH, [name]; [name], [name], [name],   . 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 NSTEMI (non-ST elevated myocardial infarction) ([place]) 05/2015   Normal Coronaries.  . Peripartum cardiomyopathy 1994  . Sleep apnea 2015   CPAP 12/2013  . Stroke [facility]) 2011  . Systolic and diastolic CHF, acute on chronic ([place]) 06/14/2015  . Tobacco abuse       Patient Active Problem List   Diagnosis Date Noted  . Abnormal thyroid blood test 03/15/2017  . Hypokalemia 07/23/2016  . Low TSH level 04/26/2016  . Abnormal uterine bleeding 04/06/2016  . Nonspecific chest pain   . 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 NSTEMI (non-ST elevated myocardial infarction) ([place]) - with normal cornaries on Cath 06/14/2015  . Systolic and diastolic CHF, acute on chronic ([place]) 06/14/2015  . H/O noncompliance with medical treatment, presenting hazards to health   . Low back pain 09/16/2014  . Homelessness 09/16/2014  . Vitamin D deficiency 01/29/2014  . OSA (obstructive sleep apnea) 01/08/2014  . Chronic combined systolic and diastolic CHF (congestive heart failure) ([place]) 08/14/2013  . Essential hypertension   . Fibroids 08/09/2012  . Menorrhagia 05/08/2012  . Female pattern hair loss   . HLD (hyperlipidemia) 06/08/2010  . CEREBRAL ANEURYSM 06/08/2010  . 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 CEREBROVASCULAR ACCIDENT, HX OF 06/08/2010    Past Surgical History:  Procedure Laterality Date  . CARDIAC CATHETERIZATION N/A 06/16/2015   Procedure: Left Heart Cath and Coronary Angiography;  Surgeon: Jettie Booze, MD;  Location: [facility];  Service: Cardiovascular;  Laterality: N/A;  . [place]  . LOOP RECORDER IMPLANT  ~ 2000  . TIBIAL TUBERCLERPLASTY    . [place]  OB History    Gravida  2   Para  2   Term  2   Preterm      AB      Living  3     SAB      TAB      Ectopic      Multiple  1   Live Births  3            Home Medications    Prior to Admission medications   Medication Sig Start Date End Date Taking? Authorizing Provider  aspirin 81 MG EC tablet Take 1 tablet (81 mg total) by mouth daily. 03/31/16   Milagros Loll, MD  azithromycin (ZITHROMAX) 250 MG tablet Take 1 tablet (250 mg total) by mouth daily for 5 days. Take first 2 tablets together,  then 1 every day until finished. 06/16/18 06/21/18  Callan Yontz C, PA-C  Biotin 10000 MCG TABS Take 1 tablet by mouth daily. 04/26/16   Funches, Adriana Mccallum, MD  cyclobenzaprine (FLEXERIL) 10 MG tablet Take 1 tablet (10 mg total) by mouth 2 (two) times daily as needed for muscle spasms. 06/28/17   Charlott Rakes, MD  diphenhydrAMINE (BENADRYL) 25 mg capsule Take 1 capsule (25 mg total) every 6 (six) hours as needed by mouth for itching or allergies. 04/10/17   Langston Masker B, PA-C  famotidine (PEPCID) 20 MG tablet Take 1 tablet (20 mg total) 2 (two) times daily by mouth. 04/10/17   Langston Masker B, PA-C  fluticasone (FLONASE) 50 MCG/ACT nasal spray Place 1-2 sprays into both nostrils daily for 7 days. 06/16/18 06/23/18  Palmer Shorey C, PA-C  furosemide (LASIX) 40 MG tablet Take 40 mg by mouth 2 (two) times daily.    [provider]  HYDROcodone-homatropine (HYCODAN) 5-1.5 MG/5ML syrup Take 5 mLs by mouth at bedtime as needed for cough. 06/16/18   Candace Begue C, PA-C  ibuprofen (ADVIL,MOTRIN) 600 MG tablet Take 1 tablet (600 mg total) by mouth every 6 (six) hours as needed. 02/07/17   Woodroe Mode, MD  ketoconazole (NIZORAL) 2 % shampoo Apply 1 application topically 2 (two) times a week. 06/30/17   Charlott Rakes, MD  meloxicam (MOBIC) 15 MG tablet Take 1 tablet (15 mg total) by mouth daily. 09/23/17   Margarita Mail, PA-C  metoprolol succinate (TOPROL-XL) 50 MG 24 hr tablet Take 1 tablet (50 mg total) by mouth daily. 06/23/17   Baldwin Jamaica, PA-C  potassium chloride SA (KLOR-CON M20) 20 MEQ tablet Take 2 tablets (40 mEq total) by mouth 2 (two) times daily. 12/20/17   Baldwin Jamaica, PA-C  sacubitril-valsartan (ENTRESTO) 97-103 MG Take 1 tablet by mouth 2 (two) times daily. 06/23/17   Baldwin Jamaica, PA-C  simvastatin (ZOCOR) 10 MG tablet Take 1 tablet (10 mg total) by mouth every evening. 06/23/17   Baldwin Jamaica, PA-C  spironolactone (ALDACTONE) 25 MG tablet Take 0.5 tablets  (12.5 mg total) by mouth daily. 06/23/17   Baldwin Jamaica, PA-C    Family History Family History  Problem Relation Age of Onset  . Cancer Maternal Grandmother        uterine  . Hypertension Sister   . Other Neg Hx   . Heart disease Neg Hx     Social History Social History   Tobacco Use  . Smoking status: Current Some Day Smoker    Packs/day: 0.10    Years: 30.00    Pack years: 3.00    Types: Cigarettes  Last attempt to quit: 06/03/2015    Years since quitting: 3.0  . Smokeless tobacco: Never Used  Substance Use Topics  . Alcohol use: No    Alcohol/week: 0.0 standard drinks  . Drug use: No     Allergies   Ace inhibitors   Review of Systems Review of Systems  Constitutional: Negative for activity change, appetite change, chills, fatigue and fever.  HENT: Positive for congestion and rhinorrhea. Negative for ear pain, sinus pressure, sore throat and trouble swallowing.   Eyes: Negative for discharge and redness.  Respiratory: Positive for cough. Negative for chest tightness and shortness of breath.   Cardiovascular: Negative for chest pain.  Gastrointestinal: Negative for abdominal pain, diarrhea, nausea and vomiting.  Musculoskeletal: Negative for myalgias.  Skin: Negative for rash.  Neurological: Negative for dizziness, light-headedness and headaches.     Physical Exam Triage Vital Signs ED Triage Vitals  Enc Vitals Group     BP 06/16/18 1610 (!) 141/97     Pulse Rate 06/16/18 1610 89     Resp 06/16/18 1610 18     Temp 06/16/18 1610 98.5 F (36.9 C)     Temp Source 06/16/18 1610 Oral     SpO2 06/16/18 1610 100 %     Weight --      Height --      Head Circumference --      Peak Flow --      Pain Score 06/16/18 1611 3     Pain Loc --      Pain Edu? --      Excl. in [place]? --    No data found.  Updated Vital Signs BP (!) 141/97 (BP Location: Right Arm)   Pulse 89   Temp 98.5 F (36.9 C) (Oral)   Resp 18   SpO2 100%   Visual Acuity Right Eye  Distance:   Left Eye Distance:   Bilateral Distance:    Right Eye Near:   Left Eye Near:    Bilateral Near:     Physical Exam Vitals signs and nursing note reviewed.  Constitutional:      General: She is not in acute distress.    Appearance: She is well-developed.  HENT:     Head: Normocephalic and atraumatic.     Ears:     Comments: Bilateral ears without tenderness to palpation of external auricle, tragus and mastoid, EAC's without erythema or swelling, TM's with good bony landmarks and cone of light. Non erythematous.     Nose:     Comments: Nasal mucosa erythematous with swollen turbinates bilaterally    Mouth/Throat:     Comments: Oral mucosa pink and moist, no tonsillar enlargement or exudate. Posterior pharynx patent and nonerythematous, no uvula deviation or swelling. Normal phonation. Eyes:     Conjunctiva/sclera: Conjunctivae normal.  Neck:     Musculoskeletal: Neck supple.  Cardiovascular:     Rate and Rhythm: Normal rate and regular rhythm.     Heart sounds: No murmur.  Pulmonary:     Effort: Pulmonary effort is normal. No respiratory distress.     Breath sounds: Normal breath sounds.     Comments: Breathing comfortably at rest, CTABL, no wheezing, rales or other adventitious sounds auscultated  Anterior chest tender to palpation Chest:     Chest wall: Tenderness present.  Abdominal:     Palpations: Abdomen is soft.     Tenderness: There is no abdominal tenderness.  Skin:    General: Skin is warm and  dry.  Neurological:     Mental Status: She is alert.      UC Treatments / Results  Labs (all labs ordered are listed, but only abnormal results are displayed) Labs Reviewed - No data to display  EKG None  Radiology No results found.  Procedures Procedures (including critical care time)  Medications Ordered in UC Medications  albuterol (PROVENTIL HFA;VENTOLIN HFA) 108 (90 Base) MCG/ACT inhaler 2 puff (2 puffs Inhalation Given 06/16/18 1732)     Initial Impression / Assessment and Plan / UC Course  I have reviewed the triage vital signs and the nursing notes.  Pertinent labs & imaging results that were available during my care of the patient were reviewed by me and considered in my medical decision making (see chart for details).     Given length of cough will treat for bronchitis.  Per patient request provided albuterol inhaler to have at home to use as needed for shortness of breath and chest tightness.  Will also cover for atypical respiratory illness with azithromycin.  Added Flonase with cetirizine to help with swollen turbinates.  Provided Hycodan to use at bedtime for cough, continue over-the-counter cough syrup for during the day.  Discussed drowsiness regarding this and advised not to work or drive after taking.Discussed strict return precautions. Patient verbalized understanding and is agreeable with plan.  Final Clinical Impressions(s) / UC Diagnoses   Final diagnoses:  Acute bronchitis, unspecified organism     Discharge Instructions     Begin azithromycin Continue cetirizine Flonase 1-2 sprays each nostril daily Cough syrup at bedtime Albuterol inhaler 1-2 puffs as needed for shortness of breath   ED Prescriptions    Medication Sig Dispense Auth. Provider   azithromycin (ZITHROMAX) 250 MG tablet Take 1 tablet (250 mg total) by mouth daily for 5 days. Take first 2 tablets together, then 1 every day until finished. 6 tablet Skyy Mcknight C, PA-C   fluticasone (FLONASE) 50 MCG/ACT nasal spray Place 1-2 sprays into both nostrils daily for 7 days. 1 g Alaney Witter C, PA-C   HYDROcodone-homatropine (HYCODAN) 5-1.5 MG/5ML syrup Take 5 mLs by mouth at bedtime as needed for cough. 60 mL Austan Nicholl C, PA-C     Controlled Substance Prescriptions [place] Controlled Substance Registry consulted? Yes, I have consulted the [place] Controlled Substances Registry for this patient, and feel the risk/benefit ratio today is  favorable for proceeding with this prescription for a controlled substance.   Janith Lima, [place] 06/17/18 [phone]

## 2018-06-25 NOTE — Progress Notes (Signed)
Electrophysiology Office Note Date: 06/26/2018  ID:  Chelsea Branch, Chelsea Branch 1966/05/15, MRN [id number]  PCP: Charlott Rakes, MD Electrophysiologist: Caryl Comes  CC: follow up  Chelsea Branch is a 53 y.o. female seen today for Dr Caryl Comes.  She presents today for routine electrophysiology followup.  Since last being seen in our clinic, the patient reports doing relatively well.  She has recently had increasing shortness of breath with exertion, night sweats, and palpitations.   Past Medical History:  Diagnosis Date  . Abnormal thyroid blood test 03/15/2017  . Abnormal uterine bleeding   . Alopecia   . Anemia   . Angina pectoris with normal coronary arteriogram ([place]) 2017   Had + Troponin c/w ? NSTEMI due to A on C CHF  . 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 Chronic combined systolic and diastolic CHF (congestive heart failure) ([place])   . DYSLIPIDEMIA   . Essential hypertension   . Fibroids   . H/O noncompliance with medical treatment, presenting hazards to health   . Hypertension   . Hypokalemia 07/23/2016  . Menorrhagia   . 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 iniatially ?2/2 peripartum in 1994 - improved by 2008 then worsening EF in 2011 back down to EF 25-30%. b. echo 01/21/14 showed mod LVH, [name]; [name], [name], [name],   . 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 NSTEMI (non-ST elevated myocardial infarction) ([place]) 05/2015   Normal Coronaries.  . Peripartum cardiomyopathy 1994  . Sleep apnea 2015   CPAP 12/2013  . Stroke [facility]) 2011  . Systolic and diastolic CHF, acute on chronic ([place]) 06/14/2015  . Tobacco abuse    Past Surgical History:  Procedure Laterality Date  . CARDIAC CATHETERIZATION N/A 06/16/2015   Procedure: Left Heart Cath and Coronary Angiography;  Surgeon: Jettie Booze, MD;  Location: [facility];  Service: Cardiovascular;  Laterality: N/A;  . [place]  . LOOP RECORDER IMPLANT  ~  2000  . TIBIAL TUBERCLERPLASTY    . TUBAL LIGATION  1994    Current Outpatient Medications  Medication Sig Dispense Refill  . aspirin 81 MG EC tablet Take 1 tablet (81 mg total) by mouth daily. 30 tablet 0  . Biotin 10000 MCG TABS Take 1 tablet by mouth daily. 30 tablet 11  . cyclobenzaprine (FLEXERIL) 10 MG tablet Take 1 tablet (10 mg total) by mouth 2 (two) times daily as needed for muscle spasms. 60 tablet 1  . diphenhydrAMINE (BENADRYL) 25 mg capsule Take 1 capsule (25 mg total) every 6 (six) hours as needed by mouth for itching or allergies. 12 capsule 0  . famotidine (PEPCID) 20 MG tablet Take 1 tablet (20 mg total) 2 (two) times daily by mouth. 6 tablet 0  . furosemide (LASIX) 40 MG tablet Take 40 mg by mouth 2 (two) times daily.    Marland Kitchen ibuprofen (ADVIL,MOTRIN) 600 MG tablet Take 1 tablet (600 mg total) by mouth every 6 (six) hours as needed. 30 tablet 1  . ketoconazole (NIZORAL) 2 % shampoo Apply 1 application topically 2 (two) times a week. 120 mL 1  . KLOR-CON M20 20 MEQ tablet TAKE 2 TABLETS (40 MEQ TOTAL) BY MOUTH 2 (TWO) TIMES DAILY. 120 tablet 6  . meloxicam (MOBIC) 15 MG tablet Take 1 tablet (15 mg total) by mouth daily. 12 tablet 0  . sacubitril-valsartan (ENTRESTO) 97-103 MG Take 1 tablet by mouth 2 (two) times daily.  60 tablet 11  . simvastatin (ZOCOR) 10 MG tablet Take 1 tablet (10 mg total) by mouth every evening. 30 tablet 11  . spironolactone (ALDACTONE) 25 MG tablet Take 0.5 tablets (12.5 mg total) by mouth daily. 30 tablet 11  . fluticasone (FLONASE) 50 MCG/ACT nasal spray Place 1-2 sprays into both nostrils daily for 7 days. 1 g 0   No current facility-administered medications for this visit.     Allergies:   Ace inhibitors   Social History: Social History   Socioeconomic History  . Marital status: Single    Spouse name: Not on file  . Number of children: 3  . Years of education: 51  . Highest education level: Not on file  Occupational History  .  Occupation: unemployed  Social Needs  . Financial resource strain: Not on file  . Food insecurity:    Worry: Not on file    Inability: Not on file  . Transportation needs:    Medical: Not on file    Non-medical: Not on file  Tobacco Use  . Smoking status: Current Some Day Smoker    Packs/day: 0.10    Years: 30.00    Pack years: 3.00    Types: Cigarettes    Last attempt to quit: 06/03/2015    Years since quitting: 3.0  . Smokeless tobacco: Never Used  Substance and Sexual Activity  . Alcohol use: No    Alcohol/week: 0.0 standard drinks  . Drug use: No  . Sexual activity: Yes    Birth control/protection: I.U.D.  Lifestyle  . Physical activity:    Days per week: Not on file    Minutes per session: Not on file  . Stress: Not on file  Relationships  . Social connections:    Talks on phone: Not on file    Gets together: Not on file    Attends religious service: Not on file    Active member of club or organization: Not on file    Attends meetings of clubs or organizations: Not on file    Relationship status: Not on file  . Intimate partner violence:    Fear of current or ex partner: Not on file    Emotionally abused: Not on file    Physically abused: Not on file    Forced sexual activity: Not on file  Other Topics Concern  . Not on file  Social History Narrative   Lives at home with 54 yo twins ([place] and [place])   60 yo son lives outside the home   66 yo granddaughter     Family History: Family History  Problem Relation Age of Onset  . Cancer Maternal Grandmother        uterine  . Hypertension Sister   . Other Neg Hx   . Heart disease Neg Hx     Review of Systems: All other systems reviewed and are otherwise negative except as noted above.   Physical Exam: VS:  BP 116/86   Pulse 90   Ht 5\' 8"  (1.727 m)   Wt 218 lb (98.9 kg)   BMI 33.15 kg/m  , BMI Body mass index is 33.15 kg/m. Wt Readings from Last 3 Encounters:  06/26/18 218 lb (98.9 kg)  05/24/18  200 lb (90.7 kg)  02/12/18 199 lb (90.3 kg)    GEN- The patient is well appearing, alert and oriented x 3 today.   HEENT: normocephalic, atraumatic; sclera clear, conjunctiva pink; hearing intact; oropharynx clear; neck supple  Lungs-  Clear to ausculation bilaterally, normal work of breathing.  No wheezes, rales, rhonchi Heart- Regular rate and rhythm  GI- soft, non-tender, non-distended, bowel sounds present  Extremities- no clubbing, cyanosis, trace BLE edema MS- no significant deformity or atrophy Skin- warm and dry, no rash or lesion  Psych- euthymic mood, full affect Neuro- strength and sensation are intact   EKG:  EKG is not ordered today.  Recent Labs: 05/24/2018: BUN 7; Creatinine, Ser 0.88; Hemoglobin 15.6; Platelets 259; Potassium 3.3; Sodium 141    Other studies Reviewed: Additional studies/ records that were reviewed today include: office notes  Assessment and Plan:  1.  Chronic systolic heart failure/NICM EF by most recent echo 40-45% Euvolemic on exam Continue current therapy She has had worsening shortness of breath with exertion recently. Will update echo  2.  HTN Stable No change required today  3.  Palpitations 48 hour holter monitor Increase Metoprolol to 75mg  daily  4.  Fatigue/night sweats Will check labs today    Current medicines are reviewed at length with the patient today.   The patient does not have concerns regarding her medicines.  The following changes were made today:  Increase Metoprolol to 75mg  daily  Labs/ tests ordered today include: BMET, CBC, TSH, holter monitor, echo  No orders of the defined types were placed in this encounter.    Disposition:   Follow up with Dr Caryl Comes 6 weeks   Signed, Chanetta Marshall, NP 06/26/2018 12:06 PM   [facility] [address] [place] [address] [phone] (office) [phone] (fax)

## 2018-06-26 ENCOUNTER — Encounter (INDEPENDENT_AMBULATORY_CARE_PROVIDER_SITE_OTHER): Payer: Self-pay

## 2018-06-26 ENCOUNTER — Encounter

## 2018-06-26 ENCOUNTER — Ambulatory Visit (INDEPENDENT_AMBULATORY_CARE_PROVIDER_SITE_OTHER): Payer: Medicaid Other | Admitting: Nurse Practitioner

## 2018-06-26 VITALS — BP 116/86 | HR 90 | Ht 68.0 in | Wt 218.0 lb

## 2018-06-26 DIAGNOSIS — G4730 Sleep apnea, unspecified: Secondary | ICD-10-CM

## 2018-06-26 DIAGNOSIS — I10 Essential (primary) hypertension: Secondary | ICD-10-CM | POA: Diagnosis not present

## 2018-06-26 DIAGNOSIS — R002 Palpitations: Secondary | ICD-10-CM

## 2018-06-26 DIAGNOSIS — I5022 Chronic systolic (congestive) heart failure: Secondary | ICD-10-CM

## 2018-06-26 DIAGNOSIS — R0602 Shortness of breath: Secondary | ICD-10-CM | POA: Diagnosis not present

## 2018-06-26 MED ORDER — METOPROLOL SUCCINATE ER 50 MG PO TB24: ORAL_TABLET | ORAL | 3 refills | Status: DC

## 2018-06-26 NOTE — Patient Instructions (Signed)
Medication Instructions:  INCREASE METOPROLOL SUCCINATE TO 75 mg daily (1 and a half pills)  If you need a refill on your cardiac medications before your next appointment, please call your pharmacy.   Lab work:TODAY CBC TSH CBC If you have labs (blood work) drawn today and your tests are completely normal, you will receive your results only by: Marland Kitchen MyChart Message (if you have MyChart) OR . A paper copy in the mail If you have any lab test that is abnormal or we need to change your treatment, we will call you to review the results.  Testing/Procedures: Your physician has requested that you have an echocardiogram. Echocardiography is a painless test that uses sound waves to create images of your heart. It provides your doctor with information about the size and shape of your heart and how well your heart's chambers and valves are working. This procedure takes approximately one hour. There are no restrictions for this procedure.  Your physician has recommended that you wear a 48 HOUR holter monitor. Holter monitors are medical devices that record the heart's electrical activity. Doctors most often use these monitors to diagnose arrhythmias. Arrhythmias are problems with the speed or rhythm of the heartbeat. The monitor is a small, portable device. You can wear one while you do your normal daily activities. This is usually used to diagnose what is causing palpitations/syncope (passing out).    Follow-Up: [place], you and your health needs are our priority.  As part of our continuing mission to provide you with exceptional heart care, we have created designated Provider Care Teams.  These Care Teams include your primary Cardiologist (physician) and Advanced Practice Providers (APPs -  Physician Assistants and Nurse Practitioners) who all work together to provide you with the care you need, when you need it. .   Any Other Special Instructions Will Be Listed Below (If  Applicable). PULMONARY REFERRAL PLEASE

## 2018-06-27 ENCOUNTER — Telehealth: Payer: Self-pay

## 2018-06-27 LAB — CBC
HEMOGLOBIN: 15.4 g/dL (ref 11.1–15.9)
Hematocrit: 46 % (ref 34.0–46.6)
MCH: 28.6 pg (ref 26.6–33.0)
MCHC: 33.5 g/dL (ref 31.5–35.7)
MCV: 85 fL (ref 79–97)
Platelets: 264 10*3/uL (ref 150–450)
RBC: 5.39 x10E6/uL — AB (ref 3.77–5.28)
RDW: 13.1 % (ref 11.7–15.4)
WBC: 7 10*3/uL (ref 3.4–10.8)

## 2018-06-27 LAB — BASIC METABOLIC PANEL
BUN/Creatinine Ratio: 12 (ref 9–23)
BUN: 10 mg/dL (ref 6–24)
CO2: 24 mmol/L (ref 20–29)
Calcium: 9.3 mg/dL (ref 8.7–10.2)
Chloride: 101 mmol/L (ref 96–106)
Creatinine, Ser: 0.81 mg/dL (ref 0.57–1.00)
GFR calc Af Amer: 96 mL/min/{1.73_m2} (ref >59)
GFR calc non Af Amer: 83 mL/min/{1.73_m2} (ref >59)
Glucose: 91 mg/dL (ref 65–99)
Potassium: 3.2 mmol/L — ABNORMAL LOW (ref 3.5–5.2)
Sodium: 143 mmol/L (ref 134–144)

## 2018-06-27 LAB — TSH: TSH: 0.517 u[IU]/mL (ref 0.450–4.500)

## 2018-06-27 NOTE — Telephone Encounter (Signed)
Notes recorded by Frederik Schmidt, RN on 06/27/2018 at 1:02 PM EST The patient has been notified of the lab results and verbalized understanding. All questions (if any) were answered. She has informed me that she is presently taking K-Dur 40 meq in the morning, but only 20 meq in the evening. I told her that she needs to take 40 meq bid. She verbalized understanding and will begin taking as prescribed. Frederik Schmidt, RN 06/27/2018 12:58 PM

## 2018-06-27 NOTE — Telephone Encounter (Signed)
-----   Message from Patsey Berthold, NP sent at 06/27/2018  9:12 AM EST ----- Please notify patient of lab results. Kidney function, TSH, CBC stable. K+ is low. Please confirm she is taking K-Dur 22meq twice daily. Thanks!

## 2018-07-07 ENCOUNTER — Ambulatory Visit (HOSPITAL_COMMUNITY): Payer: Medicaid Other | Attending: Cardiology

## 2018-07-07 ENCOUNTER — Ambulatory Visit (INDEPENDENT_AMBULATORY_CARE_PROVIDER_SITE_OTHER): Payer: Medicaid Other

## 2018-07-07 DIAGNOSIS — R0602 Shortness of breath: Secondary | ICD-10-CM | POA: Insufficient documentation

## 2018-07-07 DIAGNOSIS — G4730 Sleep apnea, unspecified: Secondary | ICD-10-CM

## 2018-07-07 DIAGNOSIS — I10 Essential (primary) hypertension: Secondary | ICD-10-CM | POA: Diagnosis not present

## 2018-07-07 DIAGNOSIS — R002 Palpitations: Secondary | ICD-10-CM | POA: Insufficient documentation

## 2018-07-07 DIAGNOSIS — I5022 Chronic systolic (congestive) heart failure: Secondary | ICD-10-CM

## 2018-07-10 ENCOUNTER — Telehealth: Payer: Self-pay

## 2018-07-10 NOTE — Telephone Encounter (Signed)
-----   Message from Patsey Berthold, NP sent at 07/08/2018  2:25 PM EST ----- Please notify patient of echo results. EF has normalized since last echo.  Thanks!

## 2018-07-10 NOTE — Telephone Encounter (Signed)
Notes recorded by Frederik Schmidt, RN on 07/10/2018 at 8:54 AM EST LPM with results and told her to call, if questions. 2/17 ------

## 2018-07-11 ENCOUNTER — Ambulatory Visit (HOSPITAL_COMMUNITY)
Admission: EM | Admit: 2018-07-11 | Discharge: 2018-07-11 | Disposition: A | Discharge status: Home / Self Care | Payer: Medicaid Other | Attending: Family Medicine | Admitting: Family Medicine

## 2018-07-11 ENCOUNTER — Encounter (HOSPITAL_COMMUNITY): Payer: Self-pay

## 2018-07-11 ENCOUNTER — Other Ambulatory Visit: Payer: Self-pay

## 2018-07-11 DIAGNOSIS — J22 Unspecified acute lower respiratory infection: Secondary | ICD-10-CM | POA: Diagnosis not present

## 2018-07-11 MED ORDER — BENZONATATE 200 MG PO CAPS: 200.0000 mg | ORAL_CAPSULE | Freq: Three times a day (TID) | ORAL | 0 refills | Status: AC | PRN

## 2018-07-11 MED ORDER — ALBUTEROL SULFATE HFA 108 (90 BASE) MCG/ACT IN AERS
INHALATION_SPRAY | RESPIRATORY_TRACT | Status: AC
  Filled 2018-07-11: qty 6.7

## 2018-07-11 MED ORDER — HYDROCODONE-HOMATROPINE 5-1.5 MG/5ML PO SYRP: 5.0000 mL | ORAL_SOLUTION | Freq: Every day | ORAL | 0 refills | Status: DC

## 2018-07-11 MED ORDER — DOXYCYCLINE HYCLATE 100 MG PO CAPS: 100.0000 mg | ORAL_CAPSULE | Freq: Two times a day (BID) | ORAL | 0 refills | Status: AC

## 2018-07-11 MED ORDER — ALBUTEROL SULFATE HFA 108 (90 BASE) MCG/ACT IN AERS
1.0000 | INHALATION_SPRAY | Freq: Once | RESPIRATORY_TRACT | Status: AC
  Administered 2018-07-11: 2 via RESPIRATORY_TRACT

## 2018-07-11 NOTE — ED Provider Notes (Signed)
[place]    CSN: [id number] Arrival date & time: 07/11/18  0802     History   Chief Complaint Chief Complaint  Patient presents with  . Cough    HPI Chelsea Branch is a 53 y.o. female history of CHF, hypertension, OSA, previous tobacco use presenting today for evaluation of a cough.  Patient states that she has had a cough over the past 1 to 2 weeks.  She had similar symptoms towards the end of January, improved for a little bit but symptoms came on again.  She had improvement previously with the Hycodan cough syrup.  She has had nasal congestion/rhinorrhea with her cough as well.  Denies sore throat.  Denies fevers.  Denies chest pain or shortness of breath.  Does note to have some occasional wheezing.  She notes that she is out of her albuterol inhaler which helped previously as well.  HPI  Past Medical History:  Diagnosis Date  . Abnormal thyroid blood test 03/15/2017  . Abnormal uterine bleeding   . Alopecia   . Anemia   . Angina pectoris with normal coronary arteriogram ([place]) 2017   Had + Troponin c/w ? NSTEMI due to A on C CHF  . 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 Chronic combined systolic and diastolic CHF (congestive heart failure) ([place])   . DYSLIPIDEMIA   . Essential hypertension   . Fibroids   . H/O noncompliance with medical treatment, presenting hazards to health   . Hypertension   . Hypokalemia 07/23/2016  . Menorrhagia   . 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 iniatially ?2/2 peripartum in 1994 - improved by 2008 then worsening EF in 2011 back down to EF 25-30%. b. echo 01/21/14 showed mod LVH, [name]; [name], [name], [name],   . 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 NSTEMI (non-ST elevated myocardial infarction) ([place]) 05/2015   Normal Coronaries.  . Peripartum cardiomyopathy 1994  . Sleep apnea 2015   CPAP 12/2013  . Stroke [facility]) 2011  . Systolic and diastolic CHF, acute on chronic ([place])  06/14/2015  . Tobacco abuse     Patient Active Problem List   Diagnosis Date Noted  . Abnormal thyroid blood test 03/15/2017  . Hypokalemia 07/23/2016  . Low TSH level 04/26/2016  . Abnormal uterine bleeding 04/06/2016  . Nonspecific chest pain   . 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 NSTEMI (non-ST elevated myocardial infarction) ([place]) - with normal cornaries on Cath 06/14/2015  . Systolic and diastolic CHF, acute on chronic ([place]) 06/14/2015  . H/O noncompliance with medical treatment, presenting hazards to health   . Low back pain 09/16/2014  . Homelessness 09/16/2014  . Vitamin D deficiency 01/29/2014  . OSA (obstructive sleep apnea) 01/08/2014  . Chronic combined systolic and diastolic CHF (congestive heart failure) ([place]) 08/14/2013  . Essential hypertension   . Fibroids 08/09/2012  . Menorrhagia 05/08/2012  . Female pattern hair loss   . HLD (hyperlipidemia) 06/08/2010  . CEREBRAL ANEURYSM 06/08/2010  . 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 CEREBROVASCULAR ACCIDENT, HX OF 06/08/2010    Past Surgical History:  Procedure Laterality Date  . CARDIAC CATHETERIZATION N/A 06/16/2015   Procedure: Left Heart Cath and Coronary Angiography;  Surgeon: Jettie Booze, MD;  Location: [place] [facility];  Service: Cardiovascular;  Laterality: N/A;  . [place]  . LOOP RECORDER IMPLANT  ~ 2000  . TIBIAL TUBERCLERPLASTY    .  TUBAL LIGATION  1994    OB History    Gravida  2   Para  2   Term  2   Preterm      AB      Living  3     SAB      TAB      Ectopic      Multiple  1   Live Births  3            Home Medications    Prior to Admission medications   Medication Sig Start Date End Date Taking? Authorizing Provider  aspirin 81 MG EC tablet Take 1 tablet (81 mg total) by mouth daily. 03/31/16   Milagros Loll, MD  benzonatate (TESSALON) 200 MG capsule Take 1 capsule (200 mg total) by mouth 3 (three) times daily  as needed for up to 7 days for cough (during day). 07/11/18 07/18/18  Titianna Loomis C, PA-C  Biotin 10000 MCG TABS Take 1 tablet by mouth daily. 04/26/16   Funches, Adriana Mccallum, MD  cyclobenzaprine (FLEXERIL) 10 MG tablet Take 1 tablet (10 mg total) by mouth 2 (two) times daily as needed for muscle spasms. 06/28/17   Charlott Rakes, MD  diphenhydrAMINE (BENADRYL) 25 mg capsule Take 1 capsule (25 mg total) every 6 (six) hours as needed by mouth for itching or allergies. 04/10/17   Langston Masker B, PA-C  doxycycline (VIBRAMYCIN) 100 MG capsule Take 1 capsule (100 mg total) by mouth 2 (two) times daily for 10 days. 07/11/18 07/21/18  Gaby Harney C, PA-C  famotidine (PEPCID) 20 MG tablet Take 1 tablet (20 mg total) 2 (two) times daily by mouth. 04/10/17   Langston Masker B, PA-C  fluticasone (FLONASE) 50 MCG/ACT nasal spray Place 1-2 sprays into both nostrils daily for 7 days. 06/16/18 06/23/18  Bonney Berres C, PA-C  furosemide (LASIX) 40 MG tablet Take 40 mg by mouth 2 (two) times daily.    [provider]  HYDROcodone-homatropine (HYCODAN) 5-1.5 MG/5ML syrup Take 5 mLs by mouth at bedtime. 07/11/18   Emonnie Cannady C, PA-C  ibuprofen (ADVIL,MOTRIN) 600 MG tablet Take 1 tablet (600 mg total) by mouth every 6 (six) hours as needed. 02/07/17   Woodroe Mode, MD  ketoconazole (NIZORAL) 2 % shampoo Apply 1 application topically 2 (two) times a week. 06/30/17   Newlin, Charlane Ferretti, MD  KLOR-CON M20 20 MEQ tablet TAKE 2 TABLETS (40 MEQ TOTAL) BY MOUTH 2 (TWO) TIMES DAILY. 06/19/18   Deboraha Sprang, MD  meloxicam (MOBIC) 15 MG tablet Take 1 tablet (15 mg total) by mouth daily. 09/23/17   Margarita Mail, PA-C  metoprolol succinate (TOPROL-XL) 50 MG 24 hr tablet Take 75 mg 1 and a half tablets daily 06/26/18   Chanetta Marshall K, NP  sacubitril-valsartan (ENTRESTO) 97-103 MG Take 1 tablet by mouth 2 (two) times daily. 06/23/17   Baldwin Jamaica, PA-C  simvastatin (ZOCOR) 10 MG tablet Take 1 tablet (10 mg total) by  mouth every evening. 06/23/17   Baldwin Jamaica, PA-C  spironolactone (ALDACTONE) 25 MG tablet Take 0.5 tablets (12.5 mg total) by mouth daily. 06/23/17   Baldwin Jamaica, PA-C    Family History Family History  Problem Relation Age of Onset  . Cancer Maternal Grandmother        uterine  . Hypertension Sister   . Other Neg Hx   . Heart disease Neg Hx     Social History Social History   Tobacco Use  .  Smoking status: Current Some Day Smoker    Packs/day: 0.10    Years: 30.00    Pack years: 3.00    Types: Cigarettes    Last attempt to quit: 06/03/2015    Years since quitting: 3.1  . Smokeless tobacco: Never Used  Substance Use Topics  . Alcohol use: No    Alcohol/week: 0.0 standard drinks  . Drug use: No     Allergies   Ace inhibitors   Review of Systems Review of Systems  Constitutional: Negative for activity change, appetite change, chills, fatigue and fever.  HENT: Positive for congestion and rhinorrhea. Negative for ear pain, sinus pressure, sore throat and trouble swallowing.   Eyes: Negative for discharge and redness.  Respiratory: Positive for cough and wheezing. Negative for chest tightness and shortness of breath.   Cardiovascular: Negative for chest pain.  Gastrointestinal: Negative for abdominal pain, diarrhea, nausea and vomiting.  Musculoskeletal: Negative for myalgias.  Skin: Negative for rash.  Neurological: Negative for dizziness, light-headedness and headaches.     Physical Exam Triage Vital Signs ED Triage Vitals  Enc Vitals Group     BP 07/11/18 0821 121/84     Pulse Rate 07/11/18 0821 80     Resp 07/11/18 0821 18     Temp 07/11/18 0821 98.2 F (36.8 C)     Temp Source 07/11/18 0821 Oral     SpO2 --      Weight 07/11/18 0822 210 lb (95.3 kg)     Height --      Head Circumference --      Peak Flow --      Pain Score 07/11/18 0822 7     Pain Loc --      Pain Edu? --      Excl. in [place]? --    No data found.  Updated Vital Signs BP  121/84 (BP Location: Right Arm)   Pulse 80   Temp 98.2 F (36.8 C) (Oral)   Resp 18   Wt 210 lb (95.3 kg)   BMI 31.93 kg/m   Visual Acuity Right Eye Distance:   Left Eye Distance:   Bilateral Distance:    Right Eye Near:   Left Eye Near:    Bilateral Near:     Physical Exam Vitals signs and nursing note reviewed.  Constitutional:      General: She is not in acute distress.    Appearance: She is well-developed.  HENT:     Head: Normocephalic and atraumatic.     Ears:     Comments: Bilateral ears without tenderness to palpation of external auricle, tragus and mastoid, EAC's without erythema or swelling, TM's with good bony landmarks and cone of light. Non erythematous.     Nose:     Comments: Nasal turbinates slightly erythematous and swollen, no rhinorrhea present    Mouth/Throat:     Comments: Oral mucosa pink and moist, no tonsillar enlargement or exudate. Posterior pharynx patent and nonerythematous, no uvula deviation or swelling. Normal phonation. Eyes:     Conjunctiva/sclera: Conjunctivae normal.  Neck:     Musculoskeletal: Neck supple.  Cardiovascular:     Rate and Rhythm: Normal rate and regular rhythm.     Heart sounds: No murmur.  Pulmonary:     Effort: Pulmonary effort is normal. No respiratory distress.     Breath sounds: Normal breath sounds.     Comments: Breathing comfortably at rest, CTABL, no wheezing, rales or other adventitious sounds auscultated Abdominal:  Palpations: Abdomen is soft.     Tenderness: There is no abdominal tenderness.  Skin:    General: Skin is warm and dry.  Neurological:     Mental Status: She is alert.      UC Treatments / Results  Labs (all labs ordered are listed, but only abnormal results are displayed) Labs Reviewed - No data to display  EKG None  Radiology No results found.  Procedures Procedures (including critical care time)  Medications Ordered in UC Medications  albuterol (PROVENTIL HFA;VENTOLIN  HFA) 108 (90 Base) MCG/ACT inhaler 1-2 puff (has no administration in time range)    Initial Impression / Assessment and Plan / UC Course  I have reviewed the triage vital signs and the nursing notes.  Pertinent labs & imaging results that were available during my care of the patient were reviewed by me and considered in my medical decision making (see chart for details).     Patient with URI symptoms and cough for the past 1 to 2 weeks.  Lungs clear, do not suspect underlying pneumonia at this time, vital signs stable.  Will initiate on doxycycline to cover for sinusitis as well as atypicals and lungs, albuterol inhaler provided in clinic, Tessalon during the day for cough, Hycodan at nighttime.  Continue Flonase from previous visit.  Will hold off on prednisone course at this time given no wheezing/adventitious sounds auscultated.Discussed strict return precautions. Patient verbalized understanding and is agreeable with plan.  Final Clinical Impressions(s) / UC Diagnoses   Final diagnoses:  Lower respiratory infection (e.g., bronchitis, pneumonia, pneumonitis, pulmonitis)     Discharge Instructions     Begin doxycycline twice daily for next 10 days Use albuterol inhaler as needed Tessalon for cough during the day Hycodan for bedtime/nighttime cough- do not drive or work after taking Continue flonase nasal spray 1-2 sprays each nostril daily  Follow up is symptoms persisting or worsening   ED Prescriptions    Medication Sig Dispense Auth. Provider   doxycycline (VIBRAMYCIN) 100 MG capsule Take 1 capsule (100 mg total) by mouth 2 (two) times daily for 10 days. 20 capsule Rieley Khalsa C, PA-C   benzonatate (TESSALON) 200 MG capsule Take 1 capsule (200 mg total) by mouth 3 (three) times daily as needed for up to 7 days for cough (during day). 28 capsule Kimiko Common C, PA-C   HYDROcodone-homatropine (HYCODAN) 5-1.5 MG/5ML syrup Take 5 mLs by mouth at bedtime. 60 mL Danylle Ouk,  Makhai Fulco C, PA-C     Controlled Substance Prescriptions [place] Controlled Substance Registry consulted? Yes, I have consulted the [place] Controlled Substances Registry for this patient, and feel the risk/benefit ratio today is favorable for proceeding with this prescription for a controlled substance.   Joneen Caraway [place] C, [place] 07/11/18 [phone]

## 2018-07-11 NOTE — Discharge Instructions (Signed)
Begin doxycycline twice daily for next 10 days Use albuterol inhaler as needed Tessalon for cough during the day Hycodan for bedtime/nighttime cough- do not drive or work after taking Continue flonase nasal spray 1-2 sprays each nostril daily  Follow up is symptoms persisting or worsening

## 2018-07-11 NOTE — ED Triage Notes (Signed)
Pt cc deep cough. Pt was here 1/24/ 2020. Pt states her cough is non stop.

## 2018-07-18 ENCOUNTER — Institutional Professional Consult (permissible substitution): Payer: Medicaid Other | Admitting: Pulmonary Disease

## 2018-07-24 ENCOUNTER — Telehealth: Payer: Self-pay

## 2018-07-24 NOTE — Telephone Encounter (Signed)
-----   Message from Patsey Berthold, NP sent at 07/23/2018  7:05 PM EST ----- Please notify patient of monitor results. No significant arrhythmias noted. Thanks!

## 2018-07-24 NOTE — Telephone Encounter (Signed)
Notes recorded by Frederik Schmidt, RN on 07/24/2018 at 8:07 AM EST Lpm with results and was told to call if questions. 3/2 ------

## 2018-07-25 ENCOUNTER — Other Ambulatory Visit: Payer: Self-pay | Admitting: Physician Assistant

## 2018-07-25 DIAGNOSIS — I5022 Chronic systolic (congestive) heart failure: Secondary | ICD-10-CM

## 2018-08-07 ENCOUNTER — Telehealth: Payer: Self-pay

## 2018-08-07 ENCOUNTER — Ambulatory Visit: Payer: Medicaid Other | Admitting: Internal Medicine

## 2018-08-07 NOTE — Telephone Encounter (Signed)
Please call and cancel 08/10/18 appointment; we will have Melissa call to reschedule in a few weeks. We are cancelling non-acute appointments at this time due to the coronavirus.

## 2018-08-07 NOTE — Telephone Encounter (Signed)
I tried to call and cancel patients appointment for today.

## 2018-08-07 NOTE — Telephone Encounter (Signed)
Patient called back I rescheduled her appt.

## 2018-08-10 ENCOUNTER — Ambulatory Visit: Payer: Medicaid Other | Admitting: Internal Medicine

## 2018-08-17 ENCOUNTER — Institutional Professional Consult (permissible substitution): Payer: Medicaid Other | Admitting: Pulmonary Disease

## 2018-08-30 ENCOUNTER — Telehealth: Payer: Self-pay | Admitting: Internal Medicine

## 2018-08-30 NOTE — Telephone Encounter (Signed)
  Pt c/o medication issue:  1. Name of Medication: ENTRESTO 97-103 MG  2. How are you currently taking this medication (dosage and times per day)? Na  3. Are you having a reaction (difficulty breathing--STAT)?  Na  4. What is your medication issue? Cover My Meds is calling to check to see if we received the PA for this medication and if we are going to be submitting it.

## 2018-08-30 NOTE — Telephone Encounter (Addendum)
**Note De-Identified Chelsea Branch Obfuscation** I have [place] at covermymeds and advised Chelsea Branch that we will send a Entresto PA form to the Chelsea Branch plan ([employer]) as it could not be done through covermymeds or over the phone.  Will go to the office to fill out the form no later than Tuesday 4/14 as I am working from home at this time.

## 2018-09-05 ENCOUNTER — Telehealth: Payer: Self-pay

## 2018-09-05 NOTE — Telephone Encounter (Signed)
**Note De-Identified Stephanne Greeley Obfuscation** I have completed a [place] [employer] [place] PA form for the pt, Dr Caryl Comes has signed it and I have faxed it to [employer].

## 2018-09-05 NOTE — Telephone Encounter (Signed)
**Note De-Identified Novalynn Branaman Obfuscation** I completed an [place] PA form and faxed it to [employer].

## 2018-09-12 ENCOUNTER — Telehealth: Payer: Self-pay

## 2018-09-12 NOTE — Telephone Encounter (Signed)
**Note De-Identified Jaysie Benthall Obfuscation** I called [place] [employer] and s/w Ita concerning this Entresto PA. Per Wayna Chalet this [place] PA was approved on 09/05/2018 and is good until 08/31/2019. PA#: [id number] Call reference#: [id number]  I have notified the pts pharmacy of this approval.

## 2018-09-12 NOTE — Telephone Encounter (Signed)
I called and left patient a message about upcoming appointment on 09/22/18. We need consent for telehealth visit.

## 2018-09-12 NOTE — Telephone Encounter (Signed)
I called [place] [employer] and s/w Ita concerning this Entresto PA. Per Wayna Chalet this [place] PA was approved on 09/05/2018 and is good until 08/31/2019. PA#: [id number] Call reference#: [id number]  I have notified the pts pharmacy of this approval.

## 2018-09-13 NOTE — Telephone Encounter (Signed)

## 2018-09-22 ENCOUNTER — Telehealth (INDEPENDENT_AMBULATORY_CARE_PROVIDER_SITE_OTHER): Payer: Medicaid Other | Admitting: Internal Medicine

## 2018-09-22 ENCOUNTER — Other Ambulatory Visit: Payer: Self-pay

## 2018-09-22 DIAGNOSIS — I5022 Chronic systolic (congestive) heart failure: Secondary | ICD-10-CM | POA: Diagnosis not present

## 2018-09-22 DIAGNOSIS — R002 Palpitations: Secondary | ICD-10-CM

## 2018-09-22 DIAGNOSIS — I10 Essential (primary) hypertension: Secondary | ICD-10-CM

## 2018-09-22 DIAGNOSIS — Z79899 Other long term (current) drug therapy: Secondary | ICD-10-CM

## 2018-09-22 MED ORDER — METOPROLOL SUCCINATE ER 50 MG PO TB24: ORAL_TABLET | ORAL | 3 refills | Status: DC

## 2018-09-22 MED ORDER — TORSEMIDE 20 MG PO TABS: 40.0000 mg | ORAL_TABLET | Freq: Every day | ORAL | 3 refills | Status: DC

## 2018-09-22 NOTE — Patient Instructions (Signed)
Called and discussed the following recommendations with Chelsea Branch. She has agreed to come into the office for a repeat BMP on May 22.   Medication Instructions:  Your physician has recommended you make the following change in your medication:   Take 180mg  of lasix for the next three days. Then discontinue lasix and begin taking Torsemide, 40mg  (two tablets) once per day   Labwork: Your physician recommends that you return for lab work in: 4 weeks for a BMP  Testing/Procedures: None ordered.   Follow-Up: Your physician recommends that you schedule a follow-up appointment in: 6 months with Dr Caryl Comes  Any Other Special Instructions Will Be Listed Below (If Applicable).     If you need a refill on your cardiac medications before your next appointment, please call your pharmacy.

## 2018-09-22 NOTE — Addendum Note (Signed)
Addended by: Dollene Primrose on: 09/22/2018 04:03 PM   Modules accepted: Orders

## 2018-09-22 NOTE — Progress Notes (Signed)
Electrophysiology TeleHealth Note   Due to national recommendations of social distancing due to COVID 19, an audio/video telehealth visit is felt to be most appropriate for this patient at this time.  See MyChart message from today for the patient's consent to telehealth for [facility].   Date:  09/22/2018   ID:  Chelsea Branch, DOB 19-Aug-1965, MRN [id number]  Location: patient's home  Provider location: [address], [place]  Evaluation Performed: Follow-up visit  PCP:  Charlott Rakes, MD  Cardiologist:     Electrophysiologist:  SK   Chief Complaint: Cardiomyopathy  History of Present Illness:    Chelsea Branch is a 53 y.o. female who presents via audio/video conferencing for a telehealth visit today. The patient did not have access to video technology/had technical difficulties with video requiring transitioning to audio format only (telephone).  All issues noted in this document were discussed and addressed.  No physical exam could be performed with this format.     Since last being seen in our clinic for cardiomyopathy and heart failure  the patient reports doing pretty well.  Recently has developed edema and DOE has worsened at thte same time   Echo 2;/20 interval normalization of LV dysfunction   Holter 2/20 scant PVC PACs    The patient denies symptoms of fevers, chills, cough, or new SOB worrisome for COVID 19.    Past Medical History:  Diagnosis Date  . Abnormal thyroid blood test 03/15/2017  . Abnormal uterine bleeding   . Alopecia   . Anemia   . Angina pectoris with normal coronary arteriogram ([place]) 2017   Had + Troponin c/w ? NSTEMI due to A on C CHF  . 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 Chronic combined systolic and diastolic CHF (congestive heart failure) ([place])   . DYSLIPIDEMIA   . Essential hypertension   . Fibroids   . H/O noncompliance with medical treatment, presenting hazards to health   . Hypertension   . Hypokalemia 07/23/2016   . Menorrhagia   . 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 iniatially ?2/2 peripartum in 1994 - improved by 2008 then worsening EF in 2011 back down to EF 25-30%. b. echo 01/21/14 showed mod LVH, [name]; [name], [name], [name],   . 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 NSTEMI (non-ST elevated myocardial infarction) ([place]) 05/2015   Normal Coronaries.  . Peripartum cardiomyopathy 1994  . Sleep apnea 2015   CPAP 12/2013  . Stroke [facility]) 2011  . Systolic and diastolic CHF, acute on chronic ([place]) 06/14/2015  . Tobacco abuse     Past Surgical History:  Procedure Laterality Date  . CARDIAC CATHETERIZATION N/A 06/16/2015   Procedure: Left Heart Cath and Coronary Angiography;  Surgeon: Jettie Booze, MD;  Location: [place] [facility];  Service: Cardiovascular;  Laterality: N/A;  . [place]  . LOOP RECORDER IMPLANT  ~ 2000  . TIBIAL TUBERCLERPLASTY    . TUBAL LIGATION  1994    Current Outpatient Medications  Medication Sig Dispense Refill  . Biotin 10000 MCG TABS Take 1 tablet by mouth daily. 30 tablet 11  . cyclobenzaprine (FLEXERIL) 10 MG tablet Take 1 tablet (10 mg total) by mouth 2 (two) times daily as needed for muscle spasms. 60 tablet 1  . ENTRESTO 97-103 MG TAKE 1 TABLET BY MOUTH TWICE A DAY 60 tablet 11  . famotidine (PEPCID) 20 MG tablet  Take 1 tablet (20 mg total) 2 (two) times daily by mouth. 6 tablet 0  . furosemide (LASIX) 40 MG tablet Take 1 tablet (40 mg total) by mouth 2 (two) times daily. 60 tablet 11  . ibuprofen (ADVIL,MOTRIN) 600 MG tablet Take 1 tablet (600 mg total) by mouth every 6 (six) hours as needed. 30 tablet 1  . KLOR-CON M20 20 MEQ tablet TAKE 2 TABLETS (40 MEQ TOTAL) BY MOUTH 2 (TWO) TIMES DAILY. 120 tablet 6  . metoprolol succinate (TOPROL-XL) 50 MG 24 hr tablet Take 75 mg 1 and a half tablets daily 90 tablet 3  . simvastatin (ZOCOR) 10 MG tablet TAKE 1 TABLET BY MOUTH EVERY DAY IN THE  EVENING 30 tablet 11  . spironolactone (ALDACTONE) 25 MG tablet TAKE 0.5 TABLETS (12.5 MG TOTAL) BY MOUTH DAILY. 15 tablet 11  . aspirin 81 MG EC tablet Take 1 tablet (81 mg total) by mouth daily. (Patient not taking: Reported on 09/22/2018) 30 tablet 0  . diphenhydrAMINE (BENADRYL) 25 mg capsule Take 1 capsule (25 mg total) every 6 (six) hours as needed by mouth for itching or allergies. (Patient not taking: Reported on 09/22/2018) 12 capsule 0  . ketoconazole (NIZORAL) 2 % shampoo Apply 1 application topically 2 (two) times a week. (Patient not taking: Reported on 09/22/2018) 120 mL 1  . meloxicam (MOBIC) 15 MG tablet Take 1 tablet (15 mg total) by mouth daily. (Patient not taking: Reported on 09/22/2018) 12 tablet 0   No current facility-administered medications for this visit.     Allergies:   Ace inhibitors   Social History:  The patient  reports that she has been smoking cigarettes. She has a 3.00 pack-year smoking history. She has never used smokeless tobacco. She reports that she does not drink alcohol or use drugs.   Family History:  The patient's   family history includes Cancer in her maternal grandmother; Hypertension in her sister.   ROS:  Please see the history of present illness.   All other systems are personally reviewed and negative.    Exam:    Vital Signs:   *     Labs/Other Tests and Data Reviewed:    Recent Labs: 06/26/2018: BUN 10; Creatinine, Ser 0.81; Hemoglobin 15.4; Platelets 264; Potassium 3.2; Sodium 143; TSH 0.517   Wt Readings from Last 3 Encounters:  07/11/18 210 lb (95.3 kg)  06/26/18 218 lb (98.9 kg)  05/24/18 200 lb (90.7 kg)     Other studies personally reviewed: Additional studies/ records that were reviewed today include: As above     ASSESSMENT & PLAN:    Nonischemic cardiomyopathy 40-45%>>60-65%  Congestive heart failure diastolic chronic   Hypertension-   Obstructive sleep apnea AHI 18   Volume overloaded with edema and dyspnea   We have discussed the physiology of heart failure including the importance of salt restriction and fluid restriction and have reviewed sources of dietary salt and water.  Her diet has a fair amount of unadded salt  Will change her diuretic from 80/40 >> 120 daily But will refill torsemide 40 mg daily  BMET in 4 weeks  Anxious to return to work, reviewed safe practices, masks, distancing, and hand washing    COVID 19 screen The patient denies symptoms of COVID 19 at this time.  The importance of social distancing was discussed today.  Follow-up:  *96m    Current medicines are reviewed at length with the patient today.   The patient does not have concerns  regarding her medicines.  The following changes were made today:  As above   Labs/ tests ordered today include:   No orders of the defined types were placed in this encounter.   Future tests ( post COVID )   in   months  Patient Risk:  after full review of this patients clinical status, I feel that they are at moderate  risk at this time.  Today, I have spent 10  minutes with the patient with telehealth technology discussing the above.  Signed, Virl Axe, MD  09/22/2018 11:06 AM     [facility] [address] [place] [address] [phone] (office) [phone] (fax)

## 2018-09-29 ENCOUNTER — Encounter: Payer: Self-pay | Admitting: Nurse Practitioner

## 2018-09-29 ENCOUNTER — Ambulatory Visit: Payer: Medicaid Other | Attending: Nurse Practitioner | Admitting: Nurse Practitioner

## 2018-09-29 ENCOUNTER — Other Ambulatory Visit: Payer: Self-pay

## 2018-09-29 DIAGNOSIS — Z87891 Personal history of nicotine dependence: Secondary | ICD-10-CM | POA: Diagnosis not present

## 2018-09-29 DIAGNOSIS — Z8673 Personal history of transient ischemic attack (TIA), and cerebral infarction without residual deficits: Secondary | ICD-10-CM | POA: Diagnosis not present

## 2018-09-29 DIAGNOSIS — M5442 Lumbago with sciatica, left side: Secondary | ICD-10-CM | POA: Diagnosis not present

## 2018-09-29 DIAGNOSIS — I252 Old myocardial infarction: Secondary | ICD-10-CM | POA: Insufficient documentation

## 2018-09-29 DIAGNOSIS — G4730 Sleep apnea, unspecified: Secondary | ICD-10-CM | POA: Insufficient documentation

## 2018-09-29 DIAGNOSIS — E785 Hyperlipidemia, unspecified: Secondary | ICD-10-CM | POA: Diagnosis not present

## 2018-09-29 DIAGNOSIS — I110 Hypertensive heart disease with heart failure: Secondary | ICD-10-CM | POA: Diagnosis not present

## 2018-09-29 DIAGNOSIS — G8929 Other chronic pain: Secondary | ICD-10-CM | POA: Diagnosis not present

## 2018-09-29 DIAGNOSIS — Z8249 Family history of ischemic heart disease and other diseases of the circulatory system: Secondary | ICD-10-CM | POA: Diagnosis not present

## 2018-09-29 DIAGNOSIS — I5042 Chronic combined systolic (congestive) and diastolic (congestive) heart failure: Secondary | ICD-10-CM | POA: Insufficient documentation

## 2018-09-29 MED ORDER — DULOXETINE HCL 30 MG PO CPEP: 30.0000 mg | ORAL_CAPSULE | Freq: Every day | ORAL | 3 refills | Status: DC

## 2018-09-29 MED ORDER — MISC. DEVICES MISC: 0 refills | Status: DC

## 2018-09-29 NOTE — Progress Notes (Signed)
Virtual Visit via Telephone Note Due to national recommendations of social distancing due to [place] 19, telehealth visit is felt to be most appropriate for this patient at this time.  I discussed the limitations, risks, security and privacy concerns of performing an evaluation and management service by telephone and the availability of in person appointments. I also discussed with the patient that there may be a patient responsible charge related to this service. The patient expressed understanding and agreed to proceed.    I connected with Chelsea Branch on 09/29/18  at   2:30 PM EDT  EDT by telephone and verified that I am speaking with the correct person using two identifiers.   Consent I discussed the limitations, risks, security and privacy concerns of performing an evaluation and management service by telephone and the availability of in person appointments. I also discussed with the patient that there may be a patient responsible charge related to this service. The patient expressed understanding and agreed to proceed.   Location of Patient: Private Residence    Location of Provider: [place] participating in Telemedicine visit: Chelsea Rankins FNP-BC [place]    History of Present Illness: Telemedicine visit for: Chronic low Back Pain She has not seen her PCP in a year.   Recently had a televisit with Dr. Caryl Comes for NICM (EF 19-37%) chronic systolic CHF, HTN, and palpitations. She was switched from lasix to torsemide.  Past Medical History:  Diagnosis Date  . Abnormal thyroid blood test 03/15/2017  . Abnormal uterine bleeding   . Alopecia   . Anemia   . Angina pectoris with normal coronary arteriogram ([place]) 2017   Had + Troponin c/w ? NSTEMI due to A on C CHF  . 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 Chronic combined systolic and diastolic CHF (congestive heart failure) ([place])   . DYSLIPIDEMIA   . Essential  hypertension   . Fibroids   . H/O noncompliance with medical treatment, presenting hazards to health   . Hypertension   . Hypokalemia 07/23/2016  . Menorrhagia   . 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 iniatially ?2/2 peripartum in 1994 - improved by 2008 then worsening EF in 2011 back down to EF 25-30%. b. echo 01/21/14 showed mod LVH, [name]; [name], [name], [name],   . 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 NSTEMI (non-ST elevated myocardial infarction) ([place]) 05/2015   Normal Coronaries.  . Peripartum cardiomyopathy 1994  . Sleep apnea 2015   CPAP 12/2013  . Stroke [facility]) 2011  . Systolic and diastolic CHF, acute on chronic ([place]) 06/14/2015  . Tobacco abuse       Low Back Pain Has complaints today of chronic low back pain with poor pain control. Inciting event: [place] off a porch 2 years ago. Requesting a letter to state she can not go back to work due to her back pain.  States her job closed down "a while ago due to COVID-19. They reopened "a few weeks ago and she went to work for a few days and stays she can't go back due to her back pain. She is unable to recall any specific dates of when her employer shut down, when they reopened and when she went back. She works on a Tax [employer] and states it causes severe back pain when she is pressing. She was referred to a physical therapist 09-2017 however she never  showed up for her physical therapy appointment nor has she seen an orthopedist.  Describes pain as " hard pains in my back that make me feel like my back is going to fall in or I'm going to fall" .She endorses left sided sciatica.  Denies bowel or bladder incontinence. Has been using a broom stick for mobility assistance. Medications tried muscle relaxants, tylenol. Will order new xray. May need PT or ortho referral depending on results. She will need to follow up with her PCP regarding taking her out of work indefinitely for her back  pain. Will try her on cymbalta for her back pain.  Lumbar Xray 09-2017 1. Progressive degenerative changes of the lumbar facets at L4-5 and L5-S1. 2. Grade 1 anterolisthesis at L4-5 is new. 3. No acute abnormality. 4. Aortic atherosclerosis.  Past Surgical History:  Procedure Laterality Date  . CARDIAC CATHETERIZATION N/A 06/16/2015   Procedure: Left Heart Cath and Coronary Angiography;  Surgeon: Jettie Booze, MD;  Location: [place] CV LAB;  Service: Cardiovascular;  Laterality: N/A;  . [place]  . LOOP RECORDER IMPLANT  ~ 2000  . TIBIAL TUBERCLERPLASTY    . TUBAL LIGATION  1994    Family History  Problem Relation Age of Onset  . Cancer Maternal Grandmother        uterine  . Hypertension Sister   . Other Neg Hx   . Heart disease Neg Hx     Social History   Socioeconomic History  . Marital status: Single    Spouse name: Not on file  . Number of children: 3  . Years of education: 15  . Highest education level: Not on file  Occupational History  . Occupation: unemployed  Social Needs  . Financial resource strain: Not on file  . Food insecurity:    Worry: Not on file    Inability: Not on file  . Transportation needs:    Medical: Not on file    Non-medical: Not on file  Tobacco Use  . Smoking status: Former Smoker    Packs/day: 0.10    Years: 30.00    Pack years: 3.00    Types: Cigarettes    Last attempt to quit: 06/03/2015    Years since quitting: 3.3  . Smokeless tobacco: Never Used  . Tobacco comment: Pt. stated she stopped smoking a year ago. 09/29/2018  Substance and Sexual Activity  . Alcohol use: No    Alcohol/week: 0.0 standard drinks  . Drug use: No  . Sexual activity: Yes    Birth control/protection: I.U.D.  Lifestyle  . Physical activity:    Days per week: Not on file    Minutes per session: Not on file  . Stress: Not on file  Relationships  . Social connections:    Talks on phone: Not on file    Gets together: Not  on file    Attends religious service: Not on file    Active member of club or organization: Not on file    Attends meetings of clubs or organizations: Not on file    Relationship status: Not on file  Other Topics Concern  . Not on file  Social History Narrative   Lives at home with 54 yo twins ([place] and [place])   50 yo son lives outside the home   5 yo granddaughter      Observations/Objective: Awake, alert and oriented x 3   Review of Systems  Constitutional: Negative for fever, malaise/fatigue  and weight loss.  HENT: Negative.  Negative for nosebleeds.   Eyes: Negative.  Negative for blurred vision, double vision and photophobia.  Respiratory: Negative.  Negative for cough and shortness of breath.   Cardiovascular: Negative.  Negative for chest pain, palpitations and leg swelling.  Gastrointestinal: Negative.  Negative for heartburn, nausea and vomiting.  Musculoskeletal: Positive for back pain. Negative for myalgias.  Neurological: Negative.  Negative for dizziness, seizures and headaches.  Psychiatric/Behavioral: Negative.  Negative for suicidal ideas.    Assessment and Plan: Diagnoses and all orders for this visit:  Chronic bilateral low back pain with left-sided sciatica -     DULoxetine (CYMBALTA) 30 MG capsule; Take 1 capsule (30 mg total) by mouth daily for 30 days. -     DG Lumbar Spine Complete; Future Work on losing weight to help reduce back pain. May alternate with heat and ice application for pain relief. May also alternate with acetaminophen  as prescribed for back pain. Other alternatives include massage, acupuncture and water aerobics.  You must stay active and avoid a sedentary lifestyle. NSAIDs not recommended due to heart condition Straight cane ordered      Follow Up Instructions Return for follow up with PCP first available for disability.     I discussed the assessment and treatment plan with the patient. The patient was provided an opportunity to  ask questions and all were answered. The patient agreed with the plan and demonstrated an understanding of the instructions.   The patient was advised to call back or seek an in-person evaluation if the symptoms worsen or if the condition fails to improve as anticipated.  I provided 32 minutes of non-face-to-face time during this encounter including median intraservice time, reviewing previous notes, labs, imaging, medications and explaining diagnosis and management.  Gildardo Pounds, FNP-BC

## 2018-10-02 ENCOUNTER — Telehealth: Payer: Self-pay | Admitting: Family Medicine

## 2018-10-02 NOTE — Telephone Encounter (Signed)
Patient was called and informed that Cymbalta was given to her for her back pain.

## 2018-10-02 NOTE — Telephone Encounter (Signed)
Call was returned to patient a voicemail was left informing patient to return phone call.

## 2018-10-02 NOTE — Telephone Encounter (Signed)
New Message   Pt states she was prescribed a medication the helps with suicidal thoughts and depression and says that is the wrong medication she not did that. Please f/u

## 2018-10-10 ENCOUNTER — Encounter: Payer: Self-pay | Admitting: Family Medicine

## 2018-10-10 ENCOUNTER — Other Ambulatory Visit: Payer: Self-pay

## 2018-10-10 ENCOUNTER — Ambulatory Visit: Payer: Medicaid Other | Attending: Family Medicine | Admitting: Family Medicine

## 2018-10-10 DIAGNOSIS — M5441 Lumbago with sciatica, right side: Secondary | ICD-10-CM

## 2018-10-10 DIAGNOSIS — M5136 Other intervertebral disc degeneration, lumbar region: Secondary | ICD-10-CM | POA: Insufficient documentation

## 2018-10-10 DIAGNOSIS — G8929 Other chronic pain: Secondary | ICD-10-CM | POA: Diagnosis not present

## 2018-10-10 DIAGNOSIS — M51379 Other intervertebral disc degeneration, lumbosacral region without mention of lumbar back pain or lower extremity pain: Secondary | ICD-10-CM | POA: Insufficient documentation

## 2018-10-10 DIAGNOSIS — I420 Dilated cardiomyopathy: Secondary | ICD-10-CM | POA: Diagnosis not present

## 2018-10-10 DIAGNOSIS — M5442 Lumbago with sciatica, left side: Secondary | ICD-10-CM | POA: Diagnosis not present

## 2018-10-10 DIAGNOSIS — M5137 Other intervertebral disc degeneration, lumbosacral region: Secondary | ICD-10-CM | POA: Insufficient documentation

## 2018-10-10 MED ORDER — GABAPENTIN 300 MG PO CAPS: 300.0000 mg | ORAL_CAPSULE | Freq: Every day | ORAL | 3 refills | Status: DC

## 2018-10-10 MED ORDER — CYCLOBENZAPRINE HCL 10 MG PO TABS: 10.0000 mg | ORAL_TABLET | Freq: Two times a day (BID) | ORAL | 3 refills | Status: DC | PRN

## 2018-10-10 NOTE — Progress Notes (Signed)
Virtual Visit via Telephone Note  I connected with Chelsea Branch, on 10/10/2018 at 10:11 AM by telephone due to the COVID-19 pandemic and verified that I am speaking with the correct person using two identifiers.   Consent: I discussed the limitations, risks, security and privacy concerns of performing an evaluation and management service by telephone and the availability of in person appointments. I also discussed with the patient that there may be a patient responsible charge related to this service. The patient expressed understanding and agreed to proceed.   Location of Patient: Home  Location of Provider: Clinic   Persons participating in Telemedicine visit: Merle Whitehorn Farrington-CMA Dr. Felecia Shelling    History of Present Illness: Chelsea Branch is a 53 year old female with a history of CHF (EF 60 to 65% from 06/2018 ), hypertension who presents today for follow-up of low back pain. Her back pain is chronic and in her midline and is rated as a 10/10 with radiation to both lower extremities and sometimes associated with tingling.  She was prescribed Cymbalta by the nurse practitioner 11 days ago but she complains that has been ineffective and has been associated with excessive defecation.  I had prescribed Flexeril for her in the past which she had stated was beneficial at the time I saw her 1 year ago. This pain prevents her from prolonged standing and recently on attempting to exit her car her back gave out and she fell.  She is needing a note for work as she has to stand for prolonged periods of work would like to return next Monday.  Lumbar spine x-ray from 09/2017: IMPRESSION: 1. Progressive degenerative changes of the lumbar facets at L4-5 and L5-S1. 2. Grade 1 anterolisthesis at L4-5 is new. 3. No acute abnormality. 4. Aortic atherosclerosis.  With regards to her congestive heart failure she was seen by cardiology on 09/22/2018 at which time her Lasix dose had been  increased with a repeat basic metabolic panel ordered in 4 weeks. She denies dyspnea, chest pain, pedal edema and has a good exercise tolerance at this time.   Past Medical History:  Diagnosis Date  . Abnormal thyroid blood test 03/15/2017  . Abnormal uterine bleeding   . Alopecia   . Anemia   . Angina pectoris with normal coronary arteriogram ([place]) 2017   Had + Troponin c/w ? NSTEMI due to A on C CHF  . 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 Chronic combined systolic and diastolic CHF (congestive heart failure) ([place])   . DYSLIPIDEMIA   . Essential hypertension   . Fibroids   . H/O noncompliance with medical treatment, presenting hazards to health   . Hypertension   . Hypokalemia 07/23/2016  . Menorrhagia   . 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 iniatially ?2/2 peripartum in 1994 - improved by 2008 then worsening EF in 2011 back down to EF 25-30%. b. echo 01/21/14 showed mod LVH, [name]; [name], [name], [name],   . 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 NSTEMI (non-ST elevated myocardial infarction) ([place]) 05/2015   Normal Coronaries.  . Peripartum cardiomyopathy 1994  . Sleep apnea 2015   CPAP 12/2013  . Stroke [facility]) 2011  . Systolic and diastolic CHF, acute on chronic ([place]) 06/14/2015  . Tobacco abuse    Allergies  Allergen Reactions  . Ace Inhibitors Other (See Comments)    REACTION: Cough    Current Outpatient Medications on File Prior to Visit  Medication  Sig Dispense Refill  . aspirin 81 MG EC tablet Take 1 tablet (81 mg total) by mouth daily. 30 tablet 0  . Biotin 10000 MCG TABS Take 1 tablet by mouth daily. 30 tablet 11  . DULoxetine (CYMBALTA) 30 MG capsule Take 1 capsule (30 mg total) by mouth daily for 30 days. 30 capsule 3  . ENTRESTO 97-103 MG TAKE 1 TABLET BY MOUTH TWICE A DAY 60 tablet 11  . famotidine (PEPCID) 20 MG tablet Take 1 tablet (20 mg total) 2 (two) times daily by mouth. 6 tablet 0  . KLOR-CON M20  20 MEQ tablet TAKE 2 TABLETS (40 MEQ TOTAL) BY MOUTH 2 (TWO) TIMES DAILY. 120 tablet 6  . metoprolol succinate (TOPROL-XL) 50 MG 24 hr tablet Take 75 mg 1 and a half tablets daily 90 tablet 3  . Misc. Devices MISC Please provide patient with insurance approved straight cane 1 each 0  . simvastatin (ZOCOR) 10 MG tablet TAKE 1 TABLET BY MOUTH EVERY DAY IN THE EVENING 30 tablet 11  . spironolactone (ALDACTONE) 25 MG tablet TAKE 0.5 TABLETS (12.5 MG TOTAL) BY MOUTH DAILY. 15 tablet 11  . torsemide (DEMADEX) 20 MG tablet Take 2 tablets (40 mg total) by mouth daily. 180 tablet 3  . cyclobenzaprine (FLEXERIL) 10 MG tablet Take 1 tablet (10 mg total) by mouth 2 (two) times daily as needed for muscle spasms. (Patient not taking: Reported on 10/10/2018) 60 tablet 1   No current facility-administered medications on file prior to visit.     Observations/Objective: Awake, alert, oriented x3 Not in acute distress   CMP Latest Ref Rng & Units 06/26/2018 05/24/2018 12/16/2017  Glucose 65 - 99 mg/dL 91 146(H) 109(H)  BUN 6 - 24 mg/dL 10 7 6   Creatinine 0.57 - 1.00 mg/dL 0.81 0.88 0.79  Sodium 134 - 144 mmol/L 143 141 143  Potassium 3.5 - 5.2 mmol/L 3.2(L) 3.3(L) 3.0(L)  Chloride 96 - 106 mmol/L 101 107 101  CO2 20 - 29 mmol/L 24 23 24   Calcium 8.7 - 10.2 mg/dL 9.3 8.8(L) 9.2  Total Protein 6.1 - 8.1 g/dL - - -  Total Bilirubin 0.2 - 1.2 mg/dL - - -  Alkaline Phos 33 - 130 U/L - - -  AST 10 - 35 U/L - - -  ALT 6 - 29 U/L - - -      Assessment and Plan: 1. Chronic bilateral low back pain with bilateral sciatica Uncontrolled Referred to physical therapy Restarted Flexeril - cyclobenzaprine (FLEXERIL) 10 MG tablet; Take 1 tablet (10 mg total) by mouth 2 (two) times daily as needed for muscle spasms.  Dispense: 60 tablet; Refill: 3 - gabapentin (NEURONTIN) 300 MG capsule; Take 1 capsule (300 mg total) by mouth at bedtime.  Dispense: 30 capsule; Refill: 3 - Ambulatory referral to Physical Therapy  2.  Nonischemic dilated cardiomyopathy (HCC) EF has improved significantly to 60 to 65% compared to 40 to 45% previously Euvolemic Continue Entresto, metoprolol, spironolactone, torsemide  3. Degenerative disc disease at L5-S1 level See #1 above - Ambulatory referral to Physical Therapy   Follow Up Instructions: 3 months   I discussed the assessment and treatment plan with the patient. The patient was provided an opportunity to ask questions and all were answered. The patient agreed with the plan and demonstrated an understanding of the instructions.   The patient was advised to call back or seek an in-person evaluation if the symptoms worsen or if the condition fails to improve as  anticipated.     I provided 16 minutes total of non-face-to-face time during this encounter including median intraservice time, reviewing previous notes, labs, imaging, medications and explaining diagnosis and management.     Charlott Rakes, MD, FAAFP. [facility] [place], [place]   10/10/2018, 10:11 AM

## 2018-10-10 NOTE — Progress Notes (Signed)
Patient has been called and DOB has been verified. Patient has been screened and transferred to PCP to start phone visit.     

## 2018-10-13 ENCOUNTER — Other Ambulatory Visit: Payer: Medicaid Other

## 2018-10-19 ENCOUNTER — Encounter (HOSPITAL_COMMUNITY): Payer: Self-pay

## 2018-10-19 ENCOUNTER — Ambulatory Visit (INDEPENDENT_AMBULATORY_CARE_PROVIDER_SITE_OTHER): Payer: Medicaid Other

## 2018-10-19 ENCOUNTER — Ambulatory Visit (HOSPITAL_COMMUNITY)
Admission: EM | Admit: 2018-10-19 | Discharge: 2018-10-19 | Disposition: A | Discharge status: Home / Self Care | Payer: Medicaid Other | Attending: Family Medicine | Admitting: Family Medicine

## 2018-10-19 ENCOUNTER — Other Ambulatory Visit: Payer: Self-pay

## 2018-10-19 DIAGNOSIS — M5416 Radiculopathy, lumbar region: Secondary | ICD-10-CM | POA: Diagnosis not present

## 2018-10-19 DIAGNOSIS — M5136 Other intervertebral disc degeneration, lumbar region: Secondary | ICD-10-CM | POA: Diagnosis not present

## 2018-10-19 DIAGNOSIS — M545 Low back pain: Secondary | ICD-10-CM | POA: Diagnosis not present

## 2018-10-19 MED ORDER — METHYLPREDNISOLONE ACETATE 40 MG/ML IJ SUSP
INTRAMUSCULAR | Status: AC
  Filled 2018-10-19: qty 1

## 2018-10-19 MED ORDER — DICLOFENAC SODIUM 1 % TD GEL: 4.0000 g | Freq: Four times a day (QID) | TRANSDERMAL | 1 refills | Status: DC

## 2018-10-19 MED ORDER — ACETAMINOPHEN 500 MG PO TABS: 500.0000 mg | ORAL_TABLET | Freq: Four times a day (QID) | ORAL | 0 refills | Status: DC | PRN

## 2018-10-19 MED ORDER — METHYLPREDNISOLONE ACETATE 40 MG/ML IJ SUSP
40.0000 mg | Freq: Once | INTRAMUSCULAR | Status: AC
  Administered 2018-10-19: 10:00:00 40 mg via INTRAMUSCULAR

## 2018-10-19 NOTE — Discharge Instructions (Addendum)
Your back pain is related to the degenerative disc disease that you have in your lumbar spine. This is causing your symptoms I am going to order physical therapy for you. Diclofenac gel 4 times a day as needed for pain and inflammation You can also take extra strength Tylenol every 6 hours as needed Giving you a steroid injection here in clinic today You may need to follow-up with orthopedic if your symptoms do not improve.

## 2018-10-19 NOTE — ED Triage Notes (Signed)
Pt cc back pain  X 1 week  and left leg numbness this started yesterday.  Pt back issue is on going pt states she is suppose to be in therapy.

## 2018-10-19 NOTE — ED Provider Notes (Signed)
[facility]    CSN: [id number] Arrival date & time: 10/19/18  0803     History   Chief Complaint Chief Complaint  Patient presents with  . Back Pain    HPI Chelsea Branch is a 53 y.o. female.   Pt is a 53 year old female that presents with chronic back pain.  This is been going on for over 4 months.  The pain started after she had a fall.  She has not had an x-ray of her back since.  She has been experiencing lower lumbar discomfort with pain and weakness radiating down both legs.  She does have numbness and tingling that is intermittent.  Reports that her legs give out on her at times.  She has been taking Flexeril and gabapentin without much relief of her symptoms.  She was initially thought to be going to physical therapy but never got scheduled.  Denies any saddle paresthesia, loss of bowel or bladder function.  The pain is worse with laying flat standing and she is having a hard time working.  No fevers, chills or urinary symptoms. Denies any upper extremity weakness, dizziness, headache, slurred speech.   ROS per HPI      Past Medical History:  Diagnosis Date  . Abnormal thyroid blood test 03/15/2017  . Abnormal uterine bleeding   . Alopecia   . Anemia   . Angina pectoris with normal coronary arteriogram ([place]) 2017   Had + Troponin c/w ? NSTEMI due to A on C CHF  . 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 Chronic combined systolic and diastolic CHF (congestive heart failure) ([place])   . DYSLIPIDEMIA   . Essential hypertension   . Fibroids   . H/O noncompliance with medical treatment, presenting hazards to health   . Hypertension   . Hypokalemia 07/23/2016  . Menorrhagia   . 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 iniatially ?2/2 peripartum in 1994 - improved by 2008 then worsening EF in 2011 back down to EF 25-30%. b. echo 01/21/14 showed mod LVH, [name]; [name], [name], [name],   . 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 NSTEMI (non-ST elevated myocardial infarction) ([place]) 05/2015   Normal Coronaries.  . Peripartum cardiomyopathy 1994  . Sleep apnea 2015   CPAP 12/2013  . Stroke [facility]) 2011  . Systolic and diastolic CHF, acute on chronic ([place]) 06/14/2015  . Tobacco abuse     Patient Active Problem List   Diagnosis Date Noted  . Degenerative disc disease at L5-S1 level 10/10/2018  . Abnormal thyroid blood test 03/15/2017  . Hypokalemia 07/23/2016  . Low TSH level 04/26/2016  . Abnormal uterine bleeding 04/06/2016  . Nonspecific chest pain   . Nonischemic dilated cardiomyopathy ([place]) 06/17/2015  . NSTEMI (non-ST elevated myocardial infarction) ([place]) - with normal cornaries on Cath 06/14/2015  . Systolic and diastolic CHF, acute on chronic ([place]) 06/14/2015  . H/O noncompliance with medical treatment, presenting hazards to health   . Low back pain 09/16/2014  . Homelessness 09/16/2014  . Vitamin D deficiency 01/29/2014  . OSA (obstructive sleep apnea) 01/08/2014  . Chronic combined systolic and diastolic CHF (congestive heart failure) ([place]) 08/14/2013  . Essential hypertension   . Fibroids 08/09/2012  . Menorrhagia 05/08/2012  . Female pattern hair loss   . HLD (hyperlipidemia) 06/08/2010  . CEREBRAL ANEURYSM 06/08/2010  . 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 CEREBROVASCULAR ACCIDENT, HX OF 06/08/2010    Past Surgical History:  Procedure Laterality Date  . CARDIAC CATHETERIZATION N/A 06/16/2015   Procedure: Left Heart Cath and Coronary Angiography;  Surgeon: Jettie Booze, MD;  Location: [facility];  Service: Cardiovascular;  Laterality: N/A;  . [place]  . LOOP RECORDER IMPLANT  ~ 2000  . TIBIAL TUBERCLERPLASTY    . TUBAL LIGATION  1994    OB History    Gravida  2   Para  2   Term  2   Preterm      AB      Living  3     SAB      TAB      Ectopic      Multiple  1   Live Births  3            Home Medications     Prior to Admission medications   Medication Sig Start Date End Date Taking? Authorizing Provider  acetaminophen (TYLENOL) 500 MG tablet Take 1 tablet (500 mg total) by mouth every 6 (six) hours as needed. 10/19/18   Loura Halt A, NP  aspirin 81 MG EC tablet Take 1 tablet (81 mg total) by mouth daily. 03/31/16   Milagros Loll, MD  Biotin 10000 MCG TABS Take 1 tablet by mouth daily. 04/26/16   Funches, Adriana Mccallum, MD  cyclobenzaprine (FLEXERIL) 10 MG tablet Take 1 tablet (10 mg total) by mouth 2 (two) times daily as needed for muscle spasms. 10/10/18   Charlott Rakes, MD  diclofenac sodium (VOLTAREN) 1 % GEL Apply 4 g topically 4 (four) times daily. 10/19/18   Loura Halt A, NP  DULoxetine (CYMBALTA) 30 MG capsule Take 1 capsule (30 mg total) by mouth daily for 30 days. 09/29/18 10/29/18  Gildardo Pounds, NP  ENTRESTO 97-103 MG TAKE 1 TABLET BY MOUTH TWICE A DAY 07/26/18   Baldwin Jamaica, PA-C  famotidine (PEPCID) 20 MG tablet Take 1 tablet (20 mg total) 2 (two) times daily by mouth. 04/10/17   Langston Masker B, PA-C  gabapentin (NEURONTIN) 300 MG capsule Take 1 capsule (300 mg total) by mouth at bedtime. 10/10/18   Newlin, Charlane Ferretti, MD  KLOR-CON M20 20 MEQ tablet TAKE 2 TABLETS (40 MEQ TOTAL) BY MOUTH 2 (TWO) TIMES DAILY. 06/19/18   Deboraha Sprang, MD  metoprolol succinate (TOPROL-XL) 50 MG 24 hr tablet Take 75 mg 1 and a half tablets daily 09/22/18   Deboraha Sprang, MD  Misc. Devices MISC Please provide patient with insurance approved straight cane 09/29/18   Gildardo Pounds, NP  simvastatin (ZOCOR) 10 MG tablet TAKE 1 TABLET BY MOUTH EVERY DAY IN THE EVENING 07/26/18   Baldwin Jamaica, PA-C  spironolactone (ALDACTONE) 25 MG tablet TAKE 0.5 TABLETS (12.5 MG TOTAL) BY MOUTH DAILY. 07/26/18   Baldwin Jamaica, PA-C  torsemide (DEMADEX) 20 MG tablet Take 2 tablets (40 mg total) by mouth daily. 09/22/18 09/17/19  Deboraha Sprang, MD    Family History Family History  Problem Relation Age of Onset  . Cancer  Maternal Grandmother        uterine  . Hypertension Sister   . Other Neg Hx   . Heart disease Neg Hx     Social History Social History   Tobacco Use  . Smoking status: Former Smoker    Packs/day: 0.10    Years: 30.00    Pack years: 3.00    Types: Cigarettes    Last attempt to quit: 06/03/2015  Years since quitting: 3.3  . Smokeless tobacco: Never Used  . Tobacco comment: Pt. stated she stopped smoking a year ago. 09/29/2018  Substance Use Topics  . Alcohol use: No    Alcohol/week: 0.0 standard drinks  . Drug use: No     Allergies   Ace inhibitors   Review of Systems Review of Systems   Physical Exam Triage Vital Signs ED Triage Vitals  Enc Vitals Group     BP 10/19/18 0819 97/64     Pulse Rate 10/19/18 0819 90     Resp 10/19/18 0819 18     Temp 10/19/18 0819 98.3 F (36.8 C)     Temp Source 10/19/18 0819 Oral     SpO2 10/19/18 0819 98 %     Weight 10/19/18 0817 200 lb (90.7 kg)     Height --      Head Circumference --      Peak Flow --      Pain Score 10/19/18 0817 8     Pain Loc --      Pain Edu? --      Excl. in [place]? --    No data found.  Updated Vital Signs BP 97/64 (BP Location: Left Arm)   Pulse 90   Temp 98.3 F (36.8 C) (Oral)   Resp 18   Wt 200 lb (90.7 kg)   SpO2 98%   BMI 30.41 kg/m   Visual Acuity Right Eye Distance:   Left Eye Distance:   Bilateral Distance:    Right Eye Near:   Left Eye Near:    Bilateral Near:     Physical Exam Vitals signs and nursing note reviewed.  Constitutional:      Appearance: Normal appearance.  HENT:     Head: Normocephalic and atraumatic.     Nose: Nose normal.  Eyes:     Conjunctiva/sclera: Conjunctivae normal.  Neck:     Musculoskeletal: Normal range of motion. No muscular tenderness.  Pulmonary:     Effort: Pulmonary effort is normal.  Musculoskeletal: Normal range of motion.        General: Tenderness present.     Comments: Tenderness to lower lumbar spine and paravertebral  musculature.  Skin:    General: Skin is warm.  Neurological:     General: No focal deficit present.     Mental Status: She is alert.  Psychiatric:        Mood and Affect: Mood normal.      UC Treatments / Results  Labs (all labs ordered are listed, but only abnormal results are displayed) Labs Reviewed - No data to display  EKG None  Radiology Dg Lumbar Spine Complete  Result Date: 10/19/2018 CLINICAL DATA:  Low back pain EXAM: LUMBAR SPINE - COMPLETE 4+ VIEW COMPARISON:  09/23/2017 FINDINGS: Normal alignment. Preserved vertebral body heights. No acute compression fracture, wedge-shaped deformity or focal kyphosis. Facet arthropathy at L4-5 and L5-S1 as before. Similar minor degenerative disc disease, most pronounced at L4-5 and L5-S1. No significant interval change. No visualized pars defects. Normal SI joints. IUD in the midline of the pelvis. Aortoiliac atherosclerosis present. IMPRESSION: Similar lower lumbar degenerative changes as above. No interval change or acute finding by plain radiography Atherosclerosis Electronically Signed   By: Jerilynn Mages.  Shick M.D.   On: 10/19/2018 09:02    Procedures Procedures (including critical care time)  Medications Ordered in UC Medications  methylPREDNISolone acetate (DEPO-MEDROL) injection 40 mg (has no administration in time range)    Initial  Impression / Assessment and Plan / UC Course  I have reviewed the triage vital signs and the nursing notes.  Pertinent labs & imaging results that were available during my care of the patient were reviewed by me and considered in my medical decision making (see chart for details).    Chronic back pain  Patient is a 53 year old female with chronic back pain. X-ray revealed degenerative disc disease in the L4-5 and L5-S1 This is most likely the cause of her symptoms. We will refer her to physical therapy as requested. Voltaren gel 4 times a day as needed Steroid injection given here in clinic She  can also do extra strength Tylenol as needed for back pain Instructed to follow-up with her doctor as needed  Final Clinical Impressions(s) / UC Diagnoses   Final diagnoses:  Degenerative disc disease, lumbar  Lumbar radiculopathy     Discharge Instructions     Your back pain is related to the degenerative disc disease that you have in your lumbar spine. This is causing your symptoms I am going to order physical therapy for you. Diclofenac gel 4 times a day as needed for pain and inflammation You can also take extra strength Tylenol every 6 hours as needed Giving you a steroid injection here in clinic today You may need to follow-up with orthopedic if your symptoms do not improve.      ED Prescriptions    Medication Sig Dispense Auth. Provider   diclofenac sodium (VOLTAREN) 1 % GEL Apply 4 g topically 4 (four) times daily. 100 g Isai Gottlieb A, NP   acetaminophen (TYLENOL) 500 MG tablet Take 1 tablet (500 mg total) by mouth every 6 (six) hours as needed. 30 tablet Loura Halt A, NP     Controlled Substance Prescriptions [place] Controlled Substance Registry consulted? Not Applicable   Orvan July, NP 10/19/18 1006

## 2018-10-27 ENCOUNTER — Other Ambulatory Visit: Payer: Self-pay

## 2018-10-27 ENCOUNTER — Encounter: Payer: Self-pay | Admitting: Physical Therapy

## 2018-10-27 ENCOUNTER — Ambulatory Visit: Payer: Medicaid Other | Attending: Family Medicine | Admitting: Physical Therapy

## 2018-10-27 DIAGNOSIS — M5441 Lumbago with sciatica, right side: Secondary | ICD-10-CM | POA: Insufficient documentation

## 2018-10-27 DIAGNOSIS — M5442 Lumbago with sciatica, left side: Secondary | ICD-10-CM | POA: Insufficient documentation

## 2018-10-27 DIAGNOSIS — G8929 Other chronic pain: Secondary | ICD-10-CM | POA: Insufficient documentation

## 2018-10-27 NOTE — Patient Instructions (Addendum)
Access Code: [email or web address]  URL: [email or web address]  Date: 10/27/2018  Prepared by: Elsie Ra   Exercises  Supine Piriformis Stretch - 3 reps - 1 sets - 30 hold - 2x daily - 6x weekly  Supine Lower Trunk Rotation - 2-3 reps - 1 sets - 10 hold - 2x daily - 6x weekly  Supine Posterior Pelvic Tilt - 10 reps - 2-3 sets - 2x daily - 6x weekly  Standing Lumbar Spine Flexion Stretch Counter - 10 reps - 1 sets - 10 hold - 2x daily - 6x weekly   TENS UNIT: This is helpful for muscle pain and spasm.   Search and Purchase a TENS 7000 2nd edition at www.tenspros.com. It should be less than $30.     TENS unit instructions: Do not shower or bathe with the unit on Turn the unit off before removing electrodes or batteries If the electrodes lose stickiness add a drop of water to the electrodes after they are disconnected from the unit and place on plastic sheet. If you continued to have difficulty, call the TENS unit company to purchase more electrodes. Do not apply lotion on the skin area prior to use. Make sure the skin is clean and dry as this will help prolong the life of the electrodes. After use, always check skin for unusual red areas, rash or other skin difficulties. If there are any skin problems, does not apply electrodes to the same area. Never remove the electrodes from the unit by pulling the wires. Do not use the TENS unit or electrodes other than as directed. Do not change electrode placement without consultating your therapist or physician. Keep 2 fingers with between each electrode. Wear time ratio is 2:1, on to off times.    For example on for 30 minutes off for 15 minutes and then on for 30 minutes off for 15 minutes

## 2018-10-29 ENCOUNTER — Encounter: Payer: Self-pay | Admitting: Physical Therapy

## 2018-10-29 NOTE — Therapy (Signed)
[place], [place], [address] Phone: [phone]   Fax:  [phone]  Physical Therapy Evaluation  Patient Details  Name: Chelsea Branch MRN: [id number] Date of Birth: 1966/02/02 Referring Provider (PT): Orvan July, NP   Encounter Date: 10/27/2018  PT End of Session - 10/29/18 1940    Visit Number  1    Number of Visits  4    Date for PT Re-Evaluation  11/26/18    Authorization Type  MCD    PT Start Time  1100    PT Stop Time  1145    PT Time Calculation (min)  45 min    Activity Tolerance  Patient tolerated treatment well    Behavior During Therapy  [facility] for tasks assessed/performed       Past Medical History:  Diagnosis Date  . Abnormal thyroid blood test 03/15/2017  . Abnormal uterine bleeding   . Alopecia   . Anemia   . Angina pectoris with normal coronary arteriogram ([place]) 2017   Had + Troponin c/w ? NSTEMI due to A on C CHF  . 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 Chronic combined systolic and diastolic CHF (congestive heart failure) ([place])   . DYSLIPIDEMIA   . Essential hypertension   . Fibroids   . H/O noncompliance with medical treatment, presenting hazards to health   . Hypertension   . Hypokalemia 07/23/2016  . Menorrhagia   . Nonischemic cardiomyopathy ([place]) 1994; 2017   a. iniatially ?2/2 peripartum in 1994 - improved by 2008 then worsening EF in 2011 back down to EF 25-30%. b. echo 01/21/14 showed mod LVH, [name]; [name], [name], [name],   . Nonischemic dilated cardiomyopathy ([place]) 06/17/2015  . NSTEMI (non-ST elevated myocardial infarction) ([place]) 05/2015   Normal Coronaries.  . Peripartum cardiomyopathy 1994  . Sleep apnea 2015   CPAP 12/2013  . Stroke [facility]) 2011  . Systolic and diastolic CHF, acute on chronic ([place]) 06/14/2015  . Tobacco abuse     Past Surgical History:  Procedure Laterality Date  . CARDIAC CATHETERIZATION N/A 06/16/2015   Procedure: Left Heart Cath and Coronary Angiography;  Surgeon: Jettie Booze, MD;  Location: [place] [facility];  Service: Cardiovascular;  Laterality: N/A;  . [place]  . LOOP RECORDER IMPLANT  ~ 2000  . TIBIAL TUBERCLERPLASTY    . TUBAL LIGATION  1994    There were no vitals filed for this visit.   Subjective Assessment - 10/29/18 1938    Subjective  Chronic LBP for past 4 months that started after a fall (slipped off porch and fell trying to get out of car)  with pain and weakness radiating down both legs.  She does have numbness and tingling that is intermittent.  Reports that her legs give out on her at times.  She has been taking Flexeril and gabapentin without much relief of her symptoms. 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 She saw MD, had XR showing DDD, was prescibed voltaren gel (says MCD does not cover so she hasnt been able to get), had injeciton, then was referred to PT.    Pertinent History  PMH:DDD,CVA in 2012,NSTEMI 2017, CHF,HTN    Limitations  Lifting;Sitting;Standing;Walking;Other (comment)   driving   How long can you sit comfortably?  20    How long can you stand  comfortably?  10    How long can you walk comfortably?  15    Diagnostic tests  XR showing DDD    Patient Stated Goals  get the pain down, be able to walk better without pain    Currently in Pain?  Yes    Pain Score  8     Pain Location  Back    Pain Orientation  Lower    Pain Descriptors / Indicators  Aching;Tightness;Constant;Burning    Pain Type  Chronic pain    Pain Radiating Towards  both legs    Pain Onset  More than a month ago    Pain Frequency  Constant    Aggravating Factors   any activity    Pain Relieving Factors  rest, sometimes heat    Multiple Pain Sites  No         [facility] PT Assessment - 10/29/18 0001      Assessment   Medical Diagnosis  Chronic LBP    Referring Provider (PT)   Loura Halt A, NP    Onset Date/Surgical Date  --   chornic pain but more over last 4 months   Next MD Visit  not scheduled      Precautions   Precautions  None      Balance Screen   Has the patient fallen in the past 6 months  Yes    How many times?  3    Has the patient had a decrease in activity level because of a fear of falling?   Yes    Is the patient reluctant to leave their home because of a fear of falling?   No      Home Film/video editor residence    Additional Comments  level entry, no stairs, her kids help with anything heavy      Prior Function   Level of Independence  Independent with basic ADLs    Vocation  Part time employment    Vocation Requirements  standing work pressing shirts    Leisure  reading, puzzles      Cognition   Overall Cognitive Status  Within Functional Limits for tasks assessed      Observation/Other Assessments   Focus on Therapeutic Outcomes (FOTO)   not done MCD      Sensation   Light Touch  Appears Intact      Coordination   Gross Motor Movements are Fluid and Coordinated  Yes      Posture/Postural Control   Posture Comments  increased lumbar lordosis with anterior pelvic tilt      AROM   AROM Assessment Site  Lumbar    Lumbar Flexion  50%    Lumbar Extension  50%    Lumbar - Right Side Bend  75%    Lumbar - Left Side Bend  75%    Lumbar - Right Rotation  75%    Lumbar - Left Rotation  75%      Strength   Overall Strength Comments  LE strength overall 4+/5 MMT grossly tested in sitting      Palpation   Palpation comment  very TTP in lumbar P.S. with muscle tightness and guarding      Special Tests   Other special tests  + slump on Rt, neg on Lt      Transfers   Transfers  Independent with all Transfers    Comments  cramping in her left lumbar thoracic with supine to  sit      Ambulation/Gait   Gait Comments  slower velocity with wider BOS                Objective measurements  completed on examination: See above findings.      [facility] Adult PT Treatment/Exercise - 10/29/18 0001      Modalities   Modalities  Electrical Stimulation;Moist Heat      Moist Heat Therapy   Number Minutes Moist Heat  10 Minutes    Moist Heat Location  Lumbar Spine      Electrical Stimulation   Electrical Stimulation Location  lumbar    Electrical Stimulation Action  IFC    Electrical Stimulation Parameters  to tolerance in sitting    Electrical Stimulation Goals  Pain             PT Education - 10/29/18 1939    Education Details  HEP, POC, TENS    Person(s) Educated  Patient    Methods  Explanation;Demonstration;Verbal cues;Handout    Comprehension  Verbalized understanding;Need further instruction          PT Long Term Goals - 10/29/18 1946      PT LONG TERM GOAL #1   Title  Pt will be I and compliant with HEP. Target for all goals is 4 weeks 11/26/18     Baseline  no HEP until today    Time  4    Period  Weeks    Status  New      PT LONG TERM GOAL #2   Title  Pt will improve lumbar ROM to [facility] at least 60% ROM avalable.     Baseline  less than 50%    Time  4    Period  Weeks    Status  New      PT LONG TERM GOAL #3   Title  Pt will report less than 3/10 overall pain with usual activity.     Baseline  8/10 pain with standing, work, and walking    Time  4    Period  Weeks    Status  New             Plan - 10/29/18 1940    Clinical Impression Statement  Pt presents with signs and symptoms consistent of acute on chronic LBP with bilat radiculopathy that has been worsened with 2 recent falls. She has overall poor posture with anterior pelvic tilt,decreased lumbar ROM, decreased strength, decreased activity tolerance particularly with standing or walking, and increased pain limiting her functional abilities. She will benefit from skilled PT to address her deficits.     Personal Factors and Comorbidities  Comorbidity 1;Comorbidity 2;Comorbidity  3+;Past/Current Experience;Time since onset of injury/illness/exacerbation    Comorbidities  PMH:DDD,CVA in 2012,NSTEMI 2017, CHF,HTN    Examination-Activity Limitations  Bend;Locomotion Level;Stand;Lift;Stairs;Squat    Examination-Participation Restrictions  Laundry;Shop;Community Activity;Driving;Cleaning    Stability/Clinical Decision Making  Evolving/Moderate complexity    Clinical Decision Making  Moderate    Rehab Potential  Fair    PT Frequency  1x / week    PT Duration  4 weeks    PT Treatment/Interventions  Cryotherapy;Electrical Stimulation;Moist Heat;Traction;Ultrasound;Gait Scientist, forensic;Therapeutic activities;Therapeutic exercise;Neuromuscular re-education;Manual techniques;Dry needling;Passive range of motion;Taping;Spinal Manipulations;Joint Manipulations    PT Next Visit Plan  review HEP, consider modalties and MT for pain    PT Home Exercise Plan  PPT,piriformis stretch, LTR, standing lumbar L stretch    Consulted and Agree with Plan of Care  Patient  Patient will benefit from skilled therapeutic intervention in order to improve the following deficits and impairments:  Decreased activity tolerance, Decreased endurance, Decreased range of motion, Decreased strength, Hypomobility, Increased muscle spasms, Pain, Postural dysfunction  Visit Diagnosis: Chronic bilateral low back pain with bilateral sciatica     Problem List Patient Active Problem List   Diagnosis Date Noted  . Degenerative disc disease at L5-S1 level 10/10/2018  . Abnormal thyroid blood test 03/15/2017  . Hypokalemia 07/23/2016  . Low TSH level 04/26/2016  . Abnormal uterine bleeding 04/06/2016  . Nonspecific chest pain   . Nonischemic dilated cardiomyopathy ([place]) 06/17/2015  . NSTEMI (non-ST elevated myocardial infarction) ([place]) - with normal cornaries on Cath 06/14/2015  . Systolic and diastolic CHF, acute on chronic ([place]) 06/14/2015  . H/O noncompliance with medical treatment,  presenting hazards to health   . Low back pain 09/16/2014  . Homelessness 09/16/2014  . Vitamin D deficiency 01/29/2014  . OSA (obstructive sleep apnea) 01/08/2014  . Chronic combined systolic and diastolic CHF (congestive heart failure) ([place]) 08/14/2013  . Essential hypertension   . Fibroids 08/09/2012  . Menorrhagia 05/08/2012  . Female pattern hair loss   . HLD (hyperlipidemia) 06/08/2010  . CEREBRAL ANEURYSM 06/08/2010  . 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 CEREBROVASCULAR ACCIDENT, HX OF 06/08/2010    Silvestre Mesi 10/29/2018, 7:51 PM  [facility] [address] [place], [place], [address] Phone: [phone]   Fax:  [phone]  Name: Chelsea Branch MRN: [id number] Date of Birth: 1966/05/12

## 2018-11-03 ENCOUNTER — Ambulatory Visit: Payer: Medicaid Other | Admitting: Physical Therapy

## 2018-11-10 ENCOUNTER — Ambulatory Visit: Payer: Medicaid Other | Admitting: Physical Therapy

## 2018-11-10 ENCOUNTER — Other Ambulatory Visit: Payer: Self-pay

## 2018-11-10 DIAGNOSIS — G8929 Other chronic pain: Secondary | ICD-10-CM

## 2018-11-10 DIAGNOSIS — M5441 Lumbago with sciatica, right side: Secondary | ICD-10-CM | POA: Diagnosis not present

## 2018-11-10 DIAGNOSIS — M5442 Lumbago with sciatica, left side: Secondary | ICD-10-CM | POA: Diagnosis not present

## 2018-11-10 NOTE — Therapy (Signed)
[place], [place], [address] Phone: [phone]   Fax:  [phone]  Physical Therapy Treatment  Patient Details  Name: Chelsea Branch MRN: [id number] Date of Birth: 1965/05/28 Referring Provider (PT): Orvan July, NP   Encounter Date: 11/10/2018  PT End of Session - 11/10/18 1135    Visit Number  2    Number of Visits  4    Date for PT Re-Evaluation  11/26/18    Authorization Type  MCD    Authorization - Visit Number  1    Authorization - Number of Visits  3    PT Start Time  0955    PT Stop Time  1045    PT Time Calculation (min)  50 min    Activity Tolerance  Patient tolerated treatment well    Behavior During Therapy  [facility] for tasks assessed/performed       Past Medical History:  Diagnosis Date  . Abnormal thyroid blood test 03/15/2017  . Abnormal uterine bleeding   . Alopecia   . Anemia   . Angina pectoris with normal coronary arteriogram ([place]) 2017   Had + Troponin c/w ? NSTEMI due to A on C CHF  . 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 Chronic combined systolic and diastolic CHF (congestive heart failure) ([place])   . DYSLIPIDEMIA   . Essential hypertension   . Fibroids   . H/O noncompliance with medical treatment, presenting hazards to health   . Hypertension   . Hypokalemia 07/23/2016  . Menorrhagia   . 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 iniatially ?2/2 peripartum in 1994 - improved by 2008 then worsening EF in 2011 back down to EF 25-30%. b. echo 01/21/14 showed mod LVH, [name]; [name], [name], [name],   . 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 NSTEMI (non-ST elevated myocardial infarction) ([place]) 05/2015   Normal Coronaries.  . Peripartum cardiomyopathy 1994  . Sleep apnea 2015   CPAP 12/2013  . Stroke [facility]) 2011  . Systolic and diastolic CHF, acute on chronic ([place]) 06/14/2015  . Tobacco abuse     Past Surgical History:   Procedure Laterality Date  . CARDIAC CATHETERIZATION N/A 06/16/2015   Procedure: Left Heart Cath and Coronary Angiography;  Surgeon: Jettie Booze, MD;  Location: [place] [facility];  Service: Cardiovascular;  Laterality: N/A;  . [place]  . LOOP RECORDER IMPLANT  ~ 2000  . TIBIAL TUBERCLERPLASTY    . TUBAL LIGATION  1994    There were no vitals filed for this visit.  Subjective Assessment - 11/10/18 1058    Subjective  She relays she had another fall in the shower last week when her back gave out on her causing her to fall on her buttocks. She says she has been in more back pain since and has tried to call MD about this but unable to get in touch with anyone.    Pertinent History  PMH:DDD,CVA in 2012,NSTEMI 2017, CHF,HTN    Limitations  Lifting;Sitting;Standing;Walking;Other (comment)   driving   How long can you sit comfortably?  20    How long can you stand comfortably?  10    How long can you walk comfortably?  15    Diagnostic tests  XR showing DDD    Patient Stated Goals  get the pain down, be able to walk better without pain    Currently in  Pain?  Yes    Pain Score  7     Pain Location  Back    Pain Orientation  Mid;Lower    Pain Descriptors / Indicators  Aching    Pain Type  Chronic pain    Pain Radiating Towards  denies radiculopathy into LE's today    Pain Onset  More than a month ago    Pain Frequency  Constant                       [facility] Adult PT Treatment/Exercise - 11/10/18 0001      Exercises   Exercises  Lumbar      Lumbar Exercises: Stretches   Single Knee to Chest Stretch  Right;Left;2 reps;30 seconds    Lower Trunk Rotation Limitations  3 sec X 10 each side    Piriformis Stretch  Right;Left;2 reps;30 seconds    Other Lumbar Stretch Exercise  standing L stretch 20 sec X 3      Lumbar Exercises: Aerobic   Nustep  7 min LE/UE L3 with heat      Lumbar Exercises: Supine   Ab Set  15 reps;5 seconds    Pelvic Tilt   20 reps    Clam Limitations  with TAC, red X 15    Bent Knee Raise Limitations  with TAC X 10 bilat      Modalities   Modalities  Electrical Stimulation;Moist Heat      Moist Heat Therapy   Number Minutes Moist Heat  15 Minutes    Moist Heat Location  Lumbar Spine      Electrical Stimulation   Electrical Stimulation Location  lumbar    Electrical Stimulation Action  IFC    Electrical Stimulation Parameters  tolerance    Electrical Stimulation Goals  Pain      Manual Therapy   Manual therapy comments  manual hip/lumbar stretching, and and LAD mobilizaitons             PT Education - 11/10/18 1108    Education Details  HEP review, general pain science education and need for short but frequent gentle exercise in pain free ROM    Person(s) Educated  Patient          PT Long Term Goals - 10/29/18 1946      PT LONG TERM GOAL #1   Title  Pt will be I and compliant with HEP. Target for all goals is 4 weeks 11/26/18     Baseline  no HEP until today    Time  4    Period  Weeks    Status  New      PT LONG TERM GOAL #2   Title  Pt will improve lumbar ROM to [facility] at least 60% ROM avalable.     Baseline  less than 50%    Time  4    Period  Weeks    Status  New      PT LONG TERM GOAL #3   Title  Pt will report less than 3/10 overall pain with usual activity.     Baseline  8/10 pain with standing, work, and walking    Time  4    Period  Weeks    Status  New            Plan - 11/10/18 1136    Clinical Impression Statement  Pt reported she had fall last week but does not appear to be in  any distress or injury from this. She does continue to have chronic LBP, spasm, and tightness but denies radiculopathy today. She was taken through her HEP and she showed good undestanding and retrurn demonstration with this although admits to not doing them regularly yet at home. She was shown more core stabilization exercises today as well and had good tolerance to these. MT trialed  for LAD for spinal decompression and pain reduction. PT will continue to progress as able toward her functional goals.    Personal Factors and Comorbidities  Comorbidity 1;Comorbidity 2;Comorbidity 3+;Past/Current Experience;Time since onset of injury/illness/exacerbation    Comorbidities  PMH:DDD,CVA in 2012,NSTEMI 2017, CHF,HTN    Examination-Activity Limitations  Bend;Locomotion Level;Stand;Lift;Stairs;Squat    Examination-Participation Restrictions  Laundry;Shop;Community Activity;Driving;Cleaning    Stability/Clinical Decision Making  Evolving/Moderate complexity    Rehab Potential  Fair    PT Frequency  1x / week    PT Duration  4 weeks    PT Treatment/Interventions  Cryotherapy;Electrical Stimulation;Moist Heat;Traction;Ultrasound;Gait Scientist, forensic;Therapeutic activities;Therapeutic exercise;Neuromuscular re-education;Manual techniques;Dry needling;Passive range of motion;Taping;Spinal Manipulations;Joint Manipulations    PT Next Visit Plan  review HEP, consider modalties and MT for pain    PT Home Exercise Plan  PPT,piriformis stretch, LTR, standing lumbar L stretch    Consulted and Agree with Plan of Care  Patient       Patient will benefit from skilled therapeutic intervention in order to improve the following deficits and impairments:  Decreased activity tolerance, Decreased endurance, Decreased range of motion, Decreased strength, Hypomobility, Increased muscle spasms, Pain, Postural dysfunction  Visit Diagnosis: 1. Chronic bilateral low back pain with bilateral sciatica        Problem List Patient Active Problem List   Diagnosis Date Noted  . Degenerative disc disease at L5-S1 level 10/10/2018  . Abnormal thyroid blood test 03/15/2017  . Hypokalemia 07/23/2016  . Low TSH level 04/26/2016  . Abnormal uterine bleeding 04/06/2016  . Nonspecific chest pain   . 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 NSTEMI (non-ST elevated myocardial infarction)  ([place]) - with normal cornaries on Cath 06/14/2015  . Systolic and diastolic CHF, acute on chronic ([place]) 06/14/2015  . H/O noncompliance with medical treatment, presenting hazards to health   . Low back pain 09/16/2014  . Homelessness 09/16/2014  . Vitamin D deficiency 01/29/2014  . OSA (obstructive sleep apnea) 01/08/2014  . Chronic combined systolic and diastolic CHF (congestive heart failure) ([place]) 08/14/2013  . Essential hypertension   . Fibroids 08/09/2012  . Menorrhagia 05/08/2012  . Female pattern hair loss   . HLD (hyperlipidemia) 06/08/2010  . CEREBRAL ANEURYSM 06/08/2010  . 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 CEREBROVASCULAR ACCIDENT, HX OF 06/08/2010    Silvestre Mesi 11/10/2018, 11:41 AM  [facility] [address] [place], [place], [address] Phone: [phone]   Fax:  [phone]  Name: Chelsea Branch MRN: [id number] Date of Birth: 01-Nov-1965

## 2018-11-17 ENCOUNTER — Ambulatory Visit: Payer: Medicaid Other | Admitting: Physical Therapy

## 2018-11-17 ENCOUNTER — Other Ambulatory Visit: Payer: Self-pay

## 2018-11-17 DIAGNOSIS — M5442 Lumbago with sciatica, left side: Secondary | ICD-10-CM | POA: Diagnosis not present

## 2018-11-17 DIAGNOSIS — G8929 Other chronic pain: Secondary | ICD-10-CM

## 2018-11-17 DIAGNOSIS — M5441 Lumbago with sciatica, right side: Secondary | ICD-10-CM | POA: Diagnosis not present

## 2018-11-19 ENCOUNTER — Encounter: Payer: Self-pay | Admitting: Physical Therapy

## 2018-11-19 NOTE — Therapy (Signed)
[place], [place], [address] Phone: [phone]   Fax:  [phone]  Physical Therapy Treatment  Patient Details  Name: Chelsea Branch MRN: [id number] Date of Birth: 1966-03-22 Referring Provider (PT): Orvan July, NP   Encounter Date: 11/17/2018  PT End of Session - 11/19/18 1220    Visit Number  3    Number of Visits  4    Date for PT Re-Evaluation  11/26/18    Authorization Type  MCD    Authorization - Visit Number  2    Authorization - Number of Visits  3    PT Start Time  1100    PT Stop Time  1145    PT Time Calculation (min)  45 min    Activity Tolerance  Patient tolerated treatment well    Behavior During Therapy  [facility] for tasks assessed/performed       Past Medical History:  Diagnosis Date  . Abnormal thyroid blood test 03/15/2017  . Abnormal uterine bleeding   . Alopecia   . Anemia   . Angina pectoris with normal coronary arteriogram ([place]) 2017   Had + Troponin c/w ? NSTEMI due to A on C CHF  . 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 Chronic combined systolic and diastolic CHF (congestive heart failure) ([place])   . DYSLIPIDEMIA   . Essential hypertension   . Fibroids   . H/O noncompliance with medical treatment, presenting hazards to health   . Hypertension   . Hypokalemia 07/23/2016  . Menorrhagia   . Nonischemic cardiomyopathy ([place]) 1994; 2017   a. iniatially ?2/2 peripartum in 1994 - improved by 2008 then worsening EF in 2011 back down to EF 25-30%. b. echo 01/21/14 showed mod LVH, [name]; [name], [name], [name],   . 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 NSTEMI (non-ST elevated myocardial infarction) ([place]) 05/2015   Normal Coronaries.  . Peripartum cardiomyopathy 1994  . Sleep apnea 2015   CPAP 12/2013  . Stroke [facility]) 2011  . Systolic and diastolic CHF, acute on chronic ([place]) 06/14/2015  . Tobacco abuse     Past Surgical History:   Procedure Laterality Date  . CARDIAC CATHETERIZATION N/A 06/16/2015   Procedure: Left Heart Cath and Coronary Angiography;  Surgeon: Jettie Booze, MD;  Location: [place] CV [facility];  Service: Cardiovascular;  Laterality: N/A;  . [place]  . LOOP RECORDER IMPLANT  ~ 2000  . TIBIAL TUBERCLERPLASTY    . TUBAL LIGATION  1994    There were no vitals filed for this visit.  Subjective Assessment - 11/19/18 1217    Subjective  she says she thinks she may need cane due to falls. She says her back is feeling a little better    Pertinent History  PMH:DDD,CVA in 2012,NSTEMI 2017, CHF,HTN    Limitations  Lifting;Sitting;Standing;Walking;Other (comment)   driving   How long can you sit comfortably?  20    How long can you stand comfortably?  10    How long can you walk comfortably?  15    Diagnostic tests  XR showing DDD    Patient Stated Goals  get the pain down, be able to walk better without pain    Currently in Pain?  Yes    Pain Score  7     Pain Location  Back    Pain Orientation  Right;Left;Lower    Pain Descriptors /  Indicators  Aching;Sharp    Pain Type  Chronic pain    Pain Onset  More than a month ago                       [facility] Adult PT Treatment/Exercise - 11/19/18 0001      Ambulation/Gait   Gait Comments  gait training with SPC, she could not perform reciprocal step through pattern after several attemps, cuing, demonstraction. Will try again next session      Lumbar Exercises: Stretches   Single Knee to Chest Stretch  Right;Left;2 reps;30 seconds    Lower Trunk Rotation Limitations  3 sec X 10 each side    Piriformis Stretch  Right;Left;2 reps;30 seconds    Other Lumbar Stretch Exercise  standing L stretch 20 sec X 3      Lumbar Exercises: Aerobic   Nustep  6 min UE/LE with heat      Lumbar Exercises: Standing   Row  15 reps    Theraband Level (Row)  Level 1 (Yellow)      Lumbar Exercises: Supine   Pelvic Tilt  20 reps     Clam Limitations  with TAC, red X 15    Bent Knee Raise Limitations  with TAC X 10 bilat      Moist Heat Therapy   Number Minutes Moist Heat  15 Minutes    Moist Heat Location  Lumbar Spine      Electrical Stimulation   Electrical Stimulation Location  lumbar    Electrical Stimulation Action  IFC    Electrical Stimulation Parameters  tolerance, pt sitting    Electrical Stimulation Goals  Pain                  PT Long Term Goals - 10/29/18 1946      PT LONG TERM GOAL #1   Title  Pt will be I and compliant with HEP. Target for all goals is 4 weeks 11/26/18     Baseline  no HEP until today    Time  4    Period  Weeks    Status  New      PT LONG TERM GOAL #2   Title  Pt will improve lumbar ROM to [facility] at least 60% ROM avalable.     Baseline  less than 50%    Time  4    Period  Weeks    Status  New      PT LONG TERM GOAL #3   Title  Pt will report less than 3/10 overall pain with usual activity.     Baseline  8/10 pain with standing, work, and walking    Time  4    Period  Weeks    Status  New            Plan - 11/19/18 1221    Clinical Impression Statement  Pt appeared to have some improvements in activity tolerance with her therex. Gait training performed today with trial of SPC however she could not show return demonstration of appropriate reciprocal step through pattern. PT will continue to try to work on this as she has had recent falls due to "leg giving out on her"    Personal Factors and Comorbidities  Comorbidity 1;Comorbidity 2;Comorbidity 3+;Past/Current Experience;Time since onset of injury/illness/exacerbation    Comorbidities  PMH:DDD,CVA in 2012,NSTEMI 2017, CHF,HTN    Examination-Activity Limitations  Bend;Locomotion Level;Stand;Lift;Stairs;Squat    Examination-Participation Restrictions  Laundry;Shop;Community Activity;Driving;Cleaning  Stability/Clinical Decision Making  Evolving/Moderate complexity    Rehab Potential  Fair    PT Frequency   1x / week    PT Duration  4 weeks    PT Treatment/Interventions  Cryotherapy;Electrical Stimulation;Moist Heat;Traction;Ultrasound;Gait Scientist, forensic;Therapeutic activities;Therapeutic exercise;Neuromuscular re-education;Manual techniques;Dry needling;Passive range of motion;Taping;Spinal Manipulations;Joint Manipulations    PT Next Visit Plan  continue gait training with SPC, consider modalties and MT for pain    PT Home Exercise Plan  PPT,piriformis stretch, LTR, standing lumbar L stretch    Consulted and Agree with Plan of Care  Patient       Patient will benefit from skilled therapeutic intervention in order to improve the following deficits and impairments:  Decreased activity tolerance, Decreased endurance, Decreased range of motion, Decreased strength, Hypomobility, Increased muscle spasms, Pain, Postural dysfunction  Visit Diagnosis: 1. Chronic bilateral low back pain with bilateral sciatica        Problem List Patient Active Problem List   Diagnosis Date Noted  . Degenerative disc disease at L5-S1 level 10/10/2018  . Abnormal thyroid blood test 03/15/2017  . Hypokalemia 07/23/2016  . Low TSH level 04/26/2016  . Abnormal uterine bleeding 04/06/2016  . Nonspecific chest pain   . 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 NSTEMI (non-ST elevated myocardial infarction) ([place]) - with normal cornaries on Cath 06/14/2015  . Systolic and diastolic CHF, acute on chronic ([place]) 06/14/2015  . H/O noncompliance with medical treatment, presenting hazards to health   . Low back pain 09/16/2014  . Homelessness 09/16/2014  . Vitamin D deficiency 01/29/2014  . OSA (obstructive sleep apnea) 01/08/2014  . Chronic combined systolic and diastolic CHF (congestive heart failure) ([place]) 08/14/2013  . Essential hypertension   . Fibroids 08/09/2012  . Menorrhagia 05/08/2012  . Female pattern hair loss   . HLD (hyperlipidemia) 06/08/2010  . CEREBRAL ANEURYSM 06/08/2010  .  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 CEREBROVASCULAR ACCIDENT, HX OF 06/08/2010    Silvestre Mesi 11/19/2018, 12:26 PM  [facility] [address] [place], [place], [address] Phone: [phone]   Fax:  [phone]  Name: Chelsea Branch MRN: [id number] Date of Birth: 1966/01/31

## 2018-11-22 ENCOUNTER — Telehealth: Payer: Self-pay

## 2018-11-22 NOTE — Telephone Encounter (Signed)
PA approved, pharmacy notified.

## 2018-11-22 NOTE — Telephone Encounter (Signed)
Patient would like to get Volteran gel prescription for medicaid to cover.

## 2018-11-22 NOTE — Telephone Encounter (Signed)
Case Manager Rayburn Ma sent a fax requesting a single point cane with adjustable height and a shower chair and transfer bench.  Fax to [employer] [phone]

## 2018-11-23 ENCOUNTER — Telehealth: Payer: Self-pay | Admitting: Internal Medicine

## 2018-11-23 MED ORDER — MISC. DEVICES MISC: 0 refills | Status: DC

## 2018-11-23 NOTE — Telephone Encounter (Signed)
Order has been faxed over to [facility].

## 2018-11-23 NOTE — Addendum Note (Signed)
Addended by: Charlott Rakes on: 11/23/2018 09:44 AM   Modules accepted: Orders

## 2018-11-23 NOTE — Telephone Encounter (Signed)
Done. Please fax. Thank you

## 2018-11-23 NOTE — Telephone Encounter (Signed)
Tye Maryland would like to speak to nurse about patient's medications.

## 2018-11-27 NOTE — Telephone Encounter (Signed)
Spoke with Tye Maryland and confirmed dosages for Toprol and torsemide. She states pt is compliant and she wanted to make sure they were the right doses.

## 2018-11-28 DIAGNOSIS — I0981 Rheumatic heart failure: Secondary | ICD-10-CM | POA: Diagnosis not present

## 2018-12-07 ENCOUNTER — Other Ambulatory Visit: Payer: Self-pay

## 2018-12-07 ENCOUNTER — Encounter: Payer: Self-pay | Admitting: Family Medicine

## 2018-12-07 ENCOUNTER — Ambulatory Visit: Payer: Medicaid Other | Attending: Family Medicine | Admitting: Family Medicine

## 2018-12-07 VITALS — BP 110/65 | HR 79 | Ht 68.0 in | Wt 229.4 lb

## 2018-12-07 DIAGNOSIS — I420 Dilated cardiomyopathy: Secondary | ICD-10-CM | POA: Diagnosis not present

## 2018-12-07 DIAGNOSIS — Z8673 Personal history of transient ischemic attack (TIA), and cerebral infarction without residual deficits: Secondary | ICD-10-CM | POA: Diagnosis not present

## 2018-12-07 DIAGNOSIS — G4730 Sleep apnea, unspecified: Secondary | ICD-10-CM | POA: Diagnosis not present

## 2018-12-07 DIAGNOSIS — M5441 Lumbago with sciatica, right side: Secondary | ICD-10-CM | POA: Diagnosis not present

## 2018-12-07 DIAGNOSIS — Z791 Long term (current) use of non-steroidal anti-inflammatories (NSAID): Secondary | ICD-10-CM | POA: Insufficient documentation

## 2018-12-07 DIAGNOSIS — I5042 Chronic combined systolic (congestive) and diastolic (congestive) heart failure: Secondary | ICD-10-CM | POA: Diagnosis not present

## 2018-12-07 DIAGNOSIS — Z7982 Long term (current) use of aspirin: Secondary | ICD-10-CM | POA: Insufficient documentation

## 2018-12-07 DIAGNOSIS — Z8249 Family history of ischemic heart disease and other diseases of the circulatory system: Secondary | ICD-10-CM | POA: Insufficient documentation

## 2018-12-07 DIAGNOSIS — I110 Hypertensive heart disease with heart failure: Secondary | ICD-10-CM | POA: Insufficient documentation

## 2018-12-07 DIAGNOSIS — I252 Old myocardial infarction: Secondary | ICD-10-CM | POA: Diagnosis not present

## 2018-12-07 DIAGNOSIS — R519 Headache, unspecified: Secondary | ICD-10-CM

## 2018-12-07 DIAGNOSIS — R51 Headache: Secondary | ICD-10-CM | POA: Diagnosis not present

## 2018-12-07 DIAGNOSIS — R42 Dizziness and giddiness: Secondary | ICD-10-CM | POA: Diagnosis not present

## 2018-12-07 DIAGNOSIS — Z79899 Other long term (current) drug therapy: Secondary | ICD-10-CM | POA: Diagnosis not present

## 2018-12-07 DIAGNOSIS — M5442 Lumbago with sciatica, left side: Secondary | ICD-10-CM | POA: Diagnosis not present

## 2018-12-07 DIAGNOSIS — E785 Hyperlipidemia, unspecified: Secondary | ICD-10-CM | POA: Insufficient documentation

## 2018-12-07 DIAGNOSIS — L659 Nonscarring hair loss, unspecified: Secondary | ICD-10-CM | POA: Diagnosis not present

## 2018-12-07 DIAGNOSIS — G8929 Other chronic pain: Secondary | ICD-10-CM | POA: Insufficient documentation

## 2018-12-07 DIAGNOSIS — M545 Low back pain: Secondary | ICD-10-CM | POA: Diagnosis present

## 2018-12-07 MED ORDER — CYCLOBENZAPRINE HCL 10 MG PO TABS: 10.0000 mg | ORAL_TABLET | Freq: Two times a day (BID) | ORAL | 3 refills | Status: DC | PRN

## 2018-12-07 MED ORDER — GABAPENTIN 300 MG PO CAPS: 300.0000 mg | ORAL_CAPSULE | Freq: Every day | ORAL | 3 refills | Status: DC

## 2018-12-07 MED ORDER — DULOXETINE HCL 30 MG PO CPEP: 30.0000 mg | ORAL_CAPSULE | Freq: Every day | ORAL | 3 refills | Status: DC

## 2018-12-07 MED ORDER — ACETAMINOPHEN-CODEINE #3 300-30 MG PO TABS: 1.0000 | ORAL_TABLET | Freq: Two times a day (BID) | ORAL | 0 refills | Status: DC | PRN

## 2018-12-07 MED ORDER — MECLIZINE HCL 25 MG PO TABS: 25.0000 mg | ORAL_TABLET | Freq: Two times a day (BID) | ORAL | 1 refills | Status: DC | PRN

## 2018-12-07 NOTE — Progress Notes (Signed)
Subjective:  [name], female    DOB: [birth year]  Age: 53 y.o. MRN: [id number]  CC: Back Pain   HPI Chelsea Branch Venus Gilles is a 53 year old female with a history of CHF (EF 60 to 65% from 06/2018 ), hypertension who presents today for follow-up of low back pain. She had a few sessions of physical therapy which were beneficial after which sessions were discontinued.  Her back pain persists and radiates down her legs with intermittent numbness.  She almost fell while in the bathtub the other day.  Pain is described as moderate to severe.  Wondering if she can receive a back brace. She also endorses intermittent headaches which are frontal and not associated with blurry vision, nausea or vomiting. She has noticed dizziness for the last 3 days with associated spinning of the room and she has to hold on for support whenever she stands. Requesting a note to be out of work till 12/11/2018.  Past Medical History:  Diagnosis Date  . Abnormal thyroid blood test 03/15/2017  . Abnormal uterine bleeding   . Alopecia   . Anemia   . Angina pectoris with normal coronary arteriogram ([place]) 2017   Had + Troponin c/w ? NSTEMI due to A on C CHF  . 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 Chronic combined systolic and diastolic CHF (congestive heart failure) ([place])   . DYSLIPIDEMIA   . Essential hypertension   . Fibroids   . H/O noncompliance with medical treatment, presenting hazards to health   . Hypertension   . Hypokalemia 07/23/2016  . Menorrhagia   . 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 iniatially ?2/2 peripartum in 1994 - improved by 2008 then worsening EF in 2011 back down to EF 25-30%. b. echo 01/21/14 showed mod LVH, [name]; [name], [name], [name],   . 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 NSTEMI (non-ST elevated myocardial infarction) ([place]) 05/2015   Normal Coronaries.  . Peripartum cardiomyopathy 1994  . Sleep apnea 2015    CPAP 12/2013  . Stroke [facility]) 2011  . Systolic and diastolic CHF, acute on chronic ([place]) 06/14/2015  . Tobacco abuse     Past Surgical History:  Procedure Laterality Date  . CARDIAC CATHETERIZATION N/A 06/16/2015   Procedure: Left Heart Cath and Coronary Angiography;  Surgeon: Jettie Booze, MD;  Location: [place] CV [facility];  Service: Cardiovascular;  Laterality: N/A;  . [place]  . LOOP RECORDER IMPLANT  ~ 2000  . TIBIAL TUBERCLERPLASTY    . TUBAL LIGATION  1994    Family History  Problem Relation Age of Onset  . Cancer Maternal Grandmother        uterine  . Hypertension Sister   . Other Neg Hx   . Heart disease Neg Hx     Allergies  Allergen Reactions  . Ace Inhibitors Other (See Comments)    REACTION: Cough    Outpatient Medications Prior to Visit  Medication Sig Dispense Refill  . acetaminophen (TYLENOL) 500 MG tablet Take 1 tablet (500 mg total) by mouth every 6 (six) hours as needed. 30 tablet 0  . aspirin 81 MG EC tablet Take 1 tablet (81 mg total) by mouth daily. 30 tablet 0  . Biotin 10000 MCG TABS Take 1 tablet by mouth daily. 30 tablet 11  . diclofenac sodium (VOLTAREN) 1 % GEL Apply 4 g topically 4 (four) times daily. 100 g 1  .  ENTRESTO 97-103 MG TAKE 1 TABLET BY MOUTH TWICE A DAY 60 tablet 11  . famotidine (PEPCID) 20 MG tablet Take 1 tablet (20 mg total) 2 (two) times daily by mouth. 6 tablet 0  . KLOR-CON M20 20 MEQ tablet TAKE 2 TABLETS (40 MEQ TOTAL) BY MOUTH 2 (TWO) TIMES DAILY. 120 tablet 6  . metoprolol succinate (TOPROL-XL) 50 MG 24 hr tablet Take 75 mg 1 and a half tablets daily 90 tablet 3  . Misc. Devices MISC Please provide patient with insurance approved straight cane 1 each 0  . Misc. Devices MISC Single-point cane with adjustable height x1, shower chair x1, transfer bench x1.  Diagnosis congestive heart failure, degenerative disc disease of the lumbar spine. 1 each 0  . simvastatin (ZOCOR) 10 MG tablet TAKE 1 TABLET BY  MOUTH EVERY DAY IN THE EVENING 30 tablet 11  . spironolactone (ALDACTONE) 25 MG tablet TAKE 0.5 TABLETS (12.5 MG TOTAL) BY MOUTH DAILY. 15 tablet 11  . torsemide (DEMADEX) 20 MG tablet Take 2 tablets (40 mg total) by mouth daily. 180 tablet 3  . cyclobenzaprine (FLEXERIL) 10 MG tablet Take 1 tablet (10 mg total) by mouth 2 (two) times daily as needed for muscle spasms. 60 tablet 3  . gabapentin (NEURONTIN) 300 MG capsule Take 1 capsule (300 mg total) by mouth at bedtime. 30 capsule 3  . DULoxetine (CYMBALTA) 30 MG capsule Take 1 capsule (30 mg total) by mouth daily for 30 days. 30 capsule 3   No facility-administered medications prior to visit.      ROS Review of Systems  Constitutional: Negative for activity change, appetite change and fatigue.  HENT: Negative for congestion, sinus pressure and sore throat.   Eyes: Negative for visual disturbance.  Respiratory: Negative for cough, chest tightness, shortness of breath and wheezing.   Cardiovascular: Negative for chest pain and palpitations.  Gastrointestinal: Negative for abdominal distention, abdominal pain and constipation.  Endocrine: Negative for polydipsia.  Genitourinary: Negative for dysuria and frequency.  Musculoskeletal: Positive for back pain. Negative for arthralgias.  Skin: Negative for rash.  Neurological: Positive for headaches. Negative for tremors, light-headedness and numbness.  Hematological: Does not bruise/bleed easily.  Psychiatric/Behavioral: Negative for agitation and behavioral problems.    Objective:  BP 110/65   Pulse 79   Ht 5\' 8"  (1.727 m)   Wt 229 lb 6.4 oz (104.1 kg)   SpO2 98%   BMI 34.88 kg/m   BP/Weight 12/07/2018 10/19/2018 1/61/0960  Systolic BP 454 97 098  Diastolic BP 65 64 84  Wt. (Lbs) 229.4 200 210  BMI 34.88 30.41 31.93      Physical Exam Constitutional:      Appearance: She is well-developed.  Cardiovascular:     Rate and Rhythm: Normal rate.     Heart sounds: Normal heart  sounds. No murmur.  Pulmonary:     Effort: Pulmonary effort is normal.     Breath sounds: Normal breath sounds. No wheezing or rales.  Chest:     Chest wall: No tenderness.  Abdominal:     General: Bowel sounds are normal. There is no distension.     Palpations: Abdomen is soft. There is no mass.     Tenderness: There is no abdominal tenderness.  Musculoskeletal:        General: Tenderness (TTP  across lumbar spine) present.     Comments: Negative straight leg raise bilaterally  Neurological:     Mental Status: She is alert and oriented to person,  place, and time.     CMP Latest Ref Rng & Units 06/26/2018 05/24/2018 12/16/2017  Glucose 65 - 99 mg/dL 91 146(H) 109(H)  BUN 6 - 24 mg/dL 10 7 6   Creatinine 0.57 - 1.00 mg/dL 0.81 0.88 0.79  Sodium 134 - 144 mmol/L 143 141 143  Potassium 3.5 - 5.2 mmol/L 3.2(L) 3.3(L) 3.0(L)  Chloride 96 - 106 mmol/L 101 107 101  CO2 20 - 29 mmol/L 24 23 24   Calcium 8.7 - 10.2 mg/dL 9.3 8.8(L) 9.2  Total Protein 6.1 - 8.1 g/dL - - -  Total Bilirubin 0.2 - 1.2 mg/dL - - -  Alkaline Phos 33 - 130 U/L - - -  AST 10 - 35 U/L - - -  ALT 6 - 29 U/L - - -    Lipid Panel     Component Value Date/Time   CHOL 135 06/15/2015 0450   TRIG 53 06/15/2015 0450   HDL 35 (L) 06/15/2015 0450   CHOLHDL 3.9 06/15/2015 0450   VLDL 11 06/15/2015 0450   LDLCALC 89 06/15/2015 0450    CBC    Component Value Date/Time   WBC 7.0 06/26/2018 1238   WBC 7.5 05/24/2018 0848   RBC 5.39 (H) 06/26/2018 1238   RBC 5.42 (H) 05/24/2018 0848   HGB 15.4 06/26/2018 1238   HCT 46.0 06/26/2018 1238   PLT 264 06/26/2018 1238   MCV 85 06/26/2018 1238   MCH 28.6 06/26/2018 1238   MCH 28.8 05/24/2018 0848   MCHC 33.5 06/26/2018 1238   MCHC 32.8 05/24/2018 0848   RDW 13.1 06/26/2018 1238   LYMPHSABS 2.2 11/02/2014 1529   MONOABS 0.3 11/02/2014 1529   EOSABS 0.1 11/02/2014 1529   BASOSABS 0.0 11/02/2014 1529    Lab Results  Component Value Date   HGBA1C 5.8 (H) 06/14/2015     Assessment & Plan:   1. Vertigo Counseled on changing positions slowly - meclizine (ANTIVERT) 25 MG tablet; Take 1 tablet (25 mg total) by mouth 2 (two) times daily as needed for dizziness.  Dispense: 60 tablet; Refill: 1  2. Chronic bilateral low back pain with bilateral sciatica Uncontrolled Tylenol #3 added to regimen Back brace to be discussed at orthopedic or PT visit. - Ambulatory referral to Physical Therapy - Basic Metabolic Panel - cyclobenzaprine (FLEXERIL) 10 MG tablet; Take 1 tablet (10 mg total) by mouth 2 (two) times daily as needed for muscle spasms.  Dispense: 60 tablet; Refill: 3 - DULoxetine (CYMBALTA) 30 MG capsule; Take 1 capsule (30 mg total) by mouth daily.  Dispense: 30 capsule; Refill: 3 - gabapentin (NEURONTIN) 300 MG capsule; Take 1 capsule (300 mg total) by mouth at bedtime.  Dispense: 30 capsule; Refill: 3 - Ambulatory referral to Spine Surgery  3. Acute nonintractable headache, unspecified headache type LikelyTension headache Controlled on Tylenol   Health Care Maintenance: Address physical at next visit  Meds ordered this encounter  Medications  . cyclobenzaprine (FLEXERIL) 10 MG tablet    Sig: Take 1 tablet (10 mg total) by mouth 2 (two) times daily as needed for muscle spasms.    Dispense:  60 tablet    Refill:  3  . DULoxetine (CYMBALTA) 30 MG capsule    Sig: Take 1 capsule (30 mg total) by mouth daily.    Dispense:  30 capsule    Refill:  3  . gabapentin (NEURONTIN) 300 MG capsule    Sig: Take 1 capsule (300 mg total) by mouth at bedtime.  Dispense:  30 capsule    Refill:  3  . meclizine (ANTIVERT) 25 MG tablet    Sig: Take 1 tablet (25 mg total) by mouth 2 (two) times daily as needed for dizziness.    Dispense:  60 tablet    Refill:  1    Follow-up: Return in about 3 months (around 03/09/2019) for medical conditions.       Charlott Rakes, MD, FAAFP. [facility], [place]   12/07/2018, 11:27 AM

## 2018-12-07 NOTE — Patient Instructions (Signed)

## 2018-12-07 NOTE — Progress Notes (Signed)
Patient is having pain in lower back and legs.  Patient has been having headaches.

## 2018-12-08 ENCOUNTER — Telehealth: Payer: Self-pay

## 2018-12-08 ENCOUNTER — Ambulatory Visit: Payer: Medicaid Other | Admitting: Physical Therapy

## 2018-12-08 LAB — BASIC METABOLIC PANEL
BUN/Creatinine Ratio: 17 (ref 9–23)
BUN: 13 mg/dL (ref 6–24)
CO2: 27 mmol/L (ref 20–29)
Calcium: 9.3 mg/dL (ref 8.7–10.2)
Chloride: 100 mmol/L (ref 96–106)
Creatinine, Ser: 0.75 mg/dL (ref 0.57–1.00)
GFR calc Af Amer: 105 mL/min/{1.73_m2} (ref >59)
GFR calc non Af Amer: 91 mL/min/{1.73_m2} (ref >59)
Glucose: 95 mg/dL (ref 65–99)
Potassium: 3.5 mmol/L (ref 3.5–5.2)
Sodium: 142 mmol/L (ref 134–144)

## 2018-12-08 MED ORDER — IBUPROFEN 800 MG PO TABS: 800.0000 mg | ORAL_TABLET | Freq: Two times a day (BID) | ORAL | 0 refills | Status: DC | PRN

## 2018-12-08 NOTE — Telephone Encounter (Signed)
Done

## 2018-12-08 NOTE — Telephone Encounter (Signed)
-----   Message from Charlott Rakes, MD sent at 12/08/2018 10:55 AM EDT ----- Please inform the patient that labs are normal. Thank you.

## 2018-12-08 NOTE — Telephone Encounter (Signed)
Patient was called and informed of lab results. 

## 2018-12-08 NOTE — Telephone Encounter (Signed)
Patient is having pain in sides and is requesting Ibuprofen 800 sent to [facility].

## 2018-12-09 ENCOUNTER — Ambulatory Visit (HOSPITAL_COMMUNITY)
Admission: EM | Admit: 2018-12-09 | Discharge: 2018-12-09 | Disposition: A | Discharge status: Home / Self Care | Payer: Medicaid Other | Attending: Emergency Medicine | Admitting: Emergency Medicine

## 2018-12-09 ENCOUNTER — Encounter (HOSPITAL_COMMUNITY): Payer: Self-pay

## 2018-12-09 ENCOUNTER — Other Ambulatory Visit: Payer: Self-pay

## 2018-12-09 DIAGNOSIS — G8929 Other chronic pain: Secondary | ICD-10-CM

## 2018-12-09 DIAGNOSIS — M5441 Lumbago with sciatica, right side: Secondary | ICD-10-CM

## 2018-12-09 MED ORDER — METHYLPREDNISOLONE ACETATE 40 MG/ML IJ SUSP
40.0000 mg | Freq: Once | INTRAMUSCULAR | Status: AC
  Administered 2018-12-09: 40 mg via INTRAMUSCULAR

## 2018-12-09 MED ORDER — METHYLPREDNISOLONE ACETATE 40 MG/ML IJ SUSP
INTRAMUSCULAR | Status: AC
  Filled 2018-12-09: qty 1

## 2018-12-09 NOTE — ED Triage Notes (Signed)
Pt C/O right side back pain. Symptom started two days ago. Pt states it hurts when she walk

## 2018-12-09 NOTE — Discharge Instructions (Addendum)
Take the medications as prescribed by your primary care provider.  You were given a steroid injection today.    Follow-up with your primary care provider next week.  Continue physical therapy as ordered.  Follow-up with the orthopedic as directed by your primary care provider.

## 2018-12-09 NOTE — ED Provider Notes (Signed)
[facility]    CSN: [id number] Arrival date & time: 12/09/18  1000     History   Chief Complaint Chief Complaint  Patient presents with  . Back Pain    HPI Chelsea Branch is a 53 y.o. female.   Patient presents with right side back pain radiating down her leg on the side; her pain is chronic and she has been diagnosed with degenerative disc disease and chronic back pain.  She was seen by her primary care provider yesterday and prescribed Voltaren gel, Flexeril, gabapentin, ibuprofen; and she has a prescription for Tylenol with codeine that was prescribed on 12/07/2018.  Patient states her back pain is ongoing, is worse with walking.  She has been referred by her PCP to Ortho and she is currently in physical therapy although she did not go yesterday due to pain.  She denies any new falls or injury.  The history is provided by the patient.    Past Medical History:  Diagnosis Date  . Abnormal thyroid blood test 03/15/2017  . Abnormal uterine bleeding   . Alopecia   . Anemia   . Angina pectoris with normal coronary arteriogram ([place]) 2017   Had + Troponin c/w ? NSTEMI due to A on C CHF  . 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 Chronic combined systolic and diastolic CHF (congestive heart failure) ([place])   . DYSLIPIDEMIA   . Essential hypertension   . Fibroids   . H/O noncompliance with medical treatment, presenting hazards to health   . Hypertension   . Hypokalemia 07/23/2016  . Menorrhagia   . Nonischemic cardiomyopathy ([place]) 1994; 2017   a. iniatially ?2/2 peripartum in 1994 - improved by 2008 then worsening EF in 2011 back down to EF 25-30%. b. echo 01/21/14 showed mod LVH, [name]; [name], [name], [name],   . 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 NSTEMI (non-ST elevated myocardial infarction) ([place]) 05/2015   Normal Coronaries.  . Peripartum cardiomyopathy 1994  . Sleep apnea 2015   CPAP 12/2013  . Stroke [facility]) 2011  .  Systolic and diastolic CHF, acute on chronic ([place]) 06/14/2015  . Tobacco abuse     Patient Active Problem List   Diagnosis Date Noted  . Degenerative disc disease at L5-S1 level 10/10/2018  . Abnormal thyroid blood test 03/15/2017  . Hypokalemia 07/23/2016  . Low TSH level 04/26/2016  . Abnormal uterine bleeding 04/06/2016  . Nonspecific chest pain   . 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 NSTEMI (non-ST elevated myocardial infarction) ([place]) - with normal cornaries on Cath 06/14/2015  . Systolic and diastolic CHF, acute on chronic ([place]) 06/14/2015  . H/O noncompliance with medical treatment, presenting hazards to health   . Low back pain 09/16/2014  . Homelessness 09/16/2014  . Vitamin D deficiency 01/29/2014  . OSA (obstructive sleep apnea) 01/08/2014  . Chronic combined systolic and diastolic CHF (congestive heart failure) ([place]) 08/14/2013  . Essential hypertension   . Fibroids 08/09/2012  . Menorrhagia 05/08/2012  . Female pattern hair loss   . HLD (hyperlipidemia) 06/08/2010  . CEREBRAL ANEURYSM 06/08/2010  . 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 CEREBROVASCULAR ACCIDENT, HX OF 06/08/2010    Past Surgical History:  Procedure Laterality Date  . CARDIAC CATHETERIZATION N/A 06/16/2015   Procedure: Left Heart Cath and Coronary Angiography;  Surgeon: Jettie Booze, MD;  Location: [facility];  Service: Cardiovascular;  Laterality: N/A;  . [place]  ~ 2000  . TIBIAL TUBERCLERPLASTY    . TUBAL LIGATION  1994    OB History    Gravida  2   Para  2   Term  2   Preterm      AB      Living  3     SAB      TAB      Ectopic      Multiple  1   Live Births  3            Home Medications    Prior to Admission medications   Medication Sig Start Date End Date Taking? Authorizing Provider  acetaminophen (TYLENOL) 500 MG tablet Take 1 tablet (500 mg total) by mouth every 6 (six) hours  as needed. 10/19/18   Bast, Tressia Miners A, NP  acetaminophen-codeine (TYLENOL #3) 300-30 MG tablet Take 1 tablet by mouth every 12 (twelve) hours as needed for moderate pain. 12/07/18   Charlott Rakes, MD  aspirin 81 MG EC tablet Take 1 tablet (81 mg total) by mouth daily. 03/31/16   Milagros Loll, MD  Biotin 10000 MCG TABS Take 1 tablet by mouth daily. 04/26/16   Funches, Adriana Mccallum, MD  cyclobenzaprine (FLEXERIL) 10 MG tablet Take 1 tablet (10 mg total) by mouth 2 (two) times daily as needed for muscle spasms. 12/07/18   Charlott Rakes, MD  diclofenac sodium (VOLTAREN) 1 % GEL Apply 4 g topically 4 (four) times daily. 10/19/18   Loura Halt A, NP  DULoxetine (CYMBALTA) 30 MG capsule Take 1 capsule (30 mg total) by mouth daily. 12/07/18 01/06/19  Charlott Rakes, MD  ENTRESTO 97-103 MG TAKE 1 TABLET BY MOUTH TWICE A DAY 07/26/18   Baldwin Jamaica, PA-C  famotidine (PEPCID) 20 MG tablet Take 1 tablet (20 mg total) 2 (two) times daily by mouth. 04/10/17   Langston Masker B, PA-C  gabapentin (NEURONTIN) 300 MG capsule Take 1 capsule (300 mg total) by mouth at bedtime. 12/07/18   Charlott Rakes, MD  ibuprofen (ADVIL) 800 MG tablet Take 1 tablet (800 mg total) by mouth every 12 (twelve) hours as needed. 12/08/18   Newlin, Charlane Ferretti, MD  KLOR-CON M20 20 MEQ tablet TAKE 2 TABLETS (40 MEQ TOTAL) BY MOUTH 2 (TWO) TIMES DAILY. 06/19/18   Deboraha Sprang, MD  meclizine (ANTIVERT) 25 MG tablet Take 1 tablet (25 mg total) by mouth 2 (two) times daily as needed for dizziness. 12/07/18   Charlott Rakes, MD  metoprolol succinate (TOPROL-XL) 50 MG 24 hr tablet Take 75 mg 1 and a half tablets daily 09/22/18   Deboraha Sprang, MD  Misc. Devices MISC Please provide patient with insurance approved straight cane 09/29/18   Gildardo Pounds, NP  Misc. Devices MISC Single-point cane with adjustable height x1, shower chair x1, transfer bench x1.  Diagnosis congestive heart failure, degenerative disc disease of the lumbar spine. 11/23/18   Charlott Rakes, MD  simvastatin (ZOCOR) 10 MG tablet TAKE 1 TABLET BY MOUTH EVERY DAY IN THE EVENING 07/26/18   Baldwin Jamaica, PA-C  spironolactone (ALDACTONE) 25 MG tablet TAKE 0.5 TABLETS (12.5 MG TOTAL) BY MOUTH DAILY. 07/26/18   Baldwin Jamaica, PA-C  torsemide (DEMADEX) 20 MG tablet Take 2 tablets (40 mg total) by mouth daily. 09/22/18 09/17/19  Deboraha Sprang, MD    Family History Family History  Problem Relation Age of Onset  . Cancer Maternal Grandmother  uterine  . Hypertension Sister   . Other Neg Hx   . Heart disease Neg Hx     Social History Social History   Tobacco Use  . Smoking status: Former Smoker    Packs/day: 0.10    Years: 30.00    Pack years: 3.00    Types: Cigarettes    Quit date: 06/03/2015    Years since quitting: 3.5  . Smokeless tobacco: Never Used  . Tobacco comment: Pt. stated she stopped smoking a year ago. 09/29/2018  Substance Use Topics  . Alcohol use: No    Alcohol/week: 0.0 standard drinks  . Drug use: No     Allergies   Ace inhibitors   Review of Systems Review of Systems  Constitutional: Negative for chills and fever.  HENT: Negative for ear pain and sore throat.   Eyes: Negative for pain and visual disturbance.  Respiratory: Negative for cough and shortness of breath.   Cardiovascular: Negative for chest pain and palpitations.  Gastrointestinal: Negative for abdominal pain and vomiting.  Genitourinary: Negative for dysuria and hematuria.  Musculoskeletal: Positive for back pain. Negative for arthralgias.  Skin: Negative for color change and rash.  Neurological: Negative for seizures, syncope, weakness and numbness.  All other systems reviewed and are negative.    Physical Exam Triage Vital Signs ED Triage Vitals  Enc Vitals Group     BP 12/09/18 1011 106/79     Pulse Rate 12/09/18 1011 90     Resp 12/09/18 1011 18     Temp 12/09/18 1011 98.3 F (36.8 C)     Temp Source 12/09/18 1011 Oral     SpO2 12/09/18 1011 97 %      Weight --      Height --      Head Circumference --      Peak Flow --      Pain Score 12/09/18 1012 9     Pain Loc --      Pain Edu? --      Excl. in [place]? --    No data found.  Updated Vital Signs BP 106/79 (BP Location: Right Arm)   Pulse 90   Temp 98.3 F (36.8 C) (Oral)   Resp 18   SpO2 97%   Visual Acuity Right Eye Distance:   Left Eye Distance:   Bilateral Distance:    Right Eye Near:   Left Eye Near:    Bilateral Near:     Physical Exam Vitals signs and nursing note reviewed.  Constitutional:      General: She is not in acute distress.    Appearance: She is well-developed. She is obese.  HENT:     Head: Normocephalic and atraumatic.  Eyes:     Conjunctiva/sclera: Conjunctivae normal.  Neck:     Musculoskeletal: Neck supple.  Cardiovascular:     Rate and Rhythm: Normal rate and regular rhythm.     Heart sounds: No murmur.  Pulmonary:     Effort: Pulmonary effort is normal. No respiratory distress.     Breath sounds: Normal breath sounds.  Abdominal:     Palpations: Abdomen is soft.     Tenderness: There is no abdominal tenderness. There is no right CVA tenderness, left CVA tenderness, guarding or rebound.  Musculoskeletal: Normal range of motion.        General: Tenderness present. No deformity or signs of injury.     Comments: Right paraspinous muscle tenderness.  Skin:    General: Skin is  warm and dry.     Findings: No bruising, erythema or rash.  Neurological:     General: No focal deficit present.     Mental Status: She is alert.     Sensory: No sensory deficit.     Motor: No weakness.      UC Treatments / Results  Labs (all labs ordered are listed, but only abnormal results are displayed) Labs Reviewed - No data to display  EKG   Radiology No results found.  Procedures Procedures (including critical care time)  Medications Ordered in UC Medications  methylPREDNISolone acetate (DEPO-MEDROL) injection 40 mg (40 mg Intramuscular  Given 12/09/18 1041)  methylPREDNISolone acetate (DEPO-MEDROL) 40 MG/ML injection (has no administration in time range)    Initial Impression / Assessment and Plan / UC Course  I have reviewed the triage vital signs and the nursing notes.  Pertinent labs & imaging results that were available during my care of the patient were reviewed by me and considered in my medical decision making (see chart for details).   Chronic back pain with sciatica.  Treated today with Depo-Medrol injection.  Discussed with patient that she should continue taking the medications prescribed by her primary care provider.  Discussed that she should follow-up with her PCP next week, continue physical therapy, follow-up with orthopedic as directed.     Final Clinical Impressions(s) / UC Diagnoses   Final diagnoses:  Chronic right-sided low back pain with right-sided sciatica     Discharge Instructions     Take the medications as prescribed by your primary care provider.  You were given a steroid injection today.    Follow-up with your primary care provider next week.  Continue physical therapy as ordered.  Follow-up with the orthopedic as directed by your primary care provider.        ED Prescriptions    None     Controlled Substance Prescriptions [place] Controlled Substance Registry consulted? Yes, I have consulted the [place] Controlled Substances Registry for this patient, and feel the risk/benefit ratio today is favorable for proceeding with this prescription for a controlled substance.   Sharion Balloon, NP 12/09/18 1109

## 2018-12-18 ENCOUNTER — Other Ambulatory Visit: Payer: Self-pay

## 2018-12-18 ENCOUNTER — Ambulatory Visit: Payer: Medicaid Other | Attending: Family Medicine | Admitting: Physical Therapy

## 2018-12-18 ENCOUNTER — Encounter: Payer: Self-pay | Admitting: Physical Therapy

## 2018-12-18 DIAGNOSIS — M5442 Lumbago with sciatica, left side: Secondary | ICD-10-CM | POA: Diagnosis not present

## 2018-12-18 DIAGNOSIS — M5441 Lumbago with sciatica, right side: Secondary | ICD-10-CM | POA: Insufficient documentation

## 2018-12-18 DIAGNOSIS — G8929 Other chronic pain: Secondary | ICD-10-CM | POA: Diagnosis not present

## 2018-12-18 NOTE — Therapy (Signed)
[place], [place], [address] Phone: [phone]   Fax:  [phone]  Physical Therapy RE-Evaluation  Patient Details  Name: Chelsea Branch MRN: [id number] Date of Birth: May 04, 1966 Referring Provider (PT): Orvan July, NP   Encounter Date: 12/18/2018  PT End of Session - 12/18/18 7035    Visit Number  4    Number of Visits  4   requesting 4 more PT visits from [facility]   Date for PT Re-Evaluation  01/15/19    Authorization Type  [facility], reauthorization requested 12/18/18 for 4 more visits at one time per week for 4 weeks    Authorization - Visit Number  3    Authorization - Number of Visits  3    PT Start Time  0093   pt 14 min late   PT Stop Time  0835    PT Time Calculation (min)  36 min    Activity Tolerance  Patient tolerated treatment well    Behavior During Therapy  [facility] for tasks assessed/performed       Past Medical History:  Diagnosis Date  . Abnormal thyroid blood test 03/15/2017  . Abnormal uterine bleeding   . Alopecia   . Anemia   . Angina pectoris with normal coronary arteriogram ([place]) 2017   Had + Troponin c/w ? NSTEMI due to A on C CHF  . 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 Chronic combined systolic and diastolic CHF (congestive heart failure) ([place])   . DYSLIPIDEMIA   . Essential hypertension   . Fibroids   . H/O noncompliance with medical treatment, presenting hazards to health   . Hypertension   . Hypokalemia 07/23/2016  . Menorrhagia   . 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 iniatially ?2/2 peripartum in 1994 - improved by 2008 then worsening EF in 2011 back down to EF 25-30%. b. echo 01/21/14 showed mod LVH, [name]; [name], [name], [name],   . 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 NSTEMI (non-ST elevated myocardial infarction) ([place]) 05/2015   Normal Coronaries.  . Peripartum cardiomyopathy 1994  . Sleep apnea 2015   CPAP  12/2013  . Stroke [facility]) 2011  . Systolic and diastolic CHF, acute on chronic ([place]) 06/14/2015  . Tobacco abuse     Past Surgical History:  Procedure Laterality Date  . CARDIAC CATHETERIZATION N/A 06/16/2015   Procedure: Left Heart Cath and Coronary Angiography;  Surgeon: Jettie Booze, MD;  Location: [place];  Service: Cardiovascular;  Laterality: N/A;  . [place]  . LOOP RECORDER IMPLANT  ~ 2000  . TIBIAL TUBERCLERPLASTY    . TUBAL LIGATION  1994    There were no vitals filed for this visit.  Subjective Assessment - 12/18/18 0803    Subjective  She reports she has been in a lot of pain, she has seen two MD's about this and was referred to a spinal MD that she will set up an appointment with. She says MD wants her to try more PT. She says her radicular pain shifts from Rt leg to Lt leg at times. She is also requsting a back brace and she was instructed to reach out to MD about this. She was recommended to only use it during strenuous activity and not to wear it all the time so her core muscles do not weaken.    Currently in Pain?  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    Pain Relieving Factors  pain meds    Multiple Pain Sites  No         [facility] PT Assessment - 12/18/18 0001      Assessment   Medical Diagnosis  Chronic LBP    Referring Provider (PT)  Orvan July, NP    Next MD Visit  not scheduled      Observation/Other Assessments   Focus on Therapeutic Outcomes (FOTO)   not done [facility]      AROM   Lumbar Flexion  75%    Lumbar Extension  WNL    Lumbar - Right Side Bend  WNL    Lumbar - Left Side Bend  WNL    Lumbar - Right Rotation  WNL    Lumbar - Left Rotation  WNL      Strength   Overall Strength Comments  LE strength overall 5/5 MMT grossly  tested in sitting                   [facility] Adult PT Treatment/Exercise - 12/18/18 0001      Self-Care   Self-Care  --   general exercise recommendations     Moist Heat Therapy   Number Minutes Moist Heat  15 Minutes    Moist Heat Location  Lumbar Spine      Electrical Stimulation   Electrical Stimulation Location  lumbar    Electrical Stimulation Action  IFC    Electrical Stimulation Parameters  tolerance in sitting    Electrical Stimulation Goals  Pain             PT Education - 12/18/18 0828    Education Details  POC, back brace, shoe recommnedations, general exercise recommnedations    Person(s) Educated  Patient    Methods  Explanation    Comprehension  Verbalized understanding          PT Long Term Goals - 12/18/18 0817      PT LONG TERM GOAL #1   Title  Pt will be I and compliant with HEP. Target for all goals is 4 weeks 11/26/18     Baseline  relays compliance    Time  4    Period  Weeks    Status  Achieved      PT LONG TERM GOAL #2   Title  Pt will improve lumbar ROM to [facility] at least 60% ROM avalable.     Baseline  now >75%    Time  4    Period  Weeks    Status  Achieved      PT LONG TERM GOAL #3   Title  Pt will report less than 3/10 overall pain with usual activity.     Baseline  8/10 pain with standing, work, and walking    Time  4    Period  Weeks    Status  On-going            Plan - 12/18/18 0830    Clinical Impression Statement  Re-evaluation performed today as she is out of POC and [facility] authorization. She has made progress with PT in ROM and leg strength however  her back pain has not changed. She has met 2 out of 3 PT goals.  She would benefit from 4 more PT sessions in efforts to reduce her pain further. She may also benefit from aquatic PT as well. She was provided general education today on shoe wear, and general exercise recommendations and HEP.    Personal Factors and Comorbidities  Comorbidity 1;Comorbidity  2;Comorbidity 3+;Past/Current Experience;Time since onset of injury/illness/exacerbation    Comorbidities  PMH:DDD,CVA in 2012,NSTEMI 2017, CHF,HTN    Examination-Activity Limitations  Bend;Locomotion Level;Stand;Lift;Stairs;Squat    Examination-Participation Restrictions  Laundry;Shop;Community Activity;Driving;Cleaning    Stability/Clinical Decision Making  Evolving/Moderate complexity    Rehab Potential  Fair    PT Frequency  1x / week    PT Duration  4 weeks    PT Treatment/Interventions  Cryotherapy;Electrical Stimulation;Moist Heat;Traction;Ultrasound;Gait Scientist, forensic;Therapeutic activities;Therapeutic exercise;Neuromuscular re-education;Manual techniques;Dry needling;Passive range of motion;Taping;Spinal Manipulations;Joint Manipulations    PT Next Visit Plan  continue gait training with SPC, consider modalties and MT for pain    PT Home Exercise Plan  PPT,piriformis stretch, LTR, standing lumbar L stretch    Consulted and Agree with Plan of Care  Patient       Patient will benefit from skilled therapeutic intervention in order to improve the following deficits and impairments:  Decreased activity tolerance, Decreased endurance, Decreased range of motion, Decreased strength, Hypomobility, Increased muscle spasms, Pain, Postural dysfunction  Visit Diagnosis: 1. Chronic bilateral low back pain with bilateral sciatica        Problem List Patient Active Problem List   Diagnosis Date Noted  . Degenerative disc disease at L5-S1 level 10/10/2018  . Abnormal thyroid blood test 03/15/2017  . Hypokalemia 07/23/2016  . Low TSH level 04/26/2016  . Abnormal uterine bleeding 04/06/2016  . Nonspecific chest pain   . 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 NSTEMI (non-ST elevated myocardial infarction) ([place]) - with normal cornaries on Cath 06/14/2015  . Systolic and diastolic CHF, acute on chronic ([place]) 06/14/2015  . H/O noncompliance with medical treatment,  presenting hazards to health   . Low back pain 09/16/2014  . Homelessness 09/16/2014  . Vitamin D deficiency 01/29/2014  . OSA (obstructive sleep apnea) 01/08/2014  . Chronic combined systolic and diastolic CHF (congestive heart failure) ([place]) 08/14/2013  . Essential hypertension   . Fibroids 08/09/2012  . Menorrhagia 05/08/2012  . Female pattern hair loss   . HLD (hyperlipidemia) 06/08/2010  . CEREBRAL ANEURYSM 06/08/2010  . 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 CEREBROVASCULAR ACCIDENT, HX OF 06/08/2010    Silvestre Mesi 12/18/2018, 8:34 AM  [facility] [address] [place], [place], [address] Phone: [phone]   Fax:  [phone]  Name: Chelsea Branch MRN: [id number] Date of Birth: [birth year]

## 2018-12-22 ENCOUNTER — Ambulatory Visit: Payer: Medicaid Other | Admitting: Physical Therapy

## 2018-12-22 ENCOUNTER — Telehealth: Payer: Self-pay | Admitting: Physical Therapy

## 2018-12-22 NOTE — Telephone Encounter (Signed)
Left voicemail regarding no show to appointment today. Left next appointment date/time and asked that she call to cancel or reschedule, prior, if needed.

## 2018-12-29 ENCOUNTER — Other Ambulatory Visit: Payer: Self-pay

## 2018-12-29 ENCOUNTER — Ambulatory Visit: Payer: Medicaid Other | Attending: Family Medicine | Admitting: Physical Therapy

## 2018-12-29 DIAGNOSIS — G8929 Other chronic pain: Secondary | ICD-10-CM | POA: Diagnosis not present

## 2018-12-29 DIAGNOSIS — M5441 Lumbago with sciatica, right side: Secondary | ICD-10-CM | POA: Diagnosis not present

## 2018-12-29 DIAGNOSIS — M5442 Lumbago with sciatica, left side: Secondary | ICD-10-CM | POA: Insufficient documentation

## 2018-12-29 NOTE — Therapy (Addendum)
[place], [place], [address] Phone: [phone]   Fax:  [phone]  Physical Therapy Treatment/Discharge  Patient Details  Name: Chelsea Branch MRN: [id number] Date of Birth: [birth year] Referring Provider (PT): Orvan July, NP   Encounter Date: 12/29/2018  PT End of Session - 12/29/18 1101    Visit Number  5    Number of Visits  8    Date for PT Re-Evaluation  01/15/19    Authorization Time Period  MCD approved 4 more visits 7/28-8/24    Authorization - Visit Number  1    Authorization - Number of Visits  4    PT Start Time  1102    PT Stop Time  1141    PT Time Calculation (min)  39 min    Activity Tolerance  Patient tolerated treatment well       Past Medical History:  Diagnosis Date  . Abnormal thyroid blood test 03/15/2017  . Abnormal uterine bleeding   . Alopecia   . Anemia   . Angina pectoris with normal coronary arteriogram ([place]) 2017   Had + Troponin c/w ? NSTEMI due to A on C CHF  . 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 Chronic combined systolic and diastolic CHF (congestive heart failure) ([place])   . DYSLIPIDEMIA   . Essential hypertension   . Fibroids   . H/O noncompliance with medical treatment, presenting hazards to health   . Hypertension   . Hypokalemia 07/23/2016  . Menorrhagia   . Nonischemic cardiomyopathy ([place]) 1994; 2017   a. iniatially ?2/2 peripartum in 1994 - improved by 2008 then worsening EF in 2011 back down to EF 25-30%. b. echo 01/21/14 showed mod LVH, [name]; [name], [name], [name],   . 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 NSTEMI (non-ST elevated myocardial infarction) ([place]) 05/2015   Normal Coronaries.  . Peripartum cardiomyopathy 1994  . Sleep apnea 2015   CPAP 12/2013  . Stroke [facility]) 2011  . Systolic and diastolic CHF, acute on chronic ([place]) 06/14/2015  . Tobacco abuse     Past Surgical History:  Procedure  Laterality Date  . CARDIAC CATHETERIZATION N/A 06/16/2015   Procedure: Left Heart Cath and Coronary Angiography;  Surgeon: Jettie Booze, MD;  Location: [facility];  Service: Cardiovascular;  Laterality: N/A;  . [place]  . LOOP RECORDER IMPLANT  ~ 2000  . TIBIAL TUBERCLERPLASTY    . TUBAL LIGATION  1994    There were no vitals filed for this visit.  Subjective Assessment - 12/29/18 1107    Subjective  PT reports she hasn't done her HEP last week, she was thinks her blood pressure was high, it is under control now  - she states she will return to her HEP    Currently in Pain?  Yes    Pain Score  7     Pain Location  Back    Pain Orientation  Lower    Pain Type  Chronic pain    Pain Radiating Towards  into bilat legs.    Pain Onset  More than a month ago    Pain Frequency  Constant    Aggravating Factors   prolonged    Pain Relieving Factors  meds                       [facility] Adult PT Treatment/Exercise -  12/29/18 0001      Lumbar Exercises: Stretches   Active Hamstring Stretch  Left;Right;1 rep;20 seconds    Lower Trunk Rotation  5 reps;10 seconds      Lumbar Exercises: Supine   Pelvic Tilt  10 reps;5 seconds    Bridge  15 reps;Compliant    Bridge with clamshell  Compliant;15 reps   verbal cues for sequence   Isometric Hip Flexion  10 reps;5 seconds   alternating legs     Modalities   Modalities  Electrical Stimulation;Traction;Moist Heat      Moist Heat Therapy   Number Minutes Moist Heat  15 Minutes    Moist Heat Location  Lumbar Spine      Electrical Stimulation   Electrical Stimulation Location  lumbar    Electrical Stimulation Action  IFC     Electrical Stimulation Parameters  while on traction to tolerance    Electrical Stimulation Goals  Pain      Traction   Type of Traction  Lumbar    Min (lbs)  50    Max (lbs)  70    Hold Time  60    Rest Time  20    Time  15                  PT Long Term  Goals - 12/18/18 0817      PT LONG TERM GOAL #1   Title  Pt will be I and compliant with HEP. Target for all goals is 4 weeks 11/26/18     Baseline  relays compliance    Time  4    Period  Weeks    Status  Achieved      PT LONG TERM GOAL #2   Title  Pt will improve lumbar ROM to [facility] at least 60% ROM avalable.     Baseline  now >75%    Time  4    Period  Weeks    Status  Achieved      PT LONG TERM GOAL #3   Title  Pt will report less than 3/10 overall pain with usual activity.     Baseline  8/10 pain with standing, work, and walking    Time  4    Period  Weeks    Status  On-going            Plan - 12/29/18 1127    Clinical Impression Statement  Pt reports no changed yet, however she was not able to perform her HEP last week d/t blood pressure issues.  Tried traction today to see if that will help relieve radicular signs.  No goals met, she sees orthopedic MD next week.    Rehab Potential  Fair    PT Frequency  1x / week    PT Duration  4 weeks    PT Treatment/Interventions  Cryotherapy;Electrical Stimulation;Moist Heat;Traction;Ultrasound;Gait Scientist, forensic;Therapeutic activities;Therapeutic exercise;Neuromuscular re-education;Manual techniques;Dry needling;Passive range of motion;Taping;Spinal Manipulations;Joint Manipulations    PT Next Visit Plan  assess response to traction, increase pull if tolerated well.       Patient will benefit from skilled therapeutic intervention in order to improve the following deficits and impairments:  Decreased activity tolerance, Decreased endurance, Decreased range of motion, Decreased strength, Hypomobility, Increased muscle spasms, Pain, Postural dysfunction  Visit Diagnosis: 1. Chronic bilateral low back pain with bilateral sciatica        Problem List Patient Active Problem List   Diagnosis Date Noted  . Degenerative disc disease  at L5-S1 level 10/10/2018  . Abnormal thyroid blood test 03/15/2017  . Hypokalemia  07/23/2016  . Low TSH level 04/26/2016  . Abnormal uterine bleeding 04/06/2016  . Nonspecific chest pain   . 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 NSTEMI (non-ST elevated myocardial infarction) ([place]) - with normal cornaries on Cath 06/14/2015  . Systolic and diastolic CHF, acute on chronic ([place]) 06/14/2015  . H/O noncompliance with medical treatment, presenting hazards to health   . Low back pain 09/16/2014  . Homelessness 09/16/2014  . Vitamin D deficiency 01/29/2014  . OSA (obstructive sleep apnea) 01/08/2014  . Chronic combined systolic and diastolic CHF (congestive heart failure) ([place]) 08/14/2013  . Essential hypertension   . Fibroids 08/09/2012  . Menorrhagia 05/08/2012  . Female pattern hair loss   . HLD (hyperlipidemia) 06/08/2010  . CEREBRAL ANEURYSM 06/08/2010  . 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 CEREBROVASCULAR ACCIDENT, HX OF 06/08/2010    Jeral Pinch PT  12/29/2018, 11:30 AM  [facility] [address] [place], [place], [address] Phone: [phone]   Fax:  [phone]  Name: Chelsea Branch MRN: [id number] Date of Birth: 04-01-1966   PHYSICAL THERAPY DISCHARGE SUMMARY  Visits from Start of Care: 5  Current functional level related to goals / functional outcomes: Unknown as relates to goals for PT   Remaining deficits: unknown   Education / Equipment: HEP Plan:                                                    Patient goals were partially met. Patient is being discharged due to not returning since the last visit. as noted in chart that patient is having other medical issues she is dealing with.  ?????    Jeral Pinch, PT 03/05/19 9:13 AM

## 2019-01-01 ENCOUNTER — Institutional Professional Consult (permissible substitution): Payer: Medicaid Other | Admitting: Pulmonary Disease

## 2019-01-05 ENCOUNTER — Ambulatory Visit: Payer: Medicaid Other | Admitting: Physical Therapy

## 2019-01-18 ENCOUNTER — Institutional Professional Consult (permissible substitution): Payer: Medicaid Other | Admitting: Pulmonary Disease

## 2019-01-20 ENCOUNTER — Encounter (HOSPITAL_COMMUNITY): Payer: Self-pay

## 2019-01-20 ENCOUNTER — Other Ambulatory Visit: Payer: Self-pay

## 2019-01-20 ENCOUNTER — Ambulatory Visit (HOSPITAL_COMMUNITY)
Admission: EM | Admit: 2019-01-20 | Discharge: 2019-01-20 | Disposition: A | Discharge status: Home / Self Care | Payer: Medicaid Other | Attending: Emergency Medicine | Admitting: Emergency Medicine

## 2019-01-20 DIAGNOSIS — Z888 Allergy status to other drugs, medicaments and biological substances status: Secondary | ICD-10-CM | POA: Insufficient documentation

## 2019-01-20 DIAGNOSIS — Z20822 Contact with and (suspected) exposure to covid-19: Secondary | ICD-10-CM

## 2019-01-20 DIAGNOSIS — Z7982 Long term (current) use of aspirin: Secondary | ICD-10-CM | POA: Diagnosis not present

## 2019-01-20 DIAGNOSIS — Z20828 Contact with and (suspected) exposure to other viral communicable diseases: Secondary | ICD-10-CM | POA: Diagnosis not present

## 2019-01-20 DIAGNOSIS — Z8249 Family history of ischemic heart disease and other diseases of the circulatory system: Secondary | ICD-10-CM | POA: Insufficient documentation

## 2019-01-20 DIAGNOSIS — Z79899 Other long term (current) drug therapy: Secondary | ICD-10-CM | POA: Diagnosis not present

## 2019-01-20 DIAGNOSIS — R05 Cough: Secondary | ICD-10-CM | POA: Diagnosis not present

## 2019-01-20 DIAGNOSIS — R059 Cough, unspecified: Secondary | ICD-10-CM

## 2019-01-20 DIAGNOSIS — Z87891 Personal history of nicotine dependence: Secondary | ICD-10-CM | POA: Insufficient documentation

## 2019-01-20 MED ORDER — ALBUTEROL SULFATE HFA 108 (90 BASE) MCG/ACT IN AERS: 1.0000 | INHALATION_SPRAY | Freq: Four times a day (QID) | RESPIRATORY_TRACT | 0 refills | Status: DC | PRN

## 2019-01-20 MED ORDER — GUAIFENESIN-CODEINE 100-10 MG/5ML PO SOLN: 5.0000 mL | Freq: Three times a day (TID) | ORAL | 0 refills | Status: DC | PRN

## 2019-01-20 NOTE — Discharge Instructions (Signed)
Use the albuterol inhaler as prescribed.  You can take the prescribed cough medicine at bedtime; do not drive, operate machinery, or drink alcohol with this medication as it may make you drowsy.    Follow-up with your primary care provider on Monday.  Return here or go to the emergency department if you develop other symptoms such as swelling in your legs, fever, chills, sore throat, shortness of breath, vomiting, diarrhea, rash, or other concerning symptoms.

## 2019-01-20 NOTE — ED Triage Notes (Signed)
Pt present she is coughing and spitting up clear mucous. Symptoms started two weeks ago. Pt has tried OTC medication with no relief.

## 2019-01-20 NOTE — ED Provider Notes (Signed)
[facility]    CSN: [id number] Arrival date & time: 01/20/19  1010      History   Chief Complaint Chief Complaint  Patient presents with  . Cough    HPI Chelsea Branch is a 53 y.o. female.   Patient presents with cough productive of clear mucus x2 weeks.  She states it is her "bronchitis".  She denies other symptoms such as LE edema, fever, chills, sore throat, shortness of breath, vomiting, diarrhea, rash.  She denies known contacts for illness.  No treatments attempted at home.  Patient states she needs a refill on her albuterol inhaler.  She also request cough medication for her nighttime coughing which is interrupting her sleep.  She also request a COVID test.    The history is provided by the patient.    Past Medical History:  Diagnosis Date  . Abnormal thyroid blood test 03/15/2017  . Abnormal uterine bleeding   . Alopecia   . Anemia   . Angina pectoris with normal coronary arteriogram ([place]) 2017   Had + Troponin c/w ? NSTEMI due to A on C CHF  . 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 Chronic combined systolic and diastolic CHF (congestive heart failure) ([place])   . DYSLIPIDEMIA   . Essential hypertension   . Fibroids   . H/O noncompliance with medical treatment, presenting hazards to health   . Hypertension   . Hypokalemia 07/23/2016  . Menorrhagia   . 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 iniatially ?2/2 peripartum in 1994 - improved by 2008 then worsening EF in 2011 back down to EF 25-30%. b. echo 01/21/14 showed mod LVH, [name]; [name], [name], [name],   . 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 NSTEMI (non-ST elevated myocardial infarction) ([place]) 05/2015   Normal Coronaries.  . Peripartum cardiomyopathy 1994  . Sleep apnea 2015   CPAP 12/2013  . Stroke [facility]) 2011  . Systolic and diastolic CHF, acute on chronic ([place]) 06/14/2015  . Tobacco abuse     Patient Active Problem List   Diagnosis Date Noted   . Degenerative disc disease at L5-S1 level 10/10/2018  . Abnormal thyroid blood test 03/15/2017  . Hypokalemia 07/23/2016  . Low TSH level 04/26/2016  . Abnormal uterine bleeding 04/06/2016  . Nonspecific chest pain   . 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 NSTEMI (non-ST elevated myocardial infarction) ([place]) - with normal cornaries on Cath 06/14/2015  . Systolic and diastolic CHF, acute on chronic ([place]) 06/14/2015  . H/O noncompliance with medical treatment, presenting hazards to health   . Low back pain 09/16/2014  . Homelessness 09/16/2014  . Vitamin D deficiency 01/29/2014  . OSA (obstructive sleep apnea) 01/08/2014  . Chronic combined systolic and diastolic CHF (congestive heart failure) ([place]) 08/14/2013  . Essential hypertension   . Fibroids 08/09/2012  . Menorrhagia 05/08/2012  . Female pattern hair loss   . HLD (hyperlipidemia) 06/08/2010  . CEREBRAL ANEURYSM 06/08/2010  . 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 CEREBROVASCULAR ACCIDENT, HX OF 06/08/2010    Past Surgical History:  Procedure Laterality Date  . CARDIAC CATHETERIZATION N/A 06/16/2015   Procedure: Left Heart Cath and Coronary Angiography;  Surgeon: Jettie Booze, MD;  Location: [facility];  Service: Cardiovascular;  Laterality: N/A;  . [place]  . LOOP RECORDER IMPLANT  ~ 2000  . TIBIAL TUBERCLERPLASTY    . [place]  OB History    Gravida  2   Para  2   Term  2   Preterm      AB      Living  3     SAB      TAB      Ectopic      Multiple  1   Live Births  3            Home Medications    Prior to Admission medications   Medication Sig Start Date End Date Taking? Authorizing Provider  acetaminophen (TYLENOL) 500 MG tablet Take 1 tablet (500 mg total) by mouth every 6 (six) hours as needed. 10/19/18   Bast, Tressia Miners A, NP  acetaminophen-codeine (TYLENOL #3) 300-30 MG tablet Take 1 tablet by mouth every 12 (twelve)  hours as needed for moderate pain. 12/07/18   Charlott Rakes, MD  albuterol (VENTOLIN HFA) 108 (90 Base) MCG/ACT inhaler Inhale 1-2 puffs into the lungs every 6 (six) hours as needed for wheezing or shortness of breath. 01/20/19   Sharion Balloon, NP  aspirin 81 MG EC tablet Take 1 tablet (81 mg total) by mouth daily. 03/31/16   Milagros Loll, MD  Biotin 10000 MCG TABS Take 1 tablet by mouth daily. 04/26/16   Funches, Adriana Mccallum, MD  cyclobenzaprine (FLEXERIL) 10 MG tablet Take 1 tablet (10 mg total) by mouth 2 (two) times daily as needed for muscle spasms. 12/07/18   Charlott Rakes, MD  diclofenac sodium (VOLTAREN) 1 % GEL Apply 4 g topically 4 (four) times daily. 10/19/18   Loura Halt A, NP  DULoxetine (CYMBALTA) 30 MG capsule Take 1 capsule (30 mg total) by mouth daily. 12/07/18 01/06/19  Charlott Rakes, MD  ENTRESTO 97-103 MG TAKE 1 TABLET BY MOUTH TWICE A DAY 07/26/18   Baldwin Jamaica, PA-C  famotidine (PEPCID) 20 MG tablet Take 1 tablet (20 mg total) 2 (two) times daily by mouth. 04/10/17   Langston Masker B, PA-C  gabapentin (NEURONTIN) 300 MG capsule Take 1 capsule (300 mg total) by mouth at bedtime. 12/07/18   Charlott Rakes, MD  guaiFENesin-codeine 100-10 MG/5ML syrup Take 5 mLs by mouth 3 (three) times daily as needed for cough. 01/20/19   Sharion Balloon, NP  ibuprofen (ADVIL) 800 MG tablet Take 1 tablet (800 mg total) by mouth every 12 (twelve) hours as needed. 12/08/18   [place]lin, Charlane Ferretti, MD  KLOR-CON M20 20 MEQ tablet TAKE 2 TABLETS (40 MEQ TOTAL) BY MOUTH 2 (TWO) TIMES DAILY. 06/19/18   Deboraha Sprang, MD  meclizine (ANTIVERT) 25 MG tablet Take 1 tablet (25 mg total) by mouth 2 (two) times daily as needed for dizziness. 12/07/18   Charlott Rakes, MD  metoprolol succinate (TOPROL-XL) 50 MG 24 hr tablet Take 75 mg 1 and a half tablets daily 09/22/18   Deboraha Sprang, MD  Misc. Devices MISC Please provide patient with insurance approved straight cane 09/29/18   Gildardo Pounds, NP  Misc. Devices  MISC Single-point cane with adjustable height x1, shower chair x1, transfer bench x1.  Diagnosis congestive heart failure, degenerative disc disease of the lumbar spine. 11/23/18   Charlott Rakes, MD  simvastatin (ZOCOR) 10 MG tablet TAKE 1 TABLET BY MOUTH EVERY DAY IN THE EVENING 07/26/18   Baldwin Jamaica, PA-C  spironolactone (ALDACTONE) 25 MG tablet TAKE 0.5 TABLETS (12.5 MG TOTAL) BY MOUTH DAILY. 07/26/18   Baldwin Jamaica, PA-C  torsemide (DEMADEX) 20 MG tablet Take 2 tablets (  40 mg total) by mouth daily. 09/22/18 09/17/19  Deboraha Sprang, MD    Family History Family History  Problem Relation Age of Onset  . Cancer Maternal Grandmother        uterine  . Hypertension Sister   . Other Neg Hx   . Heart disease Neg Hx     Social History Social History   Tobacco Use  . Smoking status: Former Smoker    Packs/day: 0.10    Years: 30.00    Pack years: 3.00    Types: Cigarettes    Quit date: 06/03/2015    Years since quitting: 3.6  . Smokeless tobacco: Never Used  . Tobacco comment: Pt. stated she stopped smoking a year ago. 09/29/2018  Substance Use Topics  . Alcohol use: No    Alcohol/week: 0.0 standard drinks  . Drug use: No     Allergies   Ace inhibitors   Review of Systems Review of Systems  Constitutional: Negative for chills and fever.  HENT: Negative for congestion, ear pain, rhinorrhea and sore throat.   Eyes: Negative for pain and visual disturbance.  Respiratory: Positive for cough. Negative for shortness of breath and wheezing.   Cardiovascular: Negative for chest pain, palpitations and leg swelling.  Gastrointestinal: Negative for abdominal pain, diarrhea and vomiting.  Genitourinary: Negative for dysuria and hematuria.  Musculoskeletal: Negative for arthralgias and back pain.  Skin: Negative for color change and rash.  Neurological: Negative for seizures and syncope.  All other systems reviewed and are negative.    Physical Exam Triage Vital Signs ED  Triage Vitals  Enc Vitals Group     BP 01/20/19 1032 105/77     Pulse Rate 01/20/19 1032 74     Resp --      Temp 01/20/19 1032 98.5 F (36.9 C)     Temp Source 01/20/19 1032 Oral     SpO2 01/20/19 1032 98 %     Weight --      Height --      Head Circumference --      Peak Flow --      Pain Score 01/20/19 1033 0     Pain Loc --      Pain Edu? --      Excl. in [place]? --    No data found.  Updated Vital Signs BP 105/77 (BP Location: Right Arm)   Pulse 74   Temp 98.5 F (36.9 C) (Oral)   SpO2 98%   Visual Acuity Right Eye Distance:   Left Eye Distance:   Bilateral Distance:    Right Eye Near:   Left Eye Near:    Bilateral Near:     Physical Exam Vitals signs and nursing note reviewed.  Constitutional:      General: She is not in acute distress.    Appearance: She is well-developed.  HENT:     Head: Normocephalic and atraumatic.     Right Ear: Tympanic membrane normal.     Left Ear: Tympanic membrane normal.     Nose: Nose normal.     Mouth/Throat:     Mouth: Mucous membranes are moist.     Pharynx: Oropharynx is clear.  Eyes:     Conjunctiva/sclera: Conjunctivae normal.  Neck:     Musculoskeletal: Neck supple.  Cardiovascular:     Rate and Rhythm: Normal rate and regular rhythm.     Heart sounds: No murmur.  Pulmonary:     Effort: Pulmonary effort is normal. No  respiratory distress.     Breath sounds: Normal breath sounds. No wheezing or rhonchi.  Abdominal:     General: Bowel sounds are normal.     Palpations: Abdomen is soft.     Tenderness: There is no abdominal tenderness. There is no guarding or rebound.  Musculoskeletal:     Right lower leg: No edema.     Left lower leg: No edema.  Skin:    General: Skin is warm and dry.     Findings: No rash.  Neurological:     Mental Status: She is alert.      UC Treatments / Results  Labs (all labs ordered are listed, but only abnormal results are displayed) Labs Reviewed  NOVEL CORONAVIRUS, NAA (HOSP  ORDER, SEND-OUT TO REF LAB; TAT 18-24 HRS)    EKG   Radiology No results found.  Procedures Procedures (including critical care time)  Medications Ordered in UC Medications - No data to display  Initial Impression / Assessment and Plan / UC Course  I have reviewed the triage vital signs and the nursing notes.  Pertinent labs & imaging results that were available during my care of the patient were reviewed by me and considered in my medical decision making (see chart for details).     Cough, suspected COVID.  Treating with albuterol MDI and Robitussin with codeine for bedtime cough.  Precautions for codeine given to patient including not to drive, operate machinery, or drink alcohol with this medication.  Instructed patient to follow-up with her PCP on Monday.  Instructed her to return here or go to the ED if she develops other symptoms such as lower extremity edema, fever, chills, sore throat, shortness of breath, vomiting, diarrhea, rash.  Patient agrees to treatment plan.     Final Clinical Impressions(s) / UC Diagnoses   Final diagnoses:  Cough  Suspected Covid-19 Virus Infection     Discharge Instructions     Use the albuterol inhaler as prescribed.  You can take the prescribed cough medicine at bedtime; do not drive, operate machinery, or drink alcohol with this medication as it may make you drowsy.    Follow-up with your primary care provider on Monday.  Return here or go to the emergency department if you develop other symptoms such as swelling in your legs, fever, chills, sore throat, shortness of breath, vomiting, diarrhea, rash, or other concerning symptoms.        ED Prescriptions    Medication Sig Dispense Auth. Provider   albuterol (VENTOLIN HFA) 108 (90 Base) MCG/ACT inhaler Inhale 1-2 puffs into the lungs every 6 (six) hours as needed for wheezing or shortness of breath. 18 g Sharion Balloon, NP   guaiFENesin-codeine 100-10 MG/5ML syrup Take 5 mLs by mouth 3  (three) times daily as needed for cough. 120 mL Sharion Balloon, NP     Controlled Substance Prescriptions [place] Controlled Substance Registry consulted? Yes, I have consulted the [place] Controlled Substances Registry for this patient, and feel the risk/benefit ratio today is favorable for proceeding with this prescription for a controlled substance.   Sharion Balloon, NP 01/20/19 1057

## 2019-01-21 LAB — NOVEL CORONAVIRUS, NAA (HOSP ORDER, SEND-OUT TO REF LAB; TAT 18-24 HRS): SARS-CoV-2, NAA: NOT DETECTED

## 2019-02-07 ENCOUNTER — Emergency Department (HOSPITAL_COMMUNITY)
Admission: EM | Admit: 2019-02-07 | Discharge: 2019-02-07 | Disposition: A | Discharge status: Home / Self Care | Payer: Medicaid Other | Attending: Emergency Medicine | Admitting: Emergency Medicine

## 2019-02-07 ENCOUNTER — Encounter (HOSPITAL_COMMUNITY): Payer: Self-pay | Admitting: Emergency Medicine

## 2019-02-07 ENCOUNTER — Other Ambulatory Visit: Payer: Self-pay

## 2019-02-07 ENCOUNTER — Emergency Department (HOSPITAL_COMMUNITY): Payer: Medicaid Other

## 2019-02-07 DIAGNOSIS — Z87891 Personal history of nicotine dependence: Secondary | ICD-10-CM | POA: Diagnosis not present

## 2019-02-07 DIAGNOSIS — R079 Chest pain, unspecified: Secondary | ICD-10-CM | POA: Diagnosis not present

## 2019-02-07 DIAGNOSIS — Z7982 Long term (current) use of aspirin: Secondary | ICD-10-CM | POA: Diagnosis not present

## 2019-02-07 DIAGNOSIS — I110 Hypertensive heart disease with heart failure: Secondary | ICD-10-CM | POA: Diagnosis not present

## 2019-02-07 DIAGNOSIS — R0789 Other chest pain: Secondary | ICD-10-CM | POA: Insufficient documentation

## 2019-02-07 DIAGNOSIS — Z79899 Other long term (current) drug therapy: Secondary | ICD-10-CM | POA: Diagnosis not present

## 2019-02-07 DIAGNOSIS — I5042 Chronic combined systolic (congestive) and diastolic (congestive) heart failure: Secondary | ICD-10-CM | POA: Diagnosis not present

## 2019-02-07 LAB — I-STAT BETA HCG BLOOD, ED (MC, WL, AP ONLY): I-stat hCG, quantitative: <5 m[IU]/mL (ref <5)

## 2019-02-07 LAB — CBC
HCT: 42.9 % (ref 36.0–46.0)
Hemoglobin: 13.9 g/dL (ref 12.0–15.0)
MCH: 29.7 pg (ref 26.0–34.0)
MCHC: 32.4 g/dL (ref 30.0–36.0)
MCV: 91.7 fL (ref 80.0–100.0)
Platelets: 257 10*3/uL (ref 150–400)
RBC: 4.68 MIL/uL (ref 3.87–5.11)
RDW: 14.7 % (ref 11.5–15.5)
WBC: 9 10*3/uL (ref 4.0–10.5)
nRBC: 0 % (ref 0.0–0.2)

## 2019-02-07 LAB — BASIC METABOLIC PANEL
Anion gap: 14 (ref 5–15)
BUN: 14 mg/dL (ref 6–20)
CO2: 24 mmol/L (ref 22–32)
Calcium: 10.2 mg/dL (ref 8.9–10.3)
Chloride: 100 mmol/L (ref 98–111)
Creatinine, Ser: 1.11 mg/dL — ABNORMAL HIGH (ref 0.44–1.00)
GFR calc Af Amer: >60 mL/min (ref >60)
GFR calc non Af Amer: 57 mL/min — ABNORMAL LOW (ref >60)
Glucose, Bld: 174 mg/dL — ABNORMAL HIGH (ref 70–99)
Potassium: 3.5 mmol/L (ref 3.5–5.1)
Sodium: 138 mmol/L (ref 135–145)

## 2019-02-07 LAB — TROPONIN I (HIGH SENSITIVITY)
Troponin I (High Sensitivity): 3 ng/L (ref <18)
Troponin I (High Sensitivity): 3 ng/L (ref <18)

## 2019-02-07 MED ORDER — SODIUM CHLORIDE 0.9% FLUSH: 3.0000 mL | Freq: Once | INTRAVENOUS | Status: DC

## 2019-02-07 NOTE — ED Triage Notes (Signed)
Pt states she woke up this morning at 0500 with right sided chest pain that "felt like gas". Pt denies any sob or other sxs.

## 2019-02-07 NOTE — ED Provider Notes (Addendum)
[facility] EMERGENCY DEPARTMENT Provider Note   CSN: [id number] Arrival date & time: 02/07/19  0754     History   Chief Complaint Chief Complaint  Patient presents with  . Chest Pain    HPI Chelsea Branch is a 53 y.o. female.     HPI Patient presents to the emergency department with chest pain on the right side of her chest that started this morning around 530.  The patient states that she did take some Tums but did not get much relief.  Patient states she is treated for CHF but has no other cardiac related issues.  Patient states that this does not feel similar to any of her previous episodes with her CHF.  The patient denies , shortness of breath, headache,blurred vision, neck pain, fever, cough, weakness, numbness, dizziness, anorexia, edema, abdominal pain, nausea, vomiting, diarrhea, rash, back pain, dysuria, hematemesis, bloody stool, near syncope, or syncope. Past Medical History:  Diagnosis Date  . Abnormal thyroid blood test 03/15/2017  . Abnormal uterine bleeding   . Alopecia   . Anemia   . Angina pectoris with normal coronary arteriogram ([place]) 2017   Had + Troponin c/w ? NSTEMI due to A on C CHF  . 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 Chronic combined systolic and diastolic CHF (congestive heart failure) ([place])   . DYSLIPIDEMIA   . Essential hypertension   . Fibroids   . H/O noncompliance with medical treatment, presenting hazards to health   . Hypertension   . Hypokalemia 07/23/2016  . Menorrhagia   . 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 iniatially ?2/2 peripartum in 1994 - improved by 2008 then worsening EF in 2011 back down to EF 25-30%. b. echo 01/21/14 showed mod LVH, [name]; [name], [name], [name],   . 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 NSTEMI (non-ST elevated myocardial infarction) ([place]) 05/2015   Normal Coronaries.  . Peripartum cardiomyopathy 1994  . Sleep apnea 2015   CPAP  12/2013  . Stroke [facility]) 2011  . Systolic and diastolic CHF, acute on chronic ([place]) 06/14/2015  . Tobacco abuse     Patient Active Problem List   Diagnosis Date Noted  . Degenerative disc disease at L5-S1 level 10/10/2018  . Abnormal thyroid blood test 03/15/2017  . Hypokalemia 07/23/2016  . Low TSH level 04/26/2016  . Abnormal uterine bleeding 04/06/2016  . Nonspecific chest pain   . 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 NSTEMI (non-ST elevated myocardial infarction) ([place]) - with normal cornaries on Cath 06/14/2015  . Systolic and diastolic CHF, acute on chronic ([place]) 06/14/2015  . H/O noncompliance with medical treatment, presenting hazards to health   . Low back pain 09/16/2014  . Homelessness 09/16/2014  . Vitamin D deficiency 01/29/2014  . OSA (obstructive sleep apnea) 01/08/2014  . Chronic combined systolic and diastolic CHF (congestive heart failure) ([place]) 08/14/2013  . Essential hypertension   . Fibroids 08/09/2012  . Menorrhagia 05/08/2012  . Female pattern hair loss   . HLD (hyperlipidemia) 06/08/2010  . CEREBRAL ANEURYSM 06/08/2010  . 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 CEREBROVASCULAR ACCIDENT, HX OF 06/08/2010    Past Surgical History:  Procedure Laterality Date  . CARDIAC CATHETERIZATION N/A 06/16/2015   Procedure: Left Heart Cath and Coronary Angiography;  Surgeon: Jettie Booze, MD;  Location: [facility] CV [facility];  Service: Cardiovascular;  Laterality: N/A;  . [place]  . LOOP RECORDER IMPLANT  ~  [place]    . TUBAL LIGATION  1994     OB History    Gravida  2   Para  2   Term  2   Preterm      AB      Living  3     SAB      TAB      Ectopic      Multiple  1   Live Births  3            Home Medications    Prior to Admission medications   Medication Sig Start Date End Date Taking? Authorizing Provider  acetaminophen (TYLENOL) 500 MG tablet Take 1 tablet (500 mg  total) by mouth every 6 (six) hours as needed. 10/19/18   Bast, Tressia Miners A, NP  acetaminophen-codeine (TYLENOL #3) 300-30 MG tablet Take 1 tablet by mouth every 12 (twelve) hours as needed for moderate pain. 12/07/18   Charlott Rakes, MD  albuterol (VENTOLIN HFA) 108 (90 Base) MCG/ACT inhaler Inhale 1-2 puffs into the lungs every 6 (six) hours as needed for wheezing or shortness of breath. 01/20/19   Sharion Balloon, NP  aspirin 81 MG EC tablet Take 1 tablet (81 mg total) by mouth daily. 03/31/16   Milagros Loll, MD  Biotin 10000 MCG TABS Take 1 tablet by mouth daily. 04/26/16   Funches, Adriana Mccallum, MD  cyclobenzaprine (FLEXERIL) 10 MG tablet Take 1 tablet (10 mg total) by mouth 2 (two) times daily as needed for muscle spasms. 12/07/18   Charlott Rakes, MD  diclofenac sodium (VOLTAREN) 1 % GEL Apply 4 g topically 4 (four) times daily. 10/19/18   Loura Halt A, NP  DULoxetine (CYMBALTA) 30 MG capsule Take 1 capsule (30 mg total) by mouth daily. 12/07/18 01/06/19  Charlott Rakes, MD  ENTRESTO 97-103 MG TAKE 1 TABLET BY MOUTH TWICE A DAY 07/26/18   Baldwin Jamaica, PA-C  famotidine (PEPCID) 20 MG tablet Take 1 tablet (20 mg total) 2 (two) times daily by mouth. 04/10/17   Langston Masker B, PA-C  gabapentin (NEURONTIN) 300 MG capsule Take 1 capsule (300 mg total) by mouth at bedtime. 12/07/18   Charlott Rakes, MD  guaiFENesin-codeine 100-10 MG/5ML syrup Take 5 mLs by mouth 3 (three) times daily as needed for cough. 01/20/19   Sharion Balloon, NP  ibuprofen (ADVIL) 800 MG tablet Take 1 tablet (800 mg total) by mouth every 12 (twelve) hours as needed. 12/08/18   Newlin, Charlane Ferretti, MD  KLOR-CON M20 20 MEQ tablet TAKE 2 TABLETS (40 MEQ TOTAL) BY MOUTH 2 (TWO) TIMES DAILY. 06/19/18   Deboraha Sprang, MD  meclizine (ANTIVERT) 25 MG tablet Take 1 tablet (25 mg total) by mouth 2 (two) times daily as needed for dizziness. 12/07/18   Charlott Rakes, MD  metoprolol succinate (TOPROL-XL) 50 MG 24 hr tablet Take 75 mg 1 and a half  tablets daily 09/22/18   Deboraha Sprang, MD  Misc. Devices MISC Please provide patient with insurance approved straight cane 09/29/18   Gildardo Pounds, NP  Misc. Devices MISC Single-point cane with adjustable height x1, shower chair x1, transfer bench x1.  Diagnosis congestive heart failure, degenerative disc disease of the lumbar spine. 11/23/18   Charlott Rakes, MD  simvastatin (ZOCOR) 10 MG tablet TAKE 1 TABLET BY MOUTH EVERY DAY IN THE EVENING 07/26/18   Baldwin Jamaica, PA-C  spironolactone (ALDACTONE) 25 MG tablet TAKE 0.5 TABLETS (12.5 MG TOTAL) BY MOUTH  DAILY. 07/26/18   Baldwin Jamaica, PA-C  torsemide (DEMADEX) 20 MG tablet Take 2 tablets (40 mg total) by mouth daily. 09/22/18 09/17/19  Deboraha Sprang, MD    Family History Family History  Problem Relation Age of Onset  . Cancer Maternal Grandmother        uterine  . Hypertension Sister   . Other Neg Hx   . Heart disease Neg Hx     Social History Social History   Tobacco Use  . Smoking status: Former Smoker    Packs/day: 0.10    Years: 30.00    Pack years: 3.00    Types: Cigarettes    Quit date: 06/03/2015    Years since quitting: 3.6  . Smokeless tobacco: Never Used  . Tobacco comment: Pt. stated she stopped smoking a year ago. 09/29/2018  Substance Use Topics  . Alcohol use: No    Alcohol/week: 0.0 standard drinks  . Drug use: No     Allergies   Ace inhibitors   Review of Systems Review of Systems All other systems negative except as documented in the HPI. All pertinent positives and negatives as reviewed in the HPI.  Physical Exam Updated Vital Signs BP 98/72 (BP Location: Right Arm)   Pulse 84   Temp 98.7 F (37.1 C) (Oral)   Resp 16   SpO2 100%   Physical Exam Vitals signs and nursing note reviewed.  Constitutional:      General: She is not in acute distress.    Appearance: She is well-developed.  HENT:     Head: Normocephalic and atraumatic.  Eyes:     Pupils: Pupils are equal, round, and  reactive to light.  Neck:     Musculoskeletal: Normal range of motion and neck supple.  Cardiovascular:     Rate and Rhythm: Normal rate and regular rhythm.     Heart sounds: Normal heart sounds. No murmur. No friction rub. No gallop.   Pulmonary:     Effort: Pulmonary effort is normal. No respiratory distress.     Breath sounds: Normal breath sounds. No decreased breath sounds, wheezing or rhonchi.  Chest:     Chest wall: Tenderness present.  Abdominal:     General: Bowel sounds are normal. There is no distension.     Palpations: Abdomen is soft.     Tenderness: There is no abdominal tenderness.  Skin:    General: Skin is warm and dry.     Capillary Refill: Capillary refill takes less than 2 seconds.     Findings: No erythema or rash.  Neurological:     Mental Status: She is alert and oriented to person, place, and time.     Motor: No abnormal muscle tone.     Coordination: Coordination normal.  Psychiatric:        Behavior: Behavior normal.      ED Treatments / Results  Labs (all labs ordered are listed, but only abnormal results are displayed) Labs Reviewed  BASIC METABOLIC PANEL - Abnormal; Notable for the following components:      Result Value   Glucose, Bld 174 (*)    Creatinine, Ser 1.11 (*)    GFR calc non Af Amer 57 (*)    All other components within normal limits  CBC  I-STAT BETA HCG BLOOD, ED ([facility], [facility], [facility] ONLY)  TROPONIN I (HIGH SENSITIVITY)  TROPONIN I (HIGH SENSITIVITY)    EKG EKG Interpretation  Date/Time:  Wednesday February 07 2019 07:59:57 EDT Ventricular  Rate:  78 PR Interval:  174 QRS Duration: 88 QT Interval:  374 QTC Calculation: 426 R Axis:   51 Text Interpretation:  Normal sinus rhythm Normal ECG No significant change since last tracing Confirmed by Isla Pence [phone]) on 02/07/2019 10:32:44 AM   Radiology Dg Chest 2 View  Result Date: 02/07/2019 CLINICAL DATA:  Right-sided chest pain and nausea. Congestive heart failure.  Cardiomyopathy. Previous myocardial infarct. EXAM: CHEST - 2 VIEW COMPARISON:  05/24/2018 FINDINGS: The heart size and mediastinal contours are within normal limits. Both lungs are clear. The visualized skeletal structures are unremarkable. IMPRESSION: No active cardiopulmonary disease. Electronically Signed   By: Marlaine Hind M.D.   On: 02/07/2019 08:30    Procedures Procedures (including critical care time)  Medications Ordered in ED Medications  sodium chloride flush (NS) 0.9 % injection 3 mL (0 mLs Intravenous Hold 02/07/19 1031)     Initial Impression / Assessment and Plan / ED Course  I have reviewed the triage vital signs and the nursing notes.  Pertinent labs & imaging results that were available during my care of the patient were reviewed by me and considered in my medical decision making (see chart for details).       Patient has right-sided chest pain that is somewhat reproducible.  Patient states that nothing seems to make her condition better or worse.  She states she has had no other symptoms associated with this chest pain.  Patient was concerned that this could be an exacerbation of her bronchitis patient is not wheezing on examination.  Patient has not had a significant cough.  I spoke with the patient's cardiology group and they will call her with an appointment for follow-up.  Patient is advised of her results and all questions were answered.  At this point I feel the patient's chest pain is noncardiac in nature.  This is based on her HPI along with the results of her testing.  Final Clinical Impressions(s) / ED Diagnoses   Final diagnoses:  None    ED Discharge Orders    None       Rebeca Allegra 02/07/19 [place], Julie, MD 02/07/19 [address] [place], PA-C 02/07/19 1225    Isla Pence, MD 02/07/19 1313

## 2019-02-07 NOTE — Discharge Instructions (Addendum)
Return here as needed.  Your cardiologist will call you with a follow-up appointment.  Your testing here today did not show any significant abnormalities.

## 2019-02-08 ENCOUNTER — Telehealth: Payer: Self-pay

## 2019-02-08 ENCOUNTER — Telehealth: Payer: Self-pay | Admitting: Physician Assistant

## 2019-02-08 NOTE — Telephone Encounter (Signed)
I tried reaching patient, but mailbox full.  9/17

## 2019-02-08 NOTE — Telephone Encounter (Signed)
New message:    patient calling stating that she went to the ER on yesterday and stating they told her to follow up but states she is not feeling well and having some SOB and patient is also weak.

## 2019-02-08 NOTE — Telephone Encounter (Signed)
I spoke to the patient and she will keep her appointment on 9/22 with Scott in the office, because she does not want Virtual visit.  I told her that if pain and SOB worsens, she should go back to the ED.  She verbalized understanding.

## 2019-02-09 ENCOUNTER — Encounter

## 2019-02-13 ENCOUNTER — Other Ambulatory Visit: Payer: Self-pay

## 2019-02-13 ENCOUNTER — Ambulatory Visit (INDEPENDENT_AMBULATORY_CARE_PROVIDER_SITE_OTHER): Payer: Medicaid Other | Admitting: Physician Assistant

## 2019-02-13 ENCOUNTER — Encounter: Payer: Self-pay | Admitting: Physician Assistant

## 2019-02-13 VITALS — BP 90/78 | HR 77 | Ht 68.0 in | Wt 230.0 lb

## 2019-02-13 DIAGNOSIS — I110 Hypertensive heart disease with heart failure: Secondary | ICD-10-CM

## 2019-02-13 DIAGNOSIS — R0789 Other chest pain: Secondary | ICD-10-CM

## 2019-02-13 DIAGNOSIS — I5042 Chronic combined systolic (congestive) and diastolic (congestive) heart failure: Secondary | ICD-10-CM

## 2019-02-13 DIAGNOSIS — E785 Hyperlipidemia, unspecified: Secondary | ICD-10-CM

## 2019-02-13 MED ORDER — TORSEMIDE 20 MG PO TABS: 20.0000 mg | ORAL_TABLET | ORAL | 6 refills | Status: DC

## 2019-02-13 NOTE — Patient Instructions (Signed)
Medication Instructions:   Your physician has recommended you make the following change in your medication:   1) Decrease Torsemide to 40MG , 1 tablet by mouth on Monday, Wednesday, and Friday. Take 20MG , 1 tablet by mouth all other days; Tuesday, Thursday, Saturday, and Sunday.  If you need a refill on your cardiac medications before your next appointment, please call your pharmacy.   Lab work:  Your physician recommends that you return for lab work on: 02/20/19 at 2:30PM for a BMET  If you have labs (blood work) drawn today and your tests are completely normal, you will receive your results only by: Marland Kitchen MyChart Message (if you have MyChart) OR . A paper copy in the mail If you have any lab test that is abnormal or we need to change your treatment, we will call you to review the results.  Testing/Procedures:  None ordered today  Follow-Up:  On 03/06/19 at 3:15PM with Richardson Dopp, PA   Any Other Special Instructions Will Be Listed Below (If Applicable).  Weigh your self daily, if your weight goes up 3 pounds or more call the office.

## 2019-02-13 NOTE — Progress Notes (Signed)
Cardiology Office Note:    Date:  02/13/2019   ID:  Darlin Dressel, DOB 04/24/1966, MRN [id number]  PCP:  Charlott Rakes, MD  Cardiologist:  Virl Axe, MD   Electrophysiologist:  None   Referring MD: Charlott Rakes, MD   Chief Complaint  Patient presents with   Hospitalization Follow-up    Recent ED visit with chest pain    History of Present Illness:    Chelsea Branch is a 53 y.o. female with:  Chronic combined systolic and diastolic CHF  Peripartum CM / non-ischemic cardiomyopathy   LHC 9/04: EF 27  Echo 3/15: EF 25-30  Echo 8/15: EF 50-55  Echo 1/17: EF 25-30  s/p NSTEMI 05/2015 >> Cath:  Normal coronary arteries (?related to accelerated HTN)  Echo 1/18: EF 30-35  Echo 11/18: EF 40-45  Echo 06/2018: EF 60-65  Hypertensive Heart Dz   Hyperlipidemia  Prior stroke  Sleep apnea  Tobacco use  Ms. Burlison suffered a non-ST elevation myocardial infarction in 2017 in the setting of accelerated hypertension.  Cardiac catheterization at that time demonstrated normal coronary arteries.  Her ejection fraction had gone down to 25-30%.  Since then, her ejection fraction has improved and most recently in February 2020 it was normal at 60-65%.  She was last seen by Dr. Caryl Comes via telemedicine in May 2020.  She recently went to the emergency room 02/07/2019 with chest pain.  High-sensitivity troponin was negative x2.  EKG was personally reviewed and demonstrated normal sinus rhythm, HR 76, normal axis, QTc 426 ms, no acute changes.  Outpatient cardiology follow-up was recommended.  She returns for follow-up.  She notes a couple of episodes of chest discomfort that prompted her visit to the emergency room.  These only lasted a few minutes.  They were not exacerbated by exertion.  She did not have any unusual meals prior to this.  She had no issues with belching or dysphagia.  She took ibuprofen with relief.  She has not had significant shortness of breath.  She has not had PND.   She has not noticed significant leg swelling.  She does note significant fatigue and dizziness.  She especially notes this when she stands.  She has not had syncope.  Prior CV studies:   The following studies were reviewed today:  Holter monitor 07/07/2018 Sinus rhythm, mean heart rate 76, no significant arrhythmias  Echocardiogram 07/07/2018 EF 60-65, moderate LVH, grade 2 diastolic dysfunction  Echocardiogram 03/29/2017 Mild concentric LVH, EF 123XX123, grade 1 diastolic dysfunction, mild LAE  Echocardiogram 06/15/2016 Mild LVH, EF 30-35, diffuse HK, grade 1 diastolic dysfunction, mild LAE  Cardiac catheterization 06/16/2015 Normal coronary arteries EF 25  There is severe left ventricular systolic dysfunction.  No significant CAD.  LVEDP elevated. Nonischemic cardiomyopathy.  Continue medical therapy.   [name].  Consider diuresis.  Echo 06/15/2015 Moderate LVH, EF 25-30, diffuse HK, moderate MR, moderate LAE  Echo (8/15):   Mod LVH, EF 50-55%  Echo (3/15):   Mod LVH, EF 25-30%, mild MR  LHC (9/04):   EF 27%, no significant CAD (mild irregs in RCA)    Past Medical History:  Diagnosis Date   Abnormal thyroid blood test 03/15/2017   Abnormal uterine bleeding    Alopecia    Anemia    Angina pectoris with normal coronary arteriogram ([place]) 2017   Had + Troponin c/w ? 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 iniatially ?2/2 peripartum in 1994 - improved by 2008 then worsening EF in 2011 back down to EF 25-30%. b. echo 01/21/14 showed mod LVH, [name]; [name], [name], [name],    Nonischemic  dilated cardiomyopathy ([place]) 06/17/2015   NSTEMI (non-ST elevated myocardial infarction) ([place]) 05/2015   Normal Coronaries.   Peripartum cardiomyopathy 1994   Sleep apnea 2015   CPAP 12/2013   Stroke [facility]) [id number]   Systolic and diastolic CHF, acute on chronic ([place]) 06/14/2015   Tobacco abuse    Surgical Hx: The patient  has a past surgical history that includes Loop recorder implant (~ 2000); Tibial tuberclerplasty; Cesarean section (1992  1994); Tubal ligation (1994); and Cardiac catheterization (N/A, 06/16/2015).   Current Medications: Current Meds  Medication Sig   acetaminophen (TYLENOL) 500 MG tablet Take 1 tablet (500 mg total) by mouth every 6 (six) hours as needed.   acetaminophen-codeine (TYLENOL #3) 300-30 MG tablet Take 1 tablet by mouth every 12 (twelve) hours as needed for moderate pain.   albuterol (VENTOLIN HFA) 108 (90 Base) MCG/ACT inhaler Inhale 1-2 puffs into the lungs every 6 (six) hours as needed for wheezing or shortness of breath.   aspirin 81 MG EC tablet Take 1 tablet (81 mg total) by mouth daily.   Biotin 10000 MCG TABS Take 1 tablet by mouth daily.   cyclobenzaprine (FLEXERIL) 10 MG tablet Take 1 tablet (10 mg total) by mouth 2 (two) times daily as needed for muscle spasms.   diclofenac sodium (VOLTAREN) 1 % GEL Apply 4 g topically 4 (four) times daily.   DULoxetine (CYMBALTA) 30 MG capsule Take 1 capsule (30 mg total) by mouth daily.   ENTRESTO 97-103 MG TAKE 1 TABLET BY MOUTH TWICE A DAY   famotidine (PEPCID) 20 MG tablet Take 1 tablet (20 mg total) 2 (two) times daily by mouth.   gabapentin (NEURONTIN) 300 MG capsule Take 1 capsule (300 mg total) by mouth at bedtime.   guaiFENesin-codeine 100-10 MG/5ML syrup Take 5 mLs by mouth 3 (three) times daily as needed for cough.   ibuprofen (ADVIL) 800 MG tablet Take 1 tablet (800 mg total) by mouth every 12 (twelve) hours as needed.   KLOR-CON M20 20 MEQ tablet TAKE 2 TABLETS (40 MEQ TOTAL) BY MOUTH 2  (TWO) TIMES DAILY.   meclizine (ANTIVERT) 25 MG tablet Take 1 tablet (25 mg total) by mouth 2 (two) times daily as needed for dizziness.   metoprolol succinate (TOPROL-XL) 50 MG 24 hr tablet Take 75 mg 1 and a half tablets daily (Patient taking differently: Take 75 mg tablet by mouth daily)   Misc. Devices MISC Please provide patient with insurance approved straight cane   Misc. Devices MISC Single-point cane with adjustable height x1, shower chair x1, transfer bench x1.  Diagnosis congestive heart failure, degenerative disc disease of the lumbar spine.   simvastatin (ZOCOR) 10 MG tablet TAKE 1 TABLET BY MOUTH EVERY DAY IN THE EVENING   spironolactone (ALDACTONE) 25 MG tablet TAKE 0.5 TABLETS (12.5 MG TOTAL) BY MOUTH DAILY.   [DISCONTINUED] torsemide (DEMADEX) 20 MG tablet Take 2 tablets (40 mg total) by mouth daily.  Allergies:   Ace inhibitors   Social History   Tobacco Use   Smoking status: Former Smoker    Packs/day: 0.10    Years: 30.00    Pack years: 3.00    Types: Cigarettes    Quit date: 06/03/2015    Years since quitting: 3.7   Smokeless tobacco: Never Used   Tobacco comment: Pt. stated she stopped smoking a year ago. 09/29/2018  Substance Use Topics   Alcohol use: No    Alcohol/week: 0.0 standard drinks   Drug use: No     Family Hx: The patient's family history includes Cancer in her maternal grandmother; Hypertension in her sister. There is no history of Other or Heart disease.  ROS:   Please see the history of present illness.    Review of Systems  Gastrointestinal: Negative for hematochezia.  Genitourinary: Negative for hematuria.   All other systems reviewed and are negative.   EKGs/Labs/Other Test Reviewed:    EKG:  EKG is not ordered today.  The ekg done in the ED 02/07/2019 was personally reviewed (see HPI).   Recent Labs: 06/26/2018: TSH 0.517 02/07/2019: BUN 14; Creatinine, Ser 1.11; Hemoglobin 13.9; Platelets 257; Potassium 3.5; Sodium 138     Recent Lipid Panel Lab Results  Component Value Date/Time   CHOL 135 06/15/2015 04:50 AM   TRIG 53 06/15/2015 04:50 AM   HDL 35 (L) 06/15/2015 04:50 AM   CHOLHDL 3.9 06/15/2015 04:50 AM   LDLCALC 89 06/15/2015 04:50 AM    Physical Exam:    VS:  BP 90/78    Pulse 77    Ht 5\' 8"  (1.727 m)    Wt 230 lb (104.3 kg)    SpO2 98%    BMI 34.97 kg/m     Wt Readings from Last 3 Encounters:  02/13/19 230 lb (104.3 kg)  12/07/18 229 lb 6.4 oz (104.1 kg)  10/19/18 200 lb (90.7 kg)     Physical Exam  Constitutional: She is oriented to person, place, and time. She appears well-developed and well-nourished. No distress.  HENT:  Head: Normocephalic and atraumatic.  Eyes: No scleral icterus.  Neck: No JVD present. No thyromegaly present.  Cardiovascular: Normal rate, regular rhythm and normal heart sounds.  No murmur heard. Pulmonary/Chest: Effort normal and breath sounds normal. She has no rales.  Abdominal: Soft. There is no hepatomegaly.  Musculoskeletal:        General: No edema.  Lymphadenopathy:    She has no cervical adenopathy.  Neurological: She is alert and oriented to person, place, and time.  Skin: Skin is warm and dry.  Psychiatric: She has a normal mood and affect.    ASSESSMENT & PLAN:    1. Chronic combined systolic and diastolic CHF (congestive heart failure) (HCC) History of nonischemic cardiomyopathy.  EF has been as low as 25-30% in the past.  Cardiac catheterization in 2017 demonstrated normal coronary arteries.  Echocardiogram in February 2020 demonstrated normal LV function.  Overall, her volume status seems to be stable.  Her blood pressure is low and she is symptomatic with this.  I considered whether or not to decrease her dose of Entresto versus her dose of diuretic.  I think she can tolerate reducing her dose of diuretic first.  We will also provide her with a scale so that she can monitor her weight daily.  -Decrease torsemide to 40 mg every Monday,  Wednesday, Friday and 20 mg all other days  -Obtain BMET 1 week  -Keep follow-up  with me in October   2. Hypertensive heart disease with chronic combined systolic and diastolic congestive heart failure ([place]) As noted, her blood pressure is low when she is symptomatic.  Reduce torsemide as outlined above.  If her blood pressure remains low at follow-up, I will reduce her dose of Entresto.  3. Other chest pain Her chest discomfort is atypical for ischemia.  High-sensitivity troponin in the emergency room was negative.  Cardiac catheterization in 2017 demonstrated normal coronary arteries.  ECG in the emergency room demonstrated no acute changes.  No further CV testing is indicated at this time.  4. Hyperlipidemia, unspecified hyperlipidemia type Continue simvastatin.   Dispo:  Return in about 3 weeks (around 03/06/2019) for Close Follow Up w/ Dr. Caryl Comes, or Richardson Dopp, PA-C, or PA/NP on EP team.   Medication Adjustments/Labs and Tests Ordered: Current medicines are reviewed at length with the patient today.  Concerns regarding medicines are outlined above.  Tests Ordered: Orders Placed This Encounter  Procedures   Basic metabolic panel   Medication Changes: Meds ordered this encounter  Medications   torsemide (DEMADEX) 20 MG tablet    Sig: Take 1-2 tablets (20-40 mg total) by mouth as directed. Take 2 tablets(40MG ) by mouth on M/W/F and take 1 tablet(20MG ) by mouth all other days    Dispense:  45 tablet    Refill:  6    Signed, Richardson Dopp, PA-C  02/13/2019 3:55 PM    [facility] [address], [place], [place]  [address] Phone: [phone]; Fax: [phone]

## 2019-02-15 ENCOUNTER — Ambulatory Visit: Payer: Medicaid Other | Attending: Family Medicine | Admitting: Physical Therapy

## 2019-02-20 ENCOUNTER — Other Ambulatory Visit: Payer: Medicaid Other

## 2019-03-02 ENCOUNTER — Other Ambulatory Visit: Payer: Self-pay | Admitting: Family Medicine

## 2019-03-02 DIAGNOSIS — L659 Nonscarring hair loss, unspecified: Secondary | ICD-10-CM

## 2019-03-06 ENCOUNTER — Other Ambulatory Visit: Payer: Self-pay

## 2019-03-06 ENCOUNTER — Encounter: Payer: Self-pay | Admitting: Physician Assistant

## 2019-03-06 ENCOUNTER — Ambulatory Visit (INDEPENDENT_AMBULATORY_CARE_PROVIDER_SITE_OTHER): Payer: Medicaid Other | Admitting: Physician Assistant

## 2019-03-06 VITALS — BP 102/72 | HR 104 | Ht 68.0 in | Wt 229.2 lb

## 2019-03-06 DIAGNOSIS — I5042 Chronic combined systolic (congestive) and diastolic (congestive) heart failure: Secondary | ICD-10-CM

## 2019-03-06 DIAGNOSIS — E785 Hyperlipidemia, unspecified: Secondary | ICD-10-CM | POA: Diagnosis not present

## 2019-03-06 DIAGNOSIS — I110 Hypertensive heart disease with heart failure: Secondary | ICD-10-CM | POA: Diagnosis not present

## 2019-03-06 MED ORDER — ENTRESTO 49-51 MG PO TABS: 1.0000 | ORAL_TABLET | Freq: Two times a day (BID) | ORAL | 11 refills | Status: DC

## 2019-03-06 MED ORDER — TORSEMIDE 20 MG PO TABS: 20.0000 mg | ORAL_TABLET | Freq: Every day | ORAL | 3 refills | Status: DC

## 2019-03-06 MED ORDER — POTASSIUM CHLORIDE CRYS ER 20 MEQ PO TBCR: 20.0000 meq | EXTENDED_RELEASE_TABLET | ORAL | 3 refills | Status: DC

## 2019-03-06 NOTE — Progress Notes (Signed)
Cardiology Office Note:    Date:  03/06/2019   ID:  Chelsea Branch, DOB 15-May-1966, MRN [id number]  PCP:  Charlott Rakes, MD  Cardiologist:  Virl Axe, MD  Electrophysiologist:  None   Referring MD: Charlott Rakes, MD   Chief Complaint  Patient presents with  . Follow-up    CHF    History of Present Illness:    Chelsea Branch is a 53 y.o. female with:   Chronic combined systolic and diastolic CHF ? Peripartum CM / non-ischemic cardiomyopathy  ? LHC 9/04: EF 27 ? Echo 3/15: EF 25-30 ? Echo 8/15: EF 50-55 ? Echo 1/17: EF 25-30 ? s/p NSTEMI 05/2015 >> Cath:  Normal coronary arteries (?related to accelerated HTN) ? Echo 1/18: EF 30-35 ? Echo 11/18: EF 40-45 ? Echo 06/2018: EF 60-65  Hypertensive Heart Dz   Hyperlipidemia  Prior stroke  Sleep apnea  Tobacco use  Chelsea Branch was last seen via telemedicine 02/13/2019.  She had recently been to the emergency room with chest pain.  High-sensitivity troponins were negative x2.  She had no acute changes on EKG.  Her blood pressure was running low at her follow-up visit and I reduced her diuretic.  She returns for follow-up.She is here alone.  She continues to have episodes of dizziness and dyspnea.  She has not had significant weight gain.  She has mild ankle edema.  She has not had chest pain.  She sleeps on 2 pillows.  She has not had syncope.   Prior CV studies:   The following studies were reviewed today:  Holter monitor 07/07/2018 Sinus rhythm, mean heart rate 76, no significant arrhythmias  Echocardiogram 07/07/2018 EF 60-65, moderate LVH, grade 2 diastolic dysfunction  Echocardiogram 03/29/2017 Mild concentric LVH, EF 123XX123, grade 1 diastolic dysfunction, mild LAE  Echocardiogram 06/15/2016 Mild LVH, EF 30-35, diffuse HK, grade 1 diastolic dysfunction, mild LAE  Cardiac catheterization 06/16/2015 Normal coronary arteries EF 25  There is severe left ventricular systolic dysfunction.  No significant CAD.   LVEDP elevated. Nonischemic cardiomyopathy. Continue medical therapy.  [name]. Consider diuresis.  Echo 06/15/2015 Moderate LVH, EF 25-30, diffuse HK, moderate MR, moderate LAE  Echo (8/15):  Mod LVH, EF 50-55%  Echo (3/15):  Mod LVH, EF 25-30%, mild MR  LHC (9/04):  EF 27%, no significant CAD (mild irregs in RCA)  Past Medical History:  Diagnosis Date  . Abnormal thyroid blood test 03/15/2017  . Abnormal uterine bleeding   . Alopecia   . Anemia   . Angina pectoris with normal coronary arteriogram ([place]) 2017   Had + Troponin c/w ? NSTEMI due to A on C CHF  . 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 Chronic combined systolic and diastolic CHF (congestive heart failure) ([place])   . DYSLIPIDEMIA   . Essential hypertension   . Fibroids   . H/O noncompliance with medical treatment, presenting hazards to health   . Hypertension   . Hypokalemia 07/23/2016  . Menorrhagia   . Nonischemic cardiomyopathy ([place]) 1994; 2017   a. iniatially ?2/2 peripartum in 1994 - improved by 2008 then worsening EF in 2011 back down to EF 25-30%. b. echo 01/21/14 showed mod LVH, [name]; [name], [name], [name],   . Nonischemic dilated cardiomyopathy ([place]) 06/17/2015  . NSTEMI (non-ST elevated myocardial infarction) ([place]) 05/2015   Normal Coronaries.  . Peripartum cardiomyopathy 1994  . Sleep apnea 2015   CPAP 12/2013  . Stroke [facility]) 2011  . Systolic  and diastolic CHF, acute on chronic ([place]) 06/14/2015  . Tobacco abuse    Surgical Hx: The patient  has a past surgical history that includes Loop recorder implant (~ 2000); Tibial tuberclerplasty; Cesarean section (1992  1994); Tubal ligation (1994); and Cardiac catheterization (N/A, 06/16/2015).   Current Medications: Current Meds  Medication Sig  . acetaminophen (TYLENOL) 500 MG tablet Take 1 tablet (500 mg total) by mouth every 6 (six) hours as needed.  Marland Kitchen acetaminophen-codeine (TYLENOL #3) 300-30 MG tablet Take 1  tablet by mouth every 12 (twelve) hours as needed for moderate pain.  Marland Kitchen albuterol (VENTOLIN HFA) 108 (90 Base) MCG/ACT inhaler Inhale 1-2 puffs into the lungs every 6 (six) hours as needed for wheezing or shortness of breath.  Marland Kitchen aspirin 81 MG EC tablet Take 1 tablet (81 mg total) by mouth daily.  . cyclobenzaprine (FLEXERIL) 10 MG tablet Take 1 tablet (10 mg total) by mouth 2 (two) times daily as needed for muscle spasms.  . diclofenac sodium (VOLTAREN) 1 % GEL Apply 4 g topically 4 (four) times daily.  . famotidine (PEPCID) 20 MG tablet Take 1 tablet (20 mg total) 2 (two) times daily by mouth.  . gabapentin (NEURONTIN) 300 MG capsule Take 1 capsule (300 mg total) by mouth at bedtime.  Marland Kitchen ibuprofen (ADVIL) 800 MG tablet Take 1 tablet (800 mg total) by mouth every 12 (twelve) hours as needed.  . meclizine (ANTIVERT) 25 MG tablet Take 1 tablet (25 mg total) by mouth 2 (two) times daily as needed for dizziness.  . metoprolol succinate (TOPROL-XL) 50 MG 24 hr tablet Take 75 mg 1 and a half tablets daily (Patient taking differently: Take 75 mg tablet by mouth daily)  . Misc. Devices MISC Please provide patient with insurance approved straight cane  . Misc. Devices MISC Single-point cane with adjustable height x1, shower chair x1, transfer bench x1.  Diagnosis congestive heart failure, degenerative disc disease of the lumbar spine.  . potassium chloride SA (KLOR-CON M20) 20 MEQ tablet Take 1-2 tablets (20-40 mEq total) by mouth as directed. Take 2 tablets in the morning (40 Meq) and 1 tablet (20 meq)  in the evening  . simvastatin (ZOCOR) 10 MG tablet TAKE 1 TABLET BY MOUTH EVERY DAY IN THE EVENING  . spironolactone (ALDACTONE) 25 MG tablet TAKE 0.5 TABLETS (12.5 MG TOTAL) BY MOUTH DAILY.  Marland Kitchen torsemide (DEMADEX) 20 MG tablet Take 1 tablet (20 mg total) by mouth daily. Take 1 tablet by mouth once a day  . [DISCONTINUED] ENTRESTO 97-103 MG TAKE 1 TABLET BY MOUTH TWICE A DAY  . [DISCONTINUED] KLOR-CON M20 20  MEQ tablet TAKE 2 TABLETS (40 MEQ TOTAL) BY MOUTH 2 (TWO) TIMES DAILY.  . [DISCONTINUED] potassium chloride SA (KLOR-CON M20) 20 MEQ tablet Take 1 tablet (20 mEq total) by mouth as directed. Take 2 tablets in the morning (40 Meq) and 1 tablet (20 meq)  in the evening  . [DISCONTINUED] torsemide (DEMADEX) 20 MG tablet Take 1-2 tablets (20-40 mg total) by mouth as directed. Take 2 tablets(40MG ) by mouth on M/W/F and take 1 tablet(20MG ) by mouth all other days     Allergies:   Ace inhibitors   Social History   Tobacco Use  . Smoking status: Former Smoker    Packs/day: 0.10    Years: 30.00    Pack years: 3.00    Types: Cigarettes    Quit date: 06/03/2015    Years since quitting: 3.7  . Smokeless tobacco: Never Used  .  Tobacco comment: Pt. stated she stopped smoking a year ago. 09/29/2018  Substance Use Topics  . Alcohol use: No    Alcohol/week: 0.0 standard drinks  . Drug use: No     Family Hx: The patient's family history includes Cancer in her maternal grandmother; Hypertension in her sister. There is no history of Other or Heart disease.  ROS:   Please see the history of present illness.    Review of Systems  Gastrointestinal: Negative for hematochezia and melena.  Genitourinary: Negative for hematuria.   All other systems reviewed and are negative.   EKGs/Labs/Other Test Reviewed:    EKG:  EKG is  ordered today.  The ekg ordered today demonstrates sinus tachycardia, HR 106, normal axis, PVC, nonspecific ST-T wave changes, QTC 467, similar to prior tracings  Recent Labs: 06/26/2018: TSH 0.517 02/07/2019: BUN 14; Creatinine, Ser 1.11; Hemoglobin 13.9; Platelets 257; Potassium 3.5; Sodium 138   Recent Lipid Panel Lab Results  Component Value Date/Time   CHOL 135 06/15/2015 04:50 AM   TRIG 53 06/15/2015 04:50 AM   HDL 35 (L) 06/15/2015 04:50 AM   CHOLHDL 3.9 06/15/2015 04:50 AM   LDLCALC 89 06/15/2015 04:50 AM    Physical Exam:    VS:  BP 102/72   Pulse (!) 104   Ht  5\' 8"  (1.727 m)   Wt 229 lb 3.2 oz (104 kg)   SpO2 96%   BMI 34.85 kg/m     Wt Readings from Last 3 Encounters:  03/06/19 229 lb 3.2 oz (104 kg)  02/13/19 230 lb (104.3 kg)  12/07/18 229 lb 6.4 oz (104.1 kg)     Physical Exam  Constitutional: She is oriented to person, place, and time. She appears well-developed and well-nourished. No distress.  HENT:  Head: Normocephalic and atraumatic.  Eyes: No scleral icterus.  Neck: No JVD present. No thyromegaly present.  Cardiovascular: Normal rate and regular rhythm.  No murmur heard. Pulmonary/Chest: [place] normal. She has no rales.  Abdominal: Soft.  Musculoskeletal:        General: No edema.  Lymphadenopathy:    She has no cervical adenopathy.  Neurological: She is alert and oriented to person, place, and time.  Skin: Skin is warm and dry.  Psychiatric: She has a normal mood and affect.    ASSESSMENT & PLAN:    1. Chronic combined systolic and diastolic CHF (congestive heart failure) (HCC) History of nonischemic cardiomyopathy.  EF has been as low as 25-30%.  Recent echocardiogram in February 2020 demonstrated normal LV function.  She continues to have issues with symptomatic hypotension.  She presses shirts at work.  The environment is quite hot and she does sweat quite a bit.  Therefore, I have recommended that we continue to decrease her dose of diuretic.  I will also decrease her dose of Entresto.  Of note, I suspect her heart rate is up today secondary to low blood pressure.  -Decrease torsemide to 20 mg daily  -Decrease potassium to 40 mEq in the morning, 20 mEq in the afternoon  -Decrease Entresto to 49/51 mg twice daily  -BMET, CBC in 1 week  -Follow-up with me in 6 to 8 weeks  2. Hypertensive heart disease with chronic combined systolic and diastolic congestive heart failure ([place]) As noted, her blood pressure is running low.  She is symptomatic with this.  Adjust medications as outlined above.  3. Hyperlipidemia,  unspecified hyperlipidemia type Continue statin therapy.   Dispo:  Return in about 8 weeks (  around 05/01/2019) for Routine Follow Up, w/ Richardson Dopp, PA-C, (virtual or in-person).   Medication Adjustments/Labs and Tests Ordered: Current medicines are reviewed at length with the patient today.  Concerns regarding medicines are outlined above.  Tests Ordered: Orders Placed This Encounter  Procedures  . Basic metabolic panel  . CBC  . EKG 12-Lead   Medication Changes: Meds ordered this encounter  Medications  . sacubitril-valsartan (ENTRESTO) 49-51 MG    Sig: Take 1 tablet by mouth 2 (two) times daily.    Dispense:  60 tablet    Refill:  11  . DISCONTD: potassium chloride SA (KLOR-CON M20) 20 MEQ tablet    Sig: Take 1 tablet (20 mEq total) by mouth as directed. Take 2 tablets in the morning (40 Meq) and 1 tablet (20 meq)  in the evening    Dispense:  270 tablet    Refill:  3  . torsemide (DEMADEX) 20 MG tablet    Sig: Take 1 tablet (20 mg total) by mouth daily. Take 1 tablet by mouth once a day    Dispense:  90 tablet    Refill:  3  . potassium chloride SA (KLOR-CON M20) 20 MEQ tablet    Sig: Take 1-2 tablets (20-40 mEq total) by mouth as directed. Take 2 tablets in the morning (40 Meq) and 1 tablet (20 meq)  in the evening    Dispense:  270 tablet    Refill:  3    Signed, Richardson Dopp, PA-C  03/06/2019 [facility] [place], [place], [place]  [address] Phone: [phone]; Fax: [phone]

## 2019-03-06 NOTE — Patient Instructions (Signed)
Medication Instructions:  1.) DECREASE: Potassium to 2 tablets in the morning (40 meq) and 1 tablet in the evening (20 meq)  2.) DECREASE: Entresto to 49/51 mg twice a day   3.) DECREASE: Torsemide to 20 mg once a day   If you need a refill on your cardiac medications before your next appointment, please call your pharmacy.   Lab work: FUTURE: BMET & CBC on 03/14/2019 (Lab is open from 7:30 am to 4:30 pm)  If you have labs (blood work) drawn today and your tests are completely normal, you will receive your results only by: Marland Kitchen MyChart Message (if you have MyChart) OR . A paper copy in the mail If you have any lab test that is abnormal or we need to change your treatment, we will call you to review the results.  Testing/Procedures: None   Follow-Up: You are scheduled to see Richardson Dopp PA-C on 04/17/2019 @ 3:15 PM   Any Other Special Instructions Will Be Listed Below (If Applicable).

## 2019-03-07 ENCOUNTER — Telehealth: Payer: Self-pay | Admitting: Licensed Clinical Social Worker

## 2019-03-07 DIAGNOSIS — M545 Low back pain: Secondary | ICD-10-CM | POA: Diagnosis not present

## 2019-03-07 NOTE — Telephone Encounter (Signed)
CSW referred to assist patient with obtaining a BP cuff. CSW contacted patient to inform cuff will be delivered to home. Patient grateful for support and assistance. CSW available as needed. Jackie Hurschel Paynter, LCSW, CCSW-MCS [phone]  

## 2019-03-14 ENCOUNTER — Other Ambulatory Visit: Payer: Medicaid Other

## 2019-03-14 DIAGNOSIS — M5126 Other intervertebral disc displacement, lumbar region: Secondary | ICD-10-CM | POA: Diagnosis not present

## 2019-03-14 DIAGNOSIS — M48061 Spinal stenosis, lumbar region without neurogenic claudication: Secondary | ICD-10-CM | POA: Diagnosis not present

## 2019-03-14 DIAGNOSIS — M5136 Other intervertebral disc degeneration, lumbar region: Secondary | ICD-10-CM | POA: Diagnosis not present

## 2019-03-14 DIAGNOSIS — M47816 Spondylosis without myelopathy or radiculopathy, lumbar region: Secondary | ICD-10-CM | POA: Diagnosis not present

## 2019-03-14 DIAGNOSIS — M545 Low back pain: Secondary | ICD-10-CM | POA: Diagnosis not present

## 2019-04-17 ENCOUNTER — Ambulatory Visit: Payer: Medicaid Other | Admitting: Physician Assistant

## 2019-04-17 NOTE — Progress Notes (Deleted)
Cardiology Office Note:    Date:  04/17/2019   ID:  Chelsea Branch, DOB Feb 10, 1966, MRN [id number]  PCP:  Chelsea Rakes, MD  Cardiologist:  Virl Axe, MD *** Electrophysiologist:  None   Referring MD: Chelsea Rakes, MD   No chief complaint on file. ***  History of Present Illness:    Chelsea Branch is a 53 y.o. female with:   Chronic combined systolic and diastolic CHF ? Peripartum CM / non-ischemic cardiomyopathy ? LHC 9/04: EF 27 ? Echo 3/15: EF 25-30 ? Echo 8/15: EF 50-55 ? Echo 1/17: EF 25-30 ? s/p NSTEMI 05/2015 >> Cath:Normal coronary arteries (?related to accelerated HTN) ? Echo 1/18: EF 30-35 ? Echo 11/18: EF 40-45 ? Echo 06/2018: EF 60-65  Hypertensive Heart Dz  Hyperlipidemia  Prior stroke  Sleep apnea  Tobacco use  Chelsea Branch was last seen in 02/2019.  She was having issues with symptomatic hypotension and I reduced her diuretic and Entresto dose.    The DICTATELATER SmartLink is not supported in this context. ***   Prior CV studies:   The following studies were reviewed today:  *** Holter monitor 07/07/2018 Sinus rhythm, mean heart rate 76, no significant arrhythmias  Echocardiogram 07/07/2018 EF 60-65, moderate LVH, grade 2 diastolic dysfunction  Echocardiogram 03/29/2017 Mild concentric LVH, EF 123XX123, grade 1 diastolic dysfunction, mild LAE  Echocardiogram 06/15/2016 Mild LVH, EF 30-35, diffuse HK, grade 1 diastolic dysfunction, mild LAE  Cardiac catheterization 06/16/2015 Normal coronary arteries EF 25  There is severe left ventricular systolic dysfunction.  No significant CAD.  LVEDP elevated. Nonischemic cardiomyopathy. Continue medical therapy.  [name]. Consider diuresis.  Echo 06/15/2015 Moderate LVH, EF 25-30, diffuse HK, moderate MR, moderate LAE  Echo (8/15):  Mod LVH, EF 50-55%  Echo (3/15):  Mod LVH, EF 25-30%, mild MR  LHC (9/04):  EF 27%, no significant CAD (mild irregs in RCA)  Past  Medical History:  Diagnosis Date  . Abnormal thyroid blood test 03/15/2017  . Abnormal uterine bleeding   . Alopecia   . Anemia   . Angina pectoris with normal coronary arteriogram ([place]) 2017   Had + Troponin c/w ? NSTEMI due to A on C CHF  . 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 Chronic combined systolic and diastolic CHF (congestive heart failure) ([place])   . DYSLIPIDEMIA   . Essential hypertension   . Fibroids   . H/O noncompliance with medical treatment, presenting hazards to health   . Hypertension   . Hypokalemia 07/23/2016  . Menorrhagia   . Nonischemic cardiomyopathy ([place]) 1994; 2017   a. iniatially ?2/2 peripartum in 1994 - improved by 2008 then worsening EF in 2011 back down to EF 25-30%. b. echo 01/21/14 showed mod LVH, [name]; [name], [name], [name],   . 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 NSTEMI (non-ST elevated myocardial infarction) ([place]) 05/2015   Normal Coronaries.  . Peripartum cardiomyopathy 1994  . Sleep apnea 2015   CPAP 12/2013  . Stroke [facility]) 2011  . Systolic and diastolic CHF, acute on chronic ([place]) 06/14/2015  . Tobacco abuse    Surgical Hx: The patient  has a past surgical history that includes Loop recorder implant (~ 2000); Tibial tuberclerplasty; Cesarean section (1992  1994); Tubal ligation (1994); and Cardiac catheterization (N/A, 06/16/2015).   Current Medications: No outpatient medications have been marked as taking for the 04/17/19 encounter (Appointment) with Chelsea Dopp T, PA-C.     Allergies:   Ace  inhibitors   Social History   Tobacco Use  . Smoking status: Former Smoker    Packs/day: 0.10    Years: 30.00    Pack years: 3.00    Types: Cigarettes    Quit date: 06/03/2015    Years since quitting: 3.8  . Smokeless tobacco: Never Used  . Tobacco comment: Pt. stated she stopped smoking a year ago. 09/29/2018  Substance Use Topics  . Alcohol use: No    Alcohol/week: 0.0 standard drinks  .  Drug use: No     Family Hx: The patient's family history includes Cancer in her maternal grandmother; Hypertension in her sister. There is no history of Other or Heart disease.  ROS:   Please see the history of present illness.    ROS All other systems reviewed and are negative.   EKGs/Labs/Other Test Reviewed:    EKG:  EKG is *** ordered today.  The ekg ordered today demonstrates ***  Recent Labs: 06/26/2018: TSH 0.517 02/07/2019: BUN 14; Creatinine, Ser 1.11; Hemoglobin 13.9; Platelets 257; Potassium 3.5; Sodium 138   Recent Lipid Panel Lab Results  Component Value Date/Time   CHOL 135 06/15/2015 04:50 AM   TRIG 53 06/15/2015 04:50 AM   HDL 35 (L) 06/15/2015 04:50 AM   CHOLHDL 3.9 06/15/2015 04:50 AM   LDLCALC 89 06/15/2015 04:50 AM    Physical Exam:    VS:  There were no vitals taken for this visit.    Wt Readings from Last 3 Encounters:  03/06/19 229 lb 3.2 oz (104 kg)  02/13/19 230 lb (104.3 kg)  12/07/18 229 lb 6.4 oz (104.1 kg)     ***Physical Exam  ASSESSMENT & PLAN:    *** 1. Chronic combined systolic and diastolic CHF (congestive heart failure) (HCC) History of nonischemic cardiomyopathy.  EF has been as low as 25-30%.  Recent echocardiogram in February 2020 demonstrated normal LV function.  She continues to have issues with symptomatic hypotension.  She presses shirts at work.  The environment is quite hot and she does sweat quite a bit.  Therefore, I have recommended that we continue to decrease her dose of diuretic.  I will also decrease her dose of Entresto.  Of note, I suspect her heart rate is up today secondary to low blood pressure.             -Decrease torsemide to 20 mg daily             -Decrease potassium to 40 mEq in the morning, 20 mEq in the afternoon             -Decrease Entresto to 49/51 mg twice daily             -BMET, CBC in 1 week             -Follow-up with me in 6 to 8 weeks  2. Hypertensive heart disease with chronic combined  systolic and diastolic congestive heart failure ([place]) As noted, her blood pressure is running low.  She is symptomatic with this.  Adjust medications as outlined above.  3. Hyperlipidemia, unspecified hyperlipidemia type Continue statin therapy.  Dispo:  No follow-ups on file.   Medication Adjustments/Labs and Tests Ordered: Current medicines are reviewed at length with the patient today.  Concerns regarding medicines are outlined above.  Tests Ordered: No orders of the defined types were placed in this encounter.  Medication Changes: No orders of the defined types were placed in this encounter.   Signed, Nicki Reaper  Jorene Minors  04/17/2019 12:03 PM    [facility] [place], [place], [place]  [address] Phone: [phone]; Fax: [phone]

## 2019-04-18 DIAGNOSIS — Z6835 Body mass index (BMI) 35.0-35.9, adult: Secondary | ICD-10-CM | POA: Diagnosis not present

## 2019-04-18 DIAGNOSIS — I10 Essential (primary) hypertension: Secondary | ICD-10-CM | POA: Diagnosis not present

## 2019-04-18 DIAGNOSIS — M47816 Spondylosis without myelopathy or radiculopathy, lumbar region: Secondary | ICD-10-CM | POA: Diagnosis not present

## 2019-04-23 ENCOUNTER — Other Ambulatory Visit: Payer: Self-pay | Admitting: Family Medicine

## 2019-05-10 DIAGNOSIS — M47816 Spondylosis without myelopathy or radiculopathy, lumbar region: Secondary | ICD-10-CM | POA: Diagnosis not present

## 2019-05-16 ENCOUNTER — Ambulatory Visit: Payer: Medicaid Other | Admitting: Physician Assistant

## 2019-05-16 NOTE — Progress Notes (Deleted)
Cardiology Office Note:    Date:  05/16/2019   ID:  Chelsea Branch, DOB 05/07/1966, MRN [id number]  PCP:  [place] Rakes, MD  Cardiologist:  Virl Axe, MD *** Electrophysiologist:  None   Referring MD: [place] Rakes, MD   No chief complaint on file. ***  History of Present Illness:    Chelsea Branch is a 53 y.o. female with:   Chronic combined systolic and diastolic CHF ? Peripartum CM / non-ischemic cardiomyopathy ? LHC 9/04: EF 27 ? Echo 3/15: EF 25-30 ? Echo 8/15: EF 50-55 ? Echo 1/17: EF 25-30 ? s/p NSTEMI 05/2015 >> Cath:Normal coronary arteries (?related to accelerated HTN) ? Echo 1/18: EF 30-35 ? Echo 11/18: EF 40-45 ? Echo 06/2018: EF 60-65  Hypertensive Heart Dz  Hyperlipidemia  Prior stroke  Sleep apnea  Tobacco use  Ms. Roederer was last seen in October 2020.  She had been seen recently for symptomatic hypotension.  At last visit, I reduced her diuretic and Entresto dose further.  The DICTATELATER SmartLink is not supported in this context. ***   Prior CV studies:   The following studies were reviewed today:  *** Holter monitor 07/07/2018 Sinus rhythm, mean heart rate 76, no significant arrhythmias  Echocardiogram 07/07/2018 EF 60-65, moderate LVH, grade 2 diastolic dysfunction  Echocardiogram 03/29/2017 Mild concentric LVH, EF 123XX123, grade 1 diastolic dysfunction, mild LAE  Echocardiogram 06/15/2016 Mild LVH, EF 30-35, diffuse HK, grade 1 diastolic dysfunction, mild LAE  Cardiac catheterization 06/16/2015 Normal coronary arteries EF 25  There is severe left ventricular systolic dysfunction.  No significant CAD.  LVEDP elevated. Nonischemic cardiomyopathy. Continue medical therapy.  [name]. Consider diuresis.  Echo 06/15/2015 Moderate LVH, EF 25-30, diffuse HK, moderate MR, moderate LAE  Echo (8/15):  Mod LVH, EF 50-55%  Echo (3/15):  Mod LVH, EF 25-30%, mild MR  LHC (9/04):  EF 27%, no significant CAD (mild  irregs in RCA)  Past Medical History:  Diagnosis Date  . Abnormal thyroid blood test 03/15/2017  . Abnormal uterine bleeding   . Alopecia   . Anemia   . Angina pectoris with normal coronary arteriogram ([place]) 2017   Had + Troponin c/w ? NSTEMI due to A on C CHF  . 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 Chronic combined systolic and diastolic CHF (congestive heart failure) ([place])   . DYSLIPIDEMIA   . Essential hypertension   . Fibroids   . H/O noncompliance with medical treatment, presenting hazards to health   . Hypertension   . Hypokalemia 07/23/2016  . Menorrhagia   . Nonischemic cardiomyopathy ([place]) 1994; 2017   a. iniatially ?2/2 peripartum in 1994 - improved by 2008 then worsening EF in 2011 back down to EF 25-30%. b. echo 01/21/14 showed mod LVH, [name]; [name], [name], [name],   . 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 NSTEMI (non-ST elevated myocardial infarction) ([place]) 05/2015   Normal Coronaries.  . Peripartum cardiomyopathy 1994  . Sleep apnea 2015   CPAP 12/2013  . Stroke [facility]) 2011  . Systolic and diastolic CHF, acute on chronic ([place]) 06/14/2015  . Tobacco abuse    Surgical Hx: The patient  has a past surgical history that includes Loop recorder implant (~ 2000); Tibial tuberclerplasty; Cesarean section (1992  1994); Tubal ligation (1994); and Cardiac catheterization (N/A, 06/16/2015).   Current Medications: No outpatient medications have been marked as taking for the 05/16/19 encounter (Appointment) with Richardson Dopp T, PA-C.  Allergies:   Ace inhibitors   Social History   Tobacco Use  . Smoking status: Former Smoker    Packs/day: 0.10    Years: 30.00    Pack years: 3.00    Types: Cigarettes    Quit date: 06/03/2015    Years since quitting: 3.9  . Smokeless tobacco: Never Used  . Tobacco comment: Pt. stated she stopped smoking a year ago. 09/29/2018  Substance Use Topics  . Alcohol use: No    Alcohol/week: 0.0  standard drinks  . Drug use: No     Family Hx: The patient's family history includes Cancer in her maternal grandmother; Hypertension in her sister. There is no history of Other or Heart disease.  ROS:   Please see the history of present illness.    ROS All other systems reviewed and are negative.   EKGs/Labs/Other Test Reviewed:    EKG:  EKG is *** ordered today.  The ekg ordered today demonstrates ***  Recent Labs: 06/26/2018: TSH 0.517 02/07/2019: BUN 14; Creatinine, Ser 1.11; Hemoglobin 13.9; Platelets 257; Potassium 3.5; Sodium 138   Recent Lipid Panel Lab Results  Component Value Date/Time   CHOL 135 06/15/2015 04:50 AM   TRIG 53 06/15/2015 04:50 AM   HDL 35 (L) 06/15/2015 04:50 AM   CHOLHDL 3.9 06/15/2015 04:50 AM   LDLCALC 89 06/15/2015 04:50 AM    Physical Exam:    VS:  There were no vitals taken for this visit.    Wt Readings from Last 3 Encounters:  03/06/19 229 lb 3.2 oz (104 kg)  02/13/19 230 lb (104.3 kg)  12/07/18 229 lb 6.4 oz (104.1 kg)     ***Physical Exam  ASSESSMENT & PLAN:    *** 1. Chronic combined systolic and diastolic CHF (congestive heart failure) (HCC) History of nonischemic cardiomyopathy.  EF has been as low as 25-30%.  Recent echocardiogram in February 2020 demonstrated normal LV function.  She continues to have issues with symptomatic hypotension.  She presses shirts at work.  The environment is quite hot and she does sweat quite a bit.  Therefore, I have recommended that we continue to decrease her dose of diuretic.  I will also decrease her dose of Entresto.  Of note, I suspect her heart rate is up today secondary to low blood pressure.             -Decrease torsemide to 20 mg daily             -Decrease potassium to 40 mEq in the morning, 20 mEq in the afternoon             -Decrease Entresto to 49/51 mg twice daily             -BMET, CBC in 1 week             -Follow-up with me in 6 to 8 weeks  2. Hypertensive heart disease with  chronic combined systolic and diastolic congestive heart failure ([place]) As noted, her blood pressure is running low.  She is symptomatic with this.  Adjust medications as outlined above.  3. Hyperlipidemia, unspecified hyperlipidemia type Continue statin therapy.   Dispo:  No follow-ups on file.   Medication Adjustments/Labs and Tests Ordered: Current medicines are reviewed at length with the patient today.  Concerns regarding medicines are outlined above.  Tests Ordered: No orders of the defined types were placed in this encounter.  Medication Changes: No orders of the defined types were placed in this  encounter.   Signed, Richardson Dopp, PA-C  05/16/2019 8:52 AM    [facility] [place], [place], [place]  [address] Phone: [phone]; Fax: [phone]

## 2019-05-23 ENCOUNTER — Other Ambulatory Visit: Payer: Self-pay | Admitting: Internal Medicine

## 2019-06-10 ENCOUNTER — Encounter (HOSPITAL_COMMUNITY): Payer: Self-pay

## 2019-06-10 ENCOUNTER — Other Ambulatory Visit: Payer: Self-pay

## 2019-06-10 ENCOUNTER — Ambulatory Visit (HOSPITAL_COMMUNITY)
Admission: EM | Admit: 2019-06-10 | Discharge: 2019-06-10 | Disposition: A | Discharge status: Home / Self Care | Payer: Medicaid Other | Attending: Family Medicine | Admitting: Family Medicine

## 2019-06-10 DIAGNOSIS — Z8249 Family history of ischemic heart disease and other diseases of the circulatory system: Secondary | ICD-10-CM | POA: Diagnosis not present

## 2019-06-10 DIAGNOSIS — Z87891 Personal history of nicotine dependence: Secondary | ICD-10-CM | POA: Insufficient documentation

## 2019-06-10 DIAGNOSIS — I110 Hypertensive heart disease with heart failure: Secondary | ICD-10-CM | POA: Diagnosis not present

## 2019-06-10 DIAGNOSIS — G4733 Obstructive sleep apnea (adult) (pediatric): Secondary | ICD-10-CM | POA: Diagnosis not present

## 2019-06-10 DIAGNOSIS — M545 Low back pain, unspecified: Secondary | ICD-10-CM

## 2019-06-10 DIAGNOSIS — I10 Essential (primary) hypertension: Secondary | ICD-10-CM | POA: Insufficient documentation

## 2019-06-10 DIAGNOSIS — I5042 Chronic combined systolic (congestive) and diastolic (congestive) heart failure: Secondary | ICD-10-CM | POA: Diagnosis not present

## 2019-06-10 DIAGNOSIS — Z7982 Long term (current) use of aspirin: Secondary | ICD-10-CM | POA: Diagnosis not present

## 2019-06-10 DIAGNOSIS — I428 Other cardiomyopathies: Secondary | ICD-10-CM | POA: Insufficient documentation

## 2019-06-10 DIAGNOSIS — E876 Hypokalemia: Secondary | ICD-10-CM | POA: Diagnosis not present

## 2019-06-10 DIAGNOSIS — Z79899 Other long term (current) drug therapy: Secondary | ICD-10-CM | POA: Insufficient documentation

## 2019-06-10 DIAGNOSIS — I252 Old myocardial infarction: Secondary | ICD-10-CM | POA: Insufficient documentation

## 2019-06-10 DIAGNOSIS — G8929 Other chronic pain: Secondary | ICD-10-CM | POA: Diagnosis not present

## 2019-06-10 DIAGNOSIS — R52 Pain, unspecified: Secondary | ICD-10-CM | POA: Diagnosis not present

## 2019-06-10 DIAGNOSIS — E785 Hyperlipidemia, unspecified: Secondary | ICD-10-CM | POA: Insufficient documentation

## 2019-06-10 DIAGNOSIS — Z8673 Personal history of transient ischemic attack (TIA), and cerebral infarction without residual deficits: Secondary | ICD-10-CM | POA: Insufficient documentation

## 2019-06-10 DIAGNOSIS — Z20822 Contact with and (suspected) exposure to covid-19: Secondary | ICD-10-CM | POA: Insufficient documentation

## 2019-06-10 DIAGNOSIS — Z888 Allergy status to other drugs, medicaments and biological substances status: Secondary | ICD-10-CM | POA: Insufficient documentation

## 2019-06-10 LAB — COMPREHENSIVE METABOLIC PANEL
ALT: 26 U/L (ref 0–44)
AST: 23 U/L (ref 15–41)
Albumin: 3 g/dL — ABNORMAL LOW (ref 3.5–5.0)
Alkaline Phosphatase: 92 U/L (ref 38–126)
Anion gap: 14 (ref 5–15)
BUN: 18 mg/dL (ref 6–20)
CO2: 29 mmol/L (ref 22–32)
Calcium: 8.9 mg/dL (ref 8.9–10.3)
Chloride: 92 mmol/L — ABNORMAL LOW (ref 98–111)
Creatinine, Ser: 1.03 mg/dL — ABNORMAL HIGH (ref 0.44–1.00)
GFR calc Af Amer: >60 mL/min (ref >60)
GFR calc non Af Amer: >60 mL/min (ref >60)
Glucose, Bld: 203 mg/dL — ABNORMAL HIGH (ref 70–99)
Potassium: 2.8 mmol/L — ABNORMAL LOW (ref 3.5–5.1)
Sodium: 135 mmol/L (ref 135–145)
Total Bilirubin: 0.7 mg/dL (ref 0.3–1.2)
Total Protein: 7.2 g/dL (ref 6.5–8.1)

## 2019-06-10 LAB — MAGNESIUM: Magnesium: 1.4 mg/dL — ABNORMAL LOW (ref 1.7–2.4)

## 2019-06-10 MED ORDER — ALBUTEROL SULFATE HFA 108 (90 BASE) MCG/ACT IN AERS: 1.0000 | INHALATION_SPRAY | Freq: Four times a day (QID) | RESPIRATORY_TRACT | 0 refills | Status: DC | PRN

## 2019-06-10 MED ORDER — HYDROCODONE-ACETAMINOPHEN 7.5-325 MG PO TABS: 1.0000 | ORAL_TABLET | Freq: Four times a day (QID) | ORAL | 0 refills | Status: DC | PRN

## 2019-06-10 NOTE — Discharge Instructions (Addendum)
Take the pain medicine as needed Do not drive or work on the pain medicine as it may cause drowsiness This will not be refilled.  If you need more, call your PCP or spine doctors Quarantine until you get the covid result It is available through My [facility]

## 2019-06-10 NOTE — ED Provider Notes (Signed)
[place]    CSN: [id number] Arrival date & time: 06/10/19  1133      History   Chief Complaint Chief Complaint  Patient presents with  . Generalized Body Aches    HPI Chelsea Branch is a 54 y.o. female.   HPI  Patient states she is had progressively worsening body aches and cramping for the last 3 weeks.  She thinks it started after she got her steroid shots for her chronic low back pain.  She states that it hurts all across her neck and shoulders.  Down her back.  All across her low back.  Nothing specifically in the arms or legs.  She diffusely points to the front of her chest right and left and states the muscles in her chest hurt.  No exact exertional chest pain.  No palpitations.  No shortness of breath.  She states she has been taking her medications as prescribed. She does not have any fever or chills.  No headache.  No loss of taste or smell.  No loss of appetite.  No exposure to Covid  Past Medical History:  Diagnosis Date  . Abnormal thyroid blood test 03/15/2017  . Abnormal uterine bleeding   . Alopecia   . Anemia   . Angina pectoris with normal coronary arteriogram ([place]) 2017   Had + Troponin c/w ? NSTEMI due to A on C CHF  . 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 Chronic combined systolic and diastolic CHF (congestive heart failure) ([place])   . DYSLIPIDEMIA   . Essential hypertension   . Fibroids   . H/O noncompliance with medical treatment, presenting hazards to health   . Hypertension   . Hypokalemia 07/23/2016  . Menorrhagia   . 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 iniatially ?2/2 peripartum in 1994 - improved by 2008 then worsening EF in 2011 back down to EF 25-30%. b. echo 01/21/14 showed mod LVH, [name]; [name], [name], [name],   . 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 NSTEMI (non-ST elevated myocardial infarction) ([place]) 05/2015   Normal Coronaries.  . Peripartum cardiomyopathy 1994  .  Sleep apnea 2015   CPAP 12/2013  . Stroke [facility]) 2011  . Systolic and diastolic CHF, acute on chronic ([place]) 06/14/2015  . Tobacco abuse     Patient Active Problem List   Diagnosis Date Noted  . Degenerative disc disease at L5-S1 level 10/10/2018  . Abnormal thyroid blood test 03/15/2017  . Hypokalemia 07/23/2016  . Low TSH level 04/26/2016  . Abnormal uterine bleeding 04/06/2016  . Nonspecific chest pain   . 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 NSTEMI (non-ST elevated myocardial infarction) ([place]) - with normal cornaries on Cath 06/14/2015  . Systolic and diastolic CHF, acute on chronic ([place]) 06/14/2015  . H/O noncompliance with medical treatment, presenting hazards to health   . Low back pain 09/16/2014  . Homelessness 09/16/2014  . Vitamin D deficiency 01/29/2014  . OSA (obstructive sleep apnea) 01/08/2014  . Chronic combined systolic and diastolic CHF (congestive heart failure) ([place]) 08/14/2013  . Essential hypertension   . Fibroids 08/09/2012  . Menorrhagia 05/08/2012  . Female pattern hair loss   . HLD (hyperlipidemia) 06/08/2010  . CEREBRAL ANEURYSM 06/08/2010  . 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 CEREBROVASCULAR ACCIDENT, HX OF 06/08/2010    Past Surgical History:  Procedure Laterality Date  . CARDIAC CATHETERIZATION N/A 06/16/2015   Procedure: Left Heart Cath and Coronary Angiography;  Surgeon:  Jettie Booze, MD;  Location: [facility];  Service: Cardiovascular;  Laterality: N/A;  . [place]  . LOOP RECORDER IMPLANT  ~ 2000  . TIBIAL TUBERCLERPLASTY    . TUBAL LIGATION  1994    OB History    Gravida  2   Para  2   Term  2   Preterm      AB      Living  3     SAB      TAB      Ectopic      Multiple  1   Live Births  3            Home Medications    Prior to Admission medications   Medication Sig Start Date End Date Taking? Authorizing Provider  acetaminophen (TYLENOL) 500 MG tablet  Take 1 tablet (500 mg total) by mouth every 6 (six) hours as needed. 10/19/18  Yes Bast, Traci A, NP  DULoxetine (CYMBALTA) 30 MG capsule Take 1 capsule (30 mg total) by mouth daily. 12/07/18 06/10/19 Yes Newlin, Charlane Ferretti, MD  acetaminophen-codeine (TYLENOL #3) 300-30 MG tablet Take 1 tablet by mouth every 12 (twelve) hours as needed for moderate pain. 12/07/18   Charlott Rakes, MD  albuterol (VENTOLIN HFA) 108 (90 Base) MCG/ACT inhaler Inhale 1-2 puffs into the lungs every 6 (six) hours as needed for wheezing or shortness of breath. 06/10/19   Raylene Everts, MD  aspirin 81 MG EC tablet Take 1 tablet (81 mg total) by mouth daily. 03/31/16   Milagros Loll, MD  cyclobenzaprine (FLEXERIL) 10 MG tablet Take 1 tablet (10 mg total) by mouth 2 (two) times daily as needed for muscle spasms. 12/07/18   Charlott Rakes, MD  diclofenac sodium (VOLTAREN) 1 % GEL Apply 4 g topically 4 (four) times daily. 10/19/18   Loura Halt A, NP  famotidine (PEPCID) 20 MG tablet Take 1 tablet (20 mg total) 2 (two) times daily by mouth. 04/10/17   Langston Masker B, PA-C  gabapentin (NEURONTIN) 300 MG capsule Take 1 capsule (300 mg total) by mouth at bedtime. 12/07/18   Charlott Rakes, MD  HYDROcodone-acetaminophen (NORCO) 7.5-325 MG tablet Take 1 tablet by mouth every 6 (six) hours as needed for moderate pain. 06/10/19   Raylene Everts, MD  ibuprofen (ADVIL) 800 MG tablet TAKE 1 TABLET (800 MG TOTAL) BY MOUTH EVERY 12 (TWELVE) HOURS AS NEEDED. 04/23/19   Charlott Rakes, MD  meclizine (ANTIVERT) 25 MG tablet Take 1 tablet (25 mg total) by mouth 2 (two) times daily as needed for dizziness. 12/07/18   Charlott Rakes, MD  metoprolol succinate (TOPROL-XL) 50 MG 24 hr tablet TAKE 1 AND A HALF TABLETS DAILY 05/23/19   Deboraha Sprang, MD  Misc. Devices MISC Please provide patient with insurance approved straight cane 09/29/18   Gildardo Pounds, NP  Misc. Devices MISC Single-point cane with adjustable height x1, shower chair x1,  transfer bench x1.  Diagnosis congestive heart failure, degenerative disc disease of the lumbar spine. 11/23/18   Charlott Rakes, MD  potassium chloride SA (KLOR-CON M20) 20 MEQ tablet Take 1-2 tablets (20-40 mEq total) by mouth as directed. Take 2 tablets in the morning (40 Meq) and 1 tablet (20 meq)  in the evening 03/06/19   Kathlen Mody, Scott T, PA-C  sacubitril-valsartan (ENTRESTO) 49-51 MG Take 1 tablet by mouth 2 (two) times daily. 03/06/19   Richardson Dopp T, PA-C  simvastatin (ZOCOR) 10  MG tablet TAKE 1 TABLET BY MOUTH EVERY DAY IN THE EVENING 07/26/18   Baldwin Jamaica, PA-C  spironolactone (ALDACTONE) 25 MG tablet TAKE 0.5 TABLETS (12.5 MG TOTAL) BY MOUTH DAILY. 07/26/18   Baldwin Jamaica, PA-C  torsemide (DEMADEX) 20 MG tablet Take 1 tablet (20 mg total) by mouth daily. Take 1 tablet by mouth once a day 03/06/19   Liliane Shi, PA-C    Family History Family History  Problem Relation Age of Onset  . Cancer Maternal Grandmother        uterine  . Hypertension Sister   . Healthy Mother   . Other Neg Hx   . Heart disease Neg Hx     Social History Social History   Tobacco Use  . Smoking status: Former Smoker    Packs/day: 0.10    Years: 30.00    Pack years: 3.00    Types: Cigarettes    Quit date: 06/03/2015    Years since quitting: 4.0  . Smokeless tobacco: Never Used  . Tobacco comment: Pt. stated she stopped smoking a year ago. 09/29/2018  Substance Use Topics  . Alcohol use: No    Alcohol/week: 0.0 standard drinks  . Drug use: No     Allergies   Ace inhibitors   Review of Systems Review of Systems  Constitutional: Negative for diaphoresis and fever.  HENT: Negative for congestion and rhinorrhea.   Respiratory: Negative for cough and shortness of breath.   Cardiovascular: Negative for chest pain and palpitations.  Gastrointestinal: Negative for diarrhea, nausea and vomiting.  Musculoskeletal: Positive for back pain, myalgias, neck pain and neck stiffness.        Body aches throughout neck and back.  Denies pain in arms and legs  Neurological: Negative for weakness and headaches.     Physical Exam Triage Vital Signs ED Triage Vitals  Enc Vitals Group     BP 06/10/19 1238 95/72     Pulse Rate 06/10/19 1238 84     Resp 06/10/19 1238 16     Temp 06/10/19 1238 98.1 F (36.7 C)     Temp Source 06/10/19 1238 Oral     SpO2 --      Weight --      Height --      Head Circumference --      Peak Flow --      Pain Score 06/10/19 1235 9     Pain Loc --      Pain Edu? --      Excl. in [place]? --    No data found.  Updated Vital Signs BP 95/72 (BP Location: Right Arm)   Pulse 84   Temp 98.1 F (36.7 C) (Oral)   Resp 16      Physical Exam Constitutional:      General: She is not in acute distress.    Appearance: She is well-developed. She is obese.     Comments: Appears uncomfortable.  Slow movements  HENT:     Head: Normocephalic and atraumatic.     Mouth/Throat:     Comments: Mask in place Eyes:     Conjunctiva/sclera: Conjunctivae normal.     Pupils: Pupils are equal, round, and reactive to light.  Cardiovascular:     Rate and Rhythm: Normal rate and regular rhythm.     Heart sounds: Normal heart sounds.  Pulmonary:     Effort: Pulmonary effort is normal. No respiratory distress.     Breath sounds: No wheezing  or rales.  Abdominal:     General: There is no distension.     Palpations: Abdomen is soft.  Musculoskeletal:        General: Normal range of motion.     Cervical back: Normal range of motion.     Comments: No tenderness of neck or back muscles.  Slow but full range of motion.  Skin:    General: Skin is warm and dry.  Neurological:     Mental Status: She is alert.     Gait: Gait abnormal.     Comments: Antalgic gait, slightly bent, moves slowly  Psychiatric:        Mood and Affect: Mood normal.        Behavior: Behavior normal.      UC Treatments / Results  Labs (all labs ordered are listed, but only abnormal  results are displayed) Labs Reviewed  COMPREHENSIVE METABOLIC PANEL - Abnormal; Notable for the following components:      Result Value   Potassium 2.8 (*)    Chloride 92 (*)    Glucose, Bld 203 (*)    Creatinine, Ser 1.03 (*)    Albumin 3.0 (*)    All other components within normal limits  MAGNESIUM - Abnormal; Notable for the following components:   Magnesium 1.4 (*)    All other components within normal limits  NOVEL CORONAVIRUS, NAA (HOSP ORDER, SEND-OUT TO REF LAB; TAT 18-24 HRS)    EKG   Radiology No results found.  Procedures Procedures (including critical care time)  Medications Ordered in UC Medications - No data to display  Initial Impression / Assessment and Plan / UC Course  I have reviewed the triage vital signs and the nursing notes.  Pertinent labs & imaging results that were available during my care of the patient were reviewed by me and considered in my medical decision making (see chart for details).     Patient states she just wants pain medicine.  She identifies her back pain is the biggest problem.  I explained to her that I wanted to get blood work to make sure she did not have an electrolyte problem given her multiple medications.  Past history of hypokalemia.  Patient agrees to go home and rest, and allow me to call her with her test results  Addendum Results are received.  Potassium 2.8.  I called patient and advised her to go to the emergency room for care.  Patient told me she did not want to go to the emergency room, she did not want to wait.  I explained that this is potentially dangerous.  She states she was going to take her potassium and call her doctor tomorrow.  I did ascertain that her son and boyfriend are there with her.  I did get her to agree that if she feels worse instead of better at any time that she will go to the ER. Final Clinical Impressions(s) / UC Diagnoses   Final diagnoses:  Generalized body aches     Discharge  Instructions     Take the pain medicine as needed Do not drive or work on the pain medicine as it may cause drowsiness This will not be refilled.  If you need more, call your PCP or spine doctors Quarantine until you get the covid result It is available through My [facility]    ED Prescriptions    Medication Sig Dispense Auth. Provider   HYDROcodone-acetaminophen (NORCO) 7.5-325 MG tablet Take 1 tablet by mouth  every 6 (six) hours as needed for moderate pain. 15 tablet Raylene Everts, MD   albuterol (VENTOLIN HFA) 108 (90 Base) MCG/ACT inhaler Inhale 1-2 puffs into the lungs every 6 (six) hours as needed for wheezing or shortness of breath. 18 g Raylene Everts, MD     I have reviewed the PDMP during this encounter.   Raylene Everts, MD 06/10/19 [phone]

## 2019-06-10 NOTE — ED Triage Notes (Signed)
Patient presents to Urgent Care with complaints of generalized body aches since the past 3 weeks. Patient reports she got several injections in her back for the pain last month. Pt took extra strength tylenol at home. Pt playing a game on her phone during triage and assessment.

## 2019-06-11 ENCOUNTER — Telehealth: Payer: Self-pay | Admitting: Physician Assistant

## 2019-06-11 DIAGNOSIS — E876 Hypokalemia: Secondary | ICD-10-CM

## 2019-06-11 MED ORDER — POTASSIUM CHLORIDE CRYS ER 20 MEQ PO TBCR: EXTENDED_RELEASE_TABLET | ORAL | 3 refills | Status: DC

## 2019-06-11 MED ORDER — MAGNESIUM OXIDE 400 MG PO CAPS: 400.0000 mg | ORAL_CAPSULE | Freq: Two times a day (BID) | ORAL | 3 refills | Status: DC

## 2019-06-11 NOTE — Telephone Encounter (Signed)
Per pt call stated she visited [facility] yesterday and was told to call this office because of her Potasium level being at 8.5 to lowe.  Pt stated she would like a call back on what to do she stated does not want to go to the Hospital.  Please give her a call back.

## 2019-06-11 NOTE — Telephone Encounter (Signed)
Lab Results  Component Value Date   K 2.8 (L) 06/10/2019   CREATININE 1.03 (H) 06/10/2019   MG 1.4 (L) 06/10/2019    PLAN:  1. Take K+ 60 mEq extra today (in addition to her usual dose of 40 mEq in the A and 20 mEq in the P). 2. Increase daily dose of K+ to 60 mEq in A and 40 mEq in P (starting tomorrow) 3. Start Magnesium Oxide 400 mg twice daily  4. BMET, Mg2+ in 1 week Richardson Dopp, PA-C    06/11/2019 1:52 PM

## 2019-06-11 NOTE — Telephone Encounter (Signed)
Pt seen at Urgent Care yesterday for body aches and back pain.  Her K+ was noted to be 2.8.  They contacted her and advised she go to ER but she declined.  Pt takes K+ 52meq- 2 tabs in AM, 1 in PM.  She has been taking this regularly.  Denies any feelings of palps or heart racing since leaving UC.  Pt has not taken her meds this morning.  Advised to go ahead and take an extra tab this morning and I will send message to Richardson Dopp, PA-C that she seen last in October for review and advisement.

## 2019-06-11 NOTE — Telephone Encounter (Signed)
Called patient with Chelsea Curet PA instructions. Patient verbalized understanding. Sent in prescriptions to patient's pharmacy. Patient will come in on 06/18/19 for BMET and Mg.

## 2019-06-12 LAB — NOVEL CORONAVIRUS, NAA (HOSP ORDER, SEND-OUT TO REF LAB; TAT 18-24 HRS): SARS-CoV-2, NAA: NOT DETECTED

## 2019-06-18 ENCOUNTER — Other Ambulatory Visit: Payer: Medicaid Other

## 2019-07-14 ENCOUNTER — Ambulatory Visit (HOSPITAL_COMMUNITY)
Admission: EM | Admit: 2019-07-14 | Discharge: 2019-07-14 | Disposition: A | Discharge status: Home / Self Care | Payer: Medicaid Other | Attending: Family Medicine | Admitting: Family Medicine

## 2019-07-14 ENCOUNTER — Encounter (HOSPITAL_COMMUNITY): Payer: Self-pay

## 2019-07-14 ENCOUNTER — Other Ambulatory Visit: Payer: Self-pay

## 2019-07-14 DIAGNOSIS — G8929 Other chronic pain: Secondary | ICD-10-CM

## 2019-07-14 DIAGNOSIS — R110 Nausea: Secondary | ICD-10-CM

## 2019-07-14 DIAGNOSIS — M5442 Lumbago with sciatica, left side: Secondary | ICD-10-CM | POA: Diagnosis not present

## 2019-07-14 MED ORDER — IBUPROFEN 800 MG PO TABS
ORAL_TABLET | ORAL | Status: AC
  Filled 2019-07-14: qty 1

## 2019-07-14 MED ORDER — ONDANSETRON HCL 4 MG PO TABS: 4.0000 mg | ORAL_TABLET | Freq: Four times a day (QID) | ORAL | 0 refills | Status: DC

## 2019-07-14 MED ORDER — DEXAMETHASONE SODIUM PHOSPHATE 10 MG/ML IJ SOLN
INTRAMUSCULAR | Status: AC
  Filled 2019-07-14: qty 1

## 2019-07-14 MED ORDER — IBUPROFEN 800 MG PO TABS: 800.0000 mg | ORAL_TABLET | Freq: Three times a day (TID) | ORAL | 0 refills | Status: DC | PRN

## 2019-07-14 MED ORDER — DEXAMETHASONE SODIUM PHOSPHATE 10 MG/ML IJ SOLN: 10.0000 mg | Freq: Once | INTRAMUSCULAR | Status: DC

## 2019-07-14 MED ORDER — CYCLOBENZAPRINE HCL 10 MG PO TABS: 10.0000 mg | ORAL_TABLET | Freq: Two times a day (BID) | ORAL | 3 refills | Status: DC | PRN

## 2019-07-14 MED ORDER — IBUPROFEN 800 MG PO TABS: 800.0000 mg | ORAL_TABLET | Freq: Once | ORAL | Status: DC

## 2019-07-14 MED ORDER — DEXAMETHASONE SODIUM PHOSPHATE 10 MG/ML IJ SOLN
10.0000 mg | Freq: Once | INTRAMUSCULAR | Status: AC
  Administered 2019-07-14: 10 mg via INTRAMUSCULAR

## 2019-07-14 MED ORDER — ONDANSETRON 4 MG PO TBDP
ORAL_TABLET | ORAL | Status: AC
  Filled 2019-07-14: qty 1

## 2019-07-14 MED ORDER — ONDANSETRON 4 MG PO TBDP
4.0000 mg | ORAL_TABLET | Freq: Once | ORAL | Status: AC
  Administered 2019-07-14: 4 mg via ORAL

## 2019-07-14 NOTE — ED Provider Notes (Signed)
[place]    CSN: [id number] Arrival date & time: 07/14/19  1154      History   Chief Complaint Chief Complaint  Patient presents with  . Back Pain  . Nausea  . Leg Numbness    HPI Chelsea Branch is a 54 y.o. female.   Patient reports that while she was taking a shower last night, when she bent over she began to have pain left lower back and radiating down her left leg.  She reports that there is some tingling and numbness, but the pain overall is worst symptom.  Reports that she is taken 800 milligrams of ibuprofen last night and again at 1130 this morning.  Reports that she has made no other attempts for treatment at home.  Also reports that she has been having nausea, no vomiting.  Denies headache, fever, shortness of breath, cough, chest tightness, rash, other symptoms.  ROS Per HPI  The history is provided by the patient.    Past Medical History:  Diagnosis Date  . Abnormal thyroid blood test 03/15/2017  . Abnormal uterine bleeding   . Alopecia   . Anemia   . Angina pectoris with normal coronary arteriogram ([place]) 2017   Had + Troponin c/w ? NSTEMI due to A on C CHF  . 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 Chronic combined systolic and diastolic CHF (congestive heart failure) ([place])   . DYSLIPIDEMIA   . Essential hypertension   . Fibroids   . H/O noncompliance with medical treatment, presenting hazards to health   . Hypertension   . Hypokalemia 07/23/2016  . Menorrhagia   . 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 iniatially ?2/2 peripartum in 1994 - improved by 2008 then worsening EF in 2011 back down to EF 25-30%. b. echo 01/21/14 showed mod LVH, [name]; [name], [name], [name],   . 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 NSTEMI (non-ST elevated myocardial infarction) ([place]) 05/2015   Normal Coronaries.  . Peripartum cardiomyopathy 1994  . Sleep apnea 2015   CPAP 12/2013  . Stroke [facility]) 2011  .  Systolic and diastolic CHF, acute on chronic ([place]) 06/14/2015  . Tobacco abuse     Patient Active Problem List   Diagnosis Date Noted  . Degenerative disc disease at L5-S1 level 10/10/2018  . Abnormal thyroid blood test 03/15/2017  . Hypokalemia 07/23/2016  . Low TSH level 04/26/2016  . Abnormal uterine bleeding 04/06/2016  . Nonspecific chest pain   . 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 NSTEMI (non-ST elevated myocardial infarction) ([place]) - with normal cornaries on Cath 06/14/2015  . Systolic and diastolic CHF, acute on chronic ([place]) 06/14/2015  . H/O noncompliance with medical treatment, presenting hazards to health   . Low back pain 09/16/2014  . Homelessness 09/16/2014  . Vitamin D deficiency 01/29/2014  . OSA (obstructive sleep apnea) 01/08/2014  . Chronic combined systolic and diastolic CHF (congestive heart failure) ([place]) 08/14/2013  . Essential hypertension   . Fibroids 08/09/2012  . Menorrhagia 05/08/2012  . Female pattern hair loss   . HLD (hyperlipidemia) 06/08/2010  . CEREBRAL ANEURYSM 06/08/2010  . 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 CEREBROVASCULAR ACCIDENT, HX OF 06/08/2010    Past Surgical History:  Procedure Laterality Date  . CARDIAC CATHETERIZATION N/A 06/16/2015   Procedure: Left Heart Cath and Coronary Angiography;  Surgeon: Jettie Booze, MD;  Location: [place] [facility];  Service: Cardiovascular;  Laterality: N/A;  .  [place]  . LOOP RECORDER IMPLANT  ~ 2000  . TIBIAL TUBERCLERPLASTY    . TUBAL LIGATION  1994    OB History    Gravida  2   Para  2   Term  2   Preterm      AB      Living  3     SAB      TAB      Ectopic      Multiple  1   Live Births  3            Home Medications    Prior to Admission medications   Medication Sig Start Date End Date Taking? Authorizing Provider  acetaminophen (TYLENOL) 500 MG tablet Take 1 tablet (500 mg total) by mouth every 6 (six) hours  as needed. 10/19/18   Bast, Tressia Miners A, NP  acetaminophen-codeine (TYLENOL #3) 300-30 MG tablet Take 1 tablet by mouth every 12 (twelve) hours as needed for moderate pain. 12/07/18   Charlott Rakes, MD  albuterol (VENTOLIN HFA) 108 (90 Base) MCG/ACT inhaler Inhale 1-2 puffs into the lungs every 6 (six) hours as needed for wheezing or shortness of breath. 06/10/19   Raylene Everts, MD  aspirin 81 MG EC tablet Take 1 tablet (81 mg total) by mouth daily. 03/31/16   Milagros Loll, MD  cyclobenzaprine (FLEXERIL) 10 MG tablet Take 1 tablet (10 mg total) by mouth 2 (two) times daily as needed for muscle spasms. 07/14/19   Faustino Congress, NP  diclofenac sodium (VOLTAREN) 1 % GEL Apply 4 g topically 4 (four) times daily. 10/19/18   Loura Halt A, NP  DULoxetine (CYMBALTA) 30 MG capsule Take 1 capsule (30 mg total) by mouth daily. 12/07/18 06/10/19  Charlott Rakes, MD  famotidine (PEPCID) 20 MG tablet Take 1 tablet (20 mg total) 2 (two) times daily by mouth. 04/10/17   Langston Masker B, PA-C  gabapentin (NEURONTIN) 300 MG capsule Take 1 capsule (300 mg total) by mouth at bedtime. 12/07/18   Charlott Rakes, MD  HYDROcodone-acetaminophen (NORCO) 7.5-325 MG tablet Take 1 tablet by mouth every 6 (six) hours as needed for moderate pain. 06/10/19   Raylene Everts, MD  ibuprofen (ADVIL) 800 MG tablet Take 1 tablet (800 mg total) by mouth every 8 (eight) hours as needed for moderate pain. 07/14/19   Faustino Congress, NP  Magnesium Oxide 400 MG CAPS Take 1 capsule (400 mg total) by mouth 2 (two) times daily. 06/11/19   Richardson Dopp T, PA-C  meclizine (ANTIVERT) 25 MG tablet Take 1 tablet (25 mg total) by mouth 2 (two) times daily as needed for dizziness. 12/07/18   Charlott Rakes, MD  metoprolol succinate (TOPROL-XL) 50 MG 24 hr tablet TAKE 1 AND A HALF TABLETS DAILY 05/23/19   Deboraha Sprang, MD  Misc. Devices MISC Please provide patient with insurance approved straight cane 09/29/18   Gildardo Pounds, NP   Misc. Devices MISC Single-point cane with adjustable height x1, shower chair x1, transfer bench x1.  Diagnosis congestive heart failure, degenerative disc disease of the lumbar spine. 11/23/18   Charlott Rakes, MD  ondansetron (ZOFRAN) 4 MG tablet Take 1 tablet (4 mg total) by mouth every 6 (six) hours. 07/14/19   Faustino Congress, NP  potassium chloride SA (KLOR-CON M20) 20 MEQ tablet Take 3 tabs (60 meq) by mouth in the morning and 2 tabs (40 meq) by mouth in the evening 06/11/19   Richardson Dopp  T, PA-C  sacubitril-valsartan (ENTRESTO) 49-51 MG Take 1 tablet by mouth 2 (two) times daily. 03/06/19   Richardson Dopp T, PA-C  simvastatin (ZOCOR) 10 MG tablet TAKE 1 TABLET BY MOUTH EVERY DAY IN THE EVENING 07/26/18   Baldwin Jamaica, PA-C  spironolactone (ALDACTONE) 25 MG tablet TAKE 0.5 TABLETS (12.5 MG TOTAL) BY MOUTH DAILY. 07/26/18   Baldwin Jamaica, PA-C  torsemide (DEMADEX) 20 MG tablet Take 1 tablet (20 mg total) by mouth daily. Take 1 tablet by mouth once a day 03/06/19   Liliane Shi, PA-C    Family History Family History  Problem Relation Age of Onset  . Cancer Maternal Grandmother        uterine  . Hypertension Sister   . Healthy Mother   . Other Neg Hx   . Heart disease Neg Hx     Social History Social History   Tobacco Use  . Smoking status: Former Smoker    Packs/day: 0.10    Years: 30.00    Pack years: 3.00    Types: Cigarettes    Quit date: 06/03/2015    Years since quitting: 4.1  . Smokeless tobacco: Never Used  . Tobacco comment: Pt. stated she stopped smoking a year ago. 09/29/2018  Substance Use Topics  . Alcohol use: No    Alcohol/week: 0.0 standard drinks  . Drug use: No     Allergies   Ace inhibitors   Review of Systems Review of Systems   Physical Exam Triage Vital Signs ED Triage Vitals  Enc Vitals Group     BP 07/14/19 1222 113/78     Pulse Rate 07/14/19 1222 75     Resp 07/14/19 1222 17     Temp 07/14/19 1222 98 F (36.7 C)     Temp  Source 07/14/19 1222 Oral     SpO2 07/14/19 1222 100 %     Weight --      Height --      Head Circumference --      Peak Flow --      Pain Score 07/14/19 1223 9     Pain Loc --      Pain Edu? --      Excl. in [address]? --    No data found.  Updated Vital Signs BP 113/78 (BP Location: Left Arm)   Pulse 75   Temp 98 F (36.7 C) (Oral)   Resp 17   SpO2 100%   Visual Acuity Right Eye Distance:   Left Eye Distance:   Bilateral Distance:    Right Eye Near:   Left Eye Near:    Bilateral Near:     Physical Exam Vitals and nursing note reviewed.  Constitutional:      General: She is not in acute distress.    Appearance: She is well-developed.  HENT:     Head: Normocephalic and atraumatic.     Nose: Nose normal.     Mouth/Throat:     Mouth: Mucous membranes are moist.  Eyes:     Conjunctiva/sclera: Conjunctivae normal.  Cardiovascular:     Rate and Rhythm: Normal rate and regular rhythm.     Heart sounds: Normal heart sounds. No murmur.  Pulmonary:     Effort: Pulmonary effort is normal. No respiratory distress.     Breath sounds: Normal breath sounds.  Abdominal:     Palpations: Abdomen is soft.     Tenderness: There is no abdominal tenderness.  Musculoskeletal:  Cervical back: Normal range of motion and neck supple.     Lumbar back: Tenderness present.       Back:     Comments: Area of tenderness and direction of radiation.  Skin:    General: Skin is warm and dry.     Capillary Refill: Capillary refill takes less than 2 seconds.  Neurological:     General: No focal deficit present.     Mental Status: She is alert and oriented to person, place, and time.  Psychiatric:        Mood and Affect: Mood normal.        Behavior: Behavior normal.      UC Treatments / Results  Labs (all labs ordered are listed, but only abnormal results are displayed) Labs Reviewed - No data to display  EKG   Radiology No results found.  Procedures Procedures (including  critical care time)  Medications Ordered in UC Medications  ondansetron (ZOFRAN-ODT) disintegrating tablet 4 mg (4 mg Oral Given 07/14/19 1246)  dexamethasone (DECADRON) injection 10 mg (10 mg Intramuscular Given 07/14/19 1246)    Initial Impression / Assessment and Plan / UC Course  I have reviewed the triage vital signs and the nursing notes.  Pertinent labs & imaging results that were available during my care of the patient were reviewed by me and considered in my medical decision making (see chart for details).     Tenderness to left lower lumbosacral area, with radiation down the left leg.  Reports tingling, almost numbness to left leg.  No loss of sensation on exam.  Patient tearful during exam.  Instructed to continue taking her 800 mg ibuprofen every 8 hours as needed for pain, sent in Flexeril as needed for muscle spasms.  Decadron given IM in office today, and 4 mg of Zofran in office today.  Instructed on when to follow-up with PCP, orthopedics.  Instructed on when to go to the ER. Final Clinical Impressions(s) / UC Diagnoses   Final diagnoses:  Acute left-sided low back pain with left-sided sciatica  Nausea without vomiting     Discharge Instructions     You have an inflamed sciatic nerve.  We have given you a steroid shot, and Zofran for nausea.  I have sent an 800 mg ibuprofen to your pharmacy, along with Zofran, and Flexeril for muscle spasms.  If you are not feeling better by Monday follow-up with orthopedics or your primary care.  Go to the emergency room if you have any worsening symptoms before then.    ED Prescriptions    Medication Sig Dispense Auth. Provider   cyclobenzaprine (FLEXERIL) 10 MG tablet Take 1 tablet (10 mg total) by mouth 2 (two) times daily as needed for muscle spasms. 30 tablet Faustino Congress, NP   ondansetron (ZOFRAN) 4 MG tablet Take 1 tablet (4 mg total) by mouth every 6 (six) hours. 12 tablet Faustino Congress, NP   ibuprofen  (ADVIL) 800 MG tablet Take 1 tablet (800 mg total) by mouth every 8 (eight) hours as needed for moderate pain. 21 tablet Faustino Congress, NP     I have reviewed the PDMP during this encounter.   Faustino Congress, NP 07/14/19 1308

## 2019-07-14 NOTE — ED Triage Notes (Signed)
Pt presents with left side lower back pain & left leg numbness after bending over last night to clean her feet and ankles.  Pt states she has chronic back issues after a bad fall while ago..  Pt also states she has has nausea this morning.

## 2019-07-14 NOTE — Discharge Instructions (Addendum)
You have an inflamed sciatic nerve.  We have given you a steroid shot, and Zofran for nausea.  I have sent an 800 mg ibuprofen to your pharmacy, along with Zofran, and Flexeril for muscle spasms.  If you are not feeling better by Monday follow-up with orthopedics or your primary care.  Go to the emergency room if you have any worsening symptoms before then.

## 2019-07-29 ENCOUNTER — Other Ambulatory Visit: Payer: Self-pay | Admitting: Physician Assistant

## 2019-09-04 NOTE — Progress Notes (Deleted)
PCP:  Charlott Rakes, MD Primary Cardiologist: Virl Axe, MD  Chelsea Branch is a 54 y.o. female with past medical history of *** who presents today for routine electrophysiology followup. They are seen for Dr. Caryl Comes.   Since last being seen in our clinic, the patient reports doing very well.    The patient feels that she is tolerating medications without difficulties and is otherwise without complaint today.   Past Medical History:  Diagnosis Date  . Abnormal thyroid blood test 03/15/2017  . Abnormal uterine bleeding   . Alopecia   . Anemia   . Angina pectoris with normal coronary arteriogram ([place]) 2017   Had + Troponin c/w ? NSTEMI due to A on C CHF  . 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 Chronic combined systolic and diastolic CHF (congestive heart failure) ([place])   . DYSLIPIDEMIA   . Essential hypertension   . Fibroids   . H/O noncompliance with medical treatment, presenting hazards to health   . Hypertension   . Hypokalemia 07/23/2016  . Menorrhagia   . 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 iniatially ?2/2 peripartum in 1994 - improved by 2008 then worsening EF in 2011 back down to EF 25-30%. b. echo 01/21/14 showed mod LVH, [name]; [name], [name], [name],   . 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 NSTEMI (non-ST elevated myocardial infarction) ([place]) 05/2015   Normal Coronaries.  . Peripartum cardiomyopathy 1994  . Sleep apnea 2015   CPAP 12/2013  . Stroke [facility]) 2011  . Systolic and diastolic CHF, acute on chronic ([place]) 06/14/2015  . Tobacco abuse    Past Surgical History:  Procedure Laterality Date  . CARDIAC CATHETERIZATION N/A 06/16/2015   Procedure: Left Heart Cath and Coronary Angiography;  Surgeon: Jettie Booze, MD;  Location: [place] [facility];  Service: Cardiovascular;  Laterality: N/A;  . [place]  . LOOP RECORDER IMPLANT  ~ 2000  . TIBIAL TUBERCLERPLASTY    . TUBAL LIGATION   1994    Current Outpatient Medications  Medication Sig Dispense Refill  . acetaminophen (TYLENOL) 500 MG tablet Take 1 tablet (500 mg total) by mouth every 6 (six) hours as needed. 30 tablet 0  . acetaminophen-codeine (TYLENOL #3) 300-30 MG tablet Take 1 tablet by mouth every 12 (twelve) hours as needed for moderate pain. 30 tablet 0  . albuterol (VENTOLIN HFA) 108 (90 Base) MCG/ACT inhaler Inhale 1-2 puffs into the lungs every 6 (six) hours as needed for wheezing or shortness of breath. 18 g 0  . aspirin 81 MG EC tablet Take 1 tablet (81 mg total) by mouth daily. 30 tablet 0  . cyclobenzaprine (FLEXERIL) 10 MG tablet Take 1 tablet (10 mg total) by mouth 2 (two) times daily as needed for muscle spasms. 30 tablet 3  . diclofenac sodium (VOLTAREN) 1 % GEL Apply 4 g topically 4 (four) times daily. 100 g 1  . DULoxetine (CYMBALTA) 30 MG capsule Take 1 capsule (30 mg total) by mouth daily. 30 capsule 3  . famotidine (PEPCID) 20 MG tablet Take 1 tablet (20 mg total) 2 (two) times daily by mouth. 6 tablet 0  . gabapentin (NEURONTIN) 300 MG capsule Take 1 capsule (300 mg total) by mouth at bedtime. 30 capsule 3  . HYDROcodone-acetaminophen (NORCO) 7.5-325 MG tablet Take 1 tablet by mouth every 6 (six) hours as needed for moderate pain. 15 tablet 0  . ibuprofen (ADVIL)  800 MG tablet Take 1 tablet (800 mg total) by mouth every 8 (eight) hours as needed for moderate pain. 21 tablet 0  . Magnesium Oxide 400 MG CAPS Take 1 capsule (400 mg total) by mouth 2 (two) times daily. 60 capsule 3  . meclizine (ANTIVERT) 25 MG tablet Take 1 tablet (25 mg total) by mouth 2 (two) times daily as needed for dizziness. 60 tablet 1  . metoprolol succinate (TOPROL-XL) 50 MG 24 hr tablet TAKE 1 AND A HALF TABLETS DAILY 90 tablet 3  . Misc. Devices MISC Please provide patient with insurance approved straight cane 1 each 0  . Misc. Devices MISC Single-point cane with adjustable height x1, shower chair x1, transfer bench x1.   Diagnosis congestive heart failure, degenerative disc disease of the lumbar spine. 1 each 0  . ondansetron (ZOFRAN) 4 MG tablet Take 1 tablet (4 mg total) by mouth every 6 (six) hours. 12 tablet 0  . potassium chloride SA (KLOR-CON M20) 20 MEQ tablet Take 3 tabs (60 meq) by mouth in the morning and 2 tabs (40 meq) by mouth in the evening 150 tablet 3  . sacubitril-valsartan (ENTRESTO) 49-51 MG Take 1 tablet by mouth 2 (two) times daily. 60 tablet 11  . simvastatin (ZOCOR) 10 MG tablet TAKE 1 TABLET BY MOUTH EVERY DAY IN THE EVENING 30 tablet 11  . spironolactone (ALDACTONE) 25 MG tablet TAKE 1/2 TABLET BY MOUTH EVERY DAY 30 tablet 11  . torsemide (DEMADEX) 20 MG tablet Take 1 tablet (20 mg total) by mouth daily. Take 1 tablet by mouth once a day 90 tablet 3   No current facility-administered medications for this visit.    Allergies  Allergen Reactions  . Ace Inhibitors Other (See Comments)    REACTION: Cough    Social History   Socioeconomic History  . Marital status: Single    Spouse name: Not on file  . Number of children: 3  . Years of education: 82  . Highest education level: Not on file  Occupational History  . Occupation: unemployed  Tobacco Use  . Smoking status: Former Smoker    Packs/day: 0.10    Years: 30.00    Pack years: 3.00    Types: Cigarettes    Quit date: 06/03/2015    Years since quitting: 4.2  . Smokeless tobacco: Never Used  . Tobacco comment: Pt. stated she stopped smoking a year ago. 09/29/2018  Substance and Sexual Activity  . Alcohol use: No    Alcohol/week: 0.0 standard drinks  . Drug use: No  . Sexual activity: Yes    Birth control/protection: I.U.D.  Other Topics Concern  . Not on file  Social History Narrative   Lives at home with 55 yo twins ([place] and [place])   40 yo son lives outside the home   84 yo granddaughter    Social Determinants of Health   Financial Resource Strain:   . Difficulty of Paying Living Expenses:   Food Insecurity:    . Worried About Charity fundraiser in the Last Year:   . Arboriculturist in the Last Year:   Transportation Needs:   . Film/video editor (Medical):   Marland Kitchen Lack of Transportation (Non-Medical):   Physical Activity:   . Days of Exercise per Week:   . Minutes of Exercise per Session:   Stress:   . Feeling of Stress :   Social Connections:   . Frequency of Communication with Friends and Family:   .  Frequency of Social Gatherings with Friends and Family:   . Attends Religious Services:   . Active Member of Clubs or Organizations:   . Attends Archivist Meetings:   Marland Kitchen Marital Status:   Intimate Partner Violence:   . Fear of Current or Ex-Partner:   . Emotionally Abused:   Marland Kitchen Physically Abused:   . Sexually Abused:      Review of Systems: General: No chills, fever, night sweats or weight changes  Cardiovascular:  No chest pain, dyspnea on exertion, edema, orthopnea, palpitations, paroxysmal nocturnal dyspnea Dermatological: No rash, lesions or masses Respiratory: No cough, dyspnea Urologic: No hematuria, dysuria Abdominal: No nausea, vomiting, diarrhea, bright red blood per rectum, melena, or hematemesis Neurologic: No visual changes, weakness, changes in mental status All other systems reviewed and are otherwise negative except as noted above.  Physical Exam: There were no vitals filed for this visit.  GEN- The patient is well appearing, alert and oriented x 3 today.   HEENT: normocephalic, atraumatic; sclera clear, conjunctiva pink; hearing intact; oropharynx clear; neck supple, no JVP Lymph- no cervical lymphadenopathy Lungs- Clear to ausculation bilaterally, normal work of breathing.  No wheezes, rales, rhonchi Heart- Regular rate and rhythm, no murmurs, rubs or gallops, PMI not laterally displaced GI- soft, non-tender, non-distended, bowel sounds present, no hepatosplenomegaly Extremities- no clubbing, cyanosis, or edema; DP/PT/radial pulses 2+ bilaterally MS-  no significant deformity or atrophy Skin- warm and dry, no rash or lesion Psych- euthymic mood, full affect Neuro- strength and sensation are intact  EKG is ordered. Personal review of EKG from today shows ***  Assessment and Plan:  1. NICM Most recent echo 06/2018 LVEF 60-65% NYHA *** symptoms Volume status *** Continue torsemide 20 mg daily Continue Toprol XL 75 mg daily Continue Entresto 49/51 mg BID Continue spironolactone 12.5 mg daily   2. HTN Continue current medications  3. OSA Encouraged nightly CPAP  Shirley Friar, [place]  09/04/19 12:03 PM

## 2019-09-05 ENCOUNTER — Ambulatory Visit: Payer: Medicaid Other | Admitting: Student

## 2019-09-13 ENCOUNTER — Ambulatory Visit (HOSPITAL_COMMUNITY)
Admission: EM | Admit: 2019-09-13 | Discharge: 2019-09-13 | Disposition: A | Discharge status: Home / Self Care | Payer: Medicaid Other | Attending: Family Medicine | Admitting: Family Medicine

## 2019-09-13 ENCOUNTER — Ambulatory Visit (INDEPENDENT_AMBULATORY_CARE_PROVIDER_SITE_OTHER): Payer: Medicaid Other

## 2019-09-13 ENCOUNTER — Other Ambulatory Visit: Payer: Self-pay

## 2019-09-13 ENCOUNTER — Encounter (HOSPITAL_COMMUNITY): Payer: Self-pay

## 2019-09-13 DIAGNOSIS — Z8673 Personal history of transient ischemic attack (TIA), and cerebral infarction without residual deficits: Secondary | ICD-10-CM | POA: Diagnosis not present

## 2019-09-13 DIAGNOSIS — Z79899 Other long term (current) drug therapy: Secondary | ICD-10-CM | POA: Diagnosis not present

## 2019-09-13 DIAGNOSIS — I110 Hypertensive heart disease with heart failure: Secondary | ICD-10-CM | POA: Insufficient documentation

## 2019-09-13 DIAGNOSIS — Z87891 Personal history of nicotine dependence: Secondary | ICD-10-CM | POA: Diagnosis not present

## 2019-09-13 DIAGNOSIS — G4733 Obstructive sleep apnea (adult) (pediatric): Secondary | ICD-10-CM | POA: Diagnosis not present

## 2019-09-13 DIAGNOSIS — E559 Vitamin D deficiency, unspecified: Secondary | ICD-10-CM | POA: Diagnosis not present

## 2019-09-13 DIAGNOSIS — R05 Cough: Secondary | ICD-10-CM

## 2019-09-13 DIAGNOSIS — E785 Hyperlipidemia, unspecified: Secondary | ICD-10-CM | POA: Insufficient documentation

## 2019-09-13 DIAGNOSIS — Z7982 Long term (current) use of aspirin: Secondary | ICD-10-CM | POA: Diagnosis not present

## 2019-09-13 DIAGNOSIS — Z888 Allergy status to other drugs, medicaments and biological substances status: Secondary | ICD-10-CM | POA: Insufficient documentation

## 2019-09-13 DIAGNOSIS — W57XXXA Bitten or stung by nonvenomous insect and other nonvenomous arthropods, initial encounter: Secondary | ICD-10-CM | POA: Diagnosis not present

## 2019-09-13 DIAGNOSIS — Z8249 Family history of ischemic heart disease and other diseases of the circulatory system: Secondary | ICD-10-CM | POA: Insufficient documentation

## 2019-09-13 DIAGNOSIS — I252 Old myocardial infarction: Secondary | ICD-10-CM | POA: Insufficient documentation

## 2019-09-13 DIAGNOSIS — I5042 Chronic combined systolic (congestive) and diastolic (congestive) heart failure: Secondary | ICD-10-CM | POA: Diagnosis not present

## 2019-09-13 DIAGNOSIS — R059 Cough, unspecified: Secondary | ICD-10-CM

## 2019-09-13 DIAGNOSIS — U071 COVID-19: Secondary | ICD-10-CM | POA: Insufficient documentation

## 2019-09-13 MED ORDER — DIPHENHYDRAMINE HCL 25 MG PO TABS: 25.0000 mg | ORAL_TABLET | Freq: Four times a day (QID) | ORAL | 0 refills | Status: DC

## 2019-09-13 MED ORDER — GUAIFENESIN 100 MG/5ML PO LIQD: 100.0000 mg | ORAL | 0 refills | Status: DC | PRN

## 2019-09-13 MED ORDER — CETIRIZINE HCL 10 MG PO TABS: 10.0000 mg | ORAL_TABLET | Freq: Every day | ORAL | 0 refills | Status: DC

## 2019-09-13 NOTE — ED Provider Notes (Addendum)
[facility]    CSN: [id number] Arrival date & time: 09/13/19  0900      History   Chief Complaint Chief Complaint  Patient presents with  . Cough  . Insect Bite    HPI Chelsea Branch is a 54 y.o. female.   Patient is a 54 year old female with past medical history of anemia, angina, dyslipidemia, essential hypertension, hypokalemia, nonischemic cardiomyopathy, NSTEMI, stroke, CHF.  She presents today with approximate 2 weeks of persistent cough.  The cough is worse at night.  The cough has been somewhat productive.  She has had some mild associated nausea and fatigue.  Denies any associated fever, chills, nasal congestion, rhinorrhea, sore throat or ear pain.  Unsure of sick contacts.  Denies any chest pain, shortness of breath or lower extremity edema.  Patient also complaining of generalized itching to arms, chest and back area.  Reporting she visualized a bedbug and felt the virus this morning.  She is also concerned due to her daughter having fleas.  No specific rash.  Denies any fever, joint pain. Denies any recent changes in lotions, detergents, foods or other possible irritants. No recent travel. Nobody else at home has the rash. Patient has been outside but denies any contact with plants or insects. No new foods or medications.     ROS per HPI      Past Medical History:  Diagnosis Date  . Abnormal thyroid blood test 03/15/2017  . Abnormal uterine bleeding   . Alopecia   . Anemia   . Angina pectoris with normal coronary arteriogram ([place]) 2017   Had + Troponin c/w ? NSTEMI due to A on C CHF  . 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 Chronic combined systolic and diastolic CHF (congestive heart failure) ([place])   . DYSLIPIDEMIA   . Essential hypertension   . Fibroids   . H/O noncompliance with medical treatment, presenting hazards to health   . Hypertension   . Hypokalemia 07/23/2016  . Menorrhagia   . Nonischemic cardiomyopathy ([place]) 1994; 2017   a.  iniatially ?2/2 peripartum in 1994 - improved by 2008 then worsening EF in 2011 back down to EF 25-30%. b. echo 01/21/14 showed mod LVH, [name]; [name], [name], [name],   . 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 NSTEMI (non-ST elevated myocardial infarction) ([place]) 05/2015   Normal Coronaries.  . Peripartum cardiomyopathy 1994  . Sleep apnea 2015   CPAP 12/2013  . Stroke [facility]) 2011  . Systolic and diastolic CHF, acute on chronic ([place]) 06/14/2015  . Tobacco abuse     Patient Active Problem List   Diagnosis Date Noted  . Degenerative disc disease at L5-S1 level 10/10/2018  . Abnormal thyroid blood test 03/15/2017  . Hypokalemia 07/23/2016  . Low TSH level 04/26/2016  . Abnormal uterine bleeding 04/06/2016  . Nonspecific chest pain   . 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 NSTEMI (non-ST elevated myocardial infarction) ([place]) - with normal cornaries on Cath 06/14/2015  . Systolic and diastolic CHF, acute on chronic ([place]) 06/14/2015  . H/O noncompliance with medical treatment, presenting hazards to health   . Low back pain 09/16/2014  . Homelessness 09/16/2014  . Vitamin D deficiency 01/29/2014  . OSA (obstructive sleep apnea) 01/08/2014  . Chronic combined systolic and diastolic CHF (congestive heart failure) ([place]) 08/14/2013  . Essential hypertension   . Fibroids 08/09/2012  . Menorrhagia 05/08/2012  . Female pattern hair loss   . HLD (  hyperlipidemia) 06/08/2010  . CEREBRAL ANEURYSM 06/08/2010  . 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 CEREBROVASCULAR ACCIDENT, HX OF 06/08/2010    Past Surgical History:  Procedure Laterality Date  . CARDIAC CATHETERIZATION N/A 06/16/2015   Procedure: Left Heart Cath and Coronary Angiography;  Surgeon: Jettie Booze, MD;  Location: [facility];  Service: Cardiovascular;  Laterality: N/A;  . [place]  . LOOP RECORDER IMPLANT  ~ 2000  . TIBIAL  TUBERCLERPLASTY    . TUBAL LIGATION  1994    OB History    Gravida  2   Para  2   Term  2   Preterm      AB      Living  3     SAB      TAB      Ectopic      Multiple  1   Live Births  3            Home Medications    Prior to Admission medications   Medication Sig Start Date End Date Taking? Authorizing Provider  acetaminophen (TYLENOL) 500 MG tablet Take 1 tablet (500 mg total) by mouth every 6 (six) hours as needed. 10/19/18   Jessyka Austria, Tressia Miners A, NP  acetaminophen-codeine (TYLENOL #3) 300-30 MG tablet Take 1 tablet by mouth every 12 (twelve) hours as needed for moderate pain. 12/07/18   Charlott Rakes, MD  albuterol (VENTOLIN HFA) 108 (90 Base) MCG/ACT inhaler Inhale 1-2 puffs into the lungs every 6 (six) hours as needed for wheezing or shortness of breath. 06/10/19   Raylene Everts, MD  aspirin 81 MG EC tablet Take 1 tablet (81 mg total) by mouth daily. 03/31/16   Milagros Loll, MD  cetirizine (ZYRTEC) 10 MG tablet Take 1 tablet (10 mg total) by mouth daily. 09/13/19   Loura Halt A, NP  cyclobenzaprine (FLEXERIL) 10 MG tablet Take 1 tablet (10 mg total) by mouth 2 (two) times daily as needed for muscle spasms. 07/14/19   Faustino Congress, NP  diclofenac sodium (VOLTAREN) 1 % GEL Apply 4 g topically 4 (four) times daily. 10/19/18   Loura Halt A, NP  diphenhydrAMINE (BENADRYL) 25 MG tablet Take 1 tablet (25 mg total) by mouth every 6 (six) hours. 09/13/19   Loura Halt A, NP  DULoxetine (CYMBALTA) 30 MG capsule Take 1 capsule (30 mg total) by mouth daily. 12/07/18 06/10/19  Charlott Rakes, MD  famotidine (PEPCID) 20 MG tablet Take 1 tablet (20 mg total) 2 (two) times daily by mouth. 04/10/17   Langston Masker B, PA-C  gabapentin (NEURONTIN) 300 MG capsule Take 1 capsule (300 mg total) by mouth at bedtime. 12/07/18   Charlott Rakes, MD  guaiFENesin (ROBITUSSIN) 100 MG/5ML liquid Take 5-10 mLs (100-200 mg total) by mouth every 4 (four) hours as needed for cough.  09/13/19   Loura Halt A, NP  HYDROcodone-acetaminophen (NORCO) 7.5-325 MG tablet Take 1 tablet by mouth every 6 (six) hours as needed for moderate pain. 06/10/19   Raylene Everts, MD  ibuprofen (ADVIL) 800 MG tablet Take 1 tablet (800 mg total) by mouth every 8 (eight) hours as needed for moderate pain. 07/14/19   Faustino Congress, NP  Magnesium Oxide 400 MG CAPS Take 1 capsule (400 mg total) by mouth 2 (two) times daily. 06/11/19   Richardson Dopp T, PA-C  meclizine (ANTIVERT) 25 MG tablet Take 1 tablet (25 mg total) by mouth 2 (two) times daily as needed for dizziness.  12/07/18   Charlott Rakes, MD  metoprolol succinate (TOPROL-XL) 50 MG 24 hr tablet TAKE 1 AND A HALF TABLETS DAILY 05/23/19   Deboraha Sprang, MD  Misc. Devices MISC Please provide patient with insurance approved straight cane 09/29/18   Gildardo Pounds, NP  Misc. Devices MISC Single-point cane with adjustable height x1, shower chair x1, transfer bench x1.  Diagnosis congestive heart failure, degenerative disc disease of the lumbar spine. 11/23/18   Charlott Rakes, MD  ondansetron (ZOFRAN) 4 MG tablet Take 1 tablet (4 mg total) by mouth every 6 (six) hours. 07/14/19   Faustino Congress, NP  potassium chloride SA (KLOR-CON M20) 20 MEQ tablet Take 3 tabs (60 meq) by mouth in the morning and 2 tabs (40 meq) by mouth in the evening 06/11/19   Richardson Dopp T, PA-C  sacubitril-valsartan (ENTRESTO) 49-51 MG Take 1 tablet by mouth 2 (two) times daily. 03/06/19   Richardson Dopp T, PA-C  simvastatin (ZOCOR) 10 MG tablet TAKE 1 TABLET BY MOUTH EVERY DAY IN THE EVENING 07/26/18   Baldwin Jamaica, PA-C  spironolactone (ALDACTONE) 25 MG tablet TAKE 1/2 TABLET BY MOUTH EVERY DAY 07/30/19   Baldwin Jamaica, PA-C  torsemide (DEMADEX) 20 MG tablet Take 1 tablet (20 mg total) by mouth daily. Take 1 tablet by mouth once a day 03/06/19   Liliane Shi, PA-C    Family History Family History  Problem Relation Age of Onset  . Cancer Maternal  Grandmother        uterine  . Hypertension Sister   . Healthy Mother   . Other Neg Hx   . Heart disease Neg Hx     Social History Social History   Tobacco Use  . Smoking status: Former Smoker    Packs/day: 0.10    Years: 30.00    Pack years: 3.00    Types: Cigarettes    Quit date: 06/03/2015    Years since quitting: 4.2  . Smokeless tobacco: Never Used  . Tobacco comment: Pt. stated she stopped smoking a year ago. 09/29/2018  Substance Use Topics  . Alcohol use: No    Alcohol/week: 0.0 standard drinks  . Drug use: No     Allergies   Ace inhibitors   Review of Systems Review of Systems   Physical Exam Triage Vital Signs ED Triage Vitals  Enc Vitals Group     BP 09/13/19 0915 107/78     Pulse Rate 09/13/19 0915 82     Resp 09/13/19 0915 18     Temp 09/13/19 0915 100 F (37.8 C)     Temp Source 09/13/19 0915 Oral     SpO2 09/13/19 0915 100 %     Weight 09/13/19 0912 210 lb (95.3 kg)     Height --      Head Circumference --      Peak Flow --      Pain Score 09/13/19 0912 6     Pain Loc --      Pain Edu? --      Excl. in [place]? --    No data found.  Updated Vital Signs BP 107/78 (BP Location: Right Arm)   Pulse 82   Temp 100 F (37.8 C) (Oral)   Resp 18   Wt 210 lb (95.3 kg)   SpO2 100%   BMI 31.93 kg/m   Visual Acuity Right Eye Distance:   Left Eye Distance:   Bilateral Distance:    Right Eye Near:  Left Eye Near:    Bilateral Near:     Physical Exam Vitals and nursing note reviewed.  Constitutional:      General: She is not in acute distress.    Appearance: Normal appearance. She is not ill-appearing, toxic-appearing or diaphoretic.  HENT:     Head: Normocephalic.     Nose: Nose normal.  Eyes:     Conjunctiva/sclera: Conjunctivae normal.  Cardiovascular:     Rate and Rhythm: Normal rate and regular rhythm.  Pulmonary:     Effort: Pulmonary effort is normal. No respiratory distress.     Breath sounds: No stridor. No wheezing,  rhonchi or rales.     Comments: Decreased lung sounds on left upper and lower fields Chest:     Chest wall: No tenderness.  Musculoskeletal:        General: Normal range of motion.     Cervical back: Normal range of motion.  Skin:    General: Skin is warm and dry.     Findings: No rash.     Comments: Scattered sores to arms, back, with excoriations.  No rash  Neurological:     Mental Status: She is alert.  Psychiatric:        Mood and Affect: Mood normal.      UC Treatments / Results  Labs (all labs ordered are listed, but only abnormal results are displayed) Labs Reviewed  SARS CORONAVIRUS 2 (TAT 6-24 HRS)    EKG   Radiology DG Chest 2 View  Result Date: 09/13/2019 CLINICAL DATA:  Cough for the past 2 weeks. EXAM: CHEST - 2 VIEW COMPARISON:  Chest x-ray dated February 07, 2019. FINDINGS: The heart size and mediastinal contours are within normal limits. Mildly coarsened interstitial markings are similar to prior study. No focal consolidation, pleural effusion, or pneumothorax. No acute osseous abnormality. IMPRESSION: No active cardiopulmonary disease. Electronically Signed   By: Titus Dubin M.D.   On: 09/13/2019 10:09    Procedures Procedures (including critical care time)  Medications Ordered in UC Medications - No data to display  Initial Impression / Assessment and Plan / UC Course  I have reviewed the triage vital signs and the nursing notes.  Pertinent labs & imaging results that were available during my care of the patient were reviewed by me and considered in my medical decision making (see chart for details).     Cough X-ray negative  Most likely allergy related Treating with Zyrtec daily. Guaifenesin for cough as needed Benadryl as needed for itching related to bug bites. Final Clinical Impressions(s) / UC Diagnoses   Final diagnoses:  Cough  Insect bite, unspecified site, initial encounter     Discharge Instructions     Your x-ray was  normal.  I believe this is most likely allergy related.  We will start taking Zyrtec daily. Cough medication as needed He can use the albuterol inhaler as needed for cough, wheezing or shortness of breath Benadryl as needed for itching from insect bites    ED Prescriptions    Medication Sig Dispense Auth. Provider   guaiFENesin (ROBITUSSIN) 100 MG/5ML liquid Take 5-10 mLs (100-200 mg total) by mouth every 4 (four) hours as needed for cough. 60 mL Genita Nilsson A, NP   diphenhydrAMINE (BENADRYL) 25 MG tablet Take 1 tablet (25 mg total) by mouth every 6 (six) hours. 20 tablet Saamiya Jeppsen A, NP   cetirizine (ZYRTEC) 10 MG tablet Take 1 tablet (10 mg total) by mouth daily. 30 tablet [place],  Satchel Heidinger A, NP     PDMP not reviewed this encounter.   Orvan July, NP 09/13/19 1026    Loura Halt A, NP 09/13/19 1027

## 2019-09-13 NOTE — ED Triage Notes (Signed)
Pt states she has a cough and nausea  x 2 week. Pt she insect bite all over her body.

## 2019-09-13 NOTE — Discharge Instructions (Addendum)
Your x-ray was normal.  I believe this is most likely allergy related.  We will start taking Zyrtec daily. Cough medication as needed He can use the albuterol inhaler as needed for cough, wheezing or shortness of breath Benadryl as needed for itching from insect bites

## 2019-09-14 LAB — SARS CORONAVIRUS 2 (TAT 6-24 HRS): SARS Coronavirus 2: POSITIVE — AB

## 2019-09-24 ENCOUNTER — Ambulatory Visit (HOSPITAL_COMMUNITY)
Admission: EM | Admit: 2019-09-24 | Discharge: 2019-09-24 | Discharge status: Absent without leave | Payer: Medicaid Other

## 2019-09-24 ENCOUNTER — Telehealth (HOSPITAL_COMMUNITY): Payer: Self-pay | Admitting: Urgent Care

## 2019-09-24 NOTE — Telephone Encounter (Signed)
Patient completed her quarantine as of 09/22/2019. She needs documentation of this.

## 2019-09-28 ENCOUNTER — Other Ambulatory Visit: Payer: Self-pay | Admitting: Physician Assistant

## 2019-09-28 DIAGNOSIS — I5022 Chronic systolic (congestive) heart failure: Secondary | ICD-10-CM

## 2019-10-04 ENCOUNTER — Telehealth: Payer: Self-pay | Admitting: Physician Assistant

## 2019-10-04 MED ORDER — ENTRESTO 49-51 MG PO TABS: 1.0000 | ORAL_TABLET | Freq: Two times a day (BID) | ORAL | 1 refills | Status: DC

## 2019-10-04 NOTE — Telephone Encounter (Signed)
Pt's medication was sent to pt's pharmacy as requested. Confirmation received.  °

## 2019-10-04 NOTE — Telephone Encounter (Signed)
New message    *STAT* If patient is at the pharmacy, call can be transferred to refill team.   1. Which medications need to be refilled? (please list name of each medication and dose if known)sacubitril-valsartan (ENTRESTO) 49-51 MG  2. Which pharmacy/location (including street and city if local pharmacy) is medication to be sent to?[id number] - [place], [place] - [address] AT [place]  3. Do they need a 30 day or 90 day supply? [place]

## 2019-10-05 ENCOUNTER — Telehealth: Payer: Self-pay | Admitting: Internal Medicine

## 2019-10-05 NOTE — Telephone Encounter (Signed)
Pt c/o medication issue:  1. Name of Medication: sacubitril-valsartan (ENTRESTO) 49-51 MG  2. How are you currently taking this medication (dosage and times per day)? 1 tablet by mouth two times daily   3. Are you having a reaction (difficulty breathing--STAT)? No   4. What is your medication issue? Chelsea Branch is calling stating the pharmacy informed her that her insurance is not covering her Entresto refill and advised her to have Dr. Caryl Comes get in contact with them in regards to it. Please advise.

## 2019-10-05 NOTE — Telephone Encounter (Signed)
Spoke with pt who states pharmacy told her Chelsea Branch was not covered and pt is unsure why as it has been covered previously.  Spoke with Chelsea Branch at [facility] who states waiting on prior authorization to refill.  Pt advised once prior authorization approved medication will be refilled.  Pt verbalizes understanding and agrees with current plan.

## 2019-10-08 ENCOUNTER — Telehealth: Payer: Self-pay

## 2019-10-08 NOTE — Telephone Encounter (Signed)
**Note De-Identified [place] [employer] Obfuscation** I completed a [employer] Entrest[employer] PA form, printed Echo reports from 06/15/2015 and one from 07/07/2018, scanned and emailed to Dr Aquilla Hacker nurse so she can obtain his signature, date it and fax all to [employer] at fax number written on cover letter included.

## 2019-10-09 NOTE — Telephone Encounter (Signed)
**Note De-Identified Izek Corvino Obfuscation** Per Rosann Auerbach, Dr Olin Pia nurse, the pts [place] [employer] PA form has been successfully faxed to [employer].

## 2019-10-11 ENCOUNTER — Encounter: Payer: Self-pay | Admitting: Student

## 2019-10-11 ENCOUNTER — Other Ambulatory Visit: Payer: Self-pay

## 2019-10-11 ENCOUNTER — Ambulatory Visit (INDEPENDENT_AMBULATORY_CARE_PROVIDER_SITE_OTHER): Payer: Medicaid Other | Admitting: Student

## 2019-10-11 VITALS — BP 106/68 | HR 85 | Ht 68.0 in | Wt 219.8 lb

## 2019-10-11 DIAGNOSIS — R002 Palpitations: Secondary | ICD-10-CM | POA: Diagnosis not present

## 2019-10-11 DIAGNOSIS — I10 Essential (primary) hypertension: Secondary | ICD-10-CM | POA: Diagnosis not present

## 2019-10-11 DIAGNOSIS — I5042 Chronic combined systolic (congestive) and diastolic (congestive) heart failure: Secondary | ICD-10-CM | POA: Diagnosis not present

## 2019-10-11 LAB — BASIC METABOLIC PANEL
BUN/Creatinine Ratio: 12 (ref 9–23)
BUN: 12 mg/dL (ref 6–24)
CO2: 24 mmol/L (ref 20–29)
Calcium: 9.1 mg/dL (ref 8.7–10.2)
Chloride: 95 mmol/L — ABNORMAL LOW (ref 96–106)
Creatinine, Ser: 1.03 mg/dL — ABNORMAL HIGH (ref 0.57–1.00)
GFR calc Af Amer: 71 mL/min/{1.73_m2} (ref >59)
GFR calc non Af Amer: 62 mL/min/{1.73_m2} (ref >59)
Glucose: 270 mg/dL — ABNORMAL HIGH (ref 65–99)
Potassium: 3.7 mmol/L (ref 3.5–5.2)
Sodium: 137 mmol/L (ref 134–144)

## 2019-10-11 LAB — CBC
Hematocrit: 44.3 % (ref 34.0–46.6)
Hemoglobin: 15.2 g/dL (ref 11.1–15.9)
MCH: 29.9 pg (ref 26.6–33.0)
MCHC: 34.3 g/dL (ref 31.5–35.7)
MCV: 87 fL (ref 79–97)
Platelets: 238 10*3/uL (ref 150–450)
RBC: 5.09 x10E6/uL (ref 3.77–5.28)
RDW: 11.9 % (ref 11.7–15.4)
WBC: 7.6 10*3/uL (ref 3.4–10.8)

## 2019-10-11 NOTE — Patient Instructions (Addendum)
Medication Instructions:  NONE *If you need a refill on your cardiac medications before your next appointment, please call your pharmacy*   Lab Work:  TODAY BMET CBC If you have labs (blood work) drawn today and your tests are completely normal, you will receive your results only by: Marland Kitchen MyChart Message (if you have MyChart) OR . A paper copy in the mail If you have any lab test that is abnormal or we need to change your treatment, we will call you to review the results.   Testing/Procedures: NONE   Follow-Up: At [facility], you and your health needs are our priority.  As part of our continuing mission to provide you with exceptional heart care, we have created designated Provider Care Teams.  These Care Teams include your primary Cardiologist (physician) and Advanced Practice Providers (APPs -  Physician Assistants and Nurse Practitioners) who all work together to provide you with the care you need, when you need it.  We recommend signing up for the patient portal called "MyChart".  Sign up information is provided on this After Visit Summary.  MyChart is used to connect with patients for Virtual Visits (Telemedicine).  Patients are able to view lab/test results, encounter notes, upcoming appointments, etc.  Non-urgent messages can be sent to your provider as well.   To learn more about what you can do with MyChart, go to [email or web address].    Your next appointment:   6 MONTHS  The format for your next appointment:   Either In Person or Virtual  Provider:   Dr Caryl Comes   Other Instructions

## 2019-10-11 NOTE — Progress Notes (Signed)
PCP:  Charlott Rakes, MD Primary Cardiologist: Virl Axe, MD  Chelsea Branch is a 54 y.o. female seen today for Dr. Caryl Comes for routine electrophysiology followup.  Since last being seen in our clinic the patient reports continued fatigue. She was diagnosed with COVID last month, and has lingering fatigue. Her taste and smell and begun to come back. She is due Entresto refill x 2 weeks, she THINKS she still has some left, and per telephone notes form faxed 5/18 for prior authorization. Mild dyspnea with moderate exertion at baseline. Denies chest pain, palpitations, PND, orthopnea, nausea, vomiting, dizziness, syncope, edema, weight gain, or early satiety.  Past Medical History:  Diagnosis Date  . Abnormal thyroid blood test 03/15/2017  . Abnormal uterine bleeding   . Alopecia   . Anemia   . Angina pectoris with normal coronary arteriogram ([place]) 2017   Had + Troponin c/w ? NSTEMI due to A on C CHF  . 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 Chronic combined systolic and diastolic CHF (congestive heart failure) ([place])   . DYSLIPIDEMIA   . Essential hypertension   . Fibroids   . H/O noncompliance with medical treatment, presenting hazards to health   . Hypertension   . Hypokalemia 07/23/2016  . Menorrhagia   . 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 iniatially ?2/2 peripartum in 1994 - improved by 2008 then worsening EF in 2011 back down to EF 25-30%. b. echo 01/21/14 showed mod LVH, [name]; [name], [name], [name],   . 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 NSTEMI (non-ST elevated myocardial infarction) ([place]) 05/2015   Normal Coronaries.  . Peripartum cardiomyopathy 1994  . Sleep apnea 2015   CPAP 12/2013  . Stroke [facility]) 2011  . Systolic and diastolic CHF, acute on chronic ([place]) 06/14/2015  . Tobacco abuse    Past Surgical History:  Procedure Laterality Date  . CARDIAC CATHETERIZATION N/A 06/16/2015   Procedure: Left Heart Cath and  Coronary Angiography;  Surgeon: Jettie Booze, MD;  Location: [facility];  Service: Cardiovascular;  Laterality: N/A;  . [place]  . LOOP RECORDER IMPLANT  ~ 2000  . TIBIAL TUBERCLERPLASTY    . TUBAL LIGATION  1994    Current Outpatient Medications  Medication Sig Dispense Refill  . albuterol (VENTOLIN HFA) 108 (90 Base) MCG/ACT inhaler Inhale 1-2 puffs into the lungs every 6 (six) hours as needed for wheezing or shortness of breath. 18 g 0  . aspirin 81 MG EC tablet Take 1 tablet (81 mg total) by mouth daily. 30 tablet 0  . cetirizine (ZYRTEC) 10 MG tablet Take 1 tablet (10 mg total) by mouth daily. 30 tablet 0  . cyclobenzaprine (FLEXERIL) 10 MG tablet Take 1 tablet (10 mg total) by mouth 2 (two) times daily as needed for muscle spasms. 30 tablet 3  . diclofenac sodium (VOLTAREN) 1 % GEL Apply 4 g topically 4 (four) times daily. 100 g 1  . diphenhydrAMINE (BENADRYL) 25 MG tablet Take 1 tablet (25 mg total) by mouth every 6 (six) hours. 20 tablet 0  . DULoxetine (CYMBALTA) 30 MG capsule Take 1 capsule (30 mg total) by mouth daily. 30 capsule 3  . famotidine (PEPCID) 20 MG tablet Take 1 tablet (20 mg total) 2 (two) times daily by mouth. 6 tablet 0  . HYDROcodone-acetaminophen (NORCO) 7.5-325 MG tablet Take 1 tablet by mouth every 6 (six) hours as needed for moderate  pain. 15 tablet 0  . ibuprofen (ADVIL) 800 MG tablet Take 1 tablet (800 mg total) by mouth every 8 (eight) hours as needed for moderate pain. 21 tablet 0  . Magnesium Oxide 400 MG CAPS Take 1 capsule (400 mg total) by mouth 2 (two) times daily. 60 capsule 3  . meclizine (ANTIVERT) 25 MG tablet Take 1 tablet (25 mg total) by mouth 2 (two) times daily as needed for dizziness. 60 tablet 1  . metoprolol succinate (TOPROL-XL) 50 MG 24 hr tablet TAKE 1 AND A HALF TABLETS DAILY 90 tablet 3  . Misc. Devices MISC Please provide patient with insurance approved straight cane 1 each 0  . Misc. Devices MISC  Single-point cane with adjustable height x1, shower chair x1, transfer bench x1.  Diagnosis congestive heart failure, degenerative disc disease of the lumbar spine. 1 each 0  . ondansetron (ZOFRAN) 4 MG tablet Take 1 tablet (4 mg total) by mouth every 6 (six) hours. 12 tablet 0  . potassium chloride SA (KLOR-CON M20) 20 MEQ tablet Take 3 tabs (60 meq) by mouth in the morning and 2 tabs (40 meq) by mouth in the evening 150 tablet 3  . sacubitril-valsartan (ENTRESTO) 49-51 MG Take 1 tablet by mouth 2 (two) times daily. 180 tablet 1  . simvastatin (ZOCOR) 10 MG tablet TAKE 1 TABLET BY MOUTH EVERY DAY IN THE EVENING 30 tablet 4  . spironolactone (ALDACTONE) 25 MG tablet TAKE 1/2 TABLET BY MOUTH EVERY DAY 30 tablet 11  . torsemide (DEMADEX) 20 MG tablet Take 1 tablet (20 mg total) by mouth daily. Take 1 tablet by mouth once a day 90 tablet 3   No current facility-administered medications for this visit.    Allergies  Allergen Reactions  . Ace Inhibitors Other (See Comments)    REACTION: Cough    Social History   Socioeconomic History  . Marital status: Single    Spouse name: Not on file  . Number of children: 3  . Years of education: 67  . Highest education level: Not on file  Occupational History  . Occupation: unemployed  Tobacco Use  . Smoking status: Former Smoker    Packs/day: 0.10    Years: 30.00    Pack years: 3.00    Types: Cigarettes    Quit date: 06/03/2015    Years since quitting: 4.3  . Smokeless tobacco: Never Used  . Tobacco comment: Pt. stated she stopped smoking a year ago. 09/29/2018  Substance and Sexual Activity  . Alcohol use: No    Alcohol/week: 0.0 standard drinks  . Drug use: No  . Sexual activity: Yes    Birth control/protection: I.U.D.  Other Topics Concern  . Not on file  Social History Narrative   Lives at home with 55 yo twins ([place] and [place])   27 yo son lives outside the home   6 yo granddaughter    Social Determinants of Health    Financial Resource Strain:   . Difficulty of Paying Living Expenses:   Food Insecurity:   . Worried About Charity fundraiser in the Last Year:   . Arboriculturist in the Last Year:   Transportation Needs:   . Film/video editor (Medical):   Marland Kitchen Lack of Transportation (Non-Medical):   Physical Activity:   . Days of Exercise per Week:   . Minutes of Exercise per Session:   Stress:   . Feeling of Stress :   Social Connections:   .  Frequency of Communication with Friends and Family:   . Frequency of Social Gatherings with Friends and Family:   . Attends Religious Services:   . Active Member of Clubs or Organizations:   . Attends Archivist Meetings:   Marland Kitchen Marital Status:   Intimate Partner Violence:   . Fear of Current or Ex-Partner:   . Emotionally Abused:   Marland Kitchen Physically Abused:   . Sexually Abused:      Review of Systems: General: No chills, fever, night sweats or weight changes  Cardiovascular:  No chest pain, dyspnea on exertion, edema, orthopnea, palpitations, paroxysmal nocturnal dyspnea Dermatological: No rash, lesions or masses Respiratory: No cough, dyspnea Urologic: No hematuria, dysuria Abdominal: No nausea, vomiting, diarrhea, bright red blood per rectum, melena, or hematemesis Neurologic: No visual changes, weakness, changes in mental status All other systems reviewed and are otherwise negative except as noted above.  Physical Exam: Vitals:   10/11/19 0949  BP: 106/68  Pulse: 85  SpO2: 96%  Weight: 219 lb 12.8 oz (99.7 kg)  Height: 5\' 8"  (1.727 m)    GEN- The patient is well appearing, alert and oriented x 3 today.   HEENT: normocephalic, atraumatic; sclera clear, conjunctiva pink; hearing intact; oropharynx clear; neck supple, no JVP Lymph- no cervical lymphadenopathy Lungs- Clear to ausculation bilaterally, normal work of breathing.  No wheezes, rales, rhonchi Heart- Regular rate and rhythm, no murmurs, rubs or gallops, PMI not laterally  displaced GI- soft, non-tender, non-distended, bowel sounds present, no hepatosplenomegaly Extremities- no clubbing, cyanosis, or edema; DP/PT/radial pulses 2+ bilaterally MS- no significant deformity or atrophy Skin- warm and dry, no rash or lesion Psych- euthymic mood, full affect Neuro- strength and sensation are intact  EKG is ordered. Personal reviewed shows NSR at 85 bpm with normal intervals.  Additional studies reviewed include: Previous EP office notes, Previous Echo.  Assessment and Plan:  1. Chronic systolic CHF/NICM Echo [id number] with normalization of EF.  NYHA II symptoms Continue Toprol 75 mg daily Continue torsemide 20 mg daily and potassium 60 meq q am and 40 meq q pm. Continue spironolactone 12.5 mg daily Continue Entresto 49/51 mg BID. She states she has a few pills left, but also that she has been out for 2 weeks. She will check when she gets home. Form faxed 10/09/2019 for PA for refills. I have asked her to call before she restarts if she has been out with borderline low BP today.  Labs today.   2. HTN Stable on current regimen, ? Off entresto or if she still has several left; she is to clarify when she gets home.   3. Palpitations Stable on BB.  Labs today. RTC 6 months. Sooner with symptoms.   Shirley Friar, PA-C  10/11/19 9:58 AM

## 2019-10-16 ENCOUNTER — Telehealth: Payer: Self-pay | Admitting: Internal Medicine

## 2019-10-16 NOTE — Telephone Encounter (Signed)
New Message    Pt c/o medication issue:  1. Name of Medication: sacubitril-valsartan (ENTRESTO) 49-51 MG  2. How are you currently taking this medication (dosage and times per day)? 1 tablet 2 x daily   3. Are you having a reaction (difficulty breathing--STAT)? No   4. What is your medication issue? Pt is calling and says the pharmacy told her she is need prior authorization for this medication    Please call

## 2019-10-16 NOTE — Telephone Encounter (Signed)
I called [facility] [employer] and was advised by Chong Sicilian that this Entresto PA was approved on 10/09/2019 and is approved until 10/03/2020. [phone] I called the pt and made her aware and I also contacted [facility] and was advised that they are getting RX ready for pick up now.

## 2019-11-03 ENCOUNTER — Ambulatory Visit (HOSPITAL_COMMUNITY)
Admission: EM | Admit: 2019-11-03 | Discharge: 2019-11-03 | Disposition: A | Discharge status: Home / Self Care | Payer: Medicaid Other | Attending: Emergency Medicine | Admitting: Emergency Medicine

## 2019-11-03 ENCOUNTER — Other Ambulatory Visit: Payer: Self-pay

## 2019-11-03 DIAGNOSIS — N898 Other specified noninflammatory disorders of vagina: Secondary | ICD-10-CM

## 2019-11-03 DIAGNOSIS — R21 Rash and other nonspecific skin eruption: Secondary | ICD-10-CM

## 2019-11-03 LAB — POCT URINALYSIS DIP (DEVICE)
Bilirubin Urine: NEGATIVE
Glucose, UA: NEGATIVE mg/dL
Hgb urine dipstick: NEGATIVE
Ketones, ur: NEGATIVE mg/dL
Leukocytes,Ua: NEGATIVE
Nitrite: NEGATIVE
Protein, ur: NEGATIVE mg/dL
Specific Gravity, Urine: >=1.03 (ref 1.005–1.030)
Urobilinogen, UA: 0.2 mg/dL (ref 0.0–1.0)
pH: 5.5 (ref 5.0–8.0)

## 2019-11-03 LAB — HIV ANTIBODY (ROUTINE TESTING W REFLEX): HIV Screen 4th Generation wRfx: NONREACTIVE

## 2019-11-03 MED ORDER — MUPIROCIN 2 % EX OINT: 1.0000 "application " | TOPICAL_OINTMENT | Freq: Two times a day (BID) | CUTANEOUS | 0 refills | Status: DC

## 2019-11-03 MED ORDER — TRIAMCINOLONE ACETONIDE 0.1 % EX CREA: 1.0000 "application " | TOPICAL_CREAM | Freq: Two times a day (BID) | CUTANEOUS | 0 refills | Status: DC

## 2019-11-03 MED ORDER — CEFTRIAXONE SODIUM 500 MG IJ SOLR
500.0000 mg | Freq: Once | INTRAMUSCULAR | Status: AC
  Administered 2019-11-03: 500 mg via INTRAMUSCULAR

## 2019-11-03 MED ORDER — IBUPROFEN 800 MG PO TABS
800.0000 mg | ORAL_TABLET | Freq: Three times a day (TID) | ORAL | 0 refills | Status: DC
Start: 2019-11-03 — End: 2019-12-10

## 2019-11-03 MED ORDER — DOXYCYCLINE HYCLATE 100 MG PO CAPS: 100.0000 mg | ORAL_CAPSULE | Freq: Two times a day (BID) | ORAL | 0 refills | Status: AC

## 2019-11-03 MED ORDER — CEFTRIAXONE SODIUM 500 MG IJ SOLR
INTRAMUSCULAR | Status: AC
  Filled 2019-11-03: qty 500

## 2019-11-03 MED ORDER — LIDOCAINE HCL (PF) 1 % IJ SOLN
INTRAMUSCULAR | Status: AC
  Filled 2019-11-03: qty 2

## 2019-11-03 NOTE — ED Triage Notes (Signed)
Pt presents today with vaginal discharge and abdominal discomfort. Pt states discharge also has an odor. Endorses urinary frequency. Of note pt also has a rash on her legs she would like checked out.

## 2019-11-03 NOTE — Discharge Instructions (Signed)
We have treated you today for gonorrhea, with rocephin. Begin doxycycline twice daily for 1 week to treat chlamydia Please refrain from sexual intercourse for 7 days while medicines eliminating infection.   We are testing you for Gonorrhea, Chlamydia, Trichomonas, Yeast and Bacterial Vaginosis. We will call you if anything is positive and let you know if you require any further treatment. Please inform partners of any positive results.   May use triamcinolone twice daily to areas on leg, may use Bactroban twice daily on areas that are open to help prevent infection  Please return if symptoms not improving with treatment, development of fever, nausea, vomiting, abdominal pain.

## 2019-11-03 NOTE — ED Provider Notes (Signed)
[facility]    CSN: [id number] Arrival date & time: 11/03/19  1003      History   Chief Complaint Chief Complaint  Patient presents with   Vaginal Discharge   Rash    HPI Chelsea Branch is a 54 y.o. female history of hypertension, hyperlipidemia, CHF, tobacco use presenting today for evaluation of rash as well as vaginal discharge.  Patient reports that she has had a rash over the past few weeks to her lower legs.  Reports associated itching.  Has reported some occasional pus drainage out of some lesions.  Only located to lower extremities.  Is unsure if this could be related to her dog.  Reports dog is up-to-date on vaccines.  Does not believe he has fleas.  She has been applying cocoa butter and moisturizing.  She is also concerned as she recently was sexually active with her partner after a long period of abstinence.  She has recently developed urinary frequency, odor, vaginal discharge as well as some lower abdominal discomfort.  She is concerned about possible STDs as she believes he has had multiple partners.  She denies any genital rashes or lesions.  Denies fevers nausea or vomiting.  She also reports increased back pain and her right lower back that goes into her leg, worse with movement and changing positions.  Denies any injury or fall.  Denies any numbness or tingling.  HPI  Past Medical History:  Diagnosis Date   Abnormal thyroid blood test 03/15/2017   Abnormal uterine bleeding    Alopecia    Anemia    Angina pectoris with normal coronary arteriogram ([place]) 2017   Had + Troponin c/w ? 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 iniatially ?2/2 peripartum in 1994 - improved by 2008 then worsening EF in 2011 back down to EF 25-30%. b. echo 01/21/14 showed mod LVH, [name]; 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   NSTEMI (non-ST elevated myocardial infarction) ([place]) - with normal cornaries on Cath 08/67/6195   Systolic and diastolic CHF, acute on chronic ([place]) 06/14/2015   H/O noncompliance with medical treatment, presenting hazards to health    Low back pain 09/16/2014   Homelessness 09/16/2014   Vitamin D deficiency 01/29/2014   OSA (obstructive sleep apnea) 01/08/2014   Chronic combined systolic and diastolic CHF (congestive heart failure) ([place]) 08/14/2013   Essential hypertension    Fibroids 08/09/2012   Menorrhagia 05/08/2012  Female pattern hair loss    HLD (hyperlipidemia) 06/08/2010   CEREBRAL ANEURYSM 06/08/2010   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   CEREBROVASCULAR ACCIDENT, HX OF 06/08/2010    Past Surgical History:  Procedure Laterality Date   CARDIAC CATHETERIZATION N/A 06/16/2015   Procedure: Left Heart Cath and Coronary Angiography;  Surgeon: Jettie Booze, MD;  Location: [facility];  Service: Cardiovascular;  Laterality: N/A;   CESAREAN SECTION  1992  1994   LOOP RECORDER IMPLANT  ~ [place]     TUBAL LIGATION  1994    OB History    Gravida  2   Para  2   Term  2   Preterm      AB      Living  3     SAB      TAB      Ectopic      Multiple  1   Live Births  3            Home Medications    Prior to Admission medications   Medication Sig Start Date End Date Taking? Authorizing Provider  albuterol (VENTOLIN HFA) 108 (90 Base) MCG/ACT inhaler Inhale 1-2 puffs into the lungs every 6 (six) hours as needed for wheezing or shortness of breath. 06/10/19  Yes Raylene Everts, MD  aspirin 81 MG EC tablet Take 1 tablet (81 mg total) by mouth daily. 03/31/16  Yes Milagros Loll, MD  cetirizine (ZYRTEC) 10 MG tablet Take 1 tablet (10 mg total) by mouth daily. 09/13/19  Yes Bast, Traci A, NP  cyclobenzaprine (FLEXERIL) 10 MG tablet Take 1 tablet (10 mg total) by mouth 2 (two) times daily as needed for muscle spasms. 07/14/19  Yes Faustino Congress, NP  diclofenac sodium (VOLTAREN) 1 % GEL Apply 4 g topically 4 (four) times daily. 10/19/18  Yes Bast, Traci A, NP  ibuprofen (ADVIL) 800 MG tablet Take 1 tablet (800 mg total) by mouth every 8 (eight) hours as needed for moderate pain. 07/14/19  Yes Faustino Congress, NP  Magnesium Oxide 400 MG CAPS Take 1 capsule (400 mg total) by mouth 2 (two) times daily. 06/11/19  Yes Richardson Dopp T, PA-C  metoprolol succinate (TOPROL-XL) 50 MG 24 hr tablet TAKE 1 AND A HALF TABLETS DAILY 05/23/19  Yes Deboraha Sprang, MD  potassium chloride SA (KLOR-CON M20) 20 MEQ tablet Take 3 tabs (60 meq) by mouth in the morning and 2 tabs (40 meq) by mouth in the evening 06/11/19  Yes Weaver, Scott T, PA-C  sacubitril-valsartan (ENTRESTO) 49-51 MG Take 1 tablet by mouth 2 (two) times daily. 10/04/19  Yes Deboraha Sprang, MD  simvastatin (ZOCOR) 10 MG tablet TAKE 1 TABLET BY MOUTH EVERY DAY IN THE EVENING 09/28/19  Yes Deboraha Sprang, MD  spironolactone (ALDACTONE) 25 MG tablet TAKE 1/2 TABLET BY MOUTH EVERY DAY 07/30/19   Yes Baldwin Jamaica, PA-C  torsemide (DEMADEX) 20 MG tablet Take 1 tablet (20 mg total) by mouth daily. Take 1 tablet by mouth once a day 03/06/19  Yes Weaver, Nicki Reaper T, PA-C  diphenhydrAMINE (BENADRYL) 25 MG tablet Take 1 tablet (25 mg total) by mouth every 6 (six) hours. 09/13/19   Loura Halt A, NP  doxycycline (VIBRAMYCIN) 100 MG capsule Take 1 capsule (100 mg total) by mouth 2 (two) times daily for 7 days. 11/03/19 11/10/19  Evaristo Tsuda C, PA-C  DULoxetine (CYMBALTA) 30 MG capsule Take 1 capsule (30  mg total) by mouth daily. 12/07/18 10/11/19  Charlott Rakes, MD  Misc. Devices MISC Please provide patient with insurance approved straight cane 09/29/18   Gildardo Pounds, NP  Misc. Devices MISC Single-point cane with adjustable height x1, shower chair x1, transfer bench x1.  Diagnosis congestive heart failure, degenerative disc disease of the lumbar spine. 11/23/18   Charlott Rakes, MD  mupirocin ointment (BACTROBAN) 2 % Apply 1 application topically 2 (two) times daily. 11/03/19   Tangela Dolliver C, PA-C  triamcinolone cream (KENALOG) 0.1 % Apply 1 application topically 2 (two) times daily. 11/03/19   Fayette Hamada C, PA-C  famotidine (PEPCID) 20 MG tablet Take 1 tablet (20 mg total) 2 (two) times daily by mouth. 04/10/17 11/03/19  Albesa Seen, PA-C    Family History Family History  Problem Relation Age of Onset   Cancer Maternal Grandmother        uterine   Hypertension Sister    Healthy Mother    Other Neg Hx    Heart disease Neg Hx     Social History Social History   Tobacco Use   Smoking status: Former Smoker    Packs/day: 0.10    Years: 30.00    Pack years: 3.00    Types: Cigarettes    Quit date: 06/03/2015    Years since quitting: 4.4   Smokeless tobacco: Never Used   Tobacco comment: Pt. stated she stopped smoking a year ago. 09/29/2018  Vaping Use   Vaping Use: Never used  Substance Use Topics   Alcohol use: No    Alcohol/week: 0.0 standard drinks    Drug use: No     Allergies   Ace inhibitors   Review of Systems Review of Systems  Constitutional: Negative for fatigue and fever.  HENT: Negative for mouth sores.   Eyes: Negative for visual disturbance.  Respiratory: Negative for shortness of breath.   Cardiovascular: Negative for chest pain.  Gastrointestinal: Negative for abdominal pain, diarrhea, nausea and vomiting.  Genitourinary: Positive for frequency and vaginal discharge. Negative for dysuria, flank pain, genital sores, hematuria, menstrual problem, vaginal bleeding and vaginal pain.  Musculoskeletal: Positive for back pain. Negative for arthralgias and joint swelling.  Skin: Positive for color change and rash. Negative for wound.  Neurological: Negative for dizziness, weakness, light-headedness and headaches.     Physical Exam Triage Vital Signs ED Triage Vitals  Enc Vitals Group     BP 11/03/19 1012 109/77     Pulse Rate 11/03/19 1012 88     Resp 11/03/19 1012 18     Temp 11/03/19 1012 98.1 F (36.7 C)     Temp Source 11/03/19 1012 Oral     SpO2 11/03/19 1012 98 %     Weight --      Height --      Head Circumference --      Peak Flow --      Pain Score 11/03/19 1013 0     Pain Loc --      Pain Edu? --      Excl. in [place]? --    No data found.  Updated Vital Signs BP 109/77 (BP Location: Right Arm)    Pulse 88    Temp 98.1 F (36.7 C) (Oral)    Resp 18    LMP  (LMP Unknown)    SpO2 98%   Visual Acuity Right Eye Distance:   Left Eye Distance:   Bilateral Distance:    Right Eye Near:  Left Eye Near:    Bilateral Near:     Physical Exam Vitals and nursing note reviewed.  Constitutional:      Appearance: She is well-developed.     Comments: No acute distress  HENT:     Head: Normocephalic and atraumatic.     Nose: Nose normal.  Eyes:     Conjunctiva/sclera: Conjunctivae normal.  Cardiovascular:     Rate and Rhythm: Normal rate.  Pulmonary:     Effort: Pulmonary effort is normal. No  respiratory distress.  Abdominal:     General: There is no distension.  Musculoskeletal:        General: Normal range of motion.     Cervical back: Neck supple.     Comments: Back: Nontender to palpation throughout bilateral lumbar spine and musculature  Ambulating independently without abnormality Strength in hips and knees 5/5 ankle bilaterally   Skin:    General: Skin is warm and dry.     Comments: Lower extremities with multiple hyperpigmented papular lesions without overlying erythema or warmth  Neurological:     Mental Status: She is alert and oriented to person, place, and time.      UC Treatments / Results  Labs (all labs ordered are listed, but only abnormal results are displayed) Labs Reviewed  HIV ANTIBODY (ROUTINE TESTING W REFLEX)  RPR  POCT URINALYSIS DIP (DEVICE)  CERVICOVAGINAL ANCILLARY ONLY    EKG   Radiology No results found.  Procedures Procedures (including critical care time)  Medications Ordered in UC Medications  cefTRIAXone (ROCEPHIN) injection 500 mg (has no administration in time range)    Initial Impression / Assessment and Plan / UC Course  I have reviewed the triage vital signs and the nursing notes.  Pertinent labs & imaging results that were available during my care of the patient were reviewed by me and considered in my medical decision making (see chart for details).     UA with negative leuks and nitrites, negative hemoglobin, suspect more likely vaginal cause of pelvic symptoms.  Given concerns we will go ahead and empirically treat for gonorrhea and chlamydia today with Rocephin and initiate on doxycycline which would also cover for any infectious cause of rash on lower extremities.  Believe rash may be more irritating from scratches versus possible bites.  Providing triamcinolone to use topically with close monitoring.  Bactroban for areas that are open.  Discussed strict return precautions. Patient verbalized understanding  and is agreeable with plan.  Final Clinical Impressions(s) / UC Diagnoses   Final diagnoses:  Vaginal discharge  Rash and nonspecific skin eruption     Discharge Instructions     We have treated you today for gonorrhea, with rocephin. Begin doxycycline twice daily for 1 week to treat chlamydia Please refrain from sexual intercourse for 7 days while medicines eliminating infection.   We are testing you for Gonorrhea, Chlamydia, Trichomonas, Yeast and Bacterial Vaginosis. We will call you if anything is positive and let you know if you require any further treatment. Please inform partners of any positive results.   May use triamcinolone twice daily to areas on leg, may use Bactroban twice daily on areas that are open to help prevent infection  Please return if symptoms not improving with treatment, development of fever, nausea, vomiting, abdominal pain.     ED Prescriptions    Medication Sig Dispense Auth. Provider   doxycycline (VIBRAMYCIN) 100 MG capsule Take 1 capsule (100 mg total) by mouth 2 (two) times daily for  7 days. 14 capsule Anja Neuzil C, PA-C   triamcinolone cream (KENALOG) 0.1 % Apply 1 application topically 2 (two) times daily. 45 g Kyl Givler C, PA-C   mupirocin ointment (BACTROBAN) 2 % Apply 1 application topically 2 (two) times daily. 30 g Sharman Garrott, [place] C, PA-C     PDMP not reviewed this encounter.   Cieanna Stormes, [place] C, PA-C 11/03/19 1051

## 2019-11-04 LAB — RPR: RPR Ser Ql: NONREACTIVE

## 2019-11-07 ENCOUNTER — Telehealth (HOSPITAL_COMMUNITY): Payer: Self-pay | Admitting: Orthopedic Surgery

## 2019-11-07 ENCOUNTER — Ambulatory Visit (HOSPITAL_COMMUNITY)
Admission: EM | Admit: 2019-11-07 | Discharge: 2019-11-07 | Disposition: A | Discharge status: Home / Self Care | Payer: Medicaid Other | Attending: Emergency Medicine | Admitting: Emergency Medicine

## 2019-11-07 ENCOUNTER — Other Ambulatory Visit: Payer: Self-pay

## 2019-11-07 DIAGNOSIS — N898 Other specified noninflammatory disorders of vagina: Secondary | ICD-10-CM | POA: Diagnosis not present

## 2019-11-07 NOTE — Telephone Encounter (Signed)
Pt returned call and notified, states " she will come back to do reswab".

## 2019-11-07 NOTE — ED Notes (Signed)
Lab sent cervicovaginal specimen to the department this morning with note reporting no liquid in vial with q-tip.  Staff member called patient and patient returned to recollect cervicovaginal specimen.  Specimen is in lab, lid secure, liquid in vial, lid intact.  Labeled appropriately

## 2019-11-07 NOTE — Telephone Encounter (Signed)
Notified by RN that lab was unable to process Cervicovaginal specimen. Pt needs to return to clinic for reswab.  Attempted to reach pt, no answer at this time

## 2019-11-08 ENCOUNTER — Telehealth (HOSPITAL_COMMUNITY): Payer: Self-pay | Admitting: Orthopedic Surgery

## 2019-11-08 LAB — CERVICOVAGINAL ANCILLARY ONLY
Bacterial Vaginitis (gardnerella): NEGATIVE
Candida Glabrata: NEGATIVE
Candida Vaginitis: POSITIVE — AB
Chlamydia: NEGATIVE
Comment: NEGATIVE
Comment: NEGATIVE
Comment: NEGATIVE
Comment: NEGATIVE
Comment: NEGATIVE
Comment: NORMAL
Neisseria Gonorrhea: NEGATIVE
Trichomonas: NEGATIVE

## 2019-11-08 MED ORDER — FLUCONAZOLE 150 MG PO TABS
150.0000 mg | ORAL_TABLET | Freq: Every day | ORAL | 0 refills | Status: AC
Start: 2019-11-08 — End: 2019-11-10

## 2019-11-21 ENCOUNTER — Encounter (HOSPITAL_COMMUNITY): Payer: Self-pay

## 2019-11-21 ENCOUNTER — Ambulatory Visit (HOSPITAL_COMMUNITY)
Admission: EM | Admit: 2019-11-21 | Discharge: 2019-11-21 | Disposition: A | Discharge status: Home / Self Care | Payer: Medicaid Other | Attending: Urgent Care | Admitting: Urgent Care

## 2019-11-21 ENCOUNTER — Other Ambulatory Visit: Payer: Self-pay

## 2019-11-21 DIAGNOSIS — M545 Low back pain, unspecified: Secondary | ICD-10-CM

## 2019-11-21 DIAGNOSIS — M5136 Other intervertebral disc degeneration, lumbar region: Secondary | ICD-10-CM

## 2019-11-21 DIAGNOSIS — J4530 Mild persistent asthma, uncomplicated: Secondary | ICD-10-CM

## 2019-11-21 MED ORDER — PREDNISONE 20 MG PO TABS
ORAL_TABLET | ORAL | 0 refills | Status: DC
Start: 2019-11-21 — End: 2019-12-10

## 2019-11-21 MED ORDER — TIZANIDINE HCL 4 MG PO TABS: 4.0000 mg | ORAL_TABLET | Freq: Three times a day (TID) | ORAL | 0 refills | Status: DC | PRN

## 2019-11-21 MED ORDER — ALBUTEROL SULFATE HFA 108 (90 BASE) MCG/ACT IN AERS: 1.0000 | INHALATION_SPRAY | Freq: Four times a day (QID) | RESPIRATORY_TRACT | 0 refills | Status: DC | PRN

## 2019-11-21 NOTE — ED Triage Notes (Signed)
Pt reports lower back pain x 2 days. Pain worse when standing up for extended period of time.

## 2019-11-21 NOTE — ED Provider Notes (Signed)
[place]   MRN: [id number] DOB: 03/26/1966  Subjective:   Chelsea Branch is a 54 y.o. female presenting for 1 week history of recurrent low back pain and stiffness.  Has a history of degenerative disc disease.  She is followed and managed [facility].  States that she has had injections from them but has not been back there in 2 to 3 months.  EF from 07/07/2018 showed 60 to 65%.  Patient denies any falls, trauma, hematuria, weakness, numbness or tingling, radiculopathy.  Symptoms are worse when she stands for long periods of time.  No current facility-administered medications for this encounter.  Current Outpatient Medications:  .  albuterol (VENTOLIN HFA) 108 (90 Base) MCG/ACT inhaler, Inhale 1-2 puffs into the lungs every 6 (six) hours as needed for wheezing or shortness of breath., Disp: 18 g, Rfl: 0 .  aspirin 81 MG EC tablet, Take 1 tablet (81 mg total) by mouth daily., Disp: 30 tablet, Rfl: 0 .  cetirizine (ZYRTEC) 10 MG tablet, Take 1 tablet (10 mg total) by mouth daily., Disp: 30 tablet, Rfl: 0 .  cyclobenzaprine (FLEXERIL) 10 MG tablet, Take 1 tablet (10 mg total) by mouth 2 (two) times daily as needed for muscle spasms., Disp: 30 tablet, Rfl: 3 .  diclofenac sodium (VOLTAREN) 1 % GEL, Apply 4 g topically 4 (four) times daily., Disp: 100 g, Rfl: 1 .  diphenhydrAMINE (BENADRYL) 25 MG tablet, Take 1 tablet (25 mg total) by mouth every 6 (six) hours., Disp: 20 tablet, Rfl: 0 .  DULoxetine (CYMBALTA) 30 MG capsule, Take 1 capsule (30 mg total) by mouth daily., Disp: 30 capsule, Rfl: 3 .  ibuprofen (ADVIL) 800 MG tablet, Take 1 tablet (800 mg total) by mouth 3 (three) times daily., Disp: 21 tablet, Rfl: 0 .  Magnesium Oxide 400 MG CAPS, Take 1 capsule (400 mg total) by mouth 2 (two) times daily., Disp: 60 capsule, Rfl: 3 .  metoprolol succinate (TOPROL-XL) 50 MG 24 hr tablet, TAKE 1 AND A HALF TABLETS DAILY, Disp: 90 tablet, Rfl: 3 .  Misc. Devices MISC,  Please provide patient with insurance approved straight cane, Disp: 1 each, Rfl: 0 .  Misc. Devices MISC, Single-point cane with adjustable height x1, shower chair x1, transfer bench x1.  Diagnosis congestive heart failure, degenerative disc disease of the lumbar spine., Disp: 1 each, Rfl: 0 .  mupirocin ointment (BACTROBAN) 2 %, Apply 1 application topically 2 (two) times daily., Disp: 30 g, Rfl: 0 .  potassium chloride SA (KLOR-CON M20) 20 MEQ tablet, Take 3 tabs (60 meq) by mouth in the morning and 2 tabs (40 meq) by mouth in the evening, Disp: 150 tablet, Rfl: 3 .  sacubitril-valsartan (ENTRESTO) 49-51 MG, Take 1 tablet by mouth 2 (two) times daily., Disp: 180 tablet, Rfl: 1 .  simvastatin (ZOCOR) 10 MG tablet, TAKE 1 TABLET BY MOUTH EVERY DAY IN THE EVENING, Disp: 30 tablet, Rfl: 4 .  spironolactone (ALDACTONE) 25 MG tablet, TAKE 1/2 TABLET BY MOUTH EVERY DAY, Disp: 30 tablet, Rfl: 11 .  torsemide (DEMADEX) 20 MG tablet, Take 1 tablet (20 mg total) by mouth daily. Take 1 tablet by mouth once a day, Disp: 90 tablet, Rfl: 3 .  triamcinolone cream (KENALOG) 0.1 %, Apply 1 application topically 2 (two) times daily., Disp: 45 g, Rfl: 0   Allergies  Allergen Reactions  . Ace Inhibitors Other (See Comments)    REACTION: Cough    Past Medical History:  Diagnosis Date  .  Abnormal thyroid blood test 03/15/2017  . Abnormal uterine bleeding   . Alopecia   . Anemia   . Angina pectoris with normal coronary arteriogram ([place]) 2017   Had + Troponin c/w ? NSTEMI due to A on C CHF  . 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 Chronic combined systolic and diastolic CHF (congestive heart failure) ([place])   . DYSLIPIDEMIA   . Essential hypertension   . Fibroids   . H/O noncompliance with medical treatment, presenting hazards to health   . Hypertension   . Hypokalemia 07/23/2016  . Menorrhagia   . 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 iniatially ?2/2 peripartum in 1994 - improved by 2008 then  worsening EF in 2011 back down to EF 25-30%. b. echo 01/21/14 showed mod LVH, [name]; [name], [name], [name],   . 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 NSTEMI (non-ST elevated myocardial infarction) ([place]) 05/2015   Normal Coronaries.  . Peripartum cardiomyopathy 1994  . Sleep apnea 2015   CPAP 12/2013  . Stroke [facility]) 2011  . Systolic and diastolic CHF, acute on chronic ([place]) 06/14/2015  . Tobacco abuse      Past Surgical History:  Procedure Laterality Date  . CARDIAC CATHETERIZATION N/A 06/16/2015   Procedure: Left Heart Cath and Coronary Angiography;  Surgeon: Jettie Booze, MD;  Location: [place] [facility];  Service: Cardiovascular;  Laterality: N/A;  . [place]  . LOOP RECORDER IMPLANT  ~ 2000  . TIBIAL TUBERCLERPLASTY    . TUBAL LIGATION  1994    Family History  Problem Relation Age of Onset  . Cancer Maternal Grandmother        uterine  . Hypertension Sister   . Healthy Mother   . Other Neg Hx   . Heart disease Neg Hx     Social History   Tobacco Use  . Smoking status: Former Smoker    Packs/day: 0.10    Years: 30.00    Pack years: 3.00    Types: Cigarettes    Quit date: 06/03/2015    Years since quitting: 4.4  . Smokeless tobacco: Never Used  . Tobacco comment: Pt. stated she stopped smoking a year ago. 09/29/2018  Vaping Use  . Vaping Use: Never used  Substance Use Topics  . Alcohol use: No    Alcohol/week: 0.0 standard drinks  . Drug use: No    ROS   Objective:   Vitals: BP 104/71 (BP Location: Right Arm)   Pulse 84   Temp 98.1 F (36.7 C) (Oral)   Resp 20   LMP  (LMP Unknown)   SpO2 99%   Physical Exam Constitutional:      General: She is not in acute distress.    Appearance: Normal appearance. She is well-developed. She is obese. She is not ill-appearing, toxic-appearing or diaphoretic.  HENT:     Head: Normocephalic and atraumatic.     Nose: Nose normal.      Mouth/Throat:     Mouth: Mucous membranes are moist.     Pharynx: Oropharynx is clear.  Eyes:     General: No scleral icterus.       Right eye: No discharge.        Left eye: No discharge.     Extraocular Movements: Extraocular movements intact.     Conjunctiva/sclera: Conjunctivae normal.     Pupils: Pupils are equal, round, and reactive to light.  Cardiovascular:  Rate and Rhythm: Normal rate.  Pulmonary:     Effort: Pulmonary effort is normal.  Musculoskeletal:     Lumbar back: Tenderness (over area outlined) present. No swelling, edema, deformity, signs of trauma, lacerations, spasms or bony tenderness. Normal range of motion. Negative right straight leg raise test and negative left straight leg raise test. No scoliosis.       Back:  Skin:    General: Skin is warm and dry.  Neurological:     General: No focal deficit present.     Mental Status: She is alert and oriented to person, place, and time.     Motor: No weakness.     Coordination: Coordination normal.     Gait: Gait normal.     Deep Tendon Reflexes: Reflexes normal.  Psychiatric:        Mood and Affect: Mood normal.        Behavior: Behavior normal.        Thought Content: Thought content normal.        Judgment: Judgment normal.     Assessment and Plan :   PDMP not reviewed this encounter.  1. Acute left-sided low back pain without sciatica   2. Degenerative disc disease, lumbar   3. Mild persistent asthma without complication     Recommended prednisone course for an arthritis flare of her lumbar degenerative disc disease.  Use tizanidine as needed.  Follow-up with spine specialist ASAP.  At the end of her visit, patient requested a refill of her albuterol inhaler.  Recommended follow-up with her PCP for further management. Counseled patient on potential for adverse effects with medications prescribed/recommended today, ER and return-to-clinic precautions discussed, patient verbalized understanding.      Jaynee Eagles, [place] 11/21/19 [phone]

## 2019-11-21 NOTE — Discharge Instructions (Signed)
Please follow-up with your spine specialist for recheck regarding your arthritis in your back.  Today, we are using a prednisone course and providing you with a muscle relaxant to help you with some arthritis flare.  I have also refilled your albuterol inhaler for your asthma.  Make sure you follow-up with your PCP for a recheck and management of your asthma.

## 2019-12-06 ENCOUNTER — Telehealth: Payer: Self-pay | Admitting: Family Medicine

## 2019-12-06 DIAGNOSIS — L659 Nonscarring hair loss, unspecified: Secondary | ICD-10-CM

## 2019-12-06 NOTE — Addendum Note (Signed)
Addended by: Charlott Rakes on: 12/06/2019 06:36 PM   Modules accepted: Orders

## 2019-12-06 NOTE — Telephone Encounter (Signed)
Please follow up  Copied from [place] [phone]. Topic: General - Other >> Dec 06, 2019  1:52 PM Wynetta Emery, [place] C wrote: Reason for CRM: pt called in to ask for a referral to a dermatologist. Pt says that her hair is falling out and she has spots in her head.

## 2019-12-06 NOTE — Telephone Encounter (Signed)
Will route to PCP for review. 

## 2019-12-06 NOTE — Telephone Encounter (Signed)
Referral placed.

## 2019-12-07 NOTE — Telephone Encounter (Signed)
Patient has been called and informed of referral being placed. 

## 2019-12-10 ENCOUNTER — Ambulatory Visit (HOSPITAL_COMMUNITY)
Admission: EM | Admit: 2019-12-10 | Discharge: 2019-12-10 | Disposition: A | Discharge status: Home / Self Care | Payer: Medicaid Other | Attending: Emergency Medicine | Admitting: Emergency Medicine

## 2019-12-10 ENCOUNTER — Other Ambulatory Visit: Payer: Self-pay

## 2019-12-10 ENCOUNTER — Encounter (HOSPITAL_COMMUNITY): Payer: Self-pay

## 2019-12-10 DIAGNOSIS — M545 Low back pain, unspecified: Secondary | ICD-10-CM

## 2019-12-10 DIAGNOSIS — M25571 Pain in right ankle and joints of right foot: Secondary | ICD-10-CM | POA: Diagnosis not present

## 2019-12-10 MED ORDER — KETOROLAC TROMETHAMINE 30 MG/ML IJ SOLN
30.0000 mg | Freq: Once | INTRAMUSCULAR | Status: AC
  Administered 2019-12-10: 30 mg via INTRAMUSCULAR

## 2019-12-10 MED ORDER — KETOROLAC TROMETHAMINE 30 MG/ML IJ SOLN
INTRAMUSCULAR | Status: AC
  Filled 2019-12-10: qty 1

## 2019-12-10 MED ORDER — NAPROXEN 500 MG PO TABS
500.0000 mg | ORAL_TABLET | Freq: Two times a day (BID) | ORAL | 0 refills | Status: DC
Start: 2019-12-10 — End: 2020-02-11

## 2019-12-10 MED ORDER — HYDROCODONE-ACETAMINOPHEN 5-325 MG PO TABS: 1.0000 | ORAL_TABLET | Freq: Four times a day (QID) | ORAL | 0 refills | Status: DC | PRN

## 2019-12-10 MED ORDER — CYCLOBENZAPRINE HCL 5 MG PO TABS: 5.0000 mg | ORAL_TABLET | Freq: Two times a day (BID) | ORAL | 0 refills | Status: DC | PRN

## 2019-12-10 NOTE — Discharge Instructions (Signed)
We gave you an injection of Toradol to help with pain in back\ankle Continue with Naprosyn twice daily with food You may use flexeril as needed to help with pain. This is a muscle relaxer and causes sedation- please use only at bedtime or when you will be home and not have to drive/work May use hydrocodone for severe pain - do not drive or work after taking, will cause drowsiness  Follow up if not improving or worsening

## 2019-12-10 NOTE — ED Triage Notes (Signed)
Pt c/o 10/10 throbbing pain in lower back. Pt denies numbness and tingling. Pt states her left ankle twisted 2 days ago as she was walking. Pt c/o 8/10 throbbing pain in right ankle. Pt has 1+ swelling of right ankle. Pt walked well to triage.

## 2019-12-11 ENCOUNTER — Other Ambulatory Visit: Payer: Self-pay | Admitting: Physician Assistant

## 2019-12-11 NOTE — ED Provider Notes (Signed)
[place]    CSN: [id number] Arrival date & time: 12/10/19  1329      History   Chief Complaint Chief Complaint  Patient presents with  . back pain, ankle injury    HPI Chelsea Branch is a 54 y.o. female history of CAD, combined CHF, tobacco use, hypertension, presenting today for evaluation of back pain and ankle pain.  Patient reports that she may have twisted her ankle a couple days ago while walking, but denies specific injury fall or trauma.  Since she has had some pain and swelling in her right ankle and because of this has flared up her chronic lower back pain.  She has had a lot of pain with movement and twisting.  Denies any urinary symptoms of dysuria, incomplete voiding, frequency or hematuria.  She denies concern for fracture with her ankle.  Her main complaint is her back.  Denies numbness or tingling, denies radiation into lower extremities.  HPI  Past Medical History:  Diagnosis Date  . Abnormal thyroid blood test 03/15/2017  . Abnormal uterine bleeding   . Alopecia   . Anemia   . Angina pectoris with normal coronary arteriogram ([place]) 2017   Had + Troponin c/w ? NSTEMI due to A on C CHF  . 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 Chronic combined systolic and diastolic CHF (congestive heart failure) ([place])   . DYSLIPIDEMIA   . Essential hypertension   . Fibroids   . H/O noncompliance with medical treatment, presenting hazards to health   . Hypertension   . Hypokalemia 07/23/2016  . Menorrhagia   . 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 iniatially ?2/2 peripartum in 1994 - improved by 2008 then worsening EF in 2011 back down to EF 25-30%. b. echo 01/21/14 showed mod LVH, [name]; [name], [name], [name],   . 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 NSTEMI (non-ST elevated myocardial infarction) ([place]) 05/2015   Normal Coronaries.  . Peripartum cardiomyopathy 1994  . Sleep apnea 2015   CPAP 12/2013  .  Stroke [facility]) 2011  . Systolic and diastolic CHF, acute on chronic ([place]) 06/14/2015  . Tobacco abuse     Patient Active Problem List   Diagnosis Date Noted  . Degenerative disc disease at L5-S1 level 10/10/2018  . Abnormal thyroid blood test 03/15/2017  . Hypokalemia 07/23/2016  . Low TSH level 04/26/2016  . Abnormal uterine bleeding 04/06/2016  . Nonspecific chest pain   . 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 NSTEMI (non-ST elevated myocardial infarction) ([place]) - with normal cornaries on Cath 06/14/2015  . Systolic and diastolic CHF, acute on chronic ([place]) 06/14/2015  . H/O noncompliance with medical treatment, presenting hazards to health   . Low back pain 09/16/2014  . Homelessness 09/16/2014  . Vitamin D deficiency 01/29/2014  . OSA (obstructive sleep apnea) 01/08/2014  . Chronic combined systolic and diastolic CHF (congestive heart failure) ([place]) 08/14/2013  . Essential hypertension   . Fibroids 08/09/2012  . Menorrhagia 05/08/2012  . Female pattern hair loss   . HLD (hyperlipidemia) 06/08/2010  . CEREBRAL ANEURYSM 06/08/2010  . 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 CEREBROVASCULAR ACCIDENT, HX OF 06/08/2010    Past Surgical History:  Procedure Laterality Date  . CARDIAC CATHETERIZATION N/A 06/16/2015   Procedure: Left Heart Cath and Coronary Angiography;  Surgeon: Jettie Booze, MD;  Location: [place] [facility];  Service: Cardiovascular;  Laterality: N/A;  . [place]  [place]  ~ 2000  . TIBIAL TUBERCLERPLASTY    . TUBAL LIGATION  1994    OB History    Gravida  2   Para  2   Term  2   Preterm      AB      Living  3     SAB      TAB      Ectopic      Multiple  1   Live Births  3            Home Medications    Prior to Admission medications   Medication Sig Start Date End Date Taking? Authorizing Provider  albuterol (VENTOLIN HFA) 108 (90 Base) MCG/ACT inhaler Inhale 1-2 puffs  into the lungs every 6 (six) hours as needed for wheezing or shortness of breath. 11/21/19   Jaynee Eagles, PA-C  aspirin 81 MG EC tablet Take 1 tablet (81 mg total) by mouth daily. 03/31/16   Milagros Loll, MD  cetirizine (ZYRTEC) 10 MG tablet Take 1 tablet (10 mg total) by mouth daily. 09/13/19   Loura Halt A, NP  cyclobenzaprine (FLEXERIL) 5 MG tablet Take 1-2 tablets (5-10 mg total) by mouth 2 (two) times daily as needed for muscle spasms. 12/10/19   Devarious Pavek C, PA-C  diclofenac sodium (VOLTAREN) 1 % GEL Apply 4 g topically 4 (four) times daily. 10/19/18   Loura Halt A, NP  diphenhydrAMINE (BENADRYL) 25 MG tablet Take 1 tablet (25 mg total) by mouth every 6 (six) hours. 09/13/19   Loura Halt A, NP  HYDROcodone-acetaminophen (NORCO/VICODIN) 5-325 MG tablet Take 1-2 tablets by mouth every 6 (six) hours as needed. 12/10/19   Reta Norgren C, PA-C  Magnesium Oxide 400 MG CAPS Take 1 capsule (400 mg total) by mouth 2 (two) times daily. 06/11/19   Richardson Dopp T, PA-C  metoprolol succinate (TOPROL-XL) 50 MG 24 hr tablet TAKE 1 AND A HALF TABLETS DAILY 05/23/19   Deboraha Sprang, MD  Misc. Devices MISC Please provide patient with insurance approved straight cane 09/29/18   Gildardo Pounds, NP  Misc. Devices MISC Single-point cane with adjustable height x1, shower chair x1, transfer bench x1.  Diagnosis congestive heart failure, degenerative disc disease of the lumbar spine. 11/23/18   Charlott Rakes, MD  mupirocin ointment (BACTROBAN) 2 % Apply 1 application topically 2 (two) times daily. 11/03/19   Keevan Wolz C, PA-C  naproxen (NAPROSYN) 500 MG tablet Take 1 tablet (500 mg total) by mouth 2 (two) times daily. 12/10/19   Cleland Simkins C, PA-C  potassium chloride SA (KLOR-CON M20) 20 MEQ tablet Take 3 tabs (60 meq) by mouth in the morning and 2 tabs (40 meq) by mouth in the evening 06/11/19   Kathlen Mody, Nicki Reaper T, PA-C  sacubitril-valsartan (ENTRESTO) 49-51 MG Take 1 tablet by mouth 2 (two) times  daily. 10/04/19   Deboraha Sprang, MD  simvastatin (ZOCOR) 10 MG tablet TAKE 1 TABLET BY MOUTH EVERY DAY IN THE EVENING 09/28/19   Deboraha Sprang, MD  spironolactone (ALDACTONE) 25 MG tablet TAKE 1/2 TABLET BY MOUTH EVERY DAY 07/30/19   Baldwin Jamaica, PA-C  torsemide (DEMADEX) 20 MG tablet Take 1 tablet (20 mg total) by mouth daily. Take 1 tablet by mouth once a day 03/06/19   Richardson Dopp T, PA-C  triamcinolone cream (KENALOG) 0.1 % Apply 1 application topically 2 (two) times daily. 11/03/19   Mickenzie Stolar, Elesa Hacker, PA-C  DULoxetine (CYMBALTA) 30 MG capsule Take 1 capsule (30 mg total) by mouth daily. 12/07/18 12/10/19  Charlott Rakes, MD  famotidine (PEPCID) 20 MG tablet Take 1 tablet (20 mg total) 2 (two) times daily by mouth. 04/10/17 11/03/19  Albesa Seen, PA-C    Family History Family History  Problem Relation Age of Onset  . Cancer Maternal Grandmother        uterine  . Hypertension Sister   . Healthy Mother   . Other Neg Hx   . Heart disease Neg Hx     Social History Social History   Tobacco Use  . Smoking status: Former Smoker    Packs/day: 0.10    Years: 30.00    Pack years: 3.00    Types: Cigarettes    Quit date: 06/03/2015    Years since quitting: 4.5  . Smokeless tobacco: Never Used  . Tobacco comment: Pt. stated she stopped smoking a year ago. 09/29/2018  Vaping Use  . Vaping Use: Never used  Substance Use Topics  . Alcohol use: No    Alcohol/week: 0.0 standard drinks  . Drug use: No     Allergies   Ace inhibitors   Review of Systems Review of Systems  Constitutional: Negative for activity change, chills, diaphoresis and fatigue.  HENT: Negative for ear pain, tinnitus and trouble swallowing.   Eyes: Negative for photophobia and visual disturbance.  Respiratory: Negative for cough, chest tightness and shortness of breath.   Cardiovascular: Negative for chest pain and leg swelling.  Gastrointestinal: Negative for abdominal pain, blood in stool, nausea  and vomiting.  Musculoskeletal: Positive for arthralgias, back pain, gait problem and myalgias. Negative for neck pain and neck stiffness.  Skin: Negative for color change and wound.  Neurological: Negative for dizziness, weakness, light-headedness, numbness and headaches.     Physical Exam Triage Vital Signs ED Triage Vitals  Enc Vitals Group     BP 12/10/19 1344 112/78     Pulse Rate 12/10/19 1344 90     Resp 12/10/19 1344 16     Temp 12/10/19 1344 98.1 F (36.7 C)     Temp Source 12/10/19 1344 Oral     SpO2 12/10/19 1344 95 %     Weight 12/10/19 1348 211 lb (95.7 kg)     Height 12/10/19 1348 5\' 8"  (1.727 m)     Head Circumference --      Peak Flow --      Pain Score 12/10/19 1348 8     Pain Loc --      Pain Edu? --      Excl. in [place]? --    No data found.  Updated Vital Signs BP 112/78   Pulse 90   Temp 98.1 F (36.7 C) (Oral)   Resp 16   Ht 5\' 8"  (1.727 m)   Wt 211 lb (95.7 kg)   SpO2 95%   BMI 32.08 kg/m   Visual Acuity Right Eye Distance:   Left Eye Distance:   Bilateral Distance:    Right Eye Near:   Left Eye Near:    Bilateral Near:     Physical Exam Vitals and nursing note reviewed.  Constitutional:      Appearance: She is well-developed.     Comments: No acute distress  HENT:     Head: Normocephalic and atraumatic.     Nose: Nose normal.  Eyes:     Conjunctiva/sclera: Conjunctivae normal.  Cardiovascular:     Rate and Rhythm: Normal  rate.  Pulmonary:     Effort: Pulmonary effort is normal. No respiratory distress.  Abdominal:     General: There is no distension.  Musculoskeletal:        General: Normal range of motion.     Cervical back: Neck supple.     Comments: Patient ambulates from chair to exam table slowly, but without assistance Tender diffusely throughout lumbar spine midline and throughout bilateral lumbar musculature Strength at hips and knees 5/5 and equal bilaterally, pain elicited with resisted hip flexion Patellar reflex  2+  Right ankle: Mild swelling noted about lateral malleolus compared to left, no discoloration, nontender to palpation over lateral malleolus, nontender throughout dorsum of left foot, nontender along Achilles Dorsalis pedis 2+  Skin:    General: Skin is warm and dry.  Neurological:     Mental Status: She is alert and oriented to person, place, and time.      UC Treatments / Results  Labs (all labs ordered are listed, but only abnormal results are displayed) Labs Reviewed - No data to display  EKG   Radiology No results found.  Procedures Procedures (including critical care time)  Medications Ordered in UC Medications  ketorolac (TORADOL) 30 MG/ML injection 30 mg (30 mg Intramuscular Given 12/10/19 1450)    Initial Impression / Assessment and Plan / UC Course  I have reviewed the triage vital signs and the nursing notes.  Pertinent labs & imaging results that were available during my care of the patient were reviewed by me and considered in my medical decision making (see chart for details).     Acute on chronic back pain, will provide Toradol and Naprosyn as needed.  Flexeril as patient has had relief with this in the past.  Did provide patient with 2 days worth of hydrocodone to use for severe pain.  Advised to use sparingly and to not drive or work after taking.  No specific mechanism of injury triggering ankle pain, suspect likely sprain.  Will continue with NSAIDs, ice elevation and Ace wrap as needed.  Discussed strict return precautions. Patient verbalized understanding and is agreeable with plan.  Final Clinical Impressions(s) / UC Diagnoses   Final diagnoses:  Acute bilateral low back pain without sciatica  Acute right ankle pain     Discharge Instructions     We gave you an injection of Toradol to help with pain in back\ankle Continue with Naprosyn twice daily with food You may use flexeril as needed to help with pain. This is a muscle relaxer and  causes sedation- please use only at bedtime or when you will be home and not have to drive/work May use hydrocodone for severe pain - do not drive or work after taking, will cause drowsiness  Follow up if not improving or worsening     ED Prescriptions    Medication Sig Dispense Auth. Provider   naproxen (NAPROSYN) 500 MG tablet Take 1 tablet (500 mg total) by mouth 2 (two) times daily. 30 tablet Brewster Wolters C, PA-C   cyclobenzaprine (FLEXERIL) 5 MG tablet Take 1-2 tablets (5-10 mg total) by mouth 2 (two) times daily as needed for muscle spasms. 24 tablet Fariha Goto C, PA-C   HYDROcodone-acetaminophen (NORCO/VICODIN) 5-325 MG tablet Take 1-2 tablets by mouth every 6 (six) hours as needed. 10 tablet Danthony Kendrix, [place] C, PA-C     I have reviewed the PDMP during this encounter.   Joneen Caraway [place] C, [place] 12/11/19 [phone]

## 2019-12-12 ENCOUNTER — Telehealth: Payer: Self-pay | Admitting: Family Medicine

## 2019-12-12 NOTE — Telephone Encounter (Signed)
Please follow up  Copied from [place] [phone]. Topic: General - Other >> Dec 06, 2019  1:52 PM Antonieta Iba C wrote: Reason for CRM: pt called in to ask for a referral to a dermatologist. Pt says that her hair is falling out and she has spots in her head. >> Dec 12, 2019  3:41 PM Alanda Slim E wrote: Pt asked for a referral to another dermatologist in [place]/ Pt states she knows nothing about [place]/ please advise

## 2019-12-13 ENCOUNTER — Telehealth: Payer: Self-pay | Admitting: Family Medicine

## 2019-12-13 NOTE — Telephone Encounter (Signed)
Copied from [place] [phone]. Topic: General - Other >> Dec 06, 2019  1:52 PM Antonieta Iba C wrote: Reason for CRM: pt called in to ask for a referral to a dermatologist. Pt says that her hair is falling out and she has spots in her head. >> Dec 12, 2019  3:41 PM Alanda Slim E wrote: Pt asked for a referral to another dermatologist in [place]/ Pt states she knows nothing about [place]/ please advise

## 2019-12-13 NOTE — Telephone Encounter (Signed)
Unfortunately we don't have nobody in [place] that accept Medicaid  [facility] used to have someone in [place]  but not at the moment .

## 2019-12-16 ENCOUNTER — Other Ambulatory Visit: Payer: Self-pay | Admitting: Physician Assistant

## 2019-12-18 NOTE — Telephone Encounter (Signed)
Patient was called and informed that [facility] is the only place at this time accepting medicaid.

## 2020-01-07 ENCOUNTER — Other Ambulatory Visit: Payer: Self-pay

## 2020-01-07 MED ORDER — METOPROLOL SUCCINATE ER 50 MG PO TB24: ORAL_TABLET | ORAL | 2 refills | Status: DC

## 2020-01-20 ENCOUNTER — Other Ambulatory Visit: Payer: Self-pay | Admitting: Physician Assistant

## 2020-01-21 ENCOUNTER — Ambulatory Visit: Payer: Medicaid Other | Admitting: Obstetrics & Gynecology

## 2020-01-23 ENCOUNTER — Ambulatory Visit: Payer: Medicaid Other | Admitting: Family Medicine

## 2020-02-11 ENCOUNTER — Ambulatory Visit (HOSPITAL_COMMUNITY)
Admission: EM | Admit: 2020-02-11 | Discharge: 2020-02-11 | Disposition: A | Discharge status: Home / Self Care | Payer: Medicaid Other | Attending: Internal Medicine | Admitting: Internal Medicine

## 2020-02-11 ENCOUNTER — Encounter (HOSPITAL_COMMUNITY): Payer: Self-pay | Admitting: Emergency Medicine

## 2020-02-11 ENCOUNTER — Other Ambulatory Visit: Payer: Self-pay

## 2020-02-11 DIAGNOSIS — J029 Acute pharyngitis, unspecified: Secondary | ICD-10-CM | POA: Insufficient documentation

## 2020-02-11 DIAGNOSIS — R0601 Orthopnea: Secondary | ICD-10-CM | POA: Insufficient documentation

## 2020-02-11 DIAGNOSIS — Z7901 Long term (current) use of anticoagulants: Secondary | ICD-10-CM | POA: Diagnosis not present

## 2020-02-11 DIAGNOSIS — I5042 Chronic combined systolic (congestive) and diastolic (congestive) heart failure: Secondary | ICD-10-CM | POA: Diagnosis not present

## 2020-02-11 DIAGNOSIS — Z79899 Other long term (current) drug therapy: Secondary | ICD-10-CM | POA: Diagnosis not present

## 2020-02-11 DIAGNOSIS — Z87891 Personal history of nicotine dependence: Secondary | ICD-10-CM | POA: Diagnosis not present

## 2020-02-11 DIAGNOSIS — M5441 Lumbago with sciatica, right side: Secondary | ICD-10-CM | POA: Diagnosis not present

## 2020-02-11 DIAGNOSIS — I252 Old myocardial infarction: Secondary | ICD-10-CM | POA: Insufficient documentation

## 2020-02-11 DIAGNOSIS — Z7982 Long term (current) use of aspirin: Secondary | ICD-10-CM | POA: Insufficient documentation

## 2020-02-11 DIAGNOSIS — Z20822 Contact with and (suspected) exposure to covid-19: Secondary | ICD-10-CM | POA: Insufficient documentation

## 2020-02-11 DIAGNOSIS — Z8673 Personal history of transient ischemic attack (TIA), and cerebral infarction without residual deficits: Secondary | ICD-10-CM | POA: Insufficient documentation

## 2020-02-11 DIAGNOSIS — R0602 Shortness of breath: Secondary | ICD-10-CM | POA: Diagnosis not present

## 2020-02-11 DIAGNOSIS — G4733 Obstructive sleep apnea (adult) (pediatric): Secondary | ICD-10-CM | POA: Insufficient documentation

## 2020-02-11 DIAGNOSIS — G8929 Other chronic pain: Secondary | ICD-10-CM | POA: Insufficient documentation

## 2020-02-11 DIAGNOSIS — R05 Cough: Secondary | ICD-10-CM | POA: Insufficient documentation

## 2020-02-11 DIAGNOSIS — J069 Acute upper respiratory infection, unspecified: Secondary | ICD-10-CM | POA: Insufficient documentation

## 2020-02-11 DIAGNOSIS — M5137 Other intervertebral disc degeneration, lumbosacral region: Secondary | ICD-10-CM | POA: Insufficient documentation

## 2020-02-11 DIAGNOSIS — M5440 Lumbago with sciatica, unspecified side: Secondary | ICD-10-CM

## 2020-02-11 DIAGNOSIS — I110 Hypertensive heart disease with heart failure: Secondary | ICD-10-CM | POA: Insufficient documentation

## 2020-02-11 DIAGNOSIS — E785 Hyperlipidemia, unspecified: Secondary | ICD-10-CM | POA: Insufficient documentation

## 2020-02-11 MED ORDER — DICLOFENAC SODIUM 1 % EX GEL: 4.0000 g | Freq: Four times a day (QID) | CUTANEOUS | 0 refills | Status: DC

## 2020-02-11 MED ORDER — TIZANIDINE HCL 2 MG PO CAPS: 2.0000 mg | ORAL_CAPSULE | Freq: Every day | ORAL | 0 refills | Status: AC

## 2020-02-11 MED ORDER — ACETAMINOPHEN 325 MG PO TABS
650.0000 mg | ORAL_TABLET | Freq: Four times a day (QID) | ORAL | 0 refills | Status: AC | PRN
Start: 2020-02-11 — End: 2020-02-21

## 2020-02-11 MED ORDER — CEPACOL SORE THROAT 5.4 MG MT LOZG
1.0000 | LOZENGE | OROMUCOSAL | 0 refills | Status: DC | PRN
Start: 2020-02-11 — End: 2021-07-21

## 2020-02-11 NOTE — ED Triage Notes (Signed)
Pt presents with sore throat, sob, cough, ear pain xs 1 day. Denies fever. C/o lower back pain on right side that goes into leg.   Denies COVID test at this time.

## 2020-02-11 NOTE — ED Provider Notes (Signed)
[place]    CSN: [id number] Arrival date & time: 02/11/20  3662      History   Chief Complaint Chief Complaint  Patient presents with  . Otalgia  . Sore Throat    HPI Chelsea Branch is a 54 y.o. female.   Patient with history of heart failure chronic back pain presents for right-sided low back pain, cough, runny nose shortness of breath at night.  She reports her back is been giving her issues for some time.  She has been taking ibuprofen for this with some relief.  Pain is kept her up over the last few nights in combination with feeling short of breath when laying down.  She reports when she has been laying down over the last 3 nights she is felt a little more short of breath.  She reports when she sits she breathes better.  She reports this is not been every night, just 2 of the last 3.  Denies shortness of breath in clinic or up and walking around other than her baseline fatigue and shortness of breath.  She reports he has had a cough for 1 to 2 weeks.  Has been nonproductive.  In the duration she is also had a slight runny nose.  Occasional sore throat.  She has also had some left-sided ear pressure and fullness.  She denies chest pain.  She did have a little bit of ankle swelling a few days ago, this is resolved.  She has been taking all her medicines as prescribed.  Denies nausea, vomiting, abdominal pain.  Denies change in her urinary patterns, frequency urgency or painful urination.  Moving her bowels per usual.  Denies fever or chills.  Has recovered from Covid a few months ago.  Received her Covid vaccines.  She states her primary concern is her back pain today and she believes this is impacting her rest.     Past Medical History:  Diagnosis Date  . Abnormal thyroid blood test 03/15/2017  . Abnormal uterine bleeding   . Alopecia   . Anemia   . Angina pectoris with normal coronary arteriogram ([place]) 2017   Had + Troponin c/w ? NSTEMI due to A on C CHF  .  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 Chronic combined systolic and diastolic CHF (congestive heart failure) ([place])   . DYSLIPIDEMIA   . Essential hypertension   . Fibroids   . H/O noncompliance with medical treatment, presenting hazards to health   . Hypertension   . Hypokalemia 07/23/2016  . Menorrhagia   . 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 iniatially ?2/2 peripartum in 1994 - improved by 2008 then worsening EF in 2011 back down to EF 25-30%. b. echo 01/21/14 showed mod LVH, [name]; [name], [name], [name],   . 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 NSTEMI (non-ST elevated myocardial infarction) ([place]) 05/2015   Normal Coronaries.  . Peripartum cardiomyopathy 1994  . Sleep apnea 2015   CPAP 12/2013  . Stroke [facility]) 2011  . Systolic and diastolic CHF, acute on chronic ([place]) 06/14/2015  . Tobacco abuse     Patient Active Problem List   Diagnosis Date Noted  . Degenerative disc disease at L5-S1 level 10/10/2018  . Abnormal thyroid blood test 03/15/2017  . Hypokalemia 07/23/2016  . Low TSH level 04/26/2016  . Abnormal uterine bleeding 04/06/2016  . Nonspecific chest pain   . 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 NSTEMI (  non-ST elevated myocardial infarction) ([place]) - with normal cornaries on Cath 06/14/2015  . Systolic and diastolic CHF, acute on chronic ([place]) 06/14/2015  . H/O noncompliance with medical treatment, presenting hazards to health   . Low back pain 09/16/2014  . Homelessness 09/16/2014  . Vitamin D deficiency 01/29/2014  . OSA (obstructive sleep apnea) 01/08/2014  . Chronic combined systolic and diastolic CHF (congestive heart failure) ([place]) 08/14/2013  . Essential hypertension   . Fibroids 08/09/2012  . Menorrhagia 05/08/2012  . Female pattern hair loss   . HLD (hyperlipidemia) 06/08/2010  . CEREBRAL ANEURYSM 06/08/2010  . 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 CEREBROVASCULAR ACCIDENT, HX  OF 06/08/2010    Past Surgical History:  Procedure Laterality Date  . CARDIAC CATHETERIZATION N/A 06/16/2015   Procedure: Left Heart Cath and Coronary Angiography;  Surgeon: Jettie Booze, MD;  Location: [facility];  Service: Cardiovascular;  Laterality: N/A;  . [place]  . LOOP RECORDER IMPLANT  ~ 2000  . TIBIAL TUBERCLERPLASTY    . TUBAL LIGATION  1994    OB History    Gravida  2   Para  2   Term  2   Preterm      AB      Living  3     SAB      TAB      Ectopic      Multiple  1   Live Births  3            Home Medications    Prior to Admission medications   Medication Sig Start Date End Date Taking? Authorizing Provider  acetaminophen (TYLENOL) 325 MG tablet Take 2 tablets (650 mg total) by mouth every 6 (six) hours as needed for up to 10 days. 02/11/20 02/21/20  Julieann Drummonds, Marguerita Beards, PA-C  albuterol (VENTOLIN HFA) 108 (90 Base) MCG/ACT inhaler Inhale 1-2 puffs into the lungs every 6 (six) hours as needed for wheezing or shortness of breath. 11/21/19   Jaynee Eagles, PA-C  aspirin 81 MG EC tablet Take 1 tablet (81 mg total) by mouth daily. 03/31/16   Milagros Loll, MD  cetirizine (ZYRTEC) 10 MG tablet Take 1 tablet (10 mg total) by mouth daily. 09/13/19   Loura Halt A, NP  diclofenac Sodium (VOLTAREN) 1 % GEL Apply 4 g topically 4 (four) times daily. 02/11/20   Ski Polich, Marguerita Beards, PA-C  diphenhydrAMINE (BENADRYL) 25 MG tablet Take 1 tablet (25 mg total) by mouth every 6 (six) hours. 09/13/19   Bast, Tressia Miners A, NP  magnesium oxide (MAG-OX) 400 MG tablet TAKE 1 CAPSULE (400 MG TOTAL) BY MOUTH 2 (TWO) TIMES DAILY. 01/22/20   Deboraha Sprang, MD  Menthol (CEPACOL SORE THROAT) 5.4 MG LOZG Use as directed 1 lozenge (5.4 mg total) in the mouth or throat every 2 (two) hours as needed. 02/11/20   Rhian Asebedo, Marguerita Beards, PA-C  metoprolol succinate (TOPROL-XL) 50 MG 24 hr tablet TAKE 1 AND A HALF TABLETS BY MOUTH DAILY 01/07/20   Deboraha Sprang, MD  Misc. Devices  MISC Please provide patient with insurance approved straight cane 09/29/18   Gildardo Pounds, NP  Misc. Devices MISC Single-point cane with adjustable height x1, shower chair x1, transfer bench x1.  Diagnosis congestive heart failure, degenerative disc disease of the lumbar spine. 11/23/18   Charlott Rakes, MD  mupirocin ointment (BACTROBAN) 2 % Apply 1 application topically 2 (two) times daily. 11/03/19   Wieters, Elesa Hacker,  PA-C  potassium chloride SA (KLOR-CON M20) 20 MEQ tablet Take 3 tabs (60 meq) by mouth in the morning and 2 tabs (40 meq) by mouth in the evening 06/11/19   Richardson Dopp T, PA-C  sacubitril-valsartan (ENTRESTO) 49-51 MG Take 1 tablet by mouth 2 (two) times daily. 10/04/19   Deboraha Sprang, MD  simvastatin (ZOCOR) 10 MG tablet TAKE 1 TABLET BY MOUTH EVERY DAY IN THE EVENING 09/28/19   Deboraha Sprang, MD  spironolactone (ALDACTONE) 25 MG tablet TAKE 1/2 TABLET BY MOUTH EVERY DAY 12/11/19   Deboraha Sprang, MD  tizanidine (ZANAFLEX) 2 MG capsule Take 1-2 capsules (2-4 mg total) by mouth at bedtime for 10 days. 02/11/20 02/21/20  Jakobi Thetford, Marguerita Beards, PA-C  torsemide (DEMADEX) 20 MG tablet Take 1 tablet (20 mg total) by mouth daily. 12/18/19   Richardson Dopp T, PA-C  triamcinolone cream (KENALOG) 0.1 % Apply 1 application topically 2 (two) times daily. 11/03/19   Wieters, Hallie C, PA-C  DULoxetine (CYMBALTA) 30 MG capsule Take 1 capsule (30 mg total) by mouth daily. 12/07/18 12/10/19  Charlott Rakes, MD  famotidine (PEPCID) 20 MG tablet Take 1 tablet (20 mg total) 2 (two) times daily by mouth. 04/10/17 11/03/19  Albesa Seen, PA-C    Family History Family History  Problem Relation Age of Onset  . Cancer Maternal Grandmother        uterine  . Hypertension Sister   . Healthy Mother   . Other Neg Hx   . Heart disease Neg Hx     Social History Social History   Tobacco Use  . Smoking status: Former Smoker    Packs/day: 0.10    Years: 30.00    Pack years: 3.00    Types: Cigarettes     Quit date: 06/03/2015    Years since quitting: 4.6  . Smokeless tobacco: Never Used  . Tobacco comment: Pt. stated she stopped smoking a year ago. 09/29/2018  Vaping Use  . Vaping Use: Never used  Substance Use Topics  . Alcohol use: No    Alcohol/week: 0.0 standard drinks  . Drug use: No     Allergies   Ace inhibitors   Review of Systems Review of Systems   Physical Exam Triage Vital Signs ED Triage Vitals  Enc Vitals Group     BP 02/11/20 1059 117/80     Pulse Rate 02/11/20 1059 77     Resp 02/11/20 1059 20     Temp 02/11/20 1059 98.4 F (36.9 C)     Temp Source 02/11/20 1059 Oral     SpO2 02/11/20 1059 100 %     Weight --      Height --      Head Circumference --      Peak Flow --      Pain Score 02/11/20 1057 9     Pain Loc --      Pain Edu? --      Excl. in [place]? --    No data found.  Updated Vital Signs BP 117/80 (BP Location: Right Arm)   Pulse 77   Temp 98.4 F (36.9 C) (Oral)   Resp 20   Wt 230 lb 3.2 oz (104.4 kg)   SpO2 100%   BMI 35.00 kg/m   Visual Acuity Right Eye Distance:   Left Eye Distance:   Bilateral Distance:    Right Eye Near:   Left Eye Near:    Bilateral Near:  Physical Exam Vitals and nursing note reviewed.  Constitutional:      General: She is not in acute distress.    Appearance: She is well-developed. She is not ill-appearing.  HENT:     Head: Normocephalic and atraumatic.     Right Ear: Tympanic membrane normal.     Left Ear: Tympanic membrane normal.     Nose: Congestion present.     Mouth/Throat:     Mouth: Mucous membranes are moist.     Pharynx: Uvula midline. No posterior oropharyngeal erythema.     Tonsils: No tonsillar exudate. 0 on the right. 0 on the left.  Eyes:     Conjunctiva/sclera: Conjunctivae normal.  Cardiovascular:     Rate and Rhythm: Normal rate and regular rhythm.     Heart sounds: No murmur heard.   Pulmonary:     Effort: Pulmonary effort is normal. No respiratory distress.      Breath sounds: Normal breath sounds. No wheezing, rhonchi or rales.     Comments: Patient speaking in full sentences.  Saturating 100% on room air. Abdominal:     Palpations: Abdomen is soft.     Tenderness: There is no abdominal tenderness.  Musculoskeletal:     Cervical back: Neck supple.     Right lower leg: No edema.     Left lower leg: No edema.     Comments: Right-sided paralumbar spinal tenderness.  Straight leg raise equivocal.  Patient is ambulatory.  There is no midline spinal tenderness in the lumbar, thoracic or cervical spine region.  Lymphadenopathy:     Cervical: No cervical adenopathy.  Skin:    General: Skin is warm and dry.     Findings: No rash.  Neurological:     General: No focal deficit present.     Mental Status: She is alert and oriented to person, place, and time.      UC Treatments / Results  Labs (all labs ordered are listed, but only abnormal results are displayed) Labs Reviewed  SARS CORONAVIRUS 2 (TAT 6-24 HRS)    EKG   Radiology No results found.  Procedures Procedures (including critical care time)  Medications Ordered in UC Medications - No data to display  Initial Impression / Assessment and Plan / UC Course  I have reviewed the triage vital signs and the nursing notes.  Pertinent labs & imaging results that were available during my care of the patient were reviewed by me and considered in my medical decision making (see chart for details).     #Chronic right low back pain #Viral URI #Orthopnea Patient is a 54 year old with history of chronic systolic and diastolic heart failure presenting with chronic right-sided low back pain, for upper respiratory symptoms and orthopnea.  She has normal vital signs here.  Her weight does appear to be up from her baseline, however she has not blatantly fluid overloaded here.  No red flags, will defer any imaging today.  We will Covid test her.  We will treat her upper respiratory symptoms  symptomatically.  We will treat her low back pain with Tylenol and muscle relaxer and topical Voltaren.  We will have her avoid NSAIDs given likely fluid retaining.  We will have her increase her torsemide, by 10 mg for the next 2 to 3 days and have her closely follow-up with her cardiologist.  Discussed this with supervising physician Dr. Lanny Cramp, and we are in agreement on the plan of care.  Discussed return, follow-up and emergency department cautions.  Patient verbalized agreement and understanding plan of care. Final Clinical Impressions(s) / UC Diagnoses   Final diagnoses:  Chronic right-sided low back pain with sciatica, sciatica laterality unspecified  Viral upper respiratory tract infection  Orthopnea     Discharge Instructions     For your low back pain take the muscle relaxer tizanidine/Zanaflex at night, start with 1 capsule, you may increase to 2 capsules if this does not make you too sleepy.  Do not drive, drink alcohol or operate machinery within 8 hours of taking this May take Tylenol as prescribed. Use the topical gel up to 4 times a day Stop ibuprofen, naproxen's for pain.  Use the Cepacol lozenges for sore throat  For the next 2 to 3 days, take an additional 1/2 torsemide tablet, this is in addition to the 1 tablet you take regularly.  Call your cardiologist and primary care today for close follow-up  If you develop worsening symptoms to include shortness of breath, chest pain or other severe symptoms and concerning symptoms go to the emergency department   If your Covid-19 test is positive, you will receive a phone call from [facility] regarding your results. Negative test results are not called. Both positive and negative results area always visible on MyChart. If you do not have a MyChart account, sign up instructions are in your discharge papers.   Persons who are directed to care for themselves at home may discontinue isolation under the following  conditions:  . At least 10 days have passed since symptom onset and . At least 24 hours have passed without running a fever (this means without the use of fever-reducing medications) and . Other symptoms have improved.  Persons infected with COVID-19 who never develop symptoms may discontinue isolation and other precautions 10 days after the date of their first positive COVID-19 test.        ED Prescriptions    Medication Sig Dispense Auth. Provider   tizanidine (ZANAFLEX) 2 MG capsule Take 1-2 capsules (2-4 mg total) by mouth at bedtime for 10 days. 20 capsule Bryn Saline, Marguerita Beards, PA-C   acetaminophen (TYLENOL) 325 MG tablet Take 2 tablets (650 mg total) by mouth every 6 (six) hours as needed for up to 10 days. 30 tablet Norvella Loscalzo, Marguerita Beards, PA-C   Menthol (CEPACOL SORE THROAT) 5.4 MG LOZG Use as directed 1 lozenge (5.4 mg total) in the mouth or throat every 2 (two) hours as needed. 30 lozenge Indi Willhite, Marguerita Beards, PA-C   diclofenac Sodium (VOLTAREN) 1 % GEL Apply 4 g topically 4 (four) times daily. 100 g Kerin Kren, Marguerita Beards, PA-C     I have reviewed the PDMP during this encounter.   Purnell Shoemaker, PA-C 02/11/20 1343

## 2020-02-11 NOTE — Discharge Instructions (Signed)
For your low back pain take the muscle relaxer tizanidine/Zanaflex at night, start with 1 capsule, you may increase to 2 capsules if this does not make you too sleepy.  Do not drive, drink alcohol or operate machinery within 8 hours of taking this May take Tylenol as prescribed. Use the topical gel up to 4 times a day Stop ibuprofen, naproxen's for pain.  Use the Cepacol lozenges for sore throat  For the next 2 to 3 days, take an additional 1/2 torsemide tablet, this is in addition to the 1 tablet you take regularly.  Call your cardiologist and primary care today for close follow-up  If you develop worsening symptoms to include shortness of breath, chest pain or other severe symptoms and concerning symptoms go to the emergency department   If your Covid-19 test is positive, you will receive a phone call from [facility] regarding your results. Negative test results are not called. Both positive and negative results area always visible on MyChart. If you do not have a MyChart account, sign up instructions are in your discharge papers.   Persons who are directed to care for themselves at home may discontinue isolation under the following conditions:   At least 10 days have passed since symptom onset and  At least 24 hours have passed without running a fever (this means without the use of fever-reducing medications) and  Other symptoms have improved.  Persons infected with COVID-19 who never develop symptoms may discontinue isolation and other precautions 10 days after the date of their first positive COVID-19 test.

## 2020-02-12 LAB — SARS CORONAVIRUS 2 (TAT 6-24 HRS): SARS Coronavirus 2: NEGATIVE

## 2020-03-03 ENCOUNTER — Other Ambulatory Visit: Payer: Self-pay | Admitting: Internal Medicine

## 2020-03-03 ENCOUNTER — Other Ambulatory Visit: Payer: Self-pay

## 2020-03-03 ENCOUNTER — Encounter (HOSPITAL_COMMUNITY): Payer: Self-pay

## 2020-03-03 ENCOUNTER — Ambulatory Visit (INDEPENDENT_AMBULATORY_CARE_PROVIDER_SITE_OTHER): Payer: Medicaid Other

## 2020-03-03 ENCOUNTER — Ambulatory Visit (HOSPITAL_COMMUNITY)
Admission: EM | Admit: 2020-03-03 | Discharge: 2020-03-03 | Disposition: A | Discharge status: Home / Self Care | Payer: Medicaid Other | Attending: Family Medicine | Admitting: Family Medicine

## 2020-03-03 DIAGNOSIS — R0789 Other chest pain: Secondary | ICD-10-CM

## 2020-03-03 DIAGNOSIS — J90 Pleural effusion, not elsewhere classified: Secondary | ICD-10-CM | POA: Diagnosis not present

## 2020-03-03 DIAGNOSIS — R058 Other specified cough: Secondary | ICD-10-CM

## 2020-03-03 DIAGNOSIS — R079 Chest pain, unspecified: Secondary | ICD-10-CM | POA: Diagnosis not present

## 2020-03-03 DIAGNOSIS — B349 Viral infection, unspecified: Secondary | ICD-10-CM

## 2020-03-03 DIAGNOSIS — R0602 Shortness of breath: Secondary | ICD-10-CM

## 2020-03-03 DIAGNOSIS — R059 Cough, unspecified: Secondary | ICD-10-CM | POA: Diagnosis not present

## 2020-03-03 MED ORDER — TIZANIDINE HCL 4 MG PO TABS
4.0000 mg | ORAL_TABLET | Freq: Four times a day (QID) | ORAL | 0 refills | Status: DC | PRN
Start: 2020-03-03 — End: 2020-05-06

## 2020-03-03 MED ORDER — HYDROCODONE-HOMATROPINE 5-1.5 MG/5ML PO SYRP: 5.0000 mL | ORAL_SOLUTION | Freq: Four times a day (QID) | ORAL | 0 refills | Status: DC | PRN

## 2020-03-03 MED ORDER — PROMETHAZINE-DM 6.25-15 MG/5ML PO SYRP
5.0000 mL | ORAL_SOLUTION | Freq: Four times a day (QID) | ORAL | 0 refills | Status: DC | PRN
Start: 2020-03-03 — End: 2020-06-07

## 2020-03-03 NOTE — Discharge Instructions (Addendum)
Your x ray and EKG were normal This is most likely viral illness You can take OTC medicines for your symptoms.  Follow up with your cardiologist if the tachycardia continues.

## 2020-03-03 NOTE — ED Triage Notes (Signed)
Pt presents with non productive cough and back pain since Friday with no relief with OTC medication.

## 2020-03-03 NOTE — Telephone Encounter (Signed)
Switching from Hycodan to promethazine cough syrup based on prescription being on back order

## 2020-03-03 NOTE — Telephone Encounter (Signed)
Please call patient and find out how she has been taking her Torsemide. Richardson Dopp, PA-C    03/03/2020 5:08 PM

## 2020-03-03 NOTE — Telephone Encounter (Signed)
*  STAT* If patient is at the pharmacy, call can be transferred to refill team.   1. Which medications need to be refilled? (please list name of each medication and dose if known) need a new prescription, her dose had changed- she need this today please Torsemide  2. Which pharmacy/location (including street and city if local pharmacy) is medication to be sent to? [facility] [place]  3. Do they need a 30 day or 90 day supply? 90 days  and refills

## 2020-03-03 NOTE — Telephone Encounter (Signed)
Pt calling requesting a refill for her Torsemide.  It was fill 12/18/2019 #90 X 2 to take 1 tablet daily. 02/11/20 - pt was instructed in ED to take extra 10 mg X 2-3 days which she did.  She, however must have been taking more for longer because she is out of it and pharmacy will not refill until the end of the month.  Will have Scott to review as to how to proceed.

## 2020-03-03 NOTE — Telephone Encounter (Signed)
*  STAT* If patient is at the pharmacy, call can be transferred to refill team.   1. Which medications need to be refilled? (please list name of each medication and dose if known) torsemide (DEMADEX) 20 MG tablet  2. Which pharmacy/location (including street and city if local pharmacy) is medication to be sent to? [id number] [place] [place] - [address] AT [place]  3. Do they need a 30 day or 90 day supply? [place]

## 2020-03-04 NOTE — ED Provider Notes (Signed)
[place]    CSN: [id number] Arrival date & time: 03/03/20  5956      History   Chief Complaint Chief Complaint  Patient presents with  . Cough  . Back Pain    HPI Chelsea Branch is a 54 y.o. female.   Patient is a 54 year old female who presents today with approximately 3 days of nonproductive cough, generalized back discomfort. Symptoms have been constant. She is taken over-the-counter cough medication without any relief. No fevers, chills, fatigue, night sweats, sore throat.     Past Medical History:  Diagnosis Date  . Abnormal thyroid blood test 03/15/2017  . Abnormal uterine bleeding   . Alopecia   . Anemia   . Angina pectoris with normal coronary arteriogram ([place]) 2017   Had + Troponin c/w ? NSTEMI due to A on C CHF  . 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 Chronic combined systolic and diastolic CHF (congestive heart failure) ([place])   . DYSLIPIDEMIA   . Essential hypertension   . Fibroids   . H/O noncompliance with medical treatment, presenting hazards to health   . Hypertension   . Hypokalemia 07/23/2016  . Menorrhagia   . 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 iniatially ?2/2 peripartum in 1994 - improved by 2008 then worsening EF in 2011 back down to EF 25-30%. b. echo 01/21/14 showed mod LVH, [name]; [name], [name], [name],   . 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 NSTEMI (non-ST elevated myocardial infarction) ([place]) 05/2015   Normal Coronaries.  . Peripartum cardiomyopathy 1994  . Sleep apnea 2015   CPAP 12/2013  . Stroke [facility]) 2011  . Systolic and diastolic CHF, acute on chronic ([place]) 06/14/2015  . Tobacco abuse     Patient Active Problem List   Diagnosis Date Noted  . Degenerative disc disease at L5-S1 level 10/10/2018  . Abnormal thyroid blood test 03/15/2017  . Hypokalemia 07/23/2016  . Low TSH level 04/26/2016  . Abnormal uterine bleeding 04/06/2016  . Nonspecific chest pain     . 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 NSTEMI (non-ST elevated myocardial infarction) ([place]) - with normal cornaries on Cath 06/14/2015  . Systolic and diastolic CHF, acute on chronic ([place]) 06/14/2015  . H/O noncompliance with medical treatment, presenting hazards to health   . Low back pain 09/16/2014  . Homelessness 09/16/2014  . Vitamin D deficiency 01/29/2014  . OSA (obstructive sleep apnea) 01/08/2014  . Chronic combined systolic and diastolic CHF (congestive heart failure) ([place]) 08/14/2013  . Essential hypertension   . Fibroids 08/09/2012  . Menorrhagia 05/08/2012  . Female pattern hair loss   . HLD (hyperlipidemia) 06/08/2010  . CEREBRAL ANEURYSM 06/08/2010  . 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 CEREBROVASCULAR ACCIDENT, HX OF 06/08/2010    Past Surgical History:  Procedure Laterality Date  . CARDIAC CATHETERIZATION N/A 06/16/2015   Procedure: Left Heart Cath and Coronary Angiography;  Surgeon: Jettie Booze, MD;  Location: [facility];  Service: Cardiovascular;  Laterality: N/A;  . [place]  . LOOP RECORDER IMPLANT  ~ 2000  . TIBIAL TUBERCLERPLASTY    . TUBAL LIGATION  1994    OB History    Gravida  2   Para  2   Term  2   Preterm      AB      Living  3     SAB      TAB  Ectopic      Multiple  1   Live Births  3            Home Medications    Prior to Admission medications   Medication Sig Start Date End Date Taking? Authorizing Provider  albuterol (VENTOLIN HFA) 108 (90 Base) MCG/ACT inhaler Inhale 1-2 puffs into the lungs every 6 (six) hours as needed for wheezing or shortness of breath. 11/21/19   Jaynee Eagles, PA-C  aspirin 81 MG EC tablet Take 1 tablet (81 mg total) by mouth daily. 03/31/16   Milagros Loll, MD  cetirizine (ZYRTEC) 10 MG tablet Take 1 tablet (10 mg total) by mouth daily. 09/13/19   Loura Halt A, NP  diclofenac Sodium (VOLTAREN) 1 % GEL Apply 4 g topically 4  (four) times daily. 02/11/20   Darr, Marguerita Beards, PA-C  diphenhydrAMINE (BENADRYL) 25 MG tablet Take 1 tablet (25 mg total) by mouth every 6 (six) hours. 09/13/19   Robley Matassa, Tressia Miners A, NP  magnesium oxide (MAG-OX) 400 MG tablet TAKE 1 CAPSULE (400 MG TOTAL) BY MOUTH 2 (TWO) TIMES DAILY. 01/22/20   Deboraha Sprang, MD  Menthol (CEPACOL SORE THROAT) 5.4 MG LOZG Use as directed 1 lozenge (5.4 mg total) in the mouth or throat every 2 (two) hours as needed. 02/11/20   Darr, Marguerita Beards, PA-C  metoprolol succinate (TOPROL-XL) 50 MG 24 hr tablet TAKE 1 AND A HALF TABLETS BY MOUTH DAILY 01/07/20   Deboraha Sprang, MD  Misc. Devices MISC Please provide patient with insurance approved straight cane 09/29/18   Gildardo Pounds, NP  Misc. Devices MISC Single-point cane with adjustable height x1, shower chair x1, transfer bench x1.  Diagnosis congestive heart failure, degenerative disc disease of the lumbar spine. 11/23/18   Charlott Rakes, MD  mupirocin ointment (BACTROBAN) 2 % Apply 1 application topically 2 (two) times daily. 11/03/19   Wieters, Hallie C, PA-C  potassium chloride SA (KLOR-CON M20) 20 MEQ tablet Take 3 tabs (60 meq) by mouth in the morning and 2 tabs (40 meq) by mouth in the evening 06/11/19   Richardson Dopp T, PA-C  promethazine-dextromethorphan (PROMETHAZINE-DM) 6.25-15 MG/5ML syrup Take 5 mLs by mouth 4 (four) times daily as needed for cough. 03/03/20   Brandy Kabat, Tressia Miners A, NP  sacubitril-valsartan (ENTRESTO) 49-51 MG Take 1 tablet by mouth 2 (two) times daily. 10/04/19   Deboraha Sprang, MD  simvastatin (ZOCOR) 10 MG tablet TAKE 1 TABLET BY MOUTH EVERY DAY IN THE EVENING 09/28/19   Deboraha Sprang, MD  spironolactone (ALDACTONE) 25 MG tablet TAKE 1/2 TABLET BY MOUTH EVERY DAY 12/11/19   Deboraha Sprang, MD  tiZANidine (ZANAFLEX) 4 MG tablet Take 1 tablet (4 mg total) by mouth every 6 (six) hours as needed for muscle spasms. 03/03/20   Loura Halt A, NP  torsemide (DEMADEX) 20 MG tablet Take 1 tablet (20 mg total) by mouth  daily. 12/18/19   Richardson Dopp T, PA-C  triamcinolone cream (KENALOG) 0.1 % Apply 1 application topically 2 (two) times daily. 11/03/19   Wieters, Hallie C, PA-C  DULoxetine (CYMBALTA) 30 MG capsule Take 1 capsule (30 mg total) by mouth daily. 12/07/18 12/10/19  Charlott Rakes, MD  famotidine (PEPCID) 20 MG tablet Take 1 tablet (20 mg total) 2 (two) times daily by mouth. 04/10/17 11/03/19  Albesa Seen, PA-C    Family History Family History  Problem Relation Age of Onset  . Cancer Maternal Grandmother  uterine  . Hypertension Sister   . Healthy Mother   . Other Neg Hx   . Heart disease Neg Hx     Social History Social History   Tobacco Use  . Smoking status: Former Smoker    Packs/day: 0.10    Years: 30.00    Pack years: 3.00    Types: Cigarettes    Quit date: 06/03/2015    Years since quitting: 4.7  . Smokeless tobacco: Never Used  . Tobacco comment: Pt. stated she stopped smoking a year ago. 09/29/2018  Vaping Use  . Vaping Use: Never used  Substance Use Topics  . Alcohol use: No    Alcohol/week: 0.0 standard drinks  . Drug use: No     Allergies   Ace inhibitors   Review of Systems Review of Systems   Physical Exam Triage Vital Signs ED Triage Vitals  Enc Vitals Group     BP 03/03/20 1201 (!) 156/98     Pulse Rate 03/03/20 1201 71     Resp 03/03/20 1201 17     Temp 03/03/20 1201 98.2 F (36.8 C)     Temp Source 03/03/20 1201 Oral     SpO2 03/03/20 1201 97 %     Weight --      Height --      Head Circumference --      Peak Flow --      Pain Score 03/03/20 1200 6     Pain Loc --      Pain Edu? --      Excl. in [place]? --    No data found.  Updated Vital Signs BP (!) 156/98 (BP Location: Right Arm)   Pulse 71   Temp 98.2 F (36.8 C) (Oral)   Resp 17   SpO2 97%   Visual Acuity Right Eye Distance:   Left Eye Distance:   Bilateral Distance:    Right Eye Near:   Left Eye Near:    Bilateral Near:     Physical Exam Vitals and nursing  note reviewed.  Constitutional:      General: She is not in acute distress.    Appearance: Normal appearance. She is not ill-appearing, toxic-appearing or diaphoretic.  HENT:     Head: Normocephalic.     Nose: Nose normal.  Eyes:     Conjunctiva/sclera: Conjunctivae normal.  Pulmonary:     Effort: Pulmonary effort is normal.  Musculoskeletal:        General: Normal range of motion.     Cervical back: Normal range of motion.  Skin:    General: Skin is warm and dry.     Findings: No rash.  Neurological:     Mental Status: She is alert.  Psychiatric:        Mood and Affect: Mood normal.      UC Treatments / Results  Labs (all labs ordered are listed, but only abnormal results are displayed) Labs Reviewed - No data to display  EKG   Radiology DG Chest 2 View  Result Date: 03/03/2020 CLINICAL DATA:  Cough and chest pain.  Shortness of breath. EXAM: CHEST - 2 VIEW COMPARISON:  09/13/2019 FINDINGS: The lungs are clear without focal pneumonia, edema, pneumothorax or pleural effusion. The cardiopericardial silhouette is within normal limits for size. The visualized bony structures of the thorax show no acute abnormality. IMPRESSION: No acute cardiopulmonary findings. Electronically Signed   By: Misty Stanley M.D.   On: 03/03/2020 12:59    Procedures  Procedures (including critical care time)  Medications Ordered in UC Medications - No data to display  Initial Impression / Assessment and Plan / UC Course  I have reviewed the triage vital signs and the nursing notes.  Pertinent labs & imaging results that were available during my care of the patient were reviewed by me and considered in my medical decision making (see chart for details).     Viral illness Chest x-ray and EKG here today were normal. EKG with normal sinus rhythm and normal rate. Lungs clear on exam and vital signs stable. Most likely some sort of viral illness.  Cough syrup as needed and Zanaflex for muscle  relaxant Recommend follow-up with cardiologist for continuous tachycardia. Patient already has loop recorder.  Final Clinical Impressions(s) / UC Diagnoses   Final diagnoses:  Viral illness     Discharge Instructions     Your x ray and EKG were normal This is most likely viral illness You can take OTC medicines for your symptoms.  Follow up with your cardiologist if the tachycardia continues.     ED Prescriptions    Medication Sig Dispense Auth. Provider   HYDROcodone-homatropine (HYCODAN) 5-1.5 MG/5ML syrup Take 5 mLs by mouth every 6 (six) hours as needed for cough. 120 mL Yong Grieser A, NP   tiZANidine (ZANAFLEX) 4 MG tablet Take 1 tablet (4 mg total) by mouth every 6 (six) hours as needed for muscle spasms. 30 tablet Loura Halt A, NP     PDMP not reviewed this encounter.   Orvan July, NP 03/04/20 1138

## 2020-03-04 NOTE — Telephone Encounter (Signed)
I called and spoke with patient, she states that she did take an extra half tablet for 3 days as instructed, but she is now back to take only 1 tablet by mouth once a day. Patient states that she has not been taking extra.

## 2020-03-04 NOTE — Telephone Encounter (Signed)
Pt's medication torsemide, is being addressed in another phone note with Richardson Dopp, PA.

## 2020-03-05 MED ORDER — TORSEMIDE 20 MG PO TABS: 20.0000 mg | ORAL_TABLET | Freq: Every day | ORAL | 3 refills | Status: DC

## 2020-03-05 NOTE — Telephone Encounter (Signed)
Ok to refill. Richardson Dopp, PA-C    03/05/2020 2:01 PM

## 2020-03-05 NOTE — Telephone Encounter (Signed)
RX sent to pharmacy  

## 2020-03-05 NOTE — Telephone Encounter (Signed)
Ok to refill as she is taking it. Richardson Dopp, PA-C    03/05/2020 2:00 PM

## 2020-03-24 ENCOUNTER — Ambulatory Visit (HOSPITAL_COMMUNITY)
Admission: EM | Admit: 2020-03-24 | Discharge: 2020-03-24 | Disposition: A | Discharge status: Home / Self Care | Payer: Medicaid Other | Attending: Urgent Care | Admitting: Urgent Care

## 2020-03-24 ENCOUNTER — Other Ambulatory Visit: Payer: Self-pay

## 2020-03-24 ENCOUNTER — Encounter (HOSPITAL_COMMUNITY): Payer: Self-pay

## 2020-03-24 DIAGNOSIS — G8929 Other chronic pain: Secondary | ICD-10-CM

## 2020-03-24 DIAGNOSIS — M5136 Other intervertebral disc degeneration, lumbar region: Secondary | ICD-10-CM

## 2020-03-24 DIAGNOSIS — I5042 Chronic combined systolic (congestive) and diastolic (congestive) heart failure: Secondary | ICD-10-CM

## 2020-03-24 DIAGNOSIS — M5441 Lumbago with sciatica, right side: Secondary | ICD-10-CM

## 2020-03-24 DIAGNOSIS — R739 Hyperglycemia, unspecified: Secondary | ICD-10-CM

## 2020-03-24 DIAGNOSIS — M545 Low back pain, unspecified: Secondary | ICD-10-CM | POA: Diagnosis not present

## 2020-03-24 LAB — CBG MONITORING, ED: Glucose-Capillary: 174 mg/dL — ABNORMAL HIGH (ref 70–99)

## 2020-03-24 MED ORDER — HYDROCODONE-ACETAMINOPHEN 5-325 MG PO TABS: 1.0000 | ORAL_TABLET | Freq: Four times a day (QID) | ORAL | 0 refills | Status: DC | PRN

## 2020-03-24 NOTE — Discharge Instructions (Addendum)
Please make sure you follow-up with your regular doctor as you continue to have elevated blood sugars.  He will need laboratory testing to confirm that you do or do not have diabetes.  In light of this and also your heart failure, history of heart attack I cannot use prednisone to help you with your severe back pain.  Please make sure you follow-up with your back specialist as you may need another back injection.  In the meantime, please schedule Tylenol at 500 mg - 650 mg once every 6 hours as needed for aches and pains.  If you still have pain despite taking Tylenol regularly, this is breakthrough pain.  You can use hydrocodone once every 6 hours for this.  Once your pain is better controlled, switch back to just Tylenol.  Make sure you hydrate well with plain water every day.  You can continue to use the muscle relaxant.

## 2020-03-24 NOTE — ED Provider Notes (Signed)
[place]   MRN: [id number] DOB: 07-28-1965  Subjective:   Chelsea Branch is a 54 y.o. female presenting for 3-day history acute onset recurrent low back pain worse over the left lower side.  States that it starts at the top of her hip/buttock and radiates down into her left thigh.  Imaging confirms that patient has a history of degenerative disc disease worse over the lower lumbar region.  She does have a back specialist and has previously had to get back injections but has not been back to them.  She has a history of combined systolic and diastolic CHF, history of heart disease, nonischemic dilated cardiomyopathy.  Patient insists that she does not have diabetes but she has had persistently elevated blood sugars.  No current facility-administered medications for this encounter.  Current Outpatient Medications:  .  aspirin 81 MG EC tablet, Take 1 tablet (81 mg total) by mouth daily., Disp: 30 tablet, Rfl: 0 .  metoprolol succinate (TOPROL-XL) 50 MG 24 hr tablet, TAKE 1 AND A HALF TABLETS BY MOUTH DAILY, Disp: 135 tablet, Rfl: 2 .  potassium chloride SA (KLOR-CON M20) 20 MEQ tablet, Take 3 tabs (60 meq) by mouth in the morning and 2 tabs (40 meq) by mouth in the evening, Disp: 150 tablet, Rfl: 3 .  sacubitril-valsartan (ENTRESTO) 49-51 MG, Take 1 tablet by mouth 2 (two) times daily., Disp: 180 tablet, Rfl: 1 .  simvastatin (ZOCOR) 10 MG tablet, TAKE 1 TABLET BY MOUTH EVERY DAY IN THE EVENING, Disp: 30 tablet, Rfl: 4 .  spironolactone (ALDACTONE) 25 MG tablet, TAKE 1/2 TABLET BY MOUTH EVERY DAY, Disp: 90 tablet, Rfl: 1 .  tiZANidine (ZANAFLEX) 4 MG tablet, Take 1 tablet (4 mg total) by mouth every 6 (six) hours as needed for muscle spasms., Disp: 30 tablet, Rfl: 0 .  torsemide (DEMADEX) 20 MG tablet, Take 1 tablet (20 mg total) by mouth daily., Disp: 90 tablet, Rfl: 3 .  albuterol (VENTOLIN HFA) 108 (90 Base) MCG/ACT inhaler, Inhale 1-2 puffs into the lungs every 6 (six) hours  as needed for wheezing or shortness of breath., Disp: 18 g, Rfl: 0 .  cetirizine (ZYRTEC) 10 MG tablet, Take 1 tablet (10 mg total) by mouth daily., Disp: 30 tablet, Rfl: 0 .  diclofenac Sodium (VOLTAREN) 1 % GEL, Apply 4 g topically 4 (four) times daily., Disp: 100 g, Rfl: 0 .  diphenhydrAMINE (BENADRYL) 25 MG tablet, Take 1 tablet (25 mg total) by mouth every 6 (six) hours., Disp: 20 tablet, Rfl: 0 .  magnesium oxide (MAG-OX) 400 MG tablet, TAKE 1 CAPSULE (400 MG TOTAL) BY MOUTH 2 (TWO) TIMES DAILY., Disp: 180 tablet, Rfl: 2 .  Menthol (CEPACOL SORE THROAT) 5.4 MG LOZG, Use as directed 1 lozenge (5.4 mg total) in the mouth or throat every 2 (two) hours as needed., Disp: 30 lozenge, Rfl: 0 .  Misc. Devices MISC, Please provide patient with insurance approved straight cane, Disp: 1 each, Rfl: 0 .  Misc. Devices MISC, Single-point cane with adjustable height x1, shower chair x1, transfer bench x1.  Diagnosis congestive heart failure, degenerative disc disease of the lumbar spine., Disp: 1 each, Rfl: 0 .  mupirocin ointment (BACTROBAN) 2 %, Apply 1 application topically 2 (two) times daily., Disp: 30 g, Rfl: 0 .  promethazine-dextromethorphan (PROMETHAZINE-DM) 6.25-15 MG/5ML syrup, Take 5 mLs by mouth 4 (four) times daily as needed for cough., Disp: 118 mL, Rfl: 0 .  triamcinolone cream (KENALOG) 0.1 %, Apply 1  application topically 2 (two) times daily., Disp: 45 g, Rfl: 0   Allergies  Allergen Reactions  . Ace Inhibitors Other (See Comments)    REACTION: Cough    Past Medical History:  Diagnosis Date  . Abnormal thyroid blood test 03/15/2017  . Abnormal uterine bleeding   . Alopecia   . Anemia   . Angina pectoris with normal coronary arteriogram ([place]) 2017   Had + Troponin c/w ? NSTEMI due to A on C CHF  . 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 Chronic combined systolic and diastolic CHF (congestive heart failure) ([place])   . DYSLIPIDEMIA   . Essential hypertension   . Fibroids   . H/O  noncompliance with medical treatment, presenting hazards to health   . Hypertension   . Hypokalemia 07/23/2016  . Menorrhagia   . 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 iniatially ?2/2 peripartum in 1994 - improved by 2008 then worsening EF in 2011 back down to EF 25-30%. b. echo 01/21/14 showed mod LVH, [name]; [name], [name], [name],   . 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 NSTEMI (non-ST elevated myocardial infarction) ([place]) 05/2015   Normal Coronaries.  . Peripartum cardiomyopathy 1994  . Sleep apnea 2015   CPAP 12/2013  . Stroke [facility]) 2011  . Systolic and diastolic CHF, acute on chronic ([place]) 06/14/2015  . Tobacco abuse      Past Surgical History:  Procedure Laterality Date  . CARDIAC CATHETERIZATION N/A 06/16/2015   Procedure: Left Heart Cath and Coronary Angiography;  Surgeon: Jettie Booze, MD;  Location: [facility];  Service: Cardiovascular;  Laterality: N/A;  . [place]  . LOOP RECORDER IMPLANT  ~ 2000  . TIBIAL TUBERCLERPLASTY    . TUBAL LIGATION  1994    Family History  Problem Relation Age of Onset  . Cancer Maternal Grandmother        uterine  . Hypertension Sister   . Healthy Mother   . Other Neg Hx   . Heart disease Neg Hx     Social History   Tobacco Use  . Smoking status: Former Smoker    Packs/day: 0.10    Years: 30.00    Pack years: 3.00    Types: Cigarettes    Quit date: 06/03/2015    Years since quitting: 4.8  . Smokeless tobacco: Never Used  . Tobacco comment: Pt. stated she stopped smoking a year ago. 09/29/2018  Vaping Use  . Vaping Use: Never used  Substance Use Topics  . Alcohol use: No    Alcohol/week: 0.0 standard drinks  . Drug use: No    ROS   Objective:   Vitals: BP 126/89 (BP Location: Right Arm)   Pulse 75   Temp 98.8 F (37.1 C) (Oral)   Resp 18   SpO2 98%   Physical Exam Constitutional:      General: She is not in acute  distress.    Appearance: Normal appearance. She is well-developed. She is not ill-appearing, toxic-appearing or diaphoretic.  HENT:     Head: Normocephalic and atraumatic.     Nose: Nose normal.     Mouth/Throat:     Mouth: Mucous membranes are moist.     Pharynx: Oropharynx is clear.  Eyes:     General: No scleral icterus.       Right eye: No discharge.        Left eye: No discharge.  Extraocular Movements: Extraocular movements intact.     Conjunctiva/sclera: Conjunctivae normal.     Pupils: Pupils are equal, round, and reactive to light.  Cardiovascular:     Rate and Rhythm: Normal rate.  Pulmonary:     Effort: Pulmonary effort is normal.  Skin:    General: Skin is warm and dry.  Neurological:     General: No focal deficit present.     Mental Status: She is alert and oriented to person, place, and time.     Motor: No weakness.     Coordination: Coordination normal.     Gait: Gait normal.     Deep Tendon Reflexes: Reflexes normal.  Psychiatric:        Mood and Affect: Mood normal.        Behavior: Behavior normal.        Thought Content: Thought content normal.        Judgment: Judgment normal.     Results for orders placed or performed during the hospital encounter of 03/24/20 (from the past 24 hour(s))  POC CBG monitoring     Status: Abnormal   Collection Time: 03/24/20 12:25 PM  Result Value Ref Range   Glucose-Capillary 174 (H) 70 - 99 mg/dL    Assessment and Plan :   PDMP not reviewed this encounter.  1. Chronic low back pain with right-sided sciatica, unspecified back pain laterality   2. Degenerative disc disease, lumbar   3. Chronic combined systolic and diastolic congestive heart failure ([place])   4. Hyperglycemia    Patient insists that she does not have diabetes despite my review of her persistently elevated blood sugars and today's reading of 174.  Emphasized need to follow-up with her PCP to establish her diabetes diagnosis.  As result of her  chronic heart conditions, elevated blood sugar will avoid any steroid use, NSAIDs.  Emphasized need to follow-up with her back surgeon/specialist.  Schedule Tylenol, use hydrocodone for breakthrough pain. Counseled patient on potential for adverse effects with medications prescribed/recommended today, ER and return-to-clinic precautions discussed, patient verbalized understanding.    Jaynee Eagles, PA-C 03/24/20 1339

## 2020-03-24 NOTE — ED Triage Notes (Signed)
Pt c/o lower back pain radiating through right buttocks and right leg onset yesterday. Pt states pain increases with standing up from seated position.  Denies numbness to legs/feet.  Took 1000mg  tylenol at approx 0630 this morning.  Took zanaflex yesterday with only mild improvement of pain.

## 2020-03-28 DIAGNOSIS — M5416 Radiculopathy, lumbar region: Secondary | ICD-10-CM | POA: Diagnosis not present

## 2020-03-28 DIAGNOSIS — M48061 Spinal stenosis, lumbar region without neurogenic claudication: Secondary | ICD-10-CM | POA: Diagnosis not present

## 2020-04-08 DIAGNOSIS — M5416 Radiculopathy, lumbar region: Secondary | ICD-10-CM | POA: Diagnosis not present

## 2020-04-08 DIAGNOSIS — M48061 Spinal stenosis, lumbar region without neurogenic claudication: Secondary | ICD-10-CM | POA: Diagnosis not present

## 2020-04-12 ENCOUNTER — Other Ambulatory Visit: Payer: Self-pay | Admitting: Internal Medicine

## 2020-04-12 DIAGNOSIS — I5022 Chronic systolic (congestive) heart failure: Secondary | ICD-10-CM

## 2020-04-25 ENCOUNTER — Other Ambulatory Visit: Payer: Self-pay | Admitting: Physician Assistant

## 2020-04-29 DIAGNOSIS — M5416 Radiculopathy, lumbar region: Secondary | ICD-10-CM | POA: Diagnosis not present

## 2020-04-29 DIAGNOSIS — M48061 Spinal stenosis, lumbar region without neurogenic claudication: Secondary | ICD-10-CM | POA: Diagnosis not present

## 2020-05-02 ENCOUNTER — Encounter: Payer: Self-pay | Admitting: Internal Medicine

## 2020-05-02 ENCOUNTER — Ambulatory Visit (INDEPENDENT_AMBULATORY_CARE_PROVIDER_SITE_OTHER): Payer: Medicaid Other | Admitting: Internal Medicine

## 2020-05-02 ENCOUNTER — Other Ambulatory Visit: Payer: Self-pay

## 2020-05-02 VITALS — BP 92/48 | HR 76 | Ht 68.0 in | Wt 227.0 lb

## 2020-05-02 DIAGNOSIS — I5042 Chronic combined systolic (congestive) and diastolic (congestive) heart failure: Secondary | ICD-10-CM

## 2020-05-02 DIAGNOSIS — I420 Dilated cardiomyopathy: Secondary | ICD-10-CM | POA: Diagnosis not present

## 2020-05-02 MED ORDER — ENTRESTO 49-51 MG PO TABS: 0.5000 | ORAL_TABLET | Freq: Two times a day (BID) | ORAL | 1 refills | Status: DC

## 2020-05-02 MED ORDER — METOPROLOL SUCCINATE ER 50 MG PO TB24: 50.0000 mg | ORAL_TABLET | Freq: Every day | ORAL | 3 refills | Status: DC

## 2020-05-02 MED ORDER — SPIRONOLACTONE 25 MG PO TABS: 12.5000 mg | ORAL_TABLET | Freq: Every day | ORAL | 1 refills | Status: DC

## 2020-05-02 NOTE — Progress Notes (Signed)
Patient Care Team: Charlott Rakes, MD as PCP - General (Family Medicine) Deboraha Sprang, MD as PCP - Cardiology (Cardiology)   HPI  Chelsea Branch is a 54 y.o. 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 She had recovery of LV function most recently in 2008.   She has significant hypertension.   She was rehospitalized 3/15 for acute on chronic congestive heart failure. She was treated with IV diuresis. Echocardiogram demonstrated EF 25-30%.   . Echocardiogram 8/15 demonstrated normalization of LV systolic function  Some lightheadedness with standing  Has had worsening dyspnea but without edema or PND weight is up about 30-40 lbs over the last few years   DATE TEST EF   2008 Echo  Normal    1/17 Cath Normal CA   1/17 Echo  25-30 %   1/18 Echo  30-35%   2/20  Echo    60-65%    Date Cr K Mg  10/18 0.83 3.5    5/21 1.03 3.7 1.4         Past Medical History:  Diagnosis Date  . Abnormal thyroid blood test 03/15/2017  . Abnormal uterine bleeding   . Alopecia   . Anemia   . Angina pectoris with normal coronary arteriogram ([place]) 2017   Had + Troponin c/w ? NSTEMI due to A on C CHF  . 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 Chronic combined systolic and diastolic CHF (congestive heart failure) ([place])   . DYSLIPIDEMIA   . Essential hypertension   . Fibroids   . H/O noncompliance with medical treatment, presenting hazards to health   . Hypertension   . Hypokalemia 07/23/2016  . Menorrhagia   . Nonischemic cardiomyopathy ([place]) 1994; 2017   a. iniatially ?2/2 peripartum in 1994 - improved by 2008 then worsening EF in 2011 back down to EF 25-30%. b. echo 01/21/14 showed mod LVH, [name]; [name], [name], [name],   . Nonischemic dilated cardiomyopathy ([place]) 06/17/2015  . NSTEMI (non-ST elevated myocardial infarction) ([place]) 05/2015   Normal Coronaries.  . Peripartum cardiomyopathy 1994  . Sleep apnea 2015    CPAP 12/2013  . Stroke [facility]) 2011  . Systolic and diastolic CHF, acute on chronic ([place]) 06/14/2015  . Tobacco abuse     Past Surgical History:  Procedure Laterality Date  . CARDIAC CATHETERIZATION N/A 06/16/2015   Procedure: Left Heart Cath and Coronary Angiography;  Surgeon: Jettie Booze, MD;  Location: [facility] [facility];  Service: Cardiovascular;  Laterality: N/A;  . [place]  . LOOP RECORDER IMPLANT  ~ 2000  . TIBIAL TUBERCLERPLASTY    . TUBAL LIGATION  1994    Current Outpatient Medications  Medication Sig Dispense Refill  . albuterol (VENTOLIN HFA) 108 (90 Base) MCG/ACT inhaler Inhale 1-2 puffs into the lungs every 6 (six) hours as needed for wheezing or shortness of breath. 18 g 0  . aspirin 81 MG EC tablet Take 1 tablet (81 mg total) by mouth daily. 30 tablet 0  . cetirizine (ZYRTEC) 10 MG tablet Take 1 tablet (10 mg total) by mouth daily. 30 tablet 0  . diclofenac Sodium (VOLTAREN) 1 % GEL Apply 4 g topically 4 (four) times daily. 100 g 0  . diphenhydrAMINE (BENADRYL) 25 MG tablet Take 1 tablet (25 mg total) by mouth every 6 (six) hours. 20 tablet 0  . HYDROcodone-acetaminophen (NORCO/VICODIN) 5-325 MG tablet Take 1  tablet by mouth every 6 (six) hours as needed for severe pain. 10 tablet 0  . magnesium oxide (MAG-OX) 400 MG tablet TAKE 1 CAPSULE (400 MG TOTAL) BY MOUTH 2 (TWO) TIMES DAILY. 180 tablet 2  . Menthol (CEPACOL SORE THROAT) 5.4 MG LOZG Use as directed 1 lozenge (5.4 mg total) in the mouth or throat every 2 (two) hours as needed. 30 lozenge 0  . metoprolol succinate (TOPROL-XL) 50 MG 24 hr tablet TAKE 1 AND A HALF TABLETS BY MOUTH DAILY 135 tablet 2  . Misc. Devices MISC Please provide patient with insurance approved straight cane 1 each 0  . Misc. Devices MISC Single-point cane with adjustable height x1, shower chair x1, transfer bench x1.  Diagnosis congestive heart failure, degenerative disc disease of the lumbar spine. 1 each 0  .  mupirocin ointment (BACTROBAN) 2 % Apply 1 application topically 2 (two) times daily. 30 g 0  . potassium chloride SA (KLOR-CON M20) 20 MEQ tablet TAKE 1-2 TABLETS (20-40 MEQ TOTAL) BY MOUTH AS DIRECTED. TAKE 2 TABLETS IN THE MORNING (40 MEQ) AND 1 TABLET (20 MEQ) IN THE EVENING 150 tablet 0  . promethazine-dextromethorphan (PROMETHAZINE-DM) 6.25-15 MG/5ML syrup Take 5 mLs by mouth 4 (four) times daily as needed for cough. 118 mL 0  . sacubitril-valsartan (ENTRESTO) 49-51 MG Take 1 tablet by mouth 2 (two) times daily. 180 tablet 1  . simvastatin (ZOCOR) 10 MG tablet TAKE 1 TABLET BY MOUTH EVERY DAY IN THE EVENING 90 tablet 1  . spironolactone (ALDACTONE) 25 MG tablet TAKE 1/2 TABLET BY MOUTH EVERY DAY 90 tablet 1  . tiZANidine (ZANAFLEX) 4 MG tablet Take 1 tablet (4 mg total) by mouth every 6 (six) hours as needed for muscle spasms. 30 tablet 0  . torsemide (DEMADEX) 20 MG tablet Take 1 tablet (20 mg total) by mouth daily. 90 tablet 3  . traMADol (ULTRAM) 50 MG tablet Take 50 mg by mouth 3 (three) times daily as needed.    . triamcinolone cream (KENALOG) 0.1 % Apply 1 application topically 2 (two) times daily. 45 g 0   No current facility-administered medications for this visit.    Allergies  Allergen Reactions  . Ace Inhibitors Other (See Comments)    REACTION: Cough    Review of Systems negative except from HPI and PMH  Physical Exam BP (!) 92/48   Pulse 76   Ht 5\' 8"  (1.727 m)   Wt 227 lb (103 kg)   BMI 34.52 kg/m  Well developed and nourished in no acute distress HENT normal Neck supple with JVP-  flat   Clear Regular rate and rhythm, no murmurs or gallops Abd-soft with active BS No Clubbing cyanosis edema Skin-warm and dry A & Oriented  Grossly normal sensory and motor function  ECG sinus @ 76 19/09/42  Assessment and  Plan Lightheadedness   Nonischemic cardiomyopathy resolved  Congestive heart failure chronic  Hypertension-resolved  Obstructive sleep apnea AHI  18      with lightheadedness and BP will reduce metoprolol 75--25 Entresto 49/51--24/26  We will repeat her LV EF assessment next year.  Euvolemic.  Dyspnea I think is now largely related to significant interval weight gain.  TSH was normal 18 months ago.  Encouraged her with weight loss and discussed a low carbohydrate diet

## 2020-05-02 NOTE — Patient Instructions (Signed)
Medication Instructions:  Your physician has recommended you make the following change in your medication:   Decrease Metoprolol to 50mg  - 1 tablet by mouth daily  Begin taking 1/2 tablet of Entresto by mouth twice daily  *If you need a refill on your cardiac medications before your next appointment, please call your pharmacy*   Lab Work: None ordered.  If you have labs (blood work) drawn today and your tests are completely normal, you will receive your results only by: Marland Kitchen MyChart Message (if you have MyChart) OR . A paper copy in the mail If you have any lab test that is abnormal or we need to change your treatment, we will call you to review the results.   Testing/Procedures: None ordered.    Follow-Up: At [facility], you and your health needs are our priority.  As part of our continuing mission to provide you with exceptional heart care, we have created designated Provider Care Teams.  These Care Teams include your primary Cardiologist (physician) and Advanced Practice Providers (APPs -  Physician Assistants and Nurse Practitioners) who all work together to provide you with the care you need, when you need it.  We recommend signing up for the patient portal called "MyChart".  Sign up information is provided on this After Visit Summary.  MyChart is used to connect with patients for Virtual Visits (Telemedicine).  Patients are able to view lab/test results, encounter notes, upcoming appointments, etc.  Non-urgent messages can be sent to your provider as well.   To learn more about what you can do with MyChart, go to [email or web address].    Your next appointment:   12 month(s)  The format for your next appointment:   In Person  Provider:   Virl Axe, MD

## 2020-05-06 ENCOUNTER — Ambulatory Visit (HOSPITAL_COMMUNITY)
Admission: EM | Admit: 2020-05-06 | Discharge: 2020-05-06 | Disposition: A | Discharge status: Home / Self Care | Payer: Medicaid Other | Attending: Urgent Care | Admitting: Urgent Care

## 2020-05-06 ENCOUNTER — Encounter (HOSPITAL_COMMUNITY): Payer: Self-pay | Admitting: Urgent Care

## 2020-05-06 DIAGNOSIS — M79604 Pain in right leg: Secondary | ICD-10-CM

## 2020-05-06 DIAGNOSIS — M5431 Sciatica, right side: Secondary | ICD-10-CM | POA: Diagnosis not present

## 2020-05-06 DIAGNOSIS — M5136 Other intervertebral disc degeneration, lumbar region: Secondary | ICD-10-CM

## 2020-05-06 MED ORDER — TIZANIDINE HCL 6 MG PO CAPS: 6.0000 mg | ORAL_CAPSULE | Freq: Three times a day (TID) | ORAL | 0 refills | Status: DC | PRN

## 2020-05-06 NOTE — ED Triage Notes (Signed)
Pt presents with right leg pain X 3 weeks. Pt states when she stands it makes the pain worse. Pt states she also has had lower back pain. Pt states sitting down does not make the pain better.

## 2020-05-06 NOTE — Discharge Instructions (Addendum)
Please just use Tylenol at a dose of 500mg -650mg  once every 6 hours as needed for your aches, pains, fevers. Use your strong codeine medicine as prescribed by your back doctor and contact them for a refill. Do not use any nonsteroidal anti-inflammatories (NSAIDs) like ibuprofen, Motrin, naproxen, Aleve, etc. which are all available over-the-counter.  You can use tizanidine as a muscle relaxant.

## 2020-05-06 NOTE — ED Provider Notes (Signed)
[place]   MRN: [id number] DOB: 11-14-1965  Subjective:   Chelsea Branch is a 54 y.o. female presenting for 3-week history of persistent right sided buttock pain that radiates into her right leg, numbness and tingling of her right leg.  Patient has already undergone a cortisone shot with her neuro spine specialist.  She states that she was told she had sciatica.  Denies any recent falls, trauma, car accident.  She does have a prescription for Tylenol with codeine but has not used it.  Her next appointment with her spine specialist is in 1 month.  She does have a history of CAD, CHF.  Last EF was 25-30% in 07/2019.   No current facility-administered medications for this encounter.  Current Outpatient Medications:  .  albuterol (VENTOLIN HFA) 108 (90 Base) MCG/ACT inhaler, Inhale 1-2 puffs into the lungs every 6 (six) hours as needed for wheezing or shortness of breath., Disp: 18 g, Rfl: 0 .  aspirin 81 MG EC tablet, Take 1 tablet (81 mg total) by mouth daily., Disp: 30 tablet, Rfl: 0 .  cetirizine (ZYRTEC) 10 MG tablet, Take 1 tablet (10 mg total) by mouth daily., Disp: 30 tablet, Rfl: 0 .  diclofenac Sodium (VOLTAREN) 1 % GEL, Apply 4 g topically 4 (four) times daily., Disp: 100 g, Rfl: 0 .  diphenhydrAMINE (BENADRYL) 25 MG tablet, Take 1 tablet (25 mg total) by mouth every 6 (six) hours., Disp: 20 tablet, Rfl: 0 .  HYDROcodone-acetaminophen (NORCO/VICODIN) 5-325 MG tablet, Take 1 tablet by mouth every 6 (six) hours as needed for severe pain., Disp: 10 tablet, Rfl: 0 .  magnesium oxide (MAG-OX) 400 MG tablet, TAKE 1 CAPSULE (400 MG TOTAL) BY MOUTH 2 (TWO) TIMES DAILY., Disp: 180 tablet, Rfl: 2 .  Menthol (CEPACOL SORE THROAT) 5.4 MG LOZG, Use as directed 1 lozenge (5.4 mg total) in the mouth or throat every 2 (two) hours as needed., Disp: 30 lozenge, Rfl: 0 .  metoprolol succinate (TOPROL-XL) 50 MG 24 hr tablet, Take 1 tablet (50 mg total) by mouth daily., Disp: 90 tablet,  Rfl: 3 .  Misc. Devices MISC, Please provide patient with insurance approved straight cane, Disp: 1 each, Rfl: 0 .  Misc. Devices MISC, Single-point cane with adjustable height x1, shower chair x1, transfer bench x1.  Diagnosis congestive heart failure, degenerative disc disease of the lumbar spine., Disp: 1 each, Rfl: 0 .  mupirocin ointment (BACTROBAN) 2 %, Apply 1 application topically 2 (two) times daily., Disp: 30 g, Rfl: 0 .  potassium chloride SA (KLOR-CON M20) 20 MEQ tablet, TAKE 1-2 TABLETS (20-40 MEQ TOTAL) BY MOUTH AS DIRECTED. TAKE 2 TABLETS IN THE MORNING (40 MEQ) AND 1 TABLET (20 MEQ) IN THE EVENING, Disp: 150 tablet, Rfl: 0 .  promethazine-dextromethorphan (PROMETHAZINE-DM) 6.25-15 MG/5ML syrup, Take 5 mLs by mouth 4 (four) times daily as needed for cough., Disp: 118 mL, Rfl: 0 .  sacubitril-valsartan (ENTRESTO) 49-51 MG, Take 0.5 tablets by mouth 2 (two) times daily., Disp: 180 tablet, Rfl: 1 .  simvastatin (ZOCOR) 10 MG tablet, TAKE 1 TABLET BY MOUTH EVERY DAY IN THE EVENING, Disp: 90 tablet, Rfl: 1 .  spironolactone (ALDACTONE) 25 MG tablet, Take 0.5 tablets (12.5 mg total) by mouth daily., Disp: 90 tablet, Rfl: 1 .  tiZANidine (ZANAFLEX) 4 MG tablet, Take 1 tablet (4 mg total) by mouth every 6 (six) hours as needed for muscle spasms., Disp: 30 tablet, Rfl: 0 .  torsemide (DEMADEX) 20 MG tablet,  Take 1 tablet (20 mg total) by mouth daily., Disp: 90 tablet, Rfl: 3 .  traMADol (ULTRAM) 50 MG tablet, Take 50 mg by mouth 3 (three) times daily as needed., Disp: , Rfl:  .  triamcinolone cream (KENALOG) 0.1 %, Apply 1 application topically 2 (two) times daily., Disp: 45 g, Rfl: 0   Allergies  Allergen Reactions  . Ace Inhibitors Other (See Comments)    REACTION: Cough    Past Medical History:  Diagnosis Date  . Abnormal thyroid blood test 03/15/2017  . Abnormal uterine bleeding   . Alopecia   . Anemia   . Angina pectoris with normal coronary arteriogram ([place]) 2017   Had +  Troponin c/w ? NSTEMI due to A on C CHF  . 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 Chronic combined systolic and diastolic CHF (congestive heart failure) ([place])   . DYSLIPIDEMIA   . Essential hypertension   . Fibroids   . H/O noncompliance with medical treatment, presenting hazards to health   . Hypertension   . Hypokalemia 07/23/2016  . Menorrhagia   . Nonischemic cardiomyopathy ([place]) 1994; 2017   a. iniatially ?2/2 peripartum in 1994 - improved by 2008 then worsening EF in 2011 back down to EF 25-30%. b. echo 01/21/14 showed mod LVH, [name]; [name], [name], [name],   . Nonischemic dilated cardiomyopathy ([place]) 06/17/2015  . NSTEMI (non-ST elevated myocardial infarction) ([place]) 05/2015   Normal Coronaries.  . Peripartum cardiomyopathy 1994  . Sleep apnea 2015   CPAP 12/2013  . Stroke [facility]) 2011  . Systolic and diastolic CHF, acute on chronic ([place]) 06/14/2015  . Tobacco abuse      Past Surgical History:  Procedure Laterality Date  . CARDIAC CATHETERIZATION N/A 06/16/2015   Procedure: Left Heart Cath and Coronary Angiography;  Surgeon: Jettie Booze, MD;  Location: [facility];  Service: Cardiovascular;  Laterality: N/A;  . [place]  . LOOP RECORDER IMPLANT  ~ 2000  . TIBIAL TUBERCLERPLASTY    . TUBAL LIGATION  1994    Family History  Problem Relation Age of Onset  . Cancer Maternal Grandmother        uterine  . Hypertension Sister   . Healthy Mother   . Other Neg Hx   . Heart disease Neg Hx     Social History   Tobacco Use  . Smoking status: Former Smoker    Packs/day: 0.10    Years: 30.00    Pack years: 3.00    Types: Cigarettes    Quit date: 06/03/2015    Years since quitting: 4.9  . Smokeless tobacco: Never Used  . Tobacco comment: Pt. stated she stopped smoking a year ago. 09/29/2018  Vaping Use  . Vaping Use: Never used  Substance Use Topics  . Alcohol use: No    Alcohol/week: 0.0 standard drinks  .  Drug use: No    ROS   Objective:   Vitals: BP 97/69 (BP Location: Right Arm)   Pulse 75   Temp 97.9 F (36.6 C) (Oral)   Resp 16   SpO2 99%   Physical Exam Constitutional:      General: She is not in acute distress.    Appearance: Normal appearance. She is well-developed. She is not ill-appearing, toxic-appearing or diaphoretic.  HENT:     Head: Normocephalic and atraumatic.     Nose: Nose normal.     Mouth/Throat:  Mouth: Mucous membranes are moist.     Pharynx: Oropharynx is clear.  Eyes:     General: No scleral icterus.    Extraocular Movements: Extraocular movements intact.     Pupils: Pupils are equal, round, and reactive to light.  Cardiovascular:     Rate and Rhythm: Normal rate.  Pulmonary:     Effort: Pulmonary effort is normal.  Musculoskeletal:     Lumbar back: Tenderness (over area outlined) present. No swelling, edema, deformity, signs of trauma, lacerations, spasms or bony tenderness. Normal range of motion. Negative right straight leg raise test and negative left straight leg raise test. No scoliosis.       Back:     Comments: Strength 5/5 for lower extremities.  Ambulates without assistance and into expected pace.  Skin:    General: Skin is warm and dry.  Neurological:     General: No focal deficit present.     Mental Status: She is alert and oriented to person, place, and time.     Motor: No weakness.     Coordination: Coordination normal.     Gait: Gait normal.     Deep Tendon Reflexes: Reflexes normal.  Psychiatric:        Mood and Affect: Mood normal.        Behavior: Behavior normal.        Thought Content: Thought content normal.        Judgment: Judgment normal.     Assessment and Plan :   I have reviewed the PDMP during this encounter.  1. Sciatica of right side   2. Right leg pain   3. Degenerative disc disease, lumbar     Patient is very high risk given her CAD, CHF and recent echo showing a 25 to 30% ejection fraction.   Counseled against a Toradol injection, steroids as such.  Recommend scheduling Tylenol, will send the prescription for tizanidine to her pharmacy.  Use narcotic pain medication as prescribed by the spine specialist.  Overall, physical exam findings reassuring and stable for outpatient management with very close follow-up with her spine specialist. Counseled patient on potential for adverse effects with medications prescribed/recommended today, ER and return-to-clinic precautions discussed, patient verbalized understanding.    Jaynee Eagles, [place] 05/06/20 [phone]

## 2020-05-07 NOTE — Addendum Note (Signed)
Addended by: Campbell Riches on: 05/07/2020 10:47 AM   Modules accepted: Orders

## 2020-05-08 ENCOUNTER — Other Ambulatory Visit: Payer: Self-pay | Admitting: Physician Assistant

## 2020-05-31 ENCOUNTER — Other Ambulatory Visit: Payer: Self-pay | Admitting: Internal Medicine

## 2020-06-07 ENCOUNTER — Encounter (HOSPITAL_COMMUNITY): Payer: Self-pay | Admitting: Emergency Medicine

## 2020-06-07 ENCOUNTER — Ambulatory Visit (HOSPITAL_COMMUNITY)
Admission: EM | Admit: 2020-06-07 | Discharge: 2020-06-07 | Disposition: A | Discharge status: Home / Self Care | Payer: Medicaid Other | Attending: Family Medicine | Admitting: Family Medicine

## 2020-06-07 ENCOUNTER — Other Ambulatory Visit: Payer: Self-pay

## 2020-06-07 DIAGNOSIS — R059 Cough, unspecified: Secondary | ICD-10-CM

## 2020-06-07 DIAGNOSIS — M545 Low back pain, unspecified: Secondary | ICD-10-CM | POA: Diagnosis not present

## 2020-06-07 DIAGNOSIS — G8929 Other chronic pain: Secondary | ICD-10-CM

## 2020-06-07 DIAGNOSIS — Z20822 Contact with and (suspected) exposure to covid-19: Secondary | ICD-10-CM | POA: Diagnosis present

## 2020-06-07 LAB — SARS CORONAVIRUS 2 (TAT 6-24 HRS): SARS Coronavirus 2: NEGATIVE

## 2020-06-07 MED ORDER — ALBUTEROL SULFATE HFA 108 (90 BASE) MCG/ACT IN AERS: 1.0000 | INHALATION_SPRAY | Freq: Four times a day (QID) | RESPIRATORY_TRACT | 2 refills | Status: DC | PRN

## 2020-06-07 MED ORDER — PROMETHAZINE-DM 6.25-15 MG/5ML PO SYRP: 5.0000 mL | ORAL_SOLUTION | Freq: Four times a day (QID) | ORAL | 0 refills | Status: DC | PRN

## 2020-06-07 MED ORDER — TIZANIDINE HCL 4 MG PO TABS
4.0000 mg | ORAL_TABLET | Freq: Four times a day (QID) | ORAL | 0 refills | Status: DC | PRN
Start: 2020-06-07 — End: 2020-07-23

## 2020-06-07 NOTE — ED Triage Notes (Signed)
Pt presents with low back pain and into right leg. States pain is a burning sensation. Also c/o of headache, runny nose and dry cough xs 2 days.

## 2020-06-07 NOTE — Discharge Instructions (Addendum)
You have been tested for COVID-19 today. If your test returns positive, you will receive a phone call from [facility] regarding your results. Negative test results are not called. Both positive and negative results area always visible on MyChart. If you do not have a MyChart account, sign up instructions are provided in your discharge papers. Please do not hesitate to contact us should you have questions or concerns.  Your blood pressure was noted to be elevated during your visit today. If you are currently taking medication for high blood pressure, please ensure you are taking this as directed. If you do not have a history of high blood pressure and your blood pressure remains persistently elevated, you may need to begin taking a medication at some point. You may return here within the next few days to recheck if unable to see your primary care provider or if do not have a one.  BP (!) 155/107 (BP Location: Left Arm)   Pulse (!) 102   Temp 98.4 F (36.9 C) (Tympanic)   Resp 18   SpO2 97%

## 2020-06-09 NOTE — ED Provider Notes (Signed)
[place]   [id number] 06/07/20 Arrival Time: [place]:  1. Chronic bilateral low back pain without sciatica   2. Cough   3. Contact with and (suspected) exposure to covid-19     COVID testing sent. OTC symptom care as necessary. Able to ambulate here and hemodynamically stable. No indication for imaging of back at this time given no trauma and normal neurological exam. Discussed. Acute on chronic exacerbation.   Meds ordered this encounter  Medications  . tiZANidine (ZANAFLEX) 4 MG tablet    Sig: Take 1 tablet (4 mg total) by mouth every 6 (six) hours as needed for muscle spasms.    Dispense:  30 tablet    Refill:  0  . albuterol (VENTOLIN HFA) 108 (90 Base) MCG/ACT inhaler    Sig: Inhale 1-2 puffs into the lungs every 6 (six) hours as needed for wheezing or shortness of breath.    Dispense:  18 g    Refill:  2  . promethazine-dextromethorphan (PROMETHAZINE-DM) 6.25-15 MG/5ML syrup    Sig: Take 5 mLs by mouth 4 (four) times daily as needed for cough.    Dispense:  118 mL    Refill:  0    Medication sedation precautions given. Encourage ROM/movement as tolerated.  Recommend:  Follow-up Information    Schedule an appointment as soon as possible for a visit  with Charlott Rakes, MD.   Specialty: Family Medicine Contact information: [place] [id number] [phone]               Reviewed expectations re: course of current medical issues. Questions answered. Outlined signs and symptoms indicating need for more acute intervention. Patient verbalized understanding. After Visit Summary given.   SUBJECTIVE: History from: patient.  Chelsea Branch is a 55 y.o. female who reports acute on chronic LBP exacerbation; past week; radiates to R leg at times; burning sensation that is transient. Normal bowel/bladder habits. Ambulatory without difficulty. Also with runny nose and dry cough; 2 days; afebrile; no SOB. Reports  COVID exposure.  Social History   Tobacco Use  Smoking Status Former Smoker  . Packs/day: 0.10  . Years: 30.00  . Pack years: 3.00  . Types: Cigarettes  . Quit date: 06/03/2015  . Years since quitting: 5.0  Smokeless Tobacco Never Used  Tobacco Comment   Pt. stated she stopped smoking a year ago. 09/29/2018    OBJECTIVE:  Vitals:   06/07/20 1353  BP: (!) 155/107  Pulse: (!) 102  Resp: 18  Temp: 98.4 F (36.9 C)  TempSrc: Tympanic  SpO2: 97%    General appearance: alert; no distress HEENT: [place]; AT Neck: supple with FROM; without midline tenderness CV: regular Lungs: unlabored respirations; speaks full sentences without difficulty; dry cough Abdomen: soft, non-tender; non-distended Back: poorly localized tenderness to palpation over lumbar paraspinal musculature; FROM at waist; bruising: none; without midline tenderness Extremities: without edema; symmetrical without gross deformities; normal ROM of bilateral LE Skin: warm and dry Neurologic: normal gait; normal sensation and strength of bilateral LE Psychological: alert and cooperative; normal mood and affect  Labs:  Labs Reviewed  SARS CORONAVIRUS 2 (TAT 6-24 HRS)      Allergies  Allergen Reactions  . Ace Inhibitors Other (See Comments)    REACTION: Cough    Past Medical History:  Diagnosis Date  . Abnormal thyroid blood test 03/15/2017  . Abnormal uterine bleeding   . Alopecia   . Anemia   . Angina pectoris with  normal coronary arteriogram ([place]) 2017   Had + Troponin c/w ? NSTEMI due to A on C CHF  . 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 Chronic combined systolic and diastolic CHF (congestive heart failure) ([place])   . DYSLIPIDEMIA   . Essential hypertension   . Fibroids   . H/O noncompliance with medical treatment, presenting hazards to health   . Hypertension   . Hypokalemia 07/23/2016  . Menorrhagia   . 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 iniatially ?2/2 peripartum in 1994 -  improved by 2008 then worsening EF in 2011 back down to EF 25-30%. b. echo 01/21/14 showed mod LVH, [name]; [name], [name], [name],   . 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 NSTEMI (non-ST elevated myocardial infarction) ([place]) 05/2015   Normal Coronaries.  . Peripartum cardiomyopathy 1994  . Sleep apnea 2015   CPAP 12/2013  . Stroke [facility]) 2011  . Systolic and diastolic CHF, acute on chronic ([place]) 06/14/2015  . Tobacco abuse    Social History   Socioeconomic History  . Marital status: Single    Spouse name: Not on file  . Number of children: 3  . Years of education: 69  . Highest education level: Not on file  Occupational History  . Occupation: unemployed  Tobacco Use  . Smoking status: Former Smoker    Packs/day: 0.10    Years: 30.00    Pack years: 3.00    Types: Cigarettes    Quit date: 06/03/2015    Years since quitting: 5.0  . Smokeless tobacco: Never Used  . Tobacco comment: Pt. stated she stopped smoking a year ago. 09/29/2018  Vaping Use  . Vaping Use: Never used  Substance and Sexual Activity  . Alcohol use: No    Alcohol/week: 0.0 standard drinks  . Drug use: No  . Sexual activity: Yes    Birth control/protection: I.U.D.  Other Topics Concern  . Not on file  Social History Narrative   Lives at home with 56 yo twins (31 and [place])   60 yo son lives outside the home   56 yo granddaughter    Social Determinants of Health   Financial Resource Strain: Not on file  Food Insecurity: Not on file  Transportation Needs: Not on file  Physical Activity: Not on file  Stress: Not on file  Social Connections: Not on file  Intimate Partner Violence: Not on file   Family History  Problem Relation Age of Onset  . Cancer Maternal Grandmother        uterine  . Hypertension Sister   . Healthy Mother   . Other Neg Hx   . Heart disease Neg Hx    Past Surgical History:  Procedure Laterality Date  . CARDIAC CATHETERIZATION  N/A 06/16/2015   Procedure: Left Heart Cath and Coronary Angiography;  Surgeon: Jettie Booze, MD;  Location: [facility];  Service: Cardiovascular;  Laterality: N/A;  . [place]  . LOOP RECORDER IMPLANT  ~ 2000  . TIBIAL TUBERCLERPLASTY    . TUBAL LIGATION  1994     Vanessa Kick, MD 06/09/20 1400

## 2020-06-24 ENCOUNTER — Encounter (HOSPITAL_COMMUNITY): Payer: Self-pay | Admitting: *Deleted

## 2020-06-24 ENCOUNTER — Other Ambulatory Visit: Payer: Self-pay

## 2020-06-24 ENCOUNTER — Ambulatory Visit (HOSPITAL_COMMUNITY)
Admission: EM | Admit: 2020-06-24 | Discharge: 2020-06-24 | Disposition: A | Discharge status: Home / Self Care | Payer: Medicaid Other | Attending: Student | Admitting: Student

## 2020-06-24 DIAGNOSIS — J069 Acute upper respiratory infection, unspecified: Secondary | ICD-10-CM

## 2020-06-24 DIAGNOSIS — I10 Essential (primary) hypertension: Secondary | ICD-10-CM | POA: Diagnosis not present

## 2020-06-24 DIAGNOSIS — Z1152 Encounter for screening for COVID-19: Secondary | ICD-10-CM | POA: Diagnosis not present

## 2020-06-24 DIAGNOSIS — I252 Old myocardial infarction: Secondary | ICD-10-CM

## 2020-06-24 MED ORDER — IBUPROFEN 800 MG PO TABS: 800.0000 mg | ORAL_TABLET | Freq: Three times a day (TID) | ORAL | 1 refills | Status: DC

## 2020-06-24 NOTE — Discharge Instructions (Signed)
-  For fevers/chills, body aches, headaches- use Tylenol and Ibuprofen. You can alternate these for maximum effect. Use up to 3000mg  Tylenol daily and 3200mg  Ibuprofen daily. Make sure to take ibuprofen with food. Check the bottle of ibuprofen/tylenol for specific dosage instructions.  We are currently awaiting result of your PCR covid-19 test. This typically comes back in 1-2 days. We'll call you if the result is positive. Otherwise, the result will be sent electronically to your MyChart. You can also call this clinic and ask for your result via telephone.   Please isolate at home while awaiting these results. If your test is positive for Covid-19, continue to isolate at home for 5 days if you have mild symptoms, or a total of 10 days from symptom onset if you have more severe symptoms. If you quarantine for a shorter period of time (i.e. 5 days), make sure to wear a mask until day 10 of symptoms. Treat your symptoms at home with OTC remedies like tylenol/ibuprofen, mucinex, nyquil, etc. Seek medical attention if you develop high fevers, chest pain, shortness of breath, ear pain, facial pain, etc. Make sure to get up and move around every 2-3 hours while convalescing to help prevent blood clots. Drink plenty of fluids, and rest as much as possible.

## 2020-06-24 NOTE — ED Triage Notes (Signed)
Pt reports sore throat ,runny nose ,dry cough and fatigue.

## 2020-06-24 NOTE — ED Provider Notes (Signed)
[facility]    CSN: [id number] Arrival date & time: 06/24/20  8721      History   Chief Complaint Chief Complaint  Patient presents with  . Sore Throat  . Nasal Congestion  . Fatigue    HPI Chelsea Branch is a 55 y.o. female Presenting for URI symptoms for 2 days. History of anemia, hypertentsion, cardiomyopathy, NSTEMI, tobacco use. Presenting today for sore throat, congestion, cough, fatigue.  Denies fevers/chills, n/v/d, shortness of breath, chest pain,facial pain, teeth pain, headaches,  loss of taste/smell, swollen lymph nodes, ear pain. Denies chest pain, shortness of breath, confusion, high fevers.     HPI  Past Medical History:  Diagnosis Date  . Abnormal thyroid blood test 03/15/2017  . Abnormal uterine bleeding   . Alopecia   . Anemia   . Angina pectoris with normal coronary arteriogram ([facility]) 2017   Had + Troponin c/w ? NSTEMI due to A on C CHF  . 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 Chronic combined systolic and diastolic CHF (congestive heart failure) ([facility])   . DYSLIPIDEMIA   . Essential hypertension   . Fibroids   . H/O noncompliance with medical treatment, presenting hazards to health   . Hypertension   . Hypokalemia 07/23/2016  . Menorrhagia   . Nonischemic cardiomyopathy ([facility]) 1994; 2017   a. iniatially ?2/2 peripartum in 1994 - improved by 2008 then worsening EF in 2011 back down to EF 25-30%. b. echo 01/21/14 showed mod LVH, [name]; [name], [name], [name],   . 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  . NSTEMI (non-ST elevated myocardial infarction) ([facility]) 05/2015   Normal Coronaries.  . Peripartum cardiomyopathy 1994  . Sleep apnea 2015   CPAP 12/2013  . Stroke [facility]) 2011  . Systolic and diastolic CHF, acute on chronic (H[facility]) 06/14/2015  . Tobacco abuse     Patient Active Problem List   Diagnosis Date Noted  . Degenerative disc disease at L5-S1 level 10/10/2018  . Abnormal thyroid blood test  03/15/2017  . Hypokalemia 07/23/2016  . Low TSH level 04/26/2016  . Abnormal uterine bleeding 04/06/2016  . Nonspecific chest pain   . 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  . NSTEMI (non-ST elevated myocardial infarction) ([facility]) - with normal cornaries on Cath 06/14/2015  . Systolic and diastolic CHF, acute on chronic ([facility]) 06/14/2015  . H/O noncompliance with medical treatment, presenting hazards to health   . Low back pain 09/16/2014  . Homelessness 09/16/2014  . Vitamin D deficiency 01/29/2014  . OSA (obstructive sleep apnea) 01/08/2014  . Chronic combined systolic and diastolic CHF (congestive heart failure) (H[facility]) 08/14/2013  . Essential hypertension   . Fibroids 08/09/2012  . Menorrhagia 05/08/2012  . Female pattern hair loss   . HLD (hyperlipidemia) 06/08/2010  . CEREBRAL ANEURYSM 06/08/2010  . 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 CEREBROVASCULAR ACCIDENT, HX OF 06/08/2010    Past Surgical History:  Procedure Laterality Date  . CARDIAC CATHETERIZATION N/A 06/16/2015   Procedure: Left Heart Cath and Coronary Angiography;  Surgeon: Corky Crafts, MD;  Location: [facility];  Service: Cardiovascular;  Laterality: N/A;  . CESAREAN SECTION  1992  1994  . LOOP RECORDER IMPLANT  ~ 2000  . TIBIAL TUBERCLERPLASTY    . TUBAL LIGATION  1994    OB History    Gravida  2   Para  2   Term  2   Preterm      AB  Living  3     SAB      IAB      Ectopic      Multiple  1   Live Births  3            Home Medications    Prior to Admission medications   Medication Sig Start Date End Date Taking? Authorizing Provider  albuterol (VENTOLIN HFA) 108 (90 Base) MCG/ACT inhaler Inhale 1-2 puffs into the lungs every 6 (six) hours as needed for wheezing or shortness of breath. 06/07/20  Yes Vanessa Kick, MD  aspirin 81 MG EC tablet Take 1 tablet (81 mg total) by mouth daily. 03/31/16  Yes Milagros Loll, MD  cetirizine (ZYRTEC) 10 MG  tablet Take 1 tablet (10 mg total) by mouth daily. 09/13/19  Yes Bast, Traci A, NP  diclofenac Sodium (VOLTAREN) 1 % GEL Apply 4 g topically 4 (four) times daily. 02/11/20  Yes Darr, Edison Nasuti, PA-C  HYDROcodone-acetaminophen (NORCO/VICODIN) 5-325 MG tablet Take 1 tablet by mouth every 6 (six) hours as needed for severe pain. 03/24/20  Yes Jaynee Eagles, PA-C  ibuprofen (ADVIL) 800 MG tablet Take 1 tablet (800 mg total) by mouth 3 (three) times daily. 06/24/20  Yes Hazel Sams, PA-C  magnesium oxide (MAG-OX) 400 MG tablet TAKE 1 CAPSULE (400 MG TOTAL) BY MOUTH 2 (TWO) TIMES DAILY. 01/22/20  Yes Deboraha Sprang, MD  metoprolol succinate (TOPROL-XL) 50 MG 24 hr tablet Take 1 tablet (50 mg total) by mouth daily. 05/02/20  Yes Deboraha Sprang, MD  Misc. Devices MISC Please provide patient with insurance approved straight cane 09/29/18  Yes Gildardo Pounds, NP  Misc. Devices MISC Single-point cane with adjustable height x1, shower chair x1, transfer bench x1.  Diagnosis congestive heart failure, degenerative disc disease of the lumbar spine. 11/23/18  Yes Newlin, Charlane Ferretti, MD  potassium chloride SA (KLOR-CON M20) 20 MEQ tablet TAKE 1-2 TABS BY MOUTH AS DIRECTED. TAKE 2 TABLETS IN THE MORNING AND 1 TABLET IN THE EVENING 05/09/20  Yes Weaver, Scott T, PA-C  promethazine-dextromethorphan (PROMETHAZINE-DM) 6.25-15 MG/5ML syrup Take 5 mLs by mouth 4 (four) times daily as needed for cough. 06/07/20  Yes Hagler, Aaron Edelman, MD  sacubitril-valsartan (ENTRESTO) 49-51 MG Take 0.5 tablets by mouth 2 (two) times daily. 06/03/20  Yes Deboraha Sprang, MD  simvastatin (ZOCOR) 10 MG tablet TAKE 1 TABLET BY MOUTH EVERY DAY IN THE EVENING 04/14/20  Yes Deboraha Sprang, MD  spironolactone (ALDACTONE) 25 MG tablet Take 0.5 tablets (12.5 mg total) by mouth daily. 05/02/20  Yes Deboraha Sprang, MD  tiZANidine (ZANAFLEX) 4 MG tablet Take 1 tablet (4 mg total) by mouth every 6 (six) hours as needed for muscle spasms. 06/07/20  Yes Hagler, Aaron Edelman, MD   torsemide (DEMADEX) 20 MG tablet Take 1 tablet (20 mg total) by mouth daily. 03/05/20  Yes Weaver, Scott T, PA-C  diphenhydrAMINE (BENADRYL) 25 MG tablet Take 1 tablet (25 mg total) by mouth every 6 (six) hours. 09/13/19   Loura Halt A, NP  Menthol (CEPACOL SORE THROAT) 5.4 MG LOZG Use as directed 1 lozenge (5.4 mg total) in the mouth or throat every 2 (two) hours as needed. 02/11/20   Darr, Edison Nasuti, PA-C  mupirocin ointment (BACTROBAN) 2 % Apply 1 application topically 2 (two) times daily. 11/03/19   Wieters, Hallie C, PA-C  traMADol (ULTRAM) 50 MG tablet Take 50 mg by mouth 3 (three) times daily as needed. 04/10/20   [provider]  triamcinolone cream (KENALOG) 0.1 % Apply 1 application topically 2 (two) times daily. 11/03/19   Wieters, Hallie C, PA-C  DULoxetine (CYMBALTA) 30 MG capsule Take 1 capsule (30 mg total) by mouth daily. 12/07/18 12/10/19  Charlott Rakes, MD  famotidine (PEPCID) 20 MG tablet Take 1 tablet (20 mg total) 2 (two) times daily by mouth. 04/10/17 11/03/19  Albesa Seen, PA-C    Family History Family History  Problem Relation Age of Onset  . Cancer Maternal Grandmother        uterine  . Hypertension Sister   . Healthy Mother   . Other Neg Hx   . Heart disease Neg Hx     Social History Social History   Tobacco Use  . Smoking status: Former Smoker    Packs/day: 0.10    Years: 30.00    Pack years: 3.00    Types: Cigarettes    Quit date: 06/03/2015    Years since quitting: 5.0  . Smokeless tobacco: Never Used  . Tobacco comment: Pt. stated she stopped smoking a year ago. 09/29/2018  Vaping Use  . Vaping Use: Never used  Substance Use Topics  . Alcohol use: No    Alcohol/week: 0.0 standard drinks  . Drug use: No     Allergies   Ace inhibitors   Review of Systems Review of Systems  Constitutional: Negative for appetite change, chills and fever.  HENT: Positive for congestion and sore throat. Negative for ear pain, rhinorrhea, sinus pressure  and sinus pain.   Eyes: Negative for redness and visual disturbance.  Respiratory: Positive for cough. Negative for chest tightness, shortness of breath and wheezing.   Cardiovascular: Negative for chest pain and palpitations.  Gastrointestinal: Negative for abdominal pain, constipation, diarrhea, nausea and vomiting.  Genitourinary: Negative for dysuria, frequency and urgency.  Musculoskeletal: Negative for myalgias.  Neurological: Negative for dizziness, weakness and headaches.  Psychiatric/Behavioral: Negative for confusion.  All other systems reviewed and are negative.    Physical Exam Triage Vital Signs ED Triage Vitals  Enc Vitals Group     BP 06/24/20 0841 113/66     Pulse Rate 06/24/20 0841 80     Resp 06/24/20 0841 18     Temp 06/24/20 0841 98.1 F (36.7 C)     Temp Source 06/24/20 0841 Oral     SpO2 06/24/20 0841 100 %     Weight --      Height --      Head Circumference --      Peak Flow --      Pain Score 06/24/20 0833 8     Pain Loc --      Pain Edu? --      Excl. in [place]? --    No data found.  Updated Vital Signs BP 113/66 (BP Location: Right Arm)   Pulse 80   Temp 98.1 F (36.7 C) (Oral)   Resp 18   SpO2 100%   Visual Acuity Right Eye Distance:   Left Eye Distance:   Bilateral Distance:    Right Eye Near:   Left Eye Near:    Bilateral Near:     Physical Exam Vitals reviewed.  Constitutional:      General: She is not in acute distress.    Appearance: Normal appearance. She is not ill-appearing.  HENT:     Head: Normocephalic and atraumatic.     Right Ear: Hearing, tympanic membrane, ear canal and external ear normal. No swelling or tenderness. There is  no impacted cerumen. No mastoid tenderness. Tympanic membrane is not perforated, erythematous, retracted or bulging.     Left Ear: Hearing, tympanic membrane, ear canal and external ear normal. No swelling or tenderness. There is no impacted cerumen. No mastoid tenderness. Tympanic membrane is not  perforated, erythematous, retracted or bulging.     Nose:     Right Sinus: No maxillary sinus tenderness or frontal sinus tenderness.     Left Sinus: No maxillary sinus tenderness or frontal sinus tenderness.     Mouth/Throat:     Mouth: Mucous membranes are moist.     Pharynx: Uvula midline. No oropharyngeal exudate or posterior oropharyngeal erythema.     Tonsils: No tonsillar exudate.  Cardiovascular:     Rate and Rhythm: Normal rate and regular rhythm.     Heart sounds: Normal heart sounds.  Pulmonary:     Breath sounds: Normal breath sounds and air entry. No wheezing, rhonchi or rales.  Chest:     Chest wall: No tenderness.  Abdominal:     General: Abdomen is flat. Bowel sounds are normal.     Tenderness: There is no abdominal tenderness. There is no guarding or rebound.  Lymphadenopathy:     Cervical: No cervical adenopathy.  Neurological:     General: No focal deficit present.     Mental Status: She is alert and oriented to person, place, and time.  Psychiatric:        Attention and Perception: Attention and perception normal.        Mood and Affect: Mood and affect normal.        Behavior: Behavior normal. Behavior is cooperative.        Thought Content: Thought content normal.        Judgment: Judgment normal.      UC Treatments / Results  Labs (all labs ordered are listed, but only abnormal results are displayed) Labs Reviewed  SARS CORONAVIRUS 2 (TAT 6-24 HRS)    EKG   Radiology No results found.  Procedures Procedures (including critical care time)  Medications Ordered in UC Medications - No data to display  Initial Impression / Assessment and Plan / UC Course  I have reviewed the triage vital signs and the nursing notes.  Pertinent labs & imaging results that were available during my care of the patient were reviewed by me and considered in my medical decision making (see chart for details).      For URI- Tylenol/ibuprofen, OTC medications as  below. Covid test sent.  Hypertension- continue current regimen.  History NSTEMI- denies chest pain/pressure, shortness of breath, etc.  Return precautions- chest pain, shortness of breath, new/worsening fevers/chills, confusion, worsening of symptoms despite the above treatment plan, etc.   Spent over 40 minutes obtaining H&P, performing physical, discussing results, treatment plan and plan for follow-up with patient. Patient agrees with plan.   Final Clinical Impressions(s) / UC Diagnoses   Final diagnoses:  Upper respiratory infection with cough and congestion  Encounter for screening for COVID-19  Essential hypertension  History of non-ST elevation myocardial infarction (NSTEMI)     Discharge Instructions     -For fevers/chills, body aches, headaches- use Tylenol and Ibuprofen. You can alternate these for maximum effect. Use up to 3000mg  Tylenol daily and 3200mg  Ibuprofen daily. Make sure to take ibuprofen with food. Check the bottle of ibuprofen/tylenol for specific dosage instructions.  We are currently awaiting result of your PCR covid-19 test. This typically comes back in 1-2 days. We'll call  you if the result is positive. Otherwise, the result will be sent electronically to your MyChart. You can also call this clinic and ask for your result via telephone.   Please isolate at home while awaiting these results. If your test is positive for Covid-19, continue to isolate at home for 5 days if you have mild symptoms, or a total of 10 days from symptom onset if you have more severe symptoms. If you quarantine for a shorter period of time (i.e. 5 days), make sure to wear a mask until day 10 of symptoms. Treat your symptoms at home with OTC remedies like tylenol/ibuprofen, mucinex, nyquil, etc. Seek medical attention if you develop high fevers, chest pain, shortness of breath, ear pain, facial pain, etc. Make sure to get up and move around every 2-3 hours while convalescing to help prevent  blood clots. Drink plenty of fluids, and rest as much as possible.     ED Prescriptions    Medication Sig Dispense Auth. Provider   ibuprofen (ADVIL) 800 MG tablet Take 1 tablet (800 mg total) by mouth 3 (three) times daily. 21 tablet Hazel Sams, PA-C     PDMP not reviewed this encounter.   Hazel Sams, PA-C 06/24/20 1002

## 2020-07-23 ENCOUNTER — Encounter (HOSPITAL_COMMUNITY): Payer: Self-pay | Admitting: Emergency Medicine

## 2020-07-23 ENCOUNTER — Ambulatory Visit (HOSPITAL_COMMUNITY)
Admission: EM | Admit: 2020-07-23 | Discharge: 2020-07-23 | Disposition: A | Discharge status: Home / Self Care | Payer: Medicaid Other | Attending: Family Medicine | Admitting: Family Medicine

## 2020-07-23 ENCOUNTER — Other Ambulatory Visit: Payer: Self-pay

## 2020-07-23 DIAGNOSIS — G5703 Lesion of sciatic nerve, bilateral lower limbs: Secondary | ICD-10-CM

## 2020-07-23 DIAGNOSIS — G8929 Other chronic pain: Secondary | ICD-10-CM | POA: Diagnosis not present

## 2020-07-23 DIAGNOSIS — M5442 Lumbago with sciatica, left side: Secondary | ICD-10-CM | POA: Diagnosis not present

## 2020-07-23 DIAGNOSIS — M5441 Lumbago with sciatica, right side: Secondary | ICD-10-CM | POA: Diagnosis not present

## 2020-07-23 MED ORDER — TIZANIDINE HCL 4 MG PO TABS: 4.0000 mg | ORAL_TABLET | Freq: Three times a day (TID) | ORAL | 0 refills | Status: DC | PRN

## 2020-07-23 MED ORDER — DICLOFENAC SODIUM 1 % EX GEL: 4.0000 g | Freq: Four times a day (QID) | CUTANEOUS | 0 refills | Status: DC | PRN

## 2020-07-23 NOTE — ED Triage Notes (Signed)
Pt presents today with c/o of mid back pain that radiates to both right and left side and down both legs x 2 weeks. Denies new injury. Ambulatory.

## 2020-07-23 NOTE — ED Provider Notes (Signed)
[place]    CSN: [id number] Arrival date & time: 07/23/20  3329      History   Chief Complaint Chief Complaint  Patient presents with  . Back Pain    HPI Chelsea Branch is a 55 y.o. female.   Patient presenting today with 1 to 2-week history of bilateral low back pain extending down laterally bilateral hips and shooting down both legs.  Some tingling sensation down the legs but no numbness, weakness, bowel or bladder incontinence, fever.  No new injury but states she works on [employer] where she is standing all day and reaching side-to-side and picking up heavy items.  Per record review history of degenerative disc disease and chronic low back pain, intermittent issues with sciatica.  Taking over-the-counter pain relievers with minimal relief at this time.     Past Medical History:  Diagnosis Date  . Abnormal thyroid blood test 03/15/2017  . Abnormal uterine bleeding   . Alopecia   . Anemia   . Angina pectoris with normal coronary arteriogram ([place]) 2017   Had + Troponin c/w ? NSTEMI due to A on C CHF  . 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 Chronic combined systolic and diastolic CHF (congestive heart failure) ([place])   . DYSLIPIDEMIA   . Essential hypertension   . Fibroids   . H/O noncompliance with medical treatment, presenting hazards to health   . Hypertension   . Hypokalemia 07/23/2016  . Menorrhagia   . 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 iniatially ?2/2 peripartum in 1994 - improved by 2008 then worsening EF in 2011 back down to EF 25-30%. b. echo 01/21/14 showed mod LVH, [name]; [name], [name], [name],   . 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 NSTEMI (non-ST elevated myocardial infarction) ([place]) 05/2015   Normal Coronaries.  . Peripartum cardiomyopathy 1994  . Sleep apnea 2015   CPAP 12/2013  . Stroke [facility]) 2011  . Systolic and diastolic CHF, acute on chronic ([place]) 06/14/2015  . Tobacco abuse      Patient Active Problem List   Diagnosis Date Noted  . Degenerative disc disease at L5-S1 level 10/10/2018  . Abnormal thyroid blood test 03/15/2017  . Hypokalemia 07/23/2016  . Low TSH level 04/26/2016  . Abnormal uterine bleeding 04/06/2016  . Nonspecific chest pain   . 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 NSTEMI (non-ST elevated myocardial infarction) ([place]) - with normal cornaries on Cath 06/14/2015  . Systolic and diastolic CHF, acute on chronic ([place]) 06/14/2015  . H/O noncompliance with medical treatment, presenting hazards to health   . Low back pain 09/16/2014  . Homelessness 09/16/2014  . Vitamin D deficiency 01/29/2014  . OSA (obstructive sleep apnea) 01/08/2014  . Chronic combined systolic and diastolic CHF (congestive heart failure) ([place]) 08/14/2013  . Essential hypertension   . Fibroids 08/09/2012  . Menorrhagia 05/08/2012  . Female pattern hair loss   . HLD (hyperlipidemia) 06/08/2010  . CEREBRAL ANEURYSM 06/08/2010  . 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 CEREBROVASCULAR ACCIDENT, HX OF 06/08/2010    Past Surgical History:  Procedure Laterality Date  . CARDIAC CATHETERIZATION N/A 06/16/2015   Procedure: Left Heart Cath and Coronary Angiography;  Surgeon: Jettie Booze, MD;  Location: [facility];  Service: Cardiovascular;  Laterality: N/A;  . [place]  . LOOP RECORDER IMPLANT  ~ 2000  . TIBIAL TUBERCLERPLASTY    . [place]  OB History    Gravida  2   Para  2   Term  2   Preterm      AB      Living  3     SAB      IAB      Ectopic      Multiple  1   Live Births  3            Home Medications    Prior to Admission medications   Medication Sig Start Date End Date Taking? Authorizing Provider  albuterol (VENTOLIN HFA) 108 (90 Base) MCG/ACT inhaler Inhale 1-2 puffs into the lungs every 6 (six) hours as needed for wheezing or shortness of breath. 06/07/20  Yes Vanessa Kick, MD  aspirin 81 MG EC tablet Take 1 tablet (81 mg total) by mouth daily. 03/31/16  Yes Milagros Loll, MD  cetirizine (ZYRTEC) 10 MG tablet Take 1 tablet (10 mg total) by mouth daily. 09/13/19  Yes Bast, Traci A, NP  HYDROcodone-acetaminophen (NORCO/VICODIN) 5-325 MG tablet Take 1 tablet by mouth every 6 (six) hours as needed for severe pain. 03/24/20  Yes Jaynee Eagles, PA-C  ibuprofen (ADVIL) 800 MG tablet Take 1 tablet (800 mg total) by mouth 3 (three) times daily. 06/24/20  Yes Hazel Sams, PA-C  magnesium oxide (MAG-OX) 400 MG tablet TAKE 1 CAPSULE (400 MG TOTAL) BY MOUTH 2 (TWO) TIMES DAILY. 01/22/20  Yes Deboraha Sprang, MD  potassium chloride SA (KLOR-CON M20) 20 MEQ tablet TAKE 1-2 TABS BY MOUTH AS DIRECTED. TAKE 2 TABLETS IN THE MORNING AND 1 TABLET IN THE EVENING 05/09/20  Yes Weaver, Scott T, PA-C  sacubitril-valsartan (ENTRESTO) 49-51 MG Take 0.5 tablets by mouth 2 (two) times daily. 06/03/20  Yes Deboraha Sprang, MD  simvastatin (ZOCOR) 10 MG tablet TAKE 1 TABLET BY MOUTH EVERY DAY IN THE EVENING 04/14/20  Yes Deboraha Sprang, MD  spironolactone (ALDACTONE) 25 MG tablet Take 0.5 tablets (12.5 mg total) by mouth daily. 05/02/20  Yes Deboraha Sprang, MD  torsemide (DEMADEX) 20 MG tablet Take 1 tablet (20 mg total) by mouth daily. 03/05/20  Yes Weaver, Scott T, PA-C  traMADol (ULTRAM) 50 MG tablet Take 50 mg by mouth 3 (three) times daily as needed. 04/10/20  Yes [provider]  triamcinolone cream (KENALOG) 0.1 % Apply 1 application topically 2 (two) times daily. 11/03/19  Yes Wieters, Hallie C, PA-C  diclofenac Sodium (VOLTAREN) 1 % GEL Apply 4 g topically 4 (four) times daily as needed. 07/23/20   Volney American, PA-C  diphenhydrAMINE (BENADRYL) 25 MG tablet Take 1 tablet (25 mg total) by mouth every 6 (six) hours. 09/13/19   Loura Halt A, NP  Menthol (CEPACOL SORE THROAT) 5.4 MG LOZG Use as directed 1 lozenge (5.4 mg total) in the mouth or throat every 2 (two) hours  as needed. 02/11/20   Darr, Edison Nasuti, PA-C  metoprolol succinate (TOPROL-XL) 50 MG 24 hr tablet Take 1 tablet (50 mg total) by mouth daily. 05/02/20   Deboraha Sprang, MD  Misc. Devices MISC Please provide patient with insurance approved straight cane 09/29/18   Gildardo Pounds, NP  Misc. Devices MISC Single-point cane with adjustable height x1, shower chair x1, transfer bench x1.  Diagnosis congestive heart failure, degenerative disc disease of the lumbar spine. 11/23/18   Charlott Rakes, MD  mupirocin ointment (BACTROBAN) 2 % Apply 1 application topically 2 (two) times daily. 11/03/19   Wieters, Elesa Hacker,  PA-C  promethazine-dextromethorphan (PROMETHAZINE-DM) 6.25-15 MG/5ML syrup Take 5 mLs by mouth 4 (four) times daily as needed for cough. 06/07/20   Vanessa Kick, MD  tiZANidine (ZANAFLEX) 4 MG tablet Take 1 tablet (4 mg total) by mouth every 8 (eight) hours as needed for muscle spasms. DO NOT DRINK ALCOHOL OR DRIVE WHILE TAKING THIS MEDICATION 07/23/20   Volney American, PA-C  DULoxetine (CYMBALTA) 30 MG capsule Take 1 capsule (30 mg total) by mouth daily. 12/07/18 12/10/19  Charlott Rakes, MD  famotidine (PEPCID) 20 MG tablet Take 1 tablet (20 mg total) 2 (two) times daily by mouth. 04/10/17 11/03/19  Albesa Seen, PA-C    Family History Family History  Problem Relation Age of Onset  . Cancer Maternal Grandmother        uterine  . Hypertension Sister   . Healthy Mother   . Other Neg Hx   . Heart disease Neg Hx     Social History Social History   Tobacco Use  . Smoking status: Former Smoker    Packs/day: 0.10    Years: 30.00    Pack years: 3.00    Types: Cigarettes    Quit date: 06/03/2015    Years since quitting: 5.1  . Smokeless tobacco: Never Used  . Tobacco comment: Pt. stated she stopped smoking a year ago. 09/29/2018  Vaping Use  . Vaping Use: Never used  Substance Use Topics  . Alcohol use: No    Alcohol/week: 0.0 standard drinks  . Drug use: No     Allergies    Ace inhibitors   Review of Systems Review of Systems Per HPI  Physical Exam Triage Vital Signs ED Triage Vitals  Enc Vitals Group     BP 07/23/20 0826 122/79     Pulse Rate 07/23/20 0826 66     Resp 07/23/20 0826 16     Temp 07/23/20 0826 97.7 F (36.5 C)     Temp Source 07/23/20 0826 Oral     SpO2 07/23/20 0826 95 %     Weight --      Height --      Head Circumference --      Peak Flow --      Pain Score 07/23/20 0821 5     Pain Loc --      Pain Edu? --      Excl. in [place]? --    No data found.  Updated Vital Signs BP 122/79 (BP Location: Right Arm)   Pulse 66   Temp 97.7 F (36.5 C) (Oral)   Resp 16   SpO2 95%   Visual Acuity Right Eye Distance:   Left Eye Distance:   Bilateral Distance:    Right Eye Near:   Left Eye Near:    Bilateral Near:     Physical Exam Vitals and nursing note reviewed.  Constitutional:      Appearance: Normal appearance. She is not ill-appearing.  HENT:     Head: Atraumatic.  Eyes:     Extraocular Movements: Extraocular movements intact.     Conjunctiva/sclera: Conjunctivae normal.  Cardiovascular:     Rate and Rhythm: Normal rate and regular rhythm.     Pulses: Normal pulses.     Heart sounds: Normal heart sounds.  Pulmonary:     Effort: Pulmonary effort is normal.     Breath sounds: Normal breath sounds.  Musculoskeletal:        General: Tenderness present. No deformity or signs of injury. Normal range  of motion.     Cervical back: Normal range of motion and neck supple.     Comments: Negative straight leg raise bilateral lower extremities No midline spinal tenderness to palpation diffusely Bilateral lateral lumbar musculature tender to palpation extending down lateral hips  Skin:    General: Skin is warm and dry.     Findings: No erythema.  Neurological:     Mental Status: She is alert and oriented to person, place, and time.     Sensory: No sensory deficit.  Psychiatric:        Mood and Affect: Mood normal.         Thought Content: Thought content normal.        Judgment: Judgment normal.     UC Treatments / Results  Labs (all labs ordered are listed, but only abnormal results are displayed) Labs Reviewed - No data to display  EKG   Radiology No results found.  Procedures Procedures (including critical care time)  Medications Ordered in UC Medications - No data to display  Initial Impression / Assessment and Plan / UC Course  I have reviewed the triage vital signs and the nursing notes.  Pertinent labs & imaging results that were available during my care of the patient were reviewed by me and considered in my medical decision making (see chart for details).     Appears to be an acute on chronic issue, possibly some piriformis syndrome given the distribution of her muscular pain and tenderness.  Will treat with tizanidine, Voltaren gel to the areas as needed, stretches, heat, rest.  Work note given.  Return for acutely worsening symptoms.  Final Clinical Impressions(s) / UC Diagnoses   Final diagnoses:  Chronic bilateral low back pain with bilateral sciatica  Piriformis syndrome of both sides   Discharge Instructions   None    ED Prescriptions    Medication Sig Dispense Auth. Provider   diclofenac Sodium (VOLTAREN) 1 % GEL Apply 4 g topically 4 (four) times daily as needed. 100 g Volney American, PA-C   tiZANidine (ZANAFLEX) 4 MG tablet Take 1 tablet (4 mg total) by mouth every 8 (eight) hours as needed for muscle spasms. DO NOT DRINK ALCOHOL OR DRIVE WHILE TAKING THIS MEDICATION 30 tablet Volney American, [place]     PDMP not reviewed this encounter.   Volney American, [place] 07/23/20 [phone]

## 2020-08-19 ENCOUNTER — Other Ambulatory Visit: Payer: Self-pay

## 2020-08-19 ENCOUNTER — Ambulatory Visit (HOSPITAL_COMMUNITY)
Admission: EM | Admit: 2020-08-19 | Discharge: 2020-08-19 | Disposition: A | Discharge status: Home / Self Care | Payer: Medicaid Other | Attending: Urgent Care | Admitting: Urgent Care

## 2020-08-19 ENCOUNTER — Encounter (HOSPITAL_COMMUNITY): Payer: Self-pay | Admitting: Emergency Medicine

## 2020-08-19 DIAGNOSIS — R070 Pain in throat: Secondary | ICD-10-CM | POA: Insufficient documentation

## 2020-08-19 DIAGNOSIS — Z20822 Contact with and (suspected) exposure to covid-19: Secondary | ICD-10-CM | POA: Insufficient documentation

## 2020-08-19 DIAGNOSIS — Z79899 Other long term (current) drug therapy: Secondary | ICD-10-CM | POA: Insufficient documentation

## 2020-08-19 DIAGNOSIS — H9203 Otalgia, bilateral: Secondary | ICD-10-CM | POA: Diagnosis present

## 2020-08-19 DIAGNOSIS — Z87891 Personal history of nicotine dependence: Secondary | ICD-10-CM | POA: Insufficient documentation

## 2020-08-19 DIAGNOSIS — J00 Acute nasopharyngitis [common cold]: Secondary | ICD-10-CM | POA: Insufficient documentation

## 2020-08-19 LAB — SARS CORONAVIRUS 2 (TAT 6-24 HRS): SARS Coronavirus 2: NEGATIVE

## 2020-08-19 MED ORDER — FLUTICASONE PROPIONATE 50 MCG/ACT NA SUSP: 2.0000 | Freq: Every day | NASAL | 0 refills | Status: DC

## 2020-08-19 MED ORDER — CETIRIZINE HCL 10 MG PO TABS: 10.0000 mg | ORAL_TABLET | Freq: Every day | ORAL | 0 refills | Status: DC

## 2020-08-19 MED ORDER — PSEUDOEPHEDRINE HCL 30 MG PO TABS: 30.0000 mg | ORAL_TABLET | Freq: Three times a day (TID) | ORAL | 0 refills | Status: DC | PRN

## 2020-08-19 NOTE — ED Provider Notes (Signed)
[place]   MRN: [id number] DOB: 02/03/1966  Subjective:   Chelsea Branch is a 55 y.o. female presenting for 2-day history of acute onset bilateral ear pain, ear drainage bilaterally, runny nose, itchy eyes, throat pain.  Denies fever, cough, chest pain, shortness of breath abdominal pain, body aches.  Has not tried medications for relief.  She does have a history of heart disease, CHF.  Has kept her follow-up with her cardiologist, last echocardiogram showed an EF greater than 60% in 2020.  Has an appointment coming up in a month.  Has had her COVID vaccination, no booster.  No current facility-administered medications for this encounter.  Current Outpatient Medications:  .  albuterol (VENTOLIN HFA) 108 (90 Base) MCG/ACT inhaler, Inhale 1-2 puffs into the lungs every 6 (six) hours as needed for wheezing or shortness of breath., Disp: 18 g, Rfl: 2 .  aspirin 81 MG EC tablet, Take 1 tablet (81 mg total) by mouth daily., Disp: 30 tablet, Rfl: 0 .  cetirizine (ZYRTEC) 10 MG tablet, Take 1 tablet (10 mg total) by mouth daily., Disp: 30 tablet, Rfl: 0 .  diclofenac Sodium (VOLTAREN) 1 % GEL, Apply 4 g topically 4 (four) times daily as needed., Disp: 100 g, Rfl: 0 .  diphenhydrAMINE (BENADRYL) 25 MG tablet, Take 1 tablet (25 mg total) by mouth every 6 (six) hours., Disp: 20 tablet, Rfl: 0 .  HYDROcodone-acetaminophen (NORCO/VICODIN) 5-325 MG tablet, Take 1 tablet by mouth every 6 (six) hours as needed for severe pain., Disp: 10 tablet, Rfl: 0 .  ibuprofen (ADVIL) 800 MG tablet, Take 1 tablet (800 mg total) by mouth 3 (three) times daily., Disp: 21 tablet, Rfl: 1 .  magnesium oxide (MAG-OX) 400 MG tablet, TAKE 1 CAPSULE (400 MG TOTAL) BY MOUTH 2 (TWO) TIMES DAILY., Disp: 180 tablet, Rfl: 2 .  Menthol (CEPACOL SORE THROAT) 5.4 MG LOZG, Use as directed 1 lozenge (5.4 mg total) in the mouth or throat every 2 (two) hours as needed., Disp: 30 lozenge, Rfl: 0 .  metoprolol succinate  (TOPROL-XL) 50 MG 24 hr tablet, Take 1 tablet (50 mg total) by mouth daily., Disp: 90 tablet, Rfl: 3 .  Misc. Devices MISC, Please provide patient with insurance approved straight cane, Disp: 1 each, Rfl: 0 .  Misc. Devices MISC, Single-point cane with adjustable height x1, shower chair x1, transfer bench x1.  Diagnosis congestive heart failure, degenerative disc disease of the lumbar spine., Disp: 1 each, Rfl: 0 .  mupirocin ointment (BACTROBAN) 2 %, Apply 1 application topically 2 (two) times daily., Disp: 30 g, Rfl: 0 .  potassium chloride SA (KLOR-CON M20) 20 MEQ tablet, TAKE 1-2 TABS BY MOUTH AS DIRECTED. TAKE 2 TABLETS IN THE MORNING AND 1 TABLET IN THE EVENING, Disp: 270 tablet, Rfl: 3 .  promethazine-dextromethorphan (PROMETHAZINE-DM) 6.25-15 MG/5ML syrup, Take 5 mLs by mouth 4 (four) times daily as needed for cough., Disp: 118 mL, Rfl: 0 .  sacubitril-valsartan (ENTRESTO) 49-51 MG, Take 0.5 tablets by mouth 2 (two) times daily., Disp: 90 tablet, Rfl: 1 .  simvastatin (ZOCOR) 10 MG tablet, TAKE 1 TABLET BY MOUTH EVERY DAY IN THE EVENING, Disp: 90 tablet, Rfl: 1 .  spironolactone (ALDACTONE) 25 MG tablet, Take 0.5 tablets (12.5 mg total) by mouth daily., Disp: 90 tablet, Rfl: 1 .  tiZANidine (ZANAFLEX) 4 MG tablet, Take 1 tablet (4 mg total) by mouth every 8 (eight) hours as needed for muscle spasms. DO NOT DRINK ALCOHOL OR DRIVE WHILE  TAKING THIS MEDICATION, Disp: 30 tablet, Rfl: 0 .  torsemide (DEMADEX) 20 MG tablet, Take 1 tablet (20 mg total) by mouth daily., Disp: 90 tablet, Rfl: 3 .  traMADol (ULTRAM) 50 MG tablet, Take 50 mg by mouth 3 (three) times daily as needed., Disp: , Rfl:  .  triamcinolone cream (KENALOG) 0.1 %, Apply 1 application topically 2 (two) times daily., Disp: 45 g, Rfl: 0   Allergies  Allergen Reactions  . Ace Inhibitors Other (See Comments)    REACTION: Cough    Past Medical History:  Diagnosis Date  . Abnormal thyroid blood test 03/15/2017  . Abnormal uterine  bleeding   . Alopecia   . Anemia   . Angina pectoris with normal coronary arteriogram ([place]) 2017   Had + Troponin c/w ? NSTEMI due to A on C CHF  . 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 Chronic combined systolic and diastolic CHF (congestive heart failure) ([place])   . DYSLIPIDEMIA   . Essential hypertension   . Fibroids   . H/O noncompliance with medical treatment, presenting hazards to health   . Hypertension   . Hypokalemia 07/23/2016  . Menorrhagia   . 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 iniatially ?2/2 peripartum in 1994 - improved by 2008 then worsening EF in 2011 back down to EF 25-30%. b. echo 01/21/14 showed mod LVH, [name]; [name], [name], [name],   . 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 NSTEMI (non-ST elevated myocardial infarction) ([place]) 05/2015   Normal Coronaries.  . Peripartum cardiomyopathy 1994  . Sleep apnea 2015   CPAP 12/2013  . Stroke [facility]) 2011  . Systolic and diastolic CHF, acute on chronic ([place]) 06/14/2015  . Tobacco abuse      Past Surgical History:  Procedure Laterality Date  . CARDIAC CATHETERIZATION N/A 06/16/2015   Procedure: Left Heart Cath and Coronary Angiography;  Surgeon: Jettie Booze, MD;  Location: [facility];  Service: Cardiovascular;  Laterality: N/A;  . [place]  . LOOP RECORDER IMPLANT  ~ 2000  . TIBIAL TUBERCLERPLASTY    . TUBAL LIGATION  1994    Family History  Problem Relation Age of Onset  . Cancer Maternal Grandmother        uterine  . Hypertension Sister   . Healthy Mother   . Other Neg Hx   . Heart disease Neg Hx     Social History   Tobacco Use  . Smoking status: Former Smoker    Packs/day: 0.10    Years: 30.00    Pack years: 3.00    Types: Cigarettes    Quit date: 06/03/2015    Years since quitting: 5.2  . Smokeless tobacco: Never Used  . Tobacco comment: Pt. stated she stopped smoking a year ago. 09/29/2018  Vaping Use  .  Vaping Use: Never used  Substance Use Topics  . Alcohol use: No    Alcohol/week: 0.0 standard drinks  . Drug use: No    ROS   Objective:   Vitals: BP 125/81 (BP Location: Right Arm)   Pulse 83   Temp 97.8 F (36.6 C) (Oral)   Resp 18   SpO2 98%   Physical Exam Constitutional:      General: She is not in acute distress.    Appearance: She is well-developed. She is not ill-appearing, toxic-appearing or diaphoretic.  HENT:     Head: Normocephalic and atraumatic.  Right Ear: Ear canal normal. No drainage or tenderness. No middle ear effusion. Tympanic membrane is not erythematous.     Left Ear: Ear canal normal. No drainage or tenderness.  No middle ear effusion. Tympanic membrane is not erythematous.     Ears:     Comments: Mildly ceruminous ear canals patent.  TMs opacified bilaterally.    Nose: No congestion or rhinorrhea.     Mouth/Throat:     Mouth: Mucous membranes are moist. No oral lesions.     Pharynx: Oropharynx is clear. No pharyngeal swelling, oropharyngeal exudate, posterior oropharyngeal erythema or uvula swelling.     Tonsils: No tonsillar exudate or tonsillar abscesses.  Eyes:     Extraocular Movements:     Right eye: Normal extraocular motion.     Left eye: Normal extraocular motion.     Conjunctiva/sclera: Conjunctivae normal.     Pupils: Pupils are equal, round, and reactive to light.  Cardiovascular:     Rate and Rhythm: Normal rate.  Pulmonary:     Effort: Pulmonary effort is normal.  Musculoskeletal:     Cervical back: Normal range of motion and neck supple.  Lymphadenopathy:     Cervical: No cervical adenopathy.  Skin:    General: Skin is warm and dry.  Neurological:     General: No focal deficit present.     Mental Status: She is alert and oriented to person, place, and time.  Psychiatric:        Mood and Affect: Mood normal.        Behavior: Behavior normal.      Assessment and Plan :   PDMP not reviewed this encounter.  1.  Acute rhinitis   2. Acute ear pain, bilateral   3. Throat pain     Will manage for viral illness such as viral URI, viral syndrome, viral rhinitis, viral pharyngitis, COVID-19. Counseled patient on nature of COVID-19 including modes of transmission, diagnostic testing, management and supportive care.  Offered scripts for symptomatic relief. COVID 19 testing is pending. Counseled patient on potential for adverse effects with medications prescribed/recommended today, ER and return-to-clinic precautions discussed, patient verbalized understanding.     Jaynee Eagles, [place] 08/19/20 [phone]

## 2020-08-19 NOTE — ED Triage Notes (Signed)
Pt presents with bilateral ear pain/ drainage and sore throat xs 2 days

## 2020-08-19 NOTE — Discharge Instructions (Signed)

## 2020-09-11 ENCOUNTER — Encounter (HOSPITAL_COMMUNITY): Payer: Self-pay | Admitting: Emergency Medicine

## 2020-09-11 ENCOUNTER — Other Ambulatory Visit: Payer: Self-pay

## 2020-09-11 ENCOUNTER — Ambulatory Visit (HOSPITAL_COMMUNITY)
Admission: EM | Admit: 2020-09-11 | Discharge: 2020-09-11 | Disposition: A | Discharge status: Home / Self Care | Payer: Medicaid Other | Attending: Internal Medicine | Admitting: Internal Medicine

## 2020-09-11 DIAGNOSIS — A059 Bacterial foodborne intoxication, unspecified: Secondary | ICD-10-CM | POA: Diagnosis not present

## 2020-09-11 MED ORDER — ONDANSETRON 4 MG PO TBDP: 4.0000 mg | ORAL_TABLET | Freq: Three times a day (TID) | ORAL | 0 refills | Status: DC | PRN

## 2020-09-11 NOTE — Discharge Instructions (Addendum)
Increase oral fluid intake-please drink electrolyte balance fluids Return to urgent care if you have persistent diarrhea of over 1 week duration If you have worsening abdominal pain please return to urgent care to be reevaluated.

## 2020-09-11 NOTE — ED Provider Notes (Signed)
[facility]    CSN: [id number] Arrival date & time: 09/11/20  0907      History   Chief Complaint Chief Complaint  Patient presents with  . Diarrhea    HPI Chelsea Branch is a 55 y.o. female comes to urgent care with crampy abdominal pain, nausea and nonbloody nonmucoid diarrhea which started in the early hours of this morning.  Patient ate some [employer] chicken last night.  She denies eating salad.  6 hours also after she ate the salad she started experiencing above-mentioned symptoms.  She denies any vomiting but has persistent nausea.  She has had 3 watery nonbloody nonmucoid bowel movements.  No fever or chills.  No generalized body aches.  No sick contacts.  HPI  Past Medical History:  Diagnosis Date  . Abnormal thyroid blood test 03/15/2017  . Abnormal uterine bleeding   . Alopecia   . Anemia   . Angina pectoris with normal coronary arteriogram ([place]) 2017   Had + Troponin c/w ? NSTEMI due to A on C CHF  . 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 Chronic combined systolic and diastolic CHF (congestive heart failure) ([place])   . DYSLIPIDEMIA   . Essential hypertension   . Fibroids   . H/O noncompliance with medical treatment, presenting hazards to health   . Hypertension   . Hypokalemia 07/23/2016  . Menorrhagia   . 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 iniatially ?2/2 peripartum in 1994 - improved by 2008 then worsening EF in 2011 back down to EF 25-30%. b. echo 01/21/14 showed mod LVH, [name]; [name], [name], [name],   . 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 NSTEMI (non-ST elevated myocardial infarction) ([place]) 05/2015   Normal Coronaries.  . Peripartum cardiomyopathy 1994  . Sleep apnea 2015   CPAP 12/2013  . Stroke [facility]) 2011  . Systolic and diastolic CHF, acute on chronic ([place]) 06/14/2015  . Tobacco abuse     Patient Active Problem List   Diagnosis Date Noted  . Degenerative disc disease at L5-S1  level 10/10/2018  . Abnormal thyroid blood test 03/15/2017  . Hypokalemia 07/23/2016  . Low TSH level 04/26/2016  . Abnormal uterine bleeding 04/06/2016  . Nonspecific chest pain   . 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 NSTEMI (non-ST elevated myocardial infarction) ([place]) - with normal cornaries on Cath 06/14/2015  . Systolic and diastolic CHF, acute on chronic ([facility]) 06/14/2015  . H/O noncompliance with medical treatment, presenting hazards to health   . Low back pain 09/16/2014  . Homelessness 09/16/2014  . Vitamin D deficiency 01/29/2014  . OSA (obstructive sleep apnea) 01/08/2014  . Chronic combined systolic and diastolic CHF (congestive heart failure) ([place]) 08/14/2013  . Essential hypertension   . Fibroids 08/09/2012  . Menorrhagia 05/08/2012  . Female pattern hair loss   . HLD (hyperlipidemia) 06/08/2010  . CEREBRAL ANEURYSM 06/08/2010  . 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 CEREBROVASCULAR ACCIDENT, HX OF 06/08/2010    Past Surgical History:  Procedure Laterality Date  . CARDIAC CATHETERIZATION N/A 06/16/2015   Procedure: Left Heart Cath and Coronary Angiography;  Surgeon: Jettie Booze, MD;  Location: [facility];  Service: Cardiovascular;  Laterality: N/A;  . [place]  . LOOP RECORDER IMPLANT  ~ 2000  . TIBIAL TUBERCLERPLASTY    . TUBAL LIGATION  1994    OB History    Gravida  2   Para  2   Term  2   Preterm      AB      Living  3     SAB      IAB      Ectopic      Multiple  1   Live Births  3            Home Medications    Prior to Admission medications   Medication Sig Start Date End Date Taking? Authorizing Provider  aspirin 81 MG EC tablet Take 1 tablet (81 mg total) by mouth daily. 03/31/16  Yes Milagros Loll, MD  cetirizine (ZYRTEC) 10 MG tablet Take 1 tablet (10 mg total) by mouth daily. 08/19/20  Yes Jaynee Eagles, PA-C  diclofenac Sodium (VOLTAREN) 1 % GEL Apply 4 g topically  4 (four) times daily as needed. 07/23/20  Yes Volney American, PA-C  fluticasone [facility]) 50 MCG/ACT nasal spray Place 2 sprays into both nostrils daily. 08/19/20  Yes Jaynee Eagles, PA-C  magnesium oxide (MAG-OX) 400 MG tablet TAKE 1 CAPSULE (400 MG TOTAL) BY MOUTH 2 (TWO) TIMES DAILY. 01/22/20  Yes Deboraha Sprang, MD  Menthol (CEPACOL SORE THROAT) 5.4 MG LOZG Use as directed 1 lozenge (5.4 mg total) in the mouth or throat every 2 (two) hours as needed. 02/11/20  Yes Darr, Edison Nasuti, PA-C  metoprolol succinate (TOPROL-XL) 50 MG 24 hr tablet Take 1 tablet (50 mg total) by mouth daily. 05/02/20  Yes Deboraha Sprang, MD  ondansetron (ZOFRAN ODT) 4 MG disintegrating tablet Take 1 tablet (4 mg total) by mouth every 8 (eight) hours as needed for nausea or vomiting. 09/11/20  Yes Quinlee Sciarra, Myrene Galas, MD  pseudoephedrine (SUDAFED) 30 MG tablet Take 1 tablet (30 mg total) by mouth every 8 (eight) hours as needed for congestion. 08/19/20  Yes Jaynee Eagles, PA-C  sacubitril-valsartan (ENTRESTO) 49-51 MG Take 0.5 tablets by mouth 2 (two) times daily. 06/03/20  Yes Deboraha Sprang, MD  simvastatin (ZOCOR) 10 MG tablet TAKE 1 TABLET BY MOUTH EVERY DAY IN THE EVENING 04/14/20  Yes Deboraha Sprang, MD  spironolactone (ALDACTONE) 25 MG tablet Take 0.5 tablets (12.5 mg total) by mouth daily. 05/02/20  Yes Deboraha Sprang, MD  tiZANidine (ZANAFLEX) 4 MG tablet Take 1 tablet (4 mg total) by mouth every 8 (eight) hours as needed for muscle spasms. DO NOT DRINK ALCOHOL OR DRIVE WHILE TAKING THIS MEDICATION 07/23/20  Yes Volney American, PA-C  torsemide (DEMADEX) 20 MG tablet Take 1 tablet (20 mg total) by mouth daily. 03/05/20  Yes Weaver, Scott T, PA-C  albuterol (VENTOLIN HFA) 108 (90 Base) MCG/ACT inhaler Inhale 1-2 puffs into the lungs every 6 (six) hours as needed for wheezing or shortness of breath. 06/07/20   Vanessa Kick, MD  diphenhydrAMINE (BENADRYL) 25 MG tablet Take 1 tablet (25 mg total) by mouth every 6 (six)  hours. 09/13/19   Loura Halt A, NP  ibuprofen (ADVIL) 800 MG tablet Take 1 tablet (800 mg total) by mouth 3 (three) times daily. 06/24/20   Hazel Sams, PA-C  Misc. Devices MISC Please provide patient with insurance approved straight cane 09/29/18   Gildardo Pounds, NP  Misc. Devices MISC Single-point cane with adjustable height x1, shower chair x1, transfer bench x1.  Diagnosis congestive heart failure, degenerative disc disease of the lumbar spine. 11/23/18   Charlott Rakes, MD  mupirocin ointment (BACTROBAN) 2 % Apply 1 application topically 2 (two) times daily. 11/03/19  Wieters, Hallie C, PA-C  potassium chloride SA (KLOR-CON M20) 20 MEQ tablet TAKE 1-2 TABS BY MOUTH AS DIRECTED. TAKE 2 TABLETS IN THE MORNING AND 1 TABLET IN THE EVENING 05/09/20   Richardson Dopp T, PA-C  promethazine-dextromethorphan (PROMETHAZINE-DM) 6.25-15 MG/5ML syrup Take 5 mLs by mouth 4 (four) times daily as needed for cough. 06/07/20   Vanessa Kick, MD  triamcinolone cream (KENALOG) 0.1 % Apply 1 application topically 2 (two) times daily. 11/03/19   Wieters, Hallie C, PA-C  DULoxetine (CYMBALTA) 30 MG capsule Take 1 capsule (30 mg total) by mouth daily. 12/07/18 12/10/19  Charlott Rakes, MD  famotidine (PEPCID) 20 MG tablet Take 1 tablet (20 mg total) 2 (two) times daily by mouth. 04/10/17 11/03/19  Albesa Seen, PA-C    Family History Family History  Problem Relation Age of Onset  . Cancer Maternal Grandmother        uterine  . Hypertension Sister   . Healthy Mother   . Other Neg Hx   . Heart disease Neg Hx     Social History Social History   Tobacco Use  . Smoking status: Former Smoker    Packs/day: 0.10    Years: 30.00    Pack years: 3.00    Types: Cigarettes    Quit date: 06/03/2015    Years since quitting: 5.2  . Smokeless tobacco: Never Used  . Tobacco comment: Pt. stated she stopped smoking a year ago. 09/29/2018  Vaping Use  . Vaping Use: Never used  Substance Use Topics  . Alcohol use: No     Alcohol/week: 0.0 standard drinks  . Drug use: No     Allergies   Ace inhibitors   Review of Systems Review of Systems  Constitutional: Negative.   Eyes: Negative.   Respiratory: Negative.   Cardiovascular: Negative.   Gastrointestinal: Positive for abdominal pain, diarrhea and nausea. Negative for vomiting.  Genitourinary: Negative.   Musculoskeletal: Negative.   Neurological: Negative for dizziness, tremors, seizures, weakness, light-headedness and numbness.     Physical Exam Triage Vital Signs ED Triage Vitals  Enc Vitals Group     BP 09/11/20 0921 113/75     Pulse Rate 09/11/20 0921 66     Resp 09/11/20 0921 20     Temp 09/11/20 0921 97.8 F (36.6 C)     Temp Source 09/11/20 0921 Oral     SpO2 09/11/20 0921 95 %     Weight --      Height --      Head Circumference --      Peak Flow --      Pain Score 09/11/20 0916 8     Pain Loc --      Pain Edu? --      Excl. in [place]? --    No data found.  Updated Vital Signs BP 113/75 (BP Location: Left Arm) Comment (BP Location): large cuff  Pulse 66   Temp 97.8 F (36.6 C) (Oral)   Resp 20   SpO2 95%   Visual Acuity Right Eye Distance:   Left Eye Distance:   Bilateral Distance:    Right Eye Near:   Left Eye Near:    Bilateral Near:     Physical Exam Vitals and nursing note reviewed.  Constitutional:      General: She is not in acute distress.    Appearance: She is not ill-appearing.  Cardiovascular:     Rate and Rhythm: Normal rate and regular rhythm.  Pulses: Normal pulses.     Heart sounds: Normal heart sounds.  Pulmonary:     Effort: Pulmonary effort is normal.     Breath sounds: Normal breath sounds.  Abdominal:     General: Abdomen is flat. Bowel sounds are normal.     Palpations: Abdomen is soft.  Skin:    General: Skin is warm and dry.     Capillary Refill: Capillary refill takes less than 2 seconds.  Neurological:     General: No focal deficit present.     Mental Status: She is  alert and oriented to person, place, and time. Mental status is at baseline.      UC Treatments / Results  Labs (all labs ordered are listed, but only abnormal results are displayed) Labs Reviewed - No data to display  EKG   Radiology No results found.  Procedures Procedures (including critical care time)  Medications Ordered in UC Medications - No data to display  Initial Impression / Assessment and Plan / UC Course  I have reviewed the triage vital signs and the nursing notes.  Pertinent labs & imaging results that were available during my care of the patient were reviewed by me and considered in my medical decision making (see chart for details).     1.  Acute gastroenteritis likely food poisoning: Increase oral fluid intake Zofran as needed for nausea/vomiting Return to urgent care if symptoms worsen I anticipate that the symptoms will resolve over the next couple of days or a few days.  Final Clinical Impressions(s) / UC Diagnoses   Final diagnoses:  Food poisoning   Discharge Instructions   None    ED Prescriptions    Medication Sig Dispense Auth. Provider   ondansetron (ZOFRAN ODT) 4 MG disintegrating tablet Take 1 tablet (4 mg total) by mouth every 8 (eight) hours as needed for nausea or vomiting. 20 tablet Reola Buckles, Myrene Galas, MD     PDMP not reviewed this encounter.   Chase Picket, MD 09/11/20 1158

## 2020-09-11 NOTE — ED Triage Notes (Signed)
Started with diarrhea around 1:30 am this morning.  Patient has felt nauseated, but no vomiting.  Has had multiple diarrhea episodes.  Complains of abdominal pain.

## 2020-09-15 ENCOUNTER — Telehealth: Payer: Self-pay | Admitting: Internal Medicine

## 2020-09-15 DIAGNOSIS — M48061 Spinal stenosis, lumbar region without neurogenic claudication: Secondary | ICD-10-CM | POA: Diagnosis not present

## 2020-09-15 DIAGNOSIS — M5416 Radiculopathy, lumbar region: Secondary | ICD-10-CM | POA: Diagnosis not present

## 2020-09-15 NOTE — Telephone Encounter (Signed)
Returned call to patient. She asks if she should continue taking aspirin daily. I advised that I have thoroughly reviewed her chart and I cannot find a cardiac implication for ASA. She has a hx of stroke in 2011 and I advised that it was possibly the neurologist that started her on ASA. She does not see neurology regularly. I advised her that I will forward to Dr. Caryl Comes for his advice regarding ASA for her heart and that she should also reach out to Dr. Margarita Rana, her PCP. She verbalized understanding and thanked me for the call.

## 2020-09-15 NOTE — Telephone Encounter (Signed)
Patient called and wants to know if she needs to continue to take one aspirin a day.

## 2020-09-20 NOTE — Telephone Encounter (Signed)
M  thanks for reviewing the chart so carefully  I think for secondary prevention for CV ASA would be appropriate and its ok after doing all the work to affirm continuing it Thanks SK

## 2020-09-23 NOTE — Telephone Encounter (Signed)
Spoke with pt and advised per Dr Caryl Comes continue ASA 81mg  daily.  Pt verbalizes understanding and thanked Therapist, sports for the call.

## 2020-10-14 ENCOUNTER — Encounter (HOSPITAL_COMMUNITY): Payer: Self-pay | Admitting: Emergency Medicine

## 2020-10-14 ENCOUNTER — Ambulatory Visit (HOSPITAL_COMMUNITY)
Admission: EM | Admit: 2020-10-14 | Discharge: 2020-10-14 | Disposition: A | Discharge status: Home / Self Care | Payer: Medicaid Other | Attending: Family Medicine | Admitting: Family Medicine

## 2020-10-14 ENCOUNTER — Other Ambulatory Visit: Payer: Self-pay

## 2020-10-14 DIAGNOSIS — Z87891 Personal history of nicotine dependence: Secondary | ICD-10-CM | POA: Insufficient documentation

## 2020-10-14 DIAGNOSIS — Z79899 Other long term (current) drug therapy: Secondary | ICD-10-CM | POA: Diagnosis not present

## 2020-10-14 DIAGNOSIS — J069 Acute upper respiratory infection, unspecified: Secondary | ICD-10-CM | POA: Diagnosis not present

## 2020-10-14 DIAGNOSIS — U071 COVID-19: Secondary | ICD-10-CM | POA: Insufficient documentation

## 2020-10-14 LAB — SARS CORONAVIRUS 2 (TAT 6-24 HRS): SARS Coronavirus 2: POSITIVE — AB

## 2020-10-14 MED ORDER — ALBUTEROL SULFATE HFA 108 (90 BASE) MCG/ACT IN AERS
2.0000 | INHALATION_SPRAY | Freq: Once | RESPIRATORY_TRACT | Status: AC
  Administered 2020-10-14: 2 via RESPIRATORY_TRACT

## 2020-10-14 MED ORDER — ALBUTEROL SULFATE HFA 108 (90 BASE) MCG/ACT IN AERS
INHALATION_SPRAY | RESPIRATORY_TRACT | Status: AC
  Filled 2020-10-14: qty 6.7

## 2020-10-14 MED ORDER — GUAIFENESIN ER 600 MG PO TB12: 600.0000 mg | ORAL_TABLET | Freq: Two times a day (BID) | ORAL | 0 refills | Status: DC | PRN

## 2020-10-14 MED ORDER — PROMETHAZINE-DM 6.25-15 MG/5ML PO SYRP: 5.0000 mL | ORAL_SOLUTION | Freq: Four times a day (QID) | ORAL | 0 refills | Status: DC | PRN

## 2020-10-14 MED ORDER — ACETAMINOPHEN 500 MG PO TABS: 500.0000 mg | ORAL_TABLET | Freq: Four times a day (QID) | ORAL | 0 refills | Status: DC | PRN

## 2020-10-14 NOTE — ED Triage Notes (Signed)
Pt presents with sore throat, cough, body aches, and congestiong that started yesterday. Pt states has taken tylenol and allergy medication.

## 2020-10-14 NOTE — ED Provider Notes (Signed)
[place]    CSN: [id number] Arrival date & time: 10/14/20  2440      History   Chief Complaint Chief Complaint  Patient presents with  . Cough  . Generalized Body Aches  . Sore Throat  . Nasal Congestion    HPI Chelsea Branch is a 55 y.o. female.   Here today with 2-day history of sore throat, cough, body aches, congestion, fatigue.  She states symptoms have been worsening since onset.  Denies fever, chest pain, shortness of breath, abdominal pain, nausea vomiting diarrhea.  Boyfriend sick with a cold, has not tested for COVID or flu.  History of seasonal allergies for which she takes antihistamines daily.  She also keeps an albuterol inhaler on hand for coughing but states she is out and wants a refill.     Past Medical History:  Diagnosis Date  . Abnormal thyroid blood test 03/15/2017  . Abnormal uterine bleeding   . Alopecia   . Anemia   . Angina pectoris with normal coronary arteriogram ([place]) 2017   Had + Troponin c/w ? NSTEMI due to A on C CHF  . 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 Chronic combined systolic and diastolic CHF (congestive heart failure) ([place])   . DYSLIPIDEMIA   . Essential hypertension   . Fibroids   . H/O noncompliance with medical treatment, presenting hazards to health   . Hypertension   . Hypokalemia 07/23/2016  . Menorrhagia   . 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 iniatially ?2/2 peripartum in 1994 - improved by 2008 then worsening EF in 2011 back down to EF 25-30%. b. echo 01/21/14 showed mod LVH, [name]; [name], [name], [name],   . 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 NSTEMI (non-ST elevated myocardial infarction) ([place]) 05/2015   Normal Coronaries.  . Peripartum cardiomyopathy 1994  . Sleep apnea 2015   CPAP 12/2013  . Stroke [facility]) 2011  . Systolic and diastolic CHF, acute on chronic ([place]) 06/14/2015  . Tobacco abuse     Patient Active Problem List   Diagnosis Date  Noted  . Degenerative disc disease at L5-S1 level 10/10/2018  . Abnormal thyroid blood test 03/15/2017  . Hypokalemia 07/23/2016  . Low TSH level 04/26/2016  . Abnormal uterine bleeding 04/06/2016  . Nonspecific chest pain   . 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 NSTEMI (non-ST elevated myocardial infarction) ([place]) - with normal cornaries on Cath 06/14/2015  . Systolic and diastolic CHF, acute on chronic ([place]) 06/14/2015  . H/O noncompliance with medical treatment, presenting hazards to health   . Low back pain 09/16/2014  . Homelessness 09/16/2014  . Vitamin D deficiency 01/29/2014  . OSA (obstructive sleep apnea) 01/08/2014  . Chronic combined systolic and diastolic CHF (congestive heart failure) ([place]) 08/14/2013  . Essential hypertension   . Fibroids 08/09/2012  . Menorrhagia 05/08/2012  . Female pattern hair loss   . HLD (hyperlipidemia) 06/08/2010  . CEREBRAL ANEURYSM 06/08/2010  . 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 CEREBROVASCULAR ACCIDENT, HX OF 06/08/2010    Past Surgical History:  Procedure Laterality Date  . CARDIAC CATHETERIZATION N/A 06/16/2015   Procedure: Left Heart Cath and Coronary Angiography;  Surgeon: Jettie Booze, MD;  Location: [place] [facility];  Service: Cardiovascular;  Laterality: N/A;  . [place]  . LOOP RECORDER IMPLANT  ~ 2000  . TIBIAL TUBERCLERPLASTY    . [place]  OB History    Gravida  2   Para  2   Term  2   Preterm      AB      Living  3     SAB      IAB      Ectopic      Multiple  1   Live Births  3            Home Medications    Prior to Admission medications   Medication Sig Start Date End Date Taking? Authorizing Provider  acetaminophen (TYLENOL) 500 MG tablet Take 1-2 tablets (500-1,000 mg total) by mouth every 6 (six) hours as needed. 10/14/20  Yes Volney American, PA-C  guaiFENesin (MUCINEX) 600 MG 12 hr tablet Take 1 tablet (600 mg  total) by mouth 2 (two) times daily as needed. 10/14/20  Yes Volney American, PA-C  promethazine-dextromethorphan (PROMETHAZINE-DM) 6.25-15 MG/5ML syrup Take 5 mLs by mouth 4 (four) times daily as needed for cough. 10/14/20  Yes Volney American, PA-C  albuterol (VENTOLIN HFA) 108 (90 Base) MCG/ACT inhaler Inhale 1-2 puffs into the lungs every 6 (six) hours as needed for wheezing or shortness of breath. 06/07/20   Vanessa Kick, MD  aspirin 81 MG EC tablet Take 1 tablet (81 mg total) by mouth daily. 03/31/16   Milagros Loll, MD  cetirizine (ZYRTEC) 10 MG tablet Take 1 tablet (10 mg total) by mouth daily. 08/19/20   Jaynee Eagles, PA-C  diclofenac Sodium (VOLTAREN) 1 % GEL Apply 4 g topically 4 (four) times daily as needed. 07/23/20   Volney American, PA-C  diphenhydrAMINE (BENADRYL) 25 MG tablet Take 1 tablet (25 mg total) by mouth every 6 (six) hours. 09/13/19   Loura Halt A, NP  fluticasone (FLONASE) 50 MCG/ACT nasal spray Place 2 sprays into both nostrils daily. 08/19/20   Jaynee Eagles, PA-C  ibuprofen (ADVIL) 800 MG tablet Take 1 tablet (800 mg total) by mouth 3 (three) times daily. 06/24/20   Hazel Sams, PA-C  magnesium oxide (MAG-OX) 400 MG tablet TAKE 1 CAPSULE (400 MG TOTAL) BY MOUTH 2 (TWO) TIMES DAILY. 01/22/20   Deboraha Sprang, MD  Menthol (CEPACOL SORE THROAT) 5.4 MG LOZG Use as directed 1 lozenge (5.4 mg total) in the mouth or throat every 2 (two) hours as needed. 02/11/20   Darr, Edison Nasuti, PA-C  metoprolol succinate (TOPROL-XL) 50 MG 24 hr tablet Take 1 tablet (50 mg total) by mouth daily. 05/02/20   Deboraha Sprang, MD  Misc. Devices MISC Please provide patient with insurance approved straight cane 09/29/18   Gildardo Pounds, NP  Misc. Devices MISC Single-point cane with adjustable height x1, shower chair x1, transfer bench x1.  Diagnosis congestive heart failure, degenerative disc disease of the lumbar spine. 11/23/18   Charlott Rakes, MD  mupirocin ointment (BACTROBAN) 2 %  Apply 1 application topically 2 (two) times daily. 11/03/19   Wieters, Hallie C, PA-C  ondansetron (ZOFRAN ODT) 4 MG disintegrating tablet Take 1 tablet (4 mg total) by mouth every 8 (eight) hours as needed for nausea or vomiting. 09/11/20   Lamptey, Myrene Galas, MD  potassium chloride SA (KLOR-CON M20) 20 MEQ tablet TAKE 1-2 TABS BY MOUTH AS DIRECTED. TAKE 2 TABLETS IN THE MORNING AND 1 TABLET IN THE EVENING 05/09/20   Richardson Dopp T, PA-C  promethazine-dextromethorphan (PROMETHAZINE-DM) 6.25-15 MG/5ML syrup Take 5 mLs by mouth 4 (four) times daily as needed for cough. 06/07/20   Hagler,  Aaron Edelman, MD  pseudoephedrine (SUDAFED) 30 MG tablet Take 1 tablet (30 mg total) by mouth every 8 (eight) hours as needed for congestion. 08/19/20   Jaynee Eagles, PA-C  sacubitril-valsartan (ENTRESTO) 49-51 MG Take 0.5 tablets by mouth 2 (two) times daily. 06/03/20   Deboraha Sprang, MD  simvastatin (ZOCOR) 10 MG tablet TAKE 1 TABLET BY MOUTH EVERY DAY IN THE EVENING 04/14/20   Deboraha Sprang, MD  spironolactone (ALDACTONE) 25 MG tablet Take 0.5 tablets (12.5 mg total) by mouth daily. 05/02/20   Deboraha Sprang, MD  tiZANidine (ZANAFLEX) 4 MG tablet Take 1 tablet (4 mg total) by mouth every 8 (eight) hours as needed for muscle spasms. DO NOT DRINK ALCOHOL OR DRIVE WHILE TAKING THIS MEDICATION 07/23/20   Volney American, PA-C  torsemide (DEMADEX) 20 MG tablet Take 1 tablet (20 mg total) by mouth daily. 03/05/20   Richardson Dopp T, PA-C  triamcinolone cream (KENALOG) 0.1 % Apply 1 application topically 2 (two) times daily. 11/03/19   Wieters, Hallie C, PA-C  DULoxetine (CYMBALTA) 30 MG capsule Take 1 capsule (30 mg total) by mouth daily. 12/07/18 12/10/19  Charlott Rakes, MD  famotidine (PEPCID) 20 MG tablet Take 1 tablet (20 mg total) 2 (two) times daily by mouth. 04/10/17 11/03/19  Albesa Seen, PA-C    Family History Family History  Problem Relation Age of Onset  . Cancer Maternal Grandmother        uterine  .  Hypertension Sister   . Healthy Mother   . Other Neg Hx   . Heart disease Neg Hx     Social History Social History   Tobacco Use  . Smoking status: Former Smoker    Packs/day: 0.10    Years: 30.00    Pack years: 3.00    Types: Cigarettes    Quit date: 06/03/2015    Years since quitting: 5.3  . Smokeless tobacco: Never Used  . Tobacco comment: Pt. stated she stopped smoking a year ago. 09/29/2018  Vaping Use  . Vaping Use: Never used  Substance Use Topics  . Alcohol use: No    Alcohol/week: 0.0 standard drinks  . Drug use: No     Allergies   Ace inhibitors   Review of Systems Review of Systems Per HPI  Physical Exam Triage Vital Signs ED Triage Vitals  Enc Vitals Group     BP 10/14/20 1129 107/75     Pulse Rate 10/14/20 1129 85     Resp 10/14/20 1129 17     Temp 10/14/20 1129 98.9 F (37.2 C)     Temp Source 10/14/20 1129 Oral     SpO2 10/14/20 1129 97 %     Weight --      Height --      Head Circumference --      Peak Flow --      Pain Score 10/14/20 1127 9     Pain Loc --      Pain Edu? --      Excl. in [place]? --    No data found.  Updated Vital Signs BP 107/75 (BP Location: Right Arm)   Pulse 85   Temp 98.9 F (37.2 C) (Oral)   Resp 17   SpO2 97%   Visual Acuity Right Eye Distance:   Left Eye Distance:   Bilateral Distance:    Right Eye Near:   Left Eye Near:    Bilateral Near:     Physical Exam Vitals and  nursing note reviewed.  Constitutional:      Appearance: Normal appearance. She is not ill-appearing.  HENT:     Head: Atraumatic.     Nose: Rhinorrhea present.     Mouth/Throat:     Mouth: Mucous membranes are moist.     Pharynx: Oropharynx is clear. Posterior oropharyngeal erythema present. No oropharyngeal exudate.  Eyes:     Extraocular Movements: Extraocular movements intact.     Conjunctiva/sclera: Conjunctivae normal.  Cardiovascular:     Rate and Rhythm: Normal rate and regular rhythm.     Heart sounds: Normal heart  sounds.  Pulmonary:     Effort: Pulmonary effort is normal. No respiratory distress.     Breath sounds: Normal breath sounds. No wheezing or rales.  Abdominal:     General: Bowel sounds are normal. There is no distension.     Palpations: Abdomen is soft.     Tenderness: There is no abdominal tenderness. There is no right CVA tenderness, left CVA tenderness or guarding.  Musculoskeletal:        General: Normal range of motion.     Cervical back: Normal range of motion and neck supple.  Skin:    General: Skin is warm and dry.  Neurological:     Mental Status: She is alert and oriented to person, place, and time.  Psychiatric:        Mood and Affect: Mood normal.        Thought Content: Thought content normal.        Judgment: Judgment normal.    UC Treatments / Results  Labs (all labs ordered are listed, but only abnormal results are displayed) Labs Reviewed  SARS CORONAVIRUS 2 (TAT 6-24 HRS)   EKG  Radiology No results found.  Procedures Procedures (including critical care time)  Medications Ordered in UC Medications  albuterol (VENTOLIN HFA) 108 (90 Base) MCG/ACT inhaler 2 puff (2 puffs Inhalation Given 10/14/20 1206)   Initial Impression / Assessment and Plan / UC Course  I have reviewed the triage vital signs and the nursing notes.  Pertinent labs & imaging results that were available during my care of the patient were reviewed by me and considered in my medical decision making (see chart for details).     Vitals and exam reassuring, consistent with viral URI.  COVID PCR pending, work note given with isolation protocol instructions.  Albuterol inhaler given in clinic, prescription for Mucinex, Tylenol, Phenergan DM given for symptomatic management.  Supportive home care and strict return precautions reviewed.  Final Clinical Impressions(s) / UC Diagnoses   Final diagnoses:  Viral URI with cough   Discharge Instructions   None    ED Prescriptions     Medication Sig Dispense Auth. Provider   acetaminophen (TYLENOL) 500 MG tablet Take 1-2 tablets (500-1,000 mg total) by mouth every 6 (six) hours as needed. 60 tablet Merrie Roof [place], [place]   promethazine-dextromethorphan (PROMETHAZINE-DM) 6.25-15 MG/5ML syrup Take 5 mLs by mouth 4 (four) times daily as needed for cough. 100 mL Volney American, PA-C   guaiFENesin (MUCINEX) 600 MG 12 hr tablet Take 1 tablet (600 mg total) by mouth 2 (two) times daily as needed. 30 tablet Volney American, [place]     PDMP not reviewed this encounter.   Volney American, [place] 10/14/20 1213

## 2020-10-15 ENCOUNTER — Telehealth: Payer: Self-pay | Admitting: Unknown Physician Specialty

## 2020-10-15 NOTE — Telephone Encounter (Signed)
Called to discuss with patient about COVID-19 symptoms and the use of one of the available treatments for those with mild to moderate Covid symptoms and at a high risk of hospitalization.  Pt appears to qualify for outpatient treatment due to co-morbid conditions and/or a member of an at-risk group in accordance with the FDA Emergency Use Authorization.    Symptom onset: 5/23 Vaccinated: ? Booster?  Unable to reach pt - [place]

## 2020-10-16 ENCOUNTER — Other Ambulatory Visit: Payer: Self-pay

## 2020-10-16 ENCOUNTER — Ambulatory Visit: Payer: Medicaid Other | Attending: Physician Assistant | Admitting: Physician Assistant

## 2020-12-09 ENCOUNTER — Telehealth: Payer: Self-pay

## 2020-12-09 MED ORDER — SACUBITRIL-VALSARTAN 24-26 MG PO TABS: 1.0000 | ORAL_TABLET | Freq: Two times a day (BID) | ORAL | 0 refills | Status: DC

## 2020-12-09 NOTE — Telephone Encounter (Signed)
Pt is calling because she has been without her medication for 2 days and she is having symptoms of extreme fatigue and doesn't knoe want to do

## 2020-12-09 NOTE — Telephone Encounter (Signed)
**Note De-Identified Marcial Pless Obfuscation** Per the pts chart It is time to do a Echo PA for the pt so I have completed a [place] [employer] Division of Medical Asst. [name] PA form and printed the pts latest Echo results that shows proof of improvement since the pt stated taking Entresto. I have emailed all to Dr Aquilla Hacker nurse so she can obtain his signature, date it, and to fax all to [place] at the fax number written on cover letter included or to place in the to be faxed box in Medical Records to be faxed.

## 2020-12-09 NOTE — Telephone Encounter (Signed)
**Note De-Identified [name]** We received an Entresto 49-51 mg prior auth request. The request is for #90 but Chelsea Branch is taken BID so I checked her med lst and noticed that the [name] [facility] with instructions to take 1/2 tablet of a 49-51 mg tablet BID. Because it is not recommended to split Entresto tablets in half and Chelsea Branch comes in a 24-26 mg tablet, I have changed the pts Entresto 49-51 mg tablet to 24-26 mg with instructions to take a whole tablet BID and [facility].

## 2020-12-10 NOTE — Telephone Encounter (Signed)
Attempted phone call x 2.  Message says unable to complete phone call as dialed.  Phone number confined again in [place] as dialed number.

## 2020-12-16 ENCOUNTER — Telehealth: Payer: Self-pay | Admitting: Internal Medicine

## 2020-12-16 NOTE — Telephone Encounter (Signed)
Follow Up:     Patient said she had called about 1 week ago saying  she need a prior authorization for her Entresto.Marland Kitchen She would like to know the status of this please.

## 2020-12-16 NOTE — Telephone Encounter (Signed)
Pt calling to check on her prior auth. Will send request to Dr. Olin Pia RN since prior auth was in need of a signature. In the meantime, samples of 24/26 Entresto left at the front desk for pt.

## 2020-12-17 NOTE — Telephone Encounter (Addendum)
**Note De-Identified Drewey Begue Obfuscation** I called [employer] and s/w Alveta Heimlich but call was lost when he transferred my call to their pharmacy review dept. I called back and s/w [place] who advised me that the pt has a different paln now through [facility] and advised that her [name] PA would have to be done through them.  [facility] and was provided the following information from the pts Ins card: ID: [id number] BIN: [id number] PCN: [id number] GRP: [id number]  I started a urgent Entresto PA through covermymeds. Key: [employer]

## 2020-12-17 NOTE — Telephone Encounter (Signed)
Pt's PA was completed and faxed on 12/10/2020 with ok result.

## 2020-12-17 NOTE — Telephone Encounter (Signed)
**Note De-Identified Chelsea Branch Obfuscation** See phone note in he pts chart from 7/19 for updates.

## 2020-12-19 NOTE — Telephone Encounter (Signed)
**Note De-Identified Nil Xiong Obfuscation** Ashley Akin Key: [facility] - PA Case ID: [id number] Outcome: Approved Request Reference Number: [id number].  ENTRESTO TAB 24-'26MG'$  is approved through 12/17/2021. For further questions, call [employer] at [phone]. Drug: [name] 24-'26MG'$  tablets Form [employer] Electronic Prior Authorization Form (2017 NCPDP)  I have notified [facility] of this approval.

## 2020-12-22 ENCOUNTER — Other Ambulatory Visit: Payer: Self-pay

## 2020-12-24 ENCOUNTER — Telehealth: Payer: Self-pay | Admitting: *Deleted

## 2020-12-24 NOTE — Telephone Encounter (Signed)
[facility] Met[facility]rolol Succ ER 50 mg tablets. Sig: take 1 & 1/2 tablets everyday. Per last office note patient instructed to take 1 tablet daily. Please advise. Thank you

## 2020-12-26 MED ORDER — METOPROLOL SUCCINATE ER 50 MG PO TB24: 50.0000 mg | ORAL_TABLET | Freq: Every day | ORAL | 1 refills | Status: DC

## 2020-12-26 NOTE — Telephone Encounter (Signed)
Metoprolol Succinate '50mg'$  - 1 tablet by mouth daily sent to pharmacy on file.  #90 with 1 RF.

## 2020-12-26 NOTE — Addendum Note (Signed)
Addended by: Thora Lance on: 12/26/2020 01:20 PM   Modules accepted: Orders

## 2021-01-01 NOTE — Progress Notes (Deleted)
PCP:  Charlott Rakes, MD Primary Cardiologist: Virl Axe, MD Electrophysiologist: Virl Axe, MD   Chelsea Branch is a 55 y.o. female seen today for Virl Axe, MD for routine electrophysiology followup.  Since last being seen in our clinic the patient reports doing ***.  she denies chest pain, palpitations, dyspnea, PND, orthopnea, nausea, vomiting, dizziness, syncope, edema, weight gain, or early satiety.  Past Medical History:  Diagnosis Date   Abnormal thyroid blood test 03/15/2017   Abnormal uterine bleeding    Alopecia    Anemia    Angina pectoris with normal coronary arteriogram ([place]) 2017   Had + Troponin c/w ? 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 iniatially ?2/2 peripartum in 1994 - improved by 2008 then worsening EF in 2011 back down to EF 25-30%. b. echo 01/21/14 showed mod LVH, [name]; 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   Peripartum cardiomyopathy 1994   Sleep apnea 2015   CPAP 12/2013   Stroke ([place]) [id number]   Systolic and diastolic CHF, acute on chronic ([place]) 06/14/2015   Tobacco abuse    Past Surgical History:  Procedure Laterality Date   CARDIAC CATHETERIZATION N/A 06/16/2015   Procedure: Left Heart Cath and Coronary Angiography;  Surgeon: Jettie Booze, MD;  Location: [facility];  Service: Cardiovascular;  Laterality: N/A;   CESAREAN SECTION  1992  1994   LOOP RECORDER IMPLANT  ~ [place]     TUBAL LIGATION  1994    Current  Outpatient Medications  Medication Sig Dispense Refill   acetaminophen (TYLENOL) 500 MG tablet Take 1-2 tablets (500-1,000 mg total) by mouth every 6 (six) hours as needed. 60 tablet 0   albuterol (VENTOLIN HFA) 108 (90 Base) MCG/ACT inhaler Inhale 1-2 puffs into the lungs every 6 (six) hours as needed for wheezing or shortness of breath. 18 g 2   aspirin 81 MG EC tablet Take 1 tablet (81 mg total) by mouth daily. 30 tablet 0   cetirizine (ZYRTEC) 10 MG tablet Take 1 tablet (10 mg total) by mouth daily. 90 tablet 0   diclofenac Sodium (VOLTAREN) 1 % GEL Apply 4 g topically 4 (four) times daily as needed. 100 g 0   diphenhydrAMINE (BENADRYL) 25 MG tablet Take 1 tablet (25 mg total) by mouth every 6 (six) hours. 20 tablet 0   fluticasone (FLONASE) 50 MCG/ACT nasal spray Place 2 sprays into both nostrils daily. 16 g 0   guaiFENesin (MUCINEX) 600 MG 12 hr tablet Take 1 tablet (600 mg total) by mouth 2 (two) times daily as needed. 30 tablet 0   ibuprofen (ADVIL) 800 MG tablet Take 1 tablet (800 mg total) by mouth 3 (three) times daily. 21 tablet 1   magnesium oxide (MAG-OX) 400 MG tablet TAKE 1 CAPSULE (400 MG TOTAL) BY MOUTH 2 (TWO) TIMES DAILY. 180 tablet 2   Menthol (CEPACOL SORE THROAT) 5.4 MG LOZG Use as directed 1 lozenge (5.4 mg  total) in the mouth or throat every 2 (two) hours as needed. 30 lozenge 0   metoprolol succinate (TOPROL-XL) 50 MG 24 hr tablet Take 1 tablet (50 mg total) by mouth daily. 90 tablet 1   Misc. Devices MISC Please provide patient with insurance approved straight cane 1 each 0   Misc. Devices MISC Single-point cane with adjustable height x1, shower chair x1, transfer bench x1.  Diagnosis congestive heart failure, degenerative disc disease of the lumbar spine. 1 each 0   mupirocin ointment (BACTROBAN) 2 % Apply 1 application topically 2 (two) times daily. 30 g 0   ondansetron (ZOFRAN ODT) 4 MG disintegrating tablet Take 1 tablet (4 mg total) by mouth every 8 (eight) hours as  needed for nausea or vomiting. 20 tablet 0   potassium chloride SA (KLOR-CON M20) 20 MEQ tablet TAKE 1-2 TABS BY MOUTH AS DIRECTED. TAKE 2 TABLETS IN THE MORNING AND 1 TABLET IN THE EVENING 270 tablet 3   promethazine-dextromethorphan (PROMETHAZINE-DM) 6.25-15 MG/5ML syrup Take 5 mLs by mouth 4 (four) times daily as needed for cough. 118 mL 0   promethazine-dextromethorphan (PROMETHAZINE-DM) 6.25-15 MG/5ML syrup Take 5 mLs by mouth 4 (four) times daily as needed for cough. 100 mL 0   pseudoephedrine (SUDAFED) 30 MG tablet Take 1 tablet (30 mg total) by mouth every 8 (eight) hours as needed for congestion. 30 tablet 0   sacubitril-valsartan (ENTRESTO) 24-26 MG Take 1 tablet by mouth 2 (two) times daily. >>SEE DIRECTIONS AS YOUR DOSE HAS CHANGED<< 180 tablet 0   simvastatin (ZOCOR) 10 MG tablet TAKE 1 TABLET BY MOUTH EVERY DAY IN THE EVENING 90 tablet 1   spironolactone (ALDACTONE) 25 MG tablet Take 0.5 tablets (12.5 mg total) by mouth daily. 90 tablet 1   tiZANidine (ZANAFLEX) 4 MG tablet Take 1 tablet (4 mg total) by mouth every 8 (eight) hours as needed for muscle spasms. DO NOT DRINK ALCOHOL OR DRIVE WHILE TAKING THIS MEDICATION 30 tablet 0   torsemide (DEMADEX) 20 MG tablet Take 1 tablet (20 mg total) by mouth daily. 90 tablet 3   triamcinolone cream (KENALOG) 0.1 % Apply 1 application topically 2 (two) times daily. 45 g 0   No current facility-administered medications for this visit.    Allergies  Allergen Reactions   Ace Inhibitors Other (See Comments)    REACTION: Cough    Social History   Socioeconomic History   Marital status: Single    Spouse name: Not on file   Number of children: 3   Years of education: 62   Highest education level: Not on file  Occupational History   Occupation: unemployed  Tobacco Use   Smoking status: Former    Packs/day: 0.10    Years: 30.00    Pack years: 3.00    Types: Cigarettes    Quit date: 06/03/2015    Years since quitting: 5.5   Smokeless  tobacco: Never   Tobacco comments:    Pt. stated she stopped smoking a year ago. 09/29/2018  Vaping Use   Vaping Use: Never used  Substance and Sexual Activity   Alcohol use: No    Alcohol/week: 0.0 standard drinks   Drug use: No   Sexual activity: Yes    Birth control/protection: I.U.D.    Comment: fibroids  Other Topics Concern   Not on file  Social History Narrative   Lives at home with 56 yo twins ([place] and [place])   62 yo son lives outside the home  3 yo granddaughter    Social Determinants of Radio broadcast assistant Strain: Not on file  Food Insecurity: Not on file  Transportation Needs: Not on file  Physical Activity: Not on file  Stress: Not on file  Social Connections: Not on file  Intimate Partner Violence: Not on file     Review of Systems: General: No chills, fever, night sweats or weight changes  Cardiovascular:  No chest pain, dyspnea on exertion, edema, orthopnea, palpitations, paroxysmal nocturnal dyspnea Dermatological: No rash, lesions or masses Respiratory: No cough, dyspnea Urologic: No hematuria, dysuria Abdominal: No nausea, vomiting, diarrhea, bright red blood per rectum, melena, or hematemesis Neurologic: No visual changes, weakness, changes in mental status All other systems reviewed and are otherwise negative except as noted above.  Physical Exam: There were no vitals filed for this visit.  GEN- The patient is well appearing, alert and oriented x 3 today.   HEENT: normocephalic, atraumatic; sclera clear, conjunctiva pink; hearing intact; oropharynx clear; neck supple, no JVP Lymph- no cervical lymphadenopathy Lungs- Clear to ausculation bilaterally, normal work of breathing.  No wheezes, rales, rhonchi Heart- Regular rate and rhythm, no murmurs, rubs or gallops, PMI not laterally displaced GI- soft, non-tender, non-distended, bowel sounds present, no hepatosplenomegaly Extremities- no clubbing, cyanosis, or edema; DP/PT/radial pulses  2+ bilaterally MS- no significant deformity or atrophy Skin- warm and dry, no rash or lesion Psych- euthymic mood, full affect Neuro- strength and sensation are intact  EKG is ordered. Personal review of EKG from today shows ***  Additional studies reviewed include: Previous EP office notes.   Assessment and Plan:  Lightheadedness    Nonischemic cardiomyopathy resolved   Congestive heart failure chronic   Hypertension-resolved   Obstructive sleep apnea AHI 18   Update Echo  Taking torsemide 20 mg daily ***  *** CPAP  Shirley Friar, [place]  01/01/21 1:49 PM

## 2021-01-02 ENCOUNTER — Ambulatory Visit: Payer: Medicaid Other | Admitting: Student

## 2021-01-02 DIAGNOSIS — I5042 Chronic combined systolic (congestive) and diastolic (congestive) heart failure: Secondary | ICD-10-CM

## 2021-01-02 DIAGNOSIS — I10 Essential (primary) hypertension: Secondary | ICD-10-CM

## 2021-01-02 DIAGNOSIS — I420 Dilated cardiomyopathy: Secondary | ICD-10-CM

## 2021-01-05 ENCOUNTER — Ambulatory Visit (HOSPITAL_COMMUNITY)
Admission: EM | Admit: 2021-01-05 | Discharge: 2021-01-05 | Disposition: A | Discharge status: Home / Self Care | Payer: Medicaid Other | Attending: Emergency Medicine | Admitting: Emergency Medicine

## 2021-01-05 ENCOUNTER — Encounter (HOSPITAL_COMMUNITY): Payer: Self-pay | Admitting: Emergency Medicine

## 2021-01-05 ENCOUNTER — Other Ambulatory Visit: Payer: Self-pay

## 2021-01-05 DIAGNOSIS — Z20822 Contact with and (suspected) exposure to covid-19: Secondary | ICD-10-CM | POA: Diagnosis not present

## 2021-01-05 DIAGNOSIS — R609 Edema, unspecified: Secondary | ICD-10-CM | POA: Insufficient documentation

## 2021-01-05 DIAGNOSIS — M545 Low back pain, unspecified: Secondary | ICD-10-CM | POA: Diagnosis not present

## 2021-01-05 DIAGNOSIS — R21 Rash and other nonspecific skin eruption: Secondary | ICD-10-CM | POA: Insufficient documentation

## 2021-01-05 LAB — SARS CORONAVIRUS 2 (TAT 6-24 HRS): SARS Coronavirus 2: NEGATIVE

## 2021-01-05 MED ORDER — ACETAMINOPHEN 500 MG PO TABS: ORAL_TABLET | ORAL | 0 refills | Status: DC

## 2021-01-05 MED ORDER — DICLOFENAC SODIUM 1 % EX GEL: 4.0000 g | Freq: Four times a day (QID) | CUTANEOUS | 0 refills | Status: DC

## 2021-01-05 MED ORDER — TRIAMCINOLONE ACETONIDE 0.1 % EX CREA: 1.0000 "application " | TOPICAL_CREAM | Freq: Two times a day (BID) | CUTANEOUS | 0 refills | Status: DC

## 2021-01-05 MED ORDER — TIZANIDINE HCL 4 MG PO TABS: 4.0000 mg | ORAL_TABLET | Freq: Three times a day (TID) | ORAL | 0 refills | Status: DC | PRN

## 2021-01-05 NOTE — ED Notes (Signed)
Attempt to call to room.

## 2021-01-05 NOTE — ED Triage Notes (Signed)
Pt presents with multiple complaints. States has had leg swelling xs 2 weeks.   C/o lower back pain that radiates into legs xs 2 weeks that comes and goes.   C/o rash on bilateral elbows with minimal itching.

## 2021-01-05 NOTE — ED Provider Notes (Signed)
[facility]    CSN: [id number] Arrival date & time: 01/05/21  1034      History   Chief Complaint Chief Complaint  Patient presents with   Leg Swelling    HPI Chelsea Branch is a 55 y.o. female.   Patient here for evaluation of bilateral lower leg swelling, back pain, and rash on bilateral elbows.  Reports lower leg swelling that gets worse throughout the day.  Denies any shortness of breath or chest pain.  Denies any significant weight gain but reports that she does not weigh herself daily.  Reports taking "fluid pills" and states that she is taking as prescribed.  Also reports lower back pain that has been ongoing for the past several weeks.  Reports history of same.  Reports that it is relieved with voltaren gel and muscle relaxers but does not have any more medications at home.  Lastly reports rash on bilateral elbows for the past month.  Reports changed soap and detergent several months ago.  Also requesting COVID test due to possible exposure.    The history is provided by the patient.   Past Medical History:  Diagnosis Date   Abnormal thyroid blood test 03/15/2017   Abnormal uterine bleeding    Alopecia    Anemia    Angina pectoris with normal coronary arteriogram ([place]) 2017   Had + Troponin c/w ? 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 iniatially ?2/2 peripartum in 1994 - improved by 2008 then worsening EF in 2011 back down to EF 25-30%. b. echo 01/21/14 showed mod LVH, [name]; 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   NSTEMI (non-ST elevated myocardial infarction) ([place]) - with normal cornaries on Cath [id number]   Systolic and diastolic CHF, acute on chronic ([place]) 06/14/2015   H/O noncompliance with medical treatment, presenting hazards to health    Low back pain 09/16/2014   Homelessness 09/16/2014   Vitamin D deficiency 01/29/2014   OSA (obstructive sleep apnea) 01/08/2014   Chronic combined systolic and diastolic CHF (congestive heart failure) ([place]) 08/14/2013   Essential hypertension    Fibroids 08/09/2012   Menorrhagia 05/08/2012   Female pattern hair loss    HLD (hyperlipidemia) 06/08/2010   CEREBRAL ANEURYSM 06/08/2010   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   CEREBROVASCULAR ACCIDENT, HX OF 06/08/2010    Past Surgical History:  Procedure Laterality Date   CARDIAC CATHETERIZATION N/A 06/16/2015   Procedure: Left Heart Cath  and Coronary Angiography;  Surgeon: Jettie Booze, MD;  Location: [facility];  Service: Cardiovascular;  Laterality: N/A;   CESAREAN SECTION  1992  1994   LOOP RECORDER IMPLANT  ~ [place]     TUBAL LIGATION  1994    OB History     Gravida  2   Para  2   Term  2   Preterm      AB      Living  3      SAB      IAB      Ectopic      Multiple  1   Live Births  3            Home Medications    Prior to Admission medications   Medication Sig Start Date End Date Taking?  Authorizing Provider  acetaminophen (TYLENOL) 500 MG tablet Take 1-2 tablets ('500mg'$ -'1000mg'$ ) every 6 hours as needed. 01/05/21  Yes Pearson Forster, NP  diclofenac Sodium (VOLTAREN) 1 % GEL Apply 4 g topically 4 (four) times daily. 01/05/21  Yes Pearson Forster, NP  triamcinolone cream (KENALOG) 0.1 % Apply 1 application topically 2 (two) times daily. 01/05/21  Yes Pearson Forster, NP  albuterol (VENTOLIN HFA) 108 (90 Base) MCG/ACT inhaler Inhale 1-2 puffs into the lungs every 6 (six) hours as needed for wheezing or shortness of breath. 06/07/20   Vanessa Kick, MD  aspirin 81 MG EC tablet Take 1 tablet (81 mg total) by mouth daily. 03/31/16   Milagros Loll, MD  cetirizine (ZYRTEC) 10 MG tablet Take 1 tablet (10 mg total) by mouth daily. 08/19/20   Jaynee Eagles, PA-C  diphenhydrAMINE (BENADRYL) 25 MG tablet Take 1 tablet (25 mg total) by mouth every 6 (six) hours. 09/13/19   Loura Halt A, NP  fluticasone (FLONASE) 50 MCG/ACT nasal spray Place 2 sprays into both nostrils daily. 08/19/20   Jaynee Eagles, PA-C  guaiFENesin (MUCINEX) 600 MG 12 hr tablet Take 1 tablet (600 mg total) by mouth 2 (two) times daily as needed. 10/14/20   Volney American, PA-C  ibuprofen (ADVIL) 800 MG tablet Take 1 tablet (800 mg total) by mouth 3 (three) times daily. 06/24/20   Hazel Sams, PA-C  magnesium oxide (MAG-OX) 400 MG tablet TAKE 1 CAPSULE (400 MG TOTAL) BY MOUTH 2 (TWO) TIMES DAILY. 01/22/20   Deboraha Sprang, MD  Menthol (CEPACOL SORE THROAT) 5.4 MG LOZG Use as directed 1 lozenge (5.4 mg total) in the mouth or throat every 2 (two) hours as needed. 02/11/20   Darr, Edison Nasuti, PA-C  metoprolol succinate (TOPROL-XL) 50 MG 24 hr tablet Take 1 tablet (50 mg total) by mouth daily. 12/26/20   Deboraha Sprang, MD  Misc. Devices MISC Please provide patient with insurance approved straight cane 09/29/18   Gildardo Pounds, NP  Misc. Devices MISC Single-point cane with adjustable height x1, shower chair x1, transfer bench x1.   Diagnosis congestive heart failure, degenerative disc disease of the lumbar spine. 11/23/18   Charlott Rakes, MD  mupirocin ointment (BACTROBAN) 2 % Apply 1 application topically 2 (two) times daily. 11/03/19   Wieters, Hallie C, PA-C  ondansetron (ZOFRAN ODT) 4 MG disintegrating tablet Take 1 tablet (4 mg total) by mouth every 8 (eight) hours as needed for nausea or vomiting. 09/11/20   Lamptey, Myrene Galas, MD  potassium chloride SA (KLOR-CON M20) 20 MEQ tablet TAKE 1-2 TABS BY MOUTH AS DIRECTED.  TAKE 2 TABLETS IN THE MORNING AND 1 TABLET IN THE EVENING 05/09/20   Richardson Dopp T, PA-C  promethazine-dextromethorphan (PROMETHAZINE-DM) 6.25-15 MG/5ML syrup Take 5 mLs by mouth 4 (four) times daily as needed for cough. 06/07/20   Vanessa Kick, MD  promethazine-dextromethorphan (PROMETHAZINE-DM) 6.25-15 MG/5ML syrup Take 5 mLs by mouth 4 (four) times daily as needed for cough. 10/14/20   Volney American, PA-C  pseudoephedrine (SUDAFED) 30 MG tablet Take 1 tablet (30 mg total) by mouth every 8 (eight) hours as needed for congestion. 08/19/20   Jaynee Eagles, PA-C  sacubitril-valsartan (ENTRESTO) 24-26 MG Take 1 tablet by mouth 2 (two) times daily. >>SEE DIRECTIONS AS YOUR DOSE HAS CHANGED<< 12/09/20   Deboraha Sprang, MD  simvastatin (ZOCOR) 10 MG tablet TAKE 1 TABLET BY MOUTH EVERY DAY IN THE EVENING 04/14/20   Deboraha Sprang, MD  spironolactone (ALDACTONE) 25 MG tablet Take 0.5 tablets (12.5 mg total) by mouth daily. 05/02/20   Deboraha Sprang, MD  tiZANidine (ZANAFLEX) 4 MG tablet Take 1 tablet (4 mg total) by mouth every 8 (eight) hours as needed for muscle spasms. DO NOT DRINK ALCOHOL OR DRIVE WHILE TAKING THIS MEDICATION 01/05/21   Pearson Forster, NP  torsemide (DEMADEX) 20 MG tablet Take 1 tablet (20 mg total) by mouth daily. 03/05/20   Richardson Dopp T, PA-C  DULoxetine (CYMBALTA) 30 MG capsule Take 1 capsule (30 mg total) by mouth daily. 12/07/18 12/10/19  Charlott Rakes, MD  famotidine (PEPCID) 20 MG  tablet Take 1 tablet (20 mg total) 2 (two) times daily by mouth. 04/10/17 11/03/19  Albesa Seen, PA-C    Family History Family History  Problem Relation Age of Onset   Cancer Maternal Grandmother        uterine   Hypertension Sister    Healthy Mother    Other Neg Hx    Heart disease Neg Hx     Social History Social History   Tobacco Use   Smoking status: Former    Packs/day: 0.10    Years: 30.00    Pack years: 3.00    Types: Cigarettes    Quit date: 06/03/2015    Years since quitting: 5.5   Smokeless tobacco: Never   Tobacco comments:    Pt. stated she stopped smoking a year ago. 09/29/2018  Vaping Use   Vaping Use: Never used  Substance Use Topics   Alcohol use: No    Alcohol/week: 0.0 standard drinks   Drug use: No     Allergies   Ace inhibitors   Review of Systems Review of Systems  Respiratory:  Negative for cough, chest tightness and shortness of breath.   Cardiovascular:  Positive for leg swelling. Negative for chest pain.  Musculoskeletal:  Positive for arthralgias, back pain and myalgias.  Skin:  Positive for rash.  All other systems reviewed and are negative.   Physical Exam Triage Vital Signs ED Triage Vitals  Enc Vitals Group     BP 01/05/21 1116 109/72     Pulse Rate 01/05/21 1116 77     Resp 01/05/21 1116 16     Temp 01/05/21 1116 98.3 F (36.8 C)     Temp Source 01/05/21 1116 Oral     SpO2 01/05/21 1116 98 %     Weight --      Height --      Head Circumference --      Peak Flow --      Pain Score 01/05/21  1113 8     Pain Loc --      Pain Edu? --      Excl. in [place]? --    No data found.  Updated Vital Signs BP 109/72 (BP Location: Right Arm)   Pulse 77   Temp 98.3 F (36.8 C) (Oral)   Resp 16   SpO2 98%   Visual Acuity Right Eye Distance:   Left Eye Distance:   Bilateral Distance:    Right Eye Near:   Left Eye Near:    Bilateral Near:     Physical Exam Vitals and nursing note reviewed.  Constitutional:       General: She is not in acute distress.    Appearance: Normal appearance. She is not ill-appearing, toxic-appearing or diaphoretic.  HENT:     Head: Normocephalic and atraumatic.  Eyes:     Conjunctiva/sclera: Conjunctivae normal.  Cardiovascular:     Rate and Rhythm: Normal rate and regular rhythm.     Pulses: Normal pulses.     Heart sounds: Normal heart sounds.  Pulmonary:     Effort: Pulmonary effort is normal.     Breath sounds: Normal breath sounds.  Abdominal:     General: Abdomen is flat.  Musculoskeletal:        General: Normal range of motion.     Cervical back: Normal and normal range of motion.     Thoracic back: Normal.     Lumbar back: Spasms and tenderness present. No bony tenderness. Normal range of motion. Negative right straight leg raise test and negative left straight leg raise test.     Right lower leg: 1+ Edema present.     Left lower leg: 1+ Edema present.  Skin:    General: Skin is warm and dry.     Findings: Rash present. Rash is papular (bilateral elbows).  Neurological:     General: No focal deficit present.     Mental Status: She is alert and oriented to person, place, and time.  Psychiatric:        Mood and Affect: Mood normal.     UC Treatments / Results  Labs (all labs ordered are listed, but only abnormal results are displayed) Labs Reviewed  SARS CORONAVIRUS 2 (TAT 6-24 HRS)    EKG   Radiology No results found.  Procedures Procedures (including critical care time)  Medications Ordered in UC Medications - No data to display  Initial Impression / Assessment and Plan / UC Course  I have reviewed the triage vital signs and the nursing notes.  Pertinent labs & imaging results that were available during my care of the patient were reviewed by me and considered in my medical decision making (see chart for details).    Assessment negative for red flags or concerns.  Peripheral edema Elevate legs when sitting or laying  down Compression socks when standing or walking Continue to take fluid pills as previously prescribed Daily weights Follow up with cardiology as soon as possible Acute bilateral lower back pain Tylenol as needed for pain.  May also try ibuprofen  Voltaren gel as needed for pain  Tizanidine as needed for muscle pain and spasms.  Do not drive while taking medication  Conservative symptom management, including heat and ice.  Rash and nonspecific skin eruption Likely contact dermatitis Do not change lotions, soaps, or detergents.  Triamcinolone cream as needed for itching Follow up with primary care or dermatology for re-evaluation.  Final Clinical Impressions(s) / UC Diagnoses  Final diagnoses:  Peripheral edema  Acute bilateral low back pain, unspecified whether sciatica present  Rash and nonspecific skin eruption     Discharge Instructions      For your leg swelling:  Elevate your legs anytime you are sitting or laying down.  Continue to take your fluid pills as previously prescribed.  You can try compression socks to help with swelling.  Weigh yourself daily.  Follow up with your cardiologist as soon as possible.   For your back pain:  You can take the Tylenol as needed for pain. You can also try ibuprofen.  Apply the voltaren gel as needed for back pain.  You can try heat, ice, or alternate between heat and ice for comfort.  You can try lidocaine patches or Icy/Hot.  You can use the Tizanidine as needed for muscle pain and spasms.  It can make you sleepy so do not take it prior to driving.    For your rash:  Do not change soaps, lotions, or detergents.  You can apply the triamcinolone cream twice a day as needed for itching.   Follow up with your primary care provider for re-evaluation of rash and back pain.   Return or go to the Emergency Department if symptoms worsen or do not improve in the next few days.       ED Prescriptions     Medication Sig Dispense  Auth. Provider   diclofenac Sodium (VOLTAREN) 1 % GEL Apply 4 g topically 4 (four) times daily. 100 g Pearson Forster, NP   acetaminophen (TYLENOL) 500 MG tablet Take 1-2 tablets ('500mg'$ -'1000mg'$ ) every 6 hours as needed. 30 tablet Caroll Rancher R, NP   triamcinolone cream (KENALOG) 0.1 % Apply 1 application topically 2 (two) times daily. 30 g Pearson Forster, NP   tiZANidine (ZANAFLEX) 4 MG tablet Take 1 tablet (4 mg total) by mouth every 8 (eight) hours as needed for muscle spasms. DO NOT DRINK ALCOHOL OR DRIVE WHILE TAKING THIS MEDICATION 12 tablet Pearson Forster, NP      PDMP not reviewed this encounter.   Pearson Forster, NP 01/05/21 1231

## 2021-01-05 NOTE — Discharge Instructions (Addendum)
For your leg swelling:  Elevate your legs anytime you are sitting or laying down.  Continue to take your fluid pills as previously prescribed.  You can try compression socks to help with swelling.  Weigh yourself daily.  Follow up with your cardiologist as soon as possible.   For your back pain:  You can take the Tylenol as needed for pain. You can also try ibuprofen.  Apply the voltaren gel as needed for back pain.  You can try heat, ice, or alternate between heat and ice for comfort.  You can try lidocaine patches or Icy/Hot.  You can use the Tizanidine as needed for muscle pain and spasms.  It can make you sleepy so do not take it prior to driving.    For your rash:  Do not change soaps, lotions, or detergents.  You can apply the triamcinolone cream twice a day as needed for itching.   Follow up with your primary care provider for re-evaluation of rash and back pain.   Return or go to the Emergency Department if symptoms worsen or do not improve in the next few days.

## 2021-01-12 DIAGNOSIS — M5416 Radiculopathy, lumbar region: Secondary | ICD-10-CM | POA: Diagnosis not present

## 2021-02-10 DIAGNOSIS — M5416 Radiculopathy, lumbar region: Secondary | ICD-10-CM | POA: Diagnosis not present

## 2021-02-10 DIAGNOSIS — M48061 Spinal stenosis, lumbar region without neurogenic claudication: Secondary | ICD-10-CM | POA: Diagnosis not present

## 2021-02-23 ENCOUNTER — Other Ambulatory Visit: Payer: Self-pay

## 2021-02-23 ENCOUNTER — Encounter (HOSPITAL_COMMUNITY): Payer: Self-pay

## 2021-02-23 ENCOUNTER — Ambulatory Visit (HOSPITAL_COMMUNITY)
Admission: EM | Admit: 2021-02-23 | Discharge: 2021-02-23 | Disposition: A | Discharge status: Home / Self Care | Payer: Medicaid Other | Attending: Emergency Medicine | Admitting: Emergency Medicine

## 2021-02-23 DIAGNOSIS — N939 Abnormal uterine and vaginal bleeding, unspecified: Secondary | ICD-10-CM | POA: Diagnosis not present

## 2021-02-23 DIAGNOSIS — R109 Unspecified abdominal pain: Secondary | ICD-10-CM | POA: Insufficient documentation

## 2021-02-23 LAB — POCT URINALYSIS DIPSTICK, ED / UC
Bilirubin Urine: NEGATIVE
Glucose, UA: NEGATIVE mg/dL
Ketones, ur: NEGATIVE mg/dL
Leukocytes,Ua: NEGATIVE
Nitrite: NEGATIVE
Protein, ur: NEGATIVE mg/dL
Specific Gravity, Urine: 1.02 (ref 1.005–1.030)
Urobilinogen, UA: 1 mg/dL (ref 0.0–1.0)
pH: 6.5 (ref 5.0–8.0)

## 2021-02-23 MED ORDER — METOPROLOL SUCCINATE ER 50 MG PO TB24: 50.0000 mg | ORAL_TABLET | Freq: Every day | ORAL | 0 refills | Status: DC

## 2021-02-23 MED ORDER — DICYCLOMINE HCL 20 MG PO TABS: 20.0000 mg | ORAL_TABLET | Freq: Two times a day (BID) | ORAL | 0 refills | Status: DC

## 2021-02-23 MED ORDER — ONDANSETRON HCL 4 MG PO TABS: 4.0000 mg | ORAL_TABLET | Freq: Four times a day (QID) | ORAL | 0 refills | Status: DC

## 2021-02-23 NOTE — ED Triage Notes (Signed)
Pt presents with R side pain. States the pain started after sexual intercourse with her boyfriend.  Pt states she noticed her urine smelled and describes pain as a sharp pain.

## 2021-02-23 NOTE — Discharge Instructions (Addendum)
Follow up with obgyn to check on fibroids We will send off urine test and other STI it will return in 3 days. Will call if any treatment is needed Take pain meds as needed  Pain could be from several things not able to r/o fibroids

## 2021-02-23 NOTE — ED Provider Notes (Signed)
[facility]    CSN: [id number] Arrival date & time: 02/23/21  9024      History   Chief Complaint Chief Complaint  Patient presents with   Flank Pain    HPI Chelsea Branch is a 55 y.o. female.   Pt had intercourse 2 days ago and noticed rt side flank pain intermit. Took an asa and noticed slight pink tinge after voiding. Pt does have an IUD for 3 years now,also hx of fibroids. Denies any vaginal d/c. No n/v/d. Void frequently but stats that she is on a fluid pills also. Has not taken anything pta    Past Medical History:  Diagnosis Date   Abnormal thyroid blood test 03/15/2017   Abnormal uterine bleeding    Alopecia    Anemia    Angina pectoris with normal coronary arteriogram ([place]) 2017   Had + Troponin c/w ? 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 iniatially ?2/2 peripartum in 1994 - improved by 2008 then worsening EF in 2011 back down to EF 25-30%. b. echo 01/21/14 showed mod LVH, [name]; 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   Peripartum cardiomyopathy 1994   Sleep apnea 2015   CPAP 12/2013   Stroke ([place]) 0973   Systolic and diastolic CHF, acute on chronic ([place]) 06/14/2015   Tobacco abuse     Patient Active Problem List   Diagnosis Date Noted   Degenerative disc disease at L5-S1 level 10/10/2018   Abnormal thyroid blood test 03/15/2017   Hypokalemia 07/23/2016   Low TSH level 04/26/2016   Abnormal uterine bleeding 04/06/2016   Nonspecific chest pain    Nonischemic  dilated cardiomyopathy ([place]) 06/17/2015   NSTEMI (non-ST elevated myocardial infarction) ([place]) - with normal cornaries on Cath 53/29/9242   Systolic and diastolic CHF, acute on chronic ([place]) 06/14/2015   H/O noncompliance with medical treatment, presenting hazards to health    Low back pain 09/16/2014   Homelessness 09/16/2014   Vitamin D deficiency 01/29/2014   OSA (obstructive sleep apnea) 01/08/2014   Chronic combined systolic and diastolic CHF (congestive heart failure) ([place]) 08/14/2013   Essential hypertension    Fibroids 08/09/2012   Menorrhagia 05/08/2012   Female pattern hair loss    HLD (hyperlipidemia) 06/08/2010   CEREBRAL ANEURYSM 06/08/2010   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   CEREBROVASCULAR ACCIDENT, HX OF 06/08/2010    Past Surgical History:  Procedure Laterality Date   CARDIAC CATHETERIZATION N/A 06/16/2015   Procedure: Left Heart Cath and Coronary Angiography;  Surgeon: Jettie Booze, MD;  Location: [facility];  Service: Cardiovascular;  Laterality: N/A;   [place] IMPLANT  ~ [employer]     TUBAL LIGATION  1994    OB History     Gravida  2   Para  2   Term  2   Preterm      AB  Living  3      SAB      IAB      Ectopic      Multiple  1   Live Births  3            Home Medications    Prior to Admission medications   Medication Sig Start Date End Date Taking? Authorizing Provider  dicyclomine (BENTYL) 20 MG tablet Take 1 tablet (20 mg total) by mouth 2 (two) times daily. 02/23/21  Yes Marney Setting, NP  ondansetron (ZOFRAN) 4 MG tablet Take 1 tablet (4 mg total) by mouth every 6 (six) hours. 02/23/21  Yes Marney Setting, NP  acetaminophen (TYLENOL) 500 MG tablet Take 1-2 tablets (500mg -1000mg ) every 6 hours as needed. 01/05/21   Pearson Forster, NP  albuterol (VENTOLIN HFA) 108 (90 Base) MCG/ACT inhaler Inhale 1-2 puffs into the lungs every 6 (six)  hours as needed for wheezing or shortness of breath. 06/07/20   Vanessa Kick, MD  aspirin 81 MG EC tablet Take 1 tablet (81 mg total) by mouth daily. 03/31/16   Milagros Loll, MD  cetirizine (ZYRTEC) 10 MG tablet Take 1 tablet (10 mg total) by mouth daily. 08/19/20   Jaynee Eagles, PA-C  diclofenac Sodium (VOLTAREN) 1 % GEL Apply 4 g topically 4 (four) times daily. 01/05/21   Pearson Forster, NP  diphenhydrAMINE (BENADRYL) 25 MG tablet Take 1 tablet (25 mg total) by mouth every 6 (six) hours. 09/13/19   Loura Halt A, NP  fluticasone (FLONASE) 50 MCG/ACT nasal spray Place 2 sprays into both nostrils daily. 08/19/20   Jaynee Eagles, PA-C  guaiFENesin (MUCINEX) 600 MG 12 hr tablet Take 1 tablet (600 mg total) by mouth 2 (two) times daily as needed. 10/14/20   Volney American, PA-C  ibuprofen (ADVIL) 800 MG tablet Take 1 tablet (800 mg total) by mouth 3 (three) times daily. 06/24/20   Hazel Sams, PA-C  magnesium oxide (MAG-OX) 400 MG tablet TAKE 1 CAPSULE (400 MG TOTAL) BY MOUTH 2 (TWO) TIMES DAILY. 01/22/20   Deboraha Sprang, MD  Menthol (CEPACOL SORE THROAT) 5.4 MG LOZG Use as directed 1 lozenge (5.4 mg total) in the mouth or throat every 2 (two) hours as needed. 02/11/20   Darr, Edison Nasuti, PA-C  metoprolol succinate (TOPROL-XL) 50 MG 24 hr tablet Take 1 tablet (50 mg total) by mouth daily. Please make yearly appt with Dr. Caryl Comes for December 2022 for future refills. Thank you 1st attempt 02/23/21   Deboraha Sprang, MD  Misc. Devices MISC Please provide patient with insurance approved straight cane 09/29/18   Gildardo Pounds, NP  Misc. Devices MISC Single-point cane with adjustable height x1, shower chair x1, transfer bench x1.  Diagnosis congestive heart failure, degenerative disc disease of the lumbar spine. 11/23/18   Charlott Rakes, MD  mupirocin ointment (BACTROBAN) 2 % Apply 1 application topically 2 (two) times daily. 11/03/19   Wieters, Hallie C, PA-C  ondansetron (ZOFRAN ODT) 4 MG disintegrating  tablet Take 1 tablet (4 mg total) by mouth every 8 (eight) hours as needed for nausea or vomiting. 09/11/20   Lamptey, Myrene Galas, MD  potassium chloride SA (KLOR-CON M20) 20 MEQ tablet TAKE 1-2 TABS BY MOUTH AS DIRECTED. TAKE 2 TABLETS IN THE MORNING AND 1 TABLET IN THE EVENING 05/09/20   Richardson Dopp T, PA-C  promethazine-dextromethorphan (PROMETHAZINE-DM) 6.25-15 MG/5ML syrup Take 5 mLs by mouth 4 (four) times daily as needed for cough. 06/07/20  Vanessa Kick, MD  promethazine-dextromethorphan (PROMETHAZINE-DM) 6.25-15 MG/5ML syrup Take 5 mLs by mouth 4 (four) times daily as needed for cough. 10/14/20   Volney American, PA-C  pseudoephedrine (SUDAFED) 30 MG tablet Take 1 tablet (30 mg total) by mouth every 8 (eight) hours as needed for congestion. 08/19/20   Jaynee Eagles, PA-C  sacubitril-valsartan (ENTRESTO) 24-26 MG Take 1 tablet by mouth 2 (two) times daily. >>SEE DIRECTIONS AS YOUR DOSE HAS CHANGED<< 12/09/20   Deboraha Sprang, MD  simvastatin (ZOCOR) 10 MG tablet TAKE 1 TABLET BY MOUTH EVERY DAY IN THE EVENING 04/14/20   Deboraha Sprang, MD  spironolactone (ALDACTONE) 25 MG tablet Take 0.5 tablets (12.5 mg total) by mouth daily. 05/02/20   Deboraha Sprang, MD  tiZANidine (ZANAFLEX) 4 MG tablet Take 1 tablet (4 mg total) by mouth every 8 (eight) hours as needed for muscle spasms. DO NOT DRINK ALCOHOL OR DRIVE WHILE TAKING THIS MEDICATION 01/05/21   Pearson Forster, NP  torsemide (DEMADEX) 20 MG tablet Take 1 tablet (20 mg total) by mouth daily. 03/05/20   Richardson Dopp T, PA-C  triamcinolone cream (KENALOG) 0.1 % Apply 1 application topically 2 (two) times daily. 01/05/21   Pearson Forster, NP  DULoxetine (CYMBALTA) 30 MG capsule Take 1 capsule (30 mg total) by mouth daily. 12/07/18 12/10/19  Charlott Rakes, MD  famotidine (PEPCID) 20 MG tablet Take 1 tablet (20 mg total) 2 (two) times daily by mouth. 04/10/17 11/03/19  Albesa Seen, PA-C    Family History Family History  Problem Relation  Age of Onset   Cancer Maternal Grandmother        uterine   Hypertension Sister    Healthy Mother    Other Neg Hx    Heart disease Neg Hx     Social History Social History   Tobacco Use   Smoking status: Former    Packs/day: 0.10    Years: 30.00    Pack years: 3.00    Types: Cigarettes    Quit date: 06/03/2015    Years since quitting: 5.7   Smokeless tobacco: Never   Tobacco comments:    Pt. stated she stopped smoking a year ago. 09/29/2018  Vaping Use   Vaping Use: Never used  Substance Use Topics   Alcohol use: No    Alcohol/week: 0.0 standard drinks   Drug use: No     Allergies   Ace inhibitors   Review of Systems Review of Systems  Constitutional:  Negative for chills and fever.  HENT: Negative.    Eyes: Negative.   Respiratory: Negative.    Cardiovascular: Negative.   Gastrointestinal:  Positive for abdominal pain. Negative for diarrhea, nausea, rectal pain and vomiting.  Genitourinary:  Positive for menstrual problem, pelvic pain and vaginal bleeding. Negative for decreased urine volume, urgency, vaginal discharge and vaginal pain.  Skin: Negative.   Neurological: Negative.     Physical Exam Triage Vital Signs ED Triage Vitals [02/23/21 0851]  Enc Vitals Group     BP 110/79     Pulse Rate 80     Resp 19     Temp 98.1 F (36.7 C)     Temp Source Oral     SpO2 99 %     Weight      Height      Head Circumference      Peak Flow      Pain Score 4     Pain Loc  Pain Edu?      Excl. in [place]?    No data found.  Updated Vital Signs BP 110/79 (BP Location: Right Arm)   Pulse 80   Temp 98.1 F (36.7 C) (Oral)   Resp 19   SpO2 99%   Visual Acuity Right Eye Distance:   Left Eye Distance:   Bilateral Distance:    Right Eye Near:   Left Eye Near:    Bilateral Near:     Physical Exam Constitutional:      Appearance: Normal appearance.  Cardiovascular:     Rate and Rhythm: Normal rate.  Pulmonary:     Effort: Pulmonary effort is  normal.  Abdominal:     General: Bowel sounds are normal.     Tenderness: There is abdominal tenderness. There is no right CVA tenderness or left CVA tenderness.     Comments: Rt upper abd pain, more generalized to rt flank area tenderness on palpation,   Skin:    General: Skin is warm.  Neurological:     General: No focal deficit present.     Mental Status: She is alert.     UC Treatments / Results  Labs (all labs ordered are listed, but only abnormal results are displayed) Labs Reviewed  POCT URINALYSIS DIPSTICK, ED / UC - Abnormal; Notable for the following components:      Result Value   Hgb urine dipstick TRACE (*)    All other components within normal limits  CERVICOVAGINAL ANCILLARY ONLY    EKG   Radiology No results found.  Procedures Procedures (including critical care time)  Medications Ordered in UC Medications - No data to display  Initial Impression / Assessment and Plan / UC Course  I have reviewed the triage vital signs and the nursing notes.  Pertinent labs & imaging results that were available during my care of the patient were reviewed by me and considered in my medical decision making (see chart for details).    Follow up with obgyn to check on fibroids We will send off urine test and other STI it will return in 3 days. Will call if any treatment is needed Take pain meds as needed  Pain could be from several things not able to r/o fibroids you will need an ultrasound to r/o  If pain becomes worse go to ER  Reviewed previous hx   Final Clinical Impressions(s) / UC Diagnoses   Final diagnoses:  Right flank pain  Vaginal bleeding     Discharge Instructions      Follow up with obgyn to check on fibroids We will send off urine test and other STI it will return in 3 days. Will call if any treatment is needed Take pain meds as needed  Pain could be from several things not able to r/o fibroids       ED Prescriptions     Medication Sig  Dispense Auth. Provider   dicyclomine (BENTYL) 20 MG tablet Take 1 tablet (20 mg total) by mouth 2 (two) times daily. 20 tablet Morley Kos L, NP   ondansetron (ZOFRAN) 4 MG tablet Take 1 tablet (4 mg total) by mouth every 6 (six) hours. 12 tablet Marney Setting, NP      PDMP not reviewed this encounter.   Marney Setting, NP 02/23/21 1017

## 2021-02-24 LAB — CERVICOVAGINAL ANCILLARY ONLY
Bacterial Vaginitis (gardnerella): POSITIVE — AB
Candida Glabrata: NEGATIVE
Candida Vaginitis: NEGATIVE
Chlamydia: NEGATIVE
Comment: NEGATIVE
Comment: NEGATIVE
Comment: NEGATIVE
Comment: NEGATIVE
Comment: NEGATIVE
Comment: NORMAL
Neisseria Gonorrhea: NEGATIVE
Trichomonas: NEGATIVE

## 2021-02-25 ENCOUNTER — Telehealth (HOSPITAL_COMMUNITY): Payer: Self-pay | Admitting: Emergency Medicine

## 2021-02-25 MED ORDER — METRONIDAZOLE 500 MG PO TABS: 500.0000 mg | ORAL_TABLET | Freq: Two times a day (BID) | ORAL | 0 refills | Status: DC

## 2021-05-19 ENCOUNTER — Other Ambulatory Visit: Payer: Self-pay

## 2021-05-19 ENCOUNTER — Encounter (HOSPITAL_COMMUNITY): Payer: Self-pay | Admitting: Emergency Medicine

## 2021-05-19 ENCOUNTER — Ambulatory Visit (HOSPITAL_COMMUNITY)
Admission: EM | Admit: 2021-05-19 | Discharge: 2021-05-19 | Disposition: A | Discharge status: Home / Self Care | Payer: Medicaid Other | Attending: Student | Admitting: Student

## 2021-05-19 DIAGNOSIS — H60333 Swimmer's ear, bilateral: Secondary | ICD-10-CM

## 2021-05-19 MED ORDER — AMOXICILLIN-POT CLAVULANATE 875-125 MG PO TABS: 1.0000 | ORAL_TABLET | Freq: Two times a day (BID) | ORAL | 0 refills | Status: DC

## 2021-05-19 MED ORDER — OFLOXACIN 0.3 % OT SOLN: 3.0000 [drp] | Freq: Two times a day (BID) | OTIC | 0 refills | Status: AC

## 2021-05-19 MED ORDER — ACETAMINOPHEN 500 MG PO TABS: 500.0000 mg | ORAL_TABLET | Freq: Four times a day (QID) | ORAL | 0 refills | Status: DC | PRN

## 2021-05-19 NOTE — ED Triage Notes (Signed)
Pt c/o bilat ear drainage and pain for several days. Making her feel off balance.

## 2021-05-19 NOTE — Discharge Instructions (Addendum)
-  Start the antibiotic-Augmentin (amoxicillin-clavulanate), 1 pill every 12 hours for 7 days.  You can take this with food like with breakfast and dinner. -Ofloxacin drops both ears twice daily x7 days. Keep your ears dry x7 days while you heal! -Tylenol as needed -Follow-up if new/worsening symptoms like shortness of breath, chest pain, new fevers/chills

## 2021-05-19 NOTE — ED Provider Notes (Signed)
[facility]    CSN: [id number] Arrival date & time: 05/19/21  1058      History   Chief Complaint Chief Complaint  Patient presents with   Ear Drainage    HPI Chelsea Branch is a 55 y.o. female presenting with bilateral ear drainage and pain for few weeks.  Medical history as below.  Describes clear drainage from bilateral ears with muffled hearing and sharp pain, symptoms getting worse. Feels off balance, but denies current dizziness, headaches, weakness. Symptoms began following viral URI, also states that she gets dust and wind in her ears at work.  Denies fever/chills, shortness of breath, chest pain, dizziness, weakness.  Has not tried interventions at home including cleaning her ears out.  HPI  Past Medical History:  Diagnosis Date   Abnormal thyroid blood test 03/15/2017   Abnormal uterine bleeding    Alopecia    Anemia    Angina pectoris with normal coronary arteriogram ([place]) 2017   Had + Troponin c/w ? 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 iniatially ?2/2 peripartum in 1994 - improved by 2008 then worsening EF in 2011 back down to EF 25-30%. b. echo 01/21/14 showed mod LVH, [name]; 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   NSTEMI (non-ST elevated myocardial infarction) ([place]) - with normal cornaries on Cath 67/34/1937   Systolic and diastolic CHF, acute on chronic ([place]) 06/14/2015   H/O noncompliance with medical treatment, presenting hazards to health    Low back pain 09/16/2014   Homelessness 09/16/2014   Vitamin D deficiency 01/29/2014   OSA (obstructive sleep apnea) 01/08/2014   Chronic combined systolic and diastolic CHF (congestive heart failure) ([place]) 08/14/2013   Essential hypertension    Fibroids 08/09/2012   Menorrhagia 05/08/2012   Female pattern hair loss    HLD (hyperlipidemia) 06/08/2010   CEREBRAL ANEURYSM 06/08/2010   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   CEREBROVASCULAR ACCIDENT, HX OF 06/08/2010    Past Surgical History:  Procedure Laterality Date   CARDIAC CATHETERIZATION N/A 06/16/2015   Procedure: Left Heart Cath and Coronary Angiography;  Surgeon: Jettie Booze, MD;  Location: [facility];  Service: Cardiovascular;  Laterality: N/A;   CESAREAN SECTION  1992  1994   LOOP RECORDER IMPLANT  ~ [place]    OB History     Gravida  2   Para  2   Term  2   Preterm      AB      Living  3      SAB      IAB      Ectopic      Multiple  1   Live Births  3            Home Medications    Prior to Admission medications   Medication Sig Start Date End Date Taking? Authorizing Provider  acetaminophen (TYLENOL) 500 MG tablet Take 1 tablet (500 mg total) by mouth every 6 (six) hours as needed. 05/19/21  Yes Hazel Sams, PA-C  amoxicillin-clavulanate (AUGMENTIN) 875-125 MG tablet Take 1 tablet by mouth every 12 (twelve) hours. 05/19/21  Yes Hazel Sams, PA-C   ofloxacin (FLOXIN) 0.3 % OTIC solution Place 3 drops into both ears 2 (two) times daily for 7 days. 05/19/21 05/26/21 Yes Hazel Sams, PA-C  albuterol (VENTOLIN HFA) 108 (90 Base) MCG/ACT inhaler Inhale 1-2 puffs into the lungs every 6 (six) hours as needed for wheezing or shortness of breath. 06/07/20   Vanessa Kick, MD  aspirin 81 MG EC tablet Take 1 tablet (81 mg total) by mouth daily. 03/31/16   Milagros Loll, MD  cetirizine (ZYRTEC) 10 MG tablet Take 1 tablet (10 mg total) by mouth daily. 08/19/20   Jaynee Eagles, PA-C  diclofenac Sodium (VOLTAREN) 1 % GEL Apply 4 g topically 4 (four) times daily. 01/05/21   Pearson Forster, NP  dicyclomine (BENTYL) 20 MG tablet Take 1 tablet (20 mg total) by mouth 2 (two) times daily. 02/23/21   Marney Setting, NP  diphenhydrAMINE (BENADRYL) 25 MG tablet Take 1 tablet (25 mg total) by mouth every 6 (six) hours. 09/13/19   Loura Halt A, NP  fluticasone (FLONASE) 50 MCG/ACT nasal spray Place 2 sprays into both nostrils daily. 08/19/20   Jaynee Eagles, PA-C  guaiFENesin (MUCINEX) 600 MG 12 hr tablet Take 1 tablet (600 mg total) by mouth 2 (two) times daily as needed. 10/14/20   Volney American, PA-C  ibuprofen (ADVIL) 800 MG tablet Take 1 tablet (800 mg total) by mouth 3 (three) times daily. 06/24/20   Hazel Sams, PA-C  magnesium oxide (MAG-OX) 400 MG tablet TAKE 1 CAPSULE (400 MG TOTAL) BY MOUTH 2 (TWO) TIMES DAILY. 01/22/20   Deboraha Sprang, MD  Menthol (CEPACOL SORE THROAT) 5.4 MG LOZG Use as directed 1 lozenge (5.4 mg total) in the mouth or throat every 2 (two) hours as needed. 02/11/20   Darr, Edison Nasuti, PA-C  metoprolol succinate (TOPROL-XL) 50 MG 24 hr tablet Take 1 tablet (50 mg total) by mouth daily. Please make yearly appt with Dr. Caryl Comes for December 2022 for future refills. Thank you 1st attempt 02/23/21   Deboraha Sprang, MD  metroNIDAZOLE (FLAGYL) 500 MG tablet Take 1 tablet (500 mg total) by mouth 2 (two) times daily. 02/25/21   LampteyMyrene Galas,  MD  Misc. Devices MISC Please provide patient with insurance approved straight cane 09/29/18   Gildardo Pounds, NP  Misc. Devices MISC Single-point cane with adjustable height x1, shower chair x1, transfer bench x1.  Diagnosis congestive heart failure, degenerative disc disease of the lumbar spine. 11/23/18   Charlott Rakes, MD  mupirocin ointment (BACTROBAN) 2 % Apply 1 application topically 2 (two) times daily. 11/03/19   Wieters, Hallie C, PA-C  ondansetron (ZOFRAN ODT) 4 MG disintegrating tablet Take 1 tablet (4 mg total)  by mouth every 8 (eight) hours as needed for nausea or vomiting. 09/11/20   Lamptey, Myrene Galas, MD  ondansetron (ZOFRAN) 4 MG tablet Take 1 tablet (4 mg total) by mouth every 6 (six) hours. 02/23/21   Marney Setting, NP  potassium chloride SA (KLOR-CON M20) 20 MEQ tablet TAKE 1-2 TABS BY MOUTH AS DIRECTED. TAKE 2 TABLETS IN THE MORNING AND 1 TABLET IN THE EVENING 05/09/20   Richardson Dopp T, PA-C  promethazine-dextromethorphan (PROMETHAZINE-DM) 6.25-15 MG/5ML syrup Take 5 mLs by mouth 4 (four) times daily as needed for cough. 06/07/20   Vanessa Kick, MD  promethazine-dextromethorphan (PROMETHAZINE-DM) 6.25-15 MG/5ML syrup Take 5 mLs by mouth 4 (four) times daily as needed for cough. 10/14/20   Volney American, PA-C  pseudoephedrine (SUDAFED) 30 MG tablet Take 1 tablet (30 mg total) by mouth every 8 (eight) hours as needed for congestion. 08/19/20   Jaynee Eagles, PA-C  sacubitril-valsartan (ENTRESTO) 24-26 MG Take 1 tablet by mouth 2 (two) times daily. >>SEE DIRECTIONS AS YOUR DOSE HAS CHANGED<< 12/09/20   Deboraha Sprang, MD  simvastatin (ZOCOR) 10 MG tablet TAKE 1 TABLET BY MOUTH EVERY DAY IN THE EVENING 04/14/20   Deboraha Sprang, MD  spironolactone (ALDACTONE) 25 MG tablet Take 0.5 tablets (12.5 mg total) by mouth daily. 05/02/20   Deboraha Sprang, MD  tiZANidine (ZANAFLEX) 4 MG tablet Take 1 tablet (4 mg total) by mouth every 8 (eight) hours as needed for muscle spasms. DO NOT  DRINK ALCOHOL OR DRIVE WHILE TAKING THIS MEDICATION 01/05/21   Pearson Forster, NP  torsemide (DEMADEX) 20 MG tablet Take 1 tablet (20 mg total) by mouth daily. 03/05/20   Richardson Dopp T, PA-C  triamcinolone cream (KENALOG) 0.1 % Apply 1 application topically 2 (two) times daily. 01/05/21   Pearson Forster, NP  DULoxetine (CYMBALTA) 30 MG capsule Take 1 capsule (30 mg total) by mouth daily. 12/07/18 12/10/19  Charlott Rakes, MD  famotidine (PEPCID) 20 MG tablet Take 1 tablet (20 mg total) 2 (two) times daily by mouth. 04/10/17 11/03/19  Albesa Seen, PA-C    Family History Family History  Problem Relation Age of Onset   Cancer Maternal Grandmother        uterine   Hypertension Sister    Healthy Mother    Other Neg Hx    Heart disease Neg Hx     Social History Social History   Tobacco Use   Smoking status: Former    Packs/day: 0.10    Years: 30.00    Pack years: 3.00    Types: Cigarettes    Quit date: 06/03/2015    Years since quitting: 5.9   Smokeless tobacco: Never   Tobacco comments:    Pt. stated she stopped smoking a year ago. 09/29/2018  Vaping Use   Vaping Use: Never used  Substance Use Topics   Alcohol use: No    Alcohol/week: 0.0 standard drinks   Drug use: No     Allergies   Ace inhibitors   Review of Systems Review of Systems  Constitutional:  Negative for appetite change, chills and fever.  HENT:  Positive for ear pain. Negative for congestion, rhinorrhea, sinus pressure, sinus pain and sore throat.   Eyes:  Negative for redness and visual disturbance.  Respiratory:  Negative for cough, chest tightness, shortness of breath and wheezing.   Cardiovascular:  Negative for chest pain and palpitations.  Gastrointestinal:  Negative for abdominal pain, constipation, diarrhea, nausea and  vomiting.  Genitourinary:  Negative for dysuria, frequency and urgency.  Musculoskeletal:  Negative for myalgias.  Neurological:  Negative for dizziness, weakness and  headaches.  Psychiatric/Behavioral:  Negative for confusion.   All other systems reviewed and are negative.   Physical Exam Triage Vital Signs ED Triage Vitals  Enc Vitals Group     BP 05/19/21 1139 (!) 149/91     Pulse Rate 05/19/21 1139 63     Resp 05/19/21 1139 18     Temp 05/19/21 1139 98.6 F (37 C)     Temp Source 05/19/21 1139 Oral     SpO2 05/19/21 1139 97 %     Weight --      Height --      Head Circumference --      Peak Flow --      Pain Score 05/19/21 1138 10     Pain Loc --      Pain Edu? --      Excl. in [place]? --    No data found.  Updated Vital Signs BP (!) 149/91 (BP Location: Right Arm)    Pulse 63    Temp 98.6 F (37 C) (Oral)    Resp 18    SpO2 97%   Visual Acuity Right Eye Distance:   Left Eye Distance:   Bilateral Distance:    Right Eye Near:   Left Eye Near:    Bilateral Near:     Physical Exam Vitals reviewed.  Constitutional:      General: She is not in acute distress.    Appearance: Normal appearance. She is not ill-appearing or diaphoretic.  HENT:     Head: Normocephalic and atraumatic.     Ears:     Comments: Decreased hearing bilaterally. Bilateral canals with effusion and exudate, unable to visualize TM. Exudate visible bilaterally R>L. Cardiovascular:     Rate and Rhythm: Normal rate and regular rhythm.     Heart sounds: Normal heart sounds.  Pulmonary:     Effort: Pulmonary effort is normal.     Breath sounds: Normal breath sounds.  Skin:    General: Skin is warm.  Neurological:     General: No focal deficit present.     Mental Status: She is alert and oriented to person, place, and time.  Psychiatric:        Mood and Affect: Mood normal.        Behavior: Behavior normal.        Thought Content: Thought content normal.        Judgment: Judgment normal.     UC Treatments / Results  Labs (all labs ordered are listed, but only abnormal results are displayed) Labs Reviewed - No data to display  EKG   Radiology No  results found.  Procedures Procedures (including critical care time)  Medications Ordered in UC Medications - No data to display  Initial Impression / Assessment and Plan / UC Course  I have reviewed the triage vital signs and the nursing notes.  Pertinent labs & imaging results that were available during my care of the patient were reviewed by me and considered in my medical decision making (see chart for details).     This patient is a very pleasant 55 y.o. year old female presenting with bilateral otitis externa. Afebrile, nontachy. Unable to visualize TMs. Given extent of infection will manage with augmentin PO and ofloxacin drops. Tylenol sent at patient request. Clean dry ear precautions. ED return precautions discussed. Patient  verbalizes understanding and agreement.     Final Clinical Impressions(s) / UC Diagnoses   Final diagnoses:  Acute swimmer's ear of both sides     Discharge Instructions      -Start the antibiotic-Augmentin (amoxicillin-clavulanate), 1 pill every 12 hours for 7 days.  You can take this with food like with breakfast and dinner. -Ofloxacin drops both ears twice daily x7 days. Keep your ears dry x7 days while you heal! -Tylenol as needed -Follow-up if new/worsening symptoms like shortness of breath, chest pain, new fevers/chills    ED Prescriptions     Medication Sig Dispense Auth. Provider   amoxicillin-clavulanate (AUGMENTIN) 875-125 MG tablet Take 1 tablet by mouth every 12 (twelve) hours. 14 tablet Hazel Sams, PA-C   ofloxacin (FLOXIN) 0.3 % OTIC solution Place 3 drops into both ears 2 (two) times daily for 7 days. 3 mL Hazel Sams, PA-C   acetaminophen (TYLENOL) 500 MG tablet Take 1 tablet (500 mg total) by mouth every 6 (six) hours as needed. 30 tablet Hazel Sams, PA-C      PDMP not reviewed this encounter.   Hazel Sams, PA-C 05/19/21 1206

## 2021-05-26 ENCOUNTER — Ambulatory Visit: Payer: Medicaid Other | Admitting: Internal Medicine

## 2021-05-26 NOTE — Progress Notes (Deleted)
Patient Care Team: Charlott Rakes, MD as PCP - General (Family Medicine) Deboraha Sprang, MD as PCP - Cardiology (Cardiology) Deboraha Sprang, MD as PCP - Electrophysiology (Cardiology)   HPI  Chelsea Branch is a 56 y.o. female seen in followup for his nonischemic and presumed peripartum cardiomyopathy. She had recovery of LV function most recently in 2008 with recurring deterioration and recovery. (See Below)     She has significant hypertension.   Weight up about -40 lbs 2017>> 12/21  DATE TEST EF   2008 Echo  Normal    2015 Echo Normal << 25-30%   1/17 Cath Normal CA   1/17 Echo  25-30 %   1/18 Echo  30-35%   2/20  Echo    60-65%         Date Cr K Mg  10/18 0.83 3.5    5/21 1.03 3.7 1.4              Past Medical History:  Diagnosis Date   Abnormal thyroid blood test 03/15/2017   Abnormal uterine bleeding    Alopecia    Anemia    Angina pectoris with normal coronary arteriogram ([place]) 2017   Had + Troponin c/w ? 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 iniatially ?2/2 peripartum in 1994 - improved by 2008 then worsening EF in 2011 back down to EF 25-30%. b. echo 01/21/14 showed mod LVH, [name]; 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   Peripartum cardiomyopathy 1994   Sleep apnea 2015   CPAP 12/2013   Stroke ([place]) 0102   Systolic and diastolic CHF, acute on chronic ([place]) 06/14/2015   Tobacco abuse     Past Surgical History:  Procedure Laterality Date   CARDIAC  CATHETERIZATION N/A 06/16/2015   Procedure: Left Heart Cath and Coronary Angiography;  Surgeon: Jettie Booze, MD;  Location: [facility];  Service: Cardiovascular;  Laterality: N/A;   CESAREAN SECTION  1992  1994   LOOP RECORDER IMPLANT  ~ [place]     TUBAL LIGATION  1994    Current Outpatient Medications  Medication Sig Dispense Refill   acetaminophen (TYLENOL) 500 MG tablet Take 1 tablet (500 mg total) by mouth every 6 (six) hours as needed. 30 tablet 0   albuterol (VENTOLIN HFA) 108 (90 Base) MCG/ACT inhaler Inhale 1-2 puffs into the lungs every 6 (six) hours as needed for wheezing or shortness of breath. 18 g 2   amoxicillin-clavulanate (AUGMENTIN) 875-125 MG tablet Take 1 tablet by mouth every 12 (twelve) hours. 14 tablet 0   aspirin 81 MG EC tablet Take 1 tablet (81 mg total) by mouth daily. 30 tablet 0   cetirizine (ZYRTEC) 10 MG tablet Take 1 tablet (10 mg total) by mouth daily. 90 tablet 0   diclofenac Sodium (VOLTAREN) 1 % GEL Apply 4 g topically 4 (four) times daily. 100 g 0   dicyclomine (BENTYL) 20  MG tablet Take 1 tablet (20 mg total) by mouth 2 (two) times daily. 20 tablet 0   diphenhydrAMINE (BENADRYL) 25 MG tablet Take 1 tablet (25 mg total) by mouth every 6 (six) hours. 20 tablet 0   fluticasone (FLONASE) 50 MCG/ACT nasal spray Place 2 sprays into both nostrils daily. 16 g 0   guaiFENesin (MUCINEX) 600 MG 12 hr tablet Take 1 tablet (600 mg total) by mouth 2 (two) times daily as needed. 30 tablet 0   ibuprofen (ADVIL) 800 MG tablet Take 1 tablet (800 mg total) by mouth 3 (three) times daily. 21 tablet 1   magnesium oxide (MAG-OX) 400 MG tablet TAKE 1 CAPSULE (400 MG TOTAL) BY MOUTH 2 (TWO) TIMES DAILY. 180 tablet 2   Menthol (CEPACOL SORE THROAT) 5.4 MG LOZG Use as directed 1 lozenge (5.4 mg total) in the mouth or throat every 2 (two) hours as needed. 30 lozenge 0   metoprolol succinate (TOPROL-XL) 50 MG 24 hr tablet Take 1 tablet (50 mg  total) by mouth daily. Please make yearly appt with Dr. Caryl Comes for December 2022 for future refills. Thank you 1st attempt 90 tablet 0   metroNIDAZOLE (FLAGYL) 500 MG tablet Take 1 tablet (500 mg total) by mouth 2 (two) times daily. 14 tablet 0   Misc. Devices MISC Please provide patient with insurance approved straight cane 1 each 0   Misc. Devices MISC Single-point cane with adjustable height x1, shower chair x1, transfer bench x1.  Diagnosis congestive heart failure, degenerative disc disease of the lumbar spine. 1 each 0   mupirocin ointment (BACTROBAN) 2 % Apply 1 application topically 2 (two) times daily. 30 g 0   ofloxacin (FLOXIN) 0.3 % OTIC solution Place 3 drops into both ears 2 (two) times daily for 7 days. 3 mL 0   ondansetron (ZOFRAN ODT) 4 MG disintegrating tablet Take 1 tablet (4 mg total) by mouth every 8 (eight) hours as needed for nausea or vomiting. 20 tablet 0   ondansetron (ZOFRAN) 4 MG tablet Take 1 tablet (4 mg total) by mouth every 6 (six) hours. 12 tablet 0   potassium chloride SA (KLOR-CON M20) 20 MEQ tablet TAKE 1-2 TABS BY MOUTH AS DIRECTED. TAKE 2 TABLETS IN THE MORNING AND 1 TABLET IN THE EVENING 270 tablet 3   promethazine-dextromethorphan (PROMETHAZINE-DM) 6.25-15 MG/5ML syrup Take 5 mLs by mouth 4 (four) times daily as needed for cough. 118 mL 0   promethazine-dextromethorphan (PROMETHAZINE-DM) 6.25-15 MG/5ML syrup Take 5 mLs by mouth 4 (four) times daily as needed for cough. 100 mL 0   pseudoephedrine (SUDAFED) 30 MG tablet Take 1 tablet (30 mg total) by mouth every 8 (eight) hours as needed for congestion. 30 tablet 0   sacubitril-valsartan (ENTRESTO) 24-26 MG Take 1 tablet by mouth 2 (two) times daily. >>SEE DIRECTIONS AS YOUR DOSE HAS CHANGED<< 180 tablet 0   simvastatin (ZOCOR) 10 MG tablet TAKE 1 TABLET BY MOUTH EVERY DAY IN THE EVENING 90 tablet 1   spironolactone (ALDACTONE) 25 MG tablet Take 0.5 tablets (12.5 mg total) by mouth daily. 90 tablet 1   tiZANidine  (ZANAFLEX) 4 MG tablet Take 1 tablet (4 mg total) by mouth every 8 (eight) hours as needed for muscle spasms. DO NOT DRINK ALCOHOL OR DRIVE WHILE TAKING THIS MEDICATION 12 tablet 0   torsemide (DEMADEX) 20 MG tablet Take 1 tablet (20 mg total) by mouth daily. 90 tablet 3   triamcinolone cream (KENALOG) 0.1 % Apply 1 application topically 2 (  two) times daily. 30 g 0   No current facility-administered medications for this visit.    Allergies  Allergen Reactions   Ace Inhibitors Other (See Comments)    REACTION: Cough    Review of Systems negative except from HPI and PMH  Physical Exam There were no vitals taken for this visit. Well developed and nourished in no acute distress HENT normal Neck supple with JVP-  flat *** Clear Regular rate and rhythm, no murmurs or gallops Abd-soft with active BS No Clubbing cyanosis edema Skin-warm and dry A & Oriented  Grossly normal sensory and motor function  ECG ***   Assessment and  Plan Nonischemic cardiomyopathy relapsing  Congestive heart failure chronic  Hypertension-resolved  Lightheadedness   Obstructive sleep apnea AHI 18   Hypomagnesemia      with lightheadedness and BP will reduce metoprolol 75--25 Entresto 49/51--24/26  We will repeat her LV EF assessment next year.  Euvolemic.  Dyspnea I think is now largely related to significant interval weight gain.  TSH was normal 18 months ago.  Encouraged her with weight loss and discussed a low carbohydrate diet

## 2021-05-27 ENCOUNTER — Other Ambulatory Visit: Payer: Self-pay | Admitting: Internal Medicine

## 2021-05-28 NOTE — Telephone Encounter (Signed)
°*  STAT* If patient is at the pharmacy, call can be transferred to refill team.   1. Which medications need to be refilled? (please list name of each medication and dose if known) Spironolactone  2. Which pharmacy/location (including street and city if local pharmacy) is medication to be sent to?[facility]  [place], [place]  3. Do they need a 30 day or 90 day supply? 30 days-Pt is out of  her medicine, please call today if possible

## 2021-05-30 ENCOUNTER — Other Ambulatory Visit: Payer: Self-pay | Admitting: Physician Assistant

## 2021-05-31 ENCOUNTER — Other Ambulatory Visit: Payer: Self-pay

## 2021-05-31 ENCOUNTER — Encounter (HOSPITAL_COMMUNITY): Payer: Self-pay | Admitting: *Deleted

## 2021-05-31 ENCOUNTER — Emergency Department (HOSPITAL_COMMUNITY): Payer: Medicaid Other

## 2021-05-31 ENCOUNTER — Ambulatory Visit (HOSPITAL_COMMUNITY)
Admission: EM | Admit: 2021-05-31 | Discharge: 2021-05-31 | Disposition: A | Discharge status: Home / Self Care | Payer: Medicaid Other | Attending: Urgent Care | Admitting: Urgent Care

## 2021-05-31 ENCOUNTER — Emergency Department (HOSPITAL_COMMUNITY)
Admission: EM | Admit: 2021-05-31 | Discharge: 2021-05-31 | Disposition: A | Discharge status: Home / Self Care | Payer: Medicaid Other | Attending: Emergency Medicine | Admitting: Emergency Medicine

## 2021-05-31 DIAGNOSIS — Z8709 Personal history of other diseases of the respiratory system: Secondary | ICD-10-CM

## 2021-05-31 DIAGNOSIS — E1159 Type 2 diabetes mellitus with other circulatory complications: Secondary | ICD-10-CM | POA: Diagnosis not present

## 2021-05-31 DIAGNOSIS — R0789 Other chest pain: Secondary | ICD-10-CM | POA: Diagnosis not present

## 2021-05-31 DIAGNOSIS — R0602 Shortness of breath: Secondary | ICD-10-CM | POA: Diagnosis not present

## 2021-05-31 DIAGNOSIS — I509 Heart failure, unspecified: Secondary | ICD-10-CM

## 2021-05-31 DIAGNOSIS — J101 Influenza due to other identified influenza virus with other respiratory manifestations: Secondary | ICD-10-CM

## 2021-05-31 DIAGNOSIS — Z20822 Contact with and (suspected) exposure to covid-19: Secondary | ICD-10-CM | POA: Diagnosis not present

## 2021-05-31 DIAGNOSIS — Z7982 Long term (current) use of aspirin: Secondary | ICD-10-CM | POA: Insufficient documentation

## 2021-05-31 DIAGNOSIS — E119 Type 2 diabetes mellitus without complications: Secondary | ICD-10-CM

## 2021-05-31 DIAGNOSIS — R519 Headache, unspecified: Secondary | ICD-10-CM | POA: Diagnosis not present

## 2021-05-31 DIAGNOSIS — R079 Chest pain, unspecified: Secondary | ICD-10-CM | POA: Diagnosis not present

## 2021-05-31 DIAGNOSIS — Z79899 Other long term (current) drug therapy: Secondary | ICD-10-CM | POA: Diagnosis not present

## 2021-05-31 DIAGNOSIS — R059 Cough, unspecified: Secondary | ICD-10-CM | POA: Diagnosis not present

## 2021-05-31 DIAGNOSIS — R052 Subacute cough: Secondary | ICD-10-CM

## 2021-05-31 LAB — RESP PANEL BY RT-PCR (FLU A&B, COVID) ARPGX2
Influenza A by PCR: POSITIVE — AB
Influenza B by PCR: NEGATIVE
SARS Coronavirus 2 by RT PCR: NEGATIVE

## 2021-05-31 MED ORDER — PROMETHAZINE-DM 6.25-15 MG/5ML PO SYRP: 5.0000 mL | ORAL_SOLUTION | Freq: Four times a day (QID) | ORAL | 0 refills | Status: DC | PRN

## 2021-05-31 MED ORDER — PSEUDOEPHEDRINE HCL 30 MG PO TABS: 30.0000 mg | ORAL_TABLET | Freq: Three times a day (TID) | ORAL | 0 refills | Status: DC | PRN

## 2021-05-31 MED ORDER — OSELTAMIVIR PHOSPHATE 75 MG PO CAPS: 75.0000 mg | ORAL_CAPSULE | Freq: Two times a day (BID) | ORAL | 0 refills | Status: DC

## 2021-05-31 MED ORDER — ACETAMINOPHEN 325 MG PO TABS: 975.0000 mg | ORAL_TABLET | Freq: Once | ORAL | Status: DC

## 2021-05-31 MED ORDER — ALBUTEROL SULFATE HFA 108 (90 BASE) MCG/ACT IN AERS
1.0000 | INHALATION_SPRAY | Freq: Four times a day (QID) | RESPIRATORY_TRACT | 2 refills | Status: DC | PRN
Start: 2021-05-31 — End: 2021-07-21

## 2021-05-31 MED ORDER — ACETAMINOPHEN 325 MG PO TABS
ORAL_TABLET | ORAL | Status: AC
  Filled 2021-05-31: qty 1

## 2021-05-31 MED ORDER — CETIRIZINE HCL 10 MG PO TABS: 10.0000 mg | ORAL_TABLET | Freq: Every day | ORAL | 0 refills | Status: DC

## 2021-05-31 MED ORDER — ACETAMINOPHEN 325 MG PO TABS
325.0000 mg | ORAL_TABLET | Freq: Once | ORAL | Status: AC
  Administered 2021-05-31: 325 mg via ORAL

## 2021-05-31 MED ORDER — ACETAMINOPHEN 500 MG PO TABS
1000.0000 mg | ORAL_TABLET | Freq: Once | ORAL | Status: AC
  Administered 2021-05-31: 1000 mg via ORAL
  Filled 2021-05-31: qty 2

## 2021-05-31 MED ORDER — IBUPROFEN 800 MG PO TABS: 800.0000 mg | ORAL_TABLET | Freq: Three times a day (TID) | ORAL | 0 refills | Status: DC

## 2021-05-31 MED ORDER — ALBUTEROL SULFATE HFA 108 (90 BASE) MCG/ACT IN AERS: 2.0000 | INHALATION_SPRAY | RESPIRATORY_TRACT | Status: DC | PRN

## 2021-05-31 MED ORDER — GUAIFENESIN-CODEINE 100-10 MG/5ML PO SYRP: 5.0000 mL | ORAL_SOLUTION | Freq: Three times a day (TID) | ORAL | 0 refills | Status: DC | PRN

## 2021-05-31 NOTE — ED Provider Notes (Signed)
[place]   MRN: [id number] DOB: Nov 06, 1965  Subjective:   Chelsea Branch is a 56 y.o. female presenting for a recheck for ongoing fever, malaise, fatigue, cough, shortness of breath. Patient was seen through the ER just hours ago. Was given ibuprofen there and advised to use supportive care. She comes in for the same symptoms and a refill for her albuterol. Has a history of asthma. History of CHF, heart disease, NSTEMI, type 2 diabetes treated without insulin.  Patient is non-smoker.  No current facility-administered medications for this encounter.  Current Outpatient Medications:    acetaminophen (TYLENOL) 500 MG tablet, Take 1 tablet (500 mg total) by mouth every 6 (six) hours as needed., Disp: 30 tablet, Rfl: 0   amoxicillin-clavulanate (AUGMENTIN) 875-125 MG tablet, Take 1 tablet by mouth every 12 (twelve) hours., Disp: 14 tablet, Rfl: 0   diclofenac Sodium (VOLTAREN) 1 % GEL, Apply 4 g topically 4 (four) times daily., Disp: 100 g, Rfl: 0   dicyclomine (BENTYL) 20 MG tablet, Take 1 tablet (20 mg total) by mouth 2 (two) times daily., Disp: 20 tablet, Rfl: 0   diphenhydrAMINE (BENADRYL) 25 MG tablet, Take 1 tablet (25 mg total) by mouth every 6 (six) hours., Disp: 20 tablet, Rfl: 0   fluticasone (FLONASE) 50 MCG/ACT nasal spray, Place 2 sprays into both nostrils daily., Disp: 16 g, Rfl: 0   guaiFENesin (MUCINEX) 600 MG 12 hr tablet, Take 1 tablet (600 mg total) by mouth 2 (two) times daily as needed., Disp: 30 tablet, Rfl: 0   magnesium oxide (MAG-OX) 400 MG tablet, TAKE 1 CAPSULE (400 MG TOTAL) BY MOUTH 2 (TWO) TIMES DAILY., Disp: 180 tablet, Rfl: 2   metoprolol succinate (TOPROL-XL) 50 MG 24 hr tablet, Take 1 tablet (50 mg total) by mouth daily. Please make yearly appt with Dr. Caryl Comes for December 2022 for future refills. Thank you 1st attempt, Disp: 90 tablet, Rfl: 0   potassium chloride SA (KLOR-CON M20) 20 MEQ tablet, TAKE 1-2 TABS BY MOUTH AS DIRECTED. TAKE 2 TABLETS  IN THE MORNING AND 1 TABLET IN THE EVENING, Disp: 270 tablet, Rfl: 3   sacubitril-valsartan (ENTRESTO) 24-26 MG, Take 1 tablet by mouth 2 (two) times daily. >>SEE DIRECTIONS AS YOUR DOSE HAS CHANGED<<, Disp: 180 tablet, Rfl: 0   simvastatin (ZOCOR) 10 MG tablet, TAKE 1 TABLET BY MOUTH EVERY DAY IN THE EVENING, Disp: 90 tablet, Rfl: 1   spironolactone (ALDACTONE) 25 MG tablet, TAKE 1/2 TABLET BY MOUTH EVERY DAY, Disp: 15 tablet, Rfl: 0   tiZANidine (ZANAFLEX) 4 MG tablet, Take 1 tablet (4 mg total) by mouth every 8 (eight) hours as needed for muscle spasms. DO NOT DRINK ALCOHOL OR DRIVE WHILE TAKING THIS MEDICATION, Disp: 12 tablet, Rfl: 0   torsemide (DEMADEX) 20 MG tablet, Take 1 tablet (20 mg total) by mouth daily., Disp: 90 tablet, Rfl: 3   albuterol (VENTOLIN HFA) 108 (90 Base) MCG/ACT inhaler, Inhale 1-2 puffs into the lungs every 6 (six) hours as needed for wheezing or shortness of breath., Disp: 18 g, Rfl: 2   aspirin 81 MG EC tablet, Take 1 tablet (81 mg total) by mouth daily., Disp: 30 tablet, Rfl: 0   cetirizine (ZYRTEC) 10 MG tablet, Take 1 tablet (10 mg total) by mouth daily., Disp: 90 tablet, Rfl: 0   guaiFENesin-codeine (ROBITUSSIN AC) 100-10 MG/5ML syrup, Take 5 mLs by mouth 3 (three) times daily as needed for cough., Disp: 120 mL, Rfl: 0   ibuprofen (ADVIL)  800 MG tablet, Take 1 tablet (800 mg total) by mouth 3 (three) times daily., Disp: 21 tablet, Rfl: 1   ibuprofen (ADVIL) 800 MG tablet, Take 1 tablet (800 mg total) by mouth 3 (three) times daily., Disp: 21 tablet, Rfl: 0   Menthol (CEPACOL SORE THROAT) 5.4 MG LOZG, Use as directed 1 lozenge (5.4 mg total) in the mouth or throat every 2 (two) hours as needed., Disp: 30 lozenge, Rfl: 0   metroNIDAZOLE (FLAGYL) 500 MG tablet, Take 1 tablet (500 mg total) by mouth 2 (two) times daily., Disp: 14 tablet, Rfl: 0   Misc. Devices MISC, Please provide patient with insurance approved straight cane, Disp: 1 each, Rfl: 0   Misc. Devices MISC,  Single-point cane with adjustable height x1, shower chair x1, transfer bench x1.  Diagnosis congestive heart failure, degenerative disc disease of the lumbar spine., Disp: 1 each, Rfl: 0   mupirocin ointment (BACTROBAN) 2 %, Apply 1 application topically 2 (two) times daily., Disp: 30 g, Rfl: 0   ondansetron (ZOFRAN ODT) 4 MG disintegrating tablet, Take 1 tablet (4 mg total) by mouth every 8 (eight) hours as needed for nausea or vomiting., Disp: 20 tablet, Rfl: 0   ondansetron (ZOFRAN) 4 MG tablet, Take 1 tablet (4 mg total) by mouth every 6 (six) hours., Disp: 12 tablet, Rfl: 0   promethazine-dextromethorphan (PROMETHAZINE-DM) 6.25-15 MG/5ML syrup, Take 5 mLs by mouth 4 (four) times daily as needed for cough., Disp: 118 mL, Rfl: 0   promethazine-dextromethorphan (PROMETHAZINE-DM) 6.25-15 MG/5ML syrup, Take 5 mLs by mouth 4 (four) times daily as needed for cough., Disp: 100 mL, Rfl: 0   pseudoephedrine (SUDAFED) 30 MG tablet, Take 1 tablet (30 mg total) by mouth every 8 (eight) hours as needed for congestion., Disp: 30 tablet, Rfl: 0   triamcinolone cream (KENALOG) 0.1 %, Apply 1 application topically 2 (two) times daily., Disp: 30 g, Rfl: 0   Allergies  Allergen Reactions   Ace Inhibitors Other (See Comments)    REACTION: Cough    Past Medical History:  Diagnosis Date   Abnormal thyroid blood test 03/15/2017   Abnormal uterine bleeding    Alopecia    Anemia    Angina pectoris with normal coronary arteriogram ([place]) 2017   Had + Troponin c/w ? 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 iniatially ?2/2 peripartum in 1994 - improved by 2008 then worsening EF  in 2011 back down to EF 25-30%. b. echo 01/21/14 showed mod LVH, [name]; 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   Peripartum cardiomyopathy 1994   Sleep apnea 2015   CPAP 12/2013   Stroke ([place]) 0981   Systolic and diastolic CHF, acute on chronic ([place]) 06/14/2015   Tobacco abuse      Past Surgical History:  Procedure Laterality Date   CARDIAC CATHETERIZATION N/A 06/16/2015   Procedure: Left Heart Cath and Coronary Angiography;  Surgeon: Jettie Booze, MD;  Location: [facility];  Service:  Cardiovascular;  Laterality: N/A;   CESAREAN SECTION  1992  1994   LOOP RECORDER IMPLANT  ~ 2000   TIBIAL TUBERCLERPLASTY     TUBAL LIGATION  1994    Family History  Problem Relation Age of Onset   Cancer Maternal Grandmother        uterine   Hypertension Sister    Healthy Mother    Other Neg Hx    Heart disease Neg Hx     Social History   Tobacco Use   Smoking status: Former    Packs/day: 0.10    Years: 30.00    Pack years: 3.00    Types: Cigarettes    Quit date: 06/03/2015    Years since quitting: 5.9   Smokeless tobacco: Never   Tobacco comments:    Pt. stated she stopped smoking a year ago. 09/29/2018  Vaping Use   Vaping Use: Never used  Substance Use Topics   Alcohol use: No    Alcohol/week: 0.0 standard drinks   Drug use: No    ROS   Objective:   Vitals: BP (!) 149/83    Pulse (!) 102    Temp (!) 100.7 F (38.2 C) (Oral)    Resp (!) 24    SpO2 97%   Physical Exam Constitutional:      General: She is not in acute distress.    Appearance: Normal appearance. She is well-developed. She is ill-appearing. She is not toxic-appearing or diaphoretic.  HENT:     Head: Normocephalic and atraumatic.     Nose: Nose normal.     Mouth/Throat:     Mouth: Mucous membranes are moist.  Eyes:     Extraocular Movements:  Extraocular movements intact.  Cardiovascular:     Rate and Rhythm: Regular rhythm. Tachycardia present.     Pulses: Normal pulses.     Heart sounds: Normal heart sounds. No murmur heard.   No friction rub. No gallop.     Comments: Borderline tachycardic. Pulmonary:     Effort: Pulmonary effort is normal. No respiratory distress.     Breath sounds: Normal breath sounds. No stridor. No decreased breath sounds, wheezing, rhonchi or rales.  Skin:    General: Skin is warm and dry.     Findings: No rash.  Neurological:     Mental Status: She is alert and oriented to person, place, and time.  Psychiatric:        Mood and Affect: Mood normal.        Behavior: Behavior normal.        Thought Content: Thought content normal.    Results for orders placed or performed during the hospital encounter of 05/31/21 (from the past 24 hour(s))  Resp Panel by RT-PCR (Flu A&B, Covid) Nasopharyngeal Swab     Status: Abnormal   Collection Time: 05/31/21  6:48 AM   Specimen: Nasopharyngeal Swab; Nasopharyngeal(NP) swabs in vial transport medium  Result Value Ref Range   SARS Coronavirus 2 by RT PCR NEGATIVE NEGATIVE   Influenza A by PCR POSITIVE (A) NEGATIVE   Influenza B by PCR NEGATIVE NEGATIVE   DG Chest 2 View  Result Date: 05/31/2021 CLINICAL DATA:  Pt reported to ED with c/o numerous complaints including cough, ShOB, Chest pain, generalized body aches and headache. Reports known recent sick contact and states she could not sleep tonight d/t symptoms. EXAM: CHEST - 2 VIEW COMPARISON:  03/03/2020. FINDINGS: Cardiac silhouette is normal in size. Normal mediastinal and hilar contours. Prominent vascular  markings, stable. Lungs otherwise clear. No pleural effusion or pneumothorax. Skeletal structures are intact. IMPRESSION: No active cardiopulmonary disease. Electronically Signed   By: Lajean Manes M.D.   On: 05/31/2021 08:29    Assessment and Plan :   PDMP not reviewed this encounter.  1. Influenza A    2. Encounter for laboratory testing for COVID-19 virus   3. Subacute cough   4. Shortness of breath   5. History of asthma   6. Chronic congestive heart failure, unspecified heart failure type ([place])   7. Type 2 diabetes mellitus treated without insulin ([facility])    Will not repeat chest x-ray given that this was done just hours ago.  500 mg Tylenol given in clinic.  Recommended continued supportive care for influenza.  Given her significant risk factors of congestive heart failure, type 2 diabetes, history of asthma recommended Tamiflu. Counseled patient on potential for adverse effects with medications prescribed/recommended today, ER and return-to-clinic precautions discussed, patient verbalized understanding.    Jaynee Eagles, [place] 05/31/21 1358

## 2021-05-31 NOTE — ED Provider Notes (Signed)
[facility] EMERGENCY DEPARTMENT Provider Note   CSN: [id number] Arrival date & time: 05/31/21  0554     History  Chief Complaint  Patient presents with   Shortness of Breath   Cough   Generalized Body Aches    Chelsea Branch is a 56 y.o. female who presents to the emergency department complaining of cough, shortness of breath, chest pain, generalized body aches, and headache for 2 days.  She states her pain is related to coughing. patient reports known recent sick contact.  She states she could not sleep last night due to her symptoms.  She has not taken anything for her symptoms.   Shortness of Breath Associated symptoms: chest pain, cough, fever and headaches   Associated symptoms: no abdominal pain, no neck pain, no sore throat, no vomiting and no wheezing   Cough Associated symptoms: chest pain, chills, fever, headaches, myalgias and shortness of breath   Associated symptoms: no sore throat and no wheezing       Home Medications Prior to Admission medications   Medication Sig Start Date End Date Taking? Authorizing Provider  guaiFENesin-codeine (ROBITUSSIN AC) 100-10 MG/5ML syrup Take 5 mLs by mouth 3 (three) times daily as needed for cough. 05/31/21  Yes Tonia Avino T, PA-C  ibuprofen (ADVIL) 800 MG tablet Take 1 tablet (800 mg total) by mouth 3 (three) times daily. 05/31/21  Yes Antoine Vandermeulen T, PA-C  acetaminophen (TYLENOL) 500 MG tablet Take 1 tablet (500 mg total) by mouth every 6 (six) hours as needed. 05/19/21   Hazel Sams, PA-C  albuterol (VENTOLIN HFA) 108 (90 Base) MCG/ACT inhaler Inhale 1-2 puffs into the lungs every 6 (six) hours as needed for wheezing or shortness of breath. 06/07/20   Vanessa Kick, MD  amoxicillin-clavulanate (AUGMENTIN) 875-125 MG tablet Take 1 tablet by mouth every 12 (twelve) hours. 05/19/21   Hazel Sams, PA-C  aspirin 81 MG EC tablet Take 1 tablet (81 mg total) by mouth daily. 03/31/16   Milagros Loll, MD   cetirizine (ZYRTEC) 10 MG tablet Take 1 tablet (10 mg total) by mouth daily. 08/19/20   Jaynee Eagles, PA-C  diclofenac Sodium (VOLTAREN) 1 % GEL Apply 4 g topically 4 (four) times daily. 01/05/21   Pearson Forster, NP  dicyclomine (BENTYL) 20 MG tablet Take 1 tablet (20 mg total) by mouth 2 (two) times daily. 02/23/21   Marney Setting, NP  diphenhydrAMINE (BENADRYL) 25 MG tablet Take 1 tablet (25 mg total) by mouth every 6 (six) hours. 09/13/19   Loura Halt A, NP  fluticasone (FLONASE) 50 MCG/ACT nasal spray Place 2 sprays into both nostrils daily. 08/19/20   Jaynee Eagles, PA-C  guaiFENesin (MUCINEX) 600 MG 12 hr tablet Take 1 tablet (600 mg total) by mouth 2 (two) times daily as needed. 10/14/20   Volney American, PA-C  ibuprofen (ADVIL) 800 MG tablet Take 1 tablet (800 mg total) by mouth 3 (three) times daily. 06/24/20   Hazel Sams, PA-C  magnesium oxide (MAG-OX) 400 MG tablet TAKE 1 CAPSULE (400 MG TOTAL) BY MOUTH 2 (TWO) TIMES DAILY. 01/22/20   Deboraha Sprang, MD  Menthol (CEPACOL SORE THROAT) 5.4 MG LOZG Use as directed 1 lozenge (5.4 mg total) in the mouth or throat every 2 (two) hours as needed. 02/11/20   Darr, Edison Nasuti, PA-C  metoprolol succinate (TOPROL-XL) 50 MG 24 hr tablet Take 1 tablet (50 mg total) by mouth daily. Please make yearly appt with Dr. Caryl Comes  for December 2022 for future refills. Thank you 1st attempt 02/23/21   Deboraha Sprang, MD  metroNIDAZOLE (FLAGYL) 500 MG tablet Take 1 tablet (500 mg total) by mouth 2 (two) times daily. 02/25/21   LampteyMyrene Galas, MD  Misc. Devices MISC Please provide patient with insurance approved straight cane 09/29/18   Gildardo Pounds, NP  Misc. Devices MISC Single-point cane with adjustable height x1, shower chair x1, transfer bench x1.  Diagnosis congestive heart failure, degenerative disc disease of the lumbar spine. 11/23/18   Charlott Rakes, MD  mupirocin ointment (BACTROBAN) 2 % Apply 1 application topically 2 (two) times daily. 11/03/19    Wieters, Hallie C, PA-C  ondansetron (ZOFRAN ODT) 4 MG disintegrating tablet Take 1 tablet (4 mg total) by mouth every 8 (eight) hours as needed for nausea or vomiting. 09/11/20   Lamptey, Myrene Galas, MD  ondansetron (ZOFRAN) 4 MG tablet Take 1 tablet (4 mg total) by mouth every 6 (six) hours. 02/23/21   Marney Setting, NP  potassium chloride SA (KLOR-CON M20) 20 MEQ tablet TAKE 1-2 TABS BY MOUTH AS DIRECTED. TAKE 2 TABLETS IN THE MORNING AND 1 TABLET IN THE EVENING 05/09/20   Richardson Dopp T, PA-C  promethazine-dextromethorphan (PROMETHAZINE-DM) 6.25-15 MG/5ML syrup Take 5 mLs by mouth 4 (four) times daily as needed for cough. 06/07/20   Vanessa Kick, MD  promethazine-dextromethorphan (PROMETHAZINE-DM) 6.25-15 MG/5ML syrup Take 5 mLs by mouth 4 (four) times daily as needed for cough. 10/14/20   Volney American, PA-C  pseudoephedrine (SUDAFED) 30 MG tablet Take 1 tablet (30 mg total) by mouth every 8 (eight) hours as needed for congestion. 08/19/20   Jaynee Eagles, PA-C  sacubitril-valsartan (ENTRESTO) 24-26 MG Take 1 tablet by mouth 2 (two) times daily. >>SEE DIRECTIONS AS YOUR DOSE HAS CHANGED<< 12/09/20   Deboraha Sprang, MD  simvastatin (ZOCOR) 10 MG tablet TAKE 1 TABLET BY MOUTH EVERY DAY IN THE EVENING 04/14/20   Deboraha Sprang, MD  spironolactone (ALDACTONE) 25 MG tablet TAKE 1/2 TABLET BY MOUTH EVERY DAY 05/28/21   Deboraha Sprang, MD  tiZANidine (ZANAFLEX) 4 MG tablet Take 1 tablet (4 mg total) by mouth every 8 (eight) hours as needed for muscle spasms. DO NOT DRINK ALCOHOL OR DRIVE WHILE TAKING THIS MEDICATION 01/05/21   Pearson Forster, NP  torsemide (DEMADEX) 20 MG tablet Take 1 tablet (20 mg total) by mouth daily. 03/05/20   Richardson Dopp T, PA-C  triamcinolone cream (KENALOG) 0.1 % Apply 1 application topically 2 (two) times daily. 01/05/21   Pearson Forster, NP  DULoxetine (CYMBALTA) 30 MG capsule Take 1 capsule (30 mg total) by mouth daily. 12/07/18 12/10/19  Charlott Rakes, MD   famotidine (PEPCID) 20 MG tablet Take 1 tablet (20 mg total) 2 (two) times daily by mouth. 04/10/17 11/03/19  Langston Masker B, PA-C      Allergies    Ace inhibitors    Review of Systems   Review of Systems  Constitutional:  Positive for chills and fever.  HENT:  Positive for congestion. Negative for sore throat and trouble swallowing.   Respiratory:  Positive for cough and shortness of breath. Negative for wheezing.   Cardiovascular:  Positive for chest pain. Negative for leg swelling.  Gastrointestinal:  Negative for abdominal pain, constipation, diarrhea, nausea and vomiting.  Musculoskeletal:  Positive for myalgias. Negative for neck pain and neck stiffness.  Neurological:  Positive for headaches.  All other systems reviewed and are negative.  Physical  Exam Updated Vital Signs BP (!) 145/87 (BP Location: Right Arm)    Pulse 88    Temp (!) 101 F (38.3 C)    Resp 15    Ht 5\' 8"  (1.727 m)    Wt 95.7 kg    SpO2 96%    BMI 32.08 kg/m  Physical Exam Vitals and nursing note reviewed.  Constitutional:      Appearance: Normal appearance.  HENT:     Head: Normocephalic and atraumatic.  Eyes:     Conjunctiva/sclera: Conjunctivae normal.  Cardiovascular:     Rate and Rhythm: Normal rate and regular rhythm.  Pulmonary:     Effort: Pulmonary effort is normal. No respiratory distress.     Breath sounds: Normal breath sounds.  Abdominal:     General: There is no distension.     Palpations: Abdomen is soft.     Tenderness: There is no abdominal tenderness.  Skin:    General: Skin is warm and dry.  Neurological:     General: No focal deficit present.     Mental Status: She is alert.    ED Results / Procedures / Treatments   Labs (all labs ordered are listed, but only abnormal results are displayed) Labs Reviewed  RESP PANEL BY RT-PCR (FLU A&B, COVID) ARPGX2 - Abnormal; Notable for the following components:      Result Value   Influenza A by PCR POSITIVE (*)    All other  components within normal limits    EKG None  Radiology DG Chest 2 View  Result Date: 05/31/2021 CLINICAL DATA:  Pt reported to ED with c/o numerous complaints including cough, ShOB, Chest pain, generalized body aches and headache. Reports known recent sick contact and states she could not sleep tonight d/t symptoms. EXAM: CHEST - 2 VIEW COMPARISON:  03/03/2020. FINDINGS: Cardiac silhouette is normal in size. Normal mediastinal and hilar contours. Prominent vascular markings, stable. Lungs otherwise clear. No pleural effusion or pneumothorax. Skeletal structures are intact. IMPRESSION: No active cardiopulmonary disease. Electronically Signed   By: Lajean Manes M.D.   On: 05/31/2021 08:29    Procedures Procedures    Medications Ordered in ED Medications  albuterol (VENTOLIN HFA) 108 (90 Base) MCG/ACT inhaler 2 puff (has no administration in time range)  acetaminophen (TYLENOL) tablet 1,000 mg (1,000 mg Oral Given 05/31/21 4696)    ED Course/ Medical Decision Making/ A&P                           Medical Decision Making Presents to the emergency department for flulike symptoms for 2 days.  She is complaining of chest pain with coughing, with some associated shortness of breath, body aches, and headache.  She is not taking anything for her symptoms.  My exam patient is febrile to 101, given Tylenol.  She is not tachycardic, not hypoxic, no acute distress.  Her lung sounds are clear to auscultation in all fields.  She has no comorbidities that complicate my evaluation.  I have low suspicion for acute ACS.  Patient has negative for COVID-19, positive for influenza A.  Chest x-ray shows no acute cardiopulmonary abnormalities.  Patient is not requiring admission or inpatient treatment for her symptoms.  I think that her body aches, fever and chest pain is related to influenza. Patient is to be discharged with recommendation to follow up with PCP in regards to today's hospital visit. Chest pain  is not likely of cardiac or pulmonary  etiology d/t presentation, PERC negative, VSS, no tracheal deviation, no JVD or new murmur, RRR, breath sounds equal bilaterally, EKG without acute abnormalities, negative troponin, and negative CXR. Pt has been advised to return to the ED if CP becomes exertional, associated with diaphoresis or nausea, radiates to left jaw/arm, worsens or becomes concerning in any way. Pt appears reliable for follow up and is agreeable to discharge.   Final Clinical Impression(s) / ED Diagnoses Final diagnoses:  Influenza A    Rx / DC Orders ED Discharge Orders          Ordered    ibuprofen (ADVIL) 800 MG tablet  3 times daily        05/31/21 0938    guaiFENesin-codeine (ROBITUSSIN AC) 100-10 MG/5ML syrup  3 times daily PRN        05/31/21 0938           Portions of this report may have been transcribed using voice recognition software. Every effort was made to ensure accuracy; however, inadvertent computerized transcription errors may be present.    Estill Cotta 05/31/21 4827    Margette Fast, MD 05/31/21 1759

## 2021-05-31 NOTE — ED Triage Notes (Addendum)
C/O cough, runny nose, chills, red eyes, body aches onset 2 days ago with dyspnea.  States dyspnea worse when laying down.  Unsure if fevers.  Took Tylenol 500mg  @ approx 1230 today. Pt states currently taking Augmentin for ear infection.

## 2021-05-31 NOTE — ED Notes (Signed)
Pt verbalized understanding of follow-up care and prescriptions.

## 2021-05-31 NOTE — ED Triage Notes (Signed)
Pt reported to ED with c/o numerous complaints including cough, ShOB, Chest pain, generalized body aches and headache. Reports known recent sick contact and states she could not sleep tonight d/t symptoms.

## 2021-05-31 NOTE — Progress Notes (Deleted)
Cardiology Office Note Date:  05/31/2021  Patient ID:  Chelsea Branch [birth year], MRN [id number] PCP:  Charlott Rakes, MD  Cardiologist:  Dr. Caryl Comes    Chief Complaint: ***   History of Present Illness: Chelsea Branch is a 56 y.o. 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  EF waxes/wanes, last echo 2020 LVEF 60-65%  She comes in today to be seen for Dr. Caryl Comes, last seen by him Dec 2021, noted weight gain over YEARS, worsening SOB without swelling, some degree of orthostatic dizziness Meds adjusted down 2/2 orthostatic symptoms, felt to be euvolemic and SOB 2/2 weight, encouraged weight loss  Multiple ER visits since then: 05/06/20: sciatica pain 06/07/20: leg/sciatic pain, cough, headache 06/24/20 cough, congestion, sore throat 07/23/20: back/sciatic pain 08/19/20, acute rhinitis 09/11/20: food poisoning 10/14/20: viral URI 01/05/21: back pain, sciatic 02/23/21: flank pain vaginal bleeding 05/19/21: swimmers ear 05/31/21: Flu A    *** symptoms *** volume *** labs, none in epic since 2021 *** CM meds *** CPAP?   Past Medical History:  Diagnosis Date   Abnormal thyroid blood test 03/15/2017   Abnormal uterine bleeding    Alopecia    Anemia    Angina pectoris with normal coronary arteriogram ([place]) 2017   Had + Troponin c/w ? 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 iniatially ?2/2 peripartum in 1994 - improved by 2008 then worsening EF in 2011 back down to EF 25-30%. b. echo 01/21/14 showed mod LVH, [name]; 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   Peripartum cardiomyopathy 1994   Sleep apnea 2015   CPAP 12/2013   Stroke ([place]) 7116   Systolic and diastolic CHF, acute on chronic ([place]) 06/14/2015   Tobacco abuse     Past Surgical History:  Procedure Laterality Date   CARDIAC CATHETERIZATION N/A 06/16/2015   Procedure: Left Heart Cath and Coronary Angiography;  Surgeon: Jettie Booze, MD;  Location: [facility];  Service: Cardiovascular;  Laterality: N/A;   CESAREAN SECTION  1992  1994   LOOP RECORDER IMPLANT  ~ [place]     TUBAL LIGATION  1994    Current Outpatient Medications  Medication Sig Dispense Refill   acetaminophen (TYLENOL) 500 MG tablet Take 1 tablet (500 mg total) by mouth every 6 (six) hours as needed. 30 tablet 0   albuterol (VENTOLIN HFA) 108 (90 Base) MCG/ACT inhaler Inhale 1-2 puffs into the lungs every 6 (six) hours as needed for wheezing or shortness of breath. 18 g 2   amoxicillin-clavulanate (AUGMENTIN) 875-125 MG tablet Take 1 tablet by mouth every 12 (twelve) hours. 14 tablet 0   aspirin 81 MG EC tablet Take 1 tablet (81 mg total) by mouth daily. 30 tablet 0   cetirizine (ZYRTEC) 10 MG tablet Take 1 tablet (10 mg total) by mouth daily. 90 tablet 0   diclofenac Sodium (  VOLTAREN) 1 % GEL Apply 4 g topically 4 (four) times daily. 100 g 0   dicyclomine (BENTYL) 20 MG tablet Take 1 tablet (20 mg total) by mouth 2 (two) times daily. 20 tablet 0   diphenhydrAMINE (BENADRYL) 25 MG tablet Take 1 tablet (25 mg total) by mouth every 6 (six) hours. 20 tablet 0   fluticasone (FLONASE) 50 MCG/ACT nasal spray Place 2 sprays into both nostrils daily. 16 g 0   guaiFENesin (MUCINEX) 600 MG 12 hr tablet Take 1 tablet (600 mg total) by mouth 2 (two) times daily as needed. 30 tablet 0   guaiFENesin-codeine (ROBITUSSIN AC) 100-10 MG/5ML syrup Take 5 mLs  by mouth 3 (three) times daily as needed for cough. 120 mL 0   ibuprofen (ADVIL) 800 MG tablet Take 1 tablet (800 mg total) by mouth 3 (three) times daily. 21 tablet 1   ibuprofen (ADVIL) 800 MG tablet Take 1 tablet (800 mg total) by mouth 3 (three) times daily. 21 tablet 0   magnesium oxide (MAG-OX) 400 MG tablet TAKE 1 CAPSULE (400 MG TOTAL) BY MOUTH 2 (TWO) TIMES DAILY. 180 tablet 2   Menthol (CEPACOL SORE THROAT) 5.4 MG LOZG Use as directed 1 lozenge (5.4 mg total) in the mouth or throat every 2 (two) hours as needed. 30 lozenge 0   metoprolol succinate (TOPROL-XL) 50 MG 24 hr tablet Take 1 tablet (50 mg total) by mouth daily. Please make yearly appt with Dr. Caryl Comes for December 2022 for future refills. Thank you 1st attempt 90 tablet 0   metroNIDAZOLE (FLAGYL) 500 MG tablet Take 1 tablet (500 mg total) by mouth 2 (two) times daily. 14 tablet 0   Misc. Devices MISC Please provide patient with insurance approved straight cane 1 each 0   Misc. Devices MISC Single-point cane with adjustable height x1, shower chair x1, transfer bench x1.  Diagnosis congestive heart failure, degenerative disc disease of the lumbar spine. 1 each 0   mupirocin ointment (BACTROBAN) 2 % Apply 1 application topically 2 (two) times daily. 30 g 0   ondansetron (ZOFRAN ODT) 4 MG disintegrating tablet Take 1 tablet (4 mg total) by mouth every 8 (eight) hours as needed for nausea or vomiting. 20 tablet 0   ondansetron (ZOFRAN) 4 MG tablet Take 1 tablet (4 mg total) by mouth every 6 (six) hours. 12 tablet 0   potassium chloride SA (KLOR-CON M20) 20 MEQ tablet TAKE 1-2 TABS BY MOUTH AS DIRECTED. TAKE 2 TABLETS IN THE MORNING AND 1 TABLET IN THE EVENING 270 tablet 3   promethazine-dextromethorphan (PROMETHAZINE-DM) 6.25-15 MG/5ML syrup Take 5 mLs by mouth 4 (four) times daily as needed for cough. 118 mL 0   promethazine-dextromethorphan (PROMETHAZINE-DM) 6.25-15 MG/5ML syrup Take 5 mLs by mouth 4 (four) times daily as needed for  cough. 100 mL 0   pseudoephedrine (SUDAFED) 30 MG tablet Take 1 tablet (30 mg total) by mouth every 8 (eight) hours as needed for congestion. 30 tablet 0   sacubitril-valsartan (ENTRESTO) 24-26 MG Take 1 tablet by mouth 2 (two) times daily. >>SEE DIRECTIONS AS YOUR DOSE HAS CHANGED<< 180 tablet 0   simvastatin (ZOCOR) 10 MG tablet TAKE 1 TABLET BY MOUTH EVERY DAY IN THE EVENING 90 tablet 1   spironolactone (ALDACTONE) 25 MG tablet TAKE 1/2 TABLET BY MOUTH EVERY DAY 15 tablet 0   tiZANidine (ZANAFLEX) 4 MG tablet Take 1 tablet (4 mg total) by mouth every 8 (eight) hours as needed for muscle spasms. DO NOT DRINK ALCOHOL  OR DRIVE WHILE TAKING THIS MEDICATION 12 tablet 0   torsemide (DEMADEX) 20 MG tablet Take 1 tablet (20 mg total) by mouth daily. 90 tablet 3   triamcinolone cream (KENALOG) 0.1 % Apply 1 application topically 2 (two) times daily. 30 g 0   No current facility-administered medications for this visit.   Facility-Administered Medications Ordered in Other Visits  Medication Dose Route Frequency Provider Last Rate Last Admin   albuterol (VENTOLIN HFA) 108 (90 Base) MCG/ACT inhaler 2 puff  2 puff Inhalation Q2H PRN Long, Wonda Olds, MD        Allergies:   Ace inhibitors   Social History:  The patient  reports that she quit smoking about 5 years ago. Her smoking use included cigarettes. She has a 3.00 pack-year smoking history. She has never used smokeless tobacco. She reports that she does not drink alcohol and does not use drugs.   Family History:  The patient's family history includes Cancer in her maternal grandmother; Healthy in her mother; Hypertension in her sister.  ROS:  Please see the history of present illness.  All other systems are reviewed and otherwise negative.   PHYSICAL EXAM:  VS:  There were no vitals taken for this visit. BMI: There is no height or weight on file to calculate BMI. Well nourished, well developed, in no acute distress  HEENT: normocephalic,  atraumatic  Neck: no JVD, carotid bruits or masses Cardiac:  *** RRR; no significant murmurs, no rubs, or gallops Lungs:  *** CTA b/l, no wheezing, rhonchi or rales  Abd: soft, non-tender, obese MS: no deformity or atrophy Ext: ** no edema is appreciated Skin: warm and dry, no rash Neuro:  No gross deficits appreciated Psych: euthymic mood, full affect   EKG:  done today and reviewed by myself ***  07/07/2018: TTE IMPRESSIONS   1. The left ventricle has normal systolic function with an ejection  fraction of 60-65%. The cavity size was normal. There is moderately  increased left ventricular wall thickness. Left ventricular diastolic  Doppler parameters are consistent with  pseudonormalization No evidence of left ventricular regional wall motion  abnormalities.   2. The right ventricle has normal systolic function. The cavity was  normal. There is no increase in right ventricular wall thickness.   3. The mitral valve is normal in structure. No evidence of mitral valve  stenosis. No regurgitation.   4. The tricuspid valve is normal in structure.   5. The aortic valve is tricuspid no stenosis of the aortic valve.   6. The pulmonic valve was normal in structure.   7. The aortic root and ascending aorta are normal in size and structure.   8. Right atrial pressure is estimated at 3 mmHg.   9. No complete TR doppler jet so unable to estimate PA systolic pressure.    03/29/17: TTE Study Conclusions - Left ventricle: The cavity size was normal. There was mild   concentric hypertrophy. Systolic function was mildly to   moderately reduced. The estimated ejection fraction was in the   range of 40% to 45%. Diffuse hypokinesis. Doppler parameters are   consistent with abnormal left ventricular relaxation (grade 1   diastolic dysfunction). - Left atrium: The atrium was mildly dilated.  06/15/16: TTE: LVEF 30-35% 06/15/15: TTE: LVEF 25-30% 01/21/14: TTE: LVEF 50-55% 07/31/13: TTE: LVEF  25-30%  06/16/15:L LHC There is severe left ventricular systolic dysfunction. No significant CAD. LVEDP elevated.   Recent Labs: No results found for requested labs within  last 8760 hours.  No results found for requested labs within last 8760 hours.   CrCl cannot be calculated (Patient's most recent lab result is older than the maximum 21 days allowed.).   Wt Readings from Last 3 Encounters:  05/31/21 211 lb (95.7 kg)  05/02/20 227 lb (103 kg)  02/11/20 230 lb 3.2 oz (104.4 kg)     Other studies reviewed: Additional studies/records reviewed today include: summarized above  ASSESSMENT AND PLAN:  1. NICM 2. Chronic CHF LVEF most recently up to 60-65%     on BB, Entresto, lasix/K+, aldactone     ***  3. HTN     *** Looks OK, no changes  4. HLD     *** Labs are followed with her PMD per the patient     *** Not addressed today  5. OSA     *** Currently untreated, lost her machine in a move     .   Disposition:  ***  Current medicines are reviewed at length with the patient today.  The patient did not have any concerns regarding medicines.  Venetia Night, PA-C 05/31/2021 12:13 PM     [facility] [address] [place] [address] [phone] (office)  [phone] (fax)

## 2021-05-31 NOTE — Discharge Instructions (Addendum)
You were seen in the emergency department today for cough and shortness of breath.  As we discussed your influenza A test is positive.  This is a viral illness very common at this time of year, and we normally treat with over-the-counter medications.  Symptoms can last for up to a week.  You can take ibuprofen or Tylenol for pain or fever, and I recommend alternating between the 2.  Make sure that you are drinking lots of fluids and getting plenty of rest.  Please use Tylenol or ibuprofen for pain.  You may use 600 mg ibuprofen every 6 hours or 1000 mg of Tylenol every 6 hours.  You may choose to alternate between the 2.  This would be most effective.  Do not exceed 4 g of Tylenol within 24 hours.  Do not exceed 3200 mg ibuprofen within 24 hours.  Continue to monitor how you are doing, and return to the emergency department for new or worsening symptoms such as chest pain, difficulty breathing not related to coughing, fever despite medication, or persistent vomiting or diarrhea.  It was a pleasure taking care of you today and I hope you begin to feel better soon!

## 2021-06-01 ENCOUNTER — Telehealth: Payer: Self-pay

## 2021-06-01 NOTE — Telephone Encounter (Signed)
Transition Care Management Unsuccessful Follow-up Telephone Call  Date of discharge and from where:  05/31/2021 from [facility]  Attempts:  1st Attempt  Reason for unsuccessful TCM follow-up call:  Left voice message

## 2021-06-02 NOTE — Telephone Encounter (Signed)
Transition Care Management Unsuccessful Follow-up Telephone Call  Date of discharge and from where:  05/31/2021 from [facility]  Attempts:  2nd Attempt  Reason for unsuccessful TCM follow-up call:  Left voice message

## 2021-06-03 ENCOUNTER — Ambulatory Visit: Payer: Medicaid Other | Admitting: Physician Assistant

## 2021-06-04 NOTE — Telephone Encounter (Signed)
Transition Care Management Unsuccessful Follow-up Telephone Call  Date of discharge and from where:  05/31/2021 from [facility]  Attempts:  3rd Attempt  Reason for unsuccessful TCM follow-up call:  Unable to reach patient

## 2021-06-20 ENCOUNTER — Encounter (HOSPITAL_COMMUNITY): Payer: Self-pay | Admitting: *Deleted

## 2021-06-20 ENCOUNTER — Other Ambulatory Visit: Payer: Self-pay

## 2021-06-20 ENCOUNTER — Ambulatory Visit (HOSPITAL_COMMUNITY)
Admission: EM | Admit: 2021-06-20 | Discharge: 2021-06-20 | Disposition: A | Discharge status: Home / Self Care | Payer: Medicaid Other | Attending: Internal Medicine | Admitting: Internal Medicine

## 2021-06-20 ENCOUNTER — Other Ambulatory Visit: Payer: Self-pay | Admitting: Physician Assistant

## 2021-06-20 DIAGNOSIS — Z113 Encounter for screening for infections with a predominantly sexual mode of transmission: Secondary | ICD-10-CM

## 2021-06-20 DIAGNOSIS — M5010 Cervical disc disorder with radiculopathy, unspecified cervical region: Secondary | ICD-10-CM

## 2021-06-20 DIAGNOSIS — N898 Other specified noninflammatory disorders of vagina: Secondary | ICD-10-CM

## 2021-06-20 DIAGNOSIS — R051 Acute cough: Secondary | ICD-10-CM

## 2021-06-20 LAB — POCT URINALYSIS DIPSTICK, ED / UC
Bilirubin Urine: NEGATIVE
Glucose, UA: NEGATIVE mg/dL
Ketones, ur: NEGATIVE mg/dL
Leukocytes,Ua: NEGATIVE
Nitrite: NEGATIVE
Protein, ur: NEGATIVE mg/dL
Specific Gravity, Urine: 1.01 (ref 1.005–1.030)
Urobilinogen, UA: 0.2 mg/dL (ref 0.0–1.0)
pH: 5 (ref 5.0–8.0)

## 2021-06-20 MED ORDER — METHYLPREDNISOLONE SODIUM SUCC 125 MG IJ SOLR
60.0000 mg | Freq: Once | INTRAMUSCULAR | Status: AC
  Administered 2021-06-20: 60 mg via INTRAMUSCULAR

## 2021-06-20 MED ORDER — METHYLPREDNISOLONE SODIUM SUCC 125 MG IJ SOLR
INTRAMUSCULAR | Status: AC
  Filled 2021-06-20: qty 2

## 2021-06-20 NOTE — ED Provider Notes (Signed)
[place]   Chief Complaint  Patient presents with   Vaginal Discharge    SUBJECTIVE:  Chelsea Branch is a 56 y.o. female who presents to the urgent care with a complaint of vaginal discharge and odor for the past few days.  States discharge is clear.  Reports she has had unprotected sex with her boyfriend.  Denies abdominal/flank pain.  States she has only 1 partner.  Symptoms are made worse with urination.  Admits to similar symptoms in the past.  Denies fever, chills, nausea, vomiting, abdominal pain, flank pain, abnormal vaginal discharge or bleeding, hematuria.    She is also complaining of left arm pain and numbness for the past few weeks.  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  Has tried OTC medication without relief.  Reports similar symptoms in the past.  Denies chills, fever, nausea, vomiting diarrhea.  She reports she tested positive for flu a couple days ago.  States she is doing well.  Denies any precipitating or aggravating factor.  Was prescribed guaifenesin with codeine for cough.  She states she has not started that medication yet.  Denies chills, fever, nausea, vomiting and diarrhea.   LMP: No LMP recorded. (Menstrual status: IUD).  ROS: As in HPI.  All other pertinent ROS negative.     Past Medical History:  Diagnosis Date   Abnormal thyroid blood test 03/15/2017   Abnormal uterine bleeding    Alopecia    Anemia    Angina pectoris with normal coronary arteriogram ([place]) 2017   Had + Troponin c/w ? 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 iniatially ?2/2 peripartum in 1994 -  improved by 2008 then worsening EF in 2011 back down to EF 25-30%. b. echo 01/21/14 showed mod LVH, [name]; 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   Peripartum cardiomyopathy 1994   Sleep apnea 2015   CPAP 12/2013   Stroke ([place]) 1093   Systolic and diastolic CHF, acute on chronic ([place]) 06/14/2015   Tobacco abuse    Past Surgical History:  Procedure Laterality Date   CARDIAC CATHETERIZATION N/A 06/16/2015   Procedure: Left Heart Cath and Coronary Angiography;  Surgeon: Jettie Booze, MD;  Location: [facility];  Service: Cardiovascular;  Laterality: N/A;   CESAREAN SECTION  1992  1994   LOOP RECORDER IMPLANT  ~ [place]     TUBAL LIGATION  1994   Allergies  Allergen Reactions   Ace Inhibitors Other (See Comments)    REACTION: Cough   No current facility-administered medications on file prior to encounter.   Current Outpatient Medications on File Prior to Encounter  Medication Sig Dispense Refill   acetaminophen (TYLENOL) 500 MG tablet Take 1 tablet (500 mg total) by mouth every 6 (six) hours as needed. 30 tablet 0   albuterol (VENTOLIN HFA) 108 (90 Base) MCG/ACT inhaler Inhale 1-2 puffs into the lungs every 6 (  six) hours as needed for wheezing or shortness of breath. 18 g 2   amoxicillin-clavulanate (AUGMENTIN) 875-125 MG tablet Take 1 tablet by mouth every 12 (twelve) hours. 14 tablet 0   aspirin 81 MG EC tablet Take 1 tablet (81 mg total) by mouth daily. 30 tablet 0   cetirizine (ZYRTEC ALLERGY) 10 MG tablet Take 1 tablet (10 mg total) by mouth daily. 30 tablet 0   diclofenac Sodium (VOLTAREN) 1 % GEL Apply 4 g topically 4 (four) times daily. 100 g 0   dicyclomine (BENTYL) 20 MG tablet Take 1 tablet (20 mg total) by mouth 2 (two) times daily. 20 tablet 0   diphenhydrAMINE (BENADRYL) 25 MG tablet Take 1  tablet (25 mg total) by mouth every 6 (six) hours. 20 tablet 0   fluticasone (FLONASE) 50 MCG/ACT nasal spray Place 2 sprays into both nostrils daily. 16 g 0   guaiFENesin (MUCINEX) 600 MG 12 hr tablet Take 1 tablet (600 mg total) by mouth 2 (two) times daily as needed. 30 tablet 0   guaiFENesin-codeine (ROBITUSSIN AC) 100-10 MG/5ML syrup Take 5 mLs by mouth 3 (three) times daily as needed for cough. 120 mL 0   ibuprofen (ADVIL) 800 MG tablet Take 1 tablet (800 mg total) by mouth 3 (three) times daily. 21 tablet 1   ibuprofen (ADVIL) 800 MG tablet Take 1 tablet (800 mg total) by mouth 3 (three) times daily. 21 tablet 0   magnesium oxide (MAG-OX) 400 MG tablet TAKE 1 CAPSULE (400 MG TOTAL) BY MOUTH 2 (TWO) TIMES DAILY. 180 tablet 2   Menthol (CEPACOL SORE THROAT) 5.4 MG LOZG Use as directed 1 lozenge (5.4 mg total) in the mouth or throat every 2 (two) hours as needed. 30 lozenge 0   metoprolol succinate (TOPROL-XL) 50 MG 24 hr tablet Take 1 tablet (50 mg total) by mouth daily. Please make yearly appt with Dr. Caryl Comes for December 2022 for future refills. Thank you 1st attempt 90 tablet 0   metroNIDAZOLE (FLAGYL) 500 MG tablet Take 1 tablet (500 mg total) by mouth 2 (two) times daily. 14 tablet 0   Misc. Devices MISC Please provide patient with insurance approved straight cane 1 each 0   Misc. Devices MISC Single-point cane with adjustable height x1, shower chair x1, transfer bench x1.  Diagnosis congestive heart failure, degenerative disc disease of the lumbar spine. 1 each 0   mupirocin ointment (BACTROBAN) 2 % Apply 1 application topically 2 (two) times daily. 30 g 0   ondansetron (ZOFRAN ODT) 4 MG disintegrating tablet Take 1 tablet (4 mg total) by mouth every 8 (eight) hours as needed for nausea or vomiting. 20 tablet 0   ondansetron (ZOFRAN) 4 MG tablet Take 1 tablet (4 mg total) by mouth every 6 (six) hours. 12 tablet 0   oseltamivir (TAMIFLU) 75 MG capsule Take 1 capsule (75 mg total) by mouth 2  (two) times daily. 10 capsule 0   potassium chloride SA (KLOR-CON M20) 20 MEQ tablet TAKE 1-2 TABS BY MOUTH AS DIRECTED. TAKE 2 TABLETS IN THE MORNING AND 1 TABLET IN THE EVENING 270 tablet 3   promethazine-dextromethorphan (PROMETHAZINE-DM) 6.25-15 MG/5ML syrup Take 5 mLs by mouth 4 (four) times daily as needed for cough. 100 mL 0   pseudoephedrine (SUDAFED) 30 MG tablet Take 1 tablet (30 mg total) by mouth every 8 (eight) hours as needed for congestion. 30 tablet 0   sacubitril-valsartan (ENTRESTO) 24-26 MG Take 1 tablet by mouth 2 (two) times daily. >>SEE  DIRECTIONS AS YOUR DOSE HAS CHANGED<< 180 tablet 0   simvastatin (ZOCOR) 10 MG tablet TAKE 1 TABLET BY MOUTH EVERY DAY IN THE EVENING 90 tablet 1   spironolactone (ALDACTONE) 25 MG tablet TAKE 1/2 TABLET BY MOUTH EVERY DAY 15 tablet 0   tiZANidine (ZANAFLEX) 4 MG tablet Take 1 tablet (4 mg total) by mouth every 8 (eight) hours as needed for muscle spasms. DO NOT DRINK ALCOHOL OR DRIVE WHILE TAKING THIS MEDICATION 12 tablet 0   torsemide (DEMADEX) 20 MG tablet Take 1 tablet (20 mg total) by mouth daily. 90 tablet 3   triamcinolone cream (KENALOG) 0.1 % Apply 1 application topically 2 (two) times daily. 30 g 0   [DISCONTINUED] DULoxetine (CYMBALTA) 30 MG capsule Take 1 capsule (30 mg total) by mouth daily. 30 capsule 3   [DISCONTINUED] famotidine (PEPCID) 20 MG tablet Take 1 tablet (20 mg total) 2 (two) times daily by mouth. 6 tablet 0   Social History   Socioeconomic History   Marital status: Single    Spouse name: Not on file   Number of children: 3   Years of education: 12   Highest education level: Not on file  Occupational History   Occupation: unemployed  Tobacco Use   Smoking status: Former    Packs/day: 0.10    Years: 30.00    Pack years: 3.00    Types: Cigarettes    Quit date: 06/03/2015    Years since quitting: 6.0   Smokeless tobacco: Never   Tobacco comments:    Pt. stated she stopped smoking a year ago. 09/29/2018   Vaping Use   Vaping Use: Never used  Substance and Sexual Activity   Alcohol use: No    Alcohol/week: 0.0 standard drinks   Drug use: No   Sexual activity: Yes    Birth control/protection: I.U.D.    Comment: fibroids  Other Topics Concern   Not on file  Social History Narrative   Lives at home with 57 yo twins (92 and [place])   1 yo son lives outside the home   61 yo granddaughter    Social Determinants of Radio broadcast assistant Strain: Not on file  Food Insecurity: Not on file  Transportation Needs: Not on file  Physical Activity: Not on file  Stress: Not on file  Social Connections: Not on file  Intimate Partner Violence: Not on file   Family History  Problem Relation Age of Onset   Cancer Maternal Grandmother        uterine   Hypertension Sister    Healthy Mother    Other Neg Hx    Heart disease Neg Hx     OBJECTIVE:  Vitals:   06/20/21 1247  BP: (!) 145/81  Pulse: 76  Resp: 18  Temp: 98.6 F (37 C)  SpO2: 97%   Physical Exam Vitals and nursing note reviewed.  Constitutional:      General: She is not in acute distress.    Appearance: Normal appearance. She is normal weight. She is not ill-appearing, toxic-appearing or diaphoretic.  HENT:     Head: Normocephalic.  Cardiovascular:     Rate and Rhythm: Normal rate and regular rhythm.     Pulses: Normal pulses.     Heart sounds: Normal heart sounds. No murmur heard.   No friction rub. No gallop.  Pulmonary:     Effort: Pulmonary effort is normal. No respiratory distress.     Breath sounds: Normal breath sounds. No stridor.  No wheezing, rhonchi or rales.  Chest:     Chest wall: No tenderness.  Musculoskeletal:     Left upper arm: Tenderness present.  Neurological:     Mental Status: She is alert and oriented to person, place, and time.     Cranial Nerves: Cranial nerves 2-12 are intact. No cranial nerve deficit.     Sensory: Sensory deficit present.     Motor: Motor function is intact.      Coordination: Coordination is intact.     Comments: Numbness present on  Left arm     Labs Reviewed  POCT URINALYSIS DIPSTICK, ED / UC - Abnormal; Notable for the following components:      Result Value   Hgb urine dipstick TRACE (*)    All other components within normal limits  URINE CULTURE  CERVICOVAGINAL ANCILLARY ONLY    ASSESSMENT & PLAN:  1. Screening for STD (sexually transmitted disease)   2. Vaginal odor   3. Vaginal discharge   4. Acute cough   5. Cervical disc disorder with radiculopathy of cervical region     Meds ordered this encounter  Medications   methylPREDNISolone sodium succinate (SOLU-MEDROL) 125 mg/2 mL injection 60 mg    Discharge instructions  Urine culture sent.  We will call you with  abnormal results. Cervical ancillary test was completed.  We will call with abnormal result Push fluids and get plenty of rest.   Solu-Medrol IM was given for cervical radiculopathy Start and continue to take guaifenesin/codeine for cough Follow up with PCP if symptoms persists Return here or go to ER if you have any new or worsening symptoms such as fever, worsening abdominal pain, nausea/vomiting, flank pain, etc...  Outlined signs and symptoms indicating need for more acute intervention. Patient verbalized understanding. After Visit Summary given.      Emerson Monte, [place] 06/20/21 1349

## 2021-06-20 NOTE — Discharge Instructions (Addendum)
Urine culture sent.  We will call you with t abnormal results. Cervical ancillary test was completed.  We will call with abnormal result Push fluids and get plenty of rest.   Solu-Medrol IM was given for cervical radiculopathy Start and continue to take guaifenesin/codeine for cough Follow up with PCP if symptoms persists Return here or go to ER if you have any new or worsening symptoms such as fever, worsening abdominal pain, nausea/vomiting, flank pain, etc..Marland Kitchen

## 2021-06-20 NOTE — ED Triage Notes (Signed)
Pt reports a odor to her urine and Vag. Discharge.

## 2021-06-21 LAB — URINE CULTURE: Culture: <10000 — AB

## 2021-06-22 LAB — CERVICOVAGINAL ANCILLARY ONLY

## 2021-06-25 ENCOUNTER — Other Ambulatory Visit: Payer: Self-pay | Admitting: Internal Medicine

## 2021-06-27 ENCOUNTER — Other Ambulatory Visit: Payer: Self-pay | Admitting: Physician Assistant

## 2021-07-01 ENCOUNTER — Other Ambulatory Visit: Payer: Self-pay

## 2021-07-01 ENCOUNTER — Ambulatory Visit (HOSPITAL_COMMUNITY)
Admission: EM | Admit: 2021-07-01 | Discharge: 2021-07-01 | Disposition: A | Discharge status: Home / Self Care | Payer: Medicaid Other | Attending: Internal Medicine | Admitting: Internal Medicine

## 2021-07-01 ENCOUNTER — Encounter (HOSPITAL_COMMUNITY): Payer: Self-pay | Admitting: *Deleted

## 2021-07-01 DIAGNOSIS — I5022 Chronic systolic (congestive) heart failure: Secondary | ICD-10-CM

## 2021-07-01 DIAGNOSIS — J019 Acute sinusitis, unspecified: Secondary | ICD-10-CM

## 2021-07-01 DIAGNOSIS — B9689 Other specified bacterial agents as the cause of diseases classified elsewhere: Secondary | ICD-10-CM

## 2021-07-01 DIAGNOSIS — H109 Unspecified conjunctivitis: Secondary | ICD-10-CM | POA: Diagnosis not present

## 2021-07-01 MED ORDER — DOXYCYCLINE HYCLATE 100 MG PO CAPS: 100.0000 mg | ORAL_CAPSULE | Freq: Two times a day (BID) | ORAL | 0 refills | Status: DC

## 2021-07-01 MED ORDER — SIMVASTATIN 10 MG PO TABS: ORAL_TABLET | ORAL | 0 refills | Status: DC

## 2021-07-01 MED ORDER — POLYMYXIN B-TRIMETHOPRIM 10000-0.1 UNIT/ML-% OP SOLN: 1.0000 [drp] | OPHTHALMIC | 0 refills | Status: DC

## 2021-07-01 MED ORDER — FLUTICASONE PROPIONATE 50 MCG/ACT NA SUSP: 1.0000 | Freq: Two times a day (BID) | NASAL | 2 refills | Status: DC

## 2021-07-01 NOTE — Discharge Instructions (Signed)
Use your fluticasone nasal spray to help manage your congestion.  Wash your hands frequently.  You can use warm compresses to both eyes twice a day or more to help deal with drainage; use a clean washcloth each time.

## 2021-07-01 NOTE — ED Triage Notes (Signed)
Reports eye drainage on rt ,cough and congestion for 3 days.

## 2021-07-01 NOTE — ED Provider Notes (Signed)
[facility]    CSN: [id number] Arrival date & time: 07/01/21  1649      History   Chief Complaint Chief Complaint  Patient presents with   Cough   Eye Drainage   Nasal Congestion    HPI Chelsea Branch is a 56 y.o. female.  Patient reports she recently had influenza.  She was getting better and then about 4 5 days ago developed purulent green nasal drainage and 3 days ago developed purulent drainage and redness in both eyes.   Cough Associated symptoms: eye discharge   Associated symptoms: no chills, no fever, no shortness of breath, no sore throat and no wheezing    Past Medical History:  Diagnosis Date   Abnormal thyroid blood test 03/15/2017   Abnormal uterine bleeding    Alopecia    Anemia    Angina pectoris with normal coronary arteriogram ([place]) 2017   Had + Troponin c/w ? 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 iniatially ?2/2 peripartum in 1994 - improved by 2008 then worsening EF in 2011 back down to EF 25-30%. b. echo 01/21/14 showed mod LVH, [name]; 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   NSTEMI (non-ST elevated myocardial infarction) ([place]) - with normal cornaries on Cath 75/02/2584   Systolic and diastolic CHF, acute on chronic ([place]) 06/14/2015   H/O noncompliance with medical treatment, presenting hazards to health    Low back pain 09/16/2014   Homelessness 09/16/2014   Vitamin D deficiency 01/29/2014   OSA (obstructive sleep apnea) 01/08/2014   Chronic combined systolic and diastolic CHF (congestive heart failure) ([place]) 08/14/2013   Essential hypertension    Fibroids 08/09/2012   Menorrhagia 05/08/2012   Female pattern hair loss    HLD (hyperlipidemia) 06/08/2010   CEREBRAL ANEURYSM 06/08/2010   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;  Service: Cardiovascular;  Laterality: N/A;   [place] IMPLANT  ~ [place]     TUBAL LIGATION  1994    OB History     Gravida  2   Para  2   Term  2   Preterm  AB      Living  3      SAB      IAB      Ectopic      Multiple  1   Live Births  3            Home Medications    Prior to Admission medications   Medication Sig Start Date End Date Taking? Authorizing Provider  acetaminophen (TYLENOL) 500 MG tablet Take 1 tablet (500 mg total) by mouth every 6 (six) hours as needed. 05/19/21   Hazel Sams, PA-C  albuterol (VENTOLIN HFA) 108 (90 Base) MCG/ACT inhaler Inhale 1-2 puffs into the lungs every 6 (six) hours as needed for wheezing or shortness of breath. 05/31/21   Jaynee Eagles, PA-C  aspirin 81 MG EC tablet Take 1 tablet (81 mg total) by mouth daily. 03/31/16   Milagros Loll, MD  cetirizine (ZYRTEC ALLERGY) 10 MG tablet Take 1 tablet (10 mg total) by mouth daily. 05/31/21   Jaynee Eagles, PA-C  diclofenac Sodium (VOLTAREN) 1 % GEL Apply 4 g topically 4 (four) times daily. 01/05/21   Pearson Forster, NP  dicyclomine (BENTYL) 20 MG tablet Take 1 tablet (20 mg total) by mouth 2 (two) times daily. 02/23/21   Marney Setting, NP  diphenhydrAMINE (BENADRYL) 25 MG tablet Take 1 tablet (25 mg total) by mouth every 6 (six) hours. 09/13/19   Loura Halt A, NP  doxycycline (VIBRAMYCIN) 100 MG capsule Take 1 capsule (100 mg total) by mouth 2 (two) times daily. 07/01/21  Yes Carvel Getting, NP  fluticasone (FLONASE) 50 MCG/ACT nasal spray Place 1 spray into both nostrils in the morning and at bedtime. 07/01/21  Yes Carvel Getting, NP  guaiFENesin (MUCINEX) 600 MG 12 hr tablet Take 1 tablet (600 mg total) by mouth 2 (two) times daily as needed. 10/14/20   Volney American, PA-C  guaiFENesin-codeine [facility]) 100-10 MG/5ML syrup Take 5 mLs by mouth 3 (three) times daily as needed for cough. 05/31/21   Roemhildt, Lorin T, PA-C  ibuprofen (ADVIL) 800 MG tablet Take 1 tablet (800 mg total) by mouth 3 (three) times daily. 06/24/20   Hazel Sams, PA-C  ibuprofen (ADVIL) 800 MG tablet Take 1 tablet (800 mg total) by mouth 3 (three) times daily. 05/31/21   Roemhildt, Lorin T, PA-C  magnesium oxide (MAG-OX) 400 MG tablet TAKE 1 CAPSULE (400 MG TOTAL) BY MOUTH 2 (TWO) TIMES DAILY. 01/22/20   Deboraha Sprang, MD  Menthol (CEPACOL SORE THROAT) 5.4 MG LOZG Use as directed 1 lozenge (5.4 mg total) in the mouth or throat every 2 (two) hours as needed. 02/11/20   Darr, Edison Nasuti, PA-C  metoprolol succinate (TOPROL-XL) 50 MG 24 hr tablet Take 1 tablet (50 mg total) by mouth daily. Please make yearly appt with Dr. Caryl Comes for December 2022 for future refills. Thank you 1st attempt 02/23/21   Deboraha Sprang, MD  metroNIDAZOLE (FLAGYL) 500 MG tablet Take 1 tablet (500 mg total) by mouth 2 (two) times daily.  02/25/21   LampteyMyrene Galas, MD  Misc. Devices MISC Please provide patient with insurance approved straight cane 09/29/18   Gildardo Pounds, NP  Misc. Devices MISC Single-point cane with adjustable height x1, shower chair x1, transfer bench x1.  Diagnosis congestive heart failure, degenerative disc disease of the lumbar spine. 11/23/18   Charlott Rakes, MD  mupirocin ointment (BACTROBAN) 2 % Apply 1 application topically 2 (two) times daily.  11/03/19   Wieters, Hallie C, PA-C  ondansetron (ZOFRAN ODT) 4 MG disintegrating tablet Take 1 tablet (4 mg total) by mouth every 8 (eight) hours as needed for nausea or vomiting. 09/11/20   Lamptey, Myrene Galas, MD  ondansetron (ZOFRAN) 4 MG tablet Take 1 tablet (4 mg total) by mouth every 6 (six) hours. 02/23/21   Marney Setting, NP  oseltamivir (TAMIFLU) 75 MG capsule Take 1 capsule (75 mg total) by mouth 2 (two) times daily. 05/31/21   Jaynee Eagles, PA-C  potassium chloride SA (KLOR-CON M20) 20 MEQ tablet TAKE 1-2 TABS BY MOUTH AS DIRECTED. TAKE 2 TABLETS IN THE MORNING AND 1 TABLET IN THE EVENING 06/29/21   Deboraha Sprang, MD  promethazine-dextromethorphan (PROMETHAZINE-DM) 6.25-15 MG/5ML syrup Take 5 mLs by mouth 4 (four) times daily as needed for cough. 05/31/21   Jaynee Eagles, PA-C  pseudoephedrine (SUDAFED) 30 MG tablet Take 1 tablet (30 mg total) by mouth every 8 (eight) hours as needed for congestion. 05/31/21   Jaynee Eagles, PA-C  sacubitril-valsartan (ENTRESTO) 24-26 MG Take 1 tablet by mouth 2 (two) times daily. >>SEE DIRECTIONS AS YOUR DOSE HAS CHANGED<< 12/09/20   Deboraha Sprang, MD  simvastatin (ZOCOR) 10 MG tablet TAKE 1 TABLET BY MOUTH EVERY DAY IN THE EVENING. Please make overdue appt with Dr. Caryl Comes before anymore refills. Thank you 1st attempt 07/01/21   Deboraha Sprang, MD  spironolactone (ALDACTONE) 25 MG tablet TAKE 1/2 TABLET BY MOUTH EVERY DAY 06/25/21   Deboraha Sprang, MD  tiZANidine (ZANAFLEX) 4 MG tablet Take 1 tablet (4 mg total) by mouth every 8  (eight) hours as needed for muscle spasms. DO NOT DRINK ALCOHOL OR DRIVE WHILE TAKING THIS MEDICATION 01/05/21   Pearson Forster, NP  torsemide (DEMADEX) 20 MG tablet TAKE 1 TABLET BY MOUTH EVERY DAY 06/22/21   Deboraha Sprang, MD  triamcinolone cream (KENALOG) 0.1 % Apply 1 application topically 2 (two) times daily. 01/05/21   Pearson Forster, NP  trimethoprim-polymyxin b (POLYTRIM) ophthalmic solution Place 1 drop into both eyes every 4 (four) hours. 07/01/21  Yes Carvel Getting, NP  DULoxetine (CYMBALTA) 30 MG capsule Take 1 capsule (30 mg total) by mouth daily. 12/07/18 12/10/19  Charlott Rakes, MD  famotidine (PEPCID) 20 MG tablet Take 1 tablet (20 mg total) 2 (two) times daily by mouth. 04/10/17 11/03/19  Albesa Seen, PA-C    Family History Family History  Problem Relation Age of Onset   Cancer Maternal Grandmother        uterine   Hypertension Sister    Healthy Mother    Other Neg Hx    Heart disease Neg Hx     Social History Social History   Tobacco Use   Smoking status: Former    Packs/day: 0.10    Years: 30.00    Pack years: 3.00    Types: Cigarettes    Quit date: 06/03/2015    Years since quitting: 6.0   Smokeless tobacco: Never   Tobacco comments:    Pt. stated she stopped smoking a year ago. 09/29/2018  Vaping Use   Vaping Use: Never used  Substance Use Topics   Alcohol use: No    Alcohol/week: 0.0 standard drinks   Drug use: No     Allergies   Ace inhibitors   Review of Systems Review of Systems  Constitutional:  Negative for chills and fever.  HENT:  Positive for congestion. Negative for sinus pressure and sore  throat.   Eyes:  Positive for discharge and redness.  Respiratory:  Positive for cough. Negative for shortness of breath and wheezing.     Physical Exam Triage Vital Signs ED Triage Vitals  Enc Vitals Group     BP 07/01/21 1757 127/82     Pulse Rate 07/01/21 1757 88     Resp 07/01/21 1757 20     Temp 07/01/21 1757 98.6 F (37 C)      Temp src --      SpO2 07/01/21 1757 95 %     Weight --      Height --      Head Circumference --      Peak Flow --      Pain Score 07/01/21 1755 0     Pain Loc --      Pain Edu? --      Excl. in [place]? --    No data found.  Updated Vital Signs BP 127/82    Pulse 88    Temp 98.6 F (37 C)    Resp 20    SpO2 95%   Visual Acuity Right Eye Distance: 20/25 Left Eye Distance: 20/25 Bilateral Distance: 20/25  Right Eye Near:   Left Eye Near:    Bilateral Near:     Physical Exam Constitutional:      Appearance: Normal appearance. She is ill-appearing.  HENT:     Right Ear: External ear normal. There is impacted cerumen.     Left Ear: External ear normal. There is impacted cerumen.     Nose: Congestion present.     Mouth/Throat:     Mouth: Mucous membranes are moist.     Pharynx: Oropharynx is clear.  Eyes:     General:        Right eye: Discharge present.        Left eye: Discharge present.    Conjunctiva/sclera:     Right eye: Right conjunctiva is injected.     Left eye: Left conjunctiva is injected.  Cardiovascular:     Rate and Rhythm: Normal rate and regular rhythm.  Pulmonary:     Effort: Pulmonary effort is normal.     Breath sounds: Normal breath sounds.  Neurological:     Mental Status: She is alert.     UC Treatments / Results  Labs (all labs ordered are listed, but only abnormal results are displayed) Labs Reviewed - No data to display  EKG   Radiology No results found.  Procedures Procedures (including critical care time)  Medications Ordered in UC Medications - No data to display  Initial Impression / Assessment and Plan / UC Course  I have reviewed the triage vital signs and the nursing notes.  Pertinent labs & imaging results that were available during my care of the patient were reviewed by me and considered in my medical decision making (see chart for details).    Patient has developed a secondary bacterial sinus infection after  influenza that is starting to spread to her eyes.  We will treat for sinusitis and conjunctivitis.  Patient asked for refill of her Flonase nasal spray and I sent in a prescription.  Final Clinical Impressions(s) / UC Diagnoses   Final diagnoses:  Acute bacterial sinusitis  Bacterial conjunctivitis of both eyes     Discharge Instructions      Use your fluticasone nasal spray to help manage your congestion.  Wash your hands frequently.  You can use warm compresses to both  eyes twice a day or more to help deal with drainage; use a clean washcloth each time.   ED Prescriptions     Medication Sig Dispense Auth. Provider   trimethoprim-polymyxin b (POLYTRIM) ophthalmic solution Place 1 drop into both eyes every 4 (four) hours. 10 mL Carvel Getting, NP   doxycycline (VIBRAMYCIN) 100 MG capsule Take 1 capsule (100 mg total) by mouth 2 (two) times daily. 20 capsule Carvel Getting, NP   fluticasone (FLONASE) 50 MCG/ACT nasal spray Place 1 spray into both nostrils in the morning and at bedtime. 16 g Carvel Getting, NP      PDMP not reviewed this encounter.   Carvel Getting, NP 07/01/21 1824

## 2021-07-08 ENCOUNTER — Telehealth: Payer: Self-pay | Admitting: Family Medicine

## 2021-07-08 NOTE — Telephone Encounter (Signed)
.. °  Medicaid Managed Care   Unsuccessful Outreach Note  07/08/2021 Name: Chelsea Branch MRN: [id number] DOB: April 01, 1966  Referred by: Charlott Rakes, MD Reason for referral : High Risk Managed Medicaid (I tried to call this patient today to get her scheduled with the MM Team. Her number would not work. I called it twice.)   An unsuccessful telephone outreach was attempted today. The patient was referred to the case management team for assistance with care management and care coordination.   Follow Up Plan: The care management team will reach out to the patient again over the next 14 days.    [place]

## 2021-07-19 ENCOUNTER — Other Ambulatory Visit: Payer: Self-pay | Admitting: Internal Medicine

## 2021-07-20 ENCOUNTER — Ambulatory Visit: Payer: Self-pay | Admitting: *Deleted

## 2021-07-20 ENCOUNTER — Telehealth: Payer: Self-pay | Admitting: Internal Medicine

## 2021-07-20 MED ORDER — METOPROLOL SUCCINATE ER 50 MG PO TB24: 50.0000 mg | ORAL_TABLET | Freq: Every day | ORAL | 0 refills | Status: DC

## 2021-07-20 NOTE — Telephone Encounter (Signed)
Pt's medication was sent to pt's pharmacy as requested. Confirmation received.  °

## 2021-07-20 NOTE — Telephone Encounter (Signed)
° ° °  Chief Complaint: SOB Symptoms: SOB with exertion, cannot lie flat at night, mild ankle swelling Frequency: 1 month ago Pertinent Negatives: Patient denies CP, SOB at rest Disposition: [] ED /[] Urgent Care (no appt availability in office) / [x] Appointment(In office/virtual)/ []  [facility]/ [] Home Care/ [] Refused Recommended Disposition /[] [facility]/ []  Follow-up with PCP Additional Notes: Appt secured for tomorrow. Advised ED for worsening symptoms.   Reason for Disposition  [1] MILD difficulty breathing (e.g., minimal/no SOB at rest, SOB with walking, pulse <100) AND [2] NEW-onset or WORSE than normal    X 1 month. Intermittent  Answer Assessment - Initial Assessment Questions 1. RESPIRATORY STATUS: "Describe your breathing?" (e.g., wheezing, shortness of breath, unable to speak, severe coughing)      Can not lie flat at night 2. ONSET: "When did this breathing problem begin?"      1 month ago 3. PATTERN "Does the difficult breathing come and go, or has it been constant since it started?"      Comes and goes 4. SEVERITY: "How bad is your breathing?" (e.g., mild, moderate, severe)    - MILD: No SOB at rest, mild SOB with walking, speaks normally in sentences, can lie down, no retractions, pulse < 100.    - MODERATE: SOB at rest, SOB with minimal exertion and prefers to sit, cannot lie down flat, speaks in phrases, mild retractions, audible wheezing, pulse 100-120.    - SEVERE: Very SOB at rest, speaks in single words, struggling to breathe, sitting hunched forward, retractions, pulse > 120      moderate 5. RECURRENT SYMPTOM: "Have you had difficulty breathing before?" If Yes, ask: "When was the last time?" and "What happened that time?"      Yes Years ago 6. CARDIAC HISTORY: "Do you have any history of heart disease?" (e.g., heart attack, angina, bypass surgery, angioplasty)       7. LUNG HISTORY: "Do you have any history of lung disease?"  (e.g., pulmonary  embolus, asthma, emphysema)      8. CAUSE: "What do you think is causing the breathing problem?"       9. OTHER SYMPTOMS: "Do you have any other symptoms? (e.g., dizziness, runny nose, cough, chest pain, fever)     Ankles edematous 10. O2 SATURATION MONITOR:  "Do you use an oxygen saturation monitor (pulse oximeter) at home?" If Yes, "What is your reading (oxygen level) today?" "What is your usual oxygen saturation reading?" (e.g., 95%)       NA  Protocols used: Breathing Difficulty-A-AH

## 2021-07-20 NOTE — Telephone Encounter (Signed)
°*  STAT* If patient is at the pharmacy, call can be transferred to refill team.   1. Which medications need to be refilled? (please list name of each medication and dose if known) metoprolol succinate (TOPROL-XL) 50 MG 24 hr tablet  2. Which pharmacy/location (including street and city if local pharmacy) is medication to be sent to? [id number] [place] [place] - [address] AT [place]  3. Do they need a 30 day or 90 day supply? 90   Patient has appt on 3/24

## 2021-07-21 ENCOUNTER — Telehealth: Payer: Self-pay | Admitting: Critical Care Medicine

## 2021-07-21 ENCOUNTER — Other Ambulatory Visit: Payer: Self-pay

## 2021-07-21 ENCOUNTER — Ambulatory Visit: Payer: Medicaid Other | Attending: Critical Care Medicine | Admitting: Critical Care Medicine

## 2021-07-21 ENCOUNTER — Encounter: Payer: Self-pay | Admitting: Critical Care Medicine

## 2021-07-21 ENCOUNTER — Telehealth: Payer: Self-pay

## 2021-07-21 VITALS — BP 104/77 | HR 81 | Wt 206.8 lb

## 2021-07-21 DIAGNOSIS — E782 Mixed hyperlipidemia: Secondary | ICD-10-CM | POA: Diagnosis not present

## 2021-07-21 DIAGNOSIS — Q052 Lumbar spina bifida with hydrocephalus: Secondary | ICD-10-CM

## 2021-07-21 DIAGNOSIS — T8339XA Other mechanical complication of intrauterine contraceptive device, initial encounter: Secondary | ICD-10-CM

## 2021-07-21 DIAGNOSIS — J449 Chronic obstructive pulmonary disease, unspecified: Secondary | ICD-10-CM | POA: Insufficient documentation

## 2021-07-21 DIAGNOSIS — L658 Other specified nonscarring hair loss: Secondary | ICD-10-CM

## 2021-07-21 DIAGNOSIS — I5022 Chronic systolic (congestive) heart failure: Secondary | ICD-10-CM

## 2021-07-21 DIAGNOSIS — Z124 Encounter for screening for malignant neoplasm of cervix: Secondary | ICD-10-CM

## 2021-07-21 DIAGNOSIS — I252 Old myocardial infarction: Secondary | ICD-10-CM | POA: Diagnosis not present

## 2021-07-21 DIAGNOSIS — J441 Chronic obstructive pulmonary disease with (acute) exacerbation: Secondary | ICD-10-CM | POA: Diagnosis not present

## 2021-07-21 DIAGNOSIS — L659 Nonscarring hair loss, unspecified: Secondary | ICD-10-CM | POA: Diagnosis not present

## 2021-07-21 DIAGNOSIS — Z1231 Encounter for screening mammogram for malignant neoplasm of breast: Secondary | ICD-10-CM

## 2021-07-21 DIAGNOSIS — I420 Dilated cardiomyopathy: Secondary | ICD-10-CM

## 2021-07-21 DIAGNOSIS — M5416 Radiculopathy, lumbar region: Secondary | ICD-10-CM

## 2021-07-21 DIAGNOSIS — Z1211 Encounter for screening for malignant neoplasm of colon: Secondary | ICD-10-CM

## 2021-07-21 DIAGNOSIS — I10 Essential (primary) hypertension: Secondary | ICD-10-CM | POA: Diagnosis not present

## 2021-07-21 DIAGNOSIS — Z1159 Encounter for screening for other viral diseases: Secondary | ICD-10-CM

## 2021-07-21 DIAGNOSIS — R7989 Other specified abnormal findings of blood chemistry: Secondary | ICD-10-CM

## 2021-07-21 DIAGNOSIS — E876 Hypokalemia: Secondary | ICD-10-CM

## 2021-07-21 DIAGNOSIS — M48061 Spinal stenosis, lumbar region without neurogenic claudication: Secondary | ICD-10-CM | POA: Insufficient documentation

## 2021-07-21 DIAGNOSIS — Z5900 Homelessness unspecified: Secondary | ICD-10-CM

## 2021-07-21 MED ORDER — LIDOCAINE 5 % EX PTCH: 1.0000 | MEDICATED_PATCH | CUTANEOUS | 0 refills | Status: DC

## 2021-07-21 MED ORDER — ALBUTEROL SULFATE HFA 108 (90 BASE) MCG/ACT IN AERS: 1.0000 | INHALATION_SPRAY | Freq: Four times a day (QID) | RESPIRATORY_TRACT | 2 refills | Status: DC | PRN

## 2021-07-21 MED ORDER — SPIRONOLACTONE 25 MG PO TABS: 12.5000 mg | ORAL_TABLET | Freq: Every day | ORAL | 2 refills | Status: DC

## 2021-07-21 MED ORDER — FLUTICASONE PROPIONATE 50 MCG/ACT NA SUSP: 1.0000 | Freq: Two times a day (BID) | NASAL | 2 refills | Status: DC

## 2021-07-21 MED ORDER — METOPROLOL SUCCINATE ER 50 MG PO TB24: 50.0000 mg | ORAL_TABLET | Freq: Every day | ORAL | 2 refills | Status: DC

## 2021-07-21 MED ORDER — ONDANSETRON HCL 4 MG PO TABS: 4.0000 mg | ORAL_TABLET | Freq: Four times a day (QID) | ORAL | 0 refills | Status: DC

## 2021-07-21 MED ORDER — IBUPROFEN 800 MG PO TABS: 800.0000 mg | ORAL_TABLET | Freq: Three times a day (TID) | ORAL | 0 refills | Status: DC | PRN

## 2021-07-21 MED ORDER — POTASSIUM CHLORIDE CRYS ER 20 MEQ PO TBCR: EXTENDED_RELEASE_TABLET | ORAL | 2 refills | Status: DC

## 2021-07-21 MED ORDER — SIMVASTATIN 10 MG PO TABS: ORAL_TABLET | ORAL | 4 refills | Status: DC

## 2021-07-21 MED ORDER — TORSEMIDE 20 MG PO TABS: 20.0000 mg | ORAL_TABLET | Freq: Every day | ORAL | 2 refills | Status: DC

## 2021-07-21 MED ORDER — TRIAMCINOLONE ACETONIDE 0.1 % EX CREA: 1.0000 "application " | TOPICAL_CREAM | Freq: Two times a day (BID) | CUTANEOUS | 2 refills | Status: DC

## 2021-07-21 MED ORDER — DICLOFENAC SODIUM 1 % EX GEL: 4.0000 g | Freq: Four times a day (QID) | CUTANEOUS | 0 refills | Status: DC

## 2021-07-21 MED ORDER — POLYMYXIN B-TRIMETHOPRIM 10000-0.1 UNIT/ML-% OP SOLN: 1.0000 [drp] | OPHTHALMIC | 0 refills | Status: DC

## 2021-07-21 MED ORDER — TIZANIDINE HCL 4 MG PO TABS: 4.0000 mg | ORAL_TABLET | Freq: Three times a day (TID) | ORAL | 0 refills | Status: DC | PRN

## 2021-07-21 MED ORDER — MAGNESIUM OXIDE 400 MG PO TABS: ORAL_TABLET | ORAL | 2 refills | Status: DC

## 2021-07-21 MED ORDER — SACUBITRIL-VALSARTAN 24-26 MG PO TABS: 1.0000 | ORAL_TABLET | Freq: Two times a day (BID) | ORAL | 0 refills | Status: DC

## 2021-07-21 NOTE — Patient Instructions (Signed)
[facility]   A cane was given  You met Opal Sidles our case manager  [place] our social worker will call you for an appt  Colon cancer screening issued  A mammogram will be scheduled  Gynecology and dermatology will be consulted  Return Dr Joya Gaskins 2 months  Keep cardiology appt on march 24  We will work with you on housing

## 2021-07-21 NOTE — Assessment & Plan Note (Signed)
-  Continue statins ?

## 2021-07-21 NOTE — Assessment & Plan Note (Signed)
Chronic low back pain affecting patient's ambulation  We will refill lidocaine patch muscle relaxant Voltaren gel

## 2021-07-21 NOTE — Assessment & Plan Note (Signed)
Follow-up metabolic panel 

## 2021-07-21 NOTE — Assessment & Plan Note (Signed)
COPD currently stable

## 2021-07-21 NOTE — Assessment & Plan Note (Signed)
Monitor

## 2021-07-21 NOTE — Assessment & Plan Note (Signed)
Needs cardiology follow up 

## 2021-07-21 NOTE — Assessment & Plan Note (Signed)
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

## 2021-07-21 NOTE — Assessment & Plan Note (Signed)
Nonischemic cardiomyopathy with combined systolic diastolic heart failure we will renew all medications at this time and patient encouraged to keep her upcoming cardiology appointment

## 2021-07-21 NOTE — Assessment & Plan Note (Signed)
Connect with nurse case Freight forward[employer] and social work

## 2021-07-21 NOTE — Telephone Encounter (Signed)
Please connect with this poor patient living in her car she is homeless nervous anxious upset has multiple medical problems Chelsea Branch also knows about her

## 2021-07-21 NOTE — Assessment & Plan Note (Signed)
IUD in place this needs to be removed needs a Pap smear

## 2021-07-21 NOTE — Telephone Encounter (Signed)
Met with the patient when she was in the clinic today.  She explained that she does not have consistent work so she does not have an income to rely on to support herself.  She currently stays at the [facility] has been there on and off since July 2022.  She pays $55/day or about $250/week. She was not sure of the exact amount.  She explained that if she does not have money for a room, she stays in her car.  Her mother lives in the area but she chooses to stay alone, stating she needs time for herself.  She further explained that prior to living in a motel or her car, she had an apartment but the landlord did not address the rodent infestation and she left without paying at least 2 months rent. She would like to know how much she owes in rent and eventually would like to apply for senior housing through the [employer]. She was in agreement to sending a referral to [employer] [facility] [place] to possibly assist with determining her outstanding bills and status with the [employer]. The referral was then faxed to [place].   The patient was also interested in any possible financial assistance she could receive for her current stay in the motel. Explained to her that there is no guarantee that funding for her motel stay would be paid, but this CM can check with Patient Assistance Funding.   I provided the patient with a list of local food pantries and information about the [employer] mobile food markets. The patient has a car and is able to drive to the markets. She was also in agreement to placing a referral to [employer] for nutrition/grocery assistance.  That referral was sent to Manuela Schwartz [facility]/[employer].

## 2021-07-21 NOTE — Progress Notes (Signed)
Established Patient Office Visit  Subjective:  [name], female    DOB: 09/26/1965  Age: 56 y.o. MRN: [id number]  CC:  Chief Complaint  Patient presents with   Shortness of Breath   Leg Pain    Left leg and ankle    Alopecia    HPI Chelsea Branch presents for primary care to establish.  Patient used to see Dr. Margarita Rana but has not been seen in this office since 2 early 2021.  The patient currently lives in her car and is homeless.  She has chronic history of dyspnea on exertion alopecia the left leg swelling.  She has an IUD in place has been there for 3 years.  She has history of uterine fibroids and is having discomfort in the pelvic area.  She does need a Pap smear as well.  Patient is suffering from anxiety and depression over being homeless.  She has an upcoming appointment with cardiology in March and she is encouraged to keep this.  She has history of diastolic heart failure and is on antihypertensive therapy for same. Patient will need an updated echocardiogram.  She uses the emergency room frequently including several visits for influenza this past winter.  She needs an updated mammogram.  Patient needs fecal occult kit for colon cancer screening as well. She did not get a flu vaccine this past year but did have influenza twice so does not need the flu vaccine now.   Past Medical History:  Diagnosis Date   Abnormal thyroid blood test 03/15/2017   Abnormal uterine bleeding    Alopecia    Anemia    Angina pectoris with normal coronary arteriogram ([place]) 2017   Had + Troponin c/w ? 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  iniatially ?2/2 peripartum in 1994 - improved by 2008 then worsening EF in 2011 back down to EF 25-30%. b. echo 01/21/14 showed mod LVH, [name]; 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   Peripartum cardiomyopathy 1994   Sleep apnea 2015   CPAP 12/2013   Stroke ([place]) 3710   Systolic and diastolic CHF, acute on chronic ([place]) 06/14/2015   Tobacco abuse     Past Surgical History:  Procedure Laterality Date   CARDIAC CATHETERIZATION N/A 06/16/2015   Procedure: Left Heart Cath and Coronary Angiography;  Surgeon: Jettie Booze, MD;  Location: [facility];  Service: Cardiovascular;  Laterality: N/A;   Two Strike IMPLANT  ~ 2000   TIBIAL TUBERCLERPLASTY     TUBAL LIGATION  1994    Family History  Problem Relation Age of Onset   Cancer Maternal Grandmother        uterine   Hypertension Sister    Healthy Mother    Other Neg Hx    Heart disease Neg Hx     Social History   Socioeconomic History   Marital status: Single    Spouse  name: Not on file   Number of children: 3   Years of education: 3   Highest education level: Not on file  Occupational History   Occupation: unemployed  Tobacco Use   Smoking status: Former    Packs/day: 0.10    Years: 30.00    Pack years: 3.00    Types: Cigarettes    Quit date: 06/03/2015    Years since quitting: 6.1   Smokeless tobacco: Never   Tobacco comments:    Pt. stated she stopped smoking a year ago. 09/29/2018  Vaping Use   Vaping Use: Never used  Substance and Sexual Activity   Alcohol use: No    Alcohol/week: 0.0 standard drinks   Drug use: No   Sexual activity: Yes    Birth control/protection: I.U.D.    Comment: fibroids  Other Topics Concern   Not on file  Social History Narrative   Lives at home with 57 yo twins (28 and [place])   37  yo son lives outside the home   4 yo granddaughter    Social Determinants of Radio broadcast assistant Strain: Not on file  Food Insecurity: Not on file  Transportation Needs: Not on file  Physical Activity: Not on file  Stress: Not on file  Social Connections: Not on file  Intimate Partner Violence: Not on file    Outpatient Medications Prior to Visit  Medication Sig Dispense Refill   acetaminophen (TYLENOL) 500 MG tablet Take 1 tablet (500 mg total) by mouth every 6 (six) hours as needed. 30 tablet 0   Misc. Devices MISC Please provide patient with insurance approved straight cane 1 each 0   Misc. Devices MISC Single-point cane with adjustable height x1, shower chair x1, transfer bench x1.  Diagnosis congestive heart failure, degenerative disc disease of the lumbar spine. 1 each 0   albuterol (VENTOLIN HFA) 108 (90 Base) MCG/ACT inhaler Inhale 1-2 puffs into the lungs every 6 (six) hours as needed for wheezing or shortness of breath. 18 g 2   cetirizine (ZYRTEC ALLERGY) 10 MG tablet Take 1 tablet (10 mg total) by mouth daily. 30 tablet 0   diclofenac Sodium (VOLTAREN) 1 % GEL Apply 4 g topically 4 (four) times daily. 100 g 0   dicyclomine (BENTYL) 20 MG tablet Take 1 tablet (20 mg total) by mouth 2 (two) times daily. 20 tablet 0   diphenhydrAMINE (BENADRYL) 25 MG tablet Take 1 tablet (25 mg total) by mouth every 6 (six) hours. 20 tablet 0   doxycycline (VIBRAMYCIN) 100 MG capsule Take 1 capsule (100 mg total) by mouth 2 (two) times daily. 20 capsule 0   fluticasone (FLONASE) 50 MCG/ACT nasal spray Place 1 spray into both nostrils in the morning and at bedtime. 16 g 2   guaiFENesin (MUCINEX) 600 MG 12 hr tablet Take 1 tablet (600 mg total) by mouth 2 (two) times daily as needed. 30 tablet 0   guaiFENesin-codeine (ROBITUSSIN AC) 100-10 MG/5ML syrup Take 5 mLs by mouth 3 (three) times daily as needed for cough. 120 mL 0   ibuprofen (ADVIL) 800 MG tablet Take 1 tablet (800 mg total) by  mouth 3 (three) times daily. 21 tablet 1   ibuprofen (ADVIL) 800 MG tablet Take 1 tablet (800 mg total) by mouth 3 (three) times daily. 21 tablet 0   magnesium oxide (MAG-OX) 400 MG tablet TAKE 1 CAPSULE (400 MG TOTAL) BY MOUTH 2 (TWO) TIMES DAILY. 180 tablet 2   Menthol (CEPACOL SORE THROAT)  5.4 MG LOZG Use as directed 1 lozenge (5.4 mg total) in the mouth or throat every 2 (two) hours as needed. 30 lozenge 0   metoprolol succinate (TOPROL-XL) 50 MG 24 hr tablet Take 1 tablet (50 mg total) by mouth daily. Please keep upcoming appt with Cardiologist in March 2023 before anymore refills. Thank you Final attempt 30 tablet 0   metroNIDAZOLE (FLAGYL) 500 MG tablet Take 1 tablet (500 mg total) by mouth 2 (two) times daily. 14 tablet 0   mupirocin ointment (BACTROBAN) 2 % Apply 1 application topically 2 (two) times daily. 30 g 0   ondansetron (ZOFRAN ODT) 4 MG disintegrating tablet Take 1 tablet (4 mg total) by mouth every 8 (eight) hours as needed for nausea or vomiting. 20 tablet 0   ondansetron (ZOFRAN) 4 MG tablet Take 1 tablet (4 mg total) by mouth every 6 (six) hours. 12 tablet 0   oseltamivir (TAMIFLU) 75 MG capsule Take 1 capsule (75 mg total) by mouth 2 (two) times daily. 10 capsule 0   potassium chloride SA (KLOR-CON M20) 20 MEQ tablet TAKE 1-2 TABS BY MOUTH AS DIRECTED. TAKE 2 TABLETS IN THE MORNING AND 1 TABLET IN THE EVENING 90 tablet 0   promethazine-dextromethorphan (PROMETHAZINE-DM) 6.25-15 MG/5ML syrup Take 5 mLs by mouth 4 (four) times daily as needed for cough. 100 mL 0   pseudoephedrine (SUDAFED) 30 MG tablet Take 1 tablet (30 mg total) by mouth every 8 (eight) hours as needed for congestion. 30 tablet 0   sacubitril-valsartan (ENTRESTO) 24-26 MG Take 1 tablet by mouth 2 (two) times daily. >>SEE DIRECTIONS AS YOUR DOSE HAS CHANGED<< 180 tablet 0   simvastatin (ZOCOR) 10 MG tablet TAKE 1 TABLET BY MOUTH EVERY DAY IN THE EVENING. Please make overdue appt with Dr. Caryl Comes before anymore  refills. Thank you 1st attempt 30 tablet 0   spironolactone (ALDACTONE) 25 MG tablet TAKE 1/2 TABLET BY MOUTH EVERY DAY 15 tablet 0   tiZANidine (ZANAFLEX) 4 MG tablet Take 1 tablet (4 mg total) by mouth every 8 (eight) hours as needed for muscle spasms. DO NOT DRINK ALCOHOL OR DRIVE WHILE TAKING THIS MEDICATION 12 tablet 0   torsemide (DEMADEX) 20 MG tablet TAKE 1 TABLET BY MOUTH EVERY DAY 30 tablet 0   triamcinolone cream (KENALOG) 0.1 % Apply 1 application topically 2 (two) times daily. 30 g 0   trimethoprim-polymyxin b (POLYTRIM) ophthalmic solution Place 1 drop into both eyes every 4 (four) hours. 10 mL 0   aspirin 81 MG EC tablet Take 1 tablet (81 mg total) by mouth daily. (Patient not taking: Reported on 07/21/2021) 30 tablet 0   No facility-administered medications prior to visit.    Allergies  Allergen Reactions   Ace Inhibitors Other (See Comments)    REACTION: Cough    ROS Review of Systems  Constitutional:  Negative for chills, diaphoresis and fever.  HENT: Negative.  Negative for congestion, hearing loss, nosebleeds, sore throat and tinnitus.   Eyes:  Negative for photophobia and redness.  Respiratory:  Positive for shortness of breath. Negative for cough, wheezing and stridor.        Orthopnea cough  Cardiovascular:  Positive for leg swelling. Negative for chest pain and palpitations.  Gastrointestinal: Negative.  Negative for abdominal pain, blood in stool, constipation, diarrhea, nausea and vomiting.  Endocrine: Negative for polydipsia.  Genitourinary:  Positive for pelvic pain and vaginal bleeding. Negative for dysuria, flank pain, frequency, hematuria and urgency.  Musculoskeletal:  Positive for back  pain and gait problem. Negative for myalgias and neck pain.       Frequent falls leg pain chronic low back pain  Skin: Negative.  Negative for rash.       Hair loss  Allergic/Immunologic: Negative for environmental allergies.  Neurological:  Negative for dizziness,  tremors, seizures, weakness and headaches.  Hematological:  Does not bruise/bleed easily.  Psychiatric/Behavioral:  Positive for dysphoric mood. Negative for sleep disturbance and suicidal ideas. The patient is nervous/anxious.      Objective:    Physical Exam Vitals reviewed.  Constitutional:      Appearance: Normal appearance. She is well-developed. She is obese. She is not diaphoretic.  HENT:     Head: Normocephalic and atraumatic.     Nose: No nasal deformity, septal deviation, mucosal edema or rhinorrhea.     Right Sinus: No maxillary sinus tenderness or frontal sinus tenderness.     Left Sinus: No maxillary sinus tenderness or frontal sinus tenderness.     Mouth/Throat:     Mouth: Mucous membranes are moist.     Pharynx: Oropharynx is clear. No oropharyngeal exudate.  Eyes:     General: No scleral icterus.    Conjunctiva/sclera: Conjunctivae normal.     Pupils: Pupils are equal, round, and reactive to light.  Neck:     Thyroid: No thyromegaly.     Vascular: No carotid bruit or JVD.     Trachea: Trachea normal. No tracheal tenderness or tracheal deviation.  Cardiovascular:     Rate and Rhythm: Normal rate and regular rhythm. No extrasystoles are present.    Chest Wall: PMI is not displaced.     Pulses: Normal pulses. No decreased pulses.     Heart sounds: Normal heart sounds, S1 normal and S2 normal. Heart sounds not distant. No murmur heard. No systolic murmur is present.  No diastolic murmur is present.    No friction rub. No gallop. No S3 or S4 sounds.  Pulmonary:     Effort: Pulmonary effort is normal. No tachypnea, accessory muscle usage or respiratory distress.     Breath sounds: Normal breath sounds. No stridor. No decreased breath sounds, wheezing, rhonchi or rales.  Chest:     Chest wall: No mass, deformity, tenderness, crepitus or edema. There is no dullness to percussion.  Abdominal:     General: Bowel sounds are normal. There is no distension.      Palpations: Abdomen is soft. Abdomen is not rigid.     Tenderness: There is no abdominal tenderness. There is no guarding or rebound.  Musculoskeletal:        General: Normal range of motion.     Cervical back: Normal range of motion and neck supple. No edema, erythema or rigidity. No muscular tenderness. Normal range of motion.     Right lower leg: No tenderness. Edema present.     Left lower leg: No tenderness. Edema present.  Lymphadenopathy:     Head:     Right side of head: No submental or submandibular adenopathy.     Left side of head: No submental or submandibular adenopathy.     Cervical: No cervical adenopathy.  Skin:    General: Skin is warm and dry.     Coloration: Skin is not pale.     Findings: No rash.     Nails: There is no clubbing.     Comments: Alopecia diffuse  Neurological:     General: No focal deficit present.     Mental Status:  She is alert and oriented to person, place, and time.     Cranial Nerves: No cranial nerve deficit.     Sensory: No sensory deficit.     Motor: No weakness.  Psychiatric:        Mood and Affect: Mood is anxious.        Speech: Speech normal.        Behavior: Behavior normal.    BP 104/77 (BP Location: Right Arm, Patient Position: Sitting, Cuff Size: Large)    Pulse 81    Wt 206 lb 12.8 oz (93.8 kg)    SpO2 97%    BMI 31.44 kg/m  Wt Readings from Last 3 Encounters:  07/21/21 206 lb 12.8 oz (93.8 kg)  05/31/21 211 lb (95.7 kg)  05/02/20 227 lb (103 kg)     Health Maintenance Due  Topic Date Due   COVID-19 Vaccine (1) Never done   Zoster Vaccines- Shingrix (1 of 2) Never done   COLON CANCER SCREENING ANNUAL FOBT  09/07/2010   MAMMOGRAM  Never done   PAP SMEAR-Modifier  05/09/2015    There are no preventive care reminders to display for this patient.  Lab Results  Component Value Date   TSH 0.517 06/26/2018   Lab Results  Component Value Date   WBC 7.6 10/11/2019   HGB 15.2 10/11/2019   HCT 44.3 10/11/2019   MCV 87  10/11/2019   PLT 238 10/11/2019   Lab Results  Component Value Date   NA 137 10/11/2019   K 3.7 10/11/2019   CO2 24 10/11/2019   GLUCOSE 270 (H) 10/11/2019   BUN 12 10/11/2019   CREATININE 1.03 (H) 10/11/2019   BILITOT 0.7 06/10/2019   ALKPHOS 92 06/10/2019   AST 23 06/10/2019   ALT 26 06/10/2019   PROT 7.2 06/10/2019   ALBUMIN 3.0 (L) 06/10/2019   CALCIUM 9.1 10/11/2019   ANIONGAP 14 06/10/2019   GFR 86.67 03/12/2014   Lab Results  Component Value Date   CHOL 135 06/15/2015   Lab Results  Component Value Date   HDL 35 (L) 06/15/2015   Lab Results  Component Value Date   LDLCALC 89 06/15/2015   Lab Results  Component Value Date   TRIG 53 06/15/2015   Lab Results  Component Value Date   CHOLHDL 3.9 06/15/2015   Lab Results  Component Value Date   HGBA1C 5.8 (H) 06/14/2015      Assessment & Plan:   Problem List Items Addressed This Visit       Cardiovascular and Mediastinum   Essential hypertension (Chronic)    Blood pressure under good control continue current medications check metabolic panel      Relevant Medications   metoprolol succinate (TOPROL-XL) 50 MG 24 hr tablet   sacubitril-valsartan (ENTRESTO) 24-26 MG   simvastatin (ZOCOR) 10 MG tablet   spironolactone (ALDACTONE) 25 MG tablet   torsemide (DEMADEX) 20 MG tablet   Other Relevant Orders   CBC with Differential/Platelet   Nonischemic dilated cardiomyopathy ([place])    Nonischemic cardiomyopathy with combined systolic diastolic heart failure we will renew all medications at this time and patient encouraged to keep her upcoming cardiology appointment      Relevant Medications   metoprolol succinate (TOPROL-XL) 50 MG 24 hr tablet   sacubitril-valsartan (ENTRESTO) 24-26 MG   simvastatin (ZOCOR) 10 MG tablet   spironolactone (ALDACTONE) 25 MG tablet   torsemide (DEMADEX) 20 MG tablet     Respiratory   COPD exacerbation ([place])  COPD currently stable      Relevant Medications    albuterol (VENTOLIN HFA) 108 (90 Base) MCG/ACT inhaler   fluticasone (FLONASE) 50 MCG/ACT nasal spray     Nervous and Auditory   Lumbar radiculitis    Chronic low back pain affecting patient's ambulation  We will refill lidocaine patch muscle relaxant Voltaren gel      Relevant Medications   tiZANidine (ZANAFLEX) 4 MG tablet     Musculoskeletal and Integument   Female pattern hair loss    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 will refer to dermatology        Other   HLD (hyperlipidemia) (Chronic)    Continue statins      Relevant Medications   metoprolol succinate (TOPROL-XL) 50 MG 24 hr tablet   sacubitril-valsartan (ENTRESTO) 24-26 MG   simvastatin (ZOCOR) 10 MG tablet   spironolactone (ALDACTONE) 25 MG tablet   torsemide (DEMADEX) 20 MG tablet   Other Relevant Orders   Lipid panel   Homelessness (Chronic)    Connect with nurse case manager and social work      History of non-ST elevation myocardial infarction (NSTEMI)    Needs cardiology follow-up      Hypokalemia    Follow-up metabolic panel      Abnormal thyroid blood test    Monitor      Mechanical complication due to intrauterine contraceptive device    IUD in place this needs to be removed needs a Pap smear      Relevant Orders   Ambulatory referral to Gynecology   Other Visit Diagnoses     Need for hepatitis C screening test    -  Primary   Relevant Orders   HCV Ab w Reflex to Quant PCR   Colon cancer screening       Relevant Orders   Fecal occult blood, imunochemical   Encounter for screening mammogram for malignant neoplasm of breast       Relevant Orders   MM DIGITAL SCREENING BILATERAL   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)   (Chronic)     Chronic systolic congestive heart failure (H[facility])       Relevant Medications   metoprolol succinate (TOPROL-XL) 50 MG 24 hr tablet   sacubitril-valsartan (ENTRESTO) 24-26 MG   simvastatin  (ZOCOR) 10 MG tablet   spironolactone (ALDACTONE) 25 MG tablet   torsemide (DEMADEX) 20 MG tablet   Other Relevant Orders   Comprehensive metabolic panel   Hypomagnesemia       Relevant Orders   Magnesium   Alopecia       Relevant Orders   Ambulatory referral to Gynecology   Ambulatory referral to Dermatology   Cervical cancer screening       Relevant Orders   Ambulatory referral to Gynecology       Meds ordered this encounter  Medications   albuterol (VENTOLIN HFA) 108 (90 Base) MCG/ACT inhaler    Sig: Inhale 1-2 puffs into the lungs every 6 (six) hours as needed for wheezing or shortness of breath.    Dispense:  18 g    Refill:  2   diclofenac Sodium (VOLTAREN) 1 % GEL    Sig: Apply 4 g topically 4 (four) times daily.    Dispense:  100 g    Refill:  0   fluticasone (FLONASE) 50 MCG/ACT nasal spray    Sig: Place 1 spray into both nostrils in the morning and at  bedtime.    Dispense:  16 g    Refill:  2   ibuprofen (ADVIL) 800 MG tablet    Sig: Take 1 tablet (800 mg total) by mouth every 8 (eight) hours as needed for moderate pain.    Dispense:  30 tablet    Refill:  0   magnesium oxide (MAG-OX) 400 MG tablet    Sig: TAKE 1 CAPSULE (400 MG TOTAL) BY MOUTH 2 (TWO) TIMES DAILY.    Dispense:  180 tablet    Refill:  2   metoprolol succinate (TOPROL-XL) 50 MG 24 hr tablet    Sig: Take 1 tablet (50 mg total) by mouth daily. Please keep upcoming appt with Cardiologist in March 2023 before anymore refills. Thank you Final attempt    Dispense:  90 tablet    Refill:  2   ondansetron (ZOFRAN) 4 MG tablet    Sig: Take 1 tablet (4 mg total) by mouth every 6 (six) hours.    Dispense:  12 tablet    Refill:  0   potassium chloride SA (KLOR-CON M20) 20 MEQ tablet    Sig: TAKE 1-2 TABS BY MOUTH AS DIRECTED. TAKE 2 TABLETS IN THE MORNING AND 1 TABLET IN THE EVENING    Dispense:  90 tablet    Refill:  2   sacubitril-valsartan (ENTRESTO) 24-26 MG    Sig: Take 1 tablet by mouth 2 (two)  times daily. >>SEE DIRECTIONS AS YOUR DOSE HAS CHANGED<<    Dispense:  180 tablet    Refill:  0   simvastatin (ZOCOR) 10 MG tablet    Sig: TAKE 1 TABLET BY MOUTH EVERY DAY IN THE EVENING. Please make overdue appt with Dr. Caryl Comes before anymore refills. Thank you 1st attempt    Dispense:  60 tablet    Refill:  4   spironolactone (ALDACTONE) 25 MG tablet    Sig: Take 0.5 tablets (12.5 mg total) by mouth daily.    Dispense:  60 tablet    Refill:  2   tiZANidine (ZANAFLEX) 4 MG tablet    Sig: Take 1 tablet (4 mg total) by mouth every 8 (eight) hours as needed for muscle spasms. DO NOT DRINK ALCOHOL OR DRIVE WHILE TAKING THIS MEDICATION    Dispense:  30 tablet    Refill:  0   torsemide (DEMADEX) 20 MG tablet    Sig: Take 1 tablet (20 mg total) by mouth daily.    Dispense:  60 tablet    Refill:  2    Please call our to schedule an appointment for any future refills Thank you. [phone].   triamcinolone cream (KENALOG) 0.1 %    Sig: Apply 1 application topically 2 (two) times daily.    Dispense:  30 g    Refill:  2   trimethoprim-polymyxin b (POLYTRIM) ophthalmic solution    Sig: Place 1 drop into both eyes every 4 (four) hours.    Dispense:  10 mL    Refill:  0   lidocaine (LIDODERM) 5 %    Sig: Place 1 patch onto the skin daily. Remove & Discard patch within 12 hours or as directed by MD    Dispense:  30 patch    Refill:  0   42 minutes spent reviewing prior chart documentation performing history and physical educating patient high degree of complexity multiple systems assessed Follow-up: Return in about 2 months (around 09/18/2021).    Asencion Noble, MD

## 2021-07-21 NOTE — Assessment & Plan Note (Signed)
Blood pressure under good control continue current medications check metabolic panel

## 2021-07-22 ENCOUNTER — Telehealth: Payer: Self-pay | Admitting: Critical Care Medicine

## 2021-07-22 LAB — COMPREHENSIVE METABOLIC PANEL
ALT: 16 IU/L (ref 0–32)
AST: 18 IU/L (ref 0–40)
Albumin/Globulin Ratio: 1.4 (ref 1.2–2.2)
Albumin: 4.2 g/dL (ref 3.8–4.9)
Alkaline Phosphatase: 105 IU/L (ref 44–121)
BUN/Creatinine Ratio: 18 (ref 9–23)
BUN: 16 mg/dL (ref 6–24)
Bilirubin Total: 0.3 mg/dL (ref 0.0–1.2)
CO2: 26 mmol/L (ref 20–29)
Calcium: 9.9 mg/dL (ref 8.7–10.2)
Chloride: 98 mmol/L (ref 96–106)
Creatinine, Ser: 0.89 mg/dL (ref 0.57–1.00)
Globulin, Total: 3 g/dL (ref 1.5–4.5)
Glucose: 103 mg/dL — ABNORMAL HIGH (ref 70–99)
Potassium: 5 mmol/L (ref 3.5–5.2)
Sodium: 138 mmol/L (ref 134–144)
Total Protein: 7.2 g/dL (ref 6.0–8.5)
eGFR: 76 mL/min/{1.73_m2} (ref >59)

## 2021-07-22 LAB — CBC WITH DIFFERENTIAL/PLATELET
Basophils Absolute: 0 10*3/uL (ref 0.0–0.2)
Basos: 1 %
EOS (ABSOLUTE): 0.1 10*3/uL (ref 0.0–0.4)
Eos: 2 %
Hematocrit: 48.1 % — ABNORMAL HIGH (ref 34.0–46.6)
Hemoglobin: 15.7 g/dL (ref 11.1–15.9)
Immature Grans (Abs): 0 10*3/uL (ref 0.0–0.1)
Immature Granulocytes: 0 %
Lymphocytes Absolute: 2.8 10*3/uL (ref 0.7–3.1)
Lymphs: 50 %
MCH: 27.9 pg (ref 26.6–33.0)
MCHC: 32.6 g/dL (ref 31.5–35.7)
MCV: 86 fL (ref 79–97)
Monocytes Absolute: 0.4 10*3/uL (ref 0.1–0.9)
Monocytes: 7 %
Neutrophils Absolute: 2.2 10*3/uL (ref 1.4–7.0)
Neutrophils: 40 %
Platelets: 281 10*3/uL (ref 150–450)
RBC: 5.62 x10E6/uL — ABNORMAL HIGH (ref 3.77–5.28)
RDW: 12.1 % (ref 11.7–15.4)
WBC: 5.6 10*3/uL (ref 3.4–10.8)

## 2021-07-22 LAB — LIPID PANEL
Chol/HDL Ratio: 3.6 ratio (ref 0.0–4.4)
Cholesterol, Total: 150 mg/dL (ref 100–199)
HDL: 42 mg/dL (ref >39)
LDL Chol Calc (NIH): 85 mg/dL (ref 0–99)
Triglycerides: 131 mg/dL (ref 0–149)
VLDL Cholesterol Cal: 23 mg/dL (ref 5–40)

## 2021-07-22 LAB — MAGNESIUM: Magnesium: 2.2 mg/dL (ref 1.6–2.3)

## 2021-07-22 LAB — HCV INTERPRETATION

## 2021-07-22 LAB — HCV AB W REFLEX TO QUANT PCR: HCV Ab: NONREACTIVE

## 2021-07-22 MED ORDER — BLOOD PRESSURE KIT DEVI
0 refills | Status: DC
Start: 2021-07-22 — End: 2023-04-12

## 2021-07-22 NOTE — Telephone Encounter (Signed)
Call placed to patient and informed her that Dr Joya Gaskins sent an order for BP monitor to [facility].  ?

## 2021-07-22 NOTE — Telephone Encounter (Signed)
Tell pt I have sent Rx for Bp monitor to [facility] should cover cost ?

## 2021-07-22 NOTE — Telephone Encounter (Signed)
-----   Message from Eden Lathe, RN sent at 07/21/2021  5:43 PM EST ----- ? ?She would like a new home BP monitor ? ?

## 2021-07-23 ENCOUNTER — Telehealth: Payer: Self-pay

## 2021-07-23 NOTE — Telephone Encounter (Signed)
-----   Message from Elsie Stain, MD sent at 07/22/2021  5:56 AM EST ----- ?Let pt know I sent rx for bp monitor to [facility] for medicaid coverage,  kidney and liver is normal, blood count normal  hep C neg, cholesterol is normal, magnesium normal:  stay on magnesium supplement, follow healthy diet , no need for cholesterol medication ?

## 2021-07-23 NOTE — Telephone Encounter (Signed)
Pt was called and vm was left, Information has been sent to nurse pool.   

## 2021-07-27 NOTE — Telephone Encounter (Signed)
I attempted to call pt, no answer, left vm.

## 2021-07-27 NOTE — Telephone Encounter (Signed)
Pt called in wanting lab results. Let her know that she spoke with another NT on 07/24/21 but gave the results again from Dr. Joya Gaskins. Pt verbalized understanding and had no further questions.  ?

## 2021-07-30 ENCOUNTER — Telehealth: Payer: Self-pay | Admitting: Critical Care Medicine

## 2021-07-30 ENCOUNTER — Encounter (HOSPITAL_COMMUNITY): Payer: Self-pay

## 2021-07-30 ENCOUNTER — Ambulatory Visit (HOSPITAL_COMMUNITY)
Admission: EM | Admit: 2021-07-30 | Discharge: 2021-07-30 | Disposition: A | Discharge status: Home / Self Care | Payer: Medicaid Other | Attending: Family Medicine | Admitting: Family Medicine

## 2021-07-30 ENCOUNTER — Other Ambulatory Visit: Payer: Self-pay

## 2021-07-30 DIAGNOSIS — M25512 Pain in left shoulder: Secondary | ICD-10-CM | POA: Diagnosis not present

## 2021-07-30 DIAGNOSIS — M25511 Pain in right shoulder: Secondary | ICD-10-CM | POA: Diagnosis not present

## 2021-07-30 MED ORDER — TRIAMCINOLONE ACETONIDE 40 MG/ML IJ SUSP
INTRAMUSCULAR | Status: AC
  Filled 2021-07-30: qty 1

## 2021-07-30 MED ORDER — IBUPROFEN 800 MG PO TABS: 800.0000 mg | ORAL_TABLET | Freq: Three times a day (TID) | ORAL | 0 refills | Status: DC | PRN

## 2021-07-30 MED ORDER — TRIAMCINOLONE ACETONIDE 40 MG/ML IJ SUSP
40.0000 mg | Freq: Once | INTRAMUSCULAR | Status: AC
  Administered 2021-07-30: 11:00:00 40 mg via INTRAMUSCULAR

## 2021-07-30 NOTE — Discharge Instructions (Addendum)
You have been given a shot of triamcinolone 40 mg today ? ?Take ibuprofen 800 mg--1 tablet every 8 hours as needed for pain ? ? ?

## 2021-07-30 NOTE — Telephone Encounter (Signed)
Pt is calling to speak to [place].  ?CB- [phone] ?

## 2021-07-30 NOTE — ED Provider Notes (Addendum)
[facility]    CSN: [id number] Arrival date & time: 07/30/21  7654      History   Chief Complaint Chief Complaint  Patient presents with   Shoulder Pain    HPI Chelsea Branch is a 56 y.o. female.    Shoulder Pain Here for bilateral shoulder pain.  Is been bothering her in the last week, and bothers her mainly when she is lying down.  No trauma, fall, or other injury.  No rash, and no fever.  She would like to try a steroid shot, and she has not picked up her ibuprofen that was sent in for her by her primary care in February.  Renal function done at the end of February this year was normal  Past Medical History:  Diagnosis Date   Abnormal thyroid blood test 03/15/2017   Abnormal uterine bleeding    Alopecia    Anemia    Angina pectoris with normal coronary arteriogram ([place]) 2017   Had + Troponin c/w ? 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 iniatially ?2/2 peripartum in 1994 - improved by 2008 then worsening EF in 2011 back down to EF 25-30%. b. echo 01/21/14 showed mod LVH, [name]; 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   Peripartum cardiomyopathy 1994   Sleep apnea 2015   CPAP 12/2013   Stroke ([place]) 6503   Systolic and diastolic CHF, acute on chronic ([place]) 06/14/2015   Tobacco abuse     Patient Active Problem List   Diagnosis Date Noted   Spinal stenosis of lumbar region 07/21/2021   Lumbar radiculitis 07/21/2021   COPD exacerbation ([place]) 07/21/2021   Mechanical complication  due to intrauterine contraceptive device 07/21/2021   Arthropathy of lumbar facet joint 03/14/2019   Degenerative disc disease at L5-S1 level 10/10/2018   Abnormal thyroid blood test 03/15/2017   Hypokalemia 07/23/2016   Low TSH level 04/26/2016   Abnormal uterine bleeding 04/06/2016   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   History of non-ST elevation myocardial infarction (NSTEMI) 06/14/2015   Low back pain 09/16/2014   Homelessness 09/16/2014   Vitamin D deficiency 01/29/2014   OSA (obstructive sleep apnea) 01/08/2014   Chronic combined systolic and diastolic CHF (congestive heart failure) ([place]) 08/14/2013   Essential hypertension    Fibroids 08/09/2012   Menorrhagia 05/08/2012   Female pattern hair loss    HLD (hyperlipidemia) 06/08/2010   CEREBRAL ANEURYSM 06/08/2010   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   CEREBROVASCULAR ACCIDENT, HX OF 06/08/2010    Past Surgical History:  Procedure Laterality Date   CARDIAC CATHETERIZATION N/A 06/16/2015   Procedure: Left Heart Cath and Coronary Angiography;  Surgeon: Jettie Booze, MD;  Location: [facility];  Service: Cardiovascular;  Laterality: N/A;   CESAREAN SECTION  1992  1994   LOOP RECORDER IMPLANT  ~ [place]    OB History     Gravida  2   Para  2   Term  2   Preterm      AB      Living  3      SAB      IAB      Ectopic      Multiple  1   Live Births  3            Home Medications    Prior to Admission medications   Medication Sig Start Date End Date Taking? Authorizing Provider  acetaminophen (TYLENOL) 500 MG tablet Take 1 tablet (500 mg total) by mouth every 6 (six) hours as needed. 05/19/21   Hazel Sams, PA-C  albuterol (VENTOLIN HFA) 108 (90 Base) MCG/ACT inhaler Inhale 1-2 puffs into the lungs every 6 (six) hours as needed for wheezing or shortness of breath. 07/21/21   Elsie Stain, MD  Blood Pressure  Monitoring (BLOOD PRESSURE KIT) DEVI Use to measure blood pressure twice daily 07/22/21   Elsie Stain, MD  diclofenac Sodium (VOLTAREN) 1 % GEL Apply 4 g topically 4 (four) times daily. 07/21/21   Elsie Stain, MD  fluticasone (FLONASE) 50 MCG/ACT nasal spray Place 1 spray into both nostrils in the morning and at bedtime. 07/21/21   Elsie Stain, MD  ibuprofen (ADVIL) 800 MG tablet Take 1 tablet (800 mg total) by mouth every 8 (eight) hours as needed for moderate pain. 07/30/21   Barrett Henle, MD  lidocaine (LIDODERM) 5 % Place 1 patch onto the skin daily. Remove & Discard patch within 12 hours or as directed by MD 07/21/21   Elsie Stain, MD  magnesium oxide (MAG-OX) 400 MG tablet TAKE 1 CAPSULE (400 MG TOTAL) BY MOUTH 2 (TWO) TIMES DAILY. 07/21/21   Elsie Stain, MD  metoprolol succinate (TOPROL-XL) 50 MG 24 hr tablet Take 1 tablet (50 mg total) by mouth daily. Please keep upcoming appt with Cardiologist in March 2023 before anymore refills. Thank you Final attempt 07/21/21   Elsie Stain, MD  Misc. Devices MISC Please provide patient with insurance approved straight cane 09/29/18   Gildardo Pounds, NP  Misc. Devices MISC Single-point cane with adjustable height x1, shower chair x1, transfer bench x1.  Diagnosis congestive heart failure, degenerative disc disease of the lumbar spine. 11/23/18   Charlott Rakes, MD  ondansetron (ZOFRAN) 4 MG tablet Take 1 tablet (4 mg total) by mouth every 6 (six) hours. 07/21/21   Elsie Stain, MD  potassium chloride SA (KLOR-CON M20) 20 MEQ tablet TAKE 1-2 TABS BY MOUTH AS DIRECTED. TAKE 2 TABLETS IN THE MORNING AND 1 TABLET IN THE EVENING 07/21/21   Elsie Stain, MD  sacubitril-valsartan (ENTRESTO) 24-26 MG Take 1 tablet by mouth 2 (two) times daily. >>SEE DIRECTIONS AS YOUR DOSE HAS CHANGED<< 07/21/21   Elsie Stain, MD  simvastatin (ZOCOR) 10 MG tablet TAKE 1 TABLET BY MOUTH EVERY DAY IN THE EVENING. Please make overdue appt  with Dr. Caryl Comes before anymore refills. Thank you 1st attempt 07/21/21   Elsie Stain, MD  spironolactone (ALDACTONE) 25 MG tablet Take 0.5 tablets (12.5 mg total) by mouth daily. 07/21/21   Elsie Stain, MD  tiZANidine (ZANAFLEX) 4 MG tablet Take 1 tablet (4 mg total) by mouth every 8 (eight) hours as needed for muscle spasms. DO NOT DRINK ALCOHOL OR DRIVE WHILE TAKING THIS MEDICATION 07/21/21   Elsie Stain, MD  torsemide (DEMADEX) 20 MG tablet Take 1  tablet (20 mg total) by mouth daily. 07/21/21   Elsie Stain, MD  triamcinolone cream (KENALOG) 0.1 % Apply 1 application topically 2 (two) times daily. 07/21/21   Elsie Stain, MD  trimethoprim-polymyxin b (POLYTRIM) ophthalmic solution Place 1 drop into both eyes every 4 (four) hours. 07/21/21   Elsie Stain, MD  DULoxetine (CYMBALTA) 30 MG capsule Take 1 capsule (30 mg total) by mouth daily. 12/07/18 12/10/19  Charlott Rakes, MD  famotidine (PEPCID) 20 MG tablet Take 1 tablet (20 mg total) 2 (two) times daily by mouth. 04/10/17 11/03/19  Albesa Seen, PA-C    Family History Family History  Problem Relation Age of Onset   Cancer Maternal Grandmother        uterine   Hypertension Sister    Healthy Mother    Other Neg Hx    Heart disease Neg Hx     Social History Social History   Tobacco Use   Smoking status: Former    Packs/day: 0.10    Years: 30.00    Pack years: 3.00    Types: Cigarettes    Quit date: 06/03/2015    Years since quitting: 6.1   Smokeless tobacco: Never   Tobacco comments:    Pt. stated she stopped smoking a year ago. 09/29/2018  Vaping Use   Vaping Use: Never used  Substance Use Topics   Alcohol use: No    Alcohol/week: 0.0 standard drinks   Drug use: No     Allergies   Ace inhibitors   Review of Systems Review of Systems   Physical Exam Triage Vital Signs ED Triage Vitals  Enc Vitals Group     BP 07/30/21 1029 132/77     Pulse Rate 07/30/21 1029 72     Resp 07/30/21  1029 20     Temp 07/30/21 1029 97.9 F (36.6 C)     Temp Source 07/30/21 1029 Oral     SpO2 07/30/21 1029 98 %     Weight --      Height --      Head Circumference --      Peak Flow --      Pain Score 07/30/21 1027 8     Pain Loc --      Pain Edu? --      Excl. in [place]? --    No data found.  Updated Vital Signs BP 132/77 (BP Location: Right Arm)    Pulse 72    Temp 97.9 F (36.6 C) (Oral)    Resp 20    SpO2 98%   Visual Acuity Right Eye Distance:   Left Eye Distance:   Bilateral Distance:    Right Eye Near:   Left Eye Near:    Bilateral Near:     Physical Exam Vitals reviewed.  Constitutional:      General: She is not in acute distress.    Appearance: She is not ill-appearing, toxic-appearing or diaphoretic.  HENT:     Mouth/Throat:     Mouth: Mucous membranes are moist.  Cardiovascular:     Rate and Rhythm: Normal rate and regular rhythm.     Heart sounds: No murmur heard. Pulmonary:     Breath sounds: Normal breath sounds.  Musculoskeletal:        General: No swelling, tenderness or deformity.     Cervical back: Neck supple.     Comments: Range of motion normal about both shoulders, but it does cause a little discomfort  Lymphadenopathy:     Cervical: No cervical adenopathy.     UC Treatments / Results  Labs (all labs ordered are listed, but only abnormal results are displayed) Labs Reviewed - No data to display  EKG   Radiology No results found.  Procedures Procedures (including critical care time)  Medications Ordered in UC Medications  triamcinolone acetonide (KENALOG-40) injection 40 mg (has no administration in time range)    Initial Impression / Assessment and Plan / UC Course  I have reviewed the triage vital signs and the nursing notes.  Pertinent labs & imaging results that were available during my care of the patient were reviewed by me and considered in my medical decision making (see chart for details).     We will do a shot of  triamcinolone here, and send in a new prescription of the ibuprofen to be used as needed Final Clinical Impressions(s) / UC Diagnoses   Final diagnoses:  Acute pain of both shoulders     Discharge Instructions      You have been given a shot of triamcinolone 40 mg today  Take ibuprofen 800 mg--1 tablet every 8 hours as needed for pain       ED Prescriptions     Medication Sig Dispense Auth. Provider   ibuprofen (ADVIL) 800 MG tablet Take 1 tablet (800 mg total) by mouth every 8 (eight) hours as needed for moderate pain. 30 tablet [place], Gwenlyn Perking, MD      I have reviewed the PDMP during this encounter.   Barrett Henle, MD 07/30/21 1101    Barrett Henle, MD 07/30/21 1102    Barrett Henle, MD 07/30/21 1104

## 2021-07-30 NOTE — ED Triage Notes (Signed)
Pt c/o shoulder pain for about a week.  ?

## 2021-07-31 NOTE — Telephone Encounter (Signed)
I spoke with this pt and she stated that she had not heard from legal aid and has tried to call them back. I sent an email to legal aid letting them know that she has not heard anything. I will also send additional housing resources to pt. I scheduled appt for 08/31/21.

## 2021-08-04 ENCOUNTER — Telehealth: Payer: Self-pay | Admitting: Family Medicine

## 2021-08-04 NOTE — Telephone Encounter (Signed)
Called patient to inform of appointment scheduled from referral, there was no answer to the phone call so a voicemail was left and a letter was mailed.  ?

## 2021-08-04 NOTE — Telephone Encounter (Signed)
Call returned to patient. She said that she has not been able to reach [facility].  She said they called and left a message for her but she has not been able to speak with anyone.   ?Informed her that this CM will contact [facility] and request that they contact her again.  ?

## 2021-08-12 ENCOUNTER — Telehealth: Payer: Self-pay | Admitting: *Deleted

## 2021-08-12 NOTE — Telephone Encounter (Signed)
Copied from [place] [phone]. Topic: General - Other ?>> Aug 11, 2021  3:55 PM Leward Quan A wrote: ?Reason for CRM: Patient called in asking social worker Ms Leilani Merl to please call her soon as she can. Can be reached at Ph# [phone] ?

## 2021-08-13 NOTE — Progress Notes (Signed)
? ?Cardiology Office Note ?Date:  08/13/2021  ?Patient ID:  Chelsea Branch, DOB November 23, 1965, MRN [id number] ?PCP:  Elsie Stain, MD  ?Cardiologist:  Dr. Caryl Comes ? ?  ?Chief Complaint:  annual visit ? ?History of Present Illness: ?Chelsea Branch is a 56 y.o. 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 ? ? ?She comes in today to be seen for Dr.Klein, last seen by him Dec 2021, was having some dyspnea, no edema or PND, weight up 30-40lbs over a few years ?Orthostatic lightheadedness ?Echo with normalization of her EF 2020 ?SOB felt 2/2 weight gain, encouraged exercise and weight loss ?Metoprolol and entresto reduced with lightheadedness ?Recommended echo in 2022 ? ?ER/UCC visits ?01/05/21, leg swelling, back pain, b/l rash on elbows, d/c from the ER recommended to elevate legs and continue diuretic, f/u with cardiology ?02/23/21: flank pain ?05/19/22 ear drainage ?05/31/21 SOB, cough, body aches + Flu ?05/31/21: returned with same symptoms, continue supportive care ?06/20/21 vaginal discharge ?07/01/21 cough, eye drainage, nasal congestion ?07/30/21 shoulder pain ? ? ?TODAY ?For the most part doing OK< works on a [employer] and can be long hours, generally fatigued ?Occassionally or a few nights a week for the last 4 mo or so she has felt some night time palpitations/flutters, and some SOB in bed, better with an extra pillow. ?No CP, no daytime or exertional SOB ?No near syncope or syncope. ? ? ?Past Medical History:  ?Diagnosis Date  ? Abnormal thyroid blood test 03/15/2017  ? Abnormal uterine bleeding   ? Alopecia   ? Anemia   ? Angina pectoris with normal coronary arteriogram [facility]) 2017  ? Had + Troponin c/w ? NSTEMI due to A on C CHF  ? 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 Chronic combined systolic and diastolic CHF (congestive heart failure) ([place])   ? DYSLIPIDEMIA   ? Essential hypertension   ? Fibroids   ? H/O noncompliance  with medical treatment, presenting hazards to health   ? Hypertension   ? Hypokalemia 07/23/2016  ? Menorrhagia   ? 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 a. iniatially ?2/2 peripartum in 1994 - improved by 2008 then worsening EF in 2011 back down to EF 25-30%. b. echo 01/21/14 showed mod LVH, [name]; [name], [name], [name],   ? 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 NSTEMI (non-ST elevated myocardial infarction) ([place]) 05/2015  ? Normal Coronaries.  ? Peripartum cardiomyopathy 1994  ? Sleep apnea 2015  ? CPAP 12/2013  ? Stroke [facility]) 2011  ? Systolic and diastolic CHF, acute on chronic ([place]) 06/14/2015  ? Tobacco abuse   ? ? ?Past Surgical History:  ?Procedure Laterality Date  ? CARDIAC CATHETERIZATION N/A 06/16/2015  ? Procedure: Left Heart Cath and Coronary Angiography;  Surgeon: Jettie Booze, MD;  Location: [facility];  Service: Cardiovascular;  Laterality: N/A;  ? [place]  ? LOOP RECORDER IMPLANT  ~ 2000  ? TIBIAL TUBERCLERPLASTY    ? TUBAL LIGATION  1994  ? ? ?Current Outpatient Medications  ?Medication Sig Dispense Refill  ? acetaminophen (TYLENOL) 500 MG tablet Take 1 tablet (500 mg total) by mouth every 6 (six) hours as needed. 30 tablet 0  ? albuterol (VENTOLIN HFA) 108 (90 Base) MCG/ACT inhaler Inhale 1-2 puffs into the lungs every 6 (six) hours as needed for wheezing or shortness of  breath. 18 g 2  ? Blood Pressure Monitoring (BLOOD PRESSURE KIT) DEVI Use to measure blood pressure twice daily 1 each 0  ? diclofenac Sodium (VOLTAREN) 1 % GEL Apply 4 g topically 4 (four) times daily. 100 g 0  ? fluticasone (FLONASE) 50 MCG/ACT nasal spray Place 1 spray into both nostrils in the morning and at bedtime. 16 g 2  ? ibuprofen (ADVIL) 800 MG tablet Take 1 tablet (800 mg total) by mouth every 8 (eight) hours as needed for moderate pain. 30 tablet 0  ? lidocaine (LIDODERM) 5 % Place 1 patch onto the skin daily. Remove & Discard patch  within 12 hours or as directed by MD 30 patch 0  ? magnesium oxide (MAG-OX) 400 MG tablet TAKE 1 CAPSULE (400 MG TOTAL) BY MOUTH 2 (TWO) TIMES DAILY. 180 tablet 2  ? metoprolol succinate (TOPROL-XL) 50 MG 24 hr tablet Take 1 tablet (50 mg total) by mouth daily. Please keep upcoming appt with Cardiologist in March 2023 before anymore refills. Thank you Final attempt 90 tablet 2  ? Misc. Devices MISC Please provide patient with insurance approved straight cane 1 each 0  ? Misc. Devices MISC Single-point cane with adjustable height x1, shower chair x1, transfer bench x1.  Diagnosis congestive heart failure, degenerative disc disease of the lumbar spine. 1 each 0  ? ondansetron (ZOFRAN) 4 MG tablet Take 1 tablet (4 mg total) by mouth every 6 (six) hours. 12 tablet 0  ? potassium chloride SA (KLOR-CON M20) 20 MEQ tablet TAKE 1-2 TABS BY MOUTH AS DIRECTED. TAKE 2 TABLETS IN THE MORNING AND 1 TABLET IN THE EVENING 90 tablet 2  ? sacubitril-valsartan (ENTRESTO) 24-26 MG Take 1 tablet by mouth 2 (two) times daily. >>SEE DIRECTIONS AS YOUR DOSE HAS CHANGED<< 180 tablet 0  ? simvastatin (ZOCOR) 10 MG tablet TAKE 1 TABLET BY MOUTH EVERY DAY IN THE EVENING. Please make overdue appt with Dr. Caryl Comes before anymore refills. Thank you 1st attempt 60 tablet 4  ? spironolactone (ALDACTONE) 25 MG tablet Take 0.5 tablets (12.5 mg total) by mouth daily. 60 tablet 2  ? tiZANidine (ZANAFLEX) 4 MG tablet Take 1 tablet (4 mg total) by mouth every 8 (eight) hours as needed for muscle spasms. DO NOT DRINK ALCOHOL OR DRIVE WHILE TAKING THIS MEDICATION 30 tablet 0  ? torsemide (DEMADEX) 20 MG tablet Take 1 tablet (20 mg total) by mouth daily. 60 tablet 2  ? triamcinolone cream (KENALOG) 0.1 % Apply 1 application topically 2 (two) times daily. 30 g 2  ? trimethoprim-polymyxin b (POLYTRIM) ophthalmic solution Place 1 drop into both eyes every 4 (four) hours. 10 mL 0  ? ?No current facility-administered medications for this visit.  ? ? ?Allergies:    Ace inhibitors  ? ?Social History:  The patient  reports that she quit smoking about 6 years ago. Her smoking use included cigarettes. She has a 3.00 pack-year smoking history. She has never used smokeless tobacco. She reports that she does not drink alcohol and does not use drugs.  ? ?Family History:  The patient's family history includes Cancer in her maternal grandmother; Healthy in her mother; Hypertension in her sister. ? ?ROS:  Please see the history of present illness.  ?All other systems are reviewed and otherwise negative.  ? ?PHYSICAL EXAM:  ?VS:  There were no vitals taken for this visit. BMI: There is no height or weight on file to calculate BMI. ?Well nourished, well developed, in no acute distress  ?  HEENT: normocephalic, atraumatic  ?Neck: no JVD, carotid bruits or masses ?Cardiac:  RRR; no significant murmurs, no rubs, or gallops ?Lungs:  CTA b/l, no wheezing, rhonchi or rales  ?Abd: soft, non-tender, obese ?MS: no deformity or atrophy ?Ext: no edema is appreciated ?Skin: warm and dry, no rash ?Neuro:  No gross deficits appreciated ?Psych: euthymic mood, full affect ? ? ?EKG:  done 05/31/21 personally reviewed ?SR 87bpm, PVC ? ?07/07/2018: TTE ? 1. The left ventricle has normal systolic function with an ejection  ?fraction of 60-65%. The cavity size was normal. There is moderately  ?increased left ventricular wall thickness. Left ventricular diastolic  ?Doppler parameters are consistent with  ?pseudonormalization No evidence of left ventricular regional wall motion  ?abnormalities.  ? 2. The right ventricle has normal systolic function. The cavity was  ?normal. There is no increase in right ventricular wall thickness.  ? 3. The mitral valve is normal in structure. No evidence of mitral valve  ?stenosis. No regurgitation.  ? 4. The tricuspid valve is normal in structure.  ? 5. The aortic valve is tricuspid no stenosis of the aortic valve.  ? 6. The pulmonic valve was normal in structure.  ? 7. The aortic  root and ascending aorta are normal in size and structure.  ? 8. Right atrial pressure is estimated at 3 mmHg.  ? 9. No complete TR doppler jet so unable to estimate PA systolic pressure.  ? ?03/29/17: TT

## 2021-08-14 ENCOUNTER — Other Ambulatory Visit: Payer: Self-pay | Admitting: Physician Assistant

## 2021-08-14 ENCOUNTER — Ambulatory Visit: Payer: Medicaid Other

## 2021-08-14 ENCOUNTER — Other Ambulatory Visit: Payer: Self-pay

## 2021-08-14 ENCOUNTER — Encounter: Payer: Self-pay | Admitting: Physician Assistant

## 2021-08-14 ENCOUNTER — Ambulatory Visit (INDEPENDENT_AMBULATORY_CARE_PROVIDER_SITE_OTHER): Payer: Medicaid Other | Admitting: Physician Assistant

## 2021-08-14 VITALS — BP 108/72 | HR 81 | Ht 68.0 in | Wt 206.8 lb

## 2021-08-14 DIAGNOSIS — I10 Essential (primary) hypertension: Secondary | ICD-10-CM

## 2021-08-14 DIAGNOSIS — R002 Palpitations: Secondary | ICD-10-CM | POA: Diagnosis not present

## 2021-08-14 DIAGNOSIS — I5032 Chronic diastolic (congestive) heart failure: Secondary | ICD-10-CM

## 2021-08-14 DIAGNOSIS — I420 Dilated cardiomyopathy: Secondary | ICD-10-CM | POA: Diagnosis not present

## 2021-08-14 NOTE — Progress Notes (Unsigned)
Enrolled patient for a 7 day Zio XT monitor to be mailed to patients home.  

## 2021-08-14 NOTE — Patient Instructions (Signed)
Medication Instructions:  ? ?Your physician recommends that you continue on your current medications as directed. Please refer to the Current Medication list given to you today. ? ?*If you need a refill on your cardiac medications before your next appointment, please call your pharmacy* ? ? ?Lab Work:  BMET TODAY  ? ?If you have labs (blood work) drawn today and your tests are completely normal, you will receive your results only by: ?MyChart Message (if you have MyChart) OR ?A paper copy in the mail ?If you have any lab test that is abnormal or we need to change your treatment, we will call you to review the results. ? ? ?Testing/Procedures: Your physician has requested that you have an echocardiogram. Echocardiography is a painless test that uses sound waves to create images of your heart. It provides your doctor with information about the size and shape of your heart and how well your heart?s chambers and valves are working. This procedure takes approximately one hour. There are no restrictions for this procedure. ? ? ?Your physician has recommended that you wear an event monitor. Event monitors are medical devices that record the heart?s electrical activity. Doctors most often [place] these monitors to diagnose arrhythmias. Arrhythmias are problems with the speed or rhythm of the heartbeat. The monitor is a small, portable device. You can wear one while you do your normal daily activities. This is usually used to diagnose what is causing palpitations/syncope (passing out). ? ?Follow-Up: ?At [facility], you and your health needs are our priority.  As part of our continuing mission to provide you with exceptional heart care, we have created designated Provider Care Teams.  These Care Teams include your primary Cardiologist (physician) and Advanced Practice Providers (APPs -  Physician Assistants and Nurse Practitioners) who all work together to provide you with the care you need, when you need it. ? ?We recommend  signing up for the patient portal called "MyChart".  Sign up information is provided on this After Visit Summary.  MyChart is used to connect with patients for Virtual Visits (Telemedicine).  Patients are able to view lab/test results, encounter notes, upcoming appointments, etc.  Non-urgent messages can be sent to your provider as well.   ?To learn more about what you can do with MyChart, go to [email or web address].   ? ?Your next appointment:   ?3-4  month(s) ? ?The format for your next appointment:   ?In Person ? ?Provider:   ?Tommye Standard, PA-C{ ? ? ? ?Other Instructions ? ? ?ZIO XT- Long Term Monitor Instructions ? ?Your physician has requested you wear a ZIO patch monitor for 14 days.  ?This is a single patch monitor. [employer] supplies one patch monitor per enrollment. Additional ?stickers are not available. Please do not apply patch if you will be having a Nuclear Stress Test,  ?Echocardiogram, Cardiac CT, MRI, or Chest Xray during the period you would be wearing the  ?monitor. The patch cannot be worn during these tests. You cannot remove and re-apply the  ?ZIO XT patch monitor.  ?Your ZIO patch monitor will be mailed 3 day USPS to your address on file. It may take 3-5 days  ?to receive your monitor after you have been enrolled.  ?Once you have received your monitor, please review the enclosed instructions. Your monitor  ?has already been registered assigning a specific monitor serial # to you. ? ?Billing and Patient Assistance Program Information ? ?We have supplied [employer] with any of your insurance information on file for billing  purposes. ?[employer] offers a sliding scale Patient Assistance Program for patients that do not have  ?insurance, or whose insurance does not completely cover the cost of the ZIO monitor.  ?You must apply for the Patient Assistance Program to qualify for this discounted rate.  ?To apply, please call [employer] at [phone], select option 4, select option 2, ask to apply for   ?Patient Assistance Program. Theodore Demark will ask your household income, and how many people  ?are in your household. They will quote your out-of-pocket cost based on that information.  ?[employer] will also be able to set up a 94-month interest-free payment plan if needed. ? ?Applying the monitor ?  ?Shave hair from upper left chest.  ?Hold abrader disc by orange tab. Rub abrader in 40 strokes over the upper left chest as  ?indicated in your monitor instructions.  ?Clean area with 4 enclosed alcohol pads. Let dry.  ?Apply patch as indicated in monitor instructions. Patch will be placed under collarbone on left  ?side of chest with arrow pointing upward.  ?Rub patch adhesive wings for 2 minutes. Remove white label marked "1". Remove the white  ?label marked "2". Rub patch adhesive wings for 2 additional minutes.  ?While looking in a mirror, press and release button in center of patch. A small green light will  ?flash 3-4 times. This will be your only indicator that the monitor has been turned on.  ?Do not shower for the first 24 hours. You may shower after the first 24 hours.  ?Press the button if you feel a symptom. You will hear a small click. Record Date, Time and  ?Symptom in the Patient Logbook.  ?When you are ready to remove the patch, follow instructions on the last 2 pages of Patient  ?Logbook. Stick patch monitor onto the last page of Patient Logbook.  ?Place Patient Logbook in the blue and white box. Use locking tab on box and tape box closed  ?securely. The blue and white box has prepaid postage on it. Please place it in the mailbox as  ?soon as possible. Your physician should have your test results approximately 7 days after the  ?monitor has been [facility]  ?Call [facility] [phone] you have questions regarding  ?your ZIO XT patch monitor. Call them immediately if you see an orange light blinking on your  ?monitor.  ?If your monitor falls off in less than 4  days, contact our Monitor department at [phone]  ?If your monitor becomes loose or falls off after 4 days call [employer] at [phone]  ?suggestions on securing your monitor ? ?

## 2021-08-15 LAB — BASIC METABOLIC PANEL
BUN/Creatinine Ratio: 18 (ref 9–23)
BUN: 14 mg/dL (ref 6–24)
CO2: 21 mmol/L (ref 20–29)
Calcium: 9.6 mg/dL (ref 8.7–10.2)
Chloride: 105 mmol/L (ref 96–106)
Creatinine, Ser: 0.78 mg/dL (ref 0.57–1.00)
Glucose: 87 mg/dL (ref 70–99)
Potassium: 4.5 mmol/L (ref 3.5–5.2)
Sodium: 140 mmol/L (ref 134–144)
eGFR: 89 mL/min/{1.73_m2} (ref >59)

## 2021-08-16 ENCOUNTER — Other Ambulatory Visit: Payer: Self-pay | Admitting: Critical Care Medicine

## 2021-08-18 ENCOUNTER — Telehealth: Payer: Self-pay | Admitting: Critical Care Medicine

## 2021-08-18 NOTE — Telephone Encounter (Signed)
Requested medication (s) are due for refill today: yes ? ?Requested medication (s) are on the active medication list: yes ? ?Last refill:  07/21/21 #30/0 ? ?Future visit scheduled: yes ? ?Notes to clinic:  Unable to refill per protocol, cannot delegate. ? ? ?  ?Requested Prescriptions  ?Pending Prescriptions Disp Refills  ? tiZANidine (ZANAFLEX) 4 MG tablet [Pharmacy Med Name: TIZANIDINE HCL 4 MG TABLET] 30 tablet 0  ?  Sig: Take 1 tablet (4 mg total) by mouth every 8 (eight) hours as needed for muscle spasms. DO NOT DRINK ALCOHOL OR DRIVE WHILE TAKING THIS MEDICATION  ?  ? Not Delegated - Cardiovascular:  Alpha-2 Agonists - tizanidine Failed - 08/16/2021  2:32 PM  ?  ?  Failed - This refill cannot be delegated  ?  ?  Passed - Valid encounter within last 6 months  ?  Recent Outpatient Visits   ? ?      ? 4 weeks ago Chronic systolic congestive heart failure ([place])  ? [place] Elsie Stain, MD  ? 2 years ago Vertigo  ? [place], Charlane Ferretti, MD  ? 2 years ago Nonischemic dilated cardiomyopathy [facility])  ? [place], Charlane Ferretti, MD  ? 2 years ago Chronic bilateral low back pain with left-sided sciatica  ? [place], [place] W, NP  ? 4 years ago Chronic combined systolic and diastolic CHF (congestive heart failure) ([place])  ? [place] Charlott Rakes, MD  ? ?  ?  ?Future Appointments   ? ?        ? In 1 month Joya Gaskins Burnett Harry, MD [facility]  ? In 4 months [place], PA-C [place]  ? ?  ? ?  ?  ?  ? ?

## 2021-08-18 NOTE — Telephone Encounter (Signed)
Copied from [place] [phone]. Topic: General - Other >> Aug 17, 2021  2:03 PM Tessa Lerner A wrote: Reason for CRM: The patient has called requesting to speak with Etter Sjogren when possible   The patient has been unable to find housing currently and is requesting additional assistance

## 2021-08-20 NOTE — Telephone Encounter (Signed)
Call returned to patient. She is still residing at a motel and looking for permanent housing.  She said that she received a  message from [facility] but has not been able to reach anyone despite calling their office multiple times.  Provided her with another number for [facility] # [phone] and encouraged her to call again and to call this CM back if she is not able to reach anyone.  ? ?I sent an email to [place] her that the patient has not been able to reach [facility].  ?

## 2021-08-24 DIAGNOSIS — M5416 Radiculopathy, lumbar region: Secondary | ICD-10-CM | POA: Diagnosis not present

## 2021-08-25 ENCOUNTER — Ambulatory Visit (HOSPITAL_COMMUNITY): Payer: Medicaid Other | Attending: Physician Assistant

## 2021-08-25 ENCOUNTER — Telehealth: Payer: Self-pay | Admitting: Critical Care Medicine

## 2021-08-25 DIAGNOSIS — I420 Dilated cardiomyopathy: Secondary | ICD-10-CM | POA: Diagnosis not present

## 2021-08-25 LAB — ECHOCARDIOGRAM COMPLETE
Area-P 1/2: 4.16 cm2
S' Lateral: 3.6 cm

## 2021-08-25 NOTE — Telephone Encounter (Signed)
Copied from [place] [phone]. Topic: General - Inquiry >> Aug 25, 2021  2:15 PM Greggory Keen D wrote: Reason for CRM: Pt would like Opal Sidles to give her a call .  She wants to talk to her about housing.

## 2021-08-26 ENCOUNTER — Telehealth: Payer: Self-pay | Admitting: *Deleted

## 2021-08-26 NOTE — Telephone Encounter (Signed)
Lvm to call back direct number [phone] ?

## 2021-08-26 NOTE — Telephone Encounter (Signed)
Call returned to patient # [id number], message left with call back requested to this CM. ?

## 2021-08-26 NOTE — Telephone Encounter (Signed)
-----   Message from Baldwin [place], [place] sent at 08/25/2021  5:29 PM EDT ----- ?Echo looks good, heart strength remains normal. ?

## 2021-08-31 ENCOUNTER — Telehealth: Payer: Self-pay | Admitting: Clinical

## 2021-08-31 ENCOUNTER — Institutional Professional Consult (permissible substitution): Payer: Medicaid Other | Admitting: Clinical

## 2021-08-31 NOTE — Telephone Encounter (Signed)
I called pt. She stated that she was unable to make appt. She stated that she learned she has to pay $4000 to housing authority which includes court fees from 2017. Reports that she continues to stay in motels and sleep in her. She stated that she will not stay in a shelter and that she is "better than that". She stated that she does not ask anyone for help in her family. Reports that she continues to work. Reports that she is going to work towards paying $4000. LCSW encouraged pt to go to the [facility]. LCSW will refer pt for outpatient therapy. ?

## 2021-09-23 ENCOUNTER — Encounter: Payer: Self-pay | Admitting: Critical Care Medicine

## 2021-09-23 ENCOUNTER — Ambulatory Visit: Payer: Medicaid Other | Attending: Critical Care Medicine | Admitting: Critical Care Medicine

## 2021-09-23 VITALS — BP 129/86 | HR 87 | Wt 207.0 lb

## 2021-09-23 DIAGNOSIS — E785 Hyperlipidemia, unspecified: Secondary | ICD-10-CM | POA: Diagnosis not present

## 2021-09-23 DIAGNOSIS — E876 Hypokalemia: Secondary | ICD-10-CM | POA: Diagnosis not present

## 2021-09-23 DIAGNOSIS — I5022 Chronic systolic (congestive) heart failure: Secondary | ICD-10-CM

## 2021-09-23 DIAGNOSIS — M5416 Radiculopathy, lumbar region: Secondary | ICD-10-CM

## 2021-09-23 DIAGNOSIS — Z5900 Homelessness unspecified: Secondary | ICD-10-CM | POA: Diagnosis not present

## 2021-09-23 DIAGNOSIS — I10 Essential (primary) hypertension: Secondary | ICD-10-CM

## 2021-09-23 DIAGNOSIS — Z1231 Encounter for screening mammogram for malignant neoplasm of breast: Secondary | ICD-10-CM

## 2021-09-23 DIAGNOSIS — I420 Dilated cardiomyopathy: Secondary | ICD-10-CM

## 2021-09-23 MED ORDER — SACUBITRIL-VALSARTAN 24-26 MG PO TABS: 1.0000 | ORAL_TABLET | Freq: Two times a day (BID) | ORAL | 0 refills | Status: DC

## 2021-09-23 MED ORDER — TIZANIDINE HCL 4 MG PO TABS: 4.0000 mg | ORAL_TABLET | Freq: Three times a day (TID) | ORAL | 0 refills | Status: DC | PRN

## 2021-09-23 MED ORDER — DICLOFENAC SODIUM 1 % EX GEL: 4.0000 g | Freq: Four times a day (QID) | CUTANEOUS | 0 refills | Status: DC

## 2021-09-23 MED ORDER — METOPROLOL SUCCINATE ER 50 MG PO TB24: 50.0000 mg | ORAL_TABLET | Freq: Every day | ORAL | 2 refills | Status: DC

## 2021-09-23 MED ORDER — SPIRONOLACTONE 25 MG PO TABS: 12.5000 mg | ORAL_TABLET | Freq: Every day | ORAL | 2 refills | Status: DC

## 2021-09-23 MED ORDER — SIMVASTATIN 10 MG PO TABS: ORAL_TABLET | ORAL | 4 refills | Status: DC

## 2021-09-23 MED ORDER — TORSEMIDE 20 MG PO TABS: 20.0000 mg | ORAL_TABLET | Freq: Every day | ORAL | 2 refills | Status: DC

## 2021-09-23 MED ORDER — POTASSIUM CHLORIDE CRYS ER 20 MEQ PO TBCR: EXTENDED_RELEASE_TABLET | ORAL | 2 refills | Status: DC

## 2021-09-23 MED ORDER — LIDOCAINE 5 % EX PTCH: 1.0000 | MEDICATED_PATCH | CUTANEOUS | 0 refills | Status: DC

## 2021-09-23 NOTE — Telephone Encounter (Signed)
Met with the patient when she was in the clinic today.  She has been working and continues to stay at [employer]. ?[employer] [place] called her and informed her that her issues regarding housing are outside of their scope. They instructed her to call the [employer] about the back rent/bill.  ? ?The patient said she went to the [employer] and was informed that she owes $3000.  She explained that it is regarding and issue with her son and her lease. ? ?She is interested in finding a permanent residence and said that she could spend up to $600/month.  I provided her with the listing of available apartments in [place] within her price range on [email or web address].  I also gave her the phone numbers for [employer], [place] and encouraged her to reach out to each of the agencies for assistance.  She is aware that there is no immediate housing available and she does not want to reside in a shelter.  ?Instructed her to call me back with any questions/ concerns.  ?

## 2021-09-23 NOTE — Assessment & Plan Note (Signed)
Patient had connection with case manager for resources ?

## 2021-09-23 NOTE — Assessment & Plan Note (Signed)
Continue with Voltaren and lidocaine patch ?

## 2021-09-23 NOTE — Assessment & Plan Note (Signed)
Hypertension at goal no change in medications will need to assess renal panel with the patient being on Entresto and Aldactone and potassium supplement ?

## 2021-09-23 NOTE — Patient Instructions (Signed)
Labs today include kidney panel ? ?[facility] on all [facility] ? ?Please get your dental care at the [facility] dental resource we gave you ? ?Mammogram will be reordered ? ?Please process your colon cancer screening kit turn it back in ? ?We will schedule an appointment for Pap smear when the providers in the office ? ?Return to see Dr. Joya Gaskins 3 months ? ?Our clinical case manager Jane connected you with resources for housing ? ? ?

## 2021-09-23 NOTE — Progress Notes (Signed)
? ?Established Patient Office Visit ? ?Subjective:  ?[name], female    DOB: 1965-08-16  Age: 56 y.o. MRN: [id number] ? ?CC:  ?No chief complaint on file. ? ? ?HPI ?07/21/21 ?Chelsea Branch presents for primary care to establish.  Patient used to see Dr. Margarita Rana but has not been seen in this office since 2 early 2021.  The patient currently lives in her car and is homeless.  She has chronic history of dyspnea on exertion alopecia the left leg swelling.  She has an IUD in place has been there for 3 years.  She has history of uterine fibroids and is having discomfort in the pelvic area.  She does need a Pap smear as well.  Patient is suffering from anxiety and depression over being homeless.  She has an upcoming appointment with cardiology in March and she is encouraged to keep this.  She has history of diastolic heart failure and is on antihypertensive therapy for same. ?Patient will need an updated echocardiogram.  She uses the emergency room frequently including several visits for influenza this past winter.  She needs an updated mammogram.  Patient needs fecal occult kit for colon cancer screening as well. ?She did not get a flu vaccine this past year but did have influenza twice so does not need the flu vaccine now. ? ?5/3 ?Patient seen in return follow-up, patient was seen by cardiology echocardiogram was normal she ended up not getting the Zio patch.  Patient is now maintaining Entresto furosemide Aldactone beta-blocker.  She needs follow-up labs.  She does take potassium supplementation.  Patient never had her mammogram performed she has not processed her colon cancer screening nor is she had her Pap smear. ?Patient still living in her car most of the time occasionally in motels.  She works at a [employer].  She tries to eat healthy.  She has a cavity in her left lower molar that needs to be attended to.  She does have Medicaid.  On arrival blood pressure 129/86.   Patient does have constipation as well.  The patient has a pending appointment with dermatology ? ?Below is documentation from last cardiology visit ?Saw Cards 08/14/21 ?1. NICM ?2. Chronic CHF > ?HFpEF ?    No exam findings of volume OL ?    on BB, Entresto, lasix/K+, aldactone ?  ?Some nocturnal/HS palpitations, mild SOB/orthopnea ?No PND ?No daytime symptoms ?  ?Last K+ was 5, will get a BMET ?Plan 7 day Xio XT ?Update her echo ?  ?3. HTN ?    Looks OK, no changes ?  ?4. HLD ?    Not addressed today ?     ?  ?5. OSA? ?She reports being tested but not required CPAP ?  ?Echo is normal ? ?  ?Disposition:  will have her back in 3-4 mo, sooner if needed  ?  ?Current medicines are reviewed at length with the patient today.  The patient did not have any concerns regarding medicines. ?  ? ?Past Medical History:  ?Diagnosis Date  ? Abnormal thyroid blood test 03/15/2017  ? Abnormal uterine bleeding   ? Alopecia   ? Anemia   ? Angina pectoris with normal coronary arteriogram [facility]) 2017  ? Had + Troponin c/w ? NSTEMI due to A on C CHF  ? 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 Chronic combined systolic and diastolic CHF (congestive heart failure) ([place])   ? DYSLIPIDEMIA   ? Essential hypertension   ?  Fibroids   ? H/O noncompliance with medical treatment, presenting hazards to health   ? Hypertension   ? Hypokalemia 07/23/2016  ? Menorrhagia   ? Nonischemic cardiomyopathy ([place]) 1994; 2017  ? a. iniatially ?2/2 peripartum in 1994 - improved by 2008 then worsening EF in 2011 back down to EF 25-30%. b. echo 01/21/14 showed mod LVH, [name]; [name], [name], [name],   ? Nonischemic dilated cardiomyopathy ([place]) 06/17/2015  ? NSTEMI (non-ST elevated myocardial infarction) ([place]) 05/2015  ? Normal Coronaries.  ? Peripartum cardiomyopathy 1994  ? Sleep apnea 2015  ? CPAP 12/2013  ? Stroke [facility]) 2011  ? Systolic and diastolic CHF, acute on chronic ([place]) 06/14/2015  ? Tobacco abuse   ? ? ?Past Surgical History:   ?Procedure Laterality Date  ? CARDIAC CATHETERIZATION N/A 06/16/2015  ? Procedure: Left Heart Cath and Coronary Angiography;  Surgeon: Jettie Booze, MD;  Location: [place] CV [facility];  Service: Cardiovascular;  Laterality: N/A;  ? [place]  ? LOOP RECORDER IMPLANT  ~ 2000  ? TIBIAL TUBERCLERPLASTY    ? TUBAL LIGATION  1994  ? ? ?Family History  ?Problem Relation Age of Onset  ? Cancer Maternal Grandmother   ?     uterine  ? Hypertension Sister   ? Healthy Mother   ? Other Neg Hx   ? Heart disease Neg Hx   ? ? ?Social History  ? ?Socioeconomic History  ? Marital status: Single  ?  Spouse name: Not on file  ? Number of children: 3  ? Years of education: 53  ? Highest education level: Not on file  ?Occupational History  ? Occupation: unemployed  ?Tobacco Use  ? Smoking status: Former  ?  Packs/day: 0.10  ?  Years: 30.00  ?  Pack years: 3.00  ?  Types: Cigarettes  ?  Quit date: 06/03/2015  ?  Years since quitting: 6.3  ? Smokeless tobacco: Never  ? Tobacco comments:  ?  Pt. stated she stopped smoking a year ago. 09/29/2018  ?Vaping Use  ? Vaping Use: Never used  ?Substance and Sexual Activity  ? Alcohol use: No  ?  Alcohol/week: 0.0 standard drinks  ? Drug use: No  ? Sexual activity: Yes  ?  Birth control/protection: I.U.D.  ?  Comment: fibroids  ?Other Topics Concern  ? Not on file  ?Social History Narrative  ? Lives at home with 57 yo twins ([place] and [place])  ? 74 yo son lives outside the home  ? 55 yo granddaughter   ? ?Social Determinants of Health  ? ?Financial Resource Strain: Not on file  ?Food Insecurity: Not on file  ?Transportation Needs: Not on file  ?Physical Activity: Not on file  ?Stress: Not on file  ?Social Connections: Not on file  ?Intimate Partner Violence: Not on file  ? ? ?Outpatient Medications Prior to Visit  ?Medication Sig Dispense Refill  ? acetaminophen (TYLENOL) 500 MG tablet Take 1 tablet (500 mg total) by mouth every 6 (six) hours as needed. 30 tablet 0  ?  albuterol (VENTOLIN HFA) 108 (90 Base) MCG/ACT inhaler Inhale 1-2 puffs into the lungs every 6 (six) hours as needed for wheezing or shortness of breath. 18 g 2  ? Blood Pressure Monitoring (BLOOD PRESSURE KIT) DEVI Use to measure blood pressure twice daily 1 each 0  ? fluticasone (FLONASE) 50 MCG/ACT nasal spray Place 1 spray into both nostrils  in the morning and at bedtime. 16 g 2  ? ibuprofen (ADVIL) 800 MG tablet Take 1 tablet (800 mg total) by mouth every 8 (eight) hours as needed for moderate pain. 30 tablet 0  ? magnesium oxide (MAG-OX) 400 MG tablet TAKE 1 CAPSULE (400 MG TOTAL) BY MOUTH 2 (TWO) TIMES DAILY. 180 tablet 2  ? Misc. Devices MISC Please provide patient with insurance approved straight cane 1 each 0  ? Misc. Devices MISC Single-point cane with adjustable height x1, shower chair x1, transfer bench x1.  Diagnosis congestive heart failure, degenerative disc disease of the lumbar spine. 1 each 0  ? diclofenac Sodium (VOLTAREN) 1 % GEL Apply 4 g topically 4 (four) times daily. 100 g 0  ? lidocaine (LIDODERM) 5 % Place 1 patch onto the skin daily. Remove & Discard patch within 12 hours or as directed by MD 30 patch 0  ? metoprolol succinate (TOPROL-XL) 50 MG 24 hr tablet Take 1 tablet (50 mg total) by mouth daily. Please keep upcoming appt with Cardiologist in March 2023 before anymore refills. Thank you Final attempt 90 tablet 2  ? ondansetron (ZOFRAN) 4 MG tablet Take 1 tablet (4 mg total) by mouth every 6 (six) hours. 12 tablet 0  ? potassium chloride SA (KLOR-CON M20) 20 MEQ tablet TAKE 1-2 TABS BY MOUTH AS DIRECTED. TAKE 2 TABLETS IN THE MORNING AND 1 TABLET IN THE EVENING 90 tablet 2  ? sacubitril-valsartan (ENTRESTO) 24-26 MG Take 1 tablet by mouth 2 (two) times daily. >>SEE DIRECTIONS AS YOUR DOSE HAS CHANGED<< 180 tablet 0  ? simvastatin (ZOCOR) 10 MG tablet TAKE 1 TABLET BY MOUTH EVERY DAY IN THE EVENING. Please make overdue appt with Dr. Caryl Comes before anymore refills. Thank you 1st attempt 60  tablet 4  ? spironolactone (ALDACTONE) 25 MG tablet Take 0.5 tablets (12.5 mg total) by mouth daily. 60 tablet 2  ? tiZANidine (ZANAFLEX) 4 MG tablet TAKE 1 TABLET (4 MG TOTAL) BY MOUTH EVERY 8 (EIGHT) HOURS AS NEEDE

## 2021-09-23 NOTE — Assessment & Plan Note (Signed)
-  Continue statins ?

## 2021-09-23 NOTE — Assessment & Plan Note (Signed)
Last echocardiogram showed improved function ?

## 2021-09-23 NOTE — Assessment & Plan Note (Signed)
Assess renal panel 

## 2021-09-24 LAB — BASIC METABOLIC PANEL
BUN/Creatinine Ratio: 13 (ref 9–23)
BUN: 11 mg/dL (ref 6–24)
CO2: 27 mmol/L (ref 20–29)
Calcium: 9.5 mg/dL (ref 8.7–10.2)
Chloride: 102 mmol/L (ref 96–106)
Creatinine, Ser: 0.86 mg/dL (ref 0.57–1.00)
Glucose: 109 mg/dL — ABNORMAL HIGH (ref 70–99)
Potassium: 4.3 mmol/L (ref 3.5–5.2)
Sodium: 142 mmol/L (ref 134–144)
eGFR: 79 mL/min/{1.73_m2} (ref >59)

## 2021-09-28 NOTE — Telephone Encounter (Signed)
Pt is calling to ask [place] when is she supposed to come to come in for a visit? ? ?Please advise ?CB- [phone] ?

## 2021-09-29 NOTE — Telephone Encounter (Signed)
Called and left vm  °

## 2021-10-06 ENCOUNTER — Encounter: Payer: Medicaid Other | Admitting: Family Medicine

## 2021-10-07 DIAGNOSIS — M5416 Radiculopathy, lumbar region: Secondary | ICD-10-CM | POA: Diagnosis not present

## 2021-10-17 ENCOUNTER — Other Ambulatory Visit: Payer: Self-pay

## 2021-10-17 ENCOUNTER — Ambulatory Visit (HOSPITAL_COMMUNITY)
Admission: EM | Admit: 2021-10-17 | Discharge: 2021-10-17 | Disposition: A | Discharge status: Home / Self Care | Payer: Medicaid Other | Attending: Internal Medicine | Admitting: Internal Medicine

## 2021-10-17 ENCOUNTER — Encounter (HOSPITAL_COMMUNITY): Payer: Self-pay | Admitting: *Deleted

## 2021-10-17 DIAGNOSIS — R519 Headache, unspecified: Secondary | ICD-10-CM | POA: Diagnosis not present

## 2021-10-17 DIAGNOSIS — Z113 Encounter for screening for infections with a predominantly sexual mode of transmission: Secondary | ICD-10-CM | POA: Insufficient documentation

## 2021-10-17 DIAGNOSIS — M545 Low back pain, unspecified: Secondary | ICD-10-CM | POA: Diagnosis not present

## 2021-10-17 DIAGNOSIS — G8929 Other chronic pain: Secondary | ICD-10-CM | POA: Insufficient documentation

## 2021-10-17 DIAGNOSIS — Z202 Contact with and (suspected) exposure to infections with a predominantly sexual mode of transmission: Secondary | ICD-10-CM

## 2021-10-17 DIAGNOSIS — M25512 Pain in left shoulder: Secondary | ICD-10-CM | POA: Insufficient documentation

## 2021-10-17 DIAGNOSIS — S46912A Strain of unspecified muscle, fascia and tendon at shoulder and upper arm level, left arm, initial encounter: Secondary | ICD-10-CM | POA: Diagnosis not present

## 2021-10-17 LAB — POCT URINALYSIS DIPSTICK, ED / UC
Bilirubin Urine: NEGATIVE
Glucose, UA: NEGATIVE mg/dL
Hgb urine dipstick: NEGATIVE
Ketones, ur: NEGATIVE mg/dL
Leukocytes,Ua: NEGATIVE
Nitrite: NEGATIVE
Protein, ur: NEGATIVE mg/dL
Specific Gravity, Urine: 1.01 (ref 1.005–1.030)
Urobilinogen, UA: 0.2 mg/dL (ref 0.0–1.0)
pH: 5.5 (ref 5.0–8.0)

## 2021-10-17 LAB — HIV ANTIBODY (ROUTINE TESTING W REFLEX): HIV Screen 4th Generation wRfx: NONREACTIVE

## 2021-10-17 MED ORDER — IBUPROFEN 800 MG PO TABS: 800.0000 mg | ORAL_TABLET | Freq: Three times a day (TID) | ORAL | 0 refills | Status: DC | PRN

## 2021-10-17 MED ORDER — TIZANIDINE HCL 4 MG PO TABS: 4.0000 mg | ORAL_TABLET | Freq: Three times a day (TID) | ORAL | 0 refills | Status: DC | PRN

## 2021-10-17 NOTE — ED Provider Notes (Signed)
[place]    CSN: [id number] Arrival date & time: 10/17/21  1001      History   Chief Complaint Chief Complaint  Patient presents with   SEXUALLY TRANSMITTED DISEASE   Shoulder Injury   Facial Swelling    HPI Chelsea Branch is a 56 y.o. female.   59-year-old female presents with left facial, left shoulder, lower back pain.  Patient also desires STI screening.  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  Patient relates she works 8 to 10 hours a day.  Patient relates over the past several days she has had increasing left facial discomfort left shoulder pain, soreness, that is worse with overhead motion.  Patient indicates she is also having lower back pain in the middle portion of the lower back, with the pain radiating into both legs, and occasional intermittent numbness down into the left leg.  Patient relates she has not had any trauma to the area and has not been able to take anything for relief.  Patient is concerned that her job is contributing to the symptoms that she is having.  Patient also relates that she has a cavity in the left lower molar area and that this may also contribute to her left facial pain.  Patient relates she is not having any weakness, numbness, tingling of the upper extremities or the face, and relates her strength is normal.  No fever or chills.  No history of trauma.  Patient also desires to have STI screening.  Patient relates that her last sexual intercourse was about a month ago, this was unprotected.  Patient relates that she is desiring to have blood drawn, along with the routine screening.  Patient relates she has had some increased urination but no dysuria.   Shoulder Injury   Past Medical History:  Diagnosis Date   Abnormal thyroid blood test 03/15/2017   Abnormal uterine bleeding    Alopecia    Anemia     Angina pectoris with normal coronary arteriogram ([place]) 2017   Had + Troponin c/w ? 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 iniatially ?2/2 peripartum in 1994 - improved by 2008 then worsening EF in 2011 back down to EF 25-30%. b. echo 01/21/14 showed mod LVH, [name]; 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   Peripartum cardiomyopathy 1994   Sleep apnea 2015   CPAP 12/2013   Stroke ([place]) 8768   Systolic and diastolic CHF, acute on chronic ([place]) 06/14/2015   Tobacco abuse     Patient Active Problem List   Diagnosis Date Noted   Spinal stenosis of lumbar region 07/21/2021   Lumbar radiculitis 07/21/2021   COPD exacerbation ([place]) 07/21/2021   Mechanical complication due to intrauterine contraceptive device 07/21/2021   Arthropathy of lumbar facet joint 03/14/2019   Degenerative disc disease at  L5-S1 level 10/10/2018   Abnormal thyroid blood test 03/15/2017   Hypokalemia 07/23/2016   Low TSH level 04/26/2016   Abnormal uterine bleeding 04/06/2016   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   History of non-ST elevation myocardial infarction (NSTEMI) 06/14/2015   Low back pain 09/16/2014   Homelessness 09/16/2014   Vitamin D deficiency 01/29/2014   OSA (obstructive sleep apnea) 01/08/2014   Chronic combined systolic and diastolic CHF (congestive heart failure) ([place]) 08/14/2013   Essential hypertension    Fibroids 08/09/2012   Menorrhagia 05/08/2012   Female pattern hair loss    HLD  (hyperlipidemia) 06/08/2010   CEREBRAL ANEURYSM 06/08/2010   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   CEREBROVASCULAR ACCIDENT, HX OF 06/08/2010    Past Surgical History:  Procedure Laterality Date   CARDIAC CATHETERIZATION N/A 06/16/2015   Procedure: Left Heart Cath and Coronary Angiography;  Surgeon: Jettie Booze, MD;  Location: [facility];  Service: Cardiovascular;  Laterality: N/A;   CESAREAN SECTION  1992  1994   LOOP RECORDER IMPLANT  ~ [place]     TUBAL LIGATION  1994    OB History     Gravida  2   Para  2   Term  2   Preterm      AB      Living  3      SAB      IAB      Ectopic      Multiple  1   Live Births  3            Home Medications    Prior to Admission medications   Medication Sig Start Date End Date Taking? Authorizing Provider  acetaminophen (TYLENOL) 500 MG tablet Take 1 tablet (500 mg total) by mouth every 6 (six) hours as needed. 05/19/21   Hazel Sams, PA-C  albuterol (VENTOLIN HFA) 108 (90 Base) MCG/ACT inhaler Inhale 1-2 puffs into the lungs every 6 (six) hours as needed for wheezing or shortness of breath. 07/21/21   Elsie Stain, MD  Blood Pressure Monitoring (BLOOD PRESSURE KIT) DEVI Use to measure blood pressure twice daily 07/22/21   Elsie Stain, MD  diclofenac Sodium (VOLTAREN) 1 % GEL Apply 4 g topically 4 (four) times daily. 09/23/21   Elsie Stain, MD  fluticasone (FLONASE) 50 MCG/ACT nasal spray Place 1 spray into both nostrils in the morning and at bedtime. 07/21/21   Elsie Stain, MD  ibuprofen (ADVIL) 800 MG tablet Take 1 tablet (800 mg total) by mouth every 8 (eight) hours as needed for moderate pain. 10/17/21   Nyoka Lint, PA-C  lidocaine (LIDODERM) 5 % Place 1 patch onto the skin daily. Remove & Discard patch within 12 hours or as directed by MD 09/23/21   Elsie Stain, MD  magnesium oxide (MAG-OX) 400 MG tablet TAKE 1 CAPSULE (400 MG TOTAL) BY MOUTH 2 (TWO)  TIMES DAILY. 07/21/21   Elsie Stain, MD  metoprolol succinate (TOPROL-XL) 50 MG 24 hr tablet Take 1 tablet (50 mg total) by mouth daily. Please keep upcoming appt with Cardiologist in March 2023 before anymore refills. Thank you Final attempt 09/23/21   Elsie Stain, MD  Misc. Devices MISC Please provide patient with insurance approved straight cane 09/29/18   Gildardo Pounds, NP  Misc. Devices MISC Single-point cane with adjustable height x1, shower chair x1, transfer bench x1.  Diagnosis congestive heart failure, degenerative disc disease of the lumbar  spine. 11/23/18   Charlott Rakes, MD  potassium chloride SA (KLOR-CON M20) 20 MEQ tablet TAKE 1-2 TABS BY MOUTH AS DIRECTED. TAKE 2 TABLETS IN THE MORNING AND 1 TABLET IN THE EVENING 09/23/21   Elsie Stain, MD  sacubitril-valsartan (ENTRESTO) 24-26 MG Take 1 tablet by mouth 2 (two) times daily. >>SEE DIRECTIONS AS YOUR DOSE HAS CHANGED<< 09/23/21   Elsie Stain, MD  simvastatin (ZOCOR) 10 MG tablet TAKE 1 TABLET BY MOUTH EVERY DAY IN THE EVENING. Please make overdue appt with Dr. Caryl Comes before anymore refills. Thank you 1st attempt 09/23/21   Elsie Stain, MD  spironolactone (ALDACTONE) 25 MG tablet Take 0.5 tablets (12.5 mg total) by mouth daily. 09/23/21   Elsie Stain, MD  tiZANidine (ZANAFLEX) 4 MG tablet Take 1 tablet (4 mg total) by mouth every 8 (eight) hours as needed for muscle spasms. DO NOT DRINK ALCOHOL OR DRIVE WHILE TAKING THIS MEDICATION 10/17/21   Nyoka Lint, PA-C  torsemide (DEMADEX) 20 MG tablet Take 1 tablet (20 mg total) by mouth daily. 09/23/21   Elsie Stain, MD  DULoxetine (CYMBALTA) 30 MG capsule Take 1 capsule (30 mg total) by mouth daily. 12/07/18 12/10/19  Charlott Rakes, MD  famotidine (PEPCID) 20 MG tablet Take 1 tablet (20 mg total) 2 (two) times daily by mouth. 04/10/17 11/03/19  Albesa Seen, PA-C    Family History Family History  Problem Relation Age of Onset   Cancer Maternal Grandmother         uterine   Hypertension Sister    Healthy Mother    Other Neg Hx    Heart disease Neg Hx     Social History Social History   Tobacco Use   Smoking status: Former    Packs/day: 0.10    Years: 30.00    Pack years: 3.00    Types: Cigarettes    Quit date: 06/03/2015    Years since quitting: 6.3   Smokeless tobacco: Never   Tobacco comments:    Pt. stated she stopped smoking a year ago. 09/29/2018  Vaping Use   Vaping Use: Never used  Substance Use Topics   Alcohol use: No    Alcohol/week: 0.0 standard drinks   Drug use: No     Allergies   Ace inhibitors   Review of Systems Review of Systems  Musculoskeletal:  Positive for back pain (mid lower back) and neck pain (left neck, facial and shoulder pain).    Physical Exam Triage Vital Signs ED Triage Vitals  Enc Vitals Group     BP 10/17/21 1027 (!) 115/114     Pulse Rate 10/17/21 1027 69     Resp 10/17/21 1027 18     Temp 10/17/21 1027 (!) 92.4 F (33.6 C)     Temp src --      SpO2 10/17/21 1027 94 %     Weight --      Height --      Head Circumference --      Peak Flow --      Pain Score 10/17/21 1025 9     Pain Loc --      Pain Edu? --      Excl. in [place]? --    No data found.  Updated Vital Signs BP (!) 115/114   Pulse 69   Temp (!) 92.4 F (33.6 C)   Resp 18   SpO2 94%   Visual Acuity Right Eye Distance:   Left Eye  Distance:   Bilateral Distance:    Right Eye Near:   Left Eye Near:    Bilateral Near:     Physical Exam Constitutional:      Appearance: Normal appearance.  HENT:     Right Ear: Tympanic membrane and ear canal normal.     Left Ear: Tympanic membrane and ear canal normal.     Mouth/Throat:     Mouth: Mucous membranes are moist.     Pharynx: Oropharynx is clear. Uvula midline.  Eyes:     Extraocular Movements: Extraocular movements intact.     Conjunctiva/sclera: Conjunctivae normal.  Neck:     Comments: Neck: Full range of motion is intact without  crepitus Cardiovascular:     Rate and Rhythm: Normal rate and regular rhythm.     Heart sounds: Normal heart sounds.  Pulmonary:     Effort: Pulmonary effort is normal.     Breath sounds: Normal breath sounds and air entry. No wheezing, rhonchi or rales.  Musculoskeletal:     Left shoulder: Tenderness (tenderness palpated at the joint line and deltoid area, FROM intack, strength intact) present.     Cervical back: Normal.     Comments: Lumbar: Pain is palpated along the mid L5-S1 area, no swelling.  Negative straight leg raise bilaterally, strength is 5/5 bilaterally, DTRs are 2+ and symmetrical bilaterally at the knees.  Lymphadenopathy:     Cervical: No cervical adenopathy.  Neurological:     Mental Status: She is alert.     UC Treatments / Results  Labs (all labs ordered are listed, but only abnormal results are displayed) Labs Reviewed  RPR  HIV ANTIBODY (ROUTINE TESTING W REFLEX)  POCT URINALYSIS DIPSTICK, ED / UC  CERVICOVAGINAL ANCILLARY ONLY   Urinalysis: Negative  EKG   Radiology No results found.  Procedures Procedures (including critical care time)  Medications Ordered in UC Medications - No data to display  Initial Impression / Assessment and Plan / UC Course  I have reviewed the triage vital signs and the nursing notes.  Pertinent labs & imaging results that were available during my care of the patient were reviewed by me and considered in my medical decision making (see chart for details).    Plan: 1.  Patient advised to use ice to the areas of pain, 10 minutes on and 20 minutes off, 4-5 times throughout the day. 2.  Advised to take ibuprofen and Zanaflex as directed for pain and muscle spasm. 3.  STI screening is pending. 4.  Patient advised to follow-up with PCP or return to urgent care if symptoms fail to improve. Final Clinical Impressions(s) / UC Diagnoses   Final diagnoses:  Routine screening for STI (sexually transmitted infection)  Left  facial pain  Acute pain of left shoulder  Strain of left shoulder, initial encounter  Chronic midline low back pain without sciatica     Discharge Instructions      Advised to take the ibuprofen 800 mg 1 every 8 hours with food for the shoulder and back pain. Advised to take the Zanaflex 4 mg 1 every 8 hours for the next couple days for muscle spasm. Advised to use ice to the area, 10 minutes on 20 minutes off, 4-5 times throughout the day for the next couple days. Follow-up with PCP or return to urgent care if symptoms fail to improve.    ED Prescriptions     Medication Sig Dispense Auth. Provider   tiZANidine (ZANAFLEX) 4 MG tablet Take  1 tablet (4 mg total) by mouth every 8 (eight) hours as needed for muscle spasms. DO NOT DRINK ALCOHOL OR DRIVE WHILE TAKING THIS MEDICATION 30 tablet Nyoka Lint, PA-C   ibuprofen (ADVIL) 800 MG tablet Take 1 tablet (800 mg total) by mouth every 8 (eight) hours as needed for moderate pain. 30 tablet Nyoka Lint, PA-C      PDMP not reviewed this encounter.   Nyoka Lint, PA-C 10/17/21 1125

## 2021-10-17 NOTE — ED Triage Notes (Signed)
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 Pt uses her upper arms. Pt has had Sx's for 4 days.

## 2021-10-17 NOTE — Discharge Instructions (Addendum)
Advised to take the ibuprofen 800 mg 1 every 8 hours with food for the shoulder and back pain. Advised to take the Zanaflex 4 mg 1 every 8 hours for the next couple days for muscle spasm. Advised to use ice to the area, 10 minutes on 20 minutes off, 4-5 times throughout the day for the next couple days. Follow-up with PCP or return to urgent care if symptoms fail to improve.

## 2021-10-18 LAB — RPR: RPR Ser Ql: NONREACTIVE

## 2021-10-20 LAB — CERVICOVAGINAL ANCILLARY ONLY
Bacterial Vaginitis (gardnerella): POSITIVE — AB
Candida Glabrata: NEGATIVE
Candida Vaginitis: NEGATIVE
Chlamydia: NEGATIVE
Comment: NEGATIVE
Comment: NEGATIVE
Comment: NEGATIVE
Comment: NEGATIVE
Comment: NEGATIVE
Comment: NORMAL
Neisseria Gonorrhea: NEGATIVE
Trichomonas: NEGATIVE

## 2021-10-21 ENCOUNTER — Telehealth (HOSPITAL_COMMUNITY): Payer: Self-pay | Admitting: Emergency Medicine

## 2021-10-21 MED ORDER — METRONIDAZOLE 500 MG PO TABS: 500.0000 mg | ORAL_TABLET | Freq: Two times a day (BID) | ORAL | 0 refills | Status: DC

## 2021-10-23 DIAGNOSIS — L659 Nonscarring hair loss, unspecified: Secondary | ICD-10-CM | POA: Diagnosis not present

## 2021-10-30 ENCOUNTER — Encounter (HOSPITAL_COMMUNITY): Payer: Self-pay

## 2021-10-30 ENCOUNTER — Ambulatory Visit (HOSPITAL_COMMUNITY)
Admission: EM | Admit: 2021-10-30 | Discharge: 2021-10-30 | Disposition: A | Discharge status: Home / Self Care | Payer: Medicaid Other | Attending: Internal Medicine | Admitting: Internal Medicine

## 2021-10-30 DIAGNOSIS — H65111 Acute and subacute allergic otitis media (mucoid) (sanguinous) (serous), right ear: Secondary | ICD-10-CM | POA: Diagnosis not present

## 2021-10-30 DIAGNOSIS — M5410 Radiculopathy, site unspecified: Secondary | ICD-10-CM | POA: Diagnosis not present

## 2021-10-30 MED ORDER — KETOROLAC TROMETHAMINE 30 MG/ML IJ SOLN
30.0000 mg | Freq: Once | INTRAMUSCULAR | Status: AC
  Administered 2021-10-30: 30 mg via INTRAMUSCULAR

## 2021-10-30 MED ORDER — AMOXICILLIN-POT CLAVULANATE 875-125 MG PO TABS
1.0000 | ORAL_TABLET | Freq: Two times a day (BID) | ORAL | 0 refills | Status: DC
Start: 2021-10-30 — End: 2022-02-25

## 2021-10-30 MED ORDER — IBUPROFEN 600 MG PO TABS: 600.0000 mg | ORAL_TABLET | Freq: Four times a day (QID) | ORAL | 0 refills | Status: DC | PRN

## 2021-10-30 MED ORDER — KETOROLAC TROMETHAMINE 30 MG/ML IJ SOLN
INTRAMUSCULAR | Status: AC
  Filled 2021-10-30: qty 1

## 2021-10-30 NOTE — ED Triage Notes (Signed)
Patient states back pain onset of yesterday. Woke up this morning with increased back pain going down into the legs.  Has history of back injury 5 years ago due to falling down stairs.   Irritation in both ears for a week. Itching and swelling in the ears.

## 2021-10-30 NOTE — Discharge Instructions (Addendum)
Please take medications as prescribed Heating pad use only 20 minutes on-20 minutes off cycle Gentle stretching exercises If symptoms worsen please return to urgent care to be reevaluated.

## 2021-11-01 NOTE — ED Provider Notes (Signed)
[place]    CSN: [id number] Arrival date & time: 10/30/21  5726      History   Chief Complaint Chief Complaint  Patient presents with   Back Pain   Otalgia    HPI Chelsea Branch is a 56 y.o. female comes to the urgent care with 1 day history of lower back pain.  Pain is sharp, severe, aggravated by movement and denies any known relieving factors.  Pain started insidiously yesterday and has worsened over the past 24 hours.  It appears to be radiating down to the legs.  No urinary or bladder problems.  Patient denies any trauma to the lower back.  No heavy lifting.  Patient works in [employer] and the job requires repetitive rotational movements of her lower back.  No nausea or vomiting.  No dysuria urgency or frequency.  No nausea or vomiting.  Patient is complaining of bilateral ear itching and sensation of swelling..  Symptoms have been ongoing for the past month.  He says that over the past couple of days the right ear has become painful.  She denies any seasonal allergies, nausea or vomiting.  No fever or chills.  No ringing in the ears.  No loss of hearing.  No jaw or pain in the teeth. HPI  Past Medical History:  Diagnosis Date   Abnormal thyroid blood test 03/15/2017   Abnormal uterine bleeding    Alopecia    Anemia    Angina pectoris with normal coronary arteriogram ([place]) 2017   Had + Troponin c/w ? 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 iniatially ?2/2 peripartum in 1994 - improved by 2008 then worsening EF in 2011 back down to EF 25-30%. b. echo 01/21/14 showed mod LVH, [name]; 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   Peripartum cardiomyopathy 1994   Sleep apnea 2015   CPAP 12/2013   Stroke ([place]) 2035   Systolic and diastolic CHF, acute on chronic ([place]) 06/14/2015   Tobacco abuse     Patient Active Problem List   Diagnosis Date Noted   Spinal stenosis of lumbar region 07/21/2021   Lumbar radiculitis 07/21/2021   COPD exacerbation ([place]) 07/21/2021   Mechanical complication due to intrauterine contraceptive device 07/21/2021   Arthropathy of lumbar facet joint 03/14/2019   Degenerative disc disease at L5-S1 level 10/10/2018   Abnormal thyroid blood test 03/15/2017   Hypokalemia 07/23/2016   Low TSH level 04/26/2016   Abnormal uterine bleeding 04/06/2016   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   History of non-ST elevation myocardial infarction (NSTEMI) 06/14/2015   Low back pain 09/16/2014   Homelessness 09/16/2014   Vitamin D deficiency 01/29/2014   OSA (obstructive sleep apnea) 01/08/2014   Chronic combined systolic and diastolic CHF (congestive heart failure) ([place]) 08/14/2013   Essential hypertension    Fibroids 08/09/2012   Menorrhagia 05/08/2012   Female pattern hair loss    HLD (hyperlipidemia) 06/08/2010   CEREBRAL ANEURYSM 06/08/2010  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   CEREBROVASCULAR ACCIDENT, HX OF 06/08/2010    Past Surgical History:  Procedure Laterality Date   CARDIAC CATHETERIZATION N/A 06/16/2015   Procedure: Left Heart Cath and Coronary Angiography;  Surgeon: Jettie Booze, MD;  Location: [facility];  Service: Cardiovascular;  Laterality: N/A;   CESAREAN SECTION  1992  1994   LOOP RECORDER IMPLANT  ~ [place]     TUBAL LIGATION  1994    OB History     Gravida  2   Para  2   Term  2   Preterm      AB      Living  3      SAB      IAB      Ectopic       Multiple  1   Live Births  3            Home Medications    Prior to Admission medications   Medication Sig Start Date End Date Taking? Authorizing Provider  amoxicillin-clavulanate (AUGMENTIN) 875-125 MG tablet Take 1 tablet by mouth every 12 (twelve) hours. 10/30/21  Yes Davone Shinault, Myrene Galas, MD  ibuprofen (ADVIL) 600 MG tablet Take 1 tablet (600 mg total) by mouth every 6 (six) hours as needed. 10/30/21  Yes Mckaela Howley, Myrene Galas, MD  magnesium oxide (MAG-OX) 400 MG tablet TAKE 1 CAPSULE (400 MG TOTAL) BY MOUTH 2 (TWO) TIMES DAILY. 07/21/21  Yes Elsie Stain, MD  metoprolol succinate (TOPROL-XL) 50 MG 24 hr tablet Take 1 tablet (50 mg total) by mouth daily. Please keep upcoming appt with Cardiologist in March 2023 before anymore refills. Thank you Final attempt 09/23/21  Yes Elsie Stain, MD  potassium chloride SA (KLOR-CON M20) 20 MEQ tablet TAKE 1-2 TABS BY MOUTH AS DIRECTED. TAKE 2 TABLETS IN THE MORNING AND 1 TABLET IN THE EVENING 09/23/21  Yes Elsie Stain, MD  sacubitril-valsartan (ENTRESTO) 24-26 MG Take 1 tablet by mouth 2 (two) times daily. >>SEE DIRECTIONS AS YOUR DOSE HAS CHANGED<< 09/23/21  Yes Elsie Stain, MD  spironolactone (ALDACTONE) 25 MG tablet Take 0.5 tablets (12.5 mg total) by mouth daily. 09/23/21  Yes Elsie Stain, MD  traMADol Veatrice Bourbon) 50 MG tablet take one tablet po q 8-12 hours as needed for severe pain only 10/07/21  Yes [provider]  acetaminophen (TYLENOL) 500 MG tablet Take 1 tablet (500 mg total) by mouth every 6 (six) hours as needed. 05/19/21   Hazel Sams, PA-C  albuterol (VENTOLIN HFA) 108 (90 Base) MCG/ACT inhaler Inhale 1-2 puffs into the lungs every 6 (six) hours as needed for wheezing or shortness of breath. 07/21/21   Elsie Stain, MD  Blood Pressure Monitoring (BLOOD PRESSURE KIT) DEVI Use to measure blood pressure twice daily 07/22/21   Elsie Stain, MD  diclofenac Sodium (VOLTAREN) 1 % GEL Apply 4 g topically 4 (four)  times daily. 09/23/21   Elsie Stain, MD  fluticasone (FLONASE) 50 MCG/ACT nasal spray Place 1 spray into both nostrils in the morning and at bedtime. 07/21/21   Elsie Stain, MD  lidocaine (LIDODERM) 5 % Place 1 patch onto the skin daily. Remove & Discard patch within 12 hours or as directed by MD 09/23/21   Elsie Stain, MD  Misc. Devices MISC Please provide patient with insurance approved straight cane 09/29/18   Gildardo Pounds, NP  Misc. Devices MISC Single-point cane with adjustable  height x1, shower chair x1, transfer bench x1.  Diagnosis congestive heart failure, degenerative disc disease of the lumbar spine. 11/23/18   Charlott Rakes, MD  simvastatin (ZOCOR) 10 MG tablet TAKE 1 TABLET BY MOUTH EVERY DAY IN THE EVENING. Please make overdue appt with Dr. Caryl Comes before anymore refills. Thank you 1st attempt 09/23/21   Elsie Stain, MD  tiZANidine (ZANAFLEX) 4 MG tablet Take 1 tablet (4 mg total) by mouth every 8 (eight) hours as needed for muscle spasms. DO NOT DRINK ALCOHOL OR DRIVE WHILE TAKING THIS MEDICATION 10/17/21   Nyoka Lint, PA-C  torsemide (DEMADEX) 20 MG tablet Take 1 tablet (20 mg total) by mouth daily. 09/23/21   Elsie Stain, MD  DULoxetine (CYMBALTA) 30 MG capsule Take 1 capsule (30 mg total) by mouth daily. 12/07/18 12/10/19  Charlott Rakes, MD  famotidine (PEPCID) 20 MG tablet Take 1 tablet (20 mg total) 2 (two) times daily by mouth. 04/10/17 11/03/19  Albesa Seen, PA-C    Family History Family History  Problem Relation Age of Onset   Cancer Maternal Grandmother        uterine   Hypertension Sister    Healthy Mother    Other Neg Hx    Heart disease Neg Hx     Social History Social History   Tobacco Use   Smoking status: Former    Packs/day: 0.10    Years: 30.00    Total pack years: 3.00    Types: Cigarettes    Quit date: 06/03/2015    Years since quitting: 6.4   Smokeless tobacco: Never   Tobacco comments:    Pt. stated she stopped smoking a  year ago. 09/29/2018  Vaping Use   Vaping Use: Never used  Substance Use Topics   Alcohol use: No    Alcohol/week: 0.0 standard drinks of alcohol   Drug use: No     Allergies   Ace inhibitors   Review of Systems Review of Systems  HENT:  Positive for ear pain. Negative for congestion, dental problem, ear discharge, facial swelling, hearing loss, sinus pressure, sinus pain and sore throat.   Genitourinary: Negative.   Musculoskeletal:  Positive for back pain. Negative for myalgias, neck pain and neck stiffness.  Skin: Negative.   Neurological: Negative.      Physical Exam Triage Vital Signs ED Triage Vitals  Enc Vitals Group     BP 10/30/21 0913 135/88     Pulse Rate 10/30/21 0913 69     Resp 10/30/21 0913 16     Temp 10/30/21 0913 97.8 F (36.6 C)     Temp Source 10/30/21 0913 Oral     SpO2 10/30/21 0913 98 %     Weight 10/30/21 0916 210 lb (95.3 kg)     Height 10/30/21 0916 5' 8"  (1.727 m)     Head Circumference --      Peak Flow --      Pain Score 10/30/21 0945 9     Pain Loc --      Pain Edu? --      Excl. in [place]? --    No data found.  Updated Vital Signs BP 135/88 (BP Location: Right Arm)   Pulse 69   Temp 97.8 F (36.6 C) (Oral)   Resp 16   Ht 5' 8"  (1.727 m)   Wt 95.3 kg   LMP  (LMP Unknown)   SpO2 98%   BMI 31.93 kg/m   Visual Acuity Right Eye  Distance:   Left Eye Distance:   Bilateral Distance:    Right Eye Near:   Left Eye Near:    Bilateral Near:     Physical Exam Vitals and nursing note reviewed.  Constitutional:      General: She is not in acute distress.    Appearance: She is not ill-appearing.  HENT:     Left Ear: Tympanic membrane normal.     Ears:     Comments: Erythema of the right tympanic membrane.  Middle ear effusions bilaterally.  Left tympanic membrane is without erythema Cardiovascular:     Rate and Rhythm: Normal rate and regular rhythm.  Musculoskeletal:        General: Normal range of motion.     Comments:  Tenderness over the sacroiliac joints bilaterally.  Tenderness over the paraspinal muscle in the lumbosacral area.  Straight leg raise test is negative.  Deep tendon reflexes are 2+ in both knees  Neurological:     Mental Status: She is alert.      UC Treatments / Results  Labs (all labs ordered are listed, but only abnormal results are displayed) Labs Reviewed - No data to display  EKG   Radiology No results found.  Procedures Procedures (including critical care time)  Medications Ordered in UC Medications  ketorolac (TORADOL) 30 MG/ML injection 30 mg (30 mg Intramuscular Given 10/30/21 0942)    Initial Impression / Assessment and Plan / UC Course  I have reviewed the triage vital signs and the nursing notes.  Pertinent labs & imaging results that were available during my care of the patient were reviewed by me and considered in my medical decision making (see chart for details).     1.  Acute back pain with radiculopathy: Toradol 30 mg IM x1 dose Ibuprofen 600 mg every 6-8 hours as needed for pain Gentle stretching exercises Heating pad use on a 20-minute on-20 minutes off cycle Return precautions given.  2.  Otitis media of the right ear: Augmentin 875-1 g twice daily for 7 days Pain management as above Return precautions given. Final Clinical Impressions(s) / UC Diagnoses   Final diagnoses:  Acute back pain with radiculopathy  Acute mucoid otitis media of right ear     Discharge Instructions      Please take medications as prescribed Heating pad use only 20 minutes on-20 minutes off cycle Gentle stretching exercises If symptoms worsen please return to urgent care to be reevaluated.   ED Prescriptions     Medication Sig Dispense Auth. Provider   ibuprofen (ADVIL) 600 MG tablet Take 1 tablet (600 mg total) by mouth every 6 (six) hours as needed. 30 tablet Dontravious Camille, Myrene Galas, MD   amoxicillin-clavulanate (AUGMENTIN) 875-125 MG tablet Take 1 tablet by  mouth every 12 (twelve) hours. 14 tablet [place], Myrene Galas, MD      PDMP not reviewed this encounter.   Chase Picket, MD 11/01/21 1112

## 2021-12-18 DIAGNOSIS — L659 Nonscarring hair loss, unspecified: Secondary | ICD-10-CM | POA: Diagnosis not present

## 2021-12-25 NOTE — Progress Notes (Deleted)
Cardiology Office Note Date:  12/25/2021  Patient ID:  Chelsea, Branch 1966-04-16, MRN [id number] PCP:  Elsie Stain, MD  Cardiologist:  Dr. Caryl Comes    Chief Complaint:  planned f/u  History of Present Illness: Chelsea Branch is a 56 y.o. 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   She comes in today to be seen for Dr.Klein, last seen by him Dec 2021, was having some dyspnea, no edema or PND, weight up 30-40lbs over a few years Orthostatic lightheadedness Echo with normalization of her EF 2020 SOB felt 2/2 weight gain, encouraged exercise and weight loss Metoprolol and entresto reduced with lightheadedness Recommended echo in 2022  [place] visits 01/05/21, leg swelling, back pain, b/l rash on elbows, d/c from the ER recommended to elevate legs and continue diuretic, f/u with cardiology 02/23/21: flank pain 05/19/22 ear drainage 05/31/21 SOB, cough, body aches + Flu 05/31/21: returned with same symptoms, continue supportive care 06/20/21 vaginal discharge 07/01/21 cough, eye drainage, nasal congestion 07/30/21 shoulder pain   I saw her 08/14/21 For the most part doing OK, works on a [employer] and can be long hours, generally fatigued Occassionally or a few nights a week for the last 4 mo or so she has felt some Branch time palpitations/flutters, and some SOB in bed, better with an extra pillow. No CP, no daytime or exertional SOB No near syncope or syncope. Planned for a monitor and updated echo, labs  Labs were OK LVEF 60-65%, no WMA, grade I DD, RV Ok Monitor pending  More ER/[facility] visits 5.27.23: eval for STD, shoulder injury and facial swelling 11/19/21: low back pain and b/l ear itching  *** volume ? *** symptoms *** MD next *** monitor??   Past Medical History:  Diagnosis Date   Abnormal thyroid blood test 03/15/2017   Abnormal uterine bleeding    Alopecia    Anemia     Angina pectoris with normal coronary arteriogram ([place]) 2017   Had + Troponin c/w ? 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 iniatially ?2/2 peripartum in 1994 - improved by 2008 then worsening EF in 2011 back down to EF 25-30%. b. echo 01/21/14 showed mod LVH, [name]; 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   Peripartum cardiomyopathy 1994   Sleep apnea 2015   CPAP 12/2013   Stroke ([place]) 4656   Systolic and diastolic CHF, acute on chronic ([place]) 06/14/2015   Tobacco abuse     Past Surgical History:  Procedure Laterality Date   CARDIAC CATHETERIZATION N/A 06/16/2015   Procedure: Left Heart Cath and Coronary Angiography;  Surgeon: Jettie Booze, MD;  Location: [facility];  Service: Cardiovascular;  Laterality: N/A;   [place] IMPLANT  ~ [place]    Current  Outpatient Medications  Medication Sig Dispense Refill   acetaminophen (TYLENOL) 500 MG tablet Take 1 tablet (500 mg total) by mouth every 6 (six) hours as needed. 30 tablet 0   albuterol (VENTOLIN HFA) 108 (90 Base) MCG/ACT inhaler Inhale 1-2 puffs into the lungs every 6 (six) hours as needed for wheezing or shortness of breath. 18 g 2   amoxicillin-clavulanate (AUGMENTIN) 875-125 MG tablet Take 1 tablet by mouth every 12 (twelve) hours. 14 tablet 0   Blood Pressure Monitoring (BLOOD PRESSURE KIT) DEVI Use to measure blood pressure twice daily 1 each 0    diclofenac Sodium (VOLTAREN) 1 % GEL Apply 4 g topically 4 (four) times daily. 100 g 0   fluticasone (FLONASE) 50 MCG/ACT nasal spray Place 1 spray into both nostrils in the morning and at bedtime. 16 g 2   ibuprofen (ADVIL) 600 MG tablet Take 1 tablet (600 mg total) by mouth every 6 (six) hours as needed. 30 tablet 0   lidocaine (LIDODERM) 5 % Place 1 patch onto the skin daily. Remove & Discard patch within 12 hours or as directed by MD 30 patch 0   magnesium oxide (MAG-OX) 400 MG tablet TAKE 1 CAPSULE (400 MG TOTAL) BY MOUTH 2 (TWO) TIMES DAILY. 180 tablet 2   metoprolol succinate (TOPROL-XL) 50 MG 24 hr tablet Take 1 tablet (50 mg total) by mouth daily. Please keep upcoming appt with Cardiologist in March 2023 before anymore refills. Thank you Final attempt 90 tablet 2   Misc. Devices MISC Please provide patient with insurance approved straight cane 1 each 0   Misc. Devices MISC Single-point cane with adjustable height x1, shower chair x1, transfer bench x1.  Diagnosis congestive heart failure, degenerative disc disease of the lumbar spine. 1 each 0   potassium chloride SA (KLOR-CON M20) 20 MEQ tablet TAKE 1-2 TABS BY MOUTH AS DIRECTED. TAKE 2 TABLETS IN THE MORNING AND 1 TABLET IN THE EVENING 90 tablet 2   sacubitril-valsartan (ENTRESTO) 24-26 MG Take 1 tablet by mouth 2 (two) times daily. >>SEE DIRECTIONS AS YOUR DOSE HAS CHANGED<< 180 tablet 0   simvastatin (ZOCOR) 10 MG tablet TAKE 1 TABLET BY MOUTH EVERY DAY IN THE EVENING. Please make overdue appt with Dr. Caryl Comes before anymore refills. Thank you 1st attempt 60 tablet 4   spironolactone (ALDACTONE) 25 MG tablet Take 0.5 tablets (12.5 mg total) by mouth daily. 60 tablet 2   tiZANidine (ZANAFLEX) 4 MG tablet Take 1 tablet (4 mg total) by mouth every 8 (eight) hours as needed for muscle spasms. DO NOT DRINK ALCOHOL OR DRIVE WHILE TAKING THIS MEDICATION 30 tablet 0   torsemide (DEMADEX) 20 MG tablet Take 1 tablet (20 mg total) by mouth daily. 60  tablet 2   traMADol (ULTRAM) 50 MG tablet take one tablet po q 8-12 hours as needed for severe pain only     No current facility-administered medications for this visit.    Allergies:   Ace inhibitors   Social History:  The patient  reports that she quit smoking about 6 years ago. Her smoking use included cigarettes. She has a 3.00 pack-year smoking history. She has never used smokeless tobacco. She reports that she does not drink alcohol and does not use drugs.   Family History:  The patient's family history includes Cancer in her maternal grandmother; Healthy in her mother; Hypertension in her sister.  ROS:  Please see the history of present illness.  All other systems are reviewed and otherwise negative.  PHYSICAL EXAM:  VS:  There were no vitals taken for this visit. BMI: There is no height or weight on file to calculate BMI. Well nourished, well developed, in no acute distress  HEENT: normocephalic, atraumatic  Neck: no JVD, carotid bruits or masses Cardiac:  *** RRR; no significant murmurs, no rubs, or gallops Lungs: *** CTA b/l, no wheezing, rhonchi or rales  Abd: soft, non-tender, obese MS: no deformity or atrophy Ext: *** no edema is appreciated Skin: warm and dry, no rash Neuro:  No gross deficits appreciated Psych: euthymic mood, full affect   EKG:  not done today  08/25/21: TTE  1. Left ventricular ejection fraction, by estimation, is 60 to 65%. Left  ventricular ejection fraction by 3D volume is 65 %. The left ventricle has  normal function. The left ventricle has no regional wall motion  abnormalities. There is mild concentric  left ventricular hypertrophy. Left ventricular diastolic parameters are  consistent with Grade I diastolic dysfunction (impaired relaxation).   2. Right ventricular systolic function is normal. The right ventricular  size is normal. There is normal pulmonary artery systolic pressure. The  estimated right ventricular systolic pressure is  45.8 mmHg.   3. The mitral valve is normal in structure. Trivial mitral valve  regurgitation.   4. The aortic valve is tricuspid. Aortic valve regurgitation is not  visualized. No aortic stenosis is present.   5. The inferior vena cava is normal in size with greater than 50%  respiratory variability, suggesting right atrial pressure of 3 mmHg.   Comparison(s): Compared to prior TTE in 06/2018, there is no significant  change.   07/07/2018: TTE  1. The left ventricle has normal systolic function with an ejection  fraction of 60-65%. The cavity size was normal. There is moderately  increased left ventricular wall thickness. Left ventricular diastolic  Doppler parameters are consistent with  pseudonormalization No evidence of left ventricular regional wall motion  abnormalities.   2. The right ventricle has normal systolic function. The cavity was  normal. There is no increase in right ventricular wall thickness.   3. The mitral valve is normal in structure. No evidence of mitral valve  stenosis. No regurgitation.   4. The tricuspid valve is normal in structure.   5. The aortic valve is tricuspid no stenosis of the aortic valve.   6. The pulmonic valve was normal in structure.   7. The aortic root and ascending aorta are normal in size and structure.   8. Right atrial pressure is estimated at 3 mmHg.   9. No complete TR doppler jet so unable to estimate PA systolic pressure.   03/29/17: TTE Study Conclusions - Left ventricle: The cavity size was normal. There was mild   concentric hypertrophy. Systolic function was mildly to   moderately reduced. The estimated ejection fraction was in the   range of 40% to 45%. Diffuse hypokinesis. Doppler parameters are   consistent with abnormal left ventricular relaxation (grade 1   diastolic dysfunction). - Left atrium: The atrium was mildly dilated.  06/15/16: TTE: LVEF 30-35% 06/15/15: TTE: LVEF 25-30% 01/21/14: TTE: LVEF 50-55% 07/31/13: TTE:  LVEF 25-30%  06/16/15:L LHC There is severe left ventricular systolic dysfunction. No significant CAD. LVEDP elevated.   Recent Labs: 07/21/2021: ALT 16; Hemoglobin 15.7; Magnesium 2.2; Platelets 281 09/23/2021: BUN 11; Creatinine, Ser 0.86; Potassium 4.3; Sodium 142  07/21/2021: Chol/HDL Ratio 3.6; Cholesterol, Total 150; HDL 42; LDL Chol Calc (NIH) 85; Triglycerides 131   CrCl cannot  be calculated (Patient's most recent lab result is older than the maximum 21 days allowed.).   Wt Readings from Last 3 Encounters:  10/30/21 210 lb (95.3 kg)  09/23/21 207 lb (93.9 kg)  08/14/21 206 lb 12.8 oz (93.8 kg)     Other studies reviewed: Additional studies/records reviewed today include: summarized above  ASSESSMENT AND PLAN:  1. NICM 2. Chronic CHF > ?HFpEF     No exam findings of volume OL     on BB, Entresto, lasix/K+, aldactone  ***  3. HTN     *** Looks OK, no changes  4. HLD     Not addressed today       5. OSA? She reports being tested but not required CPAP   Disposition:  ***   Current medicines are reviewed at length with the patient today.  The patient did not have any concerns regarding medicines.  Chelsea Night, PA-C 12/25/2021 11:43 AM     [facility] [place] [address] [phone] (office)  [phone] (fax)

## 2021-12-28 ENCOUNTER — Ambulatory Visit (HOSPITAL_COMMUNITY)
Admission: EM | Admit: 2021-12-28 | Discharge: 2021-12-28 | Disposition: A | Discharge status: Home / Self Care | Payer: Medicaid Other | Attending: Physician Assistant | Admitting: Physician Assistant

## 2021-12-28 ENCOUNTER — Ambulatory Visit: Payer: Medicaid Other | Admitting: Critical Care Medicine

## 2021-12-28 ENCOUNTER — Encounter (HOSPITAL_COMMUNITY): Payer: Self-pay | Admitting: *Deleted

## 2021-12-28 ENCOUNTER — Ambulatory Visit: Payer: Medicaid Other | Admitting: Physician Assistant

## 2021-12-28 DIAGNOSIS — H9203 Otalgia, bilateral: Secondary | ICD-10-CM | POA: Diagnosis not present

## 2021-12-28 DIAGNOSIS — R8290 Unspecified abnormal findings in urine: Secondary | ICD-10-CM | POA: Diagnosis not present

## 2021-12-28 DIAGNOSIS — M170 Bilateral primary osteoarthritis of knee: Secondary | ICD-10-CM | POA: Insufficient documentation

## 2021-12-28 DIAGNOSIS — G894 Chronic pain syndrome: Secondary | ICD-10-CM | POA: Insufficient documentation

## 2021-12-28 LAB — POCT URINALYSIS DIPSTICK, ED / UC
Bilirubin Urine: NEGATIVE
Glucose, UA: NEGATIVE mg/dL
Hgb urine dipstick: NEGATIVE
Ketones, ur: NEGATIVE mg/dL
Leukocytes,Ua: NEGATIVE
Nitrite: NEGATIVE
Protein, ur: NEGATIVE mg/dL
Specific Gravity, Urine: >=1.03 (ref 1.005–1.030)
Urobilinogen, UA: 0.2 mg/dL (ref 0.0–1.0)
pH: 5 (ref 5.0–8.0)

## 2021-12-28 MED ORDER — ACETIC ACID 2 % OT SOLN: 4.0000 [drp] | Freq: Three times a day (TID) | OTIC | 0 refills | Status: DC

## 2021-12-28 NOTE — ED Triage Notes (Signed)
Patient complains of ear pain X1 week. Patient asking for ear drops not oral antibiotics. Also having vaginal pain and odor x 1 week.

## 2021-12-28 NOTE — ED Provider Notes (Signed)
Hiram    CSN: [id number] Arrival date & time: 12/28/21  7672      History   Chief Complaint Chief Complaint  Patient presents with   Ear Drainage    HPI Chelsea Branch is a 56 y.o. female.   Patient here for evaluation of "ear drainage" x 2 weeks.  She was seen elsewhere and given amoxicillin, which she said didn't help.  She notes b/l pain and discharge.  Denies f/c, URI sx, cough ,wheezing, SOB.    Also c/w suprapubic abdominal pain.  Denies frequency, urgency, hematuria, dysuria, though she notes a change in odor.      Past Medical History:  Diagnosis Date   Abnormal thyroid blood test 03/15/2017   Abnormal uterine bleeding    Alopecia    Anemia    Angina pectoris with normal coronary arteriogram ([place]) 2017   Had + Troponin c/w ? 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 iniatially ?2/2 peripartum in 1994 - improved by 2008 then worsening EF in 2011 back down to EF 25-30%. b. echo 01/21/14 showed mod LVH, [name]; 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   Peripartum cardiomyopathy 1994   Sleep apnea 2015   CPAP 12/2013   Stroke ([place]) 0947   Systolic and diastolic CHF, acute on chronic ([place]) 06/14/2015   Tobacco abuse     Patient Active Problem List   Diagnosis Date Noted   Chronic pain syndrome 12/28/2021   Primary osteoarthritis of both knees 12/28/2021   Spinal stenosis of lumbar region 07/21/2021   Lumbar radiculitis 07/21/2021   COPD exacerbation ([place]) 07/21/2021    Mechanical complication due to intrauterine contraceptive device 07/21/2021   Arthropathy of lumbar facet joint 03/14/2019   Degenerative disc disease at L5-S1 level 10/10/2018   Abnormal thyroid blood test 03/15/2017   Hypokalemia 07/23/2016   Low TSH level 04/26/2016   Abnormal uterine bleeding 04/06/2016   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   History of non-ST elevation myocardial infarction (NSTEMI) 06/14/2015   Low back pain 09/16/2014   Homelessness 09/16/2014   Vitamin D deficiency 01/29/2014   OSA (obstructive sleep apnea) 01/08/2014   Chronic combined systolic and diastolic CHF (congestive heart failure) ([place]) 08/14/2013   Essential hypertension    Fibroids 08/09/2012   Menorrhagia 05/08/2012   Female pattern hair loss    HLD (hyperlipidemia) 06/08/2010   CEREBRAL ANEURYSM 06/08/2010   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   CEREBROVASCULAR ACCIDENT, HX OF 06/08/2010    Past Surgical History:  Procedure Laterality Date   CARDIAC CATHETERIZATION N/A 06/16/2015   Procedure: Left Heart Cath and Coronary Angiography;  Surgeon: Jettie Booze, MD;  Location: [facility];  Service: Cardiovascular;  Laterality: N/A;   CESAREAN SECTION  1992  1994   LOOP RECORDER IMPLANT  ~ [place]    OB History     Gravida  2   Para  2   Term  2   Preterm      AB      Living  3      SAB      IAB      Ectopic      Multiple  1   Live Births  3            Home Medications    Prior to Admission medications   Medication Sig Start Date End Date Taking? Authorizing Provider  acetaminophen (TYLENOL) 500 MG tablet Take 1 tablet (500 mg total) by mouth every 6 (six) hours as needed. 05/19/21  Yes Hazel Sams, PA-C  acetic acid 2 % otic solution Place 4 drops into both ears 3 (three) times daily. 12/28/21  Yes Peri Jefferson, PA-C  albuterol (VENTOLIN HFA) 108 (90 Base) MCG/ACT inhaler  Inhale 1-2 puffs into the lungs every 6 (six) hours as needed for wheezing or shortness of breath. 07/21/21  Yes Elsie Stain, MD  Blood Pressure Monitoring (BLOOD PRESSURE KIT) DEVI Use to measure blood pressure twice daily 07/22/21  Yes Elsie Stain, MD  diclofenac Sodium (VOLTAREN) 1 % GEL Apply 4 g topically 4 (four) times daily. 09/23/21  Yes Elsie Stain, MD  fluticasone (FLONASE) 50 MCG/ACT nasal spray Place 1 spray into both nostrils in the morning and at bedtime. 07/21/21  Yes Elsie Stain, MD  gabapentin (NEURONTIN) 300 MG capsule Take by mouth. 10/07/21  Yes [provider]  ibuprofen (ADVIL) 600 MG tablet Take 1 tablet (600 mg total) by mouth every 6 (six) hours as needed. 10/30/21  Yes Lamptey, Myrene Galas, MD  lidocaine (LIDODERM) 5 % Place 1 patch onto the skin daily. Remove & Discard patch within 12 hours or as directed by MD 09/23/21  Yes Elsie Stain, MD  magnesium oxide (MAG-OX) 400 MG tablet TAKE 1 CAPSULE (400 MG TOTAL) BY MOUTH 2 (TWO) TIMES DAILY. 07/21/21  Yes Elsie Stain, MD  metoprolol succinate (TOPROL-XL) 50 MG 24 hr tablet Take 1 tablet (50 mg total) by mouth daily. Please keep upcoming appt with Cardiologist in March 2023 before anymore refills. Thank you Final attempt 09/23/21  Yes Elsie Stain, MD  Misc. Devices MISC Please provide patient with insurance approved straight cane 09/29/18  Yes Gildardo Pounds, NP  Misc. Devices MISC Single-point cane with adjustable height x1, shower chair x1, transfer bench x1.  Diagnosis congestive heart failure, degenerative disc disease of the lumbar spine. 11/23/18  Yes Newlin, Charlane Ferretti, MD  potassium chloride SA (KLOR-CON M20) 20 MEQ tablet TAKE 1-2 TABS BY MOUTH AS DIRECTED. TAKE 2 TABLETS IN THE MORNING AND 1 TABLET IN THE EVENING 09/23/21  Yes Elsie Stain, MD  sacubitril-valsartan (ENTRESTO) 24-26 MG Take 1 tablet by mouth 2 (two) times daily. >>SEE DIRECTIONS AS YOUR DOSE HAS CHANGED<< 09/23/21  Yes Elsie Stain, MD  simvastatin (ZOCOR) 10 MG tablet TAKE 1 TABLET BY MOUTH EVERY DAY IN THE EVENING. Please make overdue appt with Dr. Caryl Comes before anymore refills. Thank you 1st attempt 09/23/21  Yes Elsie Stain, MD  spironolactone (ALDACTONE) 25 MG tablet Take 0.5 tablets (12.5 mg total) by mouth daily. 09/23/21  Yes Elsie Stain, MD  tiZANidine (ZANAFLEX) 4 MG tablet Take 1 tablet (4 mg total) by mouth every 8 (eight) hours as needed for muscle spasms. DO NOT DRINK ALCOHOL OR DRIVE WHILE TAKING THIS MEDICATION 10/17/21  Yes Nyoka Lint, PA-C  torsemide [facility]) 20  MG tablet Take 1 tablet (20 mg total) by mouth daily. 09/23/21  Yes Elsie Stain, MD  traMADol [facility]) 50 MG tablet take one tablet po q 8-12 hours as needed for severe pain only 10/07/21  Yes [provider]  amoxicillin-clavulanate (AUGMENTIN) 875-125 MG tablet Take 1 tablet by mouth every 12 (twelve) hours. 10/30/21   Lamptey, Myrene Galas, MD  DULoxetine (CYMBALTA) 30 MG capsule Take 1 capsule (30 mg total) by mouth daily. 12/07/18 12/10/19  Charlott Rakes, MD  famotidine (PEPCID) 20 MG tablet Take 1 tablet (20 mg total) 2 (two) times daily by mouth. 04/10/17 11/03/19  Albesa Seen, PA-C    Family History Family History  Problem Relation Age of Onset   Cancer Maternal Grandmother        uterine   Hypertension Sister    Healthy Mother    Other Neg Hx    Heart disease Neg Hx     Social History Social History   Tobacco Use   Smoking status: Former    Packs/day: 0.10    Years: 30.00    Total pack years: 3.00    Types: Cigarettes    Quit date: 06/03/2015    Years since quitting: 6.5   Smokeless tobacco: Never   Tobacco comments:    Pt. stated she stopped smoking a year ago. 09/29/2018  Vaping Use   Vaping Use: Never used  Substance Use Topics   Alcohol use: No    Alcohol/week: 0.0 standard drinks of alcohol   Drug use: No     Allergies   Ace inhibitors   Review of Systems Review of Systems   Constitutional:  Negative for chills, fatigue and fever.  HENT:  Positive for ear discharge and ear pain. Negative for congestion, facial swelling, hearing loss, postnasal drip, rhinorrhea, tinnitus and trouble swallowing.   Gastrointestinal:  Positive for abdominal pain. Negative for diarrhea, nausea and vomiting.  Genitourinary:  Negative for decreased urine volume, difficulty urinating, dyspareunia, dysuria, flank pain, frequency, hematuria, menstrual problem, pelvic pain, urgency, vaginal bleeding, vaginal discharge and vaginal pain.  Musculoskeletal:  Negative for arthralgias, back pain and myalgias.  Skin:  Negative for color change and rash.  Neurological:  Negative for seizures and syncope.  Psychiatric/Behavioral:  Negative for sleep disturbance. The patient is not nervous/anxious.   All other systems reviewed and are negative.    Physical Exam Triage Vital Signs ED Triage Vitals  Enc Vitals Group     BP 12/28/21 1041 (!) 159/98     Pulse Rate 12/28/21 1041 74     Resp 12/28/21 1041 18     Temp 12/28/21 1041 98.3 F (36.8 C)     Temp Source 12/28/21 1041 Oral     SpO2 12/28/21 1041 95 %     Weight --      Height --      Head Circumference --      Peak Flow --      Pain Score 12/28/21 1039 9     Pain Loc --      Pain Edu? --      Excl. in [place]? --    No data found.  Updated Vital Signs BP (!) 159/98 (BP Location: Left Arm)   Pulse 74   Temp 98.3 F (36.8 C) (Oral)   Resp 18   SpO2 95%   Visual Acuity Right Eye Distance:   Left Eye Distance:   Bilateral Distance:    Right Eye Near:   Left  Eye Near:    Bilateral Near:     Physical Exam Vitals and nursing note reviewed.  Constitutional:      General: She is not in acute distress.    Appearance: Normal appearance. She is not ill-appearing.  HENT:     Head: Normocephalic and atraumatic.     Right Ear: Tympanic membrane and ear canal normal.     Left Ear: Tympanic membrane and ear canal normal.     Nose:  No congestion or rhinorrhea.     Mouth/Throat:     Pharynx: No oropharyngeal exudate or posterior oropharyngeal erythema.  Eyes:     General: No scleral icterus.    Extraocular Movements: Extraocular movements intact.     Conjunctiva/sclera: Conjunctivae normal.  Pulmonary:     Effort: Pulmonary effort is normal. No respiratory distress.  Abdominal:     General: There is no distension.     Tenderness: There is abdominal tenderness in the suprapubic area. There is no right CVA tenderness, left CVA tenderness, guarding or rebound. Negative signs include Murphy's sign, McBurney's sign and obturator sign.  Musculoskeletal:     Cervical back: Normal range of motion. No rigidity.  Lymphadenopathy:     Cervical: No cervical adenopathy.  Skin:    Coloration: Skin is not jaundiced.     Findings: No rash.  Neurological:     General: No focal deficit present.     Mental Status: She is alert and oriented to person, place, and time.     Motor: No weakness.     Gait: Gait normal.  Psychiatric:        Mood and Affect: Mood normal.        Behavior: Behavior normal.      UC Treatments / Results  Labs (all labs ordered are listed, but only abnormal results are displayed) Labs Reviewed  URINE CULTURE  POCT URINALYSIS DIPSTICK, ED / UC    EKG   Radiology No results found.  Procedures Procedures (including critical care time)  Medications Ordered in UC Medications - No data to display  Initial Impression / Assessment and Plan / UC Course  I have reviewed the triage vital signs and the nursing notes.  Pertinent labs & imaging results that were available during my care of the patient were reviewed by me and considered in my medical decision making (see chart for details).     Urine normal, not concerning for UTI Ears normal, no signs of infection though she complains of  Final Clinical Impressions(s) / UC Diagnoses   Final diagnoses:  Ear pain, bilateral  Urine malodor      Discharge Instructions      Use drops as directed     ED Prescriptions     Medication Sig Dispense Auth. Provider   acetic acid 2 % otic solution Place 4 drops into both ears 3 (three) times daily. 15 mL Peri Jefferson, PA-C      PDMP not reviewed this encounter.   Peri Jefferson, PA-C 12/28/21 1210

## 2021-12-28 NOTE — Discharge Instructions (Signed)
Use drops as directed

## 2022-01-06 DIAGNOSIS — L659 Nonscarring hair loss, unspecified: Secondary | ICD-10-CM | POA: Diagnosis not present

## 2022-01-06 DIAGNOSIS — L669 Cicatricial alopecia, unspecified: Secondary | ICD-10-CM | POA: Diagnosis not present

## 2022-01-12 ENCOUNTER — Telehealth: Payer: Self-pay | Admitting: Internal Medicine

## 2022-01-12 DIAGNOSIS — M5416 Radiculopathy, lumbar region: Secondary | ICD-10-CM | POA: Diagnosis not present

## 2022-01-12 NOTE — Telephone Encounter (Signed)
Hey Lynn, LPN, can you please advise on this matter? Thanks  ?

## 2022-01-13 NOTE — Telephone Encounter (Signed)
          Completed form for [place] DE[employer] of Health and Financial controller for [place] PA form for [facility] faxed to [place] DEPT of Health and [employer] with confirmation received.

## 2022-01-13 NOTE — Telephone Encounter (Signed)
**Note De-Identified Cataleya Cristina Obfuscation** I have completed a [place] DEPT of Health and Human Services [place] form and have emailed it to [employer], Buyer, retail so she can have our DOD Dr Radford Pax sign and date it in Dr Aquilla Hacker absence and to then fax all to [facility] DEPT of Health and Human Services at the cover letter included.  I called the pt to advise her that we are leaving her 2 weeks of Entresto 24-26 mg samples in the front office for her to pick up but got no answer and could not leave a VM as the pts VM has not been set up.

## 2022-01-13 NOTE — Telephone Encounter (Signed)
Leaving pt 1 bottle of Entresto 24-26 mg tablets, 2 weeks supply, at the front desk at [facility] office for pt to pick up.  Lot#   [id number]  Exp: [phone]

## 2022-02-16 ENCOUNTER — Ambulatory Visit: Payer: Medicaid Other | Admitting: Critical Care Medicine

## 2022-02-18 ENCOUNTER — Ambulatory Visit: Payer: Medicaid Other | Admitting: Critical Care Medicine

## 2022-02-21 ENCOUNTER — Ambulatory Visit (HOSPITAL_COMMUNITY)
Admission: EM | Admit: 2022-02-21 | Discharge: 2022-02-21 | Disposition: A | Discharge status: Home / Self Care | Payer: Medicaid Other | Attending: Internal Medicine | Admitting: Internal Medicine

## 2022-02-21 ENCOUNTER — Other Ambulatory Visit: Payer: Self-pay

## 2022-02-21 ENCOUNTER — Encounter (HOSPITAL_COMMUNITY): Payer: Self-pay | Admitting: *Deleted

## 2022-02-21 DIAGNOSIS — R8290 Unspecified abnormal findings in urine: Secondary | ICD-10-CM

## 2022-02-21 DIAGNOSIS — R102 Pelvic and perineal pain: Secondary | ICD-10-CM

## 2022-02-21 DIAGNOSIS — H60543 Acute eczematoid otitis externa, bilateral: Secondary | ICD-10-CM

## 2022-02-21 LAB — POCT URINALYSIS DIPSTICK, ED / UC
Bilirubin Urine: NEGATIVE
Glucose, UA: NEGATIVE mg/dL
Hgb urine dipstick: NEGATIVE
Ketones, ur: NEGATIVE mg/dL
Leukocytes,Ua: NEGATIVE
Nitrite: NEGATIVE
Protein, ur: NEGATIVE mg/dL
Specific Gravity, Urine: 1.025 (ref 1.005–1.030)
Urobilinogen, UA: 0.2 mg/dL (ref 0.0–1.0)
pH: 6 (ref 5.0–8.0)

## 2022-02-21 MED ORDER — FLUOCINOLONE ACETONIDE 0.01 % OT OIL: 5.0000 [drp] | TOPICAL_OIL | Freq: Two times a day (BID) | OTIC | 0 refills | Status: DC

## 2022-02-21 NOTE — Discharge Instructions (Addendum)
I have called in fluocinolone otic oil, place 5 drops in each ear twice daily. This should help with the discharge and itching. Please do not put any paper in your ears.  This can make infection recur. Your urine does not appear to have an infection. We have obtained a vaginal swab to see if this is the cause of your symptoms.  We will call when we get the results and treat any positive results.

## 2022-02-21 NOTE — ED Triage Notes (Signed)
Pt reports bil lateral ear pain and drainage from bil ear for 2 weeks the patient also has AB pain with odor to urine for one week.

## 2022-02-21 NOTE — ED Provider Notes (Signed)
[employer]    CSN: [id number] Arrival date & time: 02/21/22  1518      History   Chief Complaint Chief Complaint  Patient presents with   Abdominal Pain   Dysuria   Otalgia    HPI Chelsea Branch is a 56 y.o. female.   Pleasant 56 year old female presents today with 2 separate concerns.  First she states she has been having ongoing ear pain for the past several months.  She was seen 2 months ago and given some type of eardrop.  She states that actually did help with her symptoms, but they have returned.  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  She is concerned that this is leading to infection.  She feels like it is wet and draining.  She denies any pain with opening her jaw or hearing loss.  No bloody discharge. Additionally, she is concerned about an odor in her urine for the past month, white and clear vaginal discharge intermittently, and pelvic pressure.  She is sexually active.  She reports "safe sex".  She denies any rash, severe pelvic pain, hematuria.   Abdominal Pain Associated symptoms: dysuria   Dysuria Associated symptoms: abdominal pain   Otalgia Associated symptoms: abdominal pain     Past Medical History:  Diagnosis Date   Abnormal thyroid blood test 03/15/2017   Abnormal uterine bleeding    Alopecia    Anemia    Angina pectoris with normal coronary arteriogram ([place]) 2017   Had + Troponin c/w ? 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 iniatially ?2/2 peripartum in 1994 - improved by 2008 then worsening EF in 2011 back down to EF  25-30%. b. echo 01/21/14 showed mod LVH, [name]; 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   Peripartum cardiomyopathy 1994   Sleep apnea 2015   CPAP 12/2013   Stroke ([place]) 4503   Systolic and diastolic CHF, acute on chronic ([place]) 06/14/2015   Tobacco abuse     Patient Active Problem List   Diagnosis Date Noted   Chronic pain syndrome 12/28/2021   Primary osteoarthritis of both knees 12/28/2021   Spinal stenosis of lumbar region 07/21/2021   Lumbar radiculitis 07/21/2021   COPD exacerbation ([place]) 07/21/2021   Mechanical complication due to intrauterine contraceptive device 07/21/2021   Arthropathy of lumbar facet joint 03/14/2019   Degenerative disc disease at L5-S1 level 10/10/2018   Abnormal thyroid blood test 03/15/2017   Hypokalemia 07/23/2016   Low TSH level 04/26/2016   Abnormal uterine bleeding 04/06/2016   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   History of non-ST elevation myocardial infarction (NSTEMI) 06/14/2015   Low back pain 09/16/2014   Homelessness 09/16/2014   Vitamin D deficiency 01/29/2014   OSA (obstructive sleep apnea) 01/08/2014   Chronic combined systolic and diastolic CHF (congestive heart failure) ([place]) 08/14/2013   Essential hypertension    Fibroids 08/09/2012  Menorrhagia 05/08/2012   Female pattern hair loss    HLD (hyperlipidemia) 06/08/2010   CEREBRAL ANEURYSM 06/08/2010   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   CEREBROVASCULAR ACCIDENT, HX OF 06/08/2010    Past Surgical History:  Procedure Laterality Date   CARDIAC CATHETERIZATION N/A 06/16/2015   Procedure: Left Heart Cath and Coronary Angiography;  Surgeon: Corky Crafts, MD;  Location[facility] INVASIVE CV LAB;  Service: Cardiovascular;  Laterality: N/A;   CESAREAN SECTION  1992  1994   LOOP RECORDER IMPLANT  ~ 2000   TIBIAL  TUBERCLERPLASTY     TUBAL LIGATION  1994    OB History     Gravida  2   Para  2   Term  2   Preterm      AB      Living  3      SAB      IAB      Ectopic      Multiple  1   Live Births  3            Home Medications    Prior to Admission medications   Medication Sig Start Date End Date Taking? Authorizing Provider  Fluocinolone Acetonide 0.01 % OIL Place 5 drops in ear(s) in the morning and at bedtime. 02/21/22  Yes Bexleigh Theriault L, PA  acetaminophen (TYLENOL) 500 MG tablet Take 1 tablet (500 mg total) by mouth every 6 (six) hours as needed. 05/19/21   Rhys Martini, PA-C  acetic acid 2 % otic solution Place 4 drops into both ears 3 (three) times daily. 12/28/21   Evern Core, PA-C  albuterol (VENTOLIN HFA) 108 (90 Base) MCG/ACT inhaler Inhale 1-2 puffs into the lungs every 6 (six) hours as needed for wheezing or shortness of breath. 07/21/21   Storm Frisk, MD  amoxicillin-clavulanate (AUGMENTIN) 875-125 MG tablet Take 1 tablet by mouth every 12 (twelve) hours. 10/30/21   Merrilee Jansky, MD  Blood Pressure Monitoring (BLOOD PRESSURE KIT) DEVI Use to measure blood pressure twice daily 07/22/21   Storm Frisk, MD  diclofenac Sodium (VOLTAREN) 1 % GEL Apply 4 g topically 4 (four) times daily. 09/23/21   Storm Frisk, MD  fluticasone (FLONASE) 50 MCG/ACT nasal spray Place 1 spray into both nostrils in the morning and at bedtime. 07/21/21   Storm Frisk, MD  gabapentin (NEURONTIN) 300 MG capsule Take by mouth. 10/07/21   [provider]  ibuprofen (ADVIL) 600 MG tablet Take 1 tablet (600 mg total) by mouth every 6 (six) hours as needed. 10/30/21   Lamptey, Britta Mccreedy, MD  lidocaine (LIDODERM) 5 % Place 1 patch onto the skin daily. Remove & Discard patch within 12 hours or as directed by MD 09/23/21   Storm Frisk, MD  magnesium oxide (MAG-OX) 400 MG tablet TAKE 1 CAPSULE (400 MG TOTAL) BY MOUTH 2 (TWO) TIMES DAILY. 07/21/21   Storm Frisk,  MD  metoprolol succinate (TOPROL-XL) 50 MG 24 hr tablet Take 1 tablet (50 mg total) by mouth daily. Please keep upcoming appt with Cardiologist in March 2023 before anymore refills. Thank you Final attempt 09/23/21   Storm Frisk, MD  Misc. Devices MISC Please provide patient with insurance approved straight cane 09/29/18   Claiborne Rigg, NP  Misc. Devices MISC Single-point cane with adjustable height x1, shower chair x1, transfer bench x1.  Diagnosis congestive heart failure, degenerative disc disease of the lumbar spine. 11/23/18   [name], MD  potassium chloride SA (KLOR-CON M20) 20 MEQ tablet TAKE 1-2 TABS BY MOUTH AS DIRECTED. TAKE 2 TABLETS IN THE MORNING AND 1 TABLET IN THE EVENING 09/23/21   Elsie Stain, MD  sacubitril-valsartan (ENTRESTO) 24-26 MG Take 1 tablet by mouth 2 (two) times daily. >>SEE DIRECTIONS AS YOUR DOSE HAS CHANGED<< 09/23/21   Elsie Stain, MD  simvastatin (ZOCOR) 10 MG tablet TAKE 1 TABLET BY MOUTH EVERY DAY IN THE EVENING. Please make overdue appt with Dr. Caryl Comes before anymore refills. Thank you 1st attempt 09/23/21   Elsie Stain, MD  spironolactone (ALDACTONE) 25 MG tablet Take 0.5 tablets (12.5 mg total) by mouth daily. 09/23/21   Elsie Stain, MD  tiZANidine (ZANAFLEX) 4 MG tablet Take 1 tablet (4 mg total) by mouth every 8 (eight) hours as needed for muscle spasms. DO NOT DRINK ALCOHOL OR DRIVE WHILE TAKING THIS MEDICATION 10/17/21   Nyoka Lint, PA-C  torsemide (DEMADEX) 20 MG tablet Take 1 tablet (20 mg total) by mouth daily. 09/23/21   Elsie Stain, MD  traMADol Veatrice [place]bon) 50 MG tablet take one tablet po q 8-12 hours as needed for severe pain only 10/07/21   [provider]  DULoxetine (CYMBALTA) 30 MG capsule Take 1 capsule (30 mg total) by mouth daily. 12/07/18 12/10/19  Charlott Rakes, MD  famotidine (PEPCID) 20 MG tablet Take 1 tablet (20 mg total) 2 (two) times daily by mouth. 04/10/17 11/03/19  Albesa Seen, PA-C    Family  History Family History  Problem Relation Age of Onset   Cancer Maternal Grandmother        uterine   Hypertension Sister    Healthy Mother    Other Neg Hx    Heart disease Neg Hx     Social History Social History   Tobacco Use   Smoking status: Former    Packs/day: 0.10    Years: 30.00    Total pack years: 3.00    Types: Cigarettes    Quit date: 06/03/2015    Years since quitting: 6.7   Smokeless tobacco: Never   Tobacco comments:    Pt. stated she stopped smoking a year ago. 09/29/2018  Vaping Use   Vaping Use: Never used  Substance Use Topics   Alcohol use: No    Alcohol/week: 0.0 standard drinks of alcohol   Drug use: No     Allergies   Ace inhibitors   Review of Systems Review of Systems  HENT:  Positive for ear pain.   Gastrointestinal:  Positive for abdominal pain.  Genitourinary:  Positive for dysuria.     Physical Exam Triage Vital Signs ED Triage Vitals  Enc Vitals Group     BP 02/21/22 1717 (!) 161/99     Pulse Rate 02/21/22 1717 64     Resp 02/21/22 1717 18     Temp 02/21/22 1717 98.2 F (36.8 C)     Temp src --      SpO2 02/21/22 1717 100 %     Weight --      Height --      Head Circumference --      Peak Flow --      Pain Score 02/21/22 1714 9     Pain Loc --      Pain Edu? --      Excl. in [place]? --    No data found.  Updated Vital Signs BP (!) 161/99   Pulse 64   Temp 98.2 F (  36.8 C)   Resp 18   SpO2 100%   Visual Acuity Right Eye Distance:   Left Eye Distance:   Bilateral Distance:    Right Eye Near:   Left Eye Near:    Bilateral Near:     Physical Exam Vitals and nursing note reviewed.  Constitutional:      General: She is not in acute distress.    Appearance: Normal appearance. She is well-developed and normal weight. She is not ill-appearing, toxic-appearing or diaphoretic.  HENT:     Head: Normocephalic and atraumatic.     Right Ear: Tympanic membrane and external ear normal.     Left Ear: Tympanic membrane  and external ear normal.     Ears:     Comments: B eczematous external auditory canals noted Minimal swelling to L ear canal NO discharge or debris No pain with movement of pinna or tragus    Nose: Nose normal.     Right Turbinates: Not enlarged or swollen.     Left Turbinates: Not enlarged or swollen.  Eyes:     Conjunctiva/sclera: Conjunctivae normal.  Cardiovascular:     Rate and Rhythm: Normal rate and regular rhythm.     Heart sounds: No murmur heard. Pulmonary:     Effort: Pulmonary effort is normal. No respiratory distress.     Breath sounds: Normal breath sounds.  Abdominal:     General: Abdomen is flat. Bowel sounds are normal. There is no distension.     Palpations: Abdomen is soft. There is no mass.     Tenderness: There is no abdominal tenderness. There is no right CVA tenderness, left CVA tenderness, guarding or rebound.     Hernia: No hernia is present.  Musculoskeletal:        General: No swelling.     Cervical back: Neck supple.  Skin:    General: Skin is warm and dry.     Capillary Refill: Capillary refill takes less than 2 seconds.     Findings: No erythema or rash.  Neurological:     Mental Status: She is alert.  Psychiatric:        Mood and Affect: Mood normal.      UC Treatments / Results  Labs (all labs ordered are listed, but only abnormal results are displayed) Labs Reviewed  POCT URINALYSIS DIPSTICK, ED / UC  CERVICOVAGINAL ANCILLARY ONLY    EKG   Radiology No results found.  Procedures Procedures (including critical care time)  Medications Ordered in UC Medications - No data to display  Initial Impression / Assessment and Plan / UC Course  I have reviewed the triage vital signs and the nursing notes.  Pertinent labs & imaging results that were available during my care of the patient were reviewed by me and considered in my medical decision making (see chart for details).     Acute eczematous otitis externa -patient did respond  slightly to acetic acid eardrops previously.  Patient continues to put tissue paper in her ears.  Encouraged patient to avoid any foreign body in ears, will start fluocinolone acetic oil. Pelvic pressure -UA unremarkable.  Aptima swab obtained to assess for possible infectious causes. Abnormal urine odor -increase water intake.  No evidence of infection on dipstick.   Final Clinical Impressions(s) / UC Diagnoses   Final diagnoses:  Acute eczematoid otitis externa of both ears  Pelvic pressure in female  Abnormal urine odor     Discharge Instructions      I  have called in fluocinolone otic oil, place 5 drops in each ear twice daily. This should help with the discharge and itching. Please do not put any paper in your ears.  This can make infection recur. Your urine does not appear to have an infection. We have obtained a vaginal swab to see if this is the cause of your symptoms.  We will call when we get the results and treat any positive results.     ED Prescriptions     Medication Sig Dispense Auth. Provider   Fluocinolone Acetonide 0.01 % OIL Place 5 drops in ear(s) in the morning and at bedtime. 20 mL Binh Doten L, PA      PDMP not reviewed this encounter.   Chaney Malling, [place] 02/21/22 2035

## 2022-02-22 ENCOUNTER — Telehealth (HOSPITAL_COMMUNITY): Payer: Self-pay | Admitting: Emergency Medicine

## 2022-02-22 LAB — CERVICOVAGINAL ANCILLARY ONLY
Bacterial Vaginitis (gardnerella): POSITIVE — AB
Candida Glabrata: NEGATIVE
Candida Vaginitis: NEGATIVE
Chlamydia: NEGATIVE
Comment: NEGATIVE
Comment: NEGATIVE
Comment: NEGATIVE
Comment: NEGATIVE
Comment: NEGATIVE
Comment: NORMAL
Neisseria Gonorrhea: NEGATIVE
Trichomonas: NEGATIVE

## 2022-02-22 MED ORDER — METRONIDAZOLE 500 MG PO TABS: 500.0000 mg | ORAL_TABLET | Freq: Two times a day (BID) | ORAL | 0 refills | Status: DC

## 2022-02-25 ENCOUNTER — Ambulatory Visit: Payer: Medicaid Other | Admitting: Physician Assistant

## 2022-02-25 ENCOUNTER — Ambulatory Visit (HOSPITAL_COMMUNITY)
Admission: EM | Admit: 2022-02-25 | Discharge: 2022-02-25 | Disposition: A | Discharge status: Home / Self Care | Payer: Medicaid Other | Attending: Family Medicine | Admitting: Family Medicine

## 2022-02-25 ENCOUNTER — Encounter (HOSPITAL_COMMUNITY): Payer: Self-pay | Admitting: *Deleted

## 2022-02-25 DIAGNOSIS — Z1152 Encounter for screening for COVID-19: Secondary | ICD-10-CM | POA: Diagnosis not present

## 2022-02-25 DIAGNOSIS — J069 Acute upper respiratory infection, unspecified: Secondary | ICD-10-CM | POA: Insufficient documentation

## 2022-02-25 DIAGNOSIS — R059 Cough, unspecified: Secondary | ICD-10-CM | POA: Insufficient documentation

## 2022-02-25 LAB — RESP PANEL BY RT-PCR (FLU A&B, COVID) ARPGX2
Influenza A by PCR: NEGATIVE
Influenza B by PCR: NEGATIVE
SARS Coronavirus 2 by RT PCR: NEGATIVE

## 2022-02-25 MED ORDER — CHERATUSSIN AC 100-10 MG/5ML PO SOLN: 5.0000 mL | Freq: Four times a day (QID) | ORAL | 0 refills | Status: DC | PRN

## 2022-02-25 MED ORDER — NEOMYCIN-POLYMYXIN-HC 3.5-10000-1 OT SOLN: 4.0000 [drp] | Freq: Four times a day (QID) | OTIC | 0 refills | Status: AC

## 2022-02-25 NOTE — ED Triage Notes (Signed)
Pt states that she has bilateral ear drainage and pain x 1 week. Was seen on 02/21/2022. She is fatigued, cough, sore throat since yesterday. She has only taken two tylenol this morning.

## 2022-02-25 NOTE — Discharge Instructions (Addendum)
  You have been swabbed for COVID and flu, and the test will result in the next 24 hours. Our staff will call you if positive. If the covid test is positive, you should quarantine for 5 days from the start of your symptoms  Put Cortisporin eardrops in the both ears 4 times a day for 7 days  Take Robitussin with codeine cough syrup--5 mL or 1 teaspoon every 6 hours as needed for cough.

## 2022-02-25 NOTE — ED Provider Notes (Signed)
[facility]    CSN: [id number] Arrival date & time: 02/25/22  1102      History   Chief Complaint Chief Complaint  Patient presents with   Ear Drainage   Eye Drainage   Fatigue    HPI Chelsea Branch is a 56 y.o. female.    Ear Drainage   Here for a 1 week history of bilateral ear pain and drainage.  She states they have drained some clear fluid.  They are itching some.  Then yesterday and overnight she began having cough and congestion and malaise.  She is also had some subjective fever and chills.  Her stomach is felt a little upset and she has had some loose stools, but no vomiting.  Past Medical History:  Diagnosis Date   Abnormal thyroid blood test 03/15/2017   Abnormal uterine bleeding    Alopecia    Anemia    Angina pectoris with normal coronary arteriogram ([place]) 2017   Had + Troponin c/w ? 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 iniatially ?2/2 peripartum in 1994 - improved by 2008 then worsening EF in 2011 back down to EF 25-30%. b. echo 01/21/14 showed mod LVH, [name]; 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   Peripartum cardiomyopathy 1994   Sleep apnea 2015   CPAP 12/2013   Stroke ([place]) 9191   Systolic and diastolic CHF, acute on chronic ([place]) 06/14/2015   Tobacco abuse     Patient Active Problem List   Diagnosis Date Noted   Chronic pain syndrome 12/28/2021   Primary osteoarthritis of both knees 12/28/2021   Spinal stenosis of lumbar region 07/21/2021   Lumbar  radiculitis 07/21/2021   COPD exacerbation ([place]) 07/21/2021   Mechanical complication due to intrauterine contraceptive device 07/21/2021   Arthropathy of lumbar facet joint 03/14/2019   Degenerative disc disease at L5-S1 level 10/10/2018   Abnormal thyroid blood test 03/15/2017   Hypokalemia 07/23/2016   Low TSH level 04/26/2016   Abnormal uterine bleeding 04/06/2016   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   History of non-ST elevation myocardial infarction (NSTEMI) 06/14/2015   Low back pain 09/16/2014   Homelessness 09/16/2014   Vitamin D deficiency 01/29/2014   OSA (obstructive sleep apnea) 01/08/2014   Chronic combined systolic and diastolic CHF (congestive heart failure) ([place]) 08/14/2013   Essential hypertension    Fibroids 08/09/2012   Menorrhagia 05/08/2012   Female pattern hair loss    HLD (hyperlipidemia) 06/08/2010   CEREBRAL ANEURYSM 06/08/2010   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 Authorizing Provider  acetaminophen (TYLENOL) 500 MG tablet Take 1 tablet (500 mg total) by mouth every 6 (six) hours as needed. 05/19/21  Yes Hazel Sams, PA-C  acetic acid 2 % otic solution Place 4 drops into both ears 3 (three) times daily. 12/28/21  Yes Peri Jefferson, PA-C   Blood Pressure Monitoring (BLOOD PRESSURE KIT) DEVI Use to measure blood pressure twice daily 07/22/21  Yes Elsie Stain, MD  diclofenac Sodium (VOLTAREN) 1 % GEL Apply 4 g topically 4 (four) times daily. 09/23/21  Yes Elsie Stain, MD  Fluocinolone Acetonide 0.01 % OIL Place 5 drops in ear(s) in the morning and at bedtime. 02/21/22  Yes Crain, Whitney L, PA  gabapentin (NEURONTIN) 300 MG capsule Take by mouth. 10/07/21  Yes [provider]  guaiFENesin-codeine (CHERATUSSIN AC) 100-10 MG/5ML syrup Take 5 mLs by mouth 4 (four) times daily as needed for cough. 02/25/22  Yes Estefani Bateson, Gwenlyn Perking, MD  lidocaine (LIDODERM) 5 % Place 1 patch onto the skin daily. Remove & Discard patch within 12 hours or as directed by MD 09/23/21  Yes Elsie Stain, MD  magnesium oxide (MAG-OX) 400 MG tablet TAKE 1 CAPSULE (400 MG TOTAL) BY MOUTH 2 (TWO) TIMES DAILY. 07/21/21  Yes Elsie Stain, MD  metoprolol succinate (TOPROL-XL) 50 MG 24 hr tablet Take 1 tablet (50 mg total) by mouth daily. Please keep upcoming appt with Cardiologist in March 2023 before anymore refills. Thank you Final attempt 09/23/21  Yes Elsie Stain, MD  Misc. Devices MISC Please provide patient with insurance approved straight cane 09/29/18  Yes Gildardo Pounds, NP  Misc. Devices MISC Single-point cane with adjustable height x1, shower chair x1, transfer bench x1.  Diagnosis congestive heart failure, degenerative disc disease of the lumbar spine. 11/23/18  Yes Charlott Rakes, MD  neomycin-polymyxin-hydrocortisone (CORTISPORIN) OTIC solution Place 4 drops into the right ear 4 (four) times daily for 7 days. 02/25/22 03/04/22 Yes Raheem Kolbe, Gwenlyn Perking, MD  potassium chloride SA (KLOR-CON M20) 20 MEQ tablet TAKE 1-2 TABS BY MOUTH AS DIRECTED. TAKE 2 TABLETS IN THE MORNING AND 1 TABLET IN THE EVENING 09/23/21  Yes Elsie Stain, MD  sacubitril-valsartan (ENTRESTO) 24-26 MG Take 1 tablet by mouth 2 (two) times daily. >>SEE DIRECTIONS AS YOUR  DOSE HAS CHANGED<< 09/23/21  Yes Elsie Stain, MD  spironolactone (ALDACTONE) 25 MG tablet Take 0.5 tablets (12.5 mg total) by mouth daily. 09/23/21  Yes Elsie Stain, MD  tiZANidine (ZANAFLEX) 4 MG tablet Take 1 tablet (4 mg total) by mouth every 8 (eight) hours as needed for muscle spasms. DO NOT DRINK ALCOHOL OR DRIVE WHILE TAKING THIS MEDICATION 10/17/21  Yes Nyoka Lint, PA-C  torsemide (DEMADEX) 20 MG tablet Take 1 tablet (20 mg total) by mouth daily. 09/23/21  Yes Elsie Stain, MD  traMADol (ULTRAM) 50 MG tablet take one tablet po q 8-12 hours as needed for  severe pain only 10/07/21  Yes [provider]  albuterol (VENTOLIN HFA) 108 (90 Base) MCG/ACT inhaler Inhale 1-2 puffs into the lungs every 6 (six) hours as needed for wheezing or shortness of breath. 07/21/21   Elsie Stain, MD  fluticasone (FLONASE) 50 MCG/ACT nasal spray Place 1 spray into both nostrils in the morning and at bedtime. 07/21/21   Elsie Stain, MD  simvastatin (ZOCOR) 10 MG tablet TAKE 1 TABLET BY MOUTH EVERY DAY IN THE EVENING. Please make overdue appt with Dr. Caryl Comes before anymore refills. Thank you 1st attempt 09/23/21   Elsie Stain, MD  DULoxetine (CYMBALTA) 30 MG capsule Take 1 capsule (30 mg total) by mouth daily. 12/07/18 12/10/19  Charlott Rakes, MD  famotidine (PEPCID) 20 MG tablet Take 1 tablet (20 mg total) 2 (two) times daily by mouth. 04/10/17 11/03/19  Albesa Seen, PA-C    Family History Family History  Problem Relation Age of Onset   Cancer Maternal Grandmother        uterine   Hypertension Sister    Healthy Mother    Other Neg Hx    Heart disease Neg Hx     Social History Social History   Tobacco Use   Smoking status: Former    Packs/day: 0.10    Years: 30.00    Total pack years: 3.00    Types: Cigarettes    Quit date: 06/03/2015    Years since quitting: 6.7   Smokeless tobacco: Never   Tobacco comments:    Pt. stated she stopped smoking a year ago.  09/29/2018  Vaping Use   Vaping Use: Never used  Substance Use Topics   Alcohol use: No    Alcohol/week: 0.0 standard drinks of alcohol   Drug use: No     Allergies   Ace inhibitors   Review of Systems Review of Systems   Physical Exam Triage Vital Signs ED Triage Vitals  Enc Vitals Group     BP 02/25/22 1229 128/81     Pulse Rate 02/25/22 1229 78     Resp 02/25/22 1229 18     Temp 02/25/22 1229 98.8 F (37.1 C)     Temp Source 02/25/22 1229 Oral     SpO2 02/25/22 1229 95 %     Weight --      Height --      Head Circumference --      Peak Flow --      Pain Score 02/25/22 1227 8     Pain Loc --      Pain Edu? --      Excl. in [place]? --    No data found.  Updated Vital Signs BP 128/81 (BP Location: Left Arm)   Pulse 78   Temp 98.8 F (37.1 C) (Oral)   Resp 18   SpO2 95%   Visual Acuity Right Eye Distance:   Left Eye Distance:   Bilateral Distance:    Right Eye Near:   Left Eye Near:    Bilateral Near:     Physical Exam Vitals reviewed.  Constitutional:      General: She is not in acute distress.    Appearance: She is not toxic-appearing.  HENT:     Ears:     Comments: Right tympanic membrane is gray and shiny.  The canals on the right might be a little swollen.  There is no discharge in the canal that I can see.  On the left there is  some cerumen obscuring the left tympanic membrane, but there is some swelling of the canals there also.    Nose: Nose normal.     Mouth/Throat:     Mouth: Mucous membranes are moist.     Pharynx: No oropharyngeal exudate or posterior oropharyngeal erythema.  Eyes:     Extraocular Movements: Extraocular movements intact.     Conjunctiva/sclera: Conjunctivae normal.     Pupils: Pupils are equal, round, and reactive to light.  Cardiovascular:     Rate and Rhythm: Normal rate and regular rhythm.     Heart sounds: No murmur heard. Pulmonary:     Effort: Pulmonary effort is normal. No respiratory distress.     Breath  sounds: No stridor. No wheezing, rhonchi or rales.  Musculoskeletal:     Cervical back: Neck supple.  Lymphadenopathy:     Cervical: No cervical adenopathy.  Skin:    Capillary Refill: Capillary refill takes less than 2 seconds.     Coloration: Skin is not jaundiced or pale.  Neurological:     General: No focal deficit present.     Mental Status: She is alert and oriented to person, place, and time.  Psychiatric:        Behavior: Behavior normal.      UC Treatments / Results  Labs (all labs ordered are listed, but only abnormal results are displayed) Labs Reviewed  RESP PANEL BY RT-PCR (FLU A&B, COVID) [id number]    EKG   Radiology No results found.  Procedures Procedures (including critical care time)  Medications Ordered in UC Medications - No data to display  Initial Impression / Assessment and Plan / UC Course  I have reviewed the triage vital signs and the nursing notes.  Pertinent labs & imaging results that were available during my care of the patient were reviewed by me and considered in my medical decision making (see chart for details).       COVID and flu swab is done.  She is a candidate for Tamiflu if positive for the flu.  She is a candidate for Paxlovid if she is positive for COVID as her EGFR was 77 in May of this year.  I have sent in Cortisporin for possible otitis externa.  And codeine cough syrup for as needed use  Final Clinical Impressions(s) / UC Diagnoses   Final diagnoses:  Viral URI with cough     Discharge Instructions       You have been swabbed for COVID and flu, and the test will result in the next 24 hours. Our staff will call you if positive. If the covid test is positive, you should quarantine for 5 days from the start of your symptoms  Put Cortisporin eardrops in the both ears 4 times a day for 7 days  Take Robitussin with codeine cough syrup--5 mL or 1 teaspoon every 6 hours as needed for cough.        ED  Prescriptions     Medication Sig Dispense Auth. Provider   neomycin-polymyxin-hydrocortisone (CORTISPORIN) OTIC solution Place 4 drops into the right ear 4 (four) times daily for 7 days. 10 mL Barrett Henle, MD   guaiFENesin-codeine [facility]) 100-10 MG/5ML syrup Take 5 mLs by mouth 4 (four) times daily as needed for cough. 120 mL Barrett Henle, MD      I have reviewed the PDMP during this encounter.   Barrett Henle, MD 02/25/22 [phone]

## 2022-03-04 ENCOUNTER — Ambulatory Visit: Payer: Medicaid Other | Admitting: Critical Care Medicine

## 2022-04-11 ENCOUNTER — Ambulatory Visit (HOSPITAL_COMMUNITY)
Admission: EM | Admit: 2022-04-11 | Discharge: 2022-04-11 | Disposition: A | Discharge status: Home / Self Care | Payer: Medicaid Other | Attending: Physician Assistant | Admitting: Physician Assistant

## 2022-04-11 ENCOUNTER — Encounter (HOSPITAL_COMMUNITY): Payer: Self-pay

## 2022-04-11 DIAGNOSIS — K047 Periapical abscess without sinus: Secondary | ICD-10-CM | POA: Diagnosis not present

## 2022-04-11 DIAGNOSIS — H60543 Acute eczematoid otitis externa, bilateral: Secondary | ICD-10-CM

## 2022-04-11 DIAGNOSIS — K0889 Other specified disorders of teeth and supporting structures: Secondary | ICD-10-CM

## 2022-04-11 MED ORDER — FLUOCINOLONE ACETONIDE 0.01 % OT OIL: 5.0000 [drp] | TOPICAL_OIL | Freq: Two times a day (BID) | OTIC | 0 refills | Status: DC

## 2022-04-11 MED ORDER — AMOXICILLIN-POT CLAVULANATE 875-125 MG PO TABS: 1.0000 | ORAL_TABLET | Freq: Two times a day (BID) | ORAL | 0 refills | Status: DC

## 2022-04-11 MED ORDER — IBUPROFEN 800 MG PO TABS: 800.0000 mg | ORAL_TABLET | Freq: Three times a day (TID) | ORAL | 0 refills | Status: DC

## 2022-04-11 NOTE — Discharge Instructions (Addendum)
Take Augmentin as prescribed.  This should cover an infection.  Take ibuprofen 800 mg for pain.  Do not take additional NSAIDs with this medication including aspirin, ibuprofen/Advil, naproxen/Aleve.  You can use Tylenol for breakthrough pain.  I do recommend you follow-up with a dentist.  Call them to schedule an appointment.  If anything changes or worsens he needs to be seen immediately including trouble speaking, trouble swallowing, shortness of breath, fever.  I believe that you have eczema in your ear which is causing the irritation/itching and occasional drainage.  Use drops as prescribed.  Do not place anything in your ear including earbuds, earplugs, Q-tips.  If you develop any pain, swelling, change in drainage, change in hearing, fever you need to be seen immediately.

## 2022-04-11 NOTE — ED Provider Notes (Signed)
[place]    CSN: [id number] Arrival date & time: 04/11/22  1136      History   Chief Complaint Chief Complaint  Patient presents with   Otalgia    HPI Chelsea Branch is a 56 y.o. female.   Patient presents today with several concerns.  Her primary concern today is a 3-day history of left lower jaw pain.  She is concerned for dental infection.  Denies any recent injury or broken tooth; does have a missing tooth in this area.  She is not followed closely by dentistry and denies any recent cleaning or dental procedure.  She has taken Tylenol and ibuprofen with only temporary relief of symptoms.  Denies any dysphagia, muffled voice, shortness of breath, swelling of her throat.  She is eating and drinking despite symptoms.  Denies any recent antibiotics.  In addition, she reports a several week history of itching and drainage from her ears.  She has been seen for this in the past as recently as 02/21/2022 at which point she was prescribed acetic acid drops and fluocinolone acetonide drops with improvement of symptoms.  She is requesting a refill of these medications.  Does not use earbuds, earplugs, Q-tips.  Denies any recent swimming or airplane travel.    Past Medical History:  Diagnosis Date   Abnormal thyroid blood test 03/15/2017   Abnormal uterine bleeding    Alopecia    Anemia    Angina pectoris with normal coronary arteriogram ([facility]) 2017   Had + Troponin c/w ? 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 iniatially ?2/2 peripartum in 1994 - improved by 2008 then worsening EF in 2011 back down to EF 25-30%. b. echo 01/21/14 showed mod  LVH, [name]; 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   Peripartum cardiomyopathy 1994   Sleep apnea 2015   CPAP 12/2013   Stroke ([facility]) 4680   Systolic and diastolic CHF, acute on chronic ([place]) 06/14/2015   Tobacco abuse     Patient Active Problem List   Diagnosis Date Noted   Chronic pain syndrome 12/28/2021   Primary osteoarthritis of both knees 12/28/2021   Spinal stenosis of lumbar region 07/21/2021   Lumbar radiculitis 07/21/2021   COPD exacerbation ([facility]) 07/21/2021   Mechanical complication due to intrauterine contraceptive device 07/21/2021   Arthropathy of lumbar facet joint 03/14/2019   Degenerative disc disease at L5-S1 level 10/10/2018   Abnormal thyroid blood test 03/15/2017   Hypokalemia 07/23/2016   Low TSH level 04/26/2016   Abnormal uterine bleeding 04/06/2016   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   CEREBROVASCULAR ACCIDENT, HX OF 06/08/2010    Past Surgical History:  Procedure Laterality Date   CARDIAC CATHETERIZATION N/A 06/16/2015   Procedure: Left Heart Cath and Coronary Angiography;  Surgeon: Jettie Booze, MD;  Location: [facility];  Service: Cardiovascular;  Laterality: N/A;   CESAREAN SECTION  1992  1994   LOOP RECORDER IMPLANT  ~ [place]     TUBAL LIGATION   1994    OB History     Gravida  2   Para  2   Term  2   Preterm      AB      Living  3      SAB      IAB      Ectopic      Multiple  1   Live Births  3            Home Medications    Prior to Admission medications   Medication Sig Start Date End Date Taking? Authorizing Provider  acetaminophen (TYLENOL) 500 MG tablet Take 1 tablet (500 mg total) by mouth every 6 (six) hours as needed. 05/19/21   Hazel Sams, PA-C  acetic acid 2 % otic solution Place 4 drops into both ears 3 (three) times daily. 12/28/21   Peri Jefferson, PA-C  albuterol (VENTOLIN HFA) 108 (90 Base) MCG/ACT inhaler Inhale 1-2 puffs into the lungs every 6 (six) hours as needed for wheezing or shortness of breath. 07/21/21   Elsie Stain, MD  amoxicillin-clavulanate (AUGMENTIN) 875-125 MG tablet Take 1 tablet by mouth every 12 (twelve) hours. 04/11/22  Yes Faithe Ariola, Derry Skill, PA-C  Blood Pressure Monitoring (BLOOD PRESSURE KIT) DEVI Use to measure blood pressure twice daily 07/22/21   Elsie Stain, MD  diclofenac Sodium (VOLTAREN) 1 % GEL Apply 4 g topically 4 (four) times daily. 09/23/21   Elsie Stain, MD  Fluocinolone Acetonide 0.01 % OIL Place 5 drops in ear(s) in the morning and at bedtime. 04/11/22   Cerina Leary, Derry Skill, PA-C  fluticasone (FLONASE) 50 MCG/ACT nasal spray Place 1 spray into both nostrils in the morning and at bedtime. 07/21/21   Elsie Stain, MD  gabapentin (NEURONTIN) 300 MG capsule Take by mouth. 10/07/21   [provider]  guaiFENesin-codeine (CHERATUSSIN AC) 100-10 MG/5ML syrup Take 5 mLs by mouth 4 (four) times daily as needed for cough. 02/25/22   Barrett Henle, MD  ibuprofen (ADVIL) 800 MG tablet Take 1 tablet (800 mg total) by mouth 3 (three) times daily. 04/11/22  Yes Kitara Hebb K, PA-C  lidocaine (LIDODERM) 5 % Place 1 patch onto the skin daily. Remove & Discard patch within 12 hours or as directed by MD 09/23/21   Elsie Stain, MD  magnesium  oxide (MAG-OX) 400 MG tablet TAKE 1 CAPSULE (400 MG TOTAL) BY MOUTH 2 (TWO) TIMES DAILY. 07/21/21   Elsie Stain, MD  metoprolol succinate (TOPROL-XL) 50 MG 24 hr tablet Take 1 tablet (50 mg total) by mouth daily. Please keep upcoming appt with Cardiologist in March 2023 before anymore refills. Thank you Final attempt 09/23/21   Elsie Stain, MD  Misc. Devices MISC Please provide patient with insurance approved straight cane 09/29/18   Gildardo Pounds, NP  Misc. Devices MISC Single-point cane with adjustable height x1, shower chair x1, transfer bench x1.  Diagnosis congestive heart failure,  degenerative disc disease of the lumbar spine. 11/23/18   Charlott Rakes, MD  potassium chloride SA (KLOR-CON M20) 20 MEQ tablet TAKE 1-2 TABS BY MOUTH AS DIRECTED. TAKE 2 TABLETS IN THE MORNING AND 1 TABLET IN THE EVENING 09/23/21   Elsie Stain, MD  sacubitril-valsartan (ENTRESTO) 24-26 MG Take 1 tablet by mouth 2 (two) times daily. >>SEE DIRECTIONS AS YOUR DOSE HAS CHANGED<< 09/23/21   Elsie Stain, MD  simvastatin (ZOCOR) 10 MG tablet TAKE 1 TABLET BY MOUTH EVERY DAY IN THE EVENING. Please make overdue appt with Dr. Caryl Comes before anymore refills. Thank you 1st attempt 09/23/21   Elsie Stain, MD  spironolactone (ALDACTONE) 25 MG tablet Take 0.5 tablets (12.5 mg total) by mouth daily. 09/23/21   Elsie Stain, MD  tiZANidine (ZANAFLEX) 4 MG tablet Take 1 tablet (4 mg total) by mouth every 8 (eight) hours as needed for muscle spasms. DO NOT DRINK ALCOHOL OR DRIVE WHILE TAKING THIS MEDICATION 10/17/21   Nyoka Lint, PA-C  torsemide (DEMADEX) 20 MG tablet Take 1 tablet (20 mg total) by mouth daily. 09/23/21   Elsie Stain, MD  traMADol Veatrice Bourbon) 50 MG tablet take one tablet po q 8-12 hours as needed for severe pain only 10/07/21   [provider]  DULoxetine (CYMBALTA) 30 MG capsule Take 1 capsule (30 mg total) by mouth daily. 12/07/18 12/10/19  Charlott Rakes, MD  famotidine (PEPCID) 20 MG  tablet Take 1 tablet (20 mg total) 2 (two) times daily by mouth. 04/10/17 11/03/19  Albesa Seen, PA-C    Family History Family History  Problem Relation Age of Onset   Cancer Maternal Grandmother        uterine   Hypertension Sister    Healthy Mother    Other Neg Hx    Heart disease Neg Hx     Social History Social History   Tobacco Use   Smoking status: Former    Packs/day: 0.10    Years: 30.00    Total pack years: 3.00    Types: Cigarettes    Quit date: 06/03/2015    Years since quitting: 6.8   Smokeless tobacco: Never   Tobacco comments:    Pt. stated she stopped smoking a year ago. 09/29/2018  Vaping Use   Vaping Use: Never used  Substance Use Topics   Alcohol use: No    Alcohol/week: 0.0 standard drinks of alcohol   Drug use: No     Allergies   Ace inhibitors   Review of Systems Review of Systems  Constitutional:  Positive for activity change. Negative for appetite change, fatigue and fever.  HENT:  Positive for dental problem and ear discharge. Negative for congestion, ear pain, sore throat, trouble swallowing and voice change.   Respiratory:  Negative for cough and shortness of breath.   Cardiovascular:  Negative for chest pain.  Gastrointestinal:  Negative for abdominal pain, diarrhea, nausea and vomiting.     Physical Exam Triage Vital Signs ED Triage Vitals  Enc Vitals Group     BP 04/11/22 1234 (!) 154/91     Pulse Rate 04/11/22 1234 77     Resp 04/11/22 1234 12     Temp 04/11/22 1234 98.6 F (37 C)     Temp Source 04/11/22 1234 Oral     SpO2 04/11/22 1234 95 %     Weight --      Height --      Head Circumference --  Peak Flow --      Pain Score 04/11/22 1232 10     Pain Loc --      Pain Edu? --      Excl. in [place]? --    No data found.  Updated Vital Signs BP (!) 154/91 (BP Location: Left Arm)   Pulse 77   Temp 98.6 F (37 C) (Oral)   Resp 12   SpO2 95%   Visual Acuity Right Eye Distance:   Left Eye Distance:    Bilateral Distance:    Right Eye Near:   Left Eye Near:    Bilateral Near:     Physical Exam Vitals reviewed.  Constitutional:      General: She is awake. She is not in acute distress.    Appearance: Normal appearance. She is well-developed. She is not ill-appearing.     Comments: Very pleasant female appears stated age in no acute distress sitting comfortably in exam room  HENT:     Head: Normocephalic and atraumatic.     Right Ear: Tympanic membrane, ear canal and external ear normal. Tympanic membrane is not erythematous or bulging.     Left Ear: Tympanic membrane, ear canal and external ear normal. Tympanic membrane is not erythematous or bulging.     Ears:     Comments: No pain with palpation of tragus or manipulation of external ear.  Scaling eczematous appearance of external auditory canal.  No significant erythema or drainage noted.    Nose:     Right Sinus: No maxillary sinus tenderness or frontal sinus tenderness.     Left Sinus: No maxillary sinus tenderness or frontal sinus tenderness.     Mouth/Throat:     Dentition: Abnormal dentition. Dental tenderness, gingival swelling and dental abscesses present.     Pharynx: Uvula midline. No oropharyngeal exudate or posterior oropharyngeal erythema.      Comments: No evidence of Ludwig angina. Cardiovascular:     Rate and Rhythm: Normal rate and regular rhythm.     Heart sounds: Normal heart sounds, S1 normal and S2 normal. No murmur heard. Pulmonary:     Effort: Pulmonary effort is normal.     Breath sounds: Normal breath sounds. No wheezing, rhonchi or rales.     Comments: Clear to auscultation bilaterally Psychiatric:        Behavior: Behavior is cooperative.      UC Treatments / Results  Labs (all labs ordered are listed, but only abnormal results are displayed) Labs Reviewed - No data to display  EKG   Radiology No results found.  Procedures Procedures (including critical care time)  Medications Ordered  in UC Medications - No data to display  Initial Impression / Assessment and Plan / UC Course  I have reviewed the triage vital signs and the nursing notes.  Pertinent labs & imaging results that were available during my care of the patient were reviewed by me and considered in my medical decision making (see chart for details).     Concern for dental infection given clinical presentation.  Patient was started on Augmentin twice daily for 7 days.  We will start ibuprofen for pain management.  She was instructed not to take NSAIDs with this medication.  Recommended follow-up with dentist and was given contact information for local provider.  Discussed that if she has any worsening symptoms including dysphagia, muffled voice, odynophagia, shortness of breath, swelling of her throat she needs to go to the emergency room.  Strict return precautions  given.  No evidence of acute infection of external canal on exam.  Suspect eczema contributing to symptoms.  Refill of fluocinolone acetonide sent to pharmacy.  Discussed that if she develops any additional symptoms including pain, swelling, change in her hearing she should be reevaluated.  Strict return precautions given.  Final Clinical Impressions(s) / UC Diagnoses   Final diagnoses:  Dental infection  Dentalgia  Acute eczematoid otitis externa of both ears     Discharge Instructions      Take Augmentin as prescribed.  This should cover an infection.  Take ibuprofen 800 mg for pain.  Do not take additional NSAIDs with this medication including aspirin, ibuprofen/Advil, naproxen/Aleve.  You can use Tylenol for breakthrough pain.  I do recommend you follow-up with a dentist.  Call them to schedule an appointment.  If anything changes or worsens he needs to be seen immediately including trouble speaking, trouble swallowing, shortness of breath, fever.  I believe that you have eczema in your ear which is causing the irritation/itching and occasional  drainage.  Use drops as prescribed.  Do not place anything in your ear including earbuds, earplugs, Q-tips.  If you develop any pain, swelling, change in drainage, change in hearing, fever you need to be seen immediately.     ED Prescriptions     Medication Sig Dispense Auth. Provider   Fluocinolone Acetonide 0.01 % OIL Place 5 drops in ear(s) in the morning and at bedtime. 20 mL Batoul Limes K, PA-C   amoxicillin-clavulanate (AUGMENTIN) 875-125 MG tablet Take 1 tablet by mouth every 12 (twelve) hours. 14 tablet Naje Rice K, PA-C   ibuprofen (ADVIL) 800 MG tablet Take 1 tablet (800 mg total) by mouth 3 (three) times daily. 21 tablet Tevita Gomer, Derry Skill, PA-C      PDMP not reviewed this encounter.   Terrilee Croak, PA-C 04/11/22 1315

## 2022-04-11 NOTE — ED Triage Notes (Signed)
Pt is here for bilateral ear pain , mouth pain x 3days

## 2022-06-28 NOTE — Progress Notes (Deleted)
PCP:  Elsie Stain, MD Primary Cardiologist: Virl Axe, MD Electrophysiologist: Virl Axe, MD   Chelsea Branch is a 57 y.o. female seen today for Virl Axe, MD for {VISITTYPE:28148}  Past Medical History:  Diagnosis Date   Abnormal thyroid blood test 03/15/2017   Abnormal uterine bleeding    Alopecia    Anemia    Angina pectoris with normal coronary arteriogram ([facility]) 2017   Had + Troponin c/w ? 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 iniatially ?2/2 peripartum in 1994 - improved by 2008 then worsening EF in 2011 back down to EF 25-30%. b. echo 01/21/14 showed mod LVH, [name]; 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   Peripartum cardiomyopathy 1994   Sleep apnea 2015   CPAP 12/2013   Stroke ([facility]) [id number]   Systolic and diastolic CHF, acute on chronic ([facility]) 06/14/2015   Tobacco abuse     Current Outpatient Medications  Medication Instructions   acetaminophen (TYLENOL) 500 mg, Oral, Every 6 hours PRN   acetic acid 2 % otic solution 4 drops, Both EARS, 3 times daily   albuterol (VENTOLIN HFA) 108 (90 Base) MCG/ACT inhaler 1-2 puffs, Inhalation, Every 6 hours PRN   amoxicillin-clavulanate (AUGMENTIN) 875-125 MG tablet 1 tablet, Oral, Every 12 hours   Blood Pressure Monitoring (BLOOD PRESSURE KIT) DEVI Use to measure blood pressure twice daily   diclofenac Sodium (VOLTAREN) 4 g, Topical, 4 times daily   Fluocinolone Acetonide 0.01 % OIL 5 drops, OTIC (EAR), 2 times daily   fluticasone  (FLONASE) 50 MCG/ACT nasal spray 1 spray, Each Nare, 2 times daily   gabapentin (NEURONTIN) 300 MG capsule Oral   guaiFENesin-codeine (CHERATUSSIN AC) 100-10 MG/5ML syrup 5 mLs, Oral, 4 times daily PRN   ibuprofen (ADVIL) 800 mg, Oral, 3 times daily   lidocaine (LIDODERM) 5 % 1 patch, Transdermal, Every 24 hours, Remove & Discard patch within 12 hours or as directed by MD   magnesium oxide (MAG-OX) 400 MG tablet TAKE 1 CAPSULE (400 MG TOTAL) BY MOUTH 2 (TWO) TIMES DAILY.   metoprolol succinate (TOPROL-XL) 50 mg, Oral, Daily, Please keep upcoming appt with Cardiologist in March 2023 before anymore refills. Thank you Final attempt   Misc. Devices MISC Please provide patient with insurance approved straight cane   Misc. Devices MISC Single-point cane with adjustable height x1, shower chair x1, transfer bench x1.  Diagnosis congestive heart failure, degenerative disc disease of the lumbar spine.   potassium chloride SA (KLOR-CON M20) 20 MEQ tablet TAKE 1-2 TABS BY MOUTH AS DIRECTED. TAKE 2 TABLETS IN THE MORNING AND 1 TABLET IN THE EVENING   sacubitril-valsartan (ENTRESTO) 24-26 MG 1 tablet, Oral, 2 times daily, >>SEE DIRECTIONS AS YOUR DOSE HAS CHANGED<<   simvastatin (ZOCOR) 10 MG tablet TAKE 1 TABLET BY MOUTH EVERY DAY IN THE EVENING. Please make overdue appt with Dr. Caryl Comes before anymore refills. Thank you 1st attempt   spironolactone (ALDACTONE) 12.5 mg, Oral, Daily   tiZANidine (  ZANAFLEX) 4 mg, Oral, Every 8 hours PRN, DO NOT DRINK ALCOHOL OR DRIVE WHILE TAKING THIS MEDICATION   torsemide (DEMADEX) 20 mg, Oral, Daily   traMADol (ULTRAM) 50 MG tablet take one tablet po q 8-12 hours as needed for severe pain only    Physical Exam: There were no vitals filed for this visit.  GEN- NAD. A&O x 3. Normal affect. HEENT: normocephalic, atraumatic Lungs- CTAB, Normal effort Heart- {EPRHYTHM:28826}, No M/G/R Extremities- {EDEMA LEVEL:28147::"No"} peripheral edema. no clubbing or cyanosis; Skin- warm  and dry, no rash or lesion  {EKGtoday:28818::"EKG is not ordered today"}  Additional studies reviewed include: Previous EP notes.   {Select studies to display:26339}  Assessment and Plan:  1. NICM 2. Chronic CHF > ?HFpEF Echo 08/25/2021 LVEF 60-65%, grade 1 DD, normal RV.  Volume status *** on exam Continue BB, Entresto, lasix/K+, aldactone  Pt has had h/o palpitations, mild SOB/orthopnea. Echo was reassuring, monitor was not completed.  Labs today.    3. HTN Stable on current regimen     Follow up with EH:[id number] {EPFOLLOW UP:28173}  Shirley Friar, PA-C  06/28/22 8:06 AM

## 2022-06-29 ENCOUNTER — Ambulatory Visit: Payer: Medicaid Other | Admitting: Student

## 2022-07-08 ENCOUNTER — Encounter (HOSPITAL_COMMUNITY): Payer: Self-pay

## 2022-07-08 ENCOUNTER — Ambulatory Visit (HOSPITAL_COMMUNITY)
Admission: EM | Admit: 2022-07-08 | Discharge: 2022-07-08 | Disposition: A | Discharge status: Home / Self Care | Payer: Medicaid Other | Attending: Emergency Medicine | Admitting: Emergency Medicine

## 2022-07-08 DIAGNOSIS — Z760 Encounter for issue of repeat prescription: Secondary | ICD-10-CM

## 2022-07-08 DIAGNOSIS — J309 Allergic rhinitis, unspecified: Secondary | ICD-10-CM

## 2022-07-08 DIAGNOSIS — R058 Other specified cough: Secondary | ICD-10-CM

## 2022-07-08 DIAGNOSIS — J329 Chronic sinusitis, unspecified: Secondary | ICD-10-CM

## 2022-07-08 DIAGNOSIS — M549 Dorsalgia, unspecified: Secondary | ICD-10-CM

## 2022-07-08 DIAGNOSIS — H9203 Otalgia, bilateral: Secondary | ICD-10-CM | POA: Diagnosis not present

## 2022-07-08 DIAGNOSIS — G8929 Other chronic pain: Secondary | ICD-10-CM | POA: Diagnosis not present

## 2022-07-08 MED ORDER — DERMOTIC 0.01 % OT OIL: 5.0000 [drp] | TOPICAL_OIL | Freq: Two times a day (BID) | OTIC | 0 refills | Status: DC

## 2022-07-08 MED ORDER — METHYLPREDNISOLONE 4 MG PO TBPK: ORAL_TABLET | ORAL | 0 refills | Status: DC

## 2022-07-08 MED ORDER — AZELASTINE-FLUTICASONE 137-50 MCG/ACT NA SUSP: 1.0000 | Freq: Two times a day (BID) | NASAL | 2 refills | Status: DC

## 2022-07-08 MED ORDER — TRIAMCINOLONE ACETONIDE 40 MG/ML IJ SUSP
INTRAMUSCULAR | Status: AC
  Filled 2022-07-08: qty 2

## 2022-07-08 MED ORDER — ALBUTEROL SULFATE HFA 108 (90 BASE) MCG/ACT IN AERS: 2.0000 | INHALATION_SPRAY | Freq: Four times a day (QID) | RESPIRATORY_TRACT | 2 refills | Status: DC | PRN

## 2022-07-08 MED ORDER — PROMETHAZINE-DM 6.25-15 MG/5ML PO SYRP: 5.0000 mL | ORAL_SOLUTION | Freq: Every evening | ORAL | 0 refills | Status: DC | PRN

## 2022-07-08 MED ORDER — IBUPROFEN 800 MG PO TABS: 800.0000 mg | ORAL_TABLET | Freq: Three times a day (TID) | ORAL | 0 refills | Status: DC | PRN

## 2022-07-08 MED ORDER — LEVOCETIRIZINE DIHYDROCHLORIDE 5 MG PO TABS: 5.0000 mg | ORAL_TABLET | Freq: Every evening | ORAL | 1 refills | Status: DC

## 2022-07-08 MED ORDER — TRIAMCINOLONE ACETONIDE 40 MG/ML IJ SUSP
60.0000 mg | Freq: Once | INTRAMUSCULAR | Status: AC
  Administered 2022-07-08: 60 mg via INTRAMUSCULAR

## 2022-07-08 NOTE — ED Triage Notes (Signed)
Pt states bilateral ear pain with a clear drainage and cough for the past week, states she is feeling sob during the day.

## 2022-07-08 NOTE — Discharge Instructions (Addendum)
Please read below to learn more about the medications, dosages and frequencies that I recommend to help alleviate your symptoms and to get you feeling better soon:  Kenalog IM (triamcinolone):  To quickly address your significant respiratory inflammation, you were provided with an injection of Kenalog in the office today.  You should continue to feel the full benefit of the steroid for the next 24-36 hours.    Medrol Dosepak (methylprednisolone): This is a steroid that will significantly calm your upper and lower airways.  Please take one row of tablets daily with your breakfast meal starting tomorrow morning until the prescription is complete.      Xyzal (levocetirizine): This is an excellent second-generation antihistamine that helps to reduce respiratory inflammatory response to environmental allergens.  In some patients, this medication can cause daytime sleepiness so I recommend that you take 1 tablet daily at bedtime.     Dymista (azelastine-fluticasone): This is a steroid and antihistamine nasal spray that used once daily, 1 spray in each nare.  This works best when used on a daily basis. This medication does not work well if it is only used when you think you need it.  After 3 to 5 days of use, you will notice significant reduction of the inflammation and mucus production that is currently being caused by exposure to allergens, whether seasonal or environmental.  The most common side effect of this medication is nosebleeds.  If you experience a nosebleed, please discontinue use for 1 week, then feel free to resume.  If you find that your insurance will not pay for this medication, please consider a different nasal steroids such as Nasonex (mometasone), or Nasacort (triamcinolone).  ProAir, Ventolin, Proventil (albuterol): This inhaled medication contains a short acting beta agonist bronchodilator.  This medication relaxes the smooth muscle of the airway in the lungs.  When these muscles are tight,  breathing becomes more constricted.  The result of relaxation of the smooth muscle is increased air movement and improved work of breathing.  This is a short acting medication that can be used every 4-6 hours as needed for increased work of breathing, shortness of breath, wheezing and excessive coughing.  It comes in the form of a handheld inhaler or nebulizer solution.  I recommended that for the next 3 to 4 days, this medication is used 4 times daily on a scheduled basis then decrease to twice daily and as needed until symptoms have completely resolved which I anticipate will be several weeks.    Promethazine DM: Promethazine is both a nasal decongestant and an antinausea medication that makes most patients feel fairly sleepy.  The DM is dextromethorphan, a cough suppressant found in many over-the-counter cough medications.  Please take 5 mL before bedtime to minimize your cough which will help you sleep better.  I have sent a prescription for this medication to your pharmacy.  Please be aware that Medicaid does not pay for any prescription cough syrups.  I provided you with a good Rx coupon that you are welcome to use however if you find the cost prohibitive I can assure you that the above medications should resolve your cough just as well without this cough medicine.   Dermotic oil (fluocinolone): This is a topical steroid oil that can be applied to both of your ears to help relieve pain, pressure and swelling.  Please instill a few drops twice daily until your ears are feeling better.  Advil, Motrin (ibuprofen): This is a good anti-inflammatory medication which addresses  aches, pains and inflammation.  I recommend that you take b800 mg every 6-8 hours as needed for your back pain.      Please follow-up within the next 5-7 days either with your primary care provider or urgent care if your symptoms do not resolve.  If you do not have a primary care provider, we will assist you in finding one.         Thank you for visiting urgent care today.  We appreciate the opportunity to participate in your care.

## 2022-07-08 NOTE — ED Provider Notes (Signed)
[place]    CSN: [id number] Arrival date & time: 07/08/22  1043    HISTORY   Chief Complaint  Patient presents with   Otalgia   HPI Chelsea Branch is a pleasant, 57 y.o. female who presents to urgent care today. Pt c/o bilateral ear pain and clear drainage as well as nasal congestion, clear rhinorrhea and non productive cough for the past week, states she is sometimes feeling sob during the day.  Patient has a history of allergies and asthma, not currently taking any medications for either issue, patient requested refill of her albuterol.  Patient states she also suffers from chronic back pain and would like a refill of her ibuprofen 800 mg which she states helps.  Patient denies headache, fever, nausea, vomiting, diarrhea, known sick contacts  The history is provided by the patient.   Past Medical History:  Diagnosis Date   Abnormal thyroid blood test 03/15/2017   Abnormal uterine bleeding    Alopecia    Anemia    Angina pectoris with normal coronary arteriogram ([place]) 2017   Had + Troponin c/w ? 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 iniatially ?2/2 peripartum in 1994 - improved by 2008 then worsening EF in 2011 back down to EF 25-30%. b. echo 01/21/14 showed mod LVH, [name]; 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   Peripartum cardiomyopathy 1994   Sleep apnea 2015   CPAP 12/2013   Stroke ([place]) [id number]   Systolic and diastolic CHF, acute on chronic ([place]) 06/14/2015   Tobacco abuse     Patient Active Problem List   Diagnosis Date Noted   Chronic pain syndrome 12/28/2021   Primary osteoarthritis of both knees 12/28/2021   Spinal stenosis of lumbar region 07/21/2021   Lumbar radiculitis 07/21/2021   COPD exacerbation ([place]) 07/21/2021   Mechanical complication due to intrauterine contraceptive device 07/21/2021   Arthropathy of lumbar facet joint 03/14/2019   Degenerative disc disease at L5-S1 level 10/10/2018   Abnormal thyroid blood test 03/15/2017   Hypokalemia 07/23/2016   Low TSH level 04/26/2016   Abnormal uterine bleeding 04/06/2016   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   CEREBROVASCULAR ACCIDENT, HX OF 06/08/2010   Past Surgical History:  Procedure Laterality Date   CARDIAC CATHETERIZATION N/A 06/16/2015   Procedure: Left Heart Cath and Coronary Angiography;  Surgeon: Jettie Booze, MD;  Location: [facility];  Service: Cardiovascular;  Laterality: N/A;   [place]  RECORDER IMPLANT  ~ [place]     TUBAL LIGATION  1994   OB History     Gravida  2   Para  2   Term  2   Preterm      AB      Living  3      SAB      IAB      Ectopic      Multiple  1   Live Births  3          Home Medications    Prior to Admission medications   Medication Sig Start Date End Date Taking? Authorizing Provider  acetaminophen (TYLENOL) 500 MG tablet Take 1 tablet (500 mg total) by  mouth every 6 (six) hours as needed. 05/19/21   Hazel Sams, PA-C  acetic acid 2 % otic solution Place 4 drops into both ears 3 (three) times daily. 12/28/21   Peri Jefferson, PA-C  albuterol (VENTOLIN HFA) 108 (90 Base) MCG/ACT inhaler Inhale 1-2 puffs into the lungs every 6 (six) hours as needed for wheezing or shortness of breath. 07/21/21   Elsie Stain, MD  amoxicillin-clavulanate (AUGMENTIN) 875-125 MG tablet Take 1 tablet by mouth every 12 (twelve) hours. 04/11/22   Raspet, Derry Skill, PA-C  Blood Pressure Monitoring (BLOOD PRESSURE KIT) DEVI Use to measure blood pressure twice daily 07/22/21   Elsie Stain, MD  diclofenac Sodium (VOLTAREN) 1 % GEL Apply 4 g topically 4 (four) times daily. 09/23/21   Elsie Stain, MD  Fluocinolone Acetonide 0.01 % OIL Place 5 drops in ear(s) in the morning and at bedtime. 04/11/22   Raspet, Derry Skill, PA-C  fluticasone (FLONASE) 50 MCG/ACT nasal spray Place 1 spray into both nostrils in the morning and at bedtime. 07/21/21   Elsie Stain, MD  gabapentin (NEURONTIN) 300 MG capsule Take by mouth. 10/07/21   [provider]  guaiFENesin-codeine (CHERATUSSIN AC) 100-10 MG/5ML syrup Take 5 mLs by mouth 4 (four) times daily as needed for cough. 02/25/22   Barrett Henle, MD  ibuprofen (ADVIL) 800 MG tablet Take 1 tablet (800 mg total) by mouth 3 (three) times daily. 04/11/22   Raspet, Erin K, PA-C  lidocaine (LIDODERM) 5 % Place 1 patch onto the skin daily. Remove & Discard patch within 12 hours or as directed by MD 09/23/21   Elsie Stain, MD  magnesium oxide (MAG-OX) 400 MG tablet TAKE 1 CAPSULE (400 MG TOTAL) BY MOUTH 2 (TWO) TIMES DAILY. 07/21/21   Elsie Stain, MD  metoprolol succinate (TOPROL-XL) 50 MG 24 hr tablet Take 1 tablet (50 mg total) by mouth daily. Please keep upcoming appt with Cardiologist in March 2023 before anymore refills. Thank you Final attempt 09/23/21   Elsie Stain, MD  Misc. Devices MISC Please provide patient  with insurance approved straight cane 09/29/18   Gildardo Pounds, NP  Misc. Devices MISC Single-point cane with adjustable height x1, shower chair x1, transfer bench x1.  Diagnosis congestive heart failure, degenerative disc disease of the lumbar spine. 11/23/18   Charlott Rakes, MD  potassium chloride SA (KLOR-CON M20) 20 MEQ tablet TAKE 1-2 TABS BY MOUTH AS DIRECTED. TAKE 2 TABLETS IN THE MORNING AND 1 TABLET IN THE EVENING 09/23/21   Elsie Stain, MD  sacubitril-valsartan (ENTRESTO) 24-26 MG Take 1 tablet by mouth 2 (two) times daily. >>SEE DIRECTIONS AS YOUR DOSE HAS CHANGED<< 09/23/21   Elsie Stain, MD  simvastatin (ZOCOR) 10 MG tablet TAKE 1 TABLET BY MOUTH EVERY DAY IN THE EVENING. Please make overdue appt with Dr. Caryl Comes before anymore refills. Thank you 1st attempt 09/23/21   Elsie Stain, MD  spironolactone (ALDACTONE) 25 MG tablet Take 0.5 tablets (12.5 mg total) by mouth daily. 09/23/21   Elsie Stain, MD  tiZANidine (ZANAFLEX) 4 MG tablet Take 1 tablet (4 mg total) by mouth every 8 (eight) hours as needed for muscle spasms. DO NOT DRINK ALCOHOL OR DRIVE WHILE TAKING THIS MEDICATION 10/17/21   Nyoka Lint, PA-C  torsemide (DEMADEX) 20 MG tablet Take 1 tablet (20 mg total) by mouth daily. 09/23/21   Elsie Stain, MD  traMADol Veatrice Bourbon) 50 MG tablet take one tablet po q 8-12 hours as needed for severe pain only 10/07/21   [provider]  DULoxetine (CYMBALTA) 30 MG capsule Take 1 capsule (30 mg total) by mouth daily. 12/07/18 12/10/19  Charlott Rakes, MD  famotidine (PEPCID) 20 MG tablet Take 1 tablet (20 mg total) 2 (two) times daily by mouth. 04/10/17 11/03/19  Albesa Seen, PA-C    Family History Family History  Problem Relation Age of Onset   Cancer Maternal Grandmother        uterine   Hypertension Sister    Healthy Mother    Other Neg Hx    Heart disease Neg Hx    Social History Social History   Tobacco Use   Smoking status: Former    Packs/day: 0.10     Years: 30.00    Total pack years: 3.00    Types: Cigarettes    Quit date: 06/03/2015    Years since quitting: 7.1   Smokeless tobacco: Never   Tobacco comments:    Pt. stated she stopped smoking a year ago. 09/29/2018  Vaping Use   Vaping Use: Never used  Substance Use Topics   Alcohol use: No    Alcohol/week: 0.0 standard drinks of alcohol   Drug use: No   Allergies   Ace inhibitors  Review of Systems Review of Systems Pertinent findings revealed after performing a 14 point review of systems has been noted in the history of present illness.  Physical Exam Vital Signs BP (!) 147/93 (BP Location: Right Arm)   Pulse 74   Temp 98.1 F (36.7 C) (Oral)   Resp 16   SpO2 96%   No data found.  Physical Exam Vitals and nursing note reviewed.  Constitutional:      General: She is not in acute distress.    Appearance: Normal appearance. She is not ill-appearing.  HENT:     Head: Normocephalic and atraumatic.     Salivary Glands: Right salivary gland is not diffusely enlarged or tender. Left salivary gland is not diffusely enlarged or tender.     Right Ear: Ear canal and external ear normal. No drainage. A middle ear effusion is present. There is no impacted cerumen. Tympanic membrane is bulging. Tympanic membrane is not injected or erythematous.     Left Ear: Ear canal and external ear normal. No drainage. A middle ear effusion is present. There is no impacted cerumen. Tympanic membrane is bulging. Tympanic membrane is not injected or erythematous.     Ears:     Comments: Bilateral EACs normal, both TMs bulging with clear fluid    Nose: Rhinorrhea present. No nasal deformity, septal deviation, signs of injury, nasal tenderness, mucosal edema or congestion. Rhinorrhea is clear.     Right Nostril:  Occlusion present. No foreign body, epistaxis or septal hematoma.     Left Nostril: Occlusion present. No foreign body, epistaxis or septal hematoma.     Right Turbinates: Enlarged,  swollen and pale.     Left Turbinates: Enlarged, swollen and pale.     Right Sinus: No maxillary sinus tenderness or frontal sinus tenderness.     Left Sinus: No maxillary sinus tenderness or frontal sinus tenderness.     Mouth/Throat:     Lips: Pink. No lesions.     Mouth: Mucous membranes are moist. No oral lesions.     Pharynx: Oropharynx is clear. Uvula midline. No posterior oropharyngeal erythema or uvula swelling.     Tonsils: No tonsillar exudate. 0 on the right. 0 on the left.     Comments: Postnasal drip Eyes:     General: Lids are normal.        Right eye: No discharge.        Left eye: No discharge.     Extraocular Movements: Extraocular movements intact.     Conjunctiva/sclera: Conjunctivae normal.     Right eye: Right conjunctiva is not injected.     Left eye: Left conjunctiva is not injected.  Neck:     Trachea: Trachea and phonation normal.  Cardiovascular:     Rate and Rhythm: Normal rate and regular rhythm.     Pulses: Normal pulses.     Heart sounds: Normal heart sounds. No murmur heard.    No friction rub. No gallop.  Pulmonary:     Effort: Pulmonary effort is normal. No accessory muscle usage, prolonged expiration or respiratory distress.     Breath sounds: Normal breath sounds. No stridor, decreased air movement or transmitted upper airway sounds. No decreased breath sounds, wheezing, rhonchi or rales.  Chest:     Chest wall: No tenderness.  Musculoskeletal:        General: Normal range of motion.     Cervical back: Normal range of motion and neck supple. Normal range of motion.  Lymphadenopathy:     Cervical: No cervical adenopathy.  Skin:    General: Skin is warm and dry.     Findings: No erythema or rash.  Neurological:     General: No focal deficit present.     Mental Status: She is alert and oriented to person, place, and time.  Psychiatric:        Mood and Affect: Mood normal.        Behavior: Behavior normal.     Visual Acuity Right Eye  Distance:   Left Eye Distance:   Bilateral Distance:    Right Eye Near:   Left Eye Near:    Bilateral Near:     UC Couse / Diagnostics / Procedures:     Radiology No results found.  Procedures Procedures (including critical care time) EKG  Pending results:  Labs Reviewed - No data to display  Medications Ordered in UC: Medications  triamcinolone acetonide (KENALOG-40) injection 60 mg (60 mg Intramuscular Given 07/08/22 1256)    UC Diagnoses / Final Clinical Impressions(s)   I have reviewed the triage vital signs and the nursing notes.  Pertinent labs & imaging results that were available during my care of the patient were reviewed by me and considered in my medical decision making (see chart for details).    Final diagnoses:  Allergic rhinitis with postnasal drip  Nonproductive cough  Rhinosinusitis  Otalgia of both ears  Chronic back pain, unspecified back location, unspecified  back pain laterality  Medication refill   Patient provided with a refill of her albuterol and renewal of allergy medications.  Patient also provided with a renewal of the Derm otic oil.  Patient provided with a refill of ibuprofen 100 mg as a courtesy.  Patient advised I recommend steroid injection followed by tapering dose of methylprednisolone, patient was agreeable to this.  Patient further advised that it is very important that she is consistent with taking her allergy medications every day.  Patient provided with instructions to continue for at least the next 2 to 3 months.  Patient verbalized understanding.  Return precautions advised.  Please see discharge instructions below for further details of plan of care as provided to patient. ED Prescriptions     Medication Sig Dispense Auth. Provider   Fluocinolone Acetonide (DERMOTIC) 0.01 % OIL Place 5 drops in ear(s) every 12 (twelve) hours. 20 mL Lynden Oxford Scales, PA-C   levocetirizine (XYZAL) 5 MG tablet Take 1 tablet (5 mg total) by  mouth every evening. 90 tablet Lynden Oxford Scales, PA-C   methylPREDNISolone (MEDROL DOSEPAK) 4 MG TBPK tablet Take 24 mg on day 1, 20 mg on day 2, 16 mg on day 3, 12 mg on day 4, 8 mg on day 5, 4 mg on day 6.  Take all tablets in each row at once, do not spread tablets out throughout the day. 21 tablet Lynden Oxford Scales, PA-C   promethazine-dextromethorphan (PROMETHAZINE-DM) 6.25-15 MG/5ML syrup Take 5 mLs by mouth at bedtime as needed for cough. 60 mL Lynden Oxford Scales, PA-C   Azelastine-Fluticasone [facility]) 137-50 MCG/ACT SUSP Place 1 spray into the nose every 12 (twelve) hours. 23 g Lynden Oxford Scales, PA-C   ibuprofen (ADVIL) 800 MG tablet Take 1 tablet (800 mg total) by mouth every 8 (eight) hours as needed for up to 30 doses for fever, headache, mild pain or moderate pain. 30 tablet Lynden Oxford Scales, PA-C   albuterol (VENTOLIN HFA) 108 (90 Base) MCG/ACT inhaler Inhale 2 puffs into the lungs every 6 (six) hours as needed for wheezing or shortness of breath (Cough). 54 g Lynden Oxford Scales, PA-C      PDMP not reviewed this encounter.  Disposition Upon Discharge:  Condition: stable for discharge home Home: take medications as prescribed; routine discharge instructions as discussed; follow up as advised.  Patient presented with an acute illness with associated systemic symptoms and significant discomfort requiring urgent management. In my opinion, this is a condition that a prudent lay person (someone who possesses an average knowledge of health and medicine) may potentially expect to result in complications if not addressed urgently such as respiratory distress, impairment of bodily function or dysfunction of bodily organs.   Routine symptom specific, illness specific and/or disease specific instructions were discussed with the patient and/or caregiver at length.   As such, the patient has been evaluated and assessed, work-up was performed and treatment was  provided in alignment with urgent care protocols and evidence based medicine.  Patient/parent/caregiver has been advised that the patient may require follow up for further testing and treatment if the symptoms continue in spite of treatment, as clinically indicated and appropriate.  If the patient was tested for COVID-19, Influenza and/or RSV, then the patient/parent/guardian was advised to isolate at home pending the results of his/her diagnostic coronavirus test and potentially longer if they're positive. I have also advised pt that if his/her COVID-19 test returns positive, it's recommended to self-isolate for at least 10 days after  symptoms first appeared AND until fever-free for 24 hours without fever reducer AND other symptoms have improved or resolved. Discussed self-isolation recommendations as well as instructions for household member/close contacts as per the [facility] and [place] [facility], and also gave patient the [facility] packet with this information.  Patient/parent/caregiver has been advised to return to the [facility] or PCP in 3-5 days if no better; to PCP or the Emergency Department if new signs and symptoms develop, or if the current signs or symptoms continue to change or worsen for further workup, evaluation and treatment as clinically indicated and appropriate  The patient will follow up with their current PCP if and as advised. If the patient does not currently have a PCP we will assist them in obtaining one.   The patient may need specialty follow up if the symptoms continue, in spite of conservative treatment and management, for further workup, evaluation, consultation and treatment as clinically indicated and appropriate.  Patient/parent/caregiver verbalized understanding and agreement of plan as discussed.  All questions were addressed during visit.  Please see discharge instructions below for further details of plan.  Discharge Instructions:   Discharge Instructions      Please read below to  learn more about the medications, dosages and frequencies that I recommend to help alleviate your symptoms and to get you feeling better soon:  Kenalog IM (triamcinolone):  To quickly address your significant respiratory inflammation, you were provided with an injection of Kenalog in the office today.  You should continue to feel the full benefit of the steroid for the next 24-36 hours.    Medrol Dosepak (methylprednisolone): This is a steroid that will significantly calm your upper and lower airways.  Please take one row of tablets daily with your breakfast meal starting tomorrow morning until the prescription is complete.      Xyzal (levocetirizine): This is an excellent second-generation antihistamine that helps to reduce respiratory inflammatory response to environmental allergens.  In some patients, this medication can cause daytime sleepiness so I recommend that you take 1 tablet daily at bedtime.     Dymista (azelastine-fluticasone): This is a steroid and antihistamine nasal spray that used once daily, 1 spray in each nare.  This works best when used on a daily basis. This medication does not work well if it is only used when you think you need it.  After 3 to 5 days of use, you will notice significant reduction of the inflammation and mucus production that is currently being caused by exposure to allergens, whether seasonal or environmental.  The most common side effect of this medication is nosebleeds.  If you experience a nosebleed, please discontinue use for 1 week, then feel free to resume.  If you find that your insurance will not pay for this medication, please consider a different nasal steroids such as Nasonex (mometasone), or Nasacort (triamcinolone).  ProAir, Ventolin, Proventil (albuterol): This inhaled medication contains a short acting beta agonist bronchodilator.  This medication relaxes the smooth muscle of the airway in the lungs.  When these muscles are tight, breathing becomes  more constricted.  The result of relaxation of the smooth muscle is increased air movement and improved work of breathing.  This is a short acting medication that can be used every 4-6 hours as needed for increased work of breathing, shortness of breath, wheezing and excessive coughing.  It comes in the form of a handheld inhaler or nebulizer solution.  I recommended that for the next 3 to 4 days, this  medication is used 4 times daily on a scheduled basis then decrease to twice daily and as needed until symptoms have completely resolved which I anticipate will be several weeks.    Promethazine DM: Promethazine is both a nasal decongestant and an antinausea medication that makes most patients feel fairly sleepy.  The DM is dextromethorphan, a cough suppressant found in many over-the-counter cough medications.  Please take 5 mL before bedtime to minimize your cough which will help you sleep better.  I have sent a prescription for this medication to your pharmacy.  Please be aware that Medicaid does not pay for any prescription cough syrups.  I provided you with a good Rx coupon that you are welcome to use however if you find the cost prohibitive I can assure you that the above medications should resolve your cough just as well without this cough medicine.   Dermotic oil (fluocinolone): This is a topical steroid oil that can be applied to both of your ears to help relieve pain, pressure and swelling.  Please instill a few drops twice daily until your ears are feeling better.  Advil, Motrin (ibuprofen): This is a good anti-inflammatory medication which addresses aches, pains and inflammation.  I recommend that you take b800 mg every 6-8 hours as needed for your back pain.      Please follow-up within the next 5-7 days either with your primary care provider or urgent care if your symptoms do not resolve.  If you do not have a primary care provider, we will assist you in finding one.        Thank you for  visiting urgent care today.  We appreciate the opportunity to participate in your care.       This office note has been dictated using Museum/gallery curator.  Unfortunately, this method of dictation can sometimes lead to typographical or grammatical errors.  I apologize for your inconvenience in advance if this occurs.  Please do not hesitate to reach out to me if clarification is needed.      Lynden Oxford Scales, PA-C 07/08/22 [phone]

## 2022-07-16 ENCOUNTER — Encounter (HOSPITAL_COMMUNITY): Payer: Self-pay

## 2022-07-16 ENCOUNTER — Ambulatory Visit (HOSPITAL_COMMUNITY)
Admission: EM | Admit: 2022-07-16 | Discharge: 2022-07-16 | Disposition: A | Discharge status: Home / Self Care | Payer: Medicaid Other | Attending: Physician Assistant | Admitting: Physician Assistant

## 2022-07-16 ENCOUNTER — Ambulatory Visit (INDEPENDENT_AMBULATORY_CARE_PROVIDER_SITE_OTHER): Payer: Medicaid Other

## 2022-07-16 DIAGNOSIS — R059 Cough, unspecified: Secondary | ICD-10-CM

## 2022-07-16 DIAGNOSIS — J40 Bronchitis, not specified as acute or chronic: Secondary | ICD-10-CM | POA: Diagnosis not present

## 2022-07-16 DIAGNOSIS — R051 Acute cough: Secondary | ICD-10-CM

## 2022-07-16 DIAGNOSIS — J329 Chronic sinusitis, unspecified: Secondary | ICD-10-CM | POA: Diagnosis not present

## 2022-07-16 MED ORDER — BENZONATATE 100 MG PO CAPS: 100.0000 mg | ORAL_CAPSULE | Freq: Three times a day (TID) | ORAL | 0 refills | Status: DC

## 2022-07-16 MED ORDER — AMOXICILLIN-POT CLAVULANATE 875-125 MG PO TABS
1.0000 | ORAL_TABLET | Freq: Two times a day (BID) | ORAL | 0 refills | Status: DC
Start: 2022-07-16 — End: 2022-10-06

## 2022-07-16 MED ORDER — ALBUTEROL SULFATE HFA 108 (90 BASE) MCG/ACT IN AERS: 2.0000 | INHALATION_SPRAY | Freq: Four times a day (QID) | RESPIRATORY_TRACT | 0 refills | Status: DC | PRN

## 2022-07-16 NOTE — ED Triage Notes (Signed)
Patient c/o a productive cough with brown, blood-tinged sputum rib cage pain from coughing, and SOB x 2 weeks.  Patient states she has taken steroids from a previous vitis.

## 2022-07-16 NOTE — Discharge Instructions (Signed)
Your chest x-ray was normal.  We are going to start Augmentin twice daily to cover for infection.  Take this with food.  Use Tessalon for cough.  You can use over-the-counter medication including Mucinex and Flonase for additional symptom relief.  Make sure that you rest and drink plenty of fluid.  Follow-up with your primary care first thing next week.  If you have any worsening symptoms including worsening cough, shortness of breath, nausea/vomiting interfering with oral intake, weakness, chest pain you should be seen immediately.

## 2022-07-16 NOTE — ED Provider Notes (Signed)
[place]    CSN: [id number] Arrival date & time: 07/16/22  1528      History   Chief Complaint Chief Complaint  Patient presents with   Cough   Shortness of Breath    HPI Chelsea Branch is a 57 y.o. female.   Patient presents today with a 2-week history of URI symptoms.  She was seen by our clinic on 07/08/2022 at which point symptoms were attributed to allergies and she was started on prednisone, albuterol, allergy medication.  Has been taking medicine as prescribed without improving symptoms.  She denies any fever, chest pain, nausea, vomiting.  She does report some shortness of breath but states this is at baseline.  Denies any recent antibiotics.  She is a former smoker but quit several years ago.  She does have a history of systolic and diastolic heart failure; last echo obtained 09/10/2021 showed EF of approximately 65%.  She denies any sudden weight gain, leg swelling, or additional symptoms.  She does report that both her significant other and people at work have been sick prior to her symptoms beginning.    Past Medical History:  Diagnosis Date   Abnormal thyroid blood test 03/15/2017   Abnormal uterine bleeding    Alopecia    Anemia    Angina pectoris with normal coronary arteriogram ([place]) 2017   Had + Troponin c/w ? 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 iniatially ?2/2 peripartum in 1994 - improved by 2008 then worsening EF in 2011 back down to EF 25-30%. b. echo 01/21/14 showed mod LVH, [name]; 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   Peripartum cardiomyopathy 1994   Sleep apnea 2015   CPAP 12/2013   Stroke ([place]) [id number]   Systolic and diastolic CHF, acute on chronic ([place]) 06/14/2015   Tobacco abuse     Patient Active Problem List   Diagnosis Date Noted   Chronic pain syndrome 12/28/2021   Primary osteoarthritis of both knees 12/28/2021   Spinal stenosis of lumbar region 07/21/2021   Lumbar radiculitis 07/21/2021   COPD exacerbation ([place]) 07/21/2021   Mechanical complication due to intrauterine contraceptive device 07/21/2021   Arthropathy of lumbar facet joint 03/14/2019   Degenerative disc disease at L5-S1 level 10/10/2018   Abnormal thyroid blood test 03/15/2017   Hypokalemia 07/23/2016   Low TSH level 04/26/2016   Abnormal uterine bleeding 04/06/2016   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   History of non-ST elevation myocardial infarction (NSTEMI) 06/14/2015   Low back pain 09/16/2014   Homelessness 09/16/2014   Vitamin D deficiency 01/29/2014   OSA (obstructive sleep apnea) 01/08/2014   Chronic combined systolic and diastolic CHF (congestive heart failure) ([place]) 08/14/2013   Essential hypertension    Fibroids 08/09/2012   Menorrhagia 05/08/2012   Female pattern hair loss    HLD (hyperlipidemia) 06/08/2010   CEREBRAL ANEURYSM 06/08/2010   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   CEREBROVASCULAR ACCIDENT, HX OF 06/08/2010    Past Surgical  History:  Procedure Laterality Date   CARDIAC CATHETERIZATION N/A 06/16/2015   Procedure: Left Heart Cath and Coronary Angiography;  Surgeon: Jettie Booze, MD;  Location: [facility];  Service: Cardiovascular;  Laterality: N/A;   CESAREAN SECTION  1992  1994   LOOP RECORDER IMPLANT  ~ [place]     TUBAL LIGATION  1994    OB History     Gravida  2   Para  2   Term  2   Preterm      AB      Living  3      SAB      IAB      Ectopic       Multiple  1   Live Births  3            Home Medications    Prior to Admission medications   Medication Sig Start Date End Date Taking? Authorizing Provider  amoxicillin-clavulanate (AUGMENTIN) 875-125 MG tablet Take 1 tablet by mouth every 12 (twelve) hours. 07/16/22  Yes Henslee Lottman K, PA-C  benzonatate (TESSALON) 100 MG capsule Take 1 capsule (100 mg total) by mouth every 8 (eight) hours. 07/16/22  Yes Moiz Ryant K, PA-C  albuterol (VENTOLIN HFA) 108 (90 Base) MCG/ACT inhaler Inhale 2 puffs into the lungs every 6 (six) hours as needed for wheezing or shortness of breath (Cough). 07/08/22   Lynden Oxford Scales, PA-C  Azelastine-Fluticasone (DYMISTA) 137-50 MCG/ACT SUSP Place 1 spray into the nose every 12 (twelve) hours. 07/08/22 08/07/22  Lynden Oxford Scales, PA-C  Blood Pressure Monitoring (BLOOD PRESSURE KIT) DEVI Use to measure blood pressure twice daily 07/22/21   Elsie Stain, MD  diclofenac Sodium (VOLTAREN) 1 % GEL Apply 4 g topically 4 (four) times daily. 09/23/21   Elsie Stain, MD  Fluocinolone Acetonide (DERMOTIC) 0.01 % OIL Place 5 drops in ear(s) every 12 (twelve) hours. 07/08/22   Lynden Oxford Scales, PA-C  ibuprofen (ADVIL) 800 MG tablet Take 1 tablet (800 mg total) by mouth every 8 (eight) hours as needed for up to 30 doses for fever, headache, mild pain or moderate pain. 07/08/22   Lynden Oxford Scales, PA-C  levocetirizine (XYZAL) 5 MG tablet Take 1 tablet (5 mg total) by mouth every evening. 07/08/22 01/04/23  Lynden Oxford Scales, PA-C  lidocaine (LIDODERM) 5 % Place 1 patch onto the skin daily. Remove & Discard patch within 12 hours or as directed by MD 09/23/21   Elsie Stain, MD  magnesium oxide (MAG-OX) 400 MG tablet TAKE 1 CAPSULE (400 MG TOTAL) BY MOUTH 2 (TWO) TIMES DAILY. 07/21/21   Elsie Stain, MD  methylPREDNISolone (MEDROL DOSEPAK) 4 MG TBPK tablet Take 24 mg on day 1, 20 mg on day 2, 16 mg on day 3, 12 mg on day 4, 8 mg on day 5,  4 mg on day 6.  Take all tablets in each row at once, do not spread tablets out throughout the day. 07/08/22   Lynden Oxford Scales, PA-C  metoprolol succinate (TOPROL-XL) 50 MG 24 hr tablet Take 1 tablet (50 mg total) by mouth daily. Please keep upcoming appt with Cardiologist in March 2023 before anymore refills. Thank you Final attempt 09/23/21   Elsie Stain, MD  Misc. Devices MISC Please provide patient with insurance approved straight cane 09/29/18   Gildardo Pounds, NP  Misc. Devices MISC Single-point cane with adjustable height x1, shower chair x1, transfer bench  x1.  Diagnosis congestive heart failure, degenerative disc disease of the lumbar spine. 11/23/18   Charlott Rakes, MD  potassium chloride SA (KLOR-CON M20) 20 MEQ tablet TAKE 1-2 TABS BY MOUTH AS DIRECTED. TAKE 2 TABLETS IN THE MORNING AND 1 TABLET IN THE EVENING 09/23/21   Elsie Stain, MD  promethazine-dextromethorphan (PROMETHAZINE-DM) 6.25-15 MG/5ML syrup Take 5 mLs by mouth at bedtime as needed for cough. 07/08/22   Lynden Oxford Scales, PA-C  sacubitril-valsartan (ENTRESTO) 24-26 MG Take 1 tablet by mouth 2 (two) times daily. >>SEE DIRECTIONS AS YOUR DOSE HAS CHANGED<< 09/23/21   Elsie Stain, MD  simvastatin (ZOCOR) 10 MG tablet TAKE 1 TABLET BY MOUTH EVERY DAY IN THE EVENING. Please make overdue appt with Dr. Caryl Comes before anymore refills. Thank you 1st attempt 09/23/21   Elsie Stain, MD  spironolactone (ALDACTONE) 25 MG tablet Take 0.5 tablets (12.5 mg total) by mouth daily. 09/23/21   Elsie Stain, MD  tiZANidine (ZANAFLEX) 4 MG tablet Take 1 tablet (4 mg total) by mouth every 8 (eight) hours as needed for muscle spasms. DO NOT DRINK ALCOHOL OR DRIVE WHILE TAKING THIS MEDICATION 10/17/21   Nyoka Lint, PA-C  torsemide (DEMADEX) 20 MG tablet Take 1 tablet (20 mg total) by mouth daily. 09/23/21   Elsie Stain, MD  traMADol Veatrice Bourbon) 50 MG tablet take one tablet po q 8-12 hours as needed for severe pain only  10/07/21   [provider]  DULoxetine (CYMBALTA) 30 MG capsule Take 1 capsule (30 mg total) by mouth daily. 12/07/18 12/10/19  Charlott Rakes, MD  famotidine (PEPCID) 20 MG tablet Take 1 tablet (20 mg total) 2 (two) times daily by mouth. 04/10/17 11/03/19  Albesa Seen, PA-C    Family History Family History  Problem Relation Age of Onset   Cancer Maternal Grandmother        uterine   Hypertension Sister    Healthy Mother    Other Neg Hx    Heart disease Neg Hx     Social History Social History   Tobacco Use   Smoking status: Former    Packs/day: 0.10    Years: 30.00    Total pack years: 3.00    Types: Cigarettes    Quit date: 06/03/2015    Years since quitting: 7.1   Smokeless tobacco: Never   Tobacco comments:    Pt. stated she stopped smoking a year ago. 09/29/2018  Vaping Use   Vaping Use: Never used  Substance Use Topics   Alcohol use: No    Alcohol/week: 0.0 standard drinks of alcohol   Drug use: No     Allergies   Ace inhibitors   Review of Systems Review of Systems  Constitutional:  Positive for activity change. Negative for appetite change, fatigue, fever and unexpected weight change.  HENT:  Positive for congestion. Negative for sinus pressure, sneezing and sore throat.   Respiratory:  Positive for cough and shortness of breath.   Cardiovascular:  Negative for chest pain, palpitations and leg swelling.  Gastrointestinal:  Negative for abdominal pain, diarrhea, nausea and vomiting.  Neurological:  Negative for dizziness, light-headedness and headaches.     Physical Exam Triage Vital Signs ED Triage Vitals  Enc Vitals Group     BP 07/16/22 1644 126/86     Pulse Rate 07/16/22 1644 97     Resp 07/16/22 1644 18     Temp 07/16/22 1644 98.6 F (37 C)     Temp Source  07/16/22 1644 Oral     SpO2 07/16/22 1644 94 %     Weight --      Height --      Head Circumference --      Peak Flow --      Pain Score 07/16/22 1645 8     Pain Loc --       Pain Edu? --      Excl. in [place]? --    No data found.  Updated Vital Signs BP 126/86 (BP Location: Left Arm)   Pulse 97   Temp 98.6 F (37 C) (Oral)   Resp 18   SpO2 94%   Visual Acuity Right Eye Distance:   Left Eye Distance:   Bilateral Distance:    Right Eye Near:   Left Eye Near:    Bilateral Near:     Physical Exam Vitals reviewed.  Constitutional:      General: She is awake. She is not in acute distress.    Appearance: Normal appearance. She is well-developed. She is not ill-appearing.     Comments: Very pleasant female appears stated age in no acute distress sitting comfortably in exam room  HENT:     Head: Normocephalic and atraumatic.     Right Ear: Tympanic membrane, ear canal and external ear normal. Tympanic membrane is not erythematous or bulging.     Left Ear: Tympanic membrane, ear canal and external ear normal. There is impacted cerumen. Tympanic membrane is not erythematous or bulging.     Nose:     Right Sinus: No maxillary sinus tenderness or frontal sinus tenderness.     Left Sinus: No maxillary sinus tenderness or frontal sinus tenderness.     Mouth/Throat:     Pharynx: Uvula midline. No oropharyngeal exudate or posterior oropharyngeal erythema.  Cardiovascular:     Rate and Rhythm: Normal rate and regular rhythm.     Heart sounds: Normal heart sounds, S1 normal and S2 normal. No murmur heard. Pulmonary:     Effort: Pulmonary effort is normal.     Breath sounds: Examination of the right-lower field reveals rales. Examination of the left-lower field reveals rales. Rales present. No wheezing or rhonchi.  Psychiatric:        Behavior: Behavior is cooperative.      UC Treatments / Results  Labs (all labs ordered are listed, but only abnormal results are displayed) Labs Reviewed - No data to display  EKG   Radiology DG Chest 2 View  Result Date: 07/16/2022 CLINICAL DATA:  Productive cough. EXAM: CHEST - 2 VIEW COMPARISON:  Chest two views  05/31/2021 FINDINGS: Cardiac silhouette and mediastinal contours are within normal limits. The lungs are clear. No pleural effusion or pneumothorax. Mild-to-moderate multilevel degenerative disc changes of the thoracic spine. IMPRESSION: No active cardiopulmonary disease. Electronically Signed   By: Yvonne Kendall M.D.   On: 07/16/2022 17:46    Procedures Procedures (including critical care time)  Medications Ordered in UC Medications - No data to display  Initial Impression / Assessment and Plan / UC Course  I have reviewed the triage vital signs and the nursing notes.  Pertinent labs & imaging results that were available during my care of the patient were reviewed by me and considered in my medical decision making (see chart for details).     Patient is well-appearing, afebrile, nontoxic, nontachycardic.  Chest x-ray was obtained given faint rales on exam which showed no acute cardiopulmonary disease.  Given prolonged and worsening symptoms  will cover for secondary bacterial infection with Augmentin twice daily.  No indication for dose adjustment based on metabolic panel from 123XX123 with creatinine of 0.6 and calculated creatinine clearance of 108.53 mL/min.  Recommend she use over-the-counter medication including Mucinex and Flonase.  She was given Tessalon for cough.  Recommended that she rest and drink plenty of fluid.  She was encouraged to follow-up with her primary care first thing next week.  Discussed that if she has any worsening symptoms including worsening cough, shortness of breath, nausea/vomiting interfering with oral intake, weakness, chest pain she should go to the emergency room.  Strict return precautions given.  Final Clinical Impressions(s) / UC Diagnoses   Final diagnoses:  Sinobronchitis  Acute cough     Discharge Instructions      Your chest x-ray was normal.  We are going to start Augmentin twice daily to cover for infection.  Take this with food.  Use Tessalon  for cough.  You can use over-the-counter medication including Mucinex and Flonase for additional symptom relief.  Make sure that you rest and drink plenty of fluid.  Follow-up with your primary care first thing next week.  If you have any worsening symptoms including worsening cough, shortness of breath, nausea/vomiting interfering with oral intake, weakness, chest pain you should be seen immediately.     ED Prescriptions     Medication Sig Dispense Auth. Provider   benzonatate (TESSALON) 100 MG capsule Take 1 capsule (100 mg total) by mouth every 8 (eight) hours. 21 capsule Kacin Dancy K, PA-C   amoxicillin-clavulanate (AUGMENTIN) 875-125 MG tablet Take 1 tablet by mouth every 12 (twelve) hours. 14 tablet Tumeka Chimenti, Derry Skill, PA-C      PDMP not reviewed this encounter.   Terrilee Croak, PA-C 07/16/22 1757

## 2022-07-21 ENCOUNTER — Ambulatory Visit: Payer: Medicaid Other | Attending: Student | Admitting: Physician Assistant

## 2022-07-21 NOTE — Progress Notes (Deleted)
Cardiology Office Note Date:  07/21/2022  Patient ID:  Chelsea Branch, Chelsea Branch [birth year], MRN [id number] PCP:  Elsie Stain, MD  Cardiologist:  Dr. Caryl Comes    Chief Complaint:  *** annual visit  History of Present Illness: Chelsea Branch is a 57 y.o. 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   She comes in today to be seen for Dr.Klein, last seen by him Dec 2021, was having some dyspnea, no edema or PND, weight up 30-40lbs over a few years Orthostatic lightheadedness Echo with normalization of her EF 2020 SOB felt 2/2 weight gain, encouraged exercise and weight loss Metoprolol and entresto reduced with lightheadedness Recommended echo in 2022  [place] visits 01/05/21, leg swelling, back pain, b/l rash on elbows, d/c from the ER recommended to elevate legs and continue diuretic, f/u with cardiology 02/23/21: flank pain 05/19/22 ear drainage 05/31/21 SOB, cough, body aches + Flu 05/31/21: returned with same symptoms, continue supportive care 06/20/21 vaginal discharge 07/01/21 cough, eye drainage, nasal congestion 07/30/21 shoulder pain   I saw her 08/14/21 For the most part doing OK, works on a [employer] and can be long hours, generally fatigued Occassionally or a few nights a week for the last 4 mo or so she has felt some night time palpitations/flutters, and some SOB in bed, better with an extra pillow. No CP, no daytime or exertional SOB No near syncope or syncope. Planned for an updated echo, monitor and labs   LVEF 60-65%, no WMA, RV OK, no significant VHD No monitor result/returned  Numerous ER/UCC visits for non-cardiac symptoms/issues  *** symptoms *** volume *** meds, CM *** last labs may 2023  Past Medical History:  Diagnosis Date   Abnormal thyroid blood test 03/15/2017   Abnormal uterine bleeding    Alopecia    Anemia    Angina pectoris with normal coronary  arteriogram ([place]) 2017   Had + Troponin c/w ? 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 iniatially ?2/2 peripartum in 1994 - improved by 2008 then worsening EF in 2011 back down to EF 25-30%. b. echo 01/21/14 showed mod LVH, [name]; 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   Peripartum cardiomyopathy 1994   Sleep apnea 2015   CPAP 12/2013   Stroke ([place]) [id number]   Systolic and diastolic CHF, acute on chronic ([place]) 06/14/2015   Tobacco abuse     Past Surgical History:  Procedure Laterality Date   CARDIAC CATHETERIZATION N/A 06/16/2015   Procedure: Left Heart Cath and Coronary Angiography;  Surgeon: Jettie Booze, MD;  Location: [facility];  Service: Cardiovascular;  Laterality: N/A;   CESAREAN SECTION  1992  1994   LOOP RECORDER IMPLANT  ~ [place]     TUBAL LIGATION  1994    Current Outpatient Medications  Medication Sig Dispense Refill   albuterol (VENTOLIN HFA) 108 (  90 Base) MCG/ACT inhaler Inhale 2 puffs into the lungs every 6 (six) hours as needed for wheezing or shortness of breath (Cough). 18 g 0   amoxicillin-clavulanate (AUGMENTIN) 875-125 MG tablet Take 1 tablet by mouth every 12 (twelve) hours. 14 tablet 0   Azelastine-Fluticasone (DYMISTA) 137-50 MCG/ACT SUSP Place 1 spray into the nose every 12 (twelve) hours. 23 g 2   benzonatate (TESSALON) 100 MG capsule Take 1 capsule (100 mg total) by mouth every 8 (eight) hours. 21 capsule 0   Blood Pressure Monitoring (BLOOD  PRESSURE KIT) DEVI Use to measure blood pressure twice daily 1 each 0   diclofenac Sodium (VOLTAREN) 1 % GEL Apply 4 g topically 4 (four) times daily. 100 g 0   Fluocinolone Acetonide (DERMOTIC) 0.01 % OIL Place 5 drops in ear(s) every 12 (twelve) hours. 20 mL 0   ibuprofen (ADVIL) 800 MG tablet Take 1 tablet (800 mg total) by mouth every 8 (eight) hours as needed for up to 30 doses for fever, headache, mild pain or moderate pain. 30 tablet 0   levocetirizine (XYZAL) 5 MG tablet Take 1 tablet (5 mg total) by mouth every evening. 90 tablet 1   lidocaine (LIDODERM) 5 % Place 1 patch onto the skin daily. Remove & Discard patch within 12 hours or as directed by MD 30 patch 0   magnesium oxide (MAG-OX) 400 MG tablet TAKE 1 CAPSULE (400 MG TOTAL) BY MOUTH 2 (TWO) TIMES DAILY. 180 tablet 2   methylPREDNISolone (MEDROL DOSEPAK) 4 MG TBPK tablet Take 24 mg on day 1, 20 mg on day 2, 16 mg on day 3, 12 mg on day 4, 8 mg on day 5, 4 mg on day 6.  Take all tablets in each row at once, do not spread tablets out throughout the day. 21 tablet 0   metoprolol succinate (TOPROL-XL) 50 MG 24 hr tablet Take 1 tablet (50 mg total) by mouth daily. Please keep upcoming appt with Cardiologist in March 2023 before anymore refills. Thank you Final attempt 90 tablet 2   Misc. Devices MISC Please provide patient with insurance approved straight cane 1 each 0   Misc. Devices MISC Single-point cane with adjustable height x1, shower chair x1, transfer bench x1.  Diagnosis congestive heart failure, degenerative disc disease of the lumbar spine. 1 each 0   potassium chloride SA (KLOR-CON M20) 20 MEQ tablet TAKE 1-2 TABS BY MOUTH AS DIRECTED. TAKE 2 TABLETS IN THE MORNING AND 1 TABLET IN THE EVENING 90 tablet 2   promethazine-dextromethorphan (PROMETHAZINE-DM) 6.25-15 MG/5ML syrup Take 5 mLs by mouth at bedtime as needed for cough. 60 mL 0   sacubitril-valsartan (ENTRESTO) 24-26 MG Take 1 tablet by mouth 2 (two) times daily. >>SEE  DIRECTIONS AS YOUR DOSE HAS CHANGED<< 180 tablet 0   simvastatin (ZOCOR) 10 MG tablet TAKE 1 TABLET BY MOUTH EVERY DAY IN THE EVENING. Please make overdue appt with Dr. Caryl Comes before anymore refills. Thank you 1st attempt 60 tablet 4   spironolactone (ALDACTONE) 25 MG tablet Take 0.5 tablets (12.5 mg total) by mouth daily. 60 tablet 2   tiZANidine (ZANAFLEX) 4 MG tablet Take 1 tablet (4 mg total) by mouth every 8 (eight) hours as needed for muscle spasms. DO NOT DRINK ALCOHOL OR DRIVE WHILE TAKING THIS MEDICATION 30 tablet 0   torsemide (DEMADEX) 20 MG tablet Take 1 tablet (20 mg total) by mouth daily. 60 tablet 2   traMADol (ULTRAM) 50 MG tablet take one tablet po q  8-12 hours as needed for severe pain only     No current facility-administered medications for this visit.    Allergies:   Ace inhibitors   Social History:  The patient  reports that she quit smoking about 7 years ago. Her smoking use included cigarettes. She has a 3.00 pack-year smoking history. She has never used smokeless tobacco. She reports that she does not drink alcohol and does not use drugs.   Family History:  The patient's family history includes Cancer in her maternal grandmother; Healthy in her mother; Hypertension in her sister.  ROS:  Please see the history of present illness.  All other systems are reviewed and otherwise negative.   PHYSICAL EXAM:  VS:  There were no vitals taken for this visit. BMI: There is no height or weight on file to calculate BMI. Well nourished, well developed, in no acute distress  HEENT: normocephalic, atraumatic  Neck: no JVD, carotid bruits or masses Cardiac: *** RRR; no significant murmurs, no rubs, or gallops Lungs: *** CTA b/l, no wheezing, rhonchi or rales  Abd: soft, non-tender, obese MS: no deformity or atrophy Ext: *** no edema is appreciated Skin: warm and dry, no rash Neuro:  No gross deficits appreciated Psych: euthymic mood, full affect   EKG:  done 05/31/21 personally  reviewed ***  08/25/21: TTE 1. Left ventricular ejection fraction, by estimation, is 60 to 65%. Left  ventricular ejection fraction by 3D volume is 65 %. The left ventricle has  normal function. The left ventricle has no regional wall motion  abnormalities. There is mild concentric  left ventricular hypertrophy. Left ventricular diastolic parameters are  consistent with Grade I diastolic dysfunction (impaired relaxation).   2. Right ventricular systolic function is normal. The right ventricular  size is normal. There is normal pulmonary artery systolic pressure. The  estimated right ventricular systolic pressure is 99991111 mmHg.   3. The mitral valve is normal in structure. Trivial mitral valve  regurgitation.   4. The aortic valve is tricuspid. Aortic valve regurgitation is not  visualized. No aortic stenosis is present.   5. The inferior vena cava is normal in size with greater than 50%  respiratory variability, suggesting right atrial pressure of 3 mmHg.   Comparison(s): Compared to prior TTE in 06/2018, there is no significant  change.    07/07/2018: TTE  1. The left ventricle has normal systolic function with an ejection  fraction of 60-65%. The cavity size was normal. There is moderately  increased left ventricular wall thickness. Left ventricular diastolic  Doppler parameters are consistent with  pseudonormalization No evidence of left ventricular regional wall motion  abnormalities.   2. The right ventricle has normal systolic function. The cavity was  normal. There is no increase in right ventricular wall thickness.   3. The mitral valve is normal in structure. No evidence of mitral valve  stenosis. No regurgitation.   4. The tricuspid valve is normal in structure.   5. The aortic valve is tricuspid no stenosis of the aortic valve.   6. The pulmonic valve was normal in structure.   7. The aortic root and ascending aorta are normal in size and structure.   8. Right atrial  pressure is estimated at 3 mmHg.   9. No complete TR doppler jet so unable to estimate PA systolic pressure.   03/29/17: TTE Study Conclusions - Left ventricle: The cavity size was normal. There was mild   concentric hypertrophy. Systolic function was mildly to  moderately reduced. The estimated ejection fraction was in the   range of 40% to 45%. Diffuse hypokinesis. Doppler parameters are   consistent with abnormal left ventricular relaxation (grade 1   diastolic dysfunction). - Left atrium: The atrium was mildly dilated.  06/15/16: TTE: LVEF 30-35% 06/15/15: TTE: LVEF 25-30% 01/21/14: TTE: LVEF 50-55% 07/31/13: TTE: LVEF 25-30%  06/16/15:L LHC There is severe left ventricular systolic dysfunction. No significant CAD. LVEDP elevated.   Recent Labs: 09/23/2021: BUN 11; Creatinine, Ser 0.86; Potassium 4.3; Sodium 142  No results found for requested labs within last 365 days.   CrCl cannot be calculated (Patient's most recent lab result is older than the maximum 21 days allowed.).   Wt Readings from Last 3 Encounters:  10/30/21 210 lb (95.3 kg)  09/23/21 207 lb (93.9 kg)  08/14/21 206 lb 12.8 oz (93.8 kg)     Other studies reviewed: Additional studies/records reviewed today include: summarized above  ASSESSMENT AND PLAN:  1. NICM 2. Chronic CHF > ?HFpEF     *** No exam findings of volume OL     *** on BB, Entresto, lasix/K+, aldactone     ***  3. HTN     *** Looks OK, no changes  4. HLD     Not addressed today       5. OSA? She reports being tested but not required CPAP   Disposition:  will have her back in 3-4 mo, sooner if needed   Current medicines are reviewed at length with the patient today.  The patient did not have any concerns regarding medicines.  Venetia Night, PA-C 07/21/2022 12:11 PM     [place] [id number] [phone] (office)  [phone] (fax)

## 2022-08-19 ENCOUNTER — Other Ambulatory Visit: Payer: Self-pay | Admitting: Critical Care Medicine

## 2022-08-19 ENCOUNTER — Other Ambulatory Visit: Payer: Self-pay | Admitting: *Deleted

## 2022-08-19 ENCOUNTER — Telehealth: Payer: Self-pay | Admitting: Internal Medicine

## 2022-08-19 MED ORDER — METOPROLOL SUCCINATE ER 50 MG PO TB24: 50.0000 mg | ORAL_TABLET | Freq: Every day | ORAL | 0 refills | Status: DC

## 2022-08-19 NOTE — Telephone Encounter (Signed)
Pt's medication was sent to pt's pharmacy as requested. Confirmation received.  °

## 2022-08-19 NOTE — Telephone Encounter (Signed)
*  STAT* If patient is at the pharmacy, call can be transferred to refill team.   1. Which medications need to be refilled? (please list name of each medication and dose if known)   metoprolol succinate (TOPROL-XL) 50 MG 24 hr tablet   2. Which pharmacy/location (including street and city if local pharmacy) is medication to be sent to?  [id number] - [place], [place] - [address] AT [address]   3. Do they need a 30 day or 90 day supply?   90 day  Patient stated she is completely out of this medication.  Patient has appointment on 4/8.

## 2022-08-20 NOTE — Telephone Encounter (Signed)
Requested medication (s) are due for refill today: yes  Requested medication (s) are on the active medication list: yes    Last refill: 09/23/21  #90  2 refills  Future visit scheduled no  Notes to clinic: Pt needs appt for additional refills. Attempted to reach pt, left VM to call back to secure appt.  Requested Prescriptions  Pending Prescriptions Disp Refills   metoprolol succinate (TOPROL-XL) 50 MG 24 hr tablet [Pharmacy Med Name: METOPROLOL SUCC ER 50 MG TAB] 90 tablet 2    Sig: PLEASE SEE ATTACHED FOR DETAILED DIRECTIONS     Cardiovascular:  Beta Blockers Failed - 08/19/2022  3:51 PM      Failed - Valid encounter within last 6 months    Recent Outpatient Visits           11 months ago Essential hypertension   [place], MD   1 year ago Chronic systolic congestive heart failure [facility])   [place] Elsie Stain, MD   3 years ago [place], [place], MD   3 years ago Nonischemic dilated cardiomyopathy [facility])   [place], Charlane Ferretti, MD   3 years ago Chronic bilateral low back pain with left-sided sciatica   [place] Gildardo Pounds, NP       Future Appointments             In 1 week Baldwin [place], PA-C [place] [facility], [place] BP in normal range    BP Readings from Last 1 Encounters:  07/16/22 126/86         Passed - Last Heart Rate in normal range    Pulse Readings from Last 1 Encounters:  07/16/22 97

## 2022-08-29 NOTE — Progress Notes (Deleted)
Cardiology Office Note Date:  08/29/2022  Patient ID:  Chelsea Branch, Chelsea Branch 1965-11-21, MRN [id number] PCP:  Storm Frisk, MD  Cardiologist:  Dr. Graciela Husbands    Chief Complaint:  *** annual visit  History of Present Illness: Chelsea Branch is a 57 y.o. 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   She comes in today to be seen for Dr.Klein, last seen by him Dec 2021, was having some dyspnea, no edema or PND, weight up 30-40lbs over a few years Orthostatic lightheadedness Echo with normalization of her EF 2020 SOB felt 2/2 weight gain, encouraged exercise and weight loss Metoprolol and entresto reduced with lightheadedness Recommended echo in 2022  ER/[facility] visits 01/05/21, leg swelling, back pain, b/l rash on elbows, d/c from the ER recommended to elevate legs and continue diuretic, f/u with cardiology 02/23/21: flank pain 05/19/22 ear drainage 05/31/21 SOB, cough, body aches + Flu 05/31/21: returned with same symptoms, continue supportive care 06/20/21 vaginal discharge 07/01/21 cough, eye drainage, nasal congestion 07/30/21 shoulder pain   I saw her 08/14/21 For the most part doing OK< 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 No CP, no daytime or exertional SOB No near syncope or syncope. Planned for labs, monitor, echo  Monitor not completed Echo with preserved EF, stable findings  Numerous ER visits w/noncardiac complaints/visits  *** symptoms *** volume *** meds   Past Medical History:  Diagnosis Date   Abnormal thyroid blood test 03/15/2017   Abnormal uterine bleeding    Alopecia    Anemia    Angina pectoris with normal coronary arteriogram (HCC) 2017   Had + Troponin c/w ? 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 iniatially ?2/2 peripartum in 1994 - improved by 2008 then worsening EF in 2011 back down to EF 25-30%. b. echo 01/21/14 showed mod LVH, [name]; 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   Peripartum cardiomyopathy 1994   Sleep apnea 2015   CPAP 12/2013   Stroke (HCC) 2011   Systolic and diastolic CHF, acute on chronic (HCC) 06/14/2015   Tobacco abuse     Past Surgical History:  Procedure Laterality Date   CARDIAC CATHETERIZATION N/A 06/16/2015   Procedure: Left Heart Cath and Coronary Angiography;  Surgeon: Corky Crafts, MD;  Location: [facility] INVASIVE CV LAB;  Service: Cardiovascular;  Laterality: N/A;   CESAREAN SECTION  1992  1994   LOOP RECORDER IMPLANT  ~ 2000   TIBIAL TUBERCLERPLASTY     TUBAL LIGATION  1994    Current Outpatient Medications  Medication Sig Dispense Refill   albuterol (VENTOLIN HFA) 108 (90 Base) MCG/ACT inhaler Inhale 2 puffs into the lungs every 6 (six)  hours as needed for wheezing or shortness of breath (Cough). 18 g 0   amoxicillin-clavulanate (AUGMENTIN) 875-125 MG tablet Take 1 tablet by mouth every 12 (twelve) hours. 14 tablet 0   Azelastine-Fluticasone (DYMISTA) 137-50 MCG/ACT SUSP Place 1 spray into the nose every 12 (twelve) hours. 23 g 2   benzonatate (TESSALON) 100 MG capsule Take 1 capsule (100 mg total) by mouth every 8 (eight) hours. 21 capsule 0   Blood Pressure Monitoring (BLOOD PRESSURE KIT) DEVI Use to measure blood pressure twice daily 1 each  0   diclofenac Sodium (VOLTAREN) 1 % GEL Apply 4 g topically 4 (four) times daily. 100 g 0   Fluocinolone Acetonide (DERMOTIC) 0.01 % OIL Place 5 drops in ear(s) every 12 (twelve) hours. 20 mL 0   ibuprofen (ADVIL) 800 MG tablet Take 1 tablet (800 mg total) by mouth every 8 (eight) hours as needed for up to 30 doses for fever, headache, mild pain or moderate pain. 30 tablet 0   levocetirizine (XYZAL) 5 MG tablet Take 1 tablet (5 mg total) by mouth every evening. 90 tablet 1   lidocaine (LIDODERM) 5 % Place 1 patch onto the skin daily. Remove & Discard patch within 12 hours or as directed by MD 30 patch 0   magnesium oxide (MAG-OX) 400 MG tablet TAKE 1 CAPSULE (400 MG TOTAL) BY MOUTH 2 (TWO) TIMES DAILY. 180 tablet 2   methylPREDNISolone (MEDROL DOSEPAK) 4 MG TBPK tablet Take 24 mg on day 1, 20 mg on day 2, 16 mg on day 3, 12 mg on day 4, 8 mg on day 5, 4 mg on day 6.  Take all tablets in each row at once, do not spread tablets out throughout the day. 21 tablet 0   metoprolol succinate (TOPROL-XL) 50 MG 24 hr tablet Take 1 tablet (50 mg total) by mouth daily. Please keep upcoming appointment in April 2024 for future refills. Thank you 30 tablet 0   Misc. Devices MISC Please provide patient with insurance approved straight cane 1 each 0   Misc. Devices MISC Single-point cane with adjustable height x1, shower chair x1, transfer bench x1.  Diagnosis congestive heart failure, degenerative disc disease of the lumbar spine. 1 each 0   potassium chloride SA (KLOR-CON M20) 20 MEQ tablet TAKE 1-2 TABS BY MOUTH AS DIRECTED. TAKE 2 TABLETS IN THE MORNING AND 1 TABLET IN THE EVENING 90 tablet 2   promethazine-dextromethorphan (PROMETHAZINE-DM) 6.25-15 MG/5ML syrup Take 5 mLs by mouth at bedtime as needed for cough. 60 mL 0   sacubitril-valsartan (ENTRESTO) 24-26 MG Take 1 tablet by mouth 2 (two) times daily. >>SEE DIRECTIONS AS YOUR DOSE HAS CHANGED<< 180 tablet 0   simvastatin (ZOCOR) 10 MG tablet TAKE 1 TABLET BY  MOUTH EVERY DAY IN THE EVENING. Please make overdue appt with Dr. Graciela HusbandsKlein before anymore refills. Thank you 1st attempt 60 tablet 4   spironolactone (ALDACTONE) 25 MG tablet Take 0.5 tablets (12.5 mg total) by mouth daily. 60 tablet 2   tiZANidine (ZANAFLEX) 4 MG tablet Take 1 tablet (4 mg total) by mouth every 8 (eight) hours as needed for muscle spasms. DO NOT DRINK ALCOHOL OR DRIVE WHILE TAKING THIS MEDICATION 30 tablet 0   torsemide (DEMADEX) 20 MG tablet Take 1 tablet (20 mg total) by mouth daily. 60 tablet 2   traMADol (ULTRAM) 50 MG tablet take one tablet po q 8-12 hours as needed for severe pain only     No current facility-administered medications for  this visit.    Allergies:   Ace inhibitors   Social History:  The patient  reports that she quit smoking about 7 years ago. Her smoking use included cigarettes. She has a 3.00 pack-year smoking history. She has never used smokeless tobacco. She reports that she does not drink alcohol and does not use drugs.   Family History:  The patient's family history includes Cancer in her maternal grandmother; Healthy in her mother; Hypertension in her sister.  ROS:  Please see the history of present illness.  All other systems are reviewed and otherwise negative.   PHYSICAL EXAM:  VS:  There were no vitals taken for this visit. BMI: There is no height or weight on file to calculate BMI. Well nourished, well developed, in no acute distress  HEENT: normocephalic, atraumatic  Neck: no JVD, carotid bruits or masses Cardiac:  *** RRR; no significant murmurs, no rubs, or gallops Lungs: *** CTA b/l, no wheezing, rhonchi or rales  Abd: soft, non-tender, obese MS: no deformity or atrophy Ext: *** no edema is appreciated Skin: warm and dry, no rash Neuro:  No gross deficits appreciated Psych: euthymic mood, full affect   EKG:  done 05/31/21 personally reviewed ***  08/25/21: TTE 1. Left ventricular ejection fraction, by estimation, is 60 to 65%. Left   ventricular ejection fraction by 3D volume is 65 %. The left ventricle has  normal function. The left ventricle has no regional wall motion  abnormalities. There is mild concentric  left ventricular hypertrophy. Left ventricular diastolic parameters are  consistent with Grade I diastolic dysfunction (impaired relaxation).   2. Right ventricular systolic function is normal. The right ventricular  size is normal. There is normal pulmonary artery systolic pressure. The  estimated right ventricular systolic pressure is 26.4 mmHg.   3. The mitral valve is normal in structure. Trivial mitral valve  regurgitation.   4. The aortic valve is tricuspid. Aortic valve regurgitation is not  visualized. No aortic stenosis is present.   5. The inferior vena cava is normal in size with greater than 50%  respiratory variability, suggesting right atrial pressure of 3 mmHg.   Comparison(s): Compared to prior TTE in 06/2018, there is no significant  change.    07/07/2018: TTE  1. The left ventricle has normal systolic function with an ejection  fraction of 60-65%. The cavity size was normal. There is moderately  increased left ventricular wall thickness. Left ventricular diastolic  Doppler parameters are consistent with  pseudonormalization No evidence of left ventricular regional wall motion  abnormalities.   2. The right ventricle has normal systolic function. The cavity was  normal. There is no increase in right ventricular wall thickness.   3. The mitral valve is normal in structure. No evidence of mitral valve  stenosis. No regurgitation.   4. The tricuspid valve is normal in structure.   5. The aortic valve is tricuspid no stenosis of the aortic valve.   6. The pulmonic valve was normal in structure.   7. The aortic root and ascending aorta are normal in size and structure.   8. Right atrial pressure is estimated at 3 mmHg.   9. No complete TR doppler jet so unable to estimate PA systolic  pressure.   03/29/17: TTE Study Conclusions - Left ventricle: The cavity size was normal. There was mild   concentric hypertrophy. Systolic function was mildly to   moderately reduced. The estimated ejection fraction was in the   range of 40% to  45%. Diffuse hypokinesis. Doppler parameters are   consistent with abnormal left ventricular relaxation (grade 1   diastolic dysfunction). - Left atrium: The atrium was mildly dilated.  06/15/16: TTE: LVEF 30-35% 06/15/15: TTE: LVEF 25-30% 01/21/14: TTE: LVEF 50-55% 07/31/13: TTE: LVEF 25-30%  06/16/15:L LHC There is severe left ventricular systolic dysfunction. No significant CAD. LVEDP elevated.   Recent Labs: 09/23/2021: BUN 11; Creatinine, Ser 0.86; Potassium 4.3; Sodium 142  No results found for requested labs within last 365 days.   CrCl cannot be calculated (Patient's most recent lab result is older than the maximum 21 days allowed.).   Wt Readings from Last 3 Encounters:  10/30/21 210 lb (95.3 kg)  09/23/21 207 lb (93.9 kg)  08/14/21 206 lb 12.8 oz (93.8 kg)     Other studies reviewed: Additional studies/records reviewed today include: summarized above  ASSESSMENT AND PLAN:  1. NICM 2. Chronic CHF > ?HFpEF     No exam findings of volume OL     *** on BB, Entresto, lasix/K+, aldactone   3. HTN     *** Looks OK, no changes  4. HLD     Not addressed today       5. OSA? *** She reports being tested but not required CPAP   Disposition:  ***   Current medicines are reviewed at length with the patient today.  The patient did not have any concerns regarding medicines.  Norma Fredrickson, PA-C 08/29/2022 8:39 AM     [facility] [address] [place] [address] [phone] (office)  [phone] (fax)

## 2022-08-30 ENCOUNTER — Ambulatory Visit: Payer: Medicaid Other | Attending: Student | Admitting: Physician Assistant

## 2022-09-13 ENCOUNTER — Telehealth: Payer: Self-pay | Admitting: Internal Medicine

## 2022-09-13 NOTE — Telephone Encounter (Signed)
Lynn, LPN, can you please advise on this matter? Thanks 

## 2022-09-13 NOTE — Telephone Encounter (Signed)
*  STAT* If patient is at the pharmacy, call can be transferred to refill team.   1. Which medications need to be refilled? (please list name of each medication and dose if known)   sacubitril-valsartan (ENTRESTO) 24-26 MG   2. Which pharmacy/location (including street and city if local pharmacy) is medication to be sent to?  [id number] - [place], [place] - [address]   3. Do they need a 30 day or 90 day supply?   90 day  Patient stated she has been out of this medication for 4 days now and wants to get some samples until she receives her prescription.

## 2022-09-14 ENCOUNTER — Telehealth: Payer: Self-pay | Admitting: Internal Medicine

## 2022-09-14 MED ORDER — SACUBITRIL-VALSARTAN 24-26 MG PO TABS: 1.0000 | ORAL_TABLET | Freq: Two times a day (BID) | ORAL | 0 refills | Status: DC

## 2022-09-14 NOTE — Telephone Encounter (Signed)
Pt's medication was sent to pt's pharmacy as requested. Confirmation received.  °

## 2022-09-14 NOTE — Telephone Encounter (Signed)
**Note De-Identified Avary Pitsenbarger Obfuscation** This is not a PA issue but is a refill request:  The pt was last seen in clinic for a office visit with Francis Dowse, PA-c 08/14/2021 and has no showed her last 3 appointments with Francis Dowse, PA-c on 12/28/2021, 07/21/2022, and 08/30/2022.  I called her and she stated that [facility].  I advised her that we can only fill for a 30 day supply. She states that she will call the office during her lunch break today to schedule her f/u appointment and then ended the call.

## 2022-09-14 NOTE — Telephone Encounter (Signed)
Pt just made an appt for May 2024. Would Dr. Graciela Husbands like to refill this medication until appt time? Please address

## 2022-09-14 NOTE — Telephone Encounter (Signed)
*  STAT* If patient is at the pharmacy, call can be transferred to refill team.   1. Which medications need to be refilled? (please list name of each medication and dose if known)  sacubitril-valsartan (ENTRESTO) 24-26 MG  2. Which pharmacy/location (including street and city if local pharmacy) is medication to be sent to? [id number] - [place], [place] - [address]  3. Do they need a 30 day or 90 day supply?  30 day supply

## 2022-09-14 NOTE — Telephone Encounter (Signed)
Pt is requesting a refilll on entresto 24-26 mg tablets. This medication was changed by pt's PCP. Would Dr. Graciela Husbands like to reorder this medication or would he like for the pt to get it from her PCP. Please address

## 2022-09-19 ENCOUNTER — Ambulatory Visit (INDEPENDENT_AMBULATORY_CARE_PROVIDER_SITE_OTHER): Payer: Medicaid Other

## 2022-09-19 ENCOUNTER — Ambulatory Visit (HOSPITAL_COMMUNITY)
Admission: EM | Admit: 2022-09-19 | Discharge: 2022-09-19 | Disposition: A | Discharge status: Home / Self Care | Payer: Medicaid Other | Attending: Emergency Medicine | Admitting: Emergency Medicine

## 2022-09-19 ENCOUNTER — Encounter (HOSPITAL_COMMUNITY): Payer: Self-pay

## 2022-09-19 DIAGNOSIS — R0602 Shortness of breath: Secondary | ICD-10-CM | POA: Diagnosis not present

## 2022-09-19 DIAGNOSIS — M545 Low back pain, unspecified: Secondary | ICD-10-CM

## 2022-09-19 DIAGNOSIS — G8929 Other chronic pain: Secondary | ICD-10-CM

## 2022-09-19 DIAGNOSIS — M25512 Pain in left shoulder: Secondary | ICD-10-CM

## 2022-09-19 MED ORDER — DEXAMETHASONE SODIUM PHOSPHATE 10 MG/ML IJ SOLN
INTRAMUSCULAR | Status: AC
  Filled 2022-09-19: qty 1

## 2022-09-19 MED ORDER — PREDNISONE 20 MG PO TABS: 40.0000 mg | ORAL_TABLET | Freq: Every day | ORAL | 0 refills | Status: DC

## 2022-09-19 MED ORDER — ALBUTEROL SULFATE HFA 108 (90 BASE) MCG/ACT IN AERS: 2.0000 | INHALATION_SPRAY | RESPIRATORY_TRACT | 0 refills | Status: DC | PRN

## 2022-09-19 MED ORDER — DEXAMETHASONE SODIUM PHOSPHATE 10 MG/ML IJ SOLN
10.0000 mg | Freq: Once | INTRAMUSCULAR | Status: AC
  Administered 2022-09-19: 10 mg via INTRAMUSCULAR

## 2022-09-19 NOTE — ED Provider Notes (Signed)
[facility]    CSN: [id number] Arrival date & time: 09/19/22  1331      History   Chief Complaint Chief Complaint  Patient presents with   Shortness of Breath   Congestive Heart Failure   Back Pain    HPI Chelsea Branch is a 57 y.o. female.   Patient presents for evaluation of left shoulder pain and bilateral low back pain beginning 3 days ago.  Pain has been constant, progressively worsening in severity.  Pain intermittently radiates down the bilateral lower legs, denies numbness or tingling.  Endorses that she has chronic back pain that she received spinal injections for, unsure if she is due for 1.  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  Has attempted use of Tylenol which has been ineffective, due to CHF has been told not to use ibuprofen.  Denies new injury or trauma, urinary or bowel incontinence.  Patient endorses shortness of breath over the past week occurring predominantly at nighttime when lying flat.  Has attempted use of a blue and gray inhaler that she is deemed ineffective, unsure of medication name.  Endorses that she has CHF and is unsure if this is the cause of shortness of breath.  Does not weigh self daily and is unsure of current weight.  Is unsure of leg swelling.  Has been taking Entresto daily, has not been taking torsemide, uses as needed.     Past Medical History:  Diagnosis Date   Abnormal thyroid blood test 03/15/2017   Abnormal uterine bleeding    Alopecia    Anemia    Angina pectoris with normal coronary arteriogram (HCC) 2017   Had + Troponin c/w ? 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 iniatially ?2/2 peripartum in 1994 - improved by 2008 then worsening EF in 2011 back down to EF 25-30%. b. echo 01/21/14 showed mod LVH, [name]; 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   Peripartum cardiomyopathy 1994   Sleep apnea 2015   CPAP 12/2013   Stroke (HCC) 2011   Systolic and diastolic CHF, acute on chronic (HCC) 06/14/2015   Tobacco abuse     Patient Active Problem List   Diagnosis Date Noted   Chronic pain syndrome 12/28/2021   Primary osteoarthritis of both knees 12/28/2021   Spinal stenosis of lumbar region 07/21/2021   Lumbar radiculitis 07/21/2021   COPD exacerbation (HCC) 07/21/2021   Mechanical complication due to intrauterine contraceptive device 07/21/2021   Arthropathy of lumbar facet joint 03/14/2019   Degenerative disc disease at L5-S1 level 10/10/2018   Abnormal thyroid blood test 03/15/2017   Hypokalemia 07/23/2016   Low TSH level 04/26/2016   Abnormal uterine bleeding 04/06/2016   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   History of non-ST elevation myocardial infarction (NSTEMI) 06/14/2015   Low back pain 09/16/2014   Homelessness 09/16/2014   Vitamin D deficiency 01/29/2014  OSA (obstructive sleep apnea) 01/08/2014   Chronic combined systolic and diastolic CHF (congestive heart failure) (HCC) 08/14/2013   Essential hypertension    Fibroids 08/09/2012   Menorrhagia 05/08/2012   Female pattern hair loss    HLD (hyperlipidemia) 06/08/2010   CEREBRAL ANEURYSM 06/08/2010   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   CEREBROVASCULAR ACCIDENT, HX OF 06/08/2010    Past Surgical History:  Procedure Laterality Date   CARDIAC CATHETERIZATION N/A 06/16/2015   Procedure: Left Heart Cath and Coronary Angiography;  Surgeon: Corky Crafts, MD;   Location: [facility];  Service: Cardiovascular;  Laterality: N/A;   CESAREAN SECTION  1992  1994   LOOP RECORDER IMPLANT  ~ 2000   TIBIAL TUBERCLERPLASTY     TUBAL LIGATION  1994    OB History     Gravida  2   Para  2   Term  2   Preterm      AB      Living  3      SAB      IAB      Ectopic      Multiple  1   Live Births  3            Home Medications    Prior to Admission medications   Medication Sig Start Date End Date Taking? Authorizing Provider  albuterol (VENTOLIN HFA) 108 (90 Base) MCG/ACT inhaler Inhale 2 puffs into the lungs every 6 (six) hours as needed for wheezing or shortness of breath (Cough). 07/16/22  Yes Raspet, Erin K, PA-C  benzonatate (TESSALON) 100 MG capsule Take 1 capsule (100 mg total) by mouth every 8 (eight) hours. 07/16/22  Yes Raspet, Noberto Retort, PA-C  Blood Pressure Monitoring (BLOOD PRESSURE KIT) DEVI Use to measure blood pressure twice daily 07/22/21  Yes Storm Frisk, MD  diclofenac Sodium (VOLTAREN) 1 % GEL Apply 4 g topically 4 (four) times daily. 09/23/21  Yes Storm Frisk, MD  Fluocinolone Acetonide (DERMOTIC) 0.01 % OIL Place 5 drops in ear(s) every 12 (twelve) hours. 07/08/22  Yes Theadora Rama Scales, PA-C  ibuprofen (ADVIL) 800 MG tablet Take 1 tablet (800 mg total) by mouth every 8 (eight) hours as needed for up to 30 doses for fever, headache, mild pain or moderate pain. 07/08/22  Yes Theadora Rama Scales, PA-C  levocetirizine (XYZAL) 5 MG tablet Take 1 tablet (5 mg total) by mouth every evening. 07/08/22 01/04/23 Yes Theadora Rama Scales, PA-C  lidocaine (LIDODERM) 5 % Place 1 patch onto the skin daily. Remove & Discard patch within 12 hours or as directed by MD 09/23/21  Yes Storm Frisk, MD  metoprolol succinate (TOPROL-XL) 50 MG 24 hr tablet Take 1 tablet (50 mg total) by mouth daily. Please keep upcoming appointment in April 2024 for future refills. Thank you 08/19/22  Yes [name], MD  Misc. Devices  MISC Please provide patient with insurance approved straight cane 09/29/18  Yes Claiborne Rigg, NP  Misc. Devices MISC Single-point cane with adjustable height x1, shower chair x1, transfer bench x1.  Diagnosis congestive heart failure, degenerative disc disease of the lumbar spine. 11/23/18  Yes [name], MD  amoxicillin-clavulanate (AUGMENTIN) 875-125 MG tablet Take 1 tablet by mouth every 12 (twelve) hours. 07/16/22   Raspet, Noberto Retort, PA-C  Azelastine-Fluticasone (DYMISTA) 137-50 MCG/ACT SUSP Place 1 spray into the nose every 12 (twelve) hours. 07/08/22 08/07/22  Theadora Rama Scales, PA-C  magnesium oxide (MAG-OX) 400 MG tablet TAKE 1  CAPSULE (400 MG TOTAL) BY MOUTH 2 (TWO) TIMES DAILY. 07/21/21   Storm Frisk, MD  methylPREDNISolone (MEDROL DOSEPAK) 4 MG TBPK tablet Take 24 mg on day 1, 20 mg on day 2, 16 mg on day 3, 12 mg on day 4, 8 mg on day 5, 4 mg on day 6.  Take all tablets in each row at once, do not spread tablets out throughout the day. 07/08/22   Theadora Rama Scales, PA-C  potassium chloride SA (KLOR-CON M20) 20 MEQ tablet TAKE 1-2 TABS BY MOUTH AS DIRECTED. TAKE 2 TABLETS IN THE MORNING AND 1 TABLET IN THE EVENING 09/23/21   Storm Frisk, MD  promethazine-dextromethorphan (PROMETHAZINE-DM) 6.25-15 MG/5ML syrup Take 5 mLs by mouth at bedtime as needed for cough. 07/08/22   Theadora Rama Scales, PA-C  sacubitril-valsartan (ENTRESTO) 24-26 MG Take 1 tablet by mouth 2 (two) times daily. 09/14/22   [name], MD  simvastatin (ZOCOR) 10 MG tablet TAKE 1 TABLET BY MOUTH EVERY DAY IN THE EVENING. Please make overdue appt with Dr. Graciela Husbands before anymore refills. Thank you 1st attempt 09/23/21   Storm Frisk, MD  spironolactone (ALDACTONE) 25 MG tablet Take 0.5 tablets (12.5 mg total) by mouth daily. 09/23/21   Storm Frisk, MD  tiZANidine (ZANAFLEX) 4 MG tablet Take 1 tablet (4 mg total) by mouth every 8 (eight) hours as needed for muscle spasms. DO NOT DRINK ALCOHOL OR DRIVE  WHILE TAKING THIS MEDICATION 10/17/21   Ellsworth Lennox, PA-C  torsemide (DEMADEX) 20 MG tablet Take 1 tablet (20 mg total) by mouth daily. 09/23/21   Storm Frisk, MD  traMADol Janean Sark) 50 MG tablet take one tablet po q 8-12 hours as needed for severe pain only 10/07/21   [provider]  DULoxetine (CYMBALTA) 30 MG capsule Take 1 capsule (30 mg total) by mouth daily. 12/07/18 12/10/19  [name], MD  famotidine (PEPCID) 20 MG tablet Take 1 tablet (20 mg total) 2 (two) times daily by mouth. 04/10/17 11/03/19  Elisha Ponder, PA-C    Family History Family History  Problem Relation Age of Onset   Cancer Maternal Grandmother        uterine   Hypertension Sister    Healthy Mother    Other Neg Hx    Heart disease Neg Hx     Social History Social History   Tobacco Use   Smoking status: Former    Packs/day: 0.10    Years: 30.00    Additional pack years: 0.00    Total pack years: 3.00    Types: Cigarettes    Quit date: 06/03/2015    Years since quitting: 7.3   Smokeless tobacco: Never   Tobacco comments:    Pt. stated she stopped smoking a year ago. 09/29/2018  Vaping Use   Vaping Use: Never used  Substance Use Topics   Alcohol use: No    Alcohol/week: 0.0 standard drinks of alcohol   Drug use: No     Allergies   Ace inhibitors   Review of Systems Review of Systems Defer to [facility]    Physical Exam Triage Vital Signs ED Triage Vitals [09/19/22 1414]  Enc Vitals Group     BP (!) 153/95     Pulse Rate 84     Resp 16     Temp 98.3 F (36.8 C)     Temp Source Oral     SpO2 96 %     Weight  Height      Head Circumference      Peak Flow      Pain Score      Pain Loc      Pain Edu?      Excl. in GC?    No data found.  Updated Vital Signs BP (!) 153/95 (BP Location: Left Arm)   Pulse 84   Temp 98.3 F (36.8 C) (Oral)   Resp 16   SpO2 96%   Visual Acuity Right Eye Distance:   Left Eye Distance:   Bilateral Distance:    Right Eye Near:    Left Eye Near:    Bilateral Near:     Physical Exam Constitutional:      Appearance: Normal appearance.  Eyes:     Extraocular Movements: Extraocular movements intact.  Pulmonary:     Effort: Pulmonary effort is normal.     Comments: Diminished in all lobes Musculoskeletal:     Right lower leg: No edema.     Left lower leg: No edema.     Comments: Tenderness is present to the midline of the lumbar region without ecchymosis, swelling or deformity, able to twist turn and bend  Tenderness is generalized to the show to relate in the superior of the shoulder without ecchymosis, swelling or deformity, able to complete range of motion, pain is elicited when arm is extended laterally above the head, negative Hawkins sign, 2+ brachial pulse, strength is a 5 out of 5  Neurological:     Mental Status: She is alert and oriented to person, place, and time. Mental status is at baseline.      UC Treatments / Results  Labs (all labs ordered are listed, but only abnormal results are displayed) Labs Reviewed - No data to display  EKG   Radiology No results found.  Procedures Procedures (including critical care time)  Medications Ordered in UC Medications - No data to display  Initial Impression / Assessment and Plan / UC Course  I have reviewed the triage vital signs and the nursing notes.  Pertinent labs & imaging results that were available during my care of the patient were reviewed by me and considered in my medical decision making (see chart for details).  Shortness of breath, acute pain of left shoulder, chronic midline low back pain without sciatica  Vital signs are stable and patient is in no signs of distress nontoxic-appearing, lungs are diminished, chest x-ray is negative, discussed findings with patient, prescribed albuterol inhaler as well as prednisone which may be used in conjunction to help with pain which I believe today is muscular therefore imaging deferred, Decadron  injection given in office, strongly advised patient to take torsemide daily and to weigh self to monitor fluid levels, recommended RICE additionally and advised follow-up with her orthopedic specialist and/or cardiologist if symptoms persist or worsen Final Clinical Impressions(s) / UC Diagnoses   Final diagnoses:  None   Discharge Instructions   None    ED Prescriptions   None    PDMP not reviewed this encounter.   Valinda Hoar, [place] 09/19/22 [phone]

## 2022-09-19 NOTE — ED Triage Notes (Signed)
Here for left side shoulder and back pain x 3 days. Pt states she is SOB and would like a inhaler.

## 2022-09-19 NOTE — Discharge Instructions (Addendum)
Chest x-ray is negative  Ventolin inhaler has been prescribed, insurance will not pay for proair inhaler, may take 2 puffs every 4-6 hours as needed for shortness of breath  Please take your furosemide every day, this is not a as needed medication, this helps to keep fluid from building up  Please weigh self daily, this helps you to monitor if you are building up fluid, if you gain more than 3 pounds in a 24-hour.  Please reach out to your cardiologist, if you gain more than 5 pounds please seek in person evaluation  Shoulder pain is most likely muscular related to the pushing pulling motion that you are doing at work, your back pain is most likely your chronic pain flared  You have been given an injection of Decadron here in the office to help minimize your symptoms  Starting tomorrow take prednisone every morning with food for 5 days to help reduce inflammation which in turn will help with your pain  May use ice or heat over the affected area in 10 to 15-minute intervals  Continue activity as tolerated  May follow-up with your therapeutic specialist symptoms persist or worsen

## 2022-09-21 ENCOUNTER — Telehealth: Payer: Self-pay | Admitting: Internal Medicine

## 2022-09-21 MED ORDER — TORSEMIDE 20 MG PO TABS: 20.0000 mg | ORAL_TABLET | Freq: Every day | ORAL | 0 refills | Status: DC

## 2022-09-21 NOTE — Telephone Encounter (Signed)
*  STAT* If patient is at the pharmacy, call can be transferred to refill team.   1. Which medications need to be refilled? (please list name of each medication and dose if known)   torsemide (DEMADEX) 20 MG tablet   2. Which pharmacy/location (including street and city if local pharmacy) is medication to be sent to?  [id number] - [place], [place] - [address]   3. Do they need a 30 day or 90 day supply?   90 days  Patient stated she is completely out of this medication.

## 2022-09-21 NOTE — Telephone Encounter (Signed)
Pt's medication was sent to pt's pharmacy as requested. Confirmation received.  °

## 2022-10-01 NOTE — Progress Notes (Unsigned)
  Electrophysiology Office Note:   Date:  10/04/2022  ID:  Chelsea Branch, DOB 1965/10/03, MRN [id number]  Primary Cardiologist: Sherryl Manges, MD Electrophysiologist: Sherryl Manges, MD   History of Present Illness:   Chelsea Branch is a 57 y.o. 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  Since last being seen in our clinic the patient reports fatigue and malaise for approx 6-8 weeks. This has also been associated with generalized body aches for the past month. She denies any inciting factors such as acute illness or tick bite. She has not seen other providers about this yet. She has been taking her medications as directed. She gets some relief from tylenol. Denies chest pain, dyspnea, vomiting, dizziness, syncope, edema, weight gain, or early satiety.   Review of systems complete and found to be negative unless listed in HPI.   Studies Reviewed:    EKG is ordered today. Personal review shows NSR at 74 bpm  Echo 08/2021 LVEF 60-65%, Grade 1 DD  Physical Exam:   VS:  BP 134/88   Pulse 74   Ht 5\' 8"  (1.727 m)   Wt 202 lb 12.8 oz (92 kg)   SpO2 98%   BMI 30.84 kg/m    Wt Readings from Last 3 Encounters:  10/04/22 202 lb 12.8 oz (92 kg)  10/30/21 210 lb (95.3 kg)  09/23/21 207 lb (93.9 kg)     GEN: Well nourished, well developed in no acute distress NECK: No JVD; No carotid bruits CARDIAC: Regular rate and rhythm, no murmurs, rubs, gallops RESPIRATORY:  Clear to auscultation without rales, wheezing or rhonchi  ABDOMEN: Soft, non-tender, non-distended EXTREMITIES:  No edema; No deformity   ASSESSMENT AND PLAN:    NICM HFpEF EF 60-65% 08/2021 Volume status stable on exam NYHA II-III symptoms  HTN Stable on current regimen   Fatigue Generalized weakness Body aches For approx 6-8 weeks with no clear inciting factor.  Denies acute  illness or any known contacts with ticks.  Has not followed up with PCP. Recommended close visit.  Will check BMET, CBC, TSH today. Will also update echo (which she understands would not explain aches)  Follow up with Dr. Graciela Husbands in 6 months  Signed, [name], PA-C

## 2022-10-02 ENCOUNTER — Other Ambulatory Visit: Payer: Self-pay | Admitting: Internal Medicine

## 2022-10-04 ENCOUNTER — Encounter: Payer: Self-pay | Admitting: *Deleted

## 2022-10-04 ENCOUNTER — Encounter: Payer: Self-pay | Admitting: Student

## 2022-10-04 ENCOUNTER — Ambulatory Visit: Payer: Medicaid Other | Attending: Student | Admitting: Student

## 2022-10-04 VITALS — BP 134/88 | HR 74 | Ht 68.0 in | Wt 202.8 lb

## 2022-10-04 DIAGNOSIS — R5383 Other fatigue: Secondary | ICD-10-CM | POA: Diagnosis not present

## 2022-10-04 DIAGNOSIS — I420 Dilated cardiomyopathy: Secondary | ICD-10-CM | POA: Diagnosis not present

## 2022-10-04 DIAGNOSIS — I5032 Chronic diastolic (congestive) heart failure: Secondary | ICD-10-CM | POA: Diagnosis not present

## 2022-10-04 DIAGNOSIS — I10 Essential (primary) hypertension: Secondary | ICD-10-CM | POA: Diagnosis not present

## 2022-10-04 NOTE — Patient Instructions (Signed)
Medication Instructions:  Your physician recommends that you continue on your current medications as directed. Please refer to the Current Medication list given to you today.  *If you need a refill on your cardiac medications before your next appointment, please call your pharmacy*   Lab Work: BMET, CBC, TSH--TODAY If you have labs (blood work) drawn today and your tests are completely normal, you will receive your results only by: MyChart Message (if you have MyChart) OR A paper copy in the mail If you have any lab test that is abnormal or we need to change your treatment, we will call you to review the results.   Testing/Procedures: Your physician has requested that you have an echocardiogram. Echocardiography is a painless test that uses sound waves to create images of your heart. It provides your doctor with information about the size and shape of your heart and how well your heart's chambers and valves are working. This procedure takes approximately one hour. There are no restrictions for this procedure. Please do NOT wear cologne, perfume, aftershave, or lotions (deodorant is allowed). Please arrive 15 minutes prior to your appointment time.    Follow-Up: At [facility], you and your health needs are our priority.  As part of our continuing mission to provide you with exceptional heart care, we have created designated Provider Care Teams.  These Care Teams include your primary Cardiologist (physician) and Advanced Practice Providers (APPs -  Physician Assistants and Nurse Practitioners) who all work together to provide you with the care you need, when you need it.  We recommend signing up for the patient portal called "MyChart".  Sign up information is provided on this After Visit Summary.  MyChart is used to connect with patients for Virtual Visits (Telemedicine).  Patients are able to view lab/test results, encounter notes, upcoming appointments, etc.  Non-urgent messages can  be sent to your provider as well.   To learn more about what you can do with MyChart, go to [email or web address].    Your next appointment:   6 month(s)  Provider:   Sherryl Manges, MD

## 2022-10-05 ENCOUNTER — Telehealth: Payer: Self-pay | Admitting: Internal Medicine

## 2022-10-05 LAB — BASIC METABOLIC PANEL
BUN/Creatinine Ratio: 11 (ref 9–23)
BUN: 8 mg/dL (ref 6–24)
CO2: 23 mmol/L (ref 20–29)
Calcium: 8.9 mg/dL (ref 8.7–10.2)
Chloride: 104 mmol/L (ref 96–106)
Creatinine, Ser: 0.7 mg/dL (ref 0.57–1.00)
Glucose: 128 mg/dL — ABNORMAL HIGH (ref 70–99)
Potassium: 3.5 mmol/L (ref 3.5–5.2)
Sodium: 141 mmol/L (ref 134–144)
eGFR: 101 mL/min/{1.73_m2} (ref >59)

## 2022-10-05 LAB — CBC
Hematocrit: 43.7 % (ref 34.0–46.6)
Hemoglobin: 14.6 g/dL (ref 11.1–15.9)
MCH: 28.2 pg (ref 26.6–33.0)
MCHC: 33.4 g/dL (ref 31.5–35.7)
MCV: 84 fL (ref 79–97)
Platelets: 194 10*3/uL (ref 150–450)
RBC: 5.18 x10E6/uL (ref 3.77–5.28)
RDW: 12.3 % (ref 11.7–15.4)
WBC: 4.3 10*3/uL (ref 3.4–10.8)

## 2022-10-05 LAB — TSH: TSH: 0.216 u[IU]/mL — ABNORMAL LOW (ref 0.450–4.500)

## 2022-10-05 NOTE — Telephone Encounter (Signed)
Patient is returning call and is requesting call back in regards to results.

## 2022-10-05 NOTE — Telephone Encounter (Signed)
Spoke with patient, see lab results.

## 2022-10-05 NOTE — Telephone Encounter (Signed)
Patient returned RN's call regarding results. 

## 2022-10-06 ENCOUNTER — Ambulatory Visit: Payer: Medicaid Other | Attending: Critical Care Medicine | Admitting: Critical Care Medicine

## 2022-10-06 ENCOUNTER — Encounter: Payer: Self-pay | Admitting: Critical Care Medicine

## 2022-10-06 VITALS — BP 94/69 | HR 92 | Temp 98.3°F | Ht 68.0 in | Wt 200.0 lb

## 2022-10-06 DIAGNOSIS — G4733 Obstructive sleep apnea (adult) (pediatric): Secondary | ICD-10-CM | POA: Diagnosis not present

## 2022-10-06 DIAGNOSIS — B369 Superficial mycosis, unspecified: Secondary | ICD-10-CM

## 2022-10-06 DIAGNOSIS — J432 Centrilobular emphysema: Secondary | ICD-10-CM | POA: Diagnosis not present

## 2022-10-06 DIAGNOSIS — I5022 Chronic systolic (congestive) heart failure: Secondary | ICD-10-CM

## 2022-10-06 DIAGNOSIS — R7989 Other specified abnormal findings of blood chemistry: Secondary | ICD-10-CM | POA: Diagnosis not present

## 2022-10-06 DIAGNOSIS — I10 Essential (primary) hypertension: Secondary | ICD-10-CM

## 2022-10-06 DIAGNOSIS — I420 Dilated cardiomyopathy: Secondary | ICD-10-CM | POA: Diagnosis not present

## 2022-10-06 DIAGNOSIS — Z1231 Encounter for screening mammogram for malignant neoplasm of breast: Secondary | ICD-10-CM | POA: Diagnosis not present

## 2022-10-06 DIAGNOSIS — L658 Other specified nonscarring hair loss: Secondary | ICD-10-CM

## 2022-10-06 DIAGNOSIS — H6243 Otitis externa in other diseases classified elsewhere, bilateral: Secondary | ICD-10-CM | POA: Diagnosis not present

## 2022-10-06 DIAGNOSIS — E785 Hyperlipidemia, unspecified: Secondary | ICD-10-CM

## 2022-10-06 MED ORDER — IBUPROFEN 800 MG PO TABS: 800.0000 mg | ORAL_TABLET | Freq: Three times a day (TID) | ORAL | 0 refills | Status: DC | PRN

## 2022-10-06 MED ORDER — DICLOFENAC SODIUM 1 % EX GEL: 4.0000 g | Freq: Four times a day (QID) | CUTANEOUS | 0 refills | Status: DC

## 2022-10-06 MED ORDER — KETOCONAZOLE 2 % EX SHAM: 1.0000 | MEDICATED_SHAMPOO | CUTANEOUS | 0 refills | Status: DC

## 2022-10-06 MED ORDER — SIMVASTATIN 10 MG PO TABS
ORAL_TABLET | ORAL | 4 refills | Status: DC
Start: 2022-10-06 — End: 2022-11-22

## 2022-10-06 MED ORDER — SPIRONOLACTONE 25 MG PO TABS: 12.5000 mg | ORAL_TABLET | Freq: Every day | ORAL | 2 refills | Status: DC

## 2022-10-06 MED ORDER — TORSEMIDE 20 MG PO TABS: 20.0000 mg | ORAL_TABLET | Freq: Every day | ORAL | 3 refills | Status: DC

## 2022-10-06 MED ORDER — SACUBITRIL-VALSARTAN 24-26 MG PO TABS: 1.0000 | ORAL_TABLET | Freq: Two times a day (BID) | ORAL | 4 refills | Status: DC

## 2022-10-06 MED ORDER — FLUOCINOLONE ACETONIDE 0.01 % OT OIL: 5.0000 [drp] | TOPICAL_OIL | Freq: Two times a day (BID) | OTIC | 0 refills | Status: DC

## 2022-10-06 MED ORDER — POTASSIUM CHLORIDE CRYS ER 20 MEQ PO TBCR: EXTENDED_RELEASE_TABLET | ORAL | 2 refills | Status: DC

## 2022-10-06 MED ORDER — MAGNESIUM OXIDE 400 MG PO TABS: ORAL_TABLET | ORAL | 2 refills | Status: DC

## 2022-10-06 MED ORDER — LEVOCETIRIZINE DIHYDROCHLORIDE 5 MG PO TABS: 5.0000 mg | ORAL_TABLET | Freq: Every evening | ORAL | 1 refills | Status: DC

## 2022-10-06 MED ORDER — AZELASTINE-FLUTICASONE 137-50 MCG/ACT NA SUSP: 1.0000 | Freq: Two times a day (BID) | NASAL | 2 refills | Status: DC

## 2022-10-06 MED ORDER — TIZANIDINE HCL 4 MG PO TABS: 4.0000 mg | ORAL_TABLET | Freq: Three times a day (TID) | ORAL | 0 refills | Status: DC | PRN

## 2022-10-06 NOTE — Patient Instructions (Signed)
Medication refill sent to your pharmacy  Fungal shampoo use daily sent to pharmacy and eardrops sent  Keep appointments with the neurosurgeon  Letter produced for your visit today  Mammogram is needed for breast cancer screening this was ordered  Return to see Dr. Delford Field 3 months

## 2022-10-06 NOTE — Progress Notes (Unsigned)
Established Patient Office Visit  Subjective   [name], female    DOB: July 26, 1965  Age: 57 y.o. MRN: [id number]  No chief complaint on file.   57 y.o.F with HTN CHF OSA Copd  Not seen since 09/2021 Multiple ED visits since Cards 10/04/22: Chelsea Branch is a 58 y.o. 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   Since last being seen in our clinic the patient reports fatigue and malaise for approx 6-8 weeks. This has also been associated with generalized body aches for the past month. She denies any inciting factors such as acute illness or tick bite. She has not seen other providers about this yet. She has been taking her medications as directed. She gets some relief from tylenol. Denies chest pain, dyspnea, vomiting, dizziness, syncope, edema, weight gain, or early satiety.    Review of systems complete and found to be negative unless listed in HPI.    Studies Reviewed:    EKG is ordered today. Personal review shows NSR at 74 bpm   Echo 08/2021 LVEF 60-65%, Grade 1 DD   Physical Exam:  VS:  BP 134/88   Pulse 74   Ht 5\' 8"  (1.727 m)   Wt 202 lb 12.8 oz (92 kg)   SpO2 98%   BMI 30.84 kg/m    Wt Readings from Last 3 Encounters: 10/04/22 202 lb 12.8 oz (92 kg) 10/30/21 210 lb (95.3 kg) 09/23/21 207 lb (93.9 kg)    Electrophysiology Office Note:  Date:  10/04/2022  ID:  Chelsea Branch, DOB 1966/02/15, MRN [id number]   Primary Cardiologist: Sherryl Manges, MD Electrophysiologist: Sherryl Manges, MD     History of Present Illness:  Chelsea Branch is a 57 y.o. 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   Since last being seen in our clinic the patient reports fatigue and malaise for  approx 6-8 weeks. This has also been associated with generalized body aches for the past month. She denies any inciting factors such as acute illness or tick bite. She has not seen other providers about this yet. She has been taking her medications as directed. She gets some relief from tylenol. Denies chest pain, dyspnea, vomiting, dizziness, syncope, edema, weight gain, or early satiety.    Review of systems complete and found to be negative unless listed in HPI.     Studies Reviewed:    EKG is ordered today. Personal review shows NSR at 74 bpm   Echo 08/2021 LVEF 60-65%, Grade 1 DD    Physical Exam:  VS:  BP 134/88   Pulse 74   Ht 5\' 8"  (1.727 m)   Wt 202 lb 12.8 oz (92 kg)   SpO2 98%   BMI 30.84 kg/m     Wt Readings from Last 3 Encounters: 10/04/22 202 lb 12.8 oz (92 kg) 10/30/21 210 lb (95.3 kg) 09/23/21 207 lb (93.9 kg)     GEN: Well nourished, well developed in no acute distress NECK: No JVD; No carotid bruits CARDIAC: Regular rate and rhythm, no murmurs, rubs, gallops RESPIRATORY:  Clear to auscultation without rales, wheezing or rhonchi  ABDOMEN: Soft, non-tender, non-distended EXTREMITIES:  No edema; No deformity     ASSESSMENT AND PLAN:    NICM HFpEF EF  60-65% 08/2021 Volume status stable on exam NYHA II-III symptoms   HTN Stable on current regimen    Fatigue Generalized weakness Body aches For approx 6-8 weeks with no clear inciting factor.  Denies acute illness or any known contacts with ticks.  Has not followed up with PCP. Recommended close visit.  Will check BMET, CBC, TSH today. Will also update echo (which she understands would not explain aches)   Follow up with Dr. Graciela Husbands in 6 months   Signed, [name], PA-C    Lab Work: BMET, CBC, TSH--TODAY  Mammo colon pap         {History (Optional):23778}  ROS    Objective:     There were no vitals taken for this visit. {Vitals History (Optional):23777}  Physical  Exam   No results found for any visits on 10/06/22.  {Labs (Optional):23779}  The ASCVD Risk score (Arnett DK, et al., 2019) failed to calculate for the following reasons:   The patient has a prior MI or stroke diagnosis    Assessment & Plan:   Problem List Items Addressed This Visit   None   No follow-ups on file.    Shan Levans, MD

## 2022-10-07 DIAGNOSIS — B369 Superficial mycosis, unspecified: Secondary | ICD-10-CM | POA: Insufficient documentation

## 2022-10-07 NOTE — Assessment & Plan Note (Signed)
Continue with lipid therapy 

## 2022-10-07 NOTE — Assessment & Plan Note (Signed)
Renew Dermotic eardrops

## 2022-10-07 NOTE — Assessment & Plan Note (Signed)
Management per cardiology.   

## 2022-10-07 NOTE — Assessment & Plan Note (Signed)
Thyroid function stable

## 2022-10-07 NOTE — Assessment & Plan Note (Addendum)
Areas of fungal irritation to the scalp with female pattern hair loss Begin Nizoral shampoo

## 2022-10-07 NOTE — Assessment & Plan Note (Signed)
Blood pressure controlled no change in medication 

## 2022-10-07 NOTE — Assessment & Plan Note (Signed)
COPD stable at this time continue inhalers

## 2022-10-07 NOTE — Assessment & Plan Note (Signed)
Patient needs reassessment not studied since 2015 Plan to refer patient back to pulmonary medicine for further evaluations Patient's complaints to date largely probably centered around sleep apnea

## 2022-10-20 DIAGNOSIS — L669 Cicatricial alopecia, unspecified: Secondary | ICD-10-CM | POA: Diagnosis not present

## 2022-10-25 DIAGNOSIS — M5416 Radiculopathy, lumbar region: Secondary | ICD-10-CM | POA: Diagnosis not present

## 2022-11-01 ENCOUNTER — Ambulatory Visit (HOSPITAL_COMMUNITY): Payer: Medicaid Other

## 2022-11-18 ENCOUNTER — Ambulatory Visit (HOSPITAL_COMMUNITY): Payer: Medicaid Other | Attending: Student

## 2022-11-18 ENCOUNTER — Encounter (HOSPITAL_COMMUNITY): Payer: Self-pay | Admitting: Student

## 2022-11-22 ENCOUNTER — Encounter (HOSPITAL_COMMUNITY): Payer: Self-pay | Admitting: *Deleted

## 2022-11-22 ENCOUNTER — Ambulatory Visit (HOSPITAL_COMMUNITY)
Admission: EM | Admit: 2022-11-22 | Discharge: 2022-11-22 | Disposition: A | Discharge status: Home / Self Care | Payer: Medicaid Other | Attending: Urgent Care | Admitting: Urgent Care

## 2022-11-22 DIAGNOSIS — N898 Other specified noninflammatory disorders of vagina: Secondary | ICD-10-CM | POA: Insufficient documentation

## 2022-11-22 DIAGNOSIS — M542 Cervicalgia: Secondary | ICD-10-CM | POA: Insufficient documentation

## 2022-11-22 DIAGNOSIS — R5383 Other fatigue: Secondary | ICD-10-CM | POA: Diagnosis not present

## 2022-11-22 DIAGNOSIS — R7989 Other specified abnormal findings of blood chemistry: Secondary | ICD-10-CM | POA: Insufficient documentation

## 2022-11-22 DIAGNOSIS — T839XXA Unspecified complication of genitourinary prosthetic device, implant and graft, initial encounter: Secondary | ICD-10-CM | POA: Insufficient documentation

## 2022-11-22 DIAGNOSIS — R102 Pelvic and perineal pain: Secondary | ICD-10-CM | POA: Insufficient documentation

## 2022-11-22 LAB — POCT URINALYSIS DIP (MANUAL ENTRY)
Glucose, UA: NEGATIVE mg/dL
Ketones, POC UA: NEGATIVE mg/dL
Leukocytes, UA: NEGATIVE
Nitrite, UA: NEGATIVE
Protein Ur, POC: 100 mg/dL — AB
Spec Grav, UA: 1.025 (ref 1.010–1.025)
Urobilinogen, UA: 1 E.U./dL
pH, UA: 6.5 (ref 5.0–8.0)

## 2022-11-22 LAB — TSH: TSH: 0.105 u[IU]/mL — ABNORMAL LOW (ref 0.350–4.500)

## 2022-11-22 LAB — T4, FREE: Free T4: 1.11 ng/dL (ref 0.61–1.12)

## 2022-11-22 LAB — CK: Total CK: 97 U/L (ref 38–234)

## 2022-11-22 MED ORDER — MUPIROCIN 2 % EX OINT: 1.0000 | TOPICAL_OINTMENT | Freq: Three times a day (TID) | CUTANEOUS | 0 refills | Status: DC

## 2022-11-22 MED ORDER — MELOXICAM 7.5 MG PO TABS: 7.5000 mg | ORAL_TABLET | Freq: Every day | ORAL | 0 refills | Status: DC

## 2022-11-22 NOTE — Discharge Instructions (Addendum)
Please call your gynecologist TODAY who placed your IUD, this needs to be further evaluated as I cannot see the strings.  We drew labs on you today to assess the cause of your symptoms. We also obtained urine tests and vaginal swabs to test for possible bacteria.  We will start you on a medication to help with your pelvic cramping. Please also apply to topical antibiotic ointment to the affected area of your vulva. Please ensure you are adequately hydrated and drink plenty of water.  Please call your PCP to schedule a follow up / workup. Consider ultrasound imaging of your neck given your currently symptoms and labs.

## 2022-11-22 NOTE — ED Provider Notes (Signed)
[facility]    CSN: [id number] Arrival date & time: 11/22/22  4098      History   Chief Complaint Chief Complaint  Patient presents with   Abdominal Pain   Dysuria    HPI Chelsea Branch is a 57 y.o. female.   Pleasant 56 year old female presents today due to concerns of pelvic discomfort.  She states for the past 3 weeks she has had 3 to 4 days weekly of pelvic cramping.  She notes some discomfort with urination, but denies frequency or hesitancy.  She also notes some vaginal discharge.  She denies any vaginal itching, hematuria, flank pain, fever.  She states she recently had sex with her husband, but is normally not sexually active.  She states she has an IUD in place, but does not know what kind and does not recall when it was placed.  IUD was placed due to history of menorrhagia and fibroids.   Patient additionally is concerned about her generalized fatigue and body aches.  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  She saw her PCP a little over a month ago who ran some blood tests for her fatigue.  Her TSH was 0.26.  She has a history of abnormal thyroid test dating back to 2018, but is not currently on any type of thyroid medications.   Abdominal Pain Associated symptoms: dysuria and fatigue   Dysuria Associated symptoms: abdominal pain     Past Medical History:  Diagnosis Date   Abnormal thyroid blood test 03/15/2017   Abnormal uterine bleeding    Alopecia    Anemia    Angina pectoris with normal coronary arteriogram (HCC) 2017   Had + Troponin c/w ? 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 iniatially ?2/2 peripartum in 1994 - improved by 2008 then worsening EF in 2011 back down to EF 25-30%. b. echo 01/21/14 showed mod LVH, [name]; 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   Peripartum cardiomyopathy 1994   Sleep apnea 2015   CPAP 12/2013   Stroke (HCC) 2011   Systolic and diastolic CHF, acute on chronic (HCC) 06/14/2015   Tobacco abuse     Patient Active Problem List   Diagnosis Date Noted   External otitis of both ears due to fungus 10/07/2022   Chronic pain syndrome 12/28/2021   Primary osteoarthritis of both knees 12/28/2021   Spinal stenosis of lumbar region 07/21/2021   Lumbar radiculitis 07/21/2021   COPD (chronic obstructive pulmonary disease) (HCC) 07/21/2021   Mechanical complication due to intrauterine contraceptive device 07/21/2021   Arthropathy of lumbar facet joint 03/14/2019   Degenerative disc disease at L5-S1 level 10/10/2018   Low TSH level 04/26/2016   Abnormal uterine bleeding 04/06/2016   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   History of non-ST elevation myocardial infarction (NSTEMI) 06/14/2015   Homelessness 09/16/2014   Vitamin D deficiency 01/29/2014  OSA (obstructive sleep apnea) 01/08/2014   Chronic combined systolic and diastolic CHF (congestive heart failure) (HCC) 08/14/2013   Essential hypertension    Fibroids 08/09/2012   Menorrhagia 05/08/2012   Female pattern hair loss    HLD (hyperlipidemia) 06/08/2010   CEREBRAL ANEURYSM 06/08/2010   CEREBROVASCULAR ACCIDENT, HX OF 06/08/2010    Past Surgical History:  Procedure Laterality Date   CARDIAC CATHETERIZATION N/A 06/16/2015   Procedure: Left Heart Cath and Coronary Angiography;  Surgeon: Corky Crafts, MD;  Location: [facility] INVASIVE CV LAB;  Service: Cardiovascular;   Laterality: N/A;   CESAREAN SECTION  1992  1994   LOOP RECORDER IMPLANT  ~ 2000   TIBIAL TUBERCLERPLASTY     TUBAL LIGATION  1994    OB History     Gravida  2   Para  2   Term  2   Preterm      AB      Living  3      SAB      IAB      Ectopic      Multiple  1   Live Births  3            Home Medications    Prior to Admission medications   Medication Sig Start Date End Date Taking? Authorizing Provider  albuterol (VENTOLIN HFA) 108 (90 Base) MCG/ACT inhaler Inhale 2 puffs into the lungs every 4 (four) hours as needed for wheezing or shortness of breath. 09/19/22  Yes White, Adrienne R, NP  diclofenac Sodium (VOLTAREN) 1 % GEL Apply 4 g topically 4 (four) times daily. 10/06/22  Yes Storm Frisk, MD  ketoconazole (NIZORAL) 2 % shampoo Apply 1 Application topically 2 (two) times a week. 10/07/22  Yes Storm Frisk, MD  levocetirizine (XYZAL) 5 MG tablet Take 1 tablet (5 mg total) by mouth every evening. 10/06/22 04/04/23 Yes Storm Frisk, MD  meloxicam (MOBIC) 7.5 MG tablet Take 1 tablet (7.5 mg total) by mouth daily. 11/22/22  Yes Deaunna Olarte L, PA  metoprolol succinate (TOPROL-XL) 50 MG 24 hr tablet TAKE 1 TABLET (50 MG TOTAL) BY MOUTH DAILY. PLEASE KEEP UPCOMING APPOINTMENT IN APRIL 2024 FOR FUTURE REFILLS. THANK YOU 10/04/22  Yes [name], PA-C  mupirocin ointment (BACTROBAN) 2 % Apply 1 Application topically 3 (three) times daily. To area on vagina 11/22/22  Yes Donella Pascarella L, PA  potassium chloride SA (KLOR-CON M20) 20 MEQ tablet TAKE 1-2 TABS BY MOUTH AS DIRECTED. TAKE 2 TABLETS IN THE MORNING AND 1 TABLET IN THE EVENING 10/06/22  Yes Storm Frisk, MD  PROGESTERONE PO Take by mouth.   Yes [provider]  sacubitril-valsartan (ENTRESTO) 24-26 MG Take 1 tablet by mouth 2 (two) times daily. 10/06/22  Yes Storm Frisk, MD  spironolactone (ALDACTONE) 25 MG tablet Take 0.5 tablets (12.5 mg total) by mouth daily. 10/06/22   Yes Storm Frisk, MD  tiZANidine (ZANAFLEX) 4 MG tablet Take 1 tablet (4 mg total) by mouth every 8 (eight) hours as needed for muscle spasms. DO NOT DRINK ALCOHOL OR DRIVE WHILE TAKING THIS MEDICATION 10/06/22  Yes Storm Frisk, MD  torsemide (DEMADEX) 20 MG tablet Take 1 tablet (20 mg total) by mouth daily. 10/06/22  Yes Storm Frisk, MD  Blood Pressure Monitoring (BLOOD PRESSURE KIT) DEVI Use to measure blood pressure twice daily 07/22/21   Storm Frisk, MD  Fluocinolone Acetonide (DERMOTIC) 0.01 % OIL Place 5 drops in  ear(s) every 12 (twelve) hours. 10/06/22   Storm Frisk, MD  ibuprofen (ADVIL) 800 MG tablet Take 1 tablet (800 mg total) by mouth every 8 (eight) hours as needed for up to 30 doses for fever, headache, mild pain or moderate pain. 10/06/22   Storm Frisk, MD  magnesium oxide (MAG-OX) 400 MG tablet TAKE 1 CAPSULE (400 MG TOTAL) BY MOUTH 2 (TWO) TIMES DAILY. 10/06/22   Storm Frisk, MD  Misc. Devices MISC Please provide patient with insurance approved straight cane 09/29/18   Claiborne Rigg, NP  Misc. Devices MISC Single-point cane with adjustable height x1, shower chair x1, transfer bench x1.  Diagnosis congestive heart failure, degenerative disc disease of the lumbar spine. 11/23/18   [name], MD  DULoxetine (CYMBALTA) 30 MG capsule Take 1 capsule (30 mg total) by mouth daily. 12/07/18 12/10/19  [name], MD  famotidine (PEPCID) 20 MG tablet Take 1 tablet (20 mg total) 2 (two) times daily by mouth. 04/10/17 11/03/19  Elisha Ponder, PA-C    Family History Family History  Problem Relation Age of Onset   Cancer Maternal Grandmother        uterine   Hypertension Sister    Healthy Mother    Other Neg Hx    Heart disease Neg Hx     Social History Social History   Tobacco Use   Smoking status: Former    Packs/day: 0.10    Years: 30.00    Additional pack years: 0.00    Total pack years: 3.00    Types: Cigarettes    Quit date:  06/03/2015    Years since quitting: 7.4   Smokeless tobacco: Never   Tobacco comments:    Pt. stated she stopped smoking a year ago. 09/29/2018  Vaping Use   Vaping Use: Never used  Substance Use Topics   Alcohol use: No    Alcohol/week: 0.0 standard drinks of alcohol   Drug use: No     Allergies   Ace inhibitors   Review of Systems Review of Systems  Constitutional:  Positive for fatigue.  Gastrointestinal:  Positive for abdominal pain.  Genitourinary:  Positive for dysuria.  Musculoskeletal:  Positive for myalgias.  As per HPI   Physical Exam Triage Vital Signs ED Triage Vitals  Enc Vitals Group     BP 11/22/22 0909 (!) 159/89     Pulse Rate 11/22/22 0909 75     Resp 11/22/22 0909 18     Temp 11/22/22 0909 98 F (36.7 C)     Temp Source 11/22/22 0909 Oral     SpO2 11/22/22 0909 96 %     Weight --      Height --      Head Circumference --      Peak Flow --      Pain Score 11/22/22 0911 7     Pain Loc --      Pain Edu? --      Excl. in GC? --    No data found.  Updated Vital Signs BP (!) 159/89 Comment: states has not taken HTN med this AM  Pulse 75   Temp 98 F (36.7 C) (Oral)   Resp 18   SpO2 96%   Visual Acuity Right Eye Distance:   Left Eye Distance:   Bilateral Distance:    Right Eye Near:   Left Eye Near:    Bilateral Near:     Physical Exam Vitals and nursing note reviewed.  Constitutional:      General: She is not in acute distress.    Appearance: She is well-developed. She is obese. She is not ill-appearing, toxic-appearing or diaphoretic.  HENT:     Head: Normocephalic and atraumatic.     Mouth/Throat:     Mouth: Mucous membranes are moist.     Pharynx: Oropharynx is clear. No pharyngeal swelling or oropharyngeal exudate.  Eyes:     General: No scleral icterus.    Extraocular Movements: Extraocular movements intact.     Pupils: Pupils are equal, round, and reactive to light.  Neck:     Thyroid: No thyroid mass, thyromegaly or  thyroid tenderness.     Comments: Tenderness to SCM L>R, no appreciable thyroid tenderness or lymphadenopathy Cardiovascular:     Rate and Rhythm: Normal rate and regular rhythm.  Pulmonary:     Effort: Pulmonary effort is normal. No respiratory distress.  Abdominal:     General: Abdomen is flat. Bowel sounds are normal.     Palpations: Abdomen is soft. There is no hepatomegaly or splenomegaly.     Tenderness: There is abdominal tenderness in the suprapubic area. There is no right CVA tenderness, left CVA tenderness, guarding or rebound. Negative signs include Murphy's sign, Rovsing's sign, McBurney's sign and psoas sign.     Hernia: No hernia is present.  Genitourinary:    Vagina: Normal. No signs of injury and foreign body. No vaginal discharge, erythema, tenderness or bleeding.     Cervix: Normal.     Adnexa: Right adnexa normal and left adnexa normal.       Right: No mass, tenderness or fullness.         Left: No mass, tenderness or fullness.       Comments: Scant appearing physiologic discharge No IUD strings visualized or palpated Single draining lesion to R superior vulva, appearance of infected hair Lymphadenopathy:     Cervical: No cervical adenopathy.  Skin:    General: Skin is warm and dry.     Coloration: Skin is not cyanotic or jaundiced.  Neurological:     General: No focal deficit present.     Mental Status: She is alert and oriented to person, place, and time.     Cranial Nerves: No cranial nerve deficit.      UC Treatments / Results  Labs (all labs ordered are listed, but only abnormal results are displayed) Labs Reviewed  POCT URINALYSIS DIP (MANUAL ENTRY) - Abnormal; Notable for the following components:      Result Value   Bilirubin, UA small (*)    Blood, UA trace-intact (*)    Protein Ur, POC =100 (*)    All other components within normal limits  URINE CULTURE  TSH  T3, FREE  T4, FREE  LYME DISEASE SEROLOGY W/REFLEX  CK  CERVICOVAGINAL ANCILLARY  ONLY    EKG   Radiology No results found.  Procedures Procedures (including critical care time)  Medications Ordered in UC Medications - No data to display  Initial Impression / Assessment and Plan / UC Course  I have reviewed the triage vital signs and the nursing notes.  Pertinent labs & imaging results that were available during my care of the patient were reviewed by me and considered in my medical decision making (see chart for details).     Pelvic cramping - non-specific. Pt with hx of IUD placed, she does not know which one or when. I do not see any strings on physical exam. UA  does not reveal UTI, but will send in cx for confirmation. Aptima swab collected. No s/sx of PID on exam. Will do short course of NSAID to help with inflammation, must call gynecologist today to schedule further follow up/ review of possible IUD retained. IUD - as above Fatigue - non-specific. Previously discussed with her PCP. She does work in a very Agricultural consultant. Also reports being outside extensively, unknown tick exposure. Will draw additional labs to rule out myositis, lymes Vaginal lesion - topical mupirocin appears adequate. The area is self draining Abnormal TSH - pt has had this since 2018. Not on meds. She c/o neck tension to me, will redraw her labs including free hormones. Should follow up with PCP to see if TSI should be obtained, or possible thyroid US. Neck pain - seems muscular. Encouraged adequate hydration with water. Labs obtained. NSAID x 7-14 days. Warm compresses.   Final Clinical Impressions(s) / UC Diagnoses   Final diagnoses:  Pelvic cramping  Complication of intrauterine device (IUD), unspecified complication, initial encounter (HCC)  Fatigue, unspecified type  Vaginal lesion  Abnormal TSH  Neck pain     Discharge Instructions      Please call your gynecologist TODAY who placed your IUD, this needs to be further evaluated as I cannot see the  strings.  We drew labs on you today to assess the cause of your symptoms. We also obtained urine tests and vaginal swabs to test for possible bacteria.  We will start you on a medication to help with your pelvic cramping. Please also apply to topical antibiotic ointment to the affected area of your vulva. Please ensure you are adequately hydrated and drink plenty of water.  Please call your PCP to schedule a follow up / workup. Consider ultrasound imaging of your neck given your currently symptoms and labs.     ED Prescriptions     Medication Sig Dispense Auth. Provider   meloxicam (MOBIC) 7.5 MG tablet Take 1 tablet (7.5 mg total) by mouth daily. 10 tablet Zarra Geffert L, PA   mupirocin ointment (BACTROBAN) 2 % Apply 1 Application topically 3 (three) times daily. To area on vagina 22 g Tiann Saha L, PA      PDMP not reviewed this encounter.   Maretta Bees, [place] 11/22/22 1019

## 2022-11-22 NOTE — ED Triage Notes (Addendum)
C/O intermittent generalized abd "soreness" with some nausea x approx 3 wks. C/O some transient nausea and dysuria. C/O foul vaginal odor - states has concern for STIs because she had intercourse with spouse who "has been gone for 4 yrs". Reports working in hot condition.

## 2022-11-23 LAB — URINE CULTURE: Culture: NO GROWTH

## 2022-11-23 LAB — CERVICOVAGINAL ANCILLARY ONLY
Bacterial Vaginitis (gardnerella): POSITIVE — AB
Candida Glabrata: NEGATIVE
Candida Vaginitis: NEGATIVE
Chlamydia: NEGATIVE
Comment: NEGATIVE
Comment: NEGATIVE
Comment: NEGATIVE
Comment: NEGATIVE
Comment: NEGATIVE
Comment: NORMAL
Neisseria Gonorrhea: NEGATIVE
Trichomonas: NEGATIVE

## 2022-11-23 LAB — LYME DISEASE SEROLOGY W/REFLEX: Lyme Total Antibody EIA: NEGATIVE

## 2022-11-23 LAB — T3, FREE: T3, Free: 3.3 pg/mL (ref 2.0–4.4)

## 2022-11-24 ENCOUNTER — Telehealth (HOSPITAL_COMMUNITY): Payer: Self-pay | Admitting: Emergency Medicine

## 2022-11-24 MED ORDER — METRONIDAZOLE 500 MG PO TABS
500.0000 mg | ORAL_TABLET | Freq: Two times a day (BID) | ORAL | 0 refills | Status: DC
Start: 2022-11-24 — End: 2023-01-12

## 2022-11-26 ENCOUNTER — Telehealth (HOSPITAL_COMMUNITY): Payer: Self-pay | Admitting: Emergency Medicine

## 2022-11-26 NOTE — Telephone Encounter (Signed)
Patient returned third call and we were able to review her recent UC results and medication, no questions at this time

## 2023-01-03 ENCOUNTER — Other Ambulatory Visit: Payer: Self-pay | Admitting: Critical Care Medicine

## 2023-01-04 NOTE — Telephone Encounter (Signed)
Requested Prescriptions  Pending Prescriptions Disp Refills   potassium chloride SA (KLOR-CON M20) 20 MEQ tablet [Pharmacy Med Name: KLOR-CON M20 TABLET] 270 tablet 0    Sig: TAKE 2 TABLETS IN THE MORNING AND 1 TABLET IN THE EVENING AS DIRECTED     Endocrinology:  Minerals - Potassium Supplementation Passed - 01/03/2023  8:30 AM      Passed - K in normal range and within 360 days    Potassium  Date Value Ref Range Status  10/04/2022 3.5 3.5 - 5.2 mmol/L Final         Passed - Cr in normal range and within 360 days    Creat  Date Value Ref Range Status  04/06/2016 0.82 0.50 - 1.05 mg/dL Final    Comment:      For patients > or = 58 years of age: The upper reference limit for Creatinine is approximately 13% higher for people identified as African-American.      Creatinine, Ser  Date Value Ref Range Status  10/04/2022 0.70 0.57 - 1.00 mg/dL Final         Passed - Valid encounter within last 12 months    Recent Outpatient Visits           3 months ago Essential hypertension   [place] [facility] Storm Frisk, MD   1 year ago Essential hypertension   [place] [facility] Storm Frisk, MD   1 year ago Chronic systolic congestive heart failure [facility])   [place] [facility] Storm Frisk, MD   4 years ago Vertigo   [place] [facility] [name], MD   4 years ago Nonischemic dilated cardiomyopathy [facility])    [facility] [name], MD       Future Appointments             In 1 week Storm Frisk, MD [facility]   In 3 months [name], MD [id number]

## 2023-01-11 ENCOUNTER — Ambulatory Visit: Payer: Medicaid Other | Admitting: Critical Care Medicine

## 2023-01-12 ENCOUNTER — Ambulatory Visit: Payer: Medicaid Other | Attending: Critical Care Medicine | Admitting: Critical Care Medicine

## 2023-01-12 ENCOUNTER — Encounter: Payer: Self-pay | Admitting: Critical Care Medicine

## 2023-01-12 ENCOUNTER — Ambulatory Visit: Payer: Medicaid Other | Admitting: Critical Care Medicine

## 2023-01-12 ENCOUNTER — Other Ambulatory Visit (HOSPITAL_COMMUNITY)
Admission: RE | Admit: 2023-01-12 | Discharge: 2023-01-12 | Disposition: A | Discharge status: Home / Self Care | Payer: Medicaid Other | Source: Ambulatory Visit | Attending: Critical Care Medicine | Admitting: Critical Care Medicine

## 2023-01-12 VITALS — BP 129/80 | HR 68 | Wt 201.4 lb

## 2023-01-12 DIAGNOSIS — N898 Other specified noninflammatory disorders of vagina: Secondary | ICD-10-CM

## 2023-01-12 DIAGNOSIS — I420 Dilated cardiomyopathy: Secondary | ICD-10-CM | POA: Diagnosis not present

## 2023-01-12 DIAGNOSIS — N939 Abnormal uterine and vaginal bleeding, unspecified: Secondary | ICD-10-CM

## 2023-01-12 DIAGNOSIS — M5416 Radiculopathy, lumbar region: Secondary | ICD-10-CM

## 2023-01-12 DIAGNOSIS — E782 Mixed hyperlipidemia: Secondary | ICD-10-CM | POA: Diagnosis not present

## 2023-01-12 DIAGNOSIS — D219 Benign neoplasm of connective and other soft tissue, unspecified: Secondary | ICD-10-CM

## 2023-01-12 DIAGNOSIS — Z5900 Homelessness unspecified: Secondary | ICD-10-CM | POA: Diagnosis not present

## 2023-01-12 DIAGNOSIS — I10 Essential (primary) hypertension: Secondary | ICD-10-CM

## 2023-01-12 DIAGNOSIS — R109 Unspecified abdominal pain: Secondary | ICD-10-CM | POA: Diagnosis not present

## 2023-01-12 DIAGNOSIS — J432 Centrilobular emphysema: Secondary | ICD-10-CM | POA: Diagnosis not present

## 2023-01-12 DIAGNOSIS — R7989 Other specified abnormal findings of blood chemistry: Secondary | ICD-10-CM | POA: Diagnosis not present

## 2023-01-12 DIAGNOSIS — T8339XD Other mechanical complication of intrauterine contraceptive device, subsequent encounter: Secondary | ICD-10-CM | POA: Diagnosis not present

## 2023-01-12 DIAGNOSIS — L658 Other specified nonscarring hair loss: Secondary | ICD-10-CM

## 2023-01-12 LAB — POCT URINALYSIS DIP (CLINITEK)
Blood, UA: NEGATIVE
Glucose, UA: NEGATIVE mg/dL
Leukocytes, UA: NEGATIVE
Nitrite, UA: NEGATIVE
POC PROTEIN,UA: 100 — AB
Spec Grav, UA: 1.025 (ref 1.010–1.025)
Urobilinogen, UA: 1 U/dL
pH, UA: 6 (ref 5.0–8.0)

## 2023-01-12 MED ORDER — MAGNESIUM OXIDE 400 MG PO TABS: ORAL_TABLET | ORAL | 2 refills | Status: DC

## 2023-01-12 MED ORDER — ATORVASTATIN CALCIUM 10 MG PO TABS
10.0000 mg | ORAL_TABLET | Freq: Every day | ORAL | 3 refills | Status: DC
Start: 2023-01-12 — End: 2023-04-13

## 2023-01-12 MED ORDER — TORSEMIDE 20 MG PO TABS: 20.0000 mg | ORAL_TABLET | Freq: Every day | ORAL | 3 refills | Status: DC

## 2023-01-12 MED ORDER — SACUBITRIL-VALSARTAN 24-26 MG PO TABS: 1.0000 | ORAL_TABLET | Freq: Two times a day (BID) | ORAL | 4 refills | Status: DC

## 2023-01-12 MED ORDER — DICLOFENAC SODIUM 1 % EX GEL: 4.0000 g | Freq: Four times a day (QID) | CUTANEOUS | 0 refills | Status: DC

## 2023-01-12 MED ORDER — TIZANIDINE HCL 4 MG PO TABS: 4.0000 mg | ORAL_TABLET | Freq: Three times a day (TID) | ORAL | 0 refills | Status: DC | PRN

## 2023-01-12 MED ORDER — POTASSIUM CHLORIDE CRYS ER 20 MEQ PO TBCR: EXTENDED_RELEASE_TABLET | ORAL | 0 refills | Status: DC

## 2023-01-12 MED ORDER — SPIRONOLACTONE 25 MG PO TABS: 12.5000 mg | ORAL_TABLET | Freq: Every day | ORAL | 2 refills | Status: DC

## 2023-01-12 MED ORDER — LIDOCAINE 5 % EX PTCH: 1.0000 | MEDICATED_PATCH | CUTANEOUS | 0 refills | Status: DC

## 2023-01-12 MED ORDER — METOPROLOL SUCCINATE ER 50 MG PO TB24: 50.0000 mg | ORAL_TABLET | Freq: Every day | ORAL | 3 refills | Status: DC

## 2023-01-12 MED ORDER — LEVOCETIRIZINE DIHYDROCHLORIDE 5 MG PO TABS: 5.0000 mg | ORAL_TABLET | Freq: Every evening | ORAL | 1 refills | Status: DC

## 2023-01-12 MED ORDER — ALBUTEROL SULFATE HFA 108 (90 BASE) MCG/ACT IN AERS: 2.0000 | INHALATION_SPRAY | RESPIRATORY_TRACT | 0 refills | Status: DC | PRN

## 2023-01-12 NOTE — Assessment & Plan Note (Signed)
Controlled at this time continue medications as per heart failure treatment

## 2023-01-12 NOTE — Assessment & Plan Note (Signed)
IUD needs to be removed per gynecology

## 2023-01-12 NOTE — Patient Instructions (Signed)
Medications refilled  Labs today Housing assistance per our case manager Erskine Squibb Return 4 months

## 2023-01-12 NOTE — Assessment & Plan Note (Signed)
Referral to case management

## 2023-01-12 NOTE — Assessment & Plan Note (Signed)
Airway stable at this time continue albuterol

## 2023-01-12 NOTE — Progress Notes (Signed)
Established Patient Office Visit  Subjective   [name], female    DOB: 1965-11-03  Age: 57 y.o. MRN: [id number]  Chief Complaint  Patient presents with   Abdominal Cramping   Medication Refill    57 y.o.F with HTN CHF OSA Copd  Not seen since 09/2021 Multiple ED visits since Today the patient is complaining of bodyaches in both legs difficulty sleeping at night severe fatigue during the day shortness of breath and wheezing mild dry cough extreme fatigue difficulty with memory.  Patient had a prior diagnosis of sleep apnea and a sleep study was done in 2015.  Patient was trialed on CPAP but could not tolerate the mask fit..  Patient also complains of lower extremity pain itching in the ears scalp irritation with alopecia. The patient has a prior history of nonischemic cardiomyopathy with hyperlipidemia NSTEMI and nonobstructive coronary disease 2017. The most recent note by cardiology is documented as below   Cards 10/04/22: [name] is a 57 y.o. 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   Since last being seen in our clinic the patient reports fatigue and malaise for approx 6-8 weeks. This has also been associated with generalized body aches for the past month. She denies any inciting factors such as acute illness or tick bite. She has not seen other providers about this yet. She has been taking her medications as directed. She gets some relief from tylenol. Denies chest pain, dyspnea, vomiting, dizziness, syncope, edema, weight gain, or early satiety.    Review of systems complete and found to be negative unless listed in HPI.    Studies Reviewed:    EKG is ordered today. Personal review shows NSR at 74 bpm   Echo 08/2021 LVEF 60-65%, Grade 1 DD   Physical Exam:  VS:  BP 134/88   Pulse 74   Ht 5\' 8"  (1.727 m)   Wt  202 lb 12.8 oz (92 kg)   SpO2 98%   BMI 30.84 kg/m    Wt Readings from Last 3 Encounters: 10/04/22 202 lb 12.8 oz (92 kg) 10/30/21 210 lb (95.3 kg) 09/23/21 207 lb (93.9 kg)    Electrophysiology Office Note:  Date:  10/04/2022  ID:  [name], DOB 07-28-1965, MRN [id number]   Primary Cardiologist: Sherryl Manges, MD Electrophysiologist: Sherryl Manges, MD     History of Present Illness:  Chelsea Branch is a 57 y.o. 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   Since last being seen in our clinic the patient reports fatigue and malaise for approx 6-8 weeks. This has also been associated with generalized body aches for the past month. She denies any inciting factors such as acute illness or tick bite. She has not seen other providers about this yet. She has been taking her medications as directed. She gets some relief from tylenol. Denies chest pain, dyspnea, vomiting, dizziness, syncope, edema, weight gain, or early satiety.     ASSESSMENT AND PLAN:    NICM HFpEF EF 60-65% 08/2021 Volume status stable on exam NYHA II-III symptoms   HTN Stable on current regimen    Fatigue Generalized weakness Body aches For approx 6-8 weeks with no clear inciting factor.  Denies acute illness or any known contacts with  ticks.  Has not followed up with PCP. Recommended close visit.  Will check BMET, CBC, TSH today. Will also update echo (which she understands would not explain aches)   Follow up with Dr. Graciela Husbands in 6 months   Signed, [name], PA-C    The patient had thyroid function checked was normal blood count metabolic panel was normal and this studies was just done this week  31-Jan-2023 Patient is seen in return follow-up she is homeless at this time she sleeps in her car some she is having increased back and leg pain with this.   She does have an IUD in place and has pelvic and abdominal pain.  She needs follow-up at [name].  She works in a [employer] from 4:30 AM until about 3 PM every day.  He has hypertension today on arrival has been told blood pressure.      Review of Systems  Constitutional:  Negative for chills, diaphoresis, fever, malaise/fatigue and weight loss.  HENT:  Negative for congestion, ear pain, hearing loss, nosebleeds, sore throat and tinnitus.   Eyes:  Negative for blurred vision, photophobia and redness.  Respiratory:  Positive for shortness of breath. Negative for cough, hemoptysis, sputum production, wheezing and stridor.   Cardiovascular:  Positive for leg swelling. Negative for chest pain, palpitations, orthopnea, claudication and PND.  Gastrointestinal:  Negative for abdominal pain, blood in stool, constipation, diarrhea, heartburn, nausea and vomiting.  Genitourinary:  Negative for dysuria, flank pain, frequency, hematuria and urgency.  Musculoskeletal:  Negative for back pain, falls, joint pain, myalgias and neck pain.  Skin:  Negative for itching and rash.  Neurological:  Negative for dizziness, tingling, tremors, sensory change, speech change, focal weakness, seizures, loss of consciousness, weakness and headaches.  Endo/Heme/Allergies:  Negative for environmental allergies and polydipsia. Does not bruise/bleed easily.  Psychiatric/Behavioral:  Negative for depression, memory loss, substance abuse and suicidal ideas. The patient is not nervous/anxious and does not have insomnia.       Objective:     BP 129/80 (BP Location: Right Arm, Patient Position: Sitting, Cuff Size: Normal)   Pulse 68   Wt 201 lb 6.4 oz (91.4 kg)   SpO2 98%   BMI 30.62 kg/m    Physical Exam Vitals reviewed.  Constitutional:      Appearance: Normal appearance. She is well-developed. She is obese. She is not diaphoretic.  HENT:     Head: Normocephalic and atraumatic.     Right Ear:  Tympanic membrane and external ear normal. There is no impacted cerumen.     Left Ear: Tympanic membrane and external ear normal. There is no impacted cerumen.     Nose: Nose normal. No nasal deformity, septal deviation, mucosal edema or rhinorrhea.     Right Sinus: No maxillary sinus tenderness or frontal sinus tenderness.     Left Sinus: No maxillary sinus tenderness or frontal sinus tenderness.     Mouth/Throat:     Mouth: Mucous membranes are moist.     Pharynx: Oropharynx is clear. No oropharyngeal exudate.  Eyes:     General: No scleral icterus.    Conjunctiva/sclera: Conjunctivae normal.     Pupils: Pupils are equal, round, and reactive to light.  Neck:     Thyroid: No thyromegaly.     Vascular: No carotid bruit or JVD.     Trachea: Trachea normal. No tracheal tenderness or tracheal deviation.  Cardiovascular:     Rate and Rhythm: Normal rate and regular rhythm.  Chest Wall: PMI is not displaced.     Pulses: Normal pulses. No decreased pulses.     Heart sounds: Normal heart sounds, S1 normal and S2 normal. Heart sounds not distant. No murmur heard.    No systolic murmur is present.     No diastolic murmur is present.     No friction rub. No gallop. No S3 or S4 sounds.  Pulmonary:     Effort: No tachypnea, accessory muscle usage or respiratory distress.     Breath sounds: No stridor. No decreased breath sounds, wheezing, rhonchi or rales.  Chest:     Chest wall: No tenderness.  Abdominal:     General: Bowel sounds are normal. There is no distension.     Palpations: Abdomen is soft. Abdomen is not rigid.     Tenderness: There is no abdominal tenderness. There is no guarding or rebound.  Musculoskeletal:        General: Normal range of motion.     Cervical back: Normal range of motion and neck supple. No edema, erythema or rigidity. No muscular tenderness. Normal range of motion.  Lymphadenopathy:     Head:     Right side of head: No submental or submandibular adenopathy.      Left side of head: No submental or submandibular adenopathy.     Cervical: No cervical adenopathy.  Skin:    General: Skin is warm and dry.     Coloration: Skin is not pale.     Findings: No rash.     Nails: There is no clubbing.  Neurological:     Mental Status: She is alert and oriented to person, place, and time.     Sensory: No sensory deficit.  Psychiatric:        Speech: Speech normal.        Behavior: Behavior normal.      Results for orders placed or performed in visit on 01/12/23  POCT URINALYSIS DIP (CLINITEK)  Result Value Ref Range   Color, UA straw (A) yellow   Clarity, UA clear clear   Glucose, UA negative negative mg/dL   Bilirubin, UA small (A) negative   Ketones, POC UA trace (5) (A) negative mg/dL   Spec Grav, UA 1.610 9.604 - 1.025   Blood, UA negative negative   pH, UA 6.0 5.0 - 8.0   POC PROTEIN,UA =100 (A) negative, trace   Urobilinogen, UA 1.0 0.2 or 1.0 E.U./dL   Nitrite, UA Negative Negative   Leukocytes, UA Negative Negative      The ASCVD Risk score (Arnett DK, et al., 2019) failed to calculate for the following reasons:   The patient has a prior MI or stroke diagnosis    Assessment & Plan:   Problem List Items Addressed This Visit       Cardiovascular and Mediastinum   Essential hypertension (Chronic)    Controlled at this time continue medications as per heart failure treatment      Relevant Medications   spironolactone (ALDACTONE) 25 MG tablet   torsemide (DEMADEX) 20 MG tablet   metoprolol succinate (TOPROL-XL) 50 MG 24 hr tablet   sacubitril-valsartan (ENTRESTO) 24-26 MG   atorvastatin (LIPITOR) 10 MG tablet   Other Relevant Orders   Comprehensive metabolic panel   CBC with Differential/Platelet   Nonischemic dilated cardiomyopathy (HCC)    Compensated at this time continue with metoprolol Entresto Aldactone and torsemide and get follow-up with cardiology also take potassium supplement check labs  Relevant  Medications   spironolactone (ALDACTONE) 25 MG tablet   torsemide (DEMADEX) 20 MG tablet   metoprolol succinate (TOPROL-XL) 50 MG 24 hr tablet   sacubitril-valsartan (ENTRESTO) 24-26 MG   atorvastatin (LIPITOR) 10 MG tablet     Respiratory   COPD (chronic obstructive pulmonary disease) (HCC)    Airway stable at this time continue albuterol      Relevant Medications   albuterol (VENTOLIN HFA) 108 (90 Base) MCG/ACT inhaler   levocetirizine (XYZAL) 5 MG tablet     Nervous and Auditory   Lumbar radiculitis    Continue with conservative therapy including a lidocaine patch      Relevant Medications   tiZANidine (ZANAFLEX) 4 MG tablet     Musculoskeletal and Integument   Female pattern hair loss    Patient is seen dermatology no therapy offered        Genitourinary   Abnormal uterine bleeding    Follow-up with gynecology        Other   HLD (hyperlipidemia) (Chronic)    Renew atorvastatin for hypercholesterolemia      Relevant Medications   spironolactone (ALDACTONE) 25 MG tablet   torsemide (DEMADEX) 20 MG tablet   metoprolol succinate (TOPROL-XL) 50 MG 24 hr tablet   sacubitril-valsartan (ENTRESTO) 24-26 MG   atorvastatin (LIPITOR) 10 MG tablet   Fibroids (Chronic)   Relevant Orders   Ambulatory referral to Gynecology   Homelessness (Chronic)    Referral to case management      Low TSH level   Relevant Orders   TSH   Mechanical complication due to intrauterine contraceptive device    IUD needs to be removed per gynecology      Relevant Orders   Ambulatory referral to Gynecology   Other Visit Diagnoses     Abdominal cramping    -  Primary   Relevant Orders   POCT URINALYSIS DIP (CLINITEK) (Completed)   Urinalysis   Urine Culture   Ambulatory referral to Gynecology   Vaginal discharge       Relevant Orders   POCT URINALYSIS DIP (CLINITEK) (Completed)   Cervicovaginal ancillary only   Urinalysis   Urine Culture   Ambulatory referral to Gynecology        35 minutes spent due to assessing multiple problems  Return in about 4 months (around 05/14/2023).    Shan Levans, MD

## 2023-01-12 NOTE — Assessment & Plan Note (Signed)
Follow up with gynecology

## 2023-01-12 NOTE — Assessment & Plan Note (Signed)
Patient is seen dermatology no therapy offered

## 2023-01-12 NOTE — Assessment & Plan Note (Signed)
Compensated at this time continue with metoprolol Entresto Aldactone and torsemide and get follow-up with cardiology also take potassium supplement check labs

## 2023-01-12 NOTE — Assessment & Plan Note (Signed)
Renew atorvastatin for hypercholesterolemia

## 2023-01-12 NOTE — Assessment & Plan Note (Signed)
Continue with conservative therapy including a lidocaine patch

## 2023-01-13 ENCOUNTER — Ambulatory Visit: Payer: Self-pay | Admitting: *Deleted

## 2023-01-13 ENCOUNTER — Other Ambulatory Visit: Payer: Self-pay | Admitting: Critical Care Medicine

## 2023-01-13 ENCOUNTER — Encounter: Payer: Self-pay | Admitting: Critical Care Medicine

## 2023-01-13 ENCOUNTER — Telehealth: Payer: Self-pay

## 2023-01-13 DIAGNOSIS — N760 Acute vaginitis: Secondary | ICD-10-CM | POA: Insufficient documentation

## 2023-01-13 DIAGNOSIS — B9689 Other specified bacterial agents as the cause of diseases classified elsewhere: Secondary | ICD-10-CM | POA: Insufficient documentation

## 2023-01-13 LAB — COMPREHENSIVE METABOLIC PANEL
ALT: 20 IU/L (ref 0–32)
AST: 18 IU/L (ref 0–40)
Albumin: 4 g/dL (ref 3.8–4.9)
Alkaline Phosphatase: 86 IU/L (ref 44–121)
BUN/Creatinine Ratio: 18 (ref 9–23)
BUN: 16 mg/dL (ref 6–24)
Bilirubin Total: 0.4 mg/dL (ref 0.0–1.2)
CO2: 25 mmol/L (ref 20–29)
Calcium: 9.3 mg/dL (ref 8.7–10.2)
Chloride: 100 mmol/L (ref 96–106)
Creatinine, Ser: 0.88 mg/dL (ref 0.57–1.00)
Globulin, Total: 2.6 g/dL (ref 1.5–4.5)
Glucose: 90 mg/dL (ref 70–99)
Potassium: 3.1 mmol/L — ABNORMAL LOW (ref 3.5–5.2)
Sodium: 141 mmol/L (ref 134–144)
Total Protein: 6.6 g/dL (ref 6.0–8.5)
eGFR: 77 mL/min/{1.73_m2} (ref >59)

## 2023-01-13 LAB — URINALYSIS
Glucose, UA: NEGATIVE
Leukocytes,UA: NEGATIVE
Nitrite, UA: NEGATIVE
RBC, UA: NEGATIVE
Specific Gravity, UA: >=1.03 — AB (ref 1.005–1.030)
Urobilinogen, Ur: 1 mg/dL (ref 0.2–1.0)
pH, UA: 6 (ref 5.0–7.5)

## 2023-01-13 LAB — CBC WITH DIFFERENTIAL/PLATELET
Basophils Absolute: 0 10*3/uL (ref 0.0–0.2)
Basos: 0 %
EOS (ABSOLUTE): 0.1 10*3/uL (ref 0.0–0.4)
Eos: 1 %
Hematocrit: 43.7 % (ref 34.0–46.6)
Hemoglobin: 14.8 g/dL (ref 11.1–15.9)
Immature Grans (Abs): 0 10*3/uL (ref 0.0–0.1)
Immature Granulocytes: 0 %
Lymphocytes Absolute: 1.9 10*3/uL (ref 0.7–3.1)
Lymphs: 37 %
MCH: 29.4 pg (ref 26.6–33.0)
MCHC: 33.9 g/dL (ref 31.5–35.7)
MCV: 87 fL (ref 79–97)
Monocytes Absolute: 0.4 10*3/uL (ref 0.1–0.9)
Monocytes: 7 %
Neutrophils Absolute: 2.9 10*3/uL (ref 1.4–7.0)
Neutrophils: 55 %
Platelets: 215 10*3/uL (ref 150–450)
RBC: 5.03 x10E6/uL (ref 3.77–5.28)
RDW: 13.8 % (ref 11.7–15.4)
WBC: 5.2 10*3/uL (ref 3.4–10.8)

## 2023-01-13 LAB — CERVICOVAGINAL ANCILLARY ONLY
Bacterial Vaginitis (gardnerella): POSITIVE — AB
Candida Glabrata: NEGATIVE
Candida Vaginitis: NEGATIVE
Chlamydia: NEGATIVE
Comment: NEGATIVE
Comment: NEGATIVE
Comment: NEGATIVE
Comment: NEGATIVE
Comment: NEGATIVE
Comment: NORMAL
Neisseria Gonorrhea: NEGATIVE
Trichomonas: NEGATIVE

## 2023-01-13 LAB — TSH: TSH: 0.384 u[IU]/mL — ABNORMAL LOW (ref 0.450–4.500)

## 2023-01-13 MED ORDER — METRONIDAZOLE 500 MG PO TABS: 500.0000 mg | ORAL_TABLET | Freq: Three times a day (TID) | ORAL | 0 refills | Status: AC

## 2023-01-13 NOTE — Progress Notes (Signed)
Let patient know she has bacteria in the vagina I sent an antibiotic to the pharmacy

## 2023-01-13 NOTE — Telephone Encounter (Signed)
Pt was called and vm was left, Information has been sent to nurse pool.   

## 2023-01-13 NOTE — Telephone Encounter (Signed)
-----   Message from Shan Levans sent at 01/13/2023  6:06 AM EDT ----- Let pt know urine is normal no infectionl  liver kidney is normal thyroid is stable blood count normal  stay on potassium supplement refill given

## 2023-01-13 NOTE — Progress Notes (Signed)
Let pt know urine is normal no infectionl  liver kidney is normal thyroid is stable blood count normal  stay on potassium supplement refill given

## 2023-01-13 NOTE — Telephone Encounter (Signed)
Pt hung up or line disconnected while agent was giving me her information.  She was calling in for her lab results. I called her back and got her voicemail.   I left a message for her to call [facility] back.

## 2023-01-14 ENCOUNTER — Telehealth: Payer: Self-pay

## 2023-01-14 LAB — URINE CULTURE

## 2023-01-14 NOTE — Telephone Encounter (Signed)
Pt was called and vm was left, Information has been sent to nurse pool.   

## 2023-01-14 NOTE — Telephone Encounter (Signed)
-----   Message from Shan Levans sent at 01/13/2023  2:38 PM EDT ----- Let patient know she has bacteria in the vagina I sent an antibiotic to the pharmacy

## 2023-01-31 ENCOUNTER — Other Ambulatory Visit: Payer: Self-pay | Admitting: Critical Care Medicine

## 2023-02-01 ENCOUNTER — Encounter (HOSPITAL_COMMUNITY): Payer: Self-pay

## 2023-02-01 ENCOUNTER — Ambulatory Visit (HOSPITAL_COMMUNITY)
Admission: EM | Admit: 2023-02-01 | Discharge: 2023-02-01 | Disposition: A | Discharge status: Home / Self Care | Payer: Medicaid Other | Attending: Nurse Practitioner | Admitting: Nurse Practitioner

## 2023-02-01 DIAGNOSIS — R112 Nausea with vomiting, unspecified: Secondary | ICD-10-CM | POA: Diagnosis not present

## 2023-02-01 DIAGNOSIS — R1084 Generalized abdominal pain: Secondary | ICD-10-CM

## 2023-02-01 LAB — CBC
HCT: 49.9 % — ABNORMAL HIGH (ref 36.0–46.0)
Hemoglobin: 16.2 g/dL — ABNORMAL HIGH (ref 12.0–15.0)
MCH: 28.3 pg (ref 26.0–34.0)
MCHC: 32.5 g/dL (ref 30.0–36.0)
MCV: 87.2 fL (ref 80.0–100.0)
Platelets: 239 10*3/uL (ref 150–400)
RBC: 5.72 MIL/uL — ABNORMAL HIGH (ref 3.87–5.11)
RDW: 13.1 % (ref 11.5–15.5)
WBC: 5.3 10*3/uL (ref 4.0–10.5)
nRBC: 0 % (ref 0.0–0.2)

## 2023-02-01 LAB — COMPREHENSIVE METABOLIC PANEL
ALT: 22 U/L (ref 0–44)
AST: 18 U/L (ref 15–41)
Albumin: 3.4 g/dL — ABNORMAL LOW (ref 3.5–5.0)
Alkaline Phosphatase: 68 U/L (ref 38–126)
Anion gap: 15 (ref 5–15)
BUN: 8 mg/dL (ref 6–20)
CO2: 26 mmol/L (ref 22–32)
Calcium: 9.2 mg/dL (ref 8.9–10.3)
Chloride: 99 mmol/L (ref 98–111)
Creatinine, Ser: 0.7 mg/dL (ref 0.44–1.00)
GFR, Estimated: >60 mL/min (ref >60)
Glucose, Bld: 100 mg/dL — ABNORMAL HIGH (ref 70–99)
Potassium: 3.4 mmol/L — ABNORMAL LOW (ref 3.5–5.1)
Sodium: 140 mmol/L (ref 135–145)
Total Bilirubin: 0.8 mg/dL (ref 0.3–1.2)
Total Protein: 7.2 g/dL (ref 6.5–8.1)

## 2023-02-01 LAB — LIPASE, BLOOD: Lipase: 28 U/L (ref 11–51)

## 2023-02-01 LAB — POCT FASTING CBG KUC MANUAL ENTRY: POCT Glucose (KUC): 123 mg/dL — AB (ref 70–99)

## 2023-02-01 MED ORDER — ACETAMINOPHEN 325 MG PO TABS
ORAL_TABLET | ORAL | Status: AC
  Filled 2023-02-01: qty 3

## 2023-02-01 MED ORDER — ONDANSETRON 4 MG PO TBDP
4.0000 mg | ORAL_TABLET | Freq: Once | ORAL | Status: AC
  Administered 2023-02-01: 4 mg via ORAL

## 2023-02-01 MED ORDER — ACETAMINOPHEN 325 MG PO TABS
975.0000 mg | ORAL_TABLET | Freq: Once | ORAL | Status: AC
  Administered 2023-02-01: 975 mg via ORAL

## 2023-02-01 MED ORDER — ONDANSETRON 4 MG PO TBDP: 4.0000 mg | ORAL_TABLET | Freq: Three times a day (TID) | ORAL | 0 refills | Status: DC | PRN

## 2023-02-01 MED ORDER — ONDANSETRON 4 MG PO TBDP
ORAL_TABLET | ORAL | Status: AC
  Filled 2023-02-01: qty 1

## 2023-02-01 NOTE — ED Triage Notes (Signed)
Pt c/o lower abdominal cramping, n/v, constipation, and weakness since after eating a bologna sandwich Sunday afternoon. States able to keep fluid down. States taking tums with no relief.

## 2023-02-01 NOTE — ED Provider Notes (Signed)
[facility]    CSN: [id number] Arrival date & time: 02/01/23  0846      History   Chief Complaint Chief Complaint  Patient presents with   Abdominal Pain   Emesis    HPI Chelsea Branch is a 57 y.o. female.   Patient today with abdominal pain ongoing for the past couple of days.  Reports the pain is constant, currently 9 out of 10 and cramping.  Reports it began after eating bologna and she has had food poisoning from bologna before.  She denies fevers, however has had some nausea and vomiting.  Reports 3 episodes of vomiting this morning, has been unable to keep down any water or fluids so far today.  Has felt a little bit dizzy/lightheaded since symptoms began.  No diarrhea, constipation, blood in her stool, blood in the vomit.  No dysuria, urinary frequency or urgency or hematuria.  No pelvic pain or vaginal discharge.  Reports last normal bowel movement was 2 days ago.  History of cesarean section, however no other abdominal surgeries.  Patient tried Tums morning without much improvement.    Past Medical History:  Diagnosis Date   Abnormal thyroid blood test 03/15/2017   Abnormal uterine bleeding    Alopecia    Anemia    Angina pectoris with normal coronary arteriogram (HCC) 2017   Had + Troponin c/w ? 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 iniatially ?2/2 peripartum in 1994 - improved by 2008 then worsening EF in 2011 back down to EF 25-30%. b. echo 01/21/14 showed mod LVH, [name]; 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   Peripartum cardiomyopathy 1994   Sleep apnea 2015   CPAP 12/2013   Stroke (HCC) 2011   Systolic and diastolic CHF, acute on chronic (HCC) 06/14/2015   Tobacco abuse     Patient Active Problem List   Diagnosis Date Noted   Bacterial vaginitis 01/13/2023   External otitis of both ears due to fungus 10/07/2022   Chronic pain syndrome 12/28/2021   Primary osteoarthritis of both knees 12/28/2021   Spinal stenosis of lumbar region 07/21/2021   Lumbar radiculitis 07/21/2021   COPD (chronic obstructive pulmonary disease) (HCC) 07/21/2021   Mechanical complication due to intrauterine contraceptive device 07/21/2021   Arthropathy of lumbar facet joint 03/14/2019   Degenerative disc disease at L5-S1 level 10/10/2018   Low TSH level 04/26/2016   Abnormal uterine bleeding 04/06/2016   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   History of non-ST elevation myocardial infarction (NSTEMI) 06/14/2015   Homelessness 09/16/2014   Vitamin D deficiency 01/29/2014   OSA (obstructive sleep apnea) 01/08/2014   Chronic combined systolic and diastolic CHF (congestive heart failure) (HCC) 08/14/2013   Essential hypertension    Fibroids 08/09/2012   Menorrhagia 05/08/2012   Female pattern hair loss    HLD (hyperlipidemia) 06/08/2010   CEREBRAL ANEURYSM 06/08/2010   CEREBROVASCULAR ACCIDENT, HX OF 06/08/2010    Past Surgical History:  Procedure Laterality  Date   CARDIAC CATHETERIZATION N/A 06/16/2015   Procedure: Left Heart Cath and Coronary Angiography;  Surgeon: Corky Crafts, MD;  Location: [facility] INVASIVE CV LAB;  Service: Cardiovascular;  Laterality: N/A;   CESAREAN SECTION  1992  1994   LOOP RECORDER IMPLANT  ~ 2000   TIBIAL TUBERCLERPLASTY     TUBAL LIGATION  1994    OB History     Gravida  2   Para  2   Term  2   Preterm      AB      Living  3      SAB      IAB      Ectopic      Multiple  1   Live Births  3             Home Medications    Prior to Admission medications   Medication Sig Start Date End Date Taking? Authorizing Provider  ondansetron (ZOFRAN-ODT) 4 MG disintegrating tablet Take 1 tablet (4 mg total) by mouth every 8 (eight) hours as needed for nausea or vomiting. 02/01/23  Yes Valentino Nose, NP  albuterol (VENTOLIN HFA) 108 (90 Base) MCG/ACT inhaler Inhale 2 puffs into the lungs every 4 (four) hours as needed for wheezing or shortness of breath. 01/12/23   Storm Frisk, MD  atorvastatin (LIPITOR) 10 MG tablet Take 1 tablet (10 mg total) by mouth daily. 01/12/23   Storm Frisk, MD  Blood Pressure Monitoring (BLOOD PRESSURE KIT) DEVI Use to measure blood pressure twice daily 07/22/21   Storm Frisk, MD  diclofenac Sodium (VOLTAREN) 1 % GEL Apply 4 g topically 4 (four) times daily. 01/12/23   Storm Frisk, MD  Fluocinolone Acetonide (DERMOTIC) 0.01 % OIL Place 5 drops in ear(s) every 12 (twelve) hours. 10/06/22   Storm Frisk, MD  ibuprofen (ADVIL) 800 MG tablet TAKE 1 TABLET EVERY 8 HRS AS NEEDED FOR UPTO 30 DOSES FOR FEVER,HEADACHE,MILD PAIN OR MODERATE PAIN 01/31/23   Storm Frisk, MD  levocetirizine (XYZAL) 5 MG tablet Take 1 tablet (5 mg total) by mouth every evening. 01/12/23 07/11/23  Storm Frisk, MD  lidocaine (LIDODERM) 5 % Place 1 patch onto the skin daily. Remove & Discard patch within 12 hours or as directed by MD 01/12/23   Storm Frisk, MD  magnesium oxide (MAG-OX) 400 MG tablet TAKE 1 CAPSULE (400 MG TOTAL) BY MOUTH 2 (TWO) TIMES DAILY. 01/12/23   Storm Frisk, MD  metoprolol succinate (TOPROL-XL) 50 MG 24 hr tablet Take 1 tablet (50 mg total) by mouth daily. Please keep upcoming appointment in April 2024 for future refills. Thank you 01/12/23   Storm Frisk, MD  Misc. Devices MISC Please provide patient with insurance approved straight cane 09/29/18   Claiborne Rigg, NP  Misc. Devices MISC Single-point cane with adjustable height  x1, shower chair x1, transfer bench x1.  Diagnosis congestive heart failure, degenerative disc disease of the lumbar spine. 11/23/18   [name], MD  mupirocin ointment (BACTROBAN) 2 % Apply 1 Application topically 3 (three) times daily. To area on vagina 11/22/22   Crain, Whitney L, PA  potassium chloride SA (KLOR-CON M20) 20 MEQ tablet TAKE 2 TABLETS IN THE MORNING AND 1 TABLET IN THE EVENING AS DIRECTED 01/12/23   Storm Frisk, MD  PROGESTERONE PO Take by mouth.    [provider]  sacubitril-valsartan (ENTRESTO) 24-26 MG Take 1 tablet by mouth 2 (  two) times daily. 01/12/23   Storm Frisk, MD  spironolactone (ALDACTONE) 25 MG tablet Take 0.5 tablets (12.5 mg total) by mouth daily. 01/12/23   Storm Frisk, MD  tiZANidine (ZANAFLEX) 4 MG tablet Take 1 tablet (4 mg total) by mouth every 8 (eight) hours as needed for muscle spasms. DO NOT DRINK ALCOHOL OR DRIVE WHILE TAKING THIS MEDICATION 01/12/23   Storm Frisk, MD  torsemide (DEMADEX) 20 MG tablet Take 1 tablet (20 mg total) by mouth daily. 01/12/23   Storm Frisk, MD  DULoxetine (CYMBALTA) 30 MG capsule Take 1 capsule (30 mg total) by mouth daily. 12/07/18 12/10/19  [name], MD  famotidine (PEPCID) 20 MG tablet Take 1 tablet (20 mg total) 2 (two) times daily by mouth. 04/10/17 11/03/19  Elisha Ponder, PA-C    Family History Family History  Problem Relation Age of Onset   Cancer Maternal Grandmother        uterine   Hypertension Sister    Healthy Mother    Other Neg Hx    Heart disease Neg Hx     Social History Social History   Tobacco Use   Smoking status: Former    Current packs/day: 0.00    Average packs/day: 0.1 packs/day for 30.0 years (3.0 ttl pk-yrs)    Types: Cigarettes    Start date: 06/02/1985    Quit date: 06/03/2015    Years since quitting: 7.6   Smokeless tobacco: Never   Tobacco comments:    Pt. stated she stopped smoking a year ago. 09/29/2018  Vaping Use   Vaping status:  Never Used  Substance Use Topics   Alcohol use: No    Alcohol/week: 0.0 standard drinks of alcohol   Drug use: No     Allergies   Ace inhibitors   Review of Systems Review of Systems Per HPI  Physical Exam Triage Vital Signs ED Triage Vitals  Encounter Vitals Group     BP 02/01/23 0901 (!) 167/99     Systolic BP Percentile --      Diastolic BP Percentile --      Pulse Rate 02/01/23 0901 (!) 101     Resp 02/01/23 0901 18     Temp 02/01/23 0901 98.4 F (36.9 C)     Temp Source 02/01/23 0901 Oral     SpO2 02/01/23 0901 96 %     Weight --      Height --      Head Circumference --      Peak Flow --      Pain Score 02/01/23 0902 9     Pain Loc --      Pain Education --      Exclude from Growth Chart --    Orthostatic VS for the past 24 hrs:  BP- Lying Pulse- Lying BP- Sitting Pulse- Sitting BP- Standing at 0 minutes Pulse- Standing at 0 minutes  02/01/23 0937 (!) 165/119 91 (!) 166/107 89 (!) 164/109 52    Updated Vital Signs BP (!) 167/99 (BP Location: Left Arm)   Pulse (!) 101   Temp 98.4 F (36.9 C) (Oral)   Resp 18   SpO2 96%   Visual Acuity Right Eye Distance:   Left Eye Distance:   Bilateral Distance:    Right Eye Near:   Left Eye Near:    Bilateral Near:     Physical Exam Vitals and nursing note reviewed.  Constitutional:      General: She is  not in acute distress.    Appearance: Normal appearance. She is not toxic-appearing.  HENT:     Head: Normocephalic and atraumatic.     Mouth/Throat:     Mouth: Mucous membranes are moist.     Pharynx: Oropharynx is clear.  Eyes:     General: No scleral icterus.    Extraocular Movements: Extraocular movements intact.  Cardiovascular:     Rate and Rhythm: Regular rhythm. Tachycardia present.  Pulmonary:     Effort: Pulmonary effort is normal. No respiratory distress.     Breath sounds: Normal breath sounds. No wheezing, rhonchi or rales.  Abdominal:     General: Abdomen is flat. Bowel sounds are  normal. There is no distension.     Palpations: Abdomen is soft.     Tenderness: There is generalized abdominal tenderness. There is no right CVA tenderness, left CVA tenderness, guarding or rebound. Negative signs include Murphy's sign, Rovsing's sign and McBurney's sign.  Musculoskeletal:     Cervical back: Normal range of motion.  Lymphadenopathy:     Cervical: No cervical adenopathy.  Skin:    General: Skin is warm and dry.     Capillary Refill: Capillary refill takes less than 2 seconds.     Coloration: Skin is not jaundiced or pale.     Findings: No erythema.  Neurological:     Mental Status: She is alert and oriented to person, place, and time.  Psychiatric:        Behavior: Behavior is cooperative.      UC Treatments / Results  Labs (all labs ordered are listed, but only abnormal results are displayed) Labs Reviewed  POCT FASTING CBG [facility] MANUAL ENTRY - Abnormal; Notable for the following components:      Result Value   POCT Glucose ([facility]) 123 (*)    All other components within normal limits  CBC  COMPREHENSIVE METABOLIC PANEL  LIPASE, BLOOD    EKG   Radiology No results found.  Procedures Procedures (including critical care time)  Medications Ordered in UC Medications  ondansetron (ZOFRAN-ODT) disintegrating tablet 4 mg (4 mg Oral Given 02/01/23 0936)  acetaminophen (TYLENOL) tablet 975 mg (975 mg Oral Given 02/01/23 1015)    Initial Impression / Assessment and Plan / UC Course  I have reviewed the triage vital signs and the nursing notes.  Pertinent labs & imaging results that were available during my care of the patient were reviewed by me and considered in my medical decision making (see chart for details).   Patient is well-appearing, normotensive, afebrile, not tachycardic, not tachypneic, oxygenating well on room air.    1. Generalized abdominal pain 2. Nausea and vomiting, unspecified vomiting type Unclear etiology, however no red flags in history  or on exam Vitals and exam are reassuring Orthostatic vital signs are negative for orthostasis; nonfasting blood sugar is not low Will obtain blood work to evaluate for infectious or metabolic abnormality, otherwise supportive care discussed with patient Pain and nausea were treated with Zofran and Tylenol in urgent care today with improvement in symptoms; patient able to tolerate ginger ale after Zofran Strict ER precautions discussed with patient if symptoms worsen or she develops blood in the vomit or stool Work excuse also given today  The patient was given the opportunity to ask questions.  All questions answered to their satisfaction.  The patient is in agreement to this plan.    Final Clinical Impressions(s) / UC Diagnoses   Final diagnoses:  Generalized abdominal pain  Nausea and vomiting, unspecified vomiting type     Discharge Instructions      We gave you Zofran today to help with the nausea.  Vital signs and blood sugar looked good today.  Please continue this at home every 8 hours as needed for nausea/vomiting.  You can also continue Tylenol 500 mg every 8 hours for abdominal pain.  Recommend pushing hydration with plenty of fluids.  If you develop severe abdominal pain not improved with Tylenol or Zofran, blood in your vomit or stool, recommend emergent evaluation.     ED Prescriptions     Medication Sig Dispense Auth. Provider   ondansetron (ZOFRAN-ODT) 4 MG disintegrating tablet Take 1 tablet (4 mg total) by mouth every 8 (eight) hours as needed for nausea or vomiting. 20 tablet Valentino Nose, NP      PDMP not reviewed this encounter.   Valentino Nose, NP 02/01/23 [phone]

## 2023-02-01 NOTE — Discharge Instructions (Signed)
We gave you Zofran today to help with the nausea.  Vital signs and blood sugar looked good today.  Please continue this at home every 8 hours as needed for nausea/vomiting.  You can also continue Tylenol 500 mg every 8 hours for abdominal pain.  Recommend pushing hydration with plenty of fluids.  If you develop severe abdominal pain not improved with Tylenol or Zofran, blood in your vomit or stool, recommend emergent evaluation.

## 2023-02-13 ENCOUNTER — Ambulatory Visit (HOSPITAL_COMMUNITY)
Admission: EM | Admit: 2023-02-13 | Discharge: 2023-02-13 | Disposition: A | Discharge status: Home / Self Care | Payer: Medicaid Other | Attending: Physician Assistant | Admitting: Physician Assistant

## 2023-02-13 ENCOUNTER — Encounter (HOSPITAL_COMMUNITY): Payer: Self-pay

## 2023-02-13 ENCOUNTER — Ambulatory Visit (INDEPENDENT_AMBULATORY_CARE_PROVIDER_SITE_OTHER): Payer: Medicaid Other

## 2023-02-13 DIAGNOSIS — R0602 Shortness of breath: Secondary | ICD-10-CM | POA: Diagnosis not present

## 2023-02-13 DIAGNOSIS — Z1152 Encounter for screening for COVID-19: Secondary | ICD-10-CM | POA: Insufficient documentation

## 2023-02-13 DIAGNOSIS — J441 Chronic obstructive pulmonary disease with (acute) exacerbation: Secondary | ICD-10-CM

## 2023-02-13 DIAGNOSIS — J069 Acute upper respiratory infection, unspecified: Secondary | ICD-10-CM

## 2023-02-13 DIAGNOSIS — R059 Cough, unspecified: Secondary | ICD-10-CM | POA: Diagnosis not present

## 2023-02-13 LAB — CBC WITH DIFFERENTIAL/PLATELET
Abs Immature Granulocytes: 0.01 10*3/uL (ref 0.00–0.07)
Basophils Absolute: 0 10*3/uL (ref 0.0–0.1)
Basophils Relative: 0 %
Eosinophils Absolute: 0.1 10*3/uL (ref 0.0–0.5)
Eosinophils Relative: 2 %
HCT: 43.2 % (ref 36.0–46.0)
Hemoglobin: 14.2 g/dL (ref 12.0–15.0)
Immature Granulocytes: 0 %
Lymphocytes Relative: 26 %
Lymphs Abs: 1.8 10*3/uL (ref 0.7–4.0)
MCH: 28.7 pg (ref 26.0–34.0)
MCHC: 32.9 g/dL (ref 30.0–36.0)
MCV: 87.3 fL (ref 80.0–100.0)
Monocytes Absolute: 0.5 10*3/uL (ref 0.1–1.0)
Monocytes Relative: 7 %
Neutro Abs: 4.4 10*3/uL (ref 1.7–7.7)
Neutrophils Relative %: 65 %
Platelets: 227 10*3/uL (ref 150–400)
RBC: 4.95 MIL/uL (ref 3.87–5.11)
RDW: 12.9 % (ref 11.5–15.5)
WBC: 6.7 10*3/uL (ref 4.0–10.5)
nRBC: 0 % (ref 0.0–0.2)

## 2023-02-13 LAB — COMPREHENSIVE METABOLIC PANEL
ALT: 17 U/L (ref 0–44)
AST: 16 U/L (ref 15–41)
Albumin: 3.2 g/dL — ABNORMAL LOW (ref 3.5–5.0)
Alkaline Phosphatase: 68 U/L (ref 38–126)
Anion gap: 8 (ref 5–15)
BUN: 8 mg/dL (ref 6–20)
CO2: 24 mmol/L (ref 22–32)
Calcium: 8.5 mg/dL — ABNORMAL LOW (ref 8.9–10.3)
Chloride: 107 mmol/L (ref 98–111)
Creatinine, Ser: 0.72 mg/dL (ref 0.44–1.00)
GFR, Estimated: >60 mL/min (ref >60)
Glucose, Bld: 94 mg/dL (ref 70–99)
Potassium: 3.5 mmol/L (ref 3.5–5.1)
Sodium: 139 mmol/L (ref 135–145)
Total Bilirubin: 0.8 mg/dL (ref 0.3–1.2)
Total Protein: 6.9 g/dL (ref 6.5–8.1)

## 2023-02-13 MED ORDER — ACETAMINOPHEN 325 MG PO TABS
650.0000 mg | ORAL_TABLET | Freq: Once | ORAL | Status: AC
  Administered 2023-02-13: 650 mg via ORAL

## 2023-02-13 MED ORDER — BENZONATATE 100 MG PO CAPS: 100.0000 mg | ORAL_CAPSULE | Freq: Three times a day (TID) | ORAL | 0 refills | Status: DC

## 2023-02-13 MED ORDER — ACETAMINOPHEN 325 MG PO TABS
ORAL_TABLET | ORAL | Status: AC
  Filled 2023-02-13: qty 2

## 2023-02-13 MED ORDER — ALBUTEROL SULFATE HFA 108 (90 BASE) MCG/ACT IN AERS
INHALATION_SPRAY | RESPIRATORY_TRACT | Status: AC
  Filled 2023-02-13: qty 6.7

## 2023-02-13 MED ORDER — ALBUTEROL SULFATE HFA 108 (90 BASE) MCG/ACT IN AERS
2.0000 | INHALATION_SPRAY | Freq: Once | RESPIRATORY_TRACT | Status: AC
  Administered 2023-02-13: 2 via RESPIRATORY_TRACT

## 2023-02-13 MED ORDER — SYMBICORT 80-4.5 MCG/ACT IN AERO: 2.0000 | INHALATION_SPRAY | Freq: Two times a day (BID) | RESPIRATORY_TRACT | 0 refills | Status: DC

## 2023-02-13 NOTE — Discharge Instructions (Signed)
Your x-ray was normal.  I will contact you if any of your blood work is abnormal.  Use the albuterol inhaler every 4-6 hours as needed.  Start Symbicort twice daily.  Make sure you rinse your mouth following use of this medication to prevent thrush/yeast in your mouth.  Use Tessalon for cough.  I also recommend over-the-counter medications including Mucinex, Flonase, Tylenol.  Make sure that you are resting and drinking plenty of fluid.  If your symptoms are not improving within a few days or if anything worsens you have increasing shortness of breath, chest pain, fever, nausea/vomiting you need to be seen immediately.

## 2023-02-13 NOTE — ED Triage Notes (Addendum)
Patient reports that she has had SOB x 2 days. Patient states SOB is worse at night.  Patient also c/o generalized body aches x 2-3 weeks.  Patient is requesting a refill on Albuterol inhaler.

## 2023-02-13 NOTE — ED Provider Notes (Signed)
[facility]    CSN: [id number] Arrival date & time: 02/13/23  1204      History   Chief Complaint Chief Complaint  Patient presents with   Shortness of Breath   Medication Refill    HPI Ilyssa Moleski is a 57 y.o. female.   Patient presents today with a 2-day history of shortness of breath.  She reports some associated cough but denies any fever, congestion, sore throat.  Denies any known sick contacts.  She has not been trying any over-the-counter medication for symptom management.  She does have a history of heart failure and has not been weighing herself daily.  She reports compliance with her medication regimen and has not increased her sodium consumption.  She denies any associated chest pain but has had some discomfort in her left rib cage when she is at work.  She denies any associated chest pain, pleuritic chest pain, lightheadedness, weakness.  She has not had COVID in the past 90 days and reports she is up-to-date on her COVID-19 vaccinations.  Denies any recent antibiotics or steroids.  She has COPD listed on her problem list but reports that she does not have any chronic lung condition.  She is a former smoker but quit several years ago.  She does use albuterol intermittently but does not have this available and is requesting a refill.  She does not take any maintenance medication.  She denies any known blood loss including heavy menstrual bleeding, melena, hematochezia.    Past Medical History:  Diagnosis Date   Abnormal thyroid blood test 03/15/2017   Abnormal uterine bleeding    Alopecia    Anemia    Angina pectoris with normal coronary arteriogram (HCC) 2017   Had + Troponin c/w ? 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 iniatially ?2/2 peripartum in 1994 - improved by 2008 then worsening EF in 2011 back down to EF 25-30%. b. echo 01/21/14 showed mod LVH, [name]; 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   Peripartum cardiomyopathy 1994   Sleep apnea 2015   CPAP 12/2013   Stroke (HCC) 2011   Systolic and diastolic CHF, acute on chronic (HCC) 06/14/2015   Tobacco abuse     Patient Active Problem List   Diagnosis Date Noted   Bacterial vaginitis 01/13/2023   External otitis of both ears due to fungus 10/07/2022   Chronic pain syndrome 12/28/2021   Primary osteoarthritis of both knees 12/28/2021   Spinal stenosis of lumbar region 07/21/2021   Lumbar radiculitis 07/21/2021   COPD (chronic obstructive pulmonary disease) (HCC) 07/21/2021   Mechanical complication due to intrauterine contraceptive device 07/21/2021   Arthropathy of lumbar facet joint 03/14/2019   Degenerative disc disease at L5-S1 level 10/10/2018   Low TSH level 04/26/2016   Abnormal uterine bleeding 04/06/2016   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   History of non-ST elevation myocardial infarction (NSTEMI) 06/14/2015   Homelessness 09/16/2014   Vitamin D deficiency 01/29/2014  OSA (obstructive sleep apnea) 01/08/2014   Chronic combined systolic and diastolic CHF (congestive heart failure) (HCC) 08/14/2013   Essential hypertension    Fibroids 08/09/2012   Menorrhagia 05/08/2012   Female pattern hair loss    HLD (hyperlipidemia) 06/08/2010   CEREBRAL ANEURYSM 06/08/2010   CEREBROVASCULAR ACCIDENT, HX OF 06/08/2010    Past Surgical History:  Procedure Laterality Date   CARDIAC CATHETERIZATION N/A 06/16/2015   Procedure: Left Heart Cath and Coronary Angiography;   Surgeon: Corky Crafts, MD;  Location: [facility] INVASIVE CV LAB;  Service: Cardiovascular;  Laterality: N/A;   CESAREAN SECTION  1992  1994   LOOP RECORDER IMPLANT  ~ 2000   TIBIAL TUBERCLERPLASTY     TUBAL LIGATION  1994    OB History     Gravida  2   Para  2   Term  2   Preterm      AB      Living  3      SAB      IAB      Ectopic      Multiple  1   Live Births  3            Home Medications    Prior to Admission medications   Medication Sig Start Date End Date Taking? Authorizing Provider  benzonatate (TESSALON) 100 MG capsule Take 1 capsule (100 mg total) by mouth every 8 (eight) hours. 02/13/23  Yes Tenna Lacko, Denny Peon K, PA-C  budesonide-formoterol (SYMBICORT) 80-4.5 MCG/ACT inhaler Inhale 2 puffs into the lungs in the morning and at bedtime. 02/13/23  Yes Pearline Yerby K, PA-C  albuterol (VENTOLIN HFA) 108 (90 Base) MCG/ACT inhaler Inhale 2 puffs into the lungs every 4 (four) hours as needed for wheezing or shortness of breath. 01/12/23   Storm Frisk, MD  atorvastatin (LIPITOR) 10 MG tablet Take 1 tablet (10 mg total) by mouth daily. 01/12/23   Storm Frisk, MD  Blood Pressure Monitoring (BLOOD PRESSURE KIT) DEVI Use to measure blood pressure twice daily 07/22/21   Storm Frisk, MD  diclofenac Sodium (VOLTAREN) 1 % GEL Apply 4 g topically 4 (four) times daily. 01/12/23   Storm Frisk, MD  Fluocinolone Acetonide (DERMOTIC) 0.01 % OIL Place 5 drops in ear(s) every 12 (twelve) hours. 10/06/22   Storm Frisk, MD  ibuprofen (ADVIL) 800 MG tablet TAKE 1 TABLET EVERY 8 HRS AS NEEDED FOR UPTO 30 DOSES FOR FEVER,HEADACHE,MILD PAIN OR MODERATE PAIN 01/31/23   Storm Frisk, MD  levocetirizine (XYZAL) 5 MG tablet Take 1 tablet (5 mg total) by mouth every evening. 01/12/23 07/11/23  Storm Frisk, MD  lidocaine (LIDODERM) 5 % Place 1 patch onto the skin daily. Remove & Discard patch within 12 hours or as directed by MD 01/12/23   Storm Frisk, MD   magnesium oxide (MAG-OX) 400 MG tablet TAKE 1 CAPSULE (400 MG TOTAL) BY MOUTH 2 (TWO) TIMES DAILY. 01/12/23   Storm Frisk, MD  metoprolol succinate (TOPROL-XL) 50 MG 24 hr tablet Take 1 tablet (50 mg total) by mouth daily. Please keep upcoming appointment in April 2024 for future refills. Thank you 01/12/23   Storm Frisk, MD  Misc. Devices MISC Please provide patient with insurance approved straight cane 09/29/18   Claiborne Rigg, NP  Misc. Devices MISC Single-point cane with adjustable height x1, shower chair x1, transfer bench x1.  Diagnosis congestive heart failure, degenerative disc disease of the lumbar spine. 11/23/18  [name], MD  mupirocin ointment (BACTROBAN) 2 % Apply 1 Application topically 3 (three) times daily. To area on vagina 11/22/22   Crain, Whitney L, PA  ondansetron (ZOFRAN-ODT) 4 MG disintegrating tablet Take 1 tablet (4 mg total) by mouth every 8 (eight) hours as needed for nausea or vomiting. 02/01/23   Valentino Nose, NP  potassium chloride SA (KLOR-CON M20) 20 MEQ tablet TAKE 2 TABLETS IN THE MORNING AND 1 TABLET IN THE EVENING AS DIRECTED 01/12/23   Storm Frisk, MD  PROGESTERONE PO Take by mouth.    [provider]  sacubitril-valsartan (ENTRESTO) 24-26 MG Take 1 tablet by mouth 2 (two) times daily. 01/12/23   Storm Frisk, MD  spironolactone (ALDACTONE) 25 MG tablet Take 0.5 tablets (12.5 mg total) by mouth daily. 01/12/23   Storm Frisk, MD  tiZANidine (ZANAFLEX) 4 MG tablet Take 1 tablet (4 mg total) by mouth every 8 (eight) hours as needed for muscle spasms. DO NOT DRINK ALCOHOL OR DRIVE WHILE TAKING THIS MEDICATION 01/12/23   Storm Frisk, MD  torsemide (DEMADEX) 20 MG tablet Take 1 tablet (20 mg total) by mouth daily. 01/12/23   Storm Frisk, MD  DULoxetine (CYMBALTA) 30 MG capsule Take 1 capsule (30 mg total) by mouth daily. 12/07/18 12/10/19  [name], MD  famotidine (PEPCID) 20 MG tablet Take 1 tablet (20 mg  total) 2 (two) times daily by mouth. 04/10/17 11/03/19  Elisha Ponder, PA-C    Family History Family History  Problem Relation Age of Onset   Cancer Maternal Grandmother        uterine   Hypertension Sister    Healthy Mother    Other Neg Hx    Heart disease Neg Hx     Social History Social History   Tobacco Use   Smoking status: Former    Current packs/day: 0.00    Average packs/day: 0.1 packs/day for 30.0 years (3.0 ttl pk-yrs)    Types: Cigarettes    Start date: 06/02/1985    Quit date: 06/03/2015    Years since quitting: 7.7   Smokeless tobacco: Never   Tobacco comments:    Pt. stated she stopped smoking a year ago. 09/29/2018  Vaping Use   Vaping status: Never Used  Substance Use Topics   Alcohol use: No    Alcohol/week: 0.0 standard drinks of alcohol   Drug use: No     Allergies   Ace inhibitors   Review of Systems Review of Systems  Constitutional:  Positive for activity change. Negative for appetite change, fatigue and fever.  HENT:  Negative for congestion, sinus pressure, sneezing and sore throat.   Respiratory:  Positive for cough and shortness of breath.   Cardiovascular:  Negative for chest pain.  Gastrointestinal:  Negative for abdominal pain, diarrhea, nausea and vomiting.     Physical Exam Triage Vital Signs ED Triage Vitals  Encounter Vitals Group     BP 02/13/23 1327 (!) 147/83     Systolic BP Percentile --      Diastolic BP Percentile --      Pulse Rate 02/13/23 1327 79     Resp 02/13/23 1327 16     Temp 02/13/23 1327 99.2 F (37.3 C)     Temp Source 02/13/23 1327 Oral     SpO2 02/13/23 1327 96 %     Weight --      Height --      Head Circumference --  Peak Flow --      Pain Score 02/13/23 1330 9     Pain Loc --      Pain Education --      Exclude from Growth Chart --    No data found.  Updated Vital Signs BP (!) 147/83 (BP Location: Right Arm)   Pulse 79   Temp 99.2 F (37.3 C) (Oral)   Resp 16   SpO2 96%    Visual Acuity Right Eye Distance:   Left Eye Distance:   Bilateral Distance:    Right Eye Near:   Left Eye Near:    Bilateral Near:     Physical Exam Vitals reviewed.  Constitutional:      General: She is awake. She is not in acute distress.    Appearance: Normal appearance. She is well-developed. She is not ill-appearing.     Comments: Very pleasant female appears stated age in no acute distress sitting in exam room talking on cell phone  HENT:     Head: Normocephalic and atraumatic.     Right Ear: Tympanic membrane, ear canal and external ear normal. Tympanic membrane is not erythematous or bulging.     Left Ear: Tympanic membrane, ear canal and external ear normal. Tympanic membrane is not erythematous or bulging.     Nose:     Right Sinus: No maxillary sinus tenderness or frontal sinus tenderness.     Left Sinus: No maxillary sinus tenderness or frontal sinus tenderness.     Mouth/Throat:     Pharynx: Uvula midline. No oropharyngeal exudate or posterior oropharyngeal erythema.  Cardiovascular:     Rate and Rhythm: Normal rate and regular rhythm.     Heart sounds: Normal heart sounds, S1 normal and S2 normal. No murmur heard.    Comments: No significant lower extremity edema. Pulmonary:     Effort: Pulmonary effort is normal.     Breath sounds: Examination of the left-lower field reveals rhonchi. Rhonchi present. No wheezing or rales.  Musculoskeletal:     Right lower leg: No edema.     Left lower leg: No edema.  Psychiatric:        Behavior: Behavior is cooperative.      UC Treatments / Results  Labs (all labs ordered are listed, but only abnormal results are displayed) Labs Reviewed  SARS CORONAVIRUS 2 (TAT 6-24 HRS)  CBC WITH DIFFERENTIAL/PLATELET  COMPREHENSIVE METABOLIC PANEL    EKG   Radiology DG Chest 2 View  Result Date: 02/13/2023 CLINICAL DATA:  Cough and shortness of breath for 2 days. EXAM: CHEST - 2 VIEW COMPARISON:  09/19/2022 FINDINGS: The  heart size and mediastinal contours are within normal limits. Both lungs are clear. The visualized skeletal structures are unremarkable. IMPRESSION: No active cardiopulmonary disease. Electronically Signed   By: Danae Orleans M.D.   On: 02/13/2023 14:26    Procedures Procedures (including critical care time)  Medications Ordered in UC Medications  acetaminophen (TYLENOL) tablet 650 mg (650 mg Oral Given 02/13/23 1402)  albuterol (VENTOLIN HFA) 108 (90 Base) MCG/ACT inhaler 2 puff (2 puffs Inhalation Given 02/13/23 1403)    Initial Impression / Assessment and Plan / UC Course  I have reviewed the triage vital signs and the nursing notes.  Pertinent labs & imaging results that were available during my care of the patient were reviewed by me and considered in my medical decision making (see chart for details).     Patient is well-appearing, afebrile, nontoxic, nontachycardic.  Chest x-ray  was obtained that showed no acute cardiopulmonary disease.  Low suspicion for heart failure exacerbation given her clinical presentation including lack of swelling on physical exam.  Given her short duration of symptoms COVID testing was obtained and is pending.  She is a candidate for antiviral therapy if positive, however, given potential medication interaction including with her atorvastatin and Entresto I would recommend molnupiravir over Paxlovid.  She was given albuterol inhaler with improvement of symptoms.  Patient reports that she does not have COPD but I am concerned for COPD exacerbation given her clinical presentation and history.  She denies any increased feeding production or change in viscosity of sputum so do not believe that she warrants antibiotics at this time but will consider this in the future.  We did discuss potential utility of prednisone to help with her symptoms, however, given her history of heart failure and minimal wheezing with oxygen saturation of 96% we will instead start inhaled  corticosteroids and LABA (Symbicort) in the hopes that this will improve her breathing without as many side effects or concern for disease exacerbation.  She was instructed to rinse her mouth following use of this medication to prevent thrush.  Can use over-the-counter medications including Mucinex and was sent in Tessalon for cough.  Recommend that she use Tylenol and Flonase for additional symptom relief.  She is to rest and drink plenty of fluid.  Basic labs were obtained today including CBC and CMP.  We will contact her if these are abnormal.  Discussed that if her symptoms are not improving within a few days she should return for reevaluation.  If she has any worsening symptoms including high fever, worsening cough, increased pedal production, shortness of breath, chest pain, nausea, vomiting she needs to be seen immediately.  Strict return precautions given.  Work excuse note provided.  Final Clinical Impressions(s) / UC Diagnoses   Final diagnoses:  Upper respiratory tract infection, unspecified type  Shortness of breath  COPD exacerbation (HCC)     Discharge Instructions      Your x-ray was normal.  I will contact you if any of your blood work is abnormal.  Use the albuterol inhaler every 4-6 hours as needed.  Start Symbicort twice daily.  Make sure you rinse your mouth following use of this medication to prevent thrush/yeast in your mouth.  Use Tessalon for cough.  I also recommend over-the-counter medications including Mucinex, Flonase, Tylenol.  Make sure that you are resting and drinking plenty of fluid.  If your symptoms are not improving within a few days or if anything worsens you have increasing shortness of breath, chest pain, fever, nausea/vomiting you need to be seen immediately.     ED Prescriptions     Medication Sig Dispense Auth. Provider   benzonatate (TESSALON) 100 MG capsule Take 1 capsule (100 mg total) by mouth every 8 (eight) hours. 21 capsule Taylee Gunnells K, PA-C    budesonide-formoterol (SYMBICORT) 80-4.5 MCG/ACT inhaler Inhale 2 puffs into the lungs in the morning and at bedtime. 10.2 g Emberlin Verner K, PA-C      PDMP not reviewed this encounter.   Jeani Hawking, PA-C 02/13/23 1500

## 2023-02-14 LAB — SARS CORONAVIRUS 2 (TAT 6-24 HRS): SARS Coronavirus 2: NEGATIVE

## 2023-03-03 DIAGNOSIS — M5416 Radiculopathy, lumbar region: Secondary | ICD-10-CM | POA: Diagnosis not present

## 2023-03-04 ENCOUNTER — Ambulatory Visit: Payer: Self-pay

## 2023-03-04 NOTE — Telephone Encounter (Signed)
Noted. Patient to [facility]

## 2023-03-04 NOTE — Telephone Encounter (Signed)
  Chief Complaint: Pain left side, side of body from below ribcage to waist Symptoms: Sharp pain - comes and goes Frequency: 1 month Pertinent Negatives: Patient denies radiating Disposition: [] ED /[x] Urgent Care (no appt availability in office) / [] Appointment(In office/virtual)/ []  [facility]/ [] Home Care/ [] Refused Recommended Disposition /[] [facility]/ []  Follow-up with PCP Additional Notes: Pt states that she has had this pain for over 1 month. Pain seems to be increasing. PT thinks it may be her kidney. No other urinary s/s. A Bm does seem to ease the pain a bit. No appts in office, advised UC.  Pt would also like to meet with a Child psychotherapist for help with housing. Please advise.    Reason for Disposition  [1] MILD-MODERATE pain AND [2] constant AND [3] present > 2 hours  Answer Assessment - Initial Assessment Questions 1. LOCATION: "Where does it hurt?"      Left side 2. RADIATION: "Does the pain shoot anywhere else?" (e.g., chest, back)     no 3. ONSET: "When did the pain begin?" (e.g., minutes, hours or days ago)      1 month 4. SUDDEN: "Gradual or sudden onset?"     Gradual 5. PATTERN "Does the pain come and go, or is it constant?"    - If it comes and goes: "How long does it last?" "Do you have pain now?"     (Note: Comes and goes means the pain is intermittent. It goes away completely between bouts.)    - If constant: "Is it getting better, staying the same, or getting worse?"      (Note: Constant means the pain never goes away completely; most serious pain is constant and gets worse.)      Comes and goes 6. SEVERITY: "How bad is the pain?"  (e.g., Scale 1-10; mild, moderate, or severe)    - MILD (1-3): Doesn't interfere with normal activities, abdomen soft and not tender to touch.     - MODERATE (4-7): Interferes with normal activities or awakens from sleep, abdomen tender to touch.     - SEVERE (8-10): Excruciating pain, doubled over, unable to  do any normal activities.       8/10 7. RECURRENT SYMPTOM: "Have you ever had this type of stomach pain before?" If Yes, ask: "When was the last time?" and "What happened that time?"      yes 8. CAUSE: "What do you think is causing the stomach pain?"     kidney 9. RELIEVING/AGGRAVATING FACTORS: "What makes it better or worse?" (e.g., antacids, bending or twisting motion, bowel movement)     BM makes it better 10. OTHER SYMPTOMS: "Do you have any other symptoms?" (e.g., back pain, diarrhea, fever, urination pain, vomiting)       no  Protocols used: Abdominal Pain - Female-A-AH

## 2023-03-07 ENCOUNTER — Telehealth: Payer: Self-pay

## 2023-03-07 NOTE — Telephone Encounter (Signed)
Call placed to patient in response to her question about housing.   The patient  reported that she continues to experience the left side pain below her ribcage that she spoke to the triage nurse about on 03/04/2023.  She said she has been taking tylenol and the pain comes and goes. She said she has not been to Urgent Care but plans to go this week. I told her that it is very important to see a provider as she may need lab work, testing, medications for further evaluation. She said she understood and will go this week.  Regarding housing, she is currently couch surfing and trying to find affordable housing. She currently works and her income is $2500/month and she would like the rent to be no more than $700/month    I spoke about [employer] and [employer] and she said she has their contact information and has spoken to [employer] and they did not have any information that would help her.  I also informed her that the [facility] has housing case managers.   She then asked if I could send her any information. I told her that I could print the listing of available rentals from [email or web address].   She was appreciative and requested that I mail the information to her mother's home.  The address was confirmed with the patient and the information requested was mailed.

## 2023-03-19 ENCOUNTER — Other Ambulatory Visit (HOSPITAL_COMMUNITY)
Admission: RE | Admit: 2023-03-19 | Discharge: 2023-03-19 | Disposition: A | Discharge status: Home / Self Care | Payer: Medicaid Other | Source: Ambulatory Visit | Attending: Primary Care | Admitting: Primary Care

## 2023-03-19 ENCOUNTER — Ambulatory Visit: Payer: Medicaid Other | Attending: Family | Admitting: Primary Care

## 2023-03-19 VITALS — BP 133/85 | HR 68 | Wt 200.4 lb

## 2023-03-19 DIAGNOSIS — N3001 Acute cystitis with hematuria: Secondary | ICD-10-CM

## 2023-03-19 DIAGNOSIS — Z23 Encounter for immunization: Secondary | ICD-10-CM

## 2023-03-19 DIAGNOSIS — T8369XA Infection and inflammatory reaction due to other prosthetic device, implant and graft in genital tract, initial encounter: Secondary | ICD-10-CM

## 2023-03-19 DIAGNOSIS — R109 Unspecified abdominal pain: Secondary | ICD-10-CM | POA: Diagnosis not present

## 2023-03-19 DIAGNOSIS — F17210 Nicotine dependence, cigarettes, uncomplicated: Secondary | ICD-10-CM

## 2023-03-19 DIAGNOSIS — M47816 Spondylosis without myelopathy or radiculopathy, lumbar region: Secondary | ICD-10-CM | POA: Diagnosis not present

## 2023-03-19 DIAGNOSIS — Z760 Encounter for issue of repeat prescription: Secondary | ICD-10-CM

## 2023-03-19 LAB — POCT URINALYSIS DIP (CLINITEK)
Bilirubin, UA: NEGATIVE
Glucose, UA: NEGATIVE mg/dL
Ketones, POC UA: NEGATIVE mg/dL
Nitrite, UA: NEGATIVE
POC PROTEIN,UA: NEGATIVE
Spec Grav, UA: 1.01 (ref 1.010–1.025)
Urobilinogen, UA: 0.2 U/dL
pH, UA: 6.5 (ref 5.0–8.0)

## 2023-03-19 MED ORDER — IBUPROFEN 800 MG PO TABS
800.0000 mg | ORAL_TABLET | Freq: Three times a day (TID) | ORAL | 0 refills | Status: DC | PRN
Start: 2023-03-19 — End: 2023-04-10

## 2023-03-19 MED ORDER — LIDOCAINE 5 % EX PTCH: 1.0000 | MEDICATED_PATCH | CUTANEOUS | 0 refills | Status: DC

## 2023-03-19 MED ORDER — DICLOFENAC SODIUM 1 % EX GEL
4.0000 g | Freq: Four times a day (QID) | CUTANEOUS | 0 refills | Status: DC
Start: 2023-03-19 — End: 2023-04-11

## 2023-03-19 MED ORDER — FLUCONAZOLE 150 MG PO TABS: 150.0000 mg | ORAL_TABLET | Freq: Once | ORAL | 0 refills | Status: AC

## 2023-03-19 MED ORDER — CIPROFLOXACIN HCL 500 MG PO TABS
500.0000 mg | ORAL_TABLET | Freq: Two times a day (BID) | ORAL | 0 refills | Status: AC
Start: 2023-03-19 — End: 2023-03-22

## 2023-03-19 NOTE — Progress Notes (Signed)
[facility]  Chelsea Branch, is a 57 y.o. female  WNU:[id number]  IHK:[id number]  DOB - [birth year]  Chief Complaint  Patient presents with   Flank Pain   Medication Refill       Subjective:   Chelsea Branch is a 57 y.o. female here today for an acute  visit.  She c/o sharpe left side pain. Also, concerned with uterine fibroids and removal of IUD.  Patient has No headache, No chest pain, No abdominal pain - No Nausea, No new weakness tingling or numbness, No Cough - shortness of breath  No problems updated.  Allergies  Allergen Reactions   Ace Inhibitors Other (See Comments)    REACTION: Cough    Past Medical History:  Diagnosis Date   Abnormal thyroid blood test 03/15/2017   Abnormal uterine bleeding    Alopecia    Anemia    Angina pectoris with normal coronary arteriogram (HCC) 2017   Had + Troponin c/w ? 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 iniatially ?2/2 peripartum in 1994 - improved by 2008 then worsening EF in 2011 back down to EF 25-30%. b. echo 01/21/14 showed mod LVH, [name]; 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   Peripartum cardiomyopathy 1994   Sleep apnea 2015   CPAP 12/2013   Stroke (HCC) 2011   Systolic and diastolic CHF, acute on chronic (HCC) 06/14/2015   Tobacco abuse     Current Outpatient Medications on File Prior to Visit  Medication Sig Dispense Refill   albuterol (VENTOLIN HFA) 108 (90 Base) MCG/ACT inhaler Inhale 2 puffs into the lungs  every 4 (four) hours as needed for wheezing or shortness of breath. 18 g 0   atorvastatin (LIPITOR) 10 MG tablet Take 1 tablet (10 mg total) by mouth daily. 90 tablet 3   benzonatate (TESSALON) 100 MG capsule Take 1 capsule (100 mg total) by mouth every 8 (eight) hours. 21 capsule 0   Blood Pressure Monitoring (BLOOD PRESSURE KIT) DEVI Use to measure blood pressure twice daily 1 each 0   budesonide-formoterol (SYMBICORT) 80-4.5 MCG/ACT inhaler Inhale 2 puffs into the lungs in the morning and at bedtime. 10.2 g 0   Fluocinolone Acetonide (DERMOTIC) 0.01 % OIL Place 5 drops in ear(s) every 12 (twelve) hours. 20 mL 0   levocetirizine (XYZAL) 5 MG tablet Take 1 tablet (5 mg total) by mouth every evening. 90 tablet 1   magnesium oxide (MAG-OX) 400 MG tablet TAKE 1 CAPSULE (400 MG TOTAL) BY MOUTH 2 (TWO) TIMES DAILY. 180 tablet 2   metoprolol succinate (TOPROL-XL) 50 MG 24 hr tablet Take 1 tablet (50 mg total) by mouth daily. Please keep upcoming appointment in April 2024 for future refills. Thank you 90 tablet 3   Misc. Devices MISC Please provide patient with insurance approved straight cane 1 each 0   Misc. Devices MISC Single-point cane with adjustable height x1, shower chair x1, transfer bench x1.  Diagnosis congestive heart failure, degenerative  disc disease of the lumbar spine. 1 each 0   mupirocin ointment (BACTROBAN) 2 % Apply 1 Application topically 3 (three) times daily. To area on vagina 22 g 0   ondansetron (ZOFRAN-ODT) 4 MG disintegrating tablet Take 1 tablet (4 mg total) by mouth every 8 (eight) hours as needed for nausea or vomiting. 20 tablet 0   potassium chloride SA (KLOR-CON M20) 20 MEQ tablet TAKE 2 TABLETS IN THE MORNING AND 1 TABLET IN THE EVENING AS DIRECTED 270 tablet 0   PROGESTERONE PO Take by mouth.     sacubitril-valsartan (ENTRESTO) 24-26 MG Take 1 tablet by mouth 2 (two) times daily. 60 tablet 4   spironolactone (ALDACTONE) 25 MG tablet Take 0.5 tablets (12.5 mg total) by  mouth daily. 60 tablet 2   torsemide (DEMADEX) 20 MG tablet Take 1 tablet (20 mg total) by mouth daily. 60 tablet 3   [DISCONTINUED] DULoxetine (CYMBALTA) 30 MG capsule Take 1 capsule (30 mg total) by mouth daily. 30 capsule 3   [DISCONTINUED] famotidine (PEPCID) 20 MG tablet Take 1 tablet (20 mg total) 2 (two) times daily by mouth. 6 tablet 0   No current facility-administered medications on file prior to visit.    Objective:   Vitals:   03/19/23 0959  BP: 133/85  Pulse: 68  SpO2: 98%  Weight: 200 lb 6.4 oz (90.9 kg)   Health Maintenance  Topic Date Due   COLON CANCER SCREENING ANNUAL FOBT  09/07/2010   MAMMOGRAM  Never done   Zoster Vaccines- Shingrix (1 of 2) Never done   Cervical Cancer Screening (HPV/Pap Cotest)  05/09/2015   COVID-19 Vaccine (1 - 2023-24 season) Never done   DTaP/Tdap/Td (2 - Td or Tdap) 09/11/2024   INFLUENZA VACCINE  Completed   Hepatitis C Screening  Completed   HIV Screening  Completed   HPV VACCINES  Aged Out   Colonoscopy  Discontinued    Comprehensive ROS Pertinent positive and negative noted in HPI   Exam General appearance : Awake, alert, not in any distress. Speech Clear. Not toxic looking HEENT: Atraumatic and Normocephalic, pupils equally reactive to light and accomodation Neck: Supple, no JVD. No cervical lymphadenopathy.  Chest: Good air entry bilaterally, no added sounds  CVS: S1 S2 regular, no murmurs.  Abdomen: Bowel sounds present, Non tender and not distended with no gaurding, rigidity or rebound. Extremities: B/L Lower Ext shows no edema, both legs are warm to touch Neurology: Awake alert, and oriented X 3, CN II-XII intact, Non focal Skin: No Rash  Data Review Lab Results  Component Value Date   HGBA1C 5.8 (H) 06/14/2015   HGBA1C (H) 05/18/2010    6.0 (NOTE)                                                                       According to the ADA Clinical Practice Recommendations for 2011, when HbA1c is used as a screening  test:   >=6.5%   Diagnostic of Diabetes Mellitus           (if abnormal result  is confirmed)  5.7-6.4%   Increased risk of developing Diabetes Mellitus  References:Diagnosis and Classification of Diabetes Mellitus,Diabetes Care,2011,34(Suppl 1):S62-S69 and Standards of Medical Care in  Diabetes - 2011,Diabetes Care,2011,34  (Suppl 1):S11-S61.   HGBA1C (H) 09/06/2009    6.0 (NOTE)                                                                       According to the ADA Clinical Practice Recommendations for 2011, when HbA1c is used as a screening test:   >=6.5%   Diagnostic of Diabetes Mellitus           (if abnormal result  is confirmed)  5.7-6.4%   Increased risk of developing Diabetes Mellitus  References:Diagnosis and Classification of Diabetes Mellitus,Diabetes Care,2011,34(Suppl 1):S62-S69 and Standards of Medical Care in         Diabetes - 2011,Diabetes Care,2011,34  (Suppl 1):S11-S61.    Assessment & Plan  Chelsea Branch was seen today for flank pain and medication refill.  Diagnoses and all orders for this visit:  Arthropathy of lumbar facet joint -     lidocaine (LIDODERM) 5 %; Place 1 patch onto the skin daily. Remove & Discard patch within 12 hours or as directed by MD -     diclofenac Sodium (VOLTAREN) 1 % GEL; Apply 4 g topically 4 (four) times daily.  Acute left flank pain -     Urinalysis  IUD infection, initial encounter (HCC) -     Cervicovaginal ancillary only  Medication refill -     ibuprofen (ADVIL) 800 MG tablet; Take 1 tablet (800 mg total) by mouth every 8 (eight) hours as needed. -     lidocaine (LIDODERM) 5 %; Place 1 patch onto the skin daily. Remove & Discard patch within 12 hours or as directed by MD -     diclofenac Sodium (VOLTAREN) 1 % GEL; Apply 4 g topically 4 (four) times daily.       Patient have been counseled extensively about nutrition and exercise. Other issues discussed during this visit include: low cholesterol diet, weight control and daily  exercise, foot care, annual eye examinations at Ophthalmology, importance of adherence with medications and regular follow-up. We also discussed long term complications of uncontrolled diabetes and hypertension.   No follow-ups on file.  The patient was given clear instructions to go to ER or return to medical center if symptoms don't improve, worsen or new problems develop. The patient verbalized understanding. The patient was told to call to get lab results if they haven't heard anything in the next week.   This note has been created with Education officer, environmental. Any transcriptional errors are unintentional.   Grayce Sessions, NP 04/03/2023, 9:32 PM

## 2023-03-22 ENCOUNTER — Other Ambulatory Visit (INDEPENDENT_AMBULATORY_CARE_PROVIDER_SITE_OTHER): Payer: Self-pay | Admitting: Primary Care

## 2023-03-22 DIAGNOSIS — B9689 Other specified bacterial agents as the cause of diseases classified elsewhere: Secondary | ICD-10-CM

## 2023-03-22 LAB — CERVICOVAGINAL ANCILLARY ONLY
Bacterial Vaginitis (gardnerella): POSITIVE — AB
Candida Glabrata: NEGATIVE
Candida Vaginitis: NEGATIVE
Chlamydia: NEGATIVE
Comment: NEGATIVE
Comment: NEGATIVE
Comment: NEGATIVE
Comment: NEGATIVE
Comment: NEGATIVE
Comment: NORMAL
Neisseria Gonorrhea: NEGATIVE
Trichomonas: NEGATIVE

## 2023-03-22 MED ORDER — METRONIDAZOLE 500 MG PO TABS: 500.0000 mg | ORAL_TABLET | Freq: Two times a day (BID) | ORAL | 0 refills | Status: DC

## 2023-04-06 ENCOUNTER — Other Ambulatory Visit (HOSPITAL_COMMUNITY)
Admission: RE | Admit: 2023-04-06 | Discharge: 2023-04-06 | Disposition: A | Discharge status: Home / Self Care | Payer: Medicaid Other | Source: Ambulatory Visit | Attending: Obstetrics & Gynecology | Admitting: Obstetrics & Gynecology

## 2023-04-06 ENCOUNTER — Encounter: Payer: Self-pay | Admitting: Obstetrics & Gynecology

## 2023-04-06 ENCOUNTER — Ambulatory Visit: Payer: Medicaid Other | Admitting: Obstetrics & Gynecology

## 2023-04-06 VITALS — BP 143/89 | HR 83 | Wt 196.0 lb

## 2023-04-06 DIAGNOSIS — Z1331 Encounter for screening for depression: Secondary | ICD-10-CM | POA: Diagnosis not present

## 2023-04-06 DIAGNOSIS — T8332XA Displacement of intrauterine contraceptive device, initial encounter: Secondary | ICD-10-CM | POA: Diagnosis not present

## 2023-04-06 DIAGNOSIS — Z1231 Encounter for screening mammogram for malignant neoplasm of breast: Secondary | ICD-10-CM

## 2023-04-06 DIAGNOSIS — Z124 Encounter for screening for malignant neoplasm of cervix: Secondary | ICD-10-CM

## 2023-04-06 DIAGNOSIS — Z975 Presence of (intrauterine) contraceptive device: Secondary | ICD-10-CM | POA: Insufficient documentation

## 2023-04-06 NOTE — Patient Instructions (Signed)
Breast center      [phone]

## 2023-04-06 NOTE — Progress Notes (Signed)
GYNECOLOGY OFFICE VISIT NOTE  History:   Chelsea Branch is a 57 y.o. [phone] here today for evaluation of her concern about her IUD shifting.  History of fibroids and AUB, had Lilleta placed 12/15/2015.   For the past four months, she has left sided back and flank pain, worsened with movement.  She thinks her IUD shifted and is causing this pain. She denies any abnormal vaginal discharge, bleeding, pelvic pain or other concerns.    Past Medical History:  Diagnosis Date   Abnormal thyroid blood test 03/15/2017   Abnormal uterine bleeding    Alopecia    Anemia    Angina pectoris with normal coronary arteriogram (HCC) 2017   Had + Troponin c/w ? 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 iniatially ?2/2 peripartum in 1994 - improved by 2008 then worsening EF in 2011 back down to EF 25-30%. b. echo 01/21/14 showed mod LVH, [name]; 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   Peripartum cardiomyopathy 1994   Sleep apnea 2015   CPAP 12/2013   Stroke (HCC) 2011   Systolic and diastolic CHF, acute on chronic (HCC) 06/14/2015   Tobacco abuse     Past Surgical History:  Procedure Laterality Date   CARDIAC CATHETERIZATION N/A 06/16/2015   Procedure: Left Heart Cath and Coronary Angiography;  Surgeon: Corky Crafts, MD;  Location: [facility] INVASIVE CV LAB;  Service: Cardiovascular;  Laterality: N/A;   CESAREAN SECTION  1992  1994   LOOP RECORDER IMPLANT  ~ 2000   TIBIAL TUBERCLERPLASTY     TUBAL LIGATION  1994     The following portions of the patient's history were reviewed and updated as appropriate: allergies, current medications, past family history, past medical history, past social history, past surgical history and problem list.   Health Maintenance:  Normal pap and negative HRHPV in 2013.  Unable to see any mammogram report.   Review of Systems:  Pertinent items noted in HPI and remainder of comprehensive ROS otherwise negative.  Physical Exam:  BP (!) 142/94   Pulse 84   Wt 196 lb (88.9 kg)   BMI 29.80 kg/m  CONSTITUTIONAL: Well-developed, well-nourished female in no acute distress.  SKIN: No rash noted. Not diaphoretic. No erythema. No pallor. MUSCULOSKELETAL: Normal range of motion. No edema noted. NEUROLOGIC: Alert and oriented to person, place, and time. Normal muscle tone coordination. No cranial nerve deficit noted. PSYCHIATRIC: Normal mood and affect. Normal behavior. Normal judgment and thought content. CARDIOVASCULAR: Normal heart rate noted RESPIRATORY: Effort and breath sounds normal, no problems with respiration noted ABDOMEN: No masses noted. No other overt distention noted.  BACK: No CVAT, but point tenderness around left flank and back area  PELVIC: Normal appearing external genitalia; normal urethral meatus; normal appearing vaginal mucosa and cervix.  No IUD strings seen or palpated during collection of Pap smear specimen.  No abnormal discharge noted.  No uterine or adnexal tenderness. Performed in the presence of a chaperone  Assessment and Plan:     1. Breast cancer screening by mammogram Mammogram scheduled - MM 3D SCREENING MAMMOGRAM BILATERAL BREAST; Future  2. Pap smear for cervical cancer screening - Cytology - PAP done, will follow up results and manage accordingly.  3. Intrauterine contraceptive device threads lost, initial encounter 4. Liletta IUD (intrauterine device) in place since 12/15/2015 IUD strings not seen, ultrasound ordered.  IUD due  for removal in 2025. - US PELVIC COMPLETE WITH TRANSVAGINAL; Future Patient reassured that her left flank and back pain was likely musculoskeletal, not related to her uterus or IUD given anatomic location.    Routine preventative health maintenance measures emphasized; also advised to follow up with PCP and other specialists for all other issues. Please refer to After Visit Summary for other counseling recommendations.   Return for any gynecologic concerns.    I spent 30 minutes dedicated to the care of this patient including pre-visit review of records, face to face time with the patient discussing her conditions and treatments and post visit orders.    Jaynie Collins, MD, FACOG Obstetrician & Gynecologist, [facility] for [employer], [facility]

## 2023-04-08 LAB — CYTOLOGY - PAP
Comment: NEGATIVE
Diagnosis: NEGATIVE
High risk HPV: NEGATIVE

## 2023-04-10 ENCOUNTER — Ambulatory Visit (HOSPITAL_COMMUNITY)
Admission: EM | Admit: 2023-04-10 | Discharge: 2023-04-10 | Disposition: A | Discharge status: Home / Self Care | Payer: Medicaid Other | Attending: Emergency Medicine | Admitting: Emergency Medicine

## 2023-04-10 ENCOUNTER — Encounter (HOSPITAL_COMMUNITY): Payer: Self-pay

## 2023-04-10 DIAGNOSIS — R0602 Shortness of breath: Secondary | ICD-10-CM

## 2023-04-10 DIAGNOSIS — K5900 Constipation, unspecified: Secondary | ICD-10-CM

## 2023-04-10 DIAGNOSIS — H9203 Otalgia, bilateral: Secondary | ICD-10-CM | POA: Diagnosis not present

## 2023-04-10 DIAGNOSIS — I509 Heart failure, unspecified: Secondary | ICD-10-CM

## 2023-04-10 MED ORDER — FLUTICASONE PROPIONATE 50 MCG/ACT NA SUSP: 2.0000 | Freq: Every day | NASAL | 2 refills | Status: AC

## 2023-04-10 MED ORDER — POLYETHYLENE GLYCOL 4500 POWD: 17.0000 g | Freq: Every day | 0 refills | Status: DC

## 2023-04-10 MED ORDER — ALBUTEROL SULFATE HFA 108 (90 BASE) MCG/ACT IN AERS
2.0000 | INHALATION_SPRAY | Freq: Once | RESPIRATORY_TRACT | Status: AC
  Administered 2023-04-10: 2 via RESPIRATORY_TRACT

## 2023-04-10 MED ORDER — ALBUTEROL SULFATE HFA 108 (90 BASE) MCG/ACT IN AERS
INHALATION_SPRAY | RESPIRATORY_TRACT | Status: AC
  Filled 2023-04-10: qty 6.7

## 2023-04-10 NOTE — ED Provider Notes (Signed)
[facility]    CSN: [id number] Arrival date & time: 04/10/23  1439     History   Chief Complaint Chief Complaint  Patient presents with   Shortness of Breath    HPI Chelsea Branch is a 57 y.o. female.  Patient with several concerns today  Reporting shortness of breath for 2 days, only at night when laying flat. No symptoms currently. She has history of COPD and HF. Not having cough or fever. No leg swelling. She is requesting albuterol inhaler today She has not been taking her torsemide. Has a cardiology appointment next month.   Reporting straining with BM, hard small stools for a month or so. She had bright red blood after wiping with one BM a few days ago Not having abdominal pain. No nausea or vomiting. Has not taken any medications for this  Also having bilateral ear pain. No congestion or drainage She is requesting a "shot" for ear pain Used tylenol about 2 hours ago  She does have a PCP and saw them last month   Past Medical History:  Diagnosis Date   Abnormal thyroid blood test 03/15/2017   Abnormal uterine bleeding    Alopecia    Anemia    Angina pectoris with normal coronary arteriogram (HCC) 2017   Had + Troponin c/w ? 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 iniatially ?2/2 peripartum in 1994 - improved by 2008 then worsening EF in 2011 back down to EF 25-30%. b. echo 01/21/14 showed mod LVH, [name]; 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   Peripartum  cardiomyopathy 1994   Sleep apnea 2015   CPAP 12/2013   Stroke (HCC) 2011   Systolic and diastolic CHF, acute on chronic (HCC) 06/14/2015   Tobacco abuse     Patient Active Problem List   Diagnosis Date Noted   Liletta IUD (intrauterine device) in place since 12/15/2015 04/06/2023   Bacterial vaginitis 01/13/2023   External otitis of both ears due to fungus 10/07/2022   Chronic pain syndrome 12/28/2021   Primary osteoarthritis of both knees 12/28/2021   Spinal stenosis of lumbar region 07/21/2021   Lumbar radiculitis 07/21/2021   COPD (chronic obstructive pulmonary disease) (HCC) 07/21/2021   Mechanical complication due to intrauterine contraceptive device 07/21/2021   Arthropathy of lumbar facet joint 03/14/2019   Degenerative disc disease at L5-S1 level 10/10/2018   Low TSH level 04/26/2016   Abnormal uterine bleeding 04/06/2016   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   History of non-ST elevation myocardial infarction (NSTEMI) 06/14/2015   Homelessness 09/16/2014   Vitamin D deficiency 01/29/2014   OSA (obstructive sleep apnea) 01/08/2014   Chronic combined systolic and diastolic CHF (congestive heart failure) (HCC) 08/14/2013   Essential hypertension    Fibroids 08/09/2012   Menorrhagia 05/08/2012   Female pattern hair loss    HLD (hyperlipidemia) 06/08/2010   CEREBRAL ANEURYSM 06/08/2010   History of cardiovascular disorder 06/08/2010    Past Surgical History:  Procedure  Laterality Date   CARDIAC CATHETERIZATION N/A 06/16/2015   Procedure: Left Heart Cath and Coronary Angiography;  Surgeon: Corky Crafts, MD;  Location: [facility] INVASIVE CV LAB;  Service: Cardiovascular;  Laterality: N/A;   CESAREAN SECTION  1992  1994   LOOP RECORDER IMPLANT  ~ 2000   TIBIAL TUBERCLERPLASTY     TUBAL LIGATION  1994    OB History     Gravida  2   Para  2   Term  2   Preterm      AB      Living  3      SAB      IAB      Ectopic      Multiple  1    Live Births  3            Home Medications    Prior to Admission medications   Medication Sig Start Date End Date Taking? Authorizing Provider  fluticasone (FLONASE) 50 MCG/ACT nasal spray Place 2 sprays into both nostrils daily. 04/10/23  Yes Harsh Trulock, Lurena Joiner, PA-C  Polyethylene Glycol 4500 POWD 17 g by Does not apply route daily at 6 (six) AM. Dissolve 1 capful of powder in one 8 ounce glass of water. Repeat daily or as needed to soften stool 04/10/23  Yes Jaylanie Boschee, Lurena Joiner, PA-C  albuterol (VENTOLIN HFA) 108 (90 Base) MCG/ACT inhaler Inhale 2 puffs into the lungs every 4 (four) hours as needed for wheezing or shortness of breath. 01/12/23   Storm Frisk, MD  atorvastatin (LIPITOR) 10 MG tablet Take 1 tablet (10 mg total) by mouth daily. 01/12/23   Storm Frisk, MD  Blood Pressure Monitoring (BLOOD PRESSURE KIT) DEVI Use to measure blood pressure twice daily 07/22/21   Storm Frisk, MD  diclofenac Sodium (VOLTAREN) 1 % GEL Apply 4 g topically 4 (four) times daily. 03/19/23   Grayce Sessions, NP  levocetirizine (XYZAL) 5 MG tablet Take 1 tablet (5 mg total) by mouth every evening. 01/12/23 07/11/23  Storm Frisk, MD  lidocaine (LIDODERM) 5 % Place 1 patch onto the skin daily. Remove & Discard patch within 12 hours or as directed by MD 03/19/23   Grayce Sessions, NP  magnesium oxide (MAG-OX) 400 MG tablet TAKE 1 CAPSULE (400 MG TOTAL) BY MOUTH 2 (TWO) TIMES DAILY. 01/12/23   Storm Frisk, MD  metoprolol succinate (TOPROL-XL) 50 MG 24 hr tablet Take 1 tablet (50 mg total) by mouth daily. Please keep upcoming appointment in April 2024 for future refills. Thank you 01/12/23   Storm Frisk, MD  metroNIDAZOLE (FLAGYL) 500 MG tablet Take 1 tablet (500 mg total) by mouth 2 (two) times daily. 03/22/23   Grayce Sessions, NP  Misc. Devices MISC Please provide patient with insurance approved straight cane 09/29/18   Claiborne Rigg, NP  Misc. Devices MISC Single-point cane  with adjustable height x1, shower chair x1, transfer bench x1.  Diagnosis congestive heart failure, degenerative disc disease of the lumbar spine. 11/23/18   [name], MD  mupirocin ointment (BACTROBAN) 2 % Apply 1 Application topically 3 (three) times daily. To area on vagina 11/22/22   Crain, Whitney L, PA  ondansetron (ZOFRAN-ODT) 4 MG disintegrating tablet Take 1 tablet (4 mg total) by mouth every 8 (eight) hours as needed for nausea or vomiting. 02/01/23   Valentino Nose, NP  potassium chloride SA (KLOR-CON M20) 20 MEQ tablet TAKE 2 TABLETS IN THE MORNING AND 1 TABLET  IN THE EVENING AS DIRECTED 01/12/23   Storm Frisk, MD  PROGESTERONE PO Take by mouth.    [provider]  sacubitril-valsartan (ENTRESTO) 24-26 MG Take 1 tablet by mouth 2 (two) times daily. 01/12/23   Storm Frisk, MD  spironolactone (ALDACTONE) 25 MG tablet Take 0.5 tablets (12.5 mg total) by mouth daily. 01/12/23   Storm Frisk, MD  torsemide (DEMADEX) 20 MG tablet Take 1 tablet (20 mg total) by mouth daily. 01/12/23   Storm Frisk, MD  DULoxetine (CYMBALTA) 30 MG capsule Take 1 capsule (30 mg total) by mouth daily. 12/07/18 12/10/19  [name], MD  famotidine (PEPCID) 20 MG tablet Take 1 tablet (20 mg total) 2 (two) times daily by mouth. 04/10/17 11/03/19  Elisha Ponder, PA-C    Family History Family History  Problem Relation Age of Onset   Cancer Maternal Grandmother        uterine   Hypertension Sister    Healthy Mother    Other Neg Hx    Heart disease Neg Hx     Social History Social History   Tobacco Use   Smoking status: Former    Current packs/day: 0.00    Average packs/day: 0.1 packs/day for 30.0 years (3.0 ttl pk-yrs)    Types: Cigarettes    Start date: 06/02/1985    Quit date: 06/03/2015    Years since quitting: 7.8   Smokeless tobacco: Never   Tobacco comments:    Pt. stated she stopped smoking a year ago. 09/29/2018  Vaping Use   Vaping status: Never Used   Substance Use Topics   Alcohol use: No    Alcohol/week: 0.0 standard drinks of alcohol   Drug use: No     Allergies   Ace inhibitors   Review of Systems Review of Systems Per HPI  Physical Exam Triage Vital Signs ED Triage Vitals [04/10/23 1615]  Encounter Vitals Group     BP 132/87     Systolic BP Percentile      Diastolic BP Percentile      Pulse Rate 84     Resp 16     Temp 99.1 F (37.3 C)     Temp Source Oral     SpO2 96 %     Weight      Height      Head Circumference      Peak Flow      Pain Score 8     Pain Loc      Pain Education      Exclude from Growth Chart    No data found.  Updated Vital Signs BP 132/87 (BP Location: Right Arm)   Pulse 84   Temp 99.1 F (37.3 C) (Oral)   Resp 16   SpO2 96%    Physical Exam Vitals and nursing note reviewed.  Constitutional:      General: She is not in acute distress.    Appearance: She is not ill-appearing.  HENT:     Right Ear: Tympanic membrane, ear canal and external ear normal.     Left Ear: Tympanic membrane, ear canal and external ear normal.     Nose: No congestion or rhinorrhea.     Mouth/Throat:     Mouth: Mucous membranes are moist.     Pharynx: Oropharynx is clear. No posterior oropharyngeal erythema.  Eyes:     Conjunctiva/sclera: Conjunctivae normal.  Cardiovascular:     Rate and Rhythm: Normal rate and regular rhythm.  Pulses: Normal pulses.     Heart sounds: Normal heart sounds, S1 normal and S2 normal. No murmur heard. Pulmonary:     Effort: Pulmonary effort is normal.     Breath sounds: Normal breath sounds.  Abdominal:     General: There is no distension.     Palpations: Abdomen is soft. There is no mass.     Tenderness: There is no abdominal tenderness. There is no guarding.  Musculoskeletal:        General: Normal range of motion.     Cervical back: Normal range of motion.     Right lower leg: No edema.     Left lower leg: No edema.  Lymphadenopathy:     Cervical: No  cervical adenopathy.  Skin:    General: Skin is warm and dry.  Neurological:     Mental Status: She is alert and oriented to person, place, and time.     UC Treatments / Results  Labs (all labs ordered are listed, but only abnormal results are displayed) Labs Reviewed - No data to display  EKG  Radiology No results found.  Procedures Procedures (including critical care time)  Medications Ordered in UC Medications  albuterol (VENTOLIN HFA) 108 (90 Base) MCG/ACT inhaler 2 puff (2 puffs Inhalation Given 04/10/23 1654)    Initial Impression / Assessment and Plan / UC Course  I have reviewed the triage vital signs and the nursing notes.  Pertinent labs & imaging results that were available during my care of the patient were reviewed by me and considered in my medical decision making (see chart for details).  Well appearing. No acute distress. Sating 96% on room air. No signs of fluid overload Discussed needs to restart her torsemide, prop up at night, contact heart doc. Strict ED precautions  Constipation Miralax BID with increased fluids No red flags  Ear pain No sign of infection. Recommend using nasal spray daily, can continue tylenol if helpful  PCP follow up  Final Clinical Impressions(s) / UC Diagnoses   Final diagnoses:  Shortness of breath  Heart failure, unspecified HF chronicity, unspecified heart failure type (HCC)  Otalgia of both ears  Constipation, unspecified constipation type     Discharge Instructions      For ear pain; use the flonase nasal spray daily You can continue tylenol  For constipation; use miralax. Dissolve one capful in a glass of water twice daily Drink lots of water!  For the shortness of breath at night; I believe this is related to your heart failure. Prop yourself up at night and do not lay flat. Please restart taking your torsemide once daily. Contact your heart doctor for follow up. You have an appointment on 12/17  Please  go to the emergency department if symptoms worsen.     ED Prescriptions     Medication Sig Dispense Auth. Provider   Polyethylene Glycol 4500 POWD 17 g by Does not apply route daily at 6 (six) AM. Dissolve 1 capful of powder in one 8 ounce glass of water. Repeat daily or as needed to soften stool 500 g Michaele Amundson, PA-C   fluticasone (FLONASE) 50 MCG/ACT nasal spray Place 2 sprays into both nostrils daily. 9.9 mL Zarielle Cea, Lurena Joiner, PA-C      PDMP not reviewed this encounter.   Marlow Baars, [place] 04/10/23 [id number]

## 2023-04-10 NOTE — Discharge Instructions (Addendum)
For ear pain; use the flonase nasal spray daily You can continue tylenol  For constipation; use miralax. Dissolve one capful in a glass of water twice daily Drink lots of water!  For the shortness of breath at night; I believe this is related to your heart failure. Prop yourself up at night and do not lay flat. Please restart taking your torsemide once daily. Contact your heart doctor for follow up. You have an appointment on 12/17  Please go to the emergency department if symptoms worsen.

## 2023-04-10 NOTE — ED Triage Notes (Addendum)
Patient has multiple complaints.  Patient c/o SOB x 2 days. Patient states SOB worse at night.   Patient states she is constipated and having a hard time pushing the stool out. Patient states she had a BM Friday, yesterday, and one this AM. Patient states, "I pushed so hard that I had blood in my feces."  Patient also c/o bilateral ear pain.  Patient states, "I need a shot for pain and I want medicine for home so I can do my own breathing treatments. Patient states she has never had a prescription for this.   Patient states she took Tylenol at 1400 today.

## 2023-04-11 ENCOUNTER — Emergency Department (HOSPITAL_COMMUNITY): Payer: Medicaid Other

## 2023-04-11 ENCOUNTER — Other Ambulatory Visit: Payer: Self-pay

## 2023-04-11 ENCOUNTER — Inpatient Hospital Stay (HOSPITAL_COMMUNITY): Payer: Medicaid Other

## 2023-04-11 ENCOUNTER — Inpatient Hospital Stay (HOSPITAL_COMMUNITY)
Admission: EM | Admit: 2023-04-11 | Discharge: 2023-04-13 | DRG: 061 | Disposition: A | Discharge status: Home / Self Care | Payer: Medicaid Other | Attending: Neurology | Admitting: Neurology

## 2023-04-11 DIAGNOSIS — I420 Dilated cardiomyopathy: Secondary | ICD-10-CM | POA: Diagnosis not present

## 2023-04-11 DIAGNOSIS — I639 Cerebral infarction, unspecified: Secondary | ICD-10-CM | POA: Diagnosis not present

## 2023-04-11 DIAGNOSIS — R682 Dry mouth, unspecified: Secondary | ICD-10-CM | POA: Diagnosis present

## 2023-04-11 DIAGNOSIS — Q0700 Arnold-Chiari syndrome without spina bifida or hydrocephalus: Secondary | ICD-10-CM | POA: Diagnosis not present

## 2023-04-11 DIAGNOSIS — I63549 Cerebral infarction due to unspecified occlusion or stenosis of unspecified cerebellar artery: Principal | ICD-10-CM | POA: Diagnosis present

## 2023-04-11 DIAGNOSIS — I618 Other nontraumatic intracerebral hemorrhage: Secondary | ICD-10-CM | POA: Diagnosis not present

## 2023-04-11 DIAGNOSIS — M51369 Other intervertebral disc degeneration, lumbar region without mention of lumbar back pain or lower extremity pain: Secondary | ICD-10-CM | POA: Diagnosis present

## 2023-04-11 DIAGNOSIS — E785 Hyperlipidemia, unspecified: Secondary | ICD-10-CM | POA: Diagnosis present

## 2023-04-11 DIAGNOSIS — Z6829 Body mass index (BMI) 29.0-29.9, adult: Secondary | ICD-10-CM

## 2023-04-11 DIAGNOSIS — G8321 Monoplegia of upper limb affecting right dominant side: Secondary | ICD-10-CM | POA: Diagnosis not present

## 2023-04-11 DIAGNOSIS — G4733 Obstructive sleep apnea (adult) (pediatric): Secondary | ICD-10-CM | POA: Diagnosis present

## 2023-04-11 DIAGNOSIS — I69320 Aphasia following cerebral infarction: Secondary | ICD-10-CM | POA: Diagnosis not present

## 2023-04-11 DIAGNOSIS — R29707 NIHSS score 7: Secondary | ICD-10-CM | POA: Diagnosis not present

## 2023-04-11 DIAGNOSIS — R4701 Aphasia: Secondary | ICD-10-CM

## 2023-04-11 DIAGNOSIS — H538 Other visual disturbances: Secondary | ICD-10-CM | POA: Diagnosis not present

## 2023-04-11 DIAGNOSIS — H53461 Homonymous bilateral field defects, right side: Secondary | ICD-10-CM | POA: Diagnosis present

## 2023-04-11 DIAGNOSIS — L659 Nonscarring hair loss, unspecified: Secondary | ICD-10-CM | POA: Diagnosis not present

## 2023-04-11 DIAGNOSIS — Z5986 Financial insecurity: Secondary | ICD-10-CM

## 2023-04-11 DIAGNOSIS — Z87891 Personal history of nicotine dependence: Secondary | ICD-10-CM

## 2023-04-11 DIAGNOSIS — Z79899 Other long term (current) drug therapy: Secondary | ICD-10-CM

## 2023-04-11 DIAGNOSIS — R29702 NIHSS score 2: Secondary | ICD-10-CM | POA: Diagnosis not present

## 2023-04-11 DIAGNOSIS — Z8673 Personal history of transient ischemic attack (TIA), and cerebral infarction without residual deficits: Secondary | ICD-10-CM | POA: Diagnosis present

## 2023-04-11 DIAGNOSIS — Z5982 Transportation insecurity: Secondary | ICD-10-CM

## 2023-04-11 DIAGNOSIS — I5042 Chronic combined systolic (congestive) and diastolic (congestive) heart failure: Secondary | ICD-10-CM | POA: Diagnosis not present

## 2023-04-11 DIAGNOSIS — E876 Hypokalemia: Secondary | ICD-10-CM | POA: Diagnosis present

## 2023-04-11 DIAGNOSIS — Z823 Family history of stroke: Secondary | ICD-10-CM | POA: Diagnosis not present

## 2023-04-11 DIAGNOSIS — J432 Centrilobular emphysema: Secondary | ICD-10-CM | POA: Diagnosis not present

## 2023-04-11 DIAGNOSIS — E669 Obesity, unspecified: Secondary | ICD-10-CM | POA: Diagnosis present

## 2023-04-11 DIAGNOSIS — I10 Essential (primary) hypertension: Secondary | ICD-10-CM | POA: Diagnosis present

## 2023-04-11 DIAGNOSIS — I2510 Atherosclerotic heart disease of native coronary artery without angina pectoris: Secondary | ICD-10-CM | POA: Diagnosis not present

## 2023-04-11 DIAGNOSIS — R519 Headache, unspecified: Secondary | ICD-10-CM | POA: Diagnosis not present

## 2023-04-11 DIAGNOSIS — R7303 Prediabetes: Secondary | ICD-10-CM | POA: Diagnosis not present

## 2023-04-11 DIAGNOSIS — Z5941 Food insecurity: Secondary | ICD-10-CM

## 2023-04-11 DIAGNOSIS — Z888 Allergy status to other drugs, medicaments and biological substances status: Secondary | ICD-10-CM

## 2023-04-11 DIAGNOSIS — I252 Old myocardial infarction: Secondary | ICD-10-CM | POA: Diagnosis not present

## 2023-04-11 DIAGNOSIS — Z560 Unemployment, unspecified: Secondary | ICD-10-CM

## 2023-04-11 DIAGNOSIS — I110 Hypertensive heart disease with heart failure: Secondary | ICD-10-CM | POA: Diagnosis present

## 2023-04-11 DIAGNOSIS — Z8249 Family history of ischemic heart disease and other diseases of the circulatory system: Secondary | ICD-10-CM

## 2023-04-11 DIAGNOSIS — I6389 Other cerebral infarction: Secondary | ICD-10-CM | POA: Diagnosis not present

## 2023-04-11 DIAGNOSIS — R29818 Other symptoms and signs involving the nervous system: Secondary | ICD-10-CM | POA: Diagnosis not present

## 2023-04-11 DIAGNOSIS — Z8679 Personal history of other diseases of the circulatory system: Secondary | ICD-10-CM

## 2023-04-11 DIAGNOSIS — D259 Leiomyoma of uterus, unspecified: Secondary | ICD-10-CM | POA: Diagnosis present

## 2023-04-11 LAB — URINALYSIS, ROUTINE W REFLEX MICROSCOPIC
Bacteria, UA: NONE SEEN
Bilirubin Urine: NEGATIVE
Glucose, UA: NEGATIVE mg/dL
Hgb urine dipstick: NEGATIVE
Ketones, ur: NEGATIVE mg/dL
Nitrite: NEGATIVE
Protein, ur: NEGATIVE mg/dL
Specific Gravity, Urine: 1.024 (ref 1.005–1.030)
pH: 5 (ref 5.0–8.0)

## 2023-04-11 LAB — CBC WITH DIFFERENTIAL/PLATELET
Abs Immature Granulocytes: 0.01 10*3/uL (ref 0.00–0.07)
Basophils Absolute: 0 10*3/uL (ref 0.0–0.1)
Basophils Relative: 0 %
Eosinophils Absolute: 0 10*3/uL (ref 0.0–0.5)
Eosinophils Relative: 0 %
HCT: 44.6 % (ref 36.0–46.0)
Hemoglobin: 14.7 g/dL (ref 12.0–15.0)
Immature Granulocytes: 0 %
Lymphocytes Relative: 37 %
Lymphs Abs: 1.9 10*3/uL (ref 0.7–4.0)
MCH: 28.7 pg (ref 26.0–34.0)
MCHC: 33 g/dL (ref 30.0–36.0)
MCV: 86.9 fL (ref 80.0–100.0)
Monocytes Absolute: 0.4 10*3/uL (ref 0.1–1.0)
Monocytes Relative: 8 %
Neutro Abs: 2.9 10*3/uL (ref 1.7–7.7)
Neutrophils Relative %: 55 %
Platelets: 206 10*3/uL (ref 150–400)
RBC: 5.13 MIL/uL — ABNORMAL HIGH (ref 3.87–5.11)
RDW: 12.4 % (ref 11.5–15.5)
WBC: 5.3 10*3/uL (ref 4.0–10.5)
nRBC: 0 % (ref 0.0–0.2)

## 2023-04-11 LAB — HEMOGLOBIN A1C
Hgb A1c MFr Bld: 6.3 % — ABNORMAL HIGH (ref 4.8–5.6)
Mean Plasma Glucose: 134.11 mg/dL

## 2023-04-11 LAB — COMPREHENSIVE METABOLIC PANEL
ALT: 25 U/L (ref 0–44)
AST: 21 U/L (ref 15–41)
Albumin: 3.2 g/dL — ABNORMAL LOW (ref 3.5–5.0)
Alkaline Phosphatase: 68 U/L (ref 38–126)
Anion gap: 12 (ref 5–15)
BUN: 11 mg/dL (ref 6–20)
CO2: 26 mmol/L (ref 22–32)
Calcium: 8.9 mg/dL (ref 8.9–10.3)
Chloride: 99 mmol/L (ref 98–111)
Creatinine, Ser: 1.26 mg/dL — ABNORMAL HIGH (ref 0.44–1.00)
GFR, Estimated: 49 mL/min — ABNORMAL LOW (ref >60)
Glucose, Bld: 104 mg/dL — ABNORMAL HIGH (ref 70–99)
Potassium: 3.1 mmol/L — ABNORMAL LOW (ref 3.5–5.1)
Sodium: 137 mmol/L (ref 135–145)
Total Bilirubin: 1.1 mg/dL (ref <1.2)
Total Protein: 7.4 g/dL (ref 6.5–8.1)

## 2023-04-11 LAB — I-STAT CHEM 8, ED
BUN: 12 mg/dL (ref 6–20)
Calcium, Ion: 1.06 mmol/L — ABNORMAL LOW (ref 1.15–1.40)
Chloride: 98 mmol/L (ref 98–111)
Creatinine, Ser: 1.1 mg/dL — ABNORMAL HIGH (ref 0.44–1.00)
Glucose, Bld: 106 mg/dL — ABNORMAL HIGH (ref 70–99)
HCT: 47 % — ABNORMAL HIGH (ref 36.0–46.0)
Hemoglobin: 16 g/dL — ABNORMAL HIGH (ref 12.0–15.0)
Potassium: 3 mmol/L — ABNORMAL LOW (ref 3.5–5.1)
Sodium: 139 mmol/L (ref 135–145)
TCO2: 27 mmol/L (ref 22–32)

## 2023-04-11 LAB — RAPID URINE DRUG SCREEN, HOSP PERFORMED
Amphetamines: NOT DETECTED
Barbiturates: NOT DETECTED
Benzodiazepines: NOT DETECTED
Cocaine: NOT DETECTED
Opiates: NOT DETECTED
Tetrahydrocannabinol: NOT DETECTED

## 2023-04-11 LAB — CBG MONITORING, ED: Glucose-Capillary: 108 mg/dL — ABNORMAL HIGH (ref 70–99)

## 2023-04-11 LAB — HCG, SERUM, QUALITATIVE: Preg, Serum: NEGATIVE

## 2023-04-11 LAB — TROPONIN I (HIGH SENSITIVITY)
Troponin I (High Sensitivity): 25 ng/L — ABNORMAL HIGH (ref <18)
Troponin I (High Sensitivity): 22 ng/L — ABNORMAL HIGH (ref <18)

## 2023-04-11 LAB — HIV ANTIBODY (ROUTINE TESTING W REFLEX): HIV Screen 4th Generation wRfx: NONREACTIVE

## 2023-04-11 LAB — PROTIME-INR
INR: 1.1 (ref 0.8–1.2)
Prothrombin Time: 14.3 s (ref 11.4–15.2)

## 2023-04-11 LAB — APTT: aPTT: 33 s (ref 24–36)

## 2023-04-11 LAB — ETHANOL: Alcohol, Ethyl (B): <10 mg/dL (ref <10)

## 2023-04-11 MED ORDER — STROKE: EARLY STAGES OF RECOVERY BOOK
Freq: Once | Status: AC
  Filled 2023-04-11: qty 1

## 2023-04-11 MED ORDER — SODIUM CHLORIDE 0.9 % IV BOLUS
1000.0000 mL | Freq: Once | INTRAVENOUS | Status: AC
  Administered 2023-04-11: 1000 mL via INTRAVENOUS

## 2023-04-11 MED ORDER — ACETAMINOPHEN 325 MG PO TABS
650.0000 mg | ORAL_TABLET | ORAL | Status: DC | PRN
  Administered 2023-04-11 – 2023-04-13 (×3): 650 mg via ORAL
  Filled 2023-04-11 (×3): qty 2

## 2023-04-11 MED ORDER — IOHEXOL 350 MG/ML SOLN
75.0000 mL | Freq: Once | INTRAVENOUS | Status: AC | PRN
  Administered 2023-04-11: 75 mL via INTRAVENOUS

## 2023-04-11 MED ORDER — PANTOPRAZOLE SODIUM 40 MG IV SOLR
40.0000 mg | Freq: Every day | INTRAVENOUS | Status: DC
  Administered 2023-04-11: 40 mg via INTRAVENOUS
  Filled 2023-04-11: qty 10

## 2023-04-11 MED ORDER — SENNOSIDES-DOCUSATE SODIUM 8.6-50 MG PO TABS: 1.0000 | ORAL_TABLET | Freq: Every evening | ORAL | Status: DC | PRN

## 2023-04-11 MED ORDER — TENECTEPLASE FOR STROKE
0.2500 mg/kg | PACK | Freq: Once | INTRAVENOUS | Status: AC
  Administered 2023-04-11: 22 mg via INTRAVENOUS
  Filled 2023-04-11: qty 10

## 2023-04-11 MED ORDER — ACETAMINOPHEN 160 MG/5ML PO SOLN: 650.0000 mg | ORAL | Status: DC | PRN

## 2023-04-11 MED ORDER — ACETAMINOPHEN 650 MG RE SUPP: 650.0000 mg | RECTAL | Status: DC | PRN

## 2023-04-11 MED ORDER — SODIUM CHLORIDE 0.9 % IV SOLN: INTRAVENOUS | Status: AC

## 2023-04-11 NOTE — Code Documentation (Signed)
    Stroke Response Nurse Documentation Code Documentation  Meredith Pyles is a 57 y.o. female arriving to [facility]  via [employer] with past medical hx of CHF, HLD, HTN, NSTEMI, Stroke, OSA. Activated as code stroke by ED for blurred vision, h/a and new onset aphasia. LKW 1205 when patient at work and complaints of headache and blurred vision. While in ED reports of h/a and vision improving when approximately 1427 aphasia began and code stroke activated.   Stroke team met at bedside. Patient already scanned in CT. NIH 7, see flowsheet for details. MD discussed with son for consent for TNK. TNK given 1502. NIH/VS q46m x 2h, q76m x 6h , then q1h x 16h. SBP goal <180/105. Bedside handoff with ED RN Porfirio Mylar.    Scarlette Slice K  Rapid Response RN

## 2023-04-11 NOTE — Progress Notes (Signed)
PHARMACIST CODE STROKE RESPONSE  Notified to mix TNK at 1444 by Dr. Derry Lory TNK preparation completed at 1447  TNK dose = 22 mg IV over 5 seconds.  Dose given 1502  Issues/delays encountered (if applicable): Consent by family member, explanation of risks/benefits to medication administration  Rutherford Nail, PharmD PGY2 Critical Care Pharmacy Resident 04/11/23 3:06 PM

## 2023-04-11 NOTE — ED Provider Triage Note (Signed)
Emergency Medicine Provider Triage Evaluation Note  Kaleigh Herda , a 57 y.o. female  was evaluated in triage.  Pt complains of sudden onset severe headache on the right side of her head.  She does not get headaches.  This is the worst headache she is ever had.  She got extremely blurry in her vision and felt off balance.  She felt like her mouth was dry.  Her colleagues had to help her go sit down.  She denies any personal history of migraine headaches and no family history of brain aneurysms..  Review of Systems  Positive: Headache Negative: Fever  Physical Exam  BP (!) 122/96 (BP Location: Right Arm)   Pulse 91   Temp 98.3 F (36.8 C) (Oral)   Resp 17   Ht 5\' 8"  (1.727 m)   Wt 88.9 kg   SpO2 100%   BMI 29.80 kg/m  Gen:   Awake, no distress   Resp:  Normal effort  MSK:   Moves extremities without difficulty  Other:  No objective neurologic deficits at this time  Medical Decision Making  Medically screening exam initiated at 1:39 PM.  Appropriate orders placed.  Raewyn Aumick was informed that the remainder of the evaluation will be completed by another provider, this initial triage assessment does not replace that evaluation, and the importance of remaining in the ED until their evaluation is complete.  Code medical ordered to rule out cranial hemorrhage.  CT angiogram of the head and neck ordered.  Patient moved back immediately for further evaluation   Arthor Captain, PA-C 04/11/23 1342

## 2023-04-11 NOTE — ED Triage Notes (Signed)
Patient developed headache, blurry vision, and dry mouth around noon today while at work. Vision improving at time of triage.

## 2023-04-11 NOTE — H&P (Signed)
NEUROLOGY H&P NOTE   Date of service: April 11, 2023 Patient Name: Chelsea Branch MRN:  [id number] DOB:  1966/05/08 Chief Complaint: "CODE STROKE"  History of Present Illness  Chelsea Branch is a 57 y.o. female with PMH significant for Stroke 2011, OSA, Smoker, Chronic combined CHF, HTN, NSTEMI 2017, NIDCM who presented to the ED d/t change in mental status/"feeling off", blurred vision, headache, dry mouth while at work. Patient was able to describe all of this while in triage with no aphasia or dysarthria present and vision was improving, per notes. Patient was taken for CT/CTA, which was negative.  At approximately 1427, patient went from talking and interacting normally with nursing staff to having expressive aphasia and a CODE STROKE was activated.  On exam at bedside, patient with RUE weakness, confusion and receptive/expressive aphasia. NIH: 7. Management with thrombolytic therapy was explained to the patient, and patient's son Stann Ore over the phone, as were risks, benefits and alternatives. All questions were answered. Patient, or patient's representative, expressed understanding of the treatment plan and agreed to proceed with thrombolytic treatment.TNK given at 1502.   LKW: 1427 TNK given? Yes, 1502. Mechanical thrombectomy? No, no LVO  Risks, benefits, alternatives of IV thrombolysis were discussed and family/patient agreed to proceed. CT imaging was reviewed personally prior to IV thrombolysis administration with no evidence of bleed. Premorbid modified Rankin scale (mRS): 0   NIHSS components Score: Comment  1a Level of Conscious 0[x]  1[]  2[]  3[]      1b LOC Questions 0[]  1[]  2[x]       1c LOC Commands 0[]  1[]  2[x]       2 Best Gaze 0[x]  1[]  2[]       3 Visual 0[x]  1[]  2[]  3[]      4 Facial Palsy 0[x]  1[]  2[]  3[]      5a Motor Arm - left 0[x]  1[]  2[]  3[]  4[]  UN[]    5b Motor Arm - Right 0[]  1[x]  2[]  3[]  4[]  UN[]    6a Motor Leg - Left 0[x]  1[]  2[]  3[]  4[]  UN[]    6b Motor Leg -  Right 0[x]  1[]  2[]  3[]  4[]  UN[]    7 Limb Ataxia 0[x]  1[]  2[]  3[]  UN[]     8 Sensory 0[x]  1[]  2[]  UN[]      9 Best Language 0[]  1[]  2[x]  3[]      10 Dysarthria 0[x]  1[]  2[]  UN[]      11 Extinct. and Inattention 0[x]  1[]  2[]       TOTAL:   7      ROS   Unable to perform due to aphasia.  Past History   Past Medical History:  Diagnosis Date   Abnormal thyroid blood test 03/15/2017   Abnormal uterine bleeding    Alopecia    Anemia    Angina pectoris with normal coronary arteriogram (HCC) 2017   Had + Troponin c/w ? 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 iniatially ?2/2 peripartum in 1994 - improved by 2008 then worsening EF in 2011 back down to EF 25-30%. b. echo 01/21/14 showed mod LVH, [name]; 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   Peripartum cardiomyopathy 1994   Sleep apnea 2015   CPAP 12/2013   Stroke (HCC) 2011   Systolic and diastolic CHF, acute on chronic (HCC) 06/14/2015   Tobacco abuse    Past Surgical History:  Procedure Laterality Date   CARDIAC CATHETERIZATION N/A 06/16/2015   Procedure: Left Heart Cath and Coronary Angiography;  Surgeon: Corky Crafts, MD;  Location: [facility] INVASIVE CV LAB;  Service: Cardiovascular;  Laterality: N/A;   CESAREAN SECTION  1992  1994   LOOP RECORDER IMPLANT  ~ 2000   TIBIAL TUBERCLERPLASTY     TUBAL LIGATION  1994   Family History  Problem Relation Age of Onset   Cancer Maternal Grandmother        uterine   Hypertension Sister    Healthy Mother     Other Neg Hx    Heart disease Neg Hx    Social History   Socioeconomic History   Marital status: Single    Spouse name: Not on file   Number of children: 3   Years of education: 12   Highest education level: Not on file  Occupational History   Occupation: unemployed  Tobacco Use   Smoking status: Former    Current packs/day: 0.00    Average packs/day: 0.1 packs/day for 30.0 years (3.0 ttl pk-yrs)    Types: Cigarettes    Start date: 06/02/1985    Quit date: 06/03/2015    Years since quitting: 7.8   Smokeless tobacco: Never   Tobacco comments:    Pt. stated she stopped smoking a year ago. 09/29/2018  Vaping Use   Vaping status: Never Used  Substance and Sexual Activity   Alcohol use: No    Alcohol/week: 0.0 standard drinks of alcohol   Drug use: No   Sexual activity: Yes    Birth control/protection: I.U.D.    Comment: fibroids  Other Topics Concern   Not on file  Social History Narrative   Lives at home with 58 yo twins ([place] and [place])   55 yo son lives outside the home   27 yo granddaughter    Social Determinants of Health   Financial Resource Strain: High Risk (03/19/2023)   Overall Financial Resource Strain (CARDIA)    Difficulty of Paying Living Expenses: Very hard  Food Insecurity: Food Insecurity Present (03/19/2023)   Hunger Vital Sign    Worried About Running Out of Food in the Last Year: Sometimes true    Ran Out of Food in the Last Year: Sometimes true  Transportation Needs: Unmet Transportation Needs (03/19/2023)   PRAPARE - Administrator, Civil Service (Medical): Yes    Lack of Transportation (Non-Medical): Yes  Physical Activity: Not on file  Stress: Stress Concern Present (03/19/2023)   [employer] of Occupational Health - Occupational Stress Questionnaire    Feeling of Stress : Rather much  Social Connections: Socially Isolated (03/19/2023)   Social Connection and Isolation Panel [NHANES]    Frequency of Communication with  Friends and Family: Once a week    Frequency of Social Gatherings with Friends and Family: Once a week    Attends Religious Services: Never    Database administrator or Organizations: No    Attends Banker Meetings: Never    Marital Status: Living with partner   Allergies  Allergen Reactions   Ace Inhibitors Other (See Comments)    REACTION:  Cough    Medications   Current Facility-Administered Medications:    [START ON 04/12/2023]  stroke: early stages of recovery book, , Does not apply, Once, Erick Blinks, MD   0.9 %  sodium chloride infusion, , Intravenous, Continuous, Erick Blinks, MD   acetaminophen (TYLENOL) tablet 650 mg, 650 mg, Oral, Q4H PRN **OR** acetaminophen (TYLENOL) 160 MG/5ML solution 650 mg, 650 mg, Per Tube, Q4H PRN **OR** acetaminophen (TYLENOL) suppository 650 mg, 650 mg, Rectal, Q4H PRN, Erick Blinks, MD   pantoprazole (PROTONIX) injection 40 mg, 40 mg, Intravenous, QHS, Erick Blinks, MD   senna-docusate (Senokot-S) tablet 1 tablet, 1 tablet, Oral, QHS PRN, Erick Blinks, MD   Vitals   Vitals:   04/11/23 1510 04/11/23 1515 04/11/23 1520 04/11/23 1525  BP: (!) 145/91 (!) 139/91 (!) 129/99 (!) 150/90  Pulse: 84 81 86 82  Resp: 15 17 19  (!) 21  Temp:      TempSrc:      SpO2: 97% 98% 99% 98%  Weight:      Height:         Body mass index is 29.8 kg/m.  Physical Exam   Constitutional: Appears well-developed and well-nourished.  Psych: Affect appropriate to situation.  HHENT: [place]/AT Cardiovascular: Normal rate and regular rhythm.  Respiratory: Effort normal, non-labored breathing.  GI: Soft.  No distension. There is no tenderness.  Skin: WDI.   Neurologic Examination   Neuro: Mental Status: Patient is awake, alert, oriented only to person and place.  Expressive and receptive aphasia present. Does not follow commands.  Cranial Nerves: II: Visual Fields are full. Pupils are equal, round, and reactive to light.    III,IV, VI: EOMI without ptosis or diploplia.  V: Facial sensation is symmetric to temperature VII: Facial movement is symmetric.  VIII: hearing is intact to voice X: Uvula elevates symmetrically XI: Shoulder shrug is symmetric. XII: tongue is midline without atrophy or fasciculations.  Motor: Tone is normal. Bulk is normal.  RUE: 4/5 with drift   Sensory: Sensation is symmetric to light touch and temperature in the arms and legs. Cerebellar: FNF intact bilaterally  Labs   CBC:  Recent Labs  Lab 04/11/23 1351 04/11/23 1359  WBC 5.3  --   NEUTROABS 2.9  --   HGB 14.7 16.0*  HCT 44.6 47.0*  MCV 86.9  --   PLT 206  --    Basic Metabolic Panel:  Lab Results  Component Value Date   NA 139 04/11/2023   K 3.0 (L) 04/11/2023   CO2 26 04/11/2023   GLUCOSE 106 (H) 04/11/2023   BUN 12 04/11/2023   CREATININE 1.10 (H) 04/11/2023   CALCIUM 8.9 04/11/2023   GFRNONAA 49 (L) 04/11/2023   GFRAA 71 10/11/2019   Lipid Panel:  Lab Results  Component Value Date   LDLCALC 85 07/21/2021   HgbA1c:  Lab Results  Component Value Date   HGBA1C 5.8 (H) 06/14/2015   Urine Drug Screen:     Component Value Date/Time   LABOPIA NONE DETECTED 04/11/2023 1459   COCAINSCRNUR NONE DETECTED 04/11/2023 1459   LABBENZ NONE DETECTED 04/11/2023 1459   AMPHETMU NONE DETECTED 04/11/2023 1459   THCU NONE DETECTED 04/11/2023 1459   LABBARB NONE DETECTED 04/11/2023 1459    Alcohol Level No results found for: "ETH" INR  Lab Results  Component Value Date   INR 1.1 04/11/2023   APTT  Lab Results  Component Value Date   APTT 33 04/11/2023     CT Head without  contrast(Personally reviewed): No acute intracranial abnormality.  CT angio Head and Neck with contrast(Personally reviewed): No intracranial large vessel occlusion or significant stenosis. No hemodynamically significant stenosis in the neck.  MRI Brain(Personally reviewed): PENDING   Assessment   Chelsea Branch is a 57 y.o.  female with PMH significant for Stroke 2011, OSA, Smoker, Chronic combined CHF, HTN, NSTEMI 2017 who was given TNK due to aphasia, RUE weakness, confusion.   Recommendations  - Frequent Neuro checks per stroke unit protocol - MRI Brain stroke protocol - 24 hour post-TNK CT Scan - STAT CT with any acute change in neuro status - Vascular imaging - CTA completed - TTE - Lipid panel - Statin - will be started if LDL>70 or otherwise medically indicated - A1C - Antithrombotic - No antithrombotic until 24hr post-TNK imaging is stable.  - DVT ppx - SCDs - Smoking cessation - will counsel patient - NIH/VS q66m x 2h, q24m x 6h , then q1h x 16h  - SBP goal - <180/105, PRN labetalol if HR>60 and PRN Hydralazine if HR<60 - Telemetry monitoring for arrhythmia - 72h - Swallow screen - will be performed prior to PO intake - Stroke education - will be given - PT/OT/SLP - Dispo: ICU  _______________________________________________________________  Pt seen by Neuro NP/APP and later by MD. Note/plan to be edited by MD as needed.    Lynnae January, DNP, AGACNP-BC [facility] Please use AMION for contact information & EPIC for messaging.   NEUROHOSPITALIST ADDENDUM Performed a face to face diagnostic evaluation.   I have reviewed the contents of history and physical exam as documented by PA/ARNP/Resident and agree with above documentation.  I have discussed and formulated the above plan as documented. Edits to the note have been made as needed.  Impression/Key exam findings/Plan: Presented with headache and subjective blurred vision initially but no focal deficit. About 7 mins prior to activation of code stroke, she developed word finding difficulty. She was talking to RN fine prior to this. On exam, she perseverates, has both receptive and expressive aphasia. Due to aphasia, patient is unable to consent for tnkase. I called her son Stann Ore to discuss risks and benefits of tnkase. He  requested that he wanted to talk to patient over phone prior to consenting for tnkase. I had to call him again from patient's room and put him on the phone with patient. I spoke with him in detail about risks, benefits, alternatives and mode of administration of tnkase. I discussed about 3-5% risk of ICH and about 30% chance of improvement in symptoms. Had to discuss ICH risks in much more detail and answer questions before Mr. Charmian Muff consented to tnkase. This did result in some delay in administration of tnkase.  This patient is critically ill and at significant risk of neurological worsening, death and care requires constant monitoring of vital signs, hemodynamics,respiratory and cardiac monitoring, neurological assessment, discussion with family, other specialists and medical decision making of high complexity. I spent 45 minutes of neurocritical care time  in the care of  this patient. This was time spent independent of any time provided by nurse practitioner or PA.  Erick Blinks [facility] 04/11/2023  5:22 PM   Erick Blinks, MD [facility] [phone]   If 7pm to 7am, please call on call as listed on AMION.

## 2023-04-11 NOTE — ED Notes (Signed)
Patient transported to CT w/ this RN. °

## 2023-04-11 NOTE — ED Provider Notes (Signed)
[name] EMERGENCY DEPARTMENT AT [facility] Provider Note   CSN: [id number] Arrival date & time: 04/11/23  1253     History  Chief Complaint  Patient presents with   Headache   Blurred Vision    Chelsea Branch is a 57 y.o. female.  57 yo F with a chief complaints of not feeling well.  She tells me that yesterday something happened but she has trouble explaining it.  Tells me that she had not felt well.  Right-sided headache.  Started about noon.  Denies trauma.  Denies cough congestion or fever.   Headache      Home Medications Prior to Admission medications   Medication Sig Start Date End Date Taking? Authorizing Provider  acetaminophen (TYLENOL) 500 MG tablet Take 500-1,000 mg by mouth daily as needed for mild pain (pain score 1-3) or moderate pain (pain score 4-6).   Yes [provider]  albuterol (VENTOLIN HFA) 108 (90 Base) MCG/ACT inhaler Inhale 2 puffs into the lungs every 4 (four) hours as needed for wheezing or shortness of breath. 01/12/23  Yes Storm Frisk, MD  atorvastatin (LIPITOR) 10 MG tablet Take 1 tablet (10 mg total) by mouth daily. 01/12/23  Yes Storm Frisk, MD  fluticasone (FLONASE) 50 MCG/ACT nasal spray Place 2 sprays into both nostrils daily. 04/10/23  Yes Rising, Lurena Joiner, PA-C  ibuprofen (ADVIL) 800 MG tablet Take 800 mg by mouth every 8 (eight) hours as needed for mild pain (pain score 1-3) or moderate pain (pain score 4-6).   Yes [provider]  metoprolol succinate (TOPROL-XL) 50 MG 24 hr tablet Take 1 tablet (50 mg total) by mouth daily. Please keep upcoming appointment in April 2024 for future refills. Thank you 01/12/23  Yes Storm Frisk, MD  Polyethylene Glycol 4500 POWD 17 g by Does not apply route daily at 6 (six) AM. Dissolve 1 capful of powder in one 8 ounce glass of water. Repeat daily or as needed to soften stool 04/10/23  Yes Rising, Lurena Joiner, PA-C  potassium chloride SA (KLOR-CON M20) 20 MEQ tablet  TAKE 2 TABLETS IN THE MORNING AND 1 TABLET IN THE EVENING AS DIRECTED 01/12/23  Yes Storm Frisk, MD  sacubitril-valsartan (ENTRESTO) 24-26 MG Take 1 tablet by mouth 2 (two) times daily. 01/12/23  Yes Storm Frisk, MD  spironolactone (ALDACTONE) 25 MG tablet Take 0.5 tablets (12.5 mg total) by mouth daily. 01/12/23  Yes Storm Frisk, MD  Blood Pressure Monitoring (BLOOD PRESSURE KIT) DEVI Use to measure blood pressure twice daily 07/22/21   Storm Frisk, MD  Misc. Devices MISC Please provide patient with insurance approved straight cane 09/29/18   Claiborne Rigg, NP  Misc. Devices MISC Single-point cane with adjustable height x1, shower chair x1, transfer bench x1.  Diagnosis congestive heart failure, degenerative disc disease of the lumbar spine. 11/23/18   [name], MD  torsemide (DEMADEX) 20 MG tablet Take 1 tablet (20 mg total) by mouth daily. 01/12/23   Storm Frisk, MD  DULoxetine (CYMBALTA) 30 MG capsule Take 1 capsule (30 mg total) by mouth daily. 12/07/18 12/10/19  [name], MD  famotidine (PEPCID) 20 MG tablet Take 1 tablet (20 mg total) 2 (two) times daily by mouth. 04/10/17 11/03/19  Aviva Kluver B, PA-C      Allergies    Ace inhibitors    Review of Systems   Review of Systems  Neurological:  Positive for headaches.    Physical Exam Updated  Vital Signs BP (!) 150/90   Pulse 82   Temp 98.3 F (36.8 C) (Oral)   Resp (!) 21   Ht 5\' 8"  (1.727 m)   Wt 88.9 kg   SpO2 98%   BMI 29.80 kg/m  Physical Exam Vitals and nursing note reviewed.  Constitutional:      General: She is not in acute distress.    Appearance: She is well-developed. She is not diaphoretic.  HENT:     Head: Normocephalic and atraumatic.  Eyes:     Pupils: Pupils are equal, round, and reactive to light.  Cardiovascular:     Rate and Rhythm: Normal rate and regular rhythm.     Heart sounds: No murmur heard.    No friction rub. No gallop.  Pulmonary:     Effort: Pulmonary  effort is normal.     Breath sounds: No wheezing or rales.  Abdominal:     General: There is no distension.     Palpations: Abdomen is soft.     Tenderness: There is no abdominal tenderness.  Musculoskeletal:        General: No tenderness.     Cervical back: Normal range of motion and neck supple.  Skin:    General: Skin is warm and dry.  Neurological:     Mental Status: She is alert and oriented to person, place, and time.     Comments: Patient has repetitive questions.  She has right upper extremity weakness compared to the left.  Has trouble following directions.  Psychiatric:        Behavior: Behavior normal.     ED Results / Procedures / Treatments   Labs (all labs ordered are listed, but only abnormal results are displayed) Labs Reviewed  COMPREHENSIVE METABOLIC PANEL - Abnormal; Notable for the following components:      Result Value   Potassium 3.1 (*)    Glucose, Bld 104 (*)    Creatinine, Ser 1.26 (*)    Albumin 3.2 (*)    GFR, Estimated 49 (*)    All other components within normal limits  CBC WITH DIFFERENTIAL/PLATELET - Abnormal; Notable for the following components:   RBC 5.13 (*)    All other components within normal limits  URINALYSIS, ROUTINE W REFLEX MICROSCOPIC - Abnormal; Notable for the following components:   Leukocytes,Ua TRACE (*)    All other components within normal limits  I-STAT CHEM 8, ED - Abnormal; Notable for the following components:   Potassium 3.0 (*)    Creatinine, Ser 1.10 (*)    Glucose, Bld 106 (*)    Calcium, Ion 1.06 (*)    Hemoglobin 16.0 (*)    HCT 47.0 (*)    All other components within normal limits  CBG MONITORING, ED - Abnormal; Notable for the following components:   Glucose-Capillary 108 (*)    All other components within normal limits  TROPONIN I (HIGH SENSITIVITY) - Abnormal; Notable for the following components:   Troponin I (High Sensitivity) 25 (*)    All other components within normal limits  PROTIME-INR  APTT   RAPID URINE DRUG SCREEN, HOSP PERFORMED  HCG, SERUM, QUALITATIVE  ETHANOL  HIV ANTIBODY (ROUTINE TESTING W REFLEX)  HEMOGLOBIN A1C  TROPONIN I (HIGH SENSITIVITY)    EKG EKG Interpretation Date/Time:  Monday April 11 2023 13:51:18 EST Ventricular Rate:  80 PR Interval:  163 QRS Duration:  110 QT Interval:  390 QTC Calculation: 450 R Axis:   5  Text Interpretation: Sinus rhythm  Ventricular premature complex Probable left atrial enlargement LVH with secondary repolarization abnormality No significant change since last tracing Confirmed by Melene Plan [phone]) on 04/11/2023 2:01:23 PM  Radiology CT ANGIO HEAD NECK W WO CM  Result Date: 04/11/2023 CLINICAL DATA:  Neuro deficit, acute, stroke suspected Sudden onst severe headache - no hx of same EXAM: CT ANGIOGRAPHY HEAD AND NECK WITH AND WITHOUT CONTRAST TECHNIQUE: Multidetector CT imaging of the head and neck was performed using the standard protocol during bolus administration of intravenous contrast. Multiplanar CT image reconstructions and MIPs were obtained to evaluate the vascular anatomy. Carotid stenosis measurements (when applicable) are obtained utilizing NASCET criteria, using the distal internal carotid diameter as the denominator. RADIATION DOSE REDUCTION: This exam was performed according to the departmental dose-optimization program which includes automated exposure control, adjustment of the mA and/or kV according to patient size and/or use of iterative reconstruction technique. CONTRAST:  75mL OMNIPAQUE IOHEXOL 350 MG/ML SOLN COMPARISON:  None Available. FINDINGS: CT HEAD FINDINGS Brain: No hemorrhage. No hydrocephalus. No extra-axial fluid collection. No CT evidence of an acute cortical infarct. No mass effect. No mass lesion. Vascular: No hyperdense vessel or unexpected calcification. Skull: Nonspecific lucent lesion along the left parietal calvarium, unchanged from 2016, and likely a hemangioma. Sinuses/Orbits: No middle ear  or mastoid effusion. Paranasal sinuses are clear. Orbits are unremarkable. Other: None. Review of the MIP images confirms the above findings CTA NECK FINDINGS Aortic arch: Standard branching. Imaged portion shows no evidence of aneurysm or dissection. No significant stenosis of the major arch vessel origins. Right carotid system: No evidence of dissection, stenosis (50% or greater), or occlusion. Left carotid system: No evidence of dissection, stenosis (50% or greater), or occlusion. Vertebral arteries: Codominant. No evidence of dissection, stenosis (50% or greater), or occlusion. Skeleton: Negative. Other neck: Negative. Upper chest: Mild centrilobular emphysema. Review of the MIP images confirms the above findings CTA HEAD FINDINGS Anterior circulation: No significant stenosis, proximal occlusion, aneurysm, or vascular malformation. Posterior circulation: No significant stenosis, proximal occlusion, aneurysm, or vascular malformation. Venous sinuses: As permitted by contrast timing, patent. Anatomic variants: None Review of the MIP images confirms the above findings IMPRESSION: 1. No acute intracranial abnormality. 2. No intracranial large vessel occlusion or significant stenosis. 3. No hemodynamically significant stenosis in the neck. Emphysema (ICD10-J43.9). Electronically Signed   By: Lorenza Cambridge M.D.   On: 04/11/2023 14:59    Procedures .Critical Care  Performed by: Melene Plan, DO Authorized by: Melene Plan, DO   Critical care provider statement:    Critical care time (minutes):  35   Critical care time was exclusive of:  Separately billable procedures and treating other patients   Critical care was time spent personally by me on the following activities:  Development of treatment plan with patient or surrogate, discussions with consultants, evaluation of patient's response to treatment, examination of patient, ordering and review of laboratory studies, ordering and review of radiographic studies,  ordering and performing treatments and interventions, pulse oximetry, re-evaluation of patient's condition and review of old charts   Care discussed with: admitting provider       Medications Ordered in ED Medications   stroke: early stages of recovery book (has no administration in time range)  0.9 %  sodium chloride infusion (has no administration in time range)  acetaminophen (TYLENOL) tablet 650 mg (has no administration in time range)    Or  acetaminophen (TYLENOL) 160 MG/5ML solution 650 mg (has no administration in time range)  Or  acetaminophen (TYLENOL) suppository 650 mg (has no administration in time range)  senna-docusate (Senokot-S) tablet 1 tablet (has no administration in time range)  pantoprazole (PROTONIX) injection 40 mg (has no administration in time range)  iohexol (OMNIPAQUE) 350 MG/ML injection 75 mL (75 mLs Intravenous Contrast Given 04/11/23 1428)  tenecteplase (TNKASE) injection for Stroke 22 mg (22 mg Intravenous Given 04/11/23 1502)    ED Course/ Medical Decision Making/ A&P                                 Medical Decision Making Amount and/or Complexity of Data Reviewed Labs: ordered. Radiology: ordered.  Risk Decision regarding hospitalization.   57 yo F with a chief complaints of sudden onset headache and one-sided weakness.  The patient states that she was at work and she suddenly felt unwell.  She was actually seen yesterday for not feeling well and I asked her about this and she has a lot of trouble telling me what is going on.  I am worried that she has acute aphasia, she also has some right upper extremity weakness on my exam.  I discussed the case with the provider that saw her in the MSE process.  At that time she had no obvious weakness.  I will make her a code stroke.  Seen by neurology acutely.  Given thrombolytics.  Neurology admission.  The patients results and plan were reviewed and discussed.   Any x-rays performed were independently  reviewed by myself.   Differential diagnosis were considered with the presenting HPI.  Medications   stroke: early stages of recovery book (has no administration in time range)  0.9 %  sodium chloride infusion (has no administration in time range)  acetaminophen (TYLENOL) tablet 650 mg (has no administration in time range)    Or  acetaminophen (TYLENOL) 160 MG/5ML solution 650 mg (has no administration in time range)    Or  acetaminophen (TYLENOL) suppository 650 mg (has no administration in time range)  senna-docusate (Senokot-S) tablet 1 tablet (has no administration in time range)  pantoprazole (PROTONIX) injection 40 mg (has no administration in time range)  iohexol (OMNIPAQUE) 350 MG/ML injection 75 mL (75 mLs Intravenous Contrast Given 04/11/23 1428)  tenecteplase (TNKASE) injection for Stroke 22 mg (22 mg Intravenous Given 04/11/23 1502)    Vitals:   04/11/23 1510 04/11/23 1515 04/11/23 1520 04/11/23 1525  BP: (!) 145/91 (!) 139/91 (!) 129/99 (!) 150/90  Pulse: 84 81 86 82  Resp: 15 17 19  (!) 21  Temp:      TempSrc:      SpO2: 97% 98% 99% 98%  Weight:      Height:        Final diagnoses:  Acute stroke due to ischemia [facility]    Admission/ observation were discussed with the admitting physician, patient and/or family and they are comfortable with the plan.          Final Clinical Impression(s) / ED Diagnoses Final diagnoses:  Acute stroke due to ischemia [facility])    Rx / DC Orders ED Discharge Orders     None         Melene Plan, DO 04/11/23 1550

## 2023-04-12 ENCOUNTER — Inpatient Hospital Stay (HOSPITAL_COMMUNITY): Payer: Medicaid Other

## 2023-04-12 DIAGNOSIS — R29818 Other symptoms and signs involving the nervous system: Secondary | ICD-10-CM | POA: Diagnosis not present

## 2023-04-12 DIAGNOSIS — I639 Cerebral infarction, unspecified: Secondary | ICD-10-CM | POA: Diagnosis not present

## 2023-04-12 DIAGNOSIS — I63513 Cerebral infarction due to unspecified occlusion or stenosis of bilateral middle cerebral arteries: Secondary | ICD-10-CM | POA: Diagnosis not present

## 2023-04-12 DIAGNOSIS — R4701 Aphasia: Secondary | ICD-10-CM | POA: Diagnosis not present

## 2023-04-12 DIAGNOSIS — I6389 Other cerebral infarction: Secondary | ICD-10-CM | POA: Diagnosis not present

## 2023-04-12 DIAGNOSIS — R519 Headache, unspecified: Secondary | ICD-10-CM | POA: Diagnosis not present

## 2023-04-12 LAB — MRSA NEXT GEN BY PCR, NASAL: MRSA by PCR Next Gen: NOT DETECTED

## 2023-04-12 LAB — ECHOCARDIOGRAM COMPLETE
AR max vel: 2.57 cm2
AV Area VTI: 2.32 cm2
AV Area mean vel: 2.35 cm2
AV Mean grad: 4 mm[Hg]
AV Peak grad: 7.1 mm[Hg]
Ao pk vel: 1.33 m/s
Area-P 1/2: 6.54 cm2
Height: 68 in
MV M vel: 5.33 m/s
MV Peak grad: 113.6 mm[Hg]
Radius: 0.9 cm
S' Lateral: 4.4 cm
Weight: 3136 [oz_av]

## 2023-04-12 LAB — LIPID PANEL
Cholesterol: 114 mg/dL (ref 0–200)
HDL: 33 mg/dL — ABNORMAL LOW (ref >40)
LDL Cholesterol: 69 mg/dL (ref 0–99)
Total CHOL/HDL Ratio: 3.5 {ratio}
Triglycerides: 58 mg/dL (ref <150)
VLDL: 12 mg/dL (ref 0–40)

## 2023-04-12 MED ORDER — ATORVASTATIN CALCIUM 10 MG PO TABS: 10.0000 mg | ORAL_TABLET | Freq: Every day | ORAL | Status: DC

## 2023-04-12 MED ORDER — TORSEMIDE 20 MG PO TABS
20.0000 mg | ORAL_TABLET | Freq: Every day | ORAL | Status: DC
  Administered 2023-04-12 – 2023-04-13 (×2): 20 mg via ORAL
  Filled 2023-04-12 (×2): qty 1

## 2023-04-12 MED ORDER — ASPIRIN 81 MG PO CHEW
81.0000 mg | CHEWABLE_TABLET | Freq: Every day | ORAL | Status: DC
  Administered 2023-04-13: 81 mg via ORAL
  Filled 2023-04-12: qty 1

## 2023-04-12 MED ORDER — POTASSIUM CHLORIDE CRYS ER 20 MEQ PO TBCR
40.0000 meq | EXTENDED_RELEASE_TABLET | Freq: Every day | ORAL | Status: DC
  Administered 2023-04-12 – 2023-04-13 (×2): 40 meq via ORAL
  Filled 2023-04-12 (×2): qty 2

## 2023-04-12 MED ORDER — SACUBITRIL-VALSARTAN 24-26 MG PO TABS
1.0000 | ORAL_TABLET | Freq: Every day | ORAL | Status: DC
  Administered 2023-04-12 – 2023-04-13 (×2): 1 via ORAL
  Filled 2023-04-12 (×2): qty 1

## 2023-04-12 MED ORDER — CLOPIDOGREL BISULFATE 75 MG PO TABS
75.0000 mg | ORAL_TABLET | Freq: Every day | ORAL | Status: DC
  Administered 2023-04-13: 75 mg via ORAL
  Filled 2023-04-12: qty 1

## 2023-04-12 MED ORDER — POTASSIUM CHLORIDE CRYS ER 20 MEQ PO TBCR
40.0000 meq | EXTENDED_RELEASE_TABLET | Freq: Once | ORAL | Status: AC
Start: 2023-04-12 — End: 2023-04-12
  Administered 2023-04-12: 40 meq via ORAL

## 2023-04-12 MED ORDER — CHLORHEXIDINE GLUCONATE CLOTH 2 % EX PADS
6.0000 | MEDICATED_PAD | Freq: Every day | CUTANEOUS | Status: DC
Start: 2023-04-12 — End: 2023-04-13
  Administered 2023-04-12: 6 via TOPICAL

## 2023-04-12 MED ORDER — SPIRONOLACTONE 12.5 MG HALF TABLET
12.5000 mg | ORAL_TABLET | Freq: Every day | ORAL | Status: DC
  Administered 2023-04-12 – 2023-04-13 (×2): 12.5 mg via ORAL
  Filled 2023-04-12 (×2): qty 1

## 2023-04-12 MED ORDER — PANTOPRAZOLE SODIUM 40 MG PO TBEC
40.0000 mg | DELAYED_RELEASE_TABLET | Freq: Every day | ORAL | Status: DC
  Administered 2023-04-12: 40 mg via ORAL

## 2023-04-12 MED ORDER — METOPROLOL SUCCINATE ER 50 MG PO TB24
75.0000 mg | ORAL_TABLET | Freq: Every day | ORAL | Status: DC
  Administered 2023-04-12 – 2023-04-13 (×2): 75 mg via ORAL
  Filled 2023-04-12 (×2): qty 1

## 2023-04-12 NOTE — Progress Notes (Signed)
TCD bubble study has been completed.   Results can be found under chart review under CV PROC. 04/12/2023 4:59 PM Delina Kruczek RVT, RDMS

## 2023-04-12 NOTE — Progress Notes (Signed)
      [facility] has been requested to perform a transesophageal echocardiogram on [name] for evaluation of CVA.     The patient does NOT have any absolute or relative contraindications to a Transesophageal Echocardiogram (TEE).  The patient has: History of Obstructive Sleep Apnea   After careful review of history and examination, the risks and benefits of transesophageal echocardiogram have been explained including risks of esophageal damage, perforation (1:10,000 risk), bleeding, pharyngeal hematoma as well as other potential complications associated with conscious sedation including aspiration, arrhythmia, respiratory failure and death. Alternatives to treatment were discussed, questions were answered. Patient is willing to proceed.   Signed, Jonita Albee, PA-C  04/12/2023 2:06 PM   Jonita Albee, PA-C 04/12/2023 2:06 PM

## 2023-04-12 NOTE — Evaluation (Signed)
Speech Language Pathology Evaluation Patient Details Name: Chelsea Branch MRN: [id number] DOB: 1965/09/13 Today's Date: 04/12/2023 Time: 0102-7253 SLP Time Calculation (min) (ACUTE ONLY): 22 min  Problem List:  Patient Active Problem List   Diagnosis Date Noted   Stroke determined by clinical assessment (HCC) 04/11/2023   Liletta IUD (intrauterine device) in place since 12/15/2015 04/06/2023   Bacterial vaginitis 01/13/2023   External otitis of both ears due to fungus 10/07/2022   Chronic pain syndrome 12/28/2021   Primary osteoarthritis of both knees 12/28/2021   Spinal stenosis of lumbar region 07/21/2021   Lumbar radiculitis 07/21/2021   COPD (chronic obstructive pulmonary disease) (HCC) 07/21/2021   Mechanical complication due to intrauterine contraceptive device 07/21/2021   Arthropathy of lumbar facet joint 03/14/2019   Degenerative disc disease at L5-S1 level 10/10/2018   Low TSH level 04/26/2016   Abnormal uterine bleeding 04/06/2016   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   History of non-ST elevation myocardial infarction (NSTEMI) 06/14/2015   Homelessness 09/16/2014   Vitamin D deficiency 01/29/2014   OSA (obstructive sleep apnea) 01/08/2014   Chronic combined systolic and diastolic CHF (congestive heart failure) (HCC) 08/14/2013   Essential hypertension    Fibroids 08/09/2012   Menorrhagia 05/08/2012   Female pattern hair loss    HLD (hyperlipidemia) 06/08/2010   CEREBRAL ANEURYSM 06/08/2010   History of cardiovascular disorder 06/08/2010   Past Medical History:  Past Medical History:  Diagnosis Date   Abnormal thyroid blood test 03/15/2017   Abnormal uterine bleeding    Alopecia    Anemia    Angina pectoris with normal coronary arteriogram (HCC) 2017   Had + Troponin c/w ? 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 iniatially ?2/2 peripartum in 1994 - improved by 2008 then worsening EF in 2011 back down to EF 25-30%. b. echo 01/21/14 showed mod LVH, [name]; 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   Peripartum cardiomyopathy 1994   Sleep apnea 2015   CPAP 12/2013   Stroke (HCC) 2011   Systolic and diastolic CHF, acute on chronic (HCC) 06/14/2015   Tobacco abuse    Past Surgical History:  Past Surgical History:  Procedure Laterality Date   CARDIAC CATHETERIZATION N/A 06/16/2015   Procedure: Left Heart Cath and Coronary Angiography;  Surgeon: Corky Crafts, MD;  Location: [facility] INVASIVE CV LAB;  Service: Cardiovascular;  Laterality: N/A;   CESAREAN SECTION  1992  1994   LOOP RECORDER IMPLANT  ~ 2000   TIBIAL TUBERCLERPLASTY     TUBAL LIGATION  1994   HPI:  Chelsea Branch is a 57 yo female presenting to ED 11/18 with expressive aphasia, R UE weakness, and confusion. MRI Brain with acute infarcts in the acute occipital lobe, posterior aspect of L insula, and L cerebellar hemisphere. TNK administered. PMH includes prior CVA (2011), OSA, tobacco use, chronic combined CHF, HTN, NSTEMI (2017), NIDCM   Assessment / Plan / Recommendation Clinical Impression  Pt reports living with her significant  other and working a Sports coach job at [employer]. She presents with a fluent aphasia and mild receptive deficits. Pt oriented x4. She appears to use carrier phrases to compensate for expressive deficits during attempts at self-correction during spontaneous speech. She was able to accurately recall 3/5 novel objects after a delay. Her speech is  characterized by phonemic and semantic paraphasias. Although writing simultaneously with speech does seem to increase intelligibility, she is perseverative during both verbal and written output. Repetition at the word level is intact, although pt has increased difficulty as complexity progresses to the phrase level. She was able to follow all one- and two-step basic commands, but note increased difficulty when temporal elements were added. During a confrontation naming task, pt benefitted from sentence completion cues. At times, pt has good awareness of deficits but this is not always consistent. Overall, pt may benefit from ongoing SLP f/u to address deficits listed above. Will follow acutely and recommend intensive f/u upon d/c.    SLP Assessment  SLP Recommendation/Assessment: Patient needs continued Speech Lanaguage Pathology Services SLP Visit Diagnosis: Aphasia (R47.01);Cognitive communication deficit (R41.841)    Recommendations for follow up therapy are one component of a multi-disciplinary discharge planning process, led by the attending physician.  Recommendations may be updated based on patient status, additional functional criteria and insurance authorization.    Follow Up Recommendations  Acute inpatient rehab (3hours/day)    Assistance Recommended at Discharge  Frequent or constant Supervision/Assistance  Functional Status Assessment Patient has had a recent decline in their functional status and demonstrates the ability to make significant improvements in function in a reasonable and predictable amount of time.  Frequency and Duration min 2x/week  2 weeks      SLP Evaluation Cognition  Overall Cognitive Status: Impaired/Different from baseline Arousal/Alertness: Awake/alert Orientation Level: Oriented X4 Attention: Selective Selective Attention: Impaired Selective Attention Impairment: Verbal basic Memory: Impaired Memory Impairment: Retrieval deficit Awareness:  Impaired Awareness Impairment: Emergent impairment Problem Solving: Impaired Problem Solving Impairment: Verbal basic       Comprehension  Auditory Comprehension Overall Auditory Comprehension: Impaired Yes/No Questions: Not tested Commands: Impaired One Step Basic Commands: 75-100% accurate Two Step Basic Commands: 75-100% accurate Multistep Basic Commands: 0-24% accurate    Expression Expression Primary Mode of Expression: Verbal Verbal Expression Overall Verbal Expression: Impaired Initiation: No impairment Automatic Speech: Name;Day of week;Social Response Level of Generative/Spontaneous Verbalization: Sentence Repetition: Impaired Level of Impairment: Phrase level Naming: Impairment Confrontation: Impaired Divergent: 25-49% accurate   Oral / Motor  Oral Motor/Sensory Function Overall Oral Motor/Sensory Function: Within functional limits Motor Speech Overall Motor Speech: Impaired Respiration: Within functional limits Phonation: Normal Resonance: Within functional limits Articulation: Impaired Level of Impairment: Phrase Intelligibility: Intelligibility reduced Phrase: 50-74% accurate            Gwynneth Aliment, M.A., CF-SLP Speech Language Pathology, Acute Rehabilitation Services  Secure Chat preferred [phone]  04/12/2023, 4:58 PM

## 2023-04-12 NOTE — Plan of Care (Signed)

## 2023-04-12 NOTE — Evaluation (Signed)
Occupational Therapy Evaluation Patient Details Name: Chelsea Branch MRN: [id number] DOB: [birth year] Today's Date: 04/12/2023   History of Present Illness 57 yo female expressive aphasia, R UE weakness confusion NIH 7 TNK given 1502PM 11/18  MRI (+) Acute infarcts R occipital lobe, along the posterior aspect of the left insula, and in the L cerebellar hemispher,infarction in the L  parietal lobe. Findings are suspicious for cardioembolic etiology.Trace petechial hemorrhage in the medial aspect of the right  occipital lobe and posterior left insula.  PMH DDD CVA 2012 OSA smoker CHF HTN Nstemi 2017 NIDCM Chronic LBP   Clinical Impression   PT admitted with CVA with visual deficits. Pt currently with functional limitiations due to the deficits listed below (see OT problem list). Pt at baseline lives with significant other, works a Sports coach job from [employer]. Pt lacks awareness to visual deficits and internally focused on speech deficits. Pt given piece of paper demonstrates increased accuracy writing her responses while saying them out loud at the same time.  Pt will benefit from skilled OT to increase their independence and safety with adls and balance to allow discharge Patient will benefit from intensive inpatient follow up therapy, >3 hours/day .        If plan is discharge home, recommend the following: A little help with walking and/or transfers;A little help with bathing/dressing/bathroom;Assist for transportation    Functional Status Assessment  Patient has had a recent decline in their functional status and demonstrates the ability to make significant improvements in function in a reasonable and predictable amount of time.  Equipment Recommendations  None recommended by OT    Recommendations for Other Services Rehab consult     Precautions / Restrictions Precautions Precautions: Fall Restrictions Weight Bearing Restrictions: No      Mobility Bed Mobility                General bed mobility comments: oob on arrival. pt reports starting work 4:30AM    Transfers Overall transfer level: Needs assistance   Transfers: Sit to/from Stand Sit to Stand: Contact guard assist                  Balance Overall balance assessment: Mild deficits observed, not formally tested                                         ADL either performed or assessed with clinical judgement   ADL Overall ADL's : Needs assistance/impaired                         Toilet Transfer: Ambulance person;Ambulation   Toileting- Clothing Manipulation and Hygiene: Contact guard assist;Sit to/from stand       Functional mobility during ADLs: Contact guard assist       Vision Baseline Vision/History: 0 No visual deficits Ability to See in Adequate Light: 1 Impaired Patient Visual Report: Blurring of vision Vision Assessment?: Vision impaired- to be further tested in functional context Eye Alignment: Within Functional Limits Ocular Range of Motion: Within Functional Limits Alignment/Gaze Preference: Head turned;Head tilt Visual Fields: Impaired-to be further tested in functional context Additional Comments: pt noted to have visual deficits with head tilt and R tilt. Pt missing letters in words and not aware of the deficits when reading out loud. pt using central vision. pt with peripheral L side visual deficits noted  with testing     Perception         Praxis         Pertinent Vitals/Pain Pain Assessment Pain Assessment: No/denies pain     Extremity/Trunk Assessment Upper Extremity Assessment Upper Extremity Assessment: Overall WFL for tasks assessed   Lower Extremity Assessment Lower Extremity Assessment: Defer to PT evaluation   Cervical / Trunk Assessment Cervical / Trunk Assessment: Normal   Communication Communication Communication: Difficulty communicating thoughts/reduced clarity of speech Cueing  Techniques: Gestural cues;Verbal cues   Cognition Arousal: Alert Behavior During Therapy: Flat affect Overall Cognitive Status: Impaired/Different from baseline Area of Impairment: Safety/judgement, Awareness                         Safety/Judgement: Decreased awareness of deficits, Decreased awareness of safety Awareness: Emergent         General Comments  VSS    Exercises     Shoulder Instructions      Home Living Family/patient expects to be discharged to:: Private residence Living Arrangements: Spouse/significant other Available Help at Discharge: Family;Available PRN/intermittently Type of Home: Apartment Home Access: Level entry     Home Layout: One level     Bathroom Shower/Tub: Chief Strategy Officer: Standard     Home Equipment: None   Additional Comments: stays with the father to her son Chelsea Branch and also with her mother. information in chart is the housing with significant other described in sesssion      Prior Functioning/Environment Prior Level of Function : Independent/Modified Independent;Working/employed             Mobility Comments: works a Sports coach for [employer] List: Decreased activity tolerance;Impaired balance (sitting and/or standing);Impaired vision/perception;Decreased cognition;Decreased safety awareness;Decreased knowledge of precautions;Decreased knowledge of use of DME or AE;Obesity      OT Treatment/Interventions: Self-care/ADL training;Therapeutic exercise;DME and/or AE instruction;Therapeutic activities;Cognitive remediation/compensation;Visual/perceptual remediation/compensation;Patient/family education;Balance training    OT Goals(Current goals can be found in the care plan section) Acute Rehab OT Goals Patient Stated Goal: to return home OT Goal Formulation: With patient Time For Goal Achievement: 04/26/23 Potential to Achieve Goals: Good  OT Frequency: Min 1X/week     Co-evaluation              AM-PAC OT "6 Clicks" Daily Activity     Outcome Measure Help from another person eating meals?: A Little Help from another person taking care of personal grooming?: A Little Help from another person toileting, which includes using toliet, bedpan, or urinal?: A Little Help from another person bathing (including washing, rinsing, drying)?: A Little Help from another person to put on and taking off regular upper body clothing?: A Little Help from another person to put on and taking off regular lower body clothing?: A Little 6 Click Score: 18   End of Session Equipment Utilized During Treatment: Gait belt Nurse Communication: Mobility status;Precautions  Activity Tolerance: Patient tolerated treatment well Patient left: in chair;with call bell/phone within reach;with chair alarm set  OT Visit Diagnosis: Unsteadiness on feet (R26.81);Muscle weakness (generalized) (M62.81)                Time: 1610-9604 OT Time Calculation (min): 26 min Charges:  OT General Charges $OT Visit: 1 Visit OT Evaluation $OT Eval Moderate Complexity: 1 Mod   Brynn, OTR/L  Acute Rehabilitation Services Office: [phone] .   Mateo Flow 04/12/2023, 3:11 PM

## 2023-04-12 NOTE — Progress Notes (Signed)
   04/12/23 1600  Spiritual Encounters  Type of Visit Follow up  Care provided to: Patient  Reason for visit Routine spiritual support  OnCall Visit No   Follow-up visit performed. Conversed with patient about medical goals, children and plans once she leaves the hospital. Patient's sister,Lisa is a patient in [facility].  Patient wants nursing staff to take her to her sister's room to visit and learn the statis of her sister. Visit interrupted again, will revisit patient on tomorrow.

## 2023-04-12 NOTE — Progress Notes (Signed)
OT Cancellation Note  Patient Details Name: Chelsea Branch MRN: [id number] DOB: [birth year]   Cancelled Treatment:    Reason Eval/Treat Not Completed: Active bedrest order OT order received and appreciated however this conflicts with current bedrest order set. Please increase activity tolerance as appropriate and remove bedrest from orders. . Please contact OT at [phone] if bed rest order is discontinued. OT will hold evaluation at this time and will check back as time allows pending increased activity orders.   Mateo Flow 04/12/2023, 7:20 AM

## 2023-04-12 NOTE — Progress Notes (Signed)
*  PRELIMINARY RESULTS* Echocardiogram 2D Echocardiogram has been performed.  Laddie Aquas 04/12/2023, 1:51 PM

## 2023-04-12 NOTE — Progress Notes (Signed)
   04/12/23 1515  Spiritual Encounters  Type of Visit Initial  Care provided to: Patient  Conversation partners present during encounter Other (comment)  Referral source Family  Reason for visit Routine spiritual support  OnCall Visit No   Visited patient on request from family. Patient has a sister in [place], Misty Stanley and another sister Maxine Glenn in [place]. Patient was no longer available as she needed tech. Advised patient chaplain will attempt again.

## 2023-04-12 NOTE — Evaluation (Signed)
Physical Therapy Evaluation Patient Details Name: Chelsea Branch MRN: [id number] DOB: 1965-10-30 Today's Date: 04/12/2023  History of Present Illness  57 yo female expressive aphasia, R UE weakness confusion NIH 7 TNK given 1502PM 11/18  MRI (+) Acute infarcts R occipital lobe, along the posterior aspect of the left insula, and in the L cerebellar hemispher,infarction in the L  parietal lobe. Findings are suspicious for cardioembolic etiology.Trace petechial hemorrhage in the medial aspect of the right  occipital lobe and posterior left insula.  PMH DDD CVA 2012 OSA smoker CHF HTN Nstemi 2017 NIDCM Chronic LBP  Clinical Impression  Patient presents with decreased mobility due to generalized weakness, decreased vision, decreased balance and decreased awareness of deficits.  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  She currently needs min A for ambulation in the hallway with cues and increased time for turning.  She was able to read 3 signs of varying font size in the hallway, though struggled due to speech deficits.  She will benefit from skilled PT in the acute setting.  Feel she is currently appropriate for inpatient rehab (>3 hours/day) prior to d/c home.         If plan is discharge home, recommend the following: A little help with walking and/or transfers;Assistance with cooking/housework;Assist for transportation;A little help with bathing/dressing/bathroom;Help with stairs or ramp for entrance   Can travel by private vehicle        Equipment Recommendations Other (comment) (TBA)  Recommendations for Other Services  Rehab consult    Functional Status Assessment Patient has had a recent decline in their functional status and demonstrates the ability to make significant improvements in function in a reasonable and predictable amount of time.     Precautions / Restrictions Precautions Precautions: Fall Precaution Comments: decreased peripheral  vision (possible L more than R)      Mobility  Bed Mobility               General bed mobility comments: oob on arrival. pt reports starting work 4:30AM    Transfers Overall transfer level: Needs assistance Equipment used: None Transfers: Sit to/from Stand Sit to Stand: Contact guard assist           General transfer comment: assist for lines and balance    Ambulation/Gait Ambulation/Gait assistance: Min assist Gait Distance (Feet): 400 Feet Assistive device: None Gait Pattern/deviations: Step-to pattern, Step-through pattern, Decreased stride length       General Gait Details: noted difficulty with turning, increased time, increased cues, slower pace; several stops to read signs in hallway for vision assessment  Stairs            Wheelchair Mobility     Tilt Bed    Modified Rankin (Stroke Patients Only) Modified Rankin (Stroke Patients Only) Pre-Morbid Rankin Score: No symptoms Modified Rankin: Moderately severe disability     Balance Overall balance assessment: Mild deficits observed, not formally tested                                           Pertinent Vitals/Pain Pain Assessment Pain Assessment: No/denies pain    Home Living Family/patient expects to be discharged to:: Private residence Living Arrangements: Spouse/significant other Available Help at Discharge: Family;Available PRN/intermittently Type of Home: Apartment Home Access: Level entry       Home Layout: One level Home Equipment:  None Additional Comments: stays with the father to her son Chelsea Branch and also with her mother. information in chart is the housing with significant other described in sesssion    Prior Function Prior Level of Function : Independent/Modified Independent;Working/employed             Mobility Comments: works a Sports coach for [employer] Extremity  Assessment: Defer to OT evaluation    Lower Extremity Assessment Lower Extremity Assessment: Generalized weakness    Cervical / Trunk Assessment Cervical / Trunk Assessment: Normal  Communication   Communication Communication: Difficulty communicating thoughts/reduced clarity of speech (word finding difficulty and apraxia) Cueing Techniques: Verbal cues;Gestural cues  Cognition Arousal: Alert Behavior During Therapy: Flat affect Overall Cognitive Status: Impaired/Different from baseline Area of Impairment: Safety/judgement, Awareness                         Safety/Judgement: Decreased awareness of deficits, Decreased awareness of safety Awareness: Emergent            General Comments General comments (skin integrity, edema, etc.): Toileted in bathroom and washed hands at sink with CGA to S,  Needed mod cues to complete handwashing possibly due to limited peripheral vision.  VSS during mobility    Exercises     Assessment/Plan    PT Assessment Patient needs continued PT services  PT Problem List Decreased balance;Decreased cognition;Decreased knowledge of precautions;Decreased mobility;Decreased safety awareness;Decreased activity tolerance       PT Treatment Interventions DME instruction;Therapeutic activities;Cognitive remediation;Therapeutic exercise;Gait training;Stair training;Balance training;Functional mobility training;Patient/family education    PT Goals (Current goals can be found in the Care Plan section)  Acute Rehab PT Goals Patient Stated Goal: return to independent; help with speech PT Goal Formulation: With patient Time For Goal Achievement: 04/26/23 Potential to Achieve Goals: Good    Frequency Min 1X/week     Co-evaluation PT/OT/SLP Co-Evaluation/Treatment: Yes Reason for Co-Treatment: For patient/therapist safety;To address functional/ADL transfers PT goals addressed during session: Mobility/safety with mobility;Balance          AM-PAC PT "6 Clicks" Mobility  Outcome Measure Help needed turning from your back to your side while in a flat bed without using bedrails?: A Little Help needed moving from lying on your back to sitting on the side of a flat bed without using bedrails?: A Little Help needed moving to and from a bed to a chair (including a wheelchair)?: A Little Help needed standing up from a chair using your arms (e.g., wheelchair or bedside chair)?: A Little Help needed to walk in hospital room?: A Little Help needed climbing 3-5 steps with a railing? : Total 6 Click Score: 16    End of Session Equipment Utilized During Treatment: Gait belt Activity Tolerance: Patient tolerated treatment well Patient left: in chair;with call bell/phone within reach;with chair alarm set   PT Visit Diagnosis: Other abnormalities of gait and mobility (R26.89);Other symptoms and signs involving the nervous system (R29.898)    Time: 3244-0102 PT Time Calculation (min) (ACUTE ONLY): 35 min   Charges:   PT Evaluation $PT Eval Moderate Complexity: 1 Mod   PT General Charges $$ ACUTE PT VISIT: 1 Visit        Sheran Lawless, PT Acute Rehabilitation Services Office:[phone] 04/12/2023   Elray Mcgregor 04/12/2023, 5:37 PM

## 2023-04-12 NOTE — Progress Notes (Addendum)
STROKE TEAM PROGRESS NOTE   BRIEF HPI Chelsea Branch is a 57 y.o. female with PMH significant for Stroke 2011, OSA, Smoker, Chronic combined CHF, HTN, NSTEMI 2017, NIDCM who presented to the ED d/t change in mental status/"feeling off", blurred vision, headache, dry mouth while at work. Patient was able to describe all of this while in triage with no aphasia or dysarthria present and vision was improving, per notes. Patient was taken for CT/CTA, which was negative.  At approximately 1427, patient went from talking and interacting normally with nursing staff to having expressive aphasia and a CODE STROKE was activated.  On exam at bedside, patient with RUE weakness, confusion and receptive/expressive aphasia. NIH: 7. Management with thrombolytic therapy was explained to the patient, and patient's son Stann Ore over the phone, as were risks, benefits and alternatives. All questions were answered. Patient, or patient's representative, expressed understanding of the treatment plan and agreed to proceed with thrombolytic treatment.TNK given at 1502.    LKW: 1427 04/11/23 TNK given? Yes, 1502 04/11/23  Mechanical thrombectomy? No, no LVO  Risks, benefits, alternatives of IV thrombolysis were discussed and family/patient agreed to proceed. CT imaging was reviewed personally prior to IV thrombolysis administration with no evidence of bleed.  Premorbid modified Rankin scale (mRS): 0  NIH on Admission 7  SIGNIFICANT HOSPITAL EVENTS  LKW: 1427 04/11/23 TNK given? Yes, 1502 04/11/23 11/19 MRI acute infarcts in the right occipital lobe, posterior aspect of left insulae and left cerebellar hemisphere, possible tiny acute infarction in the left parietal lobe, trace petechial hemorrhage in the medial aspect of the right occipital lobe and posterior left insula TCD Bubble negative    INTERIM HISTORY/SUBJECTIVE Patient reports she was at work and co-workers noticed she was more confused. Had right sided  headache and blurry vision. Also noticed some weakness yesterday in the hospital. Patient denies residual effects from prior stroke in 2021. She denies being on ASA the past 1-2 years, after being instructed to discontinue. Continues to have some confusion and difficulty finding words. Son bedside for support this afternoon.   TCD bubble study was negative for PFO. Discussed starting DAPT with plavix and ASA. Patient and son amenable with DAPT. Discussed providing more information and consideration for trial medication, and patient and son declined participation.    OBJECTIVE  CBC    Component Value Date/Time   WBC 5.3 04/11/2023 1351   RBC 5.13 (H) 04/11/2023 1351   HGB 16.0 (H) 04/11/2023 1359   HGB 14.8 01/12/2023 1042   HCT 47.0 (H) 04/11/2023 1359   HCT 43.7 01/12/2023 1042   PLT 206 04/11/2023 1351   PLT 215 01/12/2023 1042   MCV 86.9 04/11/2023 1351   MCV 87 01/12/2023 1042   MCH 28.7 04/11/2023 1351   MCHC 33.0 04/11/2023 1351   RDW 12.4 04/11/2023 1351   RDW 13.8 01/12/2023 1042   LYMPHSABS 1.9 04/11/2023 1351   LYMPHSABS 1.9 01/12/2023 1042   MONOABS 0.4 04/11/2023 1351   EOSABS 0.0 04/11/2023 1351   EOSABS 0.1 01/12/2023 1042   BASOSABS 0.0 04/11/2023 1351   BASOSABS 0.0 01/12/2023 1042    BMET    Component Value Date/Time   NA 139 04/11/2023 1359   NA 141 01/12/2023 1042   K 3.0 (L) 04/11/2023 1359   CL 98 04/11/2023 1359   CO2 26 04/11/2023 1351   GLUCOSE 106 (H) 04/11/2023 1359   BUN 12 04/11/2023 1359   BUN 16 01/12/2023 1042   CREATININE 1.10 (H) 04/11/2023 1359  CREATININE 0.82 04/06/2016 1441   CALCIUM 8.9 04/11/2023 1351   EGFR 77 01/12/2023 1042   GFRNONAA 49 (L) 04/11/2023 1351   GFRNONAA 83 04/06/2016 1441    IMAGING past 24 hours ECHOCARDIOGRAM COMPLETE  Result Date: 04/12/2023    ECHOCARDIOGRAM REPORT   Patient Name:   Chelsea Branch Date of Exam: 04/12/2023 Medical Rec #:  [id number]    Height:       68.0 in Accession #:    [id number]    Weight:       196.0 lb Date of Birth:  20-Nov-1965    BSA:          2.026 m Patient Age:    57 years     BP:           126/106 mmHg Patient Gender: F            HR:           84 bpm. Exam Location:  Inpatient Procedure: 2D Echo, Cardiac Doppler, 3D Echo and Color Doppler Indications:    Stroke I63.9  History:        Patient has prior history of Echocardiogram examinations, most                 recent 08/25/2021. CHF, CAD and Previous Myocardial Infarction,                 Stroke and COPD; Risk Factors:Hypertension, Dyslipidemia, Former                 Smoker and Sleep Apnea.  Sonographer:    Dondra Prader RVT RCS Referring Phys: [id number] [facility] IMPRESSIONS  1. Left ventricular ejection fraction, by estimation, is 30 to 35%. Left ventricular ejection fraction by 3D volume is 33 %. The left ventricle has moderately decreased function. The left ventricle demonstrates global hypokinesis. The left ventricular internal cavity size was severely dilated. There is mild concentric left ventricular hypertrophy. Left ventricular diastolic parameters are consistent with Grade III diastolic dysfunction (restrictive). Elevated left ventricular end-diastolic pressure.  2. Right ventricular systolic function is normal. The right ventricular size is normal. Tricuspid regurgitation signal is inadequate for assessing PA pressure.  3. Left atrial size was severely dilated.  4. The mitral valve is normal in structure. Moderate mitral valve regurgitation. No evidence of mitral stenosis.  5. The aortic valve is tricuspid. Aortic valve regurgitation is not visualized. Aortic valve sclerosis/calcification is present, without any evidence of aortic stenosis. Aortic valve area, by VTI measures 2.32 cm. Aortic valve mean gradient measures 4.0 mmHg. Aortic valve Vmax measures 1.33 m/s.  6. Aortic dilatation noted. There is mild dilatation of the ascending aorta, measuring 38 mm.  7. The inferior vena cava is normal in size with greater  than 50% respiratory variability, suggesting right atrial pressure of 3 mmHg.  8. Compared to prior echo, LVF has declined. Comparison(s): EF 60-65%. FINDINGS  Left Ventricle: Left ventricular ejection fraction, by estimation, is 30 to 35%. Left ventricular ejection fraction by 3D volume is 33 %. The left ventricle has moderately decreased function. The left ventricle demonstrates global hypokinesis. The left ventricular internal cavity size was severely dilated. There is mild concentric left ventricular hypertrophy. Left ventricular diastolic parameters are consistent with Grade III diastolic dysfunction (restrictive). Elevated left ventricular end-diastolic  pressure. Right Ventricle: The right ventricular size is normal. No increase in right ventricular wall thickness. Right ventricular systolic function is normal. Tricuspid regurgitation signal is inadequate for assessing PA pressure. Left  Atrium: Left atrial size was severely dilated. Right Atrium: Right atrial size was normal in size. Pericardium: There is no evidence of pericardial effusion. Mitral Valve: The mitral valve is normal in structure. Moderate mitral valve regurgitation. No evidence of mitral valve stenosis. Tricuspid Valve: The tricuspid valve is normal in structure. Tricuspid valve regurgitation is trivial. No evidence of tricuspid stenosis. Aortic Valve: The aortic valve is tricuspid. Aortic valve regurgitation is not visualized. Aortic valve sclerosis/calcification is present, without any evidence of aortic stenosis. Aortic valve mean gradient measures 4.0 mmHg. Aortic valve peak gradient measures 7.1 mmHg. Aortic valve area, by VTI measures 2.32 cm. Pulmonic Valve: The pulmonic valve was normal in structure. Pulmonic valve regurgitation is mild. No evidence of pulmonic stenosis. Aorta: Aortic dilatation noted. There is mild dilatation of the ascending aorta, measuring 38 mm. Venous: The inferior vena cava is normal in size with greater than  50% respiratory variability, suggesting right atrial pressure of 3 mmHg. IAS/Shunts: No atrial level shunt detected by color flow Doppler.  LEFT VENTRICLE PLAX 2D LVIDd:         6.20 cm         Diastology LVIDs:         4.40 cm         LV e' medial:    3.09 cm/s LV PW:         1.20 cm         LV E/e' medial:  41.7 LV IVS:        1.20 cm         LV e' lateral:   7.97 cm/s LVOT diam:     2.10 cm         LV E/e' lateral: 16.2 LV SV:         54 LV SV Index:   27 LVOT Area:     3.46 cm        3D Volume EF                                LV 3D EF:    Left                                             ventricul                                             ar                                             ejection                                             fraction                                             by 3D  volume is                                             33 %.                                 3D Volume EF:                                3D EF:        33 %                                LV EDV:       221 ml                                LV ESV:       148 ml                                LV SV:        72 ml RIGHT VENTRICLE             IVC RV S prime:     13.10 cm/s  IVC diam: 1.90 cm TAPSE (M-mode): 2.1 cm LEFT ATRIUM             Index        RIGHT ATRIUM           Index LA diam:        4.60 cm 2.27 cm/m   RA Area:     11.30 cm LA Vol (A2C):   95.0 ml 46.88 ml/m  RA Volume:   29.10 ml  14.36 ml/m LA Vol (A4C):   97.3 ml 48.02 ml/m LA Biplane Vol: 96.2 ml 47.48 ml/m  AORTIC VALVE                    PULMONIC VALVE AV Area (Vmax):    2.57 cm     PV Vmax:       0.81 m/s AV Area (Vmean):   2.35 cm     PV Peak grad:  2.6 mmHg AV Area (VTI):     2.32 cm AV Vmax:           133.00 cm/s AV Vmean:          89.800 cm/s AV VTI:            0.234 m AV Peak Grad:      7.1 mmHg AV Mean Grad:      4.0 mmHg LVOT Vmax:         98.50 cm/s LVOT Vmean:        60.900 cm/s LVOT VTI:           0.157 m LVOT/AV VTI ratio: 0.67  AORTA Ao Root diam: 3.10 cm Ao Asc diam:  3.80 cm MITRAL VALVE MV Area (PHT): 6.54 cm       SHUNTS MV Decel Time: 116 msec       Systemic VTI:  0.16 m MR Peak grad:    113.6  mmHg   Systemic Diam: 2.10 cm MR Mean grad:    74.0 mmHg MR Vmax:         533.00 cm/s MR Vmean:        410.0 cm/s MR PISA:         5.09 cm MR PISA Eff ROA: 34 mm MR PISA Radius:  0.90 cm MV E velocity: 129.00 cm/s MV A velocity: 61.60 cm/s MV E/A ratio:  2.09 Armanda Magic MD Electronically signed by Armanda Magic MD Signature Date/Time: 04/12/2023/2:37:35 PM    Final    MR BRAIN WO CONTRAST  Result Date: 04/12/2023 CLINICAL DATA:  Neuro deficit, acute, stroke suspected. Altered mental status, blurred vision and headache. EXAM: MRI HEAD WITHOUT CONTRAST TECHNIQUE: Multiplanar, multiecho pulse sequences of the brain and surrounding structures were obtained without intravenous contrast. COMPARISON:  MRI brain 05/18/2010. Head CT and CTA head/neck 04/11/2023. FINDINGS: Brain: Acute infarcts in the right occipital lobe, along the posterior aspect of the left insula, and in the left cerebellar hemisphere with additional tiny foci of possible acute infarction in the left parietal lobe. Findings are suspicious for cardioembolic etiology. Trace petechial hemorrhage in the medial aspect of the right occipital lobe and posterior left insula. No significant mass effect. Background of mild chronic small-vessel disease with old cortical infarct along the left middle frontal gyrus. No hydrocephalus or extra-axial collection. No mass or midline shift. Vascular: Normal flow voids. Skull and upper cervical spine: Normal marrow signal. Sinuses/Orbits: No acute findings. Other: None. IMPRESSION: Acute infarcts in the right occipital lobe, along the posterior aspect of the left insula, and in the left cerebellar hemisphere with additional tiny foci of possible acute infarction in the left parietal lobe. Findings are  suspicious for cardioembolic etiology. Trace petechial hemorrhage in the medial aspect of the right occipital lobe and posterior left insula. No significant mass effect. Electronically Signed   By: Orvan Falconer M.D.   On: 04/12/2023 12:38   CT HEAD WO CONTRAST ( )  Result Date: 04/12/2023 CLINICAL DATA:  Stroke, follow-up EXAM: CT HEAD WITHOUT CONTRAST TECHNIQUE: Contiguous axial images were obtained from the base of the skull through the vertex without intravenous contrast. RADIATION DOSE REDUCTION: This exam was performed according to the departmental dose-optimization program which includes automated exposure control, adjustment of the mA and/or kV according to patient size and/or use of iterative reconstruction technique. COMPARISON:  04/11/2023 CTA head and neck FINDINGS: Brain: Hypodensity and loss of gray-white differentiation in the right occipital lobe (series 3, image 9 and series 6, image 24), which was not evident prior exam. No acute hemorrhage, mass, mass effect, midline shift. No hydrocephalus or extra-axial collection. Vascular: No hyperdense vessel. Skull: Negative for fracture or focal lesion. Sinuses/Orbits: No acute finding. Other: The mastoid air cells are well aerated. IMPRESSION: Hypodensity and loss of gray-white differentiation in the right occipital lobe, which was not evident prior exam, concerning for an acute infarct. An MRI is recommended. No evidence of hemorrhage. These results will be called to the ordering clinician or representative by the Radiologist Assistant, and communication documented in the PACS or [employer]. Electronically Signed   By: Wiliam Ke M.D.   On: 04/12/2023 01:06    Vitals:   04/12/23 0700 04/12/23 0800 04/12/23 1200 04/12/23 1400  BP: 132/89 (!) 139/92 131/89 122/84  Pulse: 70 74 83   Resp: 17 19 (!) 27   Temp:  97.6 F (36.4 C) 98.3 F (36.8 C)   TempSrc:  Oral Oral  SpO2: 94% 95% 95%   Weight:      Height:         PHYSICAL  EXAM General:  Alert, well-nourished, well-developed patient in no acute distress Psych:  Mood and affect appropriate for situation CV: Regular rate and rhythm on monitor Respiratory:  Regular, unlabored respirations on room air GI: Abdomen soft and nontender   NEURO:  Mental Status: AA&O to place, person, situation.  Patient is unable to give clear and coherent history Speech/Language: speech is without dysarthria, mild-moderate aphasia.  Cranial Nerves:  II: PERRL. Visual fields ?  Partial right homonymous hemianopsia .  III, IV, VI: EOMI. Eyelids elevate symmetrically.  V: Sensation is intact to light touch and symmetrical to face.  VII: Face is symmetrical resting and smiling VIII: hearing intact to voice. IX, X: Palate elevates symmetrically. Phonation is normal.  XB:[id number] shrug 5/5. XII: tongue is midline without fasciculations. Motor: 4/5 strength on RUE  Tone: is normal and bulk is normal Sensation- Intact to light touch bilaterally. Extinction absent to light touch to DSS.   Coordination: FTN intact bilaterally, HKS: no ataxia in BLE.No drift.  Gait- deferred  Most Recent NIH 3    ASSESSMENT/PLAN  Acute infarcts in the right occipital lobe, posterior aspect of left insula and left cerebellar hemisphere, possible tiny acute infarction in the left parietal lobe,trace petechial hemorrhage in the medial aspect of the right s/p TNK at 1502 11/18  Etiology:  Suspect cardio embolic versus cryptogenic CTA head & neck No LVO or significant stenosis   Code Stroke CT head hypodensity and loss of gray-white differentiation in the right occipital lobe, which was not evident prior exam, concerning for acute infarct. Repeat CT post TNK  MRI acute infarcts in the right occipital lobe, posterior aspect of left insulae and left cerebellar hemisphere, possible tiny acute infarction in the left parietal lobe,trace petechial hemorrhage in the medial aspect of the right occipital lobe and  posterior left insula 2D Echo EF 30-35%, moderately decreased LVH function, w/ mild concentric LVH, Grade III Diastolic dysfunction, LA severely dilated  TEE tomorrow  LDL 69 HgbA1c 6.3 UDS negative  VTE prophylaxis - Holding for now  No antithrombotic prior to admission, will start on  aspirin 81 mg daily and clopidogrel 75 mg daily tomorrow for 3 weeks and then ASA alone Patient not amenable to participate in research study  Therapy recommendations:  CIR  Disposition:  Pending   Hx of Stroke/TIA Prior history of stroke in 2011  Discontinued daily ASA 1-2 years ago  Continue GDMT Start on  aspirin 81 mg daily and clopidogrel 75 mg daily tomorrow for 3 weeks and then ASA alone  Hypertension Home meds:  Spironolactone 12.5 mg daily, entresto 24-26 mg daily, metoprolol 75 mg daily Resumed home medications  Blood Pressure Goal: BP less than 220/110   Hyperlipidemia Home meds: Lipitor 10 mg daily resumed  LDL 69, goal < 70 Continue statin at discharge  Pre-Diabetes Controlled  Home meds:  None  HgbA1c 6.3, goal < 7.0 Recommend close follow-up with PCP for better DM control  Former Physicist, medical  Previously smoked cigarettes, unknown PPD history, quite in 2017   Other Stroke Risk Factors Obesity, Body mass index is 29.8 kg/m., BMI >/= 30 associated with increased stroke risk, recommend weight loss, diet and exercise as appropriate  Family hx stroke (sister) 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  Congestive heart failure resume Spironolactone 12.5 mg daily, entresto 24-26 mg daily, metoprolol 75 mg  daily, torsamide 20 mg daily  Obstructive sleep apnea, patient previously trialed on CPAP 2015, unable to tolerate at that time    Other Active Problems Hypokalemia: 3.1, potassium repleted prn   Hospital day # 1  I have personally obtained history,examined this patient, reviewed notes, independently viewed imaging studies, participated in medical decision making and plan  of care.ROS completed by me personally and pertinent positives fully documented  I have made any additions or clarifications directly to the above note. Agree with note above.  Patient presented with sudden onset of confusion, blurred vision and headache and received IV TNK and MRI shows embolic infarcts involving left parietal lobe, left cerebellum and left insula.  She still has some mild residual expressive aphasia.  Recommend continue close neurological monitoring and strict blood pressure control as per post TNK protocol.  Mobilize out of bed.  Therapy consults.  Start aspirin after 24 hours post TNK.  Check ANA panel, anticardiolipin antibodies, continue cardiac monitoring, TEE, and need prolonged cardiac monitoring at discharge for paroxysmal A-fib.  Long discussion with patient and son at the bedside and answered questions.This patient is critically ill and at significant risk of neurological worsening, death and care requires constant monitoring of vital signs, hemodynamics,respiratory and cardiac monitoring, extensive review of multiple databases, frequent neurological assessment, discussion with family, other specialists and medical decision making of high complexity.I have made any additions or clarifications directly to the above note.This critical care time does not reflect procedure time, or teaching time or supervisory time of PA/NP/Med Resident etc but could involve care discussion time.  I spent 30 minutes of neurocritical care time  in the care of  this patient.      Delia Heady, MD Medical Director [facility] Pager: [phone] 04/12/2023 4:54 PM   To contact Stroke Continuity provider, please refer to [email or web address]. After hours, contact General Neurology

## 2023-04-13 ENCOUNTER — Other Ambulatory Visit (HOSPITAL_COMMUNITY): Payer: Medicaid Other

## 2023-04-13 ENCOUNTER — Encounter (HOSPITAL_COMMUNITY): Payer: Self-pay

## 2023-04-13 ENCOUNTER — Ambulatory Visit: Payer: Medicaid Other | Admitting: Internal Medicine

## 2023-04-13 ENCOUNTER — Other Ambulatory Visit (HOSPITAL_COMMUNITY): Payer: Self-pay

## 2023-04-13 DIAGNOSIS — Z8673 Personal history of transient ischemic attack (TIA), and cerebral infarction without residual deficits: Secondary | ICD-10-CM | POA: Insufficient documentation

## 2023-04-13 DIAGNOSIS — I639 Cerebral infarction, unspecified: Secondary | ICD-10-CM | POA: Diagnosis not present

## 2023-04-13 MED ORDER — ASPIRIN 81 MG PO CHEW
81.0000 mg | CHEWABLE_TABLET | Freq: Every day | ORAL | 1 refills | Status: AC
  Filled 2023-04-13: qty 30, 30d supply, fill #0
  Filled 2023-05-16: qty 30, 30d supply, fill #1

## 2023-04-13 MED ORDER — CLOPIDOGREL BISULFATE 75 MG PO TABS
75.0000 mg | ORAL_TABLET | Freq: Every day | ORAL | 0 refills | Status: AC
  Filled 2023-04-13: qty 20, 20d supply, fill #0

## 2023-04-13 MED ORDER — ATORVASTATIN CALCIUM 40 MG PO TABS
40.0000 mg | ORAL_TABLET | Freq: Every day | ORAL | Status: DC
  Administered 2023-04-13: 40 mg via ORAL
  Filled 2023-04-13: qty 1

## 2023-04-13 MED ORDER — ATORVASTATIN CALCIUM 40 MG PO TABS
40.0000 mg | ORAL_TABLET | Freq: Every day | ORAL | 1 refills | Status: DC
  Filled 2023-04-13: qty 30, 30d supply, fill #0

## 2023-04-13 NOTE — Progress Notes (Signed)
Occupational Therapy Treatment Patient Details Name: Chelsea Branch MRN: [id number] DOB: 1965-06-26 Today's Date: 04/13/2023   History of present illness 57 yo female expressive aphasia, R UE weakness confusion NIH 7 TNK given 1502PM 11/18  MRI (+) Acute infarcts R occipital lobe, along the posterior aspect of the left insula, and in the L cerebellar hemispher,infarction in the L  parietal lobe. Findings are suspicious for cardioembolic etiology.Trace petechial hemorrhage in the medial aspect of the right  occipital lobe and posterior left insula.  PMH DDD CVA 2012 OSA smoker CHF HTN Nstemi 2017 NIDCM Chronic LBP   OT comments  Patient received in supine asking to use bathroom. Patient was supervision for bed mobility and CGA to transfer to toilet. Patient performed grooming tasks with patient able to retrieve items and locate on sink with min cues. Patient performed further visual scanning while seated in recliner with less head tilt and increased peripheral vision. Patient will benefit from intensive inpatient follow up therapy, >3 hours/day to continue to address cognition and vision. Acute OT to continue to follow.       If plan is discharge home, recommend the following:  A little help with walking and/or transfers;A little help with bathing/dressing/bathroom;Assist for transportation   Equipment Recommendations  None recommended by OT    Recommendations for Other Services      Precautions / Restrictions Precautions Precautions: Fall Precaution Comments: decreased peripheral vision (possible L more than R) Restrictions Weight Bearing Restrictions: No       Mobility Bed Mobility Overal bed mobility: Needs Assistance Bed Mobility: Supine to Sit     Supine to sit: Supervision     General bed mobility comments: no physical assistance needed    Transfers Overall transfer level: Needs assistance Equipment used: None Transfers: Sit to/from Stand Sit to Stand: Contact guard  assist           General transfer comment: assist for lines and balance     Balance Overall balance assessment: Mild deficits observed, not formally tested                                         ADL either performed or assessed with clinical judgement   ADL Overall ADL's : Needs assistance/impaired     Grooming: Wash/dry hands;Wash/dry face;Oral care;Supervision/safety;Standing Grooming Details (indicate cue type and reason): patient able to locate items for grooming tasks                 Toilet Transfer: Ambulance person;Ambulation Toilet Transfer Details (indicate cue type and reason): cues for safety Toileting- Clothing Manipulation and Hygiene: Supervision/safety;Sitting/lateral lean              Extremity/Trunk Assessment              Vision   Eye Alignment: Within Functional Limits Ocular Range of Motion: Within Functional Limits Additional Comments: no head tilt on this date and improvement with visual scanning   Perception     Praxis      Cognition Arousal: Alert Behavior During Therapy: Flat affect Overall Cognitive Status: Impaired/Different from baseline Area of Impairment: Safety/judgement, Awareness                         Safety/Judgement: Decreased awareness of deficits, Decreased awareness of safety Awareness: Emergent  Exercises      Shoulder Instructions       General Comments BP while seated up in recliner 131/91 (100)    Pertinent Vitals/ Pain       Pain Assessment Pain Assessment: Faces Faces Pain Scale: Hurts a little bit Pain Location: headache Pain Descriptors / Indicators: Headache Pain Intervention(s): Monitored during session, Patient requesting pain meds-RN notified  Home Living                                          Prior Functioning/Environment              Frequency  Min 1X/week        Progress Toward  Goals  OT Goals(current goals can now be found in the care plan section)  Progress towards OT goals: Progressing toward goals  Acute Rehab OT Goals Patient Stated Goal: return home OT Goal Formulation: With patient Time For Goal Achievement: 04/26/23 Potential to Achieve Goals: Good ADL Goals Additional ADL Goal #1: pt will demonstrate grooming task supervision gather all needed items Additional ADL Goal #2: pt will complete two visual scanning task with 2 or less errors Additional ADL Goal #3: pt will complete money management task mod I  Plan      Co-evaluation                 AM-PAC OT "6 Clicks" Daily Activity     Outcome Measure   Help from another person eating meals?: A Little Help from another person taking care of personal grooming?: A Little Help from another person toileting, which includes using toliet, bedpan, or urinal?: A Little Help from another person bathing (including washing, rinsing, drying)?: A Little Help from another person to put on and taking off regular upper body clothing?: A Little Help from another person to put on and taking off regular lower body clothing?: A Little 6 Click Score: 18    End of Session Equipment Utilized During Treatment: Gait belt  OT Visit Diagnosis: Unsteadiness on feet (R26.81);Muscle weakness (generalized) (M62.81)   Activity Tolerance Patient tolerated treatment well   Patient Left in chair;with call bell/phone within reach;with chair alarm set   Nurse Communication Mobility status;Patient requests pain meds        Time: 0723-0752 OT Time Calculation (min): 29 min  Charges: OT General Charges $OT Visit: 1 Visit OT Treatments $Self Care/Home Management : 23-37 mins  Alfonse Flavors, OTA Acute Rehabilitation Services  Office [phone]   Dewain Penning 04/13/2023, 8:26 AM

## 2023-04-13 NOTE — Progress Notes (Signed)
Heart Failure Navigator Progress Note  Assessed for Heart & Vascular TOC clinic readiness.  Patient .admitted with CVA, EF 30-35%, patient with RUE weakness and expressive aphasia. Patient has a [facility] follow up appointment on 04/18/2023.   Navigator will sign off at this time.   Rhae Hammock, BSN, Scientist, clinical (histocompatibility and immunogenetics) Only

## 2023-04-13 NOTE — TOC CM/SW Note (Signed)
Transition of Care [facility]) - Inpatient Brief Assessment   Patient Details  Name: Chelsea Branch MRN: [id number] Date of Birth: Jul 27, 1965  [facility] of [facility] CM/SW Contact:    Mearl Latin, LCSW Phone Number: 04/13/2023, 11:03 AM   Clinical Narrative: Patient admitted from home where she was independent and working. Currently undergoing workup for stroke. CIR is currently recommended so TOC will follow for discharge needs.    Transition of Care Asessment: Insurance and Status: Insurance coverage has been reviewed Patient has primary care physician: Yes Home environment has been reviewed: From home Prior level of function:: Independent Prior/Current Home Services: No current home services Social Determinants of Health Reivew: SDOH reviewed no interventions necessary Readmission risk has been reviewed: Yes Transition of care needs: transition of care needs identified, TOC will continue to follow

## 2023-04-13 NOTE — Discharge Summary (Addendum)
Stroke Discharge Summary  [name]   MRN: [id number]      DOB: 31-Aug-1965  Date of Admission: 04/11/2023 Date of Discharge: 04/13/2023  Attending Physician: Dr. Pearlean Brownie, Stroke MD Consultant(s):    None  Patient's PCP:  Storm Frisk, MD  DISCHARGE DIAGNOSIS:  Acute infarcts in the right occipital lobe, posterior aspect of left insula and left cerebellar hemisphere, possible tiny acute infarction in the left parietal lobe,trace petechial hemorrhage in the medial aspect of the right -etiology cryptogenic versus cardioembolic  Principal Problem:   Acute cerebral infarction [facility]) Aphasia Active Problems:   HLD (hyperlipidemia)   History of cardiovascular disorder   Essential hypertension   Chronic combined systolic and diastolic CHF (congestive heart failure) (HCC)   Cerebellar stroke, acute (HCC) Mild obesity Smoking Borderline diabetes   Allergies as of 04/13/2023       Reactions   Ace Inhibitors Cough        Medication List     STOP taking these medications    ibuprofen 800 MG tablet Commonly known as: ADVIL       TAKE these medications    acetaminophen 500 MG tablet Commonly known as: TYLENOL Take 1,000 mg by mouth as needed for mild pain (pain score 1-3) or moderate pain (pain score 4-6).   albuterol 108 (90 Base) MCG/ACT inhaler Commonly known as: VENTOLIN HFA Inhale 2 puffs into the lungs every 4 (four) hours as needed for wheezing or shortness of breath.   aspirin 81 MG chewable tablet Chew 1 tablet (81 mg total) by mouth daily. Start taking on: April 14, 2023   atorvastatin 40 MG tablet Commonly known as: LIPITOR Take 1 tablet (40 mg total) by mouth daily. Start taking on: April 14, 2023 What changed:  medication strength how much to take   clopidogrel 75 MG tablet Commonly known as: PLAVIX Take 1 tablet (75 mg total) by mouth daily for 20 days. Start taking on: April 14, 2023   fluticasone 50 MCG/ACT nasal  spray Commonly known as: FLONASE Place 2 sprays into both nostrils daily.   metoprolol succinate 50 MG 24 hr tablet Commonly known as: TOPROL-XL Take 1 tablet (50 mg total) by mouth daily. Please keep upcoming appointment in April 2024 for future refills. Thank you What changed:  how much to take additional instructions   Polyethylene Glycol 4500 Powd 17 g by Does not apply route daily at 6 (six) AM. Dissolve 1 capful of powder in one 8 ounce glass of water. Repeat daily or as needed to soften stool What changed:  when to take this reasons to take this additional instructions   potassium chloride SA 20 MEQ tablet Commonly known as: Klor-Con M20 TAKE 2 TABLETS IN THE MORNING AND 1 TABLET IN THE EVENING AS DIRECTED What changed:  how much to take how to take this when to take this additional instructions   sacubitril-valsartan 24-26 MG Commonly known as: ENTRESTO Take 1 tablet by mouth 2 (two) times daily. What changed: when to take this   spironolactone 25 MG tablet Commonly known as: ALDACTONE Take 0.5 tablets (12.5 mg total) by mouth daily.   torsemide 20 MG tablet Commonly known as: DEMADEX Take 1 tablet (20 mg total) by mouth daily.        LABORATORY STUDIES CBC    Component Value Date/Time   WBC 5.3 04/11/2023 1351   RBC 5.13 (H) 04/11/2023 1351   HGB 16.0 (H) 04/11/2023 1359  HGB 14.8 01/12/2023 1042   HCT 47.0 (H) 04/11/2023 1359   HCT 43.7 01/12/2023 1042   PLT 206 04/11/2023 1351   PLT 215 01/12/2023 1042   MCV 86.9 04/11/2023 1351   MCV 87 01/12/2023 1042   MCH 28.7 04/11/2023 1351   MCHC 33.0 04/11/2023 1351   RDW 12.4 04/11/2023 1351   RDW 13.8 01/12/2023 1042   LYMPHSABS 1.9 04/11/2023 1351   LYMPHSABS 1.9 01/12/2023 1042   MONOABS 0.4 04/11/2023 1351   EOSABS 0.0 04/11/2023 1351   EOSABS 0.1 01/12/2023 1042   BASOSABS 0.0 04/11/2023 1351   BASOSABS 0.0 01/12/2023 1042   CMP    Component Value Date/Time   NA 139 04/11/2023 1359    NA 141 01/12/2023 1042   K 3.0 (L) 04/11/2023 1359   CL 98 04/11/2023 1359   CO2 26 04/11/2023 1351   GLUCOSE 106 (H) 04/11/2023 1359   BUN 12 04/11/2023 1359   BUN 16 01/12/2023 1042   CREATININE 1.10 (H) 04/11/2023 1359   CREATININE 0.82 04/06/2016 1441   CALCIUM 8.9 04/11/2023 1351   PROT 7.4 04/11/2023 1351   PROT 6.6 01/12/2023 1042   ALBUMIN 3.2 (L) 04/11/2023 1351   ALBUMIN 4.0 01/12/2023 1042   AST 21 04/11/2023 1351   ALT 25 04/11/2023 1351   ALKPHOS 68 04/11/2023 1351   BILITOT 1.1 04/11/2023 1351   BILITOT 0.4 01/12/2023 1042   GFRNONAA 49 (L) 04/11/2023 1351   GFRNONAA 83 04/06/2016 1441   GFRAA 71 10/11/2019 1012   GFRAA >89 04/06/2016 1441   COAGS Lab Results  Component Value Date   INR 1.1 04/11/2023   INR 1.06 06/14/2015   INR 1.10 07/31/2013   Lipid Panel    Component Value Date/Time   CHOL 114 04/12/2023 0727   CHOL 150 07/21/2021 1039   TRIG 58 04/12/2023 0727   HDL 33 (L) 04/12/2023 0727   HDL 42 07/21/2021 1039   CHOLHDL 3.5 04/12/2023 0727   VLDL 12 04/12/2023 0727   LDLCALC 69 04/12/2023 0727   LDLCALC 85 07/21/2021 1039   HgbA1C  Lab Results  Component Value Date   HGBA1C 6.3 (H) 04/11/2023   Urinalysis    Component Value Date/Time   COLORURINE YELLOW 04/11/2023 1459   APPEARANCEUR CLEAR 04/11/2023 1459   APPEARANCEUR Clear 01/12/2023 1042   LABSPEC 1.024 04/11/2023 1459   PHURINE 5.0 04/11/2023 1459   GLUCOSEU NEGATIVE 04/11/2023 1459   HGBUR NEGATIVE 04/11/2023 1459   BILIRUBINUR NEGATIVE 04/11/2023 1459   BILIRUBINUR negative 03/19/2023 1158   BILIRUBINUR CANCELED 01/12/2023 1042   KETONESUR NEGATIVE 04/11/2023 1459   PROTEINUR NEGATIVE 04/11/2023 1459   UROBILINOGEN 0.2 03/19/2023 1158   UROBILINOGEN 0.2 02/21/2022 1750   NITRITE NEGATIVE 04/11/2023 1459   LEUKOCYTESUR TRACE (A) 04/11/2023 1459   Urine Drug Screen     Component Value Date/Time   LABOPIA NONE DETECTED 04/11/2023 1459   COCAINSCRNUR NONE DETECTED  04/11/2023 1459   LABBENZ NONE DETECTED 04/11/2023 1459   AMPHETMU NONE DETECTED 04/11/2023 1459   THCU NONE DETECTED 04/11/2023 1459   LABBARB NONE DETECTED 04/11/2023 1459    Alcohol Level    Component Value Date/Time   ETH <10 04/11/2023 1351     SIGNIFICANT DIAGNOSTIC STUDIES VAS [place] TRANSCRANIAL DOPPLER W BUBBLES  Result Date: 04/12/2023  Transcranial Doppler with Bubble Patient Name:  TORRANCE Palacios  Date of Exam:   04/12/2023 Medical Rec #: [id number]     Accession #:    [id number] Date of Birth:  [birth year]     Patient Gender: F Patient Age:   57 years Exam Location:  [facility] Procedure:      VAS [place] TRANSCRANIAL DOPPLER W BUBBLES Referring Phys: Brier Firebaugh --------------------------------------------------------------------------------  Indications: Stroke. Comparison Study: No previous exams Performing Technologist: Jody Hill RVT, RDMS  Examination Guidelines: A complete evaluation includes B-mode imaging, spectral Doppler, color Doppler, and power Doppler as needed of all accessible portions of each vessel. Bilateral testing is considered an integral part of a complete examination. Limited examinations for reoccurring indications may be performed as noted.  Summary: No HITS at rest or during Valsalva. Negative transcranial Doppler Bubble study with no evidence of right to left intracardiac communication.  A vascular evaluation was performed. The left PCA was studied. An IV was inserted into the patient's right right forearm. Verbal informed consent was obtained.  *See table(s) above for TCD measurements and observations.    Preliminary    ECHOCARDIOGRAM COMPLETE  Result Date: 04/12/2023    ECHOCARDIOGRAM REPORT   Patient Name:   DENORA Wantz Date of Exam: 04/12/2023 Medical Rec #:  [id number]    Height:       68.0 in Accession #:    [id number]   Weight:       196.0 lb Date of Birth:  June 26, 1965    BSA:          2.026 m Patient Age:    57 years     BP:           126/106 mmHg  Patient Gender: F            HR:           84 bpm. Exam Location:  Inpatient Procedure: 2D Echo, Cardiac Doppler, 3D Echo and Color Doppler Indications:    Stroke I63.9  History:        Patient has prior history of Echocardiogram examinations, most                 recent 08/25/2021. CHF, CAD and Previous Myocardial Infarction,                 Stroke and COPD; Risk Factors:Hypertension, Dyslipidemia, Former                 Smoker and Sleep Apnea.  Sonographer:    Dondra Prader RVT RCS Referring Phys: [id number] [facility] IMPRESSIONS  1. Left ventricular ejection fraction, by estimation, is 30 to 35%. Left ventricular ejection fraction by 3D volume is 33 %. The left ventricle has moderately decreased function. The left ventricle demonstrates global hypokinesis. The left ventricular internal cavity size was severely dilated. There is mild concentric left ventricular hypertrophy. Left ventricular diastolic parameters are consistent with Grade III diastolic dysfunction (restrictive). Elevated left ventricular end-diastolic pressure.  2. Right ventricular systolic function is normal. The right ventricular size is normal. Tricuspid regurgitation signal is inadequate for assessing PA pressure.  3. Left atrial size was severely dilated.  4. The mitral valve is normal in structure. Moderate mitral valve regurgitation. No evidence of mitral stenosis.  5. The aortic valve is tricuspid. Aortic valve regurgitation is not visualized. Aortic valve sclerosis/calcification is present, without any evidence of aortic stenosis. Aortic valve area, by VTI measures 2.32 cm. Aortic valve mean gradient measures 4.0 mmHg. Aortic valve Vmax measures 1.33 m/s.  6. Aortic dilatation noted. There is mild dilatation of the ascending aorta, measuring 38 mm.  7. The inferior vena cava is normal in size with greater  than 50% respiratory variability, suggesting right atrial pressure of 3 mmHg.  8. Compared to prior echo, LVF has declined.  Comparison(s): EF 60-65%. FINDINGS  Left Ventricle: Left ventricular ejection fraction, by estimation, is 30 to 35%. Left ventricular ejection fraction by 3D volume is 33 %. The left ventricle has moderately decreased function. The left ventricle demonstrates global hypokinesis. The left ventricular internal cavity size was severely dilated. There is mild concentric left ventricular hypertrophy. Left ventricular diastolic parameters are consistent with Grade III diastolic dysfunction (restrictive). Elevated left ventricular end-diastolic  pressure. Right Ventricle: The right ventricular size is normal. No increase in right ventricular wall thickness. Right ventricular systolic function is normal. Tricuspid regurgitation signal is inadequate for assessing PA pressure. Left Atrium: Left atrial size was severely dilated. Right Atrium: Right atrial size was normal in size. Pericardium: There is no evidence of pericardial effusion. Mitral Valve: The mitral valve is normal in structure. Moderate mitral valve regurgitation. No evidence of mitral valve stenosis. Tricuspid Valve: The tricuspid valve is normal in structure. Tricuspid valve regurgitation is trivial. No evidence of tricuspid stenosis. Aortic Valve: The aortic valve is tricuspid. Aortic valve regurgitation is not visualized. Aortic valve sclerosis/calcification is present, without any evidence of aortic stenosis. Aortic valve mean gradient measures 4.0 mmHg. Aortic valve peak gradient measures 7.1 mmHg. Aortic valve area, by VTI measures 2.32 cm. Pulmonic Valve: The pulmonic valve was normal in structure. Pulmonic valve regurgitation is mild. No evidence of pulmonic stenosis. Aorta: Aortic dilatation noted. There is mild dilatation of the ascending aorta, measuring 38 mm. Venous: The inferior vena cava is normal in size with greater than 50% respiratory variability, suggesting right atrial pressure of 3 mmHg. IAS/Shunts: No atrial level shunt detected by color  flow Doppler.  LEFT VENTRICLE PLAX 2D LVIDd:         6.20 cm         Diastology LVIDs:         4.40 cm         LV e' medial:    3.09 cm/s LV PW:         1.20 cm         LV E/e' medial:  41.7 LV IVS:        1.20 cm         LV e' lateral:   7.97 cm/s LVOT diam:     2.10 cm         LV E/e' lateral: 16.2 LV SV:         54 LV SV Index:   27 LVOT Area:     3.46 cm        3D Volume EF                                LV 3D EF:    Left                                             ventricul                                             ar  ejection                                             fraction                                             by 3D                                             volume is                                             33 %.                                 3D Volume EF:                                3D EF:        33 %                                LV EDV:       221 ml                                LV ESV:       148 ml                                LV SV:        72 ml RIGHT VENTRICLE             IVC RV S prime:     13.10 cm/s  IVC diam: 1.90 cm TAPSE (M-mode): 2.1 cm LEFT ATRIUM             Index        RIGHT ATRIUM           Index LA diam:        4.60 cm 2.27 cm/m   RA Area:     11.30 cm LA Vol (A2C):   95.0 ml 46.88 ml/m  RA Volume:   29.10 ml  14.36 ml/m LA Vol (A4C):   97.3 ml 48.02 ml/m LA Biplane Vol: 96.2 ml 47.48 ml/m  AORTIC VALVE                    PULMONIC VALVE AV Area (Vmax):    2.57 cm     PV Vmax:       0.81 m/s AV Area (Vmean):   2.35 cm     PV Peak grad:  2.6 mmHg AV Area (VTI):     2.32 cm AV Vmax:           133.00 cm/s AV Vmean:  89.800 cm/s AV VTI:            0.234 m AV Peak Grad:      7.1 mmHg AV Mean Grad:      4.0 mmHg LVOT Vmax:         98.50 cm/s LVOT Vmean:        60.900 cm/s LVOT VTI:          0.157 m LVOT/AV VTI ratio: 0.67  AORTA Ao Root diam: 3.10 cm Ao Asc diam:  3.80 cm MITRAL VALVE MV Area (PHT): 6.54 cm        SHUNTS MV Decel Time: 116 msec       Systemic VTI:  0.16 m MR Peak grad:    113.6 mmHg   Systemic Diam: 2.10 cm MR Mean grad:    74.0 mmHg MR Vmax:         533.00 cm/s MR Vmean:        410.0 cm/s MR PISA:         5.09 cm MR PISA Eff ROA: 34 mm MR PISA Radius:  0.90 cm MV E velocity: 129.00 cm/s MV A velocity: 61.60 cm/s MV E/A ratio:  2.09 Armanda Magic MD Electronically signed by Armanda Magic MD Signature Date/Time: 04/12/2023/2:37:35 PM    Final    MR BRAIN WO CONTRAST  Result Date: 04/12/2023 CLINICAL DATA:  Neuro deficit, acute, stroke suspected. Altered mental status, blurred vision and headache. EXAM: MRI HEAD WITHOUT CONTRAST TECHNIQUE: Multiplanar, multiecho pulse sequences of the brain and surrounding structures were obtained without intravenous contrast. COMPARISON:  MRI brain 05/18/2010. Head CT and CTA head/neck 04/11/2023. FINDINGS: Brain: Acute infarcts in the right occipital lobe, along the posterior aspect of the left insula, and in the left cerebellar hemisphere with additional tiny foci of possible acute infarction in the left parietal lobe. Findings are suspicious for cardioembolic etiology. Trace petechial hemorrhage in the medial aspect of the right occipital lobe and posterior left insula. No significant mass effect. Background of mild chronic small-vessel disease with old cortical infarct along the left middle frontal gyrus. No hydrocephalus or extra-axial collection. No mass or midline shift. Vascular: Normal flow voids. Skull and upper cervical spine: Normal marrow signal. Sinuses/Orbits: No acute findings. Other: None. IMPRESSION: Acute infarcts in the right occipital lobe, along the posterior aspect of the left insula, and in the left cerebellar hemisphere with additional tiny foci of possible acute infarction in the left parietal lobe. Findings are suspicious for cardioembolic etiology. Trace petechial hemorrhage in the medial aspect of the right occipital lobe and posterior left  insula. No significant mass effect. Electronically Signed   By: Orvan Falconer M.D.   On: 04/12/2023 12:38   CT HEAD WO CONTRAST ( )  Result Date: 04/12/2023 CLINICAL DATA:  Stroke, follow-up EXAM: CT HEAD WITHOUT CONTRAST TECHNIQUE: Contiguous axial images were obtained from the base of the skull through the vertex without intravenous contrast. RADIATION DOSE REDUCTION: This exam was performed according to the departmental dose-optimization program which includes automated exposure control, adjustment of the mA and/or kV according to patient size and/or use of iterative reconstruction technique. COMPARISON:  04/11/2023 CTA head and neck FINDINGS: Brain: Hypodensity and loss of gray-white differentiation in the right occipital lobe (series 3, image 9 and series 6, image 24), which was not evident prior exam. No acute hemorrhage, mass, mass effect, midline shift. No hydrocephalus or extra-axial collection. Vascular: No hyperdense vessel. Skull: Negative for fracture or focal lesion. Sinuses/Orbits: No acute finding. Other: The  mastoid air cells are well aerated. IMPRESSION: Hypodensity and loss of gray-white differentiation in the right occipital lobe, which was not evident prior exam, concerning for an acute infarct. An MRI is recommended. No evidence of hemorrhage. These results will be called to the ordering clinician or representative by the Radiologist Assistant, and communication documented in the PACS or [employer]. Electronically Signed   By: Wiliam Ke M.D.   On: 04/12/2023 01:06   CT ANGIO HEAD NECK W WO CM  Result Date: 04/11/2023 CLINICAL DATA:  Neuro deficit, acute, stroke suspected Sudden onst severe headache - no hx of same EXAM: CT ANGIOGRAPHY HEAD AND NECK WITH AND WITHOUT CONTRAST TECHNIQUE: Multidetector CT imaging of the head and neck was performed using the standard protocol during bolus administration of intravenous contrast. Multiplanar CT image reconstructions and MIPs  were obtained to evaluate the vascular anatomy. Carotid stenosis measurements (when applicable) are obtained utilizing NASCET criteria, using the distal internal carotid diameter as the denominator. RADIATION DOSE REDUCTION: This exam was performed according to the departmental dose-optimization program which includes automated exposure control, adjustment of the mA and/or kV according to patient size and/or use of iterative reconstruction technique. CONTRAST:  75mL OMNIPAQUE IOHEXOL 350 MG/ML SOLN COMPARISON:  None Available. FINDINGS: CT HEAD FINDINGS Brain: No hemorrhage. No hydrocephalus. No extra-axial fluid collection. No CT evidence of an acute cortical infarct. No mass effect. No mass lesion. Vascular: No hyperdense vessel or unexpected calcification. Skull: Nonspecific lucent lesion along the left parietal calvarium, unchanged from 2016, and likely a hemangioma. Sinuses/Orbits: No middle ear or mastoid effusion. Paranasal sinuses are clear. Orbits are unremarkable. Other: None. Review of the MIP images confirms the above findings CTA NECK FINDINGS Aortic arch: Standard branching. Imaged portion shows no evidence of aneurysm or dissection. No significant stenosis of the major arch vessel origins. Right carotid system: No evidence of dissection, stenosis (50% or greater), or occlusion. Left carotid system: No evidence of dissection, stenosis (50% or greater), or occlusion. Vertebral arteries: Codominant. No evidence of dissection, stenosis (50% or greater), or occlusion. Skeleton: Negative. Other neck: Negative. Upper chest: Mild centrilobular emphysema. Review of the MIP images confirms the above findings CTA HEAD FINDINGS Anterior circulation: No significant stenosis, proximal occlusion, aneurysm, or vascular malformation. Posterior circulation: No significant stenosis, proximal occlusion, aneurysm, or vascular malformation. Venous sinuses: As permitted by contrast timing, patent. Anatomic variants: None  Review of the MIP images confirms the above findings IMPRESSION: 1. No acute intracranial abnormality. 2. No intracranial large vessel occlusion or significant stenosis. 3. No hemodynamically significant stenosis in the neck. Emphysema (ICD10-J43.9). Electronically Signed   By: Lorenza Cambridge M.D.   On: 04/11/2023 14:59      HISTORY OF PRESENT ILLNESS anda Zinn is a 57 y.o. female with PMH significant for Stroke 2011, OSA, Smoker, Chronic combined CHF, HTN, NSTEMI 2017, NIDCM who presented to the ED d/t change in mental status/"feeling off", blurred vision, headache, dry mouth while at work. Patient was able to describe all of this while in triage with no aphasia or dysarthria present and vision was improving, per notes. Patient was taken for CT/CTA, which was negative.  At approximately 1427, patient went from talking and interacting normally with nursing staff to having expressive aphasia and a CODE STROKE was activated.  On exam at bedside, patient with RUE weakness, confusion and receptive/expressive aphasia. NIH 7   Management with thrombolytic therapy was explained to the patient, and patient's son Stann Ore over the phone, as were risks,  benefits and alternatives. All questions were answered. Patient, or patient's representative, expressed understanding of the treatment plan and agreed to proceed with thrombolytic treatment.TNK given at 1502.    LKW: 1427 04/11/23 TNK given? Yes, 1502 04/11/23  Mechanical thrombectomy? No, no LVO  Risks, benefits, alternatives of IV thrombolysis were discussed and family/patient agreed to proceed. CT imaging was reviewed personally prior to IV thrombolysis administration with no evidence of bleed.   Premorbid modified Rankin scale (mRS): 0   NIH on Admission 7   HOSPITAL COURSE Acute infarcts in the right occipital lobe, posterior aspect of left insula and left cerebellar hemisphere, possible tiny acute infarction in the left parietal lobe,trace  petechial hemorrhage in the medial aspect of the right s/p TNK at 1502 11/18  Etiology: Suspect cardio embolic versus cryptogenic CTA head & neck No LVO or significant stenosis   Code Stroke CT head hypodensity and loss of gray-white differentiation in the right occipital lobe, which was not evident prior exam, concerning for acute infarct. MRI acute infarcts in the right occipital lobe, posterior aspect of left insulae and left cerebellar hemisphere, possible tiny acute infarction in the left parietal lobe,trace petechial hemorrhage in the medial aspect of the right occipital lobe and posterior left insula 2D Echo EF 30-35%, moderately decreased LVH function, w/ mild concentric LVH, Grade III Diastolic dysfunction, LA severely dilated  TEE ordered, however patient ate prior to procedure and was encouraged to get outpatient on 11/25 LDL 69 HgbA1c 6.3 UDS negative  VTE prophylaxis - Holding for now  No antithrombotic prior to admission, will start on aspirin 81 mg daily and clopidogrel 75 mg daily for 3 weeks and then ASA alone Therapy recommendations:  Outpatient SLP and PT  Disposition: Home Hx of Stroke/TIA Prior history of stroke in 2011  Discontinued daily ASA 1-2 years ago  Continue GDMT Start on  aspirin 81 mg daily and clopidogrel 75 mg daily for 3 weeks and then ASA alone   Hypertension Home meds:  Spironolactone 12.5 mg daily, entresto 24-26 mg daily, metoprolol 75 mg daily Resumed home medications  Blood Pressure Goal: BP less than 220/110    Hyperlipidemia Home meds: Lipitor 10 mg daily resumed  LDL 69, goal < 70 Continue statin at discharge   Pre-Diabetes Controlled  Home meds:  None  HgbA1c 6.3, goal < 7.0 Recommend close follow-up with PCP for better DM control   Former Physicist, medical  Previously smoked cigarettes, unknown PPD history, quite in 2017    Other Stroke Risk Factors Obesity, Body mass index is 29.8 kg/m., BMI >/= 30 associated with increased stroke  risk, recommend weight loss, diet and exercise as appropriate  Family hx stroke (sister) 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 rate 16, height 5\' 8"  (1.727 m), weight 88.9 kg, SpO2 98%. On room air   Mental Status: AA&O to place, person, situation. Speech/Language: speech is without dysarthria, mild-moderate aphasia.  Expressive greater than receptive.  Word finding difficulties, paraphasic errors, able to name and repeat well.   Cranial Nerves:  II: PERRL. Visual fields intact .  III, IV, VI: EOMI. Eyelids elevate symmetrically.  V: Sensation is intact to light touch and symmetrical to face.  VII: Face is symmetrical resting and smiling VIII: hearing intact to voice. IX, X: Palate elevates symmetrically. Phonation is normal.  UU:[id number] shrug 5/5. XII: tongue is midline without fasciculations. Motor: 4/5 strength on RUE  Tone: is normal and bulk is normal Sensation- Intact to light touch bilaterally. Extinction absent to light touch to DSS.   Coordination: FTN intact bilaterally, HKS: no ataxia in BLE.No drift.  Gait- deferred   Discharge NIH 2  Discharge Diet    Heart Health Diet   DISCHARGE PLAN Disposition:  Home aspirin 81 mg daily and clopidogrel 75 mg daily for secondary stroke prevention for 3 weeks then aspirin alone. Outpatient PT and SLP recommended  Ongoing stroke risk factor control by Primary Care Physician at time of discharge Follow-up PCP Storm Frisk, MD in 2 weeks. Follow-up with  cardiology outpatient TEE scheduled 04/18/2023  Follow-up in [facility] in 8 weeks, office to schedule an appointment.   35 minutes were spent preparing discharge.  I have personally obtained history,examined this patient, reviewed notes, independently viewed imaging studies, participated in medical decision making and plan of care.ROS completed by me personally and pertinent positives fully documented  I have made any additions or clarifications directly to the above note. Agree with note above.    Delia Heady, MD Medical Director [facility] Pager: [phone] 04/13/2023 1:30 PM

## 2023-04-13 NOTE — Plan of Care (Signed)
  Problem: Education: Goal: Knowledge of disease or condition will improve Outcome: Progressing Goal: Knowledge of secondary prevention will improve (MUST DOCUMENT ALL) Outcome: Progressing Goal: Knowledge of patient specific risk factors will improve Loraine Leriche N/A or DELETE if not current risk factor) Outcome: Progressing   Problem: Ischemic Stroke/TIA Tissue Perfusion: Goal: Complications of ischemic stroke/TIA will be minimized Outcome: Progressing   Problem: Coping: Goal: Will verbalize positive feelings about self Outcome: Progressing Goal: Will identify appropriate support needs Outcome: Progressing   Problem: Health Behavior/Discharge Planning: Goal: Ability to manage health-related needs will improve Outcome: Progressing Goal: Goals will be collaboratively established with patient/family Outcome: Progressing   Problem: Self-Care: Goal: Ability to participate in self-care as condition permits will improve Outcome: Progressing Goal: Verbalization of feelings and concerns over difficulty with self-care will improve Outcome: Progressing Goal: Ability to communicate needs accurately will improve Outcome: Progressing   Problem: Nutrition: Goal: Risk of aspiration will decrease Outcome: Progressing Goal: Dietary intake will improve Outcome: Progressing   Problem: Education: Goal: Knowledge of General Education information will improve Description: Including pain rating scale, medication(s)/side effects and non-pharmacologic comfort measures Outcome: Progressing   Problem: Health Behavior/Discharge Planning: Goal: Ability to manage health-related needs will improve Outcome: Progressing   Problem: Clinical Measurements: Goal: Ability to maintain clinical measurements within normal limits will improve Outcome: Progressing Goal: Will remain free from infection Outcome: Progressing Goal: Diagnostic test results will improve Outcome: Progressing   Problem:  Activity: Goal: Risk for activity intolerance will decrease Outcome: Progressing   Problem: Nutrition: Goal: Adequate nutrition will be maintained Outcome: Progressing   Problem: Coping: Goal: Level of anxiety will decrease Outcome: Progressing   Problem: Pain Management: Goal: General experience of comfort will improve Outcome: Progressing   Problem: Safety: Goal: Ability to remain free from injury will improve Outcome: Progressing   Problem: Skin Integrity: Goal: Risk for impaired skin integrity will decrease Outcome: Progressing

## 2023-04-13 NOTE — Progress Notes (Signed)
Physical Therapy Treatment Patient Details Name: Chelsea Branch MRN: [id number] DOB: 04-19-1966 Today's Date: 04/13/2023   History of Present Illness 57 yo female expressive aphasia, R UE weakness confusion NIH 7 TNK given 1502PM 11/18  MRI (+) Acute infarcts R occipital lobe, along the posterior aspect of the left insula, and in the L cerebellar hemispher,infarction in the L  parietal lobe. Findings are suspicious for cardioembolic etiology.Trace petechial hemorrhage in the medial aspect of the right  occipital lobe and posterior left insula.  PMH DDD CVA 2012 OSA smoker CHF HTN Nstemi 2017 NIDCM Chronic LBP    PT Comments  Patient progressing with mobility, speech and cognition.  Still with some imbalance, decreased deficit awareness and slower more labored recall of words at times.  She will have assistance at home with multiple family (sisters, sons, etc).  Feel she can d/c home with follow up outpatient PT for balance, coordination and cognitive rehab.  Will follow up if not d/c.     If plan is discharge home, recommend the following: A little help with walking and/or transfers;Assistance with cooking/housework;Assist for transportation;Help with stairs or ramp for entrance   Can travel by private vehicle        Equipment Recommendations  None recommended by PT    Recommendations for Other Services       Precautions / Restrictions Precautions Precautions: Fall     Mobility  Bed Mobility               General bed mobility comments: up in chair    Transfers   Equipment used: None Transfers: Sit to/from Stand Sit to Stand: Contact guard assist           General transfer comment: initial mild imbalance when stepping after standing    Ambulation/Gait Ambulation/Gait assistance: Supervision, Contact guard assist Gait Distance (Feet): 400 Feet Assistive device: None Gait Pattern/deviations: Step-through pattern, Decreased stride length       General Gait  Details: in hallway with occasional CGA for direction changes, no LOB noted though some cross steps with turning; able to stop and turn around and back up a few short steps; veers to L with L head turns; gait in room small spaces with S to closet, to bathroom, etc without LOB   Stairs Stairs: Yes Stairs assistance: Contact guard assist Stair Management: One rail Left, Step to pattern, Forwards Number of Stairs: 10 General stair comments: step through pattern to ascend, step to pattern to descend   Wheelchair Mobility     Tilt Bed    Modified Rankin (Stroke Patients Only) Modified Rankin (Stroke Patients Only) Pre-Morbid Rankin Score: No symptoms Modified Rankin: Moderately severe disability     Balance Overall balance assessment: Needs assistance   Sitting balance-Leahy Scale: Good       Standing balance-Leahy Scale: Good                              Cognition Arousal: Alert Behavior During Therapy: WFL for tasks assessed/performed Overall Cognitive Status: Impaired/Different from baseline Area of Impairment: Safety/judgement, Awareness                         Safety/Judgement: Decreased awareness of deficits Awareness: Emergent            Exercises      General Comments General comments (skin integrity, edema, etc.): VSS with mobility on RA; educated in fall prevention  including footwear, lighting, items frequently used at counter level, shower mat and supervision initially to shower, clear pathways and slow to rise.      Pertinent Vitals/Pain Pain Assessment Pain Assessment: No/denies pain    Home Living                          Prior Function            PT Goals (current goals can now be found in the care plan section) Progress towards PT goals: Progressing toward goals    Frequency    Min 1X/week      PT Plan      Co-evaluation              AM-PAC PT "6 Clicks" Mobility   Outcome Measure  Help  needed turning from your back to your side while in a flat bed without using bedrails?: None Help needed moving from lying on your back to sitting on the side of a flat bed without using bedrails?: None Help needed moving to and from a bed to a chair (including a wheelchair)?: A Little Help needed standing up from a chair using your arms (e.g., wheelchair or bedside chair)?: A Little Help needed to walk in hospital room?: A Little Help needed climbing 3-5 steps with a railing? : A Little 6 Click Score: 20    End of Session Equipment Utilized During Treatment: Gait belt Activity Tolerance: Patient tolerated treatment well Patient left: in chair;with call bell/phone within reach;with chair alarm set   PT Visit Diagnosis: Other abnormalities of gait and mobility (R26.89);Other symptoms and signs involving the nervous system (R29.898)     Time: 4696-2952 PT Time Calculation (min) (ACUTE ONLY): 20 min  Charges:    $Gait Training: 8-22 mins PT General Charges $$ ACUTE PT VISIT: 1 Visit                     Sheran Lawless, PT Acute Rehabilitation Services Office:[phone] 04/13/2023    Elray Mcgregor 04/13/2023, 12:05 PM

## 2023-04-14 ENCOUNTER — Telehealth: Payer: Self-pay

## 2023-04-14 LAB — ENA+DNA/DS+ANTICH+CENTRO+JO...
Anti JO-1: <0.2 AI (ref 0.0–0.9)
Centromere Ab Screen: <0.2 AI (ref 0.0–0.9)
Chromatin Ab SerPl-aCnc: 2.9 AI — ABNORMAL HIGH (ref 0.0–0.9)
ENA SM Ab Ser-aCnc: 0.4 AI (ref 0.0–0.9)
Ribonucleic Protein: 7.2 AI — ABNORMAL HIGH (ref 0.0–0.9)
SSA (Ro) (ENA) Antibody, IgG: <0.2 AI (ref 0.0–0.9)
SSB (La) (ENA) Antibody, IgG: <0.2 AI (ref 0.0–0.9)
Scleroderma (Scl-70) (ENA) Antibody, IgG: <0.2 AI (ref 0.0–0.9)
ds DNA Ab: <1 [IU]/mL (ref 0–9)

## 2023-04-14 LAB — ANA W/REFLEX IF POSITIVE: Anti Nuclear Antibody (ANA): POSITIVE — AB

## 2023-04-14 SURGERY — TRANSESOPHAGEAL ECHOCARDIOGRAM (TEE) (CATHLAB)
Anesthesia: Monitor Anesthesia Care

## 2023-04-14 NOTE — H&P (View-Only) (Signed)
Cardiology Clinic Note   Patient Name: Chelsea Branch Date of Encounter: 04/14/2023  Primary Care Provider:  Storm Frisk, MD Primary Cardiologist:  Sherryl Manges, MD  Patient Profile    Chelsea Branch 57 year old female presents the clinic today for follow-up evaluation of her chronic combined systolic and diastolic CHF.  Past Medical History    Past Medical History:  Diagnosis Date   Abnormal thyroid blood test 03/15/2017   Abnormal uterine bleeding    Alopecia    Anemia    Angina pectoris with normal coronary arteriogram (HCC) 2017   Had + Troponin c/w ? 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 iniatially ?2/2 peripartum in 1994 - improved by 2008 then worsening EF in 2011 back down to EF 25-30%. b. echo 01/21/14 showed mod LVH, [name]; 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   Peripartum cardiomyopathy 1994   Sleep apnea 2015   CPAP 12/2013   Stroke (HCC) 2011   Systolic and diastolic CHF, acute on chronic (HCC) 06/14/2015   Tobacco abuse    Past Surgical History:  Procedure Laterality Date   CARDIAC CATHETERIZATION N/A 06/16/2015   Procedure: Left Heart Cath and Coronary Angiography;  Surgeon: Corky Crafts, MD;  Location: [facility] INVASIVE CV LAB;  Service: Cardiovascular;  Laterality: N/A;   CESAREAN SECTION  1992  1994   LOOP RECORDER IMPLANT  ~ 2000   TIBIAL TUBERCLERPLASTY     TUBAL LIGATION  1994    Allergies  Allergies  Allergen Reactions   Ace Inhibitors Cough     History of Present Illness    Chelsea Branch has a PMH of CVA, HTN, HLD, NIDCM (presumed peripartum, chronic CHF) and chronic combined systolic and diastolic CHF.  She had an NSTEMI in 2017.  She was noted to have nonobstructive coronary disease per cath.  Her PMH also includes OSA on CPAP.  She was seen in follow-up by EP on 10/04/2022.  During that time she reported fatigue and malaise for around 6-8 weeks.  She also had associated general body aches x 1 month.  She denied acute illnesses or insect bites.  She reported that she had not been seen by other providers for her concerns.  She was taking her medications as directed.  She did note some relief with Tylenol.  She denied chest pain, dyspnea, syncope, weight gain, and early satiety.  Her blood pressure was 138/88.  Her volume status was stable.  Follow-up was planned in 6 months with Dr. Graciela Husbands.  She was NYHA class II-3.  She was admitted to the hospital 04/11/2023 and discharged on 04/13/2023.  She presented to the emergency department and reported a change in her mental status.  She noted headache, dry mouth, blurred vision while at work.  She was able to describe all of her symptoms while in triage with no aphasia or dysarthria.  Her vision was improving.  She was taken for CTA/CT which was negative.  At 1427  on 04/11/2023 she had a change in her neurological status.  She was noted to have expressive aphasia right upper arm weakness, confusion, and receptive as well as expressive aphasia.  She was diagnosed with acute infarct in the right submental lobe, posterior aspect of left insula and left cerebellar hemisphere.  She received thrombin therapy.  TNK was given at 1502.  MRI showed acute infarcts of her right occipital lobe, posterior aspect of the left insulae and left cerebellar hemisphere.  Echocardiogram showed an LVEF of 30-35%, moderately decreased LVH, mild concentric LVH, G3 DD, and severely dilated left atria.  A TEE was ordered.   However, patient ate prior to procedure and she was encouraged to have TEE done on 04/18/2023.  Her urine drug screen was negative.  She was started on aspirin and Plavix with a plan to continue aspirin alone after 3 weeks.  Her potassium was noted to be 3.1.  She received supplemental potassium.  Her blood pressure was noted to be 114/100.  Outpatient physical therapy and speech therapy were recommended.  She presents to the clinic today for follow-up evaluation and states her stroke has affected her speech.  She reports that she wants to help with cooking Thanksgiving dinner.  She feels that she is ready to get back to work.  We reviewed her recent hospitalization and her stroke.  She expressed understanding.  We reviewed the importance of completing physical therapy and speech therapy.  We reviewed her upcoming appointments and need for transesophageal echocardiogram.  She expressed understanding.  I will order TEE and have her follow-up with Dr. Graciela Husbands as scheduled.  Today she denies chest pain, shortness of breath, lower extremity edema, fatigue, palpitations, melena, hematuria, hemoptysis, diaphoresis, weakness, presyncope, syncope, orthopnea, and PND.   Home Medications    Prior to Admission medications   Medication Sig Start Date End Date Taking? Authorizing Provider  acetaminophen (TYLENOL) 500 MG tablet Take 1,000 mg by mouth as needed for mild pain (pain score 1-3) or moderate pain (pain score 4-6).    [provider]  albuterol (VENTOLIN HFA) 108 (90 Base) MCG/ACT inhaler Inhale 2 puffs into the lungs every 4 (four) hours as needed for wheezing or shortness of breath. 01/12/23   Storm Frisk, MD  aspirin 81 MG chewable tablet Chew 1 tablet (81 mg total) by mouth daily. 04/14/23   Peterson Ao, MD  atorvastatin (LIPITOR) 40 MG tablet Take 1 tablet (40 mg total) by mouth daily. 04/14/23   Peterson Ao, MD  clopidogrel (PLAVIX) 75 MG tablet Take 1 tablet (75 mg total) by  mouth daily for 20 days. 04/14/23 05/04/23  Peterson Ao, MD  fluticasone (FLONASE) 50 MCG/ACT nasal spray Place 2 sprays into both nostrils daily. 04/10/23   Rising, Lurena Joiner, PA-C  metoprolol succinate (TOPROL-XL) 50 MG 24 hr tablet Take 1 tablet (50 mg total) by mouth daily. Please keep upcoming appointment in April 2024 for future refills. Thank you Patient taking differently: Take 75 mg by mouth daily. 01/12/23   Storm Frisk, MD  Polyethylene Glycol 4500 POWD 17 g by Does not apply route daily at 6 (six) AM. Dissolve 1 capful of powder in one 8 ounce glass of water. Repeat daily or as needed to soften stool Patient taking differently: 17 g by Does not apply route daily as needed (constipation). 04/10/23   Rising, Lurena Joiner, PA-C  potassium chloride SA (KLOR-CON M20) 20 MEQ tablet TAKE 2 TABLETS IN THE MORNING AND 1 TABLET IN THE EVENING AS  DIRECTED Patient taking differently: Take 40 mEq by mouth daily. 01/12/23   Storm Frisk, MD  sacubitril-valsartan (ENTRESTO) 24-26 MG Take 1 tablet by mouth 2 (two) times daily. Patient taking differently: Take 1 tablet by mouth daily. 01/12/23   Storm Frisk, MD  spironolactone (ALDACTONE) 25 MG tablet Take 0.5 tablets (12.5 mg total) by mouth daily. 01/12/23   Storm Frisk, MD  torsemide (DEMADEX) 20 MG tablet Take 1 tablet (20 mg total) by mouth daily. 01/12/23   Storm Frisk, MD  DULoxetine (CYMBALTA) 30 MG capsule Take 1 capsule (30 mg total) by mouth daily. 12/07/18 12/10/19  [name], MD  famotidine (PEPCID) 20 MG tablet Take 1 tablet (20 mg total) 2 (two) times daily by mouth. 04/10/17 11/03/19  Elisha Ponder, PA-C    Family History    Family History  Problem Relation Age of Onset   Cancer Maternal Grandmother        uterine   Hypertension Sister    Healthy Mother    Other Neg Hx    Heart disease Neg Hx    She indicated that her mother is alive. She indicated that her father is deceased. She indicated that her  sister is alive. She indicated that her maternal grandmother is deceased. She indicated that her maternal grandfather is deceased. She indicated that her paternal grandmother is deceased. She indicated that her paternal grandfather is deceased. She indicated that the status of her neg hx is unknown.  Social History    Social History   Socioeconomic History   Marital status: Single    Spouse name: Not on file   Number of children: 3   Years of education: 12   Highest education level: Not on file  Occupational History   Occupation: unemployed  Tobacco Use   Smoking status: Former    Current packs/day: 0.00    Average packs/day: 0.1 packs/day for 30.0 years (3.0 ttl pk-yrs)    Types: Cigarettes    Start date: 06/02/1985    Quit date: 06/03/2015    Years since quitting: 7.8   Smokeless tobacco: Never   Tobacco comments:    Pt. stated she stopped smoking a year ago. 09/29/2018  Vaping Use   Vaping status: Never Used  Substance and Sexual Activity   Alcohol use: No    Alcohol/week: 0.0 standard drinks of alcohol   Drug use: No   Sexual activity: Yes    Birth control/protection: I.U.D.    Comment: fibroids  Other Topics Concern   Not on file  Social History Narrative   Lives at home with 58 yo twins ([place] and [place])   77 yo son lives outside the home   29 yo granddaughter    Social Determinants of Health   Financial Resource Strain: High Risk (03/19/2023)   Overall Financial Resource Strain (CARDIA)    Difficulty of Paying Living Expenses: Very hard  Food Insecurity: Food Insecurity Present (03/19/2023)   Hunger Vital Sign    Worried About Running Out of Food in the Last Year: Sometimes true    Ran Out of Food in the Last Year: Sometimes true  Transportation Needs: Unmet Transportation Needs (03/19/2023)   PRAPARE - Administrator, Civil Service (Medical): Yes    Lack of Transportation (Non-Medical): Yes  Physical Activity: Not on file  Stress: Stress Concern  Present (03/19/2023)   [employer] of Occupational Health - Occupational Stress Questionnaire    Feeling of Stress : Rather  much  Social Connections: Socially Isolated (03/19/2023)   Social Connection and Isolation Panel [NHANES]    Frequency of Communication with Friends and Family: Once a week    Frequency of Social Gatherings with Friends and Family: Once a week    Attends Religious Services: Never    Database administrator or Organizations: No    Attends Engineer, structural: Never    Marital Status: Living with partner  Intimate Partner Violence: Not on file     Review of Systems    General:  No chills, fever, night sweats or weight changes.  Cardiovascular:  No chest pain, dyspnea on exertion, edema, orthopnea, palpitations, paroxysmal nocturnal dyspnea. Dermatological: No rash, lesions/masses Respiratory: No cough, dyspnea Urologic: No hematuria, dysuria Abdominal:   No nausea, vomiting, diarrhea, bright red blood per rectum, melena, or hematemesis Neurologic:  No visual changes, wkns, changes in mental status. All other systems reviewed and are otherwise negative except as noted above.  Physical Exam    VS:  There were no vitals taken for this visit. , BMI There is no height or weight on file to calculate BMI. GEN: Well nourished, well developed, in no acute distress. HEENT: normal. Neck: Supple, no JVD, carotid bruits, or masses. Cardiac: RRR, no murmurs, rubs, or gallops. No clubbing, cyanosis, edema.  Radials/DP/PT 2+ and equal bilaterally.  Respiratory:  Respirations regular and unlabored, clear to auscultation bilaterally. GI: Soft, nontender, nondistended, BS + x 4. MS: no deformity or atrophy. Skin: warm and dry, no rash. Neuro:  Strength and sensation are intact. Psych: Normal affect.  Accessory Clinical Findings    Recent Labs: 01/12/2023: TSH 0.384 04/11/2023: ALT 25; BUN 12; Creatinine, Ser 1.10; Hemoglobin 16.0; Platelets 206; Potassium  3.0; Sodium 139   Recent Lipid Panel    Component Value Date/Time   CHOL 114 04/12/2023 0727   CHOL 150 07/21/2021 1039   TRIG 58 04/12/2023 0727   HDL 33 (L) 04/12/2023 0727   HDL 42 07/21/2021 1039   CHOLHDL 3.5 04/12/2023 0727   VLDL 12 04/12/2023 0727   LDLCALC 69 04/12/2023 0727   LDLCALC 85 07/21/2021 1039         ECG personally reviewed by me today- None today.  TTE 04/12/2023  IMPRESSIONS     1. Left ventricular ejection fraction, by estimation, is 30 to 35%. Left  ventricular ejection fraction by 3D volume is 33 %. The left ventricle has  moderately decreased function. The left ventricle demonstrates global  hypokinesis. The left ventricular  internal cavity size was severely dilated. There is mild concentric left  ventricular hypertrophy. Left ventricular diastolic parameters are  consistent with Grade III diastolic dysfunction (restrictive). Elevated  left ventricular end-diastolic pressure.   2. Right ventricular systolic function is normal. The right ventricular  size is normal. Tricuspid regurgitation signal is inadequate for assessing  PA pressure.   3. Left atrial size was severely dilated.   4. The mitral valve is normal in structure. Moderate mitral valve  regurgitation. No evidence of mitral stenosis.   5. The aortic valve is tricuspid. Aortic valve regurgitation is not  visualized. Aortic valve sclerosis/calcification is present, without any  evidence of aortic stenosis. Aortic valve area, by VTI measures 2.32 cm.  Aortic valve mean gradient measures  4.0 mmHg. Aortic valve Vmax measures 1.33 m/s.   6. Aortic dilatation noted. There is mild dilatation of the ascending  aorta, measuring 38 mm.   7. The inferior vena cava is normal in size  with greater than 50%  respiratory variability, suggesting right atrial pressure of 3 mmHg.   8. Compared to prior echo, LVF has declined.   Comparison(s): EF 60-65%.   FINDINGS   Left Ventricle: Left  ventricular ejection fraction, by estimation, is 30  to 35%. Left ventricular ejection fraction by 3D volume is 33 %. The left  ventricle has moderately decreased function. The left ventricle  demonstrates global hypokinesis. The left  ventricular internal cavity size was severely dilated. There is mild  concentric left ventricular hypertrophy. Left ventricular diastolic  parameters are consistent with Grade III diastolic dysfunction  (restrictive). Elevated left ventricular end-diastolic   pressure.   Right Ventricle: The right ventricular size is normal. No increase in  right ventricular wall thickness. Right ventricular systolic function is  normal. Tricuspid regurgitation signal is inadequate for assessing PA  pressure.   Left Atrium: Left atrial size was severely dilated.   Right Atrium: Right atrial size was normal in size.   Pericardium: There is no evidence of pericardial effusion.   Mitral Valve: The mitral valve is normal in structure. Moderate mitral  valve regurgitation. No evidence of mitral valve stenosis.   Tricuspid Valve: The tricuspid valve is normal in structure. Tricuspid  valve regurgitation is trivial. No evidence of tricuspid stenosis.   Aortic Valve: The aortic valve is tricuspid. Aortic valve regurgitation is  not visualized. Aortic valve sclerosis/calcification is present, without  any evidence of aortic stenosis. Aortic valve mean gradient measures 4.0  mmHg. Aortic valve peak gradient  measures 7.1 mmHg. Aortic valve area, by VTI measures 2.32 cm.   Pulmonic Valve: The pulmonic valve was normal in structure. Pulmonic valve  regurgitation is mild. No evidence of pulmonic stenosis.   Aorta: Aortic dilatation noted. There is mild dilatation of the ascending  aorta, measuring 38 mm.   Venous: The inferior vena cava is normal in size with greater than 50%  respiratory variability, suggesting right atrial pressure of 3 mmHg.   IAS/Shunts: No atrial  level shunt detected by color flow Doppler.       Assessment & Plan   1.  Chronic combined systolic and diastolic CHF,Nonischemic dilated cardiomyopathy -continues to work with speech therapy and physical therapy.  Echocardiogram during admission showed EF 30-35%.   TEE was ordered during admission but not completed because pt ate prior to testing, will reorder test. Continue metoprolol, potassium, Entresto, spironolactone, torsemide Heart healthy low-sodium diet Continue to increase physical activity Order TEE  Essential hypertension-BP today 122/80 . Maintain blood pressure log Continue current medical therapy  History of CVA-presented to emergency department on 04/11/2023 and was discharged on 04/13/2023.  Was diagnosed with acute infarcts in theright occipital lobe, posterior aspect of the left insulae and left cerebellar hemisphere.  She received TNK. Continue aspirin and Plavix x 3 weeks and then aspirin alone Following with neurology Continue physical therapy and speech therapy  Disposition: Follow-up with Dr. Graciela Husbands as scheduled   Thomasene Ripple. Maui Britten NP-C     04/14/2023, 6:41 AM [facility] [address] Office [phone] Fax [phone]    I spent 14 minutes examining this patient, reviewing medications, and using patient centered shared decision making involving her cardiac care.   I spent greater than 20 minutes reviewing her past medical history,  medications, and prior cardiac tests.

## 2023-04-14 NOTE — Transitions of Care (Post Inpatient/ED Visit) (Signed)
   04/14/2023  Name: Chelsea Branch MRN: [id number] DOB: [birth year]  Today's TOC FU Call Status: Today's TOC FU Call Status:: Successful TOC FU Call Completed TOC FU Call Complete Date: 04/14/23 Patient's Name and Date of Birth confirmed.  Transition Care Management Follow-up Telephone Call Date of Discharge: 04/13/23 Discharge Facility: [facility]) Type of Discharge: Inpatient Admission Primary Inpatient Discharge Diagnosis:: acute cerebral infarct How have you been since you were released from the hospital?: Same (She said she just doesn't feel like herself.  Her sgnificant other , [name], also participated in the call.) Any questions or concerns?: No  Items Reviewed: Did you receive and understand the discharge instructions provided?: Yes Medications obtained,verified, and reconciled?: Partial Review Completed Reason for Partial Mediation Review: She said she has all medicaitons and did not have any questions about the med regime and did not need to review the med list. Any new allergies since your discharge?: No Dietary orders reviewed?: Yes Type of Diet Ordered:: heart healthy, low sodium Do you have support at home?: Yes People in Home: significant other, child(ren), adult Name of Support/Comfort Primary Source: hre significant other, [name] and her son, [name]  Medications Reviewed Today: Medications Reviewed Today   Medications were not reviewed in this encounter     Home Care and Equipment/Supplies: Were Home Health Services Ordered?: No Any new equipment or medical supplies ordered?: No  Functional Questionnaire: Do you need assistance with bathing/showering or dressing?: Yes ([name] assists as needed) Do you need assistance with meal preparation?: Yes (family assists) Do you need assistance with eating?: No Do you have difficulty maintaining continence: No Do you need assistance with getting out of bed/getting out of a chair/moving?: Yes (She has a cane to use as  needed) Do you have difficulty managing or taking your medications?: No (She stated she manges her own medications)  Follow up appointments reviewed: PCP Follow-up appointment confirmed?: Yes Date of PCP follow-up appointment?: 04/28/23 Follow-up Provider: [facility] Follow-up appointment confirmed?: Yes Date of Specialist follow-up appointment?: 04/18/23 Follow-Up Specialty Provider:: cardiology,  she also has an [place] scheduled for tomorow, 04/15/2023.   referrals have been made to outpatient  PT /ST as well as Neurology Do you need transportation to your follow-up appointment?: Yes Transportation Need Intervention Addressed By:: Other: (Provided patient and [name] with the phone number for [facility] Medicaid transportation.  [name] stated he will accompany her to her appointments.) Do you understand care options if your condition(s) worsen?: Yes-patient verbalized understanding    SIGNATURE Robyne Peers, RN

## 2023-04-14 NOTE — Progress Notes (Signed)
Cardiology Clinic Note   Patient Name: Chelsea Branch Date of Encounter: 04/14/2023  Primary Care Provider:  Storm Frisk, MD Primary Cardiologist:  Sherryl Manges, MD  Patient Profile    Chelsea Branch 57 year old female presents the clinic today for follow-up evaluation of her chronic combined systolic and diastolic CHF.  Past Medical History    Past Medical History:  Diagnosis Date   Abnormal thyroid blood test 03/15/2017   Abnormal uterine bleeding    Alopecia    Anemia    Angina pectoris with normal coronary arteriogram (HCC) 2017   Had + Troponin c/w ? 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 iniatially ?2/2 peripartum in 1994 - improved by 2008 then worsening EF in 2011 back down to EF 25-30%. b. echo 01/21/14 showed mod LVH, [name]; 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   Peripartum cardiomyopathy 1994   Sleep apnea 2015   CPAP 12/2013   Stroke (HCC) 2011   Systolic and diastolic CHF, acute on chronic (HCC) 06/14/2015   Tobacco abuse    Past Surgical History:  Procedure Laterality Date   CARDIAC CATHETERIZATION N/A 06/16/2015   Procedure: Left Heart Cath and Coronary Angiography;  Surgeon: Corky Crafts, MD;  Location: [facility] INVASIVE CV LAB;  Service: Cardiovascular;  Laterality: N/A;   CESAREAN SECTION  1992  1994   LOOP RECORDER IMPLANT  ~ 2000   TIBIAL TUBERCLERPLASTY     TUBAL LIGATION  1994    Allergies  Allergies  Allergen Reactions   Ace Inhibitors Cough     History of Present Illness    Chelsea Branch has a PMH of CVA, HTN, HLD, NIDCM (presumed peripartum, chronic CHF) and chronic combined systolic and diastolic CHF.  She had an NSTEMI in 2017.  She was noted to have nonobstructive coronary disease per cath.  Her PMH also includes OSA on CPAP.  She was seen in follow-up by EP on 10/04/2022.  During that time she reported fatigue and malaise for around 6-8 weeks.  She also had associated general body aches x 1 month.  She denied acute illnesses or insect bites.  She reported that she had not been seen by other providers for her concerns.  She was taking her medications as directed.  She did note some relief with Tylenol.  She denied chest pain, dyspnea, syncope, weight gain, and early satiety.  Her blood pressure was 138/88.  Her volume status was stable.  Follow-up was planned in 6 months with Dr. Graciela Husbands.  She was NYHA class II-3.  She was admitted to the hospital 04/11/2023 and discharged on 04/13/2023.  She presented to the emergency department and reported a change in her mental status.  She noted headache, dry mouth, blurred vision while at work.  She was able to describe all of her symptoms while in triage with no aphasia or dysarthria.  Her vision was improving.  She was taken for CTA/CT which was negative.  At 1427  on 04/11/2023 she had a change in her neurological status.  She was noted to have expressive aphasia right upper arm weakness, confusion, and receptive as well as expressive aphasia.  She was diagnosed with acute infarct in the right submental lobe, posterior aspect of left insula and left cerebellar hemisphere.  She received thrombin therapy.  TNK was given at 1502.  MRI showed acute infarcts of her right occipital lobe, posterior aspect of the left insulae and left cerebellar hemisphere.  Echocardiogram showed an LVEF of 30-35%, moderately decreased LVH, mild concentric LVH, G3 DD, and severely dilated left atria.  A TEE was ordered.   However, patient ate prior to procedure and she was encouraged to have TEE done on 04/18/2023.  Her urine drug screen was negative.  She was started on aspirin and Plavix with a plan to continue aspirin alone after 3 weeks.  Her potassium was noted to be 3.1.  She received supplemental potassium.  Her blood pressure was noted to be 114/100.  Outpatient physical therapy and speech therapy were recommended.  She presents to the clinic today for follow-up evaluation and states her stroke has affected her speech.  She reports that she wants to help with cooking Thanksgiving dinner.  She feels that she is ready to get back to work.  We reviewed her recent hospitalization and her stroke.  She expressed understanding.  We reviewed the importance of completing physical therapy and speech therapy.  We reviewed her upcoming appointments and need for transesophageal echocardiogram.  She expressed understanding.  I will order TEE and have her follow-up with Dr. Graciela Husbands as scheduled.  Today she denies chest pain, shortness of breath, lower extremity edema, fatigue, palpitations, melena, hematuria, hemoptysis, diaphoresis, weakness, presyncope, syncope, orthopnea, and PND.   Home Medications    Prior to Admission medications   Medication Sig Start Date End Date Taking? Authorizing Provider  acetaminophen (TYLENOL) 500 MG tablet Take 1,000 mg by mouth as needed for mild pain (pain score 1-3) or moderate pain (pain score 4-6).    [provider]  albuterol (VENTOLIN HFA) 108 (90 Base) MCG/ACT inhaler Inhale 2 puffs into the lungs every 4 (four) hours as needed for wheezing or shortness of breath. 01/12/23   Storm Frisk, MD  aspirin 81 MG chewable tablet Chew 1 tablet (81 mg total) by mouth daily. 04/14/23   Peterson Ao, MD  atorvastatin (LIPITOR) 40 MG tablet Take 1 tablet (40 mg total) by mouth daily. 04/14/23   Peterson Ao, MD  clopidogrel (PLAVIX) 75 MG tablet Take 1 tablet (75 mg total) by  mouth daily for 20 days. 04/14/23 05/04/23  Peterson Ao, MD  fluticasone (FLONASE) 50 MCG/ACT nasal spray Place 2 sprays into both nostrils daily. 04/10/23   Rising, Lurena Joiner, PA-C  metoprolol succinate (TOPROL-XL) 50 MG 24 hr tablet Take 1 tablet (50 mg total) by mouth daily. Please keep upcoming appointment in April 2024 for future refills. Thank you Patient taking differently: Take 75 mg by mouth daily. 01/12/23   Storm Frisk, MD  Polyethylene Glycol 4500 POWD 17 g by Does not apply route daily at 6 (six) AM. Dissolve 1 capful of powder in one 8 ounce glass of water. Repeat daily or as needed to soften stool Patient taking differently: 17 g by Does not apply route daily as needed (constipation). 04/10/23   Rising, Lurena Joiner, PA-C  potassium chloride SA (KLOR-CON M20) 20 MEQ tablet TAKE 2 TABLETS IN THE MORNING AND 1 TABLET IN THE EVENING AS  DIRECTED Patient taking differently: Take 40 mEq by mouth daily. 01/12/23   Storm Frisk, MD  sacubitril-valsartan (ENTRESTO) 24-26 MG Take 1 tablet by mouth 2 (two) times daily. Patient taking differently: Take 1 tablet by mouth daily. 01/12/23   Storm Frisk, MD  spironolactone (ALDACTONE) 25 MG tablet Take 0.5 tablets (12.5 mg total) by mouth daily. 01/12/23   Storm Frisk, MD  torsemide (DEMADEX) 20 MG tablet Take 1 tablet (20 mg total) by mouth daily. 01/12/23   Storm Frisk, MD  DULoxetine (CYMBALTA) 30 MG capsule Take 1 capsule (30 mg total) by mouth daily. 12/07/18 12/10/19  [name], MD  famotidine (PEPCID) 20 MG tablet Take 1 tablet (20 mg total) 2 (two) times daily by mouth. 04/10/17 11/03/19  Elisha Ponder, PA-C    Family History    Family History  Problem Relation Age of Onset   Cancer Maternal Grandmother        uterine   Hypertension Sister    Healthy Mother    Other Neg Hx    Heart disease Neg Hx    She indicated that her mother is alive. She indicated that her father is deceased. She indicated that her  sister is alive. She indicated that her maternal grandmother is deceased. She indicated that her maternal grandfather is deceased. She indicated that her paternal grandmother is deceased. She indicated that her paternal grandfather is deceased. She indicated that the status of her neg hx is unknown.  Social History    Social History   Socioeconomic History   Marital status: Single    Spouse name: Not on file   Number of children: 3   Years of education: 12   Highest education level: Not on file  Occupational History   Occupation: unemployed  Tobacco Use   Smoking status: Former    Current packs/day: 0.00    Average packs/day: 0.1 packs/day for 30.0 years (3.0 ttl pk-yrs)    Types: Cigarettes    Start date: 06/02/1985    Quit date: 06/03/2015    Years since quitting: 7.8   Smokeless tobacco: Never   Tobacco comments:    Pt. stated she stopped smoking a year ago. 09/29/2018  Vaping Use   Vaping status: Never Used  Substance and Sexual Activity   Alcohol use: No    Alcohol/week: 0.0 standard drinks of alcohol   Drug use: No   Sexual activity: Yes    Birth control/protection: I.U.D.    Comment: fibroids  Other Topics Concern   Not on file  Social History Narrative   Lives at home with 58 yo twins ([place] and [place])   76 yo son lives outside the home   58 yo granddaughter    Social Determinants of Health   Financial Resource Strain: High Risk (03/19/2023)   Overall Financial Resource Strain (CARDIA)    Difficulty of Paying Living Expenses: Very hard  Food Insecurity: Food Insecurity Present (03/19/2023)   Hunger Vital Sign    Worried About Running Out of Food in the Last Year: Sometimes true    Ran Out of Food in the Last Year: Sometimes true  Transportation Needs: Unmet Transportation Needs (03/19/2023)   PRAPARE - Administrator, Civil Service (Medical): Yes    Lack of Transportation (Non-Medical): Yes  Physical Activity: Not on file  Stress: Stress Concern  Present (03/19/2023)   [employer] of Occupational Health - Occupational Stress Questionnaire    Feeling of Stress : Rather  much  Social Connections: Socially Isolated (03/19/2023)   Social Connection and Isolation Panel [NHANES]    Frequency of Communication with Friends and Family: Once a week    Frequency of Social Gatherings with Friends and Family: Once a week    Attends Religious Services: Never    Database administrator or Organizations: No    Attends Engineer, structural: Never    Marital Status: Living with partner  Intimate Partner Violence: Not on file     Review of Systems    General:  No chills, fever, night sweats or weight changes.  Cardiovascular:  No chest pain, dyspnea on exertion, edema, orthopnea, palpitations, paroxysmal nocturnal dyspnea. Dermatological: No rash, lesions/masses Respiratory: No cough, dyspnea Urologic: No hematuria, dysuria Abdominal:   No nausea, vomiting, diarrhea, bright red blood per rectum, melena, or hematemesis Neurologic:  No visual changes, wkns, changes in mental status. All other systems reviewed and are otherwise negative except as noted above.  Physical Exam    VS:  There were no vitals taken for this visit. , BMI There is no height or weight on file to calculate BMI. GEN: Well nourished, well developed, in no acute distress. HEENT: normal. Neck: Supple, no JVD, carotid bruits, or masses. Cardiac: RRR, no murmurs, rubs, or gallops. No clubbing, cyanosis, edema.  Radials/DP/PT 2+ and equal bilaterally.  Respiratory:  Respirations regular and unlabored, clear to auscultation bilaterally. GI: Soft, nontender, nondistended, BS + x 4. MS: no deformity or atrophy. Skin: warm and dry, no rash. Neuro:  Strength and sensation are intact. Psych: Normal affect.  Accessory Clinical Findings    Recent Labs: 01/12/2023: TSH 0.384 04/11/2023: ALT 25; BUN 12; Creatinine, Ser 1.10; Hemoglobin 16.0; Platelets 206; Potassium  3.0; Sodium 139   Recent Lipid Panel    Component Value Date/Time   CHOL 114 04/12/2023 0727   CHOL 150 07/21/2021 1039   TRIG 58 04/12/2023 0727   HDL 33 (L) 04/12/2023 0727   HDL 42 07/21/2021 1039   CHOLHDL 3.5 04/12/2023 0727   VLDL 12 04/12/2023 0727   LDLCALC 69 04/12/2023 0727   LDLCALC 85 07/21/2021 1039         ECG personally reviewed by me today- None today.  TTE 04/12/2023  IMPRESSIONS     1. Left ventricular ejection fraction, by estimation, is 30 to 35%. Left  ventricular ejection fraction by 3D volume is 33 %. The left ventricle has  moderately decreased function. The left ventricle demonstrates global  hypokinesis. The left ventricular  internal cavity size was severely dilated. There is mild concentric left  ventricular hypertrophy. Left ventricular diastolic parameters are  consistent with Grade III diastolic dysfunction (restrictive). Elevated  left ventricular end-diastolic pressure.   2. Right ventricular systolic function is normal. The right ventricular  size is normal. Tricuspid regurgitation signal is inadequate for assessing  PA pressure.   3. Left atrial size was severely dilated.   4. The mitral valve is normal in structure. Moderate mitral valve  regurgitation. No evidence of mitral stenosis.   5. The aortic valve is tricuspid. Aortic valve regurgitation is not  visualized. Aortic valve sclerosis/calcification is present, without any  evidence of aortic stenosis. Aortic valve area, by VTI measures 2.32 cm.  Aortic valve mean gradient measures  4.0 mmHg. Aortic valve Vmax measures 1.33 m/s.   6. Aortic dilatation noted. There is mild dilatation of the ascending  aorta, measuring 38 mm.   7. The inferior vena cava is normal in size  with greater than 50%  respiratory variability, suggesting right atrial pressure of 3 mmHg.   8. Compared to prior echo, LVF has declined.   Comparison(s): EF 60-65%.   FINDINGS   Left Ventricle: Left  ventricular ejection fraction, by estimation, is 30  to 35%. Left ventricular ejection fraction by 3D volume is 33 %. The left  ventricle has moderately decreased function. The left ventricle  demonstrates global hypokinesis. The left  ventricular internal cavity size was severely dilated. There is mild  concentric left ventricular hypertrophy. Left ventricular diastolic  parameters are consistent with Grade III diastolic dysfunction  (restrictive). Elevated left ventricular end-diastolic   pressure.   Right Ventricle: The right ventricular size is normal. No increase in  right ventricular wall thickness. Right ventricular systolic function is  normal. Tricuspid regurgitation signal is inadequate for assessing PA  pressure.   Left Atrium: Left atrial size was severely dilated.   Right Atrium: Right atrial size was normal in size.   Pericardium: There is no evidence of pericardial effusion.   Mitral Valve: The mitral valve is normal in structure. Moderate mitral  valve regurgitation. No evidence of mitral valve stenosis.   Tricuspid Valve: The tricuspid valve is normal in structure. Tricuspid  valve regurgitation is trivial. No evidence of tricuspid stenosis.   Aortic Valve: The aortic valve is tricuspid. Aortic valve regurgitation is  not visualized. Aortic valve sclerosis/calcification is present, without  any evidence of aortic stenosis. Aortic valve mean gradient measures 4.0  mmHg. Aortic valve peak gradient  measures 7.1 mmHg. Aortic valve area, by VTI measures 2.32 cm.   Pulmonic Valve: The pulmonic valve was normal in structure. Pulmonic valve  regurgitation is mild. No evidence of pulmonic stenosis.   Aorta: Aortic dilatation noted. There is mild dilatation of the ascending  aorta, measuring 38 mm.   Venous: The inferior vena cava is normal in size with greater than 50%  respiratory variability, suggesting right atrial pressure of 3 mmHg.   IAS/Shunts: No atrial  level shunt detected by color flow Doppler.       Assessment & Plan   1.  Chronic combined systolic and diastolic CHF,Nonischemic dilated cardiomyopathy -continues to work with speech therapy and physical therapy.  Echocardiogram during admission showed EF 30-35%.   TEE was ordered during admission but not completed because pt ate prior to testing, will reorder test. Continue metoprolol, potassium, Entresto, spironolactone, torsemide Heart healthy low-sodium diet Continue to increase physical activity Order TEE  Essential hypertension-BP today 122/80 . Maintain blood pressure log Continue current medical therapy  History of CVA-presented to emergency department on 04/11/2023 and was discharged on 04/13/2023.  Was diagnosed with acute infarcts in theright occipital lobe, posterior aspect of the left insulae and left cerebellar hemisphere.  She received TNK. Continue aspirin and Plavix x 3 weeks and then aspirin alone Following with neurology Continue physical therapy and speech therapy  Disposition: Follow-up with Dr. Graciela Husbands as scheduled   Thomasene Ripple. Brailynn Breth NP-C     04/14/2023, 6:41 AM [facility] [address] Office [phone] Fax [phone]    I spent 14 minutes examining this patient, reviewing medications, and using patient centered shared decision making involving her cardiac care.   I spent greater than 20 minutes reviewing her past medical history,  medications, and prior cardiac tests.

## 2023-04-15 ENCOUNTER — Ambulatory Visit (HOSPITAL_COMMUNITY): Admission: RE | Admit: 2023-04-15 | Payer: Medicaid Other | Source: Ambulatory Visit

## 2023-04-15 ENCOUNTER — Other Ambulatory Visit: Payer: Self-pay | Admitting: Primary Care

## 2023-04-15 DIAGNOSIS — Z760 Encounter for issue of repeat prescription: Secondary | ICD-10-CM

## 2023-04-15 LAB — CARDIOLIPIN ANTIBODIES, IGG, IGM, IGA
Anticardiolipin IgA: <9 U/mL (ref 0–11)
Anticardiolipin IgG: <9 [GPL'U]/mL (ref 0–14)
Anticardiolipin IgM: 14 [MPL'U]/mL — ABNORMAL HIGH (ref 0–12)

## 2023-04-18 ENCOUNTER — Encounter: Payer: Self-pay | Admitting: General Practice

## 2023-04-18 ENCOUNTER — Ambulatory Visit: Payer: Medicaid Other | Attending: General Practice | Admitting: General Practice

## 2023-04-18 VITALS — BP 122/80 | HR 70 | Ht 69.0 in | Wt 192.0 lb

## 2023-04-18 DIAGNOSIS — I10 Essential (primary) hypertension: Secondary | ICD-10-CM

## 2023-04-18 DIAGNOSIS — I420 Dilated cardiomyopathy: Secondary | ICD-10-CM

## 2023-04-18 DIAGNOSIS — I5043 Acute on chronic combined systolic (congestive) and diastolic (congestive) heart failure: Secondary | ICD-10-CM

## 2023-04-18 DIAGNOSIS — I5032 Chronic diastolic (congestive) heart failure: Secondary | ICD-10-CM

## 2023-04-18 DIAGNOSIS — Z8673 Personal history of transient ischemic attack (TIA), and cerebral infarction without residual deficits: Secondary | ICD-10-CM | POA: Diagnosis not present

## 2023-04-18 NOTE — Patient Instructions (Signed)
Medication Instructions:  The current medical regimen is effective;  continue present plan and medications as directed. Please refer to the Current Medication list given to you today.  *If you need a refill on your cardiac medications before your next appointment, please call your pharmacy*  Lab Work: NONE  Testing/Procedures: SEE BELOW  Follow-Up: At [facility], you and your health needs are our priority.  As part of our continuing mission to provide you with exceptional heart care, we have created designated Provider Care Teams.  These Care Teams include your primary Cardiologist (physician) and Advanced Practice Providers (APPs -  Physician Assistants and Nurse Practitioners) who all work together to provide you with the care you need, when you need it.  We recommend signing up for the patient portal called "MyChart".  Sign up information is provided on this After Visit Summary.  MyChart is used to connect with patients for Virtual Visits (Telemedicine).  Patients are able to view lab/test results, encounter notes, upcoming appointments, etc.  Non-urgent messages can be sent to your provider as well.   To learn more about what you can do with MyChart, go to [email or web address].    Your next appointment:   AS SCHEDULED   Provider:   Sherryl Manges, MD         Dear Chelsea Branch  You are scheduled for a TEE (Transesophageal Echocardiogram) on Wednesday, November 27 with Dr. Carolan Clines.  Please arrive at the [facility] (Main Entrance A) at [facility]: [address] [place], [place] [address] at 8:30 AM (This time is 1.5 hour(s) before your procedure to ensure your preparation).   Free valet parking service is available. You will check in at ADMITTING.   *Please Note: You will receive a call the day before your procedure to confirm the appointment time. That time may have changed from the original time based on the schedule for that day.*    DIET:  Nothing to  eat or drink after midnight except a sip of water with medications (see medication instructions below)  MEDICATION INSTRUCTIONS: !!IF ANY NEW MEDICATIONS ARE STARTED AFTER TODAY, PLEASE NOTIFY YOUR PROVIDER AS SOON AS POSSIBLE!!  FYI: Medications such as Semaglutide (Ozempic, [place]), Tirzepatide (Mounjaro, Zepbound), Dulaglutide (Trulicity), etc ("GLP1 agonists") AND Canagliflozin (Invokana), Dapagliflozin (Farxiga), Empagliflozin (Jardiance), Ertugliflozin (Steglatro), Bexagliflozin [employer]) or any combination with one of these drugs such as Invokamet (Canagliflozin/Metformin), Synjardy (Empagliflozin/Metformin), etc ("SGLT2 inhibitors") must be held around the time of a procedure. This is not a comprehensive list of all of these drugs. Please review all of your medications and talk to your provider if you take any one of these. If you are not sure, ask your provider.  }HOLD YOUR TORSEMIDE AND SPIRO BEFORE TESTING AND TAKE AFTER YOUR TESTING IS COMPLETE   LABS: {NONE  FYI:  For your safety, and to allow [place] to monitor your vital signs accurately during the surgery/procedure we request: If you have artificial nails, gel coating, SNS etc, please have those removed prior to your surgery/procedure. Not having the nail coverings /polish removed may result in cancellation or delay of your surgery/procedure.  Your support person will be asked to wait in the waiting room during your procedure.  It is OK to have someone drop you off and come back when you are ready to be discharged.  You cannot drive after the procedure and will need someone to drive you home.  Bring your insurance cards.  *Special Note: Every effort is made to  have your procedure done on time. Occasionally there are emergencies that occur at the hospital that may cause delays. Please be patient if a delay does occur.

## 2023-04-19 NOTE — Progress Notes (Signed)
Spoke to pt and instructed them to come at 0800 and to be NPO after 0000.  Confirmed that pt will have a ride home and someone to stay with them for 24 hours after the procedure. Instructed patient to not wear any jewelry or lotion.

## 2023-04-20 ENCOUNTER — Ambulatory Visit (HOSPITAL_COMMUNITY): Payer: Medicaid Other | Admitting: Anesthesiology

## 2023-04-20 ENCOUNTER — Encounter (HOSPITAL_COMMUNITY): Payer: Self-pay | Admitting: Internal Medicine

## 2023-04-20 ENCOUNTER — Encounter (HOSPITAL_COMMUNITY)
Admission: RE | Disposition: A | Discharge status: Home / Self Care | Payer: Self-pay | Source: Home / Self Care | Attending: Internal Medicine

## 2023-04-20 ENCOUNTER — Other Ambulatory Visit: Payer: Self-pay

## 2023-04-20 ENCOUNTER — Telehealth: Payer: Self-pay

## 2023-04-20 ENCOUNTER — Ambulatory Visit (HOSPITAL_COMMUNITY)
Admission: RE | Admit: 2023-04-20 | Discharge: 2023-04-20 | Disposition: A | Discharge status: Home / Self Care | Payer: Medicaid Other | Source: Ambulatory Visit | Attending: Internal Medicine | Admitting: Internal Medicine

## 2023-04-20 ENCOUNTER — Ambulatory Visit (HOSPITAL_COMMUNITY)
Admission: RE | Admit: 2023-04-20 | Discharge: 2023-04-20 | Disposition: A | Discharge status: Home / Self Care | Payer: Medicaid Other | Attending: Internal Medicine | Admitting: Internal Medicine

## 2023-04-20 DIAGNOSIS — I639 Cerebral infarction, unspecified: Secondary | ICD-10-CM

## 2023-04-20 DIAGNOSIS — J449 Chronic obstructive pulmonary disease, unspecified: Secondary | ICD-10-CM | POA: Insufficient documentation

## 2023-04-20 DIAGNOSIS — I110 Hypertensive heart disease with heart failure: Secondary | ICD-10-CM | POA: Insufficient documentation

## 2023-04-20 DIAGNOSIS — I429 Cardiomyopathy, unspecified: Secondary | ICD-10-CM

## 2023-04-20 DIAGNOSIS — I420 Dilated cardiomyopathy: Secondary | ICD-10-CM | POA: Insufficient documentation

## 2023-04-20 DIAGNOSIS — Z7982 Long term (current) use of aspirin: Secondary | ICD-10-CM | POA: Diagnosis not present

## 2023-04-20 DIAGNOSIS — I252 Old myocardial infarction: Secondary | ICD-10-CM | POA: Diagnosis not present

## 2023-04-20 DIAGNOSIS — Z79899 Other long term (current) drug therapy: Secondary | ICD-10-CM | POA: Insufficient documentation

## 2023-04-20 DIAGNOSIS — Z87891 Personal history of nicotine dependence: Secondary | ICD-10-CM | POA: Diagnosis not present

## 2023-04-20 DIAGNOSIS — G4733 Obstructive sleep apnea (adult) (pediatric): Secondary | ICD-10-CM | POA: Insufficient documentation

## 2023-04-20 DIAGNOSIS — I2510 Atherosclerotic heart disease of native coronary artery without angina pectoris: Secondary | ICD-10-CM | POA: Insufficient documentation

## 2023-04-20 DIAGNOSIS — I5042 Chronic combined systolic (congestive) and diastolic (congestive) heart failure: Secondary | ICD-10-CM | POA: Diagnosis not present

## 2023-04-20 DIAGNOSIS — I5032 Chronic diastolic (congestive) heart failure: Secondary | ICD-10-CM

## 2023-04-20 DIAGNOSIS — E785 Hyperlipidemia, unspecified: Secondary | ICD-10-CM | POA: Diagnosis not present

## 2023-04-20 DIAGNOSIS — Z8673 Personal history of transient ischemic attack (TIA), and cerebral infarction without residual deficits: Secondary | ICD-10-CM | POA: Diagnosis not present

## 2023-04-20 HISTORY — PX: TRANSESOPHAGEAL ECHOCARDIOGRAM (CATH LAB): EP1270

## 2023-04-20 LAB — PREGNANCY, URINE: Preg Test, Ur: NEGATIVE

## 2023-04-20 LAB — POCT I-STAT, CHEM 8
BUN: 6 mg/dL (ref 6–20)
Calcium, Ion: 1.26 mmol/L (ref 1.15–1.40)
Chloride: 106 mmol/L (ref 98–111)
Creatinine, Ser: 0.8 mg/dL (ref 0.44–1.00)
Glucose, Bld: 99 mg/dL (ref 70–99)
HCT: 42 % (ref 36.0–46.0)
Hemoglobin: 14.3 g/dL (ref 12.0–15.0)
Potassium: 4.1 mmol/L (ref 3.5–5.1)
Sodium: 142 mmol/L (ref 135–145)
TCO2: 24 mmol/L (ref 22–32)

## 2023-04-20 SURGERY — TRANSESOPHAGEAL ECHOCARDIOGRAM (TEE) (CATHLAB)
Anesthesia: Monitor Anesthesia Care

## 2023-04-20 MED ORDER — PROPOFOL 10 MG/ML IV BOLUS
INTRAVENOUS | Status: DC | PRN
  Administered 2023-04-20: 20 mg via INTRAVENOUS
  Administered 2023-04-20: 40 mg via INTRAVENOUS

## 2023-04-20 MED ORDER — SODIUM CHLORIDE 0.9% FLUSH: 10.0000 mL | Freq: Two times a day (BID) | INTRAVENOUS | Status: DC

## 2023-04-20 MED ORDER — SODIUM CHLORIDE 0.9 % IV SOLN: INTRAVENOUS | Status: DC | PRN

## 2023-04-20 MED ORDER — LIDOCAINE 2% (20 MG/ML) 5 ML SYRINGE
INTRAMUSCULAR | Status: DC | PRN
  Administered 2023-04-20: 60 mg via INTRAVENOUS

## 2023-04-20 NOTE — Telephone Encounter (Signed)
Will call Monday

## 2023-04-20 NOTE — Interval H&P Note (Deleted)
History and Physical Interval Note:  04/20/2023 9:49 AM  Chelsea Branch  has presented today for surgery, with the diagnosis of CARDIOMYOPATHY CONGESTIVE HEART FAILURE.  The various methods of treatment have been discussed with the patient and family. After consideration of risks, benefits and other options for treatment, the patient has consented to  Procedure(s): TRANSESOPHAGEAL ECHOCARDIOGRAM (N/A) as a surgical intervention.  The patient's history has been reviewed, patient examined, no change in status, stable for surgery.  I have reviewed the patient's chart and labs.  Questions were answered to the patient's satisfaction.     [name]

## 2023-04-20 NOTE — Discharge Instructions (Signed)

## 2023-04-20 NOTE — CV Procedure (Signed)
INDICATIONS: stroke  PROCEDURE:   Informed consent was obtained prior to the procedure. The risks, benefits and alternatives for the procedure were discussed and the patient comprehended these risks.  Risks include, but are not limited to, cough, sore throat, vomiting, nausea, somnolence, esophageal and stomach trauma or perforation, bleeding, low blood pressure, aspiration, pneumonia, infection, trauma to the teeth and death.    After a procedural time-out, the oropharynx was anesthetized with 20% benzocaine spray.   During this procedure the patient was administered propofol to achieve and maintain moderate conscious sedation.  The patient's heart rate, blood pressure, and oxygen saturationweare monitored continuously during the procedure. The period of conscious sedation was 15 minutes, of which I was present face-to-face 100% of this time.  The transesophageal probe was inserted in the esophagus and stomach without difficulty and multiple views were obtained.  The patient was kept under observation until the patient left the procedure room.  The patient left the procedure room in stable condition.   Agitated microbubble saline contrast was not administered.  COMPLICATIONS:    Patient went apneic during the procedure and quickly desatted. The procedure was aborted  FINDINGS:  NA  RECOMMENDATIONS:   Consider focused limited TTE with bubble study   Time Spent Directly with the Patient:  15 minutes   Chelsea Branch 04/20/2023, 9:51 AM

## 2023-04-20 NOTE — Interval H&P Note (Signed)
History and Physical Interval Note:  04/20/2023 9:06 AM  Chelsea Branch  has presented today for surgery, with the diagnosis of CARDIOMYOPATHY CONGESTIVE HEART FAILURE.  The various methods of treatment have been discussed with the patient and family. After consideration of risks, benefits and other options for treatment, the patient has consented to  Procedure(s): TRANSESOPHAGEAL ECHOCARDIOGRAM (N/A) as a surgical intervention.  The patient's history has been reviewed, patient examined, no change in status, stable for surgery.  I have reviewed the patient's chart and labs.  Questions were answered to the patient's satisfaction.     [name]

## 2023-04-20 NOTE — Anesthesia Preprocedure Evaluation (Signed)
Anesthesia Evaluation  Patient identified by MRN, date of birth, ID band Patient awake    Reviewed: Allergy & Precautions, H&P , NPO status , Patient's Chart, lab work & pertinent test results, reviewed documented beta blocker date and time   Airway Mallampati: III  TM Distance: >3 FB Neck ROM: Full    Dental no notable dental hx. (+) Teeth Intact, Dental Advisory Given   Pulmonary sleep apnea , COPD,  COPD inhaler, former smoker   Pulmonary exam normal breath sounds clear to auscultation       Cardiovascular hypertension, Pt. on medications and Pt. on home beta blockers + Past MI and +CHF   Rhythm:Regular Rate:Normal     Neuro/Psych CVA, Residual Symptoms  negative psych ROS   GI/Hepatic negative GI ROS, Neg liver ROS,,,  Endo/Other  negative endocrine ROS    Renal/GU negative Renal ROS  negative genitourinary   Musculoskeletal  (+) Arthritis , Osteoarthritis,    Abdominal   Peds  Hematology  (+) Blood dyscrasia, anemia   Anesthesia Other Findings   Reproductive/Obstetrics negative OB ROS                             Anesthesia Physical Anesthesia Plan  ASA: 4  Anesthesia Plan: MAC   Post-op Pain Management: Minimal or no pain anticipated   Induction: Intravenous  PONV Risk Score and Plan: 2 and Propofol infusion  Airway Management Planned: Natural Airway and Simple Face Mask  Additional Equipment:   Intra-op Plan:   Post-operative Plan:   Informed Consent: I have reviewed the patients History and Physical, chart, labs and discussed the procedure including the risks, benefits and alternatives for the proposed anesthesia with the patient or authorized representative who has indicated his/her understanding and acceptance.     Dental advisory given  Plan Discussed with: CRNA  Anesthesia Plan Comments:        Anesthesia Quick Evaluation

## 2023-04-20 NOTE — Telephone Encounter (Signed)
Left message to call back  

## 2023-04-20 NOTE — Anesthesia Postprocedure Evaluation (Signed)
Anesthesia Post Note  Patient: Chelsea Branch  Procedure(s) Performed: TRANSESOPHAGEAL ECHOCARDIOGRAM     Patient location during evaluation: Cath Lab Anesthesia Type: MAC Level of consciousness: awake and alert Pain management: pain level controlled Vital Signs Assessment: post-procedure vital signs reviewed and stable Respiratory status: spontaneous breathing, nonlabored ventilation and respiratory function stable Cardiovascular status: stable and blood pressure returned to baseline Postop Assessment: no apparent nausea or vomiting Anesthetic complications: no  There were no known notable events for this encounter.  Last Vitals:  Vitals:   04/20/23 1020 04/20/23 1030  BP: 114/78 127/87  Pulse: 68 60  Resp: 16 14  Temp:    SpO2: 96% 97%    Last Pain:  Vitals:   04/20/23 1014  TempSrc: Temporal  PainSc: 0-No pain                 Soundra Lampley,W. EDMOND

## 2023-04-20 NOTE — Transfer of Care (Signed)
Immediate Anesthesia Transfer of Care Note  Patient: Chelsea Branch  Procedure(s) Performed: TRANSESOPHAGEAL ECHOCARDIOGRAM  Patient Location: PACU and cath lab  Anesthesia Type:MAC  Level of Consciousness: awake, alert , oriented, and patient cooperative  Airway & Oxygen Therapy: Patient Spontanous Breathing  Post-op Assessment: Report given to RN and Post -op Vital signs reviewed and stable  Post vital signs: stable  Last Vitals:  Vitals Value Taken Time  BP    Temp    Pulse    Resp    SpO2      Last Pain:  Vitals:   04/20/23 0740  TempSrc: Temporal         Complications: There were no known notable events for this encounter.

## 2023-04-20 NOTE — Telephone Encounter (Signed)
-----   Message from Ronney Asters sent at 04/20/2023 10:24 AM EST ----- Regarding: RE: TEE Marcelino Duster, please order limited transthoracic echocardiogram with bubble study.  She was not able to do TEE which we previously ordered.  Thank you for your help.  Thomasene Ripple. Cleaver NP-C  [image]   04/20/2023, 10:25 AM [facility] [address] Office [phone] Fax [phone] ----- Message ----- From: [name], MD Sent: 04/20/2023  10:01 AM EST To: Ronney Asters, NP Subject: TEE                                            Marshell Garfinkel, unfortunately Mrs. Rippeon was apneic and desatted prior to her procedure. I had to abort the case. You can consider focused limited TTE with bubble study.  Take Care. -MB

## 2023-04-20 NOTE — Telephone Encounter (Signed)
Pt notified that she needs to have Bubble study, she will await call from scheduling. Order entered

## 2023-04-24 ENCOUNTER — Other Ambulatory Visit: Payer: Self-pay | Admitting: Neurology

## 2023-04-24 DIAGNOSIS — M318 Other specified necrotizing vasculopathies: Secondary | ICD-10-CM

## 2023-04-24 NOTE — Progress Notes (Signed)
Kindly inform the patient that some of the lab works which were drawn during her recent hospital admission and were not back have not come back.  The lab work for inflammatory markers suggest that she may have a lupus or vasculitic condition.  I am referring her to rheumatologist for further evaluation and treatment for this.

## 2023-04-25 ENCOUNTER — Telehealth: Payer: Self-pay

## 2023-04-25 ENCOUNTER — Telehealth: Payer: Self-pay | Admitting: Neurology

## 2023-04-25 NOTE — Telephone Encounter (Signed)
Referral for Rheumatology fax to [facility]. Phone:[phone], Fax: [phone].

## 2023-04-25 NOTE — Patient Outreach (Signed)
  Emmi Stroke Care Coordination Follow Up  04/25/2023 Name:  [name] MRN:  [id number] DOB:  12-26-1965  Subjective: Chelsea Branch is a 56 y.o. year old female who is a primary care patient of Storm Frisk, MD An Emmi alert was received indicating patient responded to questions: Smoked or been around smoke?Marland Kitchen Incoming call from patient returning RN CM call. She voices she is doing oka. No acute sxs or issues.   Reviewed and addressed red alert. Patient voices that she has not had a cigarette since d/c home. She is "chewing gum and peppermint"-which she voices is working. Denies being around second hand smoke. Praised pt for her efforts and encouraged her to keep it up. She voices appetite good. No issues with elimination. States she is a "little weak" at times when walking. She has appt with outpt therapy this week. She goes for PCP appt on 04/28/23 and neuro appt on 05/08/23. She reports she will get her family to take her o call and arrange transportation. Discussed with pt that agency requires advanced notice and to go ahead and confirm rather or not family taking her and if not call agency. He voiced understanding.  Denies any RN CM needs or concerns at this time.   Care Coordination Interventions:  Yes, provided   TOC interventions discussed/reviewed: -Discussed/reviewed insurance/health plans benefits -Doctor visit discussed/reviewed -PCP -Doctor visits discussed/reviewed-Specialist -Provided verbal education HK:[id number] cessation  -Disease mgmt. discussed/reviewed: HTN-pt does not monitor BP or wgt in the home-doesn't have devices-encouraged to check with insurance provider, post stroke mgmt  Follow up plan: Advised patient that they would continue to get automated EMMI-Stroke post discharge calls to assess how they are doing following recent hospitalization and will receive a call from a nurse if any of their responses were abnormal. Patient voiced understanding and was appreciative of  f/u call.   Encounter Outcome:  Patient Visit Completed   Antionette Fairy, RN,BSN,CCM RN Care Manager Transitions of Care  [place]  Direct Phone: [phone] Toll Free: [phone] Fax: [phone]

## 2023-04-25 NOTE — Patient Outreach (Signed)
Received a red flag Emmi stroke notification. I have assigned Roshanda Florance, RN to call for follow up and determine if there are any Case Management needs.    Laura Greeson, CBCS, CMAA [facility] Care Management Assistant [facility] Care Management [phone]  

## 2023-04-25 NOTE — Telephone Encounter (Signed)
Contacted pt, informed her that some of the lab works which were drawn during her recent hospital admission and were not back have not come back. The lab work for inflammatory markers suggest that she may have a lupus or vasculitic condition.  Informed Dr Pearlean Brownie is referring her to rheumatologist for further evaluation and treatment for this.  Advised to call the office back with any questions or concerns as she had none at this time.  Patient verbally understood and was appreciative.

## 2023-04-25 NOTE — Patient Outreach (Signed)
  Emmi Stroke Care Coordination Follow Up  04/25/2023 Name:  Chelsea Branch MRN:  [id number] DOB:  1966/03/04  Subjective: Chelsea Branch is a 58 y.o. year old female who is a primary care patient of Storm Frisk, MD An Emmi alert was received indicating patient responded to questions: Smoked or been around smoke?. I reached out by phone to follow up on the alert. No answer at present. RN CM left HIPAA compliant voicemail along with contact info.   Care Coordination Interventions:  No, not indicated   Follow up plan:  RN CM will make outreach attempt again if no return call from patient.    Encounter Outcome:  No Answer    Antionette Fairy, RN,BSN,CCM RN Care Manager Transitions of Care  [place]  Direct Phone: [phone] Toll Free: [phone] Fax: [phone]

## 2023-04-25 NOTE — Telephone Encounter (Signed)
-----   Message from Delia Heady sent at 04/24/2023  5:13 PM EST ----- Kindly inform the patient that some of the lab works which were drawn during her recent hospital admission and were not back have not come back.  The lab work for inflammatory markers suggest that she may have a lupus or vasculitic condition.  I am referring her to rheumatologist for further evaluation and treatment for this.

## 2023-04-27 ENCOUNTER — Telehealth: Payer: Self-pay | Admitting: Internal Medicine

## 2023-04-27 ENCOUNTER — Ambulatory Visit: Payer: Medicaid Other | Attending: Neurology

## 2023-04-27 DIAGNOSIS — I639 Cerebral infarction, unspecified: Secondary | ICD-10-CM | POA: Insufficient documentation

## 2023-04-27 DIAGNOSIS — M6281 Muscle weakness (generalized): Secondary | ICD-10-CM | POA: Diagnosis present

## 2023-04-27 NOTE — Therapy (Addendum)
OUTPATIENT PHYSICAL THERAPY NEURO EVALUATION   Patient Name: Chelsea Branch MRN: [id number] DOB:11-02-1965, 57 y.o., female Today's Date: 04/27/2023   PCP: Storm Frisk, MD  REFERRING PROVIDER: Micki Riley, MD  END OF SESSION:  PT End of Session - 04/27/23 1527     Visit Number 1    Number of Visits 1    Authorization Type [place] Medicaid    PT Start Time 1530    PT Stop Time 1558    PT Time Calculation (min) 28 min    Activity Tolerance Patient tolerated treatment well             Past Medical History:  Diagnosis Date   Abnormal thyroid blood test 03/15/2017   Abnormal uterine bleeding    Alopecia    Anemia    Angina pectoris with normal coronary arteriogram (HCC) 2017   Had + Troponin c/w ? 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 iniatially ?2/2 peripartum in 1994 - improved by 2008 then worsening EF in 2011 back down to EF 25-30%. b. echo 01/21/14 showed mod LVH, [name]; 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   Peripartum cardiomyopathy 1994   Sleep apnea 2015   CPAP 12/2013   Stroke (HCC) 2011   Systolic and diastolic CHF, acute on chronic (HCC) 06/14/2015   Tobacco abuse    Past Surgical History:  Procedure Laterality Date   CARDIAC CATHETERIZATION N/A 06/16/2015   Procedure: Left Heart Cath and Coronary Angiography;  Surgeon: Corky Crafts, MD;  Location: [facility] INVASIVE CV LAB;  Service: Cardiovascular;  Laterality: N/A;   CESAREAN SECTION  1992  1994   LOOP RECORDER  IMPLANT  ~ 2000   TIBIAL TUBERCLERPLASTY     TRANSESOPHAGEAL ECHOCARDIOGRAM (CATH LAB) N/A 04/20/2023   Procedure: TRANSESOPHAGEAL ECHOCARDIOGRAM;  Surgeon: [name], MD;  Location: [facility] INVASIVE CV LAB;  Service: Cardiovascular;  Laterality: N/A;   TUBAL LIGATION  1994   Patient Active Problem List   Diagnosis Date Noted   Cerebellar stroke, acute (HCC) 04/13/2023   Acute cerebral infarction (HCC) 04/11/2023   Liletta IUD (intrauterine device) in place since 12/15/2015 04/06/2023   Bacterial vaginitis 01/13/2023   External otitis of both ears due to fungus 10/07/2022   Chronic pain syndrome 12/28/2021   Primary osteoarthritis of both knees 12/28/2021   Spinal stenosis of lumbar region 07/21/2021   Lumbar radiculitis 07/21/2021   COPD (chronic obstructive pulmonary disease) (HCC) 07/21/2021   Mechanical complication due to intrauterine contraceptive device 07/21/2021   Arthropathy of lumbar facet joint 03/14/2019   Degenerative disc disease at L5-S1 level 10/10/2018   Low TSH level 04/26/2016   Abnormal uterine bleeding 04/06/2016   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   History of non-ST elevation myocardial infarction (NSTEMI) 06/14/2015   Homelessness 09/16/2014   Vitamin D deficiency 01/29/2014   OSA (obstructive sleep apnea) 01/08/2014   Chronic combined systolic and diastolic CHF (congestive heart failure) (HCC) 08/14/2013   Essential  hypertension    Fibroids 08/09/2012   Menorrhagia 05/08/2012   Female pattern hair loss    HLD (hyperlipidemia) 06/08/2010   CEREBRAL ANEURYSM 06/08/2010   History of cardiovascular disorder 06/08/2010    ONSET DATE: 04/13/23  REFERRING DIAG:  Diagnosis  I63.9 (ICD-10-CM) - Cerebellar stroke, acute (HCC)  I63.9 (ICD-10-CM) - Acute cerebral infarction (HCC)    THERAPY DIAG:  Muscle weakness (generalized)  Rationale for Evaluation and Treatment: Rehabilitation  SUBJECTIVE:                                                                                                                                                                                              SUBJECTIVE STATEMENT: Patient reported to physical therapy evaluation alone and with no AD. She reported she wants to work on her balance and walking. Recent CVA on 04/11/23. Patient is more concerned about starting speech therapy. Left arm has been hurting some since the stroke. On medical leave from work and no plans at this time for return.   Pt accompanied by: self  PERTINENT HISTORY: MI, anemia, DM, asthma, sickle cell anemia, stroke, CHF, hypertension, hyperlipidemia   PAIN:  Are you having pain? No  PRECAUTIONS: Fall  RED FLAGS: None   WEIGHT BEARING RESTRICTIONS: No  FALLS: Has patient fallen in last 6 months? No  LIVING ENVIRONMENT: Lives with: lives with their family Lives in: House/apartment Stairs: Yes: External: 4 steps; on right going up Has following equipment at home: Single point cane  PLOF: Independent  PATIENT GOALS: patient inquired about what PT does and is more interested in speech at this time   OBJECTIVE:  Note: Objective measures were completed at Evaluation unless otherwise noted.  DIAGNOSTIC FINDINGS: 04/12/23 Brain MRI  IMPRESSION: Acute infarcts in the right occipital lobe, along the posterior aspect of the left insula, and in the left cerebellar hemisphere with additional tiny foci of possible acute infarction in the left parietal lobe. Findings are suspicious for cardioembolic etiology. Trace petechial hemorrhage in the medial aspect of the right occipital lobe and posterior left insula. No significant mass effect.  COGNITION: Overall cognitive status: Within functional limits for tasks assessed   SENSATION: WFL  COORDINATION: WFL   POSTURE: rounded shoulders, forward head, and increased thoracic kyphosis  LOWER EXTREMITY ROM:     WFL   LOWER EXTREMITY MMT:    MMT Right Eval  Left Eval  Hip flexion 4 3  Hip extension    Hip abduction 5 5  Hip adduction 5 5  Hip internal rotation    Hip external rotation    Knee flexion 5 5  Knee extension 5 5  Ankle dorsiflexion 5 4  Ankle plantarflexion    Ankle inversion    Ankle eversion    (Blank rows = not tested)  BED MOBILITY:  Independent    GAIT: Gait pattern: WFL Distance walked: clinic distances,  Assistive device utilized: None Level of assistance: Complete Independence Comments: able to carry jacket, purse, bag, no deviations   FUNCTIONAL TESTS:   5xSTS - 11.59 sec w/o UE support   10 MWT - 1.58m/s    TODAY'S TREATMENT:                                                                                                                               EVAL    PATIENT EDUCATION: Education details: PT exam findings Person educated: Patient Education method: Explanation Education comprehension: verbalized understanding   GOALS: Pt. Does not require skilled PT at this time   ASSESSMENT:  CLINICAL IMPRESSION: Patient is a 57 y.o. female who was seen today for physical therapy evaluation and treatment for gait and balance post CVA on 04/05/23. Patient is doing very well at this time. Bilat LE strength and coordination WFL.   5xSTS - 11.59 sec w/o UE support   10 MWT - 1.7m/s   5xSTS and scores are both within normal limits. Skilled PT not indicated at this time. Patient to schedule speech eval.   Educated patient on staying active.   CLINICAL DECISION MAKING: Stable/uncomplicated  EVALUATION COMPLEXITY: Low  PLAN:  PT FREQUENCY: one time visit  PT DURATION: other: one time visit    Nicko Daher, Student-PT Westley Foots, PT, DPT, CBIS  04/27/2023, 4:06 PM   For all possible CPT codes, reference the Planned Interventions line above.     Check all conditions that are expected to impact treatment: {Conditions expected to impact treatment:Neurological condition  and/or seizures   If treatment provided at initial evaluation, no treatment charged due to lack of authorization.

## 2023-04-27 NOTE — Telephone Encounter (Signed)
Received a FL-2 form from [facility].  Spoke with patient regarding coming to pay the $29 form completion fee and signing the release of information.  She said that she did not have the money to pay.  I asked Crystal Kem Kays if there was a process to waive the fee considering situations like Ms. Wesche and she said that there was not.  I left message for patient to call me back so I can find out what she wants me to do with this form.

## 2023-04-27 NOTE — Addendum Note (Signed)
Addended by: Merry Lofty A on: 04/27/2023 04:08 PM   Modules accepted: Orders

## 2023-04-28 ENCOUNTER — Ambulatory Visit: Payer: Medicaid Other | Attending: Family Medicine | Admitting: Family Medicine

## 2023-04-28 ENCOUNTER — Telehealth: Payer: Self-pay

## 2023-04-28 ENCOUNTER — Other Ambulatory Visit: Payer: Self-pay

## 2023-04-28 ENCOUNTER — Encounter: Payer: Self-pay | Admitting: Family Medicine

## 2023-04-28 VITALS — BP 128/81 | HR 75 | Ht 68.0 in | Wt 195.0 lb

## 2023-04-28 DIAGNOSIS — I420 Dilated cardiomyopathy: Secondary | ICD-10-CM

## 2023-04-28 DIAGNOSIS — Z8673 Personal history of transient ischemic attack (TIA), and cerebral infarction without residual deficits: Secondary | ICD-10-CM | POA: Diagnosis not present

## 2023-04-28 DIAGNOSIS — R7989 Other specified abnormal findings of blood chemistry: Secondary | ICD-10-CM | POA: Diagnosis not present

## 2023-04-28 DIAGNOSIS — E119 Type 2 diabetes mellitus without complications: Secondary | ICD-10-CM

## 2023-04-28 DIAGNOSIS — R7303 Prediabetes: Secondary | ICD-10-CM | POA: Diagnosis not present

## 2023-04-28 DIAGNOSIS — H539 Unspecified visual disturbance: Secondary | ICD-10-CM | POA: Diagnosis not present

## 2023-04-28 DIAGNOSIS — I69322 Dysarthria following cerebral infarction: Secondary | ICD-10-CM

## 2023-04-28 DIAGNOSIS — E0590 Thyrotoxicosis, unspecified without thyrotoxic crisis or storm: Secondary | ICD-10-CM | POA: Diagnosis not present

## 2023-04-28 HISTORY — DX: Type 2 diabetes mellitus without complications: E11.9

## 2023-04-28 MED ORDER — SACUBITRIL-VALSARTAN 24-26 MG PO TABS: 1.0000 | ORAL_TABLET | Freq: Two times a day (BID) | ORAL | 1 refills | Status: DC

## 2023-04-28 MED ORDER — SPIRONOLACTONE 25 MG PO TABS: 12.5000 mg | ORAL_TABLET | Freq: Every day | ORAL | 1 refills | Status: DC

## 2023-04-28 MED ORDER — SPIRONOLACTONE 25 MG PO TABS
12.5000 mg | ORAL_TABLET | Freq: Every day | ORAL | 1 refills | Status: DC
  Filled 2023-04-28: qty 45, 90d supply, fill #0
  Filled 2023-04-28: qty 90, 180d supply, fill #0
  Filled 2023-07-21 – 2023-07-27 (×5): qty 45, 90d supply, fill #0
  Filled ????-??-??: fill #0

## 2023-04-28 MED ORDER — TORSEMIDE 20 MG PO TABS
20.0000 mg | ORAL_TABLET | Freq: Every day | ORAL | 1 refills | Status: DC
  Filled 2023-04-28 (×2): qty 90, 90d supply, fill #0

## 2023-04-28 MED ORDER — SACUBITRIL-VALSARTAN 24-26 MG PO TABS
1.0000 | ORAL_TABLET | Freq: Two times a day (BID) | ORAL | 1 refills | Status: DC
  Filled 2023-04-28: qty 180, 90d supply, fill #0
  Filled 2023-04-28: qty 60, 30d supply, fill #0
  Filled 2023-06-06: qty 60, 30d supply, fill #1
  Filled 2023-07-21 (×2): qty 60, 30d supply, fill #0

## 2023-04-28 MED ORDER — POTASSIUM CHLORIDE CRYS ER 20 MEQ PO TBCR
40.0000 meq | EXTENDED_RELEASE_TABLET | Freq: Every day | ORAL | 1 refills | Status: DC
  Filled 2023-04-28 – 2023-07-21 (×4): qty 180, 90d supply, fill #0

## 2023-04-28 MED ORDER — METOPROLOL SUCCINATE ER 50 MG PO TB24: 50.0000 mg | ORAL_TABLET | Freq: Every day | ORAL | 1 refills | Status: DC

## 2023-04-28 MED ORDER — TORSEMIDE 20 MG PO TABS: 20.0000 mg | ORAL_TABLET | Freq: Every day | ORAL | 1 refills | Status: DC

## 2023-04-28 MED ORDER — ATORVASTATIN CALCIUM 40 MG PO TABS
40.0000 mg | ORAL_TABLET | Freq: Every day | ORAL | 1 refills | Status: DC
  Filled 2023-04-28 – 2023-05-16 (×2): qty 90, 90d supply, fill #0
  Filled 2023-08-15: qty 90, 90d supply, fill #1

## 2023-04-28 MED ORDER — METOPROLOL SUCCINATE ER 50 MG PO TB24
50.0000 mg | ORAL_TABLET | Freq: Every day | ORAL | 1 refills | Status: DC
  Filled 2023-04-28 – 2023-07-21 (×4): qty 90, 90d supply, fill #0

## 2023-04-28 NOTE — Patient Instructions (Signed)
VISIT SUMMARY:  You came in today for a follow-up after your recent stroke. We discussed your ongoing speech and balance issues, blurry vision, and concerns about affording medications. We also reviewed your recent thyroid test results and your current medication regimen.  YOUR PLAN:  -CEREBROVASCULAR ACCIDENT (STROKE): A stroke occurs when blood flow to a part of your brain is interrupted, causing brain cells to die. You are experiencing speech difficulties and balance issues as a result. Continue with your current physical therapy and start your scheduled speech therapy soon.  -ANTICOAGULATION: Anticoagulation therapy helps prevent blood clots. You are on Aspirin and Plavix to prevent another stroke. Continue taking both medications until June 03, 2023, then stop Plavix and continue Aspirin indefinitely.  -HYPERLIPIDEMIA: Hyperlipidemia means you have high levels of fats in your blood, which can increase your risk of heart disease. Continue your current statin therapy to manage this condition.  -HYPERTENSION: Hypertension is high blood pressure, which can lead to serious health problems if not managed. Continue taking your current blood pressure medications.  -PREDIABETES: Prediabetes means your blood sugar levels are higher than normal but not high enough to be classified as diabetes. Maintain a healthy diet and regular exercise to prevent progression to diabetes.  -POSSIBLE HYPERTHYROIDISM: Hyperthyroidism is when your thyroid gland produces too much thyroid hormone. We need to repeat your thyroid function test to confirm the diagnosis.  -VISUAL DISTURBANCE: You have recently experienced blurry vision, which may be related to your stroke. Monitor your vision, and if it worsens, we will refer you to an eye specialist.  -MEDICATION ACCESS: You mentioned difficulty affording your medications. We will involve a case manager to help you access your medications.  INSTRUCTIONS:  Please attend  your upcoming appointments with the cardiologist and stroke specialist. Repeat your thyroid function test as advised.

## 2023-04-28 NOTE — Progress Notes (Signed)
Subjective:  [name], female    DOB: 07-17-1965  Age: 57 y.o. MRN: [id number]  CC: Hospitalization Follow-up (Trouble with getting medication/Discuss FL2 form/)   HPI Chelsea Branch is a 57 y.o. 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 She was hospitalized from 04/11/2023 through 04/13/2023 after she had presented from work with symptoms at work which progressed during her ED course. Imaging revealed acute infarcts in the right occipital lobe, posterior aspect of left insula, left cerebellar hemisphere, possible tiny acute infarction in the left parietal lobe, trace petechial hemorrhage in the medial aspect of the right.  She was treated with TN K. Echo revealed EF of 30 to 35%, moderately decreased LVH with mild concentric LVH, grade 3 DD, severely dilated LA. She was treated with Plavix and aspirin for 3 weeks and after that to continue monotherapy with aspirin. Plan was to perform TEE outpatient.  Interval History: Discussed the use of AI scribe software for clinical note transcription with the patient, who gave verbal consent to proceed.  She presents for follow-up. She denies any weakness. She has started physical therapy and reports that her mobility is good, but she notices some balance issues when navigating stairs. Speech therapy is scheduled to start soon.  She also reports blurry vision that started about two weeks ago. She denies any history of diabetes, which could cause such a symptom. She is concerned about the impact of the stroke on her vision.  The patient is currently unemployed and expresses concern about affording her medications. She has applied for Medicaid and is seeking assistance with medication costs. She did have a follow-up visit with  cardiology but is yet to see neurology.  Review of her chart indicates suppressed TSH she has no known history of thyroid disorder.  Past Medical History:  Diagnosis Date   Abnormal thyroid blood test 03/15/2017   Abnormal uterine bleeding    Alopecia    Anemia    Angina pectoris with normal coronary arteriogram (HCC) 2017   Had + Troponin c/w ? 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 iniatially ?2/2 peripartum in 1994 - improved by 2008 then worsening EF in 2011 back down to EF 25-30%. b. echo 01/21/14 showed mod LVH, [name]; 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   Peripartum cardiomyopathy 1994   Sleep apnea 2015   CPAP 12/2013   Stroke (HCC) 2011   Systolic and diastolic CHF, acute on chronic (HCC) 06/14/2015   Tobacco abuse     Past Surgical History:  Procedure Laterality Date   CARDIAC CATHETERIZATION N/A 06/16/2015   Procedure: Left Heart Cath and Coronary Angiography;  Surgeon: Corky Crafts, MD;  Location: [facility] INVASIVE CV LAB;  Service: Cardiovascular;  Laterality: N/A;   CESAREAN  SECTION  1992  1994   LOOP RECORDER IMPLANT  ~ 2000   TIBIAL TUBERCLERPLASTY     TRANSESOPHAGEAL ECHOCARDIOGRAM (CATH LAB) N/A 04/20/2023   Procedure: TRANSESOPHAGEAL ECHOCARDIOGRAM;  Surgeon: [name], MD;  Location: [facility] INVASIVE CV LAB;  Service: Cardiovascular;  Laterality: N/A;   TUBAL LIGATION  1994    Family History  Problem Relation Age of Onset   Cancer Maternal Grandmother        uterine    Hypertension Sister    Healthy Mother    Other Neg Hx    Heart disease Neg Hx     Social History   Socioeconomic History   Marital status: Single    Spouse name: Not on file   Number of children: 3   Years of education: 12   Highest education level: Not on file  Occupational History   Occupation: unemployed  Tobacco Use   Smoking status: Former    Current packs/day: 0.00    Average packs/day: 0.1 packs/day for 30.0 years (3.0 ttl pk-yrs)    Types: Cigarettes    Start date: 06/02/1985    Quit date: 06/03/2015    Years since quitting: 7.9   Smokeless tobacco: Never   Tobacco comments:    Pt. stated she stopped smoking a year ago. 09/29/2018  Vaping Use   Vaping status: Never Used  Substance and Sexual Activity   Alcohol use: No    Alcohol/week: 0.0 standard drinks of alcohol   Drug use: No   Sexual activity: Yes    Birth control/protection: I.U.D.    Comment: fibroids  Other Topics Concern   Not on file  Social History Narrative   Lives at home with 58 yo twins ([place] and [place])   62 yo son lives outside the home   74 yo granddaughter    Social Determinants of Health   Financial Resource Strain: High Risk (03/19/2023)   Overall Financial Resource Strain (CARDIA)    Difficulty of Paying Living Expenses: Very hard  Food Insecurity: Food Insecurity Present (04/25/2023)   Hunger Vital Sign    Worried About Running Out of Food in the Last Year: Sometimes true    Ran Out of Food in the Last Year: Sometimes true  Transportation Needs: Unmet Transportation Needs (04/25/2023)   PRAPARE - Administrator, Civil Service (Medical): Yes    Lack of Transportation (Non-Medical): Yes  Physical Activity: Not on file  Stress: Stress Concern Present (03/19/2023)   [employer] of Occupational Health - Occupational Stress Questionnaire    Feeling of Stress : Rather much  Social Connections: Socially Isolated (03/19/2023)   Social Connection and Isolation Panel  [NHANES]    Frequency of Communication with Friends and Family: Once a week    Frequency of Social Gatherings with Friends and Family: Once a week    Attends Religious Services: Never    Database administrator or Organizations: No    Attends Engineer, structural: Never    Marital Status: Living with partner    Allergies  Allergen Reactions   Ace Inhibitors Cough    Outpatient Medications Prior to Visit  Medication Sig Dispense Refill   acetaminophen (TYLENOL) 500 MG tablet Take 1,000 mg by mouth as needed for mild pain (pain score 1-3) or moderate pain (pain score 4-6).     albuterol (VENTOLIN HFA) 108 (90 Base) MCG/ACT inhaler Inhale 2 puffs into the lungs every 4 (four) hours as needed for wheezing or shortness  of breath. 18 g 0   aspirin 81 MG chewable tablet Chew 1 tablet (81 mg total) by mouth daily. 30 tablet 1   clopidogrel (PLAVIX) 75 MG tablet Take 1 tablet (75 mg total) by mouth daily for 20 days. 20 tablet 0   fluticasone (FLONASE) 50 MCG/ACT nasal spray Place 2 sprays into both nostrils daily. 9.9 mL 2   Polyethylene Glycol 4500 POWD 17 g by Does not apply route daily at 6 (six) AM. Dissolve 1 capful of powder in one 8 ounce glass of water. Repeat daily or as needed to soften stool (Patient taking differently: 17 g by Does not apply route daily as needed (constipation).) 500 g 0   atorvastatin (LIPITOR) 40 MG tablet Take 1 tablet (40 mg total) by mouth daily. 30 tablet 1   metoprolol succinate (TOPROL-XL) 50 MG 24 hr tablet Take 1 tablet (50 mg total) by mouth daily. Please keep upcoming appointment in April 2024 for future refills. Thank you (Patient taking differently: Take 75 mg by mouth daily.) 90 tablet 3   potassium chloride SA (KLOR-CON M20) 20 MEQ tablet TAKE 2 TABLETS IN THE MORNING AND 1 TABLET IN THE EVENING AS DIRECTED (Patient taking differently: Take 40 mEq by mouth daily.) 270 tablet 0   sacubitril-valsartan (ENTRESTO) 24-26 MG Take 1 tablet by mouth 2  (two) times daily. (Patient taking differently: Take 1 tablet by mouth daily.) 60 tablet 4   spironolactone (ALDACTONE) 25 MG tablet Take 0.5 tablets (12.5 mg total) by mouth daily. 60 tablet 2   torsemide (DEMADEX) 20 MG tablet Take 1 tablet (20 mg total) by mouth daily. 60 tablet 3   No facility-administered medications prior to visit.     ROS Review of Systems  Constitutional:  Negative for activity change and appetite change.  HENT:  Negative for sinus pressure and sore throat.   Eyes:  Positive for visual disturbance.  Respiratory:  Negative for chest tightness, shortness of breath and wheezing.   Cardiovascular:  Negative for chest pain and palpitations.  Gastrointestinal:  Negative for abdominal distention, abdominal pain and constipation.  Genitourinary: Negative.   Musculoskeletal: Negative.   Psychiatric/Behavioral:  Negative for behavioral problems and dysphoric mood.     Objective:  BP 128/81   Pulse 75   Ht 5\' 8"  (1.727 m)   Wt 195 lb (88.5 kg)   SpO2 95%   BMI 29.65 kg/m      04/28/2023    9:58 AM 04/20/2023   10:30 AM 04/20/2023   10:20 AM  BP/Weight  Systolic BP 128 127 114  Diastolic BP 81 87 78  Wt. (Lbs) 195    BMI 29.65 kg/m2        Physical Exam Constitutional:      Appearance: She is well-developed.  Cardiovascular:     Rate and Rhythm: Normal rate.     Heart sounds: Normal heart sounds. No murmur heard. Pulmonary:     Effort: Pulmonary effort is normal.     Breath sounds: Normal breath sounds. No wheezing or rales.  Chest:     Chest wall: No tenderness.  Abdominal:     General: Bowel sounds are normal. There is no distension.     Palpations: Abdomen is soft. There is no mass.     Tenderness: There is no abdominal tenderness.  Musculoskeletal:        General: Normal range of motion.     Right lower leg: No edema.     Left lower leg:  No edema.  Neurological:     Mental Status: She is alert and oriented to person, place, and time.   Psychiatric:        Mood and Affect: Mood normal.        Latest Ref Rng & Units 04/20/2023    8:34 AM 04/11/2023    1:59 PM 04/11/2023    1:51 PM  CMP  Glucose 70 - 99 mg/dL 99  409  811   BUN 6 - 20 mg/dL 6  12  11    Creatinine 0.44 - 1.00 mg/dL 9.14  7.82  9.56   Sodium 135 - 145 mmol/L 142  139  137   Potassium 3.5 - 5.1 mmol/L 4.1  3.0  3.1   Chloride 98 - 111 mmol/L 106  98  99   CO2 22 - 32 mmol/L   26   Calcium 8.9 - 10.3 mg/dL   8.9   Total Protein 6.5 - 8.1 g/dL   7.4   Total Bilirubin <1.2 mg/dL   1.1   Alkaline Phos 38 - 126 U/L   68   AST 15 - 41 U/L   21   ALT 0 - 44 U/L   25     Lipid Panel     Component Value Date/Time   CHOL 114 04/12/2023 0727   CHOL 150 07/21/2021 1039   TRIG 58 04/12/2023 0727   HDL 33 (L) 04/12/2023 0727   HDL 42 07/21/2021 1039   CHOLHDL 3.5 04/12/2023 0727   VLDL 12 04/12/2023 0727   LDLCALC 69 04/12/2023 0727   LDLCALC 85 07/21/2021 1039    CBC    Component Value Date/Time   WBC 5.3 04/11/2023 1351   RBC 5.13 (H) 04/11/2023 1351   HGB 14.3 04/20/2023 0834   HGB 14.8 01/12/2023 1042   HCT 42.0 04/20/2023 0834   HCT 43.7 01/12/2023 1042   PLT 206 04/11/2023 1351   PLT 215 01/12/2023 1042   MCV 86.9 04/11/2023 1351   MCV 87 01/12/2023 1042   MCH 28.7 04/11/2023 1351   MCHC 33.0 04/11/2023 1351   RDW 12.4 04/11/2023 1351   RDW 13.8 01/12/2023 1042   LYMPHSABS 1.9 04/11/2023 1351   LYMPHSABS 1.9 01/12/2023 1042   MONOABS 0.4 04/11/2023 1351   EOSABS 0.0 04/11/2023 1351   EOSABS 0.1 01/12/2023 1042   BASOSABS 0.0 04/11/2023 1351   BASOSABS 0.0 01/12/2023 1042    Lab Results  Component Value Date   HGBA1C 6.3 (H) 04/11/2023    Lab Results  Component Value Date   TSH 0.384 (L) 01/12/2023    Assessment & Plan:      Cerebrovascular Accident (Stroke) Recent stroke with residual speech difficulty and mild balance issues. No motor weakness. Currently undergoing physical therapy and scheduled for speech  therapy. -Continue current rehabilitation plan. On dual antiplatelet therapy (Aspirin and Plavix) post-stroke. Discussed the risk of bleeding with prolonged dual therapy. -Continue Aspirin and Plavix for three weeks post-discharge (until 06/03/2023), then discontinue Plavix and continue Aspirin indefinitely. -Follow-up with neurology  Hyperlipidemia On statin therapy. -Continue current lipid-lowering therapy.  NICM On multiple antihypertensive medications. -Continue current antihypertensive therapy.  Prediabetes Discussed the importance of diet and exercise in preventing progression to diabetes. -Last A1c was 6.3 -Maintain healthy diet and regular exercise.  Possible Hyperthyroidism Recent abnormal thyroid function test. -Repeat thyroid function test to confirm diagnosis.  Visual Disturbance Recent onset of blurry vision, possibly related to recent stroke. -Monitor vision. If worsening, refer to ophthalmology.  Medication Access Patient reported difficulty affording medications. -Involve case manager to assist with medication access.  Follow-up Upcoming appointments with cardiologist and stroke specialist. -Attend scheduled appointments.          Meds ordered this encounter  Medications   DISCONTD: metoprolol succinate (TOPROL-XL) 50 MG 24 hr tablet    Sig: Take 1 tablet (50 mg total) by mouth daily.    Dispense:  90 tablet    Refill:  1   DISCONTD: sacubitril-valsartan (ENTRESTO) 24-26 MG    Sig: Take 1 tablet by mouth 2 (two) times daily.    Dispense:  180 tablet    Refill:  1   DISCONTD: spironolactone (ALDACTONE) 25 MG tablet    Sig: Take 0.5 tablets (12.5 mg total) by mouth daily.    Dispense:  90 tablet    Refill:  1   DISCONTD: torsemide (DEMADEX) 20 MG tablet    Sig: Take 1 tablet (20 mg total) by mouth daily.    Dispense:  90 tablet    Refill:  1   atorvastatin (LIPITOR) 40 MG tablet    Sig: Take 1 tablet (40 mg total) by mouth daily.    Dispense:   90 tablet    Refill:  1   metoprolol succinate (TOPROL-XL) 50 MG 24 hr tablet    Sig: Take 1 tablet (50 mg total) by mouth daily.    Dispense:  90 tablet    Refill:  1   potassium chloride SA (KLOR-CON M20) 20 MEQ tablet    Sig: Take 2 tablets (40 mEq total) by mouth daily.    Dispense:  180 tablet    Refill:  1   sacubitril-valsartan (ENTRESTO) 24-26 MG    Sig: Take 1 tablet by mouth 2 (two) times daily.    Dispense:  180 tablet    Refill:  1   spironolactone (ALDACTONE) 25 MG tablet    Sig: Take 0.5 tablets (12.5 mg total) by mouth daily.    Dispense:  90 tablet    Refill:  1   torsemide (DEMADEX) 20 MG tablet    Sig: Take 1 tablet (20 mg total) by mouth daily.    Dispense:  90 tablet    Refill:  1    Follow-up: Return in about 3 months (around 07/27/2023) for Chronic medical conditions.       [name], MD, FAAFP. [facility] [place], [place] [phone]   04/28/2023, 3:29 PM

## 2023-04-28 NOTE — Telephone Encounter (Signed)
I met with the patient when she was in the clinic today. She explained that she is not able to afford the copays for her medications because she had a stroke and is not working.  [facility] [address]. I asked her if she spoke to [facility]ist about waiving the copays because she needs her medications and is not able to afford them . She said she asked and all they want is the money.  I asked her when she spoke to them about this and she said a few years ago. I told her that if she asked now, they should waive the co-pays because she has Medicaid.  Again, she said they just want the money and are not going to do that.  I offered her the options of transferring all of her prescriptions to [facility] at [facility] and explained that they would be able to waive the co-pays or allow her to charge the co-pays to an account to pay off at a later date.  She was in agreement.  I spoke to Alroy Bailiff, [employer] and explained the situation.  She said the would work on the order and it would be about an hour until they are ready and the co-pays can be waived or charged to an account.   I asked Dr Alvis Lemmings to send all prescriptions to [facility].    I then explained to the patient that she will be able to pick up her prescriptions at the [facility] in about an hour.  She said that was fine and she would pick them up

## 2023-04-28 NOTE — Telephone Encounter (Signed)
I spoke with [employer] social worker today who said that she will send the FL-2 form to the patient's primary provider instead of [place]. She said that she would inform the patient today.

## 2023-04-29 ENCOUNTER — Other Ambulatory Visit: Payer: Self-pay

## 2023-04-29 ENCOUNTER — Other Ambulatory Visit: Payer: Self-pay | Admitting: Family Medicine

## 2023-04-29 DIAGNOSIS — E0590 Thyrotoxicosis, unspecified without thyrotoxic crisis or storm: Secondary | ICD-10-CM

## 2023-04-29 LAB — TSH: TSH: 0.13 u[IU]/mL — ABNORMAL LOW (ref 0.450–4.500)

## 2023-04-29 LAB — T4, FREE: Free T4: 1.46 ng/dL (ref 0.82–1.77)

## 2023-04-29 LAB — T3: T3, Total: 197 ng/dL — ABNORMAL HIGH (ref 71–180)

## 2023-04-29 MED ORDER — METHIMAZOLE 5 MG PO TABS: 5.0000 mg | ORAL_TABLET | Freq: Two times a day (BID) | ORAL | 2 refills | Status: DC

## 2023-04-29 MED ORDER — METHIMAZOLE 5 MG PO TABS
5.0000 mg | ORAL_TABLET | Freq: Two times a day (BID) | ORAL | 2 refills | Status: DC
  Filled 2023-04-29: qty 60, 30d supply, fill #0

## 2023-05-05 ENCOUNTER — Telehealth: Payer: Self-pay

## 2023-05-05 NOTE — Telephone Encounter (Signed)
Completed FL2 for special assistance/ in home program emailed to [place], [place] [facility].  This is not for placement.

## 2023-05-06 LAB — THYROGLOBULIN ANTIBODY: Thyroglobulin Antibody: <1 [IU]/mL (ref 0.0–0.9)

## 2023-05-06 LAB — THYROID PEROXIDASE ANTIBODY: Thyroperoxidase Ab SerPl-aCnc: 11 [IU]/mL (ref 0–34)

## 2023-05-06 LAB — SPECIMEN STATUS REPORT

## 2023-05-10 ENCOUNTER — Other Ambulatory Visit (INDEPENDENT_AMBULATORY_CARE_PROVIDER_SITE_OTHER): Payer: Medicaid Other

## 2023-05-10 ENCOUNTER — Encounter: Payer: Self-pay | Admitting: Internal Medicine

## 2023-05-10 ENCOUNTER — Ambulatory Visit: Payer: Medicaid Other | Attending: Internal Medicine | Admitting: Internal Medicine

## 2023-05-10 ENCOUNTER — Other Ambulatory Visit: Payer: Self-pay | Admitting: Internal Medicine

## 2023-05-10 VITALS — BP 122/84 | HR 85 | Ht 68.0 in | Wt 197.6 lb

## 2023-05-10 DIAGNOSIS — I420 Dilated cardiomyopathy: Secondary | ICD-10-CM

## 2023-05-10 DIAGNOSIS — I5032 Chronic diastolic (congestive) heart failure: Secondary | ICD-10-CM

## 2023-05-10 DIAGNOSIS — R002 Palpitations: Secondary | ICD-10-CM

## 2023-05-10 NOTE — Progress Notes (Signed)
Patient Care Team: Storm Frisk, MD as PCP - General (Pulmonary Disease) [name], MD as PCP - Cardiology (Cardiology) [name], MD as PCP - Electrophysiology (Cardiology)   HPI  Chelsea Branch is a 57 y.o. 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 She had recovery of LV function most recently in 2008.   She has significant hypertension.   She was rehospitalized 3/15 for acute on chronic congestive heart failure. She was treated with IV diuresis. Echocardiogram demonstrated EF 25-30%.   . Echocardiogram 8/15 demonstrated normalization of LV systolic function  [facility] hospitalization 11/24 >> strokes -->> TNK  multiplle lesions  Significant recovery. Echocardiogram (See Below)  TEE had to be aborted because of apnea.  A bubble study was suggested  Some weakness  mild shortness of breath    DATE TEST EF   2008 Echo  Normal    1/17 Cath Normal CA   1/17 Echo  25-30 %   1/18 Echo  30-35%   2/20  Echo    60-65%   4/23 Echo  65%   11/24 Echo  30-35% Severe left atrial enlargement   Date Cr K Hgb TSH   10/18 0.83 3.5     5/21 1.03 3.7     11/24 0.8 4.1 14.3 0.130         Past Medical History:  Diagnosis Date   Abnormal thyroid blood test 03/15/2017   Abnormal uterine bleeding    Alopecia    Anemia    Angina pectoris with normal coronary arteriogram (HCC) 2017   Had + Troponin c/w ? 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 iniatially ?2/2 peripartum in 1994 - improved by 2008 then worsening EF in 2011 back down to EF 25-30%. b. echo 01/21/14 showed mod LVH, [name];  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   Peripartum cardiomyopathy 1994   Sleep apnea 2015   CPAP 12/2013   Stroke (HCC) 2011   Systolic and diastolic CHF, acute on chronic (HCC) 06/14/2015   Tobacco abuse     Past Surgical History:  Procedure Laterality Date   CARDIAC CATHETERIZATION N/A 06/16/2015   Procedure: Left Heart Cath and Coronary Angiography;  Surgeon: Corky Crafts, MD;  Location: [facility] INVASIVE CV LAB;  Service: Cardiovascular;  Laterality: N/A;   CESAREAN SECTION  1992  1994   LOOP RECORDER IMPLANT  ~ 2000   TIBIAL TUBERCLERPLASTY     TRANSESOPHAGEAL ECHOCARDIOGRAM (CATH LAB) N/A 04/20/2023   Procedure: TRANSESOPHAGEAL ECHOCARDIOGRAM;  Surgeon: [name], MD;  Location: [facility] INVASIVE CV LAB;  Service: Cardiovascular;  Laterality: N/A;   TUBAL LIGATION  1994    Current Outpatient Medications  Medication Sig Dispense Refill   acetaminophen (TYLENOL) 500 MG tablet Take 1,000 mg by mouth as needed for mild pain (pain score 1-3) or moderate pain (pain score 4-6).     albuterol (VENTOLIN HFA) 108 (90 Base) MCG/ACT  inhaler Inhale 2 puffs into the lungs every 4 (four) hours as needed for wheezing or shortness of breath. 18 g 0   aspirin 81 MG chewable tablet Chew 1 tablet (81 mg total) by mouth daily. 30 tablet 1   atorvastatin (LIPITOR) 40 MG tablet Take 1 tablet (40 mg total) by mouth daily. 90 tablet 1   fluticasone (FLONASE) 50 MCG/ACT nasal spray Place 2 sprays into both nostrils daily. 9.9 mL 2   methimazole (TAPAZOLE) 5 MG tablet Take 1 tablet (5 mg total) by mouth 2 (two) times daily. 60 tablet 2   metoprolol succinate (TOPROL-XL) 50 MG 24 hr tablet Take 1 tablet (50 mg total) by mouth daily. 90 tablet 1   Polyethylene Glycol 4500 POWD 17 g by Does not apply route daily at 6 (six) AM. Dissolve 1 capful of powder in one 8  ounce glass of water. Repeat daily or as needed to soften stool (Patient taking differently: 17 g by Does not apply route daily as needed (constipation).) 500 g 0   potassium chloride SA (KLOR-CON M20) 20 MEQ tablet Take 2 tablets (40 mEq total) by mouth daily. 180 tablet 1   sacubitril-valsartan (ENTRESTO) 24-26 MG Take 1 tablet by mouth 2 (two) times daily. 180 tablet 1   spironolactone (ALDACTONE) 25 MG tablet Take 0.5 tablets (12.5 mg total) by mouth daily. 90 tablet 1   torsemide (DEMADEX) 20 MG tablet Take 1 tablet (20 mg total) by mouth daily. 90 tablet 1   No current facility-administered medications for this visit.    Allergies  Allergen Reactions   Ace Inhibitors Cough    Review of Systems negative except from HPI and PMH  Physical Exam BP 122/84   Pulse 85   Ht 5\' 8"  (1.727 m)   Wt 197 lb 9.6 oz (89.6 kg)   SpO2 99%   BMI 30.04 kg/m  Well developed and nourished in no acute distress HENT normal Neck supple with JVP-  flat  Clear Regular rate and rhythm, no murmurs or gallops Abd-soft with active BS No Clubbing cyanosis edema Skin-warm and dry A & Oriented  Grossly normal sensory and motor function  ECG sinus @ 84 18/09/38   Assessment and  Plan Strokes  multiple  Rx TNK  Nonischemic cardiomyopathy recurrent  Congestive heart failure chronic  Hypertension-resolved  Obstructive sleep apnea AHI 18   Hyperthyroidism  treated  Palpitations given that she is having     The patient has a couple of acute issues.  The first is worsening of her LV function associated with severe left atrial enlargement.  Either these could be a source of stroke, unfortunately her TEE had to be aborted because of apnea.  To look for atrial fibrillation, given that she is having palpitations, we will use an AT monitor from [employer] and if this is negative we will implant a Linq  Her left ventricular dysfunction is of concern.  Will be repeated.  I do not know whether this was related  acutely to the stroke.  In the event that she remains impaired, GDMT would be appropriate and at some point consideration possibly for primary prevention ICD  Functional status has deteriorated.  She is going to apply for SSI.  Not unreasonable.

## 2023-05-10 NOTE — Patient Instructions (Addendum)
Medication Instructions:  Your physician recommends that you continue on your current medications as directed. Please refer to the Current Medication list given to you today.  *If you need a refill on your cardiac medications before your next appointment, please call your pharmacy*   Lab Work: None ordered.  If you have labs (blood work) drawn today and your tests are completely normal, you will receive your results only by: MyChart Message (if you have MyChart) OR A paper copy in the mail If you have any lab test that is abnormal or we need to change your treatment, we will call you to review the results.   Testing/Procedures:  Your physician has requested that you have an echocardiogram with bubble study. Echocardiography is a painless test that uses sound waves to create images of your heart. It provides your doctor with information about the size and shape of your heart and how well your heart's chambers and valves are working. This procedure takes approximately one hour. There are no restrictions for this procedure. Please do NOT wear cologne, perfume, aftershave, or lotions (deodorant is allowed). Please arrive 15 minutes prior to your appointment time.  Please note: We ask at that you not bring children with you during ultrasound (echo/ vascular) testing. Due to room size and safety concerns, children are not allowed in the ultrasound rooms during exams. Our front office staff cannot provide observation of children in our lobby area while testing is being conducted. An adult accompanying a patient to their appointment will only be allowed in the ultrasound room at the discretion of the ultrasound technician under special circumstances. We apologize for any inconvenience.  ZIO AT Long term monitor-Live Telemetry  Your physician has requested you wear a ZIO patch monitor for 14 days.  This is a single patch monitor. [employer] supplies one patch monitor per enrollment. Additional   stickers are not available.  Please do not apply patch if you will be having a Nuclear Stress Test, Echocardiogram, Cardiac CT, MRI,  or Chest Xray during the period you would be wearing the monitor. The patch cannot be worn during  these tests. You cannot remove and re-apply the ZIO AT patch monitor.  Your ZIO patch monitor will be mailed 3 day USPS to your address on file. It may take 3-5 days to  receive your monitor after you have been enrolled.  Once you have received your monitor, please review the enclosed instructions. Your monitor has  already been registered assigning a specific monitor serial # to you.   Billing and Patient Assistance Program information  Meredeth Ide has been supplied with any insurance information on record for billing. [employer] offers a sliding scale Patient Assistance Program for patients without insurance, or whose  insurance does not completely cover the cost of the ZIO patch monitor. You must apply for the  Patient Assistance Program to qualify for the discounted rate. To apply, call [employer] at [phone],  select option 4, select option 2 , ask to apply for the Patient Assistance Program, (you can request an  interpreter if needed). [employer] will ask your household income and how many people are in your  household. [employer] will quote your out-of-pocket cost based on this information. They will also be able  to set up a 12 month interest free payment plan if needed.  Applying the monitor   Shave hair from upper left chest.  Hold the abrader disc by orange tab. Rub the abrader in 40 strokes over left upper chest as  indicated in  your monitor instructions.  Clean area with 4 enclosed alcohol pads. Use all pads to ensure the area is cleaned thoroughly. Let  dry.  Apply patch as indicated in monitor instructions. Patch will be placed under collarbone on left side of  chest with arrow pointing upward.  Rub patch adhesive wings for 2 minutes. Remove the white  label marked "1". Remove the white label  marked "2". Rub patch adhesive wings for 2 additional minutes.  While looking in a mirror, press and release button in center of patch. A small green light will flash 3-4  times. This will be your only indicator that the monitor has been turned on.  Do not shower for the first 24 hours. You may shower after the first 24 hours.  Press the button if you feel a symptom. You will hear a small click. Record Date, Time and Symptom in  the Patient Log.   Starting the Gateway  In your kit there is a Audiological scientist box the size of a cellphone. This is Buyer, retail. It transmits all your  recorded data to [facility]. This box must always stay within 10 feet of you. Open the box and push the *  button. There will be a light that blinks orange and then green a few times. When the light stops  blinking, the Gateway is connected to the ZIO patch. Call [employer] at [phone] to confirm your monitor is transmitting.  Returning your monitor  Remove your patch and place it inside the Gateway. In the lower half of the Gateway there is a white  bag with prepaid postage on it. Place Gateway in bag and seal. Mail package back to [facility] as soon as  possible. Your physician should have your final report approximately 7 days after you have mailed back  your monitor. Call [facility] Customer Care at [phone] if you have questions regarding your ZIO AT  patch monitor. Call them immediately if you see an orange light blinking on your monitor.  If your monitor falls off in less than 4 days, contact our Monitor department at [phone]. If your  monitor becomes loose or falls off after 4 days call [employer] at [phone] for suggestions on  securing your monitor    Follow-Up: At [facility], you and your health needs are our priority.  As part of our continuing mission to provide you with exceptional heart care, we have created designated Provider  Care Teams.  These Care Teams include your primary Cardiologist (physician) and Advanced Practice Providers (APPs -  Physician Assistants and Nurse Practitioners) who all work together to provide you with the care you need, when you need it.  We recommend signing up for the patient portal called "MyChart".  Sign up information is provided on this After Visit Summary.  MyChart is used to connect with patients for Virtual Visits (Telemedicine).  Patients are able to view lab/test results, encounter notes, upcoming appointments, etc.  Non-urgent messages can be sent to your provider as well.   To learn more about what you can do with MyChart, go to [email or web address].    Your next appointment:   AS scheduled with Virgina Norfolk for Internal Loop Implant

## 2023-05-10 NOTE — Progress Notes (Unsigned)
Enrolled for [employer] to mail a ZIO AT Live Telemetry monitor to patients address on file.  

## 2023-05-11 ENCOUNTER — Inpatient Hospital Stay: Payer: Self-pay | Admitting: Neurology

## 2023-05-11 NOTE — Progress Notes (Deleted)
Patient: Chelsea Branch Date of Birth: [birth year]  Reason for Visit: Stroke Clinic Follow up History from: Patient Primary Neurologist: Pearlean Brownie   ASSESSMENT AND PLAN 57 y.o. year old female with acute infarcts in the right occipital lobe, posterior aspect of the left insula and left cerebellar hemisphere.  Possible tiny acute infarction in the left parietal lobe.  Trace petechial hemorrhage in the medial aspect of the right.  TNK 04/11/2023.  Etiology suspect cardioembolic versus cryptogenic.  Vascular risk factors: HTN, HLD.   HISTORY OF PRESENT ILLNESS: Today 05/11/23  HISTORY  Presented to the ER with complaints of feeling off, blurry vision, headache.  While waiting in the ER for her workup she developed expressive aphasia and a code stroke was activated.  She had right upper extremity weakness, confusion, aphasia.  TNK was given.  Found to have acute infarcts in right occipital lobe, posterior aspect of left insula and left cerebellar hemisphere.  Possible tiny acute infarction left parietal lobe, trace petechial hemorrhage in the medial aspect of the right.  Cryptogenic versus cardioembolic.  History of stroke in 2011.  Tried CPAP 2015 unable to tolerate. Saw cardiology Dr. Graciela Husbands yesterday. Having palpations. Started on a Zio patch if negative we will implant a Linq.   -CTA head and neck no LVO -Code stroke CT head hypodensity and loss of gray-white differentiation in the right occipital lobe concerning for acute infarct -MRI of the brain acute infarcts in the right occipital lobe, posterior aspect of left insulae and left cerebellar hemisphere, possible tiny acute infarction in the left parietal lobe,trace petechial hemorrhage in the medial aspect of the right occipital lobe and posterior left insula -2D echo EF 30 to 35%, moderately decreased LVH function with mild concentric left ventricular hypertrophy.  Grade 3 diastolic dysfunction, left atria severely dilated -TEE had to be  aborted because of apnea.  A bubble study was suggested. -LDL 69 -A1c 6.3 -UDS negative -No antithrombotic prior to admission, DAPT aspirin 81 and Plavix 75 for 3 weeks, then aspirin alone -ANA was positive, anticardiolipin IgM was 14, was referred to rheumatology  REVIEW OF SYSTEMS: Out of a complete 14 system review of symptoms, the patient complains only of the following symptoms, and all other reviewed systems are negative.  See HPI  ALLERGIES: Allergies  Allergen Reactions   Ace Inhibitors Cough    HOME MEDICATIONS: Outpatient Medications Prior to Visit  Medication Sig Dispense Refill   acetaminophen (TYLENOL) 500 MG tablet Take 1,000 mg by mouth as needed for mild pain (pain score 1-3) or moderate pain (pain score 4-6).     albuterol (VENTOLIN HFA) 108 (90 Base) MCG/ACT inhaler Inhale 2 puffs into the lungs every 4 (four) hours as needed for wheezing or shortness of breath. 18 g 0   aspirin 81 MG chewable tablet Chew 1 tablet (81 mg total) by mouth daily. 30 tablet 1   atorvastatin (LIPITOR) 40 MG tablet Take 1 tablet (40 mg total) by mouth daily. 90 tablet 1   fluticasone (FLONASE) 50 MCG/ACT nasal spray Place 2 sprays into both nostrils daily. 9.9 mL 2   methimazole (TAPAZOLE) 5 MG tablet Take 1 tablet (5 mg total) by mouth 2 (two) times daily. 60 tablet 2   metoprolol succinate (TOPROL-XL) 50 MG 24 hr tablet Take 1 tablet (50 mg total) by mouth daily. 90 tablet 1   Polyethylene Glycol 4500 POWD 17 g by Does not apply route daily at 6 (six) AM. Dissolve 1 capful of powder in  one 8 ounce glass of water. Repeat daily or as needed to soften stool (Patient taking differently: 17 g by Does not apply route daily as needed (constipation).) 500 g 0   potassium chloride SA (KLOR-CON M20) 20 MEQ tablet Take 2 tablets (40 mEq total) by mouth daily. 180 tablet 1   sacubitril-valsartan (ENTRESTO) 24-26 MG Take 1 tablet by mouth 2 (two) times daily. 180 tablet 1   spironolactone (ALDACTONE)  25 MG tablet Take 0.5 tablets (12.5 mg total) by mouth daily. 90 tablet 1   torsemide (DEMADEX) 20 MG tablet Take 1 tablet (20 mg total) by mouth daily. 90 tablet 1   No facility-administered medications prior to visit.    PAST MEDICAL HISTORY: Past Medical History:  Diagnosis Date   Abnormal thyroid blood test 03/15/2017   Abnormal uterine bleeding    Alopecia    Anemia    Angina pectoris with normal coronary arteriogram (HCC) 2017   Had + Troponin c/w ? 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 iniatially ?2/2 peripartum in 1994 - improved by 2008 then worsening EF in 2011 back down to EF 25-30%. b. echo 01/21/14 showed mod LVH, [name]; 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   Peripartum cardiomyopathy 1994   Sleep apnea 2015   CPAP 12/2013   Stroke (HCC) 2011   Systolic and diastolic CHF, acute on chronic (HCC) 06/14/2015   Tobacco abuse     PAST SURGICAL HISTORY: Past Surgical History:  Procedure Laterality Date   CARDIAC CATHETERIZATION N/A 06/16/2015   Procedure: Left Heart Cath and Coronary Angiography;  Surgeon: Corky Crafts, MD;  Location: [facility] INVASIVE CV LAB;  Service: Cardiovascular;  Laterality: N/A;   CESAREAN SECTION  1992  1994   LOOP RECORDER IMPLANT  ~ 2000   TIBIAL TUBERCLERPLASTY     TRANSESOPHAGEAL ECHOCARDIOGRAM (CATH LAB) N/A 04/20/2023   Procedure: TRANSESOPHAGEAL ECHOCARDIOGRAM;  Surgeon: [name], MD;  Location: [facility] INVASIVE CV LAB;  Service: Cardiovascular;   Laterality: N/A;   TUBAL LIGATION  1994    FAMILY HISTORY: Family History  Problem Relation Age of Onset   Cancer Maternal Grandmother        uterine   Hypertension Sister    Healthy Mother    Other Neg Hx    Heart disease Neg Hx     SOCIAL HISTORY: Social History   Socioeconomic History   Marital status: Single    Spouse name: Not on file   Number of children: 3   Years of education: 12   Highest education level: Not on file  Occupational History   Occupation: unemployed  Tobacco Use   Smoking status: Former    Current packs/day: 0.00    Average packs/day: 0.1 packs/day for 30.0 years (3.0 ttl pk-yrs)    Types: Cigarettes    Start date: 06/02/1985    Quit date: 06/03/2015    Years since quitting: 7.9   Smokeless tobacco: Never   Tobacco comments:  Pt. stated she stopped smoking a year ago. 09/29/2018  Vaping Use   Vaping status: Never Used  Substance and Sexual Activity   Alcohol use: No    Alcohol/week: 0.0 standard drinks of alcohol   Drug use: No   Sexual activity: Yes    Birth control/protection: I.U.D.    Comment: fibroids  Other Topics Concern   Not on file  Social History Narrative   Lives at home with 58 yo twins ([place] and [place])   61 yo son lives outside the home   9 yo granddaughter    Social Drivers of Health   Financial Resource Strain: High Risk (03/19/2023)   Overall Financial Resource Strain (CARDIA)    Difficulty of Paying Living Expenses: Very hard  Food Insecurity: Food Insecurity Present (04/25/2023)   Hunger Vital Sign    Worried About Running Out of Food in the Last Year: Sometimes true    Ran Out of Food in the Last Year: Sometimes true  Transportation Needs: Unmet Transportation Needs (04/25/2023)   PRAPARE - Administrator, Civil Service (Medical): Yes    Lack of Transportation (Non-Medical): Yes  Physical Activity: Not on file  Stress: Stress Concern Present (03/19/2023)   [employer] of Occupational Health  - Occupational Stress Questionnaire    Feeling of Stress : Rather much  Social Connections: Socially Isolated (03/19/2023)   Social Connection and Isolation Panel [NHANES]    Frequency of Communication with Friends and Family: Once a week    Frequency of Social Gatherings with Friends and Family: Once a week    Attends Religious Services: Never    Database administrator or Organizations: No    Attends Banker Meetings: Never    Marital Status: Living with partner  Intimate Partner Violence: Not At Risk (04/25/2023)   Humiliation, Afraid, Rape, and Kick questionnaire    Fear of Current or Ex-Partner: No    Emotionally Abused: No    Physically Abused: No    Sexually Abused: No    PHYSICAL EXAM  There were no vitals filed for this visit. There is no height or weight on file to calculate BMI.  Generalized: Well developed, in no acute distress  Neurological examination  Mentation: Alert oriented to time, place, history taking. Follows all commands speech and language fluent Cranial nerve II-XII: Pupils were equal round reactive to light. Extraocular movements were full, visual field were full on confrontational test. Facial sensation and strength were normal. Uvula tongue midline. Head turning and shoulder shrug  were normal and symmetric. Motor: The motor testing reveals 5 over 5 strength of all 4 extremities. Good symmetric motor tone is noted throughout.  Sensory: Sensory testing is intact to soft touch on all 4 extremities. No evidence of extinction is noted.  Coordination: Cerebellar testing reveals good finger-nose-finger and heel-to-shin bilaterally.  Gait and station: Gait is normal. Tandem gait is normal. Romberg is negative. No drift is seen.  Reflexes: Deep tendon reflexes are symmetric and normal bilaterally.   DIAGNOSTIC DATA (LABS, IMAGING, TESTING) - I reviewed patient records, labs, notes, testing and imaging myself where available.  Lab Results  Component  Value Date   WBC 5.3 04/11/2023   HGB 14.3 04/20/2023   HCT 42.0 04/20/2023   MCV 86.9 04/11/2023   PLT 206 04/11/2023      Component Value Date/Time   NA 142 04/20/2023 0834   NA 141 01/12/2023 1042   K 4.1 04/20/2023 0834   CL 106  04/20/2023 0834   CO2 26 04/11/2023 1351   GLUCOSE 99 04/20/2023 0834   BUN 6 04/20/2023 0834   BUN 16 01/12/2023 1042   CREATININE 0.80 04/20/2023 0834   CREATININE 0.82 04/06/2016 1441   CALCIUM 8.9 04/11/2023 1351   PROT 7.4 04/11/2023 1351   PROT 6.6 01/12/2023 1042   ALBUMIN 3.2 (L) 04/11/2023 1351   ALBUMIN 4.0 01/12/2023 1042   AST 21 04/11/2023 1351   ALT 25 04/11/2023 1351   ALKPHOS 68 04/11/2023 1351   BILITOT 1.1 04/11/2023 1351   BILITOT 0.4 01/12/2023 1042   GFRNONAA 49 (L) 04/11/2023 1351   GFRNONAA 83 04/06/2016 1441   GFRAA 71 10/11/2019 1012   GFRAA >89 04/06/2016 1441   Lab Results  Component Value Date   CHOL 114 04/12/2023   HDL 33 (L) 04/12/2023   LDLCALC 69 04/12/2023   TRIG 58 04/12/2023   CHOLHDL 3.5 04/12/2023   Lab Results  Component Value Date   HGBA1C 6.3 (H) 04/11/2023   Lab Results  Component Value Date   VITAMINB12 276 05/18/2010   Lab Results  Component Value Date   TSH 0.130 (L) 04/28/2023    Margie Ege, AGNP-C, DNP 05/11/2023, 5:16 AM [facility] [address], [address] [place], [place] [address] [phone]

## 2023-05-16 ENCOUNTER — Other Ambulatory Visit: Payer: Self-pay

## 2023-05-16 ENCOUNTER — Telehealth: Payer: Self-pay | Admitting: Internal Medicine

## 2023-05-16 ENCOUNTER — Ambulatory Visit: Payer: Self-pay

## 2023-05-16 DIAGNOSIS — I420 Dilated cardiomyopathy: Secondary | ICD-10-CM | POA: Diagnosis not present

## 2023-05-16 DIAGNOSIS — R002 Palpitations: Secondary | ICD-10-CM

## 2023-05-16 DIAGNOSIS — I5032 Chronic diastolic (congestive) heart failure: Secondary | ICD-10-CM | POA: Diagnosis not present

## 2023-05-16 NOTE — Telephone Encounter (Signed)
Patient called to report she received her Zio heart monitor today.

## 2023-05-16 NOTE — Telephone Encounter (Signed)
  Chief Complaint: medication assistance  Symptoms: NA Frequency:  Pertinent Negatives: NA Disposition: [] ED /[] Urgent Care (no appt availability in office) / [] Appointment(In office/virtual)/ []  [place] [facility]/ [x] Home Care/ [] Refused Recommended Disposition /[] [place] Mobile [facility]/ []  Follow-up with PCP Additional Notes: pt is wanting to review medications and make sure she is taking correct meds. Pt doesn't have money to pay for co-pays. Reviewed note from Jane on 04/28/23 where all rxs were changed to [facility] so pt can waive or charge co-pays. Pt is needing refill on atorvastatin and ASA, advised pt I would call [facility] and see if they had these on file and if she can waive co-pay. Called [facility], spoke with Dahlia Client, [facility]. She states they have atorvastatin and ASA on file, is able to process refill and copay would be $4.56, pt can charge copay to [facility] account. Called pt back and advised of this and let her know they will have rxs ready for PU in the morning. No further assistance noted.   Summary: Pt has concerns about the medications she should be taking   Pt requests that a nurse return her call to advise which medications she is suppose to be taking. Cb#  [phone]         Reason for Disposition  Caller has medicine question only, adult not sick, AND triager answers question  Answer Assessment - Initial Assessment Questions 1. NAME of MEDICINE: "What medicine(s) are you calling about?"     All of her medications 2. QUESTION: "What is your question?" (e.g., double dose of medicine, side effect)     Needing to review medications and see what meds she is suppose to be taking  3. PRESCRIBER: "Who prescribed the medicine?" Reason: if prescribed by specialist, call should be referred to that group.     Dr. Alvis Lemmings  Protocols used: Medication Question Call-A-AH

## 2023-05-16 NOTE — Telephone Encounter (Signed)
Returned call to patient who doesn't have any questions, just letting us know she got her zio monitor and will be applying it tomorrow.

## 2023-05-17 ENCOUNTER — Other Ambulatory Visit: Payer: Self-pay

## 2023-05-23 ENCOUNTER — Ambulatory Visit: Payer: Self-pay | Admitting: *Deleted

## 2023-05-23 DIAGNOSIS — Z8673 Personal history of transient ischemic attack (TIA), and cerebral infarction without residual deficits: Secondary | ICD-10-CM

## 2023-05-23 DIAGNOSIS — I69322 Dysarthria following cerebral infarction: Secondary | ICD-10-CM

## 2023-05-23 NOTE — Telephone Encounter (Signed)
Spoke with patient. Patient has appointment with neurology on 06/15/2023. Patient advised that if s/s worsen to got to ED.

## 2023-05-23 NOTE — Telephone Encounter (Signed)
She informed me at her last visit she had an upcoming appointment for speech therapy.  I have placed referral for speech therapy and to ophthalmology.

## 2023-05-23 NOTE — Addendum Note (Signed)
Addended by: [name] on: 05/23/2023 02:07 PM   Modules accepted: Orders

## 2023-05-23 NOTE — Telephone Encounter (Signed)
Summary: Issues with speech and eye sight   The patient called in stating she has had problems with her eye sight since she had a stroke in November. She briefly mentioned this to the provider she saw in early December. She thinks she may need a referral for an eye doctor and a speech therapist. Please assist patient further         Chief Complaint: Vision and speech Symptoms: Left eye vision blurred since CVA in Nov 25, "And my speech is not right since then, stutter sometimes too" Frequency: Nov s/p CVA Pertinent Negatives: Patient denies worsening Disposition: [] ED /[] Urgent Care (no appt availability in office) / [] Appointment(In office/virtual)/ []  [place] [facility]/ [] Home Care/ [] Refused Recommended Disposition /[] [place] Mobile Bus/ [x]  Follow-up with PCP Additional Notes:  Pt requesting referrals for issues.   Please advise.  Reason for Disposition  Nursing judgment or information in reference  Answer Assessment - Initial Assessment Questions 1. REASON FOR CALL: "What is your main concern right now?"     Vision and speech 2. ONSET: "When did the  start?"     November after stroke 3. SEVERITY: "How bad is the ?"     Blurry, stutter  Protocols used: No Guideline Available-A-AH

## 2023-05-24 ENCOUNTER — Ambulatory Visit: Payer: Medicaid Other

## 2023-05-26 ENCOUNTER — Telehealth: Payer: Self-pay | Admitting: Internal Medicine

## 2023-05-26 NOTE — Telephone Encounter (Signed)
 Patient called stating she only wore the Zio monitor for 4 days because it caused a rash on her skin.  She returned the monitor. She wants to know what she should do next.

## 2023-05-26 NOTE — Telephone Encounter (Signed)
 Monitor will be delivered to [facility] Monday, 05/29/22.  They will process whatever did record.  Once Dr. Fernande reviews your results, he will be able to tell you if he has enough information. If you ever need to wear a monitor in the future, please let your provider know you had a reaction to the ZIO Patch monitor.  There are other monitor companies which offer a sensitive skin alternative.

## 2023-05-31 ENCOUNTER — Ambulatory Visit (HOSPITAL_COMMUNITY): Payer: Medicaid Other | Attending: General Practice

## 2023-05-31 DIAGNOSIS — I420 Dilated cardiomyopathy: Secondary | ICD-10-CM | POA: Diagnosis not present

## 2023-05-31 DIAGNOSIS — I5032 Chronic diastolic (congestive) heart failure: Secondary | ICD-10-CM | POA: Diagnosis not present

## 2023-05-31 LAB — ECHOCARDIOGRAM LIMITED BUBBLE STUDY
Area-P 1/2: 3.72 cm2
S' Lateral: 5.5 cm

## 2023-06-06 ENCOUNTER — Other Ambulatory Visit: Payer: Self-pay

## 2023-06-07 ENCOUNTER — Other Ambulatory Visit: Payer: Self-pay

## 2023-06-07 NOTE — Therapy (Deleted)
 OUTPATIENT SPEECH LANGUAGE PATHOLOGY APHASIA EVALUATION   Patient Name: Chelsea Branch MRN: [id number] DOB:08-19-1965, 58 y.o., female Today's Date: 06/07/2023  PCP: Vernell Goldsmith MD REFERRING PROVIDER: Lisabeth Rider, MD  END OF SESSION:   Past Medical History:  Diagnosis Date   Abnormal thyroid  blood test 03/15/2017   Abnormal uterine bleeding    Alopecia    Anemia    Angina pectoris with normal coronary arteriogram (HCC) 2017   Had + Troponin c/w ? 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 iniatially ?2/2 peripartum in 1994 - improved by 2008 then worsening EF in 2011 back down to EF 25-30%. b. echo 01/21/14 showed mod LVH, [name]; 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   Peripartum cardiomyopathy 1994   Sleep apnea 2015   CPAP 12/2013   Stroke (HCC) 2011   Systolic and diastolic CHF, acute on chronic (HCC) 06/14/2015   Tobacco abuse    Past Surgical History:  Procedure Laterality Date   CARDIAC CATHETERIZATION N/A 06/16/2015   Procedure: Left Heart Cath and Coronary Angiography;  Surgeon: Lucendia Rusk, MD;  Location: [facility] INVASIVE CV LAB;  Service: Cardiovascular;  Laterality: N/A;   CESAREAN SECTION  1992  1994   LOOP RECORDER IMPLANT  ~ 2000   TIBIAL TUBERCLERPLASTY     TRANSESOPHAGEAL ECHOCARDIOGRAM (CATH LAB) N/A 04/20/2023   Procedure: TRANSESOPHAGEAL ECHOCARDIOGRAM;  Surgeon: Bridgette Campus, MD;  Location: [facility] INVASIVE CV LAB;  Service: Cardiovascular;  Laterality: N/A;    TUBAL LIGATION  1994   Patient Active Problem List   Diagnosis Date Noted   Prediabetes 04/28/2023   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) 04/28/2023   Cerebellar stroke, acute (HCC) 04/13/2023   History of stroke 04/11/2023   Liletta  IUD (intrauterine device) in place since 12/15/2015 04/06/2023   Bacterial vaginitis 01/13/2023   External otitis of both ears due to fungus 10/07/2022   Chronic pain syndrome 12/28/2021   Primary osteoarthritis of both knees 12/28/2021   Spinal stenosis of lumbar region 07/21/2021   Lumbar radiculitis 07/21/2021   COPD (chronic obstructive pulmonary disease) (HCC) 07/21/2021   Mechanical complication due to intrauterine contraceptive device 07/21/2021   Arthropathy of lumbar facet joint 03/14/2019   Degenerative disc disease at L5-S1 level 10/10/2018   Low TSH level 04/26/2016   Abnormal uterine bleeding 04/06/2016   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   History of non-ST elevation myocardial infarction (NSTEMI) 06/14/2015   Homelessness 09/16/2014   Vitamin D  deficiency 01/29/2014   OSA (obstructive sleep apnea) 01/08/2014   Chronic combined systolic and diastolic CHF (congestive heart failure) (HCC) 08/14/2013   Essential hypertension    Fibroids 08/09/2012   Menorrhagia 05/08/2012   Female pattern hair loss    HLD (hyperlipidemia) 06/08/2010   CEREBRAL ANEURYSM 06/08/2010   History of cardiovascular disorder 06/08/2010    ONSET DATE: 04/13/2023 (referral date)   REFERRING DIAG: I63.9 (ICD-10-CM) - Cerebellar stroke, acute (HCC) I63.9 (ICD-10-CM) - Acute  cerebral infarction  THERAPY DIAG:  No diagnosis found.  Rationale for Evaluation and Treatment: Rehabilitation  SUBJECTIVE:   SUBJECTIVE STATEMENT: *** Pt accompanied by: {accompnied:27141}  PERTINENT HISTORY: "Chelsea Branch is a 57 yo female presenting to ED 11/18 with expressive aphasia, R UE weakness, and confusion. MRI Brain with  acute infarcts in the acute occipital lobe, posterior aspect of L insula, and L cerebellar hemisphere. TNK administered. PMH includes prior CVA (2011), OSA, tobacco use, chronic combined CHF, HTN, NSTEMI (2017), NIDCM"   PAIN:  Are you having pain? {OPRCPAIN:27236}  FALLS: Has patient fallen in last 6 months?  {BJYNWGNF:62130}  LIVING ENVIRONMENT: Lives with: {OPRC lives with:25569::"lives with their family"} Lives in: {Lives in:25570}  PLOF:  Level of assistance: {QMVHQIO:96295} Employment: {SLPemployment:25674}  PATIENT GOALS: ***  OBJECTIVE:  Note: Objective measures were completed at Evaluation unless otherwise noted.  DIAGNOSTIC FINDINGS: ***  COGNITION: Overall cognitive status: {cognition:24006} Areas of impairment:  {cognitiveimpairmentslp:27409} Functional deficits: ***  AUDITORY COMPREHENSION: Overall auditory comprehension: {IMPAIRED:25374} YES/NO questions: {IMPAIRED:25374} Following directions: {IMPAIRED:25374} Conversation: {SLP conversation:25430} Interfering components: {SLP interfering components:25431} Effective technique: {SLP effective technique:25432}  READING COMPREHENSION: {SLPreadingcomprehension:27140}  EXPRESSION: {SLP EXPRESSION:25433}  VERBAL EXPRESSION: Level of generative/spontaneous verbalization: {SLP level of generative/spontaneious verbalization:25435} Automatic speech: {SLP ATOMIC SPEECH:25434}  Repetition: {SLPrepetion:27212} Naming: {SLPnaming:27214} Pragmatics: {slppragmatics:27216} Comments: *** Interfering components: {SLP INTERFERING COMPONENTS:25436} Effective technique: {SLP EFFECTIVE TECHNIQUE:25437} Non-verbal means of communication: {SLP non verbal means of communication:25438}  WRITTEN EXPRESSION: Dominant hand: {RIGHT/LEFT:20294} Written expression: {slpwrittenexp:27209}  MOTOR SPEECH: Overall motor speech: {slpimpaired:27210} Level of impairment: {SLP level of impairment:25441} Respiration:  {respbreathing:27195} Phonation: {SLP phonation:25439} Resonance: {SLP resonance:25440} Articulation: {SLParticulation:27218} Intelligibility: {SLP Intelligible:25442} Motor planning: {slpmotorspeecherrors:27220} Motor speech errors: {SLP motor speech errors:25443} Interfering components: {SLP Interfering components (MS):25444} Effective technique: {SLP effective technique (MS):25445}  ORAL MOTOR EXAMINATION: Overall status: {OMESLP2:27645} Comments: ***  STANDARDIZED ASSESSMENTS: QAB: {severity:27093}  PATIENT REPORTED OUTCOME MEASURES (PROM): Communication Participation Item Bank: ***                                                                                                                             TREATMENT DATE:  06/08/23: eval only   PATIENT EDUCATION: Education details: POC Person educated: {Person educated:25204} Education method: {Education Method:25205} Education comprehension: {Education Comprehension:25206}   GOALS: Goals reviewed with patient? Yes  SHORT TERM GOALS: Target date: 07/06/2023  *** Baseline: Goal status: INITIAL  2.  *** Baseline:  Goal status: INITIAL  3.  *** Baseline:  Goal status: INITIAL  4.  *** Baseline:  Goal status: INITIAL  5.  *** Baseline:  Goal status: INITIAL  6.  *** Baseline:  Goal status: INITIAL  LONG TERM GOALS: Target date: ***  *** Baseline:  Goal status: INITIAL  2.  *** Baseline:  Goal status: INITIAL  3.  *** Baseline:  Goal status: INITIAL  4.  *** Baseline:  Goal status: INITIAL  5.  *** Baseline:  Goal status: INITIAL  6.  *** Baseline:  Goal status: INITIAL  ASSESSMENT:  CLINICAL IMPRESSION: Patient is a 58 y.o. F who was seen today for aphasia and dysarthria evaluation secondary to stroke.   OBJECTIVE IMPAIRMENTS: include {SLPOBJIMP:27107}. These impairments are limiting patient from {SLPLIMIT:27108}. Factors affecting potential to achieve goals and functional  outcome are {SLP factors:25450}. Patient will benefit from skilled SLP services to address above impairments and improve overall function.  REHAB POTENTIAL: {rehabpotential:25112}  PLAN:  SLP FREQUENCY: {rehab frequency:25116}  SLP DURATION: {rehab duration:25117}  PLANNED INTERVENTIONS: {SLP treatment/interventions:25449}  Check all possible CPT codes: {cptcodes:24818}    Check all conditions that are expected to impact treatment: {Conditions expected to impact treatment:28273}   If treatment provided at initial evaluation, no treatment charged due to lack of authorization.     Tamar Fairly, CCC-SLP 06/07/2023, 12:00 PM

## 2023-06-08 ENCOUNTER — Ambulatory Visit: Payer: Medicaid Other | Attending: Neurology

## 2023-06-15 ENCOUNTER — Inpatient Hospital Stay: Payer: Self-pay | Admitting: Adult Health

## 2023-06-17 ENCOUNTER — Encounter: Payer: Self-pay | Admitting: Family Medicine

## 2023-06-20 ENCOUNTER — Ambulatory Visit: Payer: Self-pay | Admitting: Family Medicine

## 2023-06-21 ENCOUNTER — Ambulatory Visit: Payer: Medicaid Other | Attending: Internal Medicine | Admitting: Internal Medicine

## 2023-06-21 ENCOUNTER — Ambulatory Visit: Payer: Medicaid Other | Admitting: Student

## 2023-06-21 ENCOUNTER — Encounter: Payer: Self-pay | Admitting: Internal Medicine

## 2023-06-21 VITALS — BP 114/76 | HR 78 | Ht 68.0 in | Wt 198.8 lb

## 2023-06-21 DIAGNOSIS — R002 Palpitations: Secondary | ICD-10-CM | POA: Diagnosis not present

## 2023-06-21 DIAGNOSIS — E0590 Thyrotoxicosis, unspecified without thyrotoxic crisis or storm: Secondary | ICD-10-CM

## 2023-06-21 DIAGNOSIS — Z79899 Other long term (current) drug therapy: Secondary | ICD-10-CM

## 2023-06-21 DIAGNOSIS — I420 Dilated cardiomyopathy: Secondary | ICD-10-CM

## 2023-06-21 NOTE — Patient Instructions (Signed)
Medication Instructions:  Your physician recommends that you continue on your current medications as directed. Please refer to the Current Medication list given to you today.  *If you need a refill on your cardiac medications before your next appointment, please call your pharmacy*   Lab Work: TSH, Free T3 and Free T4  If you have labs (blood work) drawn today and your tests are completely normal, you will receive your results only by: MyChart Message (if you have MyChart) OR A paper copy in the mail If you have any lab test that is abnormal or we need to change your treatment, we will call you to review the results.   Testing/Procedures: Echo in 6 months prior to appt with Francis Dowse, PA-C Your physician has requested that you have an echocardiogram. Echocardiography is a painless test that uses sound waves to create images of your heart. It provides your doctor with information about the size and shape of your heart and how well your heart's chambers and valves are working. This procedure takes approximately one hour. There are no restrictions for this procedure. Please do NOT wear cologne, perfume, aftershave, or lotions (deodorant is allowed). Please arrive 15 minutes prior to your appointment time.  Please note: We ask at that you not bring children with you during ultrasound (echo/ vascular) testing. Due to room size and safety concerns, children are not allowed in the ultrasound rooms during exams. Our front office staff cannot provide observation of children in our lobby area while testing is being conducted. An adult accompanying a patient to their appointment will only be allowed in the ultrasound room at the discretion of the ultrasound technician under special circumstances. We apologize for any inconvenience.    Follow-Up: At [facility], you and your health needs are our priority.  As part of our continuing mission to provide you with exceptional heart care, we have  created designated Provider Care Teams.  These Care Teams include your primary Cardiologist (physician) and Advanced Practice Providers (APPs -  Physician Assistants and Nurse Practitioners) who all work together to provide you with the care you need, when you need it.  We recommend signing up for the patient portal called "MyChart".  Sign up information is provided on this After Visit Summary.  MyChart is used to connect with patients for Virtual Visits (Telemedicine).  Patients are able to view lab/test results, encounter notes, upcoming appointments, etc.  Non-urgent messages can be sent to your provider as well.   To learn more about what you can do with MyChart, go to [email or web address].    Your next appointment:   6 months with Francis Dowse

## 2023-06-21 NOTE — Progress Notes (Signed)
Patient Care Team: [name], MD as PCP - General (Family Medicine) [name], MD as PCP - Cardiology (Cardiology) [name], MD as PCP - Electrophysiology (Cardiology)   HPI  Chelsea Branch is a 58 y.o. 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 She had recovery of LV function most recently in 2008.   She has significant hypertension.   She was rehospitalized 3/15 for acute on chronic congestive heart failure. She was treated with IV diuresis. Echocardiogram demonstrated EF 25-30%.   Echocardiogram 8/15 demonstrated normalization of LV systolic function  Inteval hospitalization 11/24 >> strokes -->> TNK  multiplle lesions  Significant recovery. Echocardiogram (See Below)  TEE had to be aborted because of apnea.  A bubble study was suggested  Left ventricular dysfunction was noted.  Also intercurrently diagnosed with hypothyroidism with methimazole started 12/24. Struggling to sleep.  Modest exercise intolerance and dyspnea.  No edema.  Two-pillow orthopnea, occasional nocturnal dyspnea.  No syncope   DATE TEST EF   2008 Echo  Normal    1/17 Cath Normal CA   1/17 Echo  25-30 %   1/18 Echo  30-35%   2/20  Echo    60-65%   4/23 Echo  65%   11/24 Echo  30-35% Severe left atrial enlargement  1/25 Echo  30-35%    Date Cr K Hgb TSH   10/18 0.83 3.5     5/21 1.03 3.7     12/24 0.8 4.1 14.3 0.130         Past Medical History:  Diagnosis Date   Abnormal thyroid blood test 03/15/2017   Abnormal uterine bleeding    Alopecia    Anemia    Angina pectoris with normal coronary arteriogram (HCC) 2017   Had + Troponin c/w ? 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 iniatially ?2/2 peripartum in 1994 - improved by 2008 then worsening EF in 2011 back down to EF 25-30%. b. echo 01/21/14 showed mod LVH, [name]; 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   Peripartum cardiomyopathy 1994   Sleep apnea 2015   CPAP 12/2013   Stroke (HCC) 2011   Systolic and diastolic CHF, acute on chronic (HCC) 06/14/2015   Tobacco abuse     Past Surgical History:  Procedure Laterality Date   CARDIAC CATHETERIZATION N/A 06/16/2015   Procedure: Left Heart Cath and Coronary Angiography;  Surgeon: Corky Crafts, MD;  Location: [facility] INVASIVE CV LAB;  Service: Cardiovascular;  Laterality: N/A;   CESAREAN SECTION  1992  1994   LOOP RECORDER IMPLANT  ~ 2000   TIBIAL TUBERCLERPLASTY     TRANSESOPHAGEAL ECHOCARDIOGRAM (CATH LAB) N/A 04/20/2023   Procedure: TRANSESOPHAGEAL ECHOCARDIOGRAM;  Surgeon: [name], MD;  Location: [facility] INVASIVE CV LAB;  Service: Cardiovascular;  Laterality: N/A;   TUBAL LIGATION  1994    Current Outpatient Medications  Medication Sig Dispense Refill   acetaminophen (TYLENOL) 500  MG tablet Take 1,000 mg by mouth as needed for mild pain (pain score 1-3) or moderate pain (pain score 4-6).     albuterol (VENTOLIN HFA) 108 (90 Base) MCG/ACT inhaler Inhale 2 puffs into the lungs every 4 (four) hours as needed for wheezing or shortness of breath. 18 g 0   aspirin 81 MG chewable tablet Chew 1 tablet (81 mg total) by mouth daily. 30 tablet 1   atorvastatin (LIPITOR) 40 MG tablet Take 1 tablet (40 mg total) by mouth daily. 90 tablet 1   fluticasone (FLONASE) 50 MCG/ACT nasal spray Place 2 sprays into both nostrils daily. 9.9 mL 2   methimazole (TAPAZOLE) 5 MG tablet Take 1 tablet (5 mg total) by mouth 2 (two) times daily. 60  tablet 2   metoprolol succinate (TOPROL-XL) 50 MG 24 hr tablet Take 1 tablet (50 mg total) by mouth daily. 90 tablet 1   Polyethylene Glycol 4500 POWD 17 g by Does not apply route daily at 6 (six) AM. Dissolve 1 capful of powder in one 8 ounce glass of water. Repeat daily or as needed to soften stool (Patient taking differently: 17 g by Does not apply route daily as needed (constipation).) 500 g 0   potassium chloride SA (KLOR-CON M20) 20 MEQ tablet Take 2 tablets (40 mEq total) by mouth daily. 180 tablet 1   sacubitril-valsartan (ENTRESTO) 24-26 MG Take 1 tablet by mouth 2 (two) times daily. 180 tablet 1   spironolactone (ALDACTONE) 25 MG tablet Take 0.5 tablets (12.5 mg total) by mouth daily. 90 tablet 1   torsemide (DEMADEX) 20 MG tablet Take 1 tablet (20 mg total) by mouth daily. 90 tablet 1   No current facility-administered medications for this visit.    Allergies  Allergen Reactions   Ace Inhibitors Cough    Review of Systems negative except from HPI and PMH  Physical Exam BP 114/76   Pulse 78   Ht 5\' 8"  (1.727 m)   Wt 198 lb 12.8 oz (90.2 kg)   SpO2 96%   BMI 30.23 kg/m  Well developed and well nourished in no acute distress HENT normal Neck supple with JVP-flat Clear Regular rate and rhythm, no  gallop No murmur Abd-soft with active BS No Clubbing cyanosis  edema Skin-warm and dry A & Oriented  Grossly normal sensory and motor function  ECG sinus at 78 Interval 16/10/40 LVH with repolarization abnormalities     Assessment and  Plan Strokes  multiple  Rx TNK  Nonischemic cardiomyopathy recurrent  Congestive heart failure chronic  Hypertension-resolved  Obstructive sleep apnea AHI 18   Hyperthyroidism  treated  Palpitations given that she is having    Left ventricular dysfunction persists.  If it continues, would be appropriate to consider primary prevention ICD. Brief review of the literature identifies recovery of left ventricular dysfunction  patients with hyperthyroidism to occur in the majority.  A paper from 2013 includes people whose mean TSH has ranged from 0.14--0.08 so it is reasonable that we would give her 6 months to see what happens for left ventricular function.  Will recheck thyroid status today and she is supposed to follow-up with her primary provider next month so these data will be available.  For now we will continue GDMT . Event recorder failed to demonstrate atrial fibrillation.  Will hold off on implantation of a loop recorder for atrial fibrillation diagnosis as we await the decision regarding device implantation--intervening on Linq identified atrial fibrillation is not clearly associated with  improved outcomes so we have the privilege of waiting

## 2023-06-22 LAB — TSH: TSH: 1.32 u[IU]/mL (ref 0.450–4.500)

## 2023-06-22 LAB — T4, FREE: Free T4: 1.12 ng/dL (ref 0.82–1.77)

## 2023-06-22 LAB — T3, FREE: T3, Free: 3.1 pg/mL (ref 2.0–4.4)

## 2023-06-23 DIAGNOSIS — M5416 Radiculopathy, lumbar region: Secondary | ICD-10-CM | POA: Diagnosis not present

## 2023-07-04 NOTE — Progress Notes (Signed)
Established Patient Office Visit  Subjective   [name], female    DOB: [birth year]  Age: 58 y.o. MRN: [id number]  Chief Complaint  Patient presents with   Hospitalization Follow-up    Patient is having problem with pain in legs and walking      58 y.o.F with HTN CHF OSA Copd  10/06/22 Not seen since 09/2021 Multiple ED visits since Today the patient is complaining of bodyaches in both legs difficulty sleeping at night severe fatigue during the day shortness of breath and wheezing mild dry cough extreme fatigue difficulty with memory.  Patient had a prior diagnosis of sleep apnea and a sleep study was done in 2015.  Patient was trialed on CPAP but could not tolerate the mask fit..  Patient also complains of lower extremity pain itching in the ears scalp irritation with alopecia. The patient has a prior history of nonischemic cardiomyopathy with hyperlipidemia NSTEMI and nonobstructive coronary disease 2017. The most recent note by cardiology is documented as below   Cards 10/04/22: Chelsea Branch is a 58 y.o. 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   Since last being seen in our clinic the patient reports fatigue and malaise for approx 6-8 weeks. This has also been associated with generalized body aches for the past month. She denies any inciting factors such as acute illness or tick bite. She has not seen other providers about this yet. She has been taking her medications as directed. She gets some relief from tylenol. Denies chest pain, dyspnea, vomiting, dizziness, syncope, edema, weight gain, or early satiety.    Review of systems complete and found to be negative unless listed in HPI.    Studies Reviewed:    EKG is ordered today. Personal review shows NSR at 74 bpm   Echo 08/2021 LVEF 60-65%, Grade 1 DD   Physical Exam:   VS:  BP 134/88   Pulse 74   Ht 5\' 8"  (1.727 m)   Wt 202 lb 12.8 oz (92 kg)   SpO2 98%   BMI 30.84 kg/m    Wt Readings from Last 3 Encounters: 10/04/22 202 lb 12.8 oz (92 kg) 10/30/21 210 lb (95.3 kg) 09/23/21 207 lb (93.9 kg)    Electrophysiology Office Note:  Date:  10/04/2022  ID:  [name], DOB 12/08/1965, MRN [id number]   Primary Cardiologist: Sherryl Manges, MD Electrophysiologist: Sherryl Manges, MD     History of Present Illness:  Chelsea Branch is a 58 y.o. 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   Since last being seen in our clinic the patient reports fatigue and malaise for approx 6-8 weeks. This has also been associated with generalized body aches for the past month. She denies any inciting factors such as acute illness or tick bite. She has not seen other providers about this yet. She has been taking her medications as directed. She gets some relief from tylenol. Denies chest pain, dyspnea, vomiting, dizziness, syncope, edema, weight gain, or early satiety.     ASSESSMENT AND PLAN:    NICM HFpEF EF 60-65% 08/2021 Volume status stable on exam NYHA II-III symptoms   HTN Stable on current regimen    Fatigue Generalized weakness Body aches For approx 6-8 weeks with no  clear inciting factor.  Denies acute illness or any known contacts with ticks.  Has not followed up with PCP. Recommended close visit.  Will check BMET, CBC, TSH today. Will also update echo (which she understands would not explain aches)   Follow up with Dr. Graciela Husbands in 6 months   Signed, [name], PA-C    The patient had thyroid function checked was normal blood count metabolic panel was normal and this studies was just done this week  02-03-23 Patient is seen in return follow-up she is homeless at this time she sleeps in her car some  she is having increased back and leg pain with this.  She does have an IUD in place and has pelvic and abdominal pain.  She needs follow-up at [facility].  She works in a [employer] from 4:30 AM until about 3 PM every day.  He has hypertension today on arrival has been told blood pressure.  07/06/23 The patient was last seen in May of this year and in the interval in November had a stroke with left cerebellar hemispheric aspects and then followed up with my partner Dr. No one in December as below.  The patient had initial physical therapy evaluation they said nothing need to be done.  Predominant area of the left cerebellar hemisphere was involved along with multiple other areas.  She continues to have balance difficulty and is falling frequently.  Another issues she has had hyperthyroid in the past and maintains Tapazole thyroid function was recently normal.  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  She has persisting decreased left ventricular function and consideration for an ICD has been made.  They try to put a Zio patch on but the patch irritated her skin could not get a reading.  Patient has significant balance difficulty with falls.  She agrees to and did receive the pneumonia vaccine today. Below is the note from December from Dr. Bea Graff 04/2023 Chelsea Branch is a 58 y.o. 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 She was hospitalized from 04/11/2023 through 04/13/2023 after she had presented from work with symptoms at work which progressed during her ED course. Imaging revealed acute infarcts in the right occipital lobe, posterior aspect of left insula, left cerebellar hemisphere, possible tiny acute infarction in the left parietal lobe, trace  petechial hemorrhage in the medial aspect of the right.  She was treated with TN K. Echo revealed EF of 30 to 35%, moderately decreased LVH with mild concentric LVH, grade 3 DD, severely dilated LA. She was treated with Plavix and aspirin for 3 weeks and after that to continue monotherapy with aspirin. Plan was to perform TEE outpatient.   Interval History:   She presents for follow-up. She denies any weakness. She has started physical therapy and reports that her mobility is good, but she notices some balance issues when navigating stairs. Speech therapy is scheduled to start soon.   She also reports blurry vision that started about two weeks ago. She denies any history of diabetes, which could cause such a symptom. She is concerned about the impact of the stroke on her vision.  Cerebrovascular Accident (Stroke) Recent stroke with residual speech difficulty and mild balance issues. No motor weakness. Currently undergoing  physical therapy and scheduled for speech therapy. -Continue current rehabilitation plan. On dual antiplatelet therapy (Aspirin and Plavix) post-stroke. Discussed the risk of bleeding with prolonged dual therapy. -Continue Aspirin and Plavix for three weeks post-discharge (until 06/03/2023), then discontinue Plavix and continue Aspirin indefinitely. -Follow-up with neurology   Hyperlipidemia On statin therapy. -Continue current lipid-lowering therapy.   NICM On multiple antihypertensive medications. -Continue current antihypertensive therapy.   Prediabetes Discussed the importance of diet and exercise in preventing progression to diabetes. -Last A1c was 6.3 -Maintain healthy diet and regular exercise.   Possible Hyperthyroidism Recent abnormal thyroid function test. -Repeat thyroid function test to confirm diagnosis.   Visual Disturbance Recent onset of blurry vision, possibly related to recent stroke. -Monitor vision. If worsening, refer to ophthalmology.    Medication Access Patient reported difficulty affording medications. -Involve case manager to assist with medication access.   Follow-up Upcoming appointments with cardiologist and stroke specialist. -Attend scheduled appointments.     Close a recent echo bubble study Echo 05/2023 1. Agitated saline contrast bubble study was negative, with no evidence of any interatrial shunt. 2. Left ventricular ejection fraction by 3D volume is 34 % . The left ventricle demonstrates global hypokinesis. The left ventricular internal cavity size was moderately dilated. Left ventricular diastolic parameters are consistent with Grade I diastolic dysfunction ( impaired relaxation) . 3. Mild to moderate mitral valve regurgitation.  Pressure elevated on arrival 140/94      Review of Systems  Constitutional:  Negative for chills, diaphoresis, fever, malaise/fatigue and weight loss.  HENT:  Negative for congestion, ear pain, hearing loss, nosebleeds, sore throat and tinnitus.   Eyes:  Positive for blurred vision and redness. Negative for photophobia.  Respiratory:  Negative for cough, hemoptysis, sputum production, shortness of breath, wheezing and stridor.   Cardiovascular:  Negative for chest pain, palpitations, orthopnea, claudication, leg swelling and PND.  Gastrointestinal:  Negative for abdominal pain, blood in stool, constipation, diarrhea, heartburn, nausea and vomiting.  Genitourinary:  Negative for dysuria, flank pain, frequency, hematuria and urgency.  Musculoskeletal:  Positive for falls. Negative for back pain, joint pain, myalgias and neck pain.  Skin:  Negative for itching and rash.  Neurological:  Positive for headaches. Negative for dizziness, tingling, tremors, sensory change, speech change, focal weakness, seizures, loss of consciousness and weakness.       Imbalance with frequent falls  Endo/Heme/Allergies:  Negative for environmental allergies and polydipsia. Does not bruise/bleed easily.   Psychiatric/Behavioral:  Negative for depression, memory loss, substance abuse and suicidal ideas. The patient is not nervous/anxious and does not have insomnia.       Objective:     BP (!) 140/94 (BP Location: Right Arm, Patient Position: Sitting, Cuff Size: Normal)   Pulse 68   Wt 206 lb 6.4 oz (93.6 kg)   SpO2 98%   BMI 31.38 kg/m    Physical Exam Vitals reviewed.  Constitutional:      Appearance: Normal appearance. She is well-developed. She is obese. She is not diaphoretic.  HENT:     Head: Normocephalic and atraumatic.     Right Ear: Tympanic membrane and external ear normal. There is no impacted cerumen.     Left Ear: Tympanic membrane and external ear normal. There is no impacted cerumen.     Nose: Nose normal. No nasal deformity, septal deviation, mucosal edema or rhinorrhea.     Right Sinus: No maxillary sinus tenderness or frontal sinus tenderness.     Left Sinus: No maxillary sinus  tenderness or frontal sinus tenderness.     Mouth/Throat:     Mouth: Mucous membranes are moist.     Pharynx: Oropharynx is clear. No oropharyngeal exudate.  Eyes:     General: No scleral icterus.       Right eye: No discharge.        Left eye: No discharge.     Pupils: Pupils are equal, round, and reactive to light.     Comments: Eyes are protuberant and there is conjunctival lateral  Neck:     Thyroid: No thyromegaly.     Vascular: No carotid bruit or JVD.     Trachea: Trachea normal. No tracheal tenderness or tracheal deviation.  Cardiovascular:     Rate and Rhythm: Normal rate and regular rhythm.     Chest Wall: PMI is not displaced.     Pulses: Normal pulses. No decreased pulses.     Heart sounds: Normal heart sounds, S1 normal and S2 normal. Heart sounds not distant. No murmur heard.    No systolic murmur is present.     No diastolic murmur is present.     No friction rub. No gallop. No S3 or S4 sounds.  Pulmonary:     Effort: No tachypnea, accessory muscle usage or  respiratory distress.     Breath sounds: No stridor. No decreased breath sounds, wheezing, rhonchi or rales.  Chest:     Chest wall: No tenderness.  Abdominal:     General: Bowel sounds are normal. There is no distension.     Palpations: Abdomen is soft. Abdomen is not rigid.     Tenderness: There is no abdominal tenderness. There is no guarding or rebound.  Musculoskeletal:        General: Normal range of motion.     Cervical back: Normal range of motion and neck supple. No edema, erythema or rigidity. No muscular tenderness. Normal range of motion.  Lymphadenopathy:     Head:     Right side of head: No submental or submandibular adenopathy.     Left side of head: No submental or submandibular adenopathy.     Cervical: No cervical adenopathy.  Skin:    General: Skin is warm and dry.     Coloration: Skin is not pale.     Findings: No rash.     Nails: There is no clubbing.  Neurological:     Mental Status: She is alert and oriented to person, place, and time.     Sensory: No sensory deficit.     Coordination: Coordination abnormal.     Gait: Gait abnormal.  Psychiatric:        Speech: Speech normal.        Behavior: Behavior normal.      No results found for any visits on 07/06/23.     The ASCVD Risk score (Arnett DK, et al., 2019) failed to calculate for the following reasons:   Risk score cannot be calculated because patient has a medical history suggesting prior/existing ASCVD    Assessment & Plan:   Problem List Items Addressed This Visit       Cardiovascular and Mediastinum   Essential hypertension (Chronic)   Blood pressure not yet at goal continue current medication      Chronic combined systolic and diastolic CHF (congestive heart failure) (HCC) (Chronic)   Relevant Orders   Comprehensive metabolic panel   Nonischemic dilated cardiomyopathy (HCC)   Management per cardiology needs labs to be checked  Endocrine   Hyperthyroidism   History of  hyperthyroidism thyroid function recently normal patient has evidence of thyroid eye disease continue Tapazole      Thyroid eye disease - Primary   Refer to ophthalmology      Relevant Orders   Ambulatory referral to Ophthalmology   CBC with Differential/Platelet     Other   HLD (hyperlipidemia) (Chronic)   Relevant Orders   Lipid panel   Homelessness (Chronic)   Patient continues to suffer from homelessness she goes from house to house couch surfing she also has transportation barrier      History of cerebellar stroke   History of cerebellar stroke still has imbalance with frequent falls refer back to neurology and physical therapy I provided the patient a cane and rollator      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)   Improving with speech therapy      History of stroke with residual effects   Referral to neurology and physical therapy obtain rollator and cane      Relevant Orders   CBC with Differential/Platelet   For home use only DME Other see comment   Ambulatory referral to Physical Therapy   For home use only DME 4 wheeled rolling walker with seat ([id number])   Ambulatory referral to Neurology   Other Visit Diagnoses       Frequent falls       Relevant Orders   For home use only DME Other see comment   Ambulatory referral to Physical Therapy   For home use only DME 4 wheeled rolling walker with seat ([id number])     Encounter for screening mammogram for malignant neoplasm of breast       Relevant Orders   MM DIGITAL SCREENING BILATERAL     Colon cancer screening       Relevant Orders   Fecal occult blood, imunochemical     Need for Streptococcus pneumoniae vaccination       Relevant Orders   Pneumococcal conjugate vaccine 20-valent (Completed)     Pain       Relevant Medications   acetaminophen (TYLENOL) tablet 650 mg (Completed)        35 minutes spent due to assessing multiple problems  Return in about 4 months  (around 11/03/2023) for htn, chronic conditions, thyroid.    Shan Levans, MD

## 2023-07-05 ENCOUNTER — Ambulatory Visit: Payer: Medicaid Other | Attending: Neurology | Admitting: Speech Pathology

## 2023-07-05 ENCOUNTER — Encounter: Payer: Self-pay | Admitting: Speech Pathology

## 2023-07-05 DIAGNOSIS — I639 Cerebral infarction, unspecified: Secondary | ICD-10-CM | POA: Insufficient documentation

## 2023-07-05 DIAGNOSIS — R4701 Aphasia: Secondary | ICD-10-CM | POA: Insufficient documentation

## 2023-07-05 NOTE — Therapy (Deleted)
[facility] [address] [place], [place], [address] Phone: [phone]   Fax:  [phone]  Patient Details  Name: Chelsea Branch MRN: [id number] Date of Birth: Oct 24, 1965 Referring Provider:  Micki Riley, MD  Encounter Date: 07/05/2023   Roylene Reason, Student-SLP 07/05/2023, 1:06 PM  [facility] [address] [place], [place], [address] Phone: [phone]   Fax:  [phone]

## 2023-07-05 NOTE — Therapy (Signed)
OUTPATIENT SPEECH LANGUAGE PATHOLOGY APHASIA EVALUATION   Patient Name: Chelsea Branch MRN: [id number] DOB:[birth year], 58 y.o., female Today's Date: 07/05/2023  PCP: [name], MD REFERRING PROVIDER: Micki Riley, MD  END OF SESSION:  End of Session - 07/05/23 1309     Visit Number 1    Number of Visits 13    Date for SLP Re-Evaluation 09/27/23    SLP Start Time 1305    SLP Stop Time  1349    SLP Time Calculation (min) 44 min    Activity Tolerance Patient tolerated treatment well             Past Medical History:  Diagnosis Date   Abnormal thyroid blood test 03/15/2017   Abnormal uterine bleeding    Alopecia    Anemia    Angina pectoris with normal coronary arteriogram (HCC) 2017   Had + Troponin c/w ? 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 iniatially ?2/2 peripartum in 1994 - improved by 2008 then worsening EF in 2011 back down to EF 25-30%. b. echo 01/21/14 showed mod LVH, [name]; 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   Peripartum cardiomyopathy 1994   Sleep apnea 2015   CPAP 12/2013   Stroke (HCC) 2011   Systolic and diastolic CHF, acute on chronic (HCC) 06/14/2015   Tobacco abuse    Past Surgical History:  Procedure Laterality Date   CARDIAC CATHETERIZATION N/A 06/16/2015   Procedure: Left Heart Cath and Coronary Angiography;  Surgeon: Corky Crafts, MD;  Location: [facility] INVASIVE CV LAB;  Service: Cardiovascular;  Laterality: N/A;   CESAREAN SECTION  1992  1994    LOOP RECORDER IMPLANT  ~ 2000   TIBIAL TUBERCLERPLASTY     TRANSESOPHAGEAL ECHOCARDIOGRAM (CATH LAB) N/A 04/20/2023   Procedure: TRANSESOPHAGEAL ECHOCARDIOGRAM;  Surgeon: [name], MD;  Location: [facility] INVASIVE CV LAB;  Service: Cardiovascular;  Laterality: N/A;   TUBAL LIGATION  1994   Patient Active Problem List   Diagnosis Date Noted   Prediabetes 04/28/2023   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) 04/28/2023   Cerebellar stroke, acute (HCC) 04/13/2023   History of stroke 04/11/2023   Liletta IUD (intrauterine device) in place since 12/15/2015 04/06/2023   Bacterial vaginitis 01/13/2023   External otitis of both ears due to fungus 10/07/2022   Chronic pain syndrome 12/28/2021   Primary osteoarthritis of both knees 12/28/2021   Spinal stenosis of lumbar region 07/21/2021   Lumbar radiculitis 07/21/2021   COPD (chronic obstructive pulmonary disease) (HCC) 07/21/2021   Mechanical complication due to intrauterine contraceptive device 07/21/2021   Arthropathy of lumbar facet joint 03/14/2019   Degenerative disc disease at L5-S1 level 10/10/2018   Hyperthyroidism 04/26/2016   Abnormal uterine bleeding 04/06/2016   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   History of non-ST elevation myocardial infarction (NSTEMI) 06/14/2015   Homelessness 09/16/2014   Vitamin D deficiency 01/29/2014   OSA (obstructive sleep apnea) 01/08/2014   Chronic combined  systolic and diastolic CHF (congestive heart failure) (HCC) 08/14/2013   Essential hypertension    Fibroids 08/09/2012   Menorrhagia 05/08/2012   Female pattern hair loss    HLD (hyperlipidemia) 06/08/2010   CEREBRAL ANEURYSM 06/08/2010   History of cardiovascular disorder 06/08/2010    ONSET DATE: 04/13/2023 (referral date)   REFERRING DIAG: I63.9 (ICD-10-CM) - Cerebellar stroke, acute (HCC) I63.9 (ICD-10-CM) - Acute cerebral infarction  THERAPY DIAG:  Aphasia  Rationale for Evaluation  and Treatment: Rehabilitation  SUBJECTIVE:   SUBJECTIVE STATEMENT: Pt reports challenges with communication regarding understanding others and finding desired word in conversation.  Pt accompanied by: self  PERTINENT HISTORY: "[name] is a 58 yo female presenting to ED 11/18 with expressive aphasia, R UE weakness, and confusion. MRI Brain with acute infarcts in the acute occipital lobe, posterior aspect of L insula, and L cerebellar hemisphere. TNK administered. PMH includes prior CVA (2011), OSA, tobacco use, chronic combined CHF, HTN, NSTEMI (2017), NIDCM"   PAIN:  Are you having pain? Yes: Pain location: Legs Pain description: ache  FALLS: Has patient fallen in last 6 months?  Yes, Number of falls: 2 , SLP requesting PT referral for c/o falls and LE weakness/pain, balance issues   LIVING ENVIRONMENT: Lives with: lives with their family, reporting housing instability ("I bounce around")  PLOF:  Level of assistance: Independent with ADLs, Independent with IADLs Employment: Full-time employment - has not returned to work  PATIENT GOALS: be able to speak to friends and family   OBJECTIVE:  Note: Objective measures were completed at Evaluation unless otherwise noted.  DIAGNOSTIC FINDINGS:  Acute stay SLP evaluation 04/12/2023 Clinical Impression   Pt reports living with her significant other and working a Sports coach job at a [employer]. She presents with a fluent aphasia and mild receptive deficits. Pt oriented x4. She appears to use carrier phrases to compensate for expressive deficits during attempts at self-correction during spontaneous speech. She was able to accurately recall 3/5 novel objects after a delay. Her speech is characterized by phonemic and semantic paraphasias. Although writing simultaneously with speech does seem to increase intelligibility, she is perseverative during both verbal and written output. Repetition at the word level is intact, although pt has  increased difficulty as complexity progresses to the phrase level. She was able to follow all one- and two-step basic commands, but note increased difficulty when temporal elements were added. During a confrontation naming task, pt benefitted from sentence completion cues. At times, pt has good awareness of deficits but this is not always consistent. Overall, pt may benefit from ongoing SLP f/u to address deficits listed above. Will follow acutely and recommend intensive f/u upon d/c.   MR BRAIN WO CONTRAST 04/12/2023 IMPRESSION: Acute infarcts in the right occipital lobe, along the posterior aspect of the left insula, and in the left cerebellar hemisphere with additional tiny foci of possible acute infarction in the left parietal lobe. Findings are suspicious for cardioembolic etiology. Trace petechial hemorrhage in the medial aspect of the right occipital lobe and posterior left insula. No significant mass effect.  COGNITION: Overall cognitive status: Within functional limits for tasks assessed  AUDITORY COMPREHENSION: Overall auditory comprehension: Impaired: moderately complex and complex YES/NO questions: Impaired: complex Following directions: Appears intact Conversation: Complex and Moderately Complex Interfering components: attention and processing speed Effective technique: extra processing time, pausing, and repetition/stressing words  READING COMPREHENSION: Impaired: word (non-phonetic spelling)   EXPRESSION:  did not assess 2/2 time constraints   VERBAL EXPRESSION: Level  of generative/spontaneous verbalization: conversation Automatic speech: counting: intact  Repetition: Appears intact Naming: Confrontation: 26-50% Pragmatics: Appears intact Interfering components: attention Effective technique: semantic cues, sentence completion, and phonemic cues Non-verbal means of communication: N/A  WRITTEN EXPRESSION: did not assess 2/2 time  MOTOR SPEECH: Overall motor  speech: impaired Further assessment to be completed next session  ORAL MOTOR EXAMINATION: Overall status: Did not assess 2/2 time constraints   STANDARDIZED ASSESSMENTS: Quick Aphasia Battery Word comprehension 10.00  Sentence comprehension 6.67  Word finding 7.25  Grammatical construction 9.75  Speech motor programming 10.00  Repetition 9.17  Reading 7.92  QAB overall 8.38  Indicating mild impairment.   [place] Naming Test Pt correctly names 32/60 targets. WNL is 55 or above.  For error'd productions, FO4 facilitative in 35% of opportunities   PATIENT REPORTED OUTCOME MEASURES (PROM): The Communicative Participation Item Bank        Does your condition interfere with... Pt Rating   ...talking with people you know 2   ...communicating when you need to say something quickly 2   ...talking with people you do not know 3   ...communicating when you are out in your community 2   ...asking questions in a conversation 1   ....communicating in a small group of people 2   ...having a long conversation 2   ...giving detailed infomrmation 2   ...getting your turn in a fast moving conversation 2   ...trying to persuade a friend or family member to see a different point of view 2  3= Not at all; 2=A little; 1=Quite a bit; 0=Very much                                                                                                                 TREATMENT DATE:  07/05/23: Completed eval and POC.  PATIENT EDUCATION: Education details: Tour manager Person educated: Patient Education method: Medical illustrator Education comprehension: verbalized understanding and needs further education   GOALS: Goals reviewed with patient? Yes  SHORT TERM GOALS: Target date: 07/06/2023  Pt will use verbal compensations for word finding in structured task with rare min A  Baseline: no compensations for word finding  Goal status: INITIAL  2.  Pt will name 15 items in personally relevant  category with occasional min A  Baseline: mild aphasia  Goal status: INITIAL  3.  Pt will demonstrate auditory processing strategies during structured practice Baseline: 80% accuracy Y/N questions, reports challenges understanding friends/family in conversation Goal status: INITIAL  4.  Pt will complete moderately complex structured language task (sentence level) with 90% accuracy to aid in language rehabilitation Baseline: mild aphasia  Goal status: INITIAL   LONG TERM GOALS: Target date: 09/27/2023  Pt will use compensations for word finding in conversation 4/5 opportunities with rare min A Baseline: no compensations Goal status: INITIAL  2.  Pt will utilize processing compensations during 15 minute conversation in 80% of opportunities, evidenced by appropriate responses/comments Baseline: no compensations Goal status: INITIAL  3.   Pt will complete  moderately complex structured language task (discourse level) with 90% accuracy to aid in language rehabilitation Baseline: mild aphasia  Goal status: INITIAL  4.  Pt will verbalize x4 HEP activities for language Baseline: to be established  Goal status: INITIAL  ASSESSMENT:  CLINICAL IMPRESSION: Patient is a 58 y.o. F who was seen today for aphasia and dysarthria evaluation secondary to stroke. Presenting with improvements since onset in November, but ongoing mild aphasia. Pt's communication is c/b reduced comprehension with intermittent awareness (asking for repetition) and anomia marked by mazing, pausing, filler words. Pt not currently demonstrating use of any anomia strategies. Pt reports attempting participation in communicative opportunities with friends and family impacted by her aphasia. Also notable challenges reading which pt reports as onset with stroke. Not extensively evaluation this session d/t time. Mild dysarthria c/b reduced volume and articulatory imprecision. She would benefit from skilled ST to address.   OBJECTIVE  IMPAIRMENTS: include mild aphasia, expressive and receptive and dysarthria. These impairments are limiting patient from effectively communicating at home and in community. Factors affecting potential to achieve goals and functional outcome are financial resources. Patient will benefit from skilled SLP services to address above impairments and improve overall function.  REHAB POTENTIAL: Good  PLAN:  SLP FREQUENCY: 1x/week  SLP DURATION: 12 weeks  PLANNED INTERVENTIONS: Language facilitation, Cueing hierachy, Internal/external aids, Functional tasks, Multimodal communication approach, SLP instruction and feedback, Compensatory strategies, and Patient/family education  Check all possible CPT codes: 16109 - SLP treatment    Check all conditions that are expected to impact treatment: Social determinants of health   If treatment provided at initial evaluation, no treatment charged due to lack of authorization.   Graduate clinician Roylene Reason present for this session and participated in administration of therapeutic interventions under my supervision.   Maia Breslow, CCC-SLP 07/05/2023, 1:39 PM

## 2023-07-06 ENCOUNTER — Encounter: Payer: Self-pay | Admitting: Critical Care Medicine

## 2023-07-06 ENCOUNTER — Ambulatory Visit: Payer: Medicaid Other | Attending: Critical Care Medicine | Admitting: Critical Care Medicine

## 2023-07-06 VITALS — BP 140/94 | HR 68 | Wt 206.4 lb

## 2023-07-06 DIAGNOSIS — I10 Essential (primary) hypertension: Secondary | ICD-10-CM | POA: Diagnosis not present

## 2023-07-06 DIAGNOSIS — H5789 Other specified disorders of eye and adnexa: Secondary | ICD-10-CM | POA: Diagnosis not present

## 2023-07-06 DIAGNOSIS — Z1211 Encounter for screening for malignant neoplasm of colon: Secondary | ICD-10-CM

## 2023-07-06 DIAGNOSIS — I420 Dilated cardiomyopathy: Secondary | ICD-10-CM | POA: Diagnosis not present

## 2023-07-06 DIAGNOSIS — Z5900 Homelessness unspecified: Secondary | ICD-10-CM

## 2023-07-06 DIAGNOSIS — E782 Mixed hyperlipidemia: Secondary | ICD-10-CM | POA: Diagnosis not present

## 2023-07-06 DIAGNOSIS — I6930 Unspecified sequelae of cerebral infarction: Secondary | ICD-10-CM | POA: Insufficient documentation

## 2023-07-06 DIAGNOSIS — Z8673 Personal history of transient ischemic attack (TIA), and cerebral infarction without residual deficits: Secondary | ICD-10-CM | POA: Diagnosis not present

## 2023-07-06 DIAGNOSIS — Z1231 Encounter for screening mammogram for malignant neoplasm of breast: Secondary | ICD-10-CM

## 2023-07-06 DIAGNOSIS — I69322 Dysarthria following cerebral infarction: Secondary | ICD-10-CM | POA: Diagnosis not present

## 2023-07-06 DIAGNOSIS — R296 Repeated falls: Secondary | ICD-10-CM | POA: Diagnosis not present

## 2023-07-06 DIAGNOSIS — I5042 Chronic combined systolic (congestive) and diastolic (congestive) heart failure: Secondary | ICD-10-CM

## 2023-07-06 DIAGNOSIS — Z139 Encounter for screening, unspecified: Secondary | ICD-10-CM

## 2023-07-06 DIAGNOSIS — R52 Pain, unspecified: Secondary | ICD-10-CM | POA: Diagnosis not present

## 2023-07-06 DIAGNOSIS — E079 Disorder of thyroid, unspecified: Secondary | ICD-10-CM

## 2023-07-06 DIAGNOSIS — E0590 Thyrotoxicosis, unspecified without thyrotoxic crisis or storm: Secondary | ICD-10-CM | POA: Diagnosis not present

## 2023-07-06 DIAGNOSIS — I639 Cerebral infarction, unspecified: Secondary | ICD-10-CM | POA: Diagnosis not present

## 2023-07-06 DIAGNOSIS — Z23 Encounter for immunization: Secondary | ICD-10-CM | POA: Diagnosis not present

## 2023-07-06 MED ORDER — ACETAMINOPHEN 325 MG PO TABS
650.0000 mg | ORAL_TABLET | Freq: Once | ORAL | Status: AC
  Administered 2023-07-06: 650 mg via ORAL

## 2023-07-06 NOTE — Assessment & Plan Note (Signed)
-  Refer to ophthalmology

## 2023-07-06 NOTE — Patient Instructions (Signed)
Labs today No medication change Ophthalmology referral made Pneumonia vaccine given Referral back to stroke provider and physical therapy made Orders for a cane and rollator were sent

## 2023-07-06 NOTE — Assessment & Plan Note (Signed)
Patient continues to suffer from homelessness she goes from house to house couch surfing she also has transportation barrier

## 2023-07-06 NOTE — Assessment & Plan Note (Signed)
Blood pressure not yet at goal continue current medication

## 2023-07-06 NOTE — Assessment & Plan Note (Signed)
Management per cardiology needs labs to be checked

## 2023-07-06 NOTE — Addendum Note (Signed)
Addended by: Dalene Carrow I on: 07/06/2023 02:35 PM   Modules accepted: Orders

## 2023-07-06 NOTE — Assessment & Plan Note (Signed)
Improving with speech therapy.

## 2023-07-06 NOTE — Assessment & Plan Note (Signed)
Referral to neurology and physical therapy obtain rollator and cane

## 2023-07-06 NOTE — Assessment & Plan Note (Signed)
History of cerebellar stroke still has imbalance with frequent falls refer back to neurology and physical therapy I provided the patient a cane and rollator

## 2023-07-06 NOTE — Assessment & Plan Note (Signed)
History of hyperthyroidism thyroid function recently normal patient has evidence of thyroid eye disease continue Tapazole

## 2023-07-07 LAB — COMPREHENSIVE METABOLIC PANEL
ALT: 17 [IU]/L (ref 0–32)
AST: 13 [IU]/L (ref 0–40)
Albumin: 3.6 g/dL — ABNORMAL LOW (ref 3.8–4.9)
Alkaline Phosphatase: 105 [IU]/L (ref 44–121)
BUN/Creatinine Ratio: 18 (ref 9–23)
BUN: 13 mg/dL (ref 6–24)
Bilirubin Total: 0.6 mg/dL (ref 0.0–1.2)
CO2: 22 mmol/L (ref 20–29)
Calcium: 9 mg/dL (ref 8.7–10.2)
Chloride: 102 mmol/L (ref 96–106)
Creatinine, Ser: 0.73 mg/dL (ref 0.57–1.00)
Globulin, Total: 2.5 g/dL (ref 1.5–4.5)
Glucose: 90 mg/dL (ref 70–99)
Potassium: 4.2 mmol/L (ref 3.5–5.2)
Sodium: 139 mmol/L (ref 134–144)
Total Protein: 6.1 g/dL (ref 6.0–8.5)
eGFR: 95 mL/min/{1.73_m2} (ref >59)

## 2023-07-07 LAB — LIPID PANEL
Chol/HDL Ratio: 2.3 {ratio} (ref 0.0–4.4)
Cholesterol, Total: 121 mg/dL (ref 100–199)
HDL: 52 mg/dL (ref >39)
LDL Chol Calc (NIH): 55 mg/dL (ref 0–99)
Triglycerides: 68 mg/dL (ref 0–149)
VLDL Cholesterol Cal: 14 mg/dL (ref 5–40)

## 2023-07-07 LAB — CBC WITH DIFFERENTIAL/PLATELET
Basophils Absolute: 0 10*3/uL (ref 0.0–0.2)
Basos: 0 %
EOS (ABSOLUTE): 0 10*3/uL (ref 0.0–0.4)
Eos: 0 %
Hematocrit: 42.2 % (ref 34.0–46.6)
Hemoglobin: 13.9 g/dL (ref 11.1–15.9)
Immature Grans (Abs): 0 10*3/uL (ref 0.0–0.1)
Immature Granulocytes: 0 %
Lymphocytes Absolute: 2.1 10*3/uL (ref 0.7–3.1)
Lymphs: 23 %
MCH: 29 pg (ref 26.6–33.0)
MCHC: 32.9 g/dL (ref 31.5–35.7)
MCV: 88 fL (ref 79–97)
Monocytes Absolute: 0.6 10*3/uL (ref 0.1–0.9)
Monocytes: 6 %
Neutrophils Absolute: 6.4 10*3/uL (ref 1.4–7.0)
Neutrophils: 71 %
Platelets: 215 10*3/uL (ref 150–450)
RBC: 4.79 x10E6/uL (ref 3.77–5.28)
RDW: 13.4 % (ref 11.7–15.4)
WBC: 9.2 10*3/uL (ref 3.4–10.8)

## 2023-07-07 NOTE — Progress Notes (Signed)
Let pt know all labs normal: kidney liver blood  counts normal, cholesterol at goal stay on cholesterol pill

## 2023-07-08 ENCOUNTER — Telehealth: Payer: Self-pay | Admitting: *Deleted

## 2023-07-08 ENCOUNTER — Telehealth: Payer: Self-pay

## 2023-07-08 NOTE — Progress Notes (Signed)
High Risk Managed Medicaid   Management Note  Care Guide Note 07/08/2023 Name: Chelsea Branch MRN: [id number] DOB: [birth year]  [name] is a 58 y.o. year old female who sees [name], MD for primary care. I reached out to [name] by phone today to offer High Risk Managed Medicaid  services.  [name] was given information about High Risk Managed Medicaid  services today including:   The High Risk Managed Medicaid  services include support from the care team which includes your Nurse Care Manager, Clinical Social Worker, or Pharmacist.  The High Risk Managed Medicaid team is here to help remove barriers to the health concerns and goals most important to you. High Risk Managed Medicaid services are voluntary, and the patient may decline or stop services at any time by request to their care team member.   High Risk Managed Medicaid Consent Status: Patient agreed to services and verbal consent obtained.   Follow up plan:  Telephone appointment with High Risk Managed Medicaid  team member scheduled for:  2/28  Encounter Outcome:  Patient Scheduled  Gwenevere Ghazi  [facility]  [facility], [facility] Guide  Direct Dial: [phone]  Fax [phone]

## 2023-07-08 NOTE — Telephone Encounter (Signed)
-----   Message from Shan Levans sent at 07/07/2023 11:17 AM EST ----- Let pt know all labs normal: kidney liver blood  counts normal, cholesterol at goal stay on cholesterol pill

## 2023-07-08 NOTE — Telephone Encounter (Signed)
Pt was called and vm was left, Information has been sent to nurse pool.

## 2023-07-11 ENCOUNTER — Telehealth: Payer: Self-pay | Admitting: *Deleted

## 2023-07-11 NOTE — Progress Notes (Signed)
Complex Care Management Note Care Guide Note  07/11/2023 Name: Chelsea Branch MRN: [id number] DOB: 08-14-1965  [name] is a 58 y.o. year old female who is a primary care patient of [name], MD . The community resource team was consulted for assistance with Transportation Needs , Food Insecurity, and housing   SDOH screenings and interventions completed:  Yes     SDOH Interventions Today    Flowsheet Row Most Recent Value  SDOH Interventions   Food Insecurity Interventions Community Resources Provided  [employer] but will give food banks information]  Housing Interventions Community Resources Provided  [Needs housing cant get in owes Housing authority monies will mail senior housing]  Transportation Interventions SCAT (Specialized Community Area Transporation), AMB Referral  [Will complete scat application has medical transportation]        Care guide performed the following interventions: Patient provided with information about care guide support team and interviewed to confirm resource needs.  Follow Up Plan:  Care guide will outreach resources to assist patient with SCAT application on Thursday   Encounter Outcome:  Patient Visit Completed  Dione Booze  [facility] HealthPopulation Health Care Guide  Direct Dial:[phone] [phone] Website: [email or web address]

## 2023-07-12 ENCOUNTER — Encounter: Payer: Self-pay | Admitting: Adult Health

## 2023-07-12 ENCOUNTER — Ambulatory Visit (INDEPENDENT_AMBULATORY_CARE_PROVIDER_SITE_OTHER): Payer: Medicaid Other | Admitting: Adult Health

## 2023-07-12 VITALS — BP 141/94 | HR 67 | Ht 68.0 in | Wt 205.0 lb

## 2023-07-12 DIAGNOSIS — I10 Essential (primary) hypertension: Secondary | ICD-10-CM | POA: Diagnosis not present

## 2023-07-12 DIAGNOSIS — I639 Cerebral infarction, unspecified: Secondary | ICD-10-CM

## 2023-07-12 NOTE — Patient Instructions (Signed)
Your Plan:  Continue ASA daily   Blood pressure goal <130/90 Cholesterol LDL goal <70 Diabetes goal A1c <7 Monitor diet and try to exercise  Dr. Delford Field placed on order for physical therapy  Thank you for coming to see [place] at [facility]. I hope we have been able to provide you high quality care today.  You may receive a patient satisfaction survey over the next few weeks. We would appreciate your feedback and comments so that we may continue to improve ourselves and the health of our patients.

## 2023-07-12 NOTE — Progress Notes (Signed)
PATIENT: Chelsea Branch DOB: December 28, 1965  REASON FOR VISIT: follow up HISTORY FROM: patient PRIMARY NEUROLOGIST: Dr. Pearlean Brownie  Chief Complaint  Patient presents with   RM 18    Here alone for hospital follow-up for stroke. She states she hasn't been feeling good at all. She states her mobility isn't good and her speech is off. States she used to speak more clearly. Sometimes has trouble understanding or might forget something while talking. She states she has been having headaches. She has one now. She thinks it might be because she hasn't had anything to eat for breakfast. She had a bad headache when she had the stroke in November. She had 2 falls in January, hurt her knee.     HISTORY OF PRESENT ILLNESS: Today 07/12/23  Chelsea Branch is a 58 y.o. female here for hospital follow-up for Acute infarcts in the right occipital lobe, posterior aspect of left insula and left cerebellar hemisphere, possible tiny acute infarction in the left parietal lobe,trace petechial hemorrhage in the medial aspect of the right s/p TNK.returns today for follow-up.  Patient reports that she is still noticing some trouble with her speech.  She is currently in speech therapy.  She is also noticed some trouble with her gait and balance.  She has had some falls.  She states that her primary care doctor right has put in a referral to physical therapy.  She remains on aspirin daily.  On Lipitor for her cholesterol.  Her hemoglobin A1c was 6.3.  Is followed by cardiology. Wore a heart monitor- no afib.  Denies any new issues.  Returns today for follow-up.   HISTORY 58 y.o. female with PMH significant for Stroke 2011, OSA, Smoker, Chronic combined CHF, HTN, NSTEMI 2017, NIDCM who presented to the ED d/t change in mental status/"feeling off", blurred vision, headache, dry mouth while at work. Patient was able to describe all of this while in triage with no aphasia or dysarthria present and vision was improving, per notes.  Patient was taken for CT/CTA, which was negative.  At approximately 1427, patient went from talking and interacting normally with nursing staff to having expressive aphasia and a CODE STROKE was activated.  On exam at bedside, patient with RUE weakness, confusion and receptive/expressive aphasia. NIH 7    Management with thrombolytic therapy was explained to the patient, and patient's son Stann Ore over the phone, as were risks, benefits and alternatives. All questions were answered. Patient, or patient's representative, expressed understanding of the treatment plan and agreed to proceed with thrombolytic treatment.TNK given at 1502.    LKW: 1427 04/11/23 TNK given? Yes, 1502 04/11/23  Mechanical thrombectomy? No, no LVO  Risks, benefits, alternatives of IV thrombolysis were discussed and family/patient agreed to proceed. CT imaging was reviewed personally prior to IV thrombolysis administration with no evidence of bleed.   Premorbid modified Rankin scale (mRS): 0   NIH on Admission 7    HOSPITAL COURSE Acute infarcts in the right occipital lobe, posterior aspect of left insula and left cerebellar hemisphere, possible tiny acute infarction in the left parietal lobe,trace petechial hemorrhage in the medial aspect of the right s/p TNK at 1502 11/18  Etiology: Suspect cardio embolic versus cryptogenic CTA head & neck No LVO or significant stenosis   Code Stroke CT head hypodensity and loss of gray-white differentiation in the right occipital lobe, which was not evident prior exam, concerning for acute infarct. MRI acute infarcts in the right occipital lobe, posterior aspect of left  insulae and left cerebellar hemisphere, possible tiny acute infarction in the left parietal lobe,trace petechial hemorrhage in the medial aspect of the right occipital lobe and posterior left insula 2D Echo EF 30-35%, moderately decreased LVH function, w/ mild concentric LVH, Grade III Diastolic dysfunction, LA  severely dilated  TEE ordered, however patient ate prior to procedure and was encouraged to get outpatient on 11/25 LDL 69 HgbA1c 6.3 UDS negative  VTE prophylaxis - Holding for now  No antithrombotic prior to admission, will start on aspirin 81 mg daily and clopidogrel 75 mg daily for 3 weeks and then ASA alone Therapy recommendations:  Outpatient SLP and PT  Disposition: Home Hx of Stroke/TIA Prior history of stroke in 2011  Discontinued daily ASA 1-2 years ago  Continue GDMT Start on  aspirin 81 mg daily and clopidogrel 75 mg daily for 3 weeks and then ASA alone    REVIEW OF SYSTEMS: Out of a complete 14 system review of symptoms, the patient complains only of the following symptoms, and all other reviewed systems are negative.  ALLERGIES: Allergies  Allergen Reactions   Ace Inhibitors Cough    HOME MEDICATIONS: Outpatient Medications Prior to Visit  Medication Sig Dispense Refill   acetaminophen (TYLENOL) 500 MG tablet Take 1,000 mg by mouth as needed for mild pain (pain score 1-3) or moderate pain (pain score 4-6).     albuterol (VENTOLIN HFA) 108 (90 Base) MCG/ACT inhaler Inhale 2 puffs into the lungs every 4 (four) hours as needed for wheezing or shortness of breath. 18 g 0   aspirin 81 MG chewable tablet Chew 1 tablet (81 mg total) by mouth daily. 30 tablet 1   atorvastatin (LIPITOR) 40 MG tablet Take 1 tablet (40 mg total) by mouth daily. 90 tablet 1   fluticasone (FLONASE) 50 MCG/ACT nasal spray Place 2 sprays into both nostrils daily. 9.9 mL 2   methimazole (TAPAZOLE) 5 MG tablet Take 1 tablet (5 mg total) by mouth 2 (two) times daily. 60 tablet 2   metoprolol succinate (TOPROL-XL) 50 MG 24 hr tablet Take 1 tablet (50 mg total) by mouth daily. 90 tablet 1   Polyethylene Glycol 4500 POWD 17 g by Does not apply route daily at 6 (six) AM. Dissolve 1 capful of powder in one 8 ounce glass of water. Repeat daily or as needed to soften stool (Patient taking differently: 17 g by  Does not apply route daily as needed (constipation).) 500 g 0   potassium chloride SA (KLOR-CON M20) 20 MEQ tablet Take 2 tablets (40 mEq total) by mouth daily. 180 tablet 1   sacubitril-valsartan (ENTRESTO) 24-26 MG Take 1 tablet by mouth 2 (two) times daily. 180 tablet 1   spironolactone (ALDACTONE) 25 MG tablet Take 0.5 tablets (12.5 mg total) by mouth daily. 90 tablet 1   torsemide (DEMADEX) 20 MG tablet Take 1 tablet (20 mg total) by mouth daily. 90 tablet 1   No facility-administered medications prior to visit.    PAST MEDICAL HISTORY: Past Medical History:  Diagnosis Date   Abnormal thyroid blood test 03/15/2017   Abnormal uterine bleeding    Alopecia    Anemia    Angina pectoris with normal coronary arteriogram (HCC) 2017   Had + Troponin c/w ? 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 iniatially ?2/2 peripartum in 1994 - improved by 2008 then worsening EF in 2011 back down to EF 25-30%. b. echo 01/21/14 showed mod LVH, [name]; 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   Peripartum cardiomyopathy 1994   Sleep apnea 2015   CPAP 12/2013   Stroke (HCC) 2011   Systolic and diastolic CHF, acute on chronic (HCC) 06/14/2015   Tobacco abuse     PAST SURGICAL HISTORY: Past Surgical History:  Procedure Laterality Date   CARDIAC CATHETERIZATION N/A 06/16/2015   Procedure: Left Heart Cath and Coronary Angiography;  Surgeon: Corky Crafts, MD;  Location: [facility] INVASIVE CV LAB;  Service: Cardiovascular;   Laterality: N/A;   CESAREAN SECTION  1992  1994   LOOP RECORDER IMPLANT  ~ 2000   TIBIAL TUBERCLERPLASTY     TRANSESOPHAGEAL ECHOCARDIOGRAM (CATH LAB) N/A 04/20/2023   Procedure: TRANSESOPHAGEAL ECHOCARDIOGRAM;  Surgeon: [name], MD;  Location: [facility] INVASIVE CV LAB;  Service: Cardiovascular;  Laterality: N/A;   TUBAL LIGATION  1994    FAMILY HISTORY: Family History  Problem Relation Age of Onset   Cancer Maternal Grandmother        uterine   Hypertension Sister    Healthy Mother    Other Neg Hx    Heart disease Neg Hx     SOCIAL HISTORY: Social History   Socioeconomic History   Marital status: Single    Spouse name: Not on file   Number of children: 3   Years of education: 12   Highest education level: Not on file  Occupational History   Occupation: unemployed  Tobacco Use   Smoking status: Former    Current packs/day: 0.00    Average packs/day: 0.1 packs/day for 30.0 years (3.0 ttl pk-yrs)    Types: Cigarettes    Start date: 06/02/1985    Quit date: 06/03/2015    Years since quitting: 8.1   Smokeless tobacco: Never   Tobacco comments:    Pt. stated she stopped smoking a year ago. 09/29/2018  Vaping Use   Vaping status: Never Used  Substance and Sexual Activity   Alcohol use: No    Alcohol/week: 0.0 standard drinks of alcohol   Drug use: No   Sexual activity: Yes    Birth control/protection: I.U.D.    Comment: fibroids  Other Topics Concern   Not on file  Social History Narrative   Lives at home with 59 yo twins ([place] and [place])   62 yo son lives outside the home   85 yo granddaughter    Social Drivers of Health   Financial Resource Strain: High Risk (07/06/2023)   Overall Financial Resource Strain (CARDIA)    Difficulty of Paying Living Expenses: Very hard  Food Insecurity: Food Insecurity Present (07/06/2023)   Hunger Vital Sign    Worried About Running Out of Food in the Last Year: Often true    Ran Out of Food in the Last Year: Sometimes true   Transportation Needs: Unmet Transportation Needs (07/06/2023)   PRAPARE - Administrator, Civil Service (Medical): Yes    Lack of Transportation (Non-Medical): Yes  Physical Activity: Not on file  Stress:  Stress Concern Present (07/06/2023)   [employer] of Occupational Health - Occupational Stress Questionnaire    Feeling of Stress : Rather much  Social Connections: Moderately Integrated (07/06/2023)   Social Connection and Isolation Panel [NHANES]    Frequency of Communication with Friends and Family: Three times a week    Frequency of Social Gatherings with Friends and Family: Once a week    Attends Religious Services: More than 4 times per year    Active Member of [employer] or Organizations: No    Attends Banker Meetings: Never    Marital Status: Living with partner  Intimate Partner Violence: Not At Risk (04/25/2023)   Humiliation, Afraid, Rape, and Kick questionnaire    Fear of Current or Ex-Partner: No    Emotionally Abused: No    Physically Abused: No    Sexually Abused: No      PHYSICAL EXAM  Vitals:   07/12/23 0942  BP: (!) 141/94  Pulse: 67  Weight: 205 lb (93 kg)  Height: 5\' 8"  (1.727 m)   Body mass index is 31.17 kg/m.  Generalized: Well developed, in no acute distress   Neurological examination  Mentation: Alert oriented to time, place, history taking. Follows all commands speech is slightly dysarthric. Cranial nerve II-XII: Pupils were equal round reactive to light. Extraocular movements were full, visual field were full on confrontational test. Facial sensation and strength were normal. Uvula tongue midline. Head turning and shoulder shrug  were normal and symmetric. Motor: The motor testing reveals 5 over 5 strength of all 4 extremities. Good symmetric motor tone is noted throughout.  Sensory: Sensory testing is intact to soft touch on all 4 extremities. No evidence of extinction is noted.  Coordination: Cerebellar testing reveals  good finger-nose-finger and heel-to-shin bilaterally.  Gait and station: Gait is normal.  Reflexes: Deep tendon reflexes are symmetric and normal bilaterally.   DIAGNOSTIC DATA (LABS, IMAGING, TESTING) - I reviewed patient records, labs, notes, testing and imaging myself where available.  Lab Results  Component Value Date   WBC 9.2 07/06/2023   HGB 13.9 07/06/2023   HCT 42.2 07/06/2023   MCV 88 07/06/2023   PLT 215 07/06/2023      Component Value Date/Time   NA 139 07/06/2023 1149   K 4.2 07/06/2023 1149   CL 102 07/06/2023 1149   CO2 22 07/06/2023 1149   GLUCOSE 90 07/06/2023 1149   GLUCOSE 99 04/20/2023 0834   BUN 13 07/06/2023 1149   CREATININE 0.73 07/06/2023 1149   CREATININE 0.82 04/06/2016 1441   CALCIUM 9.0 07/06/2023 1149   PROT 6.1 07/06/2023 1149   ALBUMIN 3.6 (L) 07/06/2023 1149   AST 13 07/06/2023 1149   ALT 17 07/06/2023 1149   ALKPHOS 105 07/06/2023 1149   BILITOT 0.6 07/06/2023 1149   GFRNONAA 49 (L) 04/11/2023 1351   GFRNONAA 83 04/06/2016 1441   GFRAA 71 10/11/2019 1012   GFRAA >89 04/06/2016 1441   Lab Results  Component Value Date   CHOL 121 07/06/2023   HDL 52 07/06/2023   LDLCALC 55 07/06/2023   TRIG 68 07/06/2023   CHOLHDL 2.3 07/06/2023   Lab Results  Component Value Date   HGBA1C 6.3 (H) 04/11/2023   Lab Results  Component Value Date   VITAMINB12 276 05/18/2010   Lab Results  Component Value Date   TSH 1.320 06/21/2023      ASSESSMENT AND PLAN 58 y.o. year old female  has a past medical history of Abnormal thyroid  blood test (03/15/2017), Abnormal uterine bleeding, Alopecia, Anemia, Angina pectoris with normal coronary arteriogram (HCC) (2017), ARNOLD-CHIARI MALFORMATION (06/08/2010), Chronic combined systolic and diastolic CHF (congestive heart failure) (HCC), DYSLIPIDEMIA, Essential hypertension, Fibroids, H/O noncompliance with medical treatment, presenting hazards to health, Hypertension, Hypokalemia (07/23/2016), Menorrhagia,  Nonischemic cardiomyopathy (HCC) (1994; 2017), Nonischemic dilated cardiomyopathy (HCC) (06/17/2015), NSTEMI (non-ST elevated myocardial infarction) (HCC) (05/2015), Peripartum cardiomyopathy (1994), Sleep apnea (2015), Stroke (HCC) (2011), Systolic and diastolic CHF, acute on chronic (HCC) (06/14/2015), and Tobacco abuse. here with:  Acute infarcts in the right occipital lobe, posterior aspect of left insula and left cerebellar hemisphere, possible tiny acute infarction in the left parietal lobe,trace petechial hemorrhage in the medial aspect of the right s/p TNK  Continue aspirin 81 mg daily  for secondary stroke prevention.  Discussed secondary stroke prevention measures and importance of close PCP follow up for aggressive stroke risk factor management. I have gone over the pathophysiology of stroke, warning signs and symptoms, risk factors and their management in some detail with instructions to go to the closest emergency room for symptoms of concern. HTN: BP goal <130/90.  Slightly elevated today. HLD: LDL goal <70. Recent LDL 69.  DMII: A1c goal<7.0. Recent A1c 6.3.  Encouraged patient to monitor diet and encouraged exercise.  We discussed her diet in depth in regards to her A1c level. FU with our office 6 to 8 months or sooner if needed       Butch Penny, MSN, NP-C 07/12/2023, 9:33 AM [facility] [address], [address] [place], [place] [address] [phone]

## 2023-07-13 ENCOUNTER — Telehealth: Payer: Self-pay

## 2023-07-13 ENCOUNTER — Telehealth: Payer: Self-pay | Admitting: *Deleted

## 2023-07-13 DIAGNOSIS — I6930 Unspecified sequelae of cerebral infarction: Secondary | ICD-10-CM | POA: Diagnosis not present

## 2023-07-13 DIAGNOSIS — M170 Bilateral primary osteoarthritis of knee: Secondary | ICD-10-CM | POA: Diagnosis not present

## 2023-07-13 DIAGNOSIS — R296 Repeated falls: Secondary | ICD-10-CM | POA: Diagnosis not present

## 2023-07-13 NOTE — Progress Notes (Signed)
Complex Care Management Note Care Guide Note  07/13/2023 Name: Chelsea Branch MRN: [id number] DOB: 01-17-1966   Complex Care Management Outreach Attempts: An unsuccessful telephone outreach was attempted today to offer the patient information about available complex care management services.  Follow Up Plan:  Additional outreach attempts will be made to offer the patient complex care management information and services.   Encounter Outcome:  No Answer  [facility] HealthPopulation Health Care Guide  Direct Dial:[phone] Fax:[phone] Website: [email or web address]

## 2023-07-13 NOTE — Telephone Encounter (Signed)
Just a FYI   Copied from CRM [id number]. Topic: Clinical - Lab/Test Results >> Jul 13, 2023  3:52 PM Fuller Mandril wrote: Reason for CRM: Patient returned call for lab results. Read note as written by provider. Patient understood. Patient also said she needed number to schedule Mammogram. Provided number. Thank You

## 2023-07-13 NOTE — Telephone Encounter (Signed)
Noted   Once back in office tomorrow  I will give a call with the information

## 2023-07-13 NOTE — Progress Notes (Signed)
 I agree with the above plan

## 2023-07-14 ENCOUNTER — Telehealth: Payer: Self-pay | Admitting: *Deleted

## 2023-07-14 NOTE — Telephone Encounter (Signed)
Correction number provided to patient

## 2023-07-14 NOTE — Progress Notes (Signed)
Complex Care Management Note Care Guide Note  07/14/2023 Name: Makaiya Geerdes MRN: [id number] DOB: [birth year]   Complex Care Management Outreach Attempts: A second unsuccessful outreach was attempted today to offer the patient with information about available complex care management services.  Follow Up Plan:  Additional outreach attempts will be made to offer the patient complex care management information and services.   Encounter Outcome: no answer  [facility] HealthPopulation Health Care Guide  Direct [phone] Fax:[phone] Website: [email or web address]

## 2023-07-15 DIAGNOSIS — I6930 Unspecified sequelae of cerebral infarction: Secondary | ICD-10-CM | POA: Diagnosis not present

## 2023-07-19 ENCOUNTER — Telehealth: Payer: Self-pay | Admitting: *Deleted

## 2023-07-19 NOTE — Progress Notes (Signed)
 Complex Care Management Note Care Guide Note  07/19/2023 Name: Chelsea Branch MRN: [id number] DOB: February 09, 1966   Complex Care Management Outreach Attempts: A third unsuccessful outreach was attempted today to offer the patient with information about available complex care management services.  Follow Up Plan:  No further outreach attempts will be made at this time. We have been unable to contact the patient to offer or enroll patient in complex care management services.  Encounter Outcome:  No Answer  [facility] HealthPopulation Health Care Guide  Direct [phone] [phone] Website: [email or web address]

## 2023-07-20 ENCOUNTER — Telehealth: Payer: Self-pay

## 2023-07-20 NOTE — Patient Outreach (Signed)
 First telephone outreach attempt to obtain mRS. No answer. Left message for returned call.  [facility]  Population Health Care Management Assistant  Direct Dial: [phone]  Fax: [phone] Website: [email or web address]

## 2023-07-21 ENCOUNTER — Other Ambulatory Visit: Payer: Self-pay

## 2023-07-21 ENCOUNTER — Other Ambulatory Visit (HOSPITAL_COMMUNITY): Payer: Self-pay

## 2023-07-22 ENCOUNTER — Encounter: Payer: Self-pay | Admitting: Speech Pathology

## 2023-07-22 ENCOUNTER — Ambulatory Visit: Payer: Medicaid Other | Admitting: Speech Pathology

## 2023-07-22 ENCOUNTER — Other Ambulatory Visit: Payer: Self-pay

## 2023-07-22 ENCOUNTER — Telehealth: Payer: Self-pay

## 2023-07-22 NOTE — Patient Instructions (Signed)
 Visit Information  Chelsea Branch was given information about Medicaid Managed Care team care coordination services as a part of their [facility] Community Plan Medicaid benefit. Chelsea Branch verbally consented to engagement with the [facility] Managed Care team.   If you are experiencing a medical emergency, please call 911 or report to your local emergency department or urgent care.   If you have a non-emergency medical problem during routine business hours, please contact your provider's office and ask to speak with a nurse.   For questions related to your [facility], please call: [phone] or visit the homepage here: [email or web address]  If you would like to schedule transportation through your [facility], please call the following number at least 2 days in advance of your appointment: [phone]   Rides for urgent appointments can also be made after hours by calling Member Services.  Call the Behavioral Health Crisis Line at [phone], at any time, 24 hours a day, 7 days a week. If you are in danger or need immediate medical attention call 911.  If you would like help to quit smoking, call 1-800-QUIT-NOW (5348863845) OR Espaol: 1-855-Djelo-Ya (1-324-401-0272) o para ms informacin haga clic aqu or Text READY to 536-644 to register via text  [name] - following are the goals we discussed in your visit today:   Goals Addressed   None      Social Worker will follow up in 30 days.   [name], [name], MHA [facility]  Managed Medicaid Social Worker [phone]   Following is a copy of your plan of care:  There are no care plans that you recently modified to display for this patient.

## 2023-07-22 NOTE — Therapy (Deleted)
 OUTPATIENT SPEECH LANGUAGE PATHOLOGY APHASIA EVALUATION   Patient Name: Chelsea Branch MRN: [id number] DOB:April 12, 1966, 58 y.o., female Today's Date: 07/22/2023  PCP: [name], MD REFERRING PROVIDER: Micki Riley, MD  END OF SESSION:    Past Medical History:  Diagnosis Date   Abnormal thyroid blood test 03/15/2017   Abnormal uterine bleeding    Alopecia    Anemia    Angina pectoris with normal coronary arteriogram (HCC) 2017   Had + Troponin c/w ? 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 iniatially ?2/2 peripartum in 1994 - improved by 2008 then worsening EF in 2011 back down to EF 25-30%. b. echo 01/21/14 showed mod LVH, [name]; 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   Peripartum cardiomyopathy 1994   Sleep apnea 2015   CPAP 12/2013   Stroke (HCC) 2011   Systolic and diastolic CHF, acute on chronic (HCC) 06/14/2015   Tobacco abuse    Past Surgical History:  Procedure Laterality Date   CARDIAC CATHETERIZATION N/A 06/16/2015   Procedure: Left Heart Cath and Coronary Angiography;  Surgeon: Corky Crafts, MD;  Location: [facility] INVASIVE CV LAB;  Service: Cardiovascular;  Laterality: N/A;   CESAREAN SECTION  1992  1994   INTRAUTERINE DEVICE (IUD) INSERTION     about 3 years ago   LOOP RECORDER IMPLANT  ~ 2000   spine injections      [facility],  Dr Lorrine Kin   TIBIAL TUBERCLERPLASTY     TRANSESOPHAGEAL ECHOCARDIOGRAM (CATH LAB) N/A 04/20/2023   Procedure: TRANSESOPHAGEAL  ECHOCARDIOGRAM;  Surgeon: [name], MD;  Location: [facility] INVASIVE CV LAB;  Service: Cardiovascular;  Laterality: N/A;   TUBAL LIGATION  05/24/1992   Patient Active Problem List   Diagnosis Date Noted   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   History of stroke with residual effects 07/06/2023   Frequent falls 07/06/2023   Prediabetes 04/28/2023   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) 04/28/2023   History of cerebellar stroke 04/13/2023   History of stroke 04/11/2023   Liletta IUD (intrauterine device) in place since 12/15/2015 04/06/2023   Chronic pain syndrome 12/28/2021   Primary osteoarthritis of both knees 12/28/2021   Spinal stenosis of lumbar region 07/21/2021   Lumbar radiculitis 07/21/2021   COPD (chronic obstructive pulmonary disease) (HCC) 07/21/2021   Mechanical complication due to intrauterine contraceptive device 07/21/2021   Arthropathy of lumbar facet joint 03/14/2019   Degenerative disc disease at L5-S1 level 10/10/2018   Hyperthyroidism 04/26/2016   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   History of non-ST elevation myocardial infarction (NSTEMI) 06/14/2015   Homelessness 09/16/2014   Vitamin D deficiency 01/29/2014   OSA (obstructive sleep apnea) 01/08/2014   Chronic combined systolic and diastolic CHF (congestive heart failure) (HCC) 08/14/2013   Essential hypertension    Fibroids 08/09/2012   Menorrhagia 05/08/2012   Female pattern hair loss    HLD (hyperlipidemia) 06/08/2010   CEREBRAL ANEURYSM 06/08/2010   History of cardiovascular disorder  06/08/2010    ONSET DATE: 04/13/2023 (referral date)   REFERRING DIAG: I63.9 (ICD-10-CM) - Cerebellar stroke, acute (HCC) I63.9 (ICD-10-CM) - Acute cerebral infarction  THERAPY DIAG:  No diagnosis found.  Rationale for Evaluation and Treatment: Rehabilitation  SUBJECTIVE:   SUBJECTIVE STATEMENT: Pt reports challenges with communication regarding understanding others and  finding desired word in conversation.  Pt accompanied by: self  PERTINENT HISTORY: "Chelsea Branch is a 58 yo female presenting to ED 11/18 with expressive aphasia, R UE weakness, and confusion. MRI Brain with acute infarcts in the acute occipital lobe, posterior aspect of L insula, and L cerebellar hemisphere. TNK administered. PMH includes prior CVA (2011), OSA, tobacco use, chronic combined CHF, HTN, NSTEMI (2017), NIDCM"   PAIN:  Are you having pain? Yes: Pain location: Legs Pain description: ache  FALLS: Has patient fallen in last 6 months?  Yes, Number of falls: 2 , SLP requesting PT referral for c/o falls and LE weakness/pain, balance issues   LIVING ENVIRONMENT: Lives with: lives with their family, reporting housing instability ("I bounce around")  PLOF:  Level of assistance: Independent with ADLs, Independent with IADLs Employment: Full-time employment - has not returned to work  PATIENT GOALS: be able to speak to friends and family   OBJECTIVE:  Note: Objective measures were completed at Evaluation unless otherwise noted.  DIAGNOSTIC FINDINGS:  Acute stay SLP evaluation 04/12/2023 Clinical Impression   Pt reports living with her significant other and working a Sports coach job at a [employer]. She presents with a fluent aphasia and mild receptive deficits. Pt oriented x4. She appears to use carrier phrases to compensate for expressive deficits during attempts at self-correction during spontaneous speech. She was able to accurately recall 3/5 novel objects after a delay. Her speech is characterized by phonemic and semantic paraphasias. Although writing simultaneously with speech does seem to increase intelligibility, she is perseverative during both verbal and written output. Repetition at the word level is intact, although pt has increased difficulty as complexity progresses to the phrase level. She was able to follow all one- and two-step basic commands, but note increased  difficulty when temporal elements were added. During a confrontation naming task, pt benefitted from sentence completion cues. At times, pt has good awareness of deficits but this is not always consistent. Overall, pt may benefit from ongoing SLP f/u to address deficits listed above. Will follow acutely and recommend intensive f/u upon d/c.   MR BRAIN WO CONTRAST 04/12/2023 IMPRESSION: Acute infarcts in the right occipital lobe, along the posterior aspect of the left insula, and in the left cerebellar hemisphere with additional tiny foci of possible acute infarction in the left parietal lobe. Findings are suspicious for cardioembolic etiology. Trace petechial hemorrhage in the medial aspect of the right occipital lobe and posterior left insula. No significant mass effect.  COGNITION: Overall cognitive status: Within functional limits for tasks assessed  AUDITORY COMPREHENSION: Overall auditory comprehension: Impaired: moderately complex and complex YES/NO questions: Impaired: complex Following directions: Appears intact Conversation: Complex and Moderately Complex Interfering components: attention and processing speed Effective technique: extra processing time, pausing, and repetition/stressing words  READING COMPREHENSION: Impaired: word (non-phonetic spelling)   EXPRESSION:  did not assess 2/2 time constraints   VERBAL EXPRESSION: Level of generative/spontaneous verbalization: conversation Automatic speech: counting: intact  Repetition: Appears intact Naming: Confrontation: 26-50% Pragmatics: Appears intact Interfering components: attention Effective technique: semantic cues, sentence completion, and phonemic cues Non-verbal means of communication: N/A  WRITTEN EXPRESSION: did not assess 2/2 time  MOTOR SPEECH: Overall motor speech: impaired Further assessment to be completed next session  ORAL MOTOR EXAMINATION: Overall status: Did not assess 2/2 time constraints    STANDARDIZED ASSESSMENTS: Quick Aphasia Battery Word comprehension 10.00  Sentence comprehension 6.67  Word finding 7.25  Grammatical construction 9.75  Speech motor programming 10.00  Repetition 9.17  Reading 7.92  QAB overall 8.38  Indicating mild impairment.   Boston Naming Test Pt correctly names 32/60 targets. WNL is 55 or above.  For error'd productions, FO4 facilitative in 35% of opportunities   PATIENT REPORTED OUTCOME MEASURES (PROM): The Communicative Participation Item Bank        Does your condition interfere with... Pt Rating   ...talking with people you know 2   ...communicating when you need to say something quickly 2   ...talking with people you do not know 3   ...communicating when you are out in your community 2   ...asking questions in a conversation 1   ....communicating in a small group of people 2   ...having a long conversation 2   ...giving detailed infomrmation 2   ...getting your turn in a fast moving conversation 2   ...trying to persuade a friend or family member to see a different point of view 2  3= Not at all; 2=A little; 1=Quite a bit; 0=Very much                                                                                                                 TREATMENT DATE:  07/05/23: Completed eval and POC.  PATIENT EDUCATION: Education details: Tour manager Person educated: Patient Education method: Medical illustrator Education comprehension: verbalized understanding and needs further education   GOALS: Goals reviewed with patient? Yes  SHORT TERM GOALS: Target date: 07/06/2023  Pt will use verbal compensations for word finding in structured task with rare min A  Baseline: no compensations for word finding  Goal status: INITIAL  2.  Pt will name 15 items in personally relevant category with occasional min A  Baseline: mild aphasia  Goal status: INITIAL  3.  Pt will demonstrate auditory processing strategies during  structured practice Baseline: 80% accuracy Y/N questions, reports challenges understanding friends/family in conversation Goal status: INITIAL  4.  Pt will complete moderately complex structured language task (sentence level) with 90% accuracy to aid in language rehabilitation Baseline: mild aphasia  Goal status: INITIAL   LONG TERM GOALS: Target date: 09/27/2023  Pt will use compensations for word finding in conversation 4/5 opportunities with rare min A Baseline: no compensations Goal status: INITIAL  2.  Pt will utilize processing compensations during 15 minute conversation in 80% of opportunities, evidenced by appropriate responses/comments Baseline: no compensations Goal status: INITIAL  3.   Pt will complete moderately complex structured language task (discourse level) with 90% accuracy to aid in language rehabilitation Baseline: mild aphasia  Goal status: INITIAL  4.  Pt will verbalize x4 HEP activities for language Baseline: to be established  Goal status: INITIAL  ASSESSMENT:  CLINICAL IMPRESSION: Patient is a 58 y.o. F who was seen today for aphasia and dysarthria evaluation secondary to stroke. Presenting with improvements since onset in November, but ongoing mild aphasia. Pt's communication is c/b reduced comprehension with intermittent awareness (asking for repetition) and anomia marked by mazing, pausing, filler words. Pt not currently demonstrating use of any anomia strategies. Pt reports attempting participation in communicative opportunities with friends and family impacted by her aphasia. Also notable challenges reading which pt reports as onset with stroke. Not extensively evaluation this session d/t time. Mild dysarthria c/b reduced volume and articulatory imprecision. She would benefit from skilled ST to address.   OBJECTIVE IMPAIRMENTS: include mild aphasia, expressive and receptive and dysarthria. These impairments are limiting patient from effectively  communicating at home and in community. Factors affecting potential to achieve goals and functional outcome are financial resources. Patient will benefit from skilled SLP services to address above impairments and improve overall function.  REHAB POTENTIAL: Good  PLAN:  SLP FREQUENCY: 1x/week  SLP DURATION: 12 weeks  PLANNED INTERVENTIONS: Language facilitation, Cueing hierachy, Internal/external aids, Functional tasks, Multimodal communication approach, SLP instruction and feedback, Compensatory strategies, and Patient/family education  Check all possible CPT codes: 40981 - SLP treatment    Check all conditions that are expected to impact treatment: Social determinants of health   If treatment provided at initial evaluation, no treatment charged due to lack of authorization.   Graduate clinician Roylene Reason present for this session and participated in administration of therapeutic interventions under my supervision.   [employer], Student-SLP 07/22/2023, 8:01 AM

## 2023-07-22 NOTE — Patient Outreach (Signed)
 Second telephone outreach attempt to obtain mRS. No answer. Left message for returned call.  [facility]  Population Health Care Management Assistant  Direct Dial: [phone]  Fax: [phone] Website: [email or web address]

## 2023-07-22 NOTE — Therapy (Signed)
 [facility] [address] [place], [place], [address] Phone: [phone]   Fax:  [phone]  Patient Details  Chelsea Branch MRN: [id number] Date of Birth: [birth year]  Encounter Date: 07/22/2023  MISSED VISIT NOTE  Pt did not show for their scheduled ST visit today, 07/22/2023 at 11:00.  Missed Visit #: 2  SLP contacted pt at their primary contact phone number per EMR. SLP left voicemail and reviewed clinic no-show policy. Advised that with third no-show, pt would be d/c from therapy service and require new referral for further interventions. Advised of next scheduled visit for 07/28/2023 at 2:45pm.   Maia Breslow, CCC-SLP 07/22/2023, 11:18 AM  [facility] [address] [place], [place], [address] Phone: [phone]   Fax:  [phone]

## 2023-07-22 NOTE — Patient Outreach (Signed)
 Medicaid Managed Care Social Work Note  07/22/2023 Name:  Chelsea Branch MRN:  [id number] DOB:  02-28-1966  Chelsea Branch is an 58 y.o. year old female who is a primary patient of [name], MD.  The [facility] Managed Care Coordination team was consulted for assistance with:   housng  [name] was given information about Medicaid Managed Care Coordination team services today. [name] Patient agreed to services and verbal consent obtained.  Engaged with patient  for by telephone forinitial visit in response to referral for case management and/or care coordination services.   Patient is participating in a Managed Medicaid Plan:  Yes  Assessments/Interventions:  Review of past medical history, allergies, medications, health status, including review of consultants reports, laboratory and other test data, was performed as part of comprehensive evaluation and provision of chronic care management services.  SDOH: (Social Drivers of Health) assessments and interventions performed: SDOH Interventions    Flowsheet Row Telephone from 07/11/2023 in [name] Office Visit from 07/06/2023 in [name] Comm Health [place] - A Dept Of [place]. [name] Telephone from 04/25/2023 in [facility] [place] POPULATION HEALTH DEPARTMENT Office Visit from 03/19/2023 in [name] Health Comm Health [place] - A Dept Of [place]. [name] Office Visit from 07/21/2021 in [name]. [name]  SDOH Interventions       Food Insecurity Interventions Community Resources Provided  [name] stamps but will give food banks information] AMB Referral Patient Declined  [pt states she has already completed paperwork for food stamps, not interested in food banks/pantries or other resources at this time] -- --  Housing Interventions Community Resources Provided  [Needs housing cant get in owes Housing authority monies will mail senior  housing] AMB Referral Patient Declined  [pt reported she was on setion 8 hosuing-but lost it- in the process of reapplying for it now, stays with her mother at present-denies needing assistance] -- --  Nutritional therapist ([facility]), AMB Referral  [Will complete scat application has medical transportation] AMB Referral Patient Declined  [pt voices her car has been broke down for a year nowher son or boyfriend tkaes her around to where she needs to go or she calls insurance company to arrange transport to medical appts] -- --  Utilities Interventions -- Intervention Not Indicated Intervention Not Indicated  [pt voices she is able to pay her utility bills-no issues] -- --  Alcohol Usage Interventions -- Intervention Not Indicated (Score <7) -- Intervention Not Indicated (Score <7) --  Depression Interventions/Treatment  -- -- -- -- Counseling, Referral to Psychiatry  Financial Strain Interventions -- Other (Comment) -- -- --  Stress Interventions -- Other (Comment) -- -- --  Social Connections Interventions -- Intervention Not Indicated -- Intervention Not Indicated --  Health Literacy Interventions -- -- -- Intervention Not Indicated --      BSW completed a telephone outreach with patient, she states due to having two strokes she is unable to work. She has applied for disability. Patient states she does not have any income and is homeless, stating she bounces around. Patient states she owes housing authority 4,000 and cannot be placed on the waitlist until she pays it. Patient states she receives foodstamps and only needs housing assistance. BSW and patient agreed for resources to be mailed. BSW informed patient without income and owing housing authority obtaining housing may be hard. Patient understood.  Advanced Directives Status:  Not addressed in this encounter.  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) 04/28/2023   History of cerebellar stroke 04/13/2023   History of stroke 04/11/2023   Liletta IUD (intrauterine device) in place since 12/15/2015 04/06/2023   Chronic pain syndrome 12/28/2021   Primary osteoarthritis of both knees 12/28/2021   Spinal stenosis of lumbar region 07/21/2021   Lumbar radiculitis 07/21/2021   COPD (chronic obstructive pulmonary disease) (HCC) 07/21/2021   Mechanical complication due to intrauterine contraceptive device 07/21/2021   Arthropathy of lumbar facet joint 03/14/2019   Degenerative disc disease at L5-S1 level 10/10/2018   Hyperthyroidism 04/26/2016   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   History of non-ST elevation myocardial infarction (NSTEMI) 06/14/2015   Homelessness 09/16/2014   Vitamin D deficiency 01/29/2014   OSA (obstructive sleep apnea) 01/08/2014   Chronic combined systolic and diastolic CHF (congestive heart failure) (HCC) 08/14/2013   Essential hypertension    Fibroids 08/09/2012   Menorrhagia 05/08/2012   Female pattern hair loss    HLD (hyperlipidemia) 06/08/2010   CEREBRAL ANEURYSM 06/08/2010   History of cardiovascular disorder 06/08/2010    Conditions to be addressed/monitored per PCP order:   housing  There are no care plans that you recently modified to display for this patient.   Follow up:  Patient agrees to Care Plan and Follow-up.  Plan: The Managed Medicaid care management team will reach out to the patient again over the next 30 days.  Date/time of next scheduled Social Work care management/care coordination outreach:  08/23/23  [name], [name],  [facility]  Managed [facility] Social Worker [phone]

## 2023-07-25 ENCOUNTER — Other Ambulatory Visit: Payer: Self-pay | Admitting: Family Medicine

## 2023-07-25 ENCOUNTER — Other Ambulatory Visit: Payer: Self-pay

## 2023-07-27 ENCOUNTER — Other Ambulatory Visit: Payer: Self-pay

## 2023-07-27 ENCOUNTER — Telehealth: Payer: Self-pay

## 2023-07-27 NOTE — Patient Outreach (Signed)
 BSW returned patients call. Patient stated she has applied for disability and is waiting on a decision. BSW asked if she has received the housing resources sent on 2/28, she states she has not received them yet, BSW encouraged patient to give it until Friday, if she has not received them to contact BSW and let her know if she received them.  Abelino Derrick, MHA [facility]  Managed [facility] Social Worker [phone]

## 2023-07-27 NOTE — Patient Outreach (Signed)
 Telephone outreach to patient to obtain mRS was successfully completed. MRS= 2  [facility]ice [facility] Care Management Assistant  Direct Dial: [phone]  Fax: [phone] Website: [email or web address]

## 2023-07-28 ENCOUNTER — Ambulatory Visit: Payer: Medicaid Other | Admitting: Speech Pathology

## 2023-07-28 NOTE — Therapy (Deleted)
 OUTPATIENT SPEECH LANGUAGE PATHOLOGY APHASIA TREATMENT   Patient Name: Chelsea Branch MRN: [id number] DOB:June 13, 1965, 58 y.o., female Today's Date: 07/28/2023  PCP: [name], MD REFERRING PROVIDER: Micki Riley, MD  END OF SESSION:    Past Medical History:  Diagnosis Date   Abnormal thyroid blood test 03/15/2017   Abnormal uterine bleeding    Alopecia    Anemia    Angina pectoris with normal coronary arteriogram (HCC) 2017   Had + Troponin c/w ? 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 iniatially ?2/2 peripartum in 1994 - improved by 2008 then worsening EF in 2011 back down to EF 25-30%. b. echo 01/21/14 showed mod LVH, [name]; 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   Peripartum cardiomyopathy 1994   Sleep apnea 2015   CPAP 12/2013   Stroke (HCC) 2011   Systolic and diastolic CHF, acute on chronic (HCC) 06/14/2015   Tobacco abuse    Past Surgical History:  Procedure Laterality Date   CARDIAC CATHETERIZATION N/A 06/16/2015   Procedure: Left Heart Cath and Coronary Angiography;  Surgeon: Corky Crafts, MD;  Location: [facility] INVASIVE CV LAB;  Service: Cardiovascular;  Laterality: N/A;   CESAREAN SECTION  1992  1994   INTRAUTERINE DEVICE (IUD) INSERTION     about 3 years ago   LOOP RECORDER IMPLANT  ~ 2000   spine injections      [facility],  Dr Lorrine Kin   TIBIAL TUBERCLERPLASTY     TRANSESOPHAGEAL ECHOCARDIOGRAM (CATH LAB) N/A 04/20/2023   Procedure: TRANSESOPHAGEAL  ECHOCARDIOGRAM;  Surgeon: [name], MD;  Location: [facility] INVASIVE CV LAB;  Service: Cardiovascular;  Laterality: N/A;   TUBAL LIGATION  05/24/1992   Patient Active Problem List   Diagnosis Date Noted   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   History of stroke with residual effects 07/06/2023   Frequent falls 07/06/2023   Prediabetes 04/28/2023   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) 04/28/2023   History of cerebellar stroke 04/13/2023   History of stroke 04/11/2023   Liletta IUD (intrauterine device) in place since 12/15/2015 04/06/2023   Chronic pain syndrome 12/28/2021   Primary osteoarthritis of both knees 12/28/2021   Spinal stenosis of lumbar region 07/21/2021   Lumbar radiculitis 07/21/2021   COPD (chronic obstructive pulmonary disease) (HCC) 07/21/2021   Mechanical complication due to intrauterine contraceptive device 07/21/2021   Arthropathy of lumbar facet joint 03/14/2019   Degenerative disc disease at L5-S1 level 10/10/2018   Hyperthyroidism 04/26/2016   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   History of non-ST elevation myocardial infarction (NSTEMI) 06/14/2015   Homelessness 09/16/2014   Vitamin D deficiency 01/29/2014   OSA (obstructive sleep apnea) 01/08/2014   Chronic combined systolic and diastolic CHF (congestive heart failure) (HCC) 08/14/2013   Essential hypertension    Fibroids 08/09/2012   Menorrhagia 05/08/2012   Female pattern hair loss    HLD (hyperlipidemia) 06/08/2010   CEREBRAL ANEURYSM 06/08/2010   History of cardiovascular disorder  06/08/2010    ONSET DATE: 04/13/2023 (referral date)   REFERRING DIAG: I63.9 (ICD-10-CM) - Cerebellar stroke, acute (HCC) I63.9 (ICD-10-CM) - Acute cerebral infarction  THERAPY DIAG:  No diagnosis found.  Rationale for Evaluation and Treatment: Rehabilitation  SUBJECTIVE:   SUBJECTIVE STATEMENT: Pt reports challenges with communication regarding understanding others and  finding desired word in conversation.  Pt accompanied by: self  PERTINENT HISTORY: "Chelsea Branch is a 58 yo female presenting to ED 11/18 with expressive aphasia, R UE weakness, and confusion. MRI Brain with acute infarcts in the acute occipital lobe, posterior aspect of L insula, and L cerebellar hemisphere. TNK administered. PMH includes prior CVA (2011), OSA, tobacco use, chronic combined CHF, HTN, NSTEMI (2017), NIDCM"   PAIN:  Are you having pain? Yes: Pain location: Legs Pain description: ache  FALLS: Has patient fallen in last 6 months?  Yes, Number of falls: 2 , SLP requesting PT referral for c/o falls and LE weakness/pain, balance issues   LIVING ENVIRONMENT: Lives with: lives with their family, reporting housing instability ("I bounce around")  PLOF:  Level of assistance: Independent with ADLs, Independent with IADLs Employment: Full-time employment - has not returned to work  PATIENT GOALS: be able to speak to friends and family   OBJECTIVE:  Note: Objective measures were completed at Evaluation unless otherwise noted.  DIAGNOSTIC FINDINGS:  Acute stay SLP evaluation 04/12/2023 Clinical Impression   Pt reports living with her significant other and working a Sports coach job at a [employer]. She presents with a fluent aphasia and mild receptive deficits. Pt oriented x4. She appears to use carrier phrases to compensate for expressive deficits during attempts at self-correction during spontaneous speech. She was able to accurately recall 3/5 novel objects after a delay. Her speech is characterized by phonemic and semantic paraphasias. Although writing simultaneously with speech does seem to increase intelligibility, she is perseverative during both verbal and written output. Repetition at the word level is intact, although pt has increased difficulty as complexity progresses to the phrase level. She was able to follow all one- and two-step basic commands, but note increased  difficulty when temporal elements were added. During a confrontation naming task, pt benefitted from sentence completion cues. At times, pt has good awareness of deficits but this is not always consistent. Overall, pt may benefit from ongoing SLP f/u to address deficits listed above. Will follow acutely and recommend intensive f/u upon d/c.   MR BRAIN WO CONTRAST 04/12/2023 IMPRESSION: Acute infarcts in the right occipital lobe, along the posterior aspect of the left insula, and in the left cerebellar hemisphere with additional tiny foci of possible acute infarction in the left parietal lobe. Findings are suspicious for cardioembolic etiology. Trace petechial hemorrhage in the medial aspect of the right occipital lobe and posterior left insula. No significant mass effect.  COGNITION: Overall cognitive status: Within functional limits for tasks assessed  AUDITORY COMPREHENSION: Overall auditory comprehension: Impaired: moderately complex and complex YES/NO questions: Impaired: complex Following directions: Appears intact Conversation: Complex and Moderately Complex Interfering components: attention and processing speed Effective technique: extra processing time, pausing, and repetition/stressing words  READING COMPREHENSION: Impaired: word (non-phonetic spelling)   EXPRESSION:  did not assess 2/2 time constraints   VERBAL EXPRESSION: Level of generative/spontaneous verbalization: conversation Automatic speech: counting: intact  Repetition: Appears intact Naming: Confrontation: 26-50% Pragmatics: Appears intact Interfering components: attention Effective technique: semantic cues, sentence completion, and phonemic cues Non-verbal means of communication: N/A  WRITTEN EXPRESSION: did not assess 2/2 time  MOTOR SPEECH: Overall motor speech: impaired Further assessment to be completed next session  ORAL MOTOR EXAMINATION: Overall status: Did not assess 2/2 time constraints    STANDARDIZED ASSESSMENTS: Quick Aphasia Battery Word comprehension 10.00  Sentence comprehension 6.67  Word finding 7.25  Grammatical construction 9.75  Speech motor programming 10.00  Repetition 9.17  Reading 7.92  QAB overall 8.38  Indicating mild impairment.   [place] Naming Test Pt correctly names 32/60 targets. WNL is 55 or above.  For error'd productions, FO4 facilitative in 35% of opportunities   PATIENT REPORTED OUTCOME MEASURES (PROM): The Communicative Participation Item Bank        Does your condition interfere with... Pt Rating   ...talking with people you know 2   ...communicating when you need to say something quickly 2   ...talking with people you do not know 3   ...communicating when you are out in your community 2   ...asking questions in a conversation 1   ....communicating in a small group of people 2   ...having a long conversation 2   ...giving detailed infomrmation 2   ...getting your turn in a fast moving conversation 2   ...trying to persuade a friend or family member to see a different point of view 2  3= Not at all; 2=A little; 1=Quite a bit; 0=Very much                                                                                                                 TREATMENT DATE:  07/28/23: SLP initiated conversation to support language activity with pt naming items within personally a relevant category.   07/05/23: Completed eval and POC.  PATIENT EDUCATION: Education details: Tour manager Person educated: Patient Education method: Medical illustrator Education comprehension: verbalized understanding and needs further education   GOALS: Goals reviewed with patient? Yes  SHORT TERM GOALS: Target date: 07/06/2023  Pt will use verbal compensations for word finding in structured task with rare min A  Baseline: no compensations for word finding  Goal status: INITIAL  2.  Pt will name 15 items in personally relevant category with  occasional min A  Baseline: mild aphasia  Goal status: INITIAL  3.  Pt will demonstrate auditory processing strategies during structured practice Baseline: 80% accuracy Y/N questions, reports challenges understanding friends/family in conversation Goal status: INITIAL  4.  Pt will complete moderately complex structured language task (sentence level) with 90% accuracy to aid in language rehabilitation Baseline: mild aphasia  Goal status: INITIAL   LONG TERM GOALS: Target date: 09/27/2023  Pt will use compensations for word finding in conversation 4/5 opportunities with rare min A Baseline: no compensations Goal status: INITIAL  2.  Pt will utilize processing compensations during 15 minute conversation in 80% of opportunities, evidenced by appropriate responses/comments Baseline: no compensations Goal status: INITIAL  3.   Pt will complete moderately complex structured language task (discourse level) with 90% accuracy to aid in language rehabilitation Baseline: mild aphasia  Goal status: INITIAL  4.  Pt will verbalize x4 HEP activities for language Baseline: to be established  Goal status: INITIAL  ASSESSMENT:  CLINICAL IMPRESSION: Patient is a 58 y.o. F who was seen today for aphasia and dysarthria treatment secondary to stroke. Presenting with improvements since onset in November, but ongoing mild aphasia. Pt's communication is c/b reduced comprehension with intermittent awareness (asking for repetition) and anomia marked by mazing, pausing, filler words. Pt not currently demonstrating use of any anomia strategies. Pt reports attempting participation in communicative opportunities with friends and family impacted by her aphasia. Also notable challenges reading which pt reports as onset with stroke. Not extensively evaluation this session d/t time. Mild dysarthria c/b reduced volume and articulatory imprecision. She would continue to benefit from skilled ST to address.   OBJECTIVE  IMPAIRMENTS: include mild aphasia, expressive and receptive and dysarthria. These impairments are limiting patient from effectively communicating at home and in community. Factors affecting potential to achieve goals and functional outcome are financial resources. Patient will benefit from skilled SLP services to address above impairments and improve overall function.  REHAB POTENTIAL: Good  PLAN:  SLP FREQUENCY: 1x/week  SLP DURATION: 12 weeks  PLANNED INTERVENTIONS: Language facilitation, Cueing hierachy, Internal/external aids, Functional tasks, Multimodal communication approach, SLP instruction and feedback, Compensatory strategies, and Patient/family education  Check all possible CPT codes: 78295 - SLP treatment    Check all conditions that are expected to impact treatment: Social determinants of health   If treatment provided at initial evaluation, no treatment charged due to lack of authorization.   Graduate clinician Roylene Reason present for this session and participated in administration of therapeutic interventions under my supervision.   [employer], Student-SLP 07/28/2023, 7:46 AM

## 2023-08-11 ENCOUNTER — Encounter: Payer: Self-pay | Admitting: Physical Therapy

## 2023-08-11 ENCOUNTER — Ambulatory Visit: Payer: Medicaid Other | Attending: Neurology | Admitting: Speech Pathology

## 2023-08-11 ENCOUNTER — Ambulatory Visit: Admitting: Physical Therapy

## 2023-08-11 ENCOUNTER — Other Ambulatory Visit: Payer: Self-pay

## 2023-08-11 VITALS — BP 120/90 | HR 72

## 2023-08-11 DIAGNOSIS — R269 Unspecified abnormalities of gait and mobility: Secondary | ICD-10-CM | POA: Diagnosis present

## 2023-08-11 DIAGNOSIS — R296 Repeated falls: Secondary | ICD-10-CM | POA: Insufficient documentation

## 2023-08-11 DIAGNOSIS — R4701 Aphasia: Secondary | ICD-10-CM | POA: Insufficient documentation

## 2023-08-11 DIAGNOSIS — M6281 Muscle weakness (generalized): Secondary | ICD-10-CM | POA: Insufficient documentation

## 2023-08-11 NOTE — Therapy (Unsigned)
 OUTPATIENT SPEECH LANGUAGE PATHOLOGY APHASIA TREATMENT   Patient Name: Chelsea Branch MRN: [id number] DOB:02-Mar-1966, 58 y.o., female Today's Date: 08/11/2023  PCP: [name], MD REFERRING PROVIDER: Micki Riley, MD  END OF SESSION:  End of Session - 08/11/23 1443     Visit Number 2    Number of Visits 13    Date for SLP Re-Evaluation 09/27/23    SLP Start Time 1530    SLP Stop Time  1615    SLP Time Calculation (min) 45 min    Activity Tolerance Patient tolerated treatment well              Past Medical History:  Diagnosis Date   Abnormal thyroid blood test 03/15/2017   Abnormal uterine bleeding    Alopecia    Anemia    Angina pectoris with normal coronary arteriogram (HCC) 2017   Had + Troponin c/w ? 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 iniatially ?2/2 peripartum in 1994 - improved by 2008 then worsening EF in 2011 back down to EF 25-30%. b. echo 01/21/14 showed mod LVH, [name]; 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   Peripartum cardiomyopathy 1994   Sleep apnea 2015   CPAP 12/2013   Stroke (HCC) 2011   Systolic and diastolic CHF, acute on chronic (HCC) 06/14/2015   Tobacco abuse    Past Surgical History:  Procedure Laterality Date   CARDIAC CATHETERIZATION N/A 06/16/2015   Procedure: Left Heart Cath and Coronary Angiography;  Surgeon: Corky Crafts, MD;  Location: [facility] INVASIVE CV LAB;  Service: Cardiovascular;  Laterality: N/A;   CESAREAN SECTION  1992  1994    INTRAUTERINE DEVICE (IUD) INSERTION     about 3 years ago   LOOP RECORDER IMPLANT  ~ 2000   spine injections      [facility],  Dr Lorrine Kin   TIBIAL TUBERCLERPLASTY     TRANSESOPHAGEAL ECHOCARDIOGRAM (CATH LAB) N/A 04/20/2023   Procedure: TRANSESOPHAGEAL ECHOCARDIOGRAM;  Surgeon: [name], MD;  Location: [facility] INVASIVE CV LAB;  Service: Cardiovascular;  Laterality: N/A;   TUBAL LIGATION  05/24/1992   Patient Active Problem List   Diagnosis Date Noted   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   History of stroke with residual effects 07/06/2023   Frequent falls 07/06/2023   Prediabetes 04/28/2023   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) 04/28/2023   History of cerebellar stroke 04/13/2023   History of stroke 04/11/2023   Liletta IUD (intrauterine device) in place since 12/15/2015 04/06/2023   Chronic pain syndrome 12/28/2021   Primary osteoarthritis of both knees 12/28/2021   Spinal stenosis of lumbar region 07/21/2021   Lumbar radiculitis 07/21/2021   COPD (chronic obstructive pulmonary disease) (HCC) 07/21/2021   Mechanical complication due to intrauterine contraceptive device 07/21/2021   Arthropathy of lumbar facet joint 03/14/2019   Degenerative disc disease at L5-S1 level 10/10/2018   Hyperthyroidism 04/26/2016   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   History of  non-ST elevation myocardial infarction (NSTEMI) 06/14/2015   Homelessness 09/16/2014   Vitamin D deficiency 01/29/2014   OSA (obstructive sleep apnea) 01/08/2014   Chronic combined systolic and diastolic CHF (congestive heart failure) (HCC) 08/14/2013   Essential hypertension    Fibroids 08/09/2012   Menorrhagia 05/08/2012   Female pattern hair loss    HLD (hyperlipidemia) 06/08/2010   CEREBRAL ANEURYSM 06/08/2010   History of cardiovascular disorder 06/08/2010    ONSET DATE: 04/13/2023 (referral date)   REFERRING DIAG: I63.9 (ICD-10-CM) - Cerebellar stroke,  acute (HCC) I63.9 (ICD-10-CM) - Acute cerebral infarction  THERAPY DIAG:  Aphasia  Rationale for Evaluation and Treatment: Rehabilitation  SUBJECTIVE:   SUBJECTIVE STATEMENT: Pt reports continued challenges with finding the words she wants to say and consistently being understood when speaking.  Pt accompanied by: self  PERTINENT HISTORY: "Chelsea Branch is a 58 yo female presenting to ED 11/18 with expressive aphasia, R UE weakness, and confusion. MRI Brain with acute infarcts in the acute occipital lobe, posterior aspect of L insula, and L cerebellar hemisphere. TNK administered. PMH includes prior CVA (2011), OSA, tobacco use, chronic combined CHF, HTN, NSTEMI (2017), NIDCM"   PAIN:  Are you having pain? Yes: Pain location: Legs Pain description: ache  FALLS: Has patient fallen in last 6 months?  Yes, Number of falls: 2 , SLP requesting PT referral for c/o falls and LE weakness/pain, balance issues   LIVING ENVIRONMENT: Lives with: lives with their family, reporting housing instability ("I bounce around")  PLOF:  Level of assistance: Independent with ADLs, Independent with IADLs Employment: Full-time employment - has not returned to work  PATIENT GOALS: be able to speak to friends and family   OBJECTIVE:  Note: Objective measures were completed at Evaluation unless otherwise noted.                                                                                                                TREATMENT DATE:  08/11/23: Target expressive language via naming in personally a relevant categories. Pt names ~10 music artists and ~9 preferred meals. Pt demonstrates usual anomia with more abstract targets. Improved fluency noted for concrete categories. In task describing favorite meals to cook, pt able to explain ingredients needed, favorite foods, steps to prepare, etc. SLP provided education for use of anomia strategies as anomia occurred. Pt benefiting from questioning cues for  description. Was facilitative for informing SLP of target but not in word retrieval. Semantic/phonemic/letter cues ineffective. SLP provided pt education regarding dysarthria strategies with pt returning demonstration and verbalizing understanding. SLP initiated conversational language task instructing pt to think about her speech and implement dysarthria strategies. Pt participated with mod-A and usual cues from SLP to maintain clear, strong, intelligible voice.   07/05/23: Completed eval and POC.  PATIENT EDUCATION: Education details: Tour manager Person educated: Patient Education method: Medical illustrator Education comprehension: verbalized understanding and needs further education   GOALS: Goals reviewed with patient? Yes  SHORT TERM GOALS: Target date: 07/06/2023  Pt will use verbal compensations for  word finding in structured task with rare min A  Baseline: no compensations for word finding  Goal status: IN PROGRESS   2.  Pt will name 15 items in personally relevant category with occasional min A  Baseline: mild aphasia  Goal status: IN PROGRESS   3.  Pt will demonstrate auditory processing strategies during structured practice Baseline: 80% accuracy Y/N questions, reports challenges understanding friends/family in conversation Goal status: IN PROGRESS   4.  Pt will complete moderately complex structured language task (sentence level) with 90% accuracy to aid in language rehabilitation Baseline: mild aphasia  Goal status: IN PROGRESS    LONG TERM GOALS: Target date: 09/27/2023  Pt will use compensations for word finding in conversation 4/5 opportunities with rare min A Baseline: no compensations Goal status: IN PROGRESS   2.  Pt will utilize processing compensations during 15 minute conversation in 80% of opportunities, evidenced by appropriate responses/comments Baseline: no compensations Goal status: IN PROGRESS   3.   Pt will complete moderately complex  structured language task (discourse level) with 90% accuracy to aid in language rehabilitation Baseline: mild aphasia  Goal status: IN PROGRESS   4.  Pt will verbalize x4 HEP activities for language Baseline: to be established  Goal status: IN PROGRESS   ASSESSMENT:  CLINICAL IMPRESSION: Pt seen for skilled ST targeting expressive aphasia. Pt demonstrates improved word finding for concrete targets, usual anomia present for more abstract naming task. Initiated training for word finding strategies. Pt will continue to benefit from ST following current POC.   OBJECTIVE IMPAIRMENTS: include mild aphasia, expressive and receptive and dysarthria. These impairments are limiting patient from effectively communicating at home and in community. Factors affecting potential to achieve goals and functional outcome are financial resources. Patient will benefit from skilled SLP services to address above impairments and improve overall function.  REHAB POTENTIAL: Good  PLAN:  SLP FREQUENCY: 1x/week  SLP DURATION: 12 weeks  PLANNED INTERVENTIONS: Language facilitation, Cueing hierachy, Internal/external aids, Functional tasks, Multimodal communication approach, SLP instruction and feedback, Compensatory strategies, and Patient/family education  Check all possible CPT codes: 16109 - SLP treatment    Check all conditions that are expected to impact treatment: Social determinants of health   If treatment provided at initial evaluation, no treatment charged due to lack of authorization.   Graduate clinician Roylene Reason present for this session and participated in administration of therapeutic interventions under my supervision.   [employer], Student-SLP 08/11/2023, 2:43 PM

## 2023-08-11 NOTE — Therapy (Signed)
 OUTPATIENT PHYSICAL THERAPY NEURO EVALUATION   Patient Name: Chelsea Branch MRN: [id number] DOB:1966/04/30, 58 y.o., female Today's Date: 08/11/2023   PCP: [name], MD  REFERRING PROVIDER: Storm Frisk, MD  END OF SESSION:  PT End of Session - 08/11/23 1452     Visit Number 1    Number of Visits 5    Date for PT Re-Evaluation 09/08/23    Authorization Type [place] Medicaid    PT Start Time 1450    PT Stop Time 1528    PT Time Calculation (min) 38 min    Equipment Utilized During Treatment Gait belt    Activity Tolerance Patient tolerated treatment well    Behavior During Therapy --   irritable            Past Medical History:  Diagnosis Date   Abnormal thyroid blood test 03/15/2017   Abnormal uterine bleeding    Alopecia    Anemia    Angina pectoris with normal coronary arteriogram (HCC) 2017   Had + Troponin c/w ? 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 iniatially ?2/2 peripartum in 1994 - improved by 2008 then worsening EF in 2011 back down to EF 25-30%. b. echo 01/21/14 showed mod LVH, [name]; 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   Peripartum cardiomyopathy 1994   Sleep apnea 2015   CPAP 12/2013   Stroke (HCC) 2011   Systolic and diastolic CHF, acute on chronic (HCC) 06/14/2015   Tobacco abuse    Past Surgical History:  Procedure Laterality Date   CARDIAC CATHETERIZATION N/A 06/16/2015   Procedure: Left Heart Cath and Coronary Angiography;  Surgeon: Corky Crafts, MD;  Location: [facility] INVASIVE CV LAB;  Service: Cardiovascular;  Laterality: N/A;   CESAREAN SECTION  1992  1994   INTRAUTERINE DEVICE (IUD) INSERTION     about 3 years ago   LOOP RECORDER IMPLANT  ~ 2000   spine injections      [facility],  Dr Lorrine Kin   TIBIAL TUBERCLERPLASTY     TRANSESOPHAGEAL ECHOCARDIOGRAM (CATH LAB) N/A 04/20/2023   Procedure: TRANSESOPHAGEAL ECHOCARDIOGRAM;  Surgeon: [name], MD;  Location: [facility] INVASIVE CV LAB;  Service: Cardiovascular;  Laterality: N/A;   TUBAL LIGATION  05/24/1992   Patient Active Problem List   Diagnosis Date Noted   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   History of stroke with residual effects 07/06/2023   Frequent falls 07/06/2023   Prediabetes 04/28/2023   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) 04/28/2023   History of cerebellar stroke 04/13/2023   History of stroke 04/11/2023   Liletta IUD (intrauterine device) in place since 12/15/2015 04/06/2023   Chronic pain syndrome 12/28/2021   Primary osteoarthritis of both knees 12/28/2021   Spinal stenosis of lumbar region 07/21/2021   Lumbar radiculitis 07/21/2021   COPD (chronic obstructive pulmonary disease) (HCC) 07/21/2021   Mechanical complication due to intrauterine contraceptive device 07/21/2021   Arthropathy of lumbar facet joint  03/14/2019   Degenerative disc disease at L5-S1 level 10/10/2018   Hyperthyroidism 04/26/2016   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   History of non-ST elevation myocardial infarction (NSTEMI) 06/14/2015   Homelessness 09/16/2014   Vitamin D deficiency 01/29/2014   OSA (obstructive sleep apnea) 01/08/2014   Chronic combined systolic and diastolic CHF (congestive heart failure) (HCC) 08/14/2013   Essential hypertension    Fibroids 08/09/2012   Menorrhagia 05/08/2012   Female pattern hair loss    HLD (hyperlipidemia) 06/08/2010   CEREBRAL ANEURYSM 06/08/2010   History of cardiovascular  disorder 06/08/2010    ONSET DATE: 07/06/2023 (referral date)  REFERRING DIAG:  Z86.73 (ICD-10-CM) - H/O stroke without residual deficits R29.6 (ICD-10-CM) - Frequent falls I63.9 (ICD-10-CM) - Cerebellar stroke, acute (HCC)  THERAPY DIAG:  Muscle weakness (generalized) - Plan: PT plan of care cert/re-cert  Abnormality of gait and mobility - Plan: PT plan of care cert/re-cert  Repeated falls - Plan: PT plan of care cert/re-cert  Rationale for Evaluation and Treatment: Rehabilitation  SUBJECTIVE:                                                                                                                                                                                             SUBJECTIVE STATEMENT: Patient reported to physical therapy evaluation with rollator. Patient reports last CVA was 04/11/2023. Patient reports that since has been home, she has had 3 falls. Patient reports that her doctor's ordered her a "wheelchair" but she points to her "rollator." Patient reports that she wants to work to keep from falling as much. Patient denies dizziness leading up to her falls; she reports "I was just walking and fell on the ground." She reports she needed help from friends to get up.   Pt accompanied by: self  PERTINENT HISTORY: MI, anemia, DM, asthma, sickle cell anemia, stroke, CHF, hypertension, hyperlipidemia   PAIN:  Are you having pain? Yes: NPRS scale: 8/10 Pain location: down my legs Pain description: "lot of pain" Aggravating factors: sitting, walking Relieving factors: tylenol   PRECAUTIONS: Fall  RED FLAGS: None   WEIGHT BEARING RESTRICTIONS: No  FALLS: Has patient fallen in last 6 months? No  LIVING ENVIRONMENT: Lives with: lives with their partner - with boyfriend  Lives in: House/apartment Stairs:  ramped entry  Has following equipment at home: Environmental consultant - 4 wheeled  PLOF: Independent  PATIENT GOALS: "Work on keeping my legs from going out and prevent falls."    OBJECTIVE:  Note: Objective measures were completed at Evaluation unless otherwise noted.  DIAGNOSTIC FINDINGS: 04/12/23 Brain MRI  IMPRESSION: Acute infarcts in the right occipital lobe,  along the posterior aspect of the left insula, and in the left cerebellar hemisphere with additional tiny foci of possible acute infarction in the left parietal lobe. Findings are suspicious for cardioembolic etiology. Trace petechial hemorrhage in the medial aspect of the right occipital lobe and posterior left insula. No significant mass effect.  COGNITION: Overall cognitive status: Impaired does not recall being here previously for physical therapy until prompted    SENSATION: WFL  COORDINATION: WFL   POSTURE: rounded shoulders, forward head, and increased thoracic kyphosis  LOWER EXTREMITY ROM:     WFL   LOWER EXTREMITY MMT:    Not formally assessed in today's session; able to stand without UE use   BED MOBILITY:  Independent   GAIT: Gait pattern: decreased step length- Right, decreased step length- Left, decreased hip/knee flexion- Right, decreased hip/knee flexion- Left, decreased ankle dorsiflexion- Right, decreased ankle dorsiflexion- Left, and trunk flexed Distance walked: clinic distances,  Assistive device utilized: Environmental consultant - 4 wheeled Level of assistance: SBA  FUNCTIONAL TESTS:      [facility] PT Assessment - 08/11/23 0001       Standardized Balance Assessment   Standardized Balance Assessment Five Times Sit to Stand;10 meter walk test;Timed Up and Go Test    Five times sit to stand comments  34.81   seconds without UE (SBA)   10 Meter Walk 0.5   m/s with rollator (SBA)             SUBJECTIVE TESTS:   Activity Specific Balance Scale: 30/1600 = 1.9% Confidence   VITALS:   Vitals:   08/11/23 1510  BP: (!) 120/90  Pulse: 72  SpO2: 97%    TODAY'S TREATMENT:                                                                                                                                 Initial Eval only  PATIENT EDUCATION: Education details: PT exam findings Person educated: Patient Education method: Explanation Education comprehension: verbalized understanding   GOALS:  LONG TERM GOALS:  Target date: 09/08/2023 (STG = LTG due to POC length)  Patient will report demonstrate independence with final HEP in order to maintain current gains and continue to progress after physical therapy discharge.   Baseline: To be provided Goal status: INITIAL  2.  Patient will improve their 5x Sit to Stand score to less than 28 seconds to demonstrate a decreased risk for falls and improved LE strength.   Baseline: 34.81 seconds without UE use  Goal status: INITIAL  3.  Patient will improve gait speed to 0.6 m/s with LRAD to indicate improvement towards the level of community ambulator in order to participate more easily in activities outside of the home.   Baseline: 0.5 m/s with rollator (SBA) Goal status: INITIAL  4.  Patient will improve ABC Score to 20% or greater to indicate a decreased risk for falls and improved self-reported confidence in balance  and sense of steadiness.   Baseline: 1.9% Confidence Goal status: INITIAL   ASSESSMENT:  CLINICAL IMPRESSION: Patient is a 58 y.o. female who was seen today for physical therapy evaluation and treatment for gait and balance post CVA on 04/05/23 as well as general deconditioning and falls since last seen at this clinic. Patient notably functionally declined since last seen here for physical therapy with change from 5xSTS score of 11.59 seconds to 34 seconds in today's session and 10 MWT score from 1.12 m/s without AD when last here to 0.5 m/s with rollator in today's session. Etiology of decline unknown other than CHF diagnosis and possible exacerbation of symptoms. Recommend close monitoring and trial of PT with possible referral back to medical team due to degree of change in functional measures in  only a few months. Patient will benefit from skilled physical therapy services to address deficits and progress towards maximized independence.     CLINICAL DECISION MAKING: Evolving/moderate complexity  EVALUATION COMPLEXITY: Moderate due to HF  PLAN:  PT FREQUENCY: 2x/week  PT DURATION: 2 weeks  NEXT: reach out to medical team for decline as indicated, work on basic balance HEP and strength work, monitor vitals, MMT of LE to assess for change, try to determine cause of falls and work on fall recovery/safety  Carmelia Bake, PT, DPT   08/11/2023, 4:16 PM   For all possible CPT codes, reference the Planned Interventions line above.     Check all conditions that are expected to impact treatment: {Conditions expected to impact treatment:Respiratory disorders and Neurological condition and/or seizures   If treatment provided at initial evaluation, no treatment charged due to lack of authorization.

## 2023-08-15 ENCOUNTER — Ambulatory Visit: Payer: Self-pay | Admitting: *Deleted

## 2023-08-15 ENCOUNTER — Ambulatory Visit: Payer: Self-pay | Admitting: Family Medicine

## 2023-08-15 ENCOUNTER — Other Ambulatory Visit: Payer: Self-pay

## 2023-08-15 NOTE — Telephone Encounter (Signed)
 Noted.

## 2023-08-15 NOTE — Telephone Encounter (Signed)
 Copied from CRM [id number]. Topic: Clinical - Red Word Triage >> Aug 15, 2023  3:44 PM Shon Hale wrote: Red Word that prompted transfer to Nurse Triage: Worsening pain in both knees.  Chief Complaint: Bilateral knee pain and swelling Symptoms: Pain and swelling Frequency: A month, worsening Pertinent Negatives: Patient denies fever Disposition: [] ED /[] Urgent Care (no appt availability in office) / [x] Appointment(In office/virtual)/ []  [place] [facility]/ [] Home Care/ [x] Refused Recommended Disposition /[] [facility]/ []  Follow-up with PCP Additional Notes: Patient called in to report bilateral pain and swelling in her knees. Patient stated the pain and swelling has been present for about a month. Patient stated her symptoms are worsening to the point where it is hard for her to walk. Patient stated the swelling is localized to her kneecaps. Patient rated the pain a 9 at this time. Patient denied fever, redness, chest pain and SOB above baseline. This RN advised patient to see a provider within 24 hours. No availability with PCP or at PCP office. This RN offered to make an appointment at another location and patient declined. This RN advised that I would route this conversation to the office for their discretion on scheduling. Patient is requesting a call back.    Reason for Disposition  [1] SEVERE pain (e.g., excruciating, unable to walk) AND [2] not improved after 2 hours of pain medicine  Answer Assessment - Initial Assessment Questions 1. LOCATION and RADIATION: "Where is the pain located?"      Bilateral knees 2. QUALITY: "What does the pain feel like?"  (e.g., sharp, dull, aching, burning)     States pain is dull 3. SEVERITY: "How bad is the pain?" "What does it keep you from doing?"   (Scale 1-10; or mild, moderate, severe)   -  MILD (1-3): doesn't interfere with normal activities    -  MODERATE (4-7): interferes with normal activities (e.g., work or school) or awakens from  sleep, limping    -  SEVERE (8-10): excruciating pain, unable to do any normal activities, unable to walk     States the pain makes it hard to walk, states she uses a wheelchair, rates pain a 9 at this time 4. ONSET: "When did the pain start?" "Does it come and go, or is it there all the time?"     About a month and worsening 5. RECURRENT: "Have you had this pain before?" If Yes, ask: "When, and what happened then?"     Denies 6. SETTING: "Has there been any recent work, exercise or other activity that involved that part of the body?"      N/A 7. AGGRAVATING FACTORS: "What makes the knee pain worse?" (e.g., walking, climbing stairs, running)     Walking 8. ASSOCIATED SYMPTOMS: "Is there any swelling or redness of the knee?"     Swelling of knee caps, denies redness 9. OTHER SYMPTOMS: "Do you have any other symptoms?" (e.g., chest pain, difficulty breathing, fever, calf pain)     Denies chest pain, states she feels more "tired" than usual, states she has SOB at baseline  Protocols used: Knee Pain-A-AH

## 2023-08-15 NOTE — Telephone Encounter (Signed)
 Patient returned call to make appt. This RN assisted patient with making appt for Wednesday with first available HCP that fits the patient's transportation needs.

## 2023-08-15 NOTE — Telephone Encounter (Signed)
  Chief Complaint: bilateral knee pain and swelling. Requesting call back for eye/ visual referral Symptoms: bilateral knees swelling pain. Can walk with walker. Reports falling "a lot" did not report falling today . Reports still having issues with vision Frequency: on going  Pertinent Negatives: Patient denies na  Disposition: [] ED /[] Urgent Care (no appt availability in office) / [] Appointment(In office/virtual)/ []  [place] [facility]/ [] Home Care/ [] Refused Recommended Disposition /[] [place] [facility]/ [x]  Follow-up with PCP Additional Notes:   Unable to complete triage due to call disconnected with NT contacted CAL line for questions regarding transportation for patient for appt.  NT attempted to contact patient back no answer, LVMTCB . Please advise.       Copied from CRM [id number]. Topic: Clinical - Red Word Triage >> Aug 15, 2023  3:44 PM Shon Hale wrote: Red Word that prompted transfer to Nurse Triage: Worsening pain in both knees. Reason for Disposition  [1] Very swollen joint AND [2] no fever  Answer Assessment - Initial Assessment Questions 1. LOCATION and RADIATION: "Where is the pain located?"      Bilateral knees 2. QUALITY: "What does the pain feel like?"  (e.g., sharp, dull, aching, burning)     pain 3. SEVERITY: "How bad is the pain?" "What does it keep you from doing?"   (Scale 1-10; or mild, moderate, severe)   -  MILD (1-3): doesn't interfere with normal activities    -  MODERATE (4-7): interferes with normal activities (e.g., work or school) or awakens from sleep, limping    -  SEVERE (8-10): excruciating pain, unable to do any normal activities, unable to walk     Worsening pain can walk with walker reports falling a lot  4. ONSET: "When did the pain start?" "Does it come and go, or is it there all the time?"     On going 5. RECURRENT: "Have you had this pain before?" If Yes, ask: "When, and what happened then?"     Yes  6. SETTING: "Has there been any  recent work, exercise or other activity that involved that part of the body?"      Na  7. AGGRAVATING FACTORS: "What makes the knee pain worse?" (e.g., walking, climbing stairs, running)     Na  8. ASSOCIATED SYMPTOMS: "Is there any swelling or redness of the knee?"     Swelling  9. OTHER SYMPTOMS: "Do you have any other symptoms?" (e.g., chest pain, difficulty breathing, fever, calf pain)     Swelling both knees  visual issues waiting on referral to call her for appt 10. PREGNANCY: "Is there any chance you are pregnant?" "When was your last menstrual period?"       na  Protocols used: Knee Pain-A-AH

## 2023-08-16 ENCOUNTER — Ambulatory Visit: Admitting: Physical Therapy

## 2023-08-16 ENCOUNTER — Encounter: Payer: Self-pay | Admitting: Physical Therapy

## 2023-08-16 VITALS — BP 149/90 | HR 75

## 2023-08-16 DIAGNOSIS — R269 Unspecified abnormalities of gait and mobility: Secondary | ICD-10-CM

## 2023-08-16 DIAGNOSIS — R296 Repeated falls: Secondary | ICD-10-CM

## 2023-08-16 DIAGNOSIS — R4701 Aphasia: Secondary | ICD-10-CM | POA: Diagnosis not present

## 2023-08-16 DIAGNOSIS — M6281 Muscle weakness (generalized): Secondary | ICD-10-CM

## 2023-08-16 NOTE — Therapy (Signed)
 OUTPATIENT PHYSICAL THERAPY NEURO TREATMENT   Patient Name: Chelsea Branch MRN: [id number] DOB:1965-06-07, 58 y.o., female Today's Date: 08/16/2023   PCP: [name], MD  REFERRING PROVIDER: Storm Frisk, MD  END OF SESSION:  PT End of Session - 08/16/23 1452     Visit Number 2    Number of Visits 5    Date for PT Re-Evaluation 09/08/23    Authorization Type [place] [employer]    PT Start Time 1452    PT Stop Time 1532    PT Time Calculation (min) 40 min    Equipment Utilized During Treatment Gait belt    Activity Tolerance Patient tolerated treatment well    Behavior During Therapy Agitated             Past Medical History:  Diagnosis Date   Abnormal thyroid blood test 03/15/2017   Abnormal uterine bleeding    Alopecia    Anemia    Angina pectoris with normal coronary arteriogram (HCC) 2017   Had + Troponin c/w ? 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 iniatially ?2/2 peripartum in 1994 - improved by 2008 then worsening EF in 2011 back down to EF 25-30%. b. echo 01/21/14 showed mod LVH, [name]; 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   Peripartum cardiomyopathy 1994   Sleep apnea 2015   CPAP 12/2013   Stroke (HCC) 2011   Systolic and diastolic CHF, acute on chronic (HCC) 06/14/2015   Tobacco abuse    Past Surgical History:  Procedure Laterality Date   CARDIAC CATHETERIZATION N/A 06/16/2015   Procedure: Left Heart Cath and Coronary Angiography;  Surgeon: Corky Crafts,  MD;  Location: [facility] INVASIVE CV LAB;  Service: Cardiovascular;  Laterality: N/A;   CESAREAN SECTION  1992  1994   INTRAUTERINE DEVICE (IUD) INSERTION     about 3 years ago   LOOP RECORDER IMPLANT  ~ 2000   spine injections      [facility],  Dr Lorrine Kin   TIBIAL TUBERCLERPLASTY     TRANSESOPHAGEAL ECHOCARDIOGRAM (CATH LAB) N/A 04/20/2023   Procedure: TRANSESOPHAGEAL ECHOCARDIOGRAM;  Surgeon: [name], MD;  Location: 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   History of stroke with residual effects 07/06/2023   Frequent falls 07/06/2023   Prediabetes 04/28/2023   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) 04/28/2023   History of cerebellar stroke 04/13/2023   History of stroke 04/11/2023   Liletta IUD (intrauterine device) in place since 12/15/2015 04/06/2023   Chronic pain syndrome 12/28/2021   Primary osteoarthritis of both knees 12/28/2021   Spinal stenosis of lumbar region 07/21/2021   Lumbar radiculitis 07/21/2021   COPD (chronic obstructive pulmonary disease) (HCC) 07/21/2021   Mechanical complication due to intrauterine contraceptive device 07/21/2021   Arthropathy of lumbar facet joint 03/14/2019  Degenerative disc disease at L5-S1 level 10/10/2018   Hyperthyroidism 04/26/2016   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   History of non-ST elevation myocardial infarction (NSTEMI) 06/14/2015   Homelessness 09/16/2014   Vitamin D deficiency 01/29/2014   OSA (obstructive sleep apnea) 01/08/2014   Chronic combined systolic and diastolic CHF (congestive heart failure) (HCC) 08/14/2013   Essential hypertension    Fibroids 08/09/2012   Menorrhagia 05/08/2012   Female pattern hair loss    HLD (hyperlipidemia) 06/08/2010   CEREBRAL ANEURYSM 06/08/2010   History of cardiovascular disorder  06/08/2010    ONSET DATE: 07/06/2023 (referral date)  REFERRING DIAG:  Z86.73 (ICD-10-CM) - H/O stroke without residual deficits R29.6 (ICD-10-CM) - Frequent falls I63.9 (ICD-10-CM) - Cerebellar stroke, acute (HCC)  THERAPY DIAG:  Muscle weakness (generalized)  Abnormality of gait and mobility  Repeated falls  Rationale for Evaluation and Treatment: Rehabilitation  SUBJECTIVE:                                                                                                                                                                                             SUBJECTIVE STATEMENT: Patient arrives with session with rollator. Patient reports that she is doing well overall; no major changes.   Pt accompanied by: self  PERTINENT HISTORY: MI, anemia, DM, asthma, sickle cell anemia, stroke, CHF, hypertension, hyperlipidemia   PAIN:  Are you having pain? Yes: NPRS scale: 8/10 Pain location: down my legs Pain description: "lot of pain" Aggravating factors: sitting, walking Relieving factors: tylenol   PRECAUTIONS: Fall  RED FLAGS: None   WEIGHT BEARING RESTRICTIONS: No  FALLS: Has patient fallen in last 6 months? No  LIVING ENVIRONMENT: Lives with: lives with their partner - with boyfriend  Lives in: House/apartment Stairs:  ramped entry  Has following equipment at home: Environmental consultant - 4 wheeled  PLOF: Independent  PATIENT GOALS: "Work on keeping my legs from going out and prevent falls."   OBJECTIVE:  Note: Objective measures were completed at Evaluation unless otherwise noted.  DIAGNOSTIC FINDINGS: 04/12/23 Brain MRI  IMPRESSION: Acute infarcts in the right occipital lobe, along the posterior aspect of the left insula, and in the left cerebellar hemisphere with additional tiny foci of possible acute infarction in the left parietal lobe. Findings are suspicious for cardioembolic etiology. Trace petechial hemorrhage in the medial aspect of the right occipital lobe  and posterior left insula. No significant mass effect.  COGNITION: Overall cognitive status: Impaired does not recall being here previously for physical therapy until prompted    SENSATION: WFL  COORDINATION: WFL   TODAY'S TREATMENT:  Self Care:  Vitals:   08/16/23 1501 08/16/23 1511  BP: (!) 151/91 (!) 149/90  Pulse: 82 75  SpO2: 97%     At rest       Post-SciFit Assessed as noted above, Seated on LUE, elevated but WFL for therapy, educated on cardiovascular response to exercise and what is considered WNL   TherAct:  Seated Nustep on level 3 with reciprocal UE/LE movements for neural priming, assessment of cardiovascular response to exercise, and gentle strengthening  Target spm > 80, patient reports "if I can't go 80, I am not worried about it"  Discussed progression of decline with patient given degree of changes in outcome measures since patient was last here, patient difficulty articulating cause of decline  MMT to LE: 4-/5 grossly for LE  Gait:   Worked on gait training with rollator as patient with poor use of breaks into session   GAIT: Gait pattern: decreased step length- Right, decreased step length- Left, decreased hip/knee flexion- Right, decreased hip/knee flexion- Left, decreased ankle dorsiflexion- Right, decreased ankle dorsiflexion- Left, and trunk flexed Distance walked: clinic distances, > 400 feet total with seated breaks ~ 4x to practice proper use of rollator  Assistive device utilized: Walker - 4 wheeled Level of assistance: SBA progressing from mod verbal and visual cues to SBA  Educated on use of rollator breaks, practiced standing from various surfaces with pushup from surfaces, how to sit on rollator, education not to be used as wheelchair  PATIENT EDUCATION: Education details: PT exam findings + rollator safety  Person  educated: Patient Education method: Explanation Education comprehension: verbalized understanding and needs further education   GOALS:  LONG TERM GOALS:  Target date: 09/13/2023 (STG = LTG due to POC length)  Patient will report demonstrate independence with final HEP in order to maintain current gains and continue to progress after physical therapy discharge.   Baseline: To be provided Goal status: INITIAL  2.  Patient will improve their 5x Sit to Stand score to less than 28 seconds to demonstrate a decreased risk for falls and improved LE strength.   Baseline: 34.81 seconds without UE use  Goal status: INITIAL  3.  Patient will improve gait speed to 0.6 m/s with LRAD to indicate improvement towards the level of community ambulator in order to participate more easily in activities outside of the home.   Baseline: 0.5 m/s with rollator (SBA) Goal status: INITIAL  4.  Patient will improve ABC Score to 20% or greater to indicate a decreased risk for falls and improved self-reported confidence in balance and sense of steadiness.   Baseline: 1.9% Confidence Goal status: INITIAL   ASSESSMENT:  CLINICAL IMPRESSION: Emphasis on skilled physical therapy session for monitoring cardiovascular response to exercises and education on safety with rollator. Patient progressed from mod verbal/visual cues for rollator use in session to SBA for cueing. Recommend review in future sessions as needed. Continue POC as able.  CLINICAL DECISION MAKING: Evolving/moderate complexity  EVALUATION COMPLEXITY: Moderate due to HF  PLAN:  PT FREQUENCY: 2x/week  PT DURATION: 2 weeks  NEXT: reach out to medical team for decline as indicated, work on basic balance HEP and strength work, monitor vitals, fall recovery, work on basic HEP with sit to stands, sink HEP, hip strengthening, stepping reaction and balance work as appropriate    Carmelia Bake, PT, DPT  08/16/2023, 4:16 PM   For all possible CPT  codes, reference the Planned Interventions line above.     Check  all conditions that are expected to impact treatment: {Conditions expected to impact treatment:Respiratory disorders and Neurological condition and/or seizures   If treatment provided at initial evaluation, no treatment charged due to lack of authorization.

## 2023-08-17 ENCOUNTER — Other Ambulatory Visit: Payer: Self-pay

## 2023-08-17 ENCOUNTER — Ambulatory Visit: Payer: Self-pay | Attending: Nurse Practitioner | Admitting: Nurse Practitioner

## 2023-08-17 ENCOUNTER — Encounter: Payer: Self-pay | Admitting: Nurse Practitioner

## 2023-08-17 VITALS — BP 133/84 | HR 80 | Resp 19 | Ht 68.0 in | Wt 205.0 lb

## 2023-08-17 DIAGNOSIS — G8929 Other chronic pain: Secondary | ICD-10-CM

## 2023-08-17 DIAGNOSIS — M25562 Pain in left knee: Secondary | ICD-10-CM | POA: Diagnosis not present

## 2023-08-17 DIAGNOSIS — J4520 Mild intermittent asthma, uncomplicated: Secondary | ICD-10-CM

## 2023-08-17 DIAGNOSIS — H538 Other visual disturbances: Secondary | ICD-10-CM | POA: Diagnosis not present

## 2023-08-17 DIAGNOSIS — F5101 Primary insomnia: Secondary | ICD-10-CM | POA: Diagnosis not present

## 2023-08-17 DIAGNOSIS — L658 Other specified nonscarring hair loss: Secondary | ICD-10-CM

## 2023-08-17 DIAGNOSIS — M25561 Pain in right knee: Secondary | ICD-10-CM | POA: Diagnosis not present

## 2023-08-17 DIAGNOSIS — I5042 Chronic combined systolic (congestive) and diastolic (congestive) heart failure: Secondary | ICD-10-CM

## 2023-08-17 MED ORDER — ALBUTEROL SULFATE HFA 108 (90 BASE) MCG/ACT IN AERS
2.0000 | INHALATION_SPRAY | RESPIRATORY_TRACT | 0 refills | Status: DC | PRN
  Filled 2023-08-17: qty 18, 30d supply, fill #0
  Filled 2023-08-29: qty 18, 17d supply, fill #0

## 2023-08-17 MED ORDER — MELATONIN 10 MG PO TABS
10.0000 mg | ORAL_TABLET | Freq: Every day | ORAL | 1 refills | Status: DC
  Filled 2023-08-17: qty 90, fill #0

## 2023-08-17 MED ORDER — KETOCONAZOLE 2 % EX SHAM
1.0000 | MEDICATED_SHAMPOO | CUTANEOUS | 1 refills | Status: DC
Start: 2023-08-18 — End: 2024-04-18
  Filled 2023-08-17: qty 120, 34d supply, fill #0
  Filled 2023-08-29: qty 120, 30d supply, fill #0

## 2023-08-17 NOTE — Progress Notes (Signed)
 S.o.b Fluid on knees. Diminished left sided vision.

## 2023-08-17 NOTE — Progress Notes (Signed)
 Assessment & Plan:  Chelsea Branch was seen today for joint swelling.  Diagnoses and all orders for this visit:  Chronic pain of both knees -     DG Knee Complete 4 Views Right; Future -     DG Knee Complete 4 Views Left; Future  Blurred vision, bilateral -     Ambulatory referral to Ophthalmology  Chronic combined systolic and diastolic CHF (congestive heart failure) (HCC) -     Brain natriuretic peptide  Female pattern hair loss -     ketoconazole (NIZORAL) 2 % shampoo; Apply 1 Application topically 2 (two) times a week.  Primary insomnia -     Melatonin 10 MG TABS; Take 10 mg by mouth at bedtime.  Mild intermittent reactive airway disease without complication -     albuterol (VENTOLIN HFA) 108 (90 Base) MCG/ACT inhaler; Inhale 2 puffs into the lungs every 4 (four) hours as needed for wheezing or shortness of breath.    Patient has been counseled on age-appropriate routine health concerns for screening and prevention. These are reviewed and up-to-date. Referrals have been placed accordingly. Immunizations are up-to-date or declined.    Subjective:   Chief Complaint  Patient presents with   Joint Swelling    Chelsea Branch 58 y.o. female presents to office today with complaints of bilateral knee pain and swelling which has been ongoing for a few months now.  She is a patient of Dr. Alvis Lemmings   She has a past medical history of Abnormal thyroid blood test (03/15/2017), Abnormal uterine bleeding, Alopecia, Anemia, Angina pectoris with normal coronary arteriogram  (2017), ARNOLD-CHIARI MALFORMATION (06/08/2010), Chronic combined systolic and diastolic CHF, DYSLIPIDEMIA, Essential hypertension, Fibroids, H/O noncompliance with medical treatment, presenting hazards to health, Hypertension, Hypokalemia (07/23/2016), Menorrhagia, Nonischemic cardiomyopathy (1994; 2017), Nonischemic dilated cardiomyopathy (06/17/2015), NSTEMI (05/2015), Peripartum cardiomyopathy (1994), Sleep apnea (2015), Stroke   (2011), Systolic and diastolic CHF, acute on chronic (06/14/2015), and Tobacco abuse.    Was taking tylenol but can not afford anymore so she is not currently taking any medication for knee pain.   Notes blurred vision in both eyes. Requesting referral to ophthalmologist  Knee Pain: Patient presents for follow up on a knee problem involving the  bilateral knees. Onset of the symptoms was several months ago. Inciting event:  frequent falls . Current symptoms include stiffness, swelling, and pain . Pain is aggravated by any weight bearing, rising after sitting, standing, and walking.  Patient has had no prior knee problems. Evaluation to date: none. Treatment to date: OTC analgesics which are somewhat effective.      Review of Systems  Constitutional:  Negative for fever, malaise/fatigue and weight loss.  HENT: Negative.  Negative for nosebleeds.   Eyes:  Positive for blurred vision. Negative for double vision and photophobia.  Respiratory: Negative.  Negative for cough and shortness of breath.   Cardiovascular: Negative.  Negative for chest pain, palpitations and leg swelling.  Gastrointestinal: Negative.  Negative for heartburn, nausea and vomiting.  Musculoskeletal:  Positive for joint pain. Negative for myalgias.  Neurological: Negative.  Negative for dizziness, focal weakness, seizures and headaches.  Psychiatric/Behavioral:  Negative for suicidal ideas. The patient has insomnia.     Past Medical History:  Diagnosis Date   Abnormal thyroid blood test 03/15/2017   Abnormal uterine bleeding    Alopecia    Anemia    Angina pectoris with normal coronary arteriogram (HCC) 2017   Had + Troponin c/w ? 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 iniatially ?2/2 peripartum in 1994 - improved by 2008 then worsening EF in 2011 back down to EF 25-30%. b. echo 01/21/14 showed mod LVH, [name]; 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   Peripartum cardiomyopathy 1994   Sleep apnea 2015   CPAP 12/2013   Stroke (HCC) 2011   Systolic and diastolic CHF, acute on chronic (HCC) 06/14/2015   Tobacco abuse     Past Surgical History:  Procedure Laterality Date   CARDIAC CATHETERIZATION N/A 06/16/2015   Procedure: Left Heart Cath and Coronary Angiography;  Surgeon: Corky Crafts, MD;  Location: [facility] INVASIVE CV LAB;  Service: Cardiovascular;  Laterality: N/A;   CESAREAN SECTION  1992  1994   INTRAUTERINE DEVICE (IUD) INSERTION     about 3 years ago   LOOP RECORDER IMPLANT  ~ 2000   spine injections      [facility],  Dr Lorrine Kin   TIBIAL TUBERCLERPLASTY     TRANSESOPHAGEAL ECHOCARDIOGRAM (CATH LAB) N/A 04/20/2023   Procedure: TRANSESOPHAGEAL ECHOCARDIOGRAM;  Surgeon: [name], MD;  Location: [facility] INVASIVE CV LAB;  Service: Cardiovascular;  Laterality: N/A;   TUBAL LIGATION  05/24/1992    Family History  Problem Relation Age of Onset   Healthy Mother    Stroke Sister    Hypertension Sister    Cancer Maternal Grandmother        uterine   Other Neg Hx    Heart disease Neg Hx     Social History Reviewed with no changes to be made today.   Outpatient Medications Prior to Visit  Medication Sig Dispense Refill   acetaminophen (TYLENOL) 500 MG tablet Take 1,000 mg by mouth as needed for mild pain (pain score 1-3) or moderate pain (pain score 4-6).     aspirin 81 MG chewable tablet Chew 1 tablet (81 mg total) by mouth daily. 30 tablet 1   atorvastatin (LIPITOR) 40 MG tablet Take 1 tablet  (40 mg total) by mouth daily. 90 tablet 1   fluticasone (FLONASE) 50 MCG/ACT nasal spray Place 2 sprays into both nostrils daily. 9.9 mL 2   methimazole (TAPAZOLE) 5 MG tablet TAKE 1 TABLET BY MOUTH TWICE A DAY 180 tablet 1   metoprolol succinate (TOPROL-XL) 50 MG 24 hr tablet Take 1 tablet (50 mg total) by mouth daily. 90 tablet 1   Polyethylene Glycol 4500 POWD 17 g by Does not apply route daily at 6 (six) AM. Dissolve 1 capful of powder in one 8 ounce glass of water. Repeat daily or as needed to soften stool (Patient taking differently: 17 g by Does not apply route daily as needed (constipation).) 500 g 0   potassium chloride SA (KLOR-CON M20) 20 MEQ tablet Take 2 tablets (40 mEq total) by mouth daily. 180 tablet 1   sacubitril-valsartan (ENTRESTO) 24-26 MG Take 1 tablet by mouth 2 (two) times daily. 180 tablet 1   spironolactone (ALDACTONE) 25 MG tablet Take 0.5 tablets (12.5 mg total) by mouth  daily. 90 tablet 1   torsemide (DEMADEX) 20 MG tablet Take 1 tablet (20 mg total) by mouth daily. 90 tablet 1   albuterol (VENTOLIN HFA) 108 (90 Base) MCG/ACT inhaler Inhale 2 puffs into the lungs every 4 (four) hours as needed for wheezing or shortness of breath. 18 g 0   No facility-administered medications prior to visit.    Allergies  Allergen Reactions   Ace Inhibitors Cough       Objective:    BP 133/84 (BP Location: Left Arm, Patient Position: Sitting, Cuff Size: Normal)   Pulse 80   Resp 19   Ht 5\' 8"  (1.727 m)   Wt 205 lb (93 kg)   SpO2 99%   BMI 31.17 kg/m  Wt Readings from Last 3 Encounters:  08/17/23 205 lb (93 kg)  07/12/23 205 lb (93 kg)  07/06/23 206 lb 6.4 oz (93.6 kg)    Physical Exam Vitals and nursing note reviewed.  Constitutional:      Appearance: She is well-developed.  HENT:     Head: Normocephalic and atraumatic.  Cardiovascular:     Rate and Rhythm: Normal rate and regular rhythm.     Heart sounds: Normal heart sounds. No murmur heard.    No friction  rub. No gallop.  Pulmonary:     Effort: Pulmonary effort is normal. No tachypnea or respiratory distress.     Breath sounds: Normal breath sounds. No decreased breath sounds, wheezing, rhonchi or rales.  Chest:     Chest wall: No tenderness.  Abdominal:     General: Bowel sounds are normal.     Palpations: Abdomen is soft.  Musculoskeletal:        General: Normal range of motion.     Cervical back: Normal range of motion.  Skin:    General: Skin is warm and dry.  Neurological:     Mental Status: She is alert and oriented to person, place, and time.     Coordination: Coordination normal.     Gait: Gait abnormal.     Comments: Uses walker  Psychiatric:        Behavior: Behavior normal. Behavior is cooperative.        Thought Content: Thought content normal.        Judgment: Judgment normal.          Patient has been counseled extensively about nutrition and exercise as well as the importance of adherence with medications and regular follow-up. The patient was given clear instructions to go to ER or return to medical center if symptoms don't improve, worsen or new problems develop. The patient verbalized understanding.   Follow-up: Return in about 3 months (around 11/17/2023) for See PCP A1c.   Claiborne Rigg, FNP-BC [facility] [place], [place] [phone]   08/17/2023, 5:21 PM

## 2023-08-17 NOTE — Patient Instructions (Addendum)
 [employer] [employer] [facility] Located in: [facility] Address: [address] [place], [place], [place] [address] Phone: [phone]    [facility] [address]   [phone]

## 2023-08-18 ENCOUNTER — Encounter: Payer: Self-pay | Admitting: Physical Therapy

## 2023-08-18 ENCOUNTER — Ambulatory Visit: Admitting: Physical Therapy

## 2023-08-18 ENCOUNTER — Other Ambulatory Visit: Payer: Self-pay

## 2023-08-18 ENCOUNTER — Ambulatory Visit: Payer: Medicaid Other | Admitting: Speech Pathology

## 2023-08-18 VITALS — BP 130/89 | HR 80

## 2023-08-18 DIAGNOSIS — R269 Unspecified abnormalities of gait and mobility: Secondary | ICD-10-CM

## 2023-08-18 DIAGNOSIS — R296 Repeated falls: Secondary | ICD-10-CM

## 2023-08-18 DIAGNOSIS — R4701 Aphasia: Secondary | ICD-10-CM | POA: Diagnosis not present

## 2023-08-18 DIAGNOSIS — M6281 Muscle weakness (generalized): Secondary | ICD-10-CM

## 2023-08-18 NOTE — Therapy (Signed)
 OUTPATIENT PHYSICAL THERAPY NEURO TREATMENT   Patient Name: Chelsea Branch MRN: [id number] DOB:05-16-1966, 58 y.o., female Today's Date: 08/18/2023   PCP: [name], MD REFERRING PROVIDER: Storm Frisk, MD  END OF SESSION:  PT End of Session - 08/18/23 1447     Visit Number 3    Number of Visits 5    Date for PT Re-Evaluation 09/08/23    Authorization Type [place] Medicaid    PT Start Time 1445    PT Stop Time 1528    PT Time Calculation (min) 43 min    Equipment Utilized During Treatment Gait belt    Activity Tolerance Patient tolerated treatment well    Behavior During Therapy Agitated             Past Medical History:  Diagnosis Date   Abnormal thyroid blood test 03/15/2017   Abnormal uterine bleeding    Alopecia    Anemia    Angina pectoris with normal coronary arteriogram (HCC) 2017   Had + Troponin c/w ? 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 iniatially ?2/2 peripartum in 1994 - improved by 2008 then worsening EF in 2011 back down to EF 25-30%. b. echo 01/21/14 showed mod LVH, [name]; 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   Peripartum cardiomyopathy 1994   Sleep apnea 2015   CPAP 12/2013   Stroke (HCC) 2011   Systolic and diastolic CHF, acute on chronic (HCC) 06/14/2015   Tobacco abuse    Past Surgical History:  Procedure Laterality Date   CARDIAC CATHETERIZATION N/A 06/16/2015   Procedure: Left Heart Cath and Coronary Angiography;  Surgeon: Corky Crafts, MD;   Location: [facility] INVASIVE CV LAB;  Service: Cardiovascular;  Laterality: N/A;   CESAREAN SECTION  1992  1994   INTRAUTERINE DEVICE (IUD) INSERTION     about 3 years ago   LOOP RECORDER IMPLANT  ~ 2000   spine injections      [facility],  Dr Lorrine Kin   TIBIAL TUBERCLERPLASTY     TRANSESOPHAGEAL ECHOCARDIOGRAM (CATH LAB) N/A 04/20/2023   Procedure: TRANSESOPHAGEAL ECHOCARDIOGRAM;  Surgeon: [name], MD;  Location: [facility] INVASIVE CV LAB;  Service: Cardiovascular;  Laterality: N/A;   TUBAL LIGATION  05/24/1992   Patient Active Problem List   Diagnosis Date Noted   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   History of stroke with residual effects 07/06/2023   Frequent falls 07/06/2023   Prediabetes 04/28/2023   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) 04/28/2023   History of cerebellar stroke 04/13/2023   History of stroke 04/11/2023   Liletta IUD (intrauterine device) in place since 12/15/2015 04/06/2023   Chronic pain syndrome 12/28/2021   Primary osteoarthritis of both knees 12/28/2021   Spinal stenosis of lumbar region 07/21/2021   Lumbar radiculitis 07/21/2021   COPD (chronic obstructive pulmonary disease) (HCC) 07/21/2021   Mechanical complication due to intrauterine contraceptive device 07/21/2021   Arthropathy of lumbar facet joint 03/14/2019  Degenerative disc disease at L5-S1 level 10/10/2018   Hyperthyroidism 04/26/2016   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   History of non-ST elevation myocardial infarction (NSTEMI) 06/14/2015   Homelessness 09/16/2014   Vitamin D deficiency 01/29/2014   OSA (obstructive sleep apnea) 01/08/2014   Chronic combined systolic and diastolic CHF (congestive heart failure) (HCC) 08/14/2013   Essential hypertension    Fibroids 08/09/2012   Menorrhagia 05/08/2012   Female pattern hair loss    HLD (hyperlipidemia) 06/08/2010   CEREBRAL ANEURYSM 06/08/2010   History of cardiovascular disorder  06/08/2010    ONSET DATE: 07/06/2023 (referral date)  REFERRING DIAG:  Z86.73 (ICD-10-CM) - H/O stroke without residual deficits R29.6 (ICD-10-CM) - Frequent falls I63.9 (ICD-10-CM) - Cerebellar stroke, acute (HCC)  THERAPY DIAG:  Muscle weakness (generalized)  Abnormality of gait and mobility  Repeated falls  Rationale for Evaluation and Treatment: Rehabilitation  SUBJECTIVE:                                                                                                                                                                                             SUBJECTIVE STATEMENT: Patient reports that she is doing fine today. Arrives with rollator. She denies falls and near falls since last here. Patient denies any other acute changes.  Pt accompanied by: self  PERTINENT HISTORY: MI, anemia, DM, asthma, sickle cell anemia, stroke, CHF, hypertension, hyperlipidemia   PAIN:  Are you having pain? No  PRECAUTIONS: Fall  RED FLAGS: None   WEIGHT BEARING RESTRICTIONS: No  FALLS: Has patient fallen in last 6 months? No  LIVING ENVIRONMENT: Lives with: lives with their partner - with boyfriend  Lives in: House/apartment Stairs:  ramped entry  Has following equipment at home: Environmental consultant - 4 wheeled  PLOF: Independent  PATIENT GOALS: "Work on keeping my legs from going out and prevent falls."   OBJECTIVE:  Note: Objective measures were completed at Evaluation unless otherwise noted.  DIAGNOSTIC FINDINGS: 04/12/23 Brain MRI  IMPRESSION: Acute infarcts in the right occipital lobe, along the posterior aspect of the left insula, and in the left cerebellar hemisphere with additional tiny foci of possible acute infarction in the left parietal lobe. Findings are suspicious for cardioembolic etiology. Trace petechial hemorrhage in the medial aspect of the right occipital lobe and posterior left insula. No significant mass effect.  COGNITION: Overall cognitive status:  Impaired does not recall being here previously for physical therapy until prompted    SENSATION: WFL  COORDINATION: WFL   TODAY'S TREATMENT:  Self Care:  Vitals:   08/18/23 1451  BP: 130/89  Pulse: 80     At rest     Assessed as noted above, Seated on LUE, elevated but WFL for therapy, educated on cardiovascular response to exercise and what is considered WNL   TherEx:  HEP Exercises - Sit to Stand Without Arm Support - 3 sets - 5 reps - Standing March with Counter Support  - 2 sets - 20 reps - Standing Knee Flexion with Counter Support - 2 sets - 10 reps - Standing Hip Abduction with Unilateral Counter Support- 2 sets - 15 reps  TherAct:  Brief review of rollator safety throughout session including proper use of brakes Reviewed hand placement throughout session where to bring hands back 6 Blaze pods on random setting for improved lateral stepping, strengthening, functional reaching, and obstacle clearance.  Performed on 1 minute intervals with 30 rest periods.  Pt requires SBA guarding. Round 1:  use of UE support on // bars with lateral stepping to blaze pods with tapping to 3 on ground and 3 on mirror with clearance of 4" black foam obstacle  setup.  11 hits. Round 2:   use of UE support on // bars with lateral stepping to blaze pods with tapping to 3 on ground and 3 on mirror with clearance of 4" black foam obstacle setup.  9 hits. Round 3:   use of UE support on // bars with lateral stepping to blaze pods with tapping to 3 on ground and 3 on mirror with clearance of 4" black foam obstacle setup.  9 hits. Notable errors/deficits:  Completed without knocking over obstacles  PATIENT EDUCATION: Education details: Initial HEP Person educated: Patient Education method: Explanation Education comprehension: verbalized understanding and needs further  education  HEP:  Access Code: [email or web address] URL: [email or web address] Date: 08/18/2023 Prepared by: Maryruth Eve  Exercises - Sit to Stand Without Arm Support  - 1 x daily - 7 x weekly - 3 sets - 5 reps - Standing March with Counter Support  - 1 x daily - 7 x weekly - 2 sets - 20 reps - Standing Knee Flexion with Counter Support  - 1 x daily - 7 x weekly - 2 sets - 10 reps - Standing Hip Abduction with Unilateral Counter Support  - 1 x daily - 7 x weekly - 2 sets - 15 reps  GOALS:  LONG TERM GOALS:  Target date: 09/15/2023 (STG = LTG due to POC length)  Patient will report demonstrate independence with final HEP in order to maintain current gains and continue to progress after physical therapy discharge.   Baseline: To be provided Goal status: INITIAL  2.  Patient will improve their 5x Sit to Stand score to less than 28 seconds to demonstrate a decreased risk for falls and improved LE strength.   Baseline: 34.81 seconds without UE use  Goal status: INITIAL  3.  Patient will improve gait speed to 0.6 m/s with LRAD to indicate improvement towards the level of community ambulator in order to participate more easily in activities outside of the home.   Baseline: 0.5 m/s with rollator (SBA) Goal status: INITIAL  4.  Patient will improve ABC Score to 20% or greater to indicate a decreased risk for falls and improved self-reported confidence in balance and sense of steadiness.   Baseline: 1.9% Confidence Goal status: INITIAL   ASSESSMENT:  CLINICAL IMPRESSION: Emphasis on skilled physical therapy session on creation of initial HEP for global strengthening  and work on prolonged standing activities with various stepping and reaching tasks. Worked on clearance of obstacles; patient reporting moderate levels of muscle fatigue by end of session but denies pain. Continue POC as able.  CLINICAL DECISION MAKING: Evolving/moderate complexity  EVALUATION COMPLEXITY: Moderate due to  HF  PLAN:  PT FREQUENCY: 2x/week  PT DURATION: 2 weeks  NEXT: reach out to medical team for decline as indicated, work on basic balance HEP and strength work, monitor vitals, fall recovery, work on basic HEP with sit to stands, sink HEP, hip strengthening, stepping reaction and balance work as appropriate    Carmelia Bake, PT, DPT  08/18/2023, 3:39 PM   For all possible CPT codes, reference the Planned Interventions line above.     Check all conditions that are expected to impact treatment: {Conditions expected to impact treatment:Respiratory disorders and Neurological condition and/or seizures   If treatment provided at initial evaluation, no treatment charged due to lack of authorization.

## 2023-08-18 NOTE — Therapy (Unsigned)
 OUTPATIENT SPEECH LANGUAGE PATHOLOGY APHASIA TREATMENT   Patient Name: Chelsea Branch MRN: [id number] DOB:17-Sep-1965, 58 y.o., female Today's Date: 08/18/2023  PCP: [name], MD REFERRING PROVIDER: Micki Riley, MD  END OF SESSION:     Past Medical History:  Diagnosis Date   Abnormal thyroid blood test 03/15/2017   Abnormal uterine bleeding    Alopecia    Anemia    Angina pectoris with normal coronary arteriogram (HCC) 2017   Had + Troponin c/w ? 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 iniatially ?2/2 peripartum in 1994 - improved by 2008 then worsening EF in 2011 back down to EF 25-30%. b. echo 01/21/14 showed mod LVH, [name]; 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   Peripartum cardiomyopathy 1994   Sleep apnea 2015   CPAP 12/2013   Stroke (HCC) 2011   Systolic and diastolic CHF, acute on chronic (HCC) 06/14/2015   Tobacco abuse    Past Surgical History:  Procedure Laterality Date   CARDIAC CATHETERIZATION N/A 06/16/2015   Procedure: Left Heart Cath and Coronary Angiography;  Surgeon: Corky Crafts, MD;  Location: [facility] INVASIVE CV LAB;  Service: Cardiovascular;  Laterality: N/A;   CESAREAN SECTION  1992  1994   INTRAUTERINE DEVICE (IUD) INSERTION     about 3 years ago   LOOP RECORDER IMPLANT  ~ 2000   spine injections      [facility],  Dr Lorrine Kin   TIBIAL TUBERCLERPLASTY     TRANSESOPHAGEAL ECHOCARDIOGRAM (CATH LAB) N/A 04/20/2023   Procedure: TRANSESOPHAGEAL  ECHOCARDIOGRAM;  Surgeon: [name], MD;  Location: [facility] INVASIVE CV LAB;  Service: Cardiovascular;  Laterality: N/A;   TUBAL LIGATION  05/24/1992   Patient Active Problem List   Diagnosis Date Noted   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   History of stroke with residual effects 07/06/2023   Frequent falls 07/06/2023   Prediabetes 04/28/2023   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) 04/28/2023   History of cerebellar stroke 04/13/2023   History of stroke 04/11/2023   Liletta IUD (intrauterine device) in place since 12/15/2015 04/06/2023   Chronic pain syndrome 12/28/2021   Primary osteoarthritis of both knees 12/28/2021   Spinal stenosis of lumbar region 07/21/2021   Lumbar radiculitis 07/21/2021   COPD (chronic obstructive pulmonary disease) (HCC) 07/21/2021   Mechanical complication due to intrauterine contraceptive device 07/21/2021   Arthropathy of lumbar facet joint 03/14/2019   Degenerative disc disease at L5-S1 level 10/10/2018   Hyperthyroidism 04/26/2016   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   History of non-ST elevation myocardial infarction (NSTEMI) 06/14/2015   Homelessness 09/16/2014   Vitamin D deficiency 01/29/2014   OSA (obstructive sleep apnea) 01/08/2014   Chronic combined systolic and diastolic CHF (congestive heart failure) (HCC) 08/14/2013   Essential hypertension    Fibroids 08/09/2012   Menorrhagia 05/08/2012   Female pattern hair loss    HLD (hyperlipidemia) 06/08/2010   CEREBRAL ANEURYSM 06/08/2010   History of cardiovascular  disorder 06/08/2010    ONSET DATE: 04/13/2023 (referral date)   REFERRING DIAG: I63.9 (ICD-10-CM) - Cerebellar stroke, acute (HCC) I63.9 (ICD-10-CM) - Acute cerebral infarction  THERAPY DIAG:  No diagnosis found.  Rationale for Evaluation and Treatment: Rehabilitation  SUBJECTIVE:   SUBJECTIVE STATEMENT: Pt reports successful communication with her friend.  Pt accompanied by:  self  PERTINENT HISTORY: "[name] is a 58 yo female presenting to ED 11/18 with expressive aphasia, R UE weakness, and confusion. MRI Brain with acute infarcts in the acute occipital lobe, posterior aspect of L insula, and L cerebellar hemisphere. TNK administered. PMH includes prior CVA (2011), OSA, tobacco use, chronic combined CHF, HTN, NSTEMI (2017), NIDCM"   PAIN:  Are you having pain? Yes: Pain location: Legs Pain description: ache  FALLS: Has patient fallen in last 6 months?  Yes, Number of falls: 2 , SLP requesting PT referral for c/o falls and LE weakness/pain, balance issues   LIVING ENVIRONMENT: Lives with: lives with their family, reporting housing instability ("I bounce around")  PLOF:  Level of assistance: Independent with ADLs, Independent with IADLs Employment: Full-time employment - has not returned to work  PATIENT GOALS: be able to speak to friends and family   OBJECTIVE:  Note: Objective measures were completed at Evaluation unless otherwise noted.                                                                                                                TREATMENT DATE:  08/18/23: SLP initiated semantic feature analysis (SFA) to continue targeting anomia and provide further education on anomia strategies. Pt participated effectively with intermittent anomia. Pt implementing strategies with occasional model and mod-A. Potential cog deficits suspected d/t inconsistent performance following directions throughout tasks. SLP reintroduced dysarthria strategies to support increased intelligibility and clear speech. SLP led pt through picture description task encouraging carryover of anomia strategies and clear intelligible speech. Pt participated effectively however minimal anomia noted during this task. Pt required usual mod cues to implement dysarthria strategies, and clinician model to aid understanding. Handout provided for carryover.  08/11/23: Target expressive  language via naming in personally a relevant categories. Pt names ~10 music artists and ~9 preferred meals. Pt demonstrates usual anomia with more abstract targets. Improved fluency noted for concrete categories. In task describing favorite meals to cook, pt able to explain ingredients needed, favorite foods, steps to prepare, etc. SLP provided education for use of anomia strategies as anomia occurred. Pt benefiting from questioning cues for description. Was facilitative for informing SLP of target but not in word retrieval. Semantic/phonemic/letter cues ineffective. SLP provided pt education regarding dysarthria strategies with pt returning demonstration and verbalizing understanding. SLP initiated conversational language task instructing pt to think about her speech and implement dysarthria strategies. Pt participated with mod-A and usual cues from SLP to maintain clear, strong, intelligible voice.   07/05/23: Completed eval and POC.  PATIENT EDUCATION: Education details: POC/goals Person educated: Patient Education method: Medical illustrator Education comprehension: verbalized understanding and needs further education  GOALS: Goals reviewed with patient? Yes  SHORT TERM GOALS: Target date: 07/06/2023  Pt will use verbal compensations for word finding in structured task with rare min A  Baseline: no compensations for word finding  Goal status: IN PROGRESS   2.  Pt will name 15 items in personally relevant category with occasional min A  Baseline: mild aphasia  Goal status: IN PROGRESS   3.  Pt will demonstrate auditory processing strategies during structured practice Baseline: 80% accuracy Y/N questions, reports challenges understanding friends/family in conversation Goal status: IN PROGRESS   4.  Pt will complete moderately complex structured language task (sentence level) with 90% accuracy to aid in language rehabilitation Baseline: mild aphasia  Goal status: IN PROGRESS     LONG TERM GOALS: Target date: 09/27/2023  Pt will use compensations for word finding in conversation 4/5 opportunities with rare min A Baseline: no compensations Goal status: IN PROGRESS   2.  Pt will utilize processing compensations during 15 minute conversation in 80% of opportunities, evidenced by appropriate responses/comments Baseline: no compensations Goal status: IN PROGRESS   3.   Pt will complete moderately complex structured language task (discourse level) with 90% accuracy to aid in language rehabilitation Baseline: mild aphasia  Goal status: IN PROGRESS   4.  Pt will verbalize x4 HEP activities for language Baseline: to be established  Goal status: IN PROGRESS   ASSESSMENT:  CLINICAL IMPRESSION: Pt seen for skilled ST targeting expressive aphasia. Pt demonstrates improved word finding for concrete targets, with anomia present for more abstract naming task. Continued training for word finding strategies and pt education of dysarthria strategies. Pt will continue to benefit from ST following current POC.   OBJECTIVE IMPAIRMENTS: include mild aphasia, expressive and receptive and dysarthria. These impairments are limiting patient from effectively communicating at home and in community. Factors affecting potential to achieve goals and functional outcome are financial resources. Patient will benefit from skilled SLP services to address above impairments and improve overall function.  REHAB POTENTIAL: Good  PLAN:  SLP FREQUENCY: 1x/week  SLP DURATION: 12 weeks  PLANNED INTERVENTIONS: Language facilitation, Cueing hierachy, Internal/external aids, Functional tasks, Multimodal communication approach, SLP instruction and feedback, Compensatory strategies, and Patient/family education  Check all possible CPT codes: 19147 - SLP treatment    Check all conditions that are expected to impact treatment: Social determinants of health   If treatment provided at initial  evaluation, no treatment charged due to lack of authorization.   Graduate clinician Roylene Reason present for this session and participated in administration of therapeutic interventions under my supervision.   [employer], Student-SLP 08/18/2023, 7:56 AM

## 2023-08-19 LAB — BRAIN NATRIURETIC PEPTIDE: BNP: 474 pg/mL — ABNORMAL HIGH (ref 0.0–100.0)

## 2023-08-22 ENCOUNTER — Ambulatory Visit: Payer: Self-pay | Admitting: *Deleted

## 2023-08-22 NOTE — Telephone Encounter (Signed)
  Chief Complaint: Patient states she is stopping the methimazole- she os having dizziness, fatigue and wieght gain which is very upsetting to her.  Symptoms: Patient states the medication methimazole (TAPAZOLE) 5 MG tablet is not helping- she does not feel better on it and is going to stop it- dizziness, fatigue, weight gain Frequency: started medication 07/25/23 Pertinent Negatives: Patient denies fever, chest pain, vomiting, diarrhea, bleeding  Disposition: [] ED /[] Urgent Care (no appt availability in office) / [] Appointment(In office/virtual)/ []  [facility] [facility]/ [] Home Care/ [x] Refused Recommended Disposition /[] [facility]/ []  Follow-up with PCP Additional Notes: Advised patient I need to make an appointment- she declines- patient state sshe was just in the office- she is upset over weight gain. She states she is going to stop the medication.  Patient is unable to check BP- does not have machine- she wants to know if her insurance would cover.   Copied from CRM [id number]. Topic: Clinical - Red Word Triage >> Aug 22, 2023  3:19 PM Nyra Capes wrote: Red Word that prompted transfer to Nurse Triage: patient stated new medication methimazole (TAPAZOLE) 5 MG tablet  not working, patient is very tired, dizzy when standing,  gaining more weight. Patient had a stroke 03/2023 Reason for Disposition  Taking a medicine that could cause dizziness (e.g., blood pressure medications, diuretics)  Answer Assessment - Initial Assessment Questions 1. DESCRIPTION: "Describe your dizziness."     Patient gets dizzy when standing 2. LIGHTHEADED: "Do you feel lightheaded?" (e.g., somewhat faint, woozy, weak upon standing)     Feels weak 3. VERTIGO: "Do you feel like either you or the room is spinning or tilting?" (i.e. vertigo)     no 4. SEVERITY: "How bad is it?"  "Do you feel like you are going to faint?" "Can you stand and walk?"   - MILD: Feels slightly dizzy, but walking normally.   - MODERATE:  Feels unsteady when walking, but not falling; interferes with normal activities (e.g., school, work).   - SEVERE: Unable to walk without falling, or requires assistance to walk without falling; feels like passing out now.      Laying down a lot, heart feels tired, mild- patient uses wheel chair 5. ONSET:  "When did the dizziness begin?"     07/25/23- patient started the medication and has not noticed change, weight gain, fatigue  7. HEART RATE: "Can you tell me your heart rate?" "How many beats in 15 seconds?"  (Note: not all patients can do this)       Patient does not have cuff to measure 8. CAUSE: "What do you think is causing the dizziness?"     Hx stroke, medication   10. OTHER SYMPTOMS: "Do you have any other symptoms?" (e.g., fever, chest pain, vomiting, diarrhea, bleeding)       Weight gain  Protocols used: Dizziness - Lightheadedness-A-AH

## 2023-08-23 ENCOUNTER — Telehealth: Payer: Self-pay | Admitting: Internal Medicine

## 2023-08-23 ENCOUNTER — Other Ambulatory Visit: Payer: Self-pay

## 2023-08-23 NOTE — Telephone Encounter (Signed)
 Called patient back about message. Patient stated she has had swelling in bilateral knees, SOB, and pain in the knees for about a week. Patient stated she has gained about 15 lbs in a month. Patient is currently taking Torsemide 20 mg. Will forward to Dr. Graciela Husbands for advisement.

## 2023-08-23 NOTE — Telephone Encounter (Signed)
 Attempted to contact patient, left message to call our office back

## 2023-08-23 NOTE — Patient Instructions (Signed)
 Visit Information  Chelsea Branch was given information about Medicaid Managed Care team care coordination services as a part of their [facility] Community Plan Medicaid benefit. [name] verbally consented to engagement with the [facility] Managed Care team.   If you are experiencing a medical emergency, please call 911 or report to your local emergency department or urgent care.   If you have a non-emergency medical problem during routine business hours, please contact your provider's office and ask to speak with a nurse.   For questions related to your [facility], please call: [phone] or visit the homepage here: [email or web address]  If you would like to schedule transportation through your [facility], please call the following number at least 2 days in advance of your appointment: [phone]   Rides for urgent appointments can also be made after hours by calling Member Services.  Call the Behavioral Health Crisis Line at [phone], at any time, 24 hours a day, 7 days a week. If you are in danger or need immediate medical attention call 911.  If you would like help to quit smoking, call 1-800-QUIT-NOW (445-249-5693) OR Espaol: 1-855-Djelo-Ya (2-536-644-0347) o para ms informacin haga clic aqu or Text READY to 425-956 to register via text  [name] - following are the goals we discussed in your visit today:   Goals Addressed   None      The  Patient                                              has been provided with contact information for the Managed Medicaid care management team and has been advised to call with any health related questions or concerns.   [name], [name], MHA [facility]  Managed Medicaid Social Worker [phone]   Following is a copy of your plan of care:  There are no care plans that you recently modified to display for this  patient.

## 2023-08-23 NOTE — Patient Outreach (Signed)
 Medicaid Managed Care Social Work Note  08/23/2023 Name:  Chelsea Branch MRN:  [id number] DOB:  1966/01/13  Chelsea Branch is an 58 y.o. year old female who is a primary patient of [name], MD.  The Medicaid Managed Care Coordination team was consulted for assistance with:   housing resources  [name] was given information about Medicaid Managed Care Coordination team services today. Chelsea Branch Patient agreed to services and verbal consent obtained.  Engaged with patient  for by telephone forfollow up visit in response to referral for case management and/or care coordination services.   Patient is participating in a Managed Medicaid Plan:  Yes  Assessments/Interventions:  Review of past medical history, allergies, medications, health status, including review of consultants reports, laboratory and other test data, was performed as part of comprehensive evaluation and provision of chronic care management services.  SDOH: (Social Drivers of Health) assessments and interventions performed: SDOH Interventions    Flowsheet Row Telephone from 07/11/2023 in [place] [facility] Office Visit from 07/06/2023 in [facility] [place] - A Dept Of [place]. [facility] Telephone from 04/25/2023 in [place] POPULATION HEALTH DEPARTMENT Office Visit from 03/19/2023 in [facility] [place] - A Dept Of [place]. [facility] Office Visit from 07/21/2021 in [facility] - A Dept Of Eligha Bridegroom. [facility]  SDOH Interventions       Food Insecurity Interventions Community Resources Provided  [facility] stamps but will give food banks information] AMB Referral Patient Declined  [pt states she has already completed paperwork for food stamps, not interested in food banks/pantries or other resources at this time] -- --  Housing Interventions Community Resources Provided  [Needs housing cant get in owes Housing authority monies will  mail senior housing] AMB Referral Patient Declined  [pt reported she was on setion 8 hosuing-but lost it- in the process of reapplying for it now, stays with her mother at present-denies needing assistance] -- --  Nutritional therapist ([facility] [employer]), AMB Referral  [Will complete scat application has medical transportation] AMB Referral Patient Declined  [pt voices her car has been broke down for a year nowher son or boyfriend tkaes her around to where she needs to go or she calls insurance company to arrange transport to medical appts] -- --  Utilities Interventions -- Intervention Not Indicated Intervention Not Indicated  [pt voices she is able to pay her utility bills-no issues] -- --  Alcohol Usage Interventions -- Intervention Not Indicated (Score <7) -- Intervention Not Indicated (Score <7) --  Depression Interventions/Treatment  -- -- -- -- Counseling, Referral to Psychiatry  Financial Strain Interventions -- Other (Comment) -- -- --  Stress Interventions -- Other (Comment) -- -- --  Social Connections Interventions -- Intervention Not Indicated -- Intervention Not Indicated --  Health Literacy Interventions -- -- -- Intervention Not Indicated --      BSW completed a telephone outreach with patient, she did receive the resources BSW sent to her, she states they were not able to help due to her not having any income yet. Patient states she is still waiting for her disability to start. Patient states no other resources are needed at this time. BSW provided patient with contact information for any future needs.  Advanced Directives Status:  Not addressed in this encounter.  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   History of stroke with  residual effects 07/06/2023   Frequent falls 07/06/2023   Prediabetes 04/28/2023   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) 04/28/2023   History of cerebellar stroke 04/13/2023   History of stroke 04/11/2023   Liletta IUD (intrauterine device) in place since 12/15/2015 04/06/2023   Chronic pain syndrome 12/28/2021   Primary osteoarthritis of both knees 12/28/2021   Spinal stenosis of lumbar region 07/21/2021   Lumbar radiculitis 07/21/2021   COPD (chronic obstructive pulmonary disease) (HCC) 07/21/2021   Mechanical complication due to intrauterine contraceptive device 07/21/2021   Arthropathy of lumbar facet joint 03/14/2019   Degenerative disc disease at L5-S1 level 10/10/2018   Hyperthyroidism 04/26/2016   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   History of non-ST elevation myocardial infarction (NSTEMI) 06/14/2015   Homelessness 09/16/2014   Vitamin D deficiency 01/29/2014   OSA (obstructive sleep apnea) 01/08/2014   Chronic combined systolic and diastolic CHF (congestive heart failure) (HCC) 08/14/2013   Essential hypertension    Fibroids 08/09/2012   Menorrhagia 05/08/2012   Female pattern hair loss    HLD (hyperlipidemia) 06/08/2010   CEREBRAL ANEURYSM 06/08/2010   History of cardiovascular disorder 06/08/2010    Conditions to be addressed/monitored per PCP order:   housing  There are no care plans that you recently modified to display for this patient.   Follow up:  Patient requests no follow-up at this time.  Plan: The  Patient has been provided with contact information for the Managed Medicaid care management team and has been advised to call with any health related questions or concerns.    Abelino Derrick, MHA [facility]  Managed [facility] Social Worker [phone]

## 2023-08-23 NOTE — Telephone Encounter (Signed)
 Pt c/o swelling/edema: STAT if pt has developed SOB within 24 hours  If swelling, where is the swelling located? Both knee areas  How much weight have you gained and in what time span? 200 to 215 over 3 wks time span  Have you gained 2 pounds in a day or 5 pounds in a week?   Do you have a log of your daily weights (if so, list)? No  Are you currently taking a fluid pill? yes  Are you currently SOB? Slight sob w/ exertion  Have you traveled recently in a car or plane for an extended period of time? No

## 2023-08-25 ENCOUNTER — Encounter: Payer: Self-pay | Admitting: Physical Therapy

## 2023-08-25 ENCOUNTER — Ambulatory Visit: Admitting: Physical Therapy

## 2023-08-25 ENCOUNTER — Ambulatory Visit: Payer: Medicaid Other | Attending: Critical Care Medicine | Admitting: Speech Pathology

## 2023-08-25 VITALS — BP 122/86 | HR 71

## 2023-08-25 DIAGNOSIS — R269 Unspecified abnormalities of gait and mobility: Secondary | ICD-10-CM | POA: Diagnosis present

## 2023-08-25 DIAGNOSIS — R4701 Aphasia: Secondary | ICD-10-CM | POA: Insufficient documentation

## 2023-08-25 DIAGNOSIS — R296 Repeated falls: Secondary | ICD-10-CM

## 2023-08-25 DIAGNOSIS — M6281 Muscle weakness (generalized): Secondary | ICD-10-CM | POA: Insufficient documentation

## 2023-08-25 NOTE — Therapy (Signed)
 OUTPATIENT PHYSICAL THERAPY NEURO TREATMENT   Patient Name: Chelsea Branch MRN: [id number] DOB:Jun 02, 1965, 58 y.o., female Today's Date: 08/25/2023   PCP: [name], MD REFERRING PROVIDER: Storm Frisk, MD  END OF SESSION:  PT End of Session - 08/25/23 1516     Visit Number 4    Number of Visits 5    Date for PT Re-Evaluation 09/08/23    Authorization Type [place] Medicaid    PT Start Time 1430    PT Stop Time 1450    PT Time Calculation (min) 20 min    Equipment Utilized During Treatment Gait belt    Activity Tolerance Treatment limited secondary to medical complications (Comment)   awaiting cardiac workup   Behavior During Therapy [facility] for tasks assessed/performed             Past Medical History:  Diagnosis Date   Abnormal thyroid blood test 03/15/2017   Abnormal uterine bleeding    Alopecia    Anemia    Angina pectoris with normal coronary arteriogram (HCC) 2017   Had + Troponin c/w ? 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 iniatially ?2/2 peripartum in 1994 - improved by 2008 then worsening EF in 2011 back down to EF 25-30%. b. echo 01/21/14 showed mod LVH, [name]; 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   Peripartum cardiomyopathy 1994   Sleep apnea 2015   CPAP 12/2013   Stroke (HCC) 2011   Systolic and diastolic CHF, acute on chronic (HCC) 06/14/2015   Tobacco abuse    Past Surgical History:  Procedure Laterality Date   CARDIAC CATHETERIZATION N/A 06/16/2015   Procedure:  Left Heart Cath and Coronary Angiography;  Surgeon: Corky Crafts, MD;  Location: [facility] INVASIVE CV LAB;  Service: Cardiovascular;  Laterality: N/A;   CESAREAN SECTION  1992  1994   INTRAUTERINE DEVICE (IUD) INSERTION     about 3 years ago   LOOP RECORDER IMPLANT  ~ 2000   spine injections      [facility],  Dr Lorrine Kin   TIBIAL TUBERCLERPLASTY     TRANSESOPHAGEAL ECHOCARDIOGRAM (CATH LAB) N/A 04/20/2023   Procedure: TRANSESOPHAGEAL ECHOCARDIOGRAM;  Surgeon: [name], MD;  Location: [facility] INVASIVE CV LAB;  Service: Cardiovascular;  Laterality: N/A;   TUBAL LIGATION  05/24/1992   Patient Active Problem List   Diagnosis Date Noted   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   History of stroke with residual effects 07/06/2023   Frequent falls 07/06/2023   Prediabetes 04/28/2023   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) 04/28/2023   History of cerebellar stroke 04/13/2023   History of stroke 04/11/2023   Liletta IUD (intrauterine device) in place since 12/15/2015 04/06/2023   Chronic pain syndrome 12/28/2021   Primary osteoarthritis of both knees 12/28/2021   Spinal stenosis of lumbar region 07/21/2021   Lumbar radiculitis 07/21/2021   COPD (chronic obstructive pulmonary disease) (HCC) 07/21/2021   Mechanical complication due to intrauterine contraceptive device 07/21/2021  Arthropathy of lumbar facet joint 03/14/2019   Degenerative disc disease at L5-S1 level 10/10/2018   Hyperthyroidism 04/26/2016   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   History of non-ST elevation myocardial infarction (NSTEMI) 06/14/2015   Homelessness 09/16/2014   Vitamin D deficiency 01/29/2014   OSA (obstructive sleep apnea) 01/08/2014   Chronic combined systolic and diastolic CHF (congestive heart failure) (HCC) 08/14/2013   Essential hypertension    Fibroids 08/09/2012   Menorrhagia 05/08/2012   Female pattern hair loss    HLD (hyperlipidemia) 06/08/2010    CEREBRAL ANEURYSM 06/08/2010   History of cardiovascular disorder 06/08/2010    ONSET DATE: 07/06/2023 (referral date)  REFERRING DIAG:  Z86.73 (ICD-10-CM) - H/O stroke without residual deficits R29.6 (ICD-10-CM) - Frequent falls I63.9 (ICD-10-CM) - Cerebellar stroke, acute (HCC)  THERAPY DIAG:  Muscle weakness (generalized)  Repeated falls  Abnormality of gait and mobility  Rationale for Evaluation and Treatment: Rehabilitation  SUBJECTIVE:                                                                                                                                                                                             SUBJECTIVE STATEMENT: Patient reports that she is doing fine today but has cardiac workup due to concern about fluid retention. Arrives with rollator. She denies falls and near falls since last here. Patient denies any other acute changes.  Pt accompanied by: self  PERTINENT HISTORY: MI, anemia, DM, asthma, sickle cell anemia, stroke, CHF, hypertension, hyperlipidemia   PAIN:  Are you having pain? No  PRECAUTIONS: Fall  RED FLAGS: None   WEIGHT BEARING RESTRICTIONS: No  FALLS: Has patient fallen in last 6 months? No  LIVING ENVIRONMENT: Lives with: lives with their partner - with boyfriend  Lives in: House/apartment Stairs:  ramped entry  Has following equipment at home: Environmental consultant - 4 wheeled  PLOF: Independent  PATIENT GOALS: "Work on keeping my legs from going out and prevent falls."   OBJECTIVE:  Note: Objective measures were completed at Evaluation unless otherwise noted.  DIAGNOSTIC FINDINGS: 04/12/23 Brain MRI  IMPRESSION: Acute infarcts in the right occipital lobe, along the posterior aspect of the left insula, and in the left cerebellar hemisphere with additional tiny foci of possible acute infarction in the left parietal lobe. Findings are suspicious for cardioembolic etiology. Trace petechial hemorrhage in the medial aspect of  the right occipital lobe and posterior left insula. No significant mass effect.  COGNITION: Overall cognitive status: Impaired does not recall being here previously for physical therapy until prompted    SENSATION: WFL  COORDINATION: WFL   TODAY'S TREATMENT:  TherAct:  Vitals:   08/25/23 1453  BP: 122/86  Pulse: 71  SpO2: 98%   Given medical team recommending cardiac workup, PT recommended holding exercise due to concern for cardiac function; PT assessed vitals which were overall stable and assessed patient's weight per her request which was 203.4 lbs, discussed possibly insurance covering scale but that PT was unsure and would need to check with cardiology  PATIENT EDUCATION: Education details: Initial HEP Person educated: Patient Education method: Explanation Education comprehension: verbalized understanding and needs further education  HEP:  Access Code: [email or web address] URL: [email or web address] Date: 08/18/2023 Prepared by: Maryruth Eve  Exercises - Sit to Stand Without Arm Support  - 1 x daily - 7 x weekly - 3 sets - 5 reps - Standing March with Counter Support  - 1 x daily - 7 x weekly - 2 sets - 20 reps - Standing Knee Flexion with Counter Support  - 1 x daily - 7 x weekly - 2 sets - 10 reps - Standing Hip Abduction with Unilateral Counter Support  - 1 x daily - 7 x weekly - 2 sets - 15 reps  GOALS:  LONG TERM GOALS:  Target date: 09/22/2023 (STG = LTG due to POC length)  Patient will report demonstrate independence with final HEP in order to maintain current gains and continue to progress after physical therapy discharge.   Baseline: To be provided Goal status: INITIAL  2.  Patient will improve their 5x Sit to Stand score to less than 28 seconds to demonstrate a decreased risk for falls and improved LE strength.   Baseline:  34.81 seconds without UE use  Goal status: INITIAL  3.  Patient will improve gait speed to 0.6 m/s with LRAD to indicate improvement towards the level of community ambulator in order to participate more easily in activities outside of the home.   Baseline: 0.5 m/s with rollator (SBA) Goal status: INITIAL  4.  Patient will improve ABC Score to 20% or greater to indicate a decreased risk for falls and improved self-reported confidence in balance and sense of steadiness.   Baseline: 1.9% Confidence Goal status: INITIAL   ASSESSMENT:  CLINICAL IMPRESSION: Emphasis on skilled physical therapy session on vital and weight assessment per patient request; discussed that patient may be able to reach out to medical team regarding scale through insurance at home (PT unsure if will cover). Held exercise today due to ongoing cardiac workup and will continue POC as able.   CLINICAL DECISION MAKING: Evolving/moderate complexity  EVALUATION COMPLEXITY: Moderate due to HF  PLAN:  PT FREQUENCY: 2x/week  PT DURATION: 2 weeks  NEXT: reach out to medical team for decline as indicated, work on basic balance HEP and strength work, monitor vitals, fall recovery, work on basic HEP with sit to stands, sink HEP, hip strengthening, stepping reaction and balance work as appropriate, how did cardiology workup go? Do we re-cert for a few exercises or D/C    Carmelia Bake, PT, DPT  08/25/2023, 3:29 PM   For all possible CPT codes, reference the Planned Interventions line above.     Check all conditions that are expected to impact treatment: {Conditions expected to impact treatment:Respiratory disorders and Neurological condition and/or seizures   If treatment provided at initial evaluation, no treatment charged due to lack of authorization.

## 2023-08-25 NOTE — Therapy (Unsigned)
 OUTPATIENT SPEECH LANGUAGE PATHOLOGY APHASIA TREATMENT   Patient Name: Chelsea Branch MRN: [id number] DOB:10-14-1965, 58 y.o., female Today's Date: 08/25/2023  PCP: [name], MD REFERRING PROVIDER: Micki Riley, MD  END OF SESSION:  End of Session - 08/25/23 1532     Visit Number 4    Number of Visits 13    Date for SLP Re-Evaluation 09/27/23    SLP Start Time 1532    SLP Stop Time  1615    SLP Time Calculation (min) 43 min    Activity Tolerance Patient tolerated treatment well               Past Medical History:  Diagnosis Date   Abnormal thyroid blood test 03/15/2017   Abnormal uterine bleeding    Alopecia    Anemia    Angina pectoris with normal coronary arteriogram (HCC) 2017   Had + Troponin c/w ? 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 iniatially ?2/2 peripartum in 1994 - improved by 2008 then worsening EF in 2011 back down to EF 25-30%. b. echo 01/21/14 showed mod LVH, [name]; 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   Peripartum cardiomyopathy 1994   Sleep apnea 2015   CPAP 12/2013   Stroke (HCC) 2011   Systolic and diastolic CHF, acute on chronic (HCC) 06/14/2015   Tobacco abuse    Past Surgical History:  Procedure Laterality Date   CARDIAC CATHETERIZATION N/A 06/16/2015   Procedure: Left Heart Cath and Coronary Angiography;  Surgeon: Corky Crafts, MD;  Location: [facility] INVASIVE CV LAB;  Service: Cardiovascular;  Laterality: N/A;   CESAREAN SECTION  1992  1994    INTRAUTERINE DEVICE (IUD) INSERTION     about 3 years ago   LOOP RECORDER IMPLANT  ~ 2000   spine injections      [facility],  Dr Lorrine Kin   TIBIAL TUBERCLERPLASTY     TRANSESOPHAGEAL ECHOCARDIOGRAM (CATH LAB) N/A 04/20/2023   Procedure: TRANSESOPHAGEAL ECHOCARDIOGRAM;  Surgeon: [name], MD;  Location: [facility] INVASIVE CV LAB;  Service: Cardiovascular;  Laterality: N/A;   TUBAL LIGATION  05/24/1992   Patient Active Problem List   Diagnosis Date Noted   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   History of stroke with residual effects 07/06/2023   Frequent falls 07/06/2023   Prediabetes 04/28/2023   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) 04/28/2023   History of cerebellar stroke 04/13/2023   History of stroke 04/11/2023   Liletta IUD (intrauterine device) in place since 12/15/2015 04/06/2023   Chronic pain syndrome 12/28/2021   Primary osteoarthritis of both knees 12/28/2021   Spinal stenosis of lumbar region 07/21/2021   Lumbar radiculitis 07/21/2021   COPD (chronic obstructive pulmonary disease) (HCC) 07/21/2021   Mechanical complication due to intrauterine contraceptive device 07/21/2021   Arthropathy of lumbar facet joint 03/14/2019   Degenerative disc disease at L5-S1 level 10/10/2018   Hyperthyroidism 04/26/2016   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   History  of non-ST elevation myocardial infarction (NSTEMI) 06/14/2015   Homelessness 09/16/2014   Vitamin D deficiency 01/29/2014   OSA (obstructive sleep apnea) 01/08/2014   Chronic combined systolic and diastolic CHF (congestive heart failure) (HCC) 08/14/2013   Essential hypertension    Fibroids 08/09/2012   Menorrhagia 05/08/2012   Female pattern hair loss    HLD (hyperlipidemia) 06/08/2010   CEREBRAL ANEURYSM 06/08/2010   History of cardiovascular disorder 06/08/2010    ONSET DATE: 04/13/2023 (referral date)   REFERRING DIAG: I63.9 (ICD-10-CM) - Cerebellar stroke,  acute (HCC) I63.9 (ICD-10-CM) - Acute cerebral infarction  THERAPY DIAG:  Aphasia  Rationale for Evaluation and Treatment: Rehabilitation  SUBJECTIVE:   SUBJECTIVE STATEMENT: Pt noted some difficulty with writing in light of today's activity.  Pt accompanied by: self  PERTINENT HISTORY: "Chelsea Branch is a 58 yo female presenting to ED 11/18 with expressive aphasia, R UE weakness, and confusion. MRI Brain with acute infarcts in the acute occipital lobe, posterior aspect of L insula, and L cerebellar hemisphere. TNK administered. PMH includes prior CVA (2011), OSA, tobacco use, chronic combined CHF, HTN, NSTEMI (2017), NIDCM"   PAIN:  Are you having pain? Yes: Pain location: Legs Pain description: ache  FALLS: Has patient fallen in last 6 months?  Yes, Number of falls: 2 , SLP requesting PT referral for c/o falls and LE weakness/pain, balance issues   LIVING ENVIRONMENT: Lives with: lives with their family, reporting housing instability ("I bounce around")  PLOF:  Level of assistance: Independent with ADLs, Independent with IADLs Employment: Full-time employment - has not returned to work  PATIENT GOALS: be able to speak to friends and family   OBJECTIVE:  Note: Objective measures were completed at Evaluation unless otherwise noted.                                                                                                                TREATMENT DATE:  08/25/23: SLP initiated VNEST to target anomia and assess patient's written language. SLP began session with review of dysarthria strategies to support improved clarity of speech and increased vocal intensity in today's session. Pt's speech occasionally dropped off with pt benefiting from occasional cues. Pt participated effectively completing 3 trials of Verb Network Strengthening Treatment (VNeST) activity given fading cues and intermittent verbal and visual cues for error 'd spelling and written semantic errors. Pt able to correct  with mod-A in 6/6 opportunities during today's session. SLP provided handout to support continued practice with writing and anomia strategies.   08/18/23: SLP initiated semantic feature analysis (SFA) to continue targeting anomia and provide further education on anomia strategies. Pt participated effectively with intermittent anomia. Pt implementing strategies with occasional model and mod-A. Potential cog deficits suspected d/t inconsistent performance following directions throughout tasks. SLP reintroduced dysarthria strategies to support increased intelligibility and clear speech. SLP led pt through picture description task encouraging carryover of anomia strategies and clear intelligible speech. Pt participated effectively however minimal anomia noted during this task. Pt required usual mod cues to implement dysarthria  strategies, and clinician model to aid understanding. Handout provided for carryover.  08/11/23: Target expressive language via naming in personally a relevant categories. Pt names ~10 music artists and ~9 preferred meals. Pt demonstrates usual anomia with more abstract targets. Improved fluency noted for concrete categories. In task describing favorite meals to cook, pt able to explain ingredients needed, favorite foods, steps to prepare, etc. SLP provided education for use of anomia strategies as anomia occurred. Pt benefiting from questioning cues for description. Was facilitative for informing SLP of target but not in word retrieval. Semantic/phonemic/letter cues ineffective. SLP provided pt education regarding dysarthria strategies with pt returning demonstration and verbalizing understanding. SLP initiated conversational language task instructing pt to think about her speech and implement dysarthria strategies. Pt participated with mod-A and usual cues from SLP to maintain clear, strong, intelligible voice.   07/05/23: Completed eval and POC.  PATIENT EDUCATION: Education details:  Tour manager Person educated: Patient Education method: Medical illustrator Education comprehension: verbalized understanding and needs further education   GOALS: Goals reviewed with patient? Yes  SHORT TERM GOALS: Target date: 07/26/23 (date changed d/t scheduling)   Pt will use verbal compensations for word finding in structured task with rare min A  Baseline: no compensations for word finding  Goal status: NOT MET  2.  Pt will name 15 items in personally relevant category with occasional min A  Baseline: mild aphasia  Goal status: NOT MET  3.  Pt will demonstrate auditory processing strategies during structured practice Baseline: 80% accuracy Y/N questions, reports challenges understanding friends/family in conversation Goal status: NOT MET   4.  Pt will complete moderately complex structured language task (sentence level) with 90% accuracy to aid in language rehabilitation Baseline: mild aphasia  Goal status: NOT MET   LONG TERM GOALS: Target date: 09/27/2023  Pt will use compensations for word finding in conversation 4/5 opportunities with rare min A Baseline: no compensations Goal status: IN PROGRESS   2.  Pt will utilize processing compensations during 15 minute conversation in 80% of opportunities, evidenced by appropriate responses/comments Baseline: no compensations Goal status: IN PROGRESS   3.   Pt will complete moderately complex structured language task (discourse level) with 90% accuracy to aid in language rehabilitation Baseline: mild aphasia  Goal status: IN PROGRESS   4.  Pt will verbalize x4 HEP activities for language Baseline: to be established  Goal status: IN PROGRESS   ASSESSMENT:  CLINICAL IMPRESSION: Pt seen for skilled ST targeting expressive aphasia. Pt demonstrates improved word finding for concrete targets, with anomia present for more abstract naming task. Continued training for word finding strategies and pt education of dysarthria  strategies. Pt will continue to benefit from ST following current POC.   OBJECTIVE IMPAIRMENTS: include mild aphasia, expressive and receptive and dysarthria. These impairments are limiting patient from effectively communicating at home and in community. Factors affecting potential to achieve goals and functional outcome are financial resources. Patient will benefit from skilled SLP services to address above impairments and improve overall function.  REHAB POTENTIAL: Good  PLAN:  SLP FREQUENCY: 1x/week  SLP DURATION: 12 weeks  PLANNED INTERVENTIONS: Language facilitation, Cueing hierachy, Internal/external aids, Functional tasks, Multimodal communication approach, SLP instruction and feedback, Compensatory strategies, and Patient/family education  Check all possible CPT codes: 16109 - SLP treatment    Check all conditions that are expected to impact treatment: Social determinants of health   If treatment provided at initial evaluation, no treatment charged due to lack of authorization.  Graduate clinician Roylene Reason present for this session and participated in administration of therapeutic interventions under my supervision.   [employer], Student-SLP 08/25/2023, 3:33 PM

## 2023-08-25 NOTE — Progress Notes (Deleted)
 {This patient may be at risk for Amyloid. She has one or more dx on the problem list or PMH from the following list - Abnormal EKG, CHF, Aortic Stenosis, Proteinuria, LVH, Carpal Tunnel Syndrome, Biceps Tendon Rupture, Syncope. See list below or review PMH.  Diagnoses From Problem List           Noted     Chronic combined systolic and diastolic CHF (congestive heart failure) (HCC) 08/14/2013    Click HERE to open Cardiac Amyloid Screening SmartSet to order screening OR Click HERE to defer testing for 1 year or permanently :1}    Cardiology Office Note:    Date:  08/25/2023  ID:  Chelsea Branch, DOB 1965-10-13, MRN [id number] PCP: [name], MD  [facility] Providers Cardiologist:  Sherryl Manges, MD Electrophysiologist:  Sherryl Manges, MD { Click to update primary MD,subspecialty MD or APP then REFRESH:1}    {Click to Open Review  :1}   Patient Profile:      (HFrEF) heart failure with reduced ejection fraction Peripartum CM / non-ischemic cardiomyopathy  LHC 9/04: EF 27, no CAD  Echo 3/15: EF 25-30 Echo 8/15: EF 50-55 Echo 1/17: EF 25-30 s/p NSTEMI 05/2015 >> Cath:  Normal coronary arteries (?related to accelerated HTN) Echo 1/18: EF 30-35 Echo 11/18: EF 40-45 Echo 06/2018: EF 60-65 TTE 08/25/21: EF 60-65 TTE 04/12/2023: EF 30-35, [name], mild LVH, GR 3 DD, normal RVSF, severe LAE, moderate MR, AV sclerosis, dilated aorta 38 mm Limited TTE 05/31/2023: EF 34, mild to moderate MR, bubble study negative Hypertensive Heart Dz  Hyperlipidemia S/p recurrent CVA Last 03/2023 - multilobe infarcts >> monitor 04/2023 neg for AFib  Sleep apnea Hyperthyroidism  Tobacco use        Discussed the use of AI scribe software for clinical note transcription with the patient, who gave verbal consent to proceed.  History of Present Illness Chelsea Branch is a 58 y.o. female who returns for evaluation of leg edema and wt gain (15 lbs in 1 month). She was admitted in 03/2023 with an acute  CVA. Multiple territories were involved on MRI. Her EF had been 60-65 in 2023. TTE in the hospital showed LVF had worsened again with EF 30-35. TEE could not be done b/c of apnea. She has followed up with Dr. Graciela Husbands. Monitor was done and there was no AFib. Follow up limited TTE w bubble study was neg for interatrial shunt. EF was 34. She was last seen by Dr. Graciela Husbands 06/21/23. She had just started on methimazole for hyperthyroidism. Plan is to wait for normalization of T4 and then repeat TTE. If EF does not recover, she will need ICD. ILR was not implanted due to potential need for ICD. TTE is scheduled in July 2025.    ROS-See HPI***    Studies Reviewed:       *** Results Labs-chart review 07/28/2023: BNP 474 07/06/2023: K 4.2, creatinine 0.73, ALT 17, total cholesterol 121, HDL 52, LDL 55, triglycerides 68, Hgb 13.9 06/21/2023: TSH 1.32   Risk Assessment/Calculations:   {Does this patient have ATRIAL FIBRILLATION?[phone]}         Physical Exam:   VS:  There were no vitals taken for this visit.   Wt Readings from Last 3 Encounters:  08/17/23 205 lb (93 kg)  07/12/23 205 lb (93 kg)  07/06/23 206 lb 6.4 oz (93.6 kg)    Physical Exam***     Assessment and Plan:   Assessment & Plan HFrEF (heart failure with  reduced ejection fraction) (HCC)  Assessment and Plan Assessment & Plan    { 1. Chronic combined systolic and diastolic CHF (congestive heart failure) (HCC) History of nonischemic cardiomyopathy.  EF has been as low as 25-30%.  Recent echocardiogram in February 2020 demonstrated normal LV function.  She continues to have issues with symptomatic hypotension.  She presses shirts at work.  The environment is quite hot and she does sweat quite a bit.  Therefore, I have recommended that we continue to decrease her dose of diuretic.  I will also decrease her dose of Entresto.  Of note, I suspect her heart rate is up today secondary to low blood pressure.             -Decrease torsemide to  20 mg daily             -Decrease potassium to 40 mEq in the morning, 20 mEq in the afternoon             -Decrease Entresto to 49/51 mg twice daily             -BMET, CBC in 1 week             -Follow-up with me in 6 to 8 weeks   2. Hypertensive heart disease with chronic combined systolic and diastolic congestive heart failure (HCC) As noted, her blood pressure is running low.  She is symptomatic with this.  Adjust medications as outlined above.   3. Hyperlipidemia, unspecified hyperlipidemia type Continue statin therapy.     :1}    {Are you ordering a CV Procedure (e.g. stress test, cath, DCCV, TEE, etc)?   Press F2        :[id number]}  Dispo:  No follow-ups on file.  Signed, Tereso Newcomer, PA-C

## 2023-08-25 NOTE — Telephone Encounter (Signed)
 Spoke wit pt who complains of increasing LEE edema over the past 3 weeks.  Appointment scheduled with Tereso Newcomer, PA-C for 08/26/2023 at 8am.  Reviewed ED precautions.  Pt verbalizes understanding and agrees with current plan.

## 2023-08-26 ENCOUNTER — Encounter: Payer: Self-pay | Admitting: Internal Medicine

## 2023-08-26 ENCOUNTER — Encounter (HOSPITAL_COMMUNITY): Payer: Self-pay

## 2023-08-26 ENCOUNTER — Ambulatory Visit: Admitting: Physician Assistant

## 2023-08-26 ENCOUNTER — Other Ambulatory Visit: Payer: Self-pay

## 2023-08-26 ENCOUNTER — Telehealth: Payer: Self-pay | Admitting: Internal Medicine

## 2023-08-26 ENCOUNTER — Telehealth: Payer: Self-pay | Admitting: Pharmacy Technician

## 2023-08-26 ENCOUNTER — Ambulatory Visit (HOSPITAL_COMMUNITY)
Admission: EM | Admit: 2023-08-26 | Discharge: 2023-08-26 | Disposition: A | Discharge status: Home / Self Care | Attending: Physician Assistant | Admitting: Physician Assistant

## 2023-08-26 ENCOUNTER — Ambulatory Visit: Attending: Internal Medicine | Admitting: Internal Medicine

## 2023-08-26 ENCOUNTER — Other Ambulatory Visit (HOSPITAL_COMMUNITY): Payer: Self-pay

## 2023-08-26 VITALS — BP 122/78 | HR 88 | Ht 68.0 in | Wt 198.4 lb

## 2023-08-26 DIAGNOSIS — I420 Dilated cardiomyopathy: Secondary | ICD-10-CM

## 2023-08-26 DIAGNOSIS — I5020 Unspecified systolic (congestive) heart failure: Secondary | ICD-10-CM

## 2023-08-26 DIAGNOSIS — Z3202 Encounter for pregnancy test, result negative: Secondary | ICD-10-CM

## 2023-08-26 DIAGNOSIS — R0602 Shortness of breath: Secondary | ICD-10-CM | POA: Diagnosis not present

## 2023-08-26 DIAGNOSIS — Z8673 Personal history of transient ischemic attack (TIA), and cerebral infarction without residual deficits: Secondary | ICD-10-CM

## 2023-08-26 DIAGNOSIS — I700 Atherosclerosis of aorta: Secondary | ICD-10-CM | POA: Diagnosis not present

## 2023-08-26 DIAGNOSIS — E785 Hyperlipidemia, unspecified: Secondary | ICD-10-CM

## 2023-08-26 DIAGNOSIS — R1030 Lower abdominal pain, unspecified: Secondary | ICD-10-CM | POA: Diagnosis not present

## 2023-08-26 DIAGNOSIS — N898 Other specified noninflammatory disorders of vagina: Secondary | ICD-10-CM | POA: Insufficient documentation

## 2023-08-26 DIAGNOSIS — R300 Dysuria: Secondary | ICD-10-CM | POA: Diagnosis not present

## 2023-08-26 DIAGNOSIS — Z6830 Body mass index (BMI) 30.0-30.9, adult: Secondary | ICD-10-CM | POA: Diagnosis not present

## 2023-08-26 LAB — POCT URINALYSIS DIP (MANUAL ENTRY)
Bilirubin, UA: NEGATIVE
Blood, UA: NEGATIVE
Glucose, UA: NEGATIVE mg/dL
Ketones, POC UA: NEGATIVE mg/dL
Leukocytes, UA: NEGATIVE
Nitrite, UA: NEGATIVE
Protein Ur, POC: 100 mg/dL — AB
Spec Grav, UA: 1.02
Urobilinogen, UA: 1 U/dL
pH, UA: 6

## 2023-08-26 LAB — HIV ANTIBODY (ROUTINE TESTING W REFLEX): HIV Screen 4th Generation wRfx: NONREACTIVE

## 2023-08-26 LAB — POCT URINE PREGNANCY: Preg Test, Ur: NEGATIVE

## 2023-08-26 MED ORDER — EMPAGLIFLOZIN 10 MG PO TABS: 10.0000 mg | ORAL_TABLET | Freq: Every day | ORAL | Status: DC

## 2023-08-26 MED ORDER — ALBUTEROL SULFATE HFA 108 (90 BASE) MCG/ACT IN AERS
2.0000 | INHALATION_SPRAY | Freq: Once | RESPIRATORY_TRACT | Status: AC
  Administered 2023-08-26: 2 via RESPIRATORY_TRACT

## 2023-08-26 MED ORDER — METOPROLOL SUCCINATE ER 50 MG PO TB24
75.0000 mg | ORAL_TABLET | Freq: Every day | ORAL | 1 refills | Status: DC
  Filled 2023-08-26 – 2023-10-18 (×2): qty 135, 90d supply, fill #0
  Filled 2024-01-16: qty 135, 90d supply, fill #1

## 2023-08-26 MED ORDER — ALBUTEROL SULFATE HFA 108 (90 BASE) MCG/ACT IN AERS
INHALATION_SPRAY | RESPIRATORY_TRACT | Status: AC
  Filled 2023-08-26: qty 6.7

## 2023-08-26 MED ORDER — SACUBITRIL-VALSARTAN 49-51 MG PO TABS
1.0000 | ORAL_TABLET | Freq: Two times a day (BID) | ORAL | 3 refills | Status: DC
  Filled 2023-08-26: qty 60, 30d supply, fill #0
  Filled 2023-09-30: qty 60, 30d supply, fill #1
  Filled 2023-11-08: qty 60, 30d supply, fill #2
  Filled 2023-12-21: qty 60, 30d supply, fill #3
  Filled 2024-01-16: qty 60, 30d supply, fill #4
  Filled 2024-03-05 (×2): qty 60, 30d supply, fill #5

## 2023-08-26 MED ORDER — SPIRONOLACTONE 25 MG PO TABS
25.0000 mg | ORAL_TABLET | Freq: Every day | ORAL | 1 refills | Status: DC
  Filled 2023-08-26 – 2023-09-27 (×2): qty 90, 90d supply, fill #0
  Filled 2023-12-26: qty 90, 90d supply, fill #1

## 2023-08-26 MED ORDER — EMPAGLIFLOZIN 10 MG PO TABS
10.0000 mg | ORAL_TABLET | Freq: Every day | ORAL | 3 refills | Status: DC
  Filled 2023-08-26: qty 90, 90d supply, fill #0

## 2023-08-26 NOTE — ED Triage Notes (Signed)
 Chief Complaint: abdominal pain, vaginal discomfort, vaginal discharge (clear white) and ear pain. States she recently had sex for the first time in 3 months with her Boy friend. Wanting to be checked. Want the swab and blood work. Pain in both ears as well.   Sick exposure: No  Onset: 4 days ago.   Prescriptions or OTC medications tried: No

## 2023-08-26 NOTE — Discharge Instructions (Signed)
 Your urine dip was negative for signs of an infection.  Your urine pregnancy test was negative. We are still awaiting the results of your STD testing.  We will keep you updated with those results once they are available and let you know if you need to start any medications if there are positive results.  Please refrain from sexual activity until you have received a negative test result or you have completed an appropriate medication regimen.  Please use a condom or another appropriate barrier method to help prevent STD transmission. We have provided you with an albuterol inhaler to help with your shortness of breath.  You can use this up to every 6 hours as needed for shortness of breath and coughing.  If you start to develop more significant shortness of breath, difficulty breathing, fever, chills, chest pain please go to the emergency room as these could be signs of a medical emergency.

## 2023-08-26 NOTE — Telephone Encounter (Signed)
 Pt called back and is willing to come in to see DOD, Dr. Lynnette Caffey today 3:00.

## 2023-08-26 NOTE — Telephone Encounter (Signed)
 Left message for pt to call back, as DOD, Dr. Lynnette Caffey has a "same day" slot open today.  Was going to offer to pt if she calls back, please schedule if still available.

## 2023-08-26 NOTE — Telephone Encounter (Signed)
 Spoke with patient and advised nothing available this afternoon Offered her appointment Monday at [facility] location with Eula Fried NP, declined appointment Will reach out to scheduling team to call and get appointment scheduled

## 2023-08-26 NOTE — ED Provider Notes (Signed)
 [facility]    CSN: [id number] Arrival date & time: 08/26/23  1546      History   Chief Complaint Chief Complaint  Patient presents with   Abdominal Pain    HPI Chelsea Branch is a 58 y.o. female.   HPI  She reports she has been having ear pain She states she is having some abdominal pain and diarrhea She states she just recently had sex for the first time in about a year with her boyfriend and she is having genital discomfort and soreness She denies rashes or genital sores She is having more volume in vaginal discharge- reports it is clear and white sometimes She reports some mild discomfort with urination    She reports she is having some SOB with exertion  She states she just saw her Cardiologist to help with fluid accumulation  She has been out of her rescue inhaler so she has not been able to use it. She did not pick up the one that was sent in 08/17/23     Past Medical History:  Diagnosis Date   Abnormal thyroid blood test 03/15/2017   Abnormal uterine bleeding    Alopecia    Anemia    Angina pectoris with normal coronary arteriogram (HCC) 2017   Had + Troponin c/w ? 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 iniatially ?2/2 peripartum in 1994 - improved by 2008 then worsening EF in 2011 back down to EF 25-30%. b. echo 01/21/14 showed mod LVH, [name]; 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) 04/28/2023   History of cerebellar stroke 04/13/2023   History of stroke 04/11/2023   Liletta IUD (intrauterine device) in place since 12/15/2015 04/06/2023   Chronic pain syndrome 12/28/2021   Primary osteoarthritis of both knees 12/28/2021   Spinal stenosis of lumbar region 07/21/2021   Lumbar radiculitis 07/21/2021   COPD (chronic obstructive pulmonary disease) (HCC) 07/21/2021   Mechanical complication due to intrauterine contraceptive device 07/21/2021   Arthropathy of lumbar facet joint 03/14/2019   Degenerative disc disease at L5-S1 level 10/10/2018   Hyperthyroidism 04/26/2016   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   History of non-ST elevation myocardial infarction (NSTEMI) 06/14/2015   Homelessness 09/16/2014   Vitamin D deficiency 01/29/2014   OSA (obstructive sleep apnea) 01/08/2014   Chronic combined systolic and diastolic CHF (congestive heart failure) (HCC) 08/14/2013   Essential hypertension    Fibroids 08/09/2012   Menorrhagia 05/08/2012   Female pattern hair loss  HLD (hyperlipidemia) 06/08/2010   CEREBRAL ANEURYSM 06/08/2010   History of cardiovascular disorder 06/08/2010    Past Surgical History:  Procedure Laterality Date   CARDIAC CATHETERIZATION N/A 06/16/2015   Procedure: Left Heart Cath and Coronary Angiography;  Surgeon: Corky Crafts, MD;  Location: [facility] INVASIVE CV LAB;  Service: Cardiovascular;  Laterality: N/A;   CESAREAN SECTION  1992  1994   INTRAUTERINE DEVICE (IUD) INSERTION     about 3 years ago   LOOP RECORDER IMPLANT  ~ 2000   spine injections      [place]  [facility],  Dr Lorrine Kin   TIBIAL TUBERCLERPLASTY     TRANSESOPHAGEAL ECHOCARDIOGRAM (CATH LAB) N/A 04/20/2023   Procedure: TRANSESOPHAGEAL ECHOCARDIOGRAM;  Surgeon: [name], MD;  Location: [facility] INVASIVE CV LAB;  Service: Cardiovascular;  Laterality: N/A;   TUBAL LIGATION  05/24/1992    OB History     Gravida  2   Para  2   Term  2   Preterm      AB      Living  3      SAB      IAB      Ectopic      Multiple  1   Live Births  3            Home Medications    Prior to Admission medications   Medication Sig Start Date End Date Taking? Authorizing Provider  aspirin 81 MG chewable tablet Chew 1 tablet (81 mg total) by mouth daily. 04/14/23  Yes Peterson Ao, MD  atorvastatin (LIPITOR) 40 MG tablet Take 1 tablet (40 mg total) by mouth daily. 04/28/23  Yes [name], MD  empagliflozin (JARDIANCE) 10 MG TABS tablet Take 1 tablet (10 mg total) by mouth daily before breakfast. 08/26/23  Yes Orbie Pyo, MD  ketoconazole (NIZORAL) 2 % shampoo Apply 1 Application topically 2 (two) times a week. 08/18/23  Yes Claiborne Rigg, NP  Melatonin 10 MG TABS Take 10 mg by mouth at bedtime. 08/17/23  Yes Claiborne Rigg, NP  methimazole (TAPAZOLE) 5 MG tablet TAKE 1 TABLET BY MOUTH TWICE A DAY 07/25/23  Yes [name], MD  Polyethylene Glycol 4500 POWD 17 g by Does not apply route daily at 6 (six) AM. Dissolve 1 capful of powder in one 8 ounce glass of water. Repeat daily or as needed to soften stool Patient taking differently: 17 g by Does not apply route daily as needed (constipation). 04/10/23  Yes Rising, Lurena Joiner, PA-C  potassium chloride SA (KLOR-CON M20) 20 MEQ tablet Take 2 tablets (40 mEq total) by mouth daily. 04/28/23  Yes Newlin, Odette Horns, MD  sacubitril-valsartan (ENTRESTO) 49-51 MG Take 1 tablet by mouth 2 (two) times daily. 08/26/23  Yes Orbie Pyo, MD  spironolactone (ALDACTONE) 25 MG tablet Take 1 tablet (25 mg total) by mouth daily. 08/26/23  Yes  Orbie Pyo, MD  torsemide (DEMADEX) 20 MG tablet Take 1 tablet (20 mg total) by mouth daily. 04/28/23  Yes [name], MD  acetaminophen (TYLENOL) 500 MG tablet Take 1,000 mg by mouth as needed for mild pain (pain score 1-3) or moderate pain (pain score 4-6).    [provider]  albuterol (VENTOLIN HFA) 108 (90 Base) MCG/ACT inhaler Inhale 2 puffs into the lungs every 4 (four) hours as needed for wheezing or shortness of breath. 08/17/23   Claiborne Rigg, NP  empagliflozin (JARDIANCE) 10 MG TABS tablet Take 1 tablet (10 mg total)  by mouth daily before breakfast. 08/26/23   Orbie Pyo, MD  fluticasone (FLONASE) 50 MCG/ACT nasal spray Place 2 sprays into both nostrils daily. 04/10/23   Rising, Lurena Joiner, PA-C  metoprolol succinate (TOPROL-XL) 50 MG 24 hr tablet Take 1.5 tablets (75 mg total) by mouth daily. Take with or immediately following a meal. 08/26/23   Orbie Pyo, MD    Family History Family History  Problem Relation Age of Onset   Healthy Mother    Stroke Sister    Hypertension Sister    Cancer Maternal Grandmother        uterine   Other Neg Hx    Heart disease Neg Hx     Social History Social History   Tobacco Use   Smoking status: Former    Current packs/day: 0.00    Average packs/day: 0.1 packs/day for 30.0 years (3.0 ttl pk-yrs)    Types: Cigarettes    Start date: 06/02/1985    Quit date: 06/03/2015    Years since quitting: 8.2   Smokeless tobacco: Never   Tobacco comments:    Pt. stated she stopped smoking a year ago. 09/29/2018  Vaping Use   Vaping status: Never Used  Substance Use Topics   Alcohol use: No    Alcohol/week: 0.0 standard drinks of alcohol   Drug use: No     Allergies   Ace inhibitors   Review of Systems Review of Systems  Respiratory:  Positive for shortness of breath.   Gastrointestinal:  Positive for abdominal pain and diarrhea.  Genitourinary:  Positive for dysuria, vaginal discharge and vaginal pain.      Physical Exam Triage Vital Signs ED Triage Vitals  Encounter Vitals Group     BP 08/26/23 1625 104/72     Systolic BP Percentile --      Diastolic BP Percentile --      Pulse Rate 08/26/23 1625 81     Resp 08/26/23 1625 18     Temp 08/26/23 1625 97.9 F (36.6 C)     Temp Source 08/26/23 1625 Oral     SpO2 08/26/23 1625 97 %     Weight 08/26/23 1624 200 lb (90.7 kg)     Height 08/26/23 1624 5\' 8"  (1.727 m)     Head Circumference --      Peak Flow --      Pain Score 08/26/23 1621 8     Pain Loc --      Pain Education --      Exclude from Growth Chart --    No data found.  Updated Vital Signs BP 104/72 (BP Location: Right Arm)   Pulse 81   Temp 97.9 F (36.6 C) (Oral)   Resp 18   Ht 5\' 8"  (1.727 m)   Wt 200 lb (90.7 kg)   LMP  (LMP Unknown)   SpO2 97%   BMI 30.41 kg/m   Visual Acuity Right Eye Distance:   Left Eye Distance:   Bilateral Distance:    Right Eye Near:   Left Eye Near:    Bilateral Near:     Physical Exam Vitals reviewed.  Constitutional:      General: She is awake.     Appearance: Normal appearance. She is well-developed and well-groomed.  HENT:     Head: Normocephalic and atraumatic.  Pulmonary:     Effort: Pulmonary effort is normal.     Breath sounds: Normal breath sounds.  Abdominal:     General: Abdomen is  flat. Bowel sounds are normal.     Palpations: Abdomen is soft.     Tenderness: There is abdominal tenderness in the suprapubic area.  Musculoskeletal:     Cervical back: Normal range of motion.  Skin:    General: Skin is warm and dry.  Neurological:     General: No focal deficit present.     Mental Status: She is alert and oriented to person, place, and time.  Psychiatric:        Mood and Affect: Mood normal.        Behavior: Behavior normal. Behavior is cooperative.        Thought Content: Thought content normal.        Judgment: Judgment normal.      UC Treatments / Results  Labs (all labs ordered are listed,  but only abnormal results are displayed) Labs Reviewed  POCT URINALYSIS DIP (MANUAL ENTRY) - Abnormal; Notable for the following components:      Result Value   Color, UA straw (*)    Clarity, UA hazy (*)    Protein Ur, POC =100 (*)    All other components within normal limits  POCT URINE PREGNANCY - Normal  HIV ANTIBODY (ROUTINE TESTING W REFLEX)  RPR  CERVICOVAGINAL ANCILLARY ONLY    EKG   Radiology No results found.  Procedures Procedures (including critical care time)  Medications Ordered in UC Medications  albuterol (VENTOLIN HFA) 108 (90 Base) MCG/ACT inhaler 2 puff (2 puffs Inhalation Given 08/26/23 1719)    Initial Impression / Assessment and Plan / UC Course  I have reviewed the triage vital signs and the nursing notes.  Pertinent labs & imaging results that were available during my care of the patient were reviewed by me and considered in my medical decision making (see chart for details).      Final Clinical Impressions(s) / UC Diagnoses   Final diagnoses:  Lower abdominal pain  Dysuria  Vaginal discharge  SOB (shortness of breath) on exertion    Patient presents today with concerns for suprapubic pain, changes to vaginal discharge, mild dysuria.  She reports that she was recently sexually active for the first time in about 12 months and thinks that she may have been exposed to something.  Will get cervicovaginal swab, RPR, HIV testing.  Reviewed that test results for gonorrhea, chlamydia, yeast, BV, trichomonas, HIV, syphilis will take several days to return and we will keep her updated with these results.  Results to dictate further management.  Recommend refraining from sexual activity until test results are negative or she is completed an appropriate medication regimen.  Recommend using a condom or another barrier method to help prevent STD transmission.  Patient also reports some shortness of breath on exertion.  She states that this has been ongoing and  she does not have her rescue inhaler.  She reports that she is not able to get to the pharmacy to pick this up due to transportation limitations.  Will provide albuterol inhaler here today for her to use and recommend follow-up with PCP for ongoing medication management.  ED return precautions were reviewed and provided in after visit summary.  Follow-up as needed.     Discharge Instructions      Your urine dip was negative for signs of an infection.  Your urine pregnancy test was negative. We are still awaiting the results of your STD testing.  We will keep you updated with those results once they are available and let  you know if you need to start any medications if there are positive results.  Please refrain from sexual activity until you have received a negative test result or you have completed an appropriate medication regimen.  Please use a condom or another appropriate barrier method to help prevent STD transmission. We have provided you with an albuterol inhaler to help with your shortness of breath.  You can use this up to every 6 hours as needed for shortness of breath and coughing.  If you start to develop more significant shortness of breath, difficulty breathing, fever, chills, chest pain please go to the emergency room as these could be signs of a medical emergency.      ED Prescriptions   None    PDMP not reviewed this encounter.   Providence Crosby, PA-C 08/26/23 1727

## 2023-08-26 NOTE — Telephone Encounter (Signed)
 Pharmacy Patient Advocate Encounter   Received notification from Onbase that prior authorization for jardiance is required/requested.   Insurance verification completed.   The patient is insured through  [employer] med[employer]  .   Per test claim: PA required; PA submitted to above mentioned insurance via CoverMyMeds Key/confirmation #/EOC [id number] Status is pending

## 2023-08-26 NOTE — Patient Instructions (Addendum)
 Medication Instructions:  Your physician has recommended you make the following change in your medication:   1) INCREASE metoprolol succinate (Toprol XL) to 75 mg daily 2) INCREASE Entresto to 49-51mg  twice daily 3) INCREASE spironolactone to 25 mg daily 4) INCREASE torsemide (Demadex) to 20 mg twice daily for 3 days then resume taking once daily 5) START empagliflozin (Jardiance) 10 mg daily before breakfast  *If you need a refill on your cardiac medications before your next appointment, please call your pharmacy*  Lab Work: Next week at [facility]: BMP If you have labs (blood work) drawn today and your tests are completely normal, you will receive your results only by: MyChart Message (if you have MyChart) OR A paper copy in the mail If you have any lab test that is abnormal or we need to change your treatment, we will call you to review the results.  Follow-Up: At [facility], you and your health needs are our priority.  As part of our continuing mission to provide you with exceptional heart care, we have created designated Provider Care Teams.  These Care Teams include your primary Cardiologist (physician) and Advanced Practice Providers (APPs -  Physician Assistants and Nurse Practitioners) who all work together to provide you with the care you need, when you need it.  We recommend signing up for the patient portal called "MyChart".  Sign up information is provided on this After Visit Summary.  MyChart is used to connect with patients for Virtual Visits (Telemedicine).  Patients are able to view lab/test results, encounter notes, upcoming appointments, etc.  Non-urgent messages can be sent to your provider as well.   To learn more about what you can do with MyChart, go to [email or web address].    Your next appointment:   2 week(s)  The format for your next appointment:   In Person  Provider:   Tereso Newcomer, PA-C       Other Instructions   1st Floor: - Lobby -  Registration  - Pharmacy  - Lab - Cafe  2nd Floor: - PV Lab - Diagnostic Testing (echo, CT, nuclear med)  3rd Floor: - Vacant  4th Floor: - TCTS (cardiothoracic surgery) - AFib Clinic - Structural Heart Clinic - Vascular Surgery  - Vascular Ultrasound  5th Floor: - HeartCare Cardiology (general and EP) - Clinical Pharmacy for coumadin, hypertension, lipid, weight-loss medications, and med management appointments    Valet parking services will be available as well.

## 2023-08-26 NOTE — Telephone Encounter (Signed)
 Pt no showed her appt this morning and wants nurse to call her back to see if she can come this afternoon

## 2023-08-26 NOTE — Progress Notes (Signed)
 Cardiology Office Note:   Date:  08/26/2023  ID:  Chelsea Branch, DOB 12-05-1965, MRN [id number] PCP:  [name], MD  [facility] Providers Cardiologist: Graciela Husbands DOD cardiologist:  Alverda Skeans, MD Referring MD: [name], MD  Chief Complaint/Reason for Referral: DOD visit for shortness of breath ASSESSMENT:    1. Nonischemic dilated cardiomyopathy (HCC)   2. History of CVA (cerebrovascular accident)   3. Hyperlipidemia LDL goal <70   4. Aortic atherosclerosis (HCC)   5. BMI 30.0-30.9,adult     PLAN:   In order of problems listed above: Nonischemic cardiomyopathy: Volume overloaded today.  Increase Toprol to 75 mg daily, increase Entresto to mid dose, increase spironolactone to 25 mg, start Jardiance 10 mg daily.  Change Demadex to 20 mg 3 times daily for 3 days then back to qday.  BMP next week. History of stroke: Bubble study echocardiogram negative. Hyperlipidemia: Continue atorvastatin 40 mg daily; LDL in February was 55. Aortic atherosclerosis: Continue aspirin 81 mg, atorvastatin 40 mg, strict blood pressure control. Elevated BMI: Hemoglobin A1c in November was 6.3; does not qualify for GLP-1 receptor agonist therapy for an indication of type 2 diabetes.            Dispo:  Return in about 2 weeks (around 09/09/2023).      Medication Adjustments/Labs and Tests Ordered: Current medicines are reviewed at length with the patient today.  Concerns regarding medicines are outlined above.  The following changes have been made:     Labs/tests ordered: Orders Placed This Encounter  Procedures   Basic metabolic panel with GFR    Medication Changes: Meds ordered this encounter  Medications   sacubitril-valsartan (ENTRESTO) 49-51 MG    Sig: Take 1 tablet by mouth 2 (two) times daily.    Dispense:  90 tablet    Refill:  3   metoprolol succinate (TOPROL-XL) 50 MG 24 hr tablet    Sig: Take 1.5 tablets (75 mg total) by mouth daily. Take with or immediately  following a meal.    Dispense:  135 tablet    Refill:  1   spironolactone (ALDACTONE) 25 MG tablet    Sig: Take 1 tablet (25 mg total) by mouth daily.    Dispense:  90 tablet    Refill:  1   empagliflozin (JARDIANCE) 10 MG TABS tablet    Sig: Take 1 tablet (10 mg total) by mouth daily before breakfast.    Dispense:  90 tablet    Refill:  3   empagliflozin (JARDIANCE) 10 MG TABS tablet    Sig: Take 1 tablet (10 mg total) by mouth daily before breakfast.    Dispense:  28 tablet    Lot Number?:   [id number]    Expiration Date?:   05/23/2025    Current medicines are reviewed at length with the patient today.  The patient does not have concerns regarding medicines.  I spent 33 minutes reviewing all clinical data during and prior to this visit including all relevant imaging studies, laboratories, clinical information from other health systems and prior notes from both Cardiology and other specialties, interviewing the patient, conducting a complete physical examination, and coordinating care in order to formulate a comprehensive and personalized evaluation and treatment plan.   History of Present Illness:      FOCUSED PROBLEM LIST:   Nonischemic cardiomyopathy Peripartum cardiomyopathy EF 25 to 30% 2015 EF 30 to 35% TTE January 2025 History of stroke No intracardiac shunt bubble study echocardiogram January 2025 Hypothyroidism Hyperlipidemia  Aortic atherosclerosis Lumbar spine film 2019 BMI 21 September 2023 DOD visit:  Patient consents to use of AI scribe. Patient is here for expedited DOD visit.  She has been experiencing worsening symptoms following a stroke and ongoing heart issues. Since the stroke, hse has not felt well and has been concerned about his heart-related symptoms.  She describes significant swelling in his knees, present for about two weeks, making walking difficult. She reports gaining weight, particularly in his stomach and knees, with a total weight gain of about ten  pounds over the last couple of months, which hse attributes to fluid retention. She experiences early satiety when eating and describes a burning sensation in his chest, particularly on the right side. She feels more tired and has difficulty breathing at night, requiring two pillows to sleep comfortably. No blacking out spells since his strokes and no palpitations are reported.  She denies having urinary tract infections or yeast infections.          Current Medications: Current Meds  Medication Sig   acetaminophen (TYLENOL) 500 MG tablet Take 1,000 mg by mouth as needed for mild pain (pain score 1-3) or moderate pain (pain score 4-6).   albuterol (VENTOLIN HFA) 108 (90 Base) MCG/ACT inhaler Inhale 2 puffs into the lungs every 4 (four) hours as needed for wheezing or shortness of breath.   aspirin 81 MG chewable tablet Chew 1 tablet (81 mg total) by mouth daily.   atorvastatin (LIPITOR) 40 MG tablet Take 1 tablet (40 mg total) by mouth daily.   empagliflozin (JARDIANCE) 10 MG TABS tablet Take 1 tablet (10 mg total) by mouth daily before breakfast.   empagliflozin (JARDIANCE) 10 MG TABS tablet Take 1 tablet (10 mg total) by mouth daily before breakfast.   fluticasone (FLONASE) 50 MCG/ACT nasal spray Place 2 sprays into both nostrils daily.   ketoconazole (NIZORAL) 2 % shampoo Apply 1 Application topically 2 (two) times a week.   Melatonin 10 MG TABS Take 10 mg by mouth at bedtime.   methimazole (TAPAZOLE) 5 MG tablet TAKE 1 TABLET BY MOUTH TWICE A DAY   metoprolol succinate (TOPROL-XL) 50 MG 24 hr tablet Take 1.5 tablets (75 mg total) by mouth daily. Take with or immediately following a meal.   Polyethylene Glycol 4500 POWD 17 g by Does not apply route daily at 6 (six) AM. Dissolve 1 capful of powder in one 8 ounce glass of water. Repeat daily or as needed to soften stool (Patient taking differently: 17 g by Does not apply route daily as needed (constipation).)   potassium chloride SA (KLOR-CON  M20) 20 MEQ tablet Take 2 tablets (40 mEq total) by mouth daily.   sacubitril-valsartan (ENTRESTO) 49-51 MG Take 1 tablet by mouth 2 (two) times daily.   spironolactone (ALDACTONE) 25 MG tablet Take 1 tablet (25 mg total) by mouth daily.   torsemide (DEMADEX) 20 MG tablet Take 1 tablet (20 mg total) by mouth daily.   [DISCONTINUED] metoprolol succinate (TOPROL-XL) 50 MG 24 hr tablet Take 1 tablet (50 mg total) by mouth daily.   [DISCONTINUED] sacubitril-valsartan (ENTRESTO) 24-26 MG Take 1 tablet by mouth 2 (two) times daily.   [DISCONTINUED] spironolactone (ALDACTONE) 25 MG tablet Take 0.5 tablets (12.5 mg total) by mouth daily.     Review of Systems:   Please see the history of present illness.    All other systems reviewed and are negative.     EKGs/Labs/Other Test Reviewed:   EKG: EKG from January 2025  demonstrates sinus rhythm with left ventricular hypertrophy  EKG Interpretation Date/Time:    Ventricular Rate:    PR Interval:    QRS Duration:    QT Interval:    QTC Calculation:   R Axis:      Text Interpretation:           Risk Assessment/Calculations:          Physical Exam:   VS:  BP 122/78   Pulse 88   Ht 5\' 8"  (1.727 m)   Wt 198 lb 6.4 oz (90 kg)   SpO2 97%   BMI 30.17 kg/m        Wt Readings from Last 3 Encounters:  08/26/23 198 lb 6.4 oz (90 kg)  08/17/23 205 lb (93 kg)  07/12/23 205 lb (93 kg)      GENERAL:  No apparent distress, AOx3 HEENT:  No carotid bruits, +2 carotid impulses, no scleral icterus, JVP not elevated CAR: RRR no murmurs, gallops, rubs, or thrills RES:  Clear to auscultation bilaterally ABD:  Soft, nontender, nondistended, positive bowel sounds x 4 VASC:  +2 radial pulses, +2 carotid pulses NEURO:  CN 2-12 grossly intact; motor and sensory grossly intact PSYCH:  No active depression or anxiety EXT:  No edema, ecchymosis, or cyanosis  Signed, Orbie Pyo, MD  08/26/2023 3:31 PM    [facility] [address] [place], [place], [place]  [address] Phone: [phone]; Fax: [phone]   Note:  This document was prepared using Dragon voice recognition software and may include unintentional dictation errors.

## 2023-08-27 LAB — RPR: RPR Ser Ql: NONREACTIVE

## 2023-08-29 ENCOUNTER — Ambulatory Visit (HOSPITAL_BASED_OUTPATIENT_CLINIC_OR_DEPARTMENT_OTHER): Admitting: Nurse Practitioner

## 2023-08-29 ENCOUNTER — Other Ambulatory Visit (HOSPITAL_COMMUNITY): Payer: Self-pay

## 2023-08-29 ENCOUNTER — Other Ambulatory Visit: Payer: Self-pay

## 2023-08-29 ENCOUNTER — Ambulatory Visit: Admitting: Physical Therapy

## 2023-08-29 DIAGNOSIS — M6281 Muscle weakness (generalized): Secondary | ICD-10-CM

## 2023-08-29 DIAGNOSIS — R296 Repeated falls: Secondary | ICD-10-CM

## 2023-08-29 DIAGNOSIS — R269 Unspecified abnormalities of gait and mobility: Secondary | ICD-10-CM

## 2023-08-29 LAB — CERVICOVAGINAL ANCILLARY ONLY
Bacterial Vaginitis (gardnerella): NEGATIVE
Candida Glabrata: NEGATIVE
Candida Vaginitis: NEGATIVE
Chlamydia: NEGATIVE
Comment: NEGATIVE
Comment: NEGATIVE
Comment: NEGATIVE
Comment: NEGATIVE
Comment: NEGATIVE
Comment: NORMAL
Neisseria Gonorrhea: NEGATIVE
Trichomonas: NEGATIVE

## 2023-08-29 NOTE — Therapy (Signed)
 [facility] [address] [place], [place], [address] Phone: [phone]   Fax:  [phone]  Patient Details  Name: Chelsea Branch MRN: [id number] Date of Birth: November 24, 1965 Referring Provider:  Micki Riley, MD  Encounter Date: 08/29/2023  Session was arrive no charge. Patient reports not feeling well today and asks if we can reschedule visit. Physical therapist does so and reschedules to Thursday. Anticipate D/C next session due to further medical testing.   Carmelia Bake, PT, DPT 08/29/2023, 2:14 PM  [facility] [address] [place], [place], [address] Phone: [phone]   Fax:  [phone]

## 2023-08-29 NOTE — Telephone Encounter (Signed)
 Pharmacy Patient Advocate Encounter  Received notification from  [employer]  that Prior Authorization for jardiance has been APPROVED from 08/26/23 to 08/25/24. Spoke to pharmacy to process.Copay is $4.00.    PA #/Case ID/Reference #: [id number]

## 2023-08-29 NOTE — Progress Notes (Deleted)
 Cardiology Clinic Note   Patient Name: Chelsea Branch Date of Encounter: 08/29/2023  Primary Care Provider:  [name], MD Primary Cardiologist:  Sherryl Manges, MD  Patient Profile    Chelsea Branch 58 year old female presents the clinic today for follow-up evaluation of her chronic combined systolic and diastolic CHF.  Past Medical History    Past Medical History:  Diagnosis Date   Abnormal thyroid blood test 03/15/2017   Abnormal uterine bleeding    Alopecia    Anemia    Angina pectoris with normal coronary arteriogram (HCC) 2017   Had + Troponin c/w ? 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 iniatially ?2/2 peripartum in 1994 - improved by 2008 then worsening EF in 2011 back down to EF 25-30%. b. echo 01/21/14 showed mod LVH, [name]; 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   Peripartum cardiomyopathy 1994   Sleep apnea 2015   CPAP 12/2013   Stroke (HCC) 2011   Systolic and diastolic CHF, acute on chronic (HCC) 06/14/2015   Tobacco abuse    Past Surgical History:  Procedure Laterality Date   CARDIAC CATHETERIZATION N/A 06/16/2015   Procedure: Left Heart Cath and Coronary Angiography;  Surgeon: Corky Crafts, MD;  Location: [facility] INVASIVE CV LAB;  Service: Cardiovascular;  Laterality: N/A;   CESAREAN SECTION  1992  1994   INTRAUTERINE DEVICE (IUD) INSERTION     about 3 years ago   LOOP RECORDER IMPLANT  ~ 2000   spine injections      [facility],  Dr Lorrine Kin   TIBIAL  TUBERCLERPLASTY     TRANSESOPHAGEAL ECHOCARDIOGRAM (CATH LAB) N/A 04/20/2023   Procedure: TRANSESOPHAGEAL ECHOCARDIOGRAM;  Surgeon: [name], MD;  Location: [facility] INVASIVE CV LAB;  Service: Cardiovascular;  Laterality: N/A;   TUBAL LIGATION  05/24/1992    Allergies  Allergies  Allergen Reactions   Ace Inhibitors Cough    History of Present Illness    Chelsea Branch has a PMH of CVA, HTN, HLD, NIDCM (presumed peripartum, chronic CHF) and chronic combined systolic and diastolic CHF.  She had an NSTEMI in 2017.  She was noted to have nonobstructive coronary disease per cath.  Her PMH also includes OSA on CPAP.  She was seen in follow-up by EP on 10/04/2022.  During that time she reported fatigue and malaise for around 6-8 weeks.  She also had associated general body aches x 1 month.  She denied acute illnesses or insect bites.  She reported that she had not been seen by other providers for her concerns.  She was taking her medications as directed.  She did note some relief with Tylenol.  She denied chest pain, dyspnea, syncope, weight gain, and early satiety.  Her blood pressure was 138/88.  Her volume status was stable.  Follow-up was planned in 6 months with Dr. Graciela Husbands.  She was NYHA class II-3.  She was admitted to the hospital 04/11/2023 and discharged on 04/13/2023.  She presented  to the emergency department and reported a change in her mental status.  She noted headache, dry mouth, blurred vision while at work.  She was able to describe all of her symptoms while in triage with no aphasia or dysarthria.  Her vision was improving.  She was taken for CTA/CT which was negative.  At 1427 on 04/11/2023 she had a change in her neurological status.  She was noted to have expressive aphasia right upper arm weakness, confusion, and receptive as well as expressive aphasia.  She was diagnosed with acute infarct in the right submental lobe, posterior aspect of left insula and left cerebellar hemisphere.  She  received thrombin therapy.  TNK was given at 1502.  MRI showed acute infarcts of her right occipital lobe, posterior aspect of the left insulae and left cerebellar hemisphere.  Echocardiogram showed an LVEF of 30-35%, moderately decreased LVH, mild concentric LVH, G3 DD, and severely dilated left atria.  A TEE was ordered.  However, patient ate prior to procedure and she was encouraged to have TEE done on 04/18/2023.  Her urine drug screen was negative.  She was started on aspirin and Plavix with a plan to continue aspirin alone after 3 weeks.  Her potassium was noted to be 3.1.  She received supplemental potassium.  Her blood pressure was noted to be 114/100.  Outpatient physical therapy and speech therapy were recommended.  She presented to the clinic 04/18/23 for follow-up evaluation and stated her stroke had affected her speech.  She reported that she wanted to help with cooking Thanksgiving dinner.  She felt that she was ready to get back to work.  We reviewed her recent hospitalization and her stroke.  She expressed understanding.  We reviewed the importance of completing physical therapy and speech therapy.  We reviewed her upcoming appointments and need for transesophageal echocardiogram.  She expressed understanding.  I ordered TEE and planned for her to follow-up with Dr. Graciela Husbands as scheduled.  She was seen in follow-up by Dr. Graciela Husbands 06/21/2023 and Dr.Thukkani 08/26/2023.  She was noted to be fluid volume overloaded.  Her metoprolol was increased to 75 mg daily.  Her Entresto was increased to mid dose.  Her spironolactone was increased to 25.  Jardiance was started at 10 mg daily.  Her diuretic was changed to Demadex 20 mg 3 times daily for 3 days and then plan to resume 20 mg daily dosing.  She presents to the clinic today for follow-up evaluation and states***.  Today she denies chest pain, shortness of breath, lower extremity edema, fatigue, palpitations, melena, hematuria, hemoptysis, diaphoresis,  weakness, presyncope, syncope, orthopnea, and PND.   Home Medications    Prior to Admission medications   Medication Sig Start Date End Date Taking? Authorizing Provider  acetaminophen (TYLENOL) 500 MG tablet Take 1,000 mg by mouth as needed for mild pain (pain score 1-3) or moderate pain (pain score 4-6).    [provider]  albuterol (VENTOLIN HFA) 108 (90 Base) MCG/ACT inhaler Inhale 2 puffs into the lungs every 4 (four) hours as needed for wheezing or shortness of breath. 01/12/23   Storm Frisk, MD  aspirin 81 MG chewable tablet Chew 1 tablet (81 mg total) by mouth daily. 04/14/23   Peterson Ao, MD  atorvastatin (LIPITOR) 40 MG tablet Take 1 tablet (40 mg total) by mouth daily. 04/14/23   Peterson Ao, MD  clopidogrel (PLAVIX) 75 MG tablet Take 1 tablet (75 mg total) by mouth daily for 20 days. 04/14/23 05/04/23  Peterson Ao, MD  fluticasone [facility]) 50 MCG/ACT nasal spray Place 2 sprays into both nostrils daily. 04/10/23   Rising, Lurena Joiner, PA-C  metoprolol succinate (TOPROL-XL) 50 MG 24 hr tablet Take 1 tablet (50 mg total) by mouth daily. Please keep upcoming appointment in April 2024 for future refills. Thank you Patient taking differently: Take 75 mg by mouth daily. 01/12/23   Storm Frisk, MD  Polyethylene Glycol 4500 POWD 17 g by Does not apply route daily at 6 (six) AM. Dissolve 1 capful of powder in one 8 ounce glass of water. Repeat daily or as needed to soften stool Patient taking differently: 17 g by Does not apply route daily as needed (constipation). 04/10/23   Rising, Lurena Joiner, PA-C  potassium chloride SA (KLOR-CON M20) 20 MEQ tablet TAKE 2 TABLETS IN THE MORNING AND 1 TABLET IN THE EVENING AS DIRECTED Patient taking differently: Take 40 mEq by mouth daily. 01/12/23   Storm Frisk, MD  sacubitril-valsartan (ENTRESTO) 24-26 MG Take 1 tablet by mouth 2 (two) times daily. Patient taking differently: Take 1 tablet by mouth daily. 01/12/23   Storm Frisk, MD  spironolactone (ALDACTONE) 25 MG tablet Take 0.5 tablets (12.5 mg total) by mouth daily. 01/12/23   Storm Frisk, MD  torsemide (DEMADEX) 20 MG tablet Take 1 tablet (20 mg total) by mouth daily. 01/12/23   Storm Frisk, MD  DULoxetine (CYMBALTA) 30 MG capsule Take 1 capsule (30 mg total) by mouth daily. 12/07/18 12/10/19  [name], MD  famotidine (PEPCID) 20 MG tablet Take 1 tablet (20 mg total) 2 (two) times daily by mouth. 04/10/17 11/03/19  Elisha Ponder, PA-C    Family History    Family History  Problem Relation Age of Onset   Healthy Mother    Stroke Sister    Hypertension Sister    Cancer Maternal Grandmother        uterine   Other Neg Hx    Heart disease Neg Hx    She indicated that her mother is alive. She indicated that her father is deceased. She indicated that her sister is alive. She indicated that her maternal grandmother is deceased. She indicated that her maternal grandfather is deceased. She indicated that her paternal grandmother is deceased. She indicated that her paternal grandfather is deceased. She indicated that the status of her neg hx is unknown.  Social History    Social History   Socioeconomic History   Marital status: Single    Spouse name: Not on file   Number of children: 3   Years of education: 12   Highest education level: Not on file  Occupational History   Occupation: unemployed  Tobacco Use   Smoking status: Former    Current packs/day: 0.00    Average packs/day: 0.1 packs/day for 30.0 years (3.0 ttl pk-yrs)    Types: Cigarettes    Start date: 06/02/1985    Quit date: 06/03/2015    Years since quitting: 8.2   Smokeless tobacco: Never   Tobacco comments:    Pt. stated she stopped smoking a year ago. 09/29/2018  Vaping Use   Vaping status: Never Used  Substance and Sexual Activity   Alcohol use: No    Alcohol/week: 0.0 standard drinks of alcohol   Drug use: No   Sexual activity: Yes    Birth  control/protection: I.U.D.    Comment: fibroids  Other Topics Concern   Not on file  Social History Narrative   Lives at  home with 59 yo twins (Diwan and DiQuan)   1 yo son lives outside the home   46 yo granddaughter       Update 07/12/2023   Stays with her mother, sister, friend here and there, lost her home, signing up for social security, has been sleeping in her car but she doesn't have it anymore. She does not want to go to a shelter. She was working in [employer] work until she had her stroke in November 2024. She gets food stamps and medicaid.   Social Drivers of Health   Financial Resource Strain: High Risk (07/06/2023)   Overall Financial Resource Strain (CARDIA)    Difficulty of Paying Living Expenses: Very hard  Food Insecurity: Food Insecurity Present (07/06/2023)   Hunger Vital Sign    Worried About Running Out of Food in the Last Year: Often true    Ran Out of Food in the Last Year: Sometimes true  Transportation Needs: Unmet Transportation Needs (07/06/2023)   PRAPARE - Transportation    Lack of Transportation (Medical): Yes    Lack of Transportation (Non-Medical): Yes  Physical Activity: Not on file  Stress: Stress Concern Present (07/06/2023)   [employer] of Occupational Health - Occupational Stress Questionnaire    Feeling of Stress : Rather much  Social Connections: Moderately Integrated (07/06/2023)   Social Connection and Isolation Panel [NHANES]    Frequency of Communication with Friends and Family: Three times a week    Frequency of Social Gatherings with Friends and Family: Once a week    Attends Religious Services: More than 4 times per year    Active Member of [employer] or Organizations: No    Attends Banker Meetings: Never    Marital Status: Living with partner  Intimate Partner Violence: Not At Risk (04/25/2023)   Humiliation, Afraid, Rape, and Kick questionnaire    Fear of Current or Ex-Partner: No    Emotionally Abused: No     Physically Abused: No    Sexually Abused: No     Review of Systems    General:  No chills, fever, night sweats or weight changes.  Cardiovascular:  No chest pain, dyspnea on exertion, edema, orthopnea, palpitations, paroxysmal nocturnal dyspnea. Dermatological: No rash, lesions/masses Respiratory: No cough, dyspnea Urologic: No hematuria, dysuria Abdominal:   No nausea, vomiting, diarrhea, bright red blood per rectum, melena, or hematemesis Neurologic:  No visual changes, wkns, changes in mental status. All other systems reviewed and are otherwise negative except as noted above.  Physical Exam    VS:  LMP  (LMP Unknown)  , BMI There is no height or weight on file to calculate BMI. GEN: Well nourished, well developed, in no acute distress. HEENT: normal. Neck: Supple, no JVD, carotid bruits, or masses. Cardiac: RRR, no murmurs, rubs, or gallops. No clubbing, cyanosis, edema.  Radials/DP/PT 2+ and equal bilaterally.  Respiratory:  Respirations regular and unlabored, clear to auscultation bilaterally. GI: Soft, nontender, nondistended, BS + x 4. MS: no deformity or atrophy. Skin: warm and dry, no rash. Neuro:  Strength and sensation are intact. Psych: Normal affect.  Accessory Clinical Findings    Recent Labs: 06/21/2023: TSH 1.320 07/06/2023: ALT 17; BUN 13; Creatinine, Ser 0.73; Hemoglobin 13.9; Platelets 215; Potassium 4.2; Sodium 139 08/17/2023: BNP 474.0   Recent Lipid Panel    Component Value Date/Time   CHOL 121 07/06/2023 1149   TRIG 68 07/06/2023 1149   HDL 52 07/06/2023 1149   CHOLHDL 2.3 07/06/2023 1149  CHOLHDL 3.5 04/12/2023 0727   VLDL 12 04/12/2023 0727   LDLCALC 55 07/06/2023 1149    No BP recorded.  {Refresh Note OR Click here to enter BP  :1}***    ECG personally reviewed by me today- None today.  TTE 04/12/2023  IMPRESSIONS     1. Left ventricular ejection fraction, by estimation, is 30 to 35%. Left  ventricular ejection fraction by 3D volume  is 33 %. The left ventricle has  moderately decreased function. The left ventricle demonstrates global  hypokinesis. The left ventricular  internal cavity size was severely dilated. There is mild concentric left  ventricular hypertrophy. Left ventricular diastolic parameters are  consistent with Grade III diastolic dysfunction (restrictive). Elevated  left ventricular end-diastolic pressure.   2. Right ventricular systolic function is normal. The right ventricular  size is normal. Tricuspid regurgitation signal is inadequate for assessing  PA pressure.   3. Left atrial size was severely dilated.   4. The mitral valve is normal in structure. Moderate mitral valve  regurgitation. No evidence of mitral stenosis.   5. The aortic valve is tricuspid. Aortic valve regurgitation is not  visualized. Aortic valve sclerosis/calcification is present, without any  evidence of aortic stenosis. Aortic valve area, by VTI measures 2.32 cm.  Aortic valve mean gradient measures  4.0 mmHg. Aortic valve Vmax measures 1.33 m/s.   6. Aortic dilatation noted. There is mild dilatation of the ascending  aorta, measuring 38 mm.   7. The inferior vena cava is normal in size with greater than 50%  respiratory variability, suggesting right atrial pressure of 3 mmHg.   8. Compared to prior echo, LVF has declined.   Comparison(s): EF 60-65%.   FINDINGS   Left Ventricle: Left ventricular ejection fraction, by estimation, is 30  to 35%. Left ventricular ejection fraction by 3D volume is 33 %. The left  ventricle has moderately decreased function. The left ventricle  demonstrates global hypokinesis. The left  ventricular internal cavity size was severely dilated. There is mild  concentric left ventricular hypertrophy. Left ventricular diastolic  parameters are consistent with Grade III diastolic dysfunction  (restrictive). Elevated left ventricular end-diastolic   pressure.   Right Ventricle: The right  ventricular size is normal. No increase in  right ventricular wall thickness. Right ventricular systolic function is  normal. Tricuspid regurgitation signal is inadequate for assessing PA  pressure.   Left Atrium: Left atrial size was severely dilated.   Right Atrium: Right atrial size was normal in size.   Pericardium: There is no evidence of pericardial effusion.   Mitral Valve: The mitral valve is normal in structure. Moderate mitral  valve regurgitation. No evidence of mitral valve stenosis.   Tricuspid Valve: The tricuspid valve is normal in structure. Tricuspid  valve regurgitation is trivial. No evidence of tricuspid stenosis.   Aortic Valve: The aortic valve is tricuspid. Aortic valve regurgitation is  not visualized. Aortic valve sclerosis/calcification is present, without  any evidence of aortic stenosis. Aortic valve mean gradient measures 4.0  mmHg. Aortic valve peak gradient  measures 7.1 mmHg. Aortic valve area, by VTI measures 2.32 cm.   Pulmonic Valve: The pulmonic valve was normal in structure. Pulmonic valve  regurgitation is mild. No evidence of pulmonic stenosis.   Aorta: Aortic dilatation noted. There is mild dilatation of the ascending  aorta, measuring 38 mm.   Venous: The inferior vena cava is normal in size with greater than 50%  respiratory variability, suggesting right atrial pressure of 3  mmHg.   IAS/Shunts: No atrial level shunt detected by color flow Doppler.       Assessment & Plan   1.  Acute on chronic combined systolic and diastolic CHF,Nonischemic dilated cardiomyopathy -weight today***.  Working on increasing physical activity.  Improved with increased diuresis.  Limited echo 05/31/2023 showed LVEF of 34% and G1 DD.   Continue metoprolol, potassium, Entresto, spironolactone, torsemide, Jardiance Heart healthy low-sodium diet Continue to increase physical activity Order TEE  Essential hypertension-BP today 12***2/80 . Maintain blood  pressure log Continue spironolactone, metoprolol, Entresto   History of CVA-bubble study was negative. Continue aspirin, atorvastatin Following with neurology Continue to progress with physical activity.  Disposition: Follow-up with Dr.Thukkani or me in 3-4 months.   Thomasene Ripple. Roselani Grajeda NP-C     08/29/2023, 12:31 PM [facility] [address] Office [phone] Fax [phone]    I spent 14***minutes examining this patient, reviewing medications, and using patient centered shared decision making involving her cardiac care.   I spent greater than 20 minutes reviewing her past medical history,  medications, and prior cardiac tests.

## 2023-08-30 ENCOUNTER — Ambulatory Visit: Attending: General Practice | Admitting: General Practice

## 2023-08-31 ENCOUNTER — Other Ambulatory Visit: Payer: Self-pay

## 2023-09-01 ENCOUNTER — Ambulatory Visit: Payer: Self-pay | Admitting: Physical Therapy

## 2023-09-01 ENCOUNTER — Telehealth (HOSPITAL_COMMUNITY): Payer: Self-pay

## 2023-09-01 ENCOUNTER — Ambulatory Visit: Payer: Medicaid Other | Admitting: Speech Pathology

## 2023-09-01 ENCOUNTER — Encounter: Payer: Self-pay | Admitting: Physical Therapy

## 2023-09-01 VITALS — BP 150/73 | HR 71

## 2023-09-01 DIAGNOSIS — R4701 Aphasia: Secondary | ICD-10-CM

## 2023-09-01 DIAGNOSIS — M6281 Muscle weakness (generalized): Secondary | ICD-10-CM

## 2023-09-01 DIAGNOSIS — R269 Unspecified abnormalities of gait and mobility: Secondary | ICD-10-CM

## 2023-09-01 DIAGNOSIS — R296 Repeated falls: Secondary | ICD-10-CM

## 2023-09-01 NOTE — Therapy (Unsigned)
 OUTPATIENT SPEECH LANGUAGE PATHOLOGY APHASIA TREATMENT   Patient Name: Chelsea Branch MRN: [id number] DOB:1965-08-11, 58 y.o., female Today's Date: 09/01/2023  PCP: [name], MD REFERRING PROVIDER: Micki Riley, MD  END OF SESSION:      Past Medical History:  Diagnosis Date   Abnormal thyroid blood test 03/15/2017   Abnormal uterine bleeding    Alopecia    Anemia    Angina pectoris with normal coronary arteriogram (HCC) 2017   Had + Troponin c/w ? 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 iniatially ?2/2 peripartum in 1994 - improved by 2008 then worsening EF in 2011 back down to EF 25-30%. b. echo 01/21/14 showed mod LVH, [name]; 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   Peripartum cardiomyopathy 1994   Sleep apnea 2015   CPAP 12/2013   Stroke (HCC) 2011   Systolic and diastolic CHF, acute on chronic (HCC) 06/14/2015   Tobacco abuse    Past Surgical History:  Procedure Laterality Date   CARDIAC CATHETERIZATION N/A 06/16/2015   Procedure: Left Heart Cath and Coronary Angiography;  Surgeon: Corky Crafts, MD;  Location: [facility] INVASIVE CV LAB;  Service: Cardiovascular;  Laterality: N/A;   CESAREAN SECTION  1992  1994   INTRAUTERINE DEVICE (IUD) INSERTION     about 3 years ago   LOOP RECORDER IMPLANT  ~ 2000   spine injections      [facility],  Dr Lorrine Kin   TIBIAL TUBERCLERPLASTY     TRANSESOPHAGEAL ECHOCARDIOGRAM (CATH LAB) N/A 04/20/2023   Procedure: TRANSESOPHAGEAL  ECHOCARDIOGRAM;  Surgeon: [name], MD;  Location: [facility] INVASIVE CV LAB;  Service: Cardiovascular;  Laterality: N/A;   TUBAL LIGATION  05/24/1992   Patient Active Problem List   Diagnosis Date Noted   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   History of stroke with residual effects 07/06/2023   Frequent falls 07/06/2023   Prediabetes 04/28/2023   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) 04/28/2023   History of cerebellar stroke 04/13/2023   History of stroke 04/11/2023   Liletta IUD (intrauterine device) in place since 12/15/2015 04/06/2023   Chronic pain syndrome 12/28/2021   Primary osteoarthritis of both knees 12/28/2021   Spinal stenosis of lumbar region 07/21/2021   Lumbar radiculitis 07/21/2021   COPD (chronic obstructive pulmonary disease) (HCC) 07/21/2021   Mechanical complication due to intrauterine contraceptive device 07/21/2021   Arthropathy of lumbar facet joint 03/14/2019   Degenerative disc disease at L5-S1 level 10/10/2018   Hyperthyroidism 04/26/2016   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   History of non-ST elevation myocardial infarction (NSTEMI) 06/14/2015   Homelessness 09/16/2014   Vitamin D deficiency 01/29/2014   OSA (obstructive sleep apnea) 01/08/2014   Chronic combined systolic and diastolic CHF (congestive heart failure) (HCC) 08/14/2013   Essential hypertension    Fibroids 08/09/2012   Menorrhagia 05/08/2012   Female pattern hair loss    HLD (hyperlipidemia) 06/08/2010   CEREBRAL ANEURYSM 06/08/2010   History of  cardiovascular disorder 06/08/2010    ONSET DATE: 04/13/2023 (referral date)   REFERRING DIAG: I63.9 (ICD-10-CM) - Cerebellar stroke, acute (HCC) I63.9 (ICD-10-CM) - Acute cerebral infarction  THERAPY DIAG:  No diagnosis found.  Rationale for Evaluation and Treatment: Rehabilitation  SUBJECTIVE:   SUBJECTIVE STATEMENT: *** Pt accompanied by: self  PERTINENT HISTORY: "Chelsea Branch is a 58 yo  female presenting to ED 11/18 with expressive aphasia, R UE weakness, and confusion. MRI Brain with acute infarcts in the acute occipital lobe, posterior aspect of L insula, and L cerebellar hemisphere. TNK administered. PMH includes prior CVA (2011), OSA, tobacco use, chronic combined CHF, HTN, NSTEMI (2017), NIDCM"   PAIN:  Are you having pain? Yes: Pain location: Legs Pain description: ache  FALLS: Has patient fallen in last 6 months?  Yes, Number of falls: 2 , SLP requesting PT referral for c/o falls and LE weakness/pain, balance issues   LIVING ENVIRONMENT: Lives with: lives with their family, reporting housing instability ("I bounce around")  PLOF:  Level of assistance: Independent with ADLs, Independent with IADLs Employment: Full-time employment - has not returned to work  PATIENT GOALS: be able to speak to friends and family   OBJECTIVE:  Note: Objective measures were completed at Evaluation unless otherwise noted.                                                                                                                TREATMENT DATE:  09/01/23:   08/25/23: SLP initiated VNEST to target anomia and assess patient's written language. SLP began session with review of dysarthria strategies to support improved clarity of speech and increased vocal intensity in today's session. Pt's speech occasionally dropped off with pt benefiting from occasional cues. Pt participated effectively completing 3 trials of Verb Network Strengthening Treatment (VNeST) activity given fading cues and intermittent verbal and visual cues for error 'd spelling and written semantic errors. Pt able to correct with mod-A in 6/6 opportunities during today's session. SLP provided handout to support continued practice with writing and anomia strategies.   08/18/23: SLP initiated semantic feature analysis (SFA) to continue targeting anomia and provide further education on anomia strategies. Pt participated effectively  with intermittent anomia. Pt implementing strategies with occasional model and mod-A. Potential cog deficits suspected d/t inconsistent performance following directions throughout tasks. SLP reintroduced dysarthria strategies to support increased intelligibility and clear speech. SLP led pt through picture description task encouraging carryover of anomia strategies and clear intelligible speech. Pt participated effectively however minimal anomia noted during this task. Pt required usual mod cues to implement dysarthria strategies, and clinician model to aid understanding. Handout provided for carryover.  08/11/23: Target expressive language via naming in personally a relevant categories. Pt names ~10 music artists and ~9 preferred meals. Pt demonstrates usual anomia with more abstract targets. Improved fluency noted for concrete categories. In task describing favorite meals to cook, pt able to explain ingredients needed, favorite foods, steps to prepare, etc. SLP provided education for use of anomia strategies as anomia occurred. Pt  benefiting from questioning cues for description. Was facilitative for informing SLP of target but not in word retrieval. Semantic/phonemic/letter cues ineffective. SLP provided pt education regarding dysarthria strategies with pt returning demonstration and verbalizing understanding. SLP initiated conversational language task instructing pt to think about her speech and implement dysarthria strategies. Pt participated with mod-A and usual cues from SLP to maintain clear, strong, intelligible voice.   07/05/23: Completed eval and POC.  PATIENT EDUCATION: Education details: Tour manager Person educated: Patient Education method: Medical illustrator Education comprehension: verbalized understanding and needs further education   GOALS: Goals reviewed with patient? Yes  SHORT TERM GOALS: Target date: 07/26/23 (date changed d/t scheduling)   Pt will use verbal  compensations for word finding in structured task with rare min A  Baseline: no compensations for word finding  Goal status: NOT MET  2.  Pt will name 15 items in personally relevant category with occasional min A  Baseline: mild aphasia  Goal status: NOT MET  3.  Pt will demonstrate auditory processing strategies during structured practice Baseline: 80% accuracy Y/N questions, reports challenges understanding friends/family in conversation Goal status: NOT MET   4.  Pt will complete moderately complex structured language task (sentence level) with 90% accuracy to aid in language rehabilitation Baseline: mild aphasia  Goal status: NOT MET   LONG TERM GOALS: Target date: 09/27/2023  Pt will use compensations for word finding in conversation 4/5 opportunities with rare min A Baseline: no compensations Goal status: IN PROGRESS   2.  Pt will utilize processing compensations during 15 minute conversation in 80% of opportunities, evidenced by appropriate responses/comments Baseline: no compensations Goal status: IN PROGRESS   3.   Pt will complete moderately complex structured language task (discourse level) with 90% accuracy to aid in language rehabilitation Baseline: mild aphasia  Goal status: IN PROGRESS   4.  Pt will verbalize x4 HEP activities for language Baseline: to be established  Goal status: IN PROGRESS   ASSESSMENT:  CLINICAL IMPRESSION: Pt seen for skilled ST targeting expressive aphasia. Pt demonstrates improved word finding for concrete targets, with anomia present for more abstract naming task. Continued training for word finding strategies and pt education of dysarthria strategies. Pt will continue to benefit from ST following current POC.   OBJECTIVE IMPAIRMENTS: include mild aphasia, expressive and receptive and dysarthria. These impairments are limiting patient from effectively communicating at home and in community. Factors affecting potential to achieve goals  and functional outcome are financial resources. Patient will benefit from skilled SLP services to address above impairments and improve overall function.  REHAB POTENTIAL: Good  PLAN:  SLP FREQUENCY: 1x/week  SLP DURATION: 12 weeks  PLANNED INTERVENTIONS: Language facilitation, Cueing hierachy, Internal/external aids, Functional tasks, Multimodal communication approach, SLP instruction and feedback, Compensatory strategies, and Patient/family education  Check all possible CPT codes: 16109 - SLP treatment    Check all conditions that are expected to impact treatment: Social determinants of health   If treatment provided at initial evaluation, no treatment charged due to lack of authorization.   Graduate clinician Roylene Reason present for this session and participated in administration of therapeutic interventions under my supervision.   [employer], Student-SLP 09/01/2023, 7:53 AM

## 2023-09-01 NOTE — Therapy (Signed)
 OUTPATIENT PHYSICAL THERAPY NEURO TREATMENT / DISCHARGE   Patient Name: Chelsea Branch MRN: [id number] DOB:1966/05/03, 58 y.o., female Today's Date: 09/01/2023   PCP: [name], MD REFERRING PROVIDER: Storm Frisk, MD  PHYSICAL THERAPY DISCHARGE SUMMARY  Visits from Start of Care: 5  Current functional level related to goals / functional outcomes: Achieved 4/5 LTG   Remaining deficits: Falls risk and decreased cardiac response exercise  Education / Equipment: Rollator use   Patient agrees to discharge. Patient goals were partially met. Patient is being discharged due to maximized rehab potential.    END OF SESSION:  PT End of Session - 09/01/23 1408     Visit Number 5    Number of Visits 5    Date for PT Re-Evaluation 09/08/23    Authorization Type [place] Medicaid    PT Start Time 1406   shortened due to late arrival   PT Stop Time 1442    PT Time Calculation (min) 36 min    Equipment Utilized During Treatment Gait belt    Activity Tolerance Treatment limited secondary to medical complications (Comment)    Behavior During Therapy Flat affect             Past Medical History:  Diagnosis Date   Abnormal thyroid blood test 03/15/2017   Abnormal uterine bleeding    Alopecia    Anemia    Angina pectoris with normal coronary arteriogram (HCC) 2017   Had + Troponin c/w ? 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 iniatially ?2/2 peripartum in 1994 - improved by 2008 then worsening EF in 2011 back down to EF 25-30%. b. echo 01/21/14 showed mod LVH, [name]; [name], [name], [name],    Nonischemic  dilated cardiomyopathy (HCC) 06/17/2015   NSTEMI (non-ST elevated myocardial infarction) (HCC) 05/2015   Normal Coronaries.   Peripartum cardiomyopathy 1994   Sleep apnea 2015   CPAP 12/2013   Stroke (HCC) 2011   Systolic and diastolic CHF, acute on chronic (HCC) 06/14/2015   Tobacco abuse    Past Surgical History:  Procedure Laterality Date   CARDIAC CATHETERIZATION N/A 06/16/2015   Procedure: Left Heart Cath and Coronary Angiography;  Surgeon: Corky Crafts, MD;  Location: [facility] INVASIVE CV LAB;  Service: Cardiovascular;  Laterality: N/A;   CESAREAN SECTION  1992  1994   INTRAUTERINE DEVICE (IUD) INSERTION     about 3 years ago   LOOP RECORDER IMPLANT  ~ 2000   spine injections      [facility],  Dr Lorrine Kin   TIBIAL TUBERCLERPLASTY     TRANSESOPHAGEAL ECHOCARDIOGRAM (CATH LAB) N/A 04/20/2023   Procedure: TRANSESOPHAGEAL ECHOCARDIOGRAM;  Surgeon: [name], MD;  Location: [facility] INVASIVE CV LAB;  Service: Cardiovascular;  Laterality: N/A;   TUBAL LIGATION  05/24/1992   Patient Active Problem List   Diagnosis Date Noted   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   History of stroke with residual effects 07/06/2023   Frequent falls 07/06/2023   Prediabetes 04/28/2023   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) 04/28/2023   History of  cerebellar stroke 04/13/2023   History of stroke 04/11/2023   Liletta IUD (intrauterine device) in place since 12/15/2015 04/06/2023   Chronic pain syndrome 12/28/2021   Primary osteoarthritis of both knees 12/28/2021   Spinal stenosis of lumbar region 07/21/2021   Lumbar radiculitis 07/21/2021   COPD (chronic obstructive pulmonary disease) (HCC) 07/21/2021   Mechanical complication due to intrauterine contraceptive device 07/21/2021   Arthropathy of lumbar facet joint 03/14/2019   Degenerative disc disease at L5-S1 level 10/10/2018   Hyperthyroidism 04/26/2016   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    History of non-ST elevation myocardial infarction (NSTEMI) 06/14/2015   Homelessness 09/16/2014   Vitamin D deficiency 01/29/2014   OSA (obstructive sleep apnea) 01/08/2014   Chronic combined systolic and diastolic CHF (congestive heart failure) (HCC) 08/14/2013   Essential hypertension    Fibroids 08/09/2012   Menorrhagia 05/08/2012   Female pattern hair loss    HLD (hyperlipidemia) 06/08/2010   CEREBRAL ANEURYSM 06/08/2010   History of cardiovascular disorder 06/08/2010    ONSET DATE: 07/06/2023 (referral date)  REFERRING DIAG:  Z86.73 (ICD-10-CM) - H/O stroke without residual deficits R29.6 (ICD-10-CM) - Frequent falls I63.9 (ICD-10-CM) - Cerebellar stroke, acute (HCC)  THERAPY DIAG:  Muscle weakness (generalized)  Abnormality of gait and mobility  Repeated falls  Rationale for Evaluation and Treatment: Rehabilitation  SUBJECTIVE:                                                                                                                                                                                             SUBJECTIVE STATEMENT: Patient reports that she is feeling much better today. She denies falls and near falls. Reports that she is working just a little bit on her homework at home.   Pt accompanied by: self  PERTINENT HISTORY: MI, anemia, DM, asthma, sickle cell anemia, stroke, CHF, hypertension, hyperlipidemia   PAIN:  Are you having pain? No  PRECAUTIONS: Fall  RED FLAGS: None   WEIGHT BEARING RESTRICTIONS: No  FALLS: Has patient fallen in last 6 months? No  LIVING ENVIRONMENT: Lives with: lives with their partner - with boyfriend  Lives in: House/apartment Stairs:  ramped entry  Has following equipment at home: Environmental consultant - 4 wheeled  PLOF: Independent  PATIENT GOALS: "Work on keeping my legs from going out and prevent falls."   OBJECTIVE:  Note: Objective measures were completed at Evaluation unless otherwise noted.  DIAGNOSTIC FINDINGS:  04/12/23 Brain MRI  IMPRESSION: Acute infarcts in the right occipital lobe, along the posterior aspect of the left insula, and in the left cerebellar hemisphere with additional tiny foci of possible acute infarction in  the left parietal lobe. Findings are suspicious for cardioembolic etiology. Trace petechial hemorrhage in the medial aspect of the right occipital lobe and posterior left insula. No significant mass effect.  COGNITION: Overall cognitive status: Impaired does not recall being here previously for physical therapy until prompted    SENSATION: WFL  COORDINATION: WFL   TODAY'S TREATMENT:                                                                                                                               Self Care: Vitals:   09/01/23 1413  BP: (!) 150/73  Pulse: 71  Assessed on LUE as note above Recommend patient returning for PT ~3 months for check on progress, recommend continued work on walking and HEP at home, recommend full time use of rollator, discussed how cardiac function is limitor and educated on energy conservation  TherAct: Activity Specific Balance Scale: 48.8% Confidence   [facility] PT Assessment - 09/01/23 0001       Standardized Balance Assessment   Standardized Balance Assessment Five Times Sit to Stand    Five times sit to stand comments  26.27   seconds without UE support (SBA)   10 Meter Walk 0.54   m/s with rollator (SBA)            PATIENT EDUCATION: Education details: Continue HEP + Self Care  Person educated: Patient Education method: Explanation Education comprehension: verbalized understanding and needs further education  HEP:  Access Code: [email or web address] URL: [email or web address] Date: 08/18/2023 Prepared by: Maryruth Eve  Exercises - Sit to Stand Without Arm Support  - 1 x daily - 7 x weekly - 3 sets - 5 reps - Standing March with Counter Support  - 1 x daily - 7 x weekly - 2 sets - 20 reps - Standing Knee  Flexion with Counter Support  - 1 x daily - 7 x weekly - 2 sets - 10 reps - Standing Hip Abduction with Unilateral Counter Support  - 1 x daily - 7 x weekly - 2 sets - 15 reps  GOALS:  LONG TERM GOALS:  Target date: 09/29/2023 (STG = LTG due to POC length)  Patient will report demonstrate independence with final HEP in order to maintain current gains and continue to progress after physical therapy discharge.   Baseline: To be provided, reports confidence in HEP Goal status: MET  2.  Patient will improve their 5x Sit to Stand score to less than 28 seconds to demonstrate a decreased risk for falls and improved LE strength.   Baseline: 34.81 seconds without UE use, improved to 26.27 seconds without UE use  Goal status: MET  3.  Patient will improve gait speed to 0.6 m/s with LRAD to indicate improvement towards the level of community ambulator in order to participate more easily in activities outside of the home.   Baseline: 0.5 m/s with rollator (SBA); 0.54 m/s with rollator  Goal status: NOT MET  4.  Patient will improve ABC Score to 20% or greater to indicate a decreased risk for falls and improved self-reported confidence in balance and sense of steadiness.   Baseline: 1.9% Confidence; improved to 48.8% confidence  Goal status: MET   ASSESSMENT:  CLINICAL IMPRESSION: Patient discharging from skilled physical therapy given maximized rehab potential with current cardiac function. PT recommend return to PT ~3 months with new referral following further cardiac workup to assess progress and ensure no further decline. Patient ongoing falls risk and as indicated by gait speed and FxSTS. Recommend full time use of rollator at this time. Patient independent with final HEP.   CLINICAL DECISION MAKING: Evolving/moderate complexity  EVALUATION COMPLEXITY: Moderate due to HF  PLAN:  PT FREQUENCY: 2x/week  PT DURATION: 2 weeks  NEXT: NA - D/C with updated HEP  Carmelia Bake, PT,  DPT  09/01/2023, 4:07 PM   For all possible CPT codes, reference the Planned Interventions line above.     Check all conditions that are expected to impact treatment: {Conditions expected to impact treatment:Respiratory disorders and Neurological condition and/or seizures   If treatment provided at initial evaluation, no treatment charged due to lack of authorization.

## 2023-09-01 NOTE — Telephone Encounter (Signed)
 Patient called to check on status of results. Patient informed and voiced understanding.

## 2023-09-01 NOTE — Patient Instructions (Signed)
 Where do you find it?  What color is it?  What does it feel like?  What is it used for?

## 2023-09-01 NOTE — Progress Notes (Signed)
 Your cervicovaginal swab was negative for gonorrhea, chlamydia, trichomonas, bacterial vaginosis, yeast.  Your blood work was negative for HIV and syphilis.

## 2023-09-02 ENCOUNTER — Other Ambulatory Visit: Payer: Self-pay

## 2023-09-04 ENCOUNTER — Other Ambulatory Visit: Payer: Self-pay | Admitting: Critical Care Medicine

## 2023-09-04 DIAGNOSIS — L658 Other specified nonscarring hair loss: Secondary | ICD-10-CM

## 2023-09-05 ENCOUNTER — Telehealth: Payer: Self-pay | Admitting: Internal Medicine

## 2023-09-05 NOTE — Telephone Encounter (Signed)
 Called and made patient aware to take Jardiance 10 mg before breakfast as ordered by provider. Appt scheduled with provider. Made patient aware to call office for any other questions or if she needs to reschedule appointment. Patient verbalized an understanding.

## 2023-09-05 NOTE — Telephone Encounter (Signed)
 Patient would like a call back to discuss a new prescription. She doesn't know the name of it. May be Jardiance. She would like to know how to take it.

## 2023-09-13 ENCOUNTER — Other Ambulatory Visit: Payer: Self-pay

## 2023-09-13 ENCOUNTER — Other Ambulatory Visit: Payer: Self-pay | Admitting: Family Medicine

## 2023-09-14 ENCOUNTER — Other Ambulatory Visit: Payer: Self-pay

## 2023-09-14 MED ORDER — METHIMAZOLE 5 MG PO TABS
5.0000 mg | ORAL_TABLET | Freq: Two times a day (BID) | ORAL | 1 refills | Status: DC
  Filled 2023-09-14: qty 180, 90d supply, fill #0
  Filled 2023-12-21 (×3): qty 180, 90d supply, fill #1

## 2023-09-15 ENCOUNTER — Telehealth: Payer: Self-pay | Admitting: Family Medicine

## 2023-09-15 ENCOUNTER — Other Ambulatory Visit: Payer: Self-pay

## 2023-09-15 NOTE — Telephone Encounter (Signed)
 Patient has been called and informed of thyroid  medication being refilled.

## 2023-09-15 NOTE — Telephone Encounter (Signed)
 Copied from CRM [id number]. Topic: Clinical - Prescription Issue  >> Sep 15, 2023  1:03 PM Crispin Dolphin wrote:  Reason for CRM: Patient called. Request a callback from nurse. Would like to know why one of her medications was discontinued. Unsure of medication name. She thinks it starts with Met. Thank You

## 2023-09-15 NOTE — Telephone Encounter (Signed)
 Duplicate message.

## 2023-09-15 NOTE — Telephone Encounter (Signed)
 Copied from CRM [id number]. Topic: Clinical - Prescription Issue >> Sep 15, 2023  1:03 PM Crispin Dolphin wrote: Reason for CRM: Patient called. Request a callback from nurse. Would like to know why one of her medications was discontinued. Unsure of medication name. She thinks it starts with Met. Thank You >> Sep 15, 2023  2:01 PM Felizardo Hotter wrote: Pt call wants to know why her thyroid  medication was discontinued. Pt does not know name of medication. Please call pt at [phone]

## 2023-09-19 ENCOUNTER — Other Ambulatory Visit: Payer: Self-pay

## 2023-09-20 ENCOUNTER — Ambulatory Visit: Admitting: Physician Assistant

## 2023-09-27 ENCOUNTER — Other Ambulatory Visit: Payer: Self-pay

## 2023-09-30 ENCOUNTER — Ambulatory Visit: Payer: Self-pay

## 2023-09-30 ENCOUNTER — Other Ambulatory Visit: Payer: Self-pay

## 2023-09-30 NOTE — Telephone Encounter (Signed)
 Copied from CRM [id number]. Topic: Clinical - Red Word Triage >> Sep 30, 2023  4:39 PM Rennis Case wrote: Red Word that prompted transfer to Nurse Triage: Patient unable to get medications from pharmacy due to cost, patient asked pharmacy for samples of tylenol  but was advised they do no provide samples of tylenol .   Patient requesting a prescription for tylenol  to be sent in or assistance w/ cost of tylenol .  Patient having pain in legs, rating pain an 8.   Chief Complaint: Leg pain Symptoms: Bilateral leg pain Disposition: [] ED /[] Urgent Care (no appt availability in office) / [] Appointment(In office/virtual)/ []  [place] [facility]/ [] Home Care/ [] Refused Recommended Disposition /[] [place] [facility]/ [x]  Follow-up with PCP Additional Notes: Patient states that she has been having bilateral leg pain from her knees down for about a month. She states her pain has been worsening and rates it as 8/10. She denies any other symptoms. Patient states she tried to pick up Tylenol  and was unable to due to the cost and wanted to know if there was anything that could be done to help. CAL called who will forward the request to Dr. Newlin. Patient has been advised of this and will wait for a response.     Reason for Disposition  [1] MODERATE pain (e.g., interferes with normal activities, limping) AND [2] present > 3 days  Answer Assessment - Initial Assessment Questions 1. ONSET: "When did the pain start?"      1 month  2. LOCATION: "Where is the pain located?"      From her knees down her legs  3. PAIN: "How bad is the pain?"    (Scale 1-10; or mild, moderate, severe)   -  MILD (1-3): doesn't interfere with normal activities    -  MODERATE (4-7): interferes with normal activities (e.g., work or school) or awakens from sleep, limping    -  SEVERE (8-10): excruciating pain, unable to do any normal activities, unable to walk     8/10 4. WORK OR EXERCISE: "Has there been any recent work or exercise  that involved this part of the body?"      No 5. CAUSE: "What do you think is causing the leg pain?"     Unsure  6. OTHER SYMPTOMS: "Do you have any other symptoms?" (e.g., chest pain, back pain, breathing difficulty, swelling, rash, fever, numbness, weakness)     No  Protocols used: Leg Pain-A-AH

## 2023-10-03 NOTE — Telephone Encounter (Signed)
 Patient on walkin schedule for 10/05/2022

## 2023-10-05 ENCOUNTER — Encounter: Payer: Self-pay | Admitting: Nurse Practitioner

## 2023-10-05 ENCOUNTER — Ambulatory Visit: Attending: Family Medicine | Admitting: Nurse Practitioner

## 2023-10-05 ENCOUNTER — Other Ambulatory Visit: Payer: Self-pay

## 2023-10-05 VITALS — BP 125/74 | HR 67 | Resp 19 | Ht 68.0 in | Wt 201.6 lb

## 2023-10-05 DIAGNOSIS — R7303 Prediabetes: Secondary | ICD-10-CM | POA: Diagnosis not present

## 2023-10-05 DIAGNOSIS — M25562 Pain in left knee: Secondary | ICD-10-CM

## 2023-10-05 DIAGNOSIS — M25561 Pain in right knee: Secondary | ICD-10-CM | POA: Diagnosis not present

## 2023-10-05 DIAGNOSIS — H60333 Swimmer's ear, bilateral: Secondary | ICD-10-CM | POA: Diagnosis not present

## 2023-10-05 MED ORDER — PREDNISONE 20 MG PO TABS
20.0000 mg | ORAL_TABLET | Freq: Every day | ORAL | 0 refills | Status: AC
  Filled 2023-10-05: qty 5, 5d supply, fill #0

## 2023-10-05 MED ORDER — CIPROFLOXACIN-DEXAMETHASONE 0.3-0.1 % OT SUSP
4.0000 [drp] | Freq: Two times a day (BID) | OTIC | 0 refills | Status: DC
  Filled 2023-10-05: qty 7.5, 9d supply, fill #0

## 2023-10-05 NOTE — Patient Instructions (Addendum)
 [facility] [address]  [phone]

## 2023-10-05 NOTE — Progress Notes (Signed)
 Assessment & Plan:  Chelsea Branch was seen today for knee pain.  Diagnoses and all orders for this visit:  Bilateral anterior knee pain She will need to have xrays completed Continue physical therapy -     predniSONE  (DELTASONE ) 20 MG tablet; Take 1 tablet (20 mg total) by mouth daily with breakfast for 5 days. -     Uric Acid  Swimmer's ear of both sides, unspecified chronicity -     ciprofloxacin -dexamethasone  (CIPRODEX ) OTIC suspension; Place 4 drops into both ears 2 (two) times daily.  Prediabetes -     Hemoglobin A1c    Patient has been counseled on age-appropriate routine health concerns for screening and prevention. These are reviewed and up-to-date. Referrals have been placed accordingly. Immunizations are up-to-date or declined.    Subjective:   Chief Complaint  Patient presents with   Knee Pain    Chelsea Branch 58 y.o. female presents to office today with complaints of bilateral knee pain and swelling which has been ongoing for a few months now.  She is a patient of Dr. Newlin and I saw her for the same symptoms 2 months ago.   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       Knee Pain: Patient presents for follow up on a knee problem involving the  bilateral knees. Onset of the symptoms was several months ago. Inciting event: frequent falls.  Current symptoms include stiffness, swelling, and pain. Pain is aggravated by any weight bearing, rising after sitting, standing, and walking.  Patient has had no prior knee problems. Evaluation to date: none. Treatment to date: OTC analgesics which are somewhat effective. Was taking tylenol  but can not afford anymore so she is not currently taking any medication for knee pain. At her last visit with me 2 months ago I ordered bilateral knee x-rays.  She has failed to follow-up for these to be completed as of today.      She was recently prescribed Jardiance  by cardiology and questioning if she should take this or not.  She also believes this has caused a rash on her left forearm.  I have instructed her to follow-up with cardiology for any questions regarding her Jardiance .     States both ears have been draining malodorous clear drainage at night over the past few weeks.    Review of Systems  Constitutional:  Negative for fever, malaise/fatigue and weight loss.  HENT:  Positive for ear discharge and ear pain. Negative for nosebleeds.   Eyes: Negative.  Negative for blurred vision, double vision and photophobia.  Respiratory: Negative.  Negative for cough and shortness of breath.   Cardiovascular: Negative.  Negative for chest pain, palpitations and leg swelling.  Gastrointestinal: Negative.  Negative for heartburn, nausea and vomiting.  Musculoskeletal:  Positive for joint pain. Negative for myalgias.  Neurological:  Positive for weakness. Negative for dizziness, focal weakness, seizures and headaches.  Psychiatric/Behavioral: Negative.  Negative for suicidal ideas.     Past Medical History:  Diagnosis Date   Abnormal thyroid  blood test 03/15/2017   Abnormal  uterine bleeding    Alopecia    Anemia    Angina pectoris with normal coronary arteriogram (HCC) 2017   Had + Troponin c/w ? NSTEMI due to A on C CHF   ARNOLD-CHIARI MALFORMATION 06/08/2010   Chronic combined systolic and  diastolic CHF (congestive heart failure) (HCC)    DYSLIPIDEMIA    Essential hypertension    Fibroids    H/O noncompliance with medical treatment, presenting hazards to health    Hypertension    Hypokalemia 07/23/2016   Menorrhagia    Nonischemic cardiomyopathy (HCC) 1994; 2017   a. iniatially ?2/2 peripartum in 1994 - improved by 2008 then worsening EF in 2011 back down to EF 25-30%. b. echo 01/21/14 showed mod LVH, [name]; 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   Peripartum cardiomyopathy 1994   Sleep apnea 2015   CPAP 12/2013   Stroke (HCC) 2011   Systolic and diastolic CHF, acute on chronic (HCC) 06/14/2015   Tobacco abuse     Past Surgical History:  Procedure Laterality Date   CARDIAC CATHETERIZATION N/A 06/16/2015   Procedure: Left Heart Cath and Coronary Angiography;  Surgeon: Lucendia Rusk, MD;  Location: [facility] INVASIVE CV LAB;  Service: Cardiovascular;  Laterality: N/A;   CESAREAN SECTION  1992  1994   INTRAUTERINE DEVICE (IUD) INSERTION     about 3 years ago   LOOP RECORDER IMPLANT  ~ 2000   spine injections      [facility],  Dr Crecencio Dodge   TIBIAL TUBERCLERPLASTY     TRANSESOPHAGEAL ECHOCARDIOGRAM (CATH LAB) N/A 04/20/2023   Procedure: TRANSESOPHAGEAL ECHOCARDIOGRAM;  Surgeon: Bridgette Campus, MD;  Location: [facility] INVASIVE CV LAB;  Service: Cardiovascular;  Laterality: N/A;   TUBAL LIGATION  05/24/1992    Family History  Problem Relation Age of Onset   Healthy Mother    Stroke Sister    Hypertension Sister    Cancer Maternal Grandmother        uterine   Other Neg Hx    Heart disease Neg Hx     Social History Reviewed with no changes to be made today.   Outpatient Medications Prior to Visit  Medication Sig Dispense Refill   acetaminophen  (TYLENOL ) 500 MG tablet Take 1,000 mg by mouth as needed for mild  pain (pain score 1-3) or moderate pain (pain score 4-6).     albuterol  (VENTOLIN  HFA) 108 (90 Base) MCG/ACT inhaler Inhale 2 puffs into the lungs every 4 (four) hours as needed for wheezing or shortness of breath. 18 g 0   aspirin  81 MG chewable tablet Chew 1 tablet (81 mg total) by mouth daily. 30 tablet 1   atorvastatin  (LIPITOR) 40 MG tablet Take 1 tablet (40 mg total) by mouth daily. 90 tablet 1   empagliflozin  (JARDIANCE ) 10 MG TABS tablet Take 1 tablet (10 mg total) by mouth daily before breakfast. 90 tablet 3   empagliflozin  (JARDIANCE ) 10 MG TABS tablet Take 1 tablet (10 mg total) by mouth daily before breakfast. 28 tablet    fluticasone  (FLONASE ) 50 MCG/ACT nasal spray Place 2 sprays into both nostrils daily. 9.9 mL 2   ketoconazole  (NIZORAL ) 2 % shampoo Apply 1 Application topically 2 (two) times a week. 120 mL 1   Melatonin 10 MG TABS Take 10 mg by mouth at bedtime. 90 tablet 1   methimazole  (  TAPAZOLE ) 5 MG tablet Take 1 tablet (5 mg total) by mouth 2 (two) times daily. 180 tablet 1   metoprolol  succinate (TOPROL -XL) 50 MG 24 hr tablet Take 1.5 tablets (75 mg total) by mouth daily. Take with or immediately following a meal. 135 tablet 1   Polyethylene Glycol 4500 POWD 17 g by Does not apply route daily at 6 (six) AM. Dissolve 1 capful of powder in one 8 ounce glass of water. Repeat daily or as needed to soften stool (Patient taking differently: 17 g by Does not apply route daily as needed (constipation).) 500 g 0   potassium chloride  SA (KLOR-CON  M20) 20 MEQ tablet Take 2 tablets (40 mEq total) by mouth daily. 180 tablet 1   sacubitril -valsartan  (ENTRESTO ) 49-51 MG Take 1 tablet by mouth 2 (two) times daily. 90 tablet 3   spironolactone  (ALDACTONE ) 25 MG tablet Take 1 tablet (25 mg total) by mouth daily. 90 tablet 1   torsemide  (DEMADEX ) 20 MG tablet Take 1 tablet (20 mg total) by mouth daily. 90 tablet 1   No facility-administered medications prior to visit.    Allergies  Allergen  Reactions   Ace Inhibitors Cough       Objective:    BP 125/74 (BP Location: Left Arm, Patient Position: Sitting, Cuff Size: Large)   Pulse 67   Resp 19   Ht 5\' 8"  (1.727 m)   Wt 201 lb 9.6 oz (91.4 kg)   SpO2 100%   BMI 30.65 kg/m  Wt Readings from Last 3 Encounters:  10/05/23 201 lb 9.6 oz (91.4 kg)  08/26/23 200 lb (90.7 kg)  08/26/23 198 lb 6.4 oz (90 kg)    Physical Exam Vitals and nursing note reviewed.  Constitutional:      Appearance: She is well-developed.  HENT:     Head: Normocephalic and atraumatic.     Right Ear: Drainage present. No hemotympanum. Tympanic membrane is bulging. Tympanic membrane is not injected, scarred or perforated.     Left Ear: Drainage present. No hemotympanum. Tympanic membrane is bulging. Tympanic membrane is not injected, scarred or perforated.  Cardiovascular:     Rate and Rhythm: Normal rate and regular rhythm.     Heart sounds: Normal heart sounds. No murmur heard.    No friction rub. No gallop.  Pulmonary:     Effort: Pulmonary effort is normal. No tachypnea or respiratory distress.     Breath sounds: Normal breath sounds. No decreased breath sounds, wheezing, rhonchi or rales.  Chest:     Chest wall: No tenderness.  Abdominal:     General: Bowel sounds are normal.     Palpations: Abdomen is soft.  Musculoskeletal:        General: Normal range of motion.     Cervical back: Normal range of motion.  Skin:    General: Skin is warm and dry.  Neurological:     Mental Status: She is alert and oriented to person, place, and time.     Coordination: Coordination normal.  Psychiatric:        Behavior: Behavior normal. Behavior is cooperative.        Thought Content: Thought content normal.        Judgment: Judgment normal.          Patient has been counseled extensively about nutrition and exercise as well as the importance of adherence with medications and regular follow-up. The patient was given clear instructions to go to ER  or return to medical center  if symptoms don't improve, worsen or new problems develop. The patient verbalized understanding.   Follow-up: Return for see NEWLIN in 2-3 months. .   Bronco Mcgrory W Trusten Hume, FNP-BC [facility] and [facility] [place], [place] [phone]   10/05/2023, 11:04 AM

## 2023-10-05 NOTE — Progress Notes (Signed)
 Knee pain

## 2023-10-06 ENCOUNTER — Telehealth: Payer: Self-pay | Admitting: Internal Medicine

## 2023-10-06 ENCOUNTER — Ambulatory Visit: Payer: Self-pay | Admitting: Nurse Practitioner

## 2023-10-06 LAB — URIC ACID: Uric Acid: 3.2 mg/dL (ref 3.0–7.2)

## 2023-10-06 LAB — HEMOGLOBIN A1C
Est. average glucose Bld gHb Est-mCnc: 146 mg/dL
Hgb A1c MFr Bld: 6.7 % — ABNORMAL HIGH (ref 4.8–5.6)

## 2023-10-06 NOTE — Telephone Encounter (Signed)
 Pt c/o medication issue:  1. Name of Medication: Jardiance   2. How are you currently taking this medication (dosage and times per day)?   3. Are you having a reaction (difficulty breathing--STAT)?   4. What is your medication issue? Trying to get an yeast infection, and breaking out in a rash on  her left arm

## 2023-10-06 NOTE — Telephone Encounter (Signed)
 Patient identification verified by 2 forms.   Called and spoke to patient  Patient states:  -Left arm is breaking out "sore red bumps" -I think I'm getting a yeast infection - it's burning when I pee -It started after I started this medication.  -Pt did not take it today.  -She reached out to her PCP and was told to    Interventions/Plan: -Will get message to Dr. Doyle Generous inbox and pharm-D for advise.   Patient agrees with plan, no questions at this time

## 2023-10-06 NOTE — Telephone Encounter (Signed)
 Called pt, relayed message. She verbalized understanding. Medication list updated.

## 2023-10-07 ENCOUNTER — Other Ambulatory Visit: Payer: Self-pay

## 2023-10-07 ENCOUNTER — Telehealth: Payer: Self-pay | Admitting: Family Medicine

## 2023-10-07 ENCOUNTER — Telehealth: Payer: Self-pay

## 2023-10-07 MED ORDER — FLUCONAZOLE 150 MG PO TABS
150.0000 mg | ORAL_TABLET | Freq: Once | ORAL | 0 refills | Status: DC
Start: 2023-10-07 — End: 2023-10-07

## 2023-10-07 MED ORDER — TRIAMCINOLONE ACETONIDE 0.1 % EX CREA
1.0000 | TOPICAL_CREAM | Freq: Two times a day (BID) | CUTANEOUS | 0 refills | Status: DC
  Filled 2023-10-07: qty 30, 28d supply, fill #0

## 2023-10-07 MED ORDER — FLUCONAZOLE 150 MG PO TABS
150.0000 mg | ORAL_TABLET | Freq: Once | ORAL | 0 refills | Status: AC
  Filled 2023-10-07: qty 1, 1d supply, fill #0

## 2023-10-07 NOTE — Telephone Encounter (Signed)
 Patient states that she is needing Diflucan  pill sent to [facility].  Patient also states that she had an allergic reaction to Jardiance  medication, she is requesting cream. See office visit with Zelda on 10/05/2023

## 2023-10-07 NOTE — Telephone Encounter (Signed)
 Copied from CRM [id number]. Topic: General - Other >> Oct 07, 2023 11:04 AM Opal Bill wrote:  Reason for CRM: Pt asking to speak with the nurse in regards to a medication for yeast infection. [phone]

## 2023-10-07 NOTE — Addendum Note (Signed)
 Addended by: Ibraheem Voris on: 10/07/2023 05:44 PM   Modules accepted: Orders

## 2023-10-07 NOTE — Telephone Encounter (Signed)
 Diflucan  and Triamcinolone  cream have been sent to Pharmacy.

## 2023-10-07 NOTE — Telephone Encounter (Signed)
 Message has been sent to provider.

## 2023-10-10 ENCOUNTER — Other Ambulatory Visit: Payer: Self-pay

## 2023-10-10 NOTE — Telephone Encounter (Signed)
Patient called and informed of medication being sent.

## 2023-10-11 ENCOUNTER — Other Ambulatory Visit: Payer: Self-pay

## 2023-10-12 DIAGNOSIS — M5416 Radiculopathy, lumbar region: Secondary | ICD-10-CM | POA: Diagnosis not present

## 2023-10-18 ENCOUNTER — Ambulatory Visit: Payer: Self-pay

## 2023-10-18 ENCOUNTER — Other Ambulatory Visit: Payer: Self-pay

## 2023-10-18 ENCOUNTER — Other Ambulatory Visit: Payer: Self-pay | Admitting: Family Medicine

## 2023-10-18 MED ORDER — POTASSIUM CHLORIDE CRYS ER 20 MEQ PO TBCR
40.0000 meq | EXTENDED_RELEASE_TABLET | Freq: Every day | ORAL | 1 refills | Status: DC
  Filled 2023-10-18 – 2023-12-21 (×2): qty 180, 90d supply, fill #0

## 2023-10-18 NOTE — Telephone Encounter (Signed)
 Chief Complaint: Fall Symptoms: Weakness Frequency: Sunday Pertinent Negatives: Patient denies dizziness, HA, weakness, numbness Disposition: [] ED /[] Urgent Care (no appt availability in office) / [] Appointment(In office/virtual)/ []  [place] [facility]/ [] Home Care/ [] Refused Recommended Disposition /[] [place] [facility]/ [x]  Follow-up with PCP Additional Notes: Pt calling to advise she had a fall in the shower Sunday, landed on "my bottom" denies head injury. Pt notes her legs "gave out" and she is requesting PT/OT orders as well as speech and a script for a home BP cuff. Pt notes she is just "trying to get better". Routing to provider for review/recommendations. This RN educated pt on home care, new-worsening symptoms, when to call back/seek emergent care. Pt verbalized understanding and agrees to plan.    Copied from CRM [id number]. Topic: Clinical - Red Word Triage >> Oct 18, 2023  8:40 AM Chelsea Branch T wrote: Red Word that prompted transfer to Nurse Triage: patient stated she fell in the shower on Sunday in the shower. She fell backwards on her bottom and is asking to be put back in speech therapy and physical therapy as she is now having difficulty walking. Reason for Disposition  [1] Caller has NON-URGENT question AND [2] triager unable to answer question  Answer Assessment - Initial Assessment Questions 1. MECHANISM: "How did the fall happen?"     Was in shower and "legs went out"  3. ONSET: "When did the fall happen?" (e.g., minutes, hours, or days ago)     Sunday 4. LOCATION: "What part of the body hit the ground?" (e.g., back, buttocks, head, hips, knees, hands, head, stomach)     "My bottom" did not hit head 5. INJURY: "Did you hurt (injure) yourself when you fell?" If Yes, ask: "What did you injure? Tell me more about this?" (e.g., body area; type of injury; pain severity)"     None 6. PAIN: "Is there any pain?" If Yes, ask: "How bad is the pain?" (e.g., Scale 1-10; or mild,   moderate, severe)   - NONE (0): No pain   - MILD (1-3): Doesn't interfere with normal activities    - MODERATE (4-7): Interferes with normal activities or awakens from sleep    - SEVERE (8-10): Excruciating pain, unable to do any normal activities      None 7. SIZE: For cuts, bruises, or swelling, ask: "How large is it?" (e.g., inches or centimeters)      None 9. OTHER SYMPTOMS: "Do you have any other symptoms?" (e.g., dizziness, fever, weakness; new onset or worsening).      None 10. CAUSE: "What do you think caused the fall (or falling)?" (e.g., tripped, dizzy spell)       Weakness  Protocols used: Falls and [facility]

## 2023-10-18 NOTE — Telephone Encounter (Signed)
 Noted

## 2023-10-19 ENCOUNTER — Telehealth: Payer: Self-pay | Admitting: Family Medicine

## 2023-10-19 DIAGNOSIS — I6930 Unspecified sequelae of cerebral infarction: Secondary | ICD-10-CM

## 2023-10-19 NOTE — Telephone Encounter (Signed)
 Copied from CRM [id number]. Topic: General - Call Back - No Documentation >> Oct 19, 2023  4:10 PM Stanly Early wrote:  Reason for CRM: patient is still waiting for a callback  about medical concerns and bp machine [phone]

## 2023-10-20 ENCOUNTER — Telehealth: Payer: Self-pay

## 2023-10-20 ENCOUNTER — Other Ambulatory Visit: Payer: Self-pay

## 2023-10-20 MED ORDER — MISC. DEVICES MISC: 0 refills | Status: AC

## 2023-10-20 NOTE — Telephone Encounter (Signed)
 Order for BP machine has been sent to [facility].

## 2023-10-20 NOTE — Telephone Encounter (Signed)
 Patient was called and she states that she is needing order for PT/OT and Speech. She was discharged from PT on 09/01/23(note in Epic)

## 2023-10-21 ENCOUNTER — Other Ambulatory Visit: Payer: Self-pay

## 2023-10-24 NOTE — Telephone Encounter (Signed)
 Patient was called and informed of orders being placed.

## 2023-10-24 NOTE — Addendum Note (Signed)
 Addended by: Curlee Bogan on: 10/24/2023 08:17 AM   Modules accepted: Orders

## 2023-10-24 NOTE — Telephone Encounter (Signed)
 Orders have been placed.

## 2023-10-31 ENCOUNTER — Ambulatory Visit: Admitting: Internal Medicine

## 2023-11-01 ENCOUNTER — Ambulatory Visit: Payer: Self-pay

## 2023-11-01 ENCOUNTER — Ambulatory Visit: Admitting: Cardiology

## 2023-11-01 ENCOUNTER — Telehealth: Payer: Self-pay

## 2023-11-01 NOTE — Telephone Encounter (Signed)
    FYI Only or Action Required?: FYI only for provider  Patient was last seen in primary care on 10/05/2023 by Collins Dean, NP. Called Nurse Triage reporting Depression. Symptoms began today. Interventions attempted: Nothing. Symptoms are: unchanged.  Triage Disposition: No disposition on file.  Patient/caregiver understands and will follow disposition?: Copied from CRM [id number]. Topic: Clinical - Red Word Triage >> Nov 01, 2023  8:56 AM El Gravely T wrote: Red Word that prompted transfer to Nurse Triage: Crying a lot, don't know why, feels extremely depressed. Reason for Disposition  [1] Depression AND [2] worsening (e.g., sleeping poorly, less able to do activities of daily living)  Answer Assessment - Initial Assessment Questions 1. CONCERN: "What happened that made you call today?"     "I was trying to get some help and talk to a Child psychotherapist, I've had two strokes". 2. DEPRESSION SYMPTOM SCREENING: "How are you feeling overall?" (e.g., decreased energy, increased sleeping or difficulty sleeping, difficulty concentrating, feelings of sadness, guilt, hopelessness, or worthlessness)     Crying a lot, difficulty sleeping,  3. RISK OF HARM - SUICIDAL IDEATION:  "Do you ever have thoughts of hurting or killing yourself?"  (e.g., yes, no, no but preoccupation with thoughts about death)   - INTENT:  "Do you have thoughts of hurting or killing yourself right NOW?" (e.g., yes, no, N/A)   - PLAN: "Do you have a specific plan for how you would do this?" (e.g., gun, knife, overdose, no plan, N/A)     no 4. RISK OF HARM - HOMICIDAL IDEATION:  "Do you ever have thoughts of hurting or killing someone else?"  (e.g., yes, no, no but preoccupation with thoughts about death)   - INTENT:  "Do you have thoughts of hurting or killing someone right NOW?" (e.g., yes, no, N/A)   - PLAN: "Do you have a specific plan for how you would do this?" (e.g., gun, knife, no plan, N/A)      no 5. FUNCTIONAL IMPAIRMENT:  "How have things been going for you overall? Have you had more difficulty than usual doing your normal daily activities?"  (e.g., better, same, worse; self-care, school, work, interactions)     sometimes 6. SUPPORT: "Who is with you now?" "Who do you live with?" "Do you have family or friends who you can talk to?"      yes 7. THERAPIST: "Do you have a counselor or therapist? Name?"     no 8. STRESSORS: "Has there been any new stress or recent changes in your life?"     stroke 9. ALCOHOL USE OR SUBSTANCE USE (DRUG USE): "Do you drink alcohol or use any illegal drugs?"     no 10. OTHER: "Do you have any other physical symptoms right now?" (e.g., fever)       no 11. PREGNANCY: "Is there any chance you are pregnant?" "When was your last menstrual period?"       no  Protocols used: Depression-A-AH

## 2023-11-01 NOTE — Progress Notes (Deleted)
  Electrophysiology Office Note:   Date:  11/01/2023  ID:  Chelsea Branch, DOB 1966-04-30, MRN [id number]  Primary Cardiologist: Richardo Chandler, MD Electrophysiologist: Ardeen Kohler, MD  {Click to update primary MD,subspecialty MD or APP then REFRESH:1}    History of Present Illness:   Chelsea Branch is a 58 y.o. female with h/o chronic systolic heart failure secondary to nonischemic cardiomyopathy (previously felt to be peripartum) with recovery then subsequent decrease, HTN, stroke, hyperthyroidism who is being seen today for follow up.  Discussed the use of AI scribe software for clinical note transcription with the patient, who gave verbal consent to proceed.  History of Present Illness     Review of systems complete and found to be negative unless listed in HPI.   EP Information / Studies Reviewed:    {EKGtoday:28818}      Zio 06/01/23:  Patch Wear Time:  6 days and 2 hours (2024-12-23T20:40:27-0500 to 2024-12-29T22:56:11-498)   Patient had a min HR of 43 bpm, max HR of 118 bpm, and avg HR of 69 bpm. Predominant underlying rhythm was Sinus Rhythm. First Degree AV Block was present.  1 run of Ventricular Tachycardia occurred lasting 4 beats with a max rate of 118 bpm (avg 106  bpm). 1 run of Supraventricular Tachycardia occurred lasting 8 beats with a max rate of 108 bpm (avg 102 bpm).    Isolated SVEs were rare (<1.0%), SVE Couplets were rare (<1.0%), and SVE Triplets were rare (<1.0%). Isolated VEs were rare (<1.0%), VE  Couplet   No significant ectopy  Echo 05/31/23: 1. Agitated saline contrast bubble study was negative, with no evidence  of any interatrial shunt.   2. Left ventricular ejection fraction by 3D volume is 34 %. The left  ventricle demonstrates global hypokinesis. The left ventricular internal  cavity size was moderately dilated. Left ventricular diastolic parameters  are consistent with Grade I diastolic   dysfunction (impaired relaxation).   3. Mild to  moderate mitral valve regurgitation.   Physical Exam:   VS:  There were no vitals taken for this visit.   Wt Readings from Last 3 Encounters:  10/05/23 201 lb 9.6 oz (91.4 kg)  08/26/23 200 lb (90.7 kg)  08/26/23 198 lb 6.4 oz (90 kg)     GEN: Well nourished, well developed in no acute distress NECK: No JVD CARDIAC: {EPRHYTHM:28826}, no murmurs, rubs, gallops RESPIRATORY:  Clear to auscultation without rales, wheezing or rhonchi  ABDOMEN: Soft, non-distended EXTREMITIES:  No edema; No deformity   ASSESSMENT AND PLAN:    #. Chronic systolic heart failure:  Event recorder failed to demonstrate atrial fibrillation.  Will hold off on implantation of a loop recorder for atrial fibrillation diagnosis as we await the decision regarding device implantation--intervening on Linq identified atrial fibrillation is not clearly associated with improved outcomes so we have the privilege of waiting   #. Stroke:  Follow up with {EPMDS:28135::"EP Team"} {EPFOLLOW BM:84132}  Signed, Ardeen Kohler, MD

## 2023-11-01 NOTE — Telephone Encounter (Signed)
 FYI

## 2023-11-01 NOTE — Telephone Encounter (Signed)
 Patient was called and MyChart video visit was scheduled for patient.

## 2023-11-01 NOTE — Telephone Encounter (Signed)
 Please schedule an office visit.  Could be in person or virtual.

## 2023-11-01 NOTE — Telephone Encounter (Signed)
 Left message for patient to call back. Appointment with Dr. Daneil Dunker needs to be moved until after her echo.

## 2023-11-03 ENCOUNTER — Encounter: Payer: Self-pay | Admitting: Family Medicine

## 2023-11-03 ENCOUNTER — Other Ambulatory Visit: Payer: Self-pay | Admitting: Pharmacist

## 2023-11-03 ENCOUNTER — Other Ambulatory Visit: Payer: Self-pay

## 2023-11-03 ENCOUNTER — Ambulatory Visit: Attending: Family Medicine | Admitting: Family Medicine

## 2023-11-03 DIAGNOSIS — E0590 Thyrotoxicosis, unspecified without thyrotoxic crisis or storm: Secondary | ICD-10-CM

## 2023-11-03 DIAGNOSIS — G4709 Other insomnia: Secondary | ICD-10-CM

## 2023-11-03 DIAGNOSIS — E1169 Type 2 diabetes mellitus with other specified complication: Secondary | ICD-10-CM

## 2023-11-03 DIAGNOSIS — F320 Major depressive disorder, single episode, mild: Secondary | ICD-10-CM

## 2023-11-03 DIAGNOSIS — R635 Abnormal weight gain: Secondary | ICD-10-CM

## 2023-11-03 DIAGNOSIS — E119 Type 2 diabetes mellitus without complications: Secondary | ICD-10-CM | POA: Diagnosis not present

## 2023-11-03 MED ORDER — HYDROXYZINE HCL 25 MG PO TABS
25.0000 mg | ORAL_TABLET | Freq: Every evening | ORAL | 1 refills | Status: DC | PRN
  Filled 2023-11-03: qty 30, 30d supply, fill #0
  Filled 2024-03-05: qty 30, 30d supply, fill #1

## 2023-11-03 MED ORDER — FLUOXETINE HCL 20 MG PO TABS
20.0000 mg | ORAL_TABLET | Freq: Every day | ORAL | 1 refills | Status: DC
  Filled 2023-11-03: qty 90, 90d supply, fill #0

## 2023-11-03 MED ORDER — FLUOXETINE HCL 20 MG PO CAPS
20.0000 mg | ORAL_CAPSULE | Freq: Every day | ORAL | 1 refills | Status: DC
  Filled 2023-11-03: qty 90, 90d supply, fill #0
  Filled 2024-03-05: qty 90, 90d supply, fill #1

## 2023-11-03 NOTE — Patient Instructions (Signed)
 VISIT SUMMARY:  During your visit, we discussed your recent symptoms of depression, concerns about weight gain, and management of your hyperthyroidism and diabetes. We also reviewed general health maintenance, particularly focusing on weight management to prevent heart-related complications.  YOUR PLAN:  -DEPRESSION: Depression is a mood disorder characterized by persistent feelings of sadness and loss of interest. Your symptoms may be linked to your previous stroke. We have prescribed Prozac 20 mg daily to help manage your symptoms and referred you to counseling at [facility]. Additionally, hydroxyzine  has been prescribed to help with sleep.  -HYPERTHYROIDISM: Hyperthyroidism is a condition where your thyroid  gland produces too much thyroid  hormone. It is important to continue taking methimazole  to prevent complications. We will schedule an appointment to check your thyroid  levels and discuss weight management options.  -DIABETES MELLITUS: Diabetes Mellitus is a condition that affects how your body processes blood sugar. We discussed weight management options that may help control your diabetes. We will schedule an office visit to further discuss these options and perform necessary blood work.  -GENERAL HEALTH MAINTENANCE: Maintaining a healthy weight is crucial to prevent heart-related complications. Please continue to monitor your weight and maintain a healthy lifestyle.  INSTRUCTIONS:  Please follow up with the cardiologist on July 1st as scheduled. Additionally, we will schedule an appointment to check your thyroid  levels and discuss weight management options. Make sure to attend counseling sessions at [facility] and take your medications as prescribed.

## 2023-11-03 NOTE — Progress Notes (Signed)
 Virtual Visit via Video Note  I connected with Chelsea Branch, on 11/03/2023 at 8:37 AM by video enabled telemedicine device and verified that I am speaking with the correct person using two identifiers.  Clinician has audio - video capabilities but patient was only able to operate/preferred audio.    Consent: I discussed the limitations, risks, security and privacy concerns of performing an evaluation and management service by telemedicine and the availability of in person appointments. I also discussed with the patient that there may be a patient responsible charge related to this service. The patient expressed understanding and agreed to proceed.   Location of Patient: Home  Location of Provider: Clinic   Persons participating in Telemedicine visit: Brandolyn Shortridge Dr. Adan Holms    Discussed the use of AI scribe software for clinical note transcription with the patient, who gave verbal consent to proceed.  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  She experiences frequent crying, lack of rest, and occasional lack of motivation to get out of bed, without suicidal ideation or intent to harm others. Her depressive symptoms began after her last visit in May and are associated with her stroke history. She has difficulty sleeping at night and sometimes feels anxious.  She is concerned about weight gain, which she attributes to increased appetite from methimazole . She has not been taking methimazole  recently and is closely monitoring her weight due to heart health concerns.        Past Medical History:  Diagnosis Date    Abnormal thyroid  blood test 03/15/2017   Abnormal uterine bleeding    Alopecia    Anemia    Angina pectoris with normal coronary arteriogram (HCC) 2017   Had + Troponin c/w ? 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 iniatially ?2/2 peripartum in 1994 - improved by 2008 then worsening EF in 2011 back down to EF 25-30%. b. echo 01/21/14 showed mod LVH, [name]; 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   Peripartum cardiomyopathy 1994   Sleep apnea 2015   CPAP 12/2013   Stroke (HCC) 2011   Systolic and diastolic CHF, acute on chronic (HCC) 06/14/2015   Tobacco abuse    Type 2 diabetes mellitus (HCC) 04/28/2023   Allergies  Allergen Reactions   Ace Inhibitors Cough   Jardiance  [Empagliflozin ] Rash    Yeast infection/rash/UTI    Current Outpatient Medications on File Prior to Visit  Medication Sig Dispense Refill   acetaminophen  (TYLENOL ) 500 MG tablet Take 1,000 mg by mouth as needed for mild pain (pain score 1-3) or moderate pain (pain score 4-6).  albuterol  (VENTOLIN  HFA) 108 (90 Base) MCG/ACT inhaler Inhale 2 puffs into the lungs every 4 (four) hours as needed for wheezing or shortness of breath. 18 g 0   aspirin  81 MG chewable tablet Chew 1 tablet (81 mg total) by mouth daily. 30 tablet 1   atorvastatin  (LIPITOR) 40 MG tablet Take 1 tablet (40 mg total) by mouth daily. 90 tablet 1   ciprofloxacin -dexamethasone  (CIPRODEX ) OTIC suspension Place 4 drops into both ears 2 (two) times daily. 7.5 mL 0    fluticasone  (FLONASE ) 50 MCG/ACT nasal spray Place 2 sprays into both nostrils daily. 9.9 mL 2   ketoconazole  (NIZORAL ) 2 % shampoo Apply 1 Application topically 2 (two) times a week. 120 mL 1   Melatonin 10 MG TABS Take 10 mg by mouth at bedtime. 90 tablet 1   methimazole  (TAPAZOLE ) 5 MG tablet Take 1 tablet (5 mg total) by mouth 2 (two) times daily. 180 tablet 1   metoprolol  succinate (TOPROL -XL) 50 MG 24 hr tablet Take 1.5 tablets (75 mg total) by mouth daily. Take with or immediately following a meal. 135 tablet 1   Misc. Devices MISC Blood Pressure Monitor 1 each 0   Polyethylene Glycol 4500 POWD 17 g by Does not apply route daily at 6 (six) AM. Dissolve 1 capful of powder in one 8 ounce glass of water. Repeat daily or as needed to soften stool (Patient taking differently: 17 g by Does not apply route daily as needed (constipation).) 500 g 0   potassium chloride  SA (KLOR-CON  M20) 20 MEQ tablet Take 2 tablets (40 mEq total) by mouth daily. 180 tablet 1   sacubitril -valsartan  (ENTRESTO ) 49-51 MG Take 1 tablet by mouth 2 (two) times daily. 90 tablet 3   spironolactone  (ALDACTONE ) 25 MG tablet Take 1 tablet (25 mg total) by mouth daily. 90 tablet 1   torsemide  (DEMADEX ) 20 MG tablet Take 1 tablet (20 mg total) by mouth daily. 90 tablet 1   triamcinolone  cream (KENALOG ) 0.1 % Apply 1 Application topically 2 (two) times daily. 30 g 0   No current facility-administered medications on file prior to visit.    ROS: See HPI  Observations/Objective: Awake, alert, oriented x3 Not in acute distress Normal mood      Latest Ref Rng & Units 07/06/2023   11:49 AM 04/20/2023    8:34 AM 04/11/2023    1:59 PM  CMP  Glucose 70 - 99 mg/dL 90  99  161   BUN 6 - 24 mg/dL 13  6  12    Creatinine 0.57 - 1.00 mg/dL 0.96  0.45  4.09   Sodium 134 - 144 mmol/L 139  142  139   Potassium 3.5 - 5.2 mmol/L 4.2  4.1  3.0   Chloride 96 - 106 mmol/L 102  106  98   CO2 20 - 29 mmol/L 22     Calcium  8.7 - 10.2  mg/dL 9.0     Total Protein 6.0 - 8.5 g/dL 6.1     Total Bilirubin 0.0 - 1.2 mg/dL 0.6     Alkaline Phos 44 - 121 IU/L 105     AST 0 - 40 IU/L 13     ALT 0 - 32 IU/L 17       Lipid Panel     Component Value Date/Time   CHOL 121 07/06/2023 1149   TRIG 68 07/06/2023 1149   HDL 52 07/06/2023 1149   CHOLHDL 2.3 07/06/2023 1149   CHOLHDL 3.5 04/12/2023 0727  VLDL 12 04/12/2023 0727   LDLCALC 55 07/06/2023 1149   LABVLDL 14 07/06/2023 1149    Lab Results  Component Value Date   HGBA1C 6.7 (H) 10/05/2023    Lab Results  Component Value Date   TSH 1.320 06/21/2023     Assessment and plan:     Depression Experiencing depressive symptoms for a month, possibly linked to previous stroke. No suicidal ideation. Discussed treatment options including medication and counseling. - Prescribe Prozac 20 mg daily. - Refer to counseling at [facility]. - Prescribe hydroxyzine  for sleep.  Hyperthyroidism Non-compliance with methimazole  due to weight gain concerns. Emphasized importance of medication to prevent complications. Thyroid  levels need rechecking. - Advise continuation of methimazole . - Schedule appointment for thyroid  level check. - Discuss weight management options at office visit.  Type II Diabetes Mellitus A1c of 6.7 Given concern about weight gain she may be a candidate for GLP-1 receptor agonist - Schedule office visit to discuss weight management options   General Health Maintenance Emphasized weight management to prevent heart-related complications. - Advise monitoring weight and maintaining a healthy lifestyle.         Meds ordered this encounter  Medications   FLUoxetine (PROZAC) 20 MG tablet    Sig: Take 1 tablet (20 mg total) by mouth daily.    Dispense:  90 tablet    Refill:  1   hydrOXYzine  (ATARAX ) 25 MG tablet    Sig: Take 1 tablet (25 mg total) by mouth at bedtime as needed.    Dispense:  30 tablet    Refill:  1     Follow Up Instructions: 1 month for chronic medical conditions   I discussed the assessment and treatment plan with the patient. The patient was provided an opportunity to ask questions and all were answered. The patient agreed with the plan and demonstrated an understanding of the instructions.   The patient was advised to call back or seek an in-person evaluation if the symptoms worsen or if the condition fails to improve as anticipated.     I provided 19 minutes total of Telehealth time during this encounter including median intraservice time, reviewing previous notes, investigations, ordering medications, medical decision making, coordinating care and patient verbalized understanding at the end of the visit.     Joaquin Mulberry, MD, FAAFP. [facility] [place], [place] [phone]   11/03/2023, 8:37 AM

## 2023-11-04 ENCOUNTER — Ambulatory Visit: Payer: Self-pay | Admitting: Physical Therapy

## 2023-11-04 ENCOUNTER — Other Ambulatory Visit: Payer: Self-pay

## 2023-11-05 ENCOUNTER — Ambulatory Visit (HOSPITAL_COMMUNITY)
Admission: EM | Admit: 2023-11-05 | Discharge: 2023-11-05 | Disposition: A | Discharge status: Home / Self Care | Attending: Family Medicine | Admitting: Family Medicine

## 2023-11-05 DIAGNOSIS — U071 COVID-19: Secondary | ICD-10-CM

## 2023-11-05 DIAGNOSIS — R519 Headache, unspecified: Secondary | ICD-10-CM

## 2023-11-05 LAB — POC COVID19/FLU A&B COMBO
Covid Antigen, POC: POSITIVE — AB
Influenza A Antigen, POC: NEGATIVE
Influenza B Antigen, POC: NEGATIVE

## 2023-11-05 MED ORDER — HYDROCODONE BIT-HOMATROP MBR 5-1.5 MG/5ML PO SOLN: 5.0000 mL | Freq: Four times a day (QID) | ORAL | 0 refills | Status: DC | PRN

## 2023-11-05 NOTE — ED Triage Notes (Signed)
 Patient presents to the office for headache,chills and cough x 3 days.

## 2023-11-07 NOTE — ED Provider Notes (Signed)
 [facility]   [id number] 11/05/23 Arrival Time: 1001  ASSESSMENT & PLAN:  1. Acute nonintractable headache, unspecified headache type   2. COVID-19 virus infection    HA likely from virus/COVID; discussed. Discussed typical duration of likely viral illness. Results for orders placed or performed during the hospital encounter of 11/05/23  POC Covid19/Flu A&B Antigen   Collection Time: 11/05/23 10:35 AM  Result Value Ref Range   Influenza A Antigen, POC Negative Negative   Influenza B Antigen, POC Negative Negative   Covid Antigen, POC Positive (A) Negative   OTC symptom care as needed.  Meds ordered this encounter  Medications   HYDROcodone  bit-homatropine (HYCODAN) 5-1.5 MG/5ML syrup    Sig: Take 5 mLs by mouth every 6 (six) hours as needed for cough.    Dispense:  90 mL    Refill:  0   Med sedation precautions.    Follow-up Information     Joaquin Mulberry, MD.   Specialty: Family Medicine Why: As needed. Contact information: [address] [place] [address] [place] [address] [phone]                 Reviewed expectations re: course of current medical issues. Questions answered. Outlined signs and symptoms indicating need for more acute intervention. Understanding verbalized. After Visit Summary given.   SUBJECTIVE: History from: Patient. Chelsea Branch is a 58 y.o. female. Patient presents to the office for headache,chills and cough x 3 days.  Denies: fever. Normal PO intake without n/v/d.  OBJECTIVE:  Vitals:   11/05/23 1011  BP: 118/87  Pulse: (!) 101  Resp: 18  Temp: 99.1 F (37.3 C)  TempSrc: Oral  SpO2: 95%    General appearance: alert; no distress Eyes: PERRLA; EOMI; conjunctiva normal HENT: [place]; AT; with nasal congestion Neck: supple  Lungs: speaks full sentences without difficulty; unlabored; CTAB Extremities: no edema Skin: warm and dry Neurologic: normal gait; CN 2-12 grossly intact Psychological: alert and  cooperative; normal mood and affect  Labs: Results for orders placed or performed during the hospital encounter of 11/05/23  POC Covid19/Flu A&B Antigen   Collection Time: 11/05/23 10:35 AM  Result Value Ref Range   Influenza A Antigen, POC Negative Negative   Influenza B Antigen, POC Negative Negative   Covid Antigen, POC Positive (A) Negative   Labs Reviewed  POC COVID19/FLU A&B COMBO - Abnormal; Notable for the following components:      Result Value   Covid Antigen, POC Positive (*)    All other components within normal limits    Imaging: No results found.  Allergies  Allergen Reactions   Ace Inhibitors Cough   Jardiance  [Empagliflozin ] Rash    Yeast infection/rash/UTI    Past Medical History:  Diagnosis Date   Abnormal thyroid  blood test 03/15/2017   Abnormal uterine bleeding    Alopecia    Anemia    Angina pectoris with normal coronary arteriogram (HCC) 2017   Had + Troponin c/w ? 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 iniatially ?2/2 peripartum in 1994 - improved by 2008 then worsening EF in 2011 back down to  EF 25-30%. b. echo 01/21/14 showed mod LVH, [name]; 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   Peripartum cardiomyopathy 1994   Sleep apnea 2015   CPAP 12/2013   Stroke (HCC) 2011   Systolic and diastolic CHF, acute on chronic (HCC) 06/14/2015   Tobacco abuse    Type 2 diabetes mellitus (HCC) 04/28/2023   Social History   Socioeconomic History   Marital status: Single     Spouse name: Not on file   Number of children: 3   Years of education: 12   Highest education level: Not on file  Occupational History   Occupation: unemployed  Tobacco Use   Smoking status: Former    Current packs/day: 0.00    Average packs/day: 0.1 packs/day for 30.0 years (3.0 ttl pk-yrs)    Types: Cigarettes    Start date: 06/02/1985    Quit date: 06/03/2015    Years since quitting: 8.4   Smokeless tobacco: Never   Tobacco comments:    Pt. stated she stopped smoking a year ago. 09/29/2018  Vaping Use   Vaping status: Never Used  Substance and Sexual Activity   Alcohol use: No    Alcohol/week: 0.0 standard drinks of alcohol   Drug use: No   Sexual activity: Yes    Birth control/protection: I.U.D.    Comment: fibroids  Other Topics Concern   Not on file  Social History Narrative   Lives at home with 59 yo twins Donnella Gain and [place])   49 yo son lives outside the home   59 yo granddaughter       Update 07/12/2023   Stays with her mother, sister, friend here and there, lost her home, signing up for social security, has been sleeping in her car but she doesn't have it anymore. She does not want to go to a shelter. She was working in [employer] work until she had her stroke in November 2024. She gets food stamps and medicaid.   Social Drivers of Health   Financial Resource Strain: High Risk (07/06/2023)   Overall Financial Resource Strain (CARDIA)    Difficulty of Paying Living Expenses: Very hard  Food Insecurity: Food Insecurity Present (07/06/2023)   Hunger Vital Sign    Worried About Running Out of Food in the Last Year: Often true    Ran Out of Food in the Last Year: Sometimes true  Transportation Needs: Unmet Transportation Needs (07/06/2023)   PRAPARE - Transportation    Lack of Transportation (Medical): Yes    Lack of Transportation (Non-Medical): Yes  Physical Activity: Not on file  Stress: Stress Concern Present (07/06/2023)   [employer] of Occupational  Health - Occupational Stress Questionnaire    Feeling of Stress : Rather much  Social Connections: Moderately Integrated (07/06/2023)   Social Connection and Isolation Panel    Frequency of Communication with Friends and Family: Three times a week    Frequency of Social Gatherings with Friends and Family: Once a week    Attends Religious Services: More than 4 times per year    Active Member of [employer] or Organizations: No    Attends Banker Meetings: Never    Marital Status: Living with partner  Intimate Partner Violence: Not At Risk (04/25/2023)   Humiliation, Afraid, Rape, and Kick questionnaire    Fear of Current or Ex-Partner: No  Emotionally Abused: No    Physically Abused: No    Sexually Abused: No   Family History  Problem Relation Age of Onset   Healthy Mother    Stroke Sister    Hypertension Sister    Cancer Maternal Grandmother        uterine   Other Neg Hx    Heart disease Neg Hx    Past Surgical History:  Procedure Laterality Date   CARDIAC CATHETERIZATION N/A 06/16/2015   Procedure: Left Heart Cath and Coronary Angiography;  Surgeon: Lucendia Rusk, MD;  Location: [facility] INVASIVE CV LAB;  Service: Cardiovascular;  Laterality: N/A;   CESAREAN SECTION  1992  1994   INTRAUTERINE DEVICE (IUD) INSERTION     about 3 years ago   LOOP RECORDER IMPLANT  ~ 2000   spine injections      [facility],  Dr Crecencio Dodge   TIBIAL TUBERCLERPLASTY     TRANSESOPHAGEAL ECHOCARDIOGRAM (CATH LAB) N/A 04/20/2023   Procedure: TRANSESOPHAGEAL ECHOCARDIOGRAM;  Surgeon: Bridgette Campus, MD;  Location: [facility] INVASIVE CV LAB;  Service: Cardiovascular;  Laterality: N/A;   TUBAL LIGATION  05/24/1992     Afton Albright, MD 11/07/23 1416

## 2023-11-08 ENCOUNTER — Other Ambulatory Visit: Payer: Self-pay

## 2023-11-09 ENCOUNTER — Other Ambulatory Visit: Payer: Self-pay

## 2023-11-10 ENCOUNTER — Other Ambulatory Visit: Payer: Self-pay

## 2023-11-11 ENCOUNTER — Ambulatory Visit (HOSPITAL_COMMUNITY)

## 2023-11-15 ENCOUNTER — Ambulatory Visit: Payer: Self-pay | Admitting: Physical Therapy

## 2023-11-17 ENCOUNTER — Encounter: Payer: Self-pay | Admitting: Physical Therapy

## 2023-11-17 ENCOUNTER — Other Ambulatory Visit: Payer: Self-pay | Admitting: Family Medicine

## 2023-11-17 ENCOUNTER — Telehealth: Payer: Self-pay | Admitting: Family Medicine

## 2023-11-17 ENCOUNTER — Other Ambulatory Visit: Payer: Self-pay

## 2023-11-17 ENCOUNTER — Ambulatory Visit: Payer: Self-pay | Attending: Critical Care Medicine | Admitting: Physical Therapy

## 2023-11-17 VITALS — BP 141/80 | HR 71

## 2023-11-17 DIAGNOSIS — R269 Unspecified abnormalities of gait and mobility: Secondary | ICD-10-CM | POA: Insufficient documentation

## 2023-11-17 DIAGNOSIS — M6281 Muscle weakness (generalized): Secondary | ICD-10-CM | POA: Diagnosis present

## 2023-11-17 DIAGNOSIS — Z9181 History of falling: Secondary | ICD-10-CM | POA: Insufficient documentation

## 2023-11-17 DIAGNOSIS — I6930 Unspecified sequelae of cerebral infarction: Secondary | ICD-10-CM | POA: Diagnosis not present

## 2023-11-17 DIAGNOSIS — J4520 Mild intermittent asthma, uncomplicated: Secondary | ICD-10-CM

## 2023-11-17 DIAGNOSIS — R2681 Unsteadiness on feet: Secondary | ICD-10-CM | POA: Diagnosis present

## 2023-11-17 DIAGNOSIS — E0590 Thyrotoxicosis, unspecified without thyrotoxic crisis or storm: Secondary | ICD-10-CM

## 2023-11-17 MED ORDER — ALBUTEROL SULFATE HFA 108 (90 BASE) MCG/ACT IN AERS
2.0000 | INHALATION_SPRAY | RESPIRATORY_TRACT | 0 refills | Status: DC | PRN
  Filled 2023-11-17: qty 18, 17d supply, fill #0

## 2023-11-17 MED ORDER — ATORVASTATIN CALCIUM 40 MG PO TABS
40.0000 mg | ORAL_TABLET | Freq: Every day | ORAL | 1 refills | Status: DC
  Filled 2023-11-17: qty 90, 90d supply, fill #0
  Filled 2024-02-20: qty 90, 90d supply, fill #1

## 2023-11-17 NOTE — Telephone Encounter (Signed)
Inhaler has been refilled

## 2023-11-17 NOTE — Therapy (Signed)
 OUTPATIENT PHYSICAL THERAPY NEURO EVALUATION   Patient Name: Chelsea Branch MRN: [id number] DOB:[birth year], 58 y.o., female Today's Date: 11/17/2023   PCP: Delbert Clam, MD REFERRING PROVIDER: Delbert Clam, MD  END OF SESSION:  PT End of Session - 11/17/23 1320     Visit Number 1    Number of Visits 9    Date for PT Re-Evaluation 12/22/23    Authorization Type [place] Medicaid   4 visits used   PT Start Time 1316    PT Stop Time 1356    PT Time Calculation (min) 40 min    Activity Tolerance Patient tolerated treatment well    Behavior During Therapy [facility] for tasks assessed/performed          Past Medical History:  Diagnosis Date   Abnormal thyroid  blood test 03/15/2017   Abnormal uterine bleeding    Alopecia    Anemia    Angina pectoris with normal coronary arteriogram (HCC) 2017   Had + Troponin c/w ? 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 iniatially ?2/2 peripartum in 1994 - improved by 2008 then worsening EF in 2011 back down to EF 25-30%. b. echo 01/21/14 showed mod LVH, [name]; 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   Peripartum cardiomyopathy 1994   Sleep apnea 2015   CPAP 12/2013   Stroke (HCC) 2011   Systolic and diastolic CHF, acute on chronic (HCC) 06/14/2015   Tobacco abuse    Type 2 diabetes mellitus (HCC) 04/28/2023   Past Surgical History:  Procedure Laterality Date   CARDIAC CATHETERIZATION N/A 06/16/2015   Procedure: Left Heart Cath and Coronary Angiography;   Surgeon: Candyce GORMAN Reek, MD;  Location: [facility] INVASIVE CV LAB;  Service: Cardiovascular;  Laterality: N/A;   CESAREAN SECTION  1992  1994   INTRAUTERINE DEVICE (IUD) INSERTION     about 3 years ago   LOOP RECORDER IMPLANT  ~ 2000   spine injections      [facility],  Dr Darlis   TIBIAL TUBERCLERPLASTY     TRANSESOPHAGEAL ECHOCARDIOGRAM (CATH LAB) N/A 04/20/2023   Procedure: TRANSESOPHAGEAL ECHOCARDIOGRAM;  Surgeon: Alvan Ronal BRAVO, MD;  Location: [facility] INVASIVE CV LAB;  Service: Cardiovascular;  Laterality: N/A;   TUBAL LIGATION  05/24/1992   Patient Active Problem List   Diagnosis Date Noted   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   History of stroke with residual effects 07/06/2023   Frequent falls 07/06/2023   Type 2 diabetes mellitus (HCC) 04/28/2023   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) 04/28/2023   History of cerebellar stroke 04/13/2023   History of stroke 04/11/2023   Liletta  IUD (intrauterine device) in place since 12/15/2015 04/06/2023   Chronic pain syndrome 12/28/2021   Primary osteoarthritis of both knees 12/28/2021   Spinal stenosis of lumbar region 07/21/2021   Lumbar radiculitis 07/21/2021   COPD (chronic obstructive pulmonary disease) (HCC) 07/21/2021   Mechanical complication due to intrauterine contraceptive device 07/21/2021   Arthropathy of  lumbar facet joint 03/14/2019   Degenerative disc disease at L5-S1 level 10/10/2018   Hyperthyroidism 04/26/2016   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   History of non-ST elevation myocardial infarction (NSTEMI) 06/14/2015   Homelessness 09/16/2014   Vitamin D  deficiency 01/29/2014   OSA (obstructive sleep apnea) 01/08/2014   Chronic combined systolic and diastolic CHF (congestive heart failure) (HCC) 08/14/2013   Essential hypertension    Fibroids 08/09/2012   Menorrhagia 05/08/2012   Female pattern hair loss    HLD (hyperlipidemia) 06/08/2010   CEREBRAL ANEURYSM  06/08/2010   History of cardiovascular disorder 06/08/2010    ONSET DATE: 10/24/2023 (referral)   REFERRING DIAG: I69.30 (ICD-10-CM) - History of stroke with residual effects  THERAPY DIAG:  Muscle weakness (generalized)  Abnormality of gait and mobility  Unsteadiness on feet  History of falling  Rationale for Evaluation and Treatment: Rehabilitation  SUBJECTIVE:                                                                                                                                                                                             SUBJECTIVE STATEMENT: Presents w/rollator. States she has been really depressed due to inability to work and reduced mobility. Had COVID a few weeks ago so has been under the weather. Denies falls since she was last here in April. Has not been doing her HEP, just working on walking at home. Wants to be independent again and not have to use the rollator but admits she walks much better and has not had any falls since using the rollator.      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  PAIN:  Are you having pain? No  PRECAUTIONS: Fall  RED FLAGS: None   WEIGHT BEARING RESTRICTIONS: No  FALLS: Has patient fallen in last 6 months? Yes. Number of falls 4-5  LIVING ENVIRONMENT: Lives with: lives with their partner Lives in: House/apartment Stairs: No Has following equipment at home: Walker - 4 wheeled  PLOF: Requires assistive device for independence  PATIENT GOALS: to walk to get my mobility like I used to   OBJECTIVE:  Note: Objective measures were completed at Evaluation unless otherwise noted.  DIAGNOSTIC FINDINGS: MRI of brain from  03/2023   IMPRESSION: Acute infarcts in the right occipital lobe, along the posterior aspect of the left insula, and in the left cerebellar  hemisphere with additional tiny foci of possible acute infarction in the left parietal lobe. Findings are suspicious for cardioembolic etiology. Trace petechial hemorrhage in the medial aspect of the right occipital lobe and posterior left insula. No significant mass effect.  COGNITION: Overall cognitive status: Impaired   SENSATION: Pt denies numbness/tingling in BLEs   POSTURE: rounded shoulders, forward head, and increased thoracic kyphosis  LOWER EXTREMITY ROM:     Active  Right Eval Left Eval  Hip flexion    Hip extension    Hip abduction    Hip adduction    Hip internal rotation    Hip external rotation    Knee flexion    Knee extension    Ankle dorsiflexion    Ankle plantarflexion    Ankle inversion    Ankle eversion     (Blank rows = not tested)  LOWER EXTREMITY MMT:    MMT Right Eval Left Eval  Hip flexion    Hip extension    Hip abduction    Hip adduction    Hip internal rotation    Hip external rotation    Knee flexion    Knee extension    Ankle dorsiflexion    Ankle plantarflexion    Ankle inversion    Ankle eversion    (Blank rows = not tested)  BED MOBILITY:  Not tested  TRANSFERS: Sit to stand: Modified independence  Assistive device utilized: Environmental consultant - 4 wheeled     Stand to sit: Modified independence  Assistive device utilized: Environmental consultant - 4 wheeled     Pt demonstrates heavy reliance on BUEs to perform and pushes up on rollator rather than using armrests.   RAMP:  Not tested  CURB:  Not tested  STAIRS: Not tested GAIT: Gait pattern: step through pattern, decreased stride length, lateral hip instability, trunk flexed, poor foot clearance- Right, and poor foot clearance- Left Distance walked: Various clinic distances  Assistive device utilized: Walker - 4 wheeled Level of assistance:  Modified independence Comments: Pt wearing slides this date which enables her to slide her feet on the floor. Pt reports she prefers this as it is easier and does not like wearing tennis shoes. Encouraged pt to wear closed-toe shoes as this is safer and will encouraged her to pick feet up.    FUNCTIONAL TESTS:   [facility] PT Assessment - 11/17/23 1338       Transfers   Five time sit to stand comments  29.47s   BUE support on rollator     Ambulation/Gait   Gait velocity 32.8' over 14.31s = 2.29 ft/s w/rollator   mod I           VITALS  Vitals:   11/17/23 1335  BP: (!) 141/80  Pulse: 71                                                                                                      TREATMENT:   Recommendation to wear tennis shoes Assessed vitals (see above) and WNL Discussed importance of using rollator at all times and being more  independent and functional w/use of rollator vs no AD.     PATIENT EDUCATION: Education details: POC, eval findings, see treatment above  Person educated: Patient Education method: Explanation Education comprehension: verbalized understanding and needs further education  HOME EXERCISE PROGRAM: From previous POC: [id number]  GOALS: Goals reviewed with patient? Yes  STG = LTG DUE TO POC LENGTH   LONG TERM GOALS: Target date: 12/22/2023   Pt will improve 5 x STS to less than or equal to 20 seconds w/proper hand positioning to demonstrate improved functional strength and transfer efficiency.   Baseline: 29.47s w/BUE support on rollator  Goal status: INITIAL  2.  Pt will improve gait velocity to at least 2.5 ft/s w/LRAD for improved gait efficiency and independence  Baseline: 2.29 ft/s w/rollator  Goal status: INITIAL  3.  to be assessed and goal updated  Baseline:  Goal status: INITIAL  4.  Pt will be independent with final HEP for improved strength, balance, transfers and gait.  Baseline: to be reviewed  Goal status:  INITIAL  ASSESSMENT:  CLINICAL IMPRESSION: Patient is a 58 year old female referred to Neuro OPPT for history of stroke. Pt's PMH is significant for: NSTEMI in 2017,  NICM (EF 34%, LV global hypokinesis, LVH, dilated LV, grade 1 DD, mild to moderate MR from echo of 05/2023), hypertension, OSA, right occipital and left cerebellar hemispheric CVA status post TNK therapy with residual dysarthria, hyperthyroidism, type 2 diabetes mellitus. The following deficits were present during the exam: decreased safety awareness, decreased functional strength, impaired balance and decreased cardiovascular endurance. Based on 5x STS and history of falls, pt is an incr risk for falls. Pt would benefit from skilled PT to address these impairments and functional limitations to maximize functional mobility independence.    OBJECTIVE IMPAIRMENTS: Abnormal gait, cardiopulmonary status limiting activity, decreased activity tolerance, decreased balance, decreased cognition, decreased endurance, decreased knowledge of use of DME, decreased mobility, decreased strength, decreased safety awareness, impaired perceived functional ability, impaired vision/preception, improper body mechanics, and pain  ACTIVITY LIMITATIONS: carrying, lifting, squatting, stairs, transfers, hygiene/grooming, locomotion level, and caring for others  PARTICIPATION LIMITATIONS: meal prep, cleaning, interpersonal relationship, driving, shopping, community activity, occupation, and yard work  PERSONAL FACTORS: Age, Fitness, Past/current experiences, Transportation, and 1 comorbidity: history of CVA are also affecting patient's functional outcome.   REHAB POTENTIAL: Good  CLINICAL DECISION MAKING: Evolving/moderate complexity  EVALUATION COMPLEXITY: Moderate  PLAN:  PT FREQUENCY: 1-2x/week  PT DURATION: 4 weeks  PLANNED INTERVENTIONS: 97164- PT Re-evaluation, 97750- Physical Performance Testing, 97110-Therapeutic exercises, 97530- Therapeutic  activity, [id number]- Neuromuscular re-education, 97535- Self Care, [id number]- Manual therapy, [id number]- Gait training, [id number]- Aquatic Therapy, [phone]- Electrical stimulation (manual), [phone] (1-2 muscles), [id number] (3+ muscles)- Dry Needling, Patient/Family education, Balance training, Stair training, Joint mobilization, Spinal mobilization, Vestibular training, and DME instructions  PLAN FOR NEXT SESSION: and update goal. Review previous HEP and update. Work on BLE strength, reaching tasks and endurance   Check all possible CPT codes: See Planned Interventions List for Planned CPT Codes    Check all conditions that are expected to impact treatment: Social determinants of health   If treatment provided at initial evaluation, no treatment charged due to lack of authorization.     Shoshana Johal E Chameka Mcmullen, PT, DPT 11/17/2023, 1:58 PM

## 2023-11-17 NOTE — Telephone Encounter (Signed)
 At her last visit I did tell her she needed an office visit so we could check her thyroid  level as that might explain the cause of weight gain.  Thyroid  levels can swing from hyperthyroidism to hypothyroidism with variations in weight when labs are abnormal.  The other option is for me to refer her to endocrinology for management of her hyperthyroidism.

## 2023-11-17 NOTE — Telephone Encounter (Signed)
 Copied from CRM [id number]. Topic: Clinical - Medication Question >> Nov 17, 2023  3:39 PM Sophia H wrote:  Reason for CRM: Spoke with patient who states about 2 weeks ago she was in the urgent care for covid. Since it has cleared up she is struggling with her bronchitis and wondering if anything can be sent in for that like an inhaler, her cough bothers her more at night. Please advise   albuterol  (VENTOLIN  HFA) 108 (90 Base) MCG/ACT inhaler  [facility]

## 2023-11-17 NOTE — Telephone Encounter (Signed)
 Patient states that she is gaining weight on her current medication and wants to know if medication can be changed.

## 2023-11-17 NOTE — Telephone Encounter (Signed)
 Copied from CRM [id number]. Topic: General - Other >> Nov 17, 2023  8:40 AM Chelsea Branch wrote:  Reason for CRM: patient called.. had questions on her thyroid  med methimazole  (TAPAZOLE ) 5 MG tablet.. concerns on weight gain?

## 2023-11-17 NOTE — Telephone Encounter (Unsigned)
 Copied from CRM [id number]. Topic: Clinical - Medication Refill >> Nov 17, 2023  2:51 PM Ivette P wrote: Medication: atorvastatin  (LIPITOR) 40 MG tablet  Has the patient contacted their pharmacy? Yes (Agent: If no, request that the patient contact the pharmacy for the refill. If patient does not wish to contact the pharmacy document the reason why and proceed with request.) (Agent: If yes, when and what did the pharmacy advise?)  This is the patient's preferred pharmacy:   [place] [facility] [address] [place] [address] Phone: [phone] Fax: [phone]  Is this the correct pharmacy for this prescription? Yes If no, delete pharmacy and type the correct one.   Has the prescription been filled recently? No, 04/28/2023  Is the patient out of the medication? Yes  Has the patient been seen for an appointment in the last year OR does the patient have an upcoming appointment? Yes  Can we respond through MyChart? No  Agent: Please be advised that Rx refills may take up to 3 business days. We ask that you follow-up with your pharmacy.

## 2023-11-17 NOTE — Telephone Encounter (Signed)
 Patient wants the referral to endocrinology.

## 2023-11-18 ENCOUNTER — Other Ambulatory Visit: Payer: Self-pay

## 2023-11-19 NOTE — Addendum Note (Signed)
 Addended by: Henrietta Cieslewicz on: 11/19/2023 08:44 AM   Modules accepted: Orders

## 2023-11-19 NOTE — Telephone Encounter (Signed)
 Referral has been placed.

## 2023-11-21 ENCOUNTER — Ambulatory Visit: Admitting: Physical Therapy

## 2023-11-21 VITALS — BP 139/85 | HR 68

## 2023-11-21 DIAGNOSIS — R2681 Unsteadiness on feet: Secondary | ICD-10-CM

## 2023-11-21 DIAGNOSIS — R269 Unspecified abnormalities of gait and mobility: Secondary | ICD-10-CM

## 2023-11-21 DIAGNOSIS — M6281 Muscle weakness (generalized): Secondary | ICD-10-CM | POA: Diagnosis not present

## 2023-11-21 NOTE — Therapy (Signed)
 OUTPATIENT PHYSICAL THERAPY NEURO TREATMENT   Patient Name: Chelsea Branch MRN: [id number] DOB:11-17-1965, 58 y.o., female Today's Date: 11/21/2023   PCP: Delbert Clam, MD REFERRING PROVIDER: Delbert Clam, MD  END OF SESSION:  PT End of Session - 11/21/23 0849     Visit Number 2    Number of Visits 9    Date for PT Re-Evaluation 12/22/23    Authorization Type [place] Medicaid   4 visits used   PT Start Time 0848    PT Stop Time 0929    PT Time Calculation (min) 41 min    Activity Tolerance Patient tolerated treatment well    Behavior During Therapy [facility] for tasks assessed/performed           Past Medical History:  Diagnosis Date   Abnormal thyroid  blood test 03/15/2017   Abnormal uterine bleeding    Alopecia    Anemia    Angina pectoris with normal coronary arteriogram (HCC) 2017   Had + Troponin c/w ? 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 iniatially ?2/2 peripartum in 1994 - improved by 2008 then worsening EF in 2011 back down to EF 25-30%. b. echo 01/21/14 showed mod LVH, [name]; 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   Peripartum cardiomyopathy 1994   Sleep apnea 2015   CPAP 12/2013   Stroke (HCC) 2011   Systolic and diastolic CHF, acute on chronic (HCC) 06/14/2015   Tobacco abuse    Type 2 diabetes mellitus (HCC) 04/28/2023   Past Surgical History:  Procedure Laterality Date   CARDIAC CATHETERIZATION N/A 06/16/2015   Procedure: Left Heart Cath and Coronary Angiography;   Surgeon: Candyce GORMAN Reek, MD;  Location: [facility] INVASIVE CV LAB;  Service: Cardiovascular;  Laterality: N/A;   CESAREAN SECTION  1992  1994   INTRAUTERINE DEVICE (IUD) INSERTION     about 3 years ago   LOOP RECORDER IMPLANT  ~ 2000   spine injections      [facility],  Dr Darlis   TIBIAL TUBERCLERPLASTY     TRANSESOPHAGEAL ECHOCARDIOGRAM (CATH LAB) N/A 04/20/2023   Procedure: TRANSESOPHAGEAL ECHOCARDIOGRAM;  Surgeon: Alvan Ronal BRAVO, MD;  Location: [facility] INVASIVE CV LAB;  Service: Cardiovascular;  Laterality: N/A;   TUBAL LIGATION  05/24/1992   Patient Active Problem List   Diagnosis Date Noted   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   History of stroke with residual effects 07/06/2023   Frequent falls 07/06/2023   Type 2 diabetes mellitus (HCC) 04/28/2023   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) 04/28/2023   History of cerebellar stroke 04/13/2023   History of stroke 04/11/2023   Liletta  IUD (intrauterine device) in place since 12/15/2015 04/06/2023   Chronic pain syndrome 12/28/2021   Primary osteoarthritis of both knees 12/28/2021   Spinal stenosis of lumbar region 07/21/2021   Lumbar radiculitis 07/21/2021   COPD (chronic obstructive pulmonary disease) (HCC) 07/21/2021   Mechanical complication due to intrauterine contraceptive device 07/21/2021   Arthropathy  of lumbar facet joint 03/14/2019   Degenerative disc disease at L5-S1 level 10/10/2018   Hyperthyroidism 04/26/2016   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   History of non-ST elevation myocardial infarction (NSTEMI) 06/14/2015   Homelessness 09/16/2014   Vitamin D  deficiency 01/29/2014   OSA (obstructive sleep apnea) 01/08/2014   Chronic combined systolic and diastolic CHF (congestive heart failure) (HCC) 08/14/2013   Essential hypertension    Fibroids 08/09/2012   Menorrhagia 05/08/2012   Female pattern hair loss    HLD (hyperlipidemia) 06/08/2010   CEREBRAL ANEURYSM  06/08/2010   History of cardiovascular disorder 06/08/2010    ONSET DATE: 10/24/2023 (referral)   REFERRING DIAG: I69.30 (ICD-10-CM) - History of stroke with residual effects  THERAPY DIAG:  Muscle weakness (generalized)  Abnormality of gait and mobility  Unsteadiness on feet  Rationale for Evaluation and Treatment: Rehabilitation  SUBJECTIVE:                                                                                                                                                                                             SUBJECTIVE STATEMENT: Presents w/rollator. Denies acute changes or pain. No falls. Has been working on walking as able.    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  PAIN:  Are you having pain? No  PRECAUTIONS: Fall  RED FLAGS: None   WEIGHT BEARING RESTRICTIONS: No  FALLS: Has patient fallen in last 6 months? Yes. Number of falls 4-5  LIVING ENVIRONMENT: Lives with: lives with their partner Lives in: House/apartment Stairs: No Has following equipment at home: Walker - 4 wheeled  PLOF: Requires assistive device for independence  PATIENT GOALS: to walk to get my mobility like I used to   OBJECTIVE:  Note: Objective measures were completed at Evaluation unless otherwise noted.  DIAGNOSTIC FINDINGS: MRI of brain from 03/2023   IMPRESSION: Acute infarcts in the right occipital lobe, along the posterior aspect of the left insula, and in the left cerebellar hemisphere with additional tiny foci of possible acute infarction in the left parietal lobe. Findings are suspicious for cardioembolic etiology. Trace petechial hemorrhage in the medial aspect of the  right occipital lobe and posterior left insula. No significant mass effect.  COGNITION: Overall cognitive status: Impaired   SENSATION: Pt denies numbness/tingling in BLEs   POSTURE: rounded shoulders, forward head, and increased thoracic kyphosis  LOWER EXTREMITY ROM:     Active  Right Eval Left Eval  Hip flexion    Hip extension    Hip abduction    Hip adduction    Hip internal rotation    Hip external rotation    Knee flexion    Knee extension    Ankle dorsiflexion    Ankle plantarflexion    Ankle inversion    Ankle eversion     (Blank rows = not tested)  LOWER EXTREMITY MMT:    MMT Right Eval Left Eval  Hip flexion    Hip extension    Hip abduction    Hip adduction    Hip internal rotation    Hip external rotation    Knee flexion    Knee extension    Ankle dorsiflexion    Ankle plantarflexion    Ankle inversion    Ankle eversion    (Blank rows = not tested)  BED MOBILITY:  Not tested  TRANSFERS: Sit to stand: Modified independence  Assistive device utilized: Environmental consultant - 4 wheeled     Stand to sit: Modified independence  Assistive device utilized: Environmental consultant - 4 wheeled     Pt demonstrates heavy reliance on BUEs to perform and pushes up on rollator rather than using armrests.   RAMP:  Not tested  CURB:  Not tested  STAIRS: Not tested GAIT: Gait pattern: step through pattern, decreased stride length, lateral hip instability, trunk flexed, poor foot clearance- Right, and poor foot clearance- Left Distance walked: Various clinic distances  Assistive device utilized: Walker - 4 wheeled Level of assistance: Modified independence Comments: Pt wearing slides this date which enables her to slide her feet on the floor. Pt reports she prefers this as it is easier and does not like wearing tennis shoes. Encouraged pt to wear closed-toe shoes as this is safer and will encouraged her to pick feet up.    VITALS  Vitals:   11/21/23 0853  BP: 139/85  Pulse:  68                                                                                                       TREATMENT:   Self-care/home management  Assessed vitals (see above) and WNL. Pt reports she did not take her medications this AM as her transportation picked her up too early and she did not have time to eat.   Ther Act  SciFit multi-peaks level 5.0 for 8 minutes using BUE/BLEs for neural priming for reciprocal movement, dynamic cardiovascular warmup and increased amplitude of stepping. RPE of 8/10 following activity  Reviewed HEP from previous POC (see bolded below):  Sit to stands w/no UE support, x10 reps. No instability noted w/movement.  At counter w/chair placed behind pt for safety:  Standing alt marches, x10 reps per side  Standing alt knee flexion, x10 reps per side  Standing hip abduction, x12 reps per side. Single instance of R knee buckling at end of activity  Provided pt w/new handout of HEP and added walking program to it (see below). Strongly emphasized  importance of placing chair behind pt when performing HEP at home and continued use of rollator at all times. Continue to inform pt that walking without AD is not a functional goal for her at this time due to complex medical history and increased fall risk without use of AD. Pt verbalized agreement and understanding.     PATIENT EDUCATION: Education details: HEP, walking program, importance of using rollator at all times  Person educated: Patient Education method: Explanation, Demonstration, Tactile cues, Verbal cues, and Handouts Education comprehension: verbalized understanding, returned demonstration, verbal cues required, tactile cues required, and needs further education  HOME EXERCISE PROGRAM: Access Code: [email or web address] URL: [email or web address] Date: 11/21/2023 Prepared by: Marlon Omar Orrego  Exercises - Sit to Stand Without Arm Support  - 1 x daily - 7 x weekly - 3 sets - 5-10 reps - Standing March  with Counter Support  - 1 x daily - 7 x weekly - 2 sets - 20 reps - Standing Knee Flexion with Counter Support  - 1 x daily - 7 x weekly - 2 sets - 10 reps - Standing Hip Abduction with Unilateral Counter Support  - 1 x daily - 7 x weekly - 2 sets - 12-15 reps  You Can Walk For A Certain Length Of Time Each Day                          Walk 15 minutes 1 time per day.             Increase 2  minutes every 7 days              Work up to 30 minutes (1-2 times per day).           GOALS: Goals reviewed with patient? Yes  STG = LTG DUE TO POC LENGTH   LONG TERM GOALS: Target date: 12/22/2023   Pt will improve 5 x STS to less than or equal to 20 seconds w/proper hand positioning to demonstrate improved functional strength and transfer efficiency.   Baseline: 29.47s w/BUE support on rollator  Goal status: INITIAL  2.  Pt will improve gait velocity to at least 2.5 ft/s w/LRAD for improved gait efficiency and independence  Baseline: 2.29 ft/s w/rollator  Goal status: INITIAL  3.  to be assessed and goal updated  Baseline:  Goal status: INITIAL  4.  Pt will be independent with final HEP for improved strength, balance, transfers and gait.  Baseline: to be reviewed  Goal status: INITIAL  ASSESSMENT:  CLINICAL IMPRESSION: Emphasis of skilled PT session on functional endurance, BLE strength and transfers. Pt very limited by fatigue and requires seated rest beaks in between exercises. Pt continues to request to walk without AD, so continue to educate pt on importance of energy conservation and history of falls without AD. Reviewed safe set up of HEP at home in which pt verbalized understanding. Continue POC.    OBJECTIVE IMPAIRMENTS: Abnormal gait, cardiopulmonary status limiting activity, decreased activity tolerance, decreased balance, decreased cognition, decreased endurance, decreased knowledge of use of DME, decreased mobility, decreased strength, decreased safety awareness,  impaired perceived functional ability, impaired vision/preception, improper body mechanics, and pain  ACTIVITY LIMITATIONS: carrying, lifting, squatting, stairs, transfers, hygiene/grooming, locomotion level, and caring for others  PARTICIPATION LIMITATIONS: meal prep, cleaning, interpersonal relationship, driving, shopping, community activity, occupation, and yard work  PERSONAL FACTORS: Age, Fitness, Past/current experiences, Transportation, and 1 comorbidity: history of CVA are also affecting patient's functional  outcome.   REHAB POTENTIAL: Good  CLINICAL DECISION MAKING: Evolving/moderate complexity  EVALUATION COMPLEXITY: Moderate  PLAN:  PT FREQUENCY: 1-2x/week  PT DURATION: 4 weeks  PLANNED INTERVENTIONS: 97164- PT Re-evaluation, 97750- Physical Performance Testing, 97110-Therapeutic exercises, 97530- Therapeutic activity, [id number]- Neuromuscular re-education, 97535- Self Care, [id number]- Manual therapy, [id number]- Gait training, [phone]- Aquatic Therapy, [phone]- Electrical stimulation (manual), [phone] (1-2 muscles), [id number] (3+ muscles)- Dry Needling, Patient/Family education, Balance training, Stair training, Joint mobilization, Spinal mobilization, Vestibular training, and DME instructions  PLAN FOR NEXT SESSION: and update goal. Add to HEP as appropriate for functional BLE strength and endurance. Work on BLE strength, reaching tasks and endurance   Check all possible CPT codes: See Planned Interventions List for Planned CPT Codes    Check all conditions that are expected to impact treatment: Social determinants of health   If treatment provided at initial evaluation, no treatment charged due to lack of authorization.     Vandell Kun E Farzana Koci, PT, DPT 11/21/2023, 11:09 AM

## 2023-11-21 NOTE — Telephone Encounter (Signed)
 Patient has been informed.

## 2023-11-22 ENCOUNTER — Ambulatory Visit: Admitting: Cardiology

## 2023-11-24 ENCOUNTER — Ambulatory Visit: Attending: Critical Care Medicine | Admitting: Physical Therapy

## 2023-11-24 VITALS — BP 135/82 | HR 73

## 2023-11-24 DIAGNOSIS — R2689 Other abnormalities of gait and mobility: Secondary | ICD-10-CM | POA: Insufficient documentation

## 2023-11-24 DIAGNOSIS — R2681 Unsteadiness on feet: Secondary | ICD-10-CM | POA: Insufficient documentation

## 2023-11-24 DIAGNOSIS — R41842 Visuospatial deficit: Secondary | ICD-10-CM | POA: Insufficient documentation

## 2023-11-24 DIAGNOSIS — R208 Other disturbances of skin sensation: Secondary | ICD-10-CM | POA: Diagnosis present

## 2023-11-24 DIAGNOSIS — R471 Dysarthria and anarthria: Secondary | ICD-10-CM | POA: Diagnosis present

## 2023-11-24 DIAGNOSIS — R278 Other lack of coordination: Secondary | ICD-10-CM | POA: Diagnosis present

## 2023-11-24 DIAGNOSIS — R4701 Aphasia: Secondary | ICD-10-CM | POA: Diagnosis present

## 2023-11-24 DIAGNOSIS — M6281 Muscle weakness (generalized): Secondary | ICD-10-CM | POA: Diagnosis present

## 2023-11-24 DIAGNOSIS — R29818 Other symptoms and signs involving the nervous system: Secondary | ICD-10-CM | POA: Insufficient documentation

## 2023-11-24 DIAGNOSIS — R29898 Other symptoms and signs involving the musculoskeletal system: Secondary | ICD-10-CM | POA: Diagnosis present

## 2023-11-24 NOTE — Therapy (Signed)
 OUTPATIENT PHYSICAL THERAPY NEURO TREATMENT   Patient Name: Chelsea Branch MRN: [id number] DOB:1966/01/08, 58 y.o., female Today's Date: 11/24/2023   PCP: Delbert Clam, MD REFERRING PROVIDER: Delbert Clam, MD  END OF SESSION:  PT End of Session - 11/24/23 0845     Visit Number 3    Number of Visits 9    Date for PT Re-Evaluation 12/22/23    Authorization Type [place] Medicaid   4 visits used   PT Start Time 0844    PT Stop Time 0927    PT Time Calculation (min) 43 min    Activity Tolerance Patient tolerated treatment well    Behavior During Therapy [facility] for tasks assessed/performed           Past Medical History:  Diagnosis Date   Abnormal thyroid  blood test 03/15/2017   Abnormal uterine bleeding    Alopecia    Anemia    Angina pectoris with normal coronary arteriogram (HCC) 2017   Had + Troponin c/w ? 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 iniatially ?2/2 peripartum in 1994 - improved by 2008 then worsening EF in 2011 back down to EF 25-30%. b. echo 01/21/14 showed mod LVH, [name]; 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   Peripartum cardiomyopathy 1994   Sleep apnea 2015   CPAP 12/2013   Stroke (HCC) 2011   Systolic and diastolic CHF, acute on chronic (HCC) 06/14/2015   Tobacco abuse    Type 2 diabetes mellitus (HCC) 04/28/2023   Past Surgical History:  Procedure Laterality Date   CARDIAC CATHETERIZATION N/A 06/16/2015   Procedure: Left Heart Cath and Coronary Angiography;   Surgeon: Candyce GORMAN Reek, MD;  Location: [facility] INVASIVE CV LAB;  Service: Cardiovascular;  Laterality: N/A;   CESAREAN SECTION  1992  1994   INTRAUTERINE DEVICE (IUD) INSERTION     about 3 years ago   LOOP RECORDER IMPLANT  ~ 2000   spine injections      [facility],  Dr Darlis   TIBIAL TUBERCLERPLASTY     TRANSESOPHAGEAL ECHOCARDIOGRAM (CATH LAB) N/A 04/20/2023   Procedure: TRANSESOPHAGEAL ECHOCARDIOGRAM;  Surgeon: Alvan Ronal BRAVO, MD;  Location: [facility] INVASIVE CV LAB;  Service: Cardiovascular;  Laterality: N/A;   TUBAL LIGATION  05/24/1992   Patient Active Problem List   Diagnosis Date Noted   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   History of stroke with residual effects 07/06/2023   Frequent falls 07/06/2023   Type 2 diabetes mellitus (HCC) 04/28/2023   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) 04/28/2023   History of cerebellar stroke 04/13/2023   History of stroke 04/11/2023   Liletta  IUD (intrauterine device) in place since 12/15/2015 04/06/2023   Chronic pain syndrome 12/28/2021   Primary osteoarthritis of both knees 12/28/2021   Spinal stenosis of lumbar region 07/21/2021   Lumbar radiculitis 07/21/2021   COPD (chronic obstructive pulmonary disease) (HCC) 07/21/2021   Mechanical complication due to intrauterine contraceptive device 07/21/2021   Arthropathy  of lumbar facet joint 03/14/2019   Degenerative disc disease at L5-S1 level 10/10/2018   Hyperthyroidism 04/26/2016   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   History of non-ST elevation myocardial infarction (NSTEMI) 06/14/2015   Homelessness 09/16/2014   Vitamin D  deficiency 01/29/2014   OSA (obstructive sleep apnea) 01/08/2014   Chronic combined systolic and diastolic CHF (congestive heart failure) (HCC) 08/14/2013   Essential hypertension    Fibroids 08/09/2012   Menorrhagia 05/08/2012   Female pattern hair loss    HLD (hyperlipidemia) 06/08/2010   CEREBRAL ANEURYSM  06/08/2010   History of cardiovascular disorder 06/08/2010    ONSET DATE: 10/24/2023 (referral)   REFERRING DIAG: I69.30 (ICD-10-CM) - History of stroke with residual effects  THERAPY DIAG:  Muscle weakness (generalized)  Unsteadiness on feet  Other abnormalities of gait and mobility  Rationale for Evaluation and Treatment: Rehabilitation  SUBJECTIVE:                                                                                                                                                                                             SUBJECTIVE STATEMENT: Presents w/rollator. States she is doing well, has been walking about 15 minutes daily with her rollator. Does have to take seated rest breaks. No falls or pain to report   Pt accompanied by: self  PERTINENT HISTORY: NSTEMI in 2017,  NICM (EF 34%, LV global hypokinesis, LVH, dilated LV, grade 1 DD, mild to moderate MR from echo of 05/2023), hypertension, OSA, right occipital and left cerebellar hemispheric CVA status post TNK therapy with residual dysarthria, hyperthyroidism, type 2 diabetes mellitus.  PAIN:  Are you having pain? No  PRECAUTIONS: Fall  RED FLAGS: None   WEIGHT BEARING RESTRICTIONS: No  FALLS: Has patient fallen in last 6 months? Yes. Number of falls 4-5  LIVING ENVIRONMENT: Lives with: lives with their partner Lives in: House/apartment Stairs: No Has following equipment at home: Walker - 4 wheeled  PLOF: Requires assistive device for independence  PATIENT GOALS: to walk to get my mobility like I used to   OBJECTIVE:  Note: Objective measures were completed at Evaluation unless otherwise noted.  DIAGNOSTIC FINDINGS: MRI of brain from 03/2023   IMPRESSION: Acute infarcts in the right occipital lobe, along the posterior aspect of the left insula, and in the left cerebellar hemisphere with additional tiny foci of possible acute infarction in the left parietal lobe. Findings are suspicious for  cardioembolic etiology. Trace petechial hemorrhage in the medial aspect of the right occipital lobe and posterior left insula. No significant mass effect.  COGNITION: Overall cognitive status: Impaired   SENSATION: Pt denies numbness/tingling  in BLEs   POSTURE: rounded shoulders, forward head, and increased thoracic kyphosis  LOWER EXTREMITY ROM:     Active  Right Eval Left Eval  Hip flexion    Hip extension    Hip abduction    Hip adduction    Hip internal rotation    Hip external rotation    Knee flexion    Knee extension    Ankle dorsiflexion    Ankle plantarflexion    Ankle inversion    Ankle eversion     (Blank rows = not tested)  LOWER EXTREMITY MMT:    MMT Right Eval Left Eval  Hip flexion    Hip extension    Hip abduction    Hip adduction    Hip internal rotation    Hip external rotation    Knee flexion    Knee extension    Ankle dorsiflexion    Ankle plantarflexion    Ankle inversion    Ankle eversion    (Blank rows = not tested)  BED MOBILITY:  Not tested  TRANSFERS: Sit to stand: Modified independence  Assistive device utilized: Environmental consultant - 4 wheeled     Stand to sit: Modified independence  Assistive device utilized: Environmental consultant - 4 wheeled     Pt demonstrates heavy reliance on BUEs to perform and pushes up on rollator rather than using armrests.   RAMP:  Not tested  CURB:  Not tested  STAIRS: Not tested GAIT: Gait pattern: step through pattern, decreased stride length, lateral hip instability, trunk flexed, poor foot clearance- Right, and poor foot clearance- Left Distance walked: Various clinic distances  Assistive device utilized: Walker - 4 wheeled Level of assistance: Modified independence Comments: Pt wearing slides this date which enables her to slide her feet on the floor. Pt reports she prefers this as it is easier and does not like wearing tennis shoes. Encouraged pt to wear closed-toe shoes as this is safer and will encouraged her  to pick feet up.    VITALS  Vitals:   11/24/23 0848  BP: 135/82  Pulse: 73                                                                                                        TREATMENT:   Self-care/home management  Assessed vitals (see above) and WNL.   Ther Act  SciFit multi-peaks level 6.5 for 8 minutes using BUE/BLEs for neural priming for reciprocal movement, dynamic cardiovascular warmup and increased amplitude of stepping. RPE of 8/10 following activity    Gait pattern: step through pattern, decreased stride length, and trunk flexed Distance walked: 115' loop completed 7x = 805'  Assistive device utilized: Walker - 4 wheeled Level of assistance: Modified independence Comments: RPE of 9/10. Pt progressively slowed down each lap. No instability noted   Seated march overs using 10# KB, x15 reps per side, for improved hip flexor strength and core stability. Educated pt on how to perform at home (using pasta box, can of soup, lotion bottle, etc.). Min cues to step over the obstacle rather  than around it. Added to HEP (see bolded below)     PATIENT EDUCATION: Education details: Updates to HEP, continue walking program Person educated: Patient Education method: Explanation, Demonstration, Tactile cues, Verbal cues, and Handouts Education comprehension: verbalized understanding, returned demonstration, verbal cues required, tactile cues required, and needs further education  HOME EXERCISE PROGRAM: Access Code: [email or web address] URL: [email or web address] Date: 11/21/2023 Prepared by: Marlon Nary Sneed  Exercises - Sit to Stand Without Arm Support  - 1 x daily - 7 x weekly - 3 sets - 5-10 reps - Standing March with Counter Support  - 1 x daily - 7 x weekly - 2 sets - 20 reps - Standing Knee Flexion with Counter Support  - 1 x daily - 7 x weekly - 2 sets - 10 reps - Standing Hip Abduction with Unilateral Counter Support  - 1 x daily - 7 x weekly - 2 sets - 12-15  reps - Seated march over  - 1 x daily - 7 x weekly - 1-2 sets - 10-15 reps  You Can Walk For A Certain Length Of Time Each Day                          Walk 15 minutes 1 time per day.             Increase 2  minutes every 7 days              Work up to 30 minutes (1-2 times per day).           GOALS: Goals reviewed with patient? Yes  STG = LTG DUE TO POC LENGTH   LONG TERM GOALS: Target date: 12/22/2023   Pt will improve 5 x STS to less than or equal to 20 seconds w/proper hand positioning to demonstrate improved functional strength and transfer efficiency.   Baseline: 29.47s w/BUE support on rollator  Goal status: INITIAL  2.  Pt will improve gait velocity to at least 2.5 ft/s w/LRAD for improved gait efficiency and independence  Baseline: 2.29 ft/s w/rollator  Goal status: INITIAL  3.  Pt will ambulate greater than or equal to 1000 feet on with LRAD mod I for improved cardiovascular endurance and BLE strength.   Baseline: 805' w/rollator  Goal status: REVISED  4.  Pt will be independent with final HEP for improved strength, balance, transfers and gait.  Baseline: to be reviewed  Goal status: INITIAL  ASSESSMENT:  CLINICAL IMPRESSION: Emphasis of skilled PT session on assessing endurance via , functional hip strength and core stability. Pt ambulated 805' on w/RPE of 9/10 reported, which is ~900' below age-adjusted norms and indicative of impaired endurance. Pt has been working on walking at home w/rollator but does require occasional seated rest breaks. Continue POC.    OBJECTIVE IMPAIRMENTS: Abnormal gait, cardiopulmonary status limiting activity, decreased activity tolerance, decreased balance, decreased cognition, decreased endurance, decreased knowledge of use of DME, decreased mobility, decreased strength, decreased safety awareness, impaired perceived functional ability, impaired vision/preception, improper body mechanics, and pain  ACTIVITY  LIMITATIONS: carrying, lifting, squatting, stairs, transfers, hygiene/grooming, locomotion level, and caring for others  PARTICIPATION LIMITATIONS: meal prep, cleaning, interpersonal relationship, driving, shopping, community activity, occupation, and yard work  PERSONAL FACTORS: Age, Fitness, Past/current experiences, Transportation, and 1 comorbidity: history of CVA are also affecting patient's functional outcome.   REHAB POTENTIAL: Good  CLINICAL DECISION MAKING: Evolving/moderate complexity  EVALUATION COMPLEXITY: Moderate  PLAN:  PT FREQUENCY: 1-2x/week  PT DURATION: 4 weeks  PLANNED INTERVENTIONS: 97164- PT Re-evaluation, 97750- Physical Performance Testing, 97110-Therapeutic exercises, 97530- Therapeutic activity, [id number]- Neuromuscular re-education, 97535- Self Care, [id number]- Manual therapy, [phone]- Gait training, [phone]- Aquatic Therapy, [phone]- Electrical stimulation (manual), [phone] (1-2 muscles), [phone] (3+ muscles)- Dry Needling, Patient/Family education, Balance training, Stair training, Joint mobilization, Spinal mobilization, Vestibular training, and DME instructions  PLAN FOR NEXT SESSION: Add to HEP as appropriate for functional BLE strength and endurance. Work on BLE strength, reaching tasks and endurance, core stability   Check all possible CPT codes: See Planned Interventions List for Planned CPT Codes    Check all conditions that are expected to impact treatment: Social determinants of health   If treatment provided at initial evaluation, no treatment charged due to lack of authorization.     Hjalmer Iovino E Emmanuella Mirante, PT, DPT 11/24/2023, 9:27 AM

## 2023-11-28 ENCOUNTER — Ambulatory Visit (HOSPITAL_COMMUNITY)
Admission: EM | Admit: 2023-11-28 | Discharge: 2023-11-28 | Disposition: A | Discharge status: Home / Self Care | Attending: Physician Assistant | Admitting: Physician Assistant

## 2023-11-28 ENCOUNTER — Ambulatory Visit (HOSPITAL_COMMUNITY): Payer: Self-pay | Admitting: Physician Assistant

## 2023-11-28 ENCOUNTER — Ambulatory Visit: Attending: Critical Care Medicine | Admitting: Physical Therapy

## 2023-11-28 ENCOUNTER — Other Ambulatory Visit: Payer: Self-pay

## 2023-11-28 ENCOUNTER — Ambulatory Visit (INDEPENDENT_AMBULATORY_CARE_PROVIDER_SITE_OTHER)

## 2023-11-28 ENCOUNTER — Telehealth: Payer: Self-pay | Admitting: Physical Therapy

## 2023-11-28 ENCOUNTER — Encounter (HOSPITAL_COMMUNITY): Payer: Self-pay | Admitting: Emergency Medicine

## 2023-11-28 ENCOUNTER — Ambulatory Visit: Payer: Self-pay

## 2023-11-28 ENCOUNTER — Telehealth: Payer: Self-pay | Admitting: Family Medicine

## 2023-11-28 DIAGNOSIS — J441 Chronic obstructive pulmonary disease with (acute) exacerbation: Secondary | ICD-10-CM

## 2023-11-28 DIAGNOSIS — R051 Acute cough: Secondary | ICD-10-CM | POA: Diagnosis not present

## 2023-11-28 DIAGNOSIS — R059 Cough, unspecified: Secondary | ICD-10-CM | POA: Diagnosis not present

## 2023-11-28 MED ORDER — BENZONATATE 100 MG PO CAPS
100.0000 mg | ORAL_CAPSULE | Freq: Three times a day (TID) | ORAL | 0 refills | Status: DC
  Filled 2023-11-28 – 2023-11-29 (×3): qty 21, 7d supply, fill #0

## 2023-11-28 MED ORDER — AEROCHAMBER PLUS FLO-VU MEDIUM MISC
1.0000 | Freq: Once | Status: AC
  Administered 2023-11-28: 1

## 2023-11-28 MED ORDER — AEROCHAMBER PLUS FLO-VU LARGE MISC
Status: AC
  Filled 2023-11-28: qty 1

## 2023-11-28 MED ORDER — ALBUTEROL SULFATE HFA 108 (90 BASE) MCG/ACT IN AERS
INHALATION_SPRAY | RESPIRATORY_TRACT | Status: AC
  Filled 2023-11-28: qty 6.7

## 2023-11-28 MED ORDER — AMOXICILLIN-POT CLAVULANATE 875-125 MG PO TABS
1.0000 | ORAL_TABLET | Freq: Two times a day (BID) | ORAL | 0 refills | Status: DC
  Filled 2023-11-28 – 2023-11-29 (×3): qty 14, 7d supply, fill #0

## 2023-11-28 MED ORDER — PREDNISONE 20 MG PO TABS
20.0000 mg | ORAL_TABLET | Freq: Every day | ORAL | 0 refills | Status: AC
  Filled 2023-11-28 – 2023-11-29 (×3): qty 5, 5d supply, fill #0

## 2023-11-28 MED ORDER — ALBUTEROL SULFATE HFA 108 (90 BASE) MCG/ACT IN AERS
2.0000 | INHALATION_SPRAY | Freq: Once | RESPIRATORY_TRACT | Status: AC
  Administered 2023-11-28: 2 via RESPIRATORY_TRACT

## 2023-11-28 NOTE — ED Provider Notes (Signed)
 [facility]    CSN: [id number] Arrival date & time: 11/28/23  1718      History   Chief Complaint Chief Complaint  Patient presents with   Cough    HPI Chelsea Branch is a 58 y.o. female.   Patient presents today with a week and a half long history of acute cough.  She reports that cough is productive with purulent but clear sputum.  She reports some associated shortness of breath.  Denies any fever, congestion, nausea, vomiting.  She does have a history of COPD and has been using her albuterol  inhaler with temporary improvement of symptoms.  Denies hospitalization related to asthma or COPD.  She denies any recent antibiotics or steroids.  She does have a history of heart failure but denies any increased sodium consumption, increased weight, leg swelling.  She has been taking Mucinex  over-the-counter without improvement of symptoms.  This has been ineffective in managing her symptoms.    Past Medical History:  Diagnosis Date   Abnormal thyroid  blood test 03/15/2017   Abnormal uterine bleeding    Alopecia    Anemia    Angina pectoris with normal coronary arteriogram (HCC) 2017   Had + Troponin c/w ? 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 iniatially ?2/2 peripartum in 1994 - improved by 2008 then worsening EF in 2011 back down to EF 25-30%. b. echo 01/21/14 showed mod LVH, [name]; 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   Peripartum cardiomyopathy 1994   Sleep apnea 2015   CPAP 12/2013    Stroke (HCC) 2011   Systolic and diastolic CHF, acute on chronic (HCC) 06/14/2015   Tobacco abuse    Type 2 diabetes mellitus (HCC) 04/28/2023    Patient Active Problem List   Diagnosis Date Noted   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   History of stroke with residual effects 07/06/2023   Frequent falls 07/06/2023   Type 2 diabetes mellitus (HCC) 04/28/2023   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) 04/28/2023   History of cerebellar stroke 04/13/2023   History of stroke 04/11/2023   Liletta  IUD (intrauterine device) in place since 12/15/2015 04/06/2023   Chronic pain syndrome 12/28/2021   Primary osteoarthritis of both knees 12/28/2021   Spinal stenosis of lumbar region 07/21/2021   Lumbar radiculitis 07/21/2021   COPD (chronic obstructive pulmonary disease) (HCC) 07/21/2021   Mechanical complication due to intrauterine contraceptive device 07/21/2021   Arthropathy of lumbar facet joint 03/14/2019   Degenerative disc disease at L5-S1 level 10/10/2018   Hyperthyroidism 04/26/2016   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   History of non-ST elevation myocardial infarction (NSTEMI) 06/14/2015   Homelessness 09/16/2014   Vitamin D  deficiency 01/29/2014   OSA (obstructive sleep apnea) 01/08/2014   Chronic combined systolic and diastolic CHF (congestive heart failure) (HCC) 08/14/2013   Essential hypertension    Fibroids 08/09/2012   Menorrhagia 05/08/2012   Female pattern hair loss    HLD (hyperlipidemia) 06/08/2010  CEREBRAL ANEURYSM 06/08/2010   History of cardiovascular disorder 06/08/2010    Past Surgical History:  Procedure Laterality Date   CARDIAC CATHETERIZATION N/A 06/16/2015   Procedure: Left Heart Cath and Coronary Angiography;  Surgeon: Candyce GORMAN Reek, MD;  Location: [facility] INVASIVE CV LAB;  Service: Cardiovascular;  Laterality: N/A;   CESAREAN SECTION  1992  1994   INTRAUTERINE DEVICE (IUD) INSERTION     about 3 years ago    LOOP RECORDER IMPLANT  ~ 2000   spine injections      [facility],  Dr Darlis   TIBIAL TUBERCLERPLASTY     TRANSESOPHAGEAL ECHOCARDIOGRAM (CATH LAB) N/A 04/20/2023   Procedure: TRANSESOPHAGEAL ECHOCARDIOGRAM;  Surgeon: Alvan Ronal BRAVO, MD;  Location: [facility] INVASIVE CV LAB;  Service: Cardiovascular;  Laterality: N/A;   TUBAL LIGATION  05/24/1992    OB History     Gravida  2   Para  2   Term  2   Preterm      AB      Living  3      SAB      IAB      Ectopic      Multiple  1   Live Births  3            Home Medications    Prior to Admission medications   Medication Sig Start Date End Date Taking? Authorizing Provider  amoxicillin -clavulanate (AUGMENTIN ) 875-125 MG tablet Take 1 tablet by mouth every 12 (twelve) hours. 11/28/23  Yes Ryonna Cimini K, PA-C  benzonatate  (TESSALON ) 100 MG capsule Take 1 capsule (100 mg total) by mouth every 8 (eight) hours. 11/28/23  Yes Jazlen Ogarro K, PA-C  predniSONE  (DELTASONE ) 20 MG tablet Take 1 tablet (20 mg total) by mouth daily for 5 days. 11/28/23 12/03/23 Yes Aubriella Perezgarcia K, PA-C  acetaminophen  (TYLENOL ) 500 MG tablet Take 1,000 mg by mouth as needed for mild pain (pain score 1-3) or moderate pain (pain score 4-6).    [provider]  albuterol  (VENTOLIN  HFA) 108 (90 Base) MCG/ACT inhaler Inhale 2 puffs into the lungs every 4 (four) hours as needed for wheezing or shortness of breath. 11/17/23   Delbert Clam, MD  aspirin  81 MG chewable tablet Chew 1 tablet (81 mg total) by mouth daily. 04/14/23   Lenard Calin, MD  atorvastatin  (LIPITOR) 40 MG tablet Take 1 tablet (40 mg total) by mouth daily. 11/17/23   Newlin, Enobong, MD  FLUoxetine  (PROZAC ) 20 MG capsule Take 1 capsule (20 mg total) by mouth daily. 11/03/23   Newlin, Enobong, MD  fluticasone  (FLONASE ) 50 MCG/ACT nasal spray Place 2 sprays into both nostrils daily. 04/10/23   Rising, Asberry, PA-C  hydrOXYzine  (ATARAX ) 25 MG tablet Take 1 tablet (25 mg total)  by mouth at bedtime as needed. 11/03/23   Newlin, Enobong, MD  ketoconazole  (NIZORAL ) 2 % shampoo Apply 1 Application topically 2 (two) times a week. 08/18/23   Fleming, Zelda W, NP  Melatonin 10 MG TABS Take 10 mg by mouth at bedtime. 08/17/23   Fleming, Zelda W, NP  methimazole  (TAPAZOLE ) 5 MG tablet Take 1 tablet (5 mg total) by mouth 2 (two) times daily. 09/14/23   Newlin, Enobong, MD  metoprolol  succinate (TOPROL -XL) 50 MG 24 hr tablet Take 1.5 tablets (75 mg total) by mouth daily. Take with or immediately following a meal. 08/26/23   Thukkani, Arun K, MD  Misc. Devices MISC Blood Pressure Monitor 10/20/23   Delbert Clam, MD  Polyethylene Glycol  4500 POWD 17 g by Does not apply route daily at 6 (six) AM. Dissolve 1 capful of powder in one 8 ounce glass of water. Repeat daily or as needed to soften stool Patient taking differently: 17 g by Does not apply route daily as needed (constipation). 04/10/23   Rising, Asberry, PA-C  potassium chloride  SA (KLOR-CON  M20) 20 MEQ tablet Take 2 tablets (40 mEq total) by mouth daily. 10/18/23   Newlin, Enobong, MD  sacubitril -valsartan  (ENTRESTO ) 49-51 MG Take 1 tablet by mouth 2 (two) times daily. 08/26/23   Thukkani, Arun K, MD  spironolactone  (ALDACTONE ) 25 MG tablet Take 1 tablet (25 mg total) by mouth daily. 08/26/23   Thukkani, Arun K, MD  torsemide  (DEMADEX ) 20 MG tablet Take 1 tablet (20 mg total) by mouth daily. 04/28/23   Newlin, Enobong, MD  triamcinolone  cream (KENALOG ) 0.1 % Apply 1 Application topically 2 (two) times daily. 10/07/23   Delbert Clam, MD    Family History Family History  Problem Relation Age of Onset   Healthy Mother    Stroke Sister    Hypertension Sister    Cancer Maternal Grandmother        uterine   Other Neg Hx    Heart disease Neg Hx     Social History Social History   Tobacco Use   Smoking status: Former    Current packs/day: 0.00    Average packs/day: 0.1 packs/day for 30.0 years (3.0 ttl pk-yrs)    Types:  Cigarettes    Start date: 06/02/1985    Quit date: 06/03/2015    Years since quitting: 8.4   Smokeless tobacco: Never   Tobacco comments:    Pt. stated she stopped smoking a year ago. 09/29/2018  Vaping Use   Vaping status: Never Used  Substance Use Topics   Alcohol use: No    Alcohol/week: 0.0 standard drinks of alcohol   Drug use: No     Allergies   Ace inhibitors and Jardiance  [empagliflozin ]   Review of Systems Review of Systems  Constitutional:  Positive for activity change. Negative for appetite change, fatigue and fever.  HENT:  Negative for congestion and sore throat.   Respiratory:  Positive for cough and shortness of breath. Negative for chest tightness and wheezing.   Cardiovascular:  Negative for chest pain.  Gastrointestinal:  Negative for abdominal pain, diarrhea, nausea and vomiting.     Physical Exam Triage Vital Signs ED Triage Vitals  Encounter Vitals Group     BP 11/28/23 1829 118/87     Girls Systolic BP Percentile --      Girls Diastolic BP Percentile --      Boys Systolic BP Percentile --      Boys Diastolic BP Percentile --      Pulse Rate 11/28/23 1829 74     Resp 11/28/23 1829 20     Temp 11/28/23 1829 98.5 F (36.9 C)     Temp Source 11/28/23 1829 Oral     SpO2 11/28/23 1829 96 %     Weight --      Height --      Head Circumference --      Peak Flow --      Pain Score 11/28/23 1826 0     Pain Loc --      Pain Education --      Exclude from Growth Chart --    No data found.  Updated Vital Signs BP 118/87 (BP Location: Right Arm) Comment: large  cuff  Pulse 74   Temp 98.5 F (36.9 C) (Oral)   Resp 20   Wt 203 lb 3.2 oz (92.2 kg)   SpO2 96%   BMI 30.90 kg/m   Visual Acuity Right Eye Distance:   Left Eye Distance:   Bilateral Distance:    Right Eye Near:   Left Eye Near:    Bilateral Near:     Physical Exam Vitals reviewed.  Constitutional:      General: She is awake. She is not in acute distress.    Appearance: Normal  appearance. She is well-developed. She is not ill-appearing.     Comments: Very pleasant female appears stated age in no acute distress sitting comfortably in exam room  HENT:     Head: Normocephalic and atraumatic.     Right Ear: Tympanic membrane, ear canal and external ear normal. Tympanic membrane is not erythematous or bulging.     Left Ear: Tympanic membrane, ear canal and external ear normal. Tympanic membrane is not erythematous or bulging.     Nose:     Right Sinus: No maxillary sinus tenderness or frontal sinus tenderness.     Left Sinus: No maxillary sinus tenderness or frontal sinus tenderness.     Mouth/Throat:     Pharynx: Uvula midline. No oropharyngeal exudate or posterior oropharyngeal erythema.  Cardiovascular:     Rate and Rhythm: Normal rate and regular rhythm.     Heart sounds: Normal heart sounds, S1 normal and S2 normal. No murmur heard. Pulmonary:     Effort: Pulmonary effort is normal.     Breath sounds: Examination of the right-lower field reveals decreased breath sounds. Examination of the left-lower field reveals decreased breath sounds. Decreased breath sounds present. No wheezing, rhonchi or rales.  Musculoskeletal:     Right lower leg: No edema.     Left lower leg: No edema.  Psychiatric:        Behavior: Behavior is cooperative.      UC Treatments / Results  Labs (all labs ordered are listed, but only abnormal results are displayed) Labs Reviewed - No data to display  EKG   Radiology DG Chest 2 View Result Date: 11/28/2023 CLINICAL DATA:  Worsening cough EXAM: CHEST - 2 VIEW COMPARISON:  Chest radiograph February 13, 2023 FINDINGS: The heart size and mediastinal contours are within normal limits. Both lungs are clear. No suspicious consolidation or new pulmonary nodule. No pleural effusion or pneumothorax. The visualized skeletal structures are unremarkable. Multilevel mild compression deformity of the thoracic spine, increased to prior. IMPRESSION:  No active cardiopulmonary disease. Multilevel mild compression deformity of the thoracic spine, increased to prior. Electronically Signed   By: Megan  Zare M.D.   On: 11/28/2023 19:45    Procedures Procedures (including critical care time)  Medications Ordered in UC Medications  albuterol  (VENTOLIN  HFA) 108 (90 Base) MCG/ACT inhaler 2 puff (2 puffs Inhalation Given 11/28/23 1856)  AeroChamber Plus Flo-Vu Medium MISC 1 each (1 each Other Given 11/28/23 1856)    Initial Impression / Assessment and Plan / UC Course  I have reviewed the triage vital signs and the nursing notes.  Pertinent labs & imaging results that were available during my care of the patient were reviewed by me and considered in my medical decision making (see chart for details).     Patient is well-appearing, afebrile, nontoxic, nontachycardic.  Viral testing was deferred as patient has been symptomatic for over a week and this would not change management.  Given increased production and viscosity concern for COPD exacerbation.  Chest x-ray was obtained that showed no acute cardiopulmonary disease based on my primary read.  At the time of discharge we will waiting for radiologist overread and we will contact her if this differs and change our treatment plan.  Will cover for COPD exacerbation with Augmentin  twice daily for 7 days.  No indication for dose adjustment based on metabolic panel from 07/06/2023 with creatinine of 0.73 and calculated creatinine clearance of 122.11 mL/min.  She does have a history of diabetes but this is well-controlled with A1c of 6.7% on 10/05/2023.  Will use a low-dose (20 mg) prednisone  for 5 days to help with symptoms.  We discussed that she is not to take NSAIDs with this medication due to risk of GI bleeding.  She was given albuterol  in clinic and encouraged to use this every 4-6 hours as needed.  Low suspicion for CHF exacerbation as she denies any sudden weight gain and reports that she is at her dry  weight and has no significant peripheral edema on exam.  She was given Tessalon  for cough.  Recommend over-the-counter medication including Mucinex , Flonase , Tylenol .  She is to follow-up with her primary care within a few days if her symptoms have not resolved.  We discussed that if anything worsens and she has worsening cough, fever, nausea/vomiting interfering with oral intake, weakness, sudden weight gain, leg swelling she needs to be seen emergently.  Strict return precautions given.  Excuse note provided.  Final Clinical Impressions(s) / UC Diagnoses   Final diagnoses:  Acute cough  COPD exacerbation [facility])     Discharge Instructions      We are treating you for a bronchitis/emphysema exacerbation.  Start Augmentin  twice daily for 7 days.  Take prednisone  20 mg for 5 days.  Do not take NSAIDs with this medication including aspirin , ibuprofen /Advil , naproxen /Aleve .  Use Tessalon  for cough.  Continue over-the-counter medication such as Mucinex , Tylenol , Flonase , nasal saline/sinus rinses.  Make sure you rest and drink plenty of fluid.  Use your albuterol  inhaler every 4-6 hours as needed.  Follow-up with your primary care in a few days if your symptoms have not resolved.  If anything worsens you have worsening cough, shortness of breath, fever, nausea/vomiting, sudden weight gain, swelling in your legs you need to be seen immediately.     ED Prescriptions     Medication Sig Dispense Auth. Provider   amoxicillin -clavulanate (AUGMENTIN ) 875-125 MG tablet Take 1 tablet by mouth every 12 (twelve) hours. 14 tablet Raynald Rouillard K, PA-C   predniSONE  (DELTASONE ) 20 MG tablet Take 1 tablet (20 mg total) by mouth daily for 5 days. 5 tablet Montae Stager K, PA-C   benzonatate  (TESSALON ) 100 MG capsule Take 1 capsule (100 mg total) by mouth every 8 (eight) hours. 21 capsule Tameah Mihalko K, PA-C      PDMP not reviewed this encounter.   Sherrell Rocky POUR, PA-C 11/28/23 2009

## 2023-11-28 NOTE — Telephone Encounter (Signed)
 Called and spoke to pt regarding no-show to today's scheduled PT appointment. Pt reports her transportation was supposed to pick her up at 2:35pm and she sat outside waiting for them until 3:30pm, so she was unable to call ahead and cancel her appointment today. Informed pt of next appointment date and time, pt verbalized understanding and states she does have transportation arranged.   Zohal Reny E Kirsten Mckone, PT, DPT

## 2023-11-28 NOTE — Discharge Instructions (Signed)
 We are treating you for a bronchitis/emphysema exacerbation.  Start Augmentin  twice daily for 7 days.  Take prednisone  20 mg for 5 days.  Do not take NSAIDs with this medication including aspirin , ibuprofen /Advil , naproxen /Aleve .  Use Tessalon  for cough.  Continue over-the-counter medication such as Mucinex , Tylenol , Flonase , nasal saline/sinus rinses.  Make sure you rest and drink plenty of fluid.  Use your albuterol  inhaler every 4-6 hours as needed.  Follow-up with your primary care in a few days if your symptoms have not resolved.  If anything worsens you have worsening cough, shortness of breath, fever, nausea/vomiting, sudden weight gain, swelling in your legs you need to be seen immediately.

## 2023-11-28 NOTE — Telephone Encounter (Signed)
 FYI Only or Action Required?: Action required by provider: medication request.  Patient was last seen in primary care on 11/03/2023 by Delbert Clam, MD. Called Nurse Triage reporting Cough. Symptoms began several days ago. Interventions attempted: Rest, hydration, or home remedies. Symptoms are: unchanged.  Triage Disposition: See Physician Within 24 Hours  Patient/caregiver understands and will follow disposition?: No, wishes to speak with PCP  Copied from CRM [id number]. Topic: Clinical - Pink Word Triage >> Nov 28, 2023  7:48 AM Berwyn MATSU wrote: Reason for Triage: cough really bad.    Reason for Disposition  [1] Continuous (nonstop) coughing interferes with work or school AND [2] no improvement using cough treatment per Care Advice  Answer Assessment - Initial Assessment Questions 1. ONSET: When did the cough begin?      Bronchitis cough 2. SEVERITY: How bad is the cough today?      Bad talking comfortable 3. SPUTUM: Describe the color of your sputum (none, dry cough; clear, white, yellow, green)     White  4. HEMOPTYSIS: Are you coughing up any blood? If so ask: How much? (flecks, streaks, tablespoons, etc.)     Denies 5. DIFFICULTY BREATHING: Are you having difficulty breathing? If Yes, ask: How bad is it? (e.g., mild, moderate, severe)    - MILD: No SOB at rest, mild SOB with walking, speaks normally in sentences, can lie down, no retractions, pulse < 100.    - MODERATE: SOB at rest, SOB with minimal exertion and prefers to sit, cannot lie down flat, speaks in phrases, mild retractions, audible wheezing, pulse 100-120.    - SEVERE: Very SOB at rest, speaks in single words, struggling to breathe, sitting hunched forward, retractions, pulse > 120      Intermittent at night sob 6. FEVER: Do you have a fever? If Yes, ask: What is your temperature, how was it measured, and when did it start?     no  7. LUNG HISTORY: Do you have any history of lung disease?   (e.g., pulmonary embolus, asthma, emphysema)     Bronchitis  8. OTHER SYMPTOMS: Do you have any other symptoms? (e.g., runny nose, wheezing, chest pain)       Difficulty sleeping.   Additional info:  Refusing evaluation would like prednisone  prescribed.  Protocols used: Cough - Acute Productive-A-AH

## 2023-11-28 NOTE — Telephone Encounter (Signed)
 FYI Only or Action Required?: FYI only for provider.  Patient was last seen in primary care on 11/03/2023 by Newlin, Enobong, MD.  Called Nurse Triage reporting Returning call re: prednisone  request.  Symptoms began several days ago.  Interventions attempted: Rest, hydration, or home remedies.  Symptoms are: gradually worsening.  Triage Disposition: Information or Advice Only Call  Patient/caregiver understands and will follow disposition?: Yes   Copied from CRM [id number]. Topic: Appointments - Red Word Triage >> Nov 28, 2023  3:57 PM Chelsea Branch wrote: Patient has a bad cough and not received a callback. She stated she is going to the urgent care. I attempted to call her back twice. She hung up 2 times and then a third due to the long wait. Reason for Disposition  [1] Other NON-URGENT information for PCP AND [2] does not require PCP response  Answer Assessment - Initial Assessment Questions 1. REASON FOR CALL or QUESTION: What is your reason for calling today? or How can I best help you? or What question do you have that I can help answer?     Is medication going to be called in? 2. CALLER: Document the source of call. (e.g., laboratory, patient).     Patient  Additional info:  Patient triaged earlier today, she was offered an appointment and refused, she requested that prescription prednisone  be called in for her cough (See earlier triage encounter) she is calling back now because she has not heard from provider. This Clinical research associate again offered an appointment however patient states that she needs to schedule ride two days in advanced, does not use MyChart for video and will proceed to urgent care for evaluation and treatment today.  Protocols used: PCP Call - No Triage-A-AH

## 2023-11-28 NOTE — ED Notes (Signed)
 Patient in xray

## 2023-11-28 NOTE — Telephone Encounter (Signed)
 Copied from CRM [id number]. Topic: Clinical - Pink Word Triage >> Nov 28, 2023  7:48 AM Berwyn MATSU wrote: Reason for Triage: cough really bad.

## 2023-11-28 NOTE — ED Triage Notes (Signed)
 Complains of a cough for one week.  Has had mucinex .  Mucinex  does not seem to help.  Pcp told patient to go to ucc

## 2023-11-28 NOTE — Telephone Encounter (Signed)
Patient requesting medication for cough

## 2023-11-28 NOTE — Telephone Encounter (Signed)
 See triage encounter.

## 2023-11-29 ENCOUNTER — Other Ambulatory Visit: Payer: Self-pay

## 2023-11-29 ENCOUNTER — Ambulatory Visit: Admitting: Occupational Therapy

## 2023-11-29 ENCOUNTER — Encounter: Payer: Self-pay | Admitting: Occupational Therapy

## 2023-11-29 ENCOUNTER — Other Ambulatory Visit (HOSPITAL_COMMUNITY): Payer: Self-pay

## 2023-11-29 DIAGNOSIS — R208 Other disturbances of skin sensation: Secondary | ICD-10-CM

## 2023-11-29 DIAGNOSIS — M6281 Muscle weakness (generalized): Secondary | ICD-10-CM | POA: Diagnosis not present

## 2023-11-29 DIAGNOSIS — R41842 Visuospatial deficit: Secondary | ICD-10-CM

## 2023-11-29 DIAGNOSIS — R29818 Other symptoms and signs involving the nervous system: Secondary | ICD-10-CM

## 2023-11-29 DIAGNOSIS — R29898 Other symptoms and signs involving the musculoskeletal system: Secondary | ICD-10-CM

## 2023-11-29 DIAGNOSIS — R278 Other lack of coordination: Secondary | ICD-10-CM

## 2023-11-29 NOTE — Telephone Encounter (Signed)
 She had an urgent care visit.

## 2023-11-29 NOTE — Therapy (Signed)
 OUTPATIENT OCCUPATIONAL THERAPY NEURO EVALUATION  Patient Name: Chelsea Branch MRN: [id number] DOB:April 04, 1966, 58 y.o., female Today's Date: 11/29/2023  PCP: Delbert Clam, MD  REFERRING PROVIDER: Delbert Clam, MD  END OF SESSION:  OT End of Session - 11/29/23 1236     Visit Number 1    Number of Visits 7    Date for OT Re-Evaluation 01/27/24    Authorization Type [employer] - no auth required per appt notes    OT Start Time 1235    OT Stop Time 1316    OT Time Calculation (min) 41 min    Activity Tolerance Patient tolerated treatment well    Behavior During Therapy [facility] for tasks assessed/performed          Past Medical History:  Diagnosis Date   Abnormal thyroid  blood test 03/15/2017   Abnormal uterine bleeding    Alopecia    Anemia    Angina pectoris with normal coronary arteriogram (HCC) 2017   Had + Troponin c/w ? 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 iniatially ?2/2 peripartum in 1994 - improved by 2008 then worsening EF in 2011 back down to EF 25-30%. b. echo 01/21/14 showed mod LVH, [name]; 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   Peripartum cardiomyopathy 1994   Sleep apnea 2015   CPAP 12/2013   Stroke (HCC) 2011   Systolic and diastolic CHF, acute on chronic (HCC) 06/14/2015   Tobacco abuse    Type 2 diabetes mellitus (HCC) 04/28/2023   Past Surgical History:  Procedure Laterality Date   CARDIAC CATHETERIZATION N/A 06/16/2015   Procedure: Left Heart Cath and Coronary  Angiography;  Surgeon: Candyce GORMAN Reek, MD;  Location: [facility] INVASIVE CV LAB;  Service: Cardiovascular;  Laterality: N/A;   CESAREAN SECTION  1992  1994   INTRAUTERINE DEVICE (IUD) INSERTION     about 3 years ago   LOOP RECORDER IMPLANT  ~ 2000   spine injections      [facility],  Dr Darlis   TIBIAL TUBERCLERPLASTY     TRANSESOPHAGEAL ECHOCARDIOGRAM (CATH LAB) N/A 04/20/2023   Procedure: TRANSESOPHAGEAL ECHOCARDIOGRAM;  Surgeon: Alvan Ronal BRAVO, MD;  Location: [facility] INVASIVE CV LAB;  Service: Cardiovascular;  Laterality: N/A;   TUBAL LIGATION  05/24/1992   Patient Active Problem List   Diagnosis Date Noted   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   History of stroke with residual effects 07/06/2023   Frequent falls 07/06/2023   Type 2 diabetes mellitus (HCC) 04/28/2023   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) 04/28/2023   History of cerebellar stroke 04/13/2023   History of stroke 04/11/2023   Liletta  IUD (intrauterine device) in place since 12/15/2015 04/06/2023   Chronic pain syndrome 12/28/2021   Primary osteoarthritis of both knees 12/28/2021   Spinal stenosis of lumbar region 07/21/2021   Lumbar radiculitis 07/21/2021   COPD (chronic obstructive pulmonary disease) (HCC) 07/21/2021   Mechanical complication due to intrauterine contraceptive device 07/21/2021  Arthropathy of lumbar facet joint 03/14/2019   Degenerative disc disease at L5-S1 level 10/10/2018   Hyperthyroidism 04/26/2016   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   History of non-ST elevation myocardial infarction (NSTEMI) 06/14/2015   Homelessness 09/16/2014   Vitamin D  deficiency 01/29/2014   OSA (obstructive sleep apnea) 01/08/2014   Chronic combined systolic and diastolic CHF (congestive heart failure) (HCC) 08/14/2013   Essential hypertension    Fibroids 08/09/2012   Menorrhagia 05/08/2012   Female pattern hair loss    HLD (hyperlipidemia) 06/08/2010    CEREBRAL ANEURYSM 06/08/2010   History of cardiovascular disorder 06/08/2010    ONSET DATE: 10/24/2023 (referral)   REFERRING DIAG: I69.30 (ICD-10-CM) - History of stroke with residual effects   THERAPY DIAG:  Muscle weakness (generalized)  Visuospatial deficit  Other lack of coordination  Other disturbances of skin sensation  Other symptoms and signs involving the musculoskeletal system  Other symptoms and signs involving the nervous system  Rationale for Evaluation and Treatment: Rehabilitation  SUBJECTIVE:   SUBJECTIVE STATEMENT: She misses being able to work at [employer]. Her cardiologist has told her that she is unable to work as she did before due to her heart's poor function. In the meantime, she has not been getting paid and is homeless but living with a partner. Her goal is to get a place of her own. She has applied for disability. The pt reports she has been depressed and would like to do something with her days. She might even want to foster children. She raised 3 sons by herself. Keeping things clean and tidy is a passion of hers even though it requires her to use a chair to take breaks given her poor activity tolerance. Even before her stroke, she was having difficulty sleeping. This is worse now. Her doctor recommended something for her to take, but it did not help her sleep better. She went to the ED yesterday and was dx with bronchitis. She is hoping transportation will be able to take her to pick up her medications after her OT visit.   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   PRECAUTIONS: Fall  WEIGHT BEARING RESTRICTIONS: No  PAIN:  Are you having pain? No  FALLS: Has  patient fallen in last 6 months? No  LIVING ENVIRONMENT: Lives with: lives with their partner Lives in: House/apartment Stairs: No Has following equipment at home: Vannie - 4 wheeled  PLOF: Requires assistive device for independence; worked in a [employer]; does not have a car to drive; likes baking and cooking; reading; Archivist; crocheting  PATIENT GOALS: To improve level of activity during the day; join gym; get into a home of her own  OBJECTIVE:  Note: Objective measures were completed at Evaluation unless otherwise noted.  HAND DOMINANCE: Right  ADLs: Overall ADLs: mod I  Equipment: had shower chair but lost her storage unit and is homeless  IADLs:  Light housekeeping: sweeping and mopping are a little more difficult and she has to sit or take breaks between  Handwriting: pt reports her handwriting is about 10% of prior level  MOBILITY STATUS: Needs Assist: Ambulates with use of rollator  ACTIVITY TOLERANCE: Activity tolerance: fair to poor  FUNCTIONAL OUTCOME MEASURES: Quick Dash:  40.9 % disability   UPPER EXTREMITY ROM:    BUE: WNL  UPPER EXTREMITY MMT:     BUE: WFL though poor endurance  HAND FUNCTION: Grip strength: Right: 37.2 lbs; Left: 36.5 lbs  COORDINATION: 9 Hole Peg test: Right: 24 sec; Left: 25 sec  SENSATION: Paresthesias in RUE from previous work-related accident  EDEMA: mild reported and observed  MUSCLE TONE: WFL  COGNITION: Overall cognitive status: Impaired  VISION: Subjective report: Pt has difficulty seeing items on left side and has spots in vision that are not clear. She turns head to left side to be able to see things. Baseline vision: does not have corrected lenses Visual history: Thyroid  eye disease  VISION ASSESSMENT: Reading and distance acuity: WFL  45* left peripheral vision; R peripheral vision WFL  PERCEPTION: Impaired: Spatial orientation: unable to orient to dry erase marker; reports  difficulty with writing to make letters correctly  PRAXIS: Impaired: Motor planning  OBSERVATIONS: Pt ambulates with use of rollator. No loss of balance. The pt appears fairly well kept and has mask donned.                                                                                                                           TREATMENT :    Treatment time not billed as insurance does not reimburse for treatment provided at evaluation.    OT educated pt on rehabilitation process and results of objective measures in relation to pt specific goals.    PATIENT EDUCATION: Education details: OT Role and POC Person educated: Patient Education method: Explanation Education comprehension: verbalized understanding and needs further education  HOME EXERCISE PROGRAM: N/A for this visit  GOALS:  SHORT TERM GOALS: Target date: 12/27/2023    Patient will demonstrate initial UE HEP with 25% verbal cues or less for proper execution. Baseline: Goal status: INITIAL  2.  Patient will independently verbalize at least 3 energy conservation principles in relation to ADLs to increase functional independence. Baseline:  Goal status: INITIAL  3.  Patient will independently recall at least 2 compensatory strategies for visual impairment without cueing. Baseline:  Goal status: INITIAL  4.  Pt will participate in creating a daily schedule as needed to form new routines and improve overall mental health. Baseline:  Goal status: INITIAL  LONG TERM GOALS: Target date: 01/27/2024    Patient will demonstrate UE HEP with visual handouts only for proper execution. Baseline:  Goal status: INITIAL  2.  Patient will demonstrate at least 16% improvement with quick Dash score (reporting 24.9% disability or less) indicating improved functional use of affected extremity. Baseline: 40.9 % disability Goal status: INITIAL  3.  Patient will independently recall at least 3 strategies for better sleep/sleep  hygiene. Baseline:  Goal status: INITIAL  ASSESSMENT:  CLINICAL IMPRESSION: Patient is a 58 y.o. female who was seen today for occupational therapy evaluation for CVA. 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 Patient currently presents below baseline level of functioning demonstrating functional deficits and impairments as noted below. Pt would benefit from skilled OT services in the outpatient setting to work on impairments as noted below to help pt return to PLOF as able.    PERFORMANCE DEFICITS: in functional skills including ADLs, IADLs, coordination, sensation, edema, strength, Fine motor control, mobility, endurance, decreased knowledge of use of DME, vision, and UE functional use, cognitive skills including orientation and perception, and psychosocial skills including coping strategies, environmental adaptation, and routines and behaviors.   IMPAIRMENTS: are limiting patient from ADLs, IADLs, rest and sleep, work, and leisure.   CO-MORBIDITIES: has co-morbidities such as NICM and COPD that affects occupational performance. Patient will benefit from skilled OT to address above impairments and improve overall function.  MODIFICATION OR ASSISTANCE TO COMPLETE EVALUATION: Min-Moderate modification of tasks or assist with assess necessary to complete an evaluation.  OT OCCUPATIONAL PROFILE AND HISTORY: Detailed assessment: Review of records and additional review of physical, cognitive, psychosocial history related to current functional performance.  CLINICAL DECISION MAKING: Moderate - several treatment options, min-mod task modification necessary  REHAB POTENTIAL: Good for goals stated  EVALUATION COMPLEXITY:  Moderate    PLAN:  OT FREQUENCY: 1x/week  OT DURATION: 6 weeks  PLANNED INTERVENTIONS: 97168 OT Re-evaluation, 97535 self care/ADL training, 02889 therapeutic exercise, 97530 therapeutic activity, 97112 neuromuscular re-education, 97140 manual therapy, 97035 ultrasound, 97039 fluidotherapy, functional mobility training, visual/perceptual remediation/compensation, energy conservation, coping strategies training, patient/family education, and DME and/or AE instructions  RECOMMENDED OTHER SERVICES: [facility] for health management/SW  CONSULTED AND AGREED WITH PLAN OF CARE: Patient  PLAN FOR NEXT SESSION: EC; vision strategies; sleep hygiene; basic strengthening; daily routines in place of work; [facility] referral? - pt mentioned homeless and would like to get a place of her own   Jocelyn CHRISTELLA Bottom, [place] 11/29/2023, 6:05 PM   For all possible CPT codes, reference the Planned Interventions line above.     Check all conditions that are expected to impact treatment: {Conditions expected to impact treatment:Respiratory disorders, Cognitive Impairment or Intellectual disability, Diabetes mellitus, Social determinants of health, and Active major medical illness   If treatment provided at initial evaluation, no treatment charged due to lack of authorization.

## 2023-11-30 ENCOUNTER — Ambulatory Visit: Admitting: Physical Therapy

## 2023-11-30 VITALS — BP 133/87 | HR 71

## 2023-11-30 DIAGNOSIS — M6281 Muscle weakness (generalized): Secondary | ICD-10-CM

## 2023-11-30 DIAGNOSIS — R2689 Other abnormalities of gait and mobility: Secondary | ICD-10-CM

## 2023-11-30 DIAGNOSIS — R2681 Unsteadiness on feet: Secondary | ICD-10-CM

## 2023-11-30 NOTE — Therapy (Signed)
 OUTPATIENT PHYSICAL THERAPY NEURO TREATMENT   Patient Name: Chelsea Branch MRN: [id number] DOB:1965/12/23, 58 y.o., female Today's Date: 11/30/2023   PCP: Delbert Clam, MD REFERRING PROVIDER: Delbert Clam, MD  END OF SESSION:  PT End of Session - 11/30/23 0847     Visit Number 4    Number of Visits 9    Date for PT Re-Evaluation 12/22/23    Authorization Type [place] [employer]   4 visits used   PT Start Time [phone]    PT Stop Time 0930    PT Time Calculation (min) 44 min    Activity Tolerance Patient tolerated treatment well    Behavior During Therapy [facility] for tasks assessed/performed            Past Medical History:  Diagnosis Date   Abnormal thyroid  blood test 03/15/2017   Abnormal uterine bleeding    Alopecia    Anemia    Angina pectoris with normal coronary arteriogram (HCC) 2017   Had + Troponin c/w ? 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 iniatially ?2/2 peripartum in 1994 - improved by 2008 then worsening EF in 2011 back down to EF 25-30%. b. echo 01/21/14 showed mod LVH, [name]; 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   Peripartum cardiomyopathy 1994   Sleep apnea 2015   CPAP 12/2013   Stroke (HCC) 2011   Systolic and diastolic CHF, acute on chronic (HCC) 06/14/2015   Tobacco abuse    Type 2 diabetes mellitus (HCC) 04/28/2023   Past Surgical History:  Procedure Laterality Date   CARDIAC CATHETERIZATION N/A 06/16/2015   Procedure: Left Heart Cath and Coronary Angiography;   Surgeon: Candyce GORMAN Reek, MD;  Location: [facility] INVASIVE CV LAB;  Service: Cardiovascular;  Laterality: N/A;   CESAREAN SECTION  1992  1994   INTRAUTERINE DEVICE (IUD) INSERTION     about 3 years ago   LOOP RECORDER IMPLANT  ~ 2000   spine injections      [facility],  Dr Darlis   TIBIAL TUBERCLERPLASTY     TRANSESOPHAGEAL ECHOCARDIOGRAM (CATH LAB) N/A 04/20/2023   Procedure: TRANSESOPHAGEAL ECHOCARDIOGRAM;  Surgeon: Alvan Ronal BRAVO, MD;  Location: [facility] INVASIVE CV LAB;  Service: Cardiovascular;  Laterality: N/A;   TUBAL LIGATION  05/24/1992   Patient Active Problem List   Diagnosis Date Noted   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   History of stroke with residual effects 07/06/2023   Frequent falls 07/06/2023   Type 2 diabetes mellitus (HCC) 04/28/2023   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) 04/28/2023   History of cerebellar stroke 04/13/2023   History of stroke 04/11/2023   Liletta  IUD (intrauterine device) in place since 12/15/2015 04/06/2023   Chronic pain syndrome 12/28/2021   Primary osteoarthritis of both knees 12/28/2021   Spinal stenosis of lumbar region 07/21/2021   Lumbar radiculitis 07/21/2021   COPD (chronic obstructive pulmonary disease) (HCC) 07/21/2021   Mechanical complication due to intrauterine contraceptive device 07/21/2021  Arthropathy of lumbar facet joint 03/14/2019   Degenerative disc disease at L5-S1 level 10/10/2018   Hyperthyroidism 04/26/2016   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   History of non-ST elevation myocardial infarction (NSTEMI) 06/14/2015   Homelessness 09/16/2014   Vitamin D  deficiency 01/29/2014   OSA (obstructive sleep apnea) 01/08/2014   Chronic combined systolic and diastolic CHF (congestive heart failure) (HCC) 08/14/2013   Essential hypertension    Fibroids 08/09/2012   Menorrhagia 05/08/2012   Female pattern hair loss    HLD (hyperlipidemia) 06/08/2010   CEREBRAL ANEURYSM  06/08/2010   History of cardiovascular disorder 06/08/2010    ONSET DATE: 10/24/2023 (referral)   REFERRING DIAG: I69.30 (ICD-10-CM) - History of stroke with residual effects  THERAPY DIAG:  Muscle weakness (generalized)  Unsteadiness on feet  Other abnormalities of gait and mobility  Rationale for Evaluation and Treatment: Rehabilitation  SUBJECTIVE:                                                                                                                                                                                             SUBJECTIVE STATEMENT: Presents w/rollator. States she was not feeling well on Monday and was started on antibiotics due to coughing. Feels better today. Walked to the store yesterday and felt okay. Denies pain or falls today.    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  PAIN:  Are you having pain? No  PRECAUTIONS: Fall  RED FLAGS: None   WEIGHT BEARING RESTRICTIONS: No  FALLS: Has patient fallen in last 6 months? Yes. Number of falls 4-5  LIVING ENVIRONMENT: Lives with: lives with their partner Lives in: House/apartment Stairs: No Has following equipment at home: Walker - 4 wheeled  PLOF: Requires assistive device for independence  PATIENT GOALS: to walk to get my mobility like I used to   OBJECTIVE:  Note: Objective measures were completed at Evaluation unless otherwise noted.  DIAGNOSTIC FINDINGS: MRI of brain from 03/2023   IMPRESSION: Acute infarcts in the right occipital lobe, along the posterior aspect of the left insula, and in the left cerebellar hemisphere with additional tiny foci of possible acute infarction in the left parietal  lobe. Findings are suspicious for cardioembolic etiology. Trace petechial hemorrhage in the medial aspect of the right occipital lobe and posterior left insula. No significant mass effect.  COGNITION: Overall cognitive status: Impaired  SENSATION: Pt denies numbness/tingling in BLEs   POSTURE: rounded shoulders, forward head, and increased thoracic kyphosis  LOWER EXTREMITY ROM:     Active  Right Eval Left Eval  Hip flexion    Hip extension    Hip abduction    Hip adduction    Hip internal rotation    Hip external rotation    Knee flexion    Knee extension    Ankle dorsiflexion    Ankle plantarflexion    Ankle inversion    Ankle eversion     (Blank rows = not tested)  LOWER EXTREMITY MMT:    MMT Right Eval Left Eval  Hip flexion    Hip extension    Hip abduction    Hip adduction    Hip internal rotation    Hip external rotation    Knee flexion    Knee extension    Ankle dorsiflexion    Ankle plantarflexion    Ankle inversion    Ankle eversion    (Blank rows = not tested)  BED MOBILITY:  Not tested  TRANSFERS: Sit to stand: Modified independence  Assistive device utilized: Environmental consultant - 4 wheeled     Stand to sit: Modified independence  Assistive device utilized: Environmental consultant - 4 wheeled     Pt demonstrates heavy reliance on BUEs to perform and pushes up on rollator rather than using armrests.   RAMP:  Not tested  CURB:  Not tested  STAIRS: Not tested GAIT: Gait pattern: step through pattern, decreased stride length, lateral hip instability, trunk flexed, poor foot clearance- Right, and poor foot clearance- Left Distance walked: Various clinic distances  Assistive device utilized: Walker - 4 wheeled Level of assistance: Modified independence Comments: Pt wearing slides this date which enables her to slide her feet on the floor. Pt reports she prefers this as it is easier and does not like wearing tennis shoes. Encouraged pt to wear closed-toe shoes as  this is safer and will encouraged her to pick feet up.    VITALS  Vitals:   11/30/23 0850  BP: 133/87  Pulse: 71                                                                                                         TREATMENT:   Self-care/home management  Assessed vitals (see above) and WNL. Pt reports she did not take her medications this AM.   Ther Act/Ex SciFit multi-peaks level 6.5 for 8 minutes using BUE/BLEs for neural priming for reciprocal movement, dynamic cardiovascular warmup and increased amplitude of stepping. RPE of 8.5/10 following activity  Per pt request, reviewed seated march overs using 10# KB, x12 reps per side, for improved hip flexor strength and core stability. Added 3# ankle weights to legs and performed x12 reps per side for added hip flexor strengthening.  Spanish squats w/red resistance band and intermittent UE support, 2x15 reps, for improved endurance and quad strength. Pt reported 8/10 RPE w/activity.  Quadruped bird dogs, x8 reps per side, for improved core stability, single leg stability and posterior  chain strength. Pt unable to perform reciprocal pattern with arms and legs, so regressed to moving legs only. Pt required min A at pelvis to stabilize w/activity and demonstrated increased weakness on RLE > LLE.  Resisted knee extension w/green theraband around ankles, x10 reps per side, for improved quad strength. Added to HEP (see bolded below)    PATIENT EDUCATION: Education details: Updates to HEP, continue walking program Person educated: Patient Education method: Explanation, Demonstration, Tactile cues, Verbal cues, and Handouts Education comprehension: verbalized understanding, returned demonstration, verbal cues required, tactile cues required, and needs further education  HOME EXERCISE PROGRAM: Access Code: [email or web address] URL: [email or web address] Date: 11/21/2023 Prepared by: Marlon Ohanna Gassert  Exercises - Sit to Stand Without Arm  Support  - 1 x daily - 7 x weekly - 3 sets - 5-10 reps - Standing March with Counter Support  - 1 x daily - 7 x weekly - 2 sets - 20 reps - Standing Knee Flexion with Counter Support  - 1 x daily - 7 x weekly - 2 sets - 10 reps - Standing Hip Abduction with Unilateral Counter Support  - 1 x daily - 7 x weekly - 2 sets - 12-15 reps - Seated march over  - 1 x daily - 7 x weekly - 1-2 sets - 10-15 reps - Seated Knee Extension with Resistance  - 1 x daily - 7 x weekly - 2-3 sets - 10-12 reps  You Can Walk For A Certain Length Of Time Each Day                          Walk 15 minutes 1 time per day.             Increase 2  minutes every 7 days              Work up to 30 minutes (1-2 times per day).           GOALS: Goals reviewed with patient? Yes  STG = LTG DUE TO POC LENGTH   LONG TERM GOALS: Target date: 12/22/2023   Pt will improve 5 x STS to less than or equal to 20 seconds w/proper hand positioning to demonstrate improved functional strength and transfer efficiency.   Baseline: 29.47s w/BUE support on rollator  Goal status: INITIAL  2.  Pt will improve gait velocity to at least 2.5 ft/s w/LRAD for improved gait efficiency and independence  Baseline: 2.29 ft/s w/rollator  Goal status: INITIAL  3.  Pt will ambulate greater than or equal to 1000 feet on with LRAD mod I for improved cardiovascular endurance and BLE strength.   Baseline: 805' w/rollator  Goal status: REVISED  4.  Pt will be independent with final HEP for improved strength, balance, transfers and gait.  Baseline: to be reviewed  Goal status: INITIAL  ASSESSMENT:  CLINICAL IMPRESSION: Emphasis of skilled PT session on posterior chain strength, endurance and functional quad strengthening. Pt perseverating on being fat throughout session, requiring redirection to focus on exercises and to work on improved endurance via walking program. Pt demonstrates functional weakness of R hip w/inability to perform  reciprocal bird dogs due to instability on R side. Pt motivated to work in PT and is progressing well towards LTGs. Continue POC.    OBJECTIVE IMPAIRMENTS: Abnormal gait, cardiopulmonary status limiting activity, decreased activity tolerance, decreased balance, decreased cognition, decreased endurance, decreased knowledge of use of DME, decreased mobility, decreased strength, decreased safety awareness, impaired  perceived functional ability, impaired vision/preception, improper body mechanics, and pain  ACTIVITY LIMITATIONS: carrying, lifting, squatting, stairs, transfers, hygiene/grooming, locomotion level, and caring for others  PARTICIPATION LIMITATIONS: meal prep, cleaning, interpersonal relationship, driving, shopping, community activity, occupation, and yard work  PERSONAL FACTORS: Age, Fitness, Past/current experiences, Transportation, and 1 comorbidity: history of CVA are also affecting patient's functional outcome.   REHAB POTENTIAL: Good  CLINICAL DECISION MAKING: Evolving/moderate complexity  EVALUATION COMPLEXITY: Moderate  PLAN:  PT FREQUENCY: 1-2x/week  PT DURATION: 4 weeks  PLANNED INTERVENTIONS: 97164- PT Re-evaluation, 97750- Physical Performance Testing, 97110-Therapeutic exercises, 97530- Therapeutic activity, [id number]- Neuromuscular re-education, 97535- Self Care, [id number]- Manual therapy, [id number]- Gait training, [phone]- Aquatic Therapy, [phone]- Electrical stimulation (manual), [phone] (1-2 muscles), [id number] (3+ muscles)- Dry Needling, Patient/Family education, Balance training, Stair training, Joint mobilization, Spinal mobilization, Vestibular training, and DME instructions  PLAN FOR NEXT SESSION: Add to HEP as appropriate for functional BLE strength and endurance. Work on BLE strength, reaching tasks and endurance, core stability   Check all possible CPT codes: See Planned Interventions List for Planned CPT Codes    Check all conditions that are expected to impact treatment:  Social determinants of health   If treatment provided at initial evaluation, no treatment charged due to lack of authorization.     Lillyen Schow E Delainy Mcelhiney, PT, DPT 11/30/2023, 9:34 AM

## 2023-12-05 ENCOUNTER — Ambulatory Visit: Admitting: Physical Therapy

## 2023-12-05 VITALS — BP 143/95 | HR 72

## 2023-12-05 DIAGNOSIS — R2689 Other abnormalities of gait and mobility: Secondary | ICD-10-CM

## 2023-12-05 DIAGNOSIS — M6281 Muscle weakness (generalized): Secondary | ICD-10-CM | POA: Diagnosis not present

## 2023-12-05 DIAGNOSIS — R2681 Unsteadiness on feet: Secondary | ICD-10-CM

## 2023-12-05 NOTE — Therapy (Signed)
 OUTPATIENT PHYSICAL THERAPY NEURO TREATMENT   Patient Name: Chelsea Branch MRN: [id number] DOB:[birth year], 58 y.o., female Today's Date: 12/05/2023   PCP: Delbert Clam, MD REFERRING PROVIDER: Delbert Clam, MD  END OF SESSION:  PT End of Session - 12/05/23 1532     Visit Number 5    Number of Visits 9    Date for PT Re-Evaluation 12/22/23    Authorization Type [place] [employer]   4 visits used   PT Start Time 1532    PT Stop Time 1550   HTN   PT Time Calculation (min) 18 min    Activity Tolerance Other (comment)   HTN   Behavior During Therapy WFL for tasks assessed/performed             Past Medical History:  Diagnosis Date   Abnormal thyroid  blood test 03/15/2017   Abnormal uterine bleeding    Alopecia    Anemia    Angina pectoris with normal coronary arteriogram (HCC) 2017   Had + Troponin c/w ? 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 iniatially ?2/2 peripartum in 1994 - improved by 2008 then worsening EF in 2011 back down to EF 25-30%. b. echo 01/21/14 showed mod LVH, [name]; 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   Peripartum cardiomyopathy 1994   Sleep apnea 2015   CPAP 12/2013   Stroke (HCC) 2011   Systolic and diastolic CHF, acute on chronic (HCC) 06/14/2015   Tobacco abuse    Type 2 diabetes mellitus (HCC) 04/28/2023   Past Surgical History:  Procedure Laterality Date   CARDIAC CATHETERIZATION N/A 06/16/2015   Procedure: Left Heart Cath and Coronary Angiography;  Surgeon:  Candyce GORMAN Reek, MD;  Location: [facility] INVASIVE CV LAB;  Service: Cardiovascular;  Laterality: N/A;   CESAREAN SECTION  1992  1994   INTRAUTERINE DEVICE (IUD) INSERTION     about 3 years ago   LOOP RECORDER IMPLANT  ~ 2000   spine injections      [facility],  Dr Darlis   TIBIAL TUBERCLERPLASTY     TRANSESOPHAGEAL ECHOCARDIOGRAM (CATH LAB) N/A 04/20/2023   Procedure: TRANSESOPHAGEAL ECHOCARDIOGRAM;  Surgeon: Alvan Ronal BRAVO, MD;  Location: [facility] INVASIVE CV LAB;  Service: Cardiovascular;  Laterality: N/A;   TUBAL LIGATION  05/24/1992   Patient Active Problem List   Diagnosis Date Noted   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   History of stroke with residual effects 07/06/2023   Frequent falls 07/06/2023   Type 2 diabetes mellitus (HCC) 04/28/2023   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) 04/28/2023   History of cerebellar stroke 04/13/2023   History of stroke 04/11/2023   Liletta  IUD (intrauterine device) in place since 12/15/2015 04/06/2023   Chronic pain syndrome 12/28/2021   Primary osteoarthritis of both knees 12/28/2021   Spinal stenosis of lumbar region 07/21/2021   Lumbar radiculitis 07/21/2021   COPD (chronic obstructive pulmonary disease) (HCC) 07/21/2021   Mechanical complication due to intrauterine contraceptive device  07/21/2021   Arthropathy of lumbar facet joint 03/14/2019   Degenerative disc disease at L5-S1 level 10/10/2018   Hyperthyroidism 04/26/2016   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   History of non-ST elevation myocardial infarction (NSTEMI) 06/14/2015   Homelessness 09/16/2014   Vitamin D  deficiency 01/29/2014   OSA (obstructive sleep apnea) 01/08/2014   Chronic combined systolic and diastolic CHF (congestive heart failure) (HCC) 08/14/2013   Essential hypertension    Fibroids 08/09/2012   Menorrhagia 05/08/2012   Female pattern hair loss    HLD (hyperlipidemia) 06/08/2010   CEREBRAL ANEURYSM 06/08/2010    History of cardiovascular disorder 06/08/2010    ONSET DATE: 10/24/2023 (referral)   REFERRING DIAG: I69.30 (ICD-10-CM) - History of stroke with residual effects  THERAPY DIAG:  Muscle weakness (generalized)  Unsteadiness on feet  Other abnormalities of gait and mobility  Rationale for Evaluation and Treatment: Rehabilitation  SUBJECTIVE:                                                                                                                                                                                             SUBJECTIVE STATEMENT: Presents w/rollator, frequently tripping over her feet requiring assistance to prevent fall. Pt reports she felt off' yesterday, had a very hard time getting up and her R arm felt numb. Also had a HA. Did not check her BP.   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  PAIN:  Are you having pain? No  PRECAUTIONS: Fall  RED FLAGS: None   WEIGHT BEARING RESTRICTIONS: No  FALLS: Has patient fallen in last 6 months? Yes. Number of falls 4-5  LIVING ENVIRONMENT: Lives with: lives with their partner Lives in: House/apartment Stairs: No Has following equipment at home: Walker - 4 wheeled  PLOF: Requires assistive device for independence  PATIENT GOALS: to walk to get my mobility like I used to   OBJECTIVE:  Note: Objective measures were completed at Evaluation unless otherwise noted.  DIAGNOSTIC FINDINGS: MRI of brain from 03/2023   IMPRESSION: Acute infarcts in the right occipital lobe, along the posterior aspect of the left insula, and in the left cerebellar hemisphere with additional tiny foci of possible acute infarction in the  left parietal lobe. Findings are suspicious for cardioembolic etiology. Trace petechial hemorrhage in the medial aspect of the right occipital lobe and posterior left insula. No significant  mass effect.  COGNITION: Overall cognitive status: Impaired   SENSATION: Pt denies numbness/tingling in BLEs   POSTURE: rounded shoulders, forward head, and increased thoracic kyphosis  LOWER EXTREMITY ROM:     Active  Right Eval Left Eval  Hip flexion    Hip extension    Hip abduction    Hip adduction    Hip internal rotation    Hip external rotation    Knee flexion    Knee extension    Ankle dorsiflexion    Ankle plantarflexion    Ankle inversion    Ankle eversion     (Blank rows = not tested)  LOWER EXTREMITY MMT:  Tested on 7/14   MMT Right  Left   Hip flexion 4- 4  Hip extension    Hip abduction 4+ 4+  Hip adduction 4+ 4+  Hip internal rotation    Hip external rotation    Knee flexion 4+ 4+  Knee extension 4+ 4+  Ankle dorsiflexion    Ankle plantarflexion    Ankle inversion    Ankle eversion    (Blank rows = not tested)  BED MOBILITY:  Not tested  TRANSFERS: Sit to stand: Modified independence  Assistive device utilized: Environmental consultant - 4 wheeled     Stand to sit: Modified independence  Assistive device utilized: Environmental consultant - 4 wheeled     Pt demonstrates heavy reliance on BUEs to perform and pushes up on rollator rather than using armrests.   RAMP:  Not tested  CURB:  Not tested  STAIRS: Not tested GAIT: Gait pattern: step through pattern, decreased stride length, lateral hip instability, trunk flexed, poor foot clearance- Right, and poor foot clearance- Left Distance walked: Various clinic distances  Assistive device utilized: Walker - 4 wheeled Level of assistance: Modified independence Comments: Pt wearing slides this date which enables her to slide her feet on the floor. Pt reports she prefers this as it is easier and does not like wearing tennis shoes.  Encouraged pt to wear closed-toe shoes as this is safer and will encouraged her to pick feet up.    VITALS  Vitals:   12/05/23 1533 12/05/23 1538  BP: (!) 147/95 (!) 143/95  Pulse: 74 72                                                                                                       TREATMENT:   Self-care/home management  Assessed vitals (see above) and diastolic BP elevated above normal for pt. Pt reports she took her medications this morning and is feeling better today. Educated pt on BE FAST and recommended pt go to ED if she starts having any symptoms. Also provided pt w/BP log and recommended she monitor her BP over next couple of days. Pt verbalized understanding and requested to hold off on PT today due to elevated BP.  Assessed MMT:   LOWER EXTREMITY MMT:  Tested on 7/14   MMT Right  Left   Hip flexion 4- 4  Hip extension    Hip abduction 4+ 4+  Hip adduction 4+  4+  Hip internal rotation    Hip external rotation    Knee flexion 4+ 4+  Knee extension 4+ 4+  Ankle dorsiflexion    Ankle plantarflexion    Ankle inversion    Ankle eversion    (Blank rows = not tested)  Gait pattern: step through pattern, decreased stride length, decreased hip/knee flexion- Right, decreased hip/knee flexion- Left, decreased ankle dorsiflexion- Right, decreased ankle dorsiflexion- Left, shuffling, lateral hip instability, poor foot clearance- Right, and poor foot clearance- Left Distance walked: various clinic distances  Assistive device utilized: Walker - 4 wheeled Level of assistance: CGA and Min A Comments: Pt wearing slides today and frequently tripping over bilateral feet, requiring CGA-min A to slow down and prevent fall.      PATIENT EDUCATION: Education details: See self-care above  Person educated: Patient Education method: Explanation, Demonstration, and Handouts Education comprehension: verbalized understanding, returned demonstration, and needs further  education  HOME EXERCISE PROGRAM: Access Code: [email or web address] URL: [email or web address] Date: 11/21/2023 Prepared by: Marlon Carlise Stofer  Exercises - Sit to Stand Without Arm Support  - 1 x daily - 7 x weekly - 3 sets - 5-10 reps - Standing March with Counter Support  - 1 x daily - 7 x weekly - 2 sets - 20 reps - Standing Knee Flexion with Counter Support  - 1 x daily - 7 x weekly - 2 sets - 10 reps - Standing Hip Abduction with Unilateral Counter Support  - 1 x daily - 7 x weekly - 2 sets - 12-15 reps - Seated march over  - 1 x daily - 7 x weekly - 1-2 sets - 10-15 reps - Seated Knee Extension with Resistance  - 1 x daily - 7 x weekly - 2-3 sets - 10-12 reps  You Can Walk For A Certain Length Of Time Each Day                          Walk 15 minutes 1 time per day.             Increase 2  minutes every 7 days              Work up to 30 minutes (1-2 times per day).           GOALS: Goals reviewed with patient? Yes  STG = LTG DUE TO POC LENGTH   LONG TERM GOALS: Target date: 12/22/2023   Pt will improve 5 x STS to less than or equal to 20 seconds w/proper hand positioning to demonstrate improved functional strength and transfer efficiency.   Baseline: 29.47s w/BUE support on rollator  Goal status: INITIAL  2.  Pt will improve gait velocity to at least 2.5 ft/s w/LRAD for improved gait efficiency and independence  Baseline: 2.29 ft/s w/rollator  Goal status: INITIAL  3.  Pt will ambulate greater than or equal to 1000 feet on with LRAD mod I for improved cardiovascular endurance and BLE strength.   Baseline: 805' w/rollator  Goal status: REVISED  4.  Pt will be independent with final HEP for improved strength, balance, transfers and gait.  Baseline: to be reviewed  Goal status: INITIAL  ASSESSMENT:  CLINICAL IMPRESSION: Session limited due to diastolic HTN. Pt presented w/rollator but frequently tripped over both feet, which pt reports was due to her  shoes but this has not been occurring in PT. Pt reports she felt weak yesterday, had a headache and her RUE  went numb but did not assess her BP. Pt feeling better today but due to elevated BP, decided to hold off on PT. Educated pt on BE FAST and provided pt w/BP log to monitor her BP at home. Recommended pt go to ED if her BP >180/100 mmHg or she has signs/symptoms of CVA. Pt verbalized understanding. Continue POC.    OBJECTIVE IMPAIRMENTS: Abnormal gait, cardiopulmonary status limiting activity, decreased activity tolerance, decreased balance, decreased cognition, decreased endurance, decreased knowledge of use of DME, decreased mobility, decreased strength, decreased safety awareness, impaired perceived functional ability, impaired vision/preception, improper body mechanics, and pain  ACTIVITY LIMITATIONS: carrying, lifting, squatting, stairs, transfers, hygiene/grooming, locomotion level, and caring for others  PARTICIPATION LIMITATIONS: meal prep, cleaning, interpersonal relationship, driving, shopping, community activity, occupation, and yard work  PERSONAL FACTORS: Age, Fitness, Past/current experiences, Transportation, and 1 comorbidity: history of CVA are also affecting patient's functional outcome.   REHAB POTENTIAL: Good  CLINICAL DECISION MAKING: Evolving/moderate complexity  EVALUATION COMPLEXITY: Moderate  PLAN:  PT FREQUENCY: 1-2x/week  PT DURATION: 4 weeks  PLANNED INTERVENTIONS: 97164- PT Re-evaluation, 97750- Physical Performance Testing, 97110-Therapeutic exercises, 97530- Therapeutic activity, [id number]- Neuromuscular re-education, 97535- Self Care, [id number]- Manual therapy, [id number]- Gait training, [phone]- Aquatic Therapy, [phone]- Electrical stimulation (manual), [phone] (1-2 muscles), [id number] (3+ muscles)- Dry Needling, Patient/Family education, Balance training, Stair training, Joint mobilization, Spinal mobilization, Vestibular training, and DME instructions  PLAN FOR NEXT SESSION:  Monitor BP. Add to HEP as appropriate for functional BLE strength and endurance. Work on BLE strength, reaching tasks and endurance, core stability   Check all possible CPT codes: See Planned Interventions List for Planned CPT Codes    Check all conditions that are expected to impact treatment: Social determinants of health   If treatment provided at initial evaluation, no treatment charged due to lack of authorization.     Endia Moncur E Dametria Tuzzolino, PT, DPT 12/05/2023, 3:55 PM

## 2023-12-06 ENCOUNTER — Ambulatory Visit: Payer: Self-pay | Admitting: Nurse Practitioner

## 2023-12-07 ENCOUNTER — Ambulatory Visit: Admitting: Physical Therapy

## 2023-12-07 VITALS — BP 140/72 | HR 69

## 2023-12-07 DIAGNOSIS — R2689 Other abnormalities of gait and mobility: Secondary | ICD-10-CM

## 2023-12-07 DIAGNOSIS — M6281 Muscle weakness (generalized): Secondary | ICD-10-CM

## 2023-12-07 DIAGNOSIS — R2681 Unsteadiness on feet: Secondary | ICD-10-CM

## 2023-12-07 NOTE — Therapy (Signed)
 OUTPATIENT PHYSICAL THERAPY NEURO TREATMENT   Patient Name: Chelsea Branch MRN: [id number] DOB:01/08/1966, 58 y.o., female Today's Date: 12/07/2023   PCP: Delbert Clam, MD REFERRING PROVIDER: Delbert Clam, MD  END OF SESSION:  PT End of Session - 12/07/23 1102     Visit Number 6    Number of Visits 9    Date for PT Re-Evaluation 12/22/23    Authorization Type [place] [employer]   4 visits used   PT Start Time 1101    PT Stop Time 1143    PT Time Calculation (min) 42 min    Equipment Utilized During Treatment Gait belt    Activity Tolerance Patient tolerated treatment well    Behavior During Therapy WFL for tasks assessed/performed              Past Medical History:  Diagnosis Date   Abnormal thyroid  blood test 03/15/2017   Abnormal uterine bleeding    Alopecia    Anemia    Angina pectoris with normal coronary arteriogram (HCC) 2017   Had + Troponin c/w ? 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 iniatially ?2/2 peripartum in 1994 - improved by 2008 then worsening EF in 2011 back down to EF 25-30%. b. echo 01/21/14 showed mod LVH, [name]; 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   Peripartum cardiomyopathy 1994   Sleep apnea 2015   CPAP 12/2013   Stroke (HCC) 2011   Systolic and diastolic CHF, acute on chronic (HCC) 06/14/2015   Tobacco abuse    Type 2 diabetes mellitus (HCC) 04/28/2023   Past Surgical History:  Procedure Laterality Date   CARDIAC CATHETERIZATION N/A 06/16/2015    Procedure: Left Heart Cath and Coronary Angiography;  Surgeon: Candyce GORMAN Reek, MD;  Location: [facility] INVASIVE CV LAB;  Service: Cardiovascular;  Laterality: N/A;   CESAREAN SECTION  1992  1994   INTRAUTERINE DEVICE (IUD) INSERTION     about 3 years ago   LOOP RECORDER IMPLANT  ~ 2000   spine injections      [facility],  Dr Darlis   TIBIAL TUBERCLERPLASTY     TRANSESOPHAGEAL ECHOCARDIOGRAM (CATH LAB) N/A 04/20/2023   Procedure: TRANSESOPHAGEAL ECHOCARDIOGRAM;  Surgeon: Alvan Ronal BRAVO, MD;  Location: [facility] INVASIVE CV LAB;  Service: Cardiovascular;  Laterality: N/A;   TUBAL LIGATION  05/24/1992   Patient Active Problem List   Diagnosis Date Noted   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   History of stroke with residual effects 07/06/2023   Frequent falls 07/06/2023   Type 2 diabetes mellitus (HCC) 04/28/2023   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) 04/28/2023   History of cerebellar stroke 04/13/2023   History of stroke 04/11/2023   Liletta  IUD (intrauterine device) in place since 12/15/2015 04/06/2023   Chronic pain syndrome 12/28/2021   Primary osteoarthritis of both knees 12/28/2021   Spinal stenosis of lumbar region 07/21/2021   Lumbar radiculitis 07/21/2021   COPD (chronic obstructive pulmonary disease) (HCC) 07/21/2021  Mechanical complication due to intrauterine contraceptive device 07/21/2021   Arthropathy of lumbar facet joint 03/14/2019   Degenerative disc disease at L5-S1 level 10/10/2018   Hyperthyroidism 04/26/2016   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   History of non-ST elevation myocardial infarction (NSTEMI) 06/14/2015   Homelessness 09/16/2014   Vitamin D  deficiency 01/29/2014   OSA (obstructive sleep apnea) 01/08/2014   Chronic combined systolic and diastolic CHF (congestive heart failure) (HCC) 08/14/2013   Essential hypertension    Fibroids 08/09/2012   Menorrhagia 05/08/2012   Female pattern hair loss     HLD (hyperlipidemia) 06/08/2010   CEREBRAL ANEURYSM 06/08/2010   History of cardiovascular disorder 06/08/2010    ONSET DATE: 10/24/2023 (referral)   REFERRING DIAG: I69.30 (ICD-10-CM) - History of stroke with residual effects  THERAPY DIAG:  Muscle weakness (generalized)  Unsteadiness on feet  Other abnormalities of gait and mobility  Rationale for Evaluation and Treatment: Rehabilitation  SUBJECTIVE:                                                                                                                                                                                             SUBJECTIVE STATEMENT: Pt presents w/rollator, wearing slip-on shoes today and not tripping over feet. Was not able to check her BP at home due to her batteries being dead. Denies falls or acute changes.   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  PAIN:  Are you having pain? No  PRECAUTIONS: Fall  RED FLAGS: None   WEIGHT BEARING RESTRICTIONS: No  FALLS: Has patient fallen in last 6 months? Yes. Number of falls 4-5  LIVING ENVIRONMENT: Lives with: lives with their partner Lives in: House/apartment Stairs: No Has following equipment at home: Walker - 4 wheeled  PLOF: Requires assistive device for independence  PATIENT GOALS: to walk to get my mobility like I used to   OBJECTIVE:  Note: Objective measures were completed at Evaluation unless otherwise noted.  DIAGNOSTIC FINDINGS: MRI of brain from 03/2023   IMPRESSION: Acute infarcts in the right occipital lobe, along the posterior aspect of the left insula, and in the left cerebellar hemisphere with additional tiny foci of possible acute  infarction in the left parietal lobe. Findings are suspicious for cardioembolic etiology. Trace petechial hemorrhage in the medial aspect of the right occipital lobe and posterior left insula. No significant mass  effect.  COGNITION: Overall cognitive status: Impaired   SENSATION: Pt denies numbness/tingling in BLEs   POSTURE: rounded shoulders, forward head, and increased thoracic kyphosis  LOWER EXTREMITY ROM:     Active  Right Eval Left Eval  Hip flexion    Hip extension    Hip abduction    Hip adduction    Hip internal rotation    Hip external rotation    Knee flexion    Knee extension    Ankle dorsiflexion    Ankle plantarflexion    Ankle inversion    Ankle eversion     (Blank rows = not tested)  LOWER EXTREMITY MMT:  Tested on 7/14   MMT Right  Left   Hip flexion 4- 4  Hip extension    Hip abduction 4+ 4+  Hip adduction 4+ 4+  Hip internal rotation    Hip external rotation    Knee flexion 4+ 4+  Knee extension 4+ 4+  Ankle dorsiflexion    Ankle plantarflexion    Ankle inversion    Ankle eversion    (Blank rows = not tested)  BED MOBILITY:  Not tested  TRANSFERS: Sit to stand: Modified independence  Assistive device utilized: Environmental consultant - 4 wheeled     Stand to sit: Modified independence  Assistive device utilized: Environmental consultant - 4 wheeled     Pt demonstrates heavy reliance on BUEs to perform and pushes up on rollator rather than using armrests.   RAMP:  Not tested  CURB:  Not tested  STAIRS: Not tested GAIT: Gait pattern: step through pattern, decreased stride length, lateral hip instability, trunk flexed, poor foot clearance- Right, and poor foot clearance- Left Distance walked: Various clinic distances  Assistive device utilized: Walker - 4 wheeled Level of assistance: Modified independence Comments: Pt wearing slides this date which enables her to slide her feet on the floor. Pt reports she prefers this as it is easier and does not like wearing  tennis shoes. Encouraged pt to wear closed-toe shoes as this is safer and will encouraged her to pick feet up.    VITALS  Vitals:   12/07/23 1104  BP: (!) 140/72  Pulse: 69                                                                                                        TREATMENT:   Self-care/home management/Ther Act  Assessed vitals (see above) and WNL this date   SciFit multi-peaks level 7.5 for 8 minutes using BUE/BLEs for neural priming for reciprocal movement, dynamic cardiovascular warmup and increased amplitude of stepping. RPE of 8/10 following activity.  At ballet bar, standing pallof presses w/red resistance band, x20 reps per side, for improved standing tolerance and core stability. Max verbal and tactile cues required for proper technique  In // bars, fwd and lateral 4 Hurdle navigation w/BUE support progressing to single UE support, x4 reps each direction. Pt performed well w/no tripping noted. SBA throughout. I feel it   At mat table, seated front raises w/5# dumbbells, 2x8 reps, for improved core  stability and BUE strength. Pt unable to perform w/extended elbows and required min tactile cues to reduce posterior lean  Sit to stands w/bilateral UE OHP using 5# dumbbells, 2x5 reps for improved standing balance, high amplitude movement and global strength. Pt performed well w/no instability noted but reported high levels of fatigue.  Pt inquiring about ability to return to work and walk without AD. Reminded pt that she will not be safe to return to work and her doctors have informed her of this. Also reminded pt that walking without an AD is not safe and pt is able to be more independent w/use of rollator, so therapist will continue to recommend she use it at all times for the foreseeable future. Encouraged pt to focus on more realistic goals, such as improving her walking tolerance and functional strength, as returning to work is not a realistic goal. Pt verbalized  understanding.  Discussed PT POC and informed pt we will plan to DC next week as pt is doing well and has things she can work on at home. Pt in agreement with this.     Gait pattern: step through pattern, decreased stride length, decreased hip/knee flexion- Right, decreased hip/knee flexion- Left, decreased ankle dorsiflexion- Right, decreased ankle dorsiflexion- Left, shuffling, lateral hip instability, poor foot clearance- Right, and poor foot clearance- Left Distance walked: various clinic distances  Assistive device utilized: Walker - 4 wheeled Level of assistance: SBA Comments: No catching of feet or instability noted this date    PATIENT EDUCATION: Education details: Plan to DC next week, importance of using rollator at all times, importance of setting realistic goals, continue HEP  Person educated: Patient Education method: Explanation, Demonstration, and Verbal cues Education comprehension: verbalized understanding, returned demonstration, and needs further education  HOME EXERCISE PROGRAM: Access Code: [email or web address] URL: [email or web address] Date: 11/21/2023 Prepared by: Marlon Reeder Brisby  Exercises - Sit to Stand Without Arm Support  - 1 x daily - 7 x weekly - 3 sets - 5-10 reps - Standing March with Counter Support  - 1 x daily - 7 x weekly - 2 sets - 20 reps - Standing Knee Flexion with Counter Support  - 1 x daily - 7 x weekly - 2 sets - 10 reps - Standing Hip Abduction with Unilateral Counter Support  - 1 x daily - 7 x weekly - 2 sets - 12-15 reps - Seated march over  - 1 x daily - 7 x weekly - 1-2 sets - 10-15 reps - Seated Knee Extension with Resistance  - 1 x daily - 7 x weekly - 2-3 sets - 10-12 reps  You Can Walk For A Certain Length Of Time Each Day                          Walk 15 minutes 1 time per day.             Increase 2  minutes every 7 days              Work up to 30 minutes (1-2 times per day).           GOALS: Goals reviewed with  patient? Yes  STG = LTG DUE TO POC LENGTH   LONG TERM GOALS: Target date: 12/22/2023   Pt will improve 5 x STS to less than or equal to 20 seconds w/proper hand positioning to demonstrate improved functional strength and transfer efficiency.   Baseline: 29.47s w/BUE support on rollator  Goal status: INITIAL  2.  Pt will improve gait velocity to at least 2.5 ft/s w/LRAD for improved gait efficiency and independence  Baseline: 2.29 ft/s w/rollator  Goal status: INITIAL  3.  Pt will ambulate greater than or equal to 1000 feet on with LRAD mod I for improved cardiovascular endurance and BLE strength.   Baseline: 805' w/rollator  Goal status: REVISED  4.  Pt will be independent with final HEP for improved strength, balance, transfers and gait.  Baseline: to be reviewed  Goal status: INITIAL  ASSESSMENT:  CLINICAL IMPRESSION: Emphasis of skilled PT session on functional strength, endurance and pt education regarding safety and realistic goals. Pt's BP WNL this date and no tripping over feet noted. Pt compliant w/HEP but continues to be limited by cardiovascular deconditioning and impaired cognition, often repeating the same questions each session with continued expectations to return to work despite being told no by various healthcare professionals. Continue to encourage pt to focus on realistic goals, such as walking further, as returning to work is not realistic for her. Pt in agreement to DC from PT next week and will start OT/SLP the week after. Continue POC.    OBJECTIVE IMPAIRMENTS: Abnormal gait, cardiopulmonary status limiting activity, decreased activity tolerance, decreased balance, decreased cognition, decreased endurance, decreased knowledge of use of DME, decreased mobility, decreased strength, decreased safety awareness, impaired perceived functional ability, impaired vision/preception, improper body mechanics, and pain  ACTIVITY LIMITATIONS: carrying, lifting,  squatting, stairs, transfers, hygiene/grooming, locomotion level, and caring for others  PARTICIPATION LIMITATIONS: meal prep, cleaning, interpersonal relationship, driving, shopping, community activity, occupation, and yard work  PERSONAL FACTORS: Age, Fitness, Past/current experiences, Transportation, and 1 comorbidity: history of CVA are also affecting patient's functional outcome.   REHAB POTENTIAL: Good  CLINICAL DECISION MAKING: Evolving/moderate complexity  EVALUATION COMPLEXITY: Moderate  PLAN:  PT FREQUENCY: 1-2x/week  PT DURATION: 4 weeks  PLANNED INTERVENTIONS: 97164- PT Re-evaluation, 97750- Physical Performance Testing, 97110-Therapeutic exercises, 97530- Therapeutic activity, [id number]- Neuromuscular re-education, 97535- Self Care, [id number]- Manual therapy, [id number]- Gait training, [phone]- Aquatic Therapy, [phone]- Electrical stimulation (manual), [phone] (1-2 muscles), [id number] (3+ muscles)- Dry Needling, Patient/Family education, Balance training, Stair training, Joint mobilization, Spinal mobilization, Vestibular training, and DME instructions  PLAN FOR NEXT SESSION: Monitor BP. Add to HEP as appropriate for functional BLE strength and endurance. Work on BLE strength, reaching tasks and endurance, core stability   Check all possible CPT codes: See Planned Interventions List for Planned CPT Codes    Check all conditions that are expected to impact treatment: Social determinants of health   If treatment provided at initial evaluation, no treatment charged due to lack of authorization.     Gethsemane Fischler E Derrica Sieg, PT, DPT 12/07/2023, 11:50 AM

## 2023-12-08 ENCOUNTER — Ambulatory Visit (HOSPITAL_COMMUNITY)
Admission: RE | Admit: 2023-12-08 | Discharge: 2023-12-08 | Disposition: A | Discharge status: Home / Self Care | Source: Ambulatory Visit | Attending: Cardiology | Admitting: Cardiology

## 2023-12-08 DIAGNOSIS — I420 Dilated cardiomyopathy: Secondary | ICD-10-CM

## 2023-12-08 LAB — ECHOCARDIOGRAM COMPLETE
Area-P 1/2: 3.76 cm2
MV M vel: 4.78 m/s
MV Peak grad: 91.4 mmHg
S' Lateral: 5.2 cm

## 2023-12-12 ENCOUNTER — Ambulatory Visit: Admitting: Physical Therapy

## 2023-12-14 ENCOUNTER — Ambulatory Visit: Admitting: Physical Therapy

## 2023-12-14 VITALS — BP 141/98 | HR 75

## 2023-12-14 DIAGNOSIS — R2689 Other abnormalities of gait and mobility: Secondary | ICD-10-CM

## 2023-12-14 DIAGNOSIS — M6281 Muscle weakness (generalized): Secondary | ICD-10-CM | POA: Diagnosis not present

## 2023-12-14 DIAGNOSIS — R2681 Unsteadiness on feet: Secondary | ICD-10-CM

## 2023-12-14 NOTE — Therapy (Signed)
 OUTPATIENT PHYSICAL THERAPY NEURO TREATMENT- DISCHARGE SUMMARY   Patient Name: Chelsea Branch MRN: [id number] DOB:28-May-1965, 58 y.o., female Today's Date: 12/14/2023   PCP: Delbert Clam, MD REFERRING PROVIDER: Delbert Clam, MD  PHYSICAL THERAPY DISCHARGE SUMMARY  Visits from Start of Care: 6  Current functional level related to goals / functional outcomes: Mod I w/use of rollator at all times    Remaining deficits: Decreased cardiovascular endurance, high fall risk, decreased functional strength, decreased safety awareness    Education / Equipment: HEP   Patient agrees to discharge. Patient goals were not assessed due to HTN. Patient is being discharged due to being pleased with the current functional level.   END OF SESSION:  PT End of Session - 12/14/23 1234     Visit Number 7    Number of Visits 9    Date for PT Re-Evaluation 12/22/23    Authorization Type [place] Medicaid   4 visits used   PT Start Time 1232    PT Stop Time 1247    PT Time Calculation (min) 15 min    Equipment Utilized During Treatment --    Activity Tolerance Other (comment);Treatment limited secondary to medical complications (Comment)   HTN   Behavior During Therapy [facility] for tasks assessed/performed               Past Medical History:  Diagnosis Date   Abnormal thyroid  blood test 03/15/2017   Abnormal uterine bleeding    Alopecia    Anemia    Angina pectoris with normal coronary arteriogram (HCC) 2017   Had + Troponin c/w ? 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 iniatially ?2/2 peripartum in 1994 - improved by 2008 then worsening EF in 2011 back  down to EF 25-30%. b. echo 01/21/14 showed mod LVH, [name]; 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   Peripartum cardiomyopathy 1994   Sleep apnea 2015   CPAP 12/2013   Stroke (HCC) 2011   Systolic and diastolic CHF, acute on chronic (HCC) 06/14/2015   Tobacco abuse    Type 2 diabetes mellitus (HCC) 04/28/2023   Past Surgical History:  Procedure Laterality Date   CARDIAC CATHETERIZATION N/A 06/16/2015   Procedure: Left Heart Cath and Coronary Angiography;  Surgeon: Candyce GORMAN Reek, MD;  [facility] INVAS[facility] CV LAB;  Service: Cardiovascular;  Laterality: N/A;   CESAREAN SECTION  1992  1994   INTRAUTERINE DEVICE (IUD) INSERTION     about 3 years ago   LOOP RECORDER IMPLANT  ~ 2000   spine injections      [facility],  Dr Darlis   TIBIAL TUBERCLERPLASTY     TRANSESOPHAGEAL ECHOCARDIOGRAM (CATH LAB) N/A 04/20/2023   Procedure: TRANSESOPHAGEAL ECHOCARDIOGRAM;  Surgeon: Alvan Ronal BRAVO, MD;  Location: [facility] INVASIVE CV LAB;  Service: Cardiovascular;  Laterality: N/A;   TUBAL LIGATION  05/24/1992   Patient Active Problem List   Diagnosis Date Noted   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   History of stroke  with residual effects 07/06/2023   Frequent falls 07/06/2023   Type 2 diabetes mellitus (HCC) 04/28/2023   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) 04/28/2023   History of cerebellar stroke 04/13/2023   History of stroke 04/11/2023   Liletta  IUD (intrauterine device) in place since 12/15/2015 04/06/2023   Chronic pain syndrome 12/28/2021   Primary osteoarthritis of both knees 12/28/2021   Spinal stenosis of lumbar region 07/21/2021   Lumbar radiculitis 07/21/2021   COPD (chronic obstructive pulmonary disease) (HCC) 07/21/2021   Mechanical complication due to intrauterine contraceptive device 07/21/2021    Arthropathy of lumbar facet joint 03/14/2019   Degenerative disc disease at L5-S1 level 10/10/2018   Hyperthyroidism 04/26/2016   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   History of non-ST elevation myocardial infarction (NSTEMI) 06/14/2015   Homelessness 09/16/2014   Vitamin D  deficiency 01/29/2014   OSA (obstructive sleep apnea) 01/08/2014   Chronic combined systolic and diastolic CHF (congestive heart failure) (HCC) 08/14/2013   Essential hypertension    Fibroids 08/09/2012   Menorrhagia 05/08/2012   Female pattern hair loss    HLD (hyperlipidemia) 06/08/2010   CEREBRAL ANEURYSM 06/08/2010   History of cardiovascular disorder 06/08/2010    ONSET DATE: 10/24/2023 (referral)   REFERRING DIAG: I69.30 (ICD-10-CM) - History of stroke with residual effects  THERAPY DIAG:  Muscle weakness (generalized)  Unsteadiness on feet  Other abnormalities of gait and mobility  Rationale for Evaluation and Treatment: Rehabilitation  SUBJECTIVE:                                                                                                                                                                                             SUBJECTIVE STATEMENT: Pt presents w/rollator, wearing closed-toe shoes. Reports doing well, feels ready to DC today. No falls, has been working on her HEP and walking.   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  PAIN:  Are you having pain? No  PRECAUTIONS: Fall  RED FLAGS: None   WEIGHT BEARING RESTRICTIONS: No  FALLS: Has patient fallen in last 6 months? Yes. Number of falls 4-5  LIVING ENVIRONMENT: Lives with: lives with their partner Lives in:  House/apartment Stairs: No Has following equipment at home: Vannie - 4 wheeled  PLOF: Requires assistive device for independence  PATIENT GOALS: to walk to get my mobility like I used to  OBJECTIVE:  Note: Objective measures were completed at Evaluation unless otherwise noted.  DIAGNOSTIC FINDINGS: MRI of brain from 03/2023   IMPRESSION: Acute infarcts in the right occipital lobe, along the posterior aspect of the left insula, and in the left cerebellar hemisphere with additional tiny foci of possible acute infarction in the left parietal lobe. Findings are suspicious for cardioembolic etiology. Trace petechial hemorrhage in the medial aspect of the right occipital lobe and posterior left insula. No significant mass effect.  COGNITION: Overall cognitive status: Impaired   SENSATION: Pt denies numbness/tingling in BLEs   POSTURE: rounded shoulders, forward head, and increased thoracic kyphosis  LOWER EXTREMITY ROM:     Active  Right Eval Left Eval  Hip flexion    Hip extension    Hip abduction    Hip adduction    Hip internal rotation    Hip external rotation    Knee flexion    Knee extension    Ankle dorsiflexion    Ankle plantarflexion    Ankle inversion    Ankle eversion     (Blank rows = not tested)  LOWER EXTREMITY MMT:  Tested on 7/14   MMT Right  Left   Hip flexion 4- 4  Hip extension    Hip abduction 4+ 4+  Hip adduction 4+ 4+  Hip internal rotation    Hip external rotation    Knee flexion 4+ 4+  Knee extension 4+ 4+  Ankle dorsiflexion    Ankle plantarflexion    Ankle inversion    Ankle eversion    (Blank rows = not tested)  BED MOBILITY:  Not tested  TRANSFERS: Sit to stand: Modified independence  Assistive device utilized: Environmental consultant - 4 wheeled     Stand to sit: Modified independence  Assistive device utilized: Environmental consultant - 4 wheeled     Pt demonstrates heavy reliance on BUEs to perform and pushes up on rollator rather than using  armrests.   RAMP:  Not tested  CURB:  Not tested  STAIRS: Not tested GAIT: Gait pattern: step through pattern, decreased stride length, lateral hip instability, trunk flexed, poor foot clearance- Right, and poor foot clearance- Left Distance walked: Various clinic distances  Assistive device utilized: Walker - 4 wheeled Level of assistance: Modified independence Comments: Pt wearing slides this date which enables her to slide her feet on the floor. Pt reports she prefers this as it is easier and does not like wearing tennis shoes. Encouraged pt to wear closed-toe shoes as this is safer and will encouraged her to pick feet up.    VITALS  Vitals:   12/14/23 1237 12/14/23 1239  BP: (!) 147/101 (!) 141/98  Pulse: 77 75                                                                                                         TREATMENT:   Self-care/home management Assessed vitals (see above) and diastolic BP elevated beyond limits for session. Pt reports she took her medication at 8:30 am today and ate a hot pocket last night.  Has not eaten today. States she has had some numbness of her RUE that started yesterday. Continue to educate pt on BE FAST and encouraged her to go to ED if she has any symptoms. Pt verbalized understanding.   Provided pt w/AA batteries for her home BP cuff, as she does not have any batteries at home. Pt verbalized appreciation. Encouraged her to continue checking BP at home multiple times per day and write this down in the log provided by PT. Pt verbalized understanding.  Provided new copy of pt's HEP and informed pt she may return to PT if her balance declines, pt verbalized understanding.     Gait pattern: step through pattern, decreased stride length, decreased hip/knee flexion- Right, decreased hip/knee flexion- Left, decreased ankle dorsiflexion- Right, decreased ankle dorsiflexion- Left, shuffling, lateral hip instability, poor foot clearance- Right, and poor  foot clearance- Left Distance walked: various clinic distances  Assistive device utilized: Walker - 4 wheeled Level of assistance: Modified independence Comments: No catching of feet or instability noted this date    PATIENT EDUCATION: Education details: Final HEP, see self-care above  Person educated: Patient Education method: Explanation, Demonstration, and Handouts Education comprehension: verbalized understanding  HOME EXERCISE PROGRAM: Access Code: [email or web address] URL: [email or web address] Date: 11/21/2023 Prepared by: Marlon Tiphani Mells  Exercises - Sit to Stand Without Arm Support  - 1 x daily - 7 x weekly - 3 sets - 5-10 reps - Standing March with Counter Support  - 1 x daily - 7 x weekly - 2 sets - 20 reps - Standing Knee Flexion with Counter Support  - 1 x daily - 7 x weekly - 2 sets - 10 reps - Standing Hip Abduction with Unilateral Counter Support  - 1 x daily - 7 x weekly - 2 sets - 12-15 reps - Seated march over  - 1 x daily - 7 x weekly - 1-2 sets - 10-15 reps - Seated Knee Extension with Resistance  - 1 x daily - 7 x weekly - 2-3 sets - 10-12 reps  You Can Walk For A Certain Length Of Time Each Day                          Walk 15 minutes 1 time per day.             Increase 2  minutes every 7 days              Work up to 30 minutes (1-2 times per day).           GOALS: Goals reviewed with patient? Yes  STG = LTG DUE TO POC LENGTH   LONG TERM GOALS: Target date: 12/22/2023   Pt will improve 5 x STS to less than or equal to 20 seconds w/proper hand positioning to demonstrate improved functional strength and transfer efficiency.   Baseline: 29.47s w/BUE support on rollator  Goal status: UNABLE TO ASSESS  2.  Pt will improve gait velocity to at least 2.5 ft/s w/LRAD for improved gait efficiency and independence  Baseline: 2.29 ft/s w/rollator  Goal status: UNABLE TO ASSESS  3.  Pt will ambulate greater than or equal to 1000 feet on with LRAD  mod I for improved cardiovascular endurance and BLE strength.   Baseline: 805' w/rollator  Goal status: UNABLE TO ASSESS  4.  Pt will be independent with final HEP for improved strength, balance, transfers and gait.  Baseline: to be reviewed  Goal  status: MET  ASSESSMENT:  CLINICAL IMPRESSION: Emphasis of skilled PT session on LTG assessment and DC from PT. Pt's BP elevated beyond safe limits for PT today, so unable to assess pt's goals. Encouraged pt to continue to assess her BP at home and provided her with batteries for her home cuff, as she reports she has none. Pt reports she has been working on her exercises and walking more at home and feels ready to DC today. Pt verbalized good understanding of importance of using rollator at all times and working on improved cardiovascular endurance at home in a safe way.    OBJECTIVE IMPAIRMENTS: Abnormal gait, cardiopulmonary status limiting activity, decreased activity tolerance, decreased balance, decreased cognition, decreased endurance, decreased knowledge of use of DME, decreased mobility, decreased strength, decreased safety awareness, impaired perceived functional ability, impaired vision/preception, improper body mechanics, and pain  ACTIVITY LIMITATIONS: carrying, lifting, squatting, stairs, transfers, hygiene/grooming, locomotion level, and caring for others  PARTICIPATION LIMITATIONS: meal prep, cleaning, interpersonal relationship, driving, shopping, community activity, occupation, and yard work  PERSONAL FACTORS: Age, Fitness, Past/current experiences, Transportation, and 1 comorbidity: history of CVA are also affecting patient's functional outcome.   REHAB POTENTIAL: Good  CLINICAL DECISION MAKING: Evolving/moderate complexity  EVALUATION COMPLEXITY: Moderate  PLAN:  PT FREQUENCY: 1-2x/week  PT DURATION: 4 weeks  PLANNED INTERVENTIONS: 97164- PT Re-evaluation, 97750- Physical Performance Testing, 97110-Therapeutic  exercises, 97530- Therapeutic activity, 97112- Neuromuscular re-education, 97535- Self Care, [id number]- Manual therapy, [phone]- Gait training, [phone]- Aquatic Therapy, [phone]- Electrical stimulation (manual), [phone] (1-2 muscles), [phone] (3+ muscles)- Dry Needling, Patient/Family education, Balance training, Stair training, Joint mobilization, Spinal mobilization, Vestibular training, and DME instructions   Check all possible CPT codes: See Planned Interventions List for Planned CPT Codes    Check all conditions that are expected to impact treatment: Social determinants of health   If treatment provided at initial evaluation, no treatment charged due to lack of authorization.     Luiz Trumpower E Guinn Delarosa, PT, DPT 12/14/2023, 12:51 PM

## 2023-12-15 ENCOUNTER — Telehealth: Payer: Self-pay | Admitting: Family Medicine

## 2023-12-15 DIAGNOSIS — I5042 Chronic combined systolic (congestive) and diastolic (congestive) heart failure: Secondary | ICD-10-CM

## 2023-12-15 NOTE — Progress Notes (Signed)
 Electrophysiology Office Note:   Date:  12/17/2023  ID:  Chelsea Branch, DOB 09/25/1965, MRN [id number]  Primary Cardiologist: Elspeth Sage, MD Electrophysiologist: [name], MD      History of Present Illness:   Chelsea Branch is a 58 y.o. 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  Discussed the use of AI scribe software for clinical note transcription with the patient, who gave verbal consent to proceed.  History of Present Illness Chelsea Branch is a 58 year old female with heart failure and stroke who presents for evaluation of defibrillator placement. She has a history of fluctuating heart function since 1994, coinciding with the birth of her twins. Her heart function has been monitored over the years, and a recent ultrasound showed her ejection fraction remains low at 30-35%. She experienced a stroke on November 17th of the previous year while at work, which was preceded by a severe headache. She continues to experience headaches and frequent tiredness, which she attributes to her weakened heart. Her family has a history of heart problems, including her mother who had high blood pressure and heart failure. She is currently using a walker due to falls and has been attending physical therapy. She expresses that not being able to work has taken a toll on her.  Review of systems complete and found to be negative unless listed in HPI.   EP Information / Studies Reviewed:    EKG is ordered today. Personal review as below.  EKG Interpretation Date/Time:  Friday December 16 2023 11:59:10 EDT Ventricular Rate:  72 PR Interval:  184 QRS Duration:  96 QT Interval:  406 QTC Calculation: 444 R Axis:   -24  Text Interpretation: Normal sinus rhythm Left ventricular hypertrophy with repolarization abnormality ( R in aVL ) When compared with ECG of 21-Jun-2023 10:49, No  significant change was found Confirmed by Kitty Fonda [id number]) on 12/16/2023 12:49:18 PM   Echo 12/08/23:   1. Left ventricular ejection fraction, by estimation, is 30 to 35%. The  left ventricle has moderately decreased function. The left ventricle  demonstrates global hypokinesis. The left ventricular internal cavity size  was moderately dilated. Left  ventricular diastolic parameters are consistent with Grade I diastolic  dysfunction (impaired relaxation).   2. Right ventricular systolic function is mildly reduced. The right  ventricular size is normal. Tricuspid regurgitation signal is inadequate  for assessing PA pressure.   3. Left atrial size was mild to moderately dilated.   4. The mitral valve is normal in structure. Mild mitral valve  regurgitation. No evidence of mitral stenosis.   5. The aortic valve is tricuspid. Aortic valve regurgitation is not  visualized. No aortic stenosis is present.   6. The inferior vena cava is normal in size with greater than 50%  respiratory variability, suggesting right atrial pressure of 3 mmHg.       Physical Exam:   VS:  BP 102/68   Pulse 72   Ht 5' 8 (1.727 m)   Wt 203 lb 11.2 oz (92.4 kg)   SpO2 98%   BMI 30.97 kg/m    Wt Readings from Last 3 Encounters:  12/16/23 203 lb 11.2 oz (92.4 kg)  11/28/23 203 lb 3.2 oz (92.2 kg)  10/05/23 201 lb 9.6 oz (91.4 kg)     GEN: Well nourished, well developed in no acute distress NECK: No JVD CARDIAC: Normal rate, regular rhythm RESPIRATORY:  Clear to auscultation without rales, wheezing or rhonchi  ABDOMEN: Soft, non-distended EXTREMITIES:  No edema; No deformity   ASSESSMENT AND PLAN:    #. Chronic systolic heart failure: NYHA class II.  #. NICM:  - Patient meets criteria for primary prevention ICD in the setting of LV systolic function less than 35% despite greater than 90 days of medical therapy.  She has narrow QRS.  Will plan for VVI ICD. Explained risks, benefits, and alternatives  to ICD implantation, including but not limited to bleeding, infection, damage to heart or lungs, heart attack, stroke, or death.  Pt verbalized understanding and wants to proceed. - Continue GDMT regimen of metoprolol , Entresto , spironolactone . - We will refer her to establish care with one of my heart failure colleagues, Dr. Zenaida.  #. Cryptogenic stroke: - We will monitor for AF with ICD.  No known history of atrial fibrillation.  Plan for ICD.   Follow up with Dr. Kennyth 3 months after implant.   Signed, Fonda Kennyth, MD

## 2023-12-15 NOTE — Telephone Encounter (Signed)
 Copied from CRM [id number]. Topic: Clinical - Medical Advice >> Dec 15, 2023  8:09 AM Chelsea Branch wrote:  Reason for CRM: Pt called in requesting to speak to social worker about housing, Pt would like for social worker to give her a call back to speak about options   Callback [phone]

## 2023-12-15 NOTE — Telephone Encounter (Signed)
 Call returned to patient: [phone] ,message left with call back requested.

## 2023-12-16 ENCOUNTER — Encounter: Payer: Self-pay | Admitting: Cardiology

## 2023-12-16 ENCOUNTER — Ambulatory Visit: Attending: Cardiology | Admitting: Cardiology

## 2023-12-16 ENCOUNTER — Other Ambulatory Visit: Payer: Self-pay

## 2023-12-16 ENCOUNTER — Ambulatory Visit (HOSPITAL_COMMUNITY): Admitting: Physician Assistant

## 2023-12-16 VITALS — BP 102/68 | HR 72 | Ht 68.0 in | Wt 203.7 lb

## 2023-12-16 DIAGNOSIS — I428 Other cardiomyopathies: Secondary | ICD-10-CM

## 2023-12-16 DIAGNOSIS — I639 Cerebral infarction, unspecified: Secondary | ICD-10-CM | POA: Diagnosis present

## 2023-12-16 DIAGNOSIS — I5042 Chronic combined systolic (congestive) and diastolic (congestive) heart failure: Secondary | ICD-10-CM

## 2023-12-16 NOTE — Patient Instructions (Signed)
 Medication Instructions:  Your physician recommends that you continue on your current medications as directed. Please refer to the Current Medication list given to you today.  *If you need a refill on your cardiac medications before your next appointment, please call your pharmacy*  Lab Work: BMET and CBC - please have these done at any [facility] location (anytime after August 5th)   Testing/Procedures: ICD Implant Your physician has recommended that you have a defibrillator inserted. An implantable cardioverter defibrillator (ICD) is a small device that is placed in your chest or, in rare cases, your abdomen. This device uses electrical pulses or shocks to help control life-threatening, irregular heartbeats that could lead the heart to suddenly stop beating (sudden cardiac arrest). Leads are attached to the ICD that goes into your heart. This is done in the hospital and usually requires an overnight stay. Please see the instruction sheet given to you today for more information.  Follow-Up: At [facility], you and your health needs are our priority.  As part of our continuing mission to provide you with exceptional heart care, our providers are all part of one team.  This team includes your primary Cardiologist (physician) and Advanced Practice Providers or APPs (Physician Assistants and Nurse Practitioners) who all work together to provide you with the care you need, when you need it.  Your next appointment:   We will call you to arrange your follow up appointments

## 2023-12-19 ENCOUNTER — Ambulatory Visit: Admitting: Occupational Therapy

## 2023-12-19 ENCOUNTER — Other Ambulatory Visit (HOSPITAL_COMMUNITY): Payer: Medicaid Other

## 2023-12-19 DIAGNOSIS — R41842 Visuospatial deficit: Secondary | ICD-10-CM

## 2023-12-19 DIAGNOSIS — R29818 Other symptoms and signs involving the nervous system: Secondary | ICD-10-CM

## 2023-12-19 DIAGNOSIS — M6281 Muscle weakness (generalized): Secondary | ICD-10-CM | POA: Diagnosis not present

## 2023-12-19 DIAGNOSIS — R278 Other lack of coordination: Secondary | ICD-10-CM

## 2023-12-19 NOTE — Therapy (Unsigned)
 OUTPATIENT SPEECH LANGUAGE PATHOLOGY EVALUATION   Patient Name: Chelsea Branch MRN: [id number] DOB:[birth year], 58 y.o., female Today's Date: 12/21/2023  PCP: Delbert Clam, MD REFERRING PROVIDER: Delbert Clam, MD  END OF SESSION:  End of Session - 12/20/23 1142     Visit Number 1    Number of Visits 7    Date for SLP Re-Evaluation 03/13/24    Authorization Type [employer] Medicaid - no auth required    Authorization Time Period 16/27 visits used this year    SLP Start Time 1145    SLP Stop Time  1230    SLP Time Calculation (min) 45 min    Activity Tolerance Patient tolerated treatment well          Past Medical History:  Diagnosis Date   Abnormal thyroid  blood test 03/15/2017   Abnormal uterine bleeding    Alopecia    Anemia    Angina pectoris with normal coronary arteriogram (HCC) 2017   Had + Troponin c/w ? 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 iniatially ?2/2 peripartum in 1994 - improved by 2008 then worsening EF in 2011 back down to EF 25-30%. b. echo 01/21/14 showed mod LVH, [name]; 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   Peripartum cardiomyopathy 1994   Sleep apnea 2015   CPAP 12/2013   Stroke (HCC) 2011   Systolic and diastolic CHF, acute on chronic (HCC) 06/14/2015   Tobacco abuse    Type 2 diabetes mellitus (HCC) 04/28/2023   Past Surgical History:  Procedure Laterality Date   CARDIAC CATHETERIZATION N/A 06/16/2015   Procedure: Left Heart Cath and Coronary Angiography;   Surgeon: Candyce GORMAN Reek, MD;  Location: [facility] INVASIVE CV LAB;  Service: Cardiovascular;  Laterality: N/A;   CESAREAN SECTION  1992  1994   INTRAUTERINE DEVICE (IUD) INSERTION     about 3 years ago   LOOP RECORDER IMPLANT  ~ 2000   spine injections      [facility],  Dr Darlis   TIBIAL TUBERCLERPLASTY     TRANSESOPHAGEAL ECHOCARDIOGRAM (CATH LAB) N/A 04/20/2023   Procedure: TRANSESOPHAGEAL ECHOCARDIOGRAM;  Surgeon: Alvan Ronal BRAVO, MD;  Location: [facility] INVASIVE CV LAB;  Service: Cardiovascular;  Laterality: N/A;   TUBAL LIGATION  05/24/1992   Patient Active Problem List   Diagnosis Date Noted   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   History of stroke with residual effects 07/06/2023   Frequent falls 07/06/2023   Type 2 diabetes mellitus (HCC) 04/28/2023   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) 04/28/2023   History of cerebellar stroke 04/13/2023   History of stroke 04/11/2023   Liletta  IUD (intrauterine device) in place since 12/15/2015 04/06/2023   Chronic pain syndrome 12/28/2021   Primary osteoarthritis of both knees 12/28/2021   Spinal stenosis of lumbar region 07/21/2021   Lumbar radiculitis 07/21/2021   COPD (chronic obstructive pulmonary disease) (HCC) 07/21/2021   Mechanical complication due to intrauterine contraceptive device 07/21/2021   Arthropathy of  lumbar facet joint 03/14/2019   Degenerative disc disease at L5-S1 level 10/10/2018   Hyperthyroidism 04/26/2016   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   History of non-ST elevation myocardial infarction (NSTEMI) 06/14/2015   Homelessness 09/16/2014   Vitamin D  deficiency 01/29/2014   OSA (obstructive sleep apnea) 01/08/2014   Chronic combined systolic and diastolic CHF (congestive heart failure) (HCC) 08/14/2013   Essential hypertension    Fibroids 08/09/2012   Menorrhagia 05/08/2012   Female pattern hair loss    HLD (hyperlipidemia) 06/08/2010   CEREBRAL ANEURYSM  06/08/2010   History of cardiovascular disorder 06/08/2010    ONSET DATE: 10/24/2023 (referral date)   REFERRING DIAG: I69.30 (ICD-10-CM) - History of stroke with residual effects  THERAPY DIAG:  Aphasia  Dysarthria and anarthria  Rationale for Evaluation and Treatment: Rehabilitation  SUBJECTIVE:   SUBJECTIVE STATEMENT: still off a little bit  Pt accompanied by: self  PERTINENT HISTORY: Chelsea Branch is a 58 yo female presenting to ED 11/18 with expressive aphasia, R UE weakness, and confusion. MRI Brain with acute infarcts in the acute occipital lobe, posterior aspect of L insula, and L cerebellar hemisphere. TNK administered. PMH includes prior CVA (2011), OSA, tobacco use, chronic combined CHF, HTN, NSTEMI (2017), NIDCM   PAIN: Are you having pain? No  FALLS: Has patient fallen in last 6 months?  No  LIVING ENVIRONMENT: Lives with: is homeless Lives in: House/apartment  PLOF:  Level of assistance: Independent with ADLs, Independent with IADLs Employment: On disability   PATIENT GOALS: improve speech  OBJECTIVE:  Note: Objective measures were completed at Evaluation unless otherwise noted.  COGNITION: Overall cognitive status: Within functional limits for tasks assessed Comments: denied cognitive deficits  AUDITORY COMPREHENSION: Overall auditory comprehension: Impaired: moderately complex and complex YES/NO questions: Impaired: complex Following directions: Appears intact Conversation: Simple Interfering components: processing speed and working memory Effective technique: extra processing time, repetition/stressing words, and slowed speech  READING COMPREHENSION: Reportedly Intact (some difficulty with vision on L side)   EXPRESSION: verbal  VERBAL EXPRESSION: Level of generative/spontaneous verbalization: conversation Automatic speech: name: intact, social response: impaired, and month of year: intact  Repetition: Impaired: phrase and  sentence Naming: Responsive: 51-75% and Confrontation: 51-75% Pragmatics: Appears intact Interfering components: speech intelligibility Effective technique: semantic cues, phonemic cues, and written cues Non-verbal means of communication: gestures  WRITTEN EXPRESSION: Dominant hand: right Written expression: Reportedly intact; did not assess today  MOTOR SPEECH: Overall motor speech: impaired Level of impairment: Conversation Respiration: thoracic breathing and clavicular breathing Phonation: normal Resonance: WFL Articulation: Impaired: conversation Intelligibility: Intelligibility reduced Motor planning: Impaired: inconsistent Interfering components: aphasia Effective technique: slow rate and over articulate  ORAL MOTOR EXAMINATION: Overall status: Impaired:   Labial: Left (ROM, Symmetry, and Strength) Lingual: Bilateral (Coordination) Comments: mild incoordination   STANDARDIZED ASSESSMENTS: QAB: Mild (mild decline in score since last ST evaluation)  Summary      Word comprehension 9.17  Sentence comprehension 6.25  Word finding 8.00  Grammatical construction 7.63  Speech motor programming 7.50  Repetition 7.92  Reading 8.33  QAB overall 7.80   BNT: 37/60 (mildly improved since last ST evaluation)  PATIENT REPORTED OUTCOME MEASURES (PROM): Communication Participation Item Bank: 20  The Communicative Participation Item Bank        Does your condition interfere with... Pt Rating   ...talking with people you know 2   ...communicating when you need to say something quickly 2   ...talking with people you do not know 3   ...communicating when  you are out in your community 2   ...asking questions in a conversation 1   ....communicating in a small group of people 2   ...having a long conversation 2   ...giving detailed infomrmation 2   ...getting your turn in a fast moving conversation 2   ...trying to persuade a friend or family member to see a different point  of view 2  3= Not at all; 2=A little; 1=Quite a bit; 0=Very much                                                                                                                             TREATMENT DATE:  12/20/23: evaluation only d/t payor source requirement   PATIENT EDUCATION: Education details: POC, syllable segmentation, slow rate  Person educated: Patient Education method: Explanation Education comprehension: verbalized understanding and needs further education   GOALS: Goals reviewed with patient? Yes  LONG TERM GOALS: Target date: 03/13/2024 (extended d/t scheduling)  Pt will use verbal compensations for word finding in conversation with rare min A  Baseline: limited compensation for word finding  Goal status: INITIAL  2.  Pt will complete moderately complex structured language task (discourse level) with 90% accuracy to aid in language rehabilitation  Baseline: mild aphasia Goal status: INITIAL  3.  Pt will utilize processing compensations during 15+ minute conversation in 80% of opportunities, evidenced by appropriate responses/comments  Baseline: inconsistent use of compensations Goal status: INITIAL  4.  Pt will report improved communication effectiveness via PROM by 2 points by LTG date Baseline: CPIB=20 Goal status: INITIAL   ASSESSMENT:  CLINICAL IMPRESSION: Patient is a 58 y.o. F who was seen today for ST evaluation for residual aphasia. Completed several ST sessions earlier in year but did not complete POC for unknown reason. Today, pt presents with ongoing mild aphasia and mild dysarthria c/b intermittent anomia, semantic/phonemic paraphasias, and vague speech. Occasional imprecise articulation evident. Impaired auditory comprehension also intermittently observed, with occasional compensations utilized. Pt aware of speech changes, which reportedly impact daily communication needs and social engagement. Pt would benefit from additional ST services to optimize  communication effectiveness.   OBJECTIVE IMPAIRMENTS: include aphasia. These impairments are limiting patient from effectively communicating at home and in community. Factors affecting potential to achieve goals and functional outcome are financial resources and family/community support. Patient will benefit from skilled SLP services to address above impairments and improve overall function.  REHAB POTENTIAL: Good  PLAN:  SLP FREQUENCY: 1x/week  SLP DURATION: 12 weeks (extended for scheduling)  PLANNED INTERVENTIONS: Language facilitation, Cueing hierachy, Internal/external aids, Functional tasks, Multimodal communication approach, SLP instruction and feedback, Compensatory strategies, Patient/family education, and [id number] Treatment of speech (30 or 45 min)   Check all possible CPT codes: [phone] - SLP treatment    Check all conditions that are expected to impact treatment: Social determinants of health   If treatment provided at initial evaluation, no treatment charged due to lack of authorization.  Comer LILLETTE Louder, CCC-SLP 12/21/2023, 10:05 AM

## 2023-12-19 NOTE — Therapy (Signed)
 OUTPATIENT OCCUPATIONAL THERAPY NEURO TREATMENT  Patient Name: Chelsea Branch MRN: [id number] DOB:August 17, 1965, 58 y.o., female Today's Date: 12/19/2023  PCP: Delbert Clam, MD  REFERRING PROVIDER: Delbert Clam, MD  END OF SESSION:  OT End of Session - 12/19/23 1403     Visit Number 2    Number of Visits 7    Date for OT Re-Evaluation 01/27/24    Authorization Type [employer] Medicaid - no auth required per appt notes    OT Start Time 1403    OT Stop Time 1445    OT Time Calculation (min) 42 min    Equipment Utilized During Treatment Cards    Activity Tolerance Patient tolerated treatment well    Behavior During Therapy [facility] for tasks assessed/performed          Past Medical History:  Diagnosis Date   Abnormal thyroid  blood test 03/15/2017   Abnormal uterine bleeding    Alopecia    Anemia    Angina pectoris with normal coronary arteriogram (HCC) 2017   Had + Troponin c/w ? 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 iniatially ?2/2 peripartum in 1994 - improved by 2008 then worsening EF in 2011 back down to EF 25-30%. b. echo 01/21/14 showed mod LVH, [name]; 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   Peripartum cardiomyopathy 1994   Sleep apnea 2015   CPAP 12/2013   Stroke (HCC) 2011   Systolic and diastolic CHF, acute on chronic (HCC) 06/14/2015   Tobacco abuse    Type 2 diabetes mellitus (HCC) 04/28/2023   Past Surgical History:  Procedure Laterality Date   CARDIAC CATHETERIZATION N/A  06/16/2015   Procedure: Left Heart Cath and Coronary Angiography;  Surgeon: Candyce GORMAN Reek, MD;  Location[facility] INVASIVE CV LAB;  Service: Cardiovascular;  Laterality: N/A;   CESAREAN SECTION  1992  1994   INTRAUTERINE DEVICE (IUD) INSERTION     about 3 years ago   LOOP RECORDER IMPLANT  ~ 2000   spine injections      [facility],  Dr Darlis   TIBIAL TUBERCLERPLASTY     TRANSESOPHAGEAL ECHOCARDIOGRAM (CATH LAB) N/A 04/20/2023   Procedure: TRANSESOPHAGEAL ECHOCARDIOGRAM;  Surgeon: Alvan Ronal BRAVO, MD;  Location: [facility] INVASIVE CV LAB;  Service: Cardiovascular;  Laterality: N/A;   TUBAL LIGATION  05/24/1992   Patient Active Problem List   Diagnosis Date Noted   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   History of stroke with residual effects 07/06/2023   Frequent falls 07/06/2023   Type 2 diabetes mellitus (HCC) 04/28/2023   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) 04/28/2023   History of cerebellar stroke 04/13/2023   History of stroke 04/11/2023   Liletta  IUD (intrauterine device) in place since 12/15/2015 04/06/2023   Chronic pain syndrome 12/28/2021   Primary osteoarthritis of both knees 12/28/2021   Spinal stenosis of lumbar region 07/21/2021   Lumbar radiculitis 07/21/2021   COPD (chronic obstructive pulmonary disease) (HCC) 07/21/2021   Mechanical complication  due to intrauterine contraceptive device 07/21/2021   Arthropathy of lumbar facet joint 03/14/2019   Degenerative disc disease at L5-S1 level 10/10/2018   Hyperthyroidism 04/26/2016   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   History of non-ST elevation myocardial infarction (NSTEMI) 06/14/2015   Homelessness 09/16/2014   Vitamin D  deficiency 01/29/2014   OSA (obstructive sleep apnea) 01/08/2014   Chronic combined systolic and diastolic CHF (congestive heart failure) (HCC) 08/14/2013   Essential hypertension    Fibroids 08/09/2012   Menorrhagia 05/08/2012   Female pattern  hair loss    HLD (hyperlipidemia) 06/08/2010   CEREBRAL ANEURYSM 06/08/2010   History of cardiovascular disorder 06/08/2010    ONSET DATE: 10/24/2023 (referral)   REFERRING DIAG: I69.30 (ICD-10-CM) - History of stroke with residual effects   THERAPY DIAG:  Visuospatial deficit  Other lack of coordination  Other symptoms and signs involving the nervous system  Rationale for Evaluation and Treatment: Rehabilitation  SUBJECTIVE:   SUBJECTIVE STATEMENT:  Pt reports that her Defibrillator surgery is September 4th.  Pt asked about support for housing and food as she is relying on friends at this time.  She has been told her disability is approved but she has not received any paperwork yet.  She knows there are wait lists for various programs.  Pt reports no real pain except with some constipation earlier and is encouraged to discuss with MD as she would like a prescription for something to be covered by insurance versus OOP cost.     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   PRECAUTIONS: Fall  WEIGHT BEARING RESTRICTIONS: No  PAIN:  Are you having pain? Not really - just before BM today  FALLS: Has patient fallen in last 6 months? No  LIVING ENVIRONMENT: Lives with: lives with their partner Lives in: House/apartment Stairs: No Has following equipment at home: Vannie - 4 wheeled  PLOF: Requires assistive device for independence; worked in a Chemical engineer; does not have a car to drive; likes baking and cooking; reading; Archivist; crocheting  PATIENT GOALS: To improve level of activity during the day; join gym; get into a home of her own  OBJECTIVE:  Note: Objective  measures were completed at Evaluation unless otherwise noted.  HAND DOMINANCE: Right  ADLs: Overall ADLs: mod I  Equipment: had shower chair but lost her storage unit and is homeless  IADLs:  Light housekeeping: sweeping and mopping are a little more difficult and she has to sit or take breaks between  Handwriting: pt reports her handwriting is about 10% of prior level  MOBILITY STATUS: Needs Assist: Ambulates with use of rollator  ACTIVITY TOLERANCE: Activity tolerance: fair to poor  FUNCTIONAL OUTCOME MEASURES: Quick Dash: 40.9 % disability   UPPER EXTREMITY ROM:    BUE: WNL  UPPER EXTREMITY MMT:     BUE: WFL though poor endurance  HAND FUNCTION: Grip strength: Right: 37.2 lbs; Left: 36.5 lbs  COORDINATION: 9 Hole Peg test: Right: 24 sec; Left: 25 sec  SENSATION: Paresthesias in RUE from previous work-related accident  EDEMA: mild reported and observed  MUSCLE TONE: WFL  COGNITION: Overall cognitive status: Impaired  VISION: Subjective report: Pt has difficulty seeing items on left side and has spots in vision  that are not clear. She turns head to left side to be able to see things. Baseline vision: does not have corrected lenses Visual history: Thyroid  eye disease  VISION ASSESSMENT: Reading and distance acuity: WFL  45* left peripheral vision; R peripheral vision WFL  PERCEPTION: Impaired: Spatial orientation: unable to orient to dry erase marker; reports difficulty with writing to make letters correctly  PRAXIS: Impaired: Motor planning  OBSERVATIONS: Pt ambulates with use of rollator. No loss of balance. The pt appears fairly well kept and has mask donned.                                                                                                                           TODAY'S TREATMENT :    - Self Care education completed for duration as noted below including: Pt had questions about housing and food support with information located,  printed and provided for patient.  Extensive list of Food banks provided  [email or web address]/ As well as housing  [email or web address].at [email or web address]-williams.[email or web address]/  OT educated pt on activities to encourage visual scanning and general vision strategies as noted in pt instructions to improve independence and safety with ADL and IADL completion secondary to visual impairment.  Information provided to pt in green folder and she was shown how to use folder to cue her to the L side of a page or how to use it as a line guide.  - Therapeutic activities completed for duration as noted below including:  OT educated pt on table top play of Golf Solitaire to address coordination, scanning and locating of items, processing, sequencing of unfamiliar motor movements or tasks, motor planning, and item/pattern recognition. Pt required moderate cues for proper play.  In addition, OTR provided pt with visual cue to help her pick number 1 above or below cards as played ie) A 2 3 4 5 6 7 8 9 10  J Q K.  List was provided for her to use at home. Also used task to work on cognitive tasks/calculations ie) plus 1 and minus 1 and ability to follow directions.   PATIENT EDUCATION: Education details: Building surveyor Person educated: Patient Education method: Explanation, Demonstration, Verbal cues, and Handouts Education comprehension: verbalized understanding, returned demonstration, verbal cues required, and needs further education  HOME EXERCISE PROGRAM: 12/19/23: Visual Scanning and Visual Compensation  GOALS:  SHORT TERM GOALS: Target date: 12/27/2023    Patient will demonstrate initial UE HEP with 25% verbal cues or less for proper execution. Baseline: Goal status: IN Progress  2.  Patient will independently verbalize at least 3 energy conservation principles in relation to ADLs to increase functional independence. Baseline:  Goal  status: INITIAL  3.  Patient will independently recall at least 2 compensatory strategies for visual impairment without cueing. Baseline:  Goal status: IN Progress  4.  Pt will participate in creating a daily schedule as needed to form new routines and improve overall mental health. Baseline:  Goal status: INITIAL  LONG TERM  GOALS: Target date: 01/27/2024    Patient will demonstrate UE HEP with visual handouts only for proper execution. Baseline:  Goal status: INITIAL  2.  Patient will demonstrate at least 16% improvement with quick Dash score (reporting 24.9% disability or less) indicating improved functional use of affected extremity. Baseline: 40.9 % disability Goal status: INITIAL  3.  Patient will independently recall at least 3 strategies for better sleep/sleep hygiene. Baseline:  Goal status: INITIAL  ASSESSMENT:  CLINICAL IMPRESSION: Patient is a 58 y.o. female who was seen today for occupational therapy evaluation for CVA with visual deficits and socioeconomic issues. Pt provided several resources today but would benefit from SW consultation and was encouraged to reach out to [facility] personnel to assist.  Pt responded well to visual scanning recommendations today and will benefit from further skilled OT services in the outpatient setting to work on impairments as noted at eval to help pt maximize independence and safety.    PERFORMANCE DEFICITS: in functional skills including ADLs, IADLs, coordination, sensation, edema, strength, Fine motor control, mobility, endurance, decreased knowledge of use of DME, vision, and UE functional use, cognitive skills including orientation and perception, and psychosocial skills including coping strategies, environmental adaptation, and routines and behaviors.   IMPAIRMENTS: are limiting patient from ADLs, IADLs, rest and sleep, work, and leisure.   CO-MORBIDITIES: has co-morbidities such as NICM and COPD that affects occupational  performance. Patient will benefit from skilled OT to address above impairments and improve overall function.  REHAB POTENTIAL: Good for goals stated   PLAN:  OT FREQUENCY: 1x/week  OT DURATION: 6 weeks  PLANNED INTERVENTIONS: 97168 OT Re-evaluation, 97535 self care/ADL training, 02889 therapeutic exercise, 97530 therapeutic activity, 97112 neuromuscular re-education, 97140 manual therapy, 97035 ultrasound, 97039 fluidotherapy, functional mobility training, visual/perceptual remediation/compensation, energy conservation, coping strategies training, patient/family education, and DME and/or AE instructions  RECOMMENDED OTHER SERVICES: [facility] for health management/SW  CONSULTED AND AGREED WITH PLAN OF CARE: Patient  PLAN FOR NEXT SESSION:  EC - needs handout vision strategies; sleep hygiene; basic strengthening; daily routines in place of work; [facility] referral? - pt mentioned homeless and would like to get a place of her own   Clarita LITTIE Pride, OT 12/19/2023, 4:43 PM

## 2023-12-19 NOTE — Patient Instructions (Signed)
 [facility] is an information and referral service provided by [employer] of [place]  and powered by local [employer] [place] . Families and individuals can call 2-1-1 or [phone] to receive free and confidential information on health and human services within their community.  VISUAL COMP STRATEGIES:  Activities to try at home to encourage visual scanning:   1. Word searches 2. Mazes 3. Puzzles 4. Card games 5. Computer games and/or searches 6. Connect-the-dots  Activities for environmental (larger) scanning:  1. With supervision, scan for items in grocery store or drugstore.  Begin with a familiar store, then progress to a new store you've never been in before. Make sure you have supervision with this.  2. With supervision, tell a family member or caregiver when it is safe to cross a street after looking all directions and any side streets. However, do NOT cross street unless family member or caregiver is with you and says it is OK    Visual Compensation 1. Look for the edge of objects (to the left and/or right) so that you make sure you are seeing all of an object 2. Turn your head when walking, scan from side to side, particularly in busy environments 3. Use an organized scanning pattern. It's usually easier to scan from top to bottom, and left to right (like you are reading) 4. Double check yourself 5. Use a line guide (like a blank piece of paper) or your finger when reading 6. If necessary, place brightly colored tape at end of table or work area as a reminder to always look until you see the tape.

## 2023-12-20 ENCOUNTER — Ambulatory Visit

## 2023-12-20 DIAGNOSIS — M6281 Muscle weakness (generalized): Secondary | ICD-10-CM | POA: Diagnosis not present

## 2023-12-20 DIAGNOSIS — R471 Dysarthria and anarthria: Secondary | ICD-10-CM

## 2023-12-20 DIAGNOSIS — R4701 Aphasia: Secondary | ICD-10-CM

## 2023-12-20 NOTE — Telephone Encounter (Signed)
 The patient called in returning a call to Chelsea Branch. I teams hollace Bradley and she said she will call the patient back. I informed the patient and she said ok and thank you.

## 2023-12-20 NOTE — Telephone Encounter (Signed)
 I spoke to the patient and she explained that she is in need of housing.  She has been staying with friends, or a motel, just going from here to there.   She said she has contacted the [employer] and she owes them about $4000 before she would be eligible for housing.  She called [employer] and she said they don't have anything for her    She is still waiting for an award letter noting her approval for SSI and said she plans to pay $700-900/month rent. In the meantime, she has no consistent income.   I told her that I can refer her to Aloysius Ford, RN/Congregational Nurse and ask that she call her to see if she has any resources.  I also said I would send a referral to [facility] for additional resources and the patient was in agreement.  She is also interested in any funding that is available to help pay the money owed to [place].    As we continued to speak, the call was dropped. I waited a few minutes and called her back and had to leave a message.

## 2023-12-20 NOTE — Addendum Note (Signed)
 Addended by: Anabella Capshaw on: 12/20/2023 06:52 PM   Modules accepted: Orders

## 2023-12-20 NOTE — Telephone Encounter (Signed)
 I tried to reach the patient again : [phone] ,and had to leave a message requesting a call back.

## 2023-12-21 ENCOUNTER — Telehealth: Payer: Self-pay

## 2023-12-21 ENCOUNTER — Telehealth: Payer: Self-pay | Admitting: *Deleted

## 2023-12-21 ENCOUNTER — Other Ambulatory Visit: Payer: Self-pay

## 2023-12-21 NOTE — Telephone Encounter (Signed)
>>   Dec 21, 2023  8:49 AM Chelsea Branch wrote:  Pls call patient about going over questions - lost call yesterday.. see chart.. still wanted to discuss housing etc.

## 2023-12-21 NOTE — Progress Notes (Signed)
 Complex Care Management Note  Care Guide Note 12/21/2023 Name: Chelsea Branch MRN: [id number] DOB: June 19, 1965  Chelsea Branch is a 58 y.o. year old female who sees Delbert Clam, MD for primary care. I reached out to [name] Rumble by phone today to offer complex care management services.  [name] was given information about Complex Care Management services today including:   The Complex Care Management services include support from the care team which includes your Nurse Care Manager, Clinical Social Worker, or Pharmacist.  The Complex Care Management team is here to help remove barriers to the health concerns and goals most important to you. Complex Care Management services are voluntary, and the patient may decline or stop services at any time by request to their care team member.   Complex Care Management Consent Status: Patient agreed to services and verbal consent obtained.   Follow up plan:  Telephone appointment with complex care management team member scheduled for:  8/1 with BSW and 8/7 with RNCM   Encounter Outcome:  Patient Scheduled  [name]  [facility]  [facility], [facility] Guide  Direct Dial: [phone]  Fax [phone]

## 2023-12-21 NOTE — Telephone Encounter (Signed)
 Mailed updated Instruction letter to pt.

## 2023-12-21 NOTE — Telephone Encounter (Signed)
 Call returned to patient, I had to leave a message requesting a call back.

## 2023-12-23 ENCOUNTER — Other Ambulatory Visit: Payer: Self-pay

## 2023-12-23 NOTE — Patient Outreach (Signed)
 Complex Care Management   Visit Note  12/23/2023  Name:  Chelsea Branch MRN: [id number] DOB: Sep 10, 1965  Situation: Referral received for Complex Care Management related to SDOH Barriers:  Housing   Food insecurity Financial Resource Strain I obtained verbal consent from Patient.  Visit completed with patient  on the phone  Background:   Past Medical History:  Diagnosis Date   Abnormal thyroid  blood test 03/15/2017   Abnormal uterine bleeding    Alopecia    Anemia    Angina pectoris with normal coronary arteriogram (HCC) 2017   Had + Troponin c/w ? 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 iniatially ?2/2 peripartum in 1994 - improved by 2008 then worsening EF in 2011 back down to EF 25-30%. b. echo 01/21/14 showed mod LVH, [name]; 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   Peripartum cardiomyopathy 1994   Sleep apnea 2015   CPAP 12/2013   Stroke (HCC) 2011   Systolic and diastolic CHF, acute on chronic (HCC) 06/14/2015   Tobacco abuse    Type 2 diabetes mellitus (HCC) 04/28/2023    Assessment:  BSW contacted Chelsea Branch for an initial call to assess for SDOH barriers. During the call, the following barriers were identified: housing instability, food insecurity, transportation limitations, and financial strain. Chelsea Branch reported that she is currently staying with friends and family while awaiting her SSI benfits which she expects to begin receiving by the end of the month. BSW offered Complex Care  Management services; however, patient declined as she is already scheduled for follow-up with the RN Case Manager [facility]). BSW provided [name] with relevant resources addressing her current needs and informed her of available community supports. BSW will follow up with Ms. Chelsea Branch on January 26, 2024, to reassess needs and provide continued support as appropriate.   SDOH Interventions Today    Flowsheet Row Most Recent Value  SDOH Interventions   Food Insecurity Interventions Community Resources Provided  Housing Interventions Community Resources Provided  Transportation Interventions Community Resources Provided  Financial Strain Interventions Community Resources Provided      Recommendation:   No recommendations at this time  Follow Up Plan:   Telephone follow-up 01/26/2024 @ 10am  Chelsea Branch, BSW [facility]  [facility] Social Worker, [employer] [phone]

## 2023-12-23 NOTE — Patient Instructions (Signed)
 Visit Information  Thank you for taking time to visit with me today. Please don't hesitate to contact me if I can be of assistance to you before our next scheduled appointment.  Our next appointment is by telephone on 01/26/2024 at 10am Please call the care guide team at [phone] if you need to cancel or reschedule your appointment.     Please call the Suicide and Crisis Lifeline: 988 call the [place]  National Suicide Prevention Lifeline: 207-333-0174 or TTY: (239)196-4751 TTY 762-808-6743) to talk to a trained counselor call 1-800-273-TALK (toll free, 24 hour hotline) go to [facility] [address] [place] [phone]) call 911 if you are experiencing a Mental Health or Behavioral Health Crisis or need someone to talk to.  Patient verbalizes understanding of instructions and care plan provided today and agrees to view in MyChart. Active MyChart status and patient understanding of how to access instructions and care plan via MyChart confirmed with patient.     Orlean Fey, BSW [place]  [facility] Social Worker, [employer] [facility] [phone]

## 2023-12-26 ENCOUNTER — Other Ambulatory Visit: Payer: Self-pay

## 2023-12-27 ENCOUNTER — Ambulatory Visit: Attending: Critical Care Medicine | Admitting: Occupational Therapy

## 2023-12-27 DIAGNOSIS — R29818 Other symptoms and signs involving the nervous system: Secondary | ICD-10-CM | POA: Insufficient documentation

## 2023-12-27 DIAGNOSIS — R41844 Frontal lobe and executive function deficit: Secondary | ICD-10-CM | POA: Diagnosis present

## 2023-12-27 DIAGNOSIS — R278 Other lack of coordination: Secondary | ICD-10-CM | POA: Diagnosis present

## 2023-12-27 DIAGNOSIS — R29898 Other symptoms and signs involving the musculoskeletal system: Secondary | ICD-10-CM | POA: Diagnosis present

## 2023-12-27 DIAGNOSIS — M6281 Muscle weakness (generalized): Secondary | ICD-10-CM | POA: Insufficient documentation

## 2023-12-27 DIAGNOSIS — R41840 Attention and concentration deficit: Secondary | ICD-10-CM | POA: Diagnosis present

## 2023-12-27 DIAGNOSIS — R41842 Visuospatial deficit: Secondary | ICD-10-CM | POA: Diagnosis present

## 2023-12-27 DIAGNOSIS — R208 Other disturbances of skin sensation: Secondary | ICD-10-CM | POA: Diagnosis present

## 2023-12-27 NOTE — Therapy (Signed)
 OUTPATIENT OCCUPATIONAL THERAPY NEURO TREATMENT  Patient Name: Chelsea Branch MRN: [id number] DOB:04-Apr-1966, 58 y.o., female Today's Date: 12/27/2023  PCP: Delbert Clam, MD  REFERRING PROVIDER: Delbert Clam, MD  END OF SESSION:  OT End of Session - 12/27/23 1239     Visit Number 3    Number of Visits 7    Date for OT Re-Evaluation 01/27/24    Authorization Type U[employer] Medicaid - no auth required per appt notes    OT Start Time 1237    OT Stop Time 1316    OT Time Calculation (min) 39 min    Activity Tolerance Patient tolerated treatment well    Behavior During Therapy [facility] for tasks assessed/performed          Past Medical History:  Diagnosis Date   Abnormal thyroid  blood test 03/15/2017   Abnormal uterine bleeding    Alopecia    Anemia    Angina pectoris with normal coronary arteriogram (HCC) 2017   Had + Troponin c/w ? 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 iniatially ?2/2 peripartum in 1994 - improved by 2008 then worsening EF in 2011 back down to EF 25-30%. b. echo 01/21/14 showed mod LVH, [name]; 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   Peripartum cardiomyopathy 1994   Sleep apnea 2015   CPAP 12/2013   Stroke (HCC) 2011   Systolic and diastolic CHF, acute on chronic (HCC) 06/14/2015   Tobacco abuse    Type 2 diabetes mellitus (HCC) 04/28/2023   Past Surgical History:  Procedure Laterality Date   CARDIAC CATHETERIZATION N/A 06/16/2015   Procedure: Left Heart Cath and Coronary  Angiography;  Surgeon: Candyce GORMAN Reek, MD;  Location: [facility] INVASIVE CV LAB;  Service: Cardiovascular;  Laterality: N/A;   CESAREAN SECTION  1992  1994   INTRAUTERINE DEVICE (IUD) INSERTION     about 3 years ago   LOOP RECORDER IMPLANT  ~ 2000   spine injections      [facility],  Dr Darlis   TIBIAL TUBERCLERPLASTY     TRANSESOPHAGEAL ECHOCARDIOGRAM (CATH LAB) N/A 04/20/2023   Procedure: TRANSESOPHAGEAL ECHOCARDIOGRAM;  Surgeon: Alvan Ronal BRAVO, MD;  Location: [facility] INVASIVE CV LAB;  Service: Cardiovascular;  Laterality: N/A;   TUBAL LIGATION  05/24/1992   Patient Active Problem List   Diagnosis Date Noted   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   History of stroke with residual effects 07/06/2023   Frequent falls 07/06/2023   Type 2 diabetes mellitus (HCC) 04/28/2023   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) 04/28/2023   History of cerebellar stroke 04/13/2023   History of stroke 04/11/2023   Liletta  IUD (intrauterine device) in place since 12/15/2015 04/06/2023   Chronic pain syndrome 12/28/2021   Primary osteoarthritis of both knees 12/28/2021   Spinal stenosis of lumbar region 07/21/2021   Lumbar radiculitis 07/21/2021   COPD (chronic obstructive pulmonary disease) (HCC) 07/21/2021   Mechanical complication due to intrauterine contraceptive device 07/21/2021  Arthropathy of lumbar facet joint 03/14/2019   Degenerative disc disease at L5-S1 level 10/10/2018   Hyperthyroidism 04/26/2016   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   History of non-ST elevation myocardial infarction (NSTEMI) 06/14/2015   Homelessness 09/16/2014   Vitamin D  deficiency 01/29/2014   OSA (obstructive sleep apnea) 01/08/2014   Chronic combined systolic and diastolic CHF (congestive heart failure) (HCC) 08/14/2013   Essential hypertension    Fibroids 08/09/2012   Menorrhagia 05/08/2012   Female pattern hair loss    HLD (hyperlipidemia) 06/08/2010    CEREBRAL ANEURYSM 06/08/2010   History of cardiovascular disorder 06/08/2010    ONSET DATE: 10/24/2023 (referral)   REFERRING DIAG: I69.30 (ICD-10-CM) - History of stroke with residual effects   THERAPY DIAG:  Visuospatial deficit  Other lack of coordination  Other symptoms and signs involving the nervous system  Muscle weakness (generalized)  Rationale for Evaluation and Treatment: Rehabilitation  SUBJECTIVE:   SUBJECTIVE STATEMENT:  Pt reports appointment with social services on Friday. She is unsure if this is related to [facility].   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   PRECAUTIONS: Fall  WEIGHT BEARING RESTRICTIONS: No  PAIN:  Are you having pain? Not really - just before BM today  FALLS: Has patient fallen in last 6 months? No  LIVING ENVIRONMENT: Lives with: lives with their partner Lives in: House/apartment Stairs: No Has following equipment at home: Vannie - 4 wheeled  PLOF: Requires assistive device for independence; worked in a Chemical engineer; does not have a car to drive; likes baking and cooking; reading; Archivist; crocheting  PATIENT GOALS: To improve level of activity during the day; join gym; get into a home of her own  OBJECTIVE:  Note: Objective measures were completed at Evaluation unless otherwise noted.  HAND DOMINANCE: Right  ADLs: Overall ADLs: mod I  Equipment: had shower chair but lost her storage unit and is homeless  IADLs:  Light housekeeping: sweeping and mopping are a little more difficult and she has to sit or take breaks between  Handwriting: pt reports her handwriting is about 10% of prior  level  MOBILITY STATUS: Needs Assist: Ambulates with use of rollator  ACTIVITY TOLERANCE: Activity tolerance: fair to poor  FUNCTIONAL OUTCOME MEASURES: Quick Dash: 40.9 % disability   UPPER EXTREMITY ROM:    BUE: WNL  UPPER EXTREMITY MMT:     BUE: WFL though poor endurance  HAND FUNCTION: Grip strength: Right: 37.2 lbs; Left: 36.5 lbs  COORDINATION: 9 Hole Peg test: Right: 24 sec; Left: 25 sec  SENSATION: Paresthesias in RUE from previous work-related accident  EDEMA: mild reported and observed  MUSCLE TONE: WFL  COGNITION: Overall cognitive status: Impaired  VISION: Subjective report: Pt has difficulty seeing items on left side and has spots in vision that are not clear. She turns head to left side to be able to see things. Baseline vision: does not have corrected lenses Visual history: Thyroid  eye disease  VISION ASSESSMENT: Reading and distance acuity: WFL  45* left peripheral vision; R peripheral vision WFL  PERCEPTION: Impaired: Spatial orientation: unable to orient to dry erase marker; reports difficulty with writing to make letters correctly  PRAXIS: Impaired: Motor planning  OBSERVATIONS: Pt ambulates  with use of rollator. No loss of balance. The pt appears fairly well kept and has mask donned.                                                                                                                           TODAY'S TREATMENT :    - Self Care education completed for duration as noted below including:  OT educated patient on the 4 Ps of energy conservation including positioning, pace, prioritizing, and planning to maximize efficiency and safety with completion of functional activities as desired.  Patient verbalized understanding of all information provided and was given a packet to take home for review.  - Therapeutic exercises completed for duration as noted below including: OT initiated RUE and LUE red theraputty exercises (search, grip, and  pinch) as noted in patient instructions for coordination and strength  - Therapeutic activities completed for duration as noted below including:  OT reviewed table top play of Golf Solitaire to address coordination, scanning and locating of items, processing, sequencing of unfamiliar motor movements or tasks, motor planning, and item/pattern recognition. Pt required min cues for proper play.    PATIENT EDUCATION: Education details: putty HEP; EC strategies; Manufacturing systems engineer Person educated: Patient Education method: Programmer, multimedia, Demonstration, Verbal cues, and Handouts Education comprehension: verbalized understanding, returned demonstration, verbal cues required, and needs further education  HOME EXERCISE PROGRAM: 12/19/23: Visual Scanning and Visual Compensation 12/27/2023: golf solitaire; EC strategies; putty HEP  GOALS:  SHORT TERM GOALS: Target date: 12/27/2023    Patient will demonstrate initial UE HEP with 25% verbal cues or less for proper execution. Baseline: Goal status: IN Progress  2.  Patient will independently verbalize at least 3 energy conservation principles in relation to ADLs to increase functional independence. Baseline:  Goal status: INITIAL  3.  Patient will independently recall at least 2 compensatory strategies for visual impairment without cueing. Baseline:  Goal status: IN Progress  4.  Pt will participate in creating a daily schedule as needed to form new routines and improve overall mental health. Baseline:  Goal status: INITIAL  LONG TERM GOALS: Target date: 01/27/2024    Patient will demonstrate UE HEP with visual handouts only for proper execution. Baseline:  Goal status: INITIAL  2.  Patient will demonstrate at least 16% improvement with quick Dash score (reporting 24.9% disability or less) indicating improved functional use of affected extremity. Baseline: 40.9 % disability Goal status: INITIAL  3.  Patient will independently recall at least 3  strategies for better sleep/sleep hygiene. Baseline:  Goal status: INITIAL  ASSESSMENT:  CLINICAL IMPRESSION: Patient demonstrates and verbalizes good understanding of HEP and handouts. She appears to be establishing support systems to improve overall wellbeing. Will continue to monitor needs.   PERFORMANCE DEFICITS: in functional skills including ADLs, IADLs, coordination, sensation, edema, strength, Fine motor control, mobility, endurance, decreased knowledge of use of DME, vision, and UE functional use, cognitive skills including orientation and perception, and psychosocial skills including coping  strategies, environmental adaptation, and routines and behaviors.   IMPAIRMENTS: are limiting patient from ADLs, IADLs, rest and sleep, work, and leisure.   CO-MORBIDITIES: has co-morbidities such as NICM and COPD that affects occupational performance. Patient will benefit from skilled OT to address above impairments and improve overall function.  REHAB POTENTIAL: Good for goals stated  PLAN:  OT FREQUENCY: 1x/week  OT DURATION: 6 weeks  PLANNED INTERVENTIONS: 97168 OT Re-evaluation, 97535 self care/ADL training, 02889 therapeutic exercise, 97530 therapeutic activity, 97112 neuromuscular re-education, 97140 manual therapy, 97035 ultrasound, 97039 fluidotherapy, functional mobility training, visual/perceptual remediation/compensation, energy conservation, coping strategies training, patient/family education, and DME and/or AE instructions  RECOMMENDED OTHER SERVICES: [facility] for health management/SW  CONSULTED AND AGREED WITH PLAN OF CARE: Patient  PLAN FOR NEXT SESSION:  vision strategies; sleep hygiene; basic strengthening (review putty); daily routines in place of work; [facility] update?  Jocelyn CHRISTELLA Bottom, OT 12/27/2023, 3:46 PM

## 2023-12-27 NOTE — Patient Instructions (Addendum)
1    Energy Conservation  What is Patent attorney?  After being in the hospital, it is normal to feel tired and weak. You may also feel short of breath and have less energy to do the activities you are used to doing at home. Learning how to conserve your energy helps you build up your strength to take part in your daily activities and other things you enjoy doing.  When you learn to conserve energy, you also reduce strain on your heart, fatigue, shortness of breath and stress related pain.  Learning to conserve your energy is all about finding a good balance between work, rest and leisure in order to decrease the amount of energy demand on your body.   Remember and Practice the 4 Ps  1. Prioritize  Decide what needs to be done today and what can be done at a later date or time. For example, going to a doctor's appointment would take priority over dusting the living room.  When you have more than one thing to do, begin with the most important to make sure it gets done.   2. Plan  Plan your activities first to avoid extra trips. Gather the supplies and  equipment you need before doing the job. For example, get your  garden supplies and tools ready before you start to plant flowers.  Plan to alternate heavy and light tasks.  Plan activities throughout the week to avoid doing too many activities in one day. Put a schedule on the refrigerator to remind you and others who is doing what.  Plan to get a good rest each night.  Use family and friends or pay for help to complete tasks you may struggle with or that require too much energy.   3. Pace  Maintain a slow and steady pace. Never rush.  2   3. Pace (continued)  Rest often. Rest before you feel tired.  Avoid holding your breath. Practice breathing slow and steady.  Use pursed lip breathing. Breathe in through your nose for a count of 2 and out from your mouth for a count of 4. This is like blowing out a candle on a cake.  Remember  that you may have to ask for help to do some tasks and that is okay.  Listen to your body and know your limits.   4. Position  Too much bending and reaching can cause fatigue and shortness of breath. Use a reacher, sock aid, long handled shoe horn and/or  elastic shoelaces. Avoid bending and reaching too much.  Always maintain a nice upright posture when sitting and  standing. This helps you get more oxygen into your lungs and  around your body to work better.  Sit when you can. Sitting supports your body so you can focus  on your breathing and activities while conserving your energy.  Sitting reduces energy use by 25%.   Energy Conservation Tips  Dressing and Hygiene  Sit when you can.  Organize and lay out clothing the night before.  Begin dressing your lower half first as this uses more energy.  Avoid bending and reaching. Instead, use a reacher, sock aid  or long handled shoe horn or lift your legs up onto the bed or chair.  Dry off with terry cloth robe. You use less energy than drying off with a towel.  If you have a weaker limb or limbs, it is easier to dress the weaker limb first. It is easier to undress your  strong limb first.  Wear clothes that are easy to put on and take off. For example, use clothes and shoes with velcro instead of small buttons, clasps or laces.  3    Avoid using scented products such as hair products and lotions. These can irritate your lungs and cause shortness of breath for you and others around you. Many people are allergic to scents. These types of products are not allowed in the hospital.  Be cautious when bathing. Use warm, not hot water. This helps eliminate shortness of breath from a buildup of steam and condensation.  Use the bathroom equipment suggested by your Occupational Therapist. For example using a bath bench, bath stool, grab bars or a raised toilet seat can make bathing and toileting easier and safer.   Shopping  Bring a prepared list  of things you need to buy.  Organize your shopping list by aisle or section of the  store.  Transport items in a buggy or shopping cart rather than  carrying them in a basket.  Load and carry grocery bags that are only half full or shop with someone who can help pack and carry bags.  Avoid going out during rush hour when stores and streets are crowded.  Consider using a delivery service.   Housework  Divide activities and do them throughout the week. Balance light with heavy tasks.  Make one side of the bed at a time. Sit to change pillow cases and unfold linen.  Avoid spray cleaners that may irritate your lungs.  Clean the bathtub by sitting or kneeling.  Clean one whole room at a time instead of going back and forth between rooms to do each job.  Consider asking for help from family members or  hiring a cleaning service or housekeeper.  After washing dishes, allow them to air dry.  Have work in front of you rather than at your side.  Slide rather than lift objects.  Use long handled dustpans and cleaning sponges to decrease the need for bending.  4    Make a weekly plan for major jobs such as laundry, cleaning and  changing sheets on beds. Do one job each day.  Keep a trash can in every room to avoid too much walking.  Buy more than one of each item you use around the house.  For example, keep sink cleaner in the bathroom and kitchen.  Keep a vacuum on each level of your home.   Cooking  Cook and bake in steps to reduce energy use.  Gather all ingredients and utensils before starting.  Plan ahead with meal preparation.  Make large meals and freeze in servings for later use.  Use lightweight cookware and dishes to conserve energy.  Use paper plates and cups to eliminate dishwashing.  Use electric appliances such as can openers, blenders, food processors and dishwasher to conserve energy.  Consider buying easy to prepare or frozen meals, or using a meal delivery service.    Key Points:  Prioritize activities of the day. Do heavier tasks when you have more energy.  Plan your days' and weeks' activities. Set up your work area so you do not have to move around a lot looking for items to complete the task. Plan rest times.  Pace yourself. Do not try to complete the whole task in one session. Break it into smaller, easy to do steps. A good guide to follow is to take 10 minutes each hour to rest. Do not rush.  Position and Posture are important. Sit to work when you can to use 25% less energy. Sit and stand as upright as you can. Practice deep breathing exercises while you work to maintain your breathing rate and stay relaxed.  Use assistive devices when recommended to save energy and make it more comfortable and easy taking care of yourself.   Remember.  The most important energy conservation tip is to listen to your body.  Stop and rest BEFORE you get tired. Plan rest times. Rest often.   PD 8278 (Rev 03-2012) File: peyles

## 2023-12-29 ENCOUNTER — Other Ambulatory Visit: Payer: Self-pay

## 2023-12-29 ENCOUNTER — Telehealth: Admitting: *Deleted

## 2023-12-29 ENCOUNTER — Telehealth: Payer: Self-pay

## 2023-12-29 MED ORDER — POLYETHYLENE GLYCOL 3350 17 GM/SCOOP PO POWD: 17.0000 g | Freq: Every day | ORAL | 1 refills | Status: DC

## 2023-12-29 NOTE — Telephone Encounter (Signed)
 Copied from CRM [id number]. Topic: Clinical - Medication Question >> Dec 29, 2023  1:09 PM Emylou G wrote: Reason for CRM: patient having trouble with constipation.. really hard.. for about a week.. what medication can she take?

## 2023-12-29 NOTE — Telephone Encounter (Signed)
Patient was called and informed of medication being sent.

## 2023-12-29 NOTE — Telephone Encounter (Signed)
 Prescription for MiraLAX  has been sent to her pharmacy

## 2023-12-29 NOTE — Addendum Note (Signed)
 Addended by: Courage Biglow on: 12/29/2023 04:47 PM   Modules accepted: Orders

## 2023-12-29 NOTE — Congregational Nurse Program (Signed)
 RN received a referral from [facility] [place], to see if RN could connect client to services related to housing assistance. RN was able to refer to [facility] Case Managers to see if client could receive housing support. RN also offered spiritual encouragement during phone conversation. No further needs at this time.

## 2023-12-30 ENCOUNTER — Other Ambulatory Visit: Payer: Self-pay | Admitting: *Deleted

## 2023-12-30 ENCOUNTER — Telehealth: Payer: Self-pay | Admitting: Family Medicine

## 2023-12-30 ENCOUNTER — Other Ambulatory Visit: Payer: Self-pay

## 2023-12-30 MED ORDER — POLYETHYLENE GLYCOL 3350 17 GM/SCOOP PO POWD
ORAL | 1 refills | Status: AC
  Filled 2023-12-30: qty 510, 30d supply, fill #0

## 2023-12-30 NOTE — Patient Outreach (Addendum)
 Complex Care Management   Visit Note  12/30/2023  Name:  Chelsea Branch MRN: [id number] DOB: 1965/12/22  Situation: Referral received for Complex Care Management related to Heart Failure I obtained verbal consent from Patient.  Visit completed with patient  on the phone  Background:   Past Medical History:  Diagnosis Date   Abnormal thyroid  blood test 03/15/2017   Abnormal uterine bleeding    Alopecia    Anemia    Angina pectoris with normal coronary arteriogram (HCC) 2017   Had + Troponin c/w ? 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 iniatially ?2/2 peripartum in 1994 - improved by 2008 then worsening EF in 2011 back down to EF 25-30%. b. echo 01/21/14 showed mod LVH, [name]; 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   Peripartum cardiomyopathy 1994   Sleep apnea 2015   CPAP 12/2013   Stroke (HCC) 2011   Systolic and diastolic CHF, acute on chronic (HCC) 06/14/2015   Tobacco abuse    Type 2 diabetes mellitus (HCC) 04/28/2023    Assessment: Patient Reported Symptoms:  Cognitive Cognitive Status: Alert and oriented to person, place, and time, Able to follow simple commands, Normal speech and language skills      Neurological Neurological Review of Symptoms: No symptoms reported    HEENT HEENT Symptoms Reported: No symptoms reported      Cardiovascular Cardiovascular Symptoms Reported: No symptoms reported Cardiovascular Management Strategies: Medication therapy, Adequate rest Weight: 200 lb (90.7  kg) (Patient reports over a month ago for this reading)  Respiratory Respiratory Symptoms Reported: No symptoms reported Respiratory Management Strategies: Medication therapy, Coping strategies, Breathing techniques, Adequate rest  Endocrine Is patient diabetic?: Yes Is patient checking blood sugars at home?: No (Patient reports she was on Jardiance  but provider informed her to stop this medication and follow up with her primary provider. A1C 10/05/2023 was 6.7-patient pending appointment on 01/04/2024)    Gastrointestinal Gastrointestinal Symptoms Reported: Constipation (Patient reports she will pick up her new prescription for Glycolox for her constipation.) Gastrointestinal Management Strategies: Medication therapy, Nutrition support, Activity    Genitourinary Genitourinary Symptoms Reported: No symptoms reported    Integumentary Integumentary Symptoms Reported: No symptoms reported    Musculoskeletal Musculoskelatal Symptoms Reviewed: No symptoms reported        Psychosocial Psychosocial Symptoms Reported: No symptoms reported   Techniques to Cope with Loss/Stress/Change: Exercise (pending application for the [employer] from her OT services) Quality of Family Relationships: helpful, supportive Do you feel physically threatened by others?: No      12/30/2023   11:09 AM  Depression screen PHQ 2/9  Decreased Interest 1  Down, Depressed, Hopeless 1  PHQ - 2 Score 2  Altered sleeping 3  Tired, decreased energy 1  Change in appetite 0  Feeling bad or failure about yourself  1  Trouble concentrating 1  Moving slowly or fidgety/restless 1  Suicidal thoughts 0  PHQ-9 Score 9  Difficult doing work/chores Somewhat difficult  There were no vitals filed for this visit.  Medications Reviewed Today     Reviewed by Alvia Olam BIRCH, RN (Registered Nurse) on 12/30/23 at 1049  Med List Status: <None>   Medication Order Taking? Sig Documenting Provider Last Dose Status Informant   acetaminophen  (TYLENOL ) 500 MG tablet [id number] Yes Take 1,000 mg by mouth as needed for mild pain (pain score 1-3) or moderate pain (pain score 4-6). [provider]  Active Self, Pharmacy Records  albuterol  (VENTOLIN  HFA) 108 [id number] Base) MCG/ACT inhaler [id number] Yes Inhale 2 puffs into the lungs every 4 (four) hours as needed for wheezing or shortness of breath. Newlin, Enobong, MD  Active   amoxicillin -clavulanate (AUGMENTIN ) 875-125 MG tablet [id number]  Take 1 tablet by mouth every 12 (twelve) hours.  Patient not taking: Reported on 12/30/2023   Raspet, Erin K, PA-C  Active   aspirin  81 MG chewable tablet [id number] Yes Chew 1 tablet (81 mg total) by mouth daily. Lenard Calin, MD  Active   atorvastatin  (LIPITOR) 40 MG tablet [id number] Yes Take 1 tablet (40 mg total) by mouth daily. Newlin, Enobong, MD  Active   benzonatate  (TESSALON ) 100 MG capsule [id number] Yes Take 1 capsule (100 mg total) by mouth every 8 (eight) hours. Raspet, Erin K, PA-C  Active   FLUoxetine  (PROZAC ) 20 MG capsule [id number] Yes Take 1 capsule (20 mg total) by mouth daily. Newlin, Enobong, MD  Active   fluticasone  (FLONASE ) 50 MCG/ACT nasal spray [id number] Yes Place 2 sprays into both nostrils daily. Rising, Asberry, PA-C  Active Self, Pharmacy Records  hydrOXYzine  (ATARAX ) 25 MG tablet [id number] Yes Take 1 tablet (25 mg total) by mouth at bedtime as needed. Newlin, Enobong, MD  Active   ketoconazole  (NIZORAL ) 2 % shampoo [id number] Yes Apply 1 Application topically 2 (two) times a week. Theotis Haze ORN, NP  Active   Melatonin 10 MG TABS [id number] Yes Take 10 mg by mouth at bedtime. Theotis Haze ORN, NP  Active   methimazole  (TAPAZOLE ) 5 MG tablet [id number] Yes Take 1 tablet (5 mg total) by mouth 2 (two) times daily. Newlin, Enobong, MD  Active   metoprolol  succinate (TOPROL -XL) 50 MG 24 hr tablet [id number] Yes Take 1.5 tablets (75 mg total) by mouth daily. Take with or immediately following a meal. Thukkani, Arun K,  MD  Active   Misc. Devices MISC [id number] Yes Blood Pressure Monitor Delbert Clam, MD  Active   Polyethylene Glycol 4500 POWD [id number]  17 g by Does not apply route daily at 6 (six) AM. Dissolve 1 capful of powder in one 8 ounce glass of water. Repeat daily or as needed to soften stool Rising, Asberry RIGGERS  Active Self, Pharmacy Records           Med Note (CRUTHIS, SHEFFIELD BROCKS   Tue Apr 12, 2023  8:59 AM)    polyethylene glycol powder (GLYCOLAX /MIRALAX ) 17 GM/SCOOP powder [id number] Yes Take 17 g by mouth daily. Newlin, Enobong, MD  Active   potassium chloride  SA (KLOR-CON  M20) 20 MEQ tablet [id number] Yes Take 2 tablets (40 mEq total) by mouth daily. Newlin, Enobong, MD  Active   sacubitril -valsartan  (ENTRESTO ) 49-51 MG [id number] Yes Take 1 tablet by mouth 2 (two) times daily. Thukkani, Arun K, MD  Active   spironolactone  (ALDACTONE ) 25 MG tablet [id number] Yes Take 1 tablet (25 mg total) by mouth daily. Thukkani, Arun K, MD  Active   torsemide  (DEMADEX ) 20 MG tablet [id number] Yes Take 1 tablet (20 mg total) by mouth  daily. Newlin, Enobong, MD  Active   triamcinolone  cream (KENALOG ) 0.1 % [id number] Yes Apply 1 Application topically 2 (two) times daily. Newlin, Enobong, MD  Active   Med List Note Beverlee Reyes LELON Bishop 04/09/17 2221):              Recommendation:   PCP Follow-up Continue Current Plan of Care  Follow Up Plan:   Telephone follow up appointment date/time:  01/27/2024 1:30 pm   Olam Ku, RN, BSN [place]  [facility], [facility] RN Care Manager Direct Dial: [phone]  Fax: [phone]

## 2023-12-30 NOTE — Patient Outreach (Signed)
 RNCM spoke with patient for the scheduled appointment. Patient indicated she was awaiting for social security representative to call and requested a call back later this morning.  RNCM will rescheduled a call back at 1030 and follow up accordingly.   Olam Ku, RN, BSN [place]  [facility], [facility] RN Care Manager Direct Dial: [phone]  Fax: [phone]

## 2023-12-30 NOTE — Telephone Encounter (Signed)
 Call placed to patient and she was informed that we have a scale her in office. She agreed to come pick the scale up from the office.

## 2023-12-30 NOTE — Telephone Encounter (Signed)
 Copied from CRM #[id number]. Topic: General - Other >> Dec 30, 2023 12:10 PM Fonda T wrote: Reason for CRM: Received call from patient, requsting a return call to discuss getting a personal weigh scale, to keep record of weight gain regarding congestive heart failure.  Patient can be reached at [phone].

## 2023-12-30 NOTE — Patient Instructions (Signed)
 Visit Information  Ms. Chelsea Branch was given information about Medicaid Managed Care team care coordination services as a part of their [facility] Community Plan Medicaid benefit.   If you would like to schedule transportation through your [facility], please call the following number at least 2 days in advance of your appointment: [phone]   Rides for urgent appointments can also be made after hours by calling Member Services.  Call the Behavioral Health Crisis Line at [phone], at any time, 24 hours a day, 7 days a week. If you are in danger or need immediate medical attention call 911.   Ms. Chelsea Branch - following are the goals we discussed in your visit today:   Goals Addressed             This Visit's Progress    VBCI RN Care Plan-CHF       Problems:  Chronic Disease Management support and education needs related to CHF/DM Knowledge Deficits related to CHF/DM Diabetic with a last read on A1c 6.7 10/05/2023. Pt reports she was informed to stop her Jardiance  and follow up with her primary provider. Patient is currently not monitoring her diabetes or taking any related medications for maintenance.  Goal: Over the next 90 days the Patient will continue to work with Medical illustrator and/or Social Worker to address care management and care coordination needs related to CHF as evidenced by adherence to care management team scheduled appointments     demonstrate Improved adherence to prescribed treatment plan for CHF as evidenced by patient reporting and chart review demonstrate Improved health management independence as evidenced by patient reporting and chart review       demonstrate understanding of rationale for each prescribed medication as evidenced by patient reporting and chart review    take all medications exactly as prescribed and will call provider for medication related questions as evidenced by patient reporting and chart review  Patient aware if she  is taking her medication differently then what has been prescribed to notify her provider with such changes. verbalize basic understanding of CHF disease process and self health management plan as evidenced by patient reporting of two symptoms related to CHF work with social work on [employer] as evidenced by review of electronic medical record and patient or care team member report   Patient encouraged to follow lowering her dietary intake on sugar limiting drinks high in sugar and dietary monitoring with low carbohydrates related to her diabetes.  Interventions:   Heart Failure Interventions: Basic overview and discussion of pathophysiology of Heart Failure reviewed Provided education on low sodium diet Assessed need for readable accurate scales in home Provided education about placing scale on hard, flat surface Advised patient to weigh each morning after emptying bladder Discussed importance of daily weight and advised patient to weigh and record daily Reviewed role of diuretics in prevention of fluid overload and management of heart failure; Discussed the importance of keeping all appointments with provider Provided patient with education about the role of exercise in the management of heart failure Advised patient to discuss obtaining a scale with provider Screening for signs and symptoms of depression related to chronic disease state  Assessed social determinant of health barriers  RNCM will notify patient's primary provider concerning pt's lab on her A1C (diabetes)for any interventions or recommendations to follow up. Educated pt to lower her dietary intake on sugar limiting drinks high in sugar and dietary monitoring with low carbohydrates. Report to her provider she has  increase fatigue/weakness, thrist/hunger, dizziness,blurred vision, or increase in urination educating pt that this is the YELLOW for action with her diabetes if encountered.   Patient Self-Care Activities:  Attend all  scheduled provider appointments Attend church or other social activities Call provider office for new concerns or questions  Perform all self care activities independently  Perform IADL's (shopping, preparing meals, housekeeping, managing finances) independently Take medications as prescribed   Work with the social worker to address care coordination needs and will continue to work with the clinical team to address health care and disease management related needs call office if I gain more than 2 pounds in one day or 5 pounds in one week do ankle pumps when sitting keep legs up while sitting use salt in moderation watch for swelling in feet, ankles and legs every day weigh myself daily or as often as possible based upon her week appointments at providers offices. Requested pt to document all readings and present to other providers as needed. bring diary to all appointments develop a rescue plan follow rescue plan if symptoms flare-up eat more whole grains, fruits and vegetables, lean meats and healthy fats track symptoms and what helps feel better or worse dress right for the weather, hot or cold Encouraged pt to call the contact number on her insurance card to inquired on possible benefits for obtaining a scale for her HF management. Report to her provider any educated symptoms of increase fatigue/weakness, thrist/hunger, dizziness, and blurred vision that maybe related to her diabetes.  Plan:  Telephone follow up appointment with care management team member scheduled for:  01/27/2024 @ 1:30 pm RNCM will follow up with primary concerning patient diabetes management             The patient verbalized understanding of instructions, educational materials, and care plan provided today and DECLINED offer to receive copy of patient instructions, educational materials, and care plan.   Next PCP appointment:   Telephone follow up appointment with Managed Medicaid care management team  member scheduled for:   Olam Ku, RN, BSN [place]  [facility], [facility] Health RN Care Manager Direct Dial: [phone]  Fax: [phone]    Following is a copy of your plan of care:

## 2024-01-02 ENCOUNTER — Ambulatory Visit: Admitting: Occupational Therapy

## 2024-01-02 DIAGNOSIS — R29818 Other symptoms and signs involving the nervous system: Secondary | ICD-10-CM

## 2024-01-02 DIAGNOSIS — M6281 Muscle weakness (generalized): Secondary | ICD-10-CM

## 2024-01-02 DIAGNOSIS — R41842 Visuospatial deficit: Secondary | ICD-10-CM | POA: Diagnosis not present

## 2024-01-02 DIAGNOSIS — R208 Other disturbances of skin sensation: Secondary | ICD-10-CM

## 2024-01-02 DIAGNOSIS — R29898 Other symptoms and signs involving the musculoskeletal system: Secondary | ICD-10-CM

## 2024-01-02 DIAGNOSIS — R278 Other lack of coordination: Secondary | ICD-10-CM

## 2024-01-02 NOTE — Patient Instructions (Signed)
??   Sleep Hygiene Handout ?? What Is Sleep Hygiene? Sleep hygiene refers to healthy habits and practices that help improve the quality, duration, and consistency of your sleep. Good sleep hygiene supports mental, emotional, and physical health.  ? Tips for Better Sleep Hygiene 1. Stick to a Consistent Sleep Schedule Go to bed and wake up at the same time every day--even on weekends. Helps regulate your body's internal clock. 2. Create a Relaxing Bedtime Routine Try calming activities before bed (e.g., reading, gentle stretching, meditation). Avoid stressful conversations or work tasks right before sleep. Take a bath or shower. 3. Limit Exposure to Light at Night Dim the lights in the evening. Avoid screens (phones, TVs, computers) at least 1 hour before bed. Use blue light filters if necessary. 4. Make Your Sleep Environment Comfortable Keep your bedroom dark, quiet, and cool  Use blackout curtains, white noise machines, or earplugs if needed. Invest in a comfortable mattress and pillows. Change your bedsheets and make your bed Practice proper sleep positioning Incorporate smells that you enjoy/help you relax like lavender, peppermint, etc. (lotion, bath/beauty products, essential oils/diffuser) 5. Watch What You Eat & Drink Avoid large meals, caffeine, and alcohol close to bedtime. Try to finish eating and drinking fluids at least 2-3 hours before bed.   6. Get Regular Physical Activity Exercise during the day can help you fall asleep faster and enjoy deeper sleep. (complete therapy exercises, walking, go to the gym) Avoid intense workouts late in the evening. 7. Limit Naps Keep naps short (20-30 minutes). Avoid napping late in the afternoon or evening.  ?? Habits That Interfere with Sleep Using your bed for work, watching TV, or eating. Staying in bed when you can't fall asleep (if you're awake for 20+ minutes, get up and do something relaxing in dim light).  ??? Bonus Tips  for Relaxation Try breathing exercises, progressive muscle relaxation, or guided meditation. Apps like Calm, Headspace, or Insight Timer can be helpful.  ?? When to Seek Help Consider talking to a doctor or sleep specialist if: You regularly have trouble falling or staying asleep. You snore loudly or gasp for air during sleep. You feel very sleepy during the day despite adequate sleep.  After a brain injury, sleep becomes especially important for brain recovery and overall health. Here's what the experts recommend: General Recommendation: Most adults are advised to get 7-9 hours of sleep per night. Post-Brain Injury Needs: These individuals often need more sleep than they did before injury, especially during the early stages of recovery.

## 2024-01-02 NOTE — Therapy (Signed)
 OUTPATIENT OCCUPATIONAL THERAPY NEURO TREATMENT  Patient Name: Chelsea Branch MRN: [id number] DOB:[birth year], 58 y.o., female Today's Date: 01/02/2024  PCP: Delbert Clam, MD  REFERRING PROVIDER: Delbert Clam, MD  END OF SESSION:  OT End of Session - 01/02/24 1237     Visit Number 4    Number of Visits 7    Date for OT Re-Evaluation 01/27/24    Authorization Type [employer] - no auth required per appt notes    OT Start Time 1235    OT Stop Time 1315    OT Time Calculation (min) 40 min    Activity Tolerance Patient tolerated treatment well    Behavior During Therapy [facility] for tasks assessed/performed         Past Medical History:  Diagnosis Date   Abnormal thyroid  blood test 03/15/2017   Abnormal uterine bleeding    Alopecia    Anemia    Angina pectoris with normal coronary arteriogram (HCC) 2017   Had + Troponin c/w ? 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 iniatially ?2/2 peripartum in 1994 - improved by 2008 then worsening EF in 2011 back down to EF 25-30%. b. echo 01/21/14 showed mod LVH, [name]; 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   Peripartum cardiomyopathy 1994   Sleep apnea 2015   CPAP 12/2013   Stroke (HCC) 2011   Systolic and diastolic CHF, acute on chronic (HCC) 06/14/2015   Tobacco abuse    Type 2 diabetes mellitus (HCC) 04/28/2023   Past Surgical History:  Procedure Laterality Date   CARDIAC CATHETERIZATION N/A 06/16/2015   Procedure: Left Heart Cath and Coronary  Angiography;  Surgeon: Candyce GORMAN Reek, MD;  Location: [facility] INVASIVE CV LAB;  Service: Cardiovascular;  Laterality: N/A;   CESAREAN SECTION  1992  1994   INTRAUTERINE DEVICE (IUD) INSERTION     about 3 years ago   LOOP RECORDER IMPLANT  ~ 2000   spine injections      [facility],  Dr Darlis   TIBIAL TUBERCLERPLASTY     TRANSESOPHAGEAL ECHOCARDIOGRAM (CATH LAB) N/A 04/20/2023   Procedure: TRANSESOPHAGEAL ECHOCARDIOGRAM;  Surgeon: Alvan Ronal BRAVO, MD;  Location: [facility] INVASIVE CV LAB;  Service: Cardiovascular;  Laterality: N/A;   TUBAL LIGATION  05/24/1992   Patient Active Problem List   Diagnosis Date Noted   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   History of stroke with residual effects 07/06/2023   Frequent falls 07/06/2023   Type 2 diabetes mellitus (HCC) 04/28/2023   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) 04/28/2023   History of cerebellar stroke 04/13/2023   History of stroke 04/11/2023   Liletta  IUD (intrauterine device) in place since 12/15/2015 04/06/2023   Chronic pain syndrome 12/28/2021   Primary osteoarthritis of both knees 12/28/2021   Spinal stenosis of lumbar region 07/21/2021   Lumbar radiculitis 07/21/2021   COPD (chronic obstructive pulmonary disease) (HCC) 07/21/2021   Mechanical complication due to intrauterine contraceptive device 07/21/2021   Arthropathy  of lumbar facet joint 03/14/2019   Degenerative disc disease at L5-S1 level 10/10/2018   Hyperthyroidism 04/26/2016   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   History of non-ST elevation myocardial infarction (NSTEMI) 06/14/2015   Homelessness 09/16/2014   Vitamin D  deficiency 01/29/2014   OSA (obstructive sleep apnea) 01/08/2014   Chronic combined systolic and diastolic CHF (congestive heart failure) (HCC) 08/14/2013   Essential hypertension    Fibroids 08/09/2012   Menorrhagia 05/08/2012   Female pattern hair loss    HLD (hyperlipidemia) 06/08/2010    CEREBRAL ANEURYSM 06/08/2010   History of cardiovascular disorder 06/08/2010    ONSET DATE: 10/24/2023 (referral)   REFERRING DIAG: I69.30 (ICD-10-CM) - History of stroke with residual effects   THERAPY DIAG:  Other lack of coordination  Other symptoms and signs involving the nervous system  Muscle weakness (generalized)  Visuospatial deficit  Other disturbances of skin sensation  Other symptoms and signs involving the musculoskeletal system  Rationale for Evaluation and Treatment: Rehabilitation  SUBJECTIVE:   SUBJECTIVE STATEMENT:  Pt reports appointment with social services on Thursday.  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   PRECAUTIONS: Fall  WEIGHT BEARING RESTRICTIONS: No  PAIN:  Are you having pain? Low back pain - just before BM today, reports she is constipated  FALLS: Has patient fallen in last 6 months? No  LIVING ENVIRONMENT: Lives with: lives with their partner Lives in: House/apartment Stairs: No Has following equipment at home: Vannie - 4 wheeled  PLOF: Requires assistive device for independence; worked in a Chemical engineer; does not have a car to drive; likes baking and cooking; reading; Archivist; crocheting  PATIENT GOALS: To improve level of activity during the day; join gym; get into a home of her own  OBJECTIVE:  Note: Objective measures were completed at Evaluation unless otherwise noted.  HAND DOMINANCE: Right  ADLs: Overall ADLs: mod I  Equipment: had shower chair but lost her storage unit and is homeless  IADLs:  Light housekeeping: sweeping and mopping are a little more difficult and she has to sit or take breaks  between  Handwriting: pt reports her handwriting is about 10% of prior level  MOBILITY STATUS: Needs Assist: Ambulates with use of rollator  ACTIVITY TOLERANCE: Activity tolerance: fair to poor  FUNCTIONAL OUTCOME MEASURES: Quick Dash: 40.9 % disability   UPPER EXTREMITY ROM:    BUE: WNL  UPPER EXTREMITY MMT:     BUE: WFL though poor endurance  HAND FUNCTION: Grip strength: Right: 37.2 lbs; Left: 36.5 lbs  COORDINATION: 9 Hole Peg test: Right: 24 sec; Left: 25 sec  SENSATION: Paresthesias in RUE from previous work-related accident  EDEMA: mild reported and observed  MUSCLE TONE: WFL  COGNITION: Overall cognitive status: Impaired  VISION: Subjective report: Pt has difficulty seeing items on left side and has spots in vision that are not clear. She turns head to left side to be able to see things. Baseline vision: does not have corrected lenses Visual history: Thyroid  eye disease  VISION ASSESSMENT: Reading and distance acuity: WFL  45* left peripheral vision; R peripheral vision WFL  PERCEPTION: Impaired: Spatial orientation: unable to orient to dry erase marker; reports difficulty with writing to make letters correctly  PRAXIS: Impaired:  Motor planning  OBSERVATIONS: Pt ambulates with use of rollator. No loss of balance. The pt appears fairly well kept and has mask donned.                                                                                                                           TODAY'S TREATMENT :    - Self Care education completed for duration as noted below including:  OT reviewed the 4 Ps of energy conservation including positioning, pace, prioritizing, and planning to maximize efficiency and safety with completion of functional activities as desired.  Patient unable to recall more than 1 strategy without cueing.  OT educated pt on key sleep hygiene strategies as noted in pt instructions, including: Maintaining a consistent sleep/wake  schedule Creating a quiet, dark, and cool sleep environment Avoiding screens and stimulants before bedtime Making sleep environment comfortable Managing food and fluid intake to reduce nighttime awakenings Getting regular physical activity/limiting naps Reducing bad sleep habits while incorporating better sleep habits Tips for relaxation When to seek additional help Discussed stroke-specific considerations (e.g., increased sleep needs, safety with nighttime mobility)   OT reviewed vision strategies as noted in pt instructions.  Patient unable to recall more than 1 strategy without cueing and 1 additional strategy with cues.  - Therapeutic exercises completed for duration as noted below including: OT reviewed  RUE and LUE red theraputty exercises (search, grip, and pinch) as noted in patient instructions for coordination and strength. She did not require cues for proper completion.  PATIENT EDUCATION: Education details: putty HEP; EC strategies; Manufacturing systems engineer Person educated: Patient Education method: Programmer, multimedia, Demonstration, Verbal cues, and Handouts Education comprehension: verbalized understanding, returned demonstration, verbal cues required, and needs further education  HOME EXERCISE PROGRAM: 12/19/23: Visual Scanning and Visual Compensation 12/27/2023: golf solitaire; EC strategies; putty HEP 01/02/2024: sleep hygiene  GOALS:  SHORT TERM GOALS: Target date: 12/27/2023    Patient will demonstrate initial UE HEP with 25% verbal cues or less for proper execution. Baseline: Goal status: MET  2.  Patient will independently verbalize at least 3 energy conservation principles in relation to ADLs to increase functional independence. Baseline:  Goal status: IN PROGRESS  3.  Patient will independently recall at least 2 compensatory strategies for visual impairment without cueing. Baseline:  Goal status: IN Progress  4.  Pt will participate in creating a daily schedule as needed to  form new routines and improve overall mental health. Baseline:  Goal status: INITIAL  LONG TERM GOALS: Target date: 01/27/2024    Patient will demonstrate UE HEP with visual handouts only for proper execution. Baseline:  Goal status: INITIAL  2.  Patient will demonstrate at least 16% improvement with quick Dash score (reporting 24.9% disability or less) indicating improved functional use of affected extremity. Baseline: 40.9 % disability Goal status: INITIAL  3.  Patient will independently recall at least 3 strategies for better sleep/sleep hygiene. Baseline:  Goal status: IN PROGRESS  ASSESSMENT:  CLINICAL  IMPRESSION: Patient requiring cues for recall of handouts. She will require additional review to promote carryover and progression towards goals. Sleep disturbance likely related to poor sleep habits and inconsistent bedtime routine. Patient may benefit from structured sleep hygiene education and behavioral strategies.   PERFORMANCE DEFICITS: in functional skills including ADLs, IADLs, coordination, sensation, edema, strength, Fine motor control, mobility, endurance, decreased knowledge of use of DME, vision, and UE functional use, cognitive skills including orientation and perception, and psychosocial skills including coping strategies, environmental adaptation, and routines and behaviors.   IMPAIRMENTS: are limiting patient from ADLs, IADLs, rest and sleep, work, and leisure.   CO-MORBIDITIES: has co-morbidities such as NICM and COPD that affects occupational performance. Patient will benefit from skilled OT to address above impairments and improve overall function.  REHAB POTENTIAL: Good for goals stated  PLAN:  OT FREQUENCY: 1x/week  OT DURATION: 6 weeks  PLANNED INTERVENTIONS: 97168 OT Re-evaluation, 97535 self care/ADL training, 02889 therapeutic exercise, 97530 therapeutic activity, 97112 neuromuscular re-education, 97140 manual therapy, 97035 ultrasound, 97039  fluidotherapy, functional mobility training, visual/perceptual remediation/compensation, energy conservation, coping strategies training, patient/family education, and DME and/or AE instructions  RECOMMENDED OTHER SERVICES: [facility] for health management/SW  CONSULTED AND AGREED WITH PLAN OF CARE: Patient  PLAN FOR NEXT SESSION:  Review and update goals for vision strategies, sleep hygiene, and EC strategies; basic strengthening (has putty); daily routines in place of work; social services update?  Jocelyn CHRISTELLA Bottom, OT 01/02/2024, 1:48 PM

## 2024-01-03 ENCOUNTER — Encounter (HOSPITAL_COMMUNITY): Payer: Self-pay

## 2024-01-03 ENCOUNTER — Telehealth: Payer: Self-pay | Admitting: Family Medicine

## 2024-01-03 ENCOUNTER — Ambulatory Visit (HOSPITAL_COMMUNITY): Admitting: Clinical

## 2024-01-03 NOTE — Telephone Encounter (Signed)
 Confirmed appt for 8/13

## 2024-01-04 ENCOUNTER — Other Ambulatory Visit: Payer: Self-pay

## 2024-01-04 ENCOUNTER — Ambulatory Visit: Attending: Family Medicine | Admitting: Family Medicine

## 2024-01-04 ENCOUNTER — Encounter: Payer: Self-pay | Admitting: Family Medicine

## 2024-01-04 VITALS — BP 147/88 | HR 71 | Ht 68.0 in | Wt 209.0 lb

## 2024-01-04 DIAGNOSIS — I6930 Unspecified sequelae of cerebral infarction: Secondary | ICD-10-CM | POA: Diagnosis not present

## 2024-01-04 DIAGNOSIS — I5042 Chronic combined systolic (congestive) and diastolic (congestive) heart failure: Secondary | ICD-10-CM

## 2024-01-04 DIAGNOSIS — F320 Major depressive disorder, single episode, mild: Secondary | ICD-10-CM | POA: Diagnosis not present

## 2024-01-04 DIAGNOSIS — Z9189 Other specified personal risk factors, not elsewhere classified: Secondary | ICD-10-CM

## 2024-01-04 DIAGNOSIS — E876 Hypokalemia: Secondary | ICD-10-CM | POA: Diagnosis not present

## 2024-01-04 DIAGNOSIS — E1169 Type 2 diabetes mellitus with other specified complication: Secondary | ICD-10-CM | POA: Diagnosis not present

## 2024-01-04 DIAGNOSIS — R21 Rash and other nonspecific skin eruption: Secondary | ICD-10-CM

## 2024-01-04 DIAGNOSIS — I110 Hypertensive heart disease with heart failure: Secondary | ICD-10-CM

## 2024-01-04 DIAGNOSIS — E0590 Thyrotoxicosis, unspecified without thyrotoxic crisis or storm: Secondary | ICD-10-CM | POA: Diagnosis not present

## 2024-01-04 LAB — POCT GLYCOSYLATED HEMOGLOBIN (HGB A1C): HbA1c, POC (controlled diabetic range): 7.4 % — AB (ref 0.0–7.0)

## 2024-01-04 MED ORDER — TRIAMCINOLONE ACETONIDE 0.1 % EX CREA
1.0000 | TOPICAL_CREAM | Freq: Two times a day (BID) | CUTANEOUS | 0 refills | Status: AC
  Filled 2024-01-04 (×3): qty 30, 15d supply, fill #0

## 2024-01-04 MED ORDER — OZEMPIC (0.25 OR 0.5 MG/DOSE) 2 MG/3ML ~~LOC~~ SOPN
0.5000 mg | PEN_INJECTOR | SUBCUTANEOUS | 0 refills | Status: DC
Start: 2024-01-04 — End: 2024-01-04

## 2024-01-04 MED ORDER — OZEMPIC (0.25 OR 0.5 MG/DOSE) 2 MG/3ML ~~LOC~~ SOPN
0.2500 mg | PEN_INJECTOR | SUBCUTANEOUS | 0 refills | Status: DC
  Filled 2024-01-04: qty 3, 42d supply, fill #0
  Filled 2024-01-04: qty 3, 28d supply, fill #0

## 2024-01-04 MED ORDER — SEMAGLUTIDE (1 MG/DOSE) 4 MG/3ML ~~LOC~~ SOPN
1.0000 mg | PEN_INJECTOR | SUBCUTANEOUS | 3 refills | Status: DC
Start: 2024-01-04 — End: 2024-01-04

## 2024-01-04 MED ORDER — SEMAGLUTIDE (1 MG/DOSE) 4 MG/3ML ~~LOC~~ SOPN
1.0000 mg | PEN_INJECTOR | SUBCUTANEOUS | 3 refills | Status: DC
  Filled 2024-01-04 – 2024-04-23 (×3): qty 3, 28d supply, fill #0

## 2024-01-04 MED ORDER — OZEMPIC (0.25 OR 0.5 MG/DOSE) 2 MG/1.5ML ~~LOC~~ SOPN
0.2500 mg | PEN_INJECTOR | SUBCUTANEOUS | 0 refills | Status: DC
Start: 2024-01-04 — End: 2024-01-04

## 2024-01-04 MED ORDER — OZEMPIC (0.25 OR 0.5 MG/DOSE) 2 MG/3ML ~~LOC~~ SOPN
0.5000 mg | PEN_INJECTOR | SUBCUTANEOUS | 0 refills | Status: DC
  Filled 2024-01-04 (×2): qty 3, fill #0
  Filled 2024-03-05: qty 3, 28d supply, fill #0

## 2024-01-04 MED ORDER — TRIAMCINOLONE ACETONIDE 0.1 % EX CREA: 1.0000 | TOPICAL_CREAM | Freq: Two times a day (BID) | CUTANEOUS | 0 refills | Status: DC

## 2024-01-04 NOTE — Progress Notes (Signed)
 Subjective:  [name], female    DOB: 27-Aug-1965  Age: 58 y.o. MRN: [id number]  CC: Medical Management of Chronic Issues (Discuss upcoming heart procedure/Discuss if she can take vitamins for energy/Dental referral)     Discussed the use of AI scribe software for clinical note transcription with the patient, who gave verbal consent to proceed.  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 who presents for follow-up on her medical conditions and medication management.  She has a history of stroke affecting her left eye region, mobility, and speech. She is undergoing speech therapy and rehabilitation. She experiences visual difficulties when turning her head and uses a walker due to weakness and fatigue. She is experiencing depression and takes fluoxetine  for anxiety and depression, and hydroxyzine  for sleep.  She is concerned about an upcoming procedure to implant a defibrillator. Her heart function reveals  an ejection fraction of 30-35%. She takes atorvastatin  for cholesterol, Entresto , and spironolactone  for cardiac management. She also takes potassium pills twice a day and is due for blood work to monitor her potassium levels.  She has diabetes and previously took Jardiance  but stopped due to a severe allergic reaction. Her last A1c was 6.7%, but she has not been taking Jardiance  for 2-3 months.A1c is 7.4 up from 6.7 previously.  She is on methimazole  for hyperthyroidism, which she initially stopped due to weight gain concerns but has resumed taking as prescribed. She takes 5 mg twice a day. She reports weight gain since starting the medication and is  interested in weight loss options.  She experiences constipation and uses ibuprofen , paraffin, and Aleve  occasionally for leg and back pain. She reports a history of hard stools and difficulty with bowel movements.  She is due for a dental cleaning, which was postponed due to her stroke. She brushes regularly and plans to reschedule her appointment.  She inquires about taking vitamins for energy and asks about receiving her flu shot, which she typically gets in September, and confirms that her pneumonia vaccination is up to date.    Past Medical History:  Diagnosis Date   Abnormal thyroid  blood test 03/15/2017   Abnormal uterine bleeding    Alopecia    Anemia    Angina pectoris with normal coronary arteriogram (HCC) 2017   Had + Troponin c/w ? 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 iniatially ?2/2 peripartum in 1994 - improved by 2008 then worsening EF in 2011 back down to EF 25-30%. b. echo 01/21/14 showed mod LVH, [name]; 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   Peripartum cardiomyopathy 1994   Sleep apnea 2015   CPAP 12/2013  Stroke [facility]) 2011   Systolic and diastolic CHF, acute on chronic (HCC) 06/14/2015   Tobacco abuse    Type 2 diabetes mellitus (HCC) 04/28/2023    Past Surgical History:  Procedure Laterality Date   CARDIAC CATHETERIZATION N/A 06/16/2015   Procedure: Left Heart Cath and Coronary Angiography;  Surgeon: Candyce GORMAN Reek, MD;  Location: [facility] INVASIVE CV LAB;  Service:  Cardiovascular;  Laterality: N/A;   CESAREAN SECTION  1992  1994   INTRAUTERINE DEVICE (IUD) INSERTION     about 3 years ago   LOOP RECORDER IMPLANT  ~ 2000   spine injections      [facility],  Dr Darlis   TIBIAL TUBERCLERPLASTY     TRANSESOPHAGEAL ECHOCARDIOGRAM (CATH LAB) N/A 04/20/2023   Procedure: TRANSESOPHAGEAL ECHOCARDIOGRAM;  Surgeon: Alvan Ronal BRAVO, MD;  Location: [facility] INVASIVE CV LAB;  Service: Cardiovascular;  Laterality: N/A;   TUBAL LIGATION  05/24/1992    Family History  Problem Relation Age of Onset   Healthy Mother    Stroke Sister    Hypertension Sister    Cancer Maternal Grandmother        uterine   Other Neg Hx    Heart disease Neg Hx     Social History   Socioeconomic History   Marital status: Single    Spouse name: Not on file   Number of children: 3   Years of education: 12   Highest education level: Not on file  Occupational History   Occupation: unemployed  Tobacco Use   Smoking status: Former    Current packs/day: 0.00    Average packs/day: 0.1 packs/day for 30.0 years (3.0 ttl pk-yrs)    Types: Cigarettes    Start date: 06/02/1985    Quit date: 06/03/2015    Years since quitting: 8.5   Smokeless tobacco: Never   Tobacco comments:    Pt. stated she stopped smoking a year ago. 09/29/2018  Vaping Use   Vaping status: Never Used  Substance and Sexual Activity   Alcohol use: No    Alcohol/week: 0.0 standard drinks of alcohol   Drug use: No   Sexual activity: Yes    Birth control/protection: I.U.D.    Comment: fibroids  Other Topics Concern   Not on file  Social History Narrative   Lives at home with 59 yo twins Chelsea Branch and Chelsea Branch)   38 yo son lives outside the home   66 yo granddaughter       Update 07/12/2023   Stays with her mother, sister, friend here and there, lost her home, signing up for social security, has been sleeping in her car but she doesn't have it anymore. She does not want to go to a shelter. She was working in  [employer] work until she had her stroke in November 2024. She gets food stamps and medicaid.   Social Drivers of Corporate investment banker Strain: High Risk (12/23/2023)   Overall Financial Resource Strain (CARDIA)    Difficulty of Paying Living Expenses: Very hard  Food Insecurity: Food Insecurity Present (12/30/2023)   Hunger Vital Sign    Worried About Running Out of Food in the Last Year: Sometimes true    Ran Out of Food in the Last Year: Never true  Transportation Needs: Unmet Transportation Needs (12/30/2023)   PRAPARE - Transportation    Lack of Transportation (Medical): No    Lack of Transportation (Non-Medical): Yes  Physical Activity: Not on file  Stress: Stress Concern Present (07/06/2023)  [employer] of Occupational Health - Occupational Stress Questionnaire    Feeling of Stress : Rather much  Social Connections: Moderately Integrated (07/06/2023)   Social Connection and Isolation Panel    Frequency of Communication with Friends and Family: Three times a week    Frequency of Social Gatherings with Friends and Family: Once a week    Attends Religious Services: More than 4 times per year    Active Member of [employer] or Organizations: No    Attends Banker Meetings: Never    Marital Status: Living with partner    Allergies  Allergen Reactions   Ace Inhibitors Cough   Jardiance  [Empagliflozin ] Rash    Yeast infection/rash/UTI    Outpatient Medications Prior to Visit  Medication Sig Dispense Refill   acetaminophen  (TYLENOL ) 500 MG tablet Take 1,000 mg by mouth as needed for mild pain (pain score 1-3) or moderate pain (pain score 4-6).     albuterol  (VENTOLIN  HFA) 108 (90 Base) MCG/ACT inhaler Inhale 2 puffs into the lungs every 4 (four) hours as needed for wheezing or shortness of breath. 18 g 0   aspirin  81 MG chewable tablet Chew 1 tablet (81 mg total) by mouth daily. 30 tablet 1   atorvastatin  (LIPITOR) 40 MG tablet Take 1 tablet (40 mg total) by  mouth daily. 90 tablet 1   FLUoxetine  (PROZAC ) 20 MG capsule Take 1 capsule (20 mg total) by mouth daily. 90 capsule 1   fluticasone  (FLONASE ) 50 MCG/ACT nasal spray Place 2 sprays into both nostrils daily. 9.9 mL 2   hydrOXYzine  (ATARAX ) 25 MG tablet Take 1 tablet (25 mg total) by mouth at bedtime as needed. 30 tablet 1   ketoconazole  (NIZORAL ) 2 % shampoo Apply 1 Application topically 2 (two) times a week. 120 mL 1   Melatonin 10 MG TABS Take 10 mg by mouth at bedtime. 90 tablet 1   methimazole  (TAPAZOLE ) 5 MG tablet Take 1 tablet (5 mg total) by mouth 2 (two) times daily. 180 tablet 1   metoprolol  succinate (TOPROL -XL) 50 MG 24 hr tablet Take 1.5 tablets (75 mg total) by mouth daily. Take with or immediately following a meal. 135 tablet 1   Misc. Devices MISC Blood Pressure Monitor 1 each 0   Polyethylene Glycol 4500 POWD 17 g by Does not apply route daily at 6 (six) AM. Dissolve 1 capful of powder in one 8 ounce glass of water. Repeat daily or as needed to soften stool 500 g 0   polyethylene glycol powder (GLYCOLAX /MIRALAX ) 17 GM/SCOOP powder Take 17 g by mouth daily. 3350 g 1   polyethylene glycol powder (GLYCOLAX /MIRALAX ) 17 GM/SCOOP powder Dissolve 17 grams into water and drink by mouth every day. 3570 g 1   potassium chloride  SA (KLOR-CON  M20) 20 MEQ tablet Take 2 tablets (40 mEq total) by mouth daily. 180 tablet 1   sacubitril -valsartan  (ENTRESTO ) 49-51 MG Take 1 tablet by mouth 2 (two) times daily. 90 tablet 3   spironolactone  (ALDACTONE ) 25 MG tablet Take 1 tablet (25 mg total) by mouth daily. 90 tablet 1   torsemide  (DEMADEX ) 20 MG tablet Take 1 tablet (20 mg total) by mouth daily. 90 tablet 1   triamcinolone  cream (KENALOG ) 0.1 % Apply 1 Application topically 2 (two) times daily. 30 g 0   amoxicillin -clavulanate (AUGMENTIN ) 875-125 MG tablet Take 1 tablet by mouth every 12 (twelve) hours. (Patient not taking: Reported on 01/04/2024) 14 tablet 0   benzonatate  (TESSALON ) 100 MG capsule  Take 1  capsule (100 mg total) by mouth every 8 (eight) hours. (Patient not taking: Reported on 01/04/2024) 21 capsule 0   No facility-administered medications prior to visit.     ROS Review of Systems  Constitutional:  Negative for activity change and appetite change.  HENT:  Negative for sinus pressure and sore throat.   Respiratory:  Negative for chest tightness, shortness of breath and wheezing.   Cardiovascular:  Negative for chest pain and palpitations.  Gastrointestinal:  Negative for abdominal distention, abdominal pain and constipation.  Genitourinary: Negative.   Musculoskeletal: Negative.   Psychiatric/Behavioral:  Negative for behavioral problems and dysphoric mood.     Objective:  BP (!) 147/88   Pulse 71   Ht 5' 8 (1.727 m)   Wt 209 lb (94.8 kg)   SpO2 99%   BMI 31.78 kg/m      01/04/2024    3:12 PM 12/30/2023   10:59 AM 12/16/2023   12:00 PM  BP/Weight  Systolic BP 147  897  Diastolic BP 88  68  Wt. (Lbs) 209 200 203.7  BMI 31.78 kg/m2 30.41 kg/m2 30.97 kg/m2      Physical Exam Constitutional:      Appearance: She is well-developed.  Cardiovascular:     Rate and Rhythm: Normal rate.     Heart sounds: Normal heart sounds. No murmur heard. Pulmonary:     Effort: Pulmonary effort is normal.     Breath sounds: Normal breath sounds. No wheezing or rales.  Chest:     Chest wall: No tenderness.  Abdominal:     General: Bowel sounds are normal. There is no distension.     Palpations: Abdomen is soft. There is no mass.     Tenderness: There is no abdominal tenderness.  Musculoskeletal:        General: Normal range of motion.     Right lower leg: No edema.     Left lower leg: No edema.  Neurological:     Mental Status: She is alert and oriented to person, place, and time.  Psychiatric:        Mood and Affect: Mood normal.        Latest Ref Rng & Units 07/06/2023   11:49 AM 04/20/2023    8:34 AM 04/11/2023    1:59 PM  CMP  Glucose 70 - 99 mg/dL  90  99  893   BUN 6 - 24 mg/dL 13  6  12    Creatinine 0.57 - 1.00 mg/dL 9.26  9.19  8.89   Sodium 134 - 144 mmol/L 139  142  139   Potassium 3.5 - 5.2 mmol/L 4.2  4.1  3.0   Chloride 96 - 106 mmol/L 102  106  98   CO2 20 - 29 mmol/L 22     Calcium  8.7 - 10.2 mg/dL 9.0     Total Protein 6.0 - 8.5 g/dL 6.1     Total Bilirubin 0.0 - 1.2 mg/dL 0.6     Alkaline Phos 44 - 121 IU/L 105     AST 0 - 40 IU/L 13     ALT 0 - 32 IU/L 17       Lipid Panel     Component Value Date/Time   CHOL 121 07/06/2023 1149   TRIG 68 07/06/2023 1149   HDL 52 07/06/2023 1149   CHOLHDL 2.3 07/06/2023 1149   CHOLHDL 3.5 04/12/2023 0727   VLDL 12 04/12/2023 0727   LDLCALC 55 07/06/2023 1149  CBC    Component Value Date/Time   WBC 9.2 07/06/2023 1149   WBC 5.3 04/11/2023 1351   RBC 4.79 07/06/2023 1149   RBC 5.13 (H) 04/11/2023 1351   HGB 13.9 07/06/2023 1149   HCT 42.2 07/06/2023 1149   PLT 215 07/06/2023 1149   MCV 88 07/06/2023 1149   MCH 29.0 07/06/2023 1149   MCH 28.7 04/11/2023 1351   MCHC 32.9 07/06/2023 1149   MCHC 33.0 04/11/2023 1351   RDW 13.4 07/06/2023 1149   LYMPHSABS 2.1 07/06/2023 1149   MONOABS 0.4 04/11/2023 1351   EOSABS 0.0 07/06/2023 1149   BASOSABS 0.0 07/06/2023 1149    Lab Results  Component Value Date   HGBA1C 7.4 (A) 01/04/2024    Lab Results  Component Value Date   TSH 1.320 06/21/2023       Assessment & Plan Type 2 diabetes mellitus  Suboptimally controlled with A1c of 7.4 up from 6.7 Type 2 diabetes with recent weight gain and increased A1c. Previous allergic reaction to Jardiance . Discussed Ozempic  for diabetes and weight management, including benefits for heart health and potential side effects. - Initiate Ozempic  0.25 mg weekly for 4 weeks, then increase to 0.5 mg weekly for 4 weeks, then increase to 1 mg weekly. - Order A1c test to monitor diabetes control. - Reassess A1c and weight loss progress in 3 months.  Hypertensive heart disease with  Chronic combined systolic and diastolic heart failure with planned ICD placement EF 30-35% Chronic heart failure with reduced ejection fraction. ICD placement recommended to prevent arrhythmias. Reassured her about the necessity of the procedure. - Proceed with ICD placement as recommended by cardiologist.  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 Undergoing speech therapy and rehabilitation. - Continue speech therapy and rehabilitation.  Depression Depression managed with fluoxetine . Reports ongoing mental health challenges and plans to see a therapist. - Continue fluoxetine  for depression. - Encourage follow-up with therapist for mental health support.  Hyperthyroidism (on methimazole ) Hyperthyroidism managed with methimazole . Discussed importance of medication adherence and potential for weight loss medications aiding diabetes management. - Continue methimazole  5 mg twice daily. - Monitor thyroid  function tests.  Hypokalemia (on potassium supplementation) Hypokalemia managed with potassium supplementation. On multiple medications affecting potassium levels. - Order blood test to monitor potassium levels.  Rash (on triamcinolone ) Rash managed with triamcinolone  cream. Requests refill for treatment. - Prescribe triamcinolone  cream for rash.   Need for dental care Dental referral placed   Meds ordered this encounter  Medications   DISCONTD: triamcinolone  cream (KENALOG ) 0.1 %    Sig: Apply 1 Application topically 2 (two) times daily.    Dispense:  30 g    Refill:  0   DISCONTD: Semaglutide ,0.25 or 0.5MG /DOS, (OZEMPIC , 0.25 OR 0.5 MG/DOSE,) 2 MG/1.5ML SOPN    Sig: Inject 0.25 mg into the skin once a week. For 4 weeks then increase to 0.5mg     Dispense:  2 mL    Refill:  0   DISCONTD: Semaglutide ,0.25 or 0.5MG /DOS, (OZEMPIC , 0.25 OR 0.5 MG/DOSE,) 2 MG/3ML SOPN    Sig: Inject  0.5 mg into the skin once a week. For 4 weeks then increase to 1mg     Dispense:  3 mL    Refill:  0   DISCONTD: Semaglutide , 1 MG/DOSE, 4 MG/3ML SOPN    Sig: Inject 1 mg as directed once a week.    Dispense:  3 mL    Refill:  3  Semaglutide , 1 MG/DOSE, 4 MG/3ML SOPN    Sig: Inject 1 mg as directed once a week.    Dispense:  3 mL    Refill:  3   Semaglutide ,0.25 or 0.5MG /DOS, (OZEMPIC , 0.25 OR 0.5 MG/DOSE,) 2 MG/3ML SOPN    Sig: Inject 0.25 mg into the skin once a week. For 4 weeks then increase to 0.5mg     Dispense:  3 mL    Refill:  0   Semaglutide ,0.25 or 0.5MG /DOS, (OZEMPIC , 0.25 OR 0.5 MG/DOSE,) 2 MG/3ML SOPN    Sig: Inject 0.5 mg into the skin once a week. For 4 weeks then increase to 1mg     Dispense:  3 mL    Refill:  0   triamcinolone  cream (KENALOG ) 0.1 %    Sig: Apply 1 Application topically 2 (two) times daily.    Dispense:  30 g    Refill:  0    Follow-up: Return in about 3 months (around 04/05/2024) for Chronic medical conditions.       Corrina Sabin, MD, FAAFP. [facility] [place], [place] [phone]   01/04/2024, 5:56 PM

## 2024-01-04 NOTE — Patient Instructions (Signed)

## 2024-01-05 ENCOUNTER — Other Ambulatory Visit: Payer: Self-pay

## 2024-01-05 ENCOUNTER — Telehealth: Payer: Self-pay

## 2024-01-05 LAB — CMP14+EGFR
ALT: 27 IU/L (ref 0–32)
AST: 19 IU/L (ref 0–40)
Albumin: 3.8 g/dL (ref 3.8–4.9)
Alkaline Phosphatase: 117 IU/L (ref 44–121)
BUN/Creatinine Ratio: 16 (ref 9–23)
BUN: 10 mg/dL (ref 6–24)
Bilirubin Total: 0.6 mg/dL (ref 0.0–1.2)
CO2: 24 mmol/L (ref 20–29)
Calcium: 9 mg/dL (ref 8.7–10.2)
Chloride: 106 mmol/L (ref 96–106)
Creatinine, Ser: 0.64 mg/dL (ref 0.57–1.00)
Globulin, Total: 2.6 g/dL (ref 1.5–4.5)
Glucose: 139 mg/dL — ABNORMAL HIGH (ref 70–99)
Potassium: 4.3 mmol/L (ref 3.5–5.2)
Sodium: 140 mmol/L (ref 134–144)
Total Protein: 6.4 g/dL (ref 6.0–8.5)
eGFR: 102 mL/min/1.73 (ref >59)

## 2024-01-05 LAB — MICROALBUMIN / CREATININE URINE RATIO
Creatinine, Urine: 106.3 mg/dL
Microalb/Creat Ratio: 7 mg/g{creat} (ref 0–29)
Microalbumin, Urine: 7.2 ug/mL

## 2024-01-05 LAB — TSH: TSH: 0.937 u[IU]/mL (ref 0.450–4.500)

## 2024-01-05 LAB — T4, FREE: Free T4: 1.15 ng/dL (ref 0.82–1.77)

## 2024-01-05 LAB — T3: T3, Total: 148 ng/dL (ref 71–180)

## 2024-01-05 NOTE — Telephone Encounter (Signed)
 Pharmacy Patient Advocate Encounter   Received notification from CoverMyMeds that prior authorization for OZEMPIC  is required/requested.   Insurance verification completed.   The patient is insured through [facility] MEDICAID .   Per test claim: PA required; PA submitted to above mentioned insurance via CoverMyMeds Key/confirmation #/EOC [id number] Status is pending

## 2024-01-05 NOTE — Telephone Encounter (Unsigned)
 Copied from CRM [id number]. Topic: Clinical - Medication Prior Auth >> Jan 05, 2024 12:35 PM Dedra B wrote: Reason for CRM: Pt calling to follow up on prior authorization for Semaglutide  injections. Would like a call back.

## 2024-01-06 ENCOUNTER — Ambulatory Visit: Payer: Self-pay | Admitting: Family Medicine

## 2024-01-06 ENCOUNTER — Other Ambulatory Visit: Payer: Self-pay

## 2024-01-06 NOTE — Telephone Encounter (Signed)
 LVM informing patient that PA is pending.

## 2024-01-06 NOTE — Progress Notes (Signed)
 SDOH completed 7 days ago

## 2024-01-06 NOTE — Telephone Encounter (Unsigned)
 Copied from CRM #[id number]. Topic: Clinical - Medication Prior Auth >> Jan 06, 2024  8:14 AM Willma SAUNDERS wrote: Reason for CRM: Patient calling for an update on the prior auth request for Ozempic . Is requesting a callback.  Patient can be reached at [phone]

## 2024-01-09 ENCOUNTER — Other Ambulatory Visit: Payer: Self-pay

## 2024-01-09 NOTE — Telephone Encounter (Signed)
 Pharmacy Patient Advocate Encounter  Received notification from [facility] MEDICAID that Prior Authorization for OZEMPIC  has been APPROVED from 01/05/2024 to 01/04/2025   PA #/Case ID/Reference #: [id number]

## 2024-01-10 ENCOUNTER — Ambulatory Visit: Admitting: Occupational Therapy

## 2024-01-10 ENCOUNTER — Other Ambulatory Visit: Payer: Self-pay

## 2024-01-10 DIAGNOSIS — R278 Other lack of coordination: Secondary | ICD-10-CM

## 2024-01-10 DIAGNOSIS — R41842 Visuospatial deficit: Secondary | ICD-10-CM

## 2024-01-10 DIAGNOSIS — R29818 Other symptoms and signs involving the nervous system: Secondary | ICD-10-CM

## 2024-01-10 DIAGNOSIS — R41844 Frontal lobe and executive function deficit: Secondary | ICD-10-CM

## 2024-01-10 NOTE — Patient Instructions (Signed)
 DATE Blood Pressure  Blood Pressure  Weight   1     2     3     4     5     6     7     8     9     10     11     12     13     14     15     16     17     18     19     20     21     22     23     24     25     26     27     28     29     30      31

## 2024-01-10 NOTE — Therapy (Signed)
 OUTPATIENT OCCUPATIONAL THERAPY NEURO TREATMENT  Patient Name: Chelsea Branch MRN: [id number] DOB:[birth year], 58 y.o., female Today's Date: 01/10/2024  PCP: Delbert Clam, MD  REFERRING PROVIDER: Delbert Clam, MD  END OF SESSION:  OT End of Session - 01/10/24 1228     Visit Number 5    Number of Visits 7    Date for OT Re-Evaluation 01/27/24    Authorization Type [employer] - no auth required per appt notes    OT Start Time 1230    OT Stop Time 1315    OT Time Calculation (min) 45 min    Activity Tolerance Patient tolerated treatment well    Behavior During Therapy [facility] for tasks assessed/performed         Past Medical History:  Diagnosis Date   Abnormal thyroid  blood test 03/15/2017   Abnormal uterine bleeding    Alopecia    Anemia    Angina pectoris with normal coronary arteriogram (HCC) 2017   Had + Troponin c/w ? 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 iniatially ?2/2 peripartum in 1994 - improved by 2008 then worsening EF in 2011 back down to EF 25-30%. b. echo 01/21/14 showed mod LVH, [name]; 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   Peripartum cardiomyopathy 1994   Sleep apnea 2015   CPAP 12/2013   Stroke (HCC) 2011   Systolic and diastolic CHF, acute on chronic (HCC) 06/14/2015   Tobacco abuse    Type 2 diabetes mellitus (HCC) 04/28/2023   Past Surgical History:  Procedure Laterality Date   CARDIAC CATHETERIZATION N/A 06/16/2015   Procedure: Left Heart Cath and Coronary  Angiography;  Surgeon: Candyce GORMAN Reek, MD;  Location: [facility] INVASIVE CV LAB;  Service: Cardiovascular;  Laterality: N/A;   CESAREAN SECTION  1992  1994   INTRAUTERINE DEVICE (IUD) INSERTION     about 3 years ago   LOOP RECORDER IMPLANT  ~ 2000   spine injections      [facility],  Dr Darlis   TIBIAL TUBERCLERPLASTY     TRANSESOPHAGEAL ECHOCARDIOGRAM (CATH LAB) N/A 04/20/2023   Procedure: TRANSESOPHAGEAL ECHOCARDIOGRAM;  Surgeon: Alvan Ronal BRAVO, MD;  Location: [facility] INVASIVE CV LAB;  Service: Cardiovascular;  Laterality: N/A;   TUBAL LIGATION  05/24/1992   Patient Active Problem List   Diagnosis Date Noted   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   History of stroke with residual effects 07/06/2023   Frequent falls 07/06/2023   Type 2 diabetes mellitus (HCC) 04/28/2023   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) 04/28/2023   History of cerebellar stroke 04/13/2023   History of stroke 04/11/2023   Liletta  IUD (intrauterine device) in place since 12/15/2015 04/06/2023   Chronic pain syndrome 12/28/2021   Primary osteoarthritis of both knees 12/28/2021   Spinal stenosis of lumbar region 07/21/2021   Lumbar radiculitis 07/21/2021   COPD (chronic obstructive pulmonary disease) (HCC) 07/21/2021   Mechanical complication due to intrauterine contraceptive device 07/21/2021   Arthropathy  of lumbar facet joint 03/14/2019   Degenerative disc disease at L5-S1 level 10/10/2018   Hyperthyroidism 04/26/2016   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   History of non-ST elevation myocardial infarction (NSTEMI) 06/14/2015   Homelessness 09/16/2014   Vitamin D  deficiency 01/29/2014   OSA (obstructive sleep apnea) 01/08/2014   Chronic combined systolic and diastolic CHF (congestive heart failure) (HCC) 08/14/2013   Essential hypertension    Fibroids 08/09/2012   Menorrhagia 05/08/2012   Female pattern hair loss    HLD (hyperlipidemia) 06/08/2010    CEREBRAL ANEURYSM 06/08/2010   History of cardiovascular disorder 06/08/2010    ONSET DATE: 10/24/2023 (referral)   REFERRING DIAG: I69.30 (ICD-10-CM) - History of stroke with residual effects   THERAPY DIAG:  Visuospatial deficit  Other symptoms and signs involving the nervous system  Frontal lobe and executive function deficit  Other lack of coordination  Rationale for Evaluation and Treatment: Rehabilitation  SUBJECTIVE:   SUBJECTIVE STATEMENT:  Pt reports she missed an appointment with social services last week ie) [facility] lady called but they had a death in the family and she didn't answer her phone.  She had inquiry re: help with incontinence products and continues to desire information re: housing.   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   PRECAUTIONS: Fall  WEIGHT BEARING RESTRICTIONS: No  PAIN:  Are you having pain? NA  FALLS: Has patient fallen in last 6 months? No  LIVING ENVIRONMENT: Lives with: lives with their partner Lives in: House/apartment Stairs: No Has following equipment at home: Vannie - 4 wheeled  PLOF: Requires assistive device for independence; worked in a Chemical engineer; does not have a car to drive; likes baking and cooking; reading; Archivist; crocheting  PATIENT GOALS: To improve level of activity during the day; join gym; get into a home of her own  OBJECTIVE:  Note: Objective measures were completed at Evaluation unless otherwise noted.  HAND DOMINANCE: Right  ADLs: Overall ADLs: mod I  Equipment: had shower chair but lost her storage unit and is homeless  IADLs:  Light housekeeping: sweeping and mopping are a little more  difficult and she has to sit or take breaks between  Handwriting: pt reports her handwriting is about 10% of prior level  MOBILITY STATUS: Needs Assist: Ambulates with use of rollator  ACTIVITY TOLERANCE: Activity tolerance: fair to poor  FUNCTIONAL OUTCOME MEASURES: Quick Dash: 40.9 % disability   UPPER EXTREMITY ROM:    BUE: WNL  UPPER EXTREMITY MMT:     BUE: WFL though poor endurance  HAND FUNCTION: Grip strength: Right: 37.2 lbs; Left: 36.5 lbs  COORDINATION: 9 Hole Peg test: Right: 24 sec; Left: 25 sec  SENSATION: Paresthesias in RUE from previous work-related accident  EDEMA: mild reported and observed  MUSCLE TONE: WFL  COGNITION: Overall cognitive status: Impaired  VISION: Subjective report: Pt has difficulty seeing items on left side and has spots in vision that are not clear. She turns head to left side to be able to see things. Baseline vision: does not have corrected lenses Visual history: Thyroid  eye disease  VISION ASSESSMENT: Reading and distance acuity: WFL  45* left peripheral vision; R peripheral vision WFL  PERCEPTION: Impaired: Spatial orientation: unable to orient  to dry erase marker; reports difficulty with writing to make letters correctly  PRAXIS: Impaired: Motor planning  OBSERVATIONS: Pt ambulates with use of rollator. No loss of balance. The pt appears fairly well kept and has mask donned.                                                                                                                           TODAY'S TREATMENT :    - Self Care education completed for duration as noted below including:  Pt assisted with organizing her information from her folder ie) housing and food bank info; visual perceptual activities and added a list for pt to record her blood pressure and weight for MD follow up (see pt instructions).  Pt encouraged to get a binder to put the sheet protectors as OT had to combine pages with book rings.   -  Therapeutic activities completed for duration as noted below including: OT educated pt on table top play of Golf Solitaire with BUEs to address fine motor coordination, scanning and locating of items, processing, sequencing of unfamiliar motor movements or tasks, and item/pattern recognition. Pt required moderate cues for proper play initially and minimally as game proceeded.  PATIENT EDUCATION: Education details: BP record; Manufacturing systems engineer Person educated: Patient Education method: Explanation, Demonstration, Verbal cues, and Handouts Education comprehension: verbalized understanding, returned demonstration, verbal cues required, and needs further education  HOME EXERCISE PROGRAM: 12/19/23: Visual Scanning and Visual Compensation 12/27/2023: golf solitaire; EC strategies; putty HEP 01/02/2024: sleep hygiene  GOALS:  SHORT TERM GOALS: Target date: 12/27/2023    Patient will demonstrate initial UE HEP with 25% verbal cues or less for proper execution. Baseline: Goal status: MET  2.  Patient will independently verbalize at least 3 energy conservation principles in relation to ADLs to increase functional independence. Baseline:  Goal status: IN PROGRESS  3.  Patient will independently recall at least 2 compensatory strategies for visual impairment without cueing. Baseline:  Goal status: IN Progress  4.  Pt will participate in creating a daily schedule as needed to form new routines and improve overall mental health. Baseline:  Goal status: IN PROGRESS  LONG TERM GOALS: Target date: 01/27/2024    Patient will demonstrate UE HEP with visual handouts only for proper execution. Baseline:  Goal status: IN Progress  2.  Patient will demonstrate at least 16% improvement with quick Dash score (reporting 24.9% disability or less) indicating improved functional use of affected extremity. Baseline: 40.9 % disability Goal status: IN Progress  3.  Patient will independently recall at least 3  strategies for better sleep/sleep hygiene. Baseline:  Goal status: IN PROGRESS  ASSESSMENT:  CLINICAL IMPRESSION: Patient continues to be distracted by social limitations ie) housing and food insecurities but she has resources and Complex Care Management . She will require additional review to promote carryover and progression towards goals. Pt will benefit from continued skilled OT services in the outpatient setting to work on impairments as noted  at evaluation to help pt return to [facility] as able.   PERFORMANCE DEFICITS: in functional skills including ADLs, IADLs, coordination, sensation, edema, strength, Fine motor control, mobility, endurance, decreased knowledge of use of DME, vision, and UE functional use, cognitive skills including orientation and perception, and psychosocial skills including coping strategies, environmental adaptation, and routines and behaviors.   IMPAIRMENTS: are limiting patient from ADLs, IADLs, rest and sleep, work, and leisure.   CO-MORBIDITIES: has co-morbidities such as NICM and COPD that affects occupational performance. Patient will benefit from skilled OT to address above impairments and improve overall function.  REHAB POTENTIAL: Good for goals stated  PLAN:  OT FREQUENCY: 1x/week  OT DURATION: 6 weeks  PLANNED INTERVENTIONS: 97168 OT Re-evaluation, 97535 self care/ADL training, 02889 therapeutic exercise, 97530 therapeutic activity, 97112 neuromuscular re-education, 97140 manual therapy, 97035 ultrasound, 97039 fluidotherapy, functional mobility training, visual/perceptual remediation/compensation, energy conservation, coping strategies training, patient/family education, and DME and/or AE instructions  RECOMMENDED OTHER SERVICES: [facility] for health management/SW  CONSULTED AND AGREED WITH PLAN OF CARE: Patient  PLAN FOR NEXT SESSION:  Review and update goals for vision strategies, sleep hygiene, and EC strategies; basic strengthening  (has putty); daily routines in place of work; social services update? Needs coordination handouts and further update to memory binder/page collection  Clarita LITTIE Pride, OT 01/10/2024, 5:37 PM

## 2024-01-11 ENCOUNTER — Other Ambulatory Visit: Payer: Self-pay

## 2024-01-16 ENCOUNTER — Other Ambulatory Visit: Payer: Self-pay

## 2024-01-16 ENCOUNTER — Ambulatory Visit

## 2024-01-17 ENCOUNTER — Other Ambulatory Visit: Payer: Self-pay

## 2024-01-17 ENCOUNTER — Ambulatory Visit: Admitting: Occupational Therapy

## 2024-01-17 DIAGNOSIS — M6281 Muscle weakness (generalized): Secondary | ICD-10-CM

## 2024-01-17 DIAGNOSIS — R278 Other lack of coordination: Secondary | ICD-10-CM

## 2024-01-17 DIAGNOSIS — R41844 Frontal lobe and executive function deficit: Secondary | ICD-10-CM

## 2024-01-17 DIAGNOSIS — R41840 Attention and concentration deficit: Secondary | ICD-10-CM

## 2024-01-17 DIAGNOSIS — R41842 Visuospatial deficit: Secondary | ICD-10-CM | POA: Diagnosis not present

## 2024-01-17 NOTE — Therapy (Addendum)
 OUTPATIENT OCCUPATIONAL THERAPY NEURO TREATMENT  Patient Name: Chelsea Branch MRN: [id number] DOB:09-Aug-1965, 58 y.o., female Today's Date: 01/17/2024  PCP: Delbert Clam, MD  REFERRING PROVIDER: Delbert Clam, MD  END OF SESSION:  OT End of Session - 01/17/24 1231     Visit Number 6    Number of Visits 7    Date for OT Re-Evaluation 01/27/24    Authorization Type [employer] - no auth required per appt notes    OT Start Time 1230    OT Stop Time 1315    OT Time Calculation (min) 45 min    Activity Tolerance Patient tolerated treatment well    Behavior During Therapy [facility] for tasks assessed/performed         Past Medical History:  Diagnosis Date   Abnormal thyroid  blood test 03/15/2017   Abnormal uterine bleeding    Alopecia    Anemia    Angina pectoris with normal coronary arteriogram (HCC) 2017   Had + Troponin c/w ? 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 iniatially ?2/2 peripartum in 1994 - improved by 2008 then worsening EF in 2011 back down to EF 25-30%. b. echo 01/21/14 showed mod LVH, [name]; 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   Peripartum cardiomyopathy 1994   Sleep apnea 2015   CPAP 12/2013   Stroke (HCC) 2011   Systolic and diastolic CHF, acute on chronic (HCC) 06/14/2015   Tobacco abuse    Type 2 diabetes mellitus (HCC) 04/28/2023   Past Surgical History:  Procedure Laterality Date   CARDIAC CATHETERIZATION N/A 06/16/2015   Procedure: Left Heart Cath and Coronary  Angiography;  Surgeon: Candyce GORMAN Reek, MD;  Location: [facility] INVASIVE CV LAB;  Service: Cardiovascular;  Laterality: N/A;   CESAREAN SECTION  1992  1994   INTRAUTERINE DEVICE (IUD) INSERTION     about 3 years ago   LOOP RECORDER IMPLANT  ~ 2000   spine injections      [facility],  Dr Darlis   TIBIAL TUBERCLERPLASTY     TRANSESOPHAGEAL ECHOCARDIOGRAM (CATH LAB) N/A 04/20/2023   Procedure: TRANSESOPHAGEAL ECHOCARDIOGRAM;  Surgeon: Alvan Ronal BRAVO, MD;  Location: [facility] INVASIVE CV LAB;  Service: Cardiovascular;  Laterality: N/A;   TUBAL LIGATION  05/24/1992   Patient Active Problem List   Diagnosis Date Noted   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   History of stroke with residual effects 07/06/2023   Frequent falls 07/06/2023   Type 2 diabetes mellitus (HCC) 04/28/2023   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) 04/28/2023   History of cerebellar stroke 04/13/2023   History of stroke 04/11/2023   Liletta  IUD (intrauterine device) in place since 12/15/2015 04/06/2023   Chronic pain syndrome 12/28/2021   Primary osteoarthritis of both knees 12/28/2021   Spinal stenosis of lumbar region 07/21/2021   Lumbar radiculitis 07/21/2021   COPD (chronic obstructive pulmonary disease) (HCC) 07/21/2021   Mechanical complication due to intrauterine contraceptive device 07/21/2021   Arthropathy  of lumbar facet joint 03/14/2019   Degenerative disc disease at L5-S1 level 10/10/2018   Hyperthyroidism 04/26/2016   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   History of non-ST elevation myocardial infarction (NSTEMI) 06/14/2015   Homelessness 09/16/2014   Vitamin D  deficiency 01/29/2014   OSA (obstructive sleep apnea) 01/08/2014   Chronic combined systolic and diastolic CHF (congestive heart failure) (HCC) 08/14/2013   Essential hypertension    Fibroids 08/09/2012   Menorrhagia 05/08/2012   Female pattern hair loss    HLD (hyperlipidemia) 06/08/2010    CEREBRAL ANEURYSM 06/08/2010   History of cardiovascular disorder 06/08/2010    ONSET DATE: 10/24/2023 (referral)   REFERRING DIAG: I69.30 (ICD-10-CM) - History of stroke with residual effects   THERAPY DIAG:  Other lack of coordination  Muscle weakness (generalized)  Attention and concentration deficit  Frontal lobe and executive function deficit  Rationale for Evaluation and Treatment: Rehabilitation  SUBJECTIVE:   SUBJECTIVE STATEMENT:  Pt reports has not heard back from [facility] but upon further inquiry, it was determined that pt was referring to [facility] and contact info provided to her as follows: Claudetta Sale, Interim Librarian, academic, [email or web address] or call [phone] Ext.[phone]. Physical Address [address] [place], [place] [address] Hours Monday - Friday: Services: 8:00AM - 3:00PM Offices: 8:00AM - 5:00PM  Pt continues to bring her page protectors with HEP ideas and keep this in her rollator.  Pt had her list for BPs/weights and had recorded at least 5 days and even 2x/day on 2 occasions as needed for MD follow up.  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   PRECAUTIONS: Fall  WEIGHT BEARING RESTRICTIONS: No  PAIN:  Are you having pain? NA  FALLS: Has patient fallen in last 6 months? No  LIVING ENVIRONMENT: Lives with: lives with their partner Lives in: House/apartment Stairs: No Has following equipment at home: Vannie - 4 wheeled  PLOF: Requires assistive device for independence; worked in a [employer]; does not have a car to drive; likes baking and cooking; reading; Archivist; crocheting  PATIENT GOALS: To  improve level of activity during the day; join gym; get into a home of her own  OBJECTIVE:  Note: Objective measures were completed at Evaluation unless otherwise noted.  HAND DOMINANCE: Right  ADLs: Overall ADLs: mod I  Equipment: had shower chair but lost her storage unit and is homeless  IADLs:  Light housekeeping: sweeping and mopping are a little more difficult and she has to sit or take breaks between  Handwriting: pt reports her handwriting is about 10% of prior level  MOBILITY STATUS: Needs Assist: Ambulates with use of rollator  ACTIVITY TOLERANCE: Activity tolerance: fair to poor  FUNCTIONAL OUTCOME MEASURES: Quick Dash: 40.9 % disability   UPPER EXTREMITY ROM:    BUE: WNL  UPPER EXTREMITY MMT:     BUE: WFL though poor endurance  HAND FUNCTION: Grip strength: Right: 37.2 lbs; Left: 36.5 lbs  COORDINATION: 9 Hole Peg test: Right: 24 sec; Left: 25 sec  SENSATION: Paresthesias in RUE from previous work-related accident  EDEMA: mild reported and observed  MUSCLE TONE: WFL  COGNITION: Overall cognitive status: Impaired  VISION: Subjective report: Pt has difficulty seeing items on left side and  has spots in vision that are not clear. She turns head to left side to be able to see things. Baseline vision: does not have corrected lenses Visual history: Thyroid  eye disease  VISION ASSESSMENT: Reading and distance acuity: WFL  45* left peripheral vision; R peripheral vision WFL  PERCEPTION: Impaired: Spatial orientation: unable to orient to dry erase marker; reports difficulty with writing to make letters correctly  PRAXIS: Impaired: Motor planning  OBSERVATIONS: Pt ambulates with use of rollator. No loss of balance. The pt appears fairly well kept and has mask donned.                                                                                                                           TODAY'S TREATMENT :    - Self Care education completed for  duration as noted below including:  Pt assisted with ensuring her information remained organized in sheet protectors as OT previously combined with book rings along with addition of new handouts and info today.  Pt provided contact info for [facility] program also to help with housing concerns etc.   Reviewed 5 main sensory precautions (cold, heat, sharp/breakable, chemical, and heavy) as needed to prevent injury/harm secondary to impairments due to observation of a burn on pt's arm.  Reviewed options re: wooden oven rack pull and/or consideration of air fryer or toaster oven for increased safety with cooking.   - Therapeutic activities completed for duration as noted below including:  Coordination Exercise/Activity handout with images provided for various activities to work on B UE finger ROM, dexterity and isolated movements with demonstration and practice, as well as modification, hand over hand guidance and cues throughout to improve technique, digital isolation and ease of performing task.  Tasks included: Pick up coins, dominoes, buttons, marbles, dried beans/pasta of different sizes ... To place in containers To stack - with guidance to work on include/isolate specific fingers. To pick up items one at a time until patient got 5+ in their hand and then move item from palm to fingertips to release ie) Finger-to-palm then palm-to-finger translation of small items - Options to vary difficulty include using a washcloth under items like coins or using larger items (checkers vs coins or blocks/dominos vs dice) for increased ease of picking up items. Shuffling, Flipping and dealing cards 1 at a time. -- Setup patient to work on sorting cards, focusing on using index finger with thumb to flip cards or holding deck of cards in palm of hand and push off 1 card at a time from the top of the deck using only thumb   Rotate golf balls (clockwise and counter-clockwise) with forearm pronated and balls on table or  supinated and balls in hand. Twirl pen/cil between fingers. - Encouragement to isolate fingers individually and twirl (rotation) or flipping and shift up and down the pen (translation) to get it in position for writing or erasing.  Tear a piece of paper towel and roll it into  small balls with fingertips ie) straw wrapping when eating out.   Patient is encouraged to take breaks, relax arm/shoulder by supporting forearm, minimize compensatory motions and a try different activities throughout the day/week including games like Londa (for the dice), card games, Connect 4 etc.  Patient benefited from extra time, verbal/tactile cues, and modeling of task to allow time for processing of verbal instructions and improve motor planning of unfamiliar movements.  PATIENT EDUCATION: Education details: Theatre manager. Person educated: Patient Education method: Explanation, Demonstration, Verbal cues, and Handouts Education comprehension: verbalized understanding, returned demonstration, verbal cues required, and needs further education  HOME EXERCISE PROGRAM: 12/19/23: Visual Scanning and Visual Compensation 12/27/2023: golf solitaire; EC strategies; putty HEP 01/02/2024: sleep hygiene 01/17/24: coordination activities  GOALS:  SHORT TERM GOALS: Target date: 12/27/2023    Patient will demonstrate initial UE HEP with 25% verbal cues or less for proper execution. Baseline: Goal status: MET  2.  Patient will independently verbalize at least 3 energy conservation principles in relation to ADLs to increase functional independence. Baseline:  Goal status: IN PROGRESS  3.  Patient will independently recall at least 2 compensatory strategies for visual impairment without cueing. Baseline:  Goal status: IN Progress 01/17/24 - Needs eye exam - still feels things are blurry, has magnify glass,   4.  Pt will participate in creating a daily schedule as needed to form new routines  and improve overall mental health. Baseline:  Goal status: IN PROGRESS 8/26 - keeping track of BP/weights (need to ensure carryover of HEP ideas and walking)  LONG TERM GOALS: Target date: 01/27/2024    Patient will demonstrate UE HEP with visual handouts only for proper execution. Baseline:  Goal status: IN Progress  2.  Patient will demonstrate at least 16% improvement with quick Dash score (reporting 24.9% disability or less) indicating improved functional use of affected extremity. Baseline: 40.9 % disability Goal status: IN Progress  3.  Patient will independently recall at least 3 strategies for better sleep/sleep hygiene. Baseline:  Goal status: IN PROGRESS 01/17/24 - little better, but reports stressors ie) lots of people passed away, finding a place, etc  ASSESSMENT:  CLINICAL IMPRESSION: Patient continues to be distracted by social limitations ie) housing and food insecurities but she has resources and Complex Care Management . She responded well to HEP ideas for coordination today though once info was provided re: [facility]. Pt will benefit from continued skilled OT services in the outpatient setting to work on impairments as noted at evaluation to help pt return to [facility] as able.   PERFORMANCE DEFICITS: in functional skills including ADLs, IADLs, coordination, sensation, edema, strength, Fine motor control, mobility, endurance, decreased knowledge of use of DME, vision, and UE functional use, cognitive skills including orientation and perception, and psychosocial skills including coping strategies, environmental adaptation, and routines and behaviors.   IMPAIRMENTS: are limiting patient from ADLs, IADLs, rest and sleep, work, and leisure.   CO-MORBIDITIES: has co-morbidities such as NICM and COPD that affects occupational performance. Patient will benefit from skilled OT to address above impairments and improve overall function.  REHAB POTENTIAL: Good for goals stated  PLAN:  OT  FREQUENCY: 1x/week  OT DURATION: 6 weeks  PLANNED INTERVENTIONS: 97168 OT Re-evaluation, 97535 self care/ADL training, 02889 therapeutic exercise, 97530 therapeutic activity, 97112 neuromuscular re-education, 97140 manual therapy, 97035 ultrasound, 97039 fluidotherapy, functional mobility training, visual/perceptual remediation/compensation, energy conservation, coping strategies training, patient/family education, and DME and/or AE instructions  RECOMMENDED OTHER SERVICES: [facility]/SW  CONSULTED AND AGREED WITH PLAN OF CARE: Patient  PLAN FOR NEXT SESSION:  Review and update goals for vision strategies, sleep hygiene, and EC strategies;  HEP routine for putty, coordination, LB TE etc DC Visit? Vs [facility]  Clarita LITTIE Pride, OT 01/17/2024, 8:26 PM

## 2024-01-17 NOTE — Patient Instructions (Addendum)
 [name]

## 2024-01-19 ENCOUNTER — Other Ambulatory Visit: Payer: Self-pay

## 2024-01-20 DIAGNOSIS — I428 Other cardiomyopathies: Secondary | ICD-10-CM | POA: Diagnosis not present

## 2024-01-20 DIAGNOSIS — I639 Cerebral infarction, unspecified: Secondary | ICD-10-CM | POA: Diagnosis not present

## 2024-01-20 DIAGNOSIS — I5042 Chronic combined systolic (congestive) and diastolic (congestive) heart failure: Secondary | ICD-10-CM | POA: Diagnosis not present

## 2024-01-21 LAB — BASIC METABOLIC PANEL WITH GFR
BUN/Creatinine Ratio: 13 (ref 9–23)
BUN: 11 mg/dL (ref 6–24)
CO2: 23 mmol/L (ref 20–29)
Calcium: 9.1 mg/dL (ref 8.7–10.2)
Chloride: 106 mmol/L (ref 96–106)
Creatinine, Ser: 0.87 mg/dL (ref 0.57–1.00)
Glucose: 108 mg/dL — ABNORMAL HIGH (ref 70–99)
Potassium: 3.4 mmol/L — ABNORMAL LOW (ref 3.5–5.2)
Sodium: 142 mmol/L (ref 134–144)
eGFR: 77 mL/min/1.73 (ref >59)

## 2024-01-21 LAB — CBC
Hematocrit: 44.4 % (ref 34.0–46.6)
Hemoglobin: 14.4 g/dL (ref 11.1–15.9)
MCH: 29.3 pg (ref 26.6–33.0)
MCHC: 32.4 g/dL (ref 31.5–35.7)
MCV: 90 fL (ref 79–97)
Platelets: 235 x10E3/uL (ref 150–450)
RBC: 4.91 x10E6/uL (ref 3.77–5.28)
RDW: 12.8 % (ref 11.7–15.4)
WBC: 4.7 x10E3/uL (ref 3.4–10.8)

## 2024-01-24 ENCOUNTER — Ambulatory Visit: Attending: Critical Care Medicine | Admitting: Occupational Therapy

## 2024-01-24 DIAGNOSIS — R278 Other lack of coordination: Secondary | ICD-10-CM | POA: Insufficient documentation

## 2024-01-24 DIAGNOSIS — R41840 Attention and concentration deficit: Secondary | ICD-10-CM | POA: Insufficient documentation

## 2024-01-24 DIAGNOSIS — R208 Other disturbances of skin sensation: Secondary | ICD-10-CM | POA: Insufficient documentation

## 2024-01-24 DIAGNOSIS — M6281 Muscle weakness (generalized): Secondary | ICD-10-CM | POA: Insufficient documentation

## 2024-01-24 DIAGNOSIS — R29898 Other symptoms and signs involving the musculoskeletal system: Secondary | ICD-10-CM | POA: Insufficient documentation

## 2024-01-24 DIAGNOSIS — R41844 Frontal lobe and executive function deficit: Secondary | ICD-10-CM | POA: Insufficient documentation

## 2024-01-24 DIAGNOSIS — R41842 Visuospatial deficit: Secondary | ICD-10-CM | POA: Insufficient documentation

## 2024-01-24 DIAGNOSIS — R29818 Other symptoms and signs involving the nervous system: Secondary | ICD-10-CM | POA: Insufficient documentation

## 2024-01-25 ENCOUNTER — Other Ambulatory Visit: Payer: Self-pay

## 2024-01-25 ENCOUNTER — Ambulatory Visit: Admitting: Occupational Therapy

## 2024-01-25 ENCOUNTER — Ambulatory Visit: Payer: Self-pay

## 2024-01-25 DIAGNOSIS — J4520 Mild intermittent asthma, uncomplicated: Secondary | ICD-10-CM

## 2024-01-25 DIAGNOSIS — R41844 Frontal lobe and executive function deficit: Secondary | ICD-10-CM

## 2024-01-25 DIAGNOSIS — R278 Other lack of coordination: Secondary | ICD-10-CM

## 2024-01-25 DIAGNOSIS — R41842 Visuospatial deficit: Secondary | ICD-10-CM | POA: Diagnosis present

## 2024-01-25 DIAGNOSIS — R208 Other disturbances of skin sensation: Secondary | ICD-10-CM

## 2024-01-25 DIAGNOSIS — R41840 Attention and concentration deficit: Secondary | ICD-10-CM

## 2024-01-25 DIAGNOSIS — R29898 Other symptoms and signs involving the musculoskeletal system: Secondary | ICD-10-CM | POA: Diagnosis present

## 2024-01-25 DIAGNOSIS — R29818 Other symptoms and signs involving the nervous system: Secondary | ICD-10-CM

## 2024-01-25 DIAGNOSIS — M6281 Muscle weakness (generalized): Secondary | ICD-10-CM

## 2024-01-25 MED ORDER — ALBUTEROL SULFATE HFA 108 (90 BASE) MCG/ACT IN AERS
2.0000 | INHALATION_SPRAY | RESPIRATORY_TRACT | 0 refills | Status: DC | PRN
  Filled 2024-01-25: qty 18, 17d supply, fill #0

## 2024-01-25 MED ORDER — ALBUTEROL SULFATE HFA 108 (90 BASE) MCG/ACT IN AERS: 2.0000 | INHALATION_SPRAY | RESPIRATORY_TRACT | 0 refills | Status: DC | PRN

## 2024-01-25 NOTE — Telephone Encounter (Signed)
 FYI Only or Action Required?: Action required by provider: would like an inhaler.  Denies sob at this moment.  Patient was last seen in primary care on 01/04/2024 by Newlin, Enobong, MD.  Called Nurse Triage reporting Advice Only.  Symptoms began today.  Interventions attempted: Nothing.  Symptoms are: stable.  Triage Disposition: No disposition on file.  Patient/caregiver understands and will follow disposition?:   Patient would like an inhaler called in for respiratory discomfort.  States she has used inhalers before in the past but is currently out.  Declined apt and triage.   Summary: rx req / inhaler discussion   The patient shares that they have began to experience some slight respiratory discomfort and would like to discuss their options for inhaler prescriptions with a member of clinical staff please

## 2024-01-25 NOTE — Telephone Encounter (Signed)
 Inhaler has been refilled and patient has been informed.

## 2024-01-25 NOTE — Therapy (Signed)
 OUTPATIENT OCCUPATIONAL THERAPY NEURO TREATMENT AND DISCHARGE  Patient Name: Chelsea Branch MRN: [id number] DOB:January 24, 1966, 58 y.o., female  Today's Date: 01/25/2024  OCCUPATIONAL THERAPY DISCHARGE SUMMARY  Visits from Start of Care: 7  Current functional level related to goals / functional outcomes: Patient has met 3/4 short-term goals and 2/3 long-term goals to date.   Remaining deficits: Pt limited by environmental stressors as well as cognition. She continues to see ST for treatment of cognitive impairments.    Education / Equipment: Continue with HEP and strategies following OT d/c to maximize function and maximize safety and overall level of independence.     Patient agrees to discharge. Patient goals were partially met. Patient is being discharged due to being pleased with the current functional level.Chelsea Branch   PCP: Delbert Clam, MD  REFERRING PROVIDER: Delbert Clam, MD  END OF SESSION:  OT End of Session - 01/25/24 1152     Visit Number 7    Number of Visits 7    Date for OT Re-Evaluation 01/27/24    Authorization Type [employer] Medicaid - no auth required per appt notes    OT Start Time 1149    OT Stop Time 1230    OT Time Calculation (min) 41 min    Activity Tolerance Patient tolerated treatment well    Behavior During Therapy [facility] for tasks assessed/performed         Past Medical History:  Diagnosis Date   Abnormal thyroid  blood test 03/15/2017   Abnormal uterine bleeding    Alopecia    Anemia    Angina pectoris with normal coronary arteriogram (HCC) 2017   Had + Troponin c/w ? 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  iniatially ?2/2 peripartum in 1994 - improved by 2008 then worsening EF in 2011 back down to EF 25-30%. b. echo 01/21/14 showed mod LVH, [name]; 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   Peripartum cardiomyopathy 1994   Sleep apnea 2015   CPAP 12/2013   Stroke (HCC) 2011   Systolic and diastolic CHF, acute on chronic (HCC) 06/14/2015   Tobacco abuse    Type 2 diabetes mellitus (HCC) 04/28/2023   Past Surgical History:  Procedure Laterality Date   CARDIAC CATHETERIZATION N/A 06/16/2015   Procedure: Left Heart Cath and Coronary Angiography;  Surgeon: Candyce GORMAN Reek, MD;  Location: [facility] INVASIVE CV LAB;  Service: Cardiovascular;  Laterality: N/A;   CESAREAN SECTION  1992  1994   INTRAUTERINE DEVICE (IUD) INSERTION     about 3 years ago   LOOP RECORDER IMPLANT  ~ 2000   spine injections      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) 04/28/2023   History of cerebellar stroke 04/13/2023   History of stroke 04/11/2023   Liletta  IUD (intrauterine device) in place since 12/15/2015 04/06/2023   Chronic pain syndrome 12/28/2021   Primary osteoarthritis of both knees 12/28/2021   Spinal stenosis of lumbar region 07/21/2021   Lumbar radiculitis 07/21/2021   COPD (chronic obstructive pulmonary disease) (HCC)  07/21/2021   Mechanical complication due to intrauterine contraceptive device 07/21/2021   Arthropathy of lumbar facet joint 03/14/2019   Degenerative disc disease at L5-S1 level 10/10/2018   Hyperthyroidism 04/26/2016   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   History of non-ST elevation myocardial infarction (NSTEMI) 06/14/2015   Homelessness 09/16/2014   Vitamin D  deficiency 01/29/2014   OSA (obstructive sleep apnea) 01/08/2014   Chronic combined systolic and diastolic CHF (congestive heart failure) (HCC) 08/14/2013   Essential hypertension    Fibroids 08/09/2012   Menorrhagia 05/08/2012   Female pattern hair loss    HLD (hyperlipidemia) 06/08/2010   CEREBRAL ANEURYSM 06/08/2010   History of cardiovascular disorder 06/08/2010    ONSET DATE: 10/24/2023 (referral)   REFERRING DIAG: I69.30 (ICD-10-CM) - History of stroke with residual effects   THERAPY DIAG:  Other lack of coordination  Muscle weakness (generalized)  Attention and concentration deficit  Frontal lobe and executive function deficit  Visuospatial deficit  Other symptoms and signs involving the nervous system  Other disturbances of skin sensation  Other symptoms and signs involving the musculoskeletal system  Rationale for Evaluation and Treatment: Rehabilitation  SUBJECTIVE:   SUBJECTIVE STATEMENT:  Pt reports she has not checked BP in the last week due to stress from family deaths. She states she is going to get back to checking this daily.   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   PRECAUTIONS: Fall  WEIGHT BEARING RESTRICTIONS: No  PAIN:  Are you having pain? NA  FALLS: Has patient fallen  in last 6 months? No  LIVING ENVIRONMENT: Lives with: lives with their partner Lives in: House/apartment Stairs: No Has following equipment at home: Vannie - 4 wheeled  PLOF: Requires assistive device for independence; worked in a Chemical engineer; does not have a car to drive; likes baking and cooking; reading; Archivist; crocheting  PATIENT GOALS: To improve level of activity during the day; join gym; get into a home of her own  OBJECTIVE:  Note: Objective measures were completed at Evaluation unless otherwise noted.  HAND DOMINANCE: Right  ADLs: Overall ADLs: mod I  Equipment: had shower chair but lost her storage unit and is homeless  IADLs:  Light housekeeping: sweeping and mopping are a little more difficult and she has to sit or take breaks between  Handwriting: pt reports her handwriting is about 10% of prior level  MOBILITY STATUS: Needs Assist: Ambulates with use of rollator  ACTIVITY TOLERANCE: Activity tolerance: fair to poor  FUNCTIONAL OUTCOME MEASURES: Quick Dash: 40.9 % disability  01/25/2024: 4.5 % disability with use of BUE  UPPER EXTREMITY ROM:    BUE: WNL  UPPER EXTREMITY MMT:  BUE: WFL though poor endurance  HAND FUNCTION: Grip strength: Right: 37.2 lbs; Left: 36.5 lbs  COORDINATION: 9 Hole Peg test: Right: 24 sec; Left: 25 sec  SENSATION: Paresthesias in RUE from previous work-related accident  EDEMA: mild reported and observed  MUSCLE TONE: WFL  COGNITION: Overall cognitive status: Impaired  VISION: Subjective report: Pt has difficulty seeing items on left side and has spots in vision that are not clear. She turns head to left side to be able to see things. Baseline vision: does not have corrected lenses Visual history: Thyroid  eye disease  VISION ASSESSMENT: Reading and distance acuity: WFL  45* left peripheral vision; R peripheral vision WFL  PERCEPTION: Impaired: Spatial orientation: unable to orient to  dry erase marker; reports difficulty with writing to make letters correctly  PRAXIS: Impaired: Motor planning  OBSERVATIONS: Pt ambulates with use of rollator. No loss of balance. The pt appears fairly well kept and has mask donned.                                                                                                                           TODAY'S TREATMENT :    - Self Care education completed for duration as noted below including:  Objective measures assessed as noted in Goals section to determine progression towards goals. Therapist reviewed goals with patient and updated patient progression.  No additional functional limitations identified. PATIENT EDUCATION: Education details: Goal progression; OT d/c Person educated: Patient Education method: Explanation, Demonstration, and Verbal cues Education comprehension: verbalized understanding, returned demonstration, and verbal cues required  HOME EXERCISE PROGRAM: 12/19/23: Visual Scanning and Visual Compensation 12/27/2023: golf solitaire; EC strategies; putty HEP 01/02/2024: sleep hygiene 01/17/24: coordination activities  GOALS:  SHORT TERM GOALS: Target date: 12/27/2023    Patient will demonstrate initial UE HEP with 25% verbal cues or less for proper execution. Baseline: Goal status: MET  2.  Patient will independently verbalize at least 3 energy conservation principles in relation to ADLs to increase functional independence. Baseline:  Goal status: IN PROGRESS  3.  Patient will independently recall at least 2 compensatory strategies for visual impairment without cueing. Baseline:  Goal status: MET 01/17/24 - Needs eye exam - still feels things are blurry, has magnify glass,   4.  Pt will participate in creating a daily schedule as needed to form new routines and improve overall mental health. Baseline:  Goal status: MET 8/26 - keeping track of BP/weights (need to ensure carryover of HEP ideas and walking)  LONG  TERM GOALS: Target date: 01/27/2024    Patient will demonstrate UE HEP with visual handouts only for proper execution. Baseline:  Goal status: IN Progress  2.  Patient will demonstrate at least 16% improvement with quick Dash score (reporting 24.9% disability or less) indicating improved functional use of affected extremity. Baseline: 40.9 % disability 01/25/2024: 4.5% disability Goal status: MET  3.  Patient will independently recall at least 3 strategies for better sleep/sleep hygiene. Baseline:  Goal status:MET 01/17/24 - little better, but reports stressors ie) lots of people passed away, finding a place, etc  ASSESSMENT:  CLINICAL IMPRESSION: Patient is appropriate for discharge and no longer demonstrates medical necessity for continued skilled occupational therapy services. She will continue with ST services to address remaining cognitive deficits.  PLAN:  OT D/C Completed  CONSULTED AND AGREED WITH PLAN OF CARE: Patient  Jocelyn CHRISTELLA Bottom, OT 01/25/2024, 2:00 PM

## 2024-01-26 ENCOUNTER — Other Ambulatory Visit: Payer: Self-pay

## 2024-01-26 NOTE — Patient Outreach (Addendum)
 BSW spoke with patient this morning to follow up on resources sent since the last call. Patient confirmed receipt of the information and reported that she has begun to receive her SSI benefits. Patient shared that she has spoken with the [facility] and will be receiving assistance from them. She also confirmed that she received information on housing and plans to follow up on those resources. Patient expressed a need for additional resources related to household items. BSW will send patient the requested resources and will close the case following completion of this task. BSW informed patient that if she had any further questions to feel free to reach out.   Resources for household items sent: [facility] [address] [place], [place], [place] [address] Phone: [phone]  [facility] [address] [place], [place], [place] [address] Phone: [phone]  [facility] [address] [place], [place] [address] Phone: [phone]  [employer] [place] [address] [place], [place] [address] Phone: [phone]  Orlean Fey, BSW [place]  [facility] Social Worker, [facility] [phone]

## 2024-01-26 NOTE — Patient Instructions (Signed)

## 2024-01-27 ENCOUNTER — Other Ambulatory Visit: Payer: Self-pay | Admitting: *Deleted

## 2024-01-27 NOTE — Pre-Procedure Instructions (Signed)
 Instructed patient on the following items: Arrival time 1230 Nothing to eat or drink after midnight No meds AM of procedure Responsible person to drive you home and stay with you for 24 hrs Wash with special soap night before and morning of procedure

## 2024-01-27 NOTE — Patient Instructions (Signed)
 Visit Information  Thank you for taking time to visit with me today. Please don't hesitate to contact me if I can be of assistance to you before our next scheduled appointment.  Your next care management appointment is by telephone on 02/24/2024 at 10:00 am   Please call the care guide team at [phone] if you need to cancel, schedule, or reschedule an appointment.   Please call the Suicide and Crisis Lifeline: 988 call the [place]  National Suicide Prevention Lifeline: 873-781-8781 or TTY: 613-527-9228 TTY 539-247-8708) to talk to a trained counselor call 1-800-273-TALK (toll free, 24 hour hotline) if you are experiencing a Mental Health or Behavioral Health Crisis or need someone to talk to.   Olam Ku, RN, BSN [place]  [facility], [facility] RN Care Manager Direct Dial: [phone]  Fax: [phone]

## 2024-01-27 NOTE — Patient Outreach (Signed)
 Complex Care Management   Visit Note  01/27/2024  Name:  Chelsea Branch MRN: [id number] DOB: 09-Mar-1966  Situation: Referral received for Complex Care Management related to Heart Failure I obtained verbal consent from Patient.  Visit completed with Patient  on the phone  Background:   Past Medical History:  Diagnosis Date   Abnormal thyroid  blood test 03/15/2017   Abnormal uterine bleeding    Alopecia    Anemia    Angina pectoris with normal coronary arteriogram (HCC) 2017   Had + Troponin c/w ? 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 iniatially ?2/2 peripartum in 1994 - improved by 2008 then worsening EF in 2011 back down to EF 25-30%. b. echo 01/21/14 showed mod LVH, [name]; 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   Peripartum cardiomyopathy 1994   Sleep apnea 2015   CPAP 12/2013   Stroke (HCC) 2011   Systolic and diastolic CHF, acute on chronic (HCC) 06/14/2015   Tobacco abuse    Type 2 diabetes mellitus (HCC) 04/28/2023    Assessment: Patient Reported Symptoms:  Cognitive Cognitive Status: Alert and oriented to person, place, and time, Able to follow simple commands, Normal speech and language skills      Neurological Neurological Review of Symptoms: No symptoms reported    HEENT HEENT Symptoms Reported: No symptoms reported      Cardiovascular Cardiovascular Symptoms Reported: No symptoms reported (Denies any HF symptoms at this time) Does patient have uncontrolled Hypertension?:  No Cardiovascular Management Strategies: Medication therapy, Routine screening Weight: 206 lb (93.4 kg)  Respiratory Respiratory Symptoms Reported: No symptoms reported Respiratory Management Strategies: Routine screening, Medication therapy, Adequate rest, Breathing techniques  Endocrine Endocrine Symptoms Reported: No symptoms reported Is patient diabetic?: Yes Is patient checking blood sugars at home?: No (Recent A1C 01/04/2024 7.4 increased from last read at 6.7 May. Patient adherent with Ozempic  medication.)    Gastrointestinal Gastrointestinal Symptoms Reported: No symptoms reported      Genitourinary Genitourinary Symptoms Reported: No symptoms reported    Integumentary Integumentary Symptoms Reported: No symptoms reported    Musculoskeletal Musculoskelatal Symptoms Reviewed: No symptoms reported        Psychosocial Psychosocial Symptoms Reported: No symptoms reported          01/27/2024    PHQ2-9 Depression Screening   Little interest or pleasure in doing things    Feeling down, depressed, or hopeless    PHQ-2 - Total Score    Trouble falling or staying asleep, or sleeping too much    Feeling tired or having little energy    Poor appetite or overeating     Feeling bad about yourself - or that you are a failure or have let yourself or your family down    Trouble concentrating on things, such as reading the newspaper or watching television    Moving or speaking so slowly that other people could have noticed.  Or the opposite - being so fidgety or restless that you  have been moving around a lot more than usual    Thoughts that you would be better off dead, or hurting yourself in some way    PHQ2-9 Total Score    If you checked off any problems, how difficult have these problems made it for you to do your work, take care of things at home, or get along with other people    Depression Interventions/Treatment      There were no vitals filed for this visit.  Medications  Reviewed Today     Reviewed by Alvia Olam BIRCH, RN (Registered Nurse) on 01/27/24 at 1338  Med List Status: <None>   Medication Order Taking? Sig Documenting Provider Last Dose Status Informant  acetaminophen  (TYLENOL ) 500 MG tablet [id number] Yes Take 1,000 mg by mouth as needed for mild pain (pain score 1-3) or moderate pain (pain score 4-6). [provider]  Active Self  albuterol  (VENTOLIN  HFA) 108 (90 Base) MCG/ACT inhaler [id number] Yes Inhale 2 puffs into the lungs every 4 (four) hours as needed for wheezing or shortness of breath. Newlin, Enobong, MD  Active Self  aspirin  81 MG chewable tablet [id number] Yes Chew 1 tablet (81 mg total) by mouth daily. Lenard Calin, MD  Active Self  atorvastatin  (LIPITOR) 40 MG tablet [id number] Yes Take 1 tablet (40 mg total) by mouth daily. Newlin, Enobong, MD  Active Self  FLUoxetine  (PROZAC ) 20 MG capsule [id number] Yes Take 1 capsule (20 mg total) by mouth daily. Newlin, Enobong, MD  Active Self  fluticasone  (FLONASE ) 50 MCG/ACT nasal spray [id number]  Place 2 sprays into both nostrils daily.  Patient taking differently: Place 2 sprays into both nostrils daily as needed for allergies.   Rising, Asberry, PA-C  Active Self  hydrOXYzine  (ATARAX ) 25 MG tablet [id number]  Take 1 tablet (25 mg total) by mouth at bedtime as needed.  Patient not taking: Reported on 01/27/2024   Newlin, Enobong, MD  Active Self  ketoconazole  (NIZORAL ) 2 % shampoo [id number] Yes Apply 1 Application topically 2 (two) times a week. Theotis Haze ORN, NP  Active Self  methimazole  (TAPAZOLE ) 5 MG tablet [id number] Yes Take 1 tablet (5 mg total) by mouth 2 (two) times daily. Newlin, Enobong, MD  Active Self  metoprolol  succinate (TOPROL -XL) 50 MG 24 hr tablet [id number] Yes Take 1.5 tablets (75 mg total) by mouth daily. Take with or immediately following a meal. Thukkani, Arun K, MD  Active Self  Misc. Devices MISC [id number]  Blood Pressure Monitor Delbert Clam, MD  Active Self   polyethylene glycol powder (GLYCOLAX /MIRALAX ) 17 GM/SCOOP powder [id number] Yes Take 17 g by mouth daily. Newlin, Enobong, MD  Active Self  polyethylene glycol powder (GLYCOLAX /MIRALAX ) 17 GM/SCOOP powder [id number]  Dissolve 17 grams into water and drink by mouth every day.  Patient not taking: Reported on 01/27/2024     Active Self  potassium chloride  SA (KLOR-CON  M20) 20 MEQ tablet [id number] Yes Take 2 tablets (40 mEq total) by mouth daily. Newlin, Enobong, MD  Active Self  sacubitril -valsartan  (ENTRESTO ) 49-51 MG [id number] Yes Take 1 tablet by mouth 2 (two) times daily. Thukkani, Arun K, MD  Active Self  Semaglutide , 1 MG/DOSE, 4 MG/3ML SOPN [id number] Yes Inject 1 mg as directed once a week. Newlin, Enobong, MD  Active Self  Semaglutide ,0.25 or 0.5MG /DOS, (OZEMPIC , 0.25 OR 0.5 MG/DOSE,) 2 MG/3ML SOPN [id number]  Inject 0.25 mg into the skin once a week. For 4 weeks then increase to 0.5mg   Patient taking differently: Inject 0.25 mg into the  skin every Wednesday. For 4 weeks then increase to 0.5mg    Newlin, Enobong, MD  Active Self  Semaglutide ,0.25 or 0.5MG /DOS, (OZEMPIC , 0.25 OR 0.5 MG/DOSE,) 2 MG/3ML SOPN [id number] Yes Inject 0.5 mg into the skin once a week. For 4 weeks then increase to 1mg  Newlin, Enobong, MD  Active Self  spironolactone  (ALDACTONE ) 25 MG tablet [id number] Yes Take 1 tablet (25 mg total) by mouth daily. Thukkani, Arun K, MD  Active Self  torsemide  (DEMADEX ) 20 MG tablet [id number] Yes Take 1 tablet (20 mg total) by mouth daily. Newlin, Enobong, MD  Active Self  triamcinolone  cream (KENALOG ) 0.1 % [id number]  Apply 1 Application topically 2 (two) times daily.  Patient taking differently: Apply 1 Application topically 2 (two) times daily as needed (skin irritation.).   Newlin, Enobong, MD  Active Self  Med List Note Beverlee Reyes LELON Bishop 04/09/17 2221):              Recommendation:   PCP Follow-up Continue Current Plan of Care  Follow Up Plan:   Telephone follow up  appointment date/time:  02/24/2024 @ 10:00 am   Olam Ku, RN, BSN [place]  [facility], [facility] RN Care Manager Direct Dial: [phone]  Fax: [phone]

## 2024-01-29 ENCOUNTER — Ambulatory Visit: Payer: Self-pay | Admitting: Cardiology

## 2024-01-30 ENCOUNTER — Ambulatory Visit (HOSPITAL_COMMUNITY)

## 2024-01-30 ENCOUNTER — Other Ambulatory Visit: Payer: Self-pay

## 2024-01-30 ENCOUNTER — Ambulatory Visit (HOSPITAL_COMMUNITY)
Admission: RE | Admit: 2024-01-30 | Discharge: 2024-01-30 | Disposition: A | Discharge status: Home / Self Care | Attending: Cardiology | Admitting: Cardiology

## 2024-01-30 ENCOUNTER — Ambulatory Visit (HOSPITAL_COMMUNITY)
Admission: RE | Disposition: A | Discharge status: Home / Self Care | Payer: Self-pay | Source: Home / Self Care | Attending: Cardiology

## 2024-01-30 DIAGNOSIS — Z8249 Family history of ischemic heart disease and other diseases of the circulatory system: Secondary | ICD-10-CM | POA: Diagnosis not present

## 2024-01-30 DIAGNOSIS — Z8673 Personal history of transient ischemic attack (TIA), and cerebral infarction without residual deficits: Secondary | ICD-10-CM | POA: Diagnosis not present

## 2024-01-30 DIAGNOSIS — I429 Cardiomyopathy, unspecified: Secondary | ICD-10-CM

## 2024-01-30 DIAGNOSIS — I5022 Chronic systolic (congestive) heart failure: Secondary | ICD-10-CM | POA: Diagnosis not present

## 2024-01-30 DIAGNOSIS — I5042 Chronic combined systolic (congestive) and diastolic (congestive) heart failure: Secondary | ICD-10-CM

## 2024-01-30 HISTORY — PX: ICD IMPLANT: EP1208

## 2024-01-30 LAB — PREGNANCY, URINE: Preg Test, Ur: NEGATIVE

## 2024-01-30 LAB — BASIC METABOLIC PANEL WITH GFR
Anion gap: 12 (ref 5–15)
BUN: 11 mg/dL (ref 6–20)
CO2: 23 mmol/L (ref 22–32)
Calcium: 8.7 mg/dL — ABNORMAL LOW (ref 8.9–10.3)
Chloride: 104 mmol/L (ref 98–111)
Creatinine, Ser: 0.72 mg/dL (ref 0.44–1.00)
GFR, Estimated: >60 mL/min (ref >60)
Glucose, Bld: 94 mg/dL (ref 70–99)
Potassium: 3 mmol/L — ABNORMAL LOW (ref 3.5–5.1)
Sodium: 139 mmol/L (ref 135–145)

## 2024-01-30 LAB — GLUCOSE, CAPILLARY: Glucose-Capillary: 73 mg/dL (ref 70–99)

## 2024-01-30 SURGERY — ICD IMPLANT

## 2024-01-30 MED ORDER — TRAMADOL HCL 50 MG PO TABS
50.0000 mg | ORAL_TABLET | Freq: Once | ORAL | Status: AC
  Administered 2024-01-30: 50 mg via ORAL

## 2024-01-30 MED ORDER — TRAMADOL HCL 50 MG PO TABS
ORAL_TABLET | ORAL | Status: AC
  Filled 2024-01-30: qty 1

## 2024-01-30 MED ORDER — SODIUM CHLORIDE 0.9 % IV SOLN: 80.0000 mg | INTRAVENOUS | Status: AC

## 2024-01-30 MED ORDER — ACETAMINOPHEN 325 MG PO TABS: 325.0000 mg | ORAL_TABLET | ORAL | Status: DC | PRN

## 2024-01-30 MED ORDER — MIDAZOLAM HCL 2 MG/2ML IJ SOLN
INTRAMUSCULAR | Status: AC
  Filled 2024-01-30: qty 2

## 2024-01-30 MED ORDER — POVIDONE-IODINE 10 % EX SWAB
2.0000 | Freq: Once | CUTANEOUS | Status: AC
  Administered 2024-01-30: 2 via TOPICAL

## 2024-01-30 MED ORDER — POTASSIUM CHLORIDE CRYS ER 20 MEQ PO TBCR
60.0000 meq | EXTENDED_RELEASE_TABLET | Freq: Every day | ORAL | 1 refills | Status: AC
  Filled 2024-01-30 – 2024-03-27 (×5): qty 180, 60d supply, fill #0
  Filled 2024-05-22: qty 180, 60d supply, fill #1

## 2024-01-30 MED ORDER — HEPARIN (PORCINE) IN NACL 1000-0.9 UT/500ML-% IV SOLN
INTRAVENOUS | Status: DC | PRN
  Administered 2024-01-30: 500 mL

## 2024-01-30 MED ORDER — CEFAZOLIN SODIUM-DEXTROSE 2-4 GM/100ML-% IV SOLN
INTRAVENOUS | Status: AC
  Administered 2024-01-30: 2 g via INTRAVENOUS
  Filled 2024-01-30: qty 100

## 2024-01-30 MED ORDER — LIDOCAINE HCL (PF) 1 % IJ SOLN
INTRAMUSCULAR | Status: DC | PRN
  Administered 2024-01-30: 30 mL

## 2024-01-30 MED ORDER — CHLORHEXIDINE GLUCONATE 4 % EX SOLN
4.0000 | Freq: Once | CUTANEOUS | Status: DC
  Filled 2024-01-30: qty 60

## 2024-01-30 MED ORDER — LIDOCAINE HCL (PF) 1 % IJ SOLN
INTRAMUSCULAR | Status: AC
  Filled 2024-01-30: qty 60

## 2024-01-30 MED ORDER — POTASSIUM CHLORIDE CRYS ER 20 MEQ PO TBCR
60.0000 meq | EXTENDED_RELEASE_TABLET | Freq: Once | ORAL | Status: AC
  Administered 2024-01-30: 60 meq via ORAL
  Filled 2024-01-30: qty 3

## 2024-01-30 MED ORDER — FENTANYL CITRATE (PF) 100 MCG/2ML IJ SOLN
INTRAMUSCULAR | Status: DC | PRN
  Administered 2024-01-30: 50 ug via INTRAVENOUS
  Administered 2024-01-30 (×2): 25 ug via INTRAVENOUS

## 2024-01-30 MED ORDER — ONDANSETRON HCL 4 MG/2ML IJ SOLN: 4.0000 mg | Freq: Four times a day (QID) | INTRAMUSCULAR | Status: DC | PRN

## 2024-01-30 MED ORDER — FENTANYL CITRATE (PF) 100 MCG/2ML IJ SOLN
INTRAMUSCULAR | Status: AC
  Filled 2024-01-30: qty 2

## 2024-01-30 MED ORDER — CEFAZOLIN SODIUM-DEXTROSE 2-4 GM/100ML-% IV SOLN: 2.0000 g | INTRAVENOUS | Status: AC

## 2024-01-30 MED ORDER — SODIUM CHLORIDE 0.9 % IV SOLN
INTRAVENOUS | Status: AC
  Administered 2024-01-30: 80 mg
  Filled 2024-01-30: qty 2

## 2024-01-30 MED ORDER — SODIUM CHLORIDE 0.9 % IV SOLN: INTRAVENOUS | Status: DC

## 2024-01-30 MED ORDER — MIDAZOLAM HCL 5 MG/5ML IJ SOLN
INTRAMUSCULAR | Status: DC | PRN
  Administered 2024-01-30 (×2): 1 mg via INTRAVENOUS
  Administered 2024-01-30: .5 mg via INTRAVENOUS

## 2024-01-30 SURGICAL SUPPLY — 7 items
CABLE SURGICAL S-101-97-12 (CABLE) ×1 IMPLANT
ICD VIGILANT VR D232 (Pacemaker) IMPLANT
LEAD RELIANCE 0673 IMPLANT
PAD DEFIB RADIO PHYSIO CONN (PAD) ×1 IMPLANT
SHEATH 8FR PRELUDE SNAP 13 (SHEATH) IMPLANT
SHEATH PROBE COVER 6X72 (BAG) IMPLANT
TRAY PACEMAKER INSERTION (PACKS) ×1 IMPLANT

## 2024-01-30 NOTE — Discharge Instructions (Signed)
 After Your ICD (Implantable Cardiac Defibrillator)   You have a [employer] ICD  If you have a [employer]onic or [employer] device, plug in your home monitor once you get home, and no manual interaction is required.   If you have an [employer] or [employer] device, plug your home monitor once you get home, sit near the device, and press the large activation button. Sit nearby until the process is complete, usually notated by lights on the monitor.   If you were set up for monitoring using an app on your phone, make sure the app remains open in the background and the Bluetooth remains on.  ACTIVITY Do not lift your arm above shoulder height for 1 week after your procedure. After 7 days, you may progress as below.  You should remove your sling 24 hours after your procedure, unless otherwise instructed by your provider.     Monday February 06, 2024  Tuesday February 07, 2024 Wednesday February 08, 2024 Thursday February 09, 2024   Do not lift, push, pull, or carry anything over 10 pounds with the affected arm until 6 weeks (Monday March 12, 2024 ) after your procedure.   You may drive AFTER your wound check, unless you have been told otherwise by your provider.   Ask your healthcare provider when you can go back to work   INCISION/Dressing  If large square, outer bandage is left in place, this can be removed after 24 hours from your procedure. Do not remove steri-strips or glue as below.   Monitor your defibrillator site for redness, swelling, and drainage. Call the device clinic at [phone] if you experience these symptoms or fever/chills.  If your incision is sealed with Steri-strips or staples, you may shower 7 days after your procedure or when told by your provider. Do not remove the steri-strips or let the shower hit directly on your site. You may wash around your site with soap and water.    If you were discharged in a sling, please do not wear this during the day  more than 48 hours after your surgery unless otherwise instructed. This may increase the risk of stiffness and soreness in your shoulder.   Avoid lotions, ointments, or perfumes over your incision until it is well-healed.  You may use a hot tub or a pool AFTER your wound check appointment if the incision is completely closed.  Your ICD is designed to protect you from life threatening heart rhythms. Because of this, you may receive a shock.   1 shock with no symptoms:  Call the office during business hours. 1 shock with symptoms (chest pain, chest pressure, dizziness, lightheadedness, shortness of breath, overall feeling unwell):  Call 911. If you experience 2 or more shocks in 24 hours:  Call 911. If you receive a shock, you should not drive for 6 months per the [place] DMV IF you receive appropriate therapy from your ICD.   ICD Alerts:  Some alerts are vibratory and others beep. These are NOT emergencies. Please call our office to let us  know. If this occurs at night or on weekends, it can wait until the next business day. Send a remote transmission.  If your device is capable of reading fluid status (for heart failure), you will be offered monthly monitoring to review this with you.   DEVICE MANAGEMENT Remote monitoring is used to monitor your ICD from home. This monitoring is scheduled every 91 days by our office. It allows us  to keep an eye  on the functioning of your device to ensure it is working properly. You will routinely see your Electrophysiologist annually (more often if necessary). This will appear as a REMOTE check on your MyChart schedule. These are automatic and there is nothing for you to manually do unless otherwise instructed.  You should receive your ID card for your new device in 4-8 weeks. Keep this card with you at all times once received. Consider wearing a medical alert bracelet or necklace.  Your ICD  may be MRI compatible. This will be discussed at your next office  visit/wound check.  You should avoid contact with strong electric or magnetic fields.   Do not use amateur (ham) radio equipment or electric (arc) welding torches. MP3 player headphones with magnets should not be used. Some devices are safe to use if held at least 12 inches (30 cm) from your defibrillator. These include power tools, lawn mowers, and speakers. If you are unsure if something is safe to use, ask your health care provider.  When using your cell phone, hold it to the ear that is on the opposite side from the defibrillator. Do not leave your cell phone in a pocket over the defibrillator.  You may safely use electric blankets, heating pads, computers, and microwave ovens.  Call the office right away if: You have chest pain. You feel more than one shock. You feel more short of breath than you have felt before. You feel more light-headed than you have felt before. Your incision starts to open up.  This information is not intended to replace advice given to you by your health care provider. Make sure you discuss any questions you have with your health care provider.

## 2024-01-30 NOTE — H&P (Addendum)
 Electrophysiology Note:   Date:  01/30/24  ID:  Chelsea Branch, DOB November 02, 1965, MRN [id number]   Primary Cardiologist: Elspeth Sage, MD Electrophysiologist: [name], MD       History of Present Illness:   Chelsea Branch is a 58 y.o. 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   Discussed the use of AI scribe software for clinical note transcription with the patient, who gave verbal consent to proceed.   History of Present Illness Chelsea Branch is a 58 year old female with heart failure and stroke who presents for evaluation of defibrillator placement. She has a history of fluctuating heart function since 1994, coinciding with the birth of her twins. Her heart function has been monitored over the years, and a recent ultrasound showed her ejection fraction remains low at 30-35%. She experienced a stroke on November 17th of the previous year while at work, which was preceded by a severe headache. She continues to experience headaches and frequent tiredness, which she attributes to her weakened heart. Her family has a history of heart problems, including her mother who had high blood pressure and heart failure. She is currently using a walker due to falls and has been attending physical therapy. She expresses that not being able to work has taken a toll on her.  Interval: Patient presents today for scheduled ICD implant. Reports feeling relatively well. No new or acute complaints.    Review of systems complete and found to be negative unless listed in HPI.    EP Information / Studies Reviewed:      EKG Interpretation Date/Time:                  Friday December 16 2023 11:59:10 EDT Ventricular Rate:         72 PR Interval:                 184 QRS Duration:             96 QT Interval:                 406 QTC Calculation:444 R Axis:                         -24   Text  Interpretation:Normal sinus rhythm Left ventricular hypertrophy with repolarization abnormality ( R in aVL ) When compared with ECG of 21-Jun-2023 10:49, No significant change was found Confirmed by Kitty Fonda [phone]) on 12/16/2023 12:49:18 PM    Echo 12/08/23:   1. Left ventricular ejection fraction, by estimation, is 30 to 35%. The  left ventricle has moderately decreased function. The left ventricle  demonstrates global hypokinesis. The left ventricular internal cavity size  was moderately dilated. Left  ventricular diastolic parameters are consistent with Grade I diastolic  dysfunction (impaired relaxation).   2. Right ventricular systolic function is mildly reduced. The right  ventricular size is normal. Tricuspid regurgitation signal is inadequate  for assessing PA pressure.   3. Left atrial size was mild to moderately dilated.   4. The mitral valve is normal in structure. Mild mitral valve  regurgitation. No evidence of mitral stenosis.   5. The aortic valve is tricuspid. Aortic valve regurgitation is not  visualized. No aortic stenosis is present.   6. The inferior vena cava is normal in size with greater than 50%  respiratory variability, suggesting  right atrial pressure of 3 mmHg.        Physical Exam:    Today's Vitals   01/30/24 1257 01/30/24 1312  BP: (!) 116/92   Pulse: 82   Resp: 17   Temp: 98.4 F (36.9 C)   TempSrc: Oral   SpO2: 95%   Weight: 93.4 kg   Height: 5' 8 (1.727 m)   PainSc:  0-No pain   Body mass index is 31.32 kg/m.     GEN: Well nourished, well developed in no acute distress NECK: No JVD CARDIAC: Normal rate, regular rhythm RESPIRATORY:  Clear to auscultation without rales, wheezing or rhonchi  ABDOMEN: Soft, non-distended EXTREMITIES:  No edema; No deformity    ASSESSMENT AND PLAN:     #. Chronic systolic heart failure: NYHA class II.  #. NICM:  - Patient meets criteria for primary prevention ICD in the setting of LV systolic  function less than 35% despite greater than 90 days of medical therapy.  She has narrow QRS.  Will plan for VVI ICD due to h/o cryptogenic stroke. Explained risks, benefits, and alternatives to ICD implantation, including but not limited to bleeding, infection, damage to heart or lungs, heart attack, stroke, or death.  Pt verbalized understanding and wants to proceed. - Continue GDMT regimen of metoprolol , Entresto , spironolactone .   #. Cryptogenic stroke: - We will be able to monitor for AF w/ RVR with ICD.  No known history of atrial fibrillation.  Plan for VVI ICD.     Follow up with Dr. Kennyth 3 months after implant.    Signed, Fonda Kennyth, MD

## 2024-01-30 NOTE — Progress Notes (Signed)
 Client cont to c/o 5/10 surgical site pain and Dr Kennyth notified and per Dr Kennyth ok to d/c home; client advised per Dr Kennyth to call if increased pain at home and client and her son notified and voiced understanding; left upper chest with dressing clean, dry , and intact, small area of old red drainage size of dime

## 2024-01-31 ENCOUNTER — Encounter (HOSPITAL_COMMUNITY): Payer: Self-pay | Admitting: Cardiology

## 2024-01-31 ENCOUNTER — Ambulatory Visit: Payer: Self-pay

## 2024-01-31 ENCOUNTER — Other Ambulatory Visit: Payer: Self-pay

## 2024-01-31 ENCOUNTER — Telehealth: Payer: Self-pay

## 2024-01-31 ENCOUNTER — Telehealth: Payer: Self-pay | Admitting: Cardiology

## 2024-01-31 DIAGNOSIS — I5042 Chronic combined systolic (congestive) and diastolic (congestive) heart failure: Secondary | ICD-10-CM

## 2024-01-31 DIAGNOSIS — I428 Other cardiomyopathies: Secondary | ICD-10-CM

## 2024-01-31 MED FILL — Midazolam HCl Inj 2 MG/2ML (Base Equivalent): INTRAMUSCULAR | Qty: 2.5 | Status: AC

## 2024-01-31 NOTE — Telephone Encounter (Signed)
 Patient stated she has been sore on the left side of her chest and wants advice on next steps.

## 2024-01-31 NOTE — Telephone Encounter (Signed)
 Left message for patient advising on recommendations from Dr. Kennyth. Referral has been placed and labs have been ordered. Advised patient to call back with any questions.

## 2024-01-31 NOTE — Telephone Encounter (Signed)
 FYI Only or Action Required?: Action required by provider: Requesting pain medication. Instructed to call cardiologist, pt reports already called. Advised ED if symptoms worsen.  Patient was last seen in primary care on 01/04/2024 by Newlin, Enobong, MD.  Called Nurse Triage reporting implant.  Symptoms began yesterday.  Interventions attempted: Prescription medications: Tramadol .  Symptoms are: gradually worsening.  Triage Disposition: Home Care  Patient/caregiver understands and will follow disposition?: No, wishes to speak with PCP      Reason for Disposition  Chest pain(s) lasting a few seconds  Answer Assessment - Initial Assessment Questions Patient requesting stronger pain medication. S/p ICD implant yesterday, 01/30/24. Patient reports already called cardiologist and waiting for call back.  Advised patient to go to ED if symptoms worsen.   1. LOCATION: Where does it hurt?       Left side chest,  soreness from implant/procedure 01/30/24. Pt reports only received Tramadol ; havent' been able to sleep. Requesting another medication, not effective. 2. RADIATION: Does the pain go anywhere else? (e.g., into neck, jaw, arms, back)     Left sided soreness of chest 3. ONSET: When did the chest pain begin? (Minutes, hours or days)      yesterday 4. PATTERN: Does the pain come and go, or has it been constant since it started?  Does it get worse with exertion?      constant 5. DURATION: How long does it last (e.g., seconds, minutes, hours)     constant 6. SEVERITY: How bad is the pain?  (e.g., Scale 1-10; mild, moderate, or severe)     9/10; last took Tramadol  at 0600 7. CARDIAC RISK FACTORS: Do you have any history of heart problems or risk factors for heart disease? (e.g., angina, prior heart attack; diabetes, high blood pressure, high cholesterol, smoker, or strong family history of heart disease) yes  9. CAUSE: What do you think is causing the chest pain?      implant 10. OTHER SYMPTOMS: Do you have any other symptoms? (e.g., dizziness, nausea, vomiting, sweating, fever, difficulty breathing, cough)       Denies n/v, diff breathing, dizziness, sweating  Protocols used: Chest Pain-A-AH

## 2024-01-31 NOTE — Telephone Encounter (Signed)
-----   Message from [name] sent at 01/30/2024  3:58 PM EDT ----- Regarding: HF Clinic and labs Patient needs to establish care with HF Clinic. Needs repeat BMP in 1 week for hypokalemia.   Chelsea Branch

## 2024-01-31 NOTE — Telephone Encounter (Signed)
 Caller disconnected during transfer; attempted call back; no answer.   Copied from CRM [id number]. Topic: Clinical - Red Word Triage >> Jan 31, 2024 10:00 AM Jasmin G wrote: [facility] that prompted transfer to Nurse Triage: Pt states that she had an ICD implant placed yesterday and she's feeling really sore and has not been able to sleep properly due to the pain.

## 2024-01-31 NOTE — Telephone Encounter (Signed)
 Patient advised to take tylenol  every 4-6 hours prn and apply ice pack with barrier between skin and ice pack. Patient denies any swelling just tenderness. Advised patient to expect soreness over next few days/weeks however should improve over time. Pt voiced understanding and appreciative of call back.

## 2024-02-01 ENCOUNTER — Other Ambulatory Visit: Payer: Self-pay

## 2024-02-01 ENCOUNTER — Emergency Department (HOSPITAL_COMMUNITY)

## 2024-02-01 ENCOUNTER — Telehealth: Payer: Self-pay

## 2024-02-01 ENCOUNTER — Emergency Department (HOSPITAL_COMMUNITY): Admission: EM | Admit: 2024-02-01 | Discharge: 2024-02-01 | Disposition: A | Discharge status: Home / Self Care

## 2024-02-01 DIAGNOSIS — I5042 Chronic combined systolic (congestive) and diastolic (congestive) heart failure: Secondary | ICD-10-CM | POA: Diagnosis not present

## 2024-02-01 DIAGNOSIS — E119 Type 2 diabetes mellitus without complications: Secondary | ICD-10-CM | POA: Insufficient documentation

## 2024-02-01 DIAGNOSIS — Z95810 Presence of automatic (implantable) cardiac defibrillator: Secondary | ICD-10-CM | POA: Diagnosis not present

## 2024-02-01 DIAGNOSIS — R0602 Shortness of breath: Secondary | ICD-10-CM | POA: Diagnosis not present

## 2024-02-01 DIAGNOSIS — I110 Hypertensive heart disease with heart failure: Secondary | ICD-10-CM | POA: Insufficient documentation

## 2024-02-01 DIAGNOSIS — Z8673 Personal history of transient ischemic attack (TIA), and cerebral infarction without residual deficits: Secondary | ICD-10-CM | POA: Diagnosis not present

## 2024-02-01 DIAGNOSIS — G8918 Other acute postprocedural pain: Secondary | ICD-10-CM | POA: Diagnosis not present

## 2024-02-01 DIAGNOSIS — Z79899 Other long term (current) drug therapy: Secondary | ICD-10-CM | POA: Diagnosis not present

## 2024-02-01 DIAGNOSIS — I700 Atherosclerosis of aorta: Secondary | ICD-10-CM | POA: Diagnosis not present

## 2024-02-01 DIAGNOSIS — Z7982 Long term (current) use of aspirin: Secondary | ICD-10-CM | POA: Diagnosis not present

## 2024-02-01 DIAGNOSIS — Z720 Tobacco use: Secondary | ICD-10-CM | POA: Diagnosis not present

## 2024-02-01 DIAGNOSIS — J441 Chronic obstructive pulmonary disease with (acute) exacerbation: Secondary | ICD-10-CM | POA: Insufficient documentation

## 2024-02-01 DIAGNOSIS — R918 Other nonspecific abnormal finding of lung field: Secondary | ICD-10-CM | POA: Diagnosis not present

## 2024-02-01 DIAGNOSIS — R0789 Other chest pain: Secondary | ICD-10-CM | POA: Diagnosis not present

## 2024-02-01 DIAGNOSIS — R079 Chest pain, unspecified: Secondary | ICD-10-CM | POA: Diagnosis not present

## 2024-02-01 LAB — TROPONIN I (HIGH SENSITIVITY)
Troponin I (High Sensitivity): 7 ng/L (ref <18)
Troponin I (High Sensitivity): 7 ng/L (ref <18)

## 2024-02-01 LAB — CBC
HCT: 45.4 % (ref 36.0–46.0)
Hemoglobin: 14.8 g/dL (ref 12.0–15.0)
MCH: 29 pg (ref 26.0–34.0)
MCHC: 32.6 g/dL (ref 30.0–36.0)
MCV: 89 fL (ref 80.0–100.0)
Platelets: 196 K/uL (ref 150–400)
RBC: 5.1 MIL/uL (ref 3.87–5.11)
RDW: 13.2 % (ref 11.5–15.5)
WBC: 7.5 K/uL (ref 4.0–10.5)
nRBC: 0 % (ref 0.0–0.2)

## 2024-02-01 LAB — BASIC METABOLIC PANEL WITH GFR
Anion gap: 12 (ref 5–15)
BUN: 8 mg/dL (ref 6–20)
CO2: 21 mmol/L — ABNORMAL LOW (ref 22–32)
Calcium: 9 mg/dL (ref 8.9–10.3)
Chloride: 105 mmol/L (ref 98–111)
Creatinine, Ser: 0.72 mg/dL (ref 0.44–1.00)
GFR, Estimated: >60 mL/min (ref >60)
Glucose, Bld: 142 mg/dL — ABNORMAL HIGH (ref 70–99)
Potassium: 3.7 mmol/L (ref 3.5–5.1)
Sodium: 138 mmol/L (ref 135–145)

## 2024-02-01 LAB — BRAIN NATRIURETIC PEPTIDE: B Natriuretic Peptide: 256.5 pg/mL — ABNORMAL HIGH (ref 0.0–100.0)

## 2024-02-01 LAB — I-STAT CHEM 8, ED
BUN: 7 mg/dL (ref 6–20)
Calcium, Ion: 1.15 mmol/L (ref 1.15–1.40)
Chloride: 106 mmol/L (ref 98–111)
Creatinine, Ser: 0.7 mg/dL (ref 0.44–1.00)
Glucose, Bld: 143 mg/dL — ABNORMAL HIGH (ref 70–99)
HCT: 46 % (ref 36.0–46.0)
Hemoglobin: 15.6 g/dL — ABNORMAL HIGH (ref 12.0–15.0)
Potassium: 3.6 mmol/L (ref 3.5–5.1)
Sodium: 140 mmol/L (ref 135–145)
TCO2: 19 mmol/L — ABNORMAL LOW (ref 22–32)

## 2024-02-01 LAB — PROTIME-INR
INR: 0.9 (ref 0.8–1.2)
Prothrombin Time: 13 s (ref 11.4–15.2)

## 2024-02-01 MED ORDER — METHYLPREDNISOLONE SODIUM SUCC 125 MG IJ SOLR
125.0000 mg | Freq: Once | INTRAMUSCULAR | Status: AC
  Administered 2024-02-01: 125 mg via INTRAVENOUS
  Filled 2024-02-01: qty 2

## 2024-02-01 MED ORDER — IOHEXOL 350 MG/ML SOLN
57.0000 mL | Freq: Once | INTRAVENOUS | Status: AC | PRN
  Administered 2024-02-01: 57 mL via INTRAVENOUS

## 2024-02-01 MED ORDER — METHYLPREDNISOLONE 4 MG PO TBPK
ORAL_TABLET | ORAL | 0 refills | Status: DC
  Filled 2024-02-01: qty 21, 6d supply, fill #0

## 2024-02-01 MED ORDER — OXYCODONE HCL 5 MG PO TABS
5.0000 mg | ORAL_TABLET | Freq: Four times a day (QID) | ORAL | 0 refills | Status: AC | PRN
  Filled 2024-02-01: qty 8, 2d supply, fill #0

## 2024-02-01 MED ORDER — IPRATROPIUM-ALBUTEROL 0.5-2.5 (3) MG/3ML IN SOLN
3.0000 mL | RESPIRATORY_TRACT | Status: AC
  Administered 2024-02-01: 3 mL via RESPIRATORY_TRACT
  Filled 2024-02-01: qty 3

## 2024-02-01 MED ORDER — IPRATROPIUM-ALBUTEROL 0.5-2.5 (3) MG/3ML IN SOLN: 3.0000 mL | Freq: Once | RESPIRATORY_TRACT | Status: DC

## 2024-02-01 MED ORDER — MORPHINE SULFATE (PF) 4 MG/ML IV SOLN
4.0000 mg | Freq: Once | INTRAVENOUS | Status: AC
  Administered 2024-02-01: 4 mg via INTRAVENOUS
  Filled 2024-02-01: qty 1

## 2024-02-01 NOTE — Telephone Encounter (Signed)
 Follow-up after same day discharge: Implant date: 01/30/2024 MD: Kennyth  Device: ICD  Location: L chest    Wound check visit: 02/09/2024 90 day MD follow-up: 04/30/2024  Remote Transmission received:yes  Dressing/sling removed: n/a  Confirm OAC restart on: yes  Please continue to monitor your cardiac device site for redness, swelling, and drainage. Call the device clinic at [phone] if you experience these symptoms, fever/chills, or have questions about your device.   Remote monitoring is used to monitor your cardiac device from home. This monitoring is scheduled every 91 days by our office. It allows us  to keep an eye on the functioning of your device to ensure it is working properly.

## 2024-02-01 NOTE — Discharge Instructions (Signed)
 As we discussed, your workup in the ER today was reassuring for acute findings.  Laboratory evaluation, x-ray, and CT imaging did not reveal any emergent cause of your symptoms.  I suspect that you are experiencing postoperative pain from your procedure you had 2 days ago.  This is to be expected.  Cardiology saw you and did not feel that any other intervention was indicated today.  I have given you a prescription for oxycodone  which is a narcotic pain medicine you can take as prescribed as needed for severe breakthrough pain only.  Do not drive or operate machinery while taking this medication as it can be sedating.  Please continue to take the extra strength Tylenol  that you have been taking as well.  You can take 1000 mg of this every 6 hours up to 4 times a day with a maximum daily dosage of 4000 mg.  Cardiology also recommended that you take a short course of ibuprofen . They recommend you take 400 mg twice a day for a maximum of 3 days.   Please follow-up closely with your cardiology team outpatient.  I have also given you a prescription for short course of steroids.  Please take this starting tomorrow.  Return if development of any new or worsening symptoms.

## 2024-02-01 NOTE — ED Provider Notes (Signed)
 [place] [facility] AT [facility] Provider Note   CSN: [id number] Arrival date & time: 02/01/24  9358     Patient presents with: Chest Pain and Shortness of Breath   Chelsea Branch is a 58 y.o. female.   Patient with history of CHF, hypertension, NSTEMI, T2DM, COPD, stroke presents today with complaints of post op pain. Reports that 2 days ago she had a defibrillator placed in her left upper chest. Since then she has had significant pain over the area.  She has been taking the tramadol  she was at home with with minimal improvement.  Also reports some shortness of breath and wheezing since the procedure as well. Denies fevers. Reached out to her cardiology team who recommended she come here for evaluation. Denies leg pain or leg swelling.   The history is provided by the patient. No language interpreter was used.  Chest Pain Associated symptoms: shortness of breath   Shortness of Breath Associated symptoms: chest pain        Prior to Admission medications   Medication Sig Start Date End Date Taking? Authorizing Provider  acetaminophen  (TYLENOL ) 500 MG tablet Take 1,000 mg by mouth as needed for mild pain (pain score 1-3) or moderate pain (pain score 4-6).    [provider]  albuterol  (VENTOLIN  HFA) 108 (90 Base) MCG/ACT inhaler Inhale 2 puffs into the lungs every 4 (four) hours as needed for wheezing or shortness of breath. 01/25/24   Newlin, Enobong, MD  aspirin  81 MG chewable tablet Chew 1 tablet (81 mg total) by mouth daily. 04/14/23   Lenard Calin, MD  atorvastatin  (LIPITOR) 40 MG tablet Take 1 tablet (40 mg total) by mouth daily. 11/17/23   Newlin, Enobong, MD  FLUoxetine  (PROZAC ) 20 MG capsule Take 1 capsule (20 mg total) by mouth daily. 11/03/23   Newlin, Enobong, MD  fluticasone  (FLONASE ) 50 MCG/ACT nasal spray Place 2 sprays into both nostrils daily. Patient taking differently: Place 2 sprays into both nostrils daily as needed for allergies.  04/10/23   Rising, Asberry, PA-C  hydrOXYzine  (ATARAX ) 25 MG tablet Take 1 tablet (25 mg total) by mouth at bedtime as needed. Patient not taking: Reported on 01/27/2024 11/03/23   Newlin, Enobong, MD  ketoconazole  (NIZORAL ) 2 % shampoo Apply 1 Application topically 2 (two) times a week. 08/18/23   Fleming, Zelda W, NP  methimazole  (TAPAZOLE ) 5 MG tablet Take 1 tablet (5 mg total) by mouth 2 (two) times daily. 09/14/23   Newlin, Enobong, MD  metoprolol  succinate (TOPROL -XL) 50 MG 24 hr tablet Take 1.5 tablets (75 mg total) by mouth daily. Take with or immediately following a meal. 08/26/23   Thukkani, Arun K, MD  Misc. Devices MISC Blood Pressure Monitor 10/20/23   Delbert Clam, MD  polyethylene glycol powder (GLYCOLAX /MIRALAX ) 17 GM/SCOOP powder Take 17 g by mouth daily. 12/29/23   Newlin, Enobong, MD  polyethylene glycol powder (GLYCOLAX /MIRALAX ) 17 GM/SCOOP powder Dissolve 17 grams into water and drink by mouth every day. Patient not taking: Reported on 01/27/2024 12/29/23     potassium chloride  SA (KLOR-CON  M20) 20 MEQ tablet Take 3 tablets (60 mEq total) by mouth daily. 01/30/24   Kennyth Chew, MD  sacubitril -valsartan  (ENTRESTO ) 49-51 MG Take 1 tablet by mouth 2 (two) times daily. 08/26/23   Thukkani, Arun K, MD  Semaglutide , 1 MG/DOSE, 4 MG/3ML SOPN Inject 1 mg as directed once a week. 01/04/24   Newlin, Enobong, MD  Semaglutide ,0.25 or 0.5MG /DOS, (OZEMPIC , 0.25 OR 0.5 MG/DOSE,) 2 MG/3ML  SOPN Inject 0.25 mg into the skin once a week. For 4 weeks then increase to 0.5mg  Patient taking differently: Inject 0.25 mg into the skin every Wednesday. For 4 weeks then increase to 0.5mg  01/04/24   Newlin, Enobong, MD  Semaglutide ,0.25 or 0.5MG /DOS, (OZEMPIC , 0.25 OR 0.5 MG/DOSE,) 2 MG/3ML SOPN Inject 0.5 mg into the skin once a week. For 4 weeks then increase to 1mg  01/04/24   Newlin, Enobong, MD  spironolactone  (ALDACTONE ) 25 MG tablet Take 1 tablet (25 mg total) by mouth daily. 08/26/23   Thukkani, Arun K, MD  torsemide   (DEMADEX ) 20 MG tablet Take 1 tablet (20 mg total) by mouth daily. 04/28/23   Newlin, Enobong, MD  triamcinolone  cream (KENALOG ) 0.1 % Apply 1 Application topically 2 (two) times daily. Patient taking differently: Apply 1 Application topically 2 (two) times daily as needed (skin irritation.). 01/04/24   Newlin, Enobong, MD    Allergies: Ace inhibitors and Jardiance  [empagliflozin ]    Review of Systems  Respiratory:  Positive for shortness of breath.   Cardiovascular:  Positive for chest pain.  All other systems reviewed and are negative.   Updated Vital Signs BP (!) 143/83 (BP Location: Right Arm)   Pulse 99   Temp 98.5 F (36.9 C)   Resp (!) 22   SpO2 100%   Physical Exam Vitals and nursing note reviewed.  Constitutional:      General: She is not in acute distress.    Appearance: Normal appearance. She is normal weight. She is not ill-appearing, toxic-appearing or diaphoretic.  HENT:     Head: Normocephalic and atraumatic.  Cardiovascular:     Rate and Rhythm: Normal rate and regular rhythm.     Heart sounds: Normal heart sounds.  Pulmonary:     Effort: Pulmonary effort is normal. No respiratory distress.     Comments: Trace expiratory wheezing in bilateral lung bases Chest:     Comments: Surgical site left upper chest is tender to palpation with some trace swelling without warmth, fluctuance, induration, or drainage.  Abdominal:     Palpations: Abdomen is soft.     Tenderness: There is no abdominal tenderness.  Musculoskeletal:        General: Normal range of motion.     Cervical back: Normal range of motion.     Right lower leg: No tenderness. No edema.     Left lower leg: No tenderness. No edema.  Skin:    General: Skin is warm and dry.  Neurological:     General: No focal deficit present.     Mental Status: She is alert.  Psychiatric:        Mood and Affect: Mood normal.        Behavior: Behavior normal.     (all labs ordered are listed, but only abnormal  results are displayed) Labs Reviewed  BASIC METABOLIC PANEL WITH GFR - Abnormal; Notable for the following components:      Result Value   CO2 21 (*)    Glucose, Bld 142 (*)    All other components within normal limits  BRAIN NATRIURETIC PEPTIDE - Abnormal; Notable for the following components:   B Natriuretic Peptide 256.5 (*)    All other components within normal limits  I-STAT CHEM 8, ED - Abnormal; Notable for the following components:   Glucose, Bld 143 (*)    TCO2 19 (*)    Hemoglobin 15.6 (*)    All other components within normal limits  CBC  PROTIME-INR  TROPONIN I (HIGH SENSITIVITY)  TROPONIN I (HIGH SENSITIVITY)    EKG: EKG Interpretation Date/Time:  Wednesday February 01 2024 06:53:49 EDT Ventricular Rate:  97 PR Interval:  162 QRS Duration:  96 QT Interval:  358 QTC Calculation: 454 R Axis:   9  Text Interpretation: Normal sinus rhythm Minimal voltage criteria for LVH, may be normal variant ( Cornell product ) ST & T wave abnormality, consider inferior ischemia Abnormal ECG When compared with ECG of 30-Jan-2024 16:59, ST-t abnormality is more prominent Confirmed by Raford Lenis ([id number]) on 02/01/2024 6:57:43 AM  Radiology: CT Angio Chest PE W and/or Wo Contrast Result Date: 02/01/2024 CLINICAL DATA:  Left-sided chest pain and shortness of breath. EXAM: CT ANGIOGRAPHY CHEST WITH CONTRAST TECHNIQUE: Multidetector CT imaging of the chest was performed using the standard protocol during bolus administration of intravenous contrast. Multiplanar CT image reconstructions and MIPs were obtained to evaluate the vascular anatomy. RADIATION DOSE REDUCTION: This exam was performed according to the departmental dose-optimization program which includes automated exposure control, adjustment of the mA and/or kV according to patient size and/or use of iterative reconstruction technique. CONTRAST:  57mL OMNIPAQUE  IOHEXOL  350 MG/ML SOLN COMPARISON:  None Available. FINDINGS:  Cardiovascular: A single lead ventricular pacer is noted. Satisfactory opacification of the pulmonary arteries to the segmental level. No evidence of pulmonary embolism. Normal heart size with mild coronary artery calcification. No pericardial effusion. Mediastinum/Nodes: No enlarged mediastinal, hilar, or axillary lymph nodes. Thyroid  gland, trachea, and esophagus demonstrate no significant findings. Lungs/Pleura: Mild anterolateral right lower lobe linear scarring and/or atelectasis is seen. No acute infiltrate, pleural effusion or pneumothorax is identified. Upper Abdomen: A 2.2 cm low-attenuation (approximately 8.64 Hounsfield units) right adrenal mass is seen. 1.7 cm, 1.3 cm and 1.9 cm low-attenuation left adrenal masses are also present (approximately -0.77 Hounsfield units). Musculoskeletal: No chest wall abnormality. No acute or significant osseous findings. Review of the MIP images confirms the above findings. IMPRESSION: 1. No evidence of pulmonary embolism or other acute intrathoracic process. 2. 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 3. Mild anterolateral right lower lobe linear scarring and/or atelectasis. Electronically Signed   By: Suzen Dials M.D.   On: 02/01/2024 10:47   DG Chest 2 View Result Date: 02/01/2024 CLINICAL DATA:  58 year old female with shortness of breath and chest pain. EXAM: CHEST - 2 VIEW COMPARISON:  01/30/2024 chest radiographs and earlier. FINDINGS: Stable left chest AICD. Lung volumes and mediastinal contours are stable, within normal limits. No pneumothorax, pulmonary edema, pleural effusion or confluent lung opacity. Visualized tracheal air column is within normal limits. No acute osseous abnormality identified. Abdominal Calcified aortic atherosclerosis. Negative visible bowel gas. IMPRESSION: No acute cardiopulmonary abnormality. Electronically Signed   By: VEAR Hurst M.D.   On: 02/01/2024 07:17   EP PPM/ICD  IMPLANT Addendum Date: 01/31/2024  CONCLUSIONS:  1. Successful ICD implantation with a VVI ICD implanted for primary prevention of sudden death.  2. No early apparent complications. [name], MD, [facility], [facility] Cardiac Electrophysiology  Result Date: 01/31/2024  CONCLUSIONS:  1. Successful ICD implantation with a VVI ICD implanted for primary prevention of sudden death.  2. No early apparent complications. [name], MD, [facility], [facility] Cardiac Electrophysiology   DG Chest 2 View Result Date: 01/30/2024 EXAM: 2 VIEW(S) XRAY OF THE CHEST 01/30/2024 05:35:28 PM COMPARISON: 11/28/2023 CLINICAL HISTORY: Cardiac device in situ, other FINDINGS: LUNGS AND PLEURA: No focal pulmonary opacity. No pulmonary edema. No pleural effusion. No pneumothorax. HEART AND MEDIASTINUM: Left chest  wall single lead pacer/AICD with lead overlying the right ventricle. BONES AND SOFT TISSUES: Degenerative changes in the thoracic spine. Multilevel compression deformities in the thoracic spine. IMPRESSION: 1. Left chest wall single lead pacer/AICD with lead overlying the right ventricle. 2. Multilevel compression deformities in the thoracic spine. Electronically signed by: Donnice Mania MD 01/30/2024 05:43 PM EDT RP Workstation: [id number]     Procedures   Medications Ordered in the ED  ipratropium-albuterol  (DUONEB) 0.5-2.5 (3) MG/3ML nebulizer solution 3 mL (3 mLs Nebulization Given 02/01/24 0923)  morphine  (PF) 4 MG/ML injection 4 mg (4 mg Intravenous Given 02/01/24 0935)  methylPREDNISolone  sodium succinate (SOLU-MEDROL ) 125 mg/2 mL injection 125 mg (125 mg Intravenous Given 02/01/24 0932)                                    Medical Decision Making Amount and/or Complexity of Data Reviewed Labs: ordered. Radiology: ordered.  Risk Prescription drug management.   This patient is a 58 y.o. female who presents to the ED for concern of chest pain, shortness of breath, this involves an extensive number of treatment options,  and is a complaint that carries with it a high risk of complications and morbidity. The emergent differential diagnosis prior to evaluation includes, but is not limited to,  CHF, pericardial effusion/tamponade, arrhythmias, ACS, COPD, asthma, bronchitis, pneumonia, pneumothorax, PE, anemia, pericarditis, myocarditis, aortic dissection, esophageal rupture, pneumonia, reflux/PUD, biliary disease, pancreatitis, costochondritis, anxiety  This is not an exhaustive differential.   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  Additional history: Chart reviewed. Pertinent results include: patient had defibrillator placed with Dr. Kennyth on 01/30/2024  Physical Exam: Physical exam performed. The pertinent findings include:   Trace expiratory wheezing in bilateral lung bases  Surgical site left upper chest is tender to palpation with some trace swelling without warmth, fluctuance, induration, or drainage.   Lab Tests: I ordered, and personally interpreted labs.  The pertinent results include:  BNP 256, troponin WNL. No leukocytosis or anemia   Imaging Studies: I ordered  imaging studies including CXR, CTA PE. I independently visualized and interpreted imaging which showed   CXR: NAD  CTA:   1. No evidence of pulmonary embolism or other acute intrathoracic process. 2. 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 3. Mild anterolateral right lower lobe linear scarring and/or atelectasis.  I agree with the radiologist interpretation.   Cardiac Monitoring:  The patient was maintained on a cardiac monitor.  My attending physician viewed and interpreted the cardiac monitored which showed an underlying rhythm of: sinus rhythm, no STEMI. I agree with this interpretation.   Medications: I ordered medication including morphine , solu-medrol , duonebs  for chest pain, wheezing. Reevaluation of the patient after these medicines showed that the patient improved. I have reviewed the patients home medicines and have made adjustments as needed.  Consultations Obtained: I requested consultation with the cardiology on call Suzann Riddle, NP with EP,  and discussed lab and imaging findings as well as pertinent plan - they recommend: they have seen the patient, reassurance given. Recommends d/c with close outpatient follow-up, can add los dose NSAID 400 mg BID x 3 days as well   Disposition: After consideration of the diagnostic results and the patients response to treatment, I feel that emergency department workup does not suggest an emergent condition requiring admission or immediate intervention beyond what has been performed at this time. The plan is: discharge with close outpatient follow-up and return precautions per cardiology who has seen the patient. Will recommend short course of NSAIDs per cardiology recommendations. Will also give a few doses of oxycodone  for severe breakthrough pain. PDMP reviewed, patient advised not to drive or operate heavy machinery while taking this medication. She did also have some expiratory wheezing and  dyspnea which improved with nebulizer treatment and steroids.  Therefore will prescribe short course of steroids as well.  Evaluation and diagnostic testing in the emergency department does not suggest an emergent condition requiring admission or immediate intervention beyond what has been performed at this time.  Plan for discharge with close PCP follow-up.  Patient is understanding and amenable with plan, educated on red flag symptoms that would prompt immediate return.  Patient discharged in stable condition.   This is a shared visit with supervising physician Dr. Simon who has independently evaluated patient & provided guidance in evaluation/management/disposition, in agreement with care   Final diagnoses:  COPD exacerbation (HCC)  Post-op pain    ED Discharge Orders          Ordered    methylPREDNISolone  (MEDROL  DOSEPAK) 4 MG TBPK tablet        02/01/24 1158    oxyCODONE  (ROXICODONE ) 5 MG immediate release tablet  Every 6 hours PRN        02/01/24 1158          An After Visit Summary was printed and given to the patient.      Pj Zehner A, PA-C 02/01/24 1201    Simon Lavonia SAILOR, MD 02/01/24 [phone]

## 2024-02-01 NOTE — ED Notes (Signed)
 Patient transported to CT

## 2024-02-01 NOTE — ED Triage Notes (Signed)
 Pt bib POV c/o left side CP and sob since Monday after the defibrillator placed. Pt states it feels like it is shooting.    Hx CHF HTN

## 2024-02-01 NOTE — ED Notes (Signed)
 NT assisted pt to restroom with wheelchair

## 2024-02-02 ENCOUNTER — Telehealth: Payer: Self-pay | Admitting: Family Medicine

## 2024-02-02 NOTE — Telephone Encounter (Signed)
Pt has been scheduled and informed

## 2024-02-02 NOTE — Telephone Encounter (Signed)
 Copied from CRM [id number]. Topic: Clinical - Order For Equipment >> Feb 02, 2024  9:06 AM [place] MATSU wrote: Reason for CRM: patient called in requesting to know if she can get a breathing treatment machine at home. Per patient she went to ER yesterday and had one there but she would like to know if one can be ordered for home.   May you please assist.

## 2024-02-05 ENCOUNTER — Encounter (HOSPITAL_COMMUNITY): Payer: Self-pay | Admitting: *Deleted

## 2024-02-05 ENCOUNTER — Other Ambulatory Visit: Payer: Self-pay

## 2024-02-05 ENCOUNTER — Ambulatory Visit (INDEPENDENT_AMBULATORY_CARE_PROVIDER_SITE_OTHER)

## 2024-02-05 ENCOUNTER — Ambulatory Visit (HOSPITAL_COMMUNITY)
Admission: EM | Admit: 2024-02-05 | Discharge: 2024-02-05 | Disposition: A | Discharge status: Home / Self Care | Attending: Emergency Medicine | Admitting: Emergency Medicine

## 2024-02-05 DIAGNOSIS — R0602 Shortness of breath: Secondary | ICD-10-CM

## 2024-02-05 DIAGNOSIS — Z95810 Presence of automatic (implantable) cardiac defibrillator: Secondary | ICD-10-CM | POA: Diagnosis not present

## 2024-02-05 MED ORDER — DEXAMETHASONE SODIUM PHOSPHATE 10 MG/ML IJ SOLN
10.0000 mg | Freq: Once | INTRAMUSCULAR | Status: AC
Start: 2024-02-05 — End: 2024-02-05
  Administered 2024-02-05: 10 mg via INTRAMUSCULAR

## 2024-02-05 MED ORDER — ALBUTEROL SULFATE (2.5 MG/3ML) 0.083% IN NEBU: 2.5000 mg | INHALATION_SOLUTION | Freq: Four times a day (QID) | RESPIRATORY_TRACT | 12 refills | Status: DC | PRN

## 2024-02-05 MED ORDER — ALBUTEROL SULFATE HFA 108 (90 BASE) MCG/ACT IN AERS
INHALATION_SPRAY | RESPIRATORY_TRACT | Status: AC
  Filled 2024-02-05: qty 6.7

## 2024-02-05 MED ORDER — ALBUTEROL SULFATE HFA 108 (90 BASE) MCG/ACT IN AERS
2.0000 | INHALATION_SPRAY | Freq: Once | RESPIRATORY_TRACT | Status: AC
  Administered 2024-02-05: 2 via RESPIRATORY_TRACT

## 2024-02-05 MED ORDER — IPRATROPIUM-ALBUTEROL 0.5-2.5 (3) MG/3ML IN SOLN
3.0000 mL | Freq: Once | RESPIRATORY_TRACT | Status: AC
  Administered 2024-02-05: 3 mL via RESPIRATORY_TRACT

## 2024-02-05 MED ORDER — IPRATROPIUM-ALBUTEROL 0.5-2.5 (3) MG/3ML IN SOLN
RESPIRATORY_TRACT | Status: AC
  Filled 2024-02-05: qty 3

## 2024-02-05 MED ORDER — AMOXICILLIN-POT CLAVULANATE 875-125 MG PO TABS
1.0000 | ORAL_TABLET | Freq: Two times a day (BID) | ORAL | 0 refills | Status: DC
Start: 2024-02-05 — End: 2024-04-16

## 2024-02-05 MED ORDER — HYDROCODONE BIT-HOMATROP MBR 5-1.5 MG/5ML PO SOLN: 5.0000 mL | Freq: Four times a day (QID) | ORAL | 0 refills | Status: AC | PRN

## 2024-02-05 MED ORDER — DEXAMETHASONE SODIUM PHOSPHATE 10 MG/ML IJ SOLN
INTRAMUSCULAR | Status: AC
  Filled 2024-02-05: qty 1

## 2024-02-05 NOTE — ED Triage Notes (Signed)
 PT reports SHOB since Defib placed Monday 01-30-24. Pt had placement of Defib last Monday. Pt is requesting refill on Inhaler and cough Med with Codeine  . Pt has not been sleeping due to cough and SHOB.

## 2024-02-05 NOTE — ED Provider Notes (Signed)
 [facility]    CSN: [id number] Arrival date & time: 02/05/24  1218      History   Chief Complaint Chief Complaint  Patient presents with   Shortness of Breath    HPI Chelsea Branch is a 58 y.o. female.     Patient with heart failure, ICD placed on 01/30/24.  Was seen 2 days later in the emergency department for postop pain and COPD exacerbation.  At this visit her BNP was elevated at 256.5, troponin was negative, chest x-ray did not show any abnormality, and CT angio did not show acute abnormalities.  Was discharged home.    Presents to clinic 4 days after ED visit.  She continues to have coughing, reports she has not been able to sleep since her last visit and the cough is keeping her up at night.  Has been taking the methylprednisone Dosepak, reports this is not effective.   Has not had fever.  Continues to have wheezing and shortness of breath with coughing.  Reports her bronchitis is flaring up.  Has not had chest pain.  Reports some swelling around the ICD site.   The history is provided by the patient and medical records.  Shortness of Breath   Past Medical History:  Diagnosis Date   Abnormal thyroid  blood test 03/15/2017   Abnormal uterine bleeding    Alopecia    Anemia    Angina pectoris with normal coronary arteriogram (HCC) 2017   Had + Troponin c/w ? 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 iniatially ?2/2 peripartum in 1994 - improved by 2008 then worsening EF in 2011 back down to EF 25-30%. b. echo 01/21/14 showed mod LVH, [name]; 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) 04/28/2023   History of cerebellar stroke 04/13/2023   History of stroke 04/11/2023   Liletta  IUD (intrauterine device) in place since 12/15/2015 04/06/2023   Chronic pain syndrome 12/28/2021   Primary osteoarthritis of both knees 12/28/2021   Spinal stenosis of lumbar region 07/21/2021   Lumbar radiculitis 07/21/2021   COPD (chronic obstructive pulmonary disease) (HCC) 07/21/2021   Mechanical complication due to intrauterine contraceptive device 07/21/2021   Arthropathy of lumbar facet joint 03/14/2019   Degenerative disc disease at L5-S1 level 10/10/2018   Hyperthyroidism 04/26/2016   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   History of non-ST elevation myocardial infarction (NSTEMI) 06/14/2015   Homelessness 09/16/2014   Vitamin D   deficiency 01/29/2014   OSA (obstructive sleep apnea) 01/08/2014   Chronic combined systolic and diastolic CHF (congestive heart failure) (HCC) 08/14/2013   Essential hypertension    Fibroids 08/09/2012   Menorrhagia 05/08/2012   Female pattern hair loss    HLD (hyperlipidemia) 06/08/2010   CEREBRAL ANEURYSM 06/08/2010   History of cardiovascular disorder 06/08/2010    Past Surgical History:  Procedure Laterality Date   CARDIAC CATHETERIZATION N/A 06/16/2015   Procedure: Left Heart Cath and Coronary Angiography;  Surgeon: Candyce GORMAN Reek, MD;  Location: [facility] INVASIVE CV LAB;  Service: Cardiovascular;  Laterality: N/A;   CESAREAN SECTION  1992  1994   ICD IMPLANT N/A 01/30/2024   Procedure: ICD IMPLANT;  Surgeon: Kennyth Chew, MD;  Location: [facility] INVASIVE CV LAB;  Service: Cardiovascular;  Laterality: N/A;   INTRAUTERINE DEVICE (IUD) INSERTION     about 3 years ago   LOOP RECORDER IMPLANT  ~ 2000   spine injections      [facility],  Dr Darlis   TIBIAL TUBERCLERPLASTY     TRANSESOPHAGEAL ECHOCARDIOGRAM (CATH LAB) N/A 04/20/2023   Procedure: TRANSESOPHAGEAL ECHOCARDIOGRAM;  Surgeon: Alvan Ronal BRAVO, MD;  Location: [facility] INVASIVE CV LAB;  Service: Cardiovascular;  Laterality: N/A;   TUBAL LIGATION  05/24/1992    OB History     Gravida  2   Para  2   Term  2   Preterm      AB      Living  3      SAB      IAB      Ectopic      Multiple  1   Live Births  3            Home Medications    Prior to Admission medications   Medication Sig Start Date End Date Taking? Authorizing Provider  acetaminophen  (TYLENOL ) 500 MG tablet Take 1,000 mg by mouth as needed for mild pain (pain score 1-3) or moderate pain (pain score 4-6).   Yes [provider]  albuterol  (PROVENTIL ) (2.5 MG/3ML) 0.083% nebulizer solution Take 3 mLs (2.5 mg total) by nebulization every 6 (six) hours as needed for wheezing or shortness of breath. 02/05/24  Yes Zoe Nordin  N, FNP  amoxicillin -clavulanate (AUGMENTIN ) 875-125 MG tablet Take 1 tablet by mouth every 12 (twelve) hours. 02/05/24  Yes Zyrion Coey  N, FNP  aspirin  81 MG chewable tablet Chew 1 tablet (81 mg total) by mouth daily. 04/14/23  Yes Lenard Calin, MD  atorvastatin  (LIPITOR) 40 MG tablet Take 1 tablet (40 mg total) by mouth daily. 11/17/23  Yes Newlin, Enobong, MD  FLUoxetine  (PROZAC ) 20 MG capsule Take 1 capsule (20 mg total) by mouth daily. 11/03/23  Yes Newlin, Enobong, MD  fluticasone  (FLONASE ) 50 MCG/ACT nasal spray Place 2 sprays into  both nostrils daily. Patient taking differently: Place 2 sprays into both nostrils daily as needed for allergies. 04/10/23  Yes Rising, Asberry, PA-C  HYDROcodone  bit-homatropine (HYCODAN) 5-1.5 MG/5ML syrup Take 5 mLs by mouth every 6 (six) hours as needed for cough. 02/05/24  Yes Ramsey Midgett  N, FNP  hydrOXYzine  (ATARAX ) 25 MG tablet Take 1 tablet (25 mg total) by mouth at bedtime as needed. 11/03/23  Yes Newlin, Enobong, MD  methimazole  (TAPAZOLE ) 5 MG tablet Take 1 tablet (5 mg total) by mouth 2 (two) times daily. 09/14/23  Yes Newlin, Enobong, MD  methylPREDNISolone  (MEDROL  DOSEPAK) 4 MG TBPK tablet Take as directed on package 02/01/24  Yes Smoot, Lauraine LABOR,  PA-C  metoprolol  succinate (TOPROL -XL) 50 MG 24 hr tablet Take 1.5 tablets (75 mg total) by mouth daily. Take with or immediately following a meal. 08/26/23  Yes Thukkani, Arun K, MD  polyethylene glycol powder (GLYCOLAX /MIRALAX ) 17 GM/SCOOP powder Take 17 g by mouth daily. 12/29/23  Yes Newlin, Enobong, MD  potassium chloride  SA (KLOR-CON  M20) 20 MEQ tablet Take 3 tablets (60 mEq total) by mouth daily. 01/30/24  Yes Kennyth Chew, MD  sacubitril -valsartan  (ENTRESTO ) 49-51 MG Take 1 tablet by mouth 2 (two) times daily. 08/26/23  Yes Thukkani, Arun K, MD  Semaglutide , 1 MG/DOSE, 4 MG/3ML SOPN Inject 1 mg as directed once a week. 01/04/24  Yes Newlin, Corrina, MD  Semaglutide ,0.25 or 0.5MG /DOS, (OZEMPIC , 0.25 OR 0.5 MG/DOSE,) 2 MG/3ML SOPN Inject 0.25 mg into the skin once a week. For 4 weeks then increase to 0.5mg  Patient taking differently: Inject 0.25 mg into the skin every Wednesday. For 4 weeks then increase to 0.5mg  01/04/24  Yes Newlin, Enobong, MD  Semaglutide ,0.25 or 0.5MG /DOS, (OZEMPIC , 0.25 OR 0.5 MG/DOSE,) 2 MG/3ML SOPN Inject 0.5 mg into the skin once a week. For 4 weeks then increase to 1mg  01/04/24  Yes Newlin, Enobong, MD  spironolactone  (ALDACTONE ) 25 MG tablet Take 1 tablet (25 mg total) by mouth daily. 08/26/23  Yes Thukkani, Arun K, MD   torsemide  (DEMADEX ) 20 MG tablet Take 1 tablet (20 mg total) by mouth daily. 04/28/23  Yes Newlin, Enobong, MD  triamcinolone  cream (KENALOG ) 0.1 % Apply 1 Application topically 2 (two) times daily. Patient taking differently: Apply 1 Application topically 2 (two) times daily as needed (skin irritation.). 01/04/24  Yes Newlin, Enobong, MD  albuterol  (VENTOLIN  HFA) 108 (90 Base) MCG/ACT inhaler Inhale 2 puffs into the lungs every 4 (four) hours as needed for wheezing or shortness of breath. 01/25/24   Newlin, Enobong, MD  ketoconazole  (NIZORAL ) 2 % shampoo Apply 1 Application topically 2 (two) times a week. 08/18/23   Theotis Haze ORN, NP  Misc. Devices MISC Blood Pressure Monitor 10/20/23   Delbert Corrina, MD  polyethylene glycol powder (GLYCOLAX /MIRALAX ) 17 GM/SCOOP powder Dissolve 17 grams into water and drink by mouth every day. Patient not taking: Reported on 01/27/2024 12/29/23       Family History Family History  Problem Relation Age of Onset   Healthy Mother    Stroke Sister    Hypertension Sister    Cancer Maternal Grandmother        uterine   Other Neg Hx    Heart disease Neg Hx     Social History Social History   Tobacco Use   Smoking status: Former    Current packs/day: 0.00    Average packs/day: 0.1 packs/day for 30.0 years (3.0 ttl pk-yrs)    Types: Cigarettes    Start date: 06/02/1985    Quit date: 06/03/2015    Years since quitting: 8.6   Smokeless tobacco: Never   Tobacco comments:    Pt. stated she stopped smoking a year ago. 09/29/2018  Vaping Use   Vaping status: Never Used  Substance Use Topics   Alcohol use: No    Alcohol/week: 0.0 standard drinks of alcohol   Drug use: No     Allergies   Ace inhibitors and Jardiance  [empagliflozin ]   Review of Systems Review of Systems  Per HPI  Physical Exam Triage Vital Signs ED Triage Vitals  Encounter Vitals Group     BP 02/05/24 1247 117/86     Girls Systolic BP Percentile --  Girls Diastolic BP  Percentile --      Boys Systolic BP Percentile --      Boys Diastolic BP Percentile --      Pulse Rate 02/05/24 1247 89     Resp 02/05/24 1247 20     Temp 02/05/24 1247 98.6 F (37 C)     Temp src --      SpO2 02/05/24 1247 98 %     Weight --      Height --      Head Circumference --      Peak Flow --      Pain Score 02/05/24 1248 9     Pain Loc --      Pain Education --      Exclude from Growth Chart --    No data found.  Updated Vital Signs BP 117/86   Pulse 89   Temp 98.6 F (37 C)   Resp 20   SpO2 98%   Visual Acuity Right Eye Distance:   Left Eye Distance:   Bilateral Distance:    Right Eye Near:   Left Eye Near:    Bilateral Near:     Physical Exam Vitals and nursing note reviewed.  Constitutional:      Appearance: She is well-developed.  HENT:     Head: Normocephalic and atraumatic.     Mouth/Throat:     Mouth: Mucous membranes are moist.  Eyes:     Pupils: Pupils are equal, round, and reactive to light.  Cardiovascular:     Rate and Rhythm: Normal rate and regular rhythm.  Pulmonary:     Breath sounds: Decreased breath sounds, wheezing and rhonchi present.  Musculoskeletal:        General: Normal range of motion.  Skin:    General: Skin is warm and dry.  Neurological:     General: No focal deficit present.     Mental Status: She is alert and oriented to person, place, and time.  Psychiatric:        Mood and Affect: Mood normal.        Behavior: Behavior normal.      UC Treatments / Results  Labs (all labs ordered are listed, but only abnormal results are displayed) Labs Reviewed - No data to display  EKG   Radiology No results found.  Procedures Procedures (including critical care time)  Medications Ordered in UC Medications  albuterol  (VENTOLIN  HFA) 108 (90 Base) MCG/ACT inhaler 2 puff (has no administration in time range)  dexamethasone  (DECADRON ) injection 10 mg (has no administration in time range)  ipratropium-albuterol   (DUONEB) 0.5-2.5 (3) MG/3ML nebulizer solution 3 mL (3 mLs Nebulization Given 02/05/24 1401)    Initial Impression / Assessment and Plan / UC Course  I have reviewed the triage vital signs and the nursing notes.  Pertinent labs & imaging results that were available during my care of the patient were reviewed by me and considered in my medical decision making (see chart for details).  Vitals and triage reviewed, patient appears hemodynamically stable.  Is having trouble with deep inspirations, triggers coughing spells.  Lungs diminished with wheezing and rhonchi throughout all lobes.  Heart with regular rate and rhythm.  ICD site is clean and dry, without concern for cellulitis.  Chest x-ray appears consistent with previous, no acute pneumonia.  Since patient is not having improvement on methylprednisone Dosepak, IM steroid given in clinic.  Subjective improvement after DuoNeb.  Will provide with DME neb kit for  home use.  Cough management discussed.  Will start on Augmentin  due to no improvement with steroids, increased dyspnea and increased sputum with COPD exacerbation.  Strict emergency precautions given if symptoms evolve or worsen in any way.  Plan of care, follow-up care return precautions discussed, no questions at this time.     Final Clinical Impressions(s) / UC Diagnoses   Final diagnoses:  Shortness of breath     Discharge Instructions      Either use the albuterol  inhaler or the nebulizer solution every 6 hours as needed for wheezing or shortness of breath.  Start the antibiotics tonight and take them twice daily with food until finished.  Use the cough syrup as needed, do not drink alcohol or drive on this medication as it is a narcotic and can cause drowsiness and sedation, may also cause constipation.  Do not hesitate to seek immediate care at the nearest emergency department if your symptoms worsen or evolve in any way.  Follow-up with cardiology as scheduled for  evaluation of your swelling around your defibrillator.      ED Prescriptions     Medication Sig Dispense Auth. Provider   amoxicillin -clavulanate (AUGMENTIN ) 875-125 MG tablet Take 1 tablet by mouth every 12 (twelve) hours. 14 tablet Amrit Erck  N, FNP   albuterol  (PROVENTIL ) (2.5 MG/3ML) 0.083% nebulizer solution Take 3 mLs (2.5 mg total) by nebulization every 6 (six) hours as needed for wheezing or shortness of breath. 75 mL Dreama, [place] Vierling  N, FNP   HYDROcodone  bit-homatropine (HYCODAN) 5-1.5 MG/5ML syrup Take 5 mLs by mouth every 6 (six) hours as needed for cough. 120 mL Dreama, Malayia Spizzirri  N, FNP      PDMP not reviewed this encounter.   Dreama, Ha Shannahan  N, FNP 02/05/24 1422

## 2024-02-05 NOTE — Discharge Instructions (Signed)
 Either use the albuterol  inhaler or the nebulizer solution every 6 hours as needed for wheezing or shortness of breath.  Start the antibiotics tonight and take them twice daily with food until finished.  Use the cough syrup as needed, do not drink alcohol or drive on this medication as it is a narcotic and can cause drowsiness and sedation, may also cause constipation.  Do not hesitate to seek immediate care at the nearest emergency department if your symptoms worsen or evolve in any way.  Follow-up with cardiology as scheduled for evaluation of your swelling around your defibrillator.

## 2024-02-06 ENCOUNTER — Encounter: Payer: Self-pay | Admitting: Family Medicine

## 2024-02-06 ENCOUNTER — Ambulatory Visit: Attending: Family Medicine | Admitting: Family Medicine

## 2024-02-06 ENCOUNTER — Ambulatory Visit: Payer: Self-pay

## 2024-02-06 ENCOUNTER — Other Ambulatory Visit: Payer: Self-pay

## 2024-02-06 ENCOUNTER — Other Ambulatory Visit: Payer: Self-pay | Admitting: Family Medicine

## 2024-02-06 DIAGNOSIS — J209 Acute bronchitis, unspecified: Secondary | ICD-10-CM

## 2024-02-06 DIAGNOSIS — J40 Bronchitis, not specified as acute or chronic: Secondary | ICD-10-CM

## 2024-02-06 MED ORDER — ALBUTEROL SULFATE (2.5 MG/3ML) 0.083% IN NEBU: 2.5000 mg | INHALATION_SOLUTION | Freq: Four times a day (QID) | RESPIRATORY_TRACT | 12 refills | Status: DC | PRN

## 2024-02-06 MED ORDER — ALBUTEROL SULFATE (2.5 MG/3ML) 0.083% IN NEBU
2.5000 mg | INHALATION_SOLUTION | Freq: Four times a day (QID) | RESPIRATORY_TRACT | 12 refills | Status: AC | PRN
  Filled 2024-02-06 – 2024-02-07 (×3): qty 75, 7d supply, fill #0

## 2024-02-06 NOTE — Progress Notes (Signed)
 Virtual Visit via Video Note  I connected with Chelsea Branch, on 02/06/2024 at 11:48 AM by video enabled telemedicine device and verified that I am speaking with the correct person using two identifiers.   Consent: I discussed the limitations, risks, security and privacy concerns of performing an evaluation and management service by telemedicine and the availability of in person appointments. I also discussed with the patient that there may be a patient responsible charge related to this service. The patient expressed understanding and agreed to proceed.   Location of Patient: Home  Location of Provider: Clinic   Persons participating in Telemedicine visit: [name] Dr. Delbert    Discussed the use of AI scribe software for clinical note transcription with the patient, who gave verbal consent to proceed.  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  She experienced difficulty breathing, wheezing, and coughing, which led her to presenting to urgent care.  At urgent care, she was prescribed Augmentin , a nebulizer solution, and an inhaler as well as Hycodan cough syrup.  She has received the antibiotics and nebulizer but obtain only a limited amount of cough syrup due to financial constraints. A chest x-ray showed no pneumonia. She is unemployed and lacks transportation, impacting her access to pharmacies.       Past Medical History:  Diagnosis Date   Abnormal thyroid  blood test 03/15/2017   Abnormal uterine bleeding    Alopecia    Anemia    Angina pectoris with normal coronary arteriogram (HCC)  2017   Had + Troponin c/w ? 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 iniatially ?2/2 peripartum in 1994 - improved by 2008 then worsening EF in 2011 back down to EF 25-30%. b. echo 01/21/14 showed mod LVH, [name]; 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   Peripartum cardiomyopathy 1994   Sleep apnea 2015   CPAP 12/2013   Stroke (HCC) 2011   Systolic and diastolic CHF, acute on chronic (HCC) 06/14/2015   Tobacco abuse    Type 2 diabetes mellitus (HCC) 04/28/2023   Allergies  Allergen Reactions   Ace Inhibitors Cough   Jardiance  [Empagliflozin ] Rash    Yeast infection/rash/UTI    Current Outpatient Medications on File Prior to Visit  Medication Sig Dispense Refill   acetaminophen  (TYLENOL ) 500 MG tablet Take 1,000 mg by mouth as needed for mild pain (pain score 1-3) or moderate pain (pain score 4-6).     albuterol  (VENTOLIN  HFA) 108 (90 Base) MCG/ACT inhaler Inhale 2 puffs into the lungs every 4 (four) hours as needed for wheezing or shortness of breath. 18  g 0   amoxicillin -clavulanate (AUGMENTIN ) 875-125 MG tablet Take 1 tablet by mouth every 12 (twelve) hours. 14 tablet 0   aspirin  81 MG chewable tablet Chew 1 tablet (81 mg total) by mouth daily. 30 tablet 1   atorvastatin  (LIPITOR) 40 MG tablet Take 1 tablet (40 mg total) by mouth daily. 90 tablet 1   FLUoxetine  (PROZAC ) 20 MG capsule Take 1 capsule (20 mg total) by mouth daily. 90 capsule 1   fluticasone  (FLONASE ) 50 MCG/ACT nasal spray Place 2 sprays  into both nostrils daily. (Patient taking differently: Place 2 sprays into both nostrils daily as needed for allergies.) 9.9 mL 2   HYDROcodone  bit-homatropine (HYCODAN) 5-1.5 MG/5ML syrup Take 5 mLs by mouth every 6 (six) hours as needed for cough. 120 mL 0   hydrOXYzine  (ATARAX ) 25 MG tablet Take 1 tablet (25 mg total) by mouth at bedtime as needed. 30 tablet 1   ketoconazole  (NIZORAL ) 2 % shampoo Apply 1 Application topically 2 (two) times a week. 120 mL 1   methimazole  (TAPAZOLE ) 5 MG tablet Take 1 tablet (5 mg total) by mouth 2 (two) times daily. 180 tablet 1   methylPREDNISolone  (MEDROL  DOSEPAK) 4 MG TBPK tablet Take as directed on package 21 each 0   metoprolol  succinate (TOPROL -XL) 50 MG 24 hr tablet Take 1.5 tablets (75 mg total) by mouth daily. Take with or immediately following a meal. 135 tablet 1   Misc. Devices MISC Blood Pressure Monitor 1 each 0   polyethylene glycol powder (GLYCOLAX /MIRALAX ) 17 GM/SCOOP powder Take 17 g by mouth daily. 3350 g 1   polyethylene glycol powder (GLYCOLAX /MIRALAX ) 17 GM/SCOOP powder Dissolve 17 grams into water and drink by mouth every day. (Patient not taking: Reported on 01/27/2024) 3570 g 1   potassium chloride  SA (KLOR-CON  M20) 20 MEQ tablet Take 3 tablets (60 mEq total) by mouth daily. 180 tablet 1   sacubitril -valsartan  (ENTRESTO ) 49-51 MG Take 1 tablet by mouth 2 (two) times daily. 90 tablet 3   Semaglutide , 1 MG/DOSE, 4 MG/3ML SOPN Inject 1 mg as directed once a week. 3 mL 3   Semaglutide ,0.25 or 0.5MG /DOS, (OZEMPIC , 0.25 OR 0.5 MG/DOSE,) 2 MG/3ML SOPN Inject 0.25 mg into the skin once a week. For 4 weeks then increase to 0.5mg  (Patient taking differently: Inject 0.25 mg into the skin every Wednesday. For 4 weeks then increase to 0.5mg ) 3 mL 0   Semaglutide ,0.25 or 0.5MG /DOS, (OZEMPIC , 0.25 OR 0.5 MG/DOSE,) 2 MG/3ML SOPN Inject 0.5 mg into the skin once a week. For 4 weeks then increase to 1mg  3 mL 0   spironolactone  (ALDACTONE ) 25 MG tablet Take 1  tablet (25 mg total) by mouth daily. 90 tablet 1   torsemide  (DEMADEX ) 20 MG tablet Take 1 tablet (20 mg total) by mouth daily. 90 tablet 1   triamcinolone  cream (KENALOG ) 0.1 % Apply 1 Application topically 2 (two) times daily. (Patient taking differently: Apply 1 Application topically 2 (two) times daily as needed (skin irritation.).) 30 g 0   No current facility-administered medications on file prior to visit.    ROS: See HPI  Observations/Objective: Awake, alert, oriented x3 Not in acute distress Normal mood      Latest Ref Rng & Units 02/01/2024    6:57 AM 02/01/2024    6:54 AM 01/30/2024    1:09 PM  CMP  Glucose 70 - 99 mg/dL 856  857  94   BUN 6 - 20 mg/dL 7  8  11    Creatinine  0.44 - 1.00 mg/dL 9.29  9.27  9.27   Sodium 135 - 145 mmol/L 140  138  139   Potassium 3.5 - 5.1 mmol/L 3.6  3.7  3.0   Chloride 98 - 111 mmol/L 106  105  104   CO2 22 - 32 mmol/L  21  23   Calcium  8.9 - 10.3 mg/dL  9.0  8.7     Lipid Panel     Component Value Date/Time   CHOL 121 07/06/2023 1149   TRIG 68 07/06/2023 1149   HDL 52 07/06/2023 1149   CHOLHDL 2.3 07/06/2023 1149   CHOLHDL 3.5 04/12/2023 0727   VLDL 12 04/12/2023 0727   LDLCALC 55 07/06/2023 1149   LABVLDL 14 07/06/2023 1149    Lab Results  Component Value Date   HGBA1C 7.4 (A) 01/04/2024     Assessment and plan:   Assessment & Plan Acute bronchitis Acute bronchitis with wheezing and coughing. No pneumonia on chest x-ray. - Continue Augmentin  every 12 hours. - Use nebulizer solution for breathing treatments. - Use cough syrup as needed. - Send nebulizer solution prescription to [facility] pharmacy. - Contact if additional cough syrup is needed.     No orders of the defined types were placed in this encounter.   Follow Up Instructions: Keep previously scheduled appointment    I discussed the assessment and treatment plan with the patient. The patient was provided an opportunity to ask  questions and all were answered. The patient agreed with the plan and demonstrated an understanding of the instructions.   The patient was advised to call back or seek an in-person evaluation if the symptoms worsen or if the condition fails to improve as anticipated.     I provided 13 minutes total of Telehealth time during this encounter including median intraservice time, reviewing previous notes, investigations, ordering medications, medical decision making, coordinating care and patient verbalized understanding at the end of the visit.     Corrina Sabin, MD, FAAFP. [facility] [place], [place] [phone]   02/06/2024, 11:48 AM

## 2024-02-06 NOTE — Patient Instructions (Signed)
 VISIT SUMMARY:  Today, you were seen for shortness of breath, wheezing, and coughing, which have worsened your bronchitis. You were previously prescribed Augmentin , a nebulizer solution, and an inhaler at urgent care. Your chest x-ray showed no pneumonia.  YOUR PLAN:  -ACUTE BRONCHITIS: Acute bronchitis is an inflammation of the bronchial tubes in the lungs, often causing coughing and difficulty breathing. You should continue taking Augmentin  every 12 hours, use the nebulizer solution for breathing treatments, and take cough syrup as needed. We will send the nebulizer solution prescription to [facility] pharmacy. Please contact us  if you need additional cough syrup.  INSTRUCTIONS:  Please continue with your current medications and treatments. If you need more cough syrup or have any concerns, contact our office. Follow up if your symptoms do not improve or worsen.

## 2024-02-06 NOTE — Progress Notes (Unsigned)
 Virtual Visit via Video Note  I connected with Chelsea Branch, on 02/06/2024 at 8:36 AM by video enabled telemedicine device and verified that I am speaking with the correct person using two identifiers.   Consent: I discussed the limitations, risks, security and privacy concerns of performing an evaluation and management service by telemedicine and the availability of in person appointments. I also discussed with the patient that there may be a patient responsible charge related to this service. The patient expressed understanding and agreed to proceed.   Location of Patient: ***  Location of Provider: ***   Persons participating in Telemedicine visit: [name] Rumble Dr. Delbert    Discussed the use of AI scribe software for clinical note transcription with the patient, who gave verbal consent to proceed.  History of Present Illness       Past Medical History:  Diagnosis Date   Abnormal thyroid  blood test 03/15/2017   Abnormal uterine bleeding    Alopecia    Anemia    Angina pectoris with normal coronary arteriogram (HCC) 2017   Had + Troponin c/w ? 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 iniatially ?2/2 peripartum in 1994 - improved by 2008 then worsening EF in 2011 back down to EF 25-30%. b. echo 01/21/14 showed mod LVH, [name]; 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   Peripartum cardiomyopathy 1994   Sleep apnea 2015   CPAP 12/2013   Stroke (HCC) 2011   Systolic and diastolic CHF,  acute on chronic (HCC) 06/14/2015   Tobacco abuse    Type 2 diabetes mellitus (HCC) 04/28/2023   Allergies  Allergen Reactions   Ace Inhibitors Cough   Jardiance  [Empagliflozin ] Rash    Yeast infection/rash/UTI    Current Outpatient Medications on File Prior to Visit  Medication Sig Dispense Refill   acetaminophen  (TYLENOL ) 500 MG tablet Take 1,000 mg by mouth as needed for mild pain (pain score 1-3) or moderate pain (pain score 4-6).     albuterol  (VENTOLIN  HFA) 108 (90 Base) MCG/ACT inhaler Inhale 2 puffs into the lungs every 4 (four) hours as needed for wheezing or shortness of breath. 18 g 0   amoxicillin -clavulanate (AUGMENTIN ) 875-125 MG tablet Take 1 tablet by mouth every 12 (twelve) hours. 14 tablet 0   aspirin  81 MG chewable tablet Chew 1 tablet (81 mg total) by mouth daily. 30 tablet 1   atorvastatin  (LIPITOR) 40 MG tablet Take 1 tablet (40 mg total) by mouth daily. 90 tablet 1   FLUoxetine  (PROZAC ) 20 MG capsule Take 1 capsule (20 mg total) by mouth daily. 90 capsule 1   fluticasone  (FLONASE ) 50 MCG/ACT nasal spray Place 2 sprays into both nostrils daily. (Patient taking differently: Place 2 sprays into both nostrils daily as needed for allergies.) 9.9 mL 2   HYDROcodone  bit-homatropine (HYCODAN) 5-1.5 MG/5ML syrup Take 5 mLs by mouth every 6 (six) hours as needed for cough. 120 mL 0   hydrOXYzine  (ATARAX ) 25 MG tablet Take  1 tablet (25 mg total) by mouth at bedtime as needed. 30 tablet 1   ketoconazole  (NIZORAL ) 2 % shampoo Apply 1 Application topically 2 (two) times a week. 120 mL 1   methimazole  (TAPAZOLE ) 5 MG tablet Take 1 tablet (5 mg total) by mouth 2 (two) times daily. 180 tablet 1   methylPREDNISolone  (MEDROL  DOSEPAK) 4 MG TBPK tablet Take as directed on package 21 each 0   metoprolol  succinate (TOPROL -XL) 50 MG 24 hr tablet Take 1.5 tablets (75 mg total) by mouth daily. Take with or immediately following a meal. 135 tablet 1   Misc. Devices MISC Blood Pressure Monitor 1  each 0   polyethylene glycol powder (GLYCOLAX /MIRALAX ) 17 GM/SCOOP powder Take 17 g by mouth daily. 3350 g 1   polyethylene glycol powder (GLYCOLAX /MIRALAX ) 17 GM/SCOOP powder Dissolve 17 grams into water and drink by mouth every day. (Patient not taking: Reported on 01/27/2024) 3570 g 1   potassium chloride  SA (KLOR-CON  M20) 20 MEQ tablet Take 3 tablets (60 mEq total) by mouth daily. 180 tablet 1   sacubitril -valsartan  (ENTRESTO ) 49-51 MG Take 1 tablet by mouth 2 (two) times daily. 90 tablet 3   Semaglutide , 1 MG/DOSE, 4 MG/3ML SOPN Inject 1 mg as directed once a week. 3 mL 3   Semaglutide ,0.25 or 0.5MG /DOS, (OZEMPIC , 0.25 OR 0.5 MG/DOSE,) 2 MG/3ML SOPN Inject 0.25 mg into the skin once a week. For 4 weeks then increase to 0.5mg  (Patient taking differently: Inject 0.25 mg into the skin every Wednesday. For 4 weeks then increase to 0.5mg ) 3 mL 0   Semaglutide ,0.25 or 0.5MG /DOS, (OZEMPIC , 0.25 OR 0.5 MG/DOSE,) 2 MG/3ML SOPN Inject 0.5 mg into the skin once a week. For 4 weeks then increase to 1mg  3 mL 0   spironolactone  (ALDACTONE ) 25 MG tablet Take 1 tablet (25 mg total) by mouth daily. 90 tablet 1   torsemide  (DEMADEX ) 20 MG tablet Take 1 tablet (20 mg total) by mouth daily. 90 tablet 1   triamcinolone  cream (KENALOG ) 0.1 % Apply 1 Application topically 2 (two) times daily. (Patient taking differently: Apply 1 Application topically 2 (two) times daily as needed (skin irritation.).) 30 g 0   No current facility-administered medications on file prior to visit.    ROS: See HPI  Observations/Objective: Awake, alert, oriented x3 Not in acute distress Normal mood      Latest Ref Rng & Units 02/01/2024    6:57 AM 02/01/2024    6:54 AM 01/30/2024    1:09 PM  CMP  Glucose 70 - 99 mg/dL 856  857  94   BUN 6 - 20 mg/dL 7  8  11    Creatinine 0.44 - 1.00 mg/dL 9.29  9.27  9.27   Sodium 135 - 145 mmol/L 140  138  139   Potassium 3.5 - 5.1 mmol/L 3.6  3.7  3.0   Chloride 98 - 111 mmol/L 106  105  104    CO2 22 - 32 mmol/L  21  23   Calcium  8.9 - 10.3 mg/dL  9.0  8.7     Lipid Panel     Component Value Date/Time   CHOL 121 07/06/2023 1149   TRIG 68 07/06/2023 1149   HDL 52 07/06/2023 1149   CHOLHDL 2.3 07/06/2023 1149   CHOLHDL 3.5 04/12/2023 0727   VLDL 12 04/12/2023 0727   LDLCALC 55 07/06/2023 1149   LABVLDL 14 07/06/2023 1149    Lab Results  Component Value Date   HGBA1C 7.4 (A)  01/04/2024     Assessment and plan:  1. Bronchitis (Primary) ***    Meds ordered this encounter  Medications   DISCONTD: albuterol  (PROVENTIL ) (2.5 MG/3ML) 0.083% nebulizer solution    Sig: Take 3 mLs (2.5 mg total) by nebulization every 6 (six) hours as needed for wheezing or shortness of breath.    Dispense:  75 mL    Refill:  12   albuterol  (PROVENTIL ) (2.5 MG/3ML) 0.083% nebulizer solution    Sig: Take 3 mLs (2.5 mg total) by nebulization every 6 (six) hours as needed for wheezing or shortness of breath.    Dispense:  75 mL    Refill:  12    Follow Up Instructions: ***   I discussed the assessment and treatment plan with the patient. The patient was provided an opportunity to ask questions and all were answered. The patient agreed with the plan and demonstrated an understanding of the instructions.   The patient was advised to call back or seek an in-person evaluation if the symptoms worsen or if the condition fails to improve as anticipated.     I provided *** minutes total of Telehealth time during this encounter including median intraservice time, reviewing previous notes, investigations, ordering medications, medical decision making, coordinating care and patient verbalized understanding at the end of the visit.     Corrina Sabin, MD, FAAFP. [facility] [place], [place] [phone]   02/06/2024, 8:36 AM

## 2024-02-07 ENCOUNTER — Telehealth: Payer: Self-pay

## 2024-02-07 ENCOUNTER — Other Ambulatory Visit: Payer: Self-pay

## 2024-02-07 NOTE — Telephone Encounter (Signed)
 Copied from CRM [id number]. Topic: Clinical - Medication Question >> Feb 07, 2024 12:22 PM Dedra B wrote: Reason for CRM: Pt wants to know if albuterol  tablets can be called in for her to help her bronchitis.

## 2024-02-08 NOTE — Telephone Encounter (Signed)
Patient was called and informed that medication has been sent to pharmacy. 

## 2024-02-09 ENCOUNTER — Ambulatory Visit: Attending: Cardiology

## 2024-02-09 DIAGNOSIS — I420 Dilated cardiomyopathy: Secondary | ICD-10-CM | POA: Diagnosis not present

## 2024-02-09 DIAGNOSIS — Z950 Presence of cardiac pacemaker: Secondary | ICD-10-CM | POA: Insufficient documentation

## 2024-02-09 NOTE — Patient Instructions (Signed)

## 2024-02-09 NOTE — Progress Notes (Signed)
 Normal single chamber pacemaker wound check. Presenting rhythm: VS 82. Wound well healed. Routine testing performed. Thresholds, sensing, and impedance consistent with implant measurements and at 3.5V safety margin/auto capture until 3 month visit. No episodes. Reviewed arm restrictions to continue for 6 weeks total post op.  Pt enrolled in remote follow-up.

## 2024-02-14 ENCOUNTER — Other Ambulatory Visit: Payer: Self-pay

## 2024-02-16 ENCOUNTER — Telehealth: Payer: Self-pay | Admitting: Cardiology

## 2024-02-16 NOTE — Telephone Encounter (Signed)
 Left detailed message for Pt advising no calls from device clinic.  Left number to call back if any further concerns.

## 2024-02-16 NOTE — Telephone Encounter (Signed)
 Spoke with pt and explained that I do not see where someone from our office tried to call recently, the last interaction I see documented is the 9/18 wound check visit. Will send to device to make sure no one from their team was trying to get in touch with pt. Advised pt if they have any concerns, to please let us  know. Pt verbalized understanding of plan.

## 2024-02-16 NOTE — Telephone Encounter (Signed)
 Pt said a nurse called her yesterday and she is returning the call.

## 2024-02-17 ENCOUNTER — Ambulatory Visit: Payer: Self-pay

## 2024-02-17 ENCOUNTER — Ambulatory Visit (HOSPITAL_COMMUNITY): Admitting: Physician Assistant

## 2024-02-17 NOTE — Telephone Encounter (Signed)
 FYI Only or Action Required?: Action required by provider: request for appointment and update on patient condition.  Patient was last seen in primary care on 02/06/2024 by Delbert Clam, MD.  Called Nurse Triage reporting Fatigue.  Symptoms began a week ago.  Interventions attempted: Rest, hydration, or home remedies.  Symptoms are: unchanged.  Triage Disposition: See PCP When Office is Open (Within 3 Days)  Patient/caregiver understands and will follow disposition?: Unsure  Copied from CRM #[id number]. Topic: Clinical - Medical Advice >> Feb 17, 2024  2:08 PM Olam RAMAN wrote: Reason for CRM: pt is calling and wanting to know what meds to take for low energy. If possible to prescribe C/b [phone] Reason for Disposition  [1] Fatigue (i.e., tires easily, decreased energy) AND [2] persists > 1 week  Additional Information  Negative: [1] MODERATE weakness (e.g., interferes with work, school, normal activities) AND [2] persists > 3 days    Not weak. Low energy  Answer Assessment - Initial Assessment Questions Additional info: Appointment needed: No appointments are available with PCP until October 27, next available at clinic location is not until Oct 1. Patient needs appointment, she has travel restrictions and needs two day notice for scheduling. Please follow up to assist.     1. DESCRIPTION: Describe how you are feeling.     Low energy  2. SEVERITY: How bad is it?  Can you stand and walk?     Able to care for self  3. ONSET: When did these symptoms begin? (e.g., hours, days, weeks, months)     One week ago 4. CAUSE: What do you think is causing the weakness or fatigue? (e.g., not drinking enough fluids, medical problem, trouble sleeping)     Unsure-has new defibrillator on bed rest for one month 5. NEW MEDICINES:  Have you started on any new medicines recently? (e.g., opioid pain medicines, benzodiazepines, muscle relaxants, antidepressants, antihistamines,  neuroleptics, beta blockers)     yes 6. OTHER SYMPTOMS: Do you have any other symptoms? (e.g., chest pain, fever, cough, SOB, vomiting, diarrhea, bleeding, other areas of pain)     Denies  Protocols used: Weakness (Generalized) and Fatigue-A-AH

## 2024-02-17 NOTE — Telephone Encounter (Signed)
 pt is calling and wanting to know what meds to take for low energy. If possible to prescribe C/b [phone]

## 2024-02-20 ENCOUNTER — Other Ambulatory Visit: Payer: Self-pay

## 2024-02-20 NOTE — Telephone Encounter (Signed)
 She needs an appointment.  Can be scheduled with any available provider or the mobile unit.

## 2024-02-21 ENCOUNTER — Ambulatory Visit: Payer: Self-pay

## 2024-02-21 ENCOUNTER — Other Ambulatory Visit: Payer: Self-pay

## 2024-02-21 ENCOUNTER — Ambulatory Visit (HOSPITAL_COMMUNITY): Admission: EM | Admit: 2024-02-21 | Discharge: 2024-02-21 | Disposition: A | Discharge status: Home / Self Care

## 2024-02-21 ENCOUNTER — Encounter (HOSPITAL_COMMUNITY): Payer: Self-pay

## 2024-02-21 ENCOUNTER — Telehealth: Payer: Self-pay | Admitting: Cardiology

## 2024-02-21 DIAGNOSIS — J4520 Mild intermittent asthma, uncomplicated: Secondary | ICD-10-CM

## 2024-02-21 DIAGNOSIS — R609 Edema, unspecified: Secondary | ICD-10-CM | POA: Diagnosis not present

## 2024-02-21 NOTE — Discharge Instructions (Addendum)
 [facility] or  [place] [facility] or local pharmacy like [facility]  should have compression stockings Please follow up with  your cardiologist or PCP for medication changes/refill on albuterol (it appears you have 12 refills left). If you have new or worsening issues(chest pain,shortness of breath or worsening issues) go to Er for further evaluation.

## 2024-02-21 NOTE — ED Provider Notes (Signed)
 [facility]    CSN: [id number] Arrival date & time: 02/21/24  1151      History   Chief Complaint Chief Complaint  Patient presents with   Joint Swelling   Medication Refill   Fatigue    HPI Chelsea Branch is a 58 y.o. female.   58 year old female, Chelsea Branch, presents to urgent care for evaluation of bilateral ankle swelling and fatigue x 1 week. Patient states she had an ICD implant placed 3 weeks ago. Pt reports hx of CHF, requests albuterol  inhaler. Pt denies chest pain,palpitations or SOB. Pt states she sits in her recliner so feet are up more.  Pt states she has cut back on smoking, when asked to clarify what amount she cut back to pt states she quit smoking 3 weeks prior.  The history is provided by the patient. No language interpreter was used.    Past Medical History:  Diagnosis Date   Abnormal thyroid  blood test 03/15/2017   Abnormal uterine bleeding    Alopecia    Anemia    Angina pectoris with normal coronary arteriogram 2017   Had + Troponin c/w ? 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 iniatially ?2/2 peripartum in 1994 - improved by 2008 then worsening EF in 2011 back down to EF 25-30%. b. echo 01/21/14 showed mod LVH, [name]; 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   History of stroke with residual effects 07/06/2023   Frequent falls 07/06/2023   Type 2 diabetes mellitus (HCC) 04/28/2023   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) 04/28/2023   History of cerebellar stroke 04/13/2023   History of stroke 04/11/2023   Liletta  IUD (intrauterine device) in place since 12/15/2015 04/06/2023   Chronic pain syndrome 12/28/2021   Primary osteoarthritis of both knees 12/28/2021   Spinal stenosis of lumbar region 07/21/2021   Lumbar radiculitis 07/21/2021   COPD (chronic obstructive pulmonary disease) (HCC) 07/21/2021   Mechanical complication due to intrauterine contraceptive device 07/21/2021   Arthropathy of lumbar facet joint 03/14/2019   Degenerative disc disease at L5-S1 level 10/10/2018   Hyperthyroidism 04/26/2016   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   History of non-ST elevation myocardial infarction (NSTEMI) 06/14/2015   Homelessness 09/16/2014   Vitamin D  deficiency 01/29/2014   OSA (obstructive sleep apnea) 01/08/2014   Chronic combined systolic and diastolic CHF (congestive heart failure) (HCC) 08/14/2013   Essential hypertension    Fibroids 08/09/2012   Menorrhagia 05/08/2012   Female pattern hair loss    HLD (hyperlipidemia) 06/08/2010   CEREBRAL ANEURYSM 06/08/2010  History of cardiovascular disorder 06/08/2010    Past Surgical History:  Procedure Laterality Date   CARDIAC CATHETERIZATION N/A 06/16/2015   Procedure: Left Heart Cath and Coronary Angiography;  Surgeon: Candyce GORMAN Reek, MD;  Location: [facility] INVASIVE CV LAB;  Service: Cardiovascular;  Laterality: N/A;   CESAREAN SECTION  1992  1994   ICD IMPLANT N/A 01/30/2024   Procedure: ICD IMPLANT;  Surgeon: Kennyth Chew, MD;  Location: [facility] INVASIVE CV LAB;  Service: Cardiovascular;  Laterality: N/A;    INTRAUTERINE DEVICE (IUD) INSERTION     about 3 years ago   LOOP RECORDER IMPLANT  ~ 2000   spine injections      [facility],  Dr Darlis   TIBIAL TUBERCLERPLASTY     TRANSESOPHAGEAL ECHOCARDIOGRAM (CATH LAB) N/A 04/20/2023   Procedure: TRANSESOPHAGEAL ECHOCARDIOGRAM;  Surgeon: Alvan Ronal BRAVO, MD;  Location: [facility] INVASIVE CV LAB;  Service: Cardiovascular;  Laterality: N/A;   TUBAL LIGATION  05/24/1992    OB History     Gravida  2   Para  2   Term  2   Preterm      AB      Living  3      SAB      IAB      Ectopic      Multiple  1   Live Births  3            Home Medications    Prior to Admission medications   Medication Sig Start Date End Date Taking? Authorizing Provider  acetaminophen  (TYLENOL ) 500 MG tablet Take 1,000 mg by mouth as needed for mild pain (pain score 1-3) or moderate pain (pain score 4-6).    [provider]  albuterol  (PROVENTIL ) (2.5 MG/3ML) 0.083% nebulizer solution Take 3 mLs (2.5 mg total) by nebulization every 6 (six) hours as needed for wheezing or shortness of breath. 02/06/24   Newlin, Enobong, MD  albuterol  (VENTOLIN  HFA) 108 (90 Base) MCG/ACT inhaler Inhale 2 puffs into the lungs every 4 (four) hours as needed for wheezing or shortness of breath. 01/25/24   Newlin, Enobong, MD  amoxicillin -clavulanate (AUGMENTIN ) 875-125 MG tablet Take 1 tablet by mouth every 12 (twelve) hours. Patient not taking: Reported on 02/21/2024 02/05/24   Dreama, Georgia  N, FNP  aspirin  81 MG chewable tablet Chew 1 tablet (81 mg total) by mouth daily. 04/14/23   Lenard Calin, MD  atorvastatin  (LIPITOR) 40 MG tablet Take 1 tablet (40 mg total) by mouth daily. 11/17/23   Newlin, Enobong, MD  FLUoxetine  (PROZAC ) 20 MG capsule Take 1 capsule (20 mg total) by mouth daily. 11/03/23   Newlin, Enobong, MD  fluticasone  (FLONASE ) 50 MCG/ACT nasal spray Place 2 sprays into both nostrils daily. Patient taking differently: Place 2 sprays into both  nostrils daily as needed for allergies. 04/10/23   Rising, Asberry, PA-C  HYDROcodone  bit-homatropine (HYCODAN) 5-1.5 MG/5ML syrup Take 5 mLs by mouth every 6 (six) hours as needed for cough. Patient not taking: Reported on 02/21/2024 02/05/24   Dreama, Georgia  N, FNP  hydrOXYzine  (ATARAX ) 25 MG tablet Take 1 tablet (25 mg total) by mouth at bedtime as needed. 11/03/23   Newlin, Enobong, MD  ketoconazole  (NIZORAL ) 2 % shampoo Apply 1 Application topically 2 (two) times a week. 08/18/23   Fleming, Zelda W, NP  methimazole  (TAPAZOLE ) 5 MG tablet Take 1 tablet (5 mg total) by mouth 2 (two) times daily. 09/14/23   Newlin, Enobong, MD  methylPREDNISolone  (MEDROL  DOSEPAK) 4 MG TBPK  tablet Take as directed on package Patient not taking: Reported on 02/21/2024 02/01/24   Smoot, Lauraine LABOR, PA-C  metoprolol  succinate (TOPROL -XL) 50 MG 24 hr tablet Take 1.5 tablets (75 mg total) by mouth daily. Take with or immediately following a meal. 08/26/23   Thukkani, Arun K, MD  Misc. Devices MISC Blood Pressure Monitor 10/20/23   Delbert Clam, MD  polyethylene glycol powder (GLYCOLAX /MIRALAX ) 17 GM/SCOOP powder Take 17 g by mouth daily. 12/29/23   Newlin, Enobong, MD  polyethylene glycol powder (GLYCOLAX /MIRALAX ) 17 GM/SCOOP powder Dissolve 17 grams into water and drink by mouth every day. Patient not taking: Reported on 01/27/2024 12/29/23     potassium chloride  SA (KLOR-CON  M20) 20 MEQ tablet Take 3 tablets (60 mEq total) by mouth daily. 01/30/24   Kennyth Chew, MD  sacubitril -valsartan  (ENTRESTO ) 49-51 MG Take 1 tablet by mouth 2 (two) times daily. 08/26/23   Thukkani, Arun K, MD  Semaglutide , 1 MG/DOSE, 4 MG/3ML SOPN Inject 1 mg as directed once a week. 01/04/24   Newlin, Enobong, MD  Semaglutide ,0.25 or 0.5MG /DOS, (OZEMPIC , 0.25 OR 0.5 MG/DOSE,) 2 MG/3ML SOPN Inject 0.25 mg into the skin once a week. For 4 weeks then increase to 0.5mg  Patient taking differently: Inject 0.25 mg into the skin every Wednesday. For 4 weeks then  increase to 0.5mg  01/04/24   Newlin, Enobong, MD  Semaglutide ,0.25 or 0.5MG /DOS, (OZEMPIC , 0.25 OR 0.5 MG/DOSE,) 2 MG/3ML SOPN Inject 0.5 mg into the skin once a week. For 4 weeks then increase to 1mg  01/04/24   Newlin, Enobong, MD  spironolactone  (ALDACTONE ) 25 MG tablet Take 1 tablet (25 mg total) by mouth daily. 08/26/23   Thukkani, Arun K, MD  torsemide  (DEMADEX ) 20 MG tablet Take 1 tablet (20 mg total) by mouth daily. 04/28/23   Newlin, Enobong, MD  triamcinolone  cream (KENALOG ) 0.1 % Apply 1 Application topically 2 (two) times daily. Patient taking differently: Apply 1 Application topically 2 (two) times daily as needed (skin irritation.). 01/04/24   Delbert Clam, MD    Family History Family History  Problem Relation Age of Onset   Healthy Mother    Stroke Sister    Hypertension Sister    Cancer Maternal Grandmother        uterine   Other Neg Hx    Heart disease Neg Hx     Social History Social History   Tobacco Use   Smoking status: Former    Current packs/day: 0.00    Average packs/day: 0.1 packs/day for 30.0 years (3.0 ttl pk-yrs)    Types: Cigarettes    Start date: 06/02/1985    Quit date: 06/03/2015    Years since quitting: 8.7   Smokeless tobacco: Never   Tobacco comments:    Pt. stated she stopped smoking a year ago. 09/29/2018  Vaping Use   Vaping status: Never Used  Substance Use Topics   Alcohol use: No    Alcohol/week: 0.0 standard drinks of alcohol   Drug use: No     Allergies   Ace inhibitors and Jardiance  [empagliflozin ]   Review of Systems Review of Systems  Constitutional:  Positive for fatigue.  HENT:  Negative for congestion.   Respiratory:  Negative for cough, shortness of breath, wheezing and stridor.   Cardiovascular:  Positive for leg swelling. Negative for chest pain and palpitations.  All other systems reviewed and are negative.    Physical Exam Triage Vital Signs ED Triage Vitals [02/21/24 1251]  Encounter Vitals Group     BP  107/75     Girls Systolic BP Percentile      Girls Diastolic BP Percentile      Boys Systolic BP Percentile      Boys Diastolic BP Percentile      Pulse Rate 78     Resp 16     Temp 99 F (37.2 C)     Temp Source Oral     SpO2 93 %     Weight      Height      Head Circumference      Peak Flow      Pain Score 8     Pain Loc      Pain Education      Exclude from Growth Chart    No data found.  Updated Vital Signs BP 107/75 (BP Location: Right Arm)   Pulse 78   Temp 99 F (37.2 C) (Oral)   Resp 16   SpO2 93%   Visual Acuity Right Eye Distance:   Left Eye Distance:   Bilateral Distance:    Right Eye Near:   Left Eye Near:    Bilateral Near:     Physical Exam Vitals and nursing note reviewed.  Constitutional:      General: She is not in acute distress.    Appearance: She is well-developed and well-groomed.  HENT:     Head: Normocephalic and atraumatic.  Eyes:     Conjunctiva/sclera: Conjunctivae normal.  Cardiovascular:     Rate and Rhythm: Normal rate and regular rhythm.     Heart sounds: Normal heart sounds. No murmur heard. Pulmonary:     Effort: Pulmonary effort is normal. No respiratory distress.     Breath sounds: Normal breath sounds.  Abdominal:     Palpations: Abdomen is soft.     Tenderness: There is no abdominal tenderness.  Musculoskeletal:        General: No swelling.     Cervical back: Neck supple.     Right lower leg: 1+ Pitting Edema present.     Left lower leg: 1+ Pitting Edema present.  Skin:    General: Skin is warm and dry.     Capillary Refill: Capillary refill takes less than 2 seconds.     Comments: ICD healing well  Neurological:     General: No focal deficit present.     Mental Status: She is alert and oriented to person, place, and time.     GCS: GCS eye subscore is 4. GCS verbal subscore is 5. GCS motor subscore is 6.  Psychiatric:        Attention and Perception: Attention normal.        Mood and Affect: Mood normal.         Speech: Speech normal.        Behavior: Behavior normal. Behavior is cooperative.      UC Treatments / Results  Labs (all labs ordered are listed, but only abnormal results are displayed) Labs Reviewed - No data to display  EKG   Radiology No results found.  Procedures Procedures (including critical care time)  Medications Ordered in UC Medications - No data to display  Initial Impression / Assessment and Plan / UC Course  I have reviewed the triage vital signs and the nursing notes.  Pertinent labs & imaging results that were available during my care of the patient were reviewed by me and considered in my medical decision making (see chart for details).    Discussed exam  findings and plan of care with patient, strict go to ER precautions given.   Patient verbalized understanding to this provider.  Ddx: Dependent edema, fatigue, anxiety Final Clinical Impressions(s) / UC Diagnoses   Final diagnoses:  Dependent edema     Discharge Instructions      [facility] Medical supply or  [place] medical supply or local pharmacy like [facility]  should have compression stockings Please follow up with  your cardiologist or PCP for medication changes/refill on albuterol (it appears you have 12 refills left). If you have new or worsening issues(chest pain,shortness of breath or worsening issues) go to Er for further evaluation.      ED Prescriptions   None    PDMP not reviewed this encounter.   Aminta Loose, NP 02/21/24 1711

## 2024-02-21 NOTE — Telephone Encounter (Signed)
 Call to patient no answer vm left

## 2024-02-21 NOTE — Telephone Encounter (Signed)
 Pt c/o swelling: STAT is pt has developed SOB within 24 hours  How much weight have you gained and in what time span?  No weight gain   If swelling, where is the swelling located? Below ankles--Patient plans on going to urgent care.  Are you currently taking a fluid pill?  Yes   Are you currently SOB?  No   Do you have a log of your daily weights (if so, list)?  206 lbs   Have you gained 3 pounds in a day or 5 pounds in a week?   Have you traveled recently?  No

## 2024-02-21 NOTE — Telephone Encounter (Signed)
 Patient calling c/o of swelling in both  legs for about week, but denies sob, lightheadedness or dizziness .Per patient she reached out to  her PCP. Per pt she was advise by her PCP to  go to [facility].  Pt verbalized she will be going to Urgent care,Made patient aware to call office for appt. ED precautions reviewed with pt. Pt verbalized an understanding

## 2024-02-21 NOTE — ED Triage Notes (Signed)
 Patient reports that she has bilateral ankle swelling  and fatigue x 1 week. Patient states that she had an ICD implant placed 3 weeks ago. Patient reports a history of CHF.  Patient states that she needs an Albuterol  inhaler.

## 2024-02-21 NOTE — Telephone Encounter (Signed)
 Bilateral ankle swelling x 1 week Had a defibrillator put in place about 2-3 weeks ago Denies any other symptoms at this time She is advised that it is recommended to be seen and evaluated today---No available appointments in PCP office or surrounding offices within the region--Urgent Care is offered as an alternative at this time and the Emergency Room. Patient states she will go get this evaluated today. Patient is advised that if anything worsens to go to the Emergency Room or call 911. Patient verbalized understanding.  FYI Only or Action Required?: FYI only for provider.  Patient was last seen in primary care on 02/06/2024 by Delbert Clam, MD.  Called Nurse Triage reporting Joint Swelling.  Symptoms began a week ago.  Interventions attempted: Rest, hydration, or home remedies.  Symptoms are: gradually worsening.  Triage Disposition: See HCP Within 4 Hours (Or PCP Triage)  Patient/caregiver understands and will follow disposition?: Yes              Copied from CRM [id number]. Topic: Clinical - Red Word Triage >> Feb 21, 2024 10:50 AM Pinkey ORN wrote: Red Word that prompted transfer to Nurse Triage: Swelling >> Feb 21, 2024 10:51 AM Pinkey ORN wrote: Patient states she's experiencing some swelling in both her ankles.  Reason for Disposition  [1] Thigh, calf, or ankle swelling AND [2] bilateral AND [3] 1 side is more swollen  Answer Assessment - Initial Assessment Questions Patient states that Dr Kennyth put defibrillator in about 2-3 weeks ago For the past week--both ankles have been swelling Patient says there is some mild pain when walking and she had some prescription pain medicine but she is out of that medication She states that she can take extra strength tylenol  Patient states that she was advised when they put the defibrillator in that if she experienced any swelling to come back to be evaluated. She states she also called her Cardiologist but has not  heard back from them She is advised that it is recommended to be seen and evaluated today---No available appointments in PCP office or surrounding offices within the region--Urgent Care is offered as an alternative at this time and the Emergency Room. Patient states she will go get this evaluated today. Patient is advised that if anything worsens to go to the Emergency Room or call 911. Patient verbalized understanding.    1. LOCATION: Which ankle is swollen? Where is the swelling?     both 2. ONSET: When did the swelling start?     A week ago 3. SWELLING: How bad is the swelling? Or, How large is it? (e.g., mild, moderate, severe; size of localized swelling)      mild 4. PAIN: Is there any pain? If Yes, ask: How bad is it? (Scale 0-10; or none, mild, moderate, severe)     Just a little bit when walking--feels tight 5. CAUSE: What do you think caused the ankle swelling?     Probably fluid 6. OTHER SYMPTOMS: Do you have any other symptoms? (e.g., fever, chest pain, difficulty breathing, calf pain)     Patient denies  Protocols used: Ankle Swelling-A-AH

## 2024-02-22 ENCOUNTER — Encounter: Payer: Self-pay | Admitting: Speech Pathology

## 2024-02-22 ENCOUNTER — Ambulatory Visit: Attending: Critical Care Medicine | Admitting: Speech Pathology

## 2024-02-22 DIAGNOSIS — R4701 Aphasia: Secondary | ICD-10-CM | POA: Insufficient documentation

## 2024-02-22 DIAGNOSIS — R471 Dysarthria and anarthria: Secondary | ICD-10-CM | POA: Insufficient documentation

## 2024-02-22 NOTE — Therapy (Signed)
 OUTPATIENT SPEECH LANGUAGE PATHOLOGY TREATMENT   Patient Name: Chelsea Branch MRN: [id number] DOB:[birth year], 58 y.o., female Today's Date: 02/22/2024  PCP: Delbert Clam, MD REFERRING PROVIDER: Delbert Clam, MD  END OF SESSION:  End of Session - 02/22/24 1326     Visit Number 2    Number of Visits 7    Date for Recertification  03/13/24    Authorization Type [employer] - no auth required    Authorization Time Period 17/27    SLP Start Time 1315    SLP Stop Time  1400    SLP Time Calculation (min) 45 min    Activity Tolerance Patient tolerated treatment well          Past Medical History:  Diagnosis Date   Abnormal thyroid  blood test 03/15/2017   Abnormal uterine bleeding    Alopecia    Anemia    Angina pectoris with normal coronary arteriogram 2017   Had + Troponin c/w ? 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 iniatially ?2/2 peripartum in 1994 - improved by 2008 then worsening EF in 2011 back down to EF 25-30%. b. echo 01/21/14 showed mod LVH, [name]; 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   Peripartum cardiomyopathy 1994   Sleep apnea 2015   CPAP 12/2013   Stroke (HCC) 2011   Systolic and diastolic CHF, acute on chronic (HCC) 06/14/2015   Tobacco abuse    Type 2 diabetes mellitus (HCC) 04/28/2023   Past Surgical History:  Procedure Laterality Date   CARDIAC CATHETERIZATION N/A 06/16/2015   Procedure: Left Heart Cath and Coronary Angiography;  Surgeon: Candyce GORMAN Reek,  MD;  Location: [facility] INVASIVE CV LAB;  Service: Cardiovascular;  Laterality: N/A;   CESAREAN SECTION  1992  1994   ICD IMPLANT N/A 01/30/2024   Procedure: ICD IMPLANT;  Surgeon: Kennyth Chew, MD;  Location: [facility] INVASIVE CV LAB;  Service: Cardiovascular;  Laterality: N/A;   INTRAUTERINE DEVICE (IUD) INSERTION     about 3 years ago   LOOP RECORDER IMPLANT  ~ 2000   spine injections      [facility],  Dr Darlis   TIBIAL TUBERCLERPLASTY     TRANSESOPHAGEAL ECHOCARDIOGRAM (CATH LAB) N/A 04/20/2023   Procedure: TRANSESOPHAGEAL ECHOCARDIOGRAM;  Surgeon: Alvan Ronal BRAVO, MD;  Location: 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) 04/28/2023   History of cerebellar stroke 04/13/2023   History of stroke 04/11/2023   Liletta  IUD (intrauterine device) in place since 12/15/2015 04/06/2023   Chronic pain syndrome 12/28/2021   Primary osteoarthritis of both knees 12/28/2021   Spinal stenosis of lumbar region 07/21/2021  Lumbar radiculitis 07/21/2021   COPD (chronic obstructive pulmonary disease) (HCC) 07/21/2021   Mechanical complication due to intrauterine contraceptive device 07/21/2021   Arthropathy of lumbar facet joint 03/14/2019   Degenerative disc disease at L5-S1 level 10/10/2018   Hyperthyroidism 04/26/2016   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   History of non-ST elevation myocardial infarction (NSTEMI) 06/14/2015   Homelessness 09/16/2014   Vitamin D  deficiency 01/29/2014   OSA (obstructive sleep apnea) 01/08/2014   Chronic combined systolic and diastolic CHF (congestive heart failure) (HCC) 08/14/2013    Essential hypertension    Fibroids 08/09/2012   Menorrhagia 05/08/2012   Female pattern hair loss    HLD (hyperlipidemia) 06/08/2010   CEREBRAL ANEURYSM 06/08/2010   History of cardiovascular disorder 06/08/2010    ONSET DATE: 10/24/2023 (referral date)   REFERRING DIAG: I69.30 (ICD-10-CM) - History of stroke with residual effects  THERAPY DIAG:  Aphasia  Dysarthria and anarthria  Rationale for Evaluation and Treatment: Rehabilitation  SUBJECTIVE:   SUBJECTIVE STATEMENT: I still have to think about it re: using clear speech Pt accompanied by: self  PERTINENT HISTORY: Chelsea Branch is a 58 yo female presenting to ED 11/18 with expressive aphasia, R UE weakness, and confusion. MRI Brain with acute infarcts in the acute occipital lobe, posterior aspect of L insula, and L cerebellar hemisphere. TNK administered. PMH includes prior CVA (2011), OSA, tobacco use, chronic combined CHF, HTN, NSTEMI (2017), NIDCM   PAIN: Are you having pain? No  FALLS: Has patient fallen in last 6 months?  No   The Communicative Participation Item Bank        Does your condition interfere with... Pt Rating   ...talking with people you know 2   ...communicating when you need to say something quickly 2   ...talking with people you do not know 3   ...communicating when you are out in your community 2   ...asking questions in a conversation 1   ....communicating in a small group of people 2   ...having a long conversation 2   ...giving detailed infomrmation 2   ...getting your turn in a fast moving conversation 2   ...trying to persuade a friend or family member to see a different point of view 2  3= Not at all; 2=A little; 1=Quite a bit; 0=Very much                                                                                                                             TREATMENT DATE:   02/22/24: Michaelann reports some improvement in speech as her speech is clear today - she continues to have to  make effort to use slow rate and over articulation to achieve clear, intelligible speech. Targeted word finding in structured complex naming task with given letter - [name] named 20/24 words with occasional min semantic and phonemic cues. Targeted verbal compensations for word finding in structured task describing objects - Shanvi required questioning cues and semantic cues  to generate the most salient descriptions and to differentiate from a similar object (ex: paper clip vs tape). In conversation, 1 word finding episode which required questioning cues to carryover verbal compensations.   12/20/23: evaluation only d/t payor source requirement   PATIENT EDUCATION: Education details: POC, syllable segmentation, slow rate  Person educated: Patient Education method: Explanation Education comprehension: verbalized understanding and needs further education   GOALS: Goals reviewed with patient? Yes  LONG TERM GOALS: Target date: 03/13/2024 (extended d/t scheduling)  Pt will use verbal compensations for word finding in conversation with rare min A  Baseline: limited compensation for word finding  Goal status: ONGOING  2.  Pt will complete moderately complex structured language task (discourse level) with 90% accuracy to aid in language rehabilitation  Baseline: mild aphasia Goal status:ONGOING  3.  Pt will utilize processing compensations during 15+ minute conversation in 80% of opportunities, evidenced by appropriate responses/comments  Baseline: inconsistent use of compensations Goal status:ONGOING  4.  Pt will report improved communication effectiveness via PROM by 2 points by LTG date Baseline: CPIB=20 Goal status: ONGOING   ASSESSMENT:  CLINICAL IMPRESSION: Patient is a 58 y.o. F who was seen today for ST evaluation for residual aphasia. Completed several ST sessions earlier in year but did not complete POC for unknown reason. Today, pt presents with ongoing mild aphasia and mild  dysarthria c/b intermittent anomia, semantic/phonemic paraphasias, and vague speech. Occasional imprecise articulation evident. Impaired auditory comprehension also intermittently observed, with occasional compensations utilized. Pt aware of speech changes, which reportedly impact daily communication needs and social engagement. Pt would benefit from additional ST services to optimize communication effectiveness.   OBJECTIVE IMPAIRMENTS: include aphasia. These impairments are limiting patient from effectively communicating at home and in community. Factors affecting potential to achieve goals and functional outcome are financial resources and family/community support. Patient will benefit from skilled SLP services to address above impairments and improve overall function.  REHAB POTENTIAL: Good  PLAN:  SLP FREQUENCY: 1x/week  SLP DURATION: 12 weeks (extended for scheduling)  PLANNED INTERVENTIONS: Language facilitation, Cueing hierachy, Internal/external aids, Functional tasks, Multimodal communication approach, SLP instruction and feedback, Compensatory strategies, Patient/family education, and [id number] Treatment of speech (30 or 45 min)   Check all possible CPT codes: [phone] - SLP treatment    Check all conditions that are expected to impact treatment: Social determinants of health   If treatment provided at initial evaluation, no treatment charged due to lack of authorization.    Nashonda Limberg, Leita Caldron, CCC-SLP 02/22/2024, 2:11 PM

## 2024-02-22 NOTE — Telephone Encounter (Signed)
 Patient has been scheduled and informed.

## 2024-02-23 ENCOUNTER — Other Ambulatory Visit: Payer: Self-pay

## 2024-02-23 ENCOUNTER — Telehealth: Payer: Self-pay | Admitting: Family Medicine

## 2024-02-23 ENCOUNTER — Ambulatory Visit: Attending: Family Medicine | Admitting: Family Medicine

## 2024-02-23 ENCOUNTER — Encounter: Payer: Self-pay | Admitting: Family Medicine

## 2024-02-23 DIAGNOSIS — E0590 Thyrotoxicosis, unspecified without thyrotoxic crisis or storm: Secondary | ICD-10-CM | POA: Diagnosis not present

## 2024-02-23 DIAGNOSIS — R5383 Other fatigue: Secondary | ICD-10-CM

## 2024-02-23 DIAGNOSIS — E559 Vitamin D deficiency, unspecified: Secondary | ICD-10-CM

## 2024-02-23 MED ORDER — METHIMAZOLE 5 MG PO TABS
5.0000 mg | ORAL_TABLET | Freq: Two times a day (BID) | ORAL | 1 refills | Status: DC
  Filled 2024-02-23 – 2024-03-05 (×2): qty 180, 90d supply, fill #0

## 2024-02-23 NOTE — Telephone Encounter (Signed)
 Appointment scheduled for lab tomorrow at 11 AM.

## 2024-02-23 NOTE — Progress Notes (Signed)
 Virtual Visit via Video Note  I connected with Chelsea Branch, on 02/23/2024 at 11:14 AM by video enabled telemedicine device and verified that I am speaking with the correct person using two identifiers.   Consent: I discussed the limitations, risks, security and privacy concerns of performing an evaluation and management service by telemedicine and the availability of in person appointments. I also discussed with the patient that there may be a patient responsible charge related to this service. The patient expressed understanding and agreed to proceed.   Location of Patient: Home  Location of Provider: Clinic   Persons participating in Telemedicine visit: Chelsea Branch Dr. Delbert    Discussed the use of AI scribe software for clinical note transcription with the patient, who gave verbal consent to proceed.  History of Present Illness Chelsea Branch is a 58 year old female with a history of NSTEMI in 2017, NICM (EF 34%, LV global hypokinesis, LVH, dilated LV, grade 1 DD, mild to moderate MR from echo of 05/2023 status post ICD placement in 01/2024), hypertension, OSA, right occipital and left cerebellar hemispheric CVA status post TNK therapy with residual dysarthria, hyperthyroidism, type 2 diabetes mellitus  who presents with low energy.  She has experienced low energy for the past one to two weeks. Her sleep is not affected, but she believes her unstable living situation may contribute to her fatigue. She had a low vitamin D  levels in 2015 . Last hemoglobin was normal 2 weeks ago.  She takes methimazole  5 mg twice daily for hyperthyroidism, with normal thyroid  tests in August. She has not taken her Ozempic  injection for diabetes in the past one to two weeks due to her ICD implant which was done 3 weeks ago.      Past Medical History:  Diagnosis Date   Abnormal thyroid  blood test 03/15/2017   Abnormal uterine bleeding    Alopecia    Anemia    Angina pectoris with normal coronary  arteriogram 2017   Had + Troponin c/w ? 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 iniatially ?2/2 peripartum in 1994 - improved by 2008 then worsening EF in 2011 back down to EF 25-30%. b. echo 01/21/14 showed mod LVH, [name]; 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   Peripartum cardiomyopathy 1994   Sleep apnea 2015   CPAP 12/2013   Stroke (HCC) 2011   Systolic and diastolic CHF, acute on chronic (HCC) 06/14/2015   Tobacco abuse    Type 2 diabetes mellitus (HCC) 04/28/2023   Allergies  Allergen Reactions   Ace Inhibitors Cough   Jardiance  [Empagliflozin ] Rash    Yeast infection/rash/UTI    Current Outpatient Medications on File Prior to Visit  Medication Sig Dispense Refill   acetaminophen  (TYLENOL ) 500 MG tablet Take 1,000 mg by mouth as needed for mild pain (pain score 1-3) or moderate pain (pain score 4-6).     albuterol  (PROVENTIL ) (2.5 MG/3ML) 0.083% nebulizer solution Take 3 mLs (2.5 mg total)  by nebulization every 6 (six) hours as needed for wheezing or shortness of breath. 75 mL 12   albuterol  (VENTOLIN  HFA) 108 (90 Base) MCG/ACT inhaler Inhale 2 puffs into the lungs every 4 (four) hours as needed for wheezing or shortness of breath. 18 g 0   amoxicillin -clavulanate (AUGMENTIN ) 875-125 MG tablet Take 1 tablet by mouth every 12 (twelve) hours. (Patient not taking: Reported on 02/21/2024) 14 tablet 0   aspirin  81 MG chewable tablet Chew 1 tablet (81 mg total) by mouth daily. 30 tablet 1   atorvastatin   (LIPITOR) 40 MG tablet Take 1 tablet (40 mg total) by mouth daily. 90 tablet 1   FLUoxetine  (PROZAC ) 20 MG capsule Take 1 capsule (20 mg total) by mouth daily. 90 capsule 1   fluticasone  (FLONASE ) 50 MCG/ACT nasal spray Place 2 sprays into both nostrils daily. (Patient taking differently: Place 2 sprays into both nostrils daily as needed for allergies.) 9.9 mL 2   HYDROcodone  bit-homatropine (HYCODAN) 5-1.5 MG/5ML syrup Take 5 mLs by mouth every 6 (six) hours as needed for cough. (Patient not taking: Reported on 02/21/2024) 120 mL 0   hydrOXYzine  (ATARAX ) 25 MG tablet Take 1 tablet (25 mg total) by mouth at bedtime as needed. 30 tablet 1   ketoconazole  (NIZORAL ) 2 % shampoo Apply 1 Application topically 2 (two) times a week. 120 mL 1   methimazole  (TAPAZOLE ) 5 MG tablet Take 1 tablet (5 mg total) by mouth 2 (two) times daily. 180 tablet 1   methylPREDNISolone  (MEDROL  DOSEPAK) 4 MG TBPK tablet Take as directed on package (Patient not taking: Reported on 02/21/2024) 21 each 0   metoprolol  succinate (TOPROL -XL) 50 MG 24 hr tablet Take 1.5 tablets (75 mg total) by mouth daily. Take with or immediately following a meal. 135 tablet 1   Misc. Devices MISC Blood Pressure Monitor 1 each 0   polyethylene glycol powder (GLYCOLAX /MIRALAX ) 17 GM/SCOOP powder Take 17 g by mouth daily. 3350 g 1   polyethylene glycol powder (GLYCOLAX /MIRALAX ) 17 GM/SCOOP powder Dissolve 17 grams into water and drink by mouth every day. (Patient not taking: Reported on 01/27/2024) 3570 g 1   potassium chloride  SA (KLOR-CON  M20) 20 MEQ tablet Take 3 tablets (60 mEq total) by mouth daily. 180 tablet 1   sacubitril -valsartan  (ENTRESTO ) 49-51 MG Take 1 tablet by mouth 2 (two) times daily. 90 tablet 3   Semaglutide , 1 MG/DOSE, 4 MG/3ML SOPN Inject 1 mg as directed once a week. 3 mL 3   Semaglutide ,0.25 or 0.5MG /DOS, (OZEMPIC , 0.25 OR 0.5 MG/DOSE,) 2 MG/3ML SOPN Inject 0.25 mg into the skin once a week. For 4 weeks then increase to 0.5mg   (Patient taking differently: Inject 0.25 mg into the skin every Wednesday. For 4 weeks then increase to 0.5mg ) 3 mL 0   Semaglutide ,0.25 or 0.5MG /DOS, (OZEMPIC , 0.25 OR 0.5 MG/DOSE,) 2 MG/3ML SOPN Inject 0.5 mg into the skin once a week. For 4 weeks then increase to 1mg  3 mL 0   spironolactone  (ALDACTONE ) 25 MG tablet Take 1 tablet (25 mg total) by mouth daily. 90 tablet 1   torsemide  (DEMADEX ) 20 MG tablet Take 1 tablet (20 mg total) by mouth daily. 90 tablet 1   triamcinolone  cream (KENALOG ) 0.1 % Apply 1 Application topically 2 (two) times daily. (Patient taking differently: Apply 1 Application topically 2 (two) times daily as needed (skin irritation.).) 30 g 0   No current facility-administered medications on file prior to visit.    ROS: See HPI  Observations/Objective: Awake, alert,  oriented x3 Not in acute distress Normal mood      Latest Ref Rng & Units 02/01/2024    6:57 AM 02/01/2024    6:54 AM 01/30/2024    1:09 PM  CMP  Glucose 70 - 99 mg/dL 856  857  94   BUN 6 - 20 mg/dL 7  8  11    Creatinine 0.44 - 1.00 mg/dL 9.29  9.27  9.27   Sodium 135 - 145 mmol/L 140  138  139   Potassium 3.5 - 5.1 mmol/L 3.6  3.7  3.0   Chloride 98 - 111 mmol/L 106  105  104   CO2 22 - 32 mmol/L  21  23   Calcium  8.9 - 10.3 mg/dL  9.0  8.7     Lipid Panel     Component Value Date/Time   CHOL 121 07/06/2023 1149   TRIG 68 07/06/2023 1149   HDL 52 07/06/2023 1149   CHOLHDL 2.3 07/06/2023 1149   CHOLHDL 3.5 04/12/2023 0727   VLDL 12 04/12/2023 0727   LDLCALC 55 07/06/2023 1149   LABVLDL 14 07/06/2023 1149    Lab Results  Component Value Date   HGBA1C 7.4 (A) 01/04/2024    Lab Results  Component Value Date   TSH 0.937 01/04/2024     Assessment and plan:   Assessment & Plan  Fatigue Vitamin D  deficiency Fatigue for 1-2 weeks with adequate sleep. Anemia ruled out; vitamin D  deficiency suspected. -Thyroid  panel normal - Order blood test for vitamin D  levels. - Follow up on  vitamin D  results and prescribe if low.  Hyperthyroidism Hyperthyroidism managed with methimazole  5 mg twice daily. Thyroid  function normal in August. - Continue methimazole  5 mg orally twice daily. - Follow up with thyroid  specialist as scheduled.       No orders of the defined types were placed in this encounter.   Follow Up Instructions: Return for previously scheduled appointment.    I discussed the assessment and treatment plan with the patient. The patient was provided an opportunity to ask questions and all were answered. The patient agreed with the plan and demonstrated an understanding of the instructions.   The patient was advised to call back or seek an in-person evaluation if the symptoms worsen or if the condition fails to improve as anticipated.     I provided 12 minutes total of Telehealth time during this encounter including median intraservice time, reviewing previous notes, investigations, ordering medications, medical decision making, coordinating care and patient verbalized understanding at the end of the visit.     Corrina Sabin, MD, FAAFP. [facility] [place], [place] [phone]   02/23/2024, 11:14 AM

## 2024-02-23 NOTE — Telephone Encounter (Signed)
-----   Message from [place] sent at 02/23/2024 11:19 AM EDT ----- Hello, Please schedule her for labs tomorrow at 11 AM. Thank you, - Dr. Newlin

## 2024-02-24 ENCOUNTER — Encounter: Payer: Self-pay | Admitting: *Deleted

## 2024-02-24 ENCOUNTER — Ambulatory Visit: Attending: Family Medicine

## 2024-02-24 ENCOUNTER — Telehealth: Payer: Self-pay | Admitting: *Deleted

## 2024-02-24 ENCOUNTER — Other Ambulatory Visit: Payer: Self-pay

## 2024-02-24 DIAGNOSIS — R5383 Other fatigue: Secondary | ICD-10-CM

## 2024-02-24 DIAGNOSIS — E559 Vitamin D deficiency, unspecified: Secondary | ICD-10-CM | POA: Diagnosis not present

## 2024-02-24 NOTE — Patient Instructions (Signed)
 [name] Rumble - I am sorry I was unable to reach you today for our scheduled appointment. I work with Newlin, Enobong, MD and am calling to support your healthcare needs. Please contact me at [phone] at your earliest convenience. I look forward to speaking with you soon.   Thank you,   Olam Ku, RN, BSN [place]  [facility], [facility] RN Care Manager Direct Dial: [phone]  Fax: [phone]

## 2024-02-25 LAB — VITAMIN D 25 HYDROXY (VIT D DEFICIENCY, FRACTURES): Vit D, 25-Hydroxy: 15.3 ng/mL — ABNORMAL LOW (ref 30.0–100.0)

## 2024-02-27 ENCOUNTER — Other Ambulatory Visit: Payer: Self-pay | Admitting: Internal Medicine

## 2024-02-27 ENCOUNTER — Ambulatory Visit: Payer: Self-pay | Admitting: Internal Medicine

## 2024-02-27 MED ORDER — VITAMIN D (ERGOCALCIFEROL) 1.25 MG (50000 UNIT) PO CAPS
50000.0000 [IU] | ORAL_CAPSULE | ORAL | 1 refills | Status: AC
  Filled 2024-02-27: qty 12, 84d supply, fill #0
  Filled 2024-05-22: qty 12, 84d supply, fill #1

## 2024-02-28 ENCOUNTER — Ambulatory Visit: Payer: Self-pay

## 2024-02-28 ENCOUNTER — Other Ambulatory Visit: Payer: Self-pay

## 2024-02-28 ENCOUNTER — Telehealth: Payer: Self-pay | Admitting: Family Medicine

## 2024-02-28 NOTE — Telephone Encounter (Signed)
 Pt states that she was seen and given benzonatate  (TESSALON ) 100 MG capsule, pt states she is feeling overall better but is suffering from the cough ongoing. States that the pearls do help. Pt is requesting more of the pearls. Denies fever. States she is coughing up clear phlegm. Denies SOB, denies difficulty breathing. Last OV- 02/23/24. Please call pt back when refill is placed.  Reason for Disposition . Cough lasts > 3 weeks  Protocols used: Cough - Chronic-A-AH

## 2024-02-28 NOTE — Telephone Encounter (Signed)
 Patient going to [facility] tomorrow

## 2024-02-28 NOTE — Telephone Encounter (Unsigned)
 Copied from CRM #[id number]. Topic: Clinical - Medication Refill >> Feb 28, 2024 11:11 AM Turkey B wrote: Medication: benzonatate  (TESSALON ) 100 MG capsule  Has the patient contacted their pharmacy? no  This is the patient's preferred pharmacy:  [facility] [address], [address] [place] [address] Phone: [phone] Fax: [phone]  Is this the correct pharmacy for this prescription? yes If no, delete pharmacy and type the correct one.   Has the prescription been filled recently? no  Is the patient out of the medication? yes  Has the patient been seen for an appointment in the last year OR does the patient have an upcoming appointment? yes  Can we respond through MyChart? no  Agent: Please be advised that Rx refills may take up to 3 business days. We ask that you follow-up with your pharmacy.

## 2024-02-28 NOTE — Progress Notes (Signed)
 The patient attended a screening event on 01/06/2024  where her BP screening results was 122/87. At the event the patient noted she has [place] Medicaid [employer] and does not smoke. Patient indicated having food, housing and transportation SDOH insecurities on 12/30/2023. Pt listed pcp as Corrina Sabin, MD. Per chart review pt has a pcp and the last office visit was 02/23/2024 for fatigue via telemedicine. According to chart review pt problem list is hypertension and type 2 diabetes. Chart review also indicates a future appt with pcp on 04/11/2024. Post event initial f/u [facility] called pt on 02/28/2024 to follow up on food, housing and transportation resources pt gave consent to utilize the [employer] and stated she would like to be contacted via phone by the agencies she is referred to. Letter sent with [place] [employer] for food, housing and transportation resource flyers in case needed by pt. Additional pt f/u to be scheduled per health equity protocol.

## 2024-02-29 ENCOUNTER — Telehealth: Payer: Self-pay | Admitting: *Deleted

## 2024-02-29 ENCOUNTER — Encounter: Payer: Self-pay | Admitting: Speech Pathology

## 2024-02-29 ENCOUNTER — Ambulatory Visit: Admitting: Speech Pathology

## 2024-02-29 ENCOUNTER — Encounter: Payer: Self-pay | Admitting: *Deleted

## 2024-02-29 DIAGNOSIS — R4701 Aphasia: Secondary | ICD-10-CM | POA: Diagnosis not present

## 2024-02-29 NOTE — Therapy (Signed)
 OUTPATIENT SPEECH LANGUAGE PATHOLOGY TREATMENT   Patient Name: Chelsea Branch MRN: [id number] DOB:May 08, 1966, 58 y.o., female Today's Date: 02/29/2024  PCP: Delbert Clam, MD REFERRING PROVIDER: Delbert Clam, MD  END OF SESSION:  End of Session - 02/29/24 1236     Visit Number 3    Number of Visits 7    Date for Recertification  03/13/24    Authorization Type [employer] Medicaid - no auth required    Authorization Time Period 18    Authorization - Visit Number 27    SLP Start Time 1234    SLP Stop Time  1315    SLP Time Calculation (min) 41 min    Activity Tolerance Patient tolerated treatment well          Past Medical History:  Diagnosis Date   Abnormal thyroid  blood test 03/15/2017   Abnormal uterine bleeding    Alopecia    Anemia    Angina pectoris with normal coronary arteriogram 2017   Had + Troponin c/w ? 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 iniatially ?2/2 peripartum in 1994 - improved by 2008 then worsening EF in 2011 back down to EF 25-30%. b. echo 01/21/14 showed mod LVH, [name]; 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   Peripartum cardiomyopathy 1994   Sleep apnea 2015   CPAP 12/2013   Stroke (HCC) 2011   Systolic and diastolic CHF, acute on chronic (HCC) 06/14/2015   Tobacco abuse    Type 2 diabetes mellitus (HCC) 04/28/2023   Past Surgical History:  Procedure Laterality Date   CARDIAC CATHETERIZATION N/A 06/16/2015   Procedure: Left Heart Cath and Coronary Angiography;   Surgeon: Candyce GORMAN Reek, MD;  [facility] INVASIVE CV LAB;  Service: Cardiovascular;  Laterality: N/A;   CESAREAN SECTION  1992  1994   ICD IMPLANT N/A 01/30/2024   Procedure: ICD IMPLANT;  Surgeon: Kennyth Chew, MD;  Location: [facility] INVASIVE CV LAB;  Service: Cardiovascular;  Laterality: N/A;   INTRAUTERINE DEVICE (IUD) INSERTION     about 3 years ago   LOOP RECORDER IMPLANT  ~ 2000   spine injections      [facility],  Dr Darlis   TIBIAL TUBERCLERPLASTY     TRANSESOPHAGEAL ECHOCARDIOGRAM (CATH LAB) N/A 04/20/2023   Procedure: TRANSESOPHAGEAL ECHOCARDIOGRAM;  Surgeon: Alvan Ronal BRAVO, MD;  Location: [facility] INVASIVE CV LAB;  Service: Cardiovascular;  Laterality: N/A;   TUBAL LIGATION  05/24/1992   Patient Active Problem List   Diagnosis Date Noted   Dependent edema 02/21/2024   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   History of stroke with residual effects 07/06/2023   Frequent falls 07/06/2023   Type 2 diabetes mellitus (HCC) 04/28/2023   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) 04/28/2023   History of cerebellar stroke 04/13/2023   History of stroke 04/11/2023   Liletta  IUD (intrauterine device) in place since 12/15/2015 04/06/2023   Chronic pain syndrome 12/28/2021   Primary osteoarthritis of both knees 12/28/2021  Spinal stenosis of lumbar region 07/21/2021   Lumbar radiculitis 07/21/2021   COPD (chronic obstructive pulmonary disease) (HCC) 07/21/2021   Mechanical complication due to intrauterine contraceptive device 07/21/2021   Arthropathy of lumbar facet joint 03/14/2019   Degenerative disc disease at L5-S1 level 10/10/2018   Hyperthyroidism 04/26/2016   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   History of non-ST elevation myocardial infarction (NSTEMI) 06/14/2015   Homelessness 09/16/2014   Vitamin D  deficiency 01/29/2014   OSA (obstructive sleep apnea) 01/08/2014   Chronic combined systolic and diastolic CHF (congestive heart  failure) (HCC) 08/14/2013   Essential hypertension    Fibroids 08/09/2012   Menorrhagia 05/08/2012   Female pattern hair loss    HLD (hyperlipidemia) 06/08/2010   CEREBRAL ANEURYSM 06/08/2010   History of cardiovascular disorder 06/08/2010    ONSET DATE: 10/24/2023 (referral date)   REFERRING DIAG: I69.30 (ICD-10-CM) - History of stroke with residual effects  THERAPY DIAG:  Aphasia  Rationale for Evaluation and Treatment: Rehabilitation  SUBJECTIVE:   SUBJECTIVE STATEMENT: I still have to think about it re: using clear speech Pt accompanied by: self  PERTINENT HISTORY: Sammantha Branch is a 58 yo female presenting to ED 11/18 with expressive aphasia, R UE weakness, and confusion. MRI Brain with acute infarcts in the acute occipital lobe, posterior aspect of L insula, and L cerebellar hemisphere. TNK administered. PMH includes prior CVA (2011), OSA, tobacco use, chronic combined CHF, HTN, NSTEMI (2017), NIDCM   PAIN: Are you having pain? No  FALLS: Has patient fallen in last 6 months?  No   The Communicative Participation Item Bank        Does your condition interfere with... Pt Rating   ...talking with people you know 2   ...communicating when you need to say something quickly 2   ...talking with people you do not know 3   ...communicating when you are out in your community 2   ...asking questions in a conversation 1   ....communicating in a small group of people 2   ...having a long conversation 2   ...giving detailed infomrmation 2   ...getting your turn in a fast moving conversation 2   ...trying to persuade a friend or family member to see a different point of view 2  3= Not at all; 2=A little; 1=Quite a bit; 0=Very much                                                                                                                             TREATMENT DATE:   02/29/24: Targeted verbal compensations for word finding in structured task generating 2-3 salient  descriptions of objects/words. Yeily required occasional min to mod questioning cues to generate most salient descriptions (ex: camera she said take pictures - mod A to verbalize how a camera was different from a phone) Generated 3 similarities and differences for 3 sets of related words to train verbal compensations for word finding. In conversation, she required verbal  cues describe it to me to use compensations 2/4 opportunities.  02/22/24: Eilleen reports some improvement in speech as her speech is clear today - she continues to have to make effort to use slow rate and over articulation to achieve clear, intelligible speech. Targeted word finding in structured complex naming task with given letter - [name] named 20/24 words with occasional min semantic and phonemic cues. Targeted verbal compensations for word finding in structured task describing objects - Eyva required questioning cues and semantic cues to generate the most salient descriptions and to differentiate from a similar object (ex: paper clip vs tape). In conversation, 1 word finding episode which required questioning cues to carryover verbal compensations.   12/20/23: evaluation only d/t payor source requirement   PATIENT EDUCATION: Education details: POC, syllable segmentation, slow rate  Person educated: Patient Education method: Explanation Education comprehension: verbalized understanding and needs further education   GOALS: Goals reviewed with patient? Yes  LONG TERM GOALS: Target date: 03/13/2024 (extended d/t scheduling)  Pt will use verbal compensations for word finding in conversation with rare min A  Baseline: limited compensation for word finding  Goal status: ONGOING  2.  Pt will complete moderately complex structured language task (discourse level) with 90% accuracy to aid in language rehabilitation  Baseline: mild aphasia Goal status:ONGOING  3.  Pt will utilize processing compensations during 15+ minute  conversation in 80% of opportunities, evidenced by appropriate responses/comments  Baseline: inconsistent use of compensations Goal status:ONGOING  4.  Pt will report improved communication effectiveness via PROM by 2 points by LTG date Baseline: CPIB=20 Goal status: ONGOING   ASSESSMENT:  CLINICAL IMPRESSION: Patient is a 58 y.o. F who was seen today for ST evaluation for residual aphasia. Completed several ST sessions earlier in year but did not complete POC for unknown reason. Today, pt presents with ongoing mild aphasia and mild dysarthria c/b intermittent anomia, semantic/phonemic paraphasias, and vague speech. Occasional imprecise articulation evident. Impaired auditory comprehension also intermittently observed, with occasional compensations utilized. Pt aware of speech changes, which reportedly impact daily communication needs and social engagement. Pt would benefit from additional ST services to optimize communication effectiveness.   OBJECTIVE IMPAIRMENTS: include aphasia. These impairments are limiting patient from effectively communicating at home and in community. Factors affecting potential to achieve goals and functional outcome are financial resources and family/community support. Patient will benefit from skilled SLP services to address above impairments and improve overall function.  REHAB POTENTIAL: Good  PLAN:  SLP FREQUENCY: 1x/week  SLP DURATION: 12 weeks (extended for scheduling)  PLANNED INTERVENTIONS: Language facilitation, Cueing hierachy, Internal/external aids, Functional tasks, Multimodal communication approach, SLP instruction and feedback, Compensatory strategies, Patient/family education, and [id number] Treatment of speech (30 or 45 min)   Check all possible CPT codes: [phone] - SLP treatment    Check all conditions that are expected to impact treatment: Social determinants of health   If treatment provided at initial evaluation, no treatment charged due to  lack of authorization.    Temperence Zenor, Leita Caldron, CCC-SLP 02/29/2024, 1:19 PM

## 2024-02-29 NOTE — Patient Instructions (Signed)
 [name] Rumble - I am sorry I was unable to reach you today for our scheduled appointment. I work with Newlin, Enobong, MD and am calling to support your healthcare needs. Please contact me at [phone] at your earliest convenience. I look forward to speaking with you soon.   Thank you,   Olam Ku, RN, BSN [place]  [facility], [facility] RN Care Manager Direct Dial: [phone]  Fax: [phone]

## 2024-03-01 ENCOUNTER — Other Ambulatory Visit: Payer: Self-pay

## 2024-03-01 ENCOUNTER — Ambulatory Visit: Payer: Self-pay

## 2024-03-01 NOTE — Telephone Encounter (Signed)
 FYI Only or Action Required?: FYI only for provider.  Patient was last seen in primary care on 02/23/2024 by Newlin, Enobong, MD.  Called Nurse Triage reporting Shortness of Breath and Cough.  Symptoms began Pt has had breathing issues for quite awhile per chart.  Interventions attempted: Other: inhalers - [facility].  Symptoms are: gradually worsening.  Triage Disposition: See HCP Within 4 Hours (Or PCP Triage)  Patient/caregiver understands and will follow disposition?: Yes - no appts available  - pt will go to UC today.                 Copied from CRM #[id number]. Topic: Clinical - Red Word Triage >> Mar 01, 2024  2:20 PM Joesph B wrote: Red Word that prompted transfer to Nurse Triage:  Coughing up clear liquid, consistent coughing, shortness of breath Reason for Disposition  [1] MILD difficulty breathing (e.g., minimal/no SOB at rest, SOB with walking, pulse < 100) AND [2] still present when not coughing  Answer Assessment - Initial Assessment Questions 1. ONSET: When did the cough begin?      Several weeks ago 2. SEVERITY: How bad is the cough today?      moderate 3. SPUTUM: Describe the color of your sputum (e.g., none, dry cough; clear, white, yellow, green)     Pt states she is coughing a lot and bringing up clear fluid  5. DIFFICULTY BREATHING: Are you having difficulty breathing? If Yes, ask: How bad is it? (e.g., mild, moderate, severe)      Yes with cough  Protocols used: Cough - Acute Productive-A-AH

## 2024-03-01 NOTE — Telephone Encounter (Signed)
 noted

## 2024-03-05 ENCOUNTER — Other Ambulatory Visit: Payer: Self-pay

## 2024-03-06 ENCOUNTER — Other Ambulatory Visit: Payer: Self-pay

## 2024-03-07 ENCOUNTER — Ambulatory Visit: Payer: Self-pay

## 2024-03-07 ENCOUNTER — Other Ambulatory Visit: Payer: Self-pay | Admitting: *Deleted

## 2024-03-07 ENCOUNTER — Encounter: Payer: Self-pay | Admitting: *Deleted

## 2024-03-07 ENCOUNTER — Encounter: Admitting: Speech Pathology

## 2024-03-07 NOTE — Telephone Encounter (Signed)
 noted

## 2024-03-07 NOTE — Patient Outreach (Addendum)
 Complex Care Management   Visit Note  03/07/2024  Name:  Chelsea Branch MRN: [id number] DOB: 1966-01-01  Patient contacted RNCM this morning indicating she missed my scheduled call. RN informed patient the appointment telephone call will be at 11:00 am however offered to complete today's assessment at this time based upon the upcoming hour of the scheduled call.  Pt indicated she was having breakfast and requested a call back at 11:00am based upon the scheduled appointment.   RNCM will follow up at 11:00 AM this morning to completed the scheduled appointment.   Olam Ku, RN, BSN   [facility], [facility] RN Care Manager Direct Dial: [phone]  Fax: [phone]

## 2024-03-07 NOTE — Patient Outreach (Signed)
 Complex Care Management   Visit Note  03/07/2024  Name:  Chelsea Branch MRN: [id number] DOB: 18-Dec-1965  Situation: Referral received for Complex Care Management related to Heart Failure I obtained verbal consent from Patient.  Visit completed with Patient  on the phone  Background:   Past Medical History:  Diagnosis Date   Abnormal thyroid  blood test 03/15/2017   Abnormal uterine bleeding    Alopecia    Anemia    Angina pectoris with normal coronary arteriogram 2017   Had + Troponin c/w ? 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 iniatially ?2/2 peripartum in 1994 - improved by 2008 then worsening EF in 2011 back down to EF 25-30%. b. echo 01/21/14 showed mod LVH, [name]; 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   Peripartum cardiomyopathy 1994   Sleep apnea 2015   CPAP 12/2013   Stroke (HCC) 2011   Systolic and diastolic CHF, acute on chronic (HCC) 06/14/2015   Tobacco abuse    Type 2 diabetes mellitus (HCC) 04/28/2023    Assessment: Patient Reported Symptoms:  Cognitive Cognitive Status: Able to follow simple commands, Alert and oriented to person, place, and time, Normal speech and language skills   Health Maintenance Behaviors: Annual physical exam  Neurological Neurological Review of Symptoms: No symptoms reported    HEENT HEENT Symptoms Reported: No symptoms reported HEENT Management Strategies: Routine screening    Cardiovascular Cardiovascular Symptoms Reported: No symptoms  reported Does patient have uncontrolled Hypertension?: No Cardiovascular Management Strategies: Routine screening, Medication therapy Weight: 205 lb (93 kg) Cardiovascular Self-Management Outcome: 4 (good) Cardiovascular Comment: Pt continue to wear her bilateral compression stockings as advised by her provider  Respiratory Respiratory Symptoms Reported: Productive cough Other Respiratory Symptoms: Clear mucus with no fevers with recent completion of antibiotic however continue to have residual symptoms. Reports pending appointment with PCP on 03/09/2024 for further interventions Additional Respiratory Details: Uses scheduled nebulizers when needed. Respiratory Management Strategies: Breathing techniques, Coping strategies, Adequate rest, Routine screening, Medication therapy Respiratory Self-Management Outcome: 4 (good)  Endocrine Endocrine Symptoms Reported: No symptoms reported Is patient diabetic?: Yes Is patient checking blood sugars at home?: No (Pt does not monitor her CGB with last A1C 7.4 (Aug) as pt continue Ozempic ) Endocrine Self-Management Outcome: 4 (good)  Gastrointestinal Gastrointestinal Symptoms Reported: No symptoms reported Gastrointestinal Management Strategies: Coping strategies, Nutrition support, Activity Gastrointestinal Self-Management Outcome: 4 (good)    Genitourinary Genitourinary Symptoms Reported: No symptoms reported    Integumentary Integumentary Symptoms Reported: No symptoms reported    Musculoskeletal Musculoskelatal Symptoms Reviewed: No symptoms reported        Psychosocial Psychosocial Symptoms Reported: No symptoms reported   Major Change/Loss/Stressor/Fears (CP): Denies Techniques to Cope with Loss/Stress/Change:  (Still pending application for [employer] for ogoing OT services.) Quality of Family Relationships: helpful, involved, supportive Do you feel physically threatened by others?: No     There were no vitals filed for this  visit.  Medications Reviewed Today  Reviewed by Alvia Olam BIRCH, RN (Registered Nurse) on 03/07/24 at 1107  Med List Status: <None>   Medication Order Taking? Sig Documenting Provider Last Dose Status Informant  acetaminophen  (TYLENOL ) 500 MG tablet [id number] Yes Take 1,000 mg by mouth as needed for mild pain (pain score 1-3) or moderate pain (pain score 4-6). [provider]  Active Self  albuterol  (PROVENTIL ) (2.5 MG/3ML) 0.083% nebulizer solution [id number] Yes Take 3 mLs (2.5 mg total) by nebulization every 6 (six) hours as needed for wheezing or shortness of breath. Newlin, Enobong, MD  Active   albuterol  (VENTOLIN  HFA) 108 (90 Base) MCG/ACT inhaler [id number] Yes Inhale 2 puffs into the lungs every 4 (four) hours as needed for wheezing or shortness of breath. Newlin, Enobong, MD  Active Self           Med Note RUSH, GUSTAV JONELLE Repress Feb 05, 2024 12:50 PM) PT ran out of med needs refill  amoxicillin -clavulanate (AUGMENTIN ) 875-125 MG tablet [id number]  Take 1 tablet by mouth every 12 (twelve) hours.  Patient not taking: Reported on 02/21/2024   Dreama, [place]  N, FNP  Active   aspirin  81 MG chewable tablet [id number] Yes Chew 1 tablet (81 mg total) by mouth daily. Lenard Calin, MD  Active Self  atorvastatin  (LIPITOR) 40 MG tablet [id number] Yes Take 1 tablet (40 mg total) by mouth daily. Newlin, Enobong, MD  Active Self  FLUoxetine  (PROZAC ) 20 MG capsule [id number] Yes Take 1 capsule (20 mg total) by mouth daily. Newlin, Enobong, MD  Active Self  fluticasone  (FLONASE ) 50 MCG/ACT nasal spray [id number] Yes Place 2 sprays into both nostrils daily. Rising, Asberry, PA-C  Active Self  HYDROcodone  bit-homatropine (HYCODAN) 5-1.5 MG/5ML syrup [id number]  Take 5 mLs by mouth every 6 (six) hours as needed for cough.  Patient not taking: Reported on 02/21/2024   Dreama, [place]  N, FNP  Active   hydrOXYzine  (ATARAX ) 25 MG tablet [id number] Yes Take 1 tablet (25 mg total) by mouth at  bedtime as needed. Newlin, Enobong, MD  Active Self  ketoconazole  (NIZORAL ) 2 % shampoo [id number] Yes Apply 1 Application topically 2 (two) times a week. Theotis Haze ORN, NP  Active Self  methimazole  (TAPAZOLE ) 5 MG tablet [id number] Yes Take 1 tablet (5 mg total) by mouth 2 (two) times daily. Newlin, Enobong, MD  Active   methylPREDNISolone  (MEDROL  DOSEPAK) 4 MG TBPK tablet [id number]  Take as directed on package  Patient not taking: Reported on 02/21/2024   SmootLauraine LABOR, PA-C  Active   metoprolol  succinate (TOPROL -XL) 50 MG 24 hr tablet [id number] Yes Take 1.5 tablets (75 mg total) by mouth daily. Take with or immediately following a meal. Thukkani, Arun K, MD  Active Self  Misc. Devices MISC [id number]  Blood Pressure Monitor Delbert Clam, MD  Active Self  polyethylene glycol powder (GLYCOLAX /MIRALAX ) 17 GM/SCOOP powder [id number] Yes Take 17 g by mouth daily. Newlin, Enobong, MD  Active Self  polyethylene glycol powder (GLYCOLAX /MIRALAX ) 17 GM/SCOOP powder [id number]  Dissolve 17 grams into water and drink by mouth every day.  Patient not taking: Reported on 01/27/2024     Active Self  potassium chloride  SA (KLOR-CON  M20) 20 MEQ tablet [id number] Yes Take 3 tablets (60 mEq total) by mouth daily. Kennyth Chew, MD  Active   sacubitril -valsartan  (ENTRESTO ) 49-51 MG [id number] Yes Take 1 tablet by mouth 2 (two) times daily. Thukkani, Arun K, MD  Active Self  Semaglutide , 1 MG/DOSE, 4 MG/3ML SOPN [id number]  Inject 1 mg as  directed once a week. Newlin, Enobong, MD  Active Self  Semaglutide ,0.25 or 0.5MG /DOS, (OZEMPIC , 0.25 OR 0.5 MG/DOSE,) 2 MG/3ML SOPN [id number] Yes Inject 0.25 mg into the skin once a week. For 4 weeks then increase to 0.5mg   Patient taking differently: Inject 0.25 mg into the skin every Wednesday. For 4 weeks then increase to 0.5mg    Newlin, Enobong, MD  Active Self  Semaglutide ,0.25 or 0.5MG /DOS, (OZEMPIC , 0.25 OR 0.5 MG/DOSE,) 2 MG/3ML SOPN [id number]  Inject 0.5 mg into the skin  once a week. For 4 weeks then increase to 1mg  Newlin, Enobong, MD  Active Self  spironolactone  (ALDACTONE ) 25 MG tablet [id number] Yes Take 1 tablet (25 mg total) by mouth daily. Thukkani, Arun K, MD  Active Self  torsemide  (DEMADEX ) 20 MG tablet [id number] Yes Take 1 tablet (20 mg total) by mouth daily. Newlin, Enobong, MD  Active Self  triamcinolone  cream (KENALOG ) 0.1 % [id number] Yes Apply 1 Application topically 2 (two) times daily.  Patient taking differently: Apply 1 Application topically 2 (two) times daily as needed (skin irritation.).   Newlin, Enobong, MD  Active Self  Vitamin D , Ergocalciferol , (DRISDOL ) 1.25 MG (50000 UNIT) CAPS capsule [id number] Yes Take 1 capsule (50,000 Units total) by mouth every 7 (seven) days. Vicci Barnie NOVAK, MD  Active   Med List Note Beverlee Reyes LELON Bishop 04/09/17 2221):              Recommendation:   PCP Follow-up Continue Current Plan of Care  Follow Up Plan:   Telephone follow up appointment date/time:  04/06/2024 @ 1:00 PM   Olam Ku, RN, BSN [place]  [facility], [facility] RN Care Manager Direct Dial: [phone]  Fax: [phone]

## 2024-03-07 NOTE — Patient Instructions (Signed)
 Visit Information  Ms. Vitug was given information about Medicaid Managed Care team care coordination services as a part of their [facility] Community Plan Medicaid benefit.   If you would like to schedule transportation through your [facility], please call the following number at least 2 days in advance of your appointment: [phone]   Rides for urgent appointments can also be made after hours by calling Member Services.  Call the Behavioral Health Crisis Line at [phone], at any time, 24 hours a day, 7 days a week. If you are in danger or need immediate medical attention call 911.  Please see education materials related to HF exercises provided by MyChart link.  Patient verbalizes understanding of instructions and care plan provided today and agrees to view in MyChart. Active MyChart status and patient understanding of how to access instructions and care plan via MyChart confirmed with patient.     Telephone follow up appointment with Managed Medicaid care management team member scheduled for: 04/06/2024 @ 1:00 PM   Olam Ku, RN, BSN [place]  [facility], [facility] Health RN Care Manager Direct Dial: [phone]  Fax: [phone]    Following is a copy of your plan of care:  There are no care plans that you recently modified to display for this patient.

## 2024-03-07 NOTE — Telephone Encounter (Signed)
 FYI Only or Action Required?: FYI only for provider.  Patient was last seen in primary care on 02/23/2024 by Newlin, Enobong, MD.  Called Nurse Triage reporting Cough and Nasal Congestion.  Symptoms began about a month ago.  Interventions attempted: OTC medications: muccinex, Prescription medications: tessalon , prednisone , and Other: hot tea.  Symptoms are: productive cough with clear mucus, watery eyes, runny nose gradually worsening.  Triage Disposition: See PCP When Office is Open (Within 3 Days) (overriding See Physician Within 24 Hours)  Patient/caregiver understands and will follow disposition?: Yes               Message from Avram MATSU sent at 03/07/2024  8:16 AM EDT  Reason for Triage: patient would like a callback, she has been coughing,runny nose,eyes are watery. Seems like she can't get rid of it. Please advise [phone]   Reason for Disposition  [1] Known COPD or other severe lung disease (i.e., bronchiectasis, cystic fibrosis, lung surgery) AND [2] symptoms getting worse (i.e., increased sputum purulence or amount, increased breathing difficulty  Answer Assessment - Initial Assessment Questions 1. ONSET: When did the cough begin?      About 1 month ago.  2. SEVERITY: How bad is the cough today?      Patient describes the cough as constant but no coughing noted during triage. Patient states she is unable to sleep at night and is constantly coughing, scratchy throat.  3. SPUTUM: Describe the color of your sputum (e.g., none, dry cough; clear, white, yellow, green)     Clear.  4. HEMOPTYSIS: Are you coughing up any blood? If Yes, ask: How much? (e.g., flecks, streaks, tablespoons, etc.)     No.  5. DIFFICULTY BREATHING: Are you having difficulty breathing? If Yes, ask: How bad is it? (e.g., mild, moderate, severe)      No.  6. FEVER: Do you have a fever? If Yes, ask: What is your temperature, how was it measured, and when did it  start?     No.  7. CARDIAC HISTORY: Do you have any history of heart disease? (e.g., heart attack, congestive heart failure)      CHF, HTN, cardiomyopathy.  8. LUNG HISTORY: Do you have any history of lung disease?  (e.g., pulmonary embolus, asthma, emphysema)     OSA, COPD.  9. PE RISK FACTORS: Do you have a history of blood clots? (or: recent major surgery, recent prolonged travel, bedridden)     No.  10. OTHER SYMPTOMS: Do you have any other symptoms? (e.g., runny nose, wheezing, chest pain)       Runny nose, watery eyes. Patient reports she has had a good appetite, eating and drinking enough. Denies wheezing, chest pain, ear aches.  11. PREGNANCY: Is there any chance you are pregnant? When was your last menstrual period?       N/A.  12. TRAVEL: Have you traveled out of the country in the last month? (e.g., travel history, exposures)       No.  Protocols used: Cough - Acute Productive-A-AH

## 2024-03-09 ENCOUNTER — Encounter: Payer: Self-pay | Admitting: Family Medicine

## 2024-03-09 ENCOUNTER — Other Ambulatory Visit: Payer: Self-pay

## 2024-03-09 ENCOUNTER — Ambulatory Visit: Attending: Family Medicine | Admitting: Family Medicine

## 2024-03-09 VITALS — BP 116/75 | HR 80 | Ht 68.0 in | Wt 218.2 lb

## 2024-03-09 DIAGNOSIS — E119 Type 2 diabetes mellitus without complications: Secondary | ICD-10-CM

## 2024-03-09 DIAGNOSIS — J40 Bronchitis, not specified as acute or chronic: Secondary | ICD-10-CM | POA: Diagnosis not present

## 2024-03-09 DIAGNOSIS — I5042 Chronic combined systolic (congestive) and diastolic (congestive) heart failure: Secondary | ICD-10-CM | POA: Diagnosis not present

## 2024-03-09 DIAGNOSIS — Z7985 Long-term (current) use of injectable non-insulin antidiabetic drugs: Secondary | ICD-10-CM

## 2024-03-09 DIAGNOSIS — R053 Chronic cough: Secondary | ICD-10-CM

## 2024-03-09 DIAGNOSIS — Z1211 Encounter for screening for malignant neoplasm of colon: Secondary | ICD-10-CM

## 2024-03-09 DIAGNOSIS — J4520 Mild intermittent asthma, uncomplicated: Secondary | ICD-10-CM

## 2024-03-09 DIAGNOSIS — E0590 Thyrotoxicosis, unspecified without thyrotoxic crisis or storm: Secondary | ICD-10-CM

## 2024-03-09 DIAGNOSIS — E1169 Type 2 diabetes mellitus with other specified complication: Secondary | ICD-10-CM

## 2024-03-09 DIAGNOSIS — I110 Hypertensive heart disease with heart failure: Secondary | ICD-10-CM | POA: Diagnosis not present

## 2024-03-09 MED ORDER — PREDNISONE 5 MG PO TABS
5.0000 mg | ORAL_TABLET | Freq: Every day | ORAL | 0 refills | Status: DC
  Filled 2024-03-09: qty 5, 5d supply, fill #0

## 2024-03-09 MED ORDER — BENZONATATE 200 MG PO CAPS
200.0000 mg | ORAL_CAPSULE | Freq: Two times a day (BID) | ORAL | 0 refills | Status: AC | PRN
  Filled 2024-03-09: qty 20, 10d supply, fill #0

## 2024-03-09 MED ORDER — PREDNISONE 5 MG PO TABS
20.0000 mg | ORAL_TABLET | Freq: Every day | ORAL | 0 refills | Status: DC
  Filled 2024-03-09: qty 5, 1d supply, fill #0

## 2024-03-09 MED ORDER — AZITHROMYCIN 250 MG PO TABS
ORAL_TABLET | ORAL | 0 refills | Status: AC
  Filled 2024-03-09: qty 6, 5d supply, fill #0

## 2024-03-09 MED ORDER — ALBUTEROL SULFATE HFA 108 (90 BASE) MCG/ACT IN AERS
2.0000 | INHALATION_SPRAY | RESPIRATORY_TRACT | 1 refills | Status: DC | PRN
  Filled 2024-03-09: qty 18, 17d supply, fill #0
  Filled 2024-05-22: qty 6.7, 25d supply, fill #1

## 2024-03-09 MED FILL — Loratadine Tab 10 MG: 10.0000 mg | ORAL | 30 days supply | Qty: 30 | Fill #0 | Status: AC

## 2024-03-09 NOTE — Progress Notes (Signed)
 Subjective:  [name], female    DOB: 10-Jun-1965  Age: 58 y.o. MRN: [id number]  CC: Cough     Discussed the use of AI scribe software for clinical note transcription with the patient, who gave verbal consent to proceed.  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  She has experienced a persistent cough for about a month, initially treated with prednisone  and Augmentin  for bronchitis. The cough produces clear, slightly thick mucus. She denies fever, chills, or facial pain. At night, she sometimes experiences mild dyspnea, possibly related to her defibrillator she thinks. She feels generally unwell and has watery eyes.  She manages leg swelling with compression socks and torsemide , noting a decrease in swelling. Recent weight gain is attributed to inconsistent diabetes medication use following ICD implantation. Current medications include Ozempic , which she has been on a titration dose and is currently at 0.5 mg once a week, and loratadine for allergies. She completed her Augmentin  course and is concerned about the cost of cough medication.  Denies presence of fevers or chills but does have some malaise.  Someone in her house has been coughing as well.  She has diabetes with a recent A1c of 7.4 and normal thyroid  function as of August. She awaits an endocrinology appointment for her thyroid  condition. Socially, she seeks Medicaid assistance, SSDI income, and stable housing, currently staying with a friend and sleeping in a recliner to avoid exposure to illness.  She would like an FL 2 form  completed to assist with additional income.    Past Medical History:  Diagnosis Date   Abnormal thyroid  blood test 03/15/2017   Abnormal uterine bleeding    Alopecia    Anemia    Angina pectoris with normal coronary arteriogram 2017   Had + Troponin c/w ? 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 iniatially ?2/2 peripartum in 1994 - improved by 2008 then worsening EF in 2011 back down to EF 25-30%. b. echo 01/21/14 showed mod LVH, [name]; 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   Peripartum cardiomyopathy 1994   Sleep apnea 2015   CPAP 12/2013   Stroke (HCC) 2011   Systolic and diastolic CHF, acute on chronic (HCC) 06/14/2015   Tobacco abuse    Type 2 diabetes mellitus (HCC) 04/28/2023    Past Surgical History:  Procedure Laterality Date   CARDIAC CATHETERIZATION N/A 06/16/2015   Procedure: Left Heart Cath and Coronary Angiography;  Surgeon: Candyce GORMAN Reek, MD;  Location: [facility] INVASIVE CV LAB;  Service: Cardiovascular;  Laterality: N/A;   CESAREAN SECTION  1992  1994  ICD IMPLANT N/A 01/30/2024   Procedure: ICD IMPLANT;  Surgeon: Kennyth Chew, MD;  Location: [facility] INVASIVE CV LAB;  Service: Cardiovascular;  Laterality: N/A;   INTRAUTERINE DEVICE (IUD) INSERTION     about 3 years ago   LOOP RECORDER IMPLANT  ~ 2000   spine injections      [facility],  Dr Darlis   TIBIAL TUBERCLERPLASTY     TRANSESOPHAGEAL ECHOCARDIOGRAM (CATH LAB) N/A 04/20/2023   Procedure: TRANSESOPHAGEAL  ECHOCARDIOGRAM;  Surgeon: Alvan Ronal BRAVO, MD;  Location: [facility] INVASIVE CV LAB;  Service: Cardiovascular;  Laterality: N/A;   TUBAL LIGATION  05/24/1992    Family History  Problem Relation Age of Onset   Healthy Mother    Stroke Sister    Hypertension Sister    Cancer Maternal Grandmother        uterine   Other Neg Hx    Heart disease Neg Hx     Social History   Socioeconomic History   Marital status: Single    Spouse name: Not on file   Number of children: 3   Years of education: 12   Highest education level: Not on file  Occupational History   Occupation: unemployed  Tobacco Use   Smoking status: Former    Current packs/day: 0.00    Average packs/day: 0.1 packs/day for 30.0 years (3.0 ttl pk-yrs)    Types: Cigarettes    Start date: 06/02/1985    Quit date: 06/03/2015    Years since quitting: 8.7   Smokeless tobacco: Never   Tobacco comments:    Pt. stated she stopped smoking a year ago. 09/29/2018  Vaping Use   Vaping status: Never Used  Substance and Sexual Activity   Alcohol use: No    Alcohol/week: 0.0 standard drinks of alcohol   Drug use: No   Sexual activity: Yes    Birth control/protection: I.U.D.    Comment: fibroids  Other Topics Concern   Not on file  Social History Narrative   Lives at home with 59 yo twins Dawn and [place])   12 yo son lives outside the home   94 yo granddaughter       Update 07/12/2023   Stays with her mother, sister, friend here and there, lost her home, signing up for social security, has been sleeping in her car but she doesn't have it anymore. She does not want to go to a shelter. She was working in [employer] work until she had her stroke in November 2024. She gets food stamps and medicaid.   Social Drivers of Corporate investment banker Strain: High Risk (12/23/2023)   Overall Financial Resource Strain (CARDIA)    Difficulty of Paying Living Expenses: Very hard  Food Insecurity: Food Insecurity Present (03/07/2024)    Hunger Vital Sign    Worried About Running Out of Food in the Last Year: Sometimes true    Ran Out of Food in the Last Year: Sometimes true  Transportation Needs: No Transportation Needs (03/07/2024)   PRAPARE - Administrator, Civil Service (Medical): No    Lack of Transportation (Non-Medical): No  Recent Concern: Transportation Needs - Unmet Transportation Needs (12/30/2023)   PRAPARE - Transportation    Lack of Transportation (Medical): No    Lack of Transportation (Non-Medical): Yes  Physical Activity: Not on file  Stress: Stress Concern Present (07/06/2023)   [employer] of Occupational Health - Occupational Stress Questionnaire    Feeling of Stress : Rather much  Social Connections: Moderately Integrated (07/06/2023)   Social Connection and Isolation Panel    Frequency of Communication with Friends and Family: Three times a week    Frequency of Social Gatherings with Friends and Family: Once a week    Attends Religious Services: More than 4 times per year    Active Member of [employer] or Organizations: No    Attends Banker Meetings: Never    Marital Status: Living with partner    Allergies  Allergen Reactions   Ace Inhibitors Cough   Jardiance  [Empagliflozin ] Rash    Yeast infection/rash/UTI    Outpatient Medications Prior to Visit  Medication Sig Dispense Refill   acetaminophen  (TYLENOL ) 500 MG tablet Take 1,000 mg by mouth as needed for mild pain (pain score 1-3) or moderate pain (pain score 4-6).     albuterol  (PROVENTIL ) (2.5 MG/3ML) 0.083% nebulizer solution Take 3 mLs (2.5 mg total) by nebulization every 6 (six) hours as needed for wheezing or shortness of breath. 75 mL 12   amoxicillin -clavulanate (AUGMENTIN ) 875-125 MG tablet Take 1 tablet by mouth every 12 (twelve) hours. (Patient not taking: Reported on 02/21/2024) 14 tablet 0   aspirin  81 MG chewable tablet Chew 1 tablet (81 mg total) by mouth daily. 30 tablet 1   atorvastatin  (LIPITOR) 40  MG tablet Take 1 tablet (40 mg total) by mouth daily. 90 tablet 1   FLUoxetine  (PROZAC ) 20 MG capsule Take 1 capsule (20 mg total) by mouth daily. 90 capsule 1   fluticasone  (FLONASE ) 50 MCG/ACT nasal spray Place 2 sprays into both nostrils daily. 9.9 mL 2   HYDROcodone  bit-homatropine (HYCODAN) 5-1.5 MG/5ML syrup Take 5 mLs by mouth every 6 (six) hours as needed for cough. (Patient not taking: Reported on 02/21/2024) 120 mL 0   hydrOXYzine  (ATARAX ) 25 MG tablet Take 1 tablet (25 mg total) by mouth at bedtime as needed. 30 tablet 1   ketoconazole  (NIZORAL ) 2 % shampoo Apply 1 Application topically 2 (two) times a week. 120 mL 1   methimazole  (TAPAZOLE ) 5 MG tablet Take 1 tablet (5 mg total) by mouth 2 (two) times daily. 180 tablet 1   metoprolol  succinate (TOPROL -XL) 50 MG 24 hr tablet Take 1.5 tablets (75 mg total) by mouth daily. Take with or immediately following a meal. 135 tablet 1   Misc. Devices MISC Blood Pressure Monitor 1 each 0   polyethylene glycol powder (GLYCOLAX /MIRALAX ) 17 GM/SCOOP powder Take 17 g by mouth daily. 3350 g 1   polyethylene glycol powder (GLYCOLAX /MIRALAX ) 17 GM/SCOOP powder Dissolve 17 grams into water and drink by mouth every day. (Patient not taking: Reported on 01/27/2024) 3570 g 1   potassium chloride  SA (KLOR-CON  M20) 20 MEQ tablet Take 3 tablets (60 mEq total) by mouth daily. 180 tablet 1   sacubitril -valsartan  (ENTRESTO ) 49-51 MG Take 1 tablet by mouth 2 (two) times daily. 90 tablet 3   Semaglutide , 1 MG/DOSE, 4 MG/3ML SOPN Inject 1 mg as directed once a week. 3 mL 3   Semaglutide ,0.25 or 0.5MG /DOS, (OZEMPIC , 0.25 OR 0.5 MG/DOSE,) 2 MG/3ML SOPN Inject 0.25 mg into the skin once a week. For 4 weeks then increase to 0.5mg  (Patient taking differently: Inject 0.25 mg into the skin every Wednesday. For 4 weeks then increase to 0.5mg ) 3 mL 0   Semaglutide ,0.25 or 0.5MG /DOS, (OZEMPIC , 0.25 OR 0.5 MG/DOSE,) 2 MG/3ML SOPN Inject 0.5 mg into the skin once a week. For 4 weeks  then increase to 1mg  3 mL 0   spironolactone  (  ALDACTONE ) 25 MG tablet Take 1 tablet (25 mg total) by mouth daily. 90 tablet 1   torsemide  (DEMADEX ) 20 MG tablet Take 1 tablet (20 mg total) by mouth daily. 90 tablet 1   triamcinolone  cream (KENALOG ) 0.1 % Apply 1 Application topically 2 (two) times daily. (Patient taking differently: Apply 1 Application topically 2 (two) times daily as needed (skin irritation.).) 30 g 0   Vitamin D , Ergocalciferol , (DRISDOL ) 1.25 MG (50000 UNIT) CAPS capsule Take 1 capsule (50,000 Units total) by mouth every 7 (seven) days. 12 capsule 1   albuterol  (VENTOLIN  HFA) 108 (90 Base) MCG/ACT inhaler Inhale 2 puffs into the lungs every 4 (four) hours as needed for wheezing or shortness of breath. 18 g 0   methylPREDNISolone  (MEDROL  DOSEPAK) 4 MG TBPK tablet Take as directed on package (Patient not taking: Reported on 02/21/2024) 21 each 0   No facility-administered medications prior to visit.     ROS Review of Systems  Constitutional:  Negative for activity change and appetite change.  HENT:  Negative for sinus pressure and sore throat.   Respiratory:  Positive for cough. Negative for chest tightness, shortness of breath and wheezing.   Cardiovascular:  Negative for chest pain and palpitations.  Gastrointestinal:  Negative for abdominal distention, abdominal pain and constipation.  Genitourinary: Negative.   Musculoskeletal: Negative.   Psychiatric/Behavioral:  Negative for behavioral problems and dysphoric mood.     Objective:  BP 116/75   Pulse 80   Ht 5' 8 (1.727 m)   Wt 218 lb 3.2 oz (99 kg)   SpO2 97%   BMI 33.18 kg/m      03/09/2024    8:47 AM 03/07/2024   11:19 AM 02/21/2024   12:51 PM  BP/Weight  Systolic BP 116  892  Diastolic BP 75  75  Wt. (Lbs) 218.2 205   BMI 33.18 kg/m2 31.17 kg/m2     Wt Readings from Last 3 Encounters:  03/09/24 218 lb 3.2 oz (99 kg)  03/07/24 205 lb (93 kg)  01/30/24 206 lb (93.4 kg)     Physical  Exam Constitutional:      Appearance: She is well-developed.  Cardiovascular:     Rate and Rhythm: Normal rate.     Heart sounds: Normal heart sounds. No murmur heard. Pulmonary:     Effort: Pulmonary effort is normal.     Breath sounds: Normal breath sounds. No wheezing or rales.  Chest:     Chest wall: No tenderness.  Abdominal:     General: Bowel sounds are normal. There is no distension.     Palpations: Abdomen is soft. There is no mass.     Tenderness: There is no abdominal tenderness.  Musculoskeletal:        General: Normal range of motion.     Right lower leg: No edema.     Left lower leg: No edema.  Neurological:     Mental Status: She is alert and oriented to person, place, and time.  Psychiatric:        Mood and Affect: Mood normal.        Latest Ref Rng & Units 02/01/2024    6:57 AM 02/01/2024    6:54 AM 01/30/2024    1:09 PM  CMP  Glucose 70 - 99 mg/dL 856  857  94   BUN 6 - 20 mg/dL 7  8  11    Creatinine 0.44 - 1.00 mg/dL 9.29  9.27  9.27   Sodium 135 -  145 mmol/L 140  138  139   Potassium 3.5 - 5.1 mmol/L 3.6  3.7  3.0   Chloride 98 - 111 mmol/L 106  105  104   CO2 22 - 32 mmol/L  21  23   Calcium  8.9 - 10.3 mg/dL  9.0  8.7     Lipid Panel     Component Value Date/Time   CHOL 121 07/06/2023 1149   TRIG 68 07/06/2023 1149   HDL 52 07/06/2023 1149   CHOLHDL 2.3 07/06/2023 1149   CHOLHDL 3.5 04/12/2023 0727   VLDL 12 04/12/2023 0727   LDLCALC 55 07/06/2023 1149    CBC    Component Value Date/Time   WBC 7.5 02/01/2024 0654   RBC 5.10 02/01/2024 0654   HGB 15.6 (H) 02/01/2024 0657   HGB 14.4 01/20/2024 1555   HCT 46.0 02/01/2024 0657   HCT 44.4 01/20/2024 1555   PLT 196 02/01/2024 0654   PLT 235 01/20/2024 1555   MCV 89.0 02/01/2024 0654   MCV 90 01/20/2024 1555   MCH 29.0 02/01/2024 0654   MCHC 32.6 02/01/2024 0654   RDW 13.2 02/01/2024 0654   RDW 12.8 01/20/2024 1555   LYMPHSABS 2.1 07/06/2023 1149   MONOABS 0.4 04/11/2023 1351   EOSABS  0.0 07/06/2023 1149   BASOSABS 0.0 07/06/2023 1149    Lab Results  Component Value Date   HGBA1C 7.4 (A) 01/04/2024   Lab Results  Component Value Date   TSH 0.937 01/04/2024       Assessment and Plan Assessment & Plan Bronchitis With chronic Cough Persistent cough post-bronchitis with productive mucus. Differential includes lingering bronchitis or pneumonia. Previous prednisone  caused side effects including weight gain; lower dose planned. Azithromycin  chosen as alternative antibiotic. -Last month she completed a course of Augmentin  and Medrol  Dosepak - Order chest x-ray to rule out pneumonia. - Prescribe azithromycin  (Z-Pak). - Order complete blood count. - Prescribe prednisone  5 mg daily for 5 days. - Prescribe Tessalon  Perles if covered by Medicaid. - Refill albuterol  inhaler. - Prescribe loratadine 10 mg daily.  Hypertensive heart disease with combined chronic diastolic and systolic CHF EF of 35% status post ICD in 01/2024 Recent weight gain possibly due to fluid retention versus prednisone  use. Plan to assess fluid status before adjusting diuretics. - Monitor for signs of fluid retention. - Consider adjusting diuretic therapy based on fluid status assessment. - BNP ordered  Type 2 Diabetes Mellitus A1c at 7.4% indicates good control. Weight gain noted, possibly due to inactivity and prednisone . Increasing semaglutide  dose expected to aid weight loss. - Continue semaglutide  (Ozempic ) with dose escalation to 1 mg. - Monitor weight and adjust treatment if necessary. - Check A1c at next visit.  Hyperthyroidism Thyroid  function tests normal in August. Endocrinologist follow-up pending. - Continue methimazole    General Health Maintenance and screening for colon cancer  Due for routine screenings. Previous illness and logistical issues delayed completion. - Order colonoscopy for colon cancer screening. - Encourage scheduling mammogram for breast cancer screening  -order was placed at last office visit  Follow-up Upcoming appointments with cardiologist and neuro rehab doctor. Coordination with case manager needed for Medicaid assistance. - Follow up on chest x-ray and blood test results. - Coordinate with case manager for Medicaid assistance program and completion of FL 2 form       Meds ordered this encounter  Medications   azithromycin  (ZITHROMAX ) 250 MG tablet    Sig: Take 2 tablets on day 1, then 1 tablet daily  on days 2 through 5    Dispense:  6 tablet    Refill:  0   predniSONE  (DELTASONE ) 5 MG tablet    Sig: Take 4 tablets (20 mg total) by mouth daily with breakfast.    Dispense:  5 tablet    Refill:  0   benzonatate  (TESSALON ) 200 MG capsule    Sig: Take 1 capsule (200 mg total) by mouth 2 (two) times daily as needed for cough.    Dispense:  20 capsule    Refill:  0   albuterol  (VENTOLIN  HFA) 108 (90 Base) MCG/ACT inhaler    Sig: Inhale 2 puffs into the lungs every 4 (four) hours as needed for wheezing or shortness of breath.    Dispense:  18 g    Refill:  1   loratadine (CLARITIN) 10 MG tablet    Sig: Take 1 tablet (10 mg total) by mouth daily.    Dispense:  30 tablet    Refill:  11    Follow-up: Return for previously scheduled appointment.       Corrina Sabin, MD, FAAFP. [facility] [place], [place] [phone]   03/09/2024, 9:26 AM

## 2024-03-09 NOTE — Patient Instructions (Signed)
 VISIT SUMMARY:  During today's visit, we discussed your persistent cough, recent weight gain, and overall health management. We reviewed your current medications and made adjustments to better manage your symptoms and conditions. We also planned for some follow-up tests and appointments to ensure comprehensive care.  YOUR PLAN:  -CHRONIC COUGH: Your persistent cough may be due to lingering bronchitis or pneumonia. We will perform a chest x-ray to check for pneumonia and have prescribed azithromycin  (Z-Pak) as an antibiotic. You will also take prednisone  5 mg daily for 5 days to reduce inflammation. If covered by Medicaid, you will use Tessalon  Perles for cough relief. Continue using your albuterol  inhaler and take loratadine 10 mg daily for allergies.  -NONISCHEMIC CARDIOMYOPATHY: This condition affects the heart's ability to pump blood effectively. We will monitor you for signs of fluid retention and may adjust your diuretic therapy based on this assessment.  -TYPE 2 DIABETES MELLITUS: Your blood sugar levels are fairly well controlled with an A1c of 7.4%. We will increase your dose of semaglutide  (Ozempic ) to 1 mg to help with weight loss. Please monitor your weight and we will check your A1c at your next visit.  -HYPERTHYROIDISM: Your thyroid  function was normal in August, but you need to follow up with an endocrinologist. Please schedule this appointment as soon as possible.  -GENERAL HEALTH MAINTENANCE: You are due for routine screenings. We will order a colonoscopy for colon cancer screening and encourage you to schedule a mammogram for breast cancer screening.  INSTRUCTIONS:  Please follow up on the chest x-ray and blood test results as soon as they are available. Coordinate with your case manager for Medicaid assistance. If your current issues are resolved, consider canceling your November follow-up appointment. Continue with your upcoming appointments with the cardiologist and neuro rehab  doctor.

## 2024-03-11 LAB — BRAIN NATRIURETIC PEPTIDE: BNP: 76 pg/mL (ref 0.0–100.0)

## 2024-03-11 LAB — CMP14+EGFR
ALT: 34 IU/L — ABNORMAL HIGH (ref 0–32)
AST: 19 IU/L (ref 0–40)
Albumin: 3.8 g/dL (ref 3.8–4.9)
Alkaline Phosphatase: 117 IU/L (ref 49–135)
BUN/Creatinine Ratio: 15 (ref 9–23)
BUN: 12 mg/dL (ref 6–24)
Bilirubin Total: 0.5 mg/dL (ref 0.0–1.2)
CO2: 24 mmol/L (ref 20–29)
Calcium: 9.3 mg/dL (ref 8.7–10.2)
Chloride: 104 mmol/L (ref 96–106)
Creatinine, Ser: 0.81 mg/dL (ref 0.57–1.00)
Globulin, Total: 2.6 g/dL (ref 1.5–4.5)
Glucose: 81 mg/dL (ref 70–99)
Potassium: 3.6 mmol/L (ref 3.5–5.2)
Sodium: 141 mmol/L (ref 134–144)
Total Protein: 6.4 g/dL (ref 6.0–8.5)
eGFR: 84 mL/min/1.73 (ref >59)

## 2024-03-11 LAB — CBC WITH DIFFERENTIAL/PLATELET
Basophils Absolute: 0 x10E3/uL (ref 0.0–0.2)
Basos: 0 %
EOS (ABSOLUTE): 0.1 x10E3/uL (ref 0.0–0.4)
Eos: 3 %
Hematocrit: 43.6 % (ref 34.0–46.6)
Hemoglobin: 13.7 g/dL (ref 11.1–15.9)
Immature Grans (Abs): 0 x10E3/uL (ref 0.0–0.1)
Immature Granulocytes: 0 %
Lymphocytes Absolute: 2.2 x10E3/uL (ref 0.7–3.1)
Lymphs: 43 %
MCH: 28.8 pg (ref 26.6–33.0)
MCHC: 31.4 g/dL — ABNORMAL LOW (ref 31.5–35.7)
MCV: 92 fL (ref 79–97)
Monocytes Absolute: 0.5 x10E3/uL (ref 0.1–0.9)
Monocytes: 9 %
Neutrophils Absolute: 2.3 x10E3/uL (ref 1.4–7.0)
Neutrophils: 45 %
Platelets: 203 x10E3/uL (ref 150–450)
RBC: 4.75 x10E6/uL (ref 3.77–5.28)
RDW: 12.3 % (ref 11.7–15.4)
WBC: 5.2 x10E3/uL (ref 3.4–10.8)

## 2024-03-12 ENCOUNTER — Ambulatory Visit: Payer: Self-pay | Admitting: Family Medicine

## 2024-03-13 ENCOUNTER — Encounter (HOSPITAL_COMMUNITY): Admitting: Cardiology

## 2024-03-13 ENCOUNTER — Ambulatory Visit

## 2024-03-13 DIAGNOSIS — I5042 Chronic combined systolic (congestive) and diastolic (congestive) heart failure: Secondary | ICD-10-CM | POA: Diagnosis not present

## 2024-03-13 NOTE — Progress Notes (Incomplete)
   ADVANCED HEART FAILURE NEW PATIENT CLINIC NOTE  Referring Physician: Delbert Clam, MD  Primary Care: Delbert Clam, MD Primary Cardiologist:  HPI: Chelsea Branch is a 58 y.o. female with a PMH of *** who presents for initial visit for further evaluation and treatment of heart failure/cardiomyopathy.      {Anything typed between these two boxes will persist and can be pulled forward to future notes. This phrase will delete itself when the note is signed :1}      SUBJECTIVE:   PMH, current medications, allergies, social history, and family history reviewed in epic.  PHYSICAL EXAM: There were no vitals filed for this visit. GENERAL: Well nourished and in no apparent distress at rest.  PULM:  Normal work of breathing, clear to auscultation bilaterally. Respirations are unlabored.  CARDIAC:  JVP: ***         Normal rate with regular rhythm. No murmurs, rubs or gallops.  *** edema. Warm and well perfused extremities. ABDOMEN: Soft, non-tender, non-distended. NEUROLOGIC: Patient is oriented x3 with no focal or lateralizing neurologic deficits.    DATA REVIEW  ECG: ***    ECHO: ***  CATH: ***    Heart failure review: - Classification: {HFCLASS:30917} - Etiology: {Cardiomyopathy:30918} - NYHA Class:  - Volume status: {volumechf:30919} - ACEi/ARB/ARNI: {HF:30752} - Aldosterone antagonist: {HF:30752} - Beta-blocker: {HF:30752} - Digoxin: {HF:30752} - Hydralazine /Nitrates: {HF:30752} - SGLT2i: {HF:30752} - GLP-1: {GLP:30906} - Advanced therapies: {Advancedtherapies:30916} - ICD: {ICD:30901}   ASSESSMENT & PLAN:  ***  Follow up in ***  [name], MD Advanced Heart Failure Mechanical Circulatory Support 03/13/24

## 2024-03-14 ENCOUNTER — Ambulatory Visit: Payer: Medicaid Other | Admitting: Adult Health

## 2024-03-14 ENCOUNTER — Encounter: Admitting: Speech Pathology

## 2024-03-15 ENCOUNTER — Telehealth: Payer: Self-pay

## 2024-03-15 LAB — CUP PACEART REMOTE DEVICE CHECK
Battery Remaining Longevity: 180 mo
Battery Remaining Percentage: 100 %
Brady Statistic RV Percent Paced: 0 %
Date Time Interrogation Session: 20251021005100
HighPow Impedance: 71 Ohm
Implantable Lead Connection Status: 753985
Implantable Lead Implant Date: 20250908
Implantable Lead Location: 753860
Implantable Lead Model: 673
Implantable Lead Serial Number: 271980
Implantable Pulse Generator Implant Date: 20250908
Lead Channel Impedance Value: 457 Ohm
Lead Channel Pacing Threshold Amplitude: 0.6 V
Lead Channel Pacing Threshold Pulse Width: 0.4 ms
Lead Channel Setting Pacing Amplitude: 3.5 V
Lead Channel Setting Pacing Pulse Width: 0.4 ms
Lead Channel Setting Sensing Sensitivity: 0.5 mV
Pulse Gen Serial Number: 354500
Zone Setting Status: 755011

## 2024-03-15 NOTE — Telephone Encounter (Addendum)
 Patient dropped adult Care home FL2 Form. Spoke with [place] , SW at [employer] . Form is for patient to receive additional Help at home. Form completed and given to PCP for signature.

## 2024-03-16 NOTE — Progress Notes (Signed)
 Remote ICD Transmission

## 2024-03-17 ENCOUNTER — Ambulatory Visit: Payer: Self-pay | Admitting: Cardiology

## 2024-03-19 ENCOUNTER — Encounter (HOSPITAL_COMMUNITY): Payer: Self-pay | Admitting: Cardiology

## 2024-03-19 NOTE — Telephone Encounter (Signed)
 [place], SW with DSS. Received FL2 for patient .

## 2024-03-20 NOTE — Telephone Encounter (Signed)
 noted

## 2024-03-21 ENCOUNTER — Ambulatory Visit: Admitting: Speech Pathology

## 2024-03-21 ENCOUNTER — Encounter: Payer: Self-pay | Admitting: Speech Pathology

## 2024-03-21 DIAGNOSIS — R4701 Aphasia: Secondary | ICD-10-CM | POA: Diagnosis not present

## 2024-03-21 DIAGNOSIS — R471 Dysarthria and anarthria: Secondary | ICD-10-CM

## 2024-03-21 NOTE — Therapy (Signed)
 OUTPATIENT SPEECH LANGUAGE PATHOLOGY TREATMENT & RECERT   Patient Name: Chelsea Branch MRN: [id number] DOB:[birth year], 58 y.o., female Today's Date: 03/21/2024  PCP: Delbert Clam, MD REFERRING PROVIDER: Delbert Clam, MD  END OF SESSION:  End of Session - 03/21/24 1221     Visit Number 4    Number of Visits 7    Date for Recertification  05/02/24    Authorization Type [employer] - no auth required    Authorization Time Period 19    Authorization - Visit Number 27    SLP Start Time 1230    SLP Stop Time  1315    SLP Time Calculation (min) 45 min    Activity Tolerance Patient tolerated treatment well          Past Medical History:  Diagnosis Date   Abnormal thyroid  blood test 03/15/2017   Abnormal uterine bleeding    Alopecia    Anemia    Angina pectoris with normal coronary arteriogram 2017   Had + Troponin c/w ? 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 iniatially ?2/2 peripartum in 1994 - improved by 2008 then worsening EF in 2011 back down to EF 25-30%. b. echo 01/21/14 showed mod LVH, [name]; 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   Peripartum cardiomyopathy 1994   Sleep apnea 2015   CPAP 12/2013   Stroke (HCC) 2011   Systolic and diastolic CHF, acute on chronic (HCC) 06/14/2015   Tobacco abuse    Type 2 diabetes mellitus (HCC) 04/28/2023   Past Surgical History:  Procedure Laterality Date   CARDIAC CATHETERIZATION N/A 06/16/2015   Procedure: Left Heart Cath and Coronary  Angiography;  Surgeon: Candyce GORMAN Reek, MD;  Location: [facility] INVASIVE CV LAB;  Service: Cardiovascular;  Laterality: N/A;   CESAREAN SECTION  1992  1994   ICD IMPLANT N/A 01/30/2024   Procedure: ICD IMPLANT;  Surgeon: Kennyth Chew, MD;  Location: [facility] INVASIVE CV LAB;  Service: Cardiovascular;  Laterality: N/A;   INTRAUTERINE DEVICE (IUD) INSERTION     about 3 years ago   LOOP RECORDER IMPLANT  ~ 2000   spine injections      [facility],  Dr Darlis   TIBIAL TUBERCLERPLASTY     TRANSESOPHAGEAL ECHOCARDIOGRAM (CATH LAB) N/A 04/20/2023   Procedure: TRANSESOPHAGEAL ECHOCARDIOGRAM;  Surgeon: Alvan Ronal BRAVO, MD;  Location: [facility] INVASIVE CV LAB;  Service: Cardiovascular;  Laterality: N/A;   TUBAL LIGATION  05/24/1992   Patient Active Problem List   Diagnosis Date Noted   Dependent edema 02/21/2024   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   History of stroke with residual effects 07/06/2023   Frequent falls 07/06/2023   Type 2 diabetes mellitus (HCC) 04/28/2023   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) 04/28/2023   History of cerebellar stroke 04/13/2023   History of stroke 04/11/2023   Liletta  IUD (intrauterine device) in place since 12/15/2015 04/06/2023   Chronic pain syndrome 12/28/2021   Primary osteoarthritis of both  knees 12/28/2021   Spinal stenosis of lumbar region 07/21/2021   Lumbar radiculitis 07/21/2021   COPD (chronic obstructive pulmonary disease) (HCC) 07/21/2021   Mechanical complication due to intrauterine contraceptive device 07/21/2021   Arthropathy of lumbar facet joint 03/14/2019   Degenerative disc disease at L5-S1 level 10/10/2018   Hyperthyroidism 04/26/2016   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   History of non-ST elevation myocardial infarction (NSTEMI) 06/14/2015   Homelessness 09/16/2014   Vitamin D  deficiency 01/29/2014   OSA (obstructive sleep apnea) 01/08/2014   Chronic combined systolic and diastolic CHF  (congestive heart failure) (HCC) 08/14/2013   Essential hypertension    Fibroids 08/09/2012   Menorrhagia 05/08/2012   Female pattern hair loss    HLD (hyperlipidemia) 06/08/2010   CEREBRAL ANEURYSM 06/08/2010   History of cardiovascular disorder 06/08/2010    ONSET DATE: 10/24/2023 (referral date)   REFERRING DIAG: I69.30 (ICD-10-CM) - History of stroke with residual effects  THERAPY DIAG:  Aphasia  Dysarthria and anarthria  Rationale for Evaluation and Treatment: Rehabilitation  SUBJECTIVE:   SUBJECTIVE STATEMENT: It's about the same, but a little better Pt accompanied by: self  PERTINENT HISTORY: Chelsea Branch is a 58 yo female presenting to ED 11/18 with expressive aphasia, R UE weakness, and confusion. MRI Brain with acute infarcts in the acute occipital lobe, posterior aspect of L insula, and L cerebellar hemisphere. TNK administered. PMH includes prior CVA (2011), OSA, tobacco use, chronic combined CHF, HTN, NSTEMI (2017), NIDCM   PAIN: Are you having pain? No  FALLS: Has patient fallen in last 6 months?  No   The Communicative Participation Item Bank        Does your condition interfere with... Pt Rating   ...talking with people you know 2   ...communicating when you need to say something quickly 2   ...talking with people you do not know 3   ...communicating when you are out in your community 2   ...asking questions in a conversation 1   ....communicating in a small group of people 2   ...having a long conversation 2   ...giving detailed infomrmation 2   ...getting your turn in a fast moving conversation 2   ...trying to persuade a friend or family member to see a different point of view 2  3= Not at all; 2=A little; 1=Quite a bit; 0=Very much                                                                                                                             TREATMENT DATE:   03/21/24: Chelsea Branch reports word finding difficulties several times a week -   To target word finding  Scientist, Product/process Development (VNeST) was utilized. The pt generated 3 subjects and objects for 3 verbs (deliver, decorate, measure) for a total of 9 subject objects. Pt required rare min A cues. Pt generated 3 complex sentences by answering wh questions. Pt required mod I cues to generate  complex sentences. Targeted moderately complex word finding naming words with given 1st letter with occasional min semantic cues she named 24 words. In conversation she compensated for 1/1 word finding episodes with rare min A   02/29/24: Targeted verbal compensations for word finding in structured task generating 2-3 salient descriptions of objects/words. Orphia required occasional min to mod questioning cues to generate most salient descriptions (ex: camera she said take pictures - mod A to verbalize how a camera was different from a phone) Generated 3 similarities and differences for 3 sets of related words to train verbal compensations for word finding. In conversation, she required verbal cues describe it to me to use compensations 2/4 opportunities.  02/22/24: Arriona reports some improvement in speech as her speech is clear today - she continues to have to make effort to use slow rate and over articulation to achieve clear, intelligible speech. Targeted word finding in structured complex naming task with given letter - Chelsea Branch named 20/24 words with occasional min semantic and phonemic cues. Targeted verbal compensations for word finding in structured task describing objects - Chelan required questioning cues and semantic cues to generate the most salient descriptions and to differentiate from a similar object (ex: paper clip vs tape). In conversation, 1 word finding episode which required questioning cues to carryover verbal compensations.   12/20/23: evaluation only d/t payor source requirement   PATIENT EDUCATION: Education details: POC, syllable segmentation, slow rate  Person  educated: Patient Education method: Explanation Education comprehension: verbalized understanding and needs further education   GOALS: Goals reviewed with patient? Yes  LONG TERM GOALS: Target date: 05/02/24 (recert 03/21/24) Pt will use verbal compensations for word finding in conversation with rare min A  Baseline: limited compensation for word finding  Goal status: ONGOING  2.  Pt will complete moderately complex structured language task (discourse level) with 90% accuracy to aid in language rehabilitation  Baseline: mild aphasia Goal status:ONGOING  3.  Pt will utilize processing compensations during 15+ minute conversation in 80% of opportunities, evidenced by appropriate responses/comments  Baseline: inconsistent use of compensations Goal status:ONGOING  4.  Pt will report improved communication effectiveness via PROM by 2 points by LTG date Baseline: CPIB=20 Goal status: ONGOING   ASSESSMENT:  CLINICAL IMPRESSION: Patient is a 58 y.o. F who was seen today for ST evaluation for residual aphasia. Completed several ST sessions earlier in year but did not complete POC for unknown reason. Today, pt presents with ongoing mild aphasia and mild dysarthria c/b intermittent anomia, semantic/phonemic paraphasias, and vague speech. Occasional imprecise articulation evident. Impaired auditory comprehension also intermittently observed, with occasional compensations utilized. Pt aware of speech changes, which reportedly impact daily communication needs and social engagement. Pt would benefit from additional ST services to optimize communication effectiveness. She has attended 3 of the 6 recommended ST sessions due to scheduling. Completed re-cert today to extend date in order to complete future ST sessions. Continue all goals and POC.   OBJECTIVE IMPAIRMENTS: include aphasia. These impairments are limiting patient from effectively communicating at home and in community. Factors affecting  potential to achieve goals and functional outcome are financial resources and family/community support. Patient will benefit from skilled SLP services to address above impairments and improve overall function.  REHAB POTENTIAL: Good  PLAN:  SLP FREQUENCY: 1x/week  SLP DURATION: 12 weeks (extended for scheduling)  PLANNED INTERVENTIONS: Language facilitation, Cueing hierachy, Internal/external aids, Functional tasks, Multimodal communication approach, SLP instruction and feedback, Compensatory strategies, Patient/family education, and 07492 Treatment of  speech (30 or 45 min)   Check all possible CPT codes: 07492 - SLP treatment    Check all conditions that are expected to impact treatment: Social determinants of health   If treatment provided at initial evaluation, no treatment charged due to lack of authorization.    Cha Gomillion, Leita Caldron, CCC-SLP 03/21/2024, 1:15 PM

## 2024-03-27 ENCOUNTER — Other Ambulatory Visit: Payer: Self-pay

## 2024-03-27 ENCOUNTER — Encounter: Payer: Self-pay | Admitting: Adult Health

## 2024-03-27 ENCOUNTER — Ambulatory Visit: Admitting: Adult Health

## 2024-03-27 ENCOUNTER — Other Ambulatory Visit: Payer: Self-pay | Admitting: Family Medicine

## 2024-03-27 ENCOUNTER — Other Ambulatory Visit: Payer: Self-pay | Admitting: Internal Medicine

## 2024-03-27 DIAGNOSIS — I5032 Chronic diastolic (congestive) heart failure: Secondary | ICD-10-CM

## 2024-03-27 MED ORDER — SPIRONOLACTONE 25 MG PO TABS
25.0000 mg | ORAL_TABLET | Freq: Every day | ORAL | 1 refills | Status: DC
  Filled 2024-03-27: qty 90, 90d supply, fill #0

## 2024-03-27 MED ORDER — METOPROLOL SUCCINATE ER 50 MG PO TB24
75.0000 mg | ORAL_TABLET | Freq: Every day | ORAL | 1 refills | Status: DC
  Filled 2024-03-27 – 2024-04-11 (×2): qty 135, 90d supply, fill #0

## 2024-03-27 NOTE — Progress Notes (Signed)
 Pt attended 01/06/24 screening event where her blood pressure was 122/87. Pt documented that she does have a PCP, is not a smoker, pt did not list insurance and noted SDOH needs for food, transportation and housing.  Chart review indicates that pt does have a PCP listed as Newlin,Enobong MD and is insured through [facility] Medicaid (managed). Pt had a recent office visit with her PCP on 03/09/24 and has a future scheduled appt with her PCP on 04/11/24, pt also has other specialist appts up and coming.  CHW called pt to follow up on SDOH needs. Pt stated that that she currently lives with her mother and is on disability due to having 2 strokes. Pt stated that her only source of income is her disability check and it is not enough to pay for her own place and government assistance programs waiting list are too long. Pt is on SNAP but stated that she does not get enough to last her a full month. Pt does not want to go to food banks because she does not feel the food selections are healthy as she does not like to eat canned foods. Pt applied for a medicaid assistance program that if approved, will give her supplemental income. Pt also noted she is actively working with a Child Psychotherapist that she thinks is named Insurance Claims Handler through her PCP office.  As pt is currently working with a CSW at this time, No future follow up to be scheduled per [facility] protocol.

## 2024-03-28 ENCOUNTER — Ambulatory Visit: Attending: Critical Care Medicine | Admitting: Speech Pathology

## 2024-03-28 ENCOUNTER — Ambulatory Visit: Payer: Self-pay | Admitting: Student

## 2024-03-28 DIAGNOSIS — R4701 Aphasia: Secondary | ICD-10-CM | POA: Diagnosis present

## 2024-03-28 NOTE — Patient Instructions (Signed)
   Energy conservation - you have more fatigue after a stroke and don't have the energy to do all the things you did before  You can't see your brain injury - it is invisible and you look good, so you may have to remind your family and friends that you had a stroke and are still healing  Think each week of your priorities and spend your energy on what is important  If you have something important at night rest during the day  Your brain is healing - rest is [employer] [place] on Wheels

## 2024-03-28 NOTE — Therapy (Signed)
 OUTPATIENT SPEECH LANGUAGE PATHOLOGY TREATMENT & DISCHARGE SUMMARY   Patient Name: Chelsea Branch MRN: [id number] DOB:[birth year], 58 y.o., female Today's Date: 04/03/2024  PCP: Delbert Clam, MD REFERRING PROVIDER: Delbert Clam, MD  END OF SESSION:  End of Session - 04/03/24 1431     Visit Number 5    Number of Visits 7    Date for Recertification  05/02/24    Authorization Type [employer] Medicaid - no auth required    Authorization Time Period 20    Authorization - Visit Number 27    SLP Start Time 1230    SLP Stop Time  1315    SLP Time Calculation (min) 45 min    Activity Tolerance Patient tolerated treatment well           Past Medical History:  Diagnosis Date   Abnormal thyroid  blood test 03/15/2017   Abnormal uterine bleeding    Alopecia    Anemia    Angina pectoris with normal coronary arteriogram 2017   Had + Troponin c/w ? 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 iniatially ?2/2 peripartum in 1994 - improved by 2008 then worsening EF in 2011 back down to EF 25-30%. b. echo 01/21/14 showed mod LVH, [name]; 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   Peripartum cardiomyopathy 1994   Sleep apnea 2015   CPAP 12/2013   Stroke (HCC) 2011   Systolic and diastolic CHF, acute on chronic (HCC) 06/14/2015   Tobacco abuse    Type 2 diabetes mellitus (HCC) 04/28/2023   Past Surgical History:  Procedure Laterality Date   CARDIAC CATHETERIZATION N/A 06/16/2015   Procedure: Left Heart Cath and  Coronary Angiography;  Surgeon: Candyce GORMAN Reek, MD;  Location: [facility] INVASIVE CV LAB;  Service: Cardiovascular;  Laterality: N/A;   CESAREAN SECTION  1992  1994   ICD IMPLANT N/A 01/30/2024   Procedure: ICD IMPLANT;  Surgeon: Kennyth Chew, MD;  Location: [facility] INVASIVE CV LAB;  Service: Cardiovascular;  Laterality: N/A;   INTRAUTERINE DEVICE (IUD) INSERTION     about 3 years ago   LOOP RECORDER IMPLANT  ~ 2000   spine injections      [facility],  Dr Darlis   TIBIAL TUBERCLERPLASTY     TRANSESOPHAGEAL ECHOCARDIOGRAM (CATH LAB) N/A 04/20/2023   Procedure: TRANSESOPHAGEAL ECHOCARDIOGRAM;  Surgeon: Alvan Ronal BRAVO, MD;  Location: [facility] INVASIVE CV LAB;  Service: Cardiovascular;  Laterality: N/A;   TUBAL LIGATION  05/24/1992   Patient Active Problem List   Diagnosis Date Noted   Dependent edema 02/21/2024   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   History of stroke with residual effects 07/06/2023   Frequent falls 07/06/2023   Type 2 diabetes mellitus (HCC) 04/28/2023   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) 04/28/2023   History of cerebellar stroke 04/13/2023   History of stroke 04/11/2023   Liletta  IUD (intrauterine device) in place since 12/15/2015 04/06/2023   Chronic pain syndrome 12/28/2021   Primary osteoarthritis  of both knees 12/28/2021   Spinal stenosis of lumbar region 07/21/2021   Lumbar radiculitis 07/21/2021   COPD (chronic obstructive pulmonary disease) (HCC) 07/21/2021   Mechanical complication due to intrauterine contraceptive device 07/21/2021   Arthropathy of lumbar facet joint 03/14/2019   Degenerative disc disease at L5-S1 level 10/10/2018   Hyperthyroidism 04/26/2016   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   History of non-ST elevation myocardial infarction (NSTEMI) 06/14/2015   Homelessness 09/16/2014   Vitamin D  deficiency 01/29/2014   OSA (obstructive sleep apnea) 01/08/2014   Chronic combined systolic and  diastolic CHF (congestive heart failure) (HCC) 08/14/2013   Essential hypertension    Fibroids 08/09/2012   Menorrhagia 05/08/2012   Female pattern hair loss    HLD (hyperlipidemia) 06/08/2010   CEREBRAL ANEURYSM 06/08/2010   History of cardiovascular disorder 06/08/2010    ONSET DATE: 10/24/2023 (referral date)   REFERRING DIAG: I69.30 (ICD-10-CM) - History of stroke with residual effects  THERAPY DIAG:  Aphasia  Rationale for Evaluation and Treatment: Rehabilitation  SUBJECTIVE:   SUBJECTIVE STATEMENT: It's about the same, but a little better Pt accompanied by: self  PERTINENT HISTORY: Chelsea Branch is a 58 yo female presenting to ED 11/18 with expressive aphasia, R UE weakness, and confusion. MRI Brain with acute infarcts in the acute occipital lobe, posterior aspect of L insula, and L cerebellar hemisphere. TNK administered. PMH includes prior CVA (2011), OSA, tobacco use, chronic combined CHF, HTN, NSTEMI (2017), NIDCM   PAIN: Are you having pain? No  FALLS: Has patient fallen in last 6 months?  No   The Communicative Participation Item Bank        Does your condition interfere with... Pt Rating   ...talking with people you know 2 2   ...communicating when you need to say something quickly 2  2.5   ...talking with people you do not know 3  3   ...communicating when you are out in your community 2   2   ...asking questions in a conversation 1   2   ....communicating in a small group of people 2   3   ...having a long conversation 2   2   ...giving detailed infomrmation 2   2   ...getting your turn in a fast moving conversation 2   2   ...trying to persuade a friend or family member to see a different point of view 2   2  3= Not at all; 2=A little; 1=Quite a bit; 0=Very much                                                                                                                             TREATMENT DATE:   03/28/24: Chelsea Branch continues to report occasional  word finding difficulties - she is communicating with her friends and family with success. Ongoing training in energy conservation strategies - she verbalized these with occasional min A. Verbal compensations in structured task with rare min  A. Small improvement on PROM. Goals met, d/c ST. She is in agreement  03/21/24: Mackenna reports word finding difficulties several times a week -  To target word finding  Scientist, Product/process Development (VNeST) was utilized. The pt generated 3 subjects and objects for 3 verbs (deliver, decorate, measure) for a total of 9 subject objects. Pt required rare min A cues. Pt generated 3 complex sentences by answering wh questions. Pt required mod I cues to generate complex sentences. Targeted moderately complex word finding naming words with given 1st letter with occasional min semantic cues she named 24 words. In conversation she compensated for 1/1 word finding episodes with rare min A   02/29/24: Targeted verbal compensations for word finding in structured task generating 2-3 salient descriptions of objects/words. Estefany required occasional min to mod questioning cues to generate most salient descriptions (ex: camera she said take pictures - mod A to verbalize how a camera was different from a phone) Generated 3 similarities and differences for 3 sets of related words to train verbal compensations for word finding. In conversation, she required verbal cues describe it to me to use compensations 2/4 opportunities.  02/22/24: Melessa reports some improvement in speech as her speech is clear today - she continues to have to make effort to use slow rate and over articulation to achieve clear, intelligible speech. Targeted word finding in structured complex naming task with given letter - [name] named 20/24 words with occasional min semantic and phonemic cues. Targeted verbal compensations for word finding in structured task describing objects - Lila required questioning cues  and semantic cues to generate the most salient descriptions and to differentiate from a similar object (ex: paper clip vs tape). In conversation, 1 word finding episode which required questioning cues to carryover verbal compensations.   12/20/23: evaluation only d/t payor source requirement   PATIENT EDUCATION: Education details: POC, syllable segmentation, slow rate  Person educated: Patient Education method: Explanation Education comprehension: verbalized understanding and needs further education   GOALS: Goals reviewed with patient? Yes  LONG TERM GOALS: Target date: 05/02/24 (recert 03/21/24) Pt will use verbal compensations for word finding in conversation with rare min A  Baseline: limited compensation for word finding  Goal status: MET  2.  Pt will complete moderately complex structured language task (discourse level) with 90% accuracy to aid in language rehabilitation  Baseline: mild aphasia Goal status:MET  3.  Pt will utilize processing compensations during 15+ minute conversation in 80% of opportunities, evidenced by appropriate responses/comments  Baseline: inconsistent use of compensations Goal status:MET  4.  Pt will report improved communication effectiveness via PROM by 2 points by LTG date Baseline: CPIB=20 Goal status: MET   ASSESSMENT:  CLINICAL IMPRESSION: Patient is a 58 y.o. F who was seen today for ST evaluation for residual aphasia.  Aphasia is resolving - she has word finding difficulties mainly when tired or stressed. All goals met, d/c ST - patient in agreement.   OBJECTIVE IMPAIRMENTS: include aphasia. These impairments are limiting patient from effectively communicating at home and in community. Factors affecting potential to achieve goals and functional outcome are financial resources and family/community support. Patient will benefit from skilled SLP services to address above impairments and improve overall function.  REHAB POTENTIAL:  Good  PLAN:  SLP FREQUENCY: 1x/week  SLP DURATION: 12 weeks (extended for scheduling)  PLANNED INTERVENTIONS: Language facilitation, Cueing hierachy, Internal/external aids, Functional tasks, Multimodal communication approach, SLP instruction and feedback, Compensatory strategies, Patient/family education, and 07492 Treatment  of speech (30 or 45 min)   Check all possible CPT codes: 07492 - SLP treatment    Check all conditions that are expected to impact treatment: Social determinants of health   If treatment provided at initial evaluation, no treatment charged due to lack of authorization.   SPEECH THERAPY DISCHARGE SUMMARY  Visits from Start of Care: 5  Current functional level related to goals / functional outcomes: See goals above   Remaining deficits: Mild aphasia, worse with fatigue   Education / Equipment: Compensations for aphasia and dysarthria; energy conservation s/p CVA   Patient agrees to discharge. Patient goals were met. Patient is being discharged due to meeting the stated rehab goals..     Leen Tworek Ann, CCC-SLP 04/03/2024, 2:33 PM

## 2024-03-29 ENCOUNTER — Other Ambulatory Visit: Payer: Self-pay

## 2024-03-29 DIAGNOSIS — G894 Chronic pain syndrome: Secondary | ICD-10-CM | POA: Diagnosis not present

## 2024-03-29 DIAGNOSIS — M5416 Radiculopathy, lumbar region: Secondary | ICD-10-CM | POA: Diagnosis not present

## 2024-03-30 ENCOUNTER — Other Ambulatory Visit: Payer: Self-pay

## 2024-04-06 ENCOUNTER — Other Ambulatory Visit: Payer: Self-pay | Admitting: *Deleted

## 2024-04-06 NOTE — Patient Outreach (Signed)
 Complex Care Management   Visit Note  04/06/2024  Name:  Chelsea Branch MRN: [id number] DOB: [birth year]  Situation: Referral received for Complex Care Management related to Heart Failure I obtained verbal consent from Patient.  Visit completed with Patient  on the phone  Background:   Past Medical History:  Diagnosis Date   Abnormal thyroid  blood test 03/15/2017   Abnormal uterine bleeding    Alopecia    Anemia    Angina pectoris with normal coronary arteriogram 2017   Had + Troponin c/w ? 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 iniatially ?2/2 peripartum in 1994 - improved by 2008 then worsening EF in 2011 back down to EF 25-30%. b. echo 01/21/14 showed mod LVH, [name]; 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   Peripartum cardiomyopathy 1994   Sleep apnea 2015   CPAP 12/2013   Stroke (HCC) 2011   Systolic and diastolic CHF, acute on chronic (HCC) 06/14/2015   Tobacco abuse    Type 2 diabetes mellitus (HCC) 04/28/2023    Assessment: Patient Reported Symptoms:  Cognitive Cognitive Status: Able to follow simple commands, Alert and oriented to person, place, and time, Normal speech and language skills   Health Maintenance Behaviors: Annual physical exam  Neurological Neurological Review of Symptoms: No symptoms reported Neurological Management Strategies: Medication therapy, Routine screening, Medical device  HEENT HEENT Symptoms Reported: No symptoms reported HEENT Management Strategies: Routine  screening    Cardiovascular Cardiovascular Symptoms Reported: No symptoms reported Does patient have uncontrolled Hypertension?: No Cardiovascular Management Strategies: Medication therapy, Routine screening, Medical device Weight: 218 lb (98.9 kg) (Last reported EPIC weight on 03/09/2024. Pt encouraged to monitor her weight daily and report any fluid retention. States she does her bp and weigh in the morning but can't remember the weight.) Cardiovascular Self-Management Outcome: 4 (good) Cardiovascular Comment: Pt continues to utilize her compression stockings as recommended by her provider and denies any HF symptoms at this time.  Respiratory Respiratory Symptoms Reported: No symptoms reported Additional Respiratory Details: Continues to use her nebulizer and inhaler as prescribed. Respiratory Management Strategies: Medication therapy, Routine screening, Coping strategies, Adequate rest, Breathing techniques Respiratory Self-Management Outcome: 4 (good)  Endocrine Endocrine Symptoms Reported: No symptoms reported Is patient diabetic?: Yes Is patient checking blood sugars at home?: No (Pt does not monitor her CGB with last A1C 7.4 (Aug) as pt continues the medication Ozempi) Endocrine Self-Management Outcome: 4 (good)  Gastrointestinal Gastrointestinal Symptoms Reported: No symptoms reported      Genitourinary Genitourinary Symptoms Reported: No symptoms reported    Integumentary Integumentary Symptoms Reported: No symptoms reported Skin Management Strategies: Routine screening  Musculoskeletal Musculoskelatal Symptoms Reviewed: No symptoms reported Musculoskeletal Management Strategies: Routine screening Falls in the past year?: No Number of falls in past year: 1 or less Was there an injury with Fall?: No Fall Risk Category Calculator: 0 Patient Fall Risk Level: Low Fall Risk Patient at Risk for Falls Due to: No Fall Risks  Psychosocial Psychosocial  Symptoms Reported: No symptoms  reported           There were no vitals filed for this visit. Pain Scale: 0-10 Pain Score: 0-No pain  Medications Reviewed Today     Reviewed by Alvia Olam BIRCH, RN (Registered Nurse) on 04/06/24 at 1316  Med List Status: <None>   Medication Order Taking? Sig Documenting Provider Last Dose Status Informant  acetaminophen  (TYLENOL ) 500 MG tablet [id number] Yes Take 1,000 mg by mouth as needed for mild pain (pain score 1-3) or moderate pain (pain score 4-6). [provider]  Active Self  albuterol  (PROVENTIL ) (2.5 MG/3ML) 0.083% nebulizer solution [id number] Yes Take 3 mLs (2.5 mg total) by nebulization every 6 (six) hours as needed for wheezing or shortness of breath. Newlin, Enobong, MD  Active   albuterol  (VENTOLIN  HFA) 108 (90 Base) MCG/ACT inhaler [id number] Yes Inhale 2 puffs into the lungs every 4 (four) hours as needed for wheezing or shortness of breath. Newlin, Enobong, MD  Active   amoxicillin -clavulanate (AUGMENTIN ) 875-125 MG tablet [id number]  Take 1 tablet by mouth every 12 (twelve) hours.  Patient not taking: Reported on 02/21/2024   Dreama Carmina SAILOR, FNP  Active   aspirin  81 MG chewable tablet [id number] Yes Chew 1 tablet (81 mg total) by mouth daily. Lenard Calin, MD  Active Self  atorvastatin  (LIPITOR) 40 MG tablet [id number] Yes Take 1 tablet (40 mg total) by mouth daily. Newlin, Enobong, MD  Active Self  benzonatate  (TESSALON ) 200 MG capsule [id number] Yes Take 1 capsule (200 mg total) by mouth 2 (two) times daily as needed for cough. Newlin, Enobong, MD  Active   FLUoxetine  (PROZAC ) 20 MG capsule [id number] Yes Take 1 capsule (20 mg total) by mouth daily. Newlin, Enobong, MD  Active Self  fluticasone  (FLONASE ) 50 MCG/ACT nasal spray [id number] Yes Place 2 sprays into both nostrils daily. Rising, Asberry, PA-C  Active Self  HYDROcodone  bit-homatropine (HYCODAN) 5-1.5 MG/5ML syrup [id number]  Take 5 mLs by mouth every 6 (six) hours as needed for cough.  Patient not  taking: Reported on 02/21/2024   Dreama, [place]  N, FNP  Active   hydrOXYzine  (ATARAX ) 25 MG tablet [id number] Yes Take 1 tablet (25 mg total) by mouth at bedtime as needed. Newlin, Enobong, MD  Active Self  ketoconazole  (NIZORAL ) 2 % shampoo [id number] Yes Apply 1 Application topically 2 (two) times a week. Theotis Haze ORN, NP  Active Self  loratadine (CLARITIN) 10 MG tablet [id number] Yes Take 1 tablet (10 mg total) by mouth daily. Newlin, Enobong, MD  Active   methimazole  (TAPAZOLE ) 5 MG tablet [id number] Yes Take 1 tablet (5 mg total) by mouth 2 (two) times daily. Newlin, Enobong, MD  Active   metoprolol  succinate (TOPROL -XL) 50 MG 24 hr tablet [id number] Yes Take 1.5 tablets (75 mg total) by mouth daily. Take with or immediately following a meal. Thukkani, Arun K, MD  Active   Misc. Devices MISC [id number] Yes Blood Pressure Monitor Delbert Clam, MD  Active Self  polyethylene glycol powder (GLYCOLAX /MIRALAX ) 17 GM/SCOOP powder [id number] Yes Take 17 g by mouth daily. Newlin, Enobong, MD  Active Self  polyethylene glycol powder (GLYCOLAX /MIRALAX ) 17 GM/SCOOP powder [id number]  Dissolve 17 grams into water and drink by mouth every day.  Patient not taking: Reported on 01/27/2024     Active Self  potassium chloride  SA (KLOR-CON  M20) 20 MEQ tablet [id number] Yes Take 3 tablets (60 mEq total) by mouth daily. Kennyth Chew, MD  Active   predniSONE  (DELTASONE ) 5 MG  tablet [id number]  Take 1 tablet (5 mg total) by mouth daily with breakfast.  Patient not taking: Reported on 04/06/2024   Newlin, Enobong, MD  Active   sacubitril -valsartan  (ENTRESTO ) 49-51 MG [id number] Yes Take 1 tablet by mouth 2 (two) times daily. Thukkani, Arun K, MD  Active Self  Semaglutide , 1 MG/DOSE, 4 MG/3ML SOPN [id number]  Inject 1 mg as directed once a week. Newlin, Enobong, MD  Active Self  Semaglutide ,0.25 or 0.5MG /DOS, (OZEMPIC , 0.25 OR 0.5 MG/DOSE,) 2 MG/3ML SOPN [id number]  Inject 0.25 mg into the skin once a week. For 4 weeks  then increase to 0.5mg   Patient taking differently: Inject 0.25 mg into the skin every Wednesday. For 4 weeks then increase to 0.5mg    Newlin, Enobong, MD  Active Self  Semaglutide ,0.25 or 0.5MG /DOS, (OZEMPIC , 0.25 OR 0.5 MG/DOSE,) 2 MG/3ML SOPN [id number] Yes Inject 0.5 mg into the skin once a week. For 4 weeks then increase to 1mg  Newlin, Enobong, MD  Active Self  spironolactone  (ALDACTONE ) 25 MG tablet [id number] Yes Take 1 tablet (25 mg total) by mouth daily. Thukkani, Arun K, MD  Active   torsemide  (DEMADEX ) 20 MG tablet [id number] Yes Take 1 tablet (20 mg total) by mouth daily. Newlin, Enobong, MD  Active Self  triamcinolone  cream (KENALOG ) 0.1 % [id number] Yes Apply 1 Application topically 2 (two) times daily.  Patient taking differently: Apply 1 Application topically 2 (two) times daily as needed (skin irritation.).   Newlin, Enobong, MD  Active Self  Vitamin D , Ergocalciferol , (DRISDOL ) 1.25 MG (50000 UNIT) CAPS capsule [id number] Yes Take 1 capsule (50,000 Units total) by mouth every 7 (seven) days. Vicci Barnie NOVAK, MD  Active   Med List Note Beverlee Reyes LELON Bishop 04/09/17 2221):              Recommendation:   PCP Follow-up Continue Current Plan of Care  Follow Up Plan:   Telephone follow up appointment date/time:  05/03/2024 @ 1:30 pm   Olam Ku, RN, BSN [place]  [facility], [facility] RN Care Manager Direct Dial: [phone]  Fax: [phone]

## 2024-04-06 NOTE — Patient Instructions (Signed)
 Visit Information  Chelsea Branch was given information about Medicaid Managed Care team care coordination services as a part of their [facility] Community Plan Medicaid benefit.   If you would like to schedule transportation through your [facility], please call the following number at least 2 days in advance of your appointment: [phone]   Rides for urgent appointments can also be made after hours by calling Member Services.  Call the Behavioral Health Crisis Line at [phone], at any time, 24 hours a day, 7 days a week. If you are in danger or need immediate medical attention call 911.  Please see education materials related to CHF self care provided by MyChart link.  Care plan and visit instructions communicated with the patient verbally today. Patient agrees to receive a copy in MyChart. Active MyChart status and patient understanding of how to access instructions and care plan via MyChart confirmed with patient.     Telephone follow up appointment with Managed Medicaid care management team member scheduled for: 05/03/2024 @ 1:30 pm   Olam Ku, RN, BSN [place]  [facility], [facility] RN Care Manager Direct Dial: [phone]  Fax: [phone]

## 2024-04-09 ENCOUNTER — Other Ambulatory Visit: Payer: Self-pay

## 2024-04-11 ENCOUNTER — Other Ambulatory Visit: Payer: Self-pay | Admitting: Internal Medicine

## 2024-04-11 ENCOUNTER — Telehealth: Payer: Self-pay

## 2024-04-11 ENCOUNTER — Other Ambulatory Visit: Payer: Self-pay

## 2024-04-11 ENCOUNTER — Ambulatory Visit: Payer: Self-pay | Attending: Family Medicine | Admitting: Family Medicine

## 2024-04-11 ENCOUNTER — Encounter: Payer: Self-pay | Admitting: Family Medicine

## 2024-04-11 VITALS — BP 114/77 | HR 75 | Temp 98.3°F | Ht 68.0 in | Wt 216.0 lb

## 2024-04-11 DIAGNOSIS — Z23 Encounter for immunization: Secondary | ICD-10-CM | POA: Diagnosis not present

## 2024-04-11 DIAGNOSIS — E0590 Thyrotoxicosis, unspecified without thyrotoxic crisis or storm: Secondary | ICD-10-CM

## 2024-04-11 DIAGNOSIS — Z5941 Food insecurity: Secondary | ICD-10-CM | POA: Diagnosis not present

## 2024-04-11 DIAGNOSIS — E1169 Type 2 diabetes mellitus with other specified complication: Secondary | ICD-10-CM

## 2024-04-11 DIAGNOSIS — F419 Anxiety disorder, unspecified: Secondary | ICD-10-CM

## 2024-04-11 DIAGNOSIS — F32A Depression, unspecified: Secondary | ICD-10-CM

## 2024-04-11 DIAGNOSIS — I69322 Dysarthria following cerebral infarction: Secondary | ICD-10-CM | POA: Diagnosis not present

## 2024-04-11 DIAGNOSIS — I69398 Other sequelae of cerebral infarction: Secondary | ICD-10-CM

## 2024-04-11 DIAGNOSIS — Z7985 Long-term (current) use of injectable non-insulin antidiabetic drugs: Secondary | ICD-10-CM | POA: Diagnosis not present

## 2024-04-11 DIAGNOSIS — I110 Hypertensive heart disease with heart failure: Secondary | ICD-10-CM | POA: Diagnosis not present

## 2024-04-11 DIAGNOSIS — I6930 Unspecified sequelae of cerebral infarction: Secondary | ICD-10-CM

## 2024-04-11 DIAGNOSIS — I5042 Chronic combined systolic (congestive) and diastolic (congestive) heart failure: Secondary | ICD-10-CM

## 2024-04-11 LAB — POCT GLYCOSYLATED HEMOGLOBIN (HGB A1C): HbA1c, POC (controlled diabetic range): 6.8 % (ref 0.0–7.0)

## 2024-04-11 MED ORDER — HYDROXYZINE HCL 25 MG PO TABS
25.0000 mg | ORAL_TABLET | Freq: Every evening | ORAL | 1 refills | Status: AC | PRN
  Filled 2024-04-11: qty 30, 30d supply, fill #0

## 2024-04-11 MED ORDER — FLUOXETINE HCL 20 MG PO CAPS
20.0000 mg | ORAL_CAPSULE | Freq: Every day | ORAL | 1 refills | Status: AC
  Filled 2024-04-11: qty 90, 90d supply, fill #0

## 2024-04-11 MED ORDER — SACUBITRIL-VALSARTAN 49-51 MG PO TABS
1.0000 | ORAL_TABLET | Freq: Two times a day (BID) | ORAL | 3 refills | Status: AC
  Filled 2024-04-11: qty 90, 45d supply, fill #0
  Filled 2024-05-22: qty 90, 45d supply, fill #1

## 2024-04-11 MED ORDER — ATORVASTATIN CALCIUM 40 MG PO TABS
40.0000 mg | ORAL_TABLET | Freq: Every day | ORAL | 1 refills | Status: AC
  Filled 2024-04-11 – 2024-05-22 (×2): qty 90, 90d supply, fill #0

## 2024-04-11 MED ORDER — SEMAGLUTIDE (2 MG/DOSE) 8 MG/3ML ~~LOC~~ SOPN
2.0000 mg | PEN_INJECTOR | SUBCUTANEOUS | 1 refills | Status: AC
  Filled 2024-04-11: qty 3, fill #0

## 2024-04-11 MED ORDER — METHIMAZOLE 5 MG PO TABS
5.0000 mg | ORAL_TABLET | Freq: Two times a day (BID) | ORAL | 1 refills | Status: DC
  Filled 2024-04-11 – 2024-05-22 (×2): qty 180, 90d supply, fill #0

## 2024-04-11 NOTE — Progress Notes (Signed)
 "  Subjective:  [name], female    DOB: Feb 04, 1966  Age: 58 y.o. MRN: [id number]  CC: Medical Management of Chronic Issues (Requesting PT/OT)     Discussed the use of AI scribe software for clinical note transcription with the patient, who gave verbal consent to proceed.  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  She has previously participated in physical therapy for mobility and dexterity issues following her stroke but requires a new referral to continue therapy. She experiences generalized weakness.  She is experiencing housing instability and financial difficulties, including a reduction in food stamp benefits to eight dollars per month due to receiving SSDI and retirement income. Her monthly income is $1,282, which is largely spent on rent, medication, food, and clothing, leaving little remaining.  Her current medications include a statin, Prozac  for depression, hydroxyzine  for anxiety, methimazole  for thyroid  issues taken twice daily, potassium supplements taken three times daily, torsemide  for fluid retention, and Ozempic  for diabetes management. She recently started a one milligram dose of Ozempic  weekly and has experienced a weight increase from 209 to 216 pounds since September. Her A1c has decreased from 7.4 to 6.8.  No shortness of breath or swelling, but she feels 'fatness' in her body. She is concerned about her weight gain and is mindful of her diet, avoiding sugary drinks. She has not received her flu shot this year.    Past Medical History:   Diagnosis Date   Abnormal thyroid  blood test 03/15/2017   Abnormal uterine bleeding    Alopecia    Anemia    Angina pectoris with normal coronary arteriogram 2017   Had + Troponin c/w ? 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 iniatially ?2/2 peripartum in 1994 - improved by 2008 then worsening EF in 2011 back down to EF 25-30%. b. echo 01/21/14 showed mod LVH, [name]; 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   Peripartum cardiomyopathy 1994   Sleep apnea 2015   CPAP 12/2013   Stroke (HCC) 2011   Systolic and diastolic CHF, acute on chronic (HCC) 06/14/2015   Tobacco abuse    Type 2 diabetes mellitus (HCC) 04/28/2023    Past Surgical History:  Procedure Laterality Date   CARDIAC CATHETERIZATION N/A 06/16/2015   Procedure: Left Heart Cath and Coronary Angiography;  Surgeon: Candyce GORMAN Reek, MD;  Location: [facility] INVASIVE CV LAB;  Service: Cardiovascular;  Laterality: N/A;   CESAREAN SECTION  1992  1994   ICD IMPLANT N/A 01/30/2024   Procedure: ICD IMPLANT;  Surgeon: Kennyth Chew, MD;  Location: [facility] INVASIVE  CV LAB;  Service: Cardiovascular;  Laterality: N/A;   INTRAUTERINE DEVICE (IUD) INSERTION     about 3 years ago   LOOP RECORDER IMPLANT  ~ 2000   spine injections      [facility],  Dr Darlis   TIBIAL TUBERCLERPLASTY     TRANSESOPHAGEAL ECHOCARDIOGRAM (CATH LAB) N/A 04/20/2023   Procedure: TRANSESOPHAGEAL ECHOCARDIOGRAM;  Surgeon: Alvan Ronal BRAVO, MD;  Location: [facility] INVASIVE CV LAB;   Service: Cardiovascular;  Laterality: N/A;   TUBAL LIGATION  05/24/1992    Family History  Problem Relation Age of Onset   Healthy Mother    Stroke Sister    Hypertension Sister    Cancer Maternal Grandmother        uterine   Other Neg Hx    Heart disease Neg Hx     Social History   Socioeconomic History   Marital status: Single    Spouse name: Not on file   Number of children: 3   Years of education: 12   Highest education level: Not on file  Occupational History   Occupation: unemployed  Tobacco Use   Smoking status: Former    Current packs/day: 0.00    Average packs/day: 0.1 packs/day for 30.0 years (3.0 ttl pk-yrs)    Types: Cigarettes    Start date: 06/02/1985    Quit date: 06/03/2015    Years since quitting: 8.8   Smokeless tobacco: Never   Tobacco comments:    Pt. stated she stopped smoking a year ago. 09/29/2018  Vaping Use   Vaping status: Never Used  Substance and Sexual Activity   Alcohol use: No    Alcohol/week: 0.0 standard drinks of alcohol   Drug use: No   Sexual activity: Yes    Birth control/protection: I.U.D.    Comment: fibroids  Other Topics Concern   Not on file  Social History Narrative   Lives at home with 60 yo twins Dawn and [place])   51 yo son lives outside the home   42 yo granddaughter       Update 07/12/2023   Stays with her mother, sister, friend here and there, lost her home, signing up for social security, has been sleeping in her car but she doesn't have it anymore. She does not want to go to a shelter. She was working in [employer] work until she had her stroke in November 2024. She gets food stamps and medicaid.   Social Drivers of Corporate Investment Banker Strain: High Risk (12/23/2023)   Overall Financial Resource Strain (CARDIA)    Difficulty of Paying Living Expenses: Very hard  Food Insecurity: Food Insecurity Present (04/06/2024)   Hunger Vital Sign    Worried About Running Out of Food in the Last Year:  Sometimes true    Ran Out of Food in the Last Year: Sometimes true  Transportation Needs: No Transportation Needs (04/06/2024)   PRAPARE - Administrator, Civil Service (Medical): No    Lack of Transportation (Non-Medical): No  Physical Activity: Not on file  Stress: Stress Concern Present (07/06/2023)   [employer] of Occupational Health - Occupational Stress Questionnaire    Feeling of Stress : Rather much  Social Connections: Moderately Integrated (07/06/2023)   Social Connection and Isolation Panel    Frequency of Communication with Friends and Family: Three times a week    Frequency of Social Gatherings with Friends and Family: Once a week    Attends Religious Services: More than 4  times per year    Active Member of Clubs or Organizations: No    Attends Banker Meetings: Never    Marital Status: Living with partner    Allergies  Allergen Reactions   Ace Inhibitors Cough   Jardiance  [Empagliflozin ] Rash    Yeast infection/rash/UTI    Outpatient Medications Prior to Visit  Medication Sig Dispense Refill   acetaminophen  (TYLENOL ) 500 MG tablet Take 1,000 mg by mouth as needed for mild pain (pain score 1-3) or moderate pain (pain score 4-6).     albuterol  (PROVENTIL ) (2.5 MG/3ML) 0.083% nebulizer solution Take 3 mLs (2.5 mg total) by nebulization every 6 (six) hours as needed for wheezing or shortness of breath. 75 mL 12   albuterol  (VENTOLIN  HFA) 108 (90 Base) MCG/ACT inhaler Inhale 2 puffs into the lungs every 4 (four) hours as needed for wheezing or shortness of breath. 18 g 1   aspirin  81 MG chewable tablet Chew 1 tablet (81 mg total) by mouth daily. 30 tablet 1   benzonatate  (TESSALON ) 200 MG capsule Take 1 capsule (200 mg total) by mouth 2 (two) times daily as needed for cough. 20 capsule 0   fluticasone  (FLONASE ) 50 MCG/ACT nasal spray Place 2 sprays into both nostrils daily. 9.9 mL 2   ketoconazole  (NIZORAL ) 2 % shampoo Apply 1 Application  topically 2 (two) times a week. 120 mL 1   loratadine  (CLARITIN ) 10 MG tablet Take 1 tablet (10 mg total) by mouth daily. 30 tablet 11   metoprolol  succinate (TOPROL -XL) 50 MG 24 hr tablet Take 1.5 tablets (75 mg total) by mouth daily. Take with or immediately following a meal. 135 tablet 1   Misc. Devices MISC Blood Pressure Monitor 1 each 0   polyethylene glycol powder (GLYCOLAX /MIRALAX ) 17 GM/SCOOP powder Take 17 g by mouth daily. 3350 g 1   potassium chloride  SA (KLOR-CON  M20) 20 MEQ tablet Take 3 tablets (60 mEq total) by mouth daily. 180 tablet 1   predniSONE  (DELTASONE ) 5 MG tablet Take 1 tablet (5 mg total) by mouth daily with breakfast. 5 tablet 0   Semaglutide , 1 MG/DOSE, 4 MG/3ML SOPN Inject 1 mg as directed once a week. 3 mL 3   spironolactone  (ALDACTONE ) 25 MG tablet Take 1 tablet (25 mg total) by mouth daily. 90 tablet 1   torsemide  (DEMADEX ) 20 MG tablet Take 1 tablet (20 mg total) by mouth daily. 90 tablet 1   triamcinolone  cream (KENALOG ) 0.1 % Apply 1 Application topically 2 (two) times daily. (Patient taking differently: Apply 1 Application topically 2 (two) times daily as needed (skin irritation.).) 30 g 0   Vitamin D , Ergocalciferol , (DRISDOL ) 1.25 MG (50000 UNIT) CAPS capsule Take 1 capsule (50,000 Units total) by mouth every 7 (seven) days. 12 capsule 1   atorvastatin  (LIPITOR) 40 MG tablet Take 1 tablet (40 mg total) by mouth daily. 90 tablet 1   FLUoxetine  (PROZAC ) 20 MG capsule Take 1 capsule (20 mg total) by mouth daily. 90 capsule 1   hydrOXYzine  (ATARAX ) 25 MG tablet Take 1 tablet (25 mg total) by mouth at bedtime as needed. 30 tablet 1   methimazole  (TAPAZOLE ) 5 MG tablet Take 1 tablet (5 mg total) by mouth 2 (two) times daily. 180 tablet 1   sacubitril -valsartan  (ENTRESTO ) 49-51 MG Take 1 tablet by mouth 2 (two) times daily. 90 tablet 3   Semaglutide ,0.25 or 0.5MG /DOS, (OZEMPIC , 0.25 OR 0.5 MG/DOSE,) 2 MG/3ML SOPN Inject 0.25 mg into the skin once a week. For 4  weeks  then increase to 0.5mg  (Patient taking differently: Inject 0.25 mg into the skin every Wednesday. For 4 weeks then increase to 0.5mg ) 3 mL 0   Semaglutide ,0.25 or 0.5MG /DOS, (OZEMPIC , 0.25 OR 0.5 MG/DOSE,) 2 MG/3ML SOPN Inject 0.5 mg into the skin once a week. For 4 weeks then increase to 1mg  3 mL 0   amoxicillin -clavulanate (AUGMENTIN ) 875-125 MG tablet Take 1 tablet by mouth every 12 (twelve) hours. (Patient not taking: Reported on 04/11/2024) 14 tablet 0   HYDROcodone  bit-homatropine (HYCODAN) 5-1.5 MG/5ML syrup Take 5 mLs by mouth every 6 (six) hours as needed for cough. (Patient not taking: Reported on 04/11/2024) 120 mL 0   polyethylene glycol powder (GLYCOLAX /MIRALAX ) 17 GM/SCOOP powder Dissolve 17 grams into water and drink by mouth every day. (Patient not taking: Reported on 04/11/2024) 3570 g 1   No facility-administered medications prior to visit.     ROS Review of Systems  Constitutional:  Negative for activity change and appetite change.  HENT:  Negative for sinus pressure and sore throat.   Respiratory:  Negative for chest tightness, shortness of breath and wheezing.   Cardiovascular:  Negative for chest pain and palpitations.  Gastrointestinal:  Negative for abdominal distention, abdominal pain and constipation.  Genitourinary: Negative.   Musculoskeletal: Negative.   Neurological:  Positive for weakness.  Psychiatric/Behavioral:  Negative for behavioral problems and dysphoric mood.     Objective:  BP 114/77   Pulse 75   Temp 98.3 F (36.8 C) (Oral)   Ht 5' 8 (1.727 m)   Wt 216 lb (98 kg)   SpO2 97%   BMI 32.84 kg/m      04/11/2024    3:40 PM 04/06/2024    1:25 PM 03/09/2024    8:47 AM  BP/Weight  Systolic BP 114  883  Diastolic BP 77  75  Wt. (Lbs) 216 218 218.2  BMI 32.84 kg/m2 33.15 kg/m2 33.18 kg/m2      Physical Exam Constitutional:      Appearance: She is well-developed.  Cardiovascular:     Rate and Rhythm: Normal rate.     Heart sounds:  Normal heart sounds. No murmur heard. Pulmonary:     Effort: Pulmonary effort is normal.     Breath sounds: Normal breath sounds. No wheezing or rales.  Chest:     Chest wall: No tenderness.  Abdominal:     General: Bowel sounds are normal. There is no distension.     Palpations: Abdomen is soft. There is no mass.     Tenderness: There is no abdominal tenderness.  Musculoskeletal:        General: Normal range of motion.     Right lower leg: No edema.     Left lower leg: No edema.  Neurological:     Mental Status: She is alert and oriented to person, place, and time.  Psychiatric:        Mood and Affect: Mood normal.        Latest Ref Rng & Units 03/09/2024    9:27 AM 02/01/2024    6:57 AM 02/01/2024    6:54 AM  CMP  Glucose 70 - 99 mg/dL 81  856  857   BUN 6 - 24 mg/dL 12  7  8    Creatinine 0.57 - 1.00 mg/dL 9.18  9.29  9.27   Sodium 134 - 144 mmol/L 141  140  138   Potassium 3.5 - 5.2 mmol/L 3.6  3.6  3.7   Chloride  96 - 106 mmol/L 104  106  105   CO2 20 - 29 mmol/L 24   21   Calcium  8.7 - 10.2 mg/dL 9.3   9.0   Total Protein 6.0 - 8.5 g/dL 6.4     Total Bilirubin 0.0 - 1.2 mg/dL 0.5     Alkaline Phos 49 - 135 IU/L 117     AST 0 - 40 IU/L 19     ALT 0 - 32 IU/L 34       Lipid Panel     Component Value Date/Time   CHOL 121 07/06/2023 1149   TRIG 68 07/06/2023 1149   HDL 52 07/06/2023 1149   CHOLHDL 2.3 07/06/2023 1149   CHOLHDL 3.5 04/12/2023 0727   VLDL 12 04/12/2023 0727   LDLCALC 55 07/06/2023 1149    CBC    Component Value Date/Time   WBC 5.2 03/09/2024 0927   WBC 7.5 02/01/2024 0654   RBC 4.75 03/09/2024 0927   RBC 5.10 02/01/2024 0654   HGB 13.7 03/09/2024 0927   HCT 43.6 03/09/2024 0927   PLT 203 03/09/2024 0927   MCV 92 03/09/2024 0927   MCH 28.8 03/09/2024 0927   MCH 29.0 02/01/2024 0654   MCHC 31.4 (L) 03/09/2024 0927   MCHC 32.6 02/01/2024 0654   RDW 12.3 03/09/2024 0927   LYMPHSABS 2.2 03/09/2024 0927   MONOABS 0.4 04/11/2023 1351    EOSABS 0.1 03/09/2024 0927   BASOSABS 0.0 03/09/2024 0927    Lab Results  Component Value Date   HGBA1C 6.8 04/11/2024   Lab Results  Component Value Date   HGBA1C 6.8 04/11/2024   HGBA1C 7.4 (A) 01/04/2024   HGBA1C 6.7 (H) 10/05/2023     Lab Results  Component Value Date   TSH 0.937 01/04/2024       Assessment & Plan Type 2 diabetes mellitus with other specified complication Recent improvement in glycemic control with A1c decreased from 7.4% to 6.8%. - Continue semaglutide  (Ozempic ) 1 mg weekly for 4 weeks, then increase to 2 mg weekly. -Counseled on Diabetic diet, the healthy plate, 849 minutes of moderate intensity exercise/week Blood sugar logs with fasting goals of 80-120 mg/dl, random of less than 819 and in the event of sugars less than 60 mg/dl or greater than 599 mg/dl encouraged to notify the clinic. Advised on the need for annual eye exams, annual foot exams, Pneumonia vaccine.   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 - Continue current heart failure medications. - Encouraged weight management through diet and exercise. - Continue to follow-up with cardiology  Anxiety and depression Chronic condition managed with fluoxetine  (Prozac ). - Continue hydroxyzine  25 mg at bedtime as needed.  Hyperthyroidism Well-managed with methimazole . Recent thyroid  function tests were normal. - Continue methimazole  5 mg twice daily.  History of stroke Right occipital and left cerebral hemispheric CVA status post TNK therapy with residual dysarthria and weakness Residual weakness and mobility issues. Referral to physical and occupational therapy for mobility and dexterity improvement. - Referred to physical therapy and occupational therapy for mobility and dexterity improvement. - Secondary risk factor  modification with importance of blood pressure, glycemic and lipid control - Continue statin - Continue aspirin    Food insecurity - We have provided her with some food supplies from the clinic - Case manager to see her to assist with housing  resources   General Health Maintenance Due for flu shot and mammogram. Discussed importance of flu vaccination due to congestive heart failure. Need for immunization against influenza- Administered flu shot. - Ordered mammogram.     Meds ordered this encounter  Medications   atorvastatin  (LIPITOR) 40 MG tablet    Sig: Take 1 tablet (40 mg total) by mouth daily.    Dispense:  90 tablet    Refill:  1   FLUoxetine  (PROZAC ) 20 MG capsule    Sig: Take 1 capsule (20 mg total) by mouth daily.    Dispense:  90 capsule    Refill:  1   hydrOXYzine  (ATARAX ) 25 MG tablet    Sig: Take 1 tablet (25 mg total) by mouth at bedtime as needed.    Dispense:  30 tablet    Refill:  1   methimazole  (TAPAZOLE ) 5 MG tablet    Sig: Take 1 tablet (5 mg total) by mouth 2 (two) times daily.    Dispense:  180 tablet    Refill:  1   Semaglutide , 2 MG/DOSE, 8 MG/3ML SOPN    Sig: Inject 2 mg as directed once a week.    Dispense:  3 mL    Refill:  1    Dose increase. Dispense after 05/02/24    Follow-up: Return in about 6 months (around 10/09/2024) for Chronic medical conditions.       Corrina Sabin, MD, FAAFP. [facility] [place], [place] [phone]   04/12/2024, 8:21 AM "

## 2024-04-11 NOTE — Telephone Encounter (Addendum)
 I met with the patient when she was in the clinic today.  She explained that she is currently staying with someone but really needs to find a place of her own. She said she has contacted the [employer] and [employer] and has been given some resources including listings of available housing but she said there is nothing available for her.    She receives $ 1282/month disability and only $8.00/month food stamps.  She said she could spend up to $800/month rent but is concerned about the amount of deposit that would be required as well as the cost of utilities.    I offered to provide her with a listing of housing from [employer] but she said she didn't want it.  I also explained to her that she can check for housing resources on [email or web address]   I offered to refer her to Aloysius Ford, RN /Congregational Nurse who has worked with [facility] and ask [facility] to call her about possible resources.  The patient was agreeable and I confirmed her phone number.

## 2024-04-12 ENCOUNTER — Encounter: Payer: Self-pay | Admitting: Family Medicine

## 2024-04-12 DIAGNOSIS — M5416 Radiculopathy, lumbar region: Secondary | ICD-10-CM | POA: Diagnosis not present

## 2024-04-13 ENCOUNTER — Other Ambulatory Visit: Payer: Self-pay

## 2024-04-13 ENCOUNTER — Telehealth (HOSPITAL_COMMUNITY): Payer: Self-pay | Admitting: Cardiology

## 2024-04-13 NOTE — Telephone Encounter (Signed)
 Called to confirm/remind patient of their appointment at the [facility] on 04/13/2024.   Appointment:   [] Confirmed  [x] Left mess   [] No answer/No voice mail  [] VM Full/unable to leave message  [] Phone not in service  Patient reminded to bring all medications and/or complete list.  Confirmed patient has transportation. Gave directions, instructed to utilize valet parking.

## 2024-04-16 ENCOUNTER — Other Ambulatory Visit: Payer: Self-pay

## 2024-04-16 ENCOUNTER — Ambulatory Visit (HOSPITAL_COMMUNITY)
Admission: RE | Admit: 2024-04-16 | Discharge: 2024-04-16 | Disposition: A | Discharge status: Home / Self Care | Source: Ambulatory Visit | Attending: Cardiology | Admitting: Cardiology

## 2024-04-16 VITALS — BP 150/80 | HR 80 | Wt 219.4 lb

## 2024-04-16 DIAGNOSIS — I5042 Chronic combined systolic (congestive) and diastolic (congestive) heart failure: Secondary | ICD-10-CM | POA: Diagnosis present

## 2024-04-16 DIAGNOSIS — Z7982 Long term (current) use of aspirin: Secondary | ICD-10-CM | POA: Insufficient documentation

## 2024-04-16 DIAGNOSIS — Z8673 Personal history of transient ischemic attack (TIA), and cerebral infarction without residual deficits: Secondary | ICD-10-CM | POA: Insufficient documentation

## 2024-04-16 DIAGNOSIS — Z95810 Presence of automatic (implantable) cardiac defibrillator: Secondary | ICD-10-CM | POA: Insufficient documentation

## 2024-04-16 DIAGNOSIS — I5032 Chronic diastolic (congestive) heart failure: Secondary | ICD-10-CM

## 2024-04-16 DIAGNOSIS — I5022 Chronic systolic (congestive) heart failure: Secondary | ICD-10-CM | POA: Diagnosis not present

## 2024-04-16 DIAGNOSIS — I428 Other cardiomyopathies: Secondary | ICD-10-CM | POA: Insufficient documentation

## 2024-04-16 DIAGNOSIS — Z79899 Other long term (current) drug therapy: Secondary | ICD-10-CM | POA: Diagnosis not present

## 2024-04-16 DIAGNOSIS — I639 Cerebral infarction, unspecified: Secondary | ICD-10-CM | POA: Diagnosis not present

## 2024-04-16 DIAGNOSIS — Z9181 History of falling: Secondary | ICD-10-CM | POA: Insufficient documentation

## 2024-04-16 NOTE — Patient Instructions (Signed)
 There has been no changes to your medications.  Your physician recommends that you schedule a follow-up appointment in: 4 months.  If you have any questions or concerns before your next appointment please send us  a message through [place] or call our office at [phone].    TO LEAVE A MESSAGE FOR THE NURSE SELECT OPTION 2, PLEASE LEAVE A MESSAGE INCLUDING: YOUR NAME DATE OF BIRTH CALL BACK NUMBER REASON FOR CALL**this is important as we prioritize the call backs  YOU WILL RECEIVE A CALL BACK THE SAME DAY AS LONG AS YOU CALL BEFORE 4:00 PM  At the [facility], you and your health needs are our priority. As part of our continuing mission to provide you with exceptional heart care, we have created designated Provider Care Teams. These Care Teams include your primary Cardiologist (physician) and Advanced Practice Providers (APPs- Physician Assistants and Nurse Practitioners) who all work together to provide you with the care you need, when you need it.   You may see any of the following providers on your designated Care Team at your next follow up: Dr Toribio Fuel Dr Ezra Shuck Dr. [name] Greig Mosses, NP Caffie Shed, [place] [facility] [place], [place] Beckey Coe, NP Jordan Lee, NP Ellouise Class, NP Tinnie Redman, PharmD Jaun Bash, PharmD   Please be sure to bring in all your medications bottles to every appointment.    Thank you for choosing [facility]

## 2024-04-17 ENCOUNTER — Other Ambulatory Visit: Payer: Self-pay

## 2024-04-18 ENCOUNTER — Other Ambulatory Visit: Payer: Self-pay

## 2024-04-18 ENCOUNTER — Telehealth: Payer: Self-pay

## 2024-04-18 ENCOUNTER — Telehealth: Payer: Self-pay | Admitting: Family Medicine

## 2024-04-18 DIAGNOSIS — L658 Other specified nonscarring hair loss: Secondary | ICD-10-CM

## 2024-04-18 MED ORDER — TORSEMIDE 20 MG PO TABS
20.0000 mg | ORAL_TABLET | Freq: Every day | ORAL | 1 refills | Status: AC
  Filled 2024-04-18 – 2024-05-09 (×2): qty 90, 90d supply, fill #0

## 2024-04-18 MED ORDER — KETOCONAZOLE 2 % EX SHAM
1.0000 | MEDICATED_SHAMPOO | CUTANEOUS | 1 refills | Status: AC
  Filled 2024-04-18 – 2024-05-09 (×2): qty 120, 34d supply, fill #0

## 2024-04-18 NOTE — Telephone Encounter (Signed)
 Copied from CRM #[id number]. Topic: Referral - Status >> Apr 18, 2024  9:56 AM Rosaria BRAVO wrote: Reason for CRM: Pt has questions about their speech therapy referral, please advise.  Best contact: [phone]  Also waiting for dental referral

## 2024-04-18 NOTE — Telephone Encounter (Signed)
 Patient has been called and informed that referral has been placed and they will reach out to her to schedule an appointment.

## 2024-04-18 NOTE — Telephone Encounter (Signed)
 Patient has been called and informed that medication has been refilled.

## 2024-04-18 NOTE — Telephone Encounter (Signed)
 Copied from CRM #[id number]. Topic: Clinical - Medication Question >> Apr 18, 2024  9:58 AM Rosaria BRAVO wrote:  Reason for CRM: Pt wants to speak to the clinic about a shampoo that is prescribed for her.   Best contact: [phone]

## 2024-04-20 ENCOUNTER — Other Ambulatory Visit: Payer: Self-pay

## 2024-04-23 ENCOUNTER — Other Ambulatory Visit: Payer: Self-pay

## 2024-04-24 NOTE — Progress Notes (Signed)
   ADVANCED HEART FAILURE NEW PATIENT CLINIC NOTE  Referring Physician: Delbert Clam, MD  Primary Care: Delbert Clam, MD Primary Cardiologist:  HPI: Chelsea Branch is a 58 y.o. female with a PMH of HFrEF, CVA, peripartum cardiomyopathy who presents for initial visit for further evaluation and treatment of heart failure/cardiomyopathy.      Patient with a longstanding history of heart failure since around 1994, coinciding with the birth of her twins.  Her heart function has fluctuated over the years but recently has remained low at around 30 to 35%.  Was referred to electrophysiology for ICD placement, now referred to heart failure clinic for further management.  Prior history of a stroke 03/2023, uses a walker due to falls and has been attending physical therapy.     SUBJECTIVE:  Overall doing well, underwent ICD placement 01/2024.  No acute issues following this.  She overall has been feeling fairly well and denies any significant worsening of her baseline shortness of breath with exertion, orthopnea, PND.  Has had some weight gain which she attributes to being not as active.  Is hopeful to start going to the gym.   PMH, current medications, allergies, social history, and family history reviewed in epic.  PHYSICAL EXAM: Vitals:   04/16/24 1146  BP: (!) 150/80  Pulse: 80  SpO2: 98%   GENERAL: Well nourished and in no apparent distress at rest.  PULM:  Normal work of breathing, clear to auscultation bilaterally. Respirations are unlabored.  CARDIAC:  JVP: Flat         Normal rate with regular rhythm. No murmurs, rubs or gallops.  Trace edema. Warm and well perfused extremities. ABDOMEN: Soft, non-tender, non-distended. NEUROLOGIC: Patient is oriented x3 with no focal or lateralizing neurologic deficits.    DATA REVIEW  ECG: 01/2024: Normal sinus rhythm  ECHO: 12/08/2023: LVEF 30 to 35%, global hypokinesis, mildly reduced RV function, mild MR  CATH: 05/2015: No  obstructive CAD  ASSESSMENT & PLAN:  Chronic systolic heart failure: Nonischemic cardiomyopathy, suspected peripartum cardiomyopathy with history dating to 1994.  Well compensated, NYHA class II symptoms.  More limited by prior CVA in 2024. - BP elevated today, reports generally 110s at home - Continue Entresto  49/51 mg twice daily - Continue torsemide  20 mg daily, appears euvolemic - Continue spironolactone  25 mg daily - Continue metoprolol  75 mg daily, increase at next visit - Previously did not tolerate Jardiance   Cryptogenic stroke: - Atrial lead placed during ICD, monitoring for atrial fibrillation - Continue aspirin  81 mg daily, atorvastatin  40 mg daily  Follow up in 4 months  [name], MD Advanced Heart Failure Mechanical Circulatory Support 04/24/24

## 2024-04-30 ENCOUNTER — Other Ambulatory Visit: Payer: Self-pay

## 2024-04-30 ENCOUNTER — Ambulatory Visit: Admitting: Student

## 2024-04-30 NOTE — Progress Notes (Deleted)
  Electrophysiology Office Note:   ID:  [name], [name] Sep 22, 1965, MRN [id number]  Primary Cardiologist: Elspeth Sage, MD (Inactive) Electrophysiologist: [name], MD  {Click to update primary MD,subspecialty MD or APP then REFRESH:1}    History of Present Illness:   Chelsea Branch is a 58 y.o. 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  Since last being seen in our clinic the patient reports doing ***.  she denies chest pain, palpitations, dyspnea, PND, orthopnea, nausea, vomiting, dizziness, syncope, edema, weight gain, or early satiety.    Review of systems complete and found to be negative unless listed in HPI.   EP Information / Studies Reviewed:    EKG is ordered today. Personal review as below.       ICD Interrogation-  reviewed in detail today,  See PACEART report.  Arrhythmia/Device History BSx Single Chamber ICD 01/30/2024 for CHF   Physical Exam:   VS:  There were no vitals taken for this visit.   Wt Readings from Last 3 Encounters:  04/16/24 219 lb 6.4 oz (99.5 kg)  04/11/24 216 lb (98 kg)  04/06/24 218 lb (98.9 kg)     GEN: No acute distress *** NECK: No JVD; No carotid bruits CARDIAC: {EPRHYTHM:28826}, no murmurs, rubs, gallops RESPIRATORY:  Clear to auscultation without rales, wheezing or rhonchi  ABDOMEN: Soft, non-tender, non-distended EXTREMITIES:  {EDEMA LEVEL:28147::No} edema; No deformity   ASSESSMENT AND PLAN:    Chronic systolic CHF  s/p [employer] single chamber ICD  NICM euvolemic today Stable on an appropriate medical regimen Normal ICD function See Pace Art report No changes today  H/o Cryptogenic stroke Monitor  Disposition:   Follow up with {EPPROVIDERS:28135::EP Team} {EPFOLLOW UP:28173}   Signed, Ozell Prentice Passey, PA-C

## 2024-05-01 ENCOUNTER — Ambulatory Visit: Attending: Critical Care Medicine | Admitting: Occupational Therapy

## 2024-05-01 ENCOUNTER — Other Ambulatory Visit: Payer: Self-pay

## 2024-05-01 DIAGNOSIS — R278 Other lack of coordination: Secondary | ICD-10-CM | POA: Diagnosis present

## 2024-05-01 DIAGNOSIS — M6281 Muscle weakness (generalized): Secondary | ICD-10-CM | POA: Insufficient documentation

## 2024-05-01 DIAGNOSIS — I6930 Unspecified sequelae of cerebral infarction: Secondary | ICD-10-CM | POA: Insufficient documentation

## 2024-05-01 DIAGNOSIS — R29818 Other symptoms and signs involving the nervous system: Secondary | ICD-10-CM | POA: Diagnosis present

## 2024-05-01 DIAGNOSIS — R208 Other disturbances of skin sensation: Secondary | ICD-10-CM | POA: Diagnosis present

## 2024-05-01 DIAGNOSIS — R41842 Visuospatial deficit: Secondary | ICD-10-CM | POA: Diagnosis present

## 2024-05-01 NOTE — Therapy (Signed)
 OUTPATIENT OCCUPATIONAL THERAPY NEURO EVALUATION  Patient Name: Chelsea Branch MRN: [id number] DOB:[birth year], 58 y.o., female Today's Date: 05/01/2024  PCP: Delbert Clam, MD  REFERRING PROVIDER: Delbert Clam, MD  END OF SESSION:  OT End of Session - 05/01/24 1352     Visit Number 1    Number of Visits 5    Date for Recertification  05/23/24    Authorization Type [facility] Medicaid [id number]    Authorization Time Period VL: 27 AUTH NOT REQUIRED    OT Start Time 1355    OT Stop Time 1445    OT Time Calculation (min) 50 min    Activity Tolerance Patient tolerated treatment well    Behavior During Therapy [facility] for tasks assessed/performed          Past Medical History:  Diagnosis Date   Abnormal thyroid  blood test 03/15/2017   Abnormal uterine bleeding    Alopecia    Anemia    Angina pectoris with normal coronary arteriogram 2017   Had + Troponin c/w ? 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 iniatially ?2/2 peripartum in 1994 - improved by 2008 then worsening EF in 2011 back down to EF 25-30%. b. echo 01/21/14 showed mod LVH, [name]; 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   Peripartum cardiomyopathy 1994   Sleep apnea 2015   CPAP 12/2013   Stroke (HCC) 2011   Systolic and diastolic CHF, acute on chronic (HCC) 06/14/2015   Tobacco abuse    Type 2 diabetes mellitus (HCC) 04/28/2023   Past Surgical History:  Procedure Laterality Date   CARDIAC CATHETERIZATION N/A 06/16/2015   Procedure: Left  Heart Cath and Coronary Angiography;  Surgeon: Candyce GORMAN Reek, MD;  Location: [facility] INVASIVE CV LAB;  Service: Cardiovascular;  Laterality: N/A;   CESAREAN SECTION  1992  1994   ICD IMPLANT N/A 01/30/2024   Procedure: ICD IMPLANT;  Surgeon: Kennyth Chew, MD;  Location: [facility] INVASIVE CV LAB;  Service: Cardiovascular;  Laterality: N/A;   INTRAUTERINE DEVICE (IUD) INSERTION     about 3 years ago   LOOP RECORDER IMPLANT  ~ 2000   spine injections      [facility],  Dr Darlis   TIBIAL TUBERCLERPLASTY     TRANSESOPHAGEAL ECHOCARDIOGRAM (CATH LAB) N/A 04/20/2023   Procedure: TRANSESOPHAGEAL ECHOCARDIOGRAM;  Surgeon: Alvan Ronal BRAVO, MD;  Location: [facility] INVASIVE CV LAB;  Service: Cardiovascular;  Laterality: N/A;   TUBAL LIGATION  05/24/1992   Patient Active Problem List   Diagnosis Date Noted   Dependent edema 02/21/2024   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   History of stroke with residual effects 07/06/2023   Frequent falls 07/06/2023   Type 2 diabetes mellitus (HCC) 04/28/2023   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) 04/28/2023   History of cerebellar stroke 04/13/2023   History of stroke 04/11/2023   Liletta  IUD (intrauterine device) in place since 12/15/2015 04/06/2023   Chronic pain syndrome 12/28/2021   Primary osteoarthritis of  both knees 12/28/2021   Spinal stenosis of lumbar region 07/21/2021   Lumbar radiculitis 07/21/2021   COPD (chronic obstructive pulmonary disease) (HCC) 07/21/2021   Mechanical complication due to intrauterine contraceptive device 07/21/2021   Arthropathy of lumbar facet joint 03/14/2019   Degenerative disc disease at L5-S1 level 10/10/2018   Hyperthyroidism 04/26/2016   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   History of non-ST elevation myocardial infarction (NSTEMI) 06/14/2015   Homelessness 09/16/2014   Vitamin D  deficiency 01/29/2014   OSA (obstructive sleep apnea) 01/08/2014   Chronic combined  systolic and diastolic CHF (congestive heart failure) (HCC) 08/14/2013   Essential hypertension    Fibroids 08/09/2012   Menorrhagia 05/08/2012   Female pattern hair loss    HLD (hyperlipidemia) 06/08/2010   CEREBRAL ANEURYSM 06/08/2010   History of cardiovascular disorder 06/08/2010    ONSET DATE: 04/11/2024 (referral)   REFERRING DIAG: I69.30 (ICD-10-CM) - History of stroke with residual effects   THERAPY DIAG:  Muscle weakness (generalized)  Other lack of coordination  Visuospatial deficit  Other symptoms and signs involving the nervous system  Other disturbances of skin sensation  Rationale for Evaluation and Treatment: Rehabilitation  SUBJECTIVE:   SUBJECTIVE STATEMENT:  Pt returned to OT re-evaluation reporting increased difficulty with right hand weakness, noting she is "dropping things more" and feels her hands "aren't working like they used to." She expressed a desire to improve her hand function, walk independently again, and continue improving her speech. Pt stated she wants to resume meaningful activities and "get back to doing what I used to do."  Pt shared interest in joining a community activity program but reported she has not yet signed up at the [facility]. She also noted that she misses working and that one of her long-term goals is to obtain her own housing. She reported applying for disability benefits but has not contacted [facility] for permanent housing resources despite having been given the information previously.  Pt stated she uses the city bus or medical motor care for transportation. She reported difficulty getting to food pantries and concerns about the high sodium content of many pantry items. Pt indicated she regularly rides the bus to pick up her medications at [facility].  When asked about activities completed since prior OT earlier this year, pt reported practicing: - card games including a new solitaire game, - theraputty exercises, - picking up  coins, - and walking for general activity.  Pt reports she got a distibulator ie) defibrillator put in recently and has a follow up January 9th with cardiologist.  Pt accompanied by: self  PERTINENT HISTORY: Most recent procedure: implantable cardioverter-defibrillator 01/30/24  PMHx: NSTEMI in 2017, NICM (EF 34%, LV global hypokinesis, LVH, dilated LV, grade 1 DD, mild to moderate MR from echo of 05/2023 status post ICD placement in 01/2024), hypertension, OSA, right occipital and left cerebellar hemispheric CVA 04/20/23 status post TNK therapy with residual dysarthria, hyperthyroidism, type 2 diabetes mellitus   PRECAUTIONS: Fall  WEIGHT BEARING RESTRICTIONS: No  PAIN:  Are you having pain? No   FALLS: Has patient fallen in last 6 months? No - 'stumbles' but no falls - does report getting injections in her back.  LIVING ENVIRONMENT: Lives with: Pt reports varied living situations ie) staying at shelters at times ie) 7 pm - 7 am; may stay at the [facility] and mentioned she has not been staying with friends lately but might call one tonight Lives in: Pt reports she has some belongings at her mom's place.  She gets her check on 2nd/3rd and reports needing to get a storage place for her belongings. Stairs: No Has following equipment at home: Vannie - 4 wheeled Rollator  PLOF: Requires assistive device for independence since stroke.  Previously worked in a chemical engineer; does not have a car to drive; Previously liked reading (still reads her Bible); writing; laundry; crocheting (but does not have access to this)  PATIENT GOALS: Pt wants to get into a home of her own; she wants to be able to get away from her walker - has been using it since last year.  She wants to use her hands better and get stronger ie) has been dropping things with her R hand more.  OBJECTIVE:  Note: Objective measures were completed at Evaluation unless otherwise noted.  HAND DOMINANCE:  Right  ADLs: Overall ADLs: Mod Ind  Equipment: N/A.  Pt previously had a shower chair but lost her storage unit and is homeless She has to stand in the shower and has stumbled but not fallen  IADLs: Homeless   Laundry - may go to [employer] and sits in her rollator to perform tasks  Meals: Might make a salad - reports she can cut up things, also reports liking baked chicken etc Pt has to sit for other light housekeeping and take breaks in between  Handwriting: NT  MOBILITY STATUS: Needs Assist: Ambulates with use of rollator  ACTIVITY TOLERANCE: Activity tolerance: fair to poor  FUNCTIONAL OUTCOME MEASURES: 11/29/23: Junie Dash: 40.9 % disability 01/25/24: 4.5 % disability with use of BUE  05/01/24: 25%    UPPER EXTREMITY ROM:    BUE: WNL  UPPER EXTREMITY MMT:     BUE: WFL though poor endurance  HAND FUNCTION: 11/29/23: Grip strength: Right: 37.2 lbs; Left: 36.5 lbs  05/01/24: Right: 17.8, 19.8, 18.9 lbs Left:  24.2, 22.9, 21.1 lbs Average: Right - 18.5 lbs; Left - 22.7 lbs  COORDINATION: 11/29/23: 9 Hole Peg test: Right: 24 sec; Left: 25 sec  05/01/24: Right: 38.63 sec Left: 29.46 sec  SENSATION: 11/29/23: Paresthesias in RUE from previous work-related accident  05/01/24: Reports some tingling in R hand ~ 3x/week - midday.  EDEMA: mild reported and observed in RUE  MUSCLE TONE: WFL  COGNITION: Overall cognitive status: Impaired  VISION: Subjective report: Pt continues to report difficulty seeing items on left side and has spots in vision that are not clear. She turns head to left side to be able to see things. Baseline vision: Still does not have corrected lenses Visual history: Thyroid  eye disease  VISION ASSESSMENT: Previous assessment: Reading and distance acuity: WFL 45* left peripheral vision; R peripheral vision WFL  PERCEPTION: 11/26/23: Impaired: Spatial orientation: unable to orient to dry erase marker; reports difficulty with writing to make letters  correctly  PRAXIS: Impaired: Motor planning  OBSERVATIONS: Pt ambulates with use of rollator. No loss of balance. The pt appears fairly well kept.  TODAY'S TREATMENT :    Treatment time not billed as insurance does not reimburse for treatment provided at evaluation.    OT educated pt on rehabilitation process and results of objective measures in relation to pt specific goals.  Education provided on [employer] resources and ongoing HEP ideas with provision of Green resistance foam block for further progression of strengthening activities.    PATIENT EDUCATION: Education details: OT role and POC Considerations; strengthening activities Person educated: Patient Education method: Explanation, Demonstration, and Verbal cues Education comprehension: verbalized understanding and needs further education  HOME EXERCISE PROGRAM: N/A for this visit  GOALS:  LONG TERM GOALS: Target date: 05/23/2024    Patient will demonstrate UE HEP with visual handouts only for proper execution. Baseline: Returning to outpt OT with decreased UE strength (15-20 lbs) and coordination. Goal status: INITIAL 05/01/24: Average: Right - 18.5 lbs; Left - 22.7 lbs  2.  Patient will demonstrate at least 30 lbs B grip strength as needed to open jars and other containers. Baseline: Average: Right - 18.5 lbs; Left - 22.7 lbs Goal status: INITIAL  3.  Patient will independently recall and utilize at least 2 community resources for accessing daily and social needs. Baseline: Homeless with limited resources Goal status: INITIAL  ASSESSMENT:  CLINICAL IMPRESSION: Patient is a 58 y.o. female who was seen today for occupational therapy evaluation for h/o CVA and recent defibrillator placement. Hx includes NSTEMI in 2017,  NICM (EF 34%, LV global hypokinesis, LVH, dilated LV, grade 1 DD, mild to  moderate MR from echo of 05/2023), hypertension, OSA, right occipital and left cerebellar hemispheric CVA status post TNK therapy with residual dysarthria, hyperthyroidism, type 2 diabetes mellitus. Patient currently presents below baseline level of functioning demonstrating functional deficits and impairments as noted below. Pt would benefit from skilled OT services in the outpatient setting to work on impairments as noted below to help pt return to PLOF as able.    PERFORMANCE DEFICITS: in functional skills including ADLs, IADLs, coordination, sensation, edema, strength, Fine motor control, mobility, endurance, decreased knowledge of use of DME, vision, and UE functional use, cognitive skills including orientation and perception, and psychosocial skills including coping strategies, environmental adaptation, and routines and behaviors.   IMPAIRMENTS: are limiting patient from ADLs, IADLs, rest and sleep, work, and leisure.   CO-MORBIDITIES: has co-morbidities such as NICM and COPD that affects occupational performance. Patient will benefit from skilled OT to address above impairments and improve overall function.  MODIFICATION OR ASSISTANCE TO COMPLETE EVALUATION: Min-Moderate modification of tasks or assist with assess necessary to complete an evaluation.  OT OCCUPATIONAL PROFILE AND HISTORY: Detailed assessment: Review of records and additional review of physical, cognitive, psychosocial history related to current functional performance.  CLINICAL DECISION MAKING: Moderate - several treatment options, min-mod task modification necessary  REHAB POTENTIAL: Good for goals stated  EVALUATION COMPLEXITY: Moderate    PLAN:  OT FREQUENCY: 1-2x/week  OT DURATION: 4 weeks  PLANNED INTERVENTIONS: 97168 OT Re-evaluation, 97535 self care/ADL training, 02889 therapeutic exercise, 97530 therapeutic activity, 97112 neuromuscular re-education, 97140 manual therapy, 97035 ultrasound, 97039 fluidotherapy,  functional mobility training, visual/perceptual remediation/compensation, energy conservation, coping strategies training, patient/family education, and DME and/or AE instructions  RECOMMENDED OTHER SERVICES: Optometry appt. Continue with [facility] for health management/SW  CONSULTED AND AGREED WITH PLAN OF CARE: Patient  PLAN FOR NEXT SESSION: HEP for ongoing basic strengthening; daily routines in place of work; community resources  Clarita LITTIE Pride, OT 05/01/2024, 3:21 PM  For all possible CPT codes, reference the Planned Interventions line above.     Check all conditions that are expected to impact treatment: {Conditions expected to impact treatment:Respiratory disorders, Cognitive Impairment or Intellectual disability, Diabetes mellitus, Social determinants of health, and Active major medical illness   If treatment provided at initial evaluation, no treatment charged due to lack of authorization.

## 2024-05-02 ENCOUNTER — Other Ambulatory Visit: Payer: Self-pay

## 2024-05-03 ENCOUNTER — Telehealth: Payer: Self-pay | Admitting: *Deleted

## 2024-05-03 ENCOUNTER — Encounter: Payer: Self-pay | Admitting: *Deleted

## 2024-05-03 NOTE — Patient Instructions (Signed)
 Chelsea Branch - I am sorry I was unable to reach you today for our scheduled appointment. I work with Newlin, Enobong, MD and am calling to support your healthcare needs. Please contact me at [phone] at your earliest convenience. I look forward to speaking with you soon.   Thank you,   Olam Ku, RN, BSN [place]  [facility], [facility] RN Care Manager Direct Dial: [phone]  Fax: [phone]

## 2024-05-08 ENCOUNTER — Ambulatory Visit: Admitting: Occupational Therapy

## 2024-05-08 DIAGNOSIS — R208 Other disturbances of skin sensation: Secondary | ICD-10-CM

## 2024-05-08 DIAGNOSIS — R278 Other lack of coordination: Secondary | ICD-10-CM

## 2024-05-08 DIAGNOSIS — R29818 Other symptoms and signs involving the nervous system: Secondary | ICD-10-CM

## 2024-05-08 DIAGNOSIS — R41842 Visuospatial deficit: Secondary | ICD-10-CM

## 2024-05-08 DIAGNOSIS — M6281 Muscle weakness (generalized): Secondary | ICD-10-CM

## 2024-05-08 NOTE — Therapy (Signed)
 OUTPATIENT OCCUPATIONAL THERAPY NEURO TREATMENT  Patient Name: Vanassa Penniman MRN: [id number] DOB:08-03-1965, 58 y.o., female Today's Date: 05/08/2024  PCP: Delbert Clam, MD  REFERRING PROVIDER: Delbert Clam, MD  END OF SESSION:  OT End of Session - 05/08/24 1230     Visit Number 2    Number of Visits 5    Date for Recertification  05/23/24    Authorization Type [facility] Medicaid 2025    Authorization Time Period VL: 27 AUTH NOT REQUIRED    OT Start Time 1230    OT Stop Time 1315    OT Time Calculation (min) 45 min    Equipment Utilized During Treatment Cards, Putty    Activity Tolerance Patient tolerated treatment well    Behavior During Therapy WFL for tasks assessed/performed          Past Medical History:  Diagnosis Date   Abnormal thyroid  blood test 03/15/2017   Abnormal uterine bleeding    Alopecia    Anemia    Angina pectoris with normal coronary arteriogram 2017   Had + Troponin c/w ? 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 iniatially ?2/2 peripartum in 1994 - improved by 2008 then worsening EF in 2011 back down to EF 25-30%. b. echo 01/21/14 showed mod LVH, [name]; 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   Peripartum cardiomyopathy 1994   Sleep apnea 2015   CPAP 12/2013   Stroke (HCC) 2011   Systolic and diastolic CHF, acute on chronic (HCC) 06/14/2015   Tobacco abuse    Type 2 diabetes mellitus (HCC) 04/28/2023   Past Surgical History:  Procedure Laterality Date   CARDIAC  CATHETERIZATION N/A 06/16/2015   Procedure: Left Heart Cath and Coronary Angiography;  Surgeon: Candyce GORMAN Reek, MD;  Location: [facility] INVASIVE CV LAB;  Service: Cardiovascular;  Laterality: N/A;   CESAREAN SECTION  1992  1994   ICD IMPLANT N/A 01/30/2024   Procedure: ICD IMPLANT;  Surgeon: Kennyth Chew, MD;  Location: [facility] INVASIVE CV LAB;  Service: Cardiovascular;  Laterality: N/A;   INTRAUTERINE DEVICE (IUD) INSERTION     about 3 years ago   LOOP RECORDER IMPLANT  ~ 2000   spine injections      [facility],  Dr Darlis   TIBIAL TUBERCLERPLASTY     TRANSESOPHAGEAL ECHOCARDIOGRAM (CATH LAB) N/A 04/20/2023   Procedure: TRANSESOPHAGEAL ECHOCARDIOGRAM;  Surgeon: Alvan Ronal BRAVO, MD;  Location: [facility] INVASIVE CV LAB;  Service: Cardiovascular;  Laterality: N/A;   TUBAL LIGATION  05/24/1992   Patient Active Problem List   Diagnosis Date Noted   Dependent edema 02/21/2024   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   History of stroke with residual effects 07/06/2023   Frequent falls 07/06/2023   Type 2 diabetes mellitus (HCC) 04/28/2023   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) 04/28/2023   History of cerebellar stroke 04/13/2023   History of stroke 04/11/2023   Liletta  IUD (intrauterine device) in place since 12/15/2015 04/06/2023  Chronic pain syndrome 12/28/2021   Primary osteoarthritis of both knees 12/28/2021   Spinal stenosis of lumbar region 07/21/2021   Lumbar radiculitis 07/21/2021   COPD (chronic obstructive pulmonary disease) (HCC) 07/21/2021   Mechanical complication due to intrauterine contraceptive device 07/21/2021   Arthropathy of lumbar facet joint 03/14/2019   Degenerative disc disease at L5-S1 level 10/10/2018   Hyperthyroidism 04/26/2016   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   History of non-ST elevation myocardial infarction (NSTEMI) 06/14/2015   Homelessness 09/16/2014   Vitamin D  deficiency 01/29/2014   OSA (obstructive  sleep apnea) 01/08/2014   Chronic combined systolic and diastolic CHF (congestive heart failure) (HCC) 08/14/2013   Essential hypertension    Fibroids 08/09/2012   Menorrhagia 05/08/2012   Female pattern hair loss    HLD (hyperlipidemia) 06/08/2010   CEREBRAL ANEURYSM 06/08/2010   History of cardiovascular disorder 06/08/2010    ONSET DATE: 04/11/2024 (referral)   REFERRING DIAG: I69.30 (ICD-10-CM) - History of stroke with residual effects   THERAPY DIAG:  Muscle weakness (generalized)  Other lack of coordination  Visuospatial deficit  Other symptoms and signs involving the nervous system  Other disturbances of skin sensation  Rationale for Evaluation and Treatment: Rehabilitation  SUBJECTIVE:   SUBJECTIVE STATEMENT:  Pt reports she is staying with her mother at this time.  OTR reviewed importance of answering the phone for nurses call tomorrow as she missed a phone call last week re: coordination of care.  Upon review of prior contact with SW/nursing care coordination, pt continues to report that they don't help with housing like she needs.   Pt accompanied by: self  PERTINENT HISTORY: Most recent procedure: implantable cardioverter-defibrillator 01/30/24  PMHx: NSTEMI in 2017, NICM (EF 34%, LV global hypokinesis, LVH, dilated LV, grade 1 DD, mild to moderate MR from echo of 05/2023 status post ICD placement in 01/2024), hypertension, OSA, right occipital and left cerebellar hemispheric CVA 04/20/23 status post TNK therapy with residual dysarthria, hyperthyroidism, type 2 diabetes mellitus   PRECAUTIONS: Fall  WEIGHT BEARING RESTRICTIONS: No  PAIN:  Are you having pain? No   FALLS: Has patient fallen in last 6 months? No - 'stumbles' but no falls - does report getting injections in her back.  LIVING ENVIRONMENT: Lives with: Pt reports varied living situations ie) staying at shelters at times ie) 7 pm - 7 am; may stay at the [facility] and mentioned she has not been  staying with friends lately but might call one tonight Lives in: Pt reports she has some belongings at her mom's place.  She gets her check on 2nd/3rd and reports needing to get a storage place for her belongings. Stairs: No Has following equipment at home: Vannie - 4 wheeled Rollator  PLOF: Requires assistive device for independence since stroke.  Previously worked in a chemical engineer; does not have a car to drive; Previously liked reading (still reads her Bible); writing; laundry; crocheting (but does not have access to this)  PATIENT GOALS: Pt wants to get into a home of her own; she wants to be able to get away from her walker - has been using it since last year.  She wants to use her hands better and get stronger ie) has been dropping things with her R hand more.  OBJECTIVE:  Note: Objective measures were completed at Evaluation unless otherwise noted.  HAND DOMINANCE: Right  ADLs: Overall ADLs: Mod Ind  Equipment: N/A.  Pt previously had a shower chair but lost her storage unit and  is homeless She has to stand in the shower and has stumbled but not fallen  IADLs: Homeless   Laundry - may go to [employer] and sits in her rollator to perform tasks  Meals: Might make a salad - reports she can cut up things, also reports liking baked chicken etc Pt has to sit for other light housekeeping and take breaks in between  Handwriting: NT  MOBILITY STATUS: Needs Assist: Ambulates with use of rollator  ACTIVITY TOLERANCE: Activity tolerance: fair to poor  FUNCTIONAL OUTCOME MEASURES: 11/29/23: Junie Dash: 40.9 % disability 01/25/24: 4.5 % disability with use of BUE  05/01/24: 25%    UPPER EXTREMITY ROM:    BUE: WNL  UPPER EXTREMITY MMT:     BUE: WFL though poor endurance  HAND FUNCTION: 11/29/23: Grip strength: Right: 37.2 lbs; Left: 36.5 lbs  05/01/24: Right: 17.8, 19.8, 18.9 lbs Left:  24.2, 22.9, 21.1 lbs Average: Right - 18.5 lbs; Left - 22.7  lbs  COORDINATION: 11/29/23: 9 Hole Peg test: Right: 24 sec; Left: 25 sec  05/01/24: Right: 38.63 sec Left: 29.46 sec  SENSATION: 11/29/23: Paresthesias in RUE from previous work-related accident  05/01/24: Reports some tingling in R hand ~ 3x/week - midday.  EDEMA: mild reported and observed in RUE  MUSCLE TONE: WFL  COGNITION: Overall cognitive status: Impaired  VISION: Subjective report: Pt continues to report difficulty seeing items on left side and has spots in vision that are not clear. She turns head to left side to be able to see things. Baseline vision: Still does not have corrected lenses Visual history: Thyroid  eye disease  VISION ASSESSMENT: Previous assessment: Reading and distance acuity: WFL 45* left peripheral vision; R peripheral vision WFL  PERCEPTION: 11/26/23: Impaired: Spatial orientation: unable to orient to dry erase marker; reports difficulty with writing to make letters correctly  PRAXIS: Impaired: Motor planning  OBSERVATIONS: Pt ambulates with use of rollator. No loss of balance. The pt appears fairly well kept.                                                                                                                           TODAY'S TREATMENT :    - Self Care education and training completed for duration as noted below including: Pt assisted to get information about locations to contact for housing as well as information re: nursing care coordination (all as noted at bottom of pt instructions today).  Handout printed with info for her to use to call about possible apartments and with contact info and appt time for nurisng care all tomorrow.  - Therapeutic activities completed for duration as noted below including: OT educated pt on table top play of Golf Solitaire for RUE to address fine motor coordination, upper extremity range of motion, scanning and locating of items, processing, bimanual coordination/trunk control, sequencing of unfamiliar motor  movements or tasks, motor planning, item/pattern recognition, and endurance/stamina. Pt required moderate cues for proper play.   Progressed and  practiced Putty exercises/activities to progress strengthening, coordination and sensory stimulation of B UEs.  Patient provided visual demonstration, verbal and tactile cues as needed to improve performance of the various exercises/activities including:   - Putty Squeezes - cues to squeeze putty into log for use with other exercises and to fold putty in half with 1 hand or roll it into a cinnamon roll with one hand  - Putty Rolls - encourage to roll putty into logs with sensory stimulation to entire length of hand, fingers and wrist as needed   - Pinch and Pull with Putty - this motion is combined with different pinches (3-Point Pinch, Tip Pinch, Key Pinch) - patient encouraged to combine tripod, pincer and/or key pinch with pinch and pull motion of putty pulling away from midline, changing between different pinches and changing different directions to change grip  - Finger Extension with Putty - pt shown how to work on task with all fingers and thumb as well as individual fingers in opposition to thumb  - Finger Adduction with Putty - pt shown how to work on weaving putty between fingers/thumb and then squeeze fingers together while laying hand flat on table top.  - Removing Objects from Putty  - encouraged to hide items (coins, marble, dice etc) and use one hand at a time to find the objects and identify them by tactile input before s/he digs them out and can see them visually.  OT educated patient on theraputty recommendations: avoid small children, pets, hot environments, place in designated container and avoid contact with fabrics. Patient verbalized understanding.    Patient benefited from extra time, verbal/tactile cues, and modeling of task to allow time for processing of verbal instructions and improve motor planning of unfamiliar  movements.  PATIENT EDUCATION: Education details: Buyer, Retail Activities Person educated: Patient Education method: Explanation, Demonstration, Actor cues, Verbal cues, and Handouts Education comprehension: verbalized understanding, returned demonstration, verbal cues required, tactile cues required, and needs further education  HOME EXERCISE PROGRAM: 12/19/23: Visual Scanning and Visual Compensation 12/27/2023: golf solitaire; EC strategies; putty HEP 01/02/2024: sleep hygiene 01/17/24: coordination activities  05/08/24: Reissued gold Solitaire and new Putty HEP Access Code: [email or web address]  GOALS:  LONG TERM GOALS: Target date: 05/23/2024    Patient will demonstrate UE HEP with visual handouts only for proper execution. Baseline: Returning to outpt OT with decreased UE strength (15-20 lbs) and coordination. Goal status: IN Progress 05/01/24: Average: Right - 18.5 lbs; Left - 22.7 lbs  2.  Patient will demonstrate at least 30 lbs B grip strength as needed to open jars and other containers. Baseline: Average: Right - 18.5 lbs; Left - 22.7 lbs Goal status: IN Progress   3.  Patient will independently recall and utilize at least 2 community resources for accessing daily and social needs. Baseline: Homeless with limited resources Goal status: IN Progress  ASSESSMENT:  CLINICAL IMPRESSION: Patient is a 58 y.o. female who was seen today for occupational therapy evaluation for h/o CVA and recent defibrillator placement. Reviewed HEP ideas with pt who reports she has putty at home and activities updated to further progress strength and coordination.  Pt open and receptive to all education with cues to take handouts for helping with memory for tasks. Pt will benefit from further skilled OT services in the outpatient setting to work on impairments as noted below to help pt return to PLOF as able.   PERFORMANCE DEFICITS: in functional skills including ADLs, IADLs, coordination,  sensation, edema, strength, Fine  motor control, mobility, endurance, decreased knowledge of use of DME, vision, and UE functional use, cognitive skills including orientation and perception, and psychosocial skills including coping strategies, environmental adaptation, and routines and behaviors.   IMPAIRMENTS: are limiting patient from ADLs, IADLs, rest and sleep, work, and leisure.   CO-MORBIDITIES: has co-morbidities such as NICM and COPD that affects occupational performance. Patient will benefit from skilled OT to address above impairments and improve overall function.  REHAB POTENTIAL: Good for goals stated  PLAN:  OT FREQUENCY: 1-2x/week  OT DURATION: 4 weeks  PLANNED INTERVENTIONS: 97168 OT Re-evaluation, 97535 self care/ADL training, 02889 therapeutic exercise, 97530 therapeutic activity, 97112 neuromuscular re-education, 97140 manual therapy, 97035 ultrasound, 97039 fluidotherapy, functional mobility training, visual/perceptual remediation/compensation, energy conservation, coping strategies training, patient/family education, and DME and/or AE instructions  RECOMMENDED OTHER SERVICES: Optometry appt. Continue with [facility] for health management/SW  CONSULTED AND AGREED WITH PLAN OF CARE: Patient  PLAN FOR NEXT SESSION: Progress HEP for ongoing basic strengthening; daily routines in place of work; community resources  Clarita LITTIE Pride, OT 05/08/2024, 2:10 PM

## 2024-05-08 NOTE — Patient Instructions (Signed)
 Access Code: [email or web address] URL: [email or web address] Date: 05/08/2024 Prepared by: Clarita Pride  Exercises - Putty Squeezes  - 1-2 x daily - 10 reps - Rolling Putty on Table  - 1-2 x daily - 10 reps - Finger Pinch and Pull with Putty  - 1-2 x daily - 10 reps - 3-Point Pinch with Putty  - 1-2 x daily - 10 reps - Tip PUSH with Putty  - 1-2 x daily - 10 reps - Key Pinch with Putty  - 1-2 x daily - 10 reps - Finger Extension with Putty  - 1-2 x daily - 10 reps - Finger Adduction with Putty  - 1-2 x daily - 10 reps - Removing Marbles from Putty  - 1-2 x daily - 10 reps

## 2024-05-09 ENCOUNTER — Other Ambulatory Visit: Payer: Self-pay

## 2024-05-09 ENCOUNTER — Telehealth: Payer: Self-pay | Admitting: *Deleted

## 2024-05-09 ENCOUNTER — Encounter: Payer: Self-pay | Admitting: *Deleted

## 2024-05-09 NOTE — Patient Instructions (Signed)
 Chelsea Branch - I am sorry I was unable to reach you today for our scheduled appointment. I work with Newlin, Enobong, MD and am calling to support your healthcare needs. Please contact me at [phone] at your earliest convenience. I look forward to speaking with you soon.   Thank you,   Olam Ku, RN, BSN [place]  [facility], [facility] RN Care Manager Direct Dial: [phone]  Fax: [phone]

## 2024-05-10 ENCOUNTER — Telehealth: Payer: Self-pay | Admitting: Family Medicine

## 2024-05-10 DIAGNOSIS — E1169 Type 2 diabetes mellitus with other specified complication: Secondary | ICD-10-CM

## 2024-05-10 NOTE — Telephone Encounter (Signed)
 Requesting referral to eye doctor.

## 2024-05-10 NOTE — Telephone Encounter (Signed)
 Copied from CRM [id number]. Topic: Referral - Request for Referral >> May 10, 2024 10:45 AM Shanda MATSU wrote:  Did the patient discuss referral with their provider in the last year? Yes (If No - schedule appointment) (If Yes - send message)  Appointment offered? No  Type of order/referral and detailed reason for visit: to see a vision provider  Preference of office, provider, location: N/A  If referral order, have you been seen by this specialty before? No (If Yes, this issue or another issue? When? Where?  Can we respond through MyChart? No

## 2024-05-11 ENCOUNTER — Other Ambulatory Visit: Payer: Self-pay

## 2024-05-11 ENCOUNTER — Telehealth: Payer: Self-pay

## 2024-05-11 ENCOUNTER — Ambulatory Visit: Payer: Self-pay

## 2024-05-11 DIAGNOSIS — E1169 Type 2 diabetes mellitus with other specified complication: Secondary | ICD-10-CM

## 2024-05-11 MED ORDER — SEMAGLUTIDE (2 MG/DOSE) 8 MG/3ML ~~LOC~~ SOPN
2.0000 mg | PEN_INJECTOR | SUBCUTANEOUS | 1 refills | Status: DC
  Filled 2024-05-11: qty 3, fill #0

## 2024-05-11 MED ORDER — SEMAGLUTIDE (2 MG/DOSE) 8 MG/3ML ~~LOC~~ SOPN
2.0000 mg | PEN_INJECTOR | SUBCUTANEOUS | 1 refills | Status: DC
  Filled 2024-05-11: qty 3, 28d supply, fill #0

## 2024-05-11 NOTE — Telephone Encounter (Signed)
 Please advise

## 2024-05-11 NOTE — Telephone Encounter (Signed)
 Copied from CRM #[id number]. Topic: Clinical - Medication Question >> May 11, 2024  3:43 PM Dedra B wrote:  Reason for CRM: Pt needs clarification on semaglutide  dosage before she starts taking it.  >> May 11, 2024  4:02 PM Willma R wrote:  Patient is trying to verify the information now as she is trying to administer the shot today. Warm transferred to CAL.

## 2024-05-11 NOTE — Addendum Note (Signed)
 Addended by: Malania Gawthrop on: 05/11/2024 10:48 AM   Modules accepted: Orders

## 2024-05-11 NOTE — Telephone Encounter (Signed)
 Called CAL spoke Chelsea Branch.  Clarification of Semaglutide  dosage.  Chelsea Branch states to transfer call, call transferred.

## 2024-05-11 NOTE — Telephone Encounter (Signed)
 Referral has been placed.

## 2024-05-11 NOTE — Telephone Encounter (Signed)
 Spoke with patient. Medication was reviewed and verified.  Advised Patient  that she should be administering 2 mg of Ozempic  subcutaneously once weekly. Patient was able to accurately restate her prescribed dose and demonstrated understanding of the medication regimen.

## 2024-05-11 NOTE — Telephone Encounter (Signed)
 Copied from CRM #[id number]. Topic: Clinical - Medication Question >> May 11, 2024  4:09 PM Willma R wrote: Reason for CRM: Pt needs clarification on semaglutide  dosage before she starts taking it.  Patient is trying to verify the information now as she is trying to administer the shot today. Answer Assessment - Initial Assessment Questions 1. NAME of MEDICINE: What medicine(s) are you calling about?     Semaglutide  2. QUESTION: What is your question? (e.g., double dose of medicine, side effect)     Clarification of dose 3. PRESCRIBER: Who prescribed the medicine? Reason: if prescribed by specialist, call should be referred to that group.     Newlin MD 4. SYMPTOMS: Do you have any symptoms? If Yes, ask: What symptoms are you having?  How bad are the symptoms (e.g., mild, moderate, severe)     Denies any symptoms Inject 1mg  once per week per label. What dose do I inject, was told I suppose to take 2 mg.  Protocols used: Medication Question Call-A-AH

## 2024-05-11 NOTE — Telephone Encounter (Signed)
 Patient disconnected call prior to NT calling CAL. Nurse will attempt to call patient.

## 2024-05-11 NOTE — Telephone Encounter (Signed)
LVM informing patient that referral has been placed.

## 2024-05-14 ENCOUNTER — Other Ambulatory Visit: Payer: Self-pay

## 2024-05-14 NOTE — Telephone Encounter (Signed)
Please see previous noted.

## 2024-05-16 ENCOUNTER — Other Ambulatory Visit: Payer: Self-pay | Admitting: *Deleted

## 2024-05-16 NOTE — Patient Instructions (Signed)
 Visit Information  Chelsea Branch was given information about Medicaid Managed Care team care coordination services as a part of their [facility] Community Plan Medicaid benefit.   If you would like to schedule transportation through your [facility], please call the following number at least 2 days in advance of your appointment: [phone]   Rides for urgent appointments can also be made after hours by calling Member Services.  Call the Behavioral Health Crisis Line at [phone], at any time, 24 hours a day, 7 days a week. If you are in danger or need immediate medical attention call 911.  Please see education materials related to Heart failure self care provided by MyChart link.  Care plan and visit instructions communicated with the patient verbally today. Patient agrees to receive a copy in MyChart. Active MyChart status and patient understanding of how to access instructions and care plan via MyChart confirmed with patient.     Telephone follow up appointment with Managed Medicaid care management team member scheduled for: 06/15/2024 @ 1100 AM   Olam Ku, RN, BSN [place]  [facility], [facility] Health RN Care Manager Direct Dial: [phone]  Fax: [phone]    Following is a copy of your plan of care:   Goals Addressed             This Visit's Progress    VBCI RN Care Plan-CHF   On track    Problems:  Chronic Disease Management support and education needs related to CHF/DM Knowledge Deficits related to CHF/DM Diabetic with a last read on A1C 6.8% 04/11/2024. Patient continues to report she is not monitoring her diabetes but consumes a healthy diet Goal: Over the next 60 days the Patient will continue to work with RN Care Manager  to address care management and care coordination needs related to CHF as evidenced by adherence to care management team scheduled appointments     demonstrate Improved adherence to  prescribed treatment plan for CHF as evidenced by patient reporting and chart review demonstrate Improved health management independence as evidenced by patient reporting and chart review       Continue to encouraged pt to take all medications exactly as prescribed and will call provider for medication related questions as evidenced by patient reporting and chart review  RNCM continue to verbalize basic understanding of CHF disease process and self health management  Patient continues to be encourage pt to follow lowering her dietary intake on sugar limiting drinks high in sugar and dietary monitoring with low carbohydrates related to her diabetes.  Interventions:   Heart Failure Interventions: Basic overview and discussion of pathophysiology of Heart Failure reviewed Provided education on low sodium diet Assessed need for readable accurate scales in home Provided education about placing scale on hard, flat surface Advised patient to weigh each morning after emptying bladder Discussed importance of daily weight and advised patient to weigh and record daily Reviewed role of diuretics in prevention of fluid overload and management of heart failure; Discussed the importance of keeping all appointments with provider Provided patient with education about the role of exercise in the management of heart failure Advised patient to discuss obtaining a scale with provider Educated pt to lower her dietary intake on sugar limiting drinks high in sugar and dietary monitoring with low carbohydrates. Pt aware to report to her provider any increase fatigue/weakness, thrist/hunger, dizziness,blurred vision, or increase in urination educating pt that this is the YELLOW for action with her diabetes if  encountered.  Verified pt remains in the green zone today. Much encouragement for daily weights.  Patient Self-Care Activities:  Attend all scheduled provider appointments Attend church or other social  activities Call provider office for new concerns or questions  Perform all self care activities independently  Perform IADL's (shopping, preparing meals, housekeeping, managing finances) independently Take medications as prescribed   call office if I gain more than 2 pounds in one day or 5 pounds in one week do ankle pumps when sitting keep legs up while sitting and adhere to the compression stocking discussed today use salt in moderation watch for swelling in feet, ankles and legs every day and continue using there request compression stockings as recommended from her provider weigh daily or as often as possible based upon her week appointments at providers offices. follow rescue plan if symptoms flare-up eat more whole grains, fruits and vegetables, lean meats and healthy fats track symptoms and what helps feel better or worse dress right for the weather, hot or cold Report to her provider any symptoms of increase fatigue/weakness, thrist/hunger, dizziness, and blurred vision that maybe related to her diabetes.  Plan:  Telephone follow up appointment with care management team member scheduled for:  06/15/2024 @ 1100 am

## 2024-05-16 NOTE — Patient Outreach (Signed)
 Complex Care Management   Visit Note  05/16/2024  Name:  Chelsea Branch MRN: [id number] DOB: 21-Mar-1966  Situation: Referral received for Complex Care Management related to Heart Failure I obtained verbal consent from Patient.  Visit completed with Patient  on the phone  Background:   Past Medical History:  Diagnosis Date   Abnormal thyroid  blood test 03/15/2017   Abnormal uterine bleeding    Alopecia    Anemia    Angina pectoris with normal coronary arteriogram 2017   Had + Troponin c/w ? 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 iniatially ?2/2 peripartum in 1994 - improved by 2008 then worsening EF in 2011 back down to EF 25-30%. b. echo 01/21/14 showed mod LVH, [name]; 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   Peripartum cardiomyopathy 1994   Sleep apnea 2015   CPAP 12/2013   Stroke (HCC) 2011   Systolic and diastolic CHF, acute on chronic (HCC) 06/14/2015   Tobacco abuse    Type 2 diabetes mellitus (HCC) 04/28/2023    Assessment: Pt inquired on needed resources for housing and supplies. RNCM researched noted and found pt has a [employer] child psychotherapist who has assisted her in the past [place]. Pt was familiar with this person-RNCM encouraged pt to contact her directly with the request for housing vouch and list of [employer] housing to accommodate her requested needs.  No other request at this time. Patient Reported Symptoms:  Cognitive Cognitive Status: Able to follow simple commands, Alert and oriented to  person, place, and time, Normal speech and language skills   Health Maintenance Behaviors: Annual physical exam Health Facilitated by: Pain control  Neurological Neurological Review of Symptoms: No symptoms reported Neurological Management Strategies: Coping strategies, Routine screening Neurological Self-Management Outcome: 4 (good)  HEENT HEENT Symptoms Reported: No symptoms reported HEENT Management Strategies: Routine screening, Coping strategies HEENT Self-Management Outcome: 4 (good)    Cardiovascular Cardiovascular Symptoms Reported: No symptoms reported Does patient have uncontrolled Hypertension?: No Cardiovascular Management Strategies: Coping strategies, Routine screening Weight: 218 lb (98.9 kg) Cardiovascular Self-Management Outcome: 4 (good) Cardiovascular Comment: Pt continues to utilize her compression stockings as recommended by her provider and denies any HF symptoms at this time. Denies any fluctuating weights remaining at her baseline  Respiratory Respiratory Symptoms Reported: No symptoms reported Additional Respiratory Details: Continues to use her nebulizer and inhaler as prescribed Respiratory Management Strategies: Coping strategies, Routine screening, Adequate rest Respiratory Self-Management Outcome: 4 (good)  Endocrine Endocrine Symptoms Reported: No symptoms reported Is patient diabetic?: Yes Is patient checking blood sugars at home?: No (Pt does not check her CBG indicated her provider checks her reading. Last A1C on 04/11/2024 6.8% from 7.4%. Continue to educate on health eating habits and the symptoms of hypo-hyperglycemia)    Gastrointestinal Gastrointestinal Symptoms Reported: No symptoms reported Gastrointestinal Management Strategies: Coping strategies Gastrointestinal Self-Management Outcome: 4 (good)    Genitourinary Genitourinary Symptoms Reported: No symptoms reported Genitourinary Management Strategies: Coping strategies Genitourinary  Self-Management Outcome:  4 (good)  Integumentary Integumentary Symptoms Reported: No symptoms reported Skin Management Strategies: Coping strategies, Routine screening Skin Self-Management Outcome: 4 (good)  Musculoskeletal Musculoskelatal Symptoms Reviewed: No symptoms reported Musculoskeletal Management Strategies: Coping strategies, Routine screening Musculoskeletal Self-Management Outcome: 4 (good)      Psychosocial Psychosocial Symptoms Reported: No symptoms reported Behavioral Management Strategies: Coping strategies Behavioral Health Self-Management Outcome: 4 (good)          Today's Vitals   05/16/24 1014  Weight: 218 lb (98.9 kg)   Pain Scale: 0-10 Pain Score: 0-No pain  Medications Reviewed Today     Reviewed by Alvia Olam BIRCH, RN (Registered Nurse) on 05/16/24 at 1008  Med List Status: <None>   Medication Order Taking? Sig Documenting Provider Last Dose Status Informant  acetaminophen  (TYLENOL ) 500 MG tablet [id number] Yes Take 1,000 mg by mouth as needed for mild pain (pain score 1-3) or moderate pain (pain score 4-6). [provider]  Active Self  albuterol  (PROVENTIL ) (2.5 MG/3ML) 0.083% nebulizer solution [id number] Yes Take 3 mLs (2.5 mg total) by nebulization every 6 (six) hours as needed for wheezing or shortness of breath. Newlin, Enobong, MD  Active   albuterol  (VENTOLIN  HFA) 108 (90 Base) MCG/ACT inhaler [id number] Yes Inhale 2 puffs into the lungs every 4 (four) hours as needed for wheezing or shortness of breath. Newlin, Enobong, MD  Active   aspirin  81 MG chewable tablet [id number] Yes Chew 1 tablet (81 mg total) by mouth daily. Lenard Calin, MD  Active Self  atorvastatin  (LIPITOR) 40 MG tablet [id number] Yes Take 1 tablet (40 mg total) by mouth daily. Newlin, Enobong, MD  Active   benzonatate  (TESSALON ) 200 MG capsule [id number] Yes Take 1 capsule (200 mg total) by mouth 2 (two) times daily as needed for cough. Newlin, Enobong, MD  Active    FLUoxetine  (PROZAC ) 20 MG capsule [id number] Yes Take 1 capsule (20 mg total) by mouth daily. Newlin, Enobong, MD  Active   fluticasone  (FLONASE ) 50 MCG/ACT nasal spray [id number] Yes Place 2 sprays into both nostrils daily. Rising, Asberry, PA-C  Active Self  HYDROcodone  bit-homatropine (HYCODAN) 5-1.5 MG/5ML syrup [id number] Yes Take 5 mLs by mouth every 6 (six) hours as needed for cough. Dreama, [place]  N, FNP  Active   hydrOXYzine  (ATARAX ) 25 MG tablet [id number] Yes Take 1 tablet (25 mg total) by mouth at bedtime as needed. Newlin, Enobong, MD  Active   ketoconazole  (NIZORAL ) 2 % shampoo [id number] Yes Apply 1 Application topically 2 (two) times a week. Newlin, Enobong, MD  Active   loratadine  (CLARITIN ) 10 MG tablet [id number] Yes Take 1 tablet (10 mg total) by mouth daily. Newlin, Enobong, MD  Active   methimazole  (TAPAZOLE ) 5 MG tablet [id number] Yes Take 1 tablet (5 mg total) by mouth 2 (two) times daily. Newlin, Enobong, MD  Active   metoprolol  succinate (TOPROL -XL) 50 MG 24 hr tablet [id number] Yes Take 1.5 tablets (75 mg total) by mouth daily. Take with or immediately following a meal. Thukkani, Arun K, MD  Active   Misc. Devices MISC [id number] Yes Blood Pressure Monitor Delbert Clam, MD  Active Self  polyethylene glycol (MIRALAX  / GLYCOLAX ) 17 g packet [id number] Yes Take 17 g by mouth as needed. [provider]  Active   polyethylene glycol powder (GLYCOLAX /MIRALAX ) 17 GM/SCOOP powder [id number]  Dissolve 17 grams into water and drink by mouth every day.   Active Self  potassium chloride  SA (KLOR-CON  M20) 20 MEQ tablet [id number] Yes Take 3 tablets (60 mEq total)  by mouth daily. Kennyth Chew, MD  Active   sacubitril -valsartan  (ENTRESTO ) 49-51 MG [id number] Yes Take 1 tablet by mouth 2 (two) times daily. Thukkani, Arun K, MD  Active   Semaglutide , 2 MG/DOSE, 8 MG/3ML SOPN [id number] Yes Inject 2 mg as directed once a week. Newlin, Enobong, MD  Active   spironolactone  (ALDACTONE ) 25 MG  tablet [id number] Yes Take 1 tablet (25 mg total) by mouth daily. Thukkani, Arun K, MD  Active   torsemide  (DEMADEX ) 20 MG tablet [id number] Yes Take 1 tablet (20 mg total) by mouth daily. Newlin, Enobong, MD  Active   triamcinolone  cream (KENALOG ) 0.1 % [id number] Yes Apply 1 Application topically 2 (two) times daily. Newlin, Enobong, MD  Active Self  Vitamin D , Ergocalciferol , (DRISDOL ) 1.25 MG (50000 UNIT) CAPS capsule [id number] Yes Take 1 capsule (50,000 Units total) by mouth every 7 (seven) days. Vicci Barnie NOVAK, MD  Active   Med List Note Beverlee Reyes LELON Bishop 04/09/17 2221):              Recommendation:   PCP Follow-up Continue Current Plan of Care  Follow Up Plan:   Telephone follow up appointment date/time:  06/15/2024 @ 1100 am   Olam Ku, RN, BSN [place]  [facility], [facility] RN Care Manager Direct Dial: [phone]  Fax: [phone]

## 2024-05-22 ENCOUNTER — Ambulatory Visit
Admission: RE | Admit: 2024-05-22 | Discharge: 2024-05-22 | Disposition: A | Discharge status: Home / Self Care | Source: Ambulatory Visit | Attending: Critical Care Medicine | Admitting: Critical Care Medicine

## 2024-05-22 ENCOUNTER — Other Ambulatory Visit: Payer: Self-pay

## 2024-05-22 DIAGNOSIS — Z1231 Encounter for screening mammogram for malignant neoplasm of breast: Secondary | ICD-10-CM

## 2024-05-23 ENCOUNTER — Other Ambulatory Visit: Payer: Self-pay

## 2024-05-23 ENCOUNTER — Ambulatory Visit: Admitting: Occupational Therapy

## 2024-05-23 DIAGNOSIS — M6281 Muscle weakness (generalized): Secondary | ICD-10-CM

## 2024-05-23 DIAGNOSIS — R29818 Other symptoms and signs involving the nervous system: Secondary | ICD-10-CM

## 2024-05-23 DIAGNOSIS — R208 Other disturbances of skin sensation: Secondary | ICD-10-CM

## 2024-05-23 DIAGNOSIS — R278 Other lack of coordination: Secondary | ICD-10-CM

## 2024-05-23 NOTE — Patient Instructions (Signed)
 Chelsea Branch

## 2024-05-23 NOTE — Therapy (Signed)
 " OUTPATIENT OCCUPATIONAL THERAPY NEURO TREATMENT & DISCHARGE SUMMARY  Patient Name: Chelsea Branch MRN: [id number] DOB:27-Feb-1966, 58 y.o., female Today's Date: 05/23/2024  PCP: Delbert Clam, MD  REFERRING PROVIDER: Delbert Clam, MD  END OF SESSION:  OT End of Session - 05/23/24 1230     Visit Number 3    Number of Visits 5    Date for Recertification  05/23/24    Authorization Type [facility] Medicaid 2025    Authorization Time Period VL: 27 AUTH NOT REQUIRED    OT Start Time 1230    OT Stop Time 1315    OT Time Calculation (min) 45 min    Equipment Utilized During Treatment FM objects    Activity Tolerance Patient tolerated treatment well    Behavior During Therapy WFL for tasks assessed/performed          Past Medical History:  Diagnosis Date   Abnormal thyroid  blood test 03/15/2017   Abnormal uterine bleeding    Alopecia    Anemia    Angina pectoris with normal coronary arteriogram 2017   Had + Troponin c/w ? 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 iniatially ?2/2 peripartum in 1994 - improved by 2008 then worsening EF in 2011 back down to EF 25-30%. b. echo 01/21/14 showed mod LVH, [name]; 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   Peripartum cardiomyopathy 1994   Sleep apnea 2015   CPAP 12/2013   Stroke (HCC) 2011   Systolic and diastolic CHF, acute on chronic (HCC) 06/14/2015   Tobacco abuse    Type 2 diabetes mellitus (HCC) 04/28/2023   Past Surgical History:  Procedure  Laterality Date   CARDIAC CATHETERIZATION N/A 06/16/2015   Procedure: Left Heart Cath and Coronary Angiography;  Surgeon: Candyce GORMAN Reek, MD;  Location: [facility] INVASIVE CV LAB;  Service: Cardiovascular;  Laterality: N/A;   CESAREAN SECTION  1992  1994   ICD IMPLANT N/A 01/30/2024   Procedure: ICD IMPLANT;  Surgeon: Kennyth Chew, MD;  Location: [facility] INVASIVE CV LAB;  Service: Cardiovascular;  Laterality: N/A;   INTRAUTERINE DEVICE (IUD) INSERTION     about 3 years ago   LOOP RECORDER IMPLANT  ~ 2000   spine injections      [facility],  Dr Darlis   TIBIAL TUBERCLERPLASTY     TRANSESOPHAGEAL ECHOCARDIOGRAM (CATH LAB) N/A 04/20/2023   Procedure: TRANSESOPHAGEAL ECHOCARDIOGRAM;  Surgeon: Alvan Ronal BRAVO, MD;  Location: [facility] INVASIVE CV LAB;  Service: Cardiovascular;  Laterality: N/A;   TUBAL LIGATION  05/24/1992   Patient Active Problem List   Diagnosis Date Noted   Dependent edema 02/21/2024   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   History of stroke with residual effects 07/06/2023   Frequent falls 07/06/2023   Type 2 diabetes mellitus (HCC) 04/28/2023   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) 04/28/2023   History of cerebellar stroke 04/13/2023   History of stroke 04/11/2023   Liletta  IUD (intrauterine device) in place since  12/15/2015 04/06/2023   Chronic pain syndrome 12/28/2021   Primary osteoarthritis of both knees 12/28/2021   Spinal stenosis of lumbar region 07/21/2021   Lumbar radiculitis 07/21/2021   COPD (chronic obstructive pulmonary disease) (HCC) 07/21/2021   Mechanical complication due to intrauterine contraceptive device 07/21/2021   Arthropathy of lumbar facet joint 03/14/2019   Degenerative disc disease at L5-S1 level 10/10/2018   Hyperthyroidism 04/26/2016   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   History of non-ST elevation myocardial infarction (NSTEMI) 06/14/2015   Homelessness 09/16/2014   Vitamin D  deficiency  01/29/2014   OSA (obstructive sleep apnea) 01/08/2014   Chronic combined systolic and diastolic CHF (congestive heart failure) (HCC) 08/14/2013   Essential hypertension    Fibroids 08/09/2012   Menorrhagia 05/08/2012   Female pattern hair loss    HLD (hyperlipidemia) 06/08/2010   CEREBRAL ANEURYSM 06/08/2010   History of cardiovascular disorder 06/08/2010    ONSET DATE: 04/11/2024 (referral)   REFERRING DIAG: I69.30 (ICD-10-CM) - History of stroke with residual effects   THERAPY DIAG:  Muscle weakness (generalized)  Other lack of coordination  Other symptoms and signs involving the nervous system  Other disturbances of skin sensation  Rationale for Evaluation and Treatment: Rehabilitation  SUBJECTIVE:   SUBJECTIVE STATEMENT:  Pt reports she was able to speak with her nurse care coordinator before Christmas.  She reports she has an appt for her vision and OT provided additional printout of upcoming appts ie) for Thyroid  and defibrillator check.  Pt reports she knows she needs to work on getting the application for [employer] completed for ease of transportation in the community as well as getting set up at the [facility] to be able to work out and find what activities are available for her age.   Pt accompanied by: self  PERTINENT HISTORY: Most recent procedure: implantable cardioverter-defibrillator 01/30/24  PMHx: NSTEMI in 2017, NICM (EF 34%, LV global hypokinesis, LVH, dilated LV, grade 1 DD, mild to moderate MR from echo of 05/2023 status post ICD placement in 01/2024), hypertension, OSA, right occipital and left cerebellar hemispheric CVA 04/20/23 status post TNK therapy with residual dysarthria, hyperthyroidism, type 2 diabetes mellitus   PRECAUTIONS: Fall  WEIGHT BEARING RESTRICTIONS: No  PAIN:  Are you having pain? No   FALLS: Has patient fallen in last 6 months? No - 'stumbles' but no falls - does report getting injections in her back.  LIVING  ENVIRONMENT: Lives with: Pt reports varied living situations ie) staying at shelters at times ie) 7 pm - 7 am; may stay at the [facility] and mentioned she has not been staying with friends lately but might call one tonight Lives in: Pt reports she has some belongings at her mom's place.  She gets her check on 2nd/3rd and reports needing to get a storage place for her belongings. Stairs: No Has following equipment at home: Vannie - 4 wheeled Rollator  PLOF: Requires assistive device for independence since stroke.  Previously worked in a chemical engineer; does not have a car to drive; Previously liked reading (still reads her Bible); writing; laundry; crocheting (but does not have access to this)  PATIENT GOALS: Pt wants to get into a home of her own; she wants to be able to get away from her walker - has been using it since last year.  She wants to use her hands better and get stronger ie) has been dropping things with her R hand more.  OBJECTIVE:  Note: Objective measures were completed at Evaluation  unless otherwise noted.  HAND DOMINANCE: Right  ADLs: Overall ADLs: Mod Ind  Equipment: N/A.  Pt previously had a shower chair but lost her storage unit and is homeless She has to stand in the shower and has stumbled but not fallen  IADLs: Homeless   Laundry - may go to [employer] and sits in her rollator to perform tasks  Meals: Might make a salad - reports she can cut up things, also reports liking baked chicken etc Pt has to sit for other light housekeeping and take breaks in between  Handwriting: NT  MOBILITY STATUS: Needs Assist: Ambulates with use of rollator  ACTIVITY TOLERANCE: Activity tolerance: fair to poor  FUNCTIONAL OUTCOME MEASURES: 11/29/23: Junie Dash: 40.9 % disability 01/25/24: 4.5 % disability with use of BUE  05/01/24: 25%    UPPER EXTREMITY ROM:    BUE: WNL  UPPER EXTREMITY MMT:     BUE: WFL though poor endurance  HAND FUNCTION: 11/29/23: Grip  strength: Right: 37.2 lbs; Left: 36.5 lbs  05/01/24: Right: 17.8, 19.8, 18.9 lbs Left:  24.2, 22.9, 21.1 lbs Average: Right - 18.5 lbs; Left - 22.7 lbs  05/23/24: Right 16.7, 21.1, 18.5 Left 28.6, 26.0, 23.5,  Average: Right - 18.8 lbs; Left - 26.0 lbs  COORDINATION: 11/29/23: 9 Hole Peg test: Right: 24 sec; Left: 25 sec  05/01/24: Right: 38.63 sec Left: 29.46 sec  05/23/24: Right: 30.74 sec Left: 27.81 sec  SENSATION: 11/29/23: Paresthesias in RUE from previous work-related accident  05/01/24: Reports some tingling in R hand ~ 3x/week - midday.  05/23/24: Continues to have tingling in the PM ~ 3 PM  EDEMA: mild reported and observed in RUE  MUSCLE TONE: WFL  COGNITION: Overall cognitive status: Impaired  VISION: Subjective report: Pt continues to report difficulty seeing items on left side and has spots in vision that are not clear. She turns head to left side to be able to see things. Baseline vision: Still does not have corrected lenses Visual history: Thyroid  eye disease  VISION ASSESSMENT: Previous assessment: Reading and distance acuity: WFL 45* left peripheral vision; R peripheral vision WFL  PERCEPTION: 11/26/23: Impaired: Spatial orientation: unable to orient to dry erase marker; reports difficulty with writing to make letters correctly  PRAXIS: Impaired: Motor planning  OBSERVATIONS: Pt ambulates with use of rollator. No loss of balance. The pt appears fairly well kept.                                                                                                                           TODAY'S TREATMENT :    - Self Care education and training completed for duration as noted below including:  OT provided discharge education emphasizing the importance of staying physically active and continuing regular use of therapeutic activities to support ongoing recovery. Patient was instructed to engage in putty exercises, coordination tasks, and functional hand activities to  continue improving grip strength, which remains below PLOF but is demonstrating steady improvement.  Education also included the role of varied sensory input (e.g., texture, resistance, weight-bearing) to promote sensory awareness and motor activation of the affected extremity. Pt assisted to get information about community programs such as [facility] which would also help with staying active.  Patient verbalized understanding and was encouraged to incorporate these activities into daily routines for continued progress.  - Therapeutic activities completed for duration as noted below including:  Reviewed and practiced various Coordination Activities with handout with images provided for various activities to work on B UE finger ROM, dexterity and isolated movements with demonstration and practice, as well as modification, hand over hand guidance and cues throughout to improve technique, digital isolation and ease of performing task.  Tasks included:  Pick up coins, dominoes, buttons, marbles, dice and objects of different sizes ... To place in containers To stack - with guidance to work on include/isolate specific fingers. To pick up items one at a time until patient got 5+ in their hand and then move item from palm to fingertips to release ie) Finger-to-palm then palm-to-finger translation of small items - Options to vary difficulty include using a washcloth under items like coins or using larger items (checkers vs coins or blocks/dominos vs dice) for increased ease of picking up items.  OTR provided pt with slit tennis ball to work on grip strength along with 6 small wooden blocks marked to be dice to work on in grip strength, in hand manipulation etc ie) pt able to squeeze ball with R hand while putting small 'dice' inside ball, also able to collect 6 'dice' in R hand and then release them 1 at a time in numerical order by turing them before placing them on the table.  Shuffling,  Flipping and dealing cards 1 at a time. -- Pt encouraged to work on sorting cards, focusing on using index finger with thumb to flip cards or holding deck of cards in palm of hand and push off 1 card at a time from the top of the deck using only thumb    Other activities on list included: Rotate golf balls, twirl pen/cil between fingers and tear a piece of paper towel and roll it into small balls with fingertips ie) straw wrapping when eating out.    Patient is encouraged to take breaks, relax arm/shoulder by supporting forearm, minimize compensatory motions and a try different activities throughout the day/week including games like Londa (for the dice), card games, Connect 4 etc.   Patient benefited from extra time, verbal/tactile cues, and modeling of task to allow time for processing of verbal instructions and improve motor planning of unfamiliar movements.   - Therapeutic exercises completed for duration as noted below including:  Pt issued tendon gliding exercises/handout with review of motions to isolate DIP, PIP and MCP joints for straight finger position, hook (DIP/PIP flexion), fist (DIP/PIP/MCP flexion), taco/duck (MCP flexion only) and flat fist (MCP and PIP flexion). Education provided on purpose of tendon glide exercises ie) to increase the circulation to the hand and wrist, reduce swelling and promote healthier soft tissue for increased AROM and decreased sensory changes especially to RUE not particularly to build hand or wrist strength at this time.  PATIENT EDUCATION: Education details: Coordination and tendon glides Person educated: Patient Education method: Explanation, Demonstration, Tactile cues, Verbal cues, and Handouts Education comprehension: verbalized understanding, returned demonstration, verbal cues required, and tactile cues required  HOME EXERCISE PROGRAM: 12/19/23: Visual Scanning and Visual Compensation 12/27/2023: golf solitaire; EC strategies; putty  HEP 01/02/2024: sleep hygiene 01/17/24: coordination activities  05/08/24: Reissued golf Solitaire and new Putty HEP Access Code: [email or web address] 05/23/24: Reissued Coordination activities and provided tendon glides  GOALS:  LONG TERM GOALS: Target date: 05/23/2024    Patient will demonstrate UE HEP with visual handouts only for proper execution. Baseline: Returning to outpt OT with decreased UE strength (15-20 lbs) and coordination. Goal status: MET 05/01/24: Average: Right - 18.5 lbs; Left - 22.7 lbs  2.  Patient will demonstrate at least 30 lbs B grip strength as needed to open jars and other containers. Baseline: Average: Right - 18.5 lbs; Left - 22.7 lbs Goal status: IN Progress  05/23/24: Right 16.7, 21.1, 18.5 Left 28.6, 26.0, 23.5,  Average: Right - 18.8 lbs; Left - 26.0 lbs  3.  Patient will independently recall and utilize at least 2 community resources for accessing daily and social needs. Baseline: Homeless with limited resources Goal status: IN Progress 05/23/24: Pt able to report [employer], [employer], and community programs such as [employer]  ASSESSMENT:  CLINICAL IMPRESSION: Patient is a 58 y.o. female who was seen today for occupational therapy evaluation for h/o CVA and recent defibrillator placement. Reviewed HEP ideas with pt who has not achieved PLOF with strength but has available resources to continue strengthening at home. Patient is appropriate for discharge and no longer demonstrates medical necessity for continued skilled occupational therapy services. PERFORMANCE DEFICITS: in functional skills including ADLs, IADLs, coordination, sensation, edema, strength, Fine motor control, mobility, endurance, decreased knowledge of use of DME, vision, and UE functional use, cognitive skills including orientation and perception, and psychosocial skills including coping strategies, environmental adaptation, and routines and behaviors.   IMPAIRMENTS: are limiting patient from ADLs,  IADLs, rest and sleep, work, and leisure.   CO-MORBIDITIES: has co-morbidities such as NICM and COPD that affects occupational performance. Patient will benefit from skilled OT to address above impairments and improve overall function.  REHAB POTENTIAL: Good for goals stated  PLAN:  OCCUPATIONAL THERAPY DISCHARGE SUMMARY  Visits from Start of Care: 3 - pt missed 1 visit  Current functional level related to goals / functional outcomes: Pt has met 2/3 goals to satisfactory level.   Remaining deficits: Pt has some ongoing functional deficits in strength and sensory changes but has resources to address issues at home.   Education / Equipment: Pt has all needed materials and education. Pt understands how to continue on with self-management. See tx notes for more details.   Patient agrees to discharge due to max benefits received from outpatient occupational therapy at this time but is aware of option to resume therapy in the future if something changes, by obtaining a new referral to OT.    Clarita LITTIE Pride, OT 05/23/2024, 12:31 PM          "

## 2024-05-25 ENCOUNTER — Other Ambulatory Visit: Payer: Self-pay

## 2024-05-26 ENCOUNTER — Ambulatory Visit: Payer: Self-pay | Admitting: Critical Care Medicine

## 2024-05-26 NOTE — Progress Notes (Signed)
 Pls call the patient and let her know mammogram is normal. Repeat one year

## 2024-05-29 ENCOUNTER — Encounter: Payer: Self-pay | Admitting: Endocrinology

## 2024-05-29 ENCOUNTER — Other Ambulatory Visit

## 2024-05-29 ENCOUNTER — Ambulatory Visit (INDEPENDENT_AMBULATORY_CARE_PROVIDER_SITE_OTHER): Admitting: Endocrinology

## 2024-05-29 VITALS — BP 136/88 | HR 97 | Resp 16 | Ht 68.0 in | Wt 212.2 lb

## 2024-05-29 DIAGNOSIS — E0590 Thyrotoxicosis, unspecified without thyrotoxic crisis or storm: Secondary | ICD-10-CM | POA: Diagnosis not present

## 2024-05-29 NOTE — Progress Notes (Signed)
 "  Outpatient Endocrinology Note Tameisha Covell, MD   Patient's Name: Chelsea Branch    DOB: 1966-01-22    MRN: [id number]  REASON OF VISIT: New consult for hyperthyroidism  REFERRING PROVIDER: Delbert Clam, MD    PCP: Delbert Clam, MD  HISTORY OF PRESENT ILLNESS:   Chelsea Branch is a 59 y.o. old female with past medical history as listed below is presented for new consult for hyperthyroidism.   Pertinent Thyroid  History: Patient is referred to endocrinology for evaluation and management of hyperthyroidism.  Initial consult on May 29, 2024.  Patient was diagnosed with hyperthyroidism based on low TSH of 0.130, elevated total T3 of 197 with upper normal limit of 180 and normal free T4 of 1.46.  Patient had mildly low TSH few times in 2024.  She had intermittently low TSH in the past in 2015 and 2017 and most of the other times normal TSH and normal free T4.  Patient was started on methimazole  5 mg 2 times a day in December 2024.  Patient has normalization of thyroid  function test including TSH after being on methimazole .  Thyroid  function test in August 2025 was normal.  Patient had normal thyroid  peroxidase and thyroglobulin antibody.  No autoantibodies for Graves' disease was available to review.  Patient had CT scan with iodine  contrast in April 11, 2023, for evaluation of his stroke, patient had a history of a stroke in November 2024.  Patient had mildly low TSH prior to IV iodine  contrast use in 2024.  CT in September 2024 with no obvious abnormality of the thyroid  gland.  Patient has type 2 diabetes mellitus, on Ozempic , managed by primary care provider.  Patient denies palpitation, heat intolerance.  She is concerned about weight gain.   Latest Reference Range & Units 01/04/24 16:34  TSH 0.450 - 4.500 uIU/mL 0.937  Triiodothyronine (T3) 71 - 180 ng/dL 851    Interval history  Initial visit today.  Patient is currently taking methimazole  5 mg 2 times a day.  REVIEW  OF SYSTEMS:  As per history of present illness.   PAST MEDICAL HISTORY: Past Medical History:  Diagnosis Date   Abnormal thyroid  blood test 03/15/2017   Abnormal uterine bleeding    Alopecia    Anemia    Angina pectoris with normal coronary arteriogram 2017   Had + Troponin c/w ? 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 iniatially ?2/2 peripartum in 1994 - improved by 2008 then worsening EF in 2011 back down to EF 25-30%. b. echo 01/21/14 showed mod LVH, [name]; 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   Peripartum cardiomyopathy 1994   Sleep apnea 2015   CPAP 12/2013   Stroke (HCC) 2011   Systolic and diastolic CHF, acute on chronic (HCC) 06/14/2015   Tobacco abuse    Type 2 diabetes mellitus (HCC) 04/28/2023    PAST SURGICAL HISTORY: Past Surgical History:  Procedure Laterality Date   CARDIAC CATHETERIZATION N/A 06/16/2015   Procedure:  Left Heart Cath and Coronary Angiography;  Surgeon: Candyce GORMAN Reek, MD;  Location: [facility] INVASIVE CV LAB;  Service: Cardiovascular;  Laterality: N/A;   CESAREAN SECTION  1992  1994   ICD IMPLANT N/A 01/30/2024   Procedure: ICD IMPLANT;  Surgeon: Kennyth Chew, MD;  Location: [facility] INVASIVE CV LAB;  Service: Cardiovascular;  Laterality: N/A;   INTRAUTERINE DEVICE (IUD) INSERTION     about 3 years ago   LOOP RECORDER IMPLANT  ~ 2000   spine injections      [facility],  Dr Darlis   TIBIAL TUBERCLERPLASTY     TRANSESOPHAGEAL ECHOCARDIOGRAM (CATH LAB) N/A  04/20/2023   Procedure: TRANSESOPHAGEAL ECHOCARDIOGRAM;  Surgeon: Alvan Ronal BRAVO, MD;  Location: [facility] INVASIVE CV LAB;  Service: Cardiovascular;  Laterality: N/A;   TUBAL LIGATION  05/24/1992    ALLERGIES: Allergies[1]  FAMILY HISTORY:  Family History  Problem Relation Age of Onset   Healthy Mother    Stroke Sister    Hypertension Sister    Cancer Maternal Grandmother        uterine   Other Neg Hx    Heart disease Neg Hx     SOCIAL HISTORY: Social History   Socioeconomic History   Marital status: Single    Spouse name: Not on file   Number of children: 3   Years of education: 12   Highest education level: Not on file  Occupational History   Occupation: unemployed  Tobacco Use   Smoking status: Former    Current packs/day: 0.00    Average packs/day: 0.1 packs/day for 30.0 years (3.0 ttl pk-yrs)    Types: Cigarettes    Start date: 06/02/1985    Quit date: 06/03/2015    Years since quitting: 8.9   Smokeless tobacco: Never   Tobacco comments:    Pt. stated she stopped smoking a year ago. 09/29/2018  Vaping Use   Vaping status: Never Used  Substance and Sexual Activity   Alcohol use: No    Alcohol/week: 0.0 standard drinks of alcohol   Drug use: No   Sexual activity: Yes    Birth control/protection: I.U.D.    Comment: fibroids  Other Topics Concern   Not on file  Social History Narrative   Lives at home with 60 yo twins Dawn and [place])   31 yo son lives outside the home   34 yo granddaughter       Update 07/12/2023   Stays with her mother, sister, friend here and there, lost her home, signing up for social security, has been sleeping in her car but she doesn't have it anymore. She does not want to go to a shelter. She was working in [employer] work until she had her stroke in November 2024. She gets food stamps and medicaid.   Social Drivers of Health   Tobacco Use: Medium Risk (05/29/2024)   Patient History    Smoking Tobacco Use: Former    Smokeless  Tobacco Use: Never    Passive Exposure: Not on file  Financial Resource Strain: High Risk (12/23/2023)   Overall Financial Resource Strain (CARDIA)    Difficulty of Paying Living Expenses: Very hard  Food Insecurity: Food Insecurity Present (05/16/2024)   Epic    Worried About Programme Researcher, Broadcasting/film/video in the Last Year: Sometimes true    Ran Out of Food in the Last Year: Sometimes true  Transportation Needs: No Transportation Needs (05/16/2024)   Epic    Lack of Transportation (Medical): No  Lack of Transportation (Non-Medical): No  Physical Activity: Not on file  Stress: Stress Concern Present (07/06/2023)   [employer] of Occupational Health - Occupational Stress Questionnaire    Feeling of Stress : Rather much  Social Connections: Moderately Integrated (07/06/2023)   Social Connection and Isolation Panel    Frequency of Communication with Friends and Family: Three times a week    Frequency of Social Gatherings with Friends and Family: Once a week    Attends Religious Services: More than 4 times per year    Active Member of Clubs or Organizations: No    Attends Banker Meetings: Never    Marital Status: Living with partner  Depression (PHQ2-9): Medium Risk (04/11/2024)   Depression (PHQ2-9)    PHQ-2 Score: 9  Alcohol Screen: Low Risk (07/06/2023)   Alcohol Screen    Last Alcohol Screening Score (AUDIT): 0  Housing: High Risk (05/16/2024)   Epic    Unable to Pay for Housing in the Last Year: No    Number of Times Moved in the Last Year: 3    Homeless in the Last Year: No  Utilities: Not At Risk (05/16/2024)   Epic    Threatened with loss of utilities: No  Health Literacy: Adequate Health Literacy (03/19/2023)   B1300 Health Literacy    Frequency of need for help with medical instructions: Never    MEDICATIONS:  Current Outpatient Medications  Medication Sig Dispense Refill   acetaminophen  (TYLENOL ) 500 MG tablet Take 1,000 mg by mouth as needed for mild pain  (pain score 1-3) or moderate pain (pain score 4-6).     albuterol  (PROVENTIL ) (2.5 MG/3ML) 0.083% nebulizer solution Take 3 mLs (2.5 mg total) by nebulization every 6 (six) hours as needed for wheezing or shortness of breath. 75 mL 12   albuterol  (VENTOLIN  HFA) 108 (90 Base) MCG/ACT inhaler Inhale 2 puffs into the lungs every 4 (four) hours as needed for wheezing or shortness of breath. 18 g 1   aspirin  81 MG chewable tablet Chew 1 tablet (81 mg total) by mouth daily. 30 tablet 1   atorvastatin  (LIPITOR) 40 MG tablet Take 1 tablet (40 mg total) by mouth daily. 90 tablet 1   benzonatate  (TESSALON ) 200 MG capsule Take 1 capsule (200 mg total) by mouth 2 (two) times daily as needed for cough. 20 capsule 0   FLUoxetine  (PROZAC ) 20 MG capsule Take 1 capsule (20 mg total) by mouth daily. 90 capsule 1   fluticasone  (FLONASE ) 50 MCG/ACT nasal spray Place 2 sprays into both nostrils daily. 9.9 mL 2   HYDROcodone  bit-homatropine (HYCODAN) 5-1.5 MG/5ML syrup Take 5 mLs by mouth every 6 (six) hours as needed for cough. 120 mL 0   hydrOXYzine  (ATARAX ) 25 MG tablet Take 1 tablet (25 mg total) by mouth at bedtime as needed. 30 tablet 1   ketoconazole  (NIZORAL ) 2 % shampoo Apply 1 Application topically 2 (two) times a week. 120 mL 1   loratadine  (CLARITIN ) 10 MG tablet Take 1 tablet (10 mg total) by mouth daily. 30 tablet 11   methimazole  (TAPAZOLE ) 5 MG tablet Take 1 tablet (5 mg total) by mouth 2 (two) times daily. 180 tablet 1   metoprolol  succinate (TOPROL -XL) 50 MG 24 hr tablet Take 1.5 tablets (75 mg total) by mouth daily. Take with or immediately following a meal. 135 tablet 1   Misc. Devices MISC Blood Pressure Monitor 1 each 0   polyethylene glycol (MIRALAX  / GLYCOLAX ) 17 g packet Take 17  g by mouth as needed.     polyethylene glycol powder (GLYCOLAX /MIRALAX ) 17 GM/SCOOP powder Dissolve 17 grams into water and drink by mouth every day. 3570 g 1   potassium chloride  SA (KLOR-CON  M20) 20 MEQ tablet Take 3  tablets (60 mEq total) by mouth daily. 180 tablet 1   sacubitril -valsartan  (ENTRESTO ) 49-51 MG Take 1 tablet by mouth 2 (two) times daily. 90 tablet 3   Semaglutide , 2 MG/DOSE, 8 MG/3ML SOPN Inject 2 mg as directed once a week. 3 mL 1   spironolactone  (ALDACTONE ) 25 MG tablet Take 1 tablet (25 mg total) by mouth daily. 90 tablet 1   torsemide  (DEMADEX ) 20 MG tablet Take 1 tablet (20 mg total) by mouth daily. 90 tablet 1   triamcinolone  cream (KENALOG ) 0.1 % Apply 1 Application topically 2 (two) times daily. 30 g 0   Vitamin D , Ergocalciferol , (DRISDOL ) 1.25 MG (50000 UNIT) CAPS capsule Take 1 capsule (50,000 Units total) by mouth every 7 (seven) days. 12 capsule 1   No current facility-administered medications for this visit.    PHYSICAL EXAM: Vitals:   05/29/24 1112  BP: 136/88  Pulse: 97  Resp: 16  SpO2: 96%  Weight: 212 lb 3.2 oz (96.3 kg)  Height: 5' 8 (1.727 m)   Body mass index is 32.26 kg/m.  Wt Readings from Last 3 Encounters:  05/29/24 212 lb 3.2 oz (96.3 kg)  05/16/24 218 lb (98.9 kg)  04/16/24 219 lb 6.4 oz (99.5 kg)    General: Well developed, well nourished female in no apparent distress.  HEENT: AT/[place], no external lesions. Hearing intact to the spoken word Eyes: EOMI. No stare, ? Mild proptosis or lid lag. Conjunctiva clear and no icterus. No erythema or watering Neck: Trachea midline, neck supple without appreciable thyromegaly or lymphadenopathy and no palpable thyroid  nodules Lungs: Clear to auscultation, no wheeze. Respirations not labored Heart: S1S2, Regular in rate and rhythm.  Abdomen: Soft, non tender, non distended Neurologic: Alert, oriented, normal speech, deep tendon biceps reflexes normal,  no gross focal neurological deficit Extremities: No pedal pitting edema, no tremors of outstretched hands Skin: Warm, color good.  Psychiatric: Does not appear depressed or anxious  PERTINENT HISTORIC LABORATORY AND IMAGING STUDIES:  All pertinent laboratory  results were reviewed. Please see HPI also for further details.   TSH  Date Value Ref Range Status  01/04/2024 0.937 0.450 - 4.500 uIU/mL Final  06/21/2023 1.320 0.450 - 4.500 uIU/mL Final  04/28/2023 0.130 (L) 0.450 - 4.500 uIU/mL Final   T3, Total  Date Value Ref Range Status  01/04/2024 148 71 - 180 ng/dL Final  87/94/7975 802 (H) 71 - 180 ng/dL Final    Lab Results  Component Value Date   FREET4 1.15 01/04/2024   FREET4 1.12 06/21/2023   FREET4 1.46 04/28/2023   T3FREE 3.1 06/21/2023   T3FREE 3.3 11/22/2022   T3FREE 3.4 04/26/2016   TSH 0.937 01/04/2024   TSH 1.320 06/21/2023   TSH 0.130 (L) 04/28/2023    No results found for: THYROTRECAB  Lab Results  Component Value Date   TSH 0.937 01/04/2024   TSH 1.320 06/21/2023   TSH 0.130 (L) 04/28/2023   FREET4 1.15 01/04/2024   FREET4 1.12 06/21/2023   FREET4 1.46 04/28/2023     No results found for: TSI   No components found for: TRAB    ASSESSMENT / PLAN  1. Hyperthyroidism    Patient has hyperthyroidism.  She had intermittently low to TSH in the past  starting 2015/2017 timeframe, in December 2024 she had low TSH with elevated total T3 consistent with hyperthyroidism and started on methimazole .  She is currently taking methimazole  5 mg 2 times a day.  She has normalization of thyroid  function test after being on methimazole .  Most recent thyroid  function test was normal in August 2025.  Etiology of hyperthyroidism is unclear at this time.  It can be autoimmune hyperthyroidism/Graves' disease versus hyperfunctioning thyroid  nodule versus ?  IV iodine  contrast induced.  Plan: - I would like to check thyroid  function test today. - Check thyroid  autoantibodies for Graves' disease including TRAb and TSI. - Consider ultrasound thyroid .  If needed we will plan for radioactive iodine  uptake and scan if she remained hyperthyroid, no plan for neck radiation at this time. - Rest of the plan based on test results from  today.  Provided test results are normal will consider tapering methimazole  in the future.   Diagnoses and all orders for this visit:  Hyperthyroidism -     T3, free -     T4, free -     TSH -     TRAb (TSH Receptor Binding Antibody) -     Thyroid  stimulating immunoglobulin    DISPOSITION Follow up in clinic in 2 months suggested.  Labs today and prior to follow-up visit.  All questions answered and patient verbalized understanding of the plan.  Wendy Hoback, MD [facility] Endocrinology [facility] [address] [place], [address] [place], [place] [address] Phone # [phone]   At least part of this note was generated using voice recognition software. Inadvertent word errors may have occurred, which were not recognized during the proofreading process.     [1]  Allergies Allergen Reactions   Ace Inhibitors Cough   Jardiance  [Empagliflozin ] Rash    Yeast infection/rash/UTI   "

## 2024-05-30 ENCOUNTER — Telehealth: Payer: Self-pay

## 2024-05-30 NOTE — Telephone Encounter (Signed)
 Would patient qualify for PCS.

## 2024-05-31 ENCOUNTER — Telehealth: Payer: Self-pay

## 2024-05-31 NOTE — Telephone Encounter (Signed)
 She qualifies due to history of stroke.

## 2024-05-31 NOTE — Telephone Encounter (Signed)
 Patient completed Part A of [employer] [place] application in the office yesterday.   Part B has been completed and the application was emailed to [employer]

## 2024-06-01 ENCOUNTER — Ambulatory Visit: Attending: Physician Assistant | Admitting: Physician Assistant

## 2024-06-01 VITALS — BP 128/90 | HR 88 | Ht 68.0 in | Wt 210.0 lb

## 2024-06-01 DIAGNOSIS — I428 Other cardiomyopathies: Secondary | ICD-10-CM | POA: Diagnosis present

## 2024-06-01 DIAGNOSIS — Z95810 Presence of automatic (implantable) cardiac defibrillator: Secondary | ICD-10-CM | POA: Diagnosis present

## 2024-06-01 DIAGNOSIS — I5022 Chronic systolic (congestive) heart failure: Secondary | ICD-10-CM | POA: Insufficient documentation

## 2024-06-01 LAB — CUP PACEART INCLINIC DEVICE CHECK
Date Time Interrogation Session: 20260109124120
HighPow Impedance: 76 Ohm
Implantable Lead Connection Status: 753985
Implantable Lead Implant Date: 20250908
Implantable Lead Location: 753860
Implantable Lead Model: 673
Implantable Lead Serial Number: 271980
Implantable Pulse Generator Implant Date: 20250908
Lead Channel Impedance Value: 450 Ohm
Lead Channel Pacing Threshold Amplitude: 0.6 V
Lead Channel Pacing Threshold Pulse Width: 0.4 ms
Lead Channel Sensing Intrinsic Amplitude: 25 mV
Lead Channel Setting Pacing Amplitude: 3.5 V
Lead Channel Setting Pacing Pulse Width: 0.4 ms
Lead Channel Setting Sensing Sensitivity: 0.5 mV
Pulse Gen Serial Number: 354500
Zone Setting Status: 755011

## 2024-06-01 LAB — TSH: TSH: 9.67 m[IU]/L — ABNORMAL HIGH (ref 0.40–4.50)

## 2024-06-01 LAB — TRAB (TSH RECEPTOR BINDING ANTIBODY): TRAB: <1 IU/L

## 2024-06-01 LAB — THYROID STIMULATING IMMUNOGLOBULIN: TSI: <89 %{baseline}

## 2024-06-01 LAB — T3, FREE: T3, Free: 3 pg/mL (ref 2.3–4.2)

## 2024-06-01 LAB — T4, FREE: Free T4: 0.7 ng/dL — ABNORMAL LOW (ref 0.8–1.8)

## 2024-06-01 NOTE — Progress Notes (Signed)
 "    Cardiology Office Note Date:  06/01/2024  Patient ID:  Chelsea, Branch [birth year], MRN [id number] PCP:  Delbert Clam, MD  Cardiologist:  Dr. Fernande >> Dr. kennyth    Chief Complaint:   3 mo post implant visit  History of Present Illness: Chelsea Branch is a 59 y.o. 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 Fernande > Dr. Wendel  She was referred to Dr. Kennyth Coombe him 12/16/23 with an echo that noted recurrent reduction in LVEF, planned for single chamber primary prevention ICD (noting narrow QRS)  ICD implanted 01/30/24  Numerous calls, phone notes pain at sie, ER 9/10 Also reported SOB > morphine , Nebs CXR: NAD CTA:  1. No evidence of pulmonary embolism or other acute intrathoracic process. 2. 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 3. Mild anterolateral right lower lobe linear scarring and/or atelectasis. on call Suzann Riddle, NP with EP,  and discussed lab and imaging findings as well as pertinent plan - they recommend: they have seen the patient, reassurance given.   Wound check visit: well healed, normal findings  Further calls, fatigue, joint swelling, UCC visit 9/30, also needed refill on meds, had cut back on smoking Ddx: Dependent edema, fatigue, anxiety   Numerous calls /communication with her primary team for various things  She saw Dr. Zenaida 04/17/23, described class II symptoms, no med changes made  More calls/communication for non-cardiac issues, referrals, PMD team  TODAY  She is doing well No site/pocket concerns No CP, palpitations or cardiac awareness She mentions an palpitation that she feels at her left lower chest, random and infrequent, no associated symptoms No near syncope or syncope   Device  information BSCi single chamber ICD implanted 01/30/24  Arrhythmia hx None known + hx of cryptogenic stroke  Past Medical History:  Diagnosis Date   Abnormal thyroid  blood test 03/15/2017   Abnormal uterine bleeding    Alopecia    Anemia    Angina pectoris with normal coronary arteriogram 2017   Had + Troponin c/w ? 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 iniatially ?2/2 peripartum in 1994 - improved by 2008 then worsening EF in 2011 back down to EF 25-30%. b. echo 01/21/14 showed mod LVH, [name]; 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   Peripartum cardiomyopathy 1994   Sleep apnea 2015   CPAP 12/2013   Stroke (HCC) 2011   Systolic and diastolic CHF, acute on chronic (HCC) 06/14/2015   Tobacco abuse    Type 2 diabetes mellitus (HCC) 04/28/2023    Past Surgical History:  Procedure Laterality Date   CARDIAC CATHETERIZATION N/A 06/16/2015   Procedure: Left Heart Cath and Coronary Angiography;  Surgeon: Candyce GORMAN Reek, MD;  Location: [facility] INVASIVE CV LAB;  Service: Cardiovascular;  Laterality: N/A;   CESAREAN SECTION  1992  1994   ICD IMPLANT N/A 01/30/2024   Procedure: ICD IMPLANT;  Surgeon: Kennyth Chew, MD;  Location[facility] INVASIVE CV LAB;  Service: Cardiovascular;  Laterality: N/A;   INTRAUTERINE DEVICE (IUD) INSERTION     about 3 years ago   LOOP RECORDER IMPLANT  ~ 2000   spine injections       Neurosurgery,  Dr Darlis   TIBIAL TUBERCLERPLASTY     TRANSESOPHAGEAL ECHOCARDIOGRAM  (CATH LAB) N/A 04/20/2023   Procedure: TRANSESOPHAGEAL ECHOCARDIOGRAM;  Surgeon: Alvan Ronal BRAVO, MD;  Location: [facility] INVASIVE CV LAB;  Service: Cardiovascular;  Laterality: N/A;   TUBAL LIGATION  05/24/1992    Current Outpatient Medications  Medication Sig Dispense Refill   acetaminophen  (TYLENOL ) 500 MG tablet Take 1,000 mg by mouth as needed for mild pain (pain score 1-3) or moderate pain (pain score 4-6).     albuterol  (PROVENTIL ) (2.5 MG/3ML) 0.083% nebulizer solution Take 3 mLs (2.5 mg total) by nebulization every 6 (six) hours as needed for wheezing or shortness of breath. 75 mL 12   albuterol  (VENTOLIN  HFA) 108 (90 Base) MCG/ACT inhaler Inhale 2 puffs into the lungs every 4 (four) hours as needed for wheezing or shortness of breath. 18 g 1   aspirin  81 MG chewable tablet Chew 1 tablet (81 mg total) by mouth daily. 30 tablet 1   atorvastatin  (LIPITOR) 40 MG tablet Take 1 tablet (40 mg total) by mouth daily. 90 tablet 1   benzonatate  (TESSALON ) 200 MG capsule Take 1 capsule (200 mg total) by mouth 2 (two) times daily as needed for cough. 20 capsule 0   FLUoxetine  (PROZAC ) 20 MG capsule Take 1 capsule (20 mg total) by mouth daily. 90 capsule 1   fluticasone  (FLONASE ) 50 MCG/ACT nasal spray Place 2 sprays into both nostrils daily. 9.9 mL 2   HYDROcodone  bit-homatropine (HYCODAN) 5-1.5 MG/5ML syrup Take 5 mLs by mouth every 6 (six) hours as needed for cough. 120 mL 0   hydrOXYzine  (ATARAX ) 25 MG tablet Take 1 tablet (25 mg total) by mouth at bedtime as needed. 30 tablet 1   ketoconazole  (NIZORAL ) 2 % shampoo Apply 1 Application topically 2 (two) times a week. 120 mL 1   loratadine  (CLARITIN ) 10 MG tablet Take 1 tablet (10 mg total) by mouth daily. 30 tablet 11   methimazole  (TAPAZOLE ) 5 MG tablet Take 1 tablet (5 mg total) by mouth 2 (two) times daily. 180 tablet 1   metoprolol  succinate (TOPROL -XL) 50 MG 24 hr tablet Take 1.5 tablets (75 mg total) by mouth daily. Take with or immediately  following a meal. 135 tablet 1   Misc. Devices MISC Blood Pressure Monitor 1 each 0   polyethylene glycol (MIRALAX  / GLYCOLAX ) 17 g packet Take 17 g by mouth as needed.     polyethylene glycol powder (GLYCOLAX /MIRALAX ) 17 GM/SCOOP powder Dissolve 17 grams into water and drink by mouth every day. 3570 g 1   potassium chloride  SA (KLOR-CON  M20) 20 MEQ tablet Take 3 tablets (60 mEq total) by mouth daily. 180 tablet 1   sacubitril -valsartan  (ENTRESTO ) 49-51 MG Take 1 tablet by mouth 2 (two) times daily. 90 tablet 3   Semaglutide , 2 MG/DOSE, 8 MG/3ML SOPN Inject 2 mg as directed once a week. 3 mL 1   spironolactone  (ALDACTONE ) 25 MG tablet Take 1 tablet (25 mg total) by mouth daily. 90 tablet 1   torsemide  (DEMADEX ) 20 MG tablet Take 1  tablet (20 mg total) by mouth daily. 90 tablet 1   triamcinolone  cream (KENALOG ) 0.1 % Apply 1 Application topically 2 (two) times daily. 30 g 0   Vitamin D , Ergocalciferol , (DRISDOL ) 1.25 MG (50000 UNIT) CAPS capsule Take 1 capsule (50,000 Units total) by mouth every 7 (seven) days. 12 capsule 1   No current facility-administered medications for this visit.    Allergies:   Ace inhibitors and Jardiance  [empagliflozin ]   Social History:  The patient  reports that she quit smoking about 9 years ago. Her smoking use included cigarettes. She started smoking about 39 years ago. She has a 3 pack-year smoking history. She has never used smokeless tobacco. She reports that she does not drink alcohol and does not use drugs.   Family History:  The patient's family history includes Cancer in her maternal grandmother; Healthy in her mother; Hypertension in her sister; Stroke in her sister.  ROS:  Please see the history of present illness.  All other systems are reviewed and otherwise negative.   PHYSICAL EXAM:  VS:  There were no vitals taken for this visit. BMI: There is no height or weight on file to calculate BMI. Well nourished, well developed, in no acute distress   HEENT: normocephalic, atraumatic  Neck: no JVD, carotid bruits or masses Cardiac:   RRR; no significant murmurs, no rubs, or gallops Lungs:  CTA b/l, no wheezing, rhonchi or rales  Abd: soft, non-tender, obese MS: no deformity or atrophy Ext: no edema is appreciated Skin: warm and dry, no rash Neuro:  No gross deficits appreciated Psych: euthymic mood, full affect  ICD site: well healed, no erythema, edema, tethering or discomfort  EKG:  done today and reviewed by myself SR 88bpm inf/lat ST/T changes appear similar to prior  ICD interrogation done today and reviewed by myself Battery and lead measurements are good/stable No arrhythmias <1% VP Chronic output programmed   12/08/23: TTE  1. Left ventricular ejection fraction, by estimation, is 30 to 35%. The  left ventricle has moderately decreased function. The left ventricle  demonstrates global hypokinesis. The left ventricular internal cavity size  was moderately dilated. Left  ventricular diastolic parameters are consistent with Grade I diastolic  dysfunction (impaired relaxation).   2. Right ventricular systolic function is mildly reduced. The right  ventricular size is normal. Tricuspid regurgitation signal is inadequate  for assessing PA pressure.   3. Left atrial size was mild to moderately dilated.   4. The mitral valve is normal in structure. Mild mitral valve  regurgitation. No evidence of mitral stenosis.   5. The aortic valve is tricuspid. Aortic valve regurgitation is not  visualized. No aortic stenosis is present.   6. The inferior vena cava is normal in size with greater than 50%  respiratory variability, suggesting right atrial pressure of 3 mmHg.    07/07/2018: TTE  1. The left ventricle has normal systolic function with an ejection  fraction of 60-65%. The cavity size was normal. There is moderately  increased left ventricular wall thickness. Left ventricular diastolic  Doppler parameters are consistent  with  pseudonormalization No evidence of left ventricular regional wall motion  abnormalities.   2. The right ventricle has normal systolic function. The cavity was  normal. There is no increase in right ventricular wall thickness.   3. The mitral valve is normal in structure. No evidence of mitral valve  stenosis. No regurgitation.   4. The tricuspid valve is normal in structure.   5. The  aortic valve is tricuspid no stenosis of the aortic valve.   6. The pulmonic valve was normal in structure.   7. The aortic root and ascending aorta are normal in size and structure.   8. Right atrial pressure is estimated at 3 mmHg.   9. No complete TR doppler jet so unable to estimate PA systolic pressure.   03/29/17: TTE Study Conclusions - Left ventricle: The cavity size was normal. There was mild   concentric hypertrophy. Systolic function was mildly to   moderately reduced. The estimated ejection fraction was in the   range of 40% to 45%. Diffuse hypokinesis. Doppler parameters are   consistent with abnormal left ventricular relaxation (grade 1   diastolic dysfunction). - Left atrium: The atrium was mildly dilated.  06/15/16: TTE: LVEF 30-35% 06/15/15: TTE: LVEF 25-30% 01/21/14: TTE: LVEF 50-55% 07/31/13: TTE: LVEF 25-30%  06/16/15:L LHC There is severe left ventricular systolic dysfunction. No significant CAD. LVEDP elevated.   Recent Labs: 03/09/2024: ALT 34; BNP 76.0; BUN 12; Creatinine, Ser 0.81; Hemoglobin 13.7; Platelets 203; Potassium 3.6; Sodium 141 05/29/2024: TSH 9.67  07/06/2023: Chol/HDL Ratio 2.3; Cholesterol, Total 121; HDL 52; LDL Chol Calc (NIH) 55; Triglycerides 68   CrCl cannot be calculated (Patient's most recent lab result is older than the maximum 21 days allowed.).   Wt Readings from Last 3 Encounters:  05/29/24 212 lb 3.2 oz (96.3 kg)  05/16/24 218 lb (98.9 kg)  04/16/24 219 lb 6.4 oz (99.5 kg)     Other studies reviewed: Additional studies/records reviewed today  include: summarized above  ASSESSMENT AND PLAN:  ICD intact function Chronic output programmed  No phrenic/diaphragmatic stim Unable to reproduce her symptom  NICM 3. Chronic CHF    Appears well compensated Heart logic score is 10, looks baseline, no symptoms or exam findings to suggest volume OL C/w Dr. Jerold              Disposition:  remotes as usual, in clinic with EP in a year, sooner if needd   Current medicines are reviewed at length with the patient today.  The patient did not have any concerns regarding medicines.  Bonney Charlies Arthur, PA-C 06/01/2024 5:42 AM     [facility] [address] [place] [address] [phone] (office)  [phone] (fax)   "

## 2024-06-01 NOTE — Patient Instructions (Addendum)
 Medication Instructions:   Your physician recommends that you continue on your current medications as directed. Please refer to the Current Medication list given to you today.   *If you need a refill on your cardiac medications before your next appointment, please call your pharmacy*  Lab Work: NONE ORDERED  TODAY   If you have labs (blood work) drawn today and your tests are completely normal, you will receive your results only by: MyChart Message (if you have MyChart) OR A paper copy in the mail If you have any lab test that is abnormal or we need to change your treatment, we will call you to review the results.  Testing/Procedures: NONE ORDERED  TODAY    Follow-Up: At [facility], you and your health needs are our priority.  As part of our continuing mission to provide you with exceptional heart care, our providers are all part of one team.  This team includes your primary Cardiologist (physician) and Advanced Practice Providers or APPs (Physician Assistants and Nurse Practitioners) who all work together to provide you with the care you need, when you need it.  Your next appointment:   AS  SCHEDULED IN [facility]   WITH HF CLINIC   1 year(s)   Provider:   [name], MD or Charlies Arthur, PA-C    We recommend signing up for the patient portal called MyChart.  Sign up information is provided on this After Visit Summary.  MyChart is used to connect with patients for Virtual Visits (Telemedicine).  Patients are able to view lab/test results, encounter notes, upcoming appointments, etc.  Non-urgent messages can be sent to your provider as well.   To learn more about what you can do with MyChart, go to [email or web address].   Other Instructions

## 2024-06-02 ENCOUNTER — Ambulatory Visit: Payer: Self-pay | Admitting: Cardiology

## 2024-06-04 ENCOUNTER — Telehealth: Payer: Self-pay | Admitting: Family Medicine

## 2024-06-04 ENCOUNTER — Ambulatory Visit: Payer: Self-pay | Admitting: Endocrinology

## 2024-06-04 DIAGNOSIS — Z8673 Personal history of transient ischemic attack (TIA), and cerebral infarction without residual deficits: Secondary | ICD-10-CM

## 2024-06-04 DIAGNOSIS — E0590 Thyrotoxicosis, unspecified without thyrotoxic crisis or storm: Secondary | ICD-10-CM

## 2024-06-04 NOTE — Telephone Encounter (Signed)
 I do not write prescriptions for electric wheelchair.  If she is agreeable I can refer her to physical therapy for evaluation to determine if she qualifies.

## 2024-06-04 NOTE — Telephone Encounter (Signed)
 Copied from CRM #[id number]. Topic: Clinical - Order For Equipment >> Jun 04, 2024  4:23 PM   Hadassah PARAS wrote:  Reason for CRM: Pt is requested addtl information on how to obtain an a Mining Engineer wheelchair. Please advise pt on #[phone]

## 2024-06-04 NOTE — Telephone Encounter (Signed)
 Patient aware of results and recommendations.

## 2024-06-04 NOTE — Telephone Encounter (Signed)
-----   Message from Sudan Thapa, MD sent at 06/04/2024  1:41 PM EST ----- Please notify patient of labs reviewed she has low thyroid  hormone levels with elevated TSH and low free T4 and negative autoantibodies for Graves' disease.  I would like to stop methimazole .  Advise patient to stop methimazole .  Will recheck thyroid  function test prior to follow-up visit as scheduled in March.

## 2024-06-04 NOTE — Telephone Encounter (Signed)
Does patient qualify? ?

## 2024-06-05 NOTE — Telephone Encounter (Signed)
 LVM informing patient that referral can be placed if she wants.

## 2024-06-05 NOTE — Telephone Encounter (Signed)
 Copied from CRM [id number]. Topic: General - Other >> Jun 05, 2024 10:53 AM   Zebedee SAUNDERS wrote:  Reason for CRM: Pt returning Chelsea Branch, CMA call regarding message which was provided. Yes, pt does want referral to physical therapy for qualification for wheel chair.

## 2024-06-05 NOTE — Telephone Encounter (Signed)
 Referral has been placed.

## 2024-06-05 NOTE — Addendum Note (Signed)
 Addended by: Jazzlene Huot on: 06/05/2024 12:05 PM   Modules accepted: Orders

## 2024-06-05 NOTE — Telephone Encounter (Signed)
 Pt states that she would like referral to PT.

## 2024-06-05 NOTE — Telephone Encounter (Signed)
LVM informing patient that referral has been placed.

## 2024-06-11 ENCOUNTER — Other Ambulatory Visit: Payer: Self-pay | Admitting: Family Medicine

## 2024-06-11 DIAGNOSIS — E1169 Type 2 diabetes mellitus with other specified complication: Secondary | ICD-10-CM

## 2024-06-11 NOTE — Telephone Encounter (Unsigned)
 Copied from CRM [id number]. Topic: Clinical - Medication Refill >> Jun 11, 2024 11:47 AM Montie POUR wrote: Medication:  Semaglutide , 2 MG/DOSE, 8 MG/3ML SOPN  Has the patient contacted their pharmacy? Yes (Agent: If no, request that the patient contact the pharmacy for the refill. If patient does not wish to contact the pharmacy document the reason why and proceed with request.) (Agent: If yes, when and what did the pharmacy advise?) Pharmacy needs order to refill  This is the patient's preferred pharmacy:  [facility] [address], [address] [place] [address] Phone: [phone] Fax: [phone]  Is this the correct pharmacy for this prescription? Yes If no, delete pharmacy and type the correct one.   Has the prescription been filled recently? No  Is the patient out of the medication? Yes  Has the patient been seen for an appointment in the last year OR does the patient have an upcoming appointment? Yes  Can we respond through MyChart? No  Agent: Please be advised that Rx refills may take up to 3 business days. We ask that you follow-up with your pharmacy.

## 2024-06-12 ENCOUNTER — Ambulatory Visit

## 2024-06-12 ENCOUNTER — Other Ambulatory Visit: Payer: Self-pay

## 2024-06-12 DIAGNOSIS — I5042 Chronic combined systolic (congestive) and diastolic (congestive) heart failure: Secondary | ICD-10-CM | POA: Diagnosis not present

## 2024-06-12 MED ORDER — SEMAGLUTIDE (2 MG/DOSE) 8 MG/3ML ~~LOC~~ SOPN
2.0000 mg | PEN_INJECTOR | SUBCUTANEOUS | 1 refills | Status: AC
  Filled 2024-06-12 – 2024-06-27 (×2): qty 3, 28d supply, fill #0

## 2024-06-12 NOTE — Telephone Encounter (Signed)
 Requested Prescriptions  Pending Prescriptions Disp Refills   Semaglutide , 2 MG/DOSE, 8 MG/3ML SOPN 3 mL 1    Sig: Inject 2 mg as directed once a week.     Endocrinology:  Diabetes - GLP-1 Receptor Agonists - semaglutide  Passed - 06/12/2024 11:31 AM      Passed - HBA1C in normal range and within 180 days    HbA1c, POC (controlled diabetic range)  Date Value Ref Range Status  04/11/2024 6.8 0.0 - 7.0 % Final         Passed - Cr in normal range and within 360 days    Creat  Date Value Ref Range Status  04/06/2016 0.82 0.50 - 1.05 mg/dL Final    Comment:      For patients > or = 60 years of age: The upper reference limit for Creatinine is approximately 13% higher for people identified as African-American.      Creatinine, Ser  Date Value Ref Range Status  03/09/2024 0.81 0.57 - 1.00 mg/dL Final         Passed - Valid encounter within last 6 months    Recent Outpatient Visits           2 months ago Type 2 diabetes mellitus with other specified complication, without long-term current use of insulin (HCC)   [facility] [place] Delbert Clam, MD   3 months ago Bronchitis   [facility] [place] Delbert Clam, MD   3 months ago Other fatigue   [facility] [place] Delbert Clam, MD   4 months ago Acute bronchitis, unspecified organism   [facility] [place] Delbert Clam, MD   5 months ago Hyperthyroidism    [facility] [place] Delbert Clam, MD

## 2024-06-13 LAB — CUP PACEART REMOTE DEVICE CHECK
Battery Remaining Longevity: 174 mo
Battery Remaining Percentage: 100 %
Brady Statistic RV Percent Paced: 0 %
Date Time Interrogation Session: 20260120005000
HighPow Impedance: 82 Ohm
Implantable Lead Connection Status: 753985
Implantable Lead Implant Date: 20250908
Implantable Lead Location: 753860
Implantable Lead Model: 673
Implantable Lead Serial Number: 271980
Implantable Pulse Generator Implant Date: 20250908
Lead Channel Impedance Value: 486 Ohm
Lead Channel Pacing Threshold Amplitude: 0.6 V
Lead Channel Pacing Threshold Pulse Width: 0.4 ms
Lead Channel Setting Pacing Amplitude: 3.5 V
Lead Channel Setting Pacing Pulse Width: 0.4 ms
Lead Channel Setting Sensing Sensitivity: 0.5 mV
Pulse Gen Serial Number: 354500
Zone Setting Status: 755011

## 2024-06-15 ENCOUNTER — Telehealth: Payer: Self-pay

## 2024-06-15 NOTE — Patient Instructions (Addendum)
 [name] Rumble - I am sorry I was unable to reach you today for our scheduled appointment. I work with Newlin, Enobong, MD and am calling to support your healthcare needs. Please contact me at [phone] at your earliest convenience.  I have scheduled another appointment for 06/22/2024 at 2:30 pm. I look forward to speaking with you soon.   Thank you,  [name], RNCM Case Manager [facility], Population Health Direct Dial: [phone]

## 2024-06-15 NOTE — Progress Notes (Signed)
 Remote ICD Transmission

## 2024-06-17 ENCOUNTER — Ambulatory Visit: Payer: Self-pay | Admitting: Cardiology

## 2024-06-22 ENCOUNTER — Other Ambulatory Visit: Payer: Self-pay

## 2024-06-22 ENCOUNTER — Telehealth: Payer: Self-pay

## 2024-06-22 NOTE — Patient Instructions (Signed)
 [name] Rumble - I am sorry I was unable to reach you today for our scheduled appointment. I work with Newlin, Enobong, MD and am calling to support your healthcare needs. Please contact me at [phone] at your earliest convenience. I look forward to speaking with you soon.   Thank you,  [name], RNCM Case Manager [facility], Population Health Direct Dial: [phone]

## 2024-06-26 ENCOUNTER — Telehealth: Payer: Self-pay | Admitting: Internal Medicine

## 2024-06-26 ENCOUNTER — Other Ambulatory Visit: Payer: Self-pay | Admitting: Family Medicine

## 2024-06-26 ENCOUNTER — Other Ambulatory Visit: Payer: Self-pay

## 2024-06-26 DIAGNOSIS — J4520 Mild intermittent asthma, uncomplicated: Secondary | ICD-10-CM

## 2024-06-26 DIAGNOSIS — E1169 Type 2 diabetes mellitus with other specified complication: Secondary | ICD-10-CM

## 2024-06-26 DIAGNOSIS — I5032 Chronic diastolic (congestive) heart failure: Secondary | ICD-10-CM

## 2024-06-26 MED ORDER — METOPROLOL SUCCINATE ER 50 MG PO TB24
75.0000 mg | ORAL_TABLET | Freq: Every day | ORAL | 3 refills | Status: AC
  Filled 2024-06-26: qty 135, 90d supply, fill #0

## 2024-06-26 MED ORDER — SPIRONOLACTONE 25 MG PO TABS
25.0000 mg | ORAL_TABLET | Freq: Every day | ORAL | 3 refills | Status: AC
  Filled 2024-06-26: qty 90, 90d supply, fill #0

## 2024-06-26 NOTE — Telephone Encounter (Signed)
" °*  STAT* If patient is at the pharmacy, call can be transferred to refill team.   1. Which medications need to be refilled? (please list name of each medication and dose if known)  metoprolol  succinate (TOPROL -XL) 50 MG 24 hr tablet  spironolactone  (ALDACTONE ) 25 MG tablet    2. Would you like to learn more about the convenience, safety, & potential cost savings by using the [facility]? No    3. Are you open to using the [facility] (Type [facility]. no   4. Which pharmacy/location (including street and city if local pharmacy) is medication to be sent to? [facility]      5. Do they need a 30 day or 90 day supply? 90 day   Pt is completely out.   "

## 2024-06-27 ENCOUNTER — Other Ambulatory Visit: Payer: Self-pay

## 2024-06-27 MED ORDER — ALBUTEROL SULFATE HFA 108 (90 BASE) MCG/ACT IN AERS
2.0000 | INHALATION_SPRAY | RESPIRATORY_TRACT | 1 refills | Status: AC | PRN
  Filled 2024-06-27: qty 18, 16d supply, fill #0

## 2024-06-27 MED ORDER — SEMAGLUTIDE (2 MG/DOSE) 8 MG/3ML ~~LOC~~ SOPN: 2.0000 mg | PEN_INJECTOR | SUBCUTANEOUS | 0 refills | Status: AC

## 2024-06-27 NOTE — Telephone Encounter (Signed)
 Requested Prescriptions  Pending Prescriptions Disp Refills   Semaglutide , 2 MG/DOSE, 8 MG/3ML SOPN 3 mL 1    Sig: Inject 2 mg as directed once a week.     Endocrinology:  Diabetes - GLP-1 Receptor Agonists - semaglutide  Passed - 06/27/2024  3:09 PM      Passed - HBA1C in normal range and within 180 days    HbA1c, POC (controlled diabetic range)  Date Value Ref Range Status  04/11/2024 6.8 0.0 - 7.0 % Final         Passed - Cr in normal range and within 360 days    Creat  Date Value Ref Range Status  04/06/2016 0.82 0.50 - 1.05 mg/dL Final    Comment:      For patients > or = 60 years of age: The upper reference limit for Creatinine is approximately 13% higher for people identified as African-American.      Creatinine, Ser  Date Value Ref Range Status  03/09/2024 0.81 0.57 - 1.00 mg/dL Final         Passed - Valid encounter within last 6 months    Recent Outpatient Visits           2 months ago Type 2 diabetes mellitus with other specified complication, without long-term current use of insulin (HCC)   [facility] [facility] [place]. [facility] Delbert Clam, MD   3 months ago Bronchitis   [place] [facility] [place] [facility] Of [place]. [facility] Delbert Clam, MD   4 months ago Other fatigue   [place] [facility] [place] - [facility] Dept Of [place]. [facility] Delbert Clam, MD   4 months ago Acute bronchitis, unspecified organism   [place] [facility] [place] - [facility] Of [place]. [facility] Delbert Clam, MD   5 months ago Hyperthyroidism   [place] [facility] [place] - [facility] Dept Of [place]. [facility] Delbert, Clam, MD               albuterol  (PROVENTIL ) (2.5 MG/3ML) 0.083% nebulizer solution 75 mL 12    Sig: Take 3 mLs (2.5 mg total) by nebulization every 6 (six) hours as needed for wheezing or shortness of breath.     Pulmonology:  Beta Agonists 2 Failed - 06/27/2024   3:09 PM      Failed - Last BP in normal range    BP Readings from Last 1 Encounters:  06/01/24 (!) 128/90         Passed - Last Heart Rate in normal range    Pulse Readings from Last 1 Encounters:  06/01/24 88         Passed - Valid encounter within last 12 months    Recent Outpatient Visits           2 months ago Type 2 diabetes mellitus with other specified complication, without long-term current use of insulin (HCC)   [place] [facility] [place] [facility] [place]. [facility] Delbert Clam, MD   3 months ago Bronchitis   [place] [facility] [place] - [facility] Of [place]. [facility] Delbert Clam, MD   4 months ago Other fatigue   [place] [facility] [place] - [facility] Dept Of [place]. [facility] Delbert Clam, MD   4 months ago Acute bronchitis, unspecified organism   [facility] - A Dept Of [place]. [facility] Delbert Clam, MD   5 months ago Hyperthyroidism   [place] [facility] [place] - [facility] Of [place]. [facility] Delbert, Clam, MD               albuterol  (VENTOLIN  HFA) 108 [phone] Base) MCG/ACT inhaler 18 g 1    Sig: Inhale 2 puffs into the lungs every 4 (four) hours as needed for wheezing or shortness of breath.     Pulmonology:  Beta Agonists 2 Failed - 06/27/2024  3:09 PM      Failed - Last BP in normal range    BP Readings from Last 1 Encounters:  06/01/24 (!) 128/90         Passed - Last Heart Rate in normal range    Pulse Readings from Last 1 Encounters:  06/01/24 88         Passed - Valid encounter within last 12 months    Recent Outpatient Visits           2 months ago Type 2 diabetes mellitus with other specified complication, without long-term current use of insulin (HCC)   [place] [facility] [place] [facility] [place]. [facility] Delbert Clam, MD   3 months ago Bronchitis   [place] [facility] [place] - [facility] Of [place].  [facility] Delbert Clam, MD   4 months ago Other fatigue   [place] [facility] [place] - A Dept Of [place]. [facility] Delbert Clam, MD   4 months ago Acute bronchitis, unspecified organism   [place] [facility] [place] - A Dept Of [place]. [facility] Delbert Clam, MD   5 months ago Hyperthyroidism    [facility] [place] - A Dept Of [place]. [facility] Delbert Clam, MD

## 2024-06-27 NOTE — Telephone Encounter (Signed)
 Requested Prescriptions  Pending Prescriptions Disp Refills   Semaglutide , 2 MG/DOSE, 8 MG/3ML SOPN 3 mL 1    Sig: Inject 2 mg as directed once a week.     Endocrinology:  Diabetes - GLP-1 Receptor Agonists - semaglutide  Passed - 06/27/2024  3:11 PM      Passed - HBA1C in normal range and within 180 days    HbA1c, POC (controlled diabetic range)  Date Value Ref Range Status  04/11/2024 6.8 0.0 - 7.0 % Final         Passed - Cr in normal range and within 360 days    Creat  Date Value Ref Range Status  04/06/2016 0.82 0.50 - 1.05 mg/dL Final    Comment:      For patients > or = 60 years of age: The upper reference limit for Creatinine is approximately 13% higher for people identified as African-American.      Creatinine, Ser  Date Value Ref Range Status  03/09/2024 0.81 0.57 - 1.00 mg/dL Final         Passed - Valid encounter within last 6 months    Recent Outpatient Visits           2 months ago Type 2 diabetes mellitus with other specified complication, without long-term current use of insulin (HCC)   [place] [facility] Of [place]. [facility] Delbert Clam, MD   3 months ago Bronchitis   [place] [facility] [place] - A Dept Of [place]. [facility] Delbert Clam, MD   4 months ago Other fatigue   [place] [facility] [place] - A Dept Of [place]. [facility] Delbert Clam, MD   4 months ago Acute bronchitis, unspecified organism   [place] [facility] [place] - A Dept Of [place]. [facility] Delbert Clam, MD   5 months ago Hyperthyroidism   [place] [facility] [place] - A Dept Of [place]. [facility] Delbert Clam, MD              Signed Prescriptions Disp Refills   albuterol  (VENTOLIN  HFA) 108 (90 Base) MCG/ACT inhaler 18 g 1    Sig: Inhale 2 puffs into the lungs every 4 (four) hours as needed for wheezing or shortness of breath.     Pulmonology:  Beta Agonists 2  Failed - 06/27/2024  3:11 PM      Failed - Last BP in normal range    BP Readings from Last 1 Encounters:  06/01/24 (!) 128/90         Passed - Last Heart Rate in normal range    Pulse Readings from Last 1 Encounters:  06/01/24 88         Passed - Valid encounter within last 12 months    Recent Outpatient Visits           2 months ago Type 2 diabetes mellitus with other specified complication, without long-term current use of insulin (HCC)   [place] [facility] [place] - [facility] Dept Of [place]. [facility] Delbert Clam, MD   3 months ago Bronchitis   [place] [facility] [place] - [facility] Dept Of [place]. [facility] Delbert Clam, MD   4 months ago Other fatigue    [facility] [place] - A Dept Of [place]. [facility] Delbert Clam, MD   4 months ago Acute bronchitis, unspecified organism  [place] [facility] [place] - A Dept Of [place]. [facility] Delbert Clam, MD   5 months ago Hyperthyroidism   [place] [facility] [place] - A Dept Of [place]. [facility] Delbert Clam, MD              Refused Prescriptions Disp Refills   albuterol  (PROVENTIL ) (2.5 MG/3ML) 0.083% nebulizer solution 75 mL 12    Sig: Take 3 mLs (2.5 mg total) by nebulization every 6 (six) hours as needed for wheezing or shortness of breath.     Pulmonology:  Beta Agonists 2 Failed - 06/27/2024  3:11 PM      Failed - Last BP in normal range    BP Readings from Last 1 Encounters:  06/01/24 (!) 128/90         Passed - Last Heart Rate in normal range    Pulse Readings from Last 1 Encounters:  06/01/24 88         Passed - Valid encounter within last 12 months    Recent Outpatient Visits           2 months ago Type 2 diabetes mellitus with other specified complication, without long-term current use of insulin (HCC)   [place] [facility] [place] - [facility] Of [place]. [facility] Delbert Clam, MD   3 months  ago Bronchitis   [place] [facility] [place] - [facility] Dept Of [place]. [facility] Delbert Clam, MD   4 months ago Other fatigue   [place] [facility] [place] - A Dept Of [place]. [facility] Delbert Clam, MD   4 months ago Acute bronchitis, unspecified organism   [place] [facility] [place] - A Dept Of [place]. [facility] Delbert Clam, MD   5 months ago Hyperthyroidism   [place] [facility] [place] - A Dept Of [place]. [facility] Delbert Clam, MD

## 2024-06-28 ENCOUNTER — Other Ambulatory Visit: Payer: Self-pay

## 2024-06-28 DIAGNOSIS — E1169 Type 2 diabetes mellitus with other specified complication: Secondary | ICD-10-CM

## 2024-06-28 DIAGNOSIS — Z5900 Homelessness unspecified: Secondary | ICD-10-CM

## 2024-06-28 NOTE — Addendum Note (Signed)
 Addended by: VIVECA BARNACLE B on: 06/28/2024 12:12 PM   Modules accepted: Orders

## 2024-06-28 NOTE — Patient Outreach (Signed)
 Complex Care Management   Visit Note  06/28/2024  Name:  Chelsea Branch MRN: [id number] DOB: [birth year]  Situation: Referral received for Complex Care Management related to Heart Failure and DMII I obtained verbal consent from Patient.  Visit completed with Ms. Gulbranson  on the phone  Background:   Past Medical History:  Diagnosis Date   Abnormal thyroid  blood test 03/15/2017   Abnormal uterine bleeding    Alopecia    Anemia    Angina pectoris with normal coronary arteriogram 2017   Had + Troponin c/w ? 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 iniatially ?2/2 peripartum in 1994 - improved by 2008 then worsening EF in 2011 back down to EF 25-30%. b. echo 01/21/14 showed mod LVH, [name]; 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   Peripartum cardiomyopathy 1994   Sleep apnea 2015   CPAP 12/2013   Stroke (HCC) 2011   Systolic and diastolic CHF, acute on chronic (HCC) 06/14/2015   Tobacco abuse    Type 2 diabetes mellitus (HCC) 04/28/2023    Assessment: Patient is currently living with her mother and staying with others at times. Sent her a text through Epic so she can get access to [employer]. She is NOT taking her BS daily and is NOT weighing daily. Her last weight was 211. Ozempic  dose increased and this is curbing her appetite. She is trying to eat healthy foods. Discussed how important monitoring her weight, BS and BP daily at home can benefit her by identifying she she is getting in trouble with helath and  providing in to her MDs can provide valuable insight on her health. Will work with her to get on track with routine. Currently she is feeling down and misses work and getting out like she used to. Her 3 sons call and visit often and they are supportive. Discussed SW referral for housing needs and overall stress and feeling down. She is very open to talking with LCSW.    Patient Reported Symptoms:  Cognitive Cognitive Status: Alert and oriented to person, place, and time, Normal speech and language skills, Insightful and able to interpret abstract concepts Cognitive/Intellectual Conditions Management [RPT]: None reported or documented in medical history or problem list   Health Maintenance Behaviors: Annual physical exam, Stress management Health Facilitated by: Pain control, Stress management, Rest, Healthy diet  Neurological Neurological Review of Symptoms: No symptoms reported Neurological Self-Management Outcome: 4 (good)  HEENT HEENT Symptoms Reported: No symptoms reported HEENT Self-Management Outcome: 4 (good)    Cardiovascular Cardiovascular Symptoms Reported: No symptoms reported Does patient have uncontrolled Hypertension?: No Cardiovascular Management Strategies: Adequate rest, Coping strategies, Routine screening, Medication therapy Cardiovascular Self-Management Outcome: 4 (good)  Respiratory Respiratory Symptoms Reported: No symptoms reported Respiratory Management Strategies: Adequate rest, Routine screening Respiratory Self-Management Outcome: 4 (good)  Endocrine Endocrine Symptoms Reported: No symptoms reported Is patient diabetic?: Yes Is patient checking blood sugars at home?: No (She is not checking her blood sugar at home) Endocrine  Self-Management Outcome: 4 (good)  Gastrointestinal Gastrointestinal Symptoms Reported: Change in appetite Additional Gastrointestinal Details: Taking Ozempic  and reports is is curbing her appetite so she is not eating as  much Gastrointestinal Management Strategies: Diet modification, Medication therapy Gastrointestinal Self-Management Outcome: 4 (good)    Genitourinary Genitourinary Symptoms Reported: No symptoms reported    Integumentary Integumentary Symptoms Reported: No symptoms reported    Musculoskeletal Musculoskelatal Symptoms Reviewed: Unsteady gait Additional Musculoskeletal Details: uses a rollator - tires easily and history of falls Musculoskeletal Management Strategies: Medical device, Routine screening, Coping strategies, Adequate rest Musculoskeletal Self-Management Outcome: 4 (good) Falls in the past year?: No Number of falls in past year: 1 or less Was there an injury with Fall?: No Fall Risk Category Calculator: 0 Patient Fall Risk Level: Low Fall Risk    Psychosocial Psychosocial Symptoms Reported: Other Other Psychosocial Conditions: Difficulty getting motivated not used to not being able to work and get out like she wants. She is currently living with her mother bt wants to try and get her own place. Additional Psychological Details: Expressed she would like to go to schoola nd get her GED Behavioral Management Strategies: Coping strategies Behavioral Health Self-Management Outcome: 3 (uncertain) Major Change/Loss/Stressor/Fears (CP): Environment, School or job Quality of Family Relationships: supportive, helpful (Has 3 sons that visit and call regularly)    06/28/2024    PHQ2-9 Depression Screening   Little interest or pleasure in doing things Several days  Feeling down, depressed, or hopeless More than half the days  PHQ-2 - Total Score 3  Trouble falling or staying asleep, or sleeping too much More than half the days  Feeling tired or having little energy Several days  Poor appetite or overeating  Not at all  Feeling bad about yourself - or that you are a failure or have let yourself or your family down Several days  Trouble concentrating on things, such as reading the  newspaper or watching television Several days  Moving or speaking so slowly that other people could have noticed.  Or the opposite - being so fidgety or restless that you have been moving around a lot more than usual Several days  Thoughts that you would be better off dead, or hurting yourself in some way Not at all  PHQ2-9 Total Score 9  If you checked off any problems, how difficult have these problems made it for you to do your work, take care of things at home, or get along with other people Somewhat difficult  Depression Interventions/Treatment      There were no vitals filed for this visit. Pain Scale: 0-10 Pain Score: 0-No pain  Medications Reviewed Today   Medications were not reviewed in this encounter     Recommendation:   Referral to: SW for housing and overall feeling down  Continue Current Plan of Care Use Text link sent to your phone and access your MyChart Review Written Education on Heart Failure Action Plan once in MyChart  Follow Up Plan:   Telephone follow up appointment date/time:  07/19/2024 at 10:45 am  [name], [facility] Case Manager [facility], Population Health Direct Dial: [phone]

## 2024-06-28 NOTE — Patient Instructions (Signed)
 Visit Information  Chelsea Branch was given information about Medicaid Managed Care team care coordination services as a part of their [facility] Community Plan Medicaid benefit.   If you would like to schedule transportation through your [facility], please call the following number at least 2 days in advance of your appointment: [phone]   Rides for urgent appointments can also be made after hours by calling Member Services.  Call the Behavioral Health Crisis Line at [phone], at any time, 24 hours a day, 7 days a week. If you are in danger or need immediate medical attention call 911.  Please see education materials related to Heart Failure provided by MyChart link.  Care plan and visit instructions communicated with the patient verbally today. Patient agrees to receive a copy in MyChart. Active MyChart status and patient understanding of how to access instructions and care plan via MyChart confirmed with patient.     Telephone follow up appointment with Managed Medicaid care management team member scheduled for:07/19/2024 at 10:45 am  Chelsea Branch, [facility] Case Manager [facility], Population Health Direct Dial: [phone]   Following is a copy of your plan of care:   Goals Addressed             This Visit's Progress    VBCI RN Care Plan-CHF       Problems:  Chronic Disease Management support and education needs related to CHF/DM Knowledge Deficits related to CHF/DM Diabetic with a last read on A1C 6.8% 04/11/2024. Patient continues to report she is not monitoring her diabetes but consumes a healthy diet Goal: Over the next 60 days the Patient will continue to work with RN Care Manager  to address care management and care coordination needs related to CHF as evidenced by adherence to care management team scheduled appointments     demonstrate Improved adherence to prescribed treatment plan for CHF as evidenced by  patient reporting and chart review demonstrate Improved health management independence as evidenced by patient reporting and chart review       Continue to encouraged pt to take all medications exactly as prescribed and will call provider for medication related questions as evidenced by patient reporting and chart review  RNCM continue to verbalize basic understanding of CHF disease process and self health management  Patient continues to be encourage pt to follow lowering her dietary intake on sugar limiting drinks high in sugar and dietary monitoring with low carbohydrates related to her diabetes.  Interventions:   Heart Failure Interventions: Basic overview and discussion of pathophysiology of Heart Failure reviewed Provided education on low sodium diet Assessed need for readable accurate scales in home Provided education about placing scale on hard, flat surface Advised patient to weigh each morning after emptying bladder Discussed importance of daily weight and advised patient to weigh and record daily Reviewed role of diuretics in prevention of fluid overload and management of heart failure; Discussed the importance of keeping all appointments with provider Provided patient with education about the role of exercise in the management of heart failure Advised patient to discuss obtaining a scale with provider Educated pt to lower her dietary intake on sugar limiting drinks high in sugar and dietary monitoring with low carbohydrates. Pt aware to report to her provider any increase fatigue/weakness, thrist/hunger, dizziness,blurred vision, or increase in urination educating pt that this is the YELLOW for action with her diabetes if encountered.  Verified pt remains in the green zone today. Much  encouragement for daily weights. UPDATE 06/28/2024: Patient is currently living with her mother and staying with others at times. Sent her a text through Epic so she can get access to [employer]. She is NOT  taking her BS daily and is NOT weighing daily. Ozempic  dose increased and this is curbing her appetite. She is trying to eat healthy foods. Discussed how important monitoring her weight, BS and BP daily at home can benefit her by identifying she she is getting in trouble with helath and providing in to her MDs can provide valuable insight on her health. Will work with ehr to get on track with routine. She denies any SOB or lower extremity edema.   Patient Self-Care Activities:  Attend all scheduled provider appointments Attend church or other social activities Call provider office for new concerns or questions  Perform all self care activities independently  Perform IADL's (shopping, preparing meals, housekeeping, managing finances) independently Take medications as prescribed   call office if I gain more than 2 pounds in one day or 5 pounds in one week do ankle pumps when sitting keep legs up while sitting and adhere to the compression stocking discussed today use salt in moderation watch for swelling in feet, ankles and legs every day and continue using there request compression stockings as recommended from her provider weigh daily or as often as possible based upon her week appointments at providers offices. follow rescue plan if symptoms flare-up eat more whole grains, fruits and vegetables, lean meats and healthy fats track symptoms and what helps feel better or worse dress right for the weather, hot or cold Report to her provider any symptoms of increase fatigue/weakness, thrist/hunger, dizziness, and blurred vision that maybe related to her diabetes.  Plan:  Telephone follow up appointment with care management team member scheduled for:  07/19/2024 @ 10:45 am

## 2024-06-29 ENCOUNTER — Telehealth: Payer: Self-pay | Admitting: *Deleted

## 2024-06-29 NOTE — Progress Notes (Signed)
 Complex Care Management Care Guide Note  06/29/2024 Name: Chelsea Branch MRN: [id number] DOB: 12-Oct-1965  [name] is a 59 y.o. year old female who is a primary care patient of Newlin, Enobong, MD and is actively engaged with the care management team. I reached out to [name] Rumble by phone today to assist with scheduling  with the BSW.  Follow up plan: Telephone appointment with complex care management team member scheduled for:  07/11/24  [name]  [facility] Health  [facility], [facility] Guide  Direct Dial: [phone]  Fax [phone]

## 2024-07-11 ENCOUNTER — Telehealth

## 2024-07-19 ENCOUNTER — Telehealth

## 2024-07-23 ENCOUNTER — Other Ambulatory Visit

## 2024-07-30 ENCOUNTER — Ambulatory Visit: Admitting: Endocrinology

## 2024-08-07 ENCOUNTER — Ambulatory Visit (HOSPITAL_COMMUNITY)

## 2024-09-11 ENCOUNTER — Encounter

## 2024-10-09 ENCOUNTER — Ambulatory Visit: Admitting: Family Medicine

## 2024-12-11 ENCOUNTER — Encounter

## 2025-03-12 ENCOUNTER — Encounter
